# Patient Record
Sex: Male | Born: 1960
Health system: Southern US, Community
[De-identification: ages and names within clinical notes are randomized; demographics above are authoritative.]

## PROBLEM LIST (undated history)

## (undated) DIAGNOSIS — D649 Anemia, unspecified: Secondary | ICD-10-CM

## (undated) DIAGNOSIS — R011 Cardiac murmur, unspecified: Secondary | ICD-10-CM

## (undated) DIAGNOSIS — I2510 Atherosclerotic heart disease of native coronary artery without angina pectoris: Secondary | ICD-10-CM

## (undated) DIAGNOSIS — E119 Type 2 diabetes mellitus without complications: Secondary | ICD-10-CM

## (undated) DIAGNOSIS — M109 Gout, unspecified: Secondary | ICD-10-CM

## (undated) DIAGNOSIS — I219 Acute myocardial infarction, unspecified: Secondary | ICD-10-CM

## (undated) DIAGNOSIS — T7840XA Allergy, unspecified, initial encounter: Secondary | ICD-10-CM

## (undated) DIAGNOSIS — I10 Essential (primary) hypertension: Secondary | ICD-10-CM

## (undated) DIAGNOSIS — F419 Anxiety disorder, unspecified: Secondary | ICD-10-CM

## (undated) DIAGNOSIS — I639 Cerebral infarction, unspecified: Secondary | ICD-10-CM

## (undated) DIAGNOSIS — K219 Gastro-esophageal reflux disease without esophagitis: Secondary | ICD-10-CM

## (undated) DIAGNOSIS — E7800 Pure hypercholesterolemia, unspecified: Secondary | ICD-10-CM

## (undated) DIAGNOSIS — M7120 Synovial cyst of popliteal space [Baker], unspecified knee: Secondary | ICD-10-CM

## (undated) DIAGNOSIS — Z5189 Encounter for other specified aftercare: Secondary | ICD-10-CM

## (undated) HISTORY — PX: KNEE ARTHROSCOPY: SHX127

## (undated) HISTORY — DX: Cardiac murmur, unspecified: R01.1

## (undated) HISTORY — DX: Anxiety disorder, unspecified: F41.9

## (undated) HISTORY — DX: Encounter for other specified aftercare: Z51.89

## (undated) HISTORY — PX: FINGER AMPUTATION: SHX636

## (undated) HISTORY — DX: Allergy, unspecified, initial encounter: T78.40XA

---

## 1998-01-07 ENCOUNTER — Encounter: Admission: RE | Admit: 1998-01-07 | Discharge: 1998-01-07 | Payer: Self-pay | Admitting: Internal Medicine

## 1998-04-20 ENCOUNTER — Emergency Department (HOSPITAL_COMMUNITY): Admission: EM | Admit: 1998-04-20 | Discharge: 1998-04-20 | Payer: Self-pay | Admitting: Emergency Medicine

## 1999-10-11 ENCOUNTER — Emergency Department (HOSPITAL_COMMUNITY): Admission: EM | Admit: 1999-10-11 | Discharge: 1999-10-11 | Payer: Self-pay | Admitting: Emergency Medicine

## 2001-06-26 ENCOUNTER — Encounter: Payer: Self-pay | Admitting: Emergency Medicine

## 2001-06-26 ENCOUNTER — Emergency Department (HOSPITAL_COMMUNITY): Admission: EM | Admit: 2001-06-26 | Discharge: 2001-06-26 | Payer: Self-pay | Admitting: Emergency Medicine

## 2002-09-06 HISTORY — PX: COLONOSCOPY: SHX174

## 2003-02-27 ENCOUNTER — Emergency Department (HOSPITAL_COMMUNITY): Admission: EM | Admit: 2003-02-27 | Discharge: 2003-02-28 | Payer: Self-pay | Admitting: Emergency Medicine

## 2003-02-27 ENCOUNTER — Encounter: Payer: Self-pay | Admitting: Emergency Medicine

## 2003-03-04 ENCOUNTER — Emergency Department (HOSPITAL_COMMUNITY): Admission: EM | Admit: 2003-03-04 | Discharge: 2003-03-04 | Payer: Self-pay | Admitting: Emergency Medicine

## 2003-09-23 ENCOUNTER — Emergency Department (HOSPITAL_COMMUNITY): Admission: EM | Admit: 2003-09-23 | Discharge: 2003-09-23 | Payer: Self-pay | Admitting: Emergency Medicine

## 2003-09-24 ENCOUNTER — Emergency Department (HOSPITAL_COMMUNITY): Admission: EM | Admit: 2003-09-24 | Discharge: 2003-09-25 | Payer: Self-pay | Admitting: Emergency Medicine

## 2007-02-01 ENCOUNTER — Ambulatory Visit: Payer: Self-pay | Admitting: *Deleted

## 2007-05-01 ENCOUNTER — Ambulatory Visit: Payer: Self-pay | Admitting: Family Medicine

## 2007-05-01 ENCOUNTER — Encounter (INDEPENDENT_AMBULATORY_CARE_PROVIDER_SITE_OTHER): Payer: Self-pay | Admitting: Nurse Practitioner

## 2007-05-01 LAB — CONVERTED CEMR LAB
AST: 30 units/L (ref 0–37)
Albumin: 4.6 g/dL (ref 3.5–5.2)
Alkaline Phosphatase: 67 units/L (ref 39–117)
Basophils Absolute: 0 10*3/uL (ref 0.0–0.1)
Basophils Relative: 1 % (ref 0–1)
Chloride: 102 meq/L (ref 96–112)
LDL Cholesterol: 183 mg/dL — ABNORMAL HIGH (ref 0–99)
Lymphocytes Relative: 54 % — ABNORMAL HIGH (ref 12–46)
MCHC: 33.7 g/dL (ref 30.0–36.0)
Monocytes Relative: 10 % (ref 3–11)
Neutro Abs: 2.1 10*3/uL (ref 1.7–7.7)
Neutrophils Relative %: 33 % — ABNORMAL LOW (ref 43–77)
Potassium: 4.4 meq/L (ref 3.5–5.3)
RBC: 5.38 M/uL (ref 4.22–5.81)
RDW: 14.3 % — ABNORMAL HIGH (ref 11.5–14.0)
Sodium: 139 meq/L (ref 135–145)
TSH: 1.204 microintl units/mL (ref 0.350–5.50)
Total Protein: 7.8 g/dL (ref 6.0–8.3)

## 2007-05-15 DIAGNOSIS — Z9889 Other specified postprocedural states: Secondary | ICD-10-CM

## 2007-05-15 DIAGNOSIS — I10 Essential (primary) hypertension: Secondary | ICD-10-CM | POA: Insufficient documentation

## 2007-05-15 DIAGNOSIS — K219 Gastro-esophageal reflux disease without esophagitis: Secondary | ICD-10-CM | POA: Insufficient documentation

## 2007-05-16 ENCOUNTER — Encounter (INDEPENDENT_AMBULATORY_CARE_PROVIDER_SITE_OTHER): Payer: Self-pay | Admitting: Nurse Practitioner

## 2007-05-16 ENCOUNTER — Ambulatory Visit: Payer: Self-pay | Admitting: Internal Medicine

## 2007-05-16 DIAGNOSIS — H547 Unspecified visual loss: Secondary | ICD-10-CM

## 2007-05-16 DIAGNOSIS — M719 Bursopathy, unspecified: Secondary | ICD-10-CM

## 2007-05-16 DIAGNOSIS — M67919 Unspecified disorder of synovium and tendon, unspecified shoulder: Secondary | ICD-10-CM | POA: Insufficient documentation

## 2007-05-16 DIAGNOSIS — E7800 Pure hypercholesterolemia, unspecified: Secondary | ICD-10-CM

## 2007-05-30 ENCOUNTER — Ambulatory Visit: Payer: Self-pay | Admitting: Nurse Practitioner

## 2007-05-30 DIAGNOSIS — M25519 Pain in unspecified shoulder: Secondary | ICD-10-CM | POA: Insufficient documentation

## 2007-05-30 LAB — CONVERTED CEMR LAB
Cholesterol, target level: 200 mg/dL
HDL goal, serum: 40 mg/dL
LDL Goal: 130 mg/dL

## 2007-06-29 ENCOUNTER — Ambulatory Visit: Payer: Self-pay | Admitting: Nurse Practitioner

## 2007-06-29 DIAGNOSIS — F528 Other sexual dysfunction not due to a substance or known physiological condition: Secondary | ICD-10-CM | POA: Insufficient documentation

## 2007-08-01 ENCOUNTER — Ambulatory Visit: Payer: Self-pay | Admitting: Nurse Practitioner

## 2007-08-01 DIAGNOSIS — K5900 Constipation, unspecified: Secondary | ICD-10-CM | POA: Insufficient documentation

## 2007-10-02 ENCOUNTER — Ambulatory Visit: Payer: Self-pay | Admitting: Nurse Practitioner

## 2007-10-16 ENCOUNTER — Ambulatory Visit: Payer: Self-pay | Admitting: Nurse Practitioner

## 2008-07-08 ENCOUNTER — Ambulatory Visit: Payer: Self-pay | Admitting: Nurse Practitioner

## 2008-07-08 DIAGNOSIS — E669 Obesity, unspecified: Secondary | ICD-10-CM | POA: Insufficient documentation

## 2008-07-08 DIAGNOSIS — M79609 Pain in unspecified limb: Secondary | ICD-10-CM | POA: Insufficient documentation

## 2008-07-09 DIAGNOSIS — R7989 Other specified abnormal findings of blood chemistry: Secondary | ICD-10-CM | POA: Insufficient documentation

## 2008-07-09 LAB — CONVERTED CEMR LAB
ALT: 43 units/L (ref 0–53)
AST: 71 units/L — ABNORMAL HIGH (ref 0–37)
Basophils Absolute: 0 10*3/uL (ref 0.0–0.1)
Calcium: 9.7 mg/dL (ref 8.4–10.5)
Chloride: 98 meq/L (ref 96–112)
Creatinine, Ser: 0.99 mg/dL (ref 0.40–1.50)
Eosinophils Relative: 3 % (ref 0–5)
Lymphocytes Relative: 47 % — ABNORMAL HIGH (ref 12–46)
Neutro Abs: 2.9 10*3/uL (ref 1.7–7.7)
Neutrophils Relative %: 38 % — ABNORMAL LOW (ref 43–77)
Platelets: 278 10*3/uL (ref 150–400)
Potassium: 4.4 meq/L (ref 3.5–5.3)
RDW: 14.7 % (ref 11.5–15.5)
TSH: 1.227 microintl units/mL (ref 0.350–4.50)
Total CHOL/HDL Ratio: 14.1
Uric Acid, Serum: 10.4 mg/dL — ABNORMAL HIGH (ref 4.0–7.8)

## 2008-10-30 ENCOUNTER — Emergency Department (HOSPITAL_COMMUNITY): Admission: EM | Admit: 2008-10-30 | Discharge: 2008-10-30 | Payer: Self-pay | Admitting: Family Medicine

## 2008-11-28 ENCOUNTER — Ambulatory Visit: Payer: Self-pay | Admitting: Nurse Practitioner

## 2008-11-28 DIAGNOSIS — M25569 Pain in unspecified knee: Secondary | ICD-10-CM

## 2008-11-29 ENCOUNTER — Ambulatory Visit (HOSPITAL_COMMUNITY): Admission: RE | Admit: 2008-11-29 | Discharge: 2008-11-29 | Payer: Self-pay | Admitting: Nurse Practitioner

## 2008-11-29 LAB — CONVERTED CEMR LAB
ALT: 23 units/L (ref 0–53)
AST: 37 units/L (ref 0–37)
Basophils Absolute: 0 10*3/uL (ref 0.0–0.1)
Basophils Relative: 0 % (ref 0–1)
Creatinine, Ser: 1.32 mg/dL (ref 0.40–1.50)
MCHC: 34.4 g/dL (ref 30.0–36.0)
Neutro Abs: 3.5 10*3/uL (ref 1.7–7.7)
Neutrophils Relative %: 39 % — ABNORMAL LOW (ref 43–77)
Platelets: 298 10*3/uL (ref 150–400)
RBC: 4.86 M/uL (ref 4.22–5.81)
RDW: 14.4 % (ref 11.5–15.5)
Sed Rate: 27 mm/hr — ABNORMAL HIGH (ref 0–16)
Total Bilirubin: 0.3 mg/dL (ref 0.3–1.2)
Total CHOL/HDL Ratio: 11.3
Uric Acid, Serum: 11.6 mg/dL — ABNORMAL HIGH (ref 4.0–7.8)

## 2008-12-10 ENCOUNTER — Ambulatory Visit (HOSPITAL_COMMUNITY): Admission: RE | Admit: 2008-12-10 | Discharge: 2008-12-10 | Payer: Self-pay | Admitting: Family Medicine

## 2008-12-10 ENCOUNTER — Ambulatory Visit: Payer: Self-pay | Admitting: Family Medicine

## 2008-12-10 DIAGNOSIS — R109 Unspecified abdominal pain: Secondary | ICD-10-CM

## 2008-12-10 LAB — CONVERTED CEMR LAB: Blood Glucose, Fingerstick: 134

## 2009-01-08 ENCOUNTER — Observation Stay (HOSPITAL_COMMUNITY): Admission: EM | Admit: 2009-01-08 | Discharge: 2009-01-09 | Payer: Self-pay | Admitting: Emergency Medicine

## 2009-01-08 ENCOUNTER — Ambulatory Visit: Payer: Self-pay | Admitting: Vascular Surgery

## 2009-01-08 ENCOUNTER — Encounter (INDEPENDENT_AMBULATORY_CARE_PROVIDER_SITE_OTHER): Payer: Self-pay | Admitting: Emergency Medicine

## 2009-07-03 ENCOUNTER — Telehealth (INDEPENDENT_AMBULATORY_CARE_PROVIDER_SITE_OTHER): Payer: Self-pay | Admitting: Nurse Practitioner

## 2009-10-01 ENCOUNTER — Ambulatory Visit: Payer: Self-pay | Admitting: Nurse Practitioner

## 2009-10-01 DIAGNOSIS — L723 Sebaceous cyst: Secondary | ICD-10-CM

## 2009-10-14 ENCOUNTER — Ambulatory Visit: Payer: Self-pay | Admitting: Internal Medicine

## 2009-10-14 ENCOUNTER — Encounter (INDEPENDENT_AMBULATORY_CARE_PROVIDER_SITE_OTHER): Payer: Self-pay | Admitting: Nurse Practitioner

## 2009-10-14 DIAGNOSIS — M109 Gout, unspecified: Secondary | ICD-10-CM

## 2009-10-14 LAB — CONVERTED CEMR LAB
Alkaline Phosphatase: 76 units/L (ref 39–117)
Basophils Absolute: 0 10*3/uL (ref 0.0–0.1)
Basophils Relative: 0 % (ref 0–1)
Cholesterol: 201 mg/dL — ABNORMAL HIGH (ref 0–200)
Creatinine, Ser: 1.15 mg/dL (ref 0.40–1.50)
Eosinophils Absolute: 0.2 10*3/uL (ref 0.0–0.7)
Glucose, Bld: 115 mg/dL — ABNORMAL HIGH (ref 70–99)
HDL: 36 mg/dL — ABNORMAL LOW (ref >39)
Hemoglobin: 14.9 g/dL (ref 13.0–17.0)
LDL Cholesterol: 111 mg/dL — ABNORMAL HIGH (ref 0–99)
MCHC: 35.1 g/dL (ref 30.0–36.0)
MCV: 86.9 fL (ref 78.0–100.0)
Monocytes Absolute: 0.6 10*3/uL (ref 0.1–1.0)
Neutro Abs: 2.7 10*3/uL (ref 1.7–7.7)
Neutrophils Relative %: 38 % — ABNORMAL LOW (ref 43–77)
RDW: 13.5 % (ref 11.5–15.5)
Sodium: 137 meq/L (ref 135–145)
Total Bilirubin: 0.5 mg/dL (ref 0.3–1.2)
Total CHOL/HDL Ratio: 5.6
Total Protein: 8 g/dL (ref 6.0–8.3)
Triglycerides: 271 mg/dL — ABNORMAL HIGH (ref <150)
VLDL: 54 mg/dL — ABNORMAL HIGH (ref 0–40)

## 2010-01-02 ENCOUNTER — Encounter (INDEPENDENT_AMBULATORY_CARE_PROVIDER_SITE_OTHER): Payer: Self-pay | Admitting: Nurse Practitioner

## 2010-04-08 ENCOUNTER — Ambulatory Visit: Payer: Self-pay | Admitting: Nurse Practitioner

## 2010-04-09 ENCOUNTER — Ambulatory Visit: Payer: Self-pay | Admitting: Nurse Practitioner

## 2010-04-09 LAB — CONVERTED CEMR LAB
AST: 34 units/L (ref 0–37)
Albumin: 4.5 g/dL (ref 3.5–5.2)
Alkaline Phosphatase: 63 units/L (ref 39–117)
BUN: 25 mg/dL — ABNORMAL HIGH (ref 6–23)
Blood in Urine, dipstick: NEGATIVE
Creatinine, Ser: 1.1 mg/dL (ref 0.40–1.50)
Glucose, Bld: 100 mg/dL — ABNORMAL HIGH (ref 70–99)
HDL: 39 mg/dL — ABNORMAL LOW (ref >39)
LDL Cholesterol: 109 mg/dL — ABNORMAL HIGH (ref 0–99)
Nitrite: NEGATIVE
Specific Gravity, Urine: 1.015
Total CHOL/HDL Ratio: 4.6
Triglycerides: 161 mg/dL — ABNORMAL HIGH (ref <150)
Uric Acid, Serum: 5.6 mg/dL (ref 4.0–7.8)
Urobilinogen, UA: 0.2
WBC Urine, dipstick: NEGATIVE

## 2010-04-10 ENCOUNTER — Encounter (INDEPENDENT_AMBULATORY_CARE_PROVIDER_SITE_OTHER): Payer: Self-pay | Admitting: Nurse Practitioner

## 2010-05-08 ENCOUNTER — Encounter (INDEPENDENT_AMBULATORY_CARE_PROVIDER_SITE_OTHER): Payer: Self-pay | Admitting: Nurse Practitioner

## 2010-05-27 ENCOUNTER — Emergency Department (HOSPITAL_COMMUNITY): Admission: EM | Admit: 2010-05-27 | Discharge: 2010-05-27 | Payer: Self-pay | Admitting: Emergency Medicine

## 2010-06-12 ENCOUNTER — Encounter (INDEPENDENT_AMBULATORY_CARE_PROVIDER_SITE_OTHER): Payer: Self-pay | Admitting: Nurse Practitioner

## 2010-06-30 ENCOUNTER — Emergency Department (HOSPITAL_COMMUNITY): Admission: EM | Admit: 2010-06-30 | Discharge: 2010-06-30 | Payer: Self-pay | Admitting: Family Medicine

## 2010-06-30 ENCOUNTER — Telehealth (INDEPENDENT_AMBULATORY_CARE_PROVIDER_SITE_OTHER): Payer: Self-pay | Admitting: Nurse Practitioner

## 2010-07-06 ENCOUNTER — Encounter (INDEPENDENT_AMBULATORY_CARE_PROVIDER_SITE_OTHER): Payer: Self-pay | Admitting: Nurse Practitioner

## 2010-07-09 ENCOUNTER — Ambulatory Visit: Payer: Self-pay | Admitting: Nurse Practitioner

## 2010-07-09 DIAGNOSIS — J309 Allergic rhinitis, unspecified: Secondary | ICD-10-CM | POA: Insufficient documentation

## 2010-10-06 NOTE — Assessment & Plan Note (Signed)
Summary: [name] pt--possible cyst removal /tmm   Vital Signs:  Patient profile:   50 year old male Weight:      218 pounds Temp:     97.9 degrees F Pulse rate:   66 / minute Pulse rhythm:   regular Resp:     20 per minute BP sitting:   143 / 104  (left arm) Cuff size:   large  Vitals Entered By: Vesta Mixer CMA (October 14, 2009 12:33 PM) CC: possible cyst removal middle of his back, it has gone down since taking antibx.  FYI-He is in a lot of pain right now due to gout in his knees.  Does not have indomethacin Is Patient Diabetic? No Pain Assessment Patient in pain? yes     Location: knees Intensity: 10 Type: gouty  Does patient need assistance? Ambulation Impaired:Risk for fall   CC:  possible cyst removal middle of his back and it has gone down since taking antibx.  FYI-He is in a lot of pain right now due to gout in his knees.  Does not have indomethacin.  History of Present Illness: 1.  Infected sebaceous cyst on back:   much better now, not painful, but has not yet completed his antibiotic course.  Is taking Bactrim two times a day, suspect he has missed doses as should be done, however.  Thinks he has 4 days of antibiotic left.  2.  Bilateral knee swelling and pain.  Pain and swelling started last week.  Is taking Allopurinol 300 mg daily Indomethacin when has a flair only.  Did not bring Indomethacin, but states he has some at home.  Uric Acid levels in 10-11 range.  Allergies (verified): No Known Drug Allergies  Physical Exam  Extremities:  Bilateral knees with swelling and generalized tenderness, particularly suprapatellar and along joint margin.  No increased warmth.  No definite erythema, though difficult to assess.  Good ROM, just painful   Impression & Recommendations:  Problem # 1:  SEBACEOUS CYST (ICD-706.2) To complete antibiotic regimen and reappoint for removal With his current gout flair, I do not believe he can lie still for length of time  necessary for removal as well.  Problem # 2:  GOUT (ICD-274.9) Hold Allopurinol until pain and swelling resolved, then restart. His updated medication list for this problem includes:    Indomethacin 50 Mg Caps (Indomethacin) .Marland Kitchen... 1 capsule by mouth two times a day as needed for pain    Ibuprofen 800 Mg Tabs (Ibuprofen) ..... One tablet by mouth two times a day as needed for pain    Allopurinol 300 Mg Tabs (Allopurinol) .Marland Kitchen... 1 tablet by mouth daily for gout  Complete Medication List: 1)  Nexium 40 Mg Cpdr (Esomeprazole magnesium) .... One tablet by mouth daily for stomach 2)  Atenolol 100 Mg Tabs (Atenolol) .... One tablet by mouth two times a day for blood pressure  **note change in dose** 3)  Lisinopril-hydrochlorothiazide 20-25 Mg Tabs (Lisinopril-hydrochlorothiazide) .... One tablet by mouth daily for blood pressure 4)  Indomethacin 50 Mg Caps (Indomethacin) .Marland Kitchen.. 1 capsule by mouth two times a day as needed for pain 5)  Crestor 10 Mg Tabs (Rosuvastatin calcium) .Marland Kitchen.. 1 tablet by mouth nightly for cholesterol 6)  Lovaza 1 Gm Caps (Omega-3-acid ethyl esters) .... 2 capsules by mouth two times a day for cholesterol 7)  Ibuprofen 800 Mg Tabs (Ibuprofen) .... One tablet by mouth two times a day as needed for pain 8)  Allopurinol 300 Mg Tabs (  Allopurinol) .Marland Kitchen.. 1 tablet by mouth daily for gout 9)  Bactrim Ds 800-160 Mg Tabs (Sulfamethoxazole-trimethoprim) .... One tablet by mouth two times a day for infection  Patient Instructions: 1)  45 minute visit for sebaceous cyst removal on back 2)  Please call and cancel appt. if you develop a gouty flair or infection of the cyst--though we will see you to treat the infection--just call and let us know beforehand 3)  Stop the Allopurinol when you are having a flair of gout and start taking INdomethacin.  Do not take Naproxen on the days you take Indomethacin
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
## 2010-10-06 NOTE — Letter (Signed)
Summary: MAILED REQUESTED RECORDS TO [facility]  MAILED REQUESTED RECORDS TO [facility]   Imported By: Arta Bruce 05/08/2010 11:08:45  _____________________________________________________________________  External Attachment:    Type:   Image     Comment:   External Document

## 2010-10-06 NOTE — Letter (Signed)
Summary: Lipid Letter  [facility]  [address] [place], [place] [address]   Phone: [phone]  Fax: [phone]    04/10/2010  [name] [address] [place], [place]  [address]  Dear [name]:  We have carefully reviewed your last lipid profile from 04/09/2010 and the results are noted below with a summary of recommendations for lipid management.    Cholesterol:       180     Goal: less than 200   HDL "good" Cholesterol:   39     Goal: greater than 40   LDL "bad" Cholesterol:   109     Goal: less than 130   Triglycerides:       161     Goal: less than 150    Cholesterol labs are much improved. Continue current dose of medications.  Your kidney labs will continued to be monitored.  Be sure to drink plenty of water.    Current Medications: 1)    Nexium 40 Mg Cpdr (Esomeprazole magnesium) .... One tablet by mouth two times a day for stomach 2)    Atenolol 100 Mg Tabs (Atenolol) .... One tablet by mouth two times a day for blood pressure  **note change in dose** 3)    Lisinopril-hydrochlorothiazide 20-25 Mg  Tabs (Lisinopril-hydrochlorothiazide) .... One tablet by mouth daily for blood pressure 4)    Crestor 10 Mg Tabs (Rosuvastatin calcium) .Marland Kitchen.. 1 tablet by mouth nightly for cholesterol 5)    Lovaza 1 Gm Caps (Omega-3-acid ethyl esters) .... 2 capsules by mouth two times a day for cholesterol 6)    Ibuprofen 800 Mg Tabs (Ibuprofen) .... One tablet by mouth two times a day as needed for pain 7)    Allopurinol 300 Mg Tabs (Allopurinol) .Marland Kitchen.. 1 tablet by mouth daily for gout 8)    Amlodipine Besylate 5 Mg Tabs (Amlodipine besylate) .... One tablet by mouth daily for blood pressure  If you have any questions, please call. We appreciate being able to work with you.   Sincerely,    [facility] Lehman Prom FNP

## 2010-10-06 NOTE — Letter (Signed)
Summary: MAILED REQUESTED RECORDS TO [facility]  MAILED REQUESTED RECORDS TO [facility]   Imported By: Arta Bruce 01/02/2010 11:19:37  _____________________________________________________________________  External Attachment:    Type:   Image     Comment:   External Document

## 2010-10-06 NOTE — Progress Notes (Signed)
Summary: HAS GOUT  Phone Note Call from Patient Call back at [phone]   Summary of Call: [name]. MR Broadwell STOPPED BY FROM BEING SEEN AT [facility] FOR GOUT IN HIS BACK, AND WAS PRESCRIBED FLEXERIL, PREDNISONE  AND LORTAB, BUT HE TORE THEM UP AND SAYS THAT HE'S NOT GONNA TAKE  IT, BECAUSE ITS NOT GONING TO HELP. HE WANTS TO KNOW IF HE CAN GET PERCOCET UNTIL HE SEES YOU NEXT THURSDAY Initial call taken by: Leodis Rains,  June 30, 2010 12:33 PM  Follow-up for Phone Call        When at the window, Jones. was quite irate and said he wouldn't take those meds prescribed by the hospital.   Sent to N. Daphine Deutscher.  Dutch Quint RN  June 30, 2010 2:45 PM   Additional Follow-up for Phone Call Additional follow up Details #1::        I need ER visit -  One does not get GOUT in the back - perhaps he was unclear on DX or it may have been a combination of issues Additional Follow-up by: Lehman Prom FNP,  July 01, 2010 8:29 AM    Additional Follow-up for Phone Call Additional follow up Details #2::    ER notes on your desk.  Dutch Quint RN  July 01, 2010 11:42 AM  Looks like to was dx with gout and sciatica. He should take the prednisone as ordered as this will help with the inflammaton coming from his back down into his left leg. However, he was also given lortab and Jones has an allergy to vicodin.  Unsure nature of reaction to vicodin but he may have similar reaction to percocet as they are derivatives from the same family of drugs Will given him percocet 20 tabs until his visit here next week.  (he will need to come pick up Rx)  Lehman Prom FNP,  July 01, 2010 11:57 AM  Additional Follow-up for Phone Call Additional follow up Details #3:: Details for Additional Follow-up Action Taken: Left message on voicemail for Jones. to return call.  Dutch Quint RN  July 01, 2010 4:28 PM  Left message with Jones. SO for Jones. to return call or to speak with me when he comes to office.   Dutch Quint RN  July 02, 2010 4:41 PM  SO wanted to know information regarding call.  Explained privacy requirements, stated that if we weren't going to tell her what the calls were about, to stop calling her number.  Letter sent.  Dutch Quint RN  July 06, 2010 4:29 PM   New/Updated Medications: PERCOCET 5-325 MG TABS (OXYCODONE-ACETAMINOPHEN) One tablet by mouth two times a day as needed for pain Prescriptions: PERCOCET 5-325 MG TABS (OXYCODONE-ACETAMINOPHEN) One tablet by mouth two times a day as needed for pain  #20 x 0   Entered and Authorized by:   Lehman Prom FNP   Signed by:   Lehman Prom FNP on 07/01/2010   Method used:   Print then Give to Patient   RxID:   [id number]

## 2010-10-06 NOTE — Letter (Signed)
Summary: MAILED REQUESTED RECORDS TO [employer]  MAILED REQUESTED RECORDS TO [employer]   Imported By: Arta Bruce 06/12/2010 12:53:04  _____________________________________________________________________  External Attachment:    Type:   Image     Comment:   External Document

## 2010-10-06 NOTE — Assessment & Plan Note (Signed)
Summary: HTN/GOUT   Vital Signs:  Patient profile:   50 year old male Weight:      208.5 pounds BMI:     33.77 Temp:     97.2 degrees F oral Pulse rate:   70 / minute Pulse rhythm:   regular Resp:     16 per minute BP sitting:   100 / 80  (left arm) Cuff size:   large  Vitals Entered By: Levon Hedger (July 09, 2010 3:44 PM)  Nutrition Counseling: Patient's BMI is greater than 25 and therefore counseled on weight management options. CC: follow-up visit...needs something for gout, Hypertension Management, Lipid Management, Abdominal Pain Is Patient Diabetic? No Pain Assessment Patient in pain? yes       Does patient need assistance? Functional Status Self care Ambulation Normal   CC:  follow-up visit...needs something for gout, Hypertension Management, Lipid Management, and Abdominal Pain.  History of Present Illness:  Pt into the office for f/u on htn He has also been to the [facility] since his last visit here. (See previous phone note ER notes reviewed) Dx with Gout and Sciatica Back, hip and foot is affected when he has flares. At times he has inflammation of the elbow.   He continues to take allopurinol 300mg  by mouth daily as ordered He also has ibuprofen filled as ordered. When pain is at it's worst he has trouble doing his day to day activity - wears t-shirt to avoid buttons Also daughter assists at time with tying shoes Admits to use cane and walker at time due to pain in lower extremities  Obesity - weight down 3 pounds since last visit. Pt has tried to change his diet - no fried    Dyspepsia History:      There is a prior history of GERD.  The patient does not have a prior history of documented ulcer disease.    Hypertension History:      He denies headache, chest pain, and palpitations.  Bp is stable.        Positive major cardiovascular risk factors include male age 9 years old or older, hyperlipidemia, and hypertension.  Negative major  cardiovascular risk factors include negative family history for ischemic heart disease and non-tobacco-user status.        Further assessment for target organ damage reveals no history of ASHD, cardiac end-organ damage (CHF/LVH), stroke/TIA, peripheral vascular disease, renal insufficiency, or hypertensive retinopathy.    Lipid Management History:      Positive NCEP/ATP III risk factors include male age 75 years old or older, HDL cholesterol less than 40, and hypertension.  Negative NCEP/ATP III risk factors include no family history for ischemic heart disease, non-tobacco-user status, no ASHD (atherosclerotic heart disease), no prior stroke/TIA, no peripheral vascular disease, and no history of aortic aneurysm.        The patient states that he knows about the "Therapeutic Lifestyle Change" diet.  The patient does not know about adjunctive measures for cholesterol lowering.  He expresses no side effects from his lipid-lowering medication.  Comments include: Pt is taking medications as ordered.  The patient denies any symptoms to suggest myopathy or liver disease.      Habits & Providers  Alcohol-Tobacco-Diet     Alcohol drinks/day: 0     Tobacco Status: never     Passive Smoke Exposure: no  Exercise-Depression-Behavior     Does Patient Exercise: no     Have you felt down or hopeless? no  Have you felt little pleasure in things? no     Depression Counseling: not indicated; screening negative for depression     Drug Use: no     Seat Belt Use: 100     Sun Exposure: frequently  Allergies (verified): 1)  ! Vicodin  Review of Systems General:  Denies fever. Eyes:  Denies blurring. ENT:  Denies earache. CV:  Denies chest pain or discomfort. Resp:  Denies cough. GI:  Denies abdominal pain, nausea, and vomiting. MS:  Complains of joint pain, low back pain, and stiffness; Left shoulder, right elbow, right hand, back, bilateral hips, right knee and bilateral feet - pain. Neuro:  Denies  headaches. Psych:  Denies depression.  Physical Exam  General:  alert.   Head:  male-pattern balding.   Eyes:  pupils round.   Lungs:  normal breath sounds.   Heart:  normal rate and regular rhythm.   Abdomen:  normal bowel sounds.   Neurologic:  alert & oriented X3.     Impression & Recommendations:  Problem # 1:  HYPERTENSION (ICD-401.9) BP is stable Continue current medications His updated medication list for this problem includes:    Atenolol 100 Mg Tabs (Atenolol) ..... One tablet by mouth two times a day for blood pressure  **note change in dose**    Lisinopril-hydrochlorothiazide 20-25 Mg Tabs (Lisinopril-hydrochlorothiazide) ..... One tablet by mouth daily for blood pressure    Amlodipine Besylate 5 Mg Tabs (Amlodipine besylate) ..... One tablet by mouth daily for blood pressure  Problem # 2:  HYPERCHOLESTEROLEMIA (ICD-272.0) Will check labs on next visit His updated medication list for this problem includes:    Crestor 10 Mg Tabs (Rosuvastatin calcium) .Marland Kitchen... 1 tablet by mouth nightly for cholesterol    Lovaza 1 Gm Caps (Omega-3-acid ethyl esters) .Marland Kitchen... 2 capsules by mouth two times a day for cholesterol  Problem # 3:  HYPERURICEMIA (ICD-790.6) pt has frequent flares  Problem # 4:  OBESITY (ICD-278.00) weight down 3 pounds since last visit  Problem # 5:  NEED PROPHYLACTIC VACCINATION&INOCULATION FLU (ICD-V04.81) given today in office  Problem # 6:  ALLERGIC RHINITIS (ICD-477.9)  His updated medication list for this problem includes:    Loratadine 10 Mg Tabs (Loratadine) ..... One tablet by mouth daily for allergies  Complete Medication List: 1)  Nexium 40 Mg Cpdr (Esomeprazole magnesium) .... One tablet by mouth two times a day for stomach 2)  Atenolol 100 Mg Tabs (Atenolol) .... One tablet by mouth two times a day for blood pressure  **note change in dose** 3)  Lisinopril-hydrochlorothiazide 20-25 Mg Tabs (Lisinopril-hydrochlorothiazide) .... One tablet by  mouth daily for blood pressure 4)  Crestor 10 Mg Tabs (Rosuvastatin calcium) .Marland Kitchen.. 1 tablet by mouth nightly for cholesterol 5)  Lovaza 1 Gm Caps (Omega-3-acid ethyl esters) .... 2 capsules by mouth two times a day for cholesterol 6)  Ibuprofen 800 Mg Tabs (Ibuprofen) .... One tablet by mouth two times a day as needed for pain 7)  Allopurinol 300 Mg Tabs (Allopurinol) .Marland Kitchen.. 1 tablet by mouth daily for gout 8)  Amlodipine Besylate 5 Mg Tabs (Amlodipine besylate) .... One tablet by mouth daily for blood pressure 9)  Percocet 5-325 Mg Tabs (Oxycodone-acetaminophen) .... One tablet by mouth two times a day as needed for pain 10)  Loratadine 10 Mg Tabs (Loratadine) .... One tablet by mouth daily for allergies  Hypertension Assessment/Plan:      The patient's hypertensive risk group is category B: At least one risk factor (excluding  diabetes) with no target organ damage.  His calculated 10 year risk of coronary heart disease is 9 %.  Today's blood pressure is 100/80.  His blood pressure goal is < 140/90.  Lipid Assessment/Plan:      Based on NCEP/ATP III, the patient's risk factor category is "2 or more risk factors and a calculated 10 year CAD risk of < 20%".  The patient's lipid goals are as follows: Total cholesterol goal is 200; LDL cholesterol goal is 130; HDL cholesterol goal is 40; Triglyceride goal is 150.     Patient Instructions: 1)  You have been given the flu vaccine today. 2)  Blood pressure is doing great.  Continue current medications. 3)  Take pain medications as needed  4)  Follow up in 3 months for blood pressure or sooner if necessary Prescriptions: LORATADINE 10 MG TABS (LORATADINE) One tablet by mouth daily for allergies  #30 x 5   Entered and Authorized by:   Lehman Prom FNP   Signed by:   Lehman Prom FNP on 07/09/2010   Method used:   Faxed to ...       [facility] (retail)       [address] [place], [place]  [address]        Ph: [phone]       Fax: [phone]   RxID:   [id number]    Orders Added: 1)  Est. Patient Level IV [84132]    Prevention & Chronic Care Immunizations   Influenza vaccine: Not documented    Tetanus booster: Not documented    Pneumococcal vaccine: Not documented  Colorectal Screening   Hemoccult: Not documented    Colonoscopy: Not documented  Other Screening   PSA: 2.20  (10/14/2009)   Smoking status: never  (07/09/2010)  Lipids   Total Cholesterol: 180  (04/09/2010)   LDL: 109  (04/09/2010)   LDL Direct: Not documented   HDL: 39  (04/09/2010)   Triglycerides: 161  (04/09/2010)    SGOT (AST): 34  (04/09/2010)   SGPT (ALT): 17  (04/09/2010)   Alkaline phosphatase: 63  (04/09/2010)   Total bilirubin: 0.3  (04/09/2010)  Hypertension   Last Blood Pressure: 100 / 80  (07/09/2010)   Serum creatinine: 1.10  (04/09/2010)   Serum potassium 4.3  (04/09/2010)  Self-Management Support :    Hypertension self-management support: Not documented    Lipid self-management support: Not documented    Nursing Instructions: Give Flu vaccine today   Appended Document: HTN/GOUT     Allergies: 1)  ! Vicodin   Complete Medication List: 1)  Nexium 40 Mg Cpdr (Esomeprazole magnesium) .... One tablet by mouth two times a day for stomach 2)  Atenolol 100 Mg Tabs (Atenolol) .... One tablet by mouth two times a day for blood pressure  **note change in dose** 3)  Lisinopril-hydrochlorothiazide 20-25 Mg Tabs (Lisinopril-hydrochlorothiazide) .... One tablet by mouth daily for blood pressure 4)  Crestor 10 Mg Tabs (Rosuvastatin calcium) .Marland Kitchen.. 1 tablet by mouth nightly for cholesterol 5)  Lovaza 1 Gm Caps (Omega-3-acid ethyl esters) .... 2 capsules by mouth two times a day for cholesterol 6)  Ibuprofen 800 Mg Tabs (Ibuprofen) .... One tablet by mouth two times a day as needed for pain 7)  Allopurinol 300 Mg Tabs (Allopurinol) .Marland Kitchen.. 1 tablet by mouth daily for  gout 8)  Amlodipine Besylate 5 Mg Tabs (Amlodipine besylate) .... One tablet by mouth daily for blood  pressure 9)  Percocet 5-325 Mg Tabs (Oxycodone-acetaminophen) .... One tablet by mouth two times a day as needed for pain 10)  Loratadine 10 Mg Tabs (Loratadine) .... One tablet by mouth daily for allergies  Other Orders: Flu Vaccine 59yrs + (04540) Admin 1st Vaccine (98119)   Orders Added: 1)  Flu Vaccine 57yrs + [14782] 2)  Admin 1st Vaccine [95621]   Immunizations Administered:  Influenza Vaccine # 1:    Vaccine Type: Fluvax 3+    Site: right deltoid    Mfr: GlaxoSmithKline    Dose: 0.5 ml    Route: IM    Given by: Levon Hedger    Exp. Date: 03/06/2011    Lot #: [id number]    VIS given: 03/31/10 version given July 09, 2010.  Flu Vaccine Consent Questions:    Do you have a history of severe allergic reactions to this vaccine? no    Any prior history of allergic reactions to egg and/or gelatin? no    Do you have a sensitivity to the preservative Thimersol? no    Do you have a past history of Guillan-Barre Syndrome? no    Do you currently have an acute febrile illness? no    Have you ever had a severe reaction to latex? no    Vaccine information given and explained to patient? yes    [phone]  Immunizations Administered:  Influenza Vaccine # 1:    Vaccine Type: Fluvax 3+    Site: right deltoid    Mfr: GlaxoSmithKline    Dose: 0.5 ml    Route: IM    Given by: Levon Hedger    Exp. Date: 03/06/2011    Lot #: [id number]    VIS given: 03/31/10 version given July 09, 2010.

## 2010-10-06 NOTE — Assessment & Plan Note (Signed)
Summary: HTN   Vital Signs:  Patient profile:   50 year old male Weight:      211.4 pounds BMI:     34.24 Temp:     97.7 degrees F oral Pulse rate:   65 / minute Pulse rhythm:   regular Resp:     16 per minute BP sitting:   142 / 93  (left arm) Cuff size:   large  Vitals Entered By: Levon Hedger (April 08, 2010 10:07 AM)  Nutrition Counseling: Patient's BMI is greater than 25 and therefore counseled on weight management options. CC: medication refills...right knee pain for a while, Hypertension Management, Lipid Management, Abdominal Pain Is Patient Diabetic? No Pain Assessment Patient in pain? yes     Location: right knee Intensity: 8  Does patient need assistance? Functional Status Self care Ambulation Normal   CC:  medication refills...right knee pain for a while, Hypertension Management, Lipid Management, and Abdominal Pain.  History of Present Illness:  Pt into the office for f/u on htn and still with right knee pain.  Pt is NOT fasting today for labs.  Dyspepsia History:      He has no alarm features of dyspepsia including no history of melena, hematochezia, dysphagia, persistent vomiting, or involuntary weight loss > 5%.  There is a prior history of GERD.  The patient does not have a prior history of documented ulcer disease.  The dominant symptom is heartburn or acid reflux.  An H-2 blocker medication is currently being taken.  He notes that the symptoms have not improved with the H-2 blocker therapy.  Symptoms have not persisted after 4 weeks of H-2 blocker treatment.  He has no history of a positive H. Pylori serology.  No previous upper endoscopy has been done.    Hypertension History:      He denies headache, chest pain, and palpitations.        Positive major cardiovascular risk factors include male age 80 years old or older, hyperlipidemia, and hypertension.  Negative major cardiovascular risk factors include negative family history for ischemic heart  disease and non-tobacco-user status.        Further assessment for target organ damage reveals no history of ASHD, cardiac end-organ damage (CHF/LVH), stroke/TIA, peripheral vascular disease, renal insufficiency, or hypertensive retinopathy.    Lipid Management History:      Positive NCEP/ATP III risk factors include male age 56 years old or older, HDL cholesterol less than 40, and hypertension.  Negative NCEP/ATP III risk factors include no family history for ischemic heart disease, non-tobacco-user status, no ASHD (atherosclerotic heart disease), no prior stroke/TIA, no peripheral vascular disease, and no history of aortic aneurysm.        The patient states that he knows about the "Therapeutic Lifestyle Change" diet.  The patient does not know about adjunctive measures for cholesterol lowering.  He expresses no side effects from his lipid-lowering medication.       Medications Prior to Update: 1)  Nexium 40 Mg  Cpdr (Esomeprazole Magnesium) .... One Tablet By Mouth Daily For Stomach 2)  Atenolol 100 Mg Tabs (Atenolol) .... One Tablet By Mouth Two Times A Day For Blood Pressure  **note Change in Dose** 3)  Lisinopril-Hydrochlorothiazide 20-25 Mg  Tabs (Lisinopril-Hydrochlorothiazide) .... One Tablet By Mouth Daily For Blood Pressure 4)  Indomethacin 50 Mg Caps (Indomethacin) .Marland Kitchen.. 1 Capsule By Mouth Two Times A Day As Needed For Pain 5)  Crestor 10 Mg Tabs (Rosuvastatin Calcium) .Marland Kitchen.. 1 Tablet By  Mouth Nightly For Cholesterol 6)  Lovaza 1 Gm Caps (Omega-3-Acid Ethyl Esters) .... 2 Capsules By Mouth Two Times A Day For Cholesterol 7)  Ibuprofen 800 Mg Tabs (Ibuprofen) .... One Tablet By Mouth Two Times A Day As Needed For Pain 8)  Allopurinol 300 Mg Tabs (Allopurinol) .Marland Kitchen.. 1 Tablet By Mouth Daily For Gout 9)  Bactrim Ds 800-160 Mg Tabs (Sulfamethoxazole-Trimethoprim) .... One Tablet By Mouth Two Times A Day For Infection  Current Medications (verified): 1)  Nexium 40 Mg Cpdr (Esomeprazole  Magnesium) .... One Tablet By Mouth Two Times A Day For Stomach 2)  Atenolol 100 Mg Tabs (Atenolol) .... One Tablet By Mouth Two Times A Day For Blood Pressure  **note Change in Dose** 3)  Lisinopril-Hydrochlorothiazide 20-25 Mg  Tabs (Lisinopril-Hydrochlorothiazide) .... One Tablet By Mouth Daily For Blood Pressure 4)  Crestor 10 Mg Tabs (Rosuvastatin Calcium) .Marland Kitchen.. 1 Tablet By Mouth Nightly For Cholesterol 5)  Lovaza 1 Gm Caps (Omega-3-Acid Ethyl Esters) .... 2 Capsules By Mouth Two Times A Day For Cholesterol 6)  Ibuprofen 800 Mg Tabs (Ibuprofen) .... One Tablet By Mouth Two Times A Day As Needed For Pain 7)  Allopurinol 300 Mg Tabs (Allopurinol) .Marland Kitchen.. 1 Tablet By Mouth Daily For Gout 8)  Amlodipine Besylate 5 Mg Tabs (Amlodipine Besylate) .... One Tablet By Mouth Daily For Blood Pressure  Allergies (verified): 1)  ! Vicodin  Review of Systems General:  Denies fever. CV:  Denies chest pain or discomfort. Resp:  Complains of chest pain with inspiration; denies cough; pt reports that he fell a couple of weeks ago when his knee gave away and he landed on something hard.  since that time he has felt pain in his chest with deep breaths.. GI:  Complains of indigestion; denies abdominal pain, nausea, and vomiting. MS:  Complains of joint pain; right knee - continuous pain with swelling which requires him to use a cane . Derm:  Complains of lesion(s); mid - back ; s/p tick removal in April.  Area is still not healed..  Physical Exam  General:  alert.   Head:  normocephalic.   Lungs:  normal breath sounds.   Heart:  normal rate and regular rhythm.   Skin:  lower back - .5mm raised cratered papule Psych:  Oriented X3.     Knee Exam  Knee Exam:    Right:    Inspection:  Abnormal    Palpation:  Abnormal       Location:  patellar    Stability:  stable    Tenderness:  no    Swelling:  diffuse    Erythema:  diffuse   Impression & Recommendations:  Problem # 1:  HYPERTENSION  (ICD-401.9) BP is still elevated. will add amlodipine 5mg  by mouth daily His updated medication list for this problem includes:    Atenolol 100 Mg Tabs (Atenolol) ..... One tablet by mouth two times a day for blood pressure  **note change in dose**    Lisinopril-hydrochlorothiazide 20-25 Mg Tabs (Lisinopril-hydrochlorothiazide) ..... One tablet by mouth daily for blood pressure    Amlodipine Besylate 5 Mg Tabs (Amlodipine besylate) ..... One tablet by mouth daily for blood pressure  Orders: T-Urine Microalbumin w/creat. ratio [phone])  Problem # 2:  HYPERCHOLESTEROLEMIA (ICD-272.0) pt is not fasting today will check labs His updated medication list for this problem includes:    Crestor 10 Mg Tabs (Rosuvastatin calcium) .Marland Kitchen... 1 tablet by mouth nightly for cholesterol    Lovaza 1 Gm Caps (Omega-3-acid  ethyl esters) .Marland Kitchen... 2 capsules by mouth two times a day for cholesterol  Orders: T-Lipid Profile ([id number]) T-Hepatic Function (21308-65784) T-TSH [phone])  Problem # 3:  KNEE PAIN, RIGHT (ICD-719.46) chronic Pt uses assistive device - can at time previous x-rays done will order anti-inflammatories The following medications were removed from the medication list:    Indomethacin 50 Mg Caps (Indomethacin) .Marland Kitchen... 1 capsule by mouth two times a day as needed for pain His updated medication list for this problem includes:    Ibuprofen 800 Mg Tabs (Ibuprofen) ..... One tablet by mouth two times a day as needed for pain  Problem # 4:  GOUT (ICD-274.9) ongoing - will check uric acid with labs tomorrow The following medications were removed from the medication list:    Indomethacin 50 Mg Caps (Indomethacin) .Marland Kitchen... 1 capsule by mouth two times a day as needed for pain His updated medication list for this problem includes:    Ibuprofen 800 Mg Tabs (Ibuprofen) ..... One tablet by mouth two times a day as needed for pain    Allopurinol 300 Mg Tabs (Allopurinol) .Marland Kitchen... 1 tablet by  mouth daily for gout  Complete Medication List: 1)  Nexium 40 Mg Cpdr (Esomeprazole magnesium) .... One tablet by mouth two times a day for stomach 2)  Atenolol 100 Mg Tabs (Atenolol) .... One tablet by mouth two times a day for blood pressure  **note change in dose** 3)  Lisinopril-hydrochlorothiazide 20-25 Mg Tabs (Lisinopril-hydrochlorothiazide) .... One tablet by mouth daily for blood pressure 4)  Crestor 10 Mg Tabs (Rosuvastatin calcium) .Marland Kitchen.. 1 tablet by mouth nightly for cholesterol 5)  Lovaza 1 Gm Caps (Omega-3-acid ethyl esters) .... 2 capsules by mouth two times a day for cholesterol 6)  Ibuprofen 800 Mg Tabs (Ibuprofen) .... One tablet by mouth two times a day as needed for pain 7)  Allopurinol 300 Mg Tabs (Allopurinol) .Marland Kitchen.. 1 tablet by mouth daily for gout 8)  Amlodipine Besylate 5 Mg Tabs (Amlodipine besylate) .... One tablet by mouth daily for blood pressure  Other Orders: T-Uric Acid (Blood) [id number])  Hypertension Assessment/Plan:      The patient's hypertensive risk group is category B: At least one risk factor (excluding diabetes) with no target organ damage.  His calculated 10 year risk of coronary heart disease is 14 %.  Today's blood pressure is 142/93.  His blood pressure goal is < 140/90.  Lipid Assessment/Plan:      Based on NCEP/ATP III, the patient's risk factor category is "2 or more risk factors and a calculated 10 year CAD risk of < 20%".  The patient's lipid goals are as follows: Total cholesterol goal is 200; LDL cholesterol goal is 130; HDL cholesterol goal is 40; Triglyceride goal is 150.    Patient Instructions: 1)  Return to this office in the morning for fasting labs. 2)  Do not eat after midnight before this visit. 3)  Blood pressure - still elevated.  will add norvasc 5mg  by mouth daily to current blood pressure medication.  This prescription has been sent to the pharmacy. 4)  The area on the lower back where the tick bit you is scabbed.  Apply the  ointment twice per day. 5)  Follow up in 3 months for high blood pressure. 6)  Follow up on tomorrow for fasting lab visit (no charge) Prescriptions: IBUPROFEN 800 MG TABS (IBUPROFEN) One tablet by mouth two times a day as needed for pain  #60 x 1   Entered and  Authorized by:   Lehman Prom FNP   Signed by:   Lehman Prom FNP on 04/08/2010   Method used:   Faxed to ...       [facility] - [facility] (retail)       [address] [place], [place]  [address]       Ph: [phone]       Fax: [phone]   RxID:   [id number] AMLODIPINE BESYLATE 5 MG TABS (AMLODIPINE BESYLATE) One tablet by mouth daily for blood pressure  #30 x 5   Entered and Authorized by:   Lehman Prom FNP   Signed by:   Lehman Prom FNP on 04/08/2010   Method used:   Faxed to ...       [facility] - Pharmac (retail)       [address] [place], [place]  [address]       Ph: [phone]       Fax: [phone]   RxID:   [id number] NEXIUM 40 MG CPDR (ESOMEPRAZOLE MAGNESIUM) One tablet by mouth two times a day for stomach  #60 x 5   Entered and Authorized by:   Lehman Prom FNP   Signed by:   Lehman Prom FNP on 04/08/2010   Method used:   Faxed to ...       [facility] - Pharmac (retail)       [address] [place], [place]  [address]       Ph: [phone]       Fax: [phone]   RxID:   [id number]

## 2010-10-06 NOTE — Letter (Signed)
Summary: Generic Letter  [facility]  [address] [place], [place] [address]   Phone: [phone]  Fax: [phone]    07/06/2010  MAXIMOS ZAYAS [address] [place], [place]  [address]  Dear Mr. Pinkhasov,  We have been unable to speak with you by telephone.  Please call our office, at your earliest convenience, so that we may speak with you regarding your recent visit to our office.  Sincerely,   Dutch Quint RN

## 2010-10-09 ENCOUNTER — Ambulatory Visit: Admit: 2010-10-09 | Payer: Self-pay | Admitting: Nurse Practitioner

## 2010-10-09 ENCOUNTER — Telehealth (INDEPENDENT_AMBULATORY_CARE_PROVIDER_SITE_OTHER): Payer: Self-pay | Admitting: Nurse Practitioner

## 2010-10-09 ENCOUNTER — Encounter: Payer: Self-pay | Admitting: Nurse Practitioner

## 2010-10-14 NOTE — Assessment & Plan Note (Signed)
Summary: Joint pain/Gout   Vital Signs:  Patient profile:   50 year old male Weight:      213.8 pounds Temp:     97.0 degrees F oral Pulse rate:   76 / minute Pulse rhythm:   regular Resp:     20 per minute BP sitting:   130 / 110  (left arm) Cuff size:   large  Vitals Entered By: Levon Hedger (October 09, 2010 8:45 AM) CC: follow-up visit...pain in feet x 3-4 weeks he thinks it is the gout, Hypertension Management, Lipid Management, Abdominal Pain Is Patient Diabetic? No Pain Assessment Patient in pain? yes     Location: feet Intensity: 8-9  Does patient need assistance? Functional Status Self care Ambulation Normal   CC:  follow-up visit...pain in feet x 3-4 weeks he thinks it is the gout, Hypertension Management, Lipid Management, and Abdominal Pain.  History of Present Illness:  Pt into the office for f/u on chronic issues. Pt has pain in multiple joints and he also has gout. Pain is joints is chronic.  Pt has chronic pain in shoulders currently L>R Unable to rest at night due to pain.  Feet are in constant pain - wearing bedroom shoes today in the office because gout has affected his toes so much that he is unable to put on shoes. Assistance device - cane use  Right lower back - still with area that is draining.  Started about April 2011 and pt was put on antibiotics without resolution of the symptoms.  Painful at times and area does get larger but does not completely resolve.  Anticoagulation Management History:      Negative risk factors for bleeding include an age less than 60 years old and no history of CVA/TIA.  The bleeding index is 'low risk'.  Positive CHADS2 values include History of HTN.  Negative CHADS2 values include Age > 14 years old and Prior Stroke/CVA/TIA.    Dyspepsia History:      He has no alarm features of dyspepsia including no history of melena, hematochezia, dysphagia, persistent vomiting, or involuntary weight loss > 5%.  There is a  prior history of GERD.  The patient does not have a prior history of documented ulcer disease.  The dominant symptom is not heartburn or acid reflux.  An H-2 blocker medication is not currently being taken.    Hypertension History:      He denies headache, chest pain, and palpitations.  Pt has not been able to get his BP meds from the pharmacy due to cost.  The prescriptions are at [facility] pharmacy but pt does not have money to pick up for next month.        Positive major cardiovascular risk factors include male age 11 years old or older, hyperlipidemia, and hypertension.  Negative major cardiovascular risk factors include negative family history for ischemic heart disease and non-tobacco-user status.        Further assessment for target organ damage reveals no history of ASHD, cardiac end-organ damage (CHF/LVH), stroke/TIA, peripheral vascular disease, renal insufficiency, or hypertensive retinopathy.    Lipid Management History:      Positive NCEP/ATP III risk factors include male age 104 years old or older, HDL cholesterol less than 40, and hypertension.  Negative NCEP/ATP III risk factors include no family history for ischemic heart disease, non-tobacco-user status, no ASHD (atherosclerotic heart disease), no prior stroke/TIA, no peripheral vascular disease, and no history of aortic aneurysm.  The patient states that he knows about the "Therapeutic Lifestyle Change" diet.  The patient does not know about adjunctive measures for cholesterol lowering.  He expresses no side effects from his lipid-lowering medication.  The patient denies any symptoms to suggest myopathy or liver disease.        Allergies (verified): 1)  ! Vicodin  Review of Systems General:  Complains of sleep disorder; denies fever. CV:  Denies chest pain or discomfort. Resp:  Denies cough. GI:  Denies abdominal pain, nausea, and vomiting. MS:  Complains of joint pain; left shoulder, right knee, bilateral great  toes. Neuro:  Complains of weakness; right lower extremity.  Physical Exam  General:  alert.   Head:  bald head Lungs:  normal breath sounds.   Heart:  normal rate and regular rhythm.   Abdomen:  normal bowel sounds.   Msk:  decreased ROM up to the exam table but slow  Neurologic:  cane use Skin:  color normal.   Psych:  Oriented X3.     Knee Exam  General:    average weight.    Knee Exam:    Right:    Inspection:  Abnormal    Stability:  stable    Tenderness:  no    Swelling:  no    Erythema:  no    Left:    Inspection:  Abnormal    Stability:  stable    Tenderness:  no    Swelling:  no    Erythema:  no    arthritic changes   Shoulder/Elbow Exam  General:    obese.    Shoulder Exam:    Right:    Inspection:  Normal    Palpation:  Normal       Location:  left AC joint    Stability:  stable    Tenderness:  no    Swelling:  no    Erythema:  no    Left:    Inspection:  Normal       Location:  right AC joint    Stability:  stable    Tenderness:  no    Swelling:  no    Erythema:  no    Range of Motion:       Flexion-Active: 90   Detailed Back/Spine Exam  Cervical Exam:  Inspection-deformity:    Normal Palpation-spinal tenderness:  Abnormal    Location:  C4-C5   Impression & Recommendations:  Problem # 1:  HYPERTENSION (ICD-401.9) ongoing with pt's BP  may need to increase amlodipine but most important pt needs to be sure he finds the funds to continue meds pain does largely impact his BP as he is in a great deal of pain today His updated medication list for this problem includes:    Atenolol 100 Mg Tabs (Atenolol) ..... One tablet by mouth two times a day for blood pressure  **note change in dose**    Lisinopril-hydrochlorothiazide 20-25 Mg Tabs (Lisinopril-hydrochlorothiazide) ..... One tablet by mouth daily for blood pressure    Amlodipine Besylate 5 Mg Tabs (Amlodipine besylate) ..... One tablet by mouth daily for blood  pressure  Problem # 2:  GERD (ICD-530.81) stable His updated medication list for this problem includes:    Nexium 40 Mg Cpdr (Esomeprazole magnesium) ..... One tablet by mouth two times a day for stomach  Problem # 3:  HYPERCHOLESTEROLEMIA (ICD-272.0) continue current meds His updated medication list for this problem includes:    Crestor 10 Mg Tabs (Rosuvastatin calcium) .Marland KitchenMarland KitchenMarland KitchenMarland Kitchen  1 tablet by mouth nightly for cholesterol    Lovaza 1 Gm Caps (Omega-3-acid ethyl esters) .Marland Kitchen... 2 capsules by mouth two times a day for cholesterol  Problem # 4:  GOUT (ICD-274.9) ongoing issue and to the point of debilitating for pt ambulation if difficult as bilateral great toes are affected His updated medication list for this problem includes:    Ibuprofen 800 Mg Tabs (Ibuprofen) ..... One tablet by mouth two times a day as needed for pain    Allopurinol 300 Mg Tabs (Allopurinol) .Marland Kitchen... 1 tablet by mouth daily for gout  Problem # 5:  OBESITY (ICD-278.00) ongoing effort to lose weight limitations in activity make it difficult  Problem # 6:  SHOULDER PAIN, BILATERAL (ICD-719.41) pt to take Percocet only if really needed will likely need additional treatment for shoulders His updated medication list for this problem includes:    Ibuprofen 800 Mg Tabs (Ibuprofen) ..... One tablet by mouth two times a day as needed for pain    Percocet 5-325 Mg Tabs (Oxycodone-acetaminophen) ..... One tablet by mouth two times a day as needed for pain  Complete Medication List: 1)  Nexium 40 Mg Cpdr (Esomeprazole magnesium) .... One tablet by mouth two times a day for stomach 2)  Atenolol 100 Mg Tabs (Atenolol) .... One tablet by mouth two times a day for blood pressure  **note change in dose** 3)  Lisinopril-hydrochlorothiazide 20-25 Mg Tabs (Lisinopril-hydrochlorothiazide) .... One tablet by mouth daily for blood pressure 4)  Crestor 10 Mg Tabs (Rosuvastatin calcium) .Marland Kitchen.. 1 tablet by mouth nightly for cholesterol 5)  Lovaza  1 Gm Caps (Omega-3-acid ethyl esters) .... 2 capsules by mouth two times a day for cholesterol 6)  Ibuprofen 800 Mg Tabs (Ibuprofen) .... One tablet by mouth two times a day as needed for pain 7)  Allopurinol 300 Mg Tabs (Allopurinol) .Marland Kitchen.. 1 tablet by mouth daily for gout 8)  Amlodipine Besylate 5 Mg Tabs (Amlodipine besylate) .... One tablet by mouth daily for blood pressure 9)  Percocet 5-325 Mg Tabs (Oxycodone-acetaminophen) .... One tablet by mouth two times a day as needed for pain 10)  Loratadine 10 Mg Tabs (Loratadine) .... One tablet by mouth daily for allergies  Other Orders: Dermatology Referral (Derma)  Anticoagulation Management Assessment/Plan:             Current Anticoagulation Instructions: PT IS NOT ON COUMADIN  Hypertension Assessment/Plan:      The patient's hypertensive risk group is category B: At least one risk factor (excluding diabetes) with no target organ damage.  His calculated 10 year risk of coronary heart disease is 18 %.  Today's blood pressure is 130/110.  His blood pressure goal is < 140/90.  Lipid Assessment/Plan:      Based on NCEP/ATP III, the patient's risk factor category is "0-1 risk factors".  The patient's lipid goals are as follows: Total cholesterol goal is 200; LDL cholesterol goal is 130; HDL cholesterol goal is 40; Triglyceride goal is 150.    Patient Instructions: 1)  Follow up in 2 months for chronic issues. 2)  Skin - you will be referred to dermatology at [facility].  Get that appointment on Monday when you return to this office. 3)  Chronic joint pain - Continue to take the allopurinol for gout daily. 4)  Will give Percocet as needed for pain Prescriptions: PERCOCET 5-325 MG TABS (OXYCODONE-ACETAMINOPHEN) One tablet by mouth two times a day as needed for pain  #20 x 0   Entered  and Authorized by:   Lehman Prom FNP   Signed by:   Lehman Prom FNP on 10/09/2010   Method used:   Print then Give to Patient   RxID:    [id number]    Orders Added: 1)  Est. Patient Level IV [14782] 2)  Dermatology Referral [Derma]

## 2010-10-22 NOTE — Progress Notes (Signed)
Summary: Dermatology referral  Phone Note Outgoing Call   Summary of Call: refer to pt Dermatology Dennard Nip) - ? sebacous cyst to lower back ongoing and draining after about 6 months Initial call taken by: Lehman Prom FNP,  October 09, 2010 9:21 AM  Follow-up for Phone Call        PT HAS AN APPT 11-10-10 @ 5PM [facility] LVM TO PT TO CALL ME BACK  Follow-up by: Cheryll Dessert,  October 12, 2010 12:20 PM

## 2010-11-10 ENCOUNTER — Encounter (INDEPENDENT_AMBULATORY_CARE_PROVIDER_SITE_OTHER): Payer: Self-pay | Admitting: Nurse Practitioner

## 2010-11-14 ENCOUNTER — Emergency Department (HOSPITAL_COMMUNITY)
Admission: EM | Admit: 2010-11-14 | Discharge: 2010-11-14 | Disposition: A | Discharge status: Home / Self Care | Payer: Self-pay | Attending: Emergency Medicine | Admitting: Emergency Medicine

## 2010-11-14 DIAGNOSIS — R947 Abnormal results of other endocrine function studies: Secondary | ICD-10-CM | POA: Insufficient documentation

## 2010-11-14 DIAGNOSIS — I10 Essential (primary) hypertension: Secondary | ICD-10-CM | POA: Insufficient documentation

## 2010-11-14 DIAGNOSIS — M109 Gout, unspecified: Secondary | ICD-10-CM | POA: Insufficient documentation

## 2010-11-14 DIAGNOSIS — E7800 Pure hypercholesterolemia, unspecified: Secondary | ICD-10-CM | POA: Insufficient documentation

## 2010-11-17 NOTE — Letter (Signed)
Summary: [facility]   Imported By: Arta Bruce 11/12/2010 10:29:18  _____________________________________________________________________  External Attachment:    Type:   Image     Comment:   External Document

## 2010-11-17 NOTE — Letter (Signed)
Summary: [facility]   Imported By: Arta Bruce 11/12/2010 10:49:52  _____________________________________________________________________  External Attachment:    Type:   Image     Comment:   External Document

## 2010-11-19 LAB — BASIC METABOLIC PANEL
CO2: 25 mEq/L (ref 19–32)
Calcium: 9.3 mg/dL (ref 8.4–10.5)
Chloride: 105 mEq/L (ref 96–112)
Creatinine, Ser: 0.88 mg/dL (ref 0.4–1.5)
GFR calc Af Amer: >60 mL/min (ref >60)
Glucose, Bld: 121 mg/dL — ABNORMAL HIGH (ref 70–99)
Sodium: 139 mEq/L (ref 135–145)

## 2010-11-19 LAB — DIFFERENTIAL
Basophils Relative: 1 % (ref 0–1)
Eosinophils Absolute: 0.2 10*3/uL (ref 0.0–0.7)
Eosinophils Relative: 2 % (ref 0–5)
Lymphs Abs: 3 10*3/uL (ref 0.7–4.0)
Monocytes Relative: 10 % (ref 3–12)

## 2010-11-19 LAB — CBC
Hemoglobin: 11.9 g/dL — ABNORMAL LOW (ref 13.0–17.0)
MCH: 31.3 pg (ref 26.0–34.0)
MCHC: 34.5 g/dL (ref 30.0–36.0)
MCV: 90.6 fL (ref 78.0–100.0)
RBC: 3.8 MIL/uL — ABNORMAL LOW (ref 4.22–5.81)

## 2010-12-15 LAB — URINE MICROSCOPIC-ADD ON

## 2010-12-15 LAB — BASIC METABOLIC PANEL
BUN: 11 mg/dL (ref 6–23)
BUN: 8 mg/dL (ref 6–23)
BUN: 9 mg/dL (ref 6–23)
CO2: 19 mEq/L (ref 19–32)
Calcium: 8.6 mg/dL (ref 8.4–10.5)
Chloride: 105 mEq/L (ref 96–112)
Creatinine, Ser: 0.73 mg/dL (ref 0.4–1.5)
Creatinine, Ser: 0.78 mg/dL (ref 0.4–1.5)
GFR calc Af Amer: >60 mL/min (ref >60)
GFR calc non Af Amer: >60 mL/min (ref >60)
GFR calc non Af Amer: >60 mL/min (ref >60)
GFR calc non Af Amer: >60 mL/min (ref >60)
Glucose, Bld: 112 mg/dL — ABNORMAL HIGH (ref 70–99)
Glucose, Bld: 191 mg/dL — ABNORMAL HIGH (ref 70–99)
Potassium: 3.4 mEq/L — ABNORMAL LOW (ref 3.5–5.1)
Sodium: 140 mEq/L (ref 135–145)

## 2010-12-15 LAB — ETHANOL: Alcohol, Ethyl (B): <5 mg/dL (ref 0–10)

## 2010-12-15 LAB — POCT CARDIAC MARKERS
CKMB, poc: 2.9 ng/mL (ref 1.0–8.0)
Myoglobin, poc: 93.9 ng/mL (ref 12–200)

## 2010-12-15 LAB — RAPID URINE DRUG SCREEN, HOSP PERFORMED
Amphetamines: NOT DETECTED
Barbiturates: NOT DETECTED
Tetrahydrocannabinol: POSITIVE — AB

## 2010-12-15 LAB — CBC
HCT: 39 % (ref 39.0–52.0)
Hemoglobin: 13.7 g/dL (ref 13.0–17.0)
MCHC: 35.2 g/dL (ref 30.0–36.0)
MCV: 87.8 fL (ref 78.0–100.0)
Platelets: 254 10*3/uL (ref 150–400)
RDW: 14.1 % (ref 11.5–15.5)
RDW: 14.5 % (ref 11.5–15.5)
WBC: 8.6 10*3/uL (ref 4.0–10.5)

## 2010-12-15 LAB — DIFFERENTIAL
Basophils Absolute: 0 10*3/uL (ref 0.0–0.1)
Basophils Absolute: 0 10*3/uL (ref 0.0–0.1)
Eosinophils Absolute: 0.2 10*3/uL (ref 0.0–0.7)
Eosinophils Relative: 1 % (ref 0–5)
Eosinophils Relative: 2 % (ref 0–5)
Lymphocytes Relative: 28 % (ref 12–46)
Lymphocytes Relative: 45 % (ref 12–46)
Monocytes Absolute: 0.6 10*3/uL (ref 0.1–1.0)
Neutro Abs: 5.2 10*3/uL (ref 1.7–7.7)
Neutrophils Relative %: 61 % (ref 43–77)

## 2010-12-15 LAB — URINALYSIS, ROUTINE W REFLEX MICROSCOPIC
Ketones, ur: 15 mg/dL — AB
Specific Gravity, Urine: 1.012 (ref 1.005–1.030)
Urobilinogen, UA: 1 mg/dL (ref 0.0–1.0)

## 2010-12-15 LAB — MAGNESIUM: Magnesium: 1.9 mg/dL (ref 1.5–2.5)

## 2010-12-15 LAB — OSMOLALITY: Osmolality: 287 mOsm/kg (ref 275–300)

## 2010-12-15 LAB — LACTIC ACID, PLASMA: Lactic Acid, Venous: 1.5 mmol/L (ref 0.5–2.2)

## 2010-12-15 LAB — PHOSPHORUS: Phosphorus: 2.5 mg/dL (ref 2.3–4.6)

## 2011-01-19 NOTE — H&P (Signed)
NAMEJAYDYN, [name]               ACCOUNT NO.:  [id number]   MEDICAL RECORD NO.:  [id number]          PATIENT TYPE:  OBV   LOCATION:  5120                         FACILITY:  [facility]   PHYSICIAN:  Eduard Clos, MDDATE OF BIRTH:  05/31/1961   DATE OF ADMISSION:  01/08/2009  DATE OF DISCHARGE:                              HISTORY & PHYSICAL   CHIEF COMPLAINT:  Right knee pain.   HISTORY OF PRESENT ILLNESS:  [name] is a 50 year old male with past  medical history of hypertension, gout, hyperlipidemia who presents to  the ED for posterior right knee pain.  He states that it began about a  month ago.  However, it has been getting progressively worse.  His visit  to the ED was prompted by the fact he was in tremendous amount of pain  that was unbearable.  He states he could not even walk secondary to  increased pain with weightbearing.  Pain is located in the popliteal  fossa, described as a horrible pain and 10/10 in intensity initially.  This is worse with weightbearing.  He says this is similar to previous  pain that has resolved with colchicine and ibuprofen.  Furthermore, he  states that he has had a diagnosis of gout, although on questioning, he  has never had arthrocentesis for confirmation.   PAST MEDICAL HISTORY:  1. Hypertension.  2. Gout.  3. Hyperlipidemia.   MEDICATIONS:  Unobtainable.  The patient does not recall the names of  his medicines.  Furthermore, he obtains his medications at [facility], which is currently closed and cannot be confirmed.  However,  the ones that he does recall appear to be:  1. Allopurinol 100 mg p.o. daily.  2. Colchicine, I assume this is 0.6 mg p.o. daily p.r.n. gout flares.  3. Ibuprofen 800 mg p.o. t.i.d. p.r.n. gout flare.  4. Metoprolol unknown dose b.i.d.   ALLERGIES:  No known drug allergies.   REVIEW OF SYSTEMS:  Denies any tobacco use.  He drinks less than a pint  of liquor 2 or 3 times a month is that often.   He does admit to smoking  marijuana on a regular basis.  However, he denies any other illicit drug  use.  He is currently unemployed, uninsured, and obtains his primary  care at [facility].   FAMILY HISTORY:  Noncontributory.   REVIEW OF SYSTEMS:  As per HPI.  All others reviewed negative.   PHYSICAL EXAMINATION:  VITAL SIGNS:  Temperature 98.9; heart rate 130  initially, currently 77; blood pressure 202/130 initially, currently  202/128; pulse initially 113, currently 77; respirations 18; O2  saturation 100% on room air.  GENERAL:  This is an obese African American gentleman lying in bed in no  acute distress.  HEENT:  He is normocephalic, atraumatic.  Pupils are equally round and  reactive to light.  Extraocular movements are intact.  There is scleral  melanocytosis, but no icterus.  Moist mucous membranes.  Oropharynx is  clear.  NECK:  Supple.  No thyromegaly.  No JVD.  No bruits.  HEART:  S1 and S2,  regular rate and rhythm.  There is a grade 1/6  systolic murmur.  No gallops or rubs.  LUNGS:  Clear to auscultation bilaterally.  No crackles, rhonchi, or  wheezes.  ABDOMEN:  Obese, positive bowel sounds, somewhat protuberant but  nondistended and nontender.  EXTREMITIES:  No cyanosis, clubbing, or edema.  MUSCULOSKELETAL:  Bilateral knees without effusion, erythema, calor, or  tenderness to palpation across the joint lines.  However, posterior  right knee is tender to palpation.  There is no palpable abnormality in  the popliteal fossa.  Popliteal pulses palpable at 1+.  SKIN:  Again, no erythema or calor over joints.  Pulses carotid 2+ and  equal as are dorsalis pedis.  NEUROLOGIC:  He is awake, alert, and oriented x3.  Cranial nerves II  through XII are grossly intact.  Strength is 5/5 x4 extremities.  Sensation is grossly intact.   LABORATORY DATA:  On admission, WBC 8.9, hemoglobin 13.7, platelets  172,000.  Sodium 140, potassium 3.4, chloride 105, bicarb 19, glucose  at  191, BUN 11, creatinine 0.5, calcium 9.2, uric acid is 5.7.  Point of  care markers negative x1.  EKG shows normal sinus rhythm and is overall  unremarkable.  X-ray of right knee demonstrates degenerative changes and  possible loose bodies as before, no acute findings.  There are no  change.  There is mention of the possibility of Baker cyst that as seen  before.  Right lower extremity Doppler preliminary report is negative  for DVT, superficial thrombus, or Baker cyst.  CT angio of the chest  negative.  CT angio of abdomen negative except for gallbladder sludge  and tiny gallstones along with diffuse fatty infiltration of the liver  and diverticulosis.  CT angio of the pelvis, no acute findings only  positive for diverticulosis.   ASSESSMENT AND PLAN:  1. Uncontrolled hypertension.  This is chronic and asymptomatic.      Considered discharge with medications, but follow up is not readily      available given the patient is seeing at [facility].  Therefore,      we will admit to observation status and adjust medications while      inpatient.  Unclear of outpatient regimen currently and cannot      confirm since the patient obtains his medication at [facility], which is currently close.  Therefore, given his      hypokalemia, we will start lisinopril 20 mg p.o. daily, which is      also on the $4 list.  For right now, we will avoid      hydrochlorothiazide given the hypokalemia and the possibility of      history of gout.  We will also start Coreg 6.25 mg p.o. b.i.d.,      which is also on the $4 list and has a nonselective beta blockade      along with outflow blockade and helps blood pressure control, more      so than metoprolol.  Both of these can be titrated up quite a bit,      and hydrochlorothiazide can also be considered given $4 at the [facility] list.  If medications are titrated up and there is further      control needed, we can consider clonidine  which again is on the $4      list.  2. Posterior right knee pain.  Currently, there are no signs of gout  on exam as listed above.  Furthermore, the patient reports not ever      having a joint aspiration for diagnosis, however, there is no      indication to do so currently.  Given his location of his pain and      exam, concern for ruptured Baker cyst, however, Dopplers already      done are negative for deep venous thrombosis or Baker cyst.  For      now, will continue empirically with colchicine, allopurinol, and      ibuprofen since the patient already takes this and has had marked      improvement.  3. The patient has a mild anion gap metabolic acidosis with a gap of      16 and a bicarb of 19.  It is unclear as to why he currently has      this.  He does have a small amount of ketones in his urinalysis.      We will check urine ketones, lactic acid, check serum osmolality to      assess for an osmolar gap and if so we will need to check for toxic      alcohol at that point.  However, I assume that given the patient's      sugar of 191 with a gapping ketones, this may be related to      ketosis, likely secondary to alcohol and/or decreased p.o. intake.  4. Proteinuria in urinalysis.  This is likely secondary to a      hypertensive-type nephropathy.  We will obtain a urine      protein/creatinine ratio to assess further.  However, renal      function is currently normal.  Angiotensin-converting enzyme      inhibitor will likely be beneficial in this situation.  Therefore,      we will continue.      Mariea Stable, MD  Electronically Signed      Eduard Clos, MD  Electronically Signed    MA/MEDQ  D:  01/08/2009  T:  01/09/2009  Job:  [id number]

## 2011-01-19 NOTE — Discharge Summary (Signed)
[name], [name]               ACCOUNT NO.:  [id number]   MEDICAL RECORD NO.:  [id number]          PATIENT TYPE:  OBV   LOCATION:  5120                         FACILITY:  [facility]   PHYSICIAN:  Renee Ramus, MD       DATE OF BIRTH:  [birth year]   DATE OF ADMISSION:  01/08/2009  DATE OF DISCHARGE:                               DISCHARGE SUMMARY   PRIMARY DISCHARGE DIAGNOSIS:  Right knee pain, likely gout.   SECONDARY DIAGNOSES:  1. Malignant hypertension.  2. Hyperlipidemia.   HOSPITAL COURSE:  1. Right knee pain.  The patient is a 51 year old male with history of      gout who appeared in the ER with severe right pain.  He said that      this pain was in the posterior aspect of his popliteal fossa,      10/10, consistent with previous pain that has resolved with      colchicine and ibuprofen and has been previously diagnosed with      gout.  The patient does have a history of having problems with      water retention on that knee and has had joint aspirations, but he      is unaware if they have made a clinical diagnosis of gout.  The      patient did receive an ultrasound that was negative for DVT or      evidence of Baker's cyst.  The patient has responded well to his      gout medications.  He will be discharged on his gout medications.      The patient does not have any evidence of alternative diagnosis      including septic joint or acute trauma.  2. Hypertension.  The patient did have malignant hypertension upon      admission with a systolic blood pressure over 200.  This has      resolved and we believe that this is secondary to his pain.  With      adequate treatment of his pain, he will be returned on his pre-      hospital medications and we will not make any adjustments at this      time.  3. Hyperlipidemia.  He will be continued on his Crestor and Lovaza      with instructions to follow up with his primary care physician      regarding medication adjustments.   MEDICATIONS:  Of note, the patient really has no idea what pain  medicines or medicines in general that he takes.  He describes them by  color.  He says he takes them all every day.  I am going to defer that  to his primary care physician as well.   LABORATORY DATA:  1. Mild anemia with a hemoglobin of 12.9, hematocrit of 37.4.  2. Mild decrease in blood glucose at 191 with decreasing to 112 with      treatment of pain.  3. Negative troponin-I.  4. UA tox screen positive for THC and opiates.  5. Alcohol level less than 5.  6. Mildly concentrated urine with ketones and protein, but no evidence      of glucose.  No evidence of infection.   DIAGNOSTICS:  1. CT of the abdomen and pelvis showing no evidence of aortic      dissection or other acute findings.  The patient did have evidence      of gallbladder sludge, but no evidence of acute cholecystitis.  He      does have fatty infiltration of the liver and evidence of      diverticulosis.  2. CT angiogram of the chest showing no acute disease.  3. Plain film of the right knee showing degenerative changes and      possible loose bodies, but no acute findings or interval change      from previous knee film.   DISCHARGE MEDICATIONS:  1. Crestor 10 mg p.o. daily.  2. Lovaza 2 gm p.o. b.i.d.  3. Allopurinol 300 mg p.o. daily.  4. Nexium 40 mg p.o. daily.  5. Prednisone on taper 3 tablets daily x2 days; 2 tablets daily x2      days; 1 tablet daily x2 days.  The patient is instructed to      discontinue this regimen.  6. Lisinopril/hydrochlorothiazide 20/25 one p.o. daily.  7. Atenolol 50 mg p.o. b.i.d.  8. Ibuprofen 300 mg p.o. b.i.d. p.r.n. pain.  9. Indomethacin 50 mg p.o. b.i.d. p.r.n. pain.  The patient was      counseled with respect to taking indomethacin and ibuprofen.  He      was warned against taking them concomitantly and counseled with      regards to possible toxic effects on his liver and GI system.   There are no labs  or studies pending at the time of discharge.   DISPOSITION:  The patient is in stable condition and very anxious for  discharge.   Time spent 35 minutes.      Renee Ramus, MD  Electronically Signed     JF/MEDQ  D:  01/09/2009  T:  01/09/2009  Job:  [id number]   cc:   [facility]

## 2011-10-27 ENCOUNTER — Emergency Department (HOSPITAL_COMMUNITY)
Admission: EM | Admit: 2011-10-27 | Discharge: 2011-10-27 | Disposition: A | Discharge status: Home Health | Payer: Self-pay | Attending: Emergency Medicine | Admitting: Emergency Medicine

## 2011-10-27 ENCOUNTER — Encounter (HOSPITAL_COMMUNITY): Payer: Self-pay

## 2011-10-27 DIAGNOSIS — M109 Gout, unspecified: Secondary | ICD-10-CM | POA: Insufficient documentation

## 2011-10-27 DIAGNOSIS — M25579 Pain in unspecified ankle and joints of unspecified foot: Secondary | ICD-10-CM | POA: Insufficient documentation

## 2011-10-27 DIAGNOSIS — M79609 Pain in unspecified limb: Secondary | ICD-10-CM | POA: Insufficient documentation

## 2011-10-27 DIAGNOSIS — I10 Essential (primary) hypertension: Secondary | ICD-10-CM | POA: Insufficient documentation

## 2011-10-27 DIAGNOSIS — M25569 Pain in unspecified knee: Secondary | ICD-10-CM | POA: Insufficient documentation

## 2011-10-27 DIAGNOSIS — E7800 Pure hypercholesterolemia, unspecified: Secondary | ICD-10-CM | POA: Insufficient documentation

## 2011-10-27 HISTORY — DX: Essential (primary) hypertension: I10

## 2011-10-27 HISTORY — DX: Pure hypercholesterolemia, unspecified: E78.00

## 2011-10-27 HISTORY — DX: Synovial cyst of popliteal space (Baker), unspecified knee: M71.20

## 2011-10-27 MED ORDER — OXYCODONE-ACETAMINOPHEN 5-325 MG PO TABS: 1.0000 | ORAL_TABLET | ORAL | Status: AC | PRN

## 2011-10-27 MED ORDER — OXYCODONE-ACETAMINOPHEN 5-325 MG PO TABS
1.0000 | ORAL_TABLET | Freq: Once | ORAL | Status: AC
  Administered 2011-10-27: 1 via ORAL
  Filled 2011-10-27: qty 1

## 2011-10-27 NOTE — Discharge Instructions (Signed)
CALL YOUR DOCTOR TO SCHEDULE A RECHECK APPOINTMENT. CONTINUE CURRENT MEDICATIONS FOR GOUT AND RETURN HERE AS NEEDED FOR SEVERE PAIN, FEVER OR NEW CONCERN.  Gout Gout is caused by a buildup of uric acid crystals in the joints. The crystals make your joints sore. This is like having sand in your joints. Repeat attacks are common. Gout can be treated. HOME CARE   Do not take aspirin for pain.   Only take medicine as told by your doctor.   You may use cold treatments (ice) on painful joints.   Put ice in a plastic bag.   Place a towel between your skin and the bag.   Leave the ice on for 15 to 20 minutes at a time, 3 to 4 times a day.   Rest in bed as much as possible. When in bed, keep the sheets and blankets off your sore joints.   Keep the sore joints raised (elevated).   Use crutches if your legs or ankles hurt.   Drink enough water and fluids to keep your pee (urine) clear or pale yellow. This helps your body get rid of uric acid. Do not drink alcohol.   Follow diet instructions as told by your doctor.   Keep your body at a healthy weight.  GET HELP RIGHT AWAY IF:   You have a temperature by mouth above 102 F (38.9 C), not controlled by medicine.   You have watery poop (diarrhea).   You are throwing up (vomiting).   You do not feel better in 1 day, or you are getting worse.   Your joint hurts more.   You have the chills.  MAKE SURE YOU:   Understand these instructions.   Will watch your condition.   Will get help right away if you are not doing well or get worse.  Document Released: 06/01/2008 Document Revised: 05/05/2011 Document Reviewed: 12/01/2009 [facility] Patient Information 2012 [place], [place].

## 2011-10-27 NOTE — ED Provider Notes (Addendum)
History     CSN: [id number]  Arrival date & time 10/27/11  1050   First MD Initiated Contact with Patient 10/27/11 1238      Chief Complaint  Patient presents with  . Gout    (Consider location/radiation/quality/duration/timing/severity/associated sxs/prior treatment) Patient is a 51 y.o. male presenting with leg pain. The history is provided by the patient.  Leg Pain  Incident onset: The patient has chronic/recurrent gout in multiple joints. There was no injury mechanism. The pain is present in the right knee and right ankle (Left hand.). The quality of the pain is described as aching and sharp. The pain is moderate. The pain has been constant since onset. Pertinent negatives include no numbness. Treatments tried: He takes Allopurinol and one other medication that is gout specific.    Past Medical History  Diagnosis Date  . Hypertension   . Gout   . Hypercholesteremia   . Baker's cyst of knee     Past Surgical History  Procedure Date  . Finger amputation   . Knee arthroscopy     No family history on file.  History  Substance Use Topics  . Smoking status: Never Smoker   . Smokeless tobacco: Not on file  . Alcohol Use: Yes      Review of Systems  Constitutional: Negative for fever.  Musculoskeletal:       See HPI.  Skin: Negative for rash.  Neurological: Negative for numbness.    Allergies  Hydrocodone-acetaminophen  Home Medications  No current outpatient prescriptions on file.  BP 158/109  Pulse 81  Temp(Src) 98.2 F (36.8 C) (Oral)  Resp 20  Ht 5\' 4"  (1.626 m)  Wt 240 lb (108.863 kg)  BMI 41.20 kg/m2  SpO2 99%  Physical Exam  Constitutional: He is oriented to person, place, and time. He appears well-developed and well-nourished.  Neck: Normal range of motion.  Pulmonary/Chest: Effort normal.  Musculoskeletal: Normal range of motion.       Mild Swelling to left 1st and 2nd MCP joints without redness or warmth. Painful full range of motion.  Right knee moderately swollen with mild redness, no warmth to touch. FROM. Patient is fully weight bearing. Distal pulses 2+.  Neurological: He is alert and oriented to person, place, and time.  Skin: Skin is warm and dry.  Psychiatric: He has a normal mood and affect.    ED Course  Procedures (including critical care time)  Labs Reviewed - No data to display No results found.   No diagnosis found.    MDM          Rodena Medin, PA-C 10/27/11 1331  Rodena Medin, PA-C 01/07/12 2056

## 2011-10-27 NOTE — ED Notes (Signed)
Pt presents with L thumb and R knee pain that pt reports is from his gout.  Pt reports compliance with gout medication.  Pt reports swelling and redness to both joints.

## 2011-10-27 NOTE — ED Provider Notes (Signed)
Medical screening examination/treatment/procedure(s) were performed by non-physician practitioner and as supervising physician I was immediately available for consultation/collaboration.  Mashal Slavick R. Montrez [place], MD 10/27/11 1527 

## 2011-10-27 NOTE — ED Notes (Signed)
C/o left thumb and rt knee pain r/t gout. States medication taking for gout no longer working. States that percocet's needed to control pain. Pt states vicodin causes itching. Pt does present with swollen rt knee that is tender to touch. Pt states hx of baker's cysts. Left thumb is tender as well. Pain increased with mvt but pt is ambulatory. Pedal pulses intact. Pt in no acute distress.

## 2012-01-08 NOTE — ED Provider Notes (Signed)
Medical screening examination/treatment/procedure(s) were performed by non-physician practitioner and as supervising physician I was immediately available for consultation/collaboration.  Juliet Rude. Rubin Payor, MD 01/08/12 [phone]

## 2012-04-05 ENCOUNTER — Emergency Department (HOSPITAL_COMMUNITY)
Admission: EM | Admit: 2012-04-05 | Discharge: 2012-04-06 | Disposition: A | Discharge status: Home / Self Care | Payer: Medicare Other | Attending: Emergency Medicine | Admitting: Emergency Medicine

## 2012-04-05 ENCOUNTER — Encounter (HOSPITAL_COMMUNITY): Payer: Self-pay | Admitting: *Deleted

## 2012-04-05 DIAGNOSIS — R55 Syncope and collapse: Secondary | ICD-10-CM | POA: Insufficient documentation

## 2012-04-05 DIAGNOSIS — I10 Essential (primary) hypertension: Secondary | ICD-10-CM | POA: Insufficient documentation

## 2012-04-05 DIAGNOSIS — E7800 Pure hypercholesterolemia, unspecified: Secondary | ICD-10-CM | POA: Insufficient documentation

## 2012-04-05 DIAGNOSIS — M109 Gout, unspecified: Secondary | ICD-10-CM | POA: Insufficient documentation

## 2012-04-05 NOTE — ED Notes (Signed)
Pt from home: pt reports "blacking out" at around 18:00 or 19:00 this evening.  Pt's friends reports that they saw his eyes rolled back and think it may have been a seizure.  Pt was seen ambulating into the room.  Currently denies HA, dizziness, SOB.  Pt does not remember the events.  Pt's family also does not think the pt hit his head.  No oral injuries noted on pt.

## 2012-04-06 LAB — POCT I-STAT, CHEM 8
Glucose, Bld: 116 mg/dL — ABNORMAL HIGH (ref 70–99)
HCT: 39 % (ref 39.0–52.0)
Hemoglobin: 13.3 g/dL (ref 13.0–17.0)
Potassium: 3.8 mEq/L (ref 3.5–5.1)

## 2012-04-06 NOTE — ED Provider Notes (Addendum)
History     CSN: [id number]  Arrival date & time 04/05/12  2022   First MD Initiated Contact with Patient 04/05/12 2259      No chief complaint on file.   The history is provided by the patient.   the patient reports he was having a normal day today when he went over to his friends house.  He was sitting on the porch and became warm and the next thing he knew he was lying on the porch.  His friends that he passed out.  The friend is not available for me to discuss with but it does not sound like he was told of any seizure activity.  The patient did urinate on himself.  He did not bite his tongue.  He denies headache at this time.  He has no weakness of his upper lower extremities.  Denies chest pain or shortness of breath.  He has no abdominal pain.  Denies nausea vomiting diarrhea.  He's had no recent fevers or chills or illness.  He is eating and drinking normally today.  He did not exert himself.  He has no prior history of seizures.  He has a history of hypertension gout and hypercholesterolemia.  He has no known coronary disease.  No preceding or post chest pain or palpitations or shortness of breath or other anginal symptoms. Past Medical History  Diagnosis Date  . Hypertension   . Gout   . Hypercholesteremia   . Baker's cyst of knee     Past Surgical History  Procedure Date  . Finger amputation   . Knee arthroscopy     History reviewed. No pertinent family history.  History  Substance Use Topics  . Smoking status: Never Smoker   . Smokeless tobacco: Not on file  . Alcohol Use: Yes     occasional      Review of Systems  All other systems reviewed and are negative.    Allergies  Hydrocodone-acetaminophen  Home Medications   Current Outpatient Rx  Name Route Sig Dispense Refill  . ALLOPURINOL 100 MG PO TABS Oral Take 100 mg by mouth 2 (two) times daily.    Marland Kitchen ESOMEPRAZOLE MAGNESIUM 40 MG PO CPDR Oral Take 40 mg by mouth daily before breakfast.      BP 112/66   Pulse 80  Temp 98.3 F (36.8 C) (Oral)  Resp 22  SpO2 98%  Physical Exam  Nursing note and vitals reviewed. Constitutional: He is oriented to person, place, and time. He appears well-developed and well-nourished.  HENT:  Head: Normocephalic and atraumatic.  Eyes: EOM are normal. Pupils are equal, round, and reactive to light.  Neck: Normal range of motion.  Cardiovascular: Normal rate, regular rhythm, normal heart sounds and intact distal pulses.   Pulmonary/Chest: Effort normal and breath sounds normal. No respiratory distress.  Abdominal: Soft. He exhibits no distension. There is no tenderness.  Musculoskeletal: Normal range of motion.  Neurological: He is alert and oriented to person, place, and time.       5/5 strength in major muscle groups of  bilateral upper and lower extremities. Speech normal. No facial asymetry.   Skin: Skin is warm and dry.  Psychiatric: He has a normal mood and affect. Judgment normal.    ED Course  Procedures (including critical care time)   Date: 04/06/2012  Rate: 77  Rhythm: normal sinus rhythm  QRS Axis: normal  Intervals: normal  ST/T Wave abnormalities: normal  Conduction Disutrbances: none  Narrative Interpretation:  Old EKG Reviewed: No significant changes noted     Labs Reviewed  POCT I-STAT, CHEM 8 - Abnormal; Notable for the following:    Glucose, Bld 116 (*)     Calcium, Ion 1.28 (*)     All other components within normal limits   No results found.   1. Syncope       MDM  Unclear etiology of syncope.  Doubt cardiac syncope.  The patient had no preceding chest pain or palpitations and has none at this time.  His EKG is normal sinus rhythm.  Patient has a normal neurologic exam at this time.  He did not bite his tongue but he did urinate on himself.  Unclear for her seizure activity as I was unable to talk with any of the witnesses.  The patient will be referred to neurology and his PCP for followup.        Lyanne Co, MD 04/06/12 1610  Lyanne Co, MD 04/06/12 [phone]

## 2012-04-21 ENCOUNTER — Other Ambulatory Visit: Payer: Self-pay | Admitting: Neurology

## 2012-04-21 DIAGNOSIS — R51 Headache: Secondary | ICD-10-CM

## 2012-04-21 DIAGNOSIS — R404 Transient alteration of awareness: Secondary | ICD-10-CM

## 2012-04-21 DIAGNOSIS — M109 Gout, unspecified: Secondary | ICD-10-CM

## 2012-05-04 ENCOUNTER — Inpatient Hospital Stay: Admission: RE | Admit: 2012-05-04 | Payer: Medicare Other | Source: Ambulatory Visit

## 2012-11-15 ENCOUNTER — Emergency Department (INDEPENDENT_AMBULATORY_CARE_PROVIDER_SITE_OTHER)
Admission: EM | Admit: 2012-11-15 | Discharge: 2012-11-15 | Disposition: A | Discharge status: Home / Self Care | Payer: Medicare Other | Source: Home / Self Care

## 2012-11-15 ENCOUNTER — Encounter (HOSPITAL_COMMUNITY): Payer: Self-pay

## 2012-11-15 DIAGNOSIS — M109 Gout, unspecified: Secondary | ICD-10-CM

## 2012-11-15 MED ORDER — ATENOLOL 100 MG PO TABS: 100.0000 mg | ORAL_TABLET | Freq: Two times a day (BID) | ORAL | Status: DC

## 2012-11-15 MED ORDER — ESOMEPRAZOLE MAGNESIUM 40 MG PO CPDR: 40.0000 mg | DELAYED_RELEASE_CAPSULE | Freq: Every day | ORAL | Status: DC

## 2012-11-15 MED ORDER — COLCHICINE 0.6 MG PO TABS: 0.6000 mg | ORAL_TABLET | Freq: Two times a day (BID) | ORAL | Status: DC | PRN

## 2012-11-15 MED ORDER — ALLOPURINOL 300 MG PO TABS: 300.0000 mg | ORAL_TABLET | Freq: Every day | ORAL | Status: DC

## 2012-11-15 MED ORDER — HYDROCHLOROTHIAZIDE 25 MG PO TABS: 25.0000 mg | ORAL_TABLET | Freq: Every day | ORAL | Status: DC

## 2012-11-15 MED ORDER — TRAMADOL HCL 50 MG PO TABS: 50.0000 mg | ORAL_TABLET | Freq: Four times a day (QID) | ORAL | Status: DC | PRN

## 2012-11-15 MED ORDER — METFORMIN HCL 500 MG PO TABS: 500.0000 mg | ORAL_TABLET | Freq: Two times a day (BID) | ORAL | Status: DC

## 2012-11-15 MED ORDER — LORATADINE 10 MG PO TABS: 10.0000 mg | ORAL_TABLET | Freq: Every day | ORAL | Status: DC

## 2012-11-15 MED ORDER — LISINOPRIL 40 MG PO TABS: 40.0000 mg | ORAL_TABLET | Freq: Every day | ORAL | Status: DC

## 2012-11-15 NOTE — ED Notes (Signed)
Patient has history of HTN, DM and gout Needs medication refills

## 2012-11-15 NOTE — ED Provider Notes (Signed)
History     CSN: [id number]  Arrival date & time 11/15/12  1000  CC: needs refill on medications, right knee pain and swelling    (Consider location/radiation/quality/duration/timing/severity/associated sxs/prior Treatment)  HPI  Patient is 52 year old male with medical conditions listed below, presents to clinic for further evaluation of right knee pain and swelling, associated with difficulty with ambulation. Patient reports this started 2-3 days prior to visit and explains that his dose of allopurinol has been decreased from his usual dose of 300 mg. He describes pain as constant, radiating to right foot, 7/10 in severity, sharp and occasionally dull, with no specific alleviating factors, aggravated with ambulation or touching. Patient explains that he has flares of gout approximately every 7 months and this appears to be similar to his typical gout flare. Patient denies fevers or chills, no redness in the area, no recent traumas to specific area. Patient denies fevers and chills, no other systemic symptoms.  Past Medical History  Diagnosis Date  . Hypertension   . Gout   . Hypercholesteremia   . Baker's cyst of knee     Past Surgical History  Procedure Laterality Date  . Finger amputation    . Knee arthroscopy      No family history on file.  History  Substance Use Topics  . Smoking status: Never Smoker   . Smokeless tobacco: Not on file  . Alcohol Use: Yes     Comment: occasional      Review of Systems  Constitutional: Negative for fever, chills, diaphoresis, appetite change and fatigue.  HENT: Negative for ear pain, nosebleeds, congestion, facial swelling, rhinorrhea, neck pain, neck stiffness and ear discharge.   Eyes: Negative for pain, discharge, redness, itching and visual disturbance.  Respiratory: Negative for cough, choking, chest tightness, shortness of breath, wheezing and stridor.   Cardiovascular: Negative for chest pain, palpitations and leg swelling.   Gastrointestinal: Negative for abdominal distention.  Genitourinary: Negative for dysuria, urgency, frequency, hematuria, flank pain, decreased urine volume, difficulty urinating and dyspareunia.  Musculoskeletal: Negative for back pain. Neurological: Negative for dizziness, tremors, seizures, syncope, facial asymmetry, speech difficulty, weakness, light-headedness, numbness and headaches.  Hematological: Negative for adenopathy. Does not bruise/bleed easily.  Psychiatric/Behavioral: Negative for hallucinations, behavioral problems, confusion, dysphoric mood, decreased concentration and agitation.    Allergies  Hydrocodone-acetaminophen  Home Medications   Current Outpatient Rx  Name  Route  Sig  Dispense  Refill  . allopurinol (ZYLOPRIM) 100 MG tablet   Oral   Take 100 mg by mouth 2 (two) times daily.         Marland Kitchen esomeprazole (NEXIUM) 40 MG capsule   Oral   Take 40 mg by mouth daily before breakfast.           There were no vitals taken for this visit.  Physical Exam   Constitutional: Appears well-developed and well-nourished. No distress.  HENT: Normocephalic. External right and left ear normal. Oropharynx is clear and moist.  Eyes: Conjunctivae and EOM are normal. PERRLA, no scleral icterus.  Neck: Normal ROM. Neck supple. No JVD. No tracheal deviation. No thyromegaly.  CVS: RRR, S1/S2 +, no murmurs, no gallops, no carotid bruit.  Pulmonary: Effort and breath sounds normal, no stridor, rhonchi, wheezes, rales.  Abdominal: Soft. BS +,  no distension, tenderness, rebound or guarding.  Musculoskeletal: Right knee swelling, tenderness to palpation, no erythema, no warmth to touch, limited range of motion due to pain, no problems with left knee  Lymphadenopathy: No lymphadenopathy noted, cervical,  inguinal. Neuro: Alert. Normal reflexes, muscle tone coordination. No cranial nerve deficit. Skin: Skin is warm and dry. No rash noted. Not diaphoretic. No erythema. No pallor.   Psychiatric: Normal mood and affect. Behavior, judgment, thought content normal.   ED Course  Procedures (including critical care time)  Labs Reviewed - No data to display No results found.   Gout flare  - Symptoms and physical exam findings consistent with gout flare - Will increase the dose of allopurinol to 300 mg patient was taking before - Will also add which he seemed to the regimen - Will add tramadol for pain, advised to followup in 2 weeks to evaluate improvement - Patient advised to calm back and see [place] sooner if his symptoms do not resolve or get worse   MDM  Gout flare         Alison Murray, MD 11/15/12 1047

## 2013-01-13 ENCOUNTER — Emergency Department (HOSPITAL_COMMUNITY)
Admission: EM | Admit: 2013-01-13 | Discharge: 2013-01-13 | Disposition: A | Discharge status: Home / Self Care | Payer: Medicare Other | Attending: Emergency Medicine | Admitting: Emergency Medicine

## 2013-01-13 ENCOUNTER — Encounter (HOSPITAL_COMMUNITY): Payer: Self-pay | Admitting: Neurology

## 2013-01-13 ENCOUNTER — Emergency Department (HOSPITAL_COMMUNITY)
Admission: EM | Admit: 2013-01-13 | Discharge: 2013-01-13 | Disposition: A | Discharge status: Nursing Home (Includes ICF) | Payer: Medicare Other | Source: Home / Self Care | Attending: Emergency Medicine | Admitting: Emergency Medicine

## 2013-01-13 ENCOUNTER — Emergency Department (HOSPITAL_COMMUNITY): Payer: Medicare Other

## 2013-01-13 ENCOUNTER — Encounter (HOSPITAL_COMMUNITY): Payer: Self-pay | Admitting: Emergency Medicine

## 2013-01-13 DIAGNOSIS — Z8639 Personal history of other endocrine, nutritional and metabolic disease: Secondary | ICD-10-CM | POA: Insufficient documentation

## 2013-01-13 DIAGNOSIS — R109 Unspecified abdominal pain: Secondary | ICD-10-CM

## 2013-01-13 DIAGNOSIS — R1013 Epigastric pain: Secondary | ICD-10-CM | POA: Insufficient documentation

## 2013-01-13 DIAGNOSIS — Z79899 Other long term (current) drug therapy: Secondary | ICD-10-CM | POA: Insufficient documentation

## 2013-01-13 DIAGNOSIS — E7800 Pure hypercholesterolemia, unspecified: Secondary | ICD-10-CM | POA: Insufficient documentation

## 2013-01-13 DIAGNOSIS — I10 Essential (primary) hypertension: Secondary | ICD-10-CM | POA: Insufficient documentation

## 2013-01-13 DIAGNOSIS — Z862 Personal history of diseases of the blood and blood-forming organs and certain disorders involving the immune mechanism: Secondary | ICD-10-CM | POA: Insufficient documentation

## 2013-01-13 DIAGNOSIS — Z8739 Personal history of other diseases of the musculoskeletal system and connective tissue: Secondary | ICD-10-CM | POA: Insufficient documentation

## 2013-01-13 DIAGNOSIS — E119 Type 2 diabetes mellitus without complications: Secondary | ICD-10-CM | POA: Insufficient documentation

## 2013-01-13 LAB — COMPREHENSIVE METABOLIC PANEL
AST: 24 U/L (ref 0–37)
CO2: 24 mEq/L (ref 19–32)
Calcium: 10.1 mg/dL (ref 8.4–10.5)
Creatinine, Ser: 1.05 mg/dL (ref 0.50–1.35)
GFR calc Af Amer: >90 mL/min (ref >90)
GFR calc non Af Amer: 79 mL/min — ABNORMAL LOW (ref >90)
Glucose, Bld: 124 mg/dL — ABNORMAL HIGH (ref 70–99)

## 2013-01-13 LAB — CBC WITH DIFFERENTIAL/PLATELET
Basophils Absolute: 0 10*3/uL (ref 0.0–0.1)
Basophils Relative: 0 % (ref 0–1)
Eosinophils Absolute: 3.5 10*3/uL — ABNORMAL HIGH (ref 0.0–0.7)
Hemoglobin: 16.5 g/dL (ref 13.0–17.0)
MCH: 31.7 pg (ref 26.0–34.0)
MCHC: 35.9 g/dL (ref 30.0–36.0)
Monocytes Absolute: 0.7 10*3/uL (ref 0.1–1.0)
Neutrophils Relative %: 47 % (ref 43–77)
Platelets: 326 10*3/uL (ref 150–400)
RDW: 14.7 % (ref 11.5–15.5)

## 2013-01-13 LAB — URINALYSIS, ROUTINE W REFLEX MICROSCOPIC
Leukocytes, UA: NEGATIVE
Protein, ur: 30 mg/dL — AB
Urobilinogen, UA: 0.2 mg/dL (ref 0.0–1.0)

## 2013-01-13 LAB — URINE MICROSCOPIC-ADD ON

## 2013-01-13 MED ORDER — HYDROMORPHONE HCL PF 1 MG/ML IJ SOLN: 2.0000 mg | Freq: Once | INTRAMUSCULAR | Status: DC

## 2013-01-13 MED ORDER — SODIUM CHLORIDE 0.9 % IV SOLN
1000.0000 mL | Freq: Once | INTRAVENOUS | Status: AC
  Administered 2013-01-13: 1000 mL via INTRAVENOUS

## 2013-01-13 MED ORDER — ONDANSETRON HCL 4 MG/2ML IJ SOLN
4.0000 mg | Freq: Once | INTRAMUSCULAR | Status: AC
  Administered 2013-01-13: 4 mg via INTRAVENOUS

## 2013-01-13 MED ORDER — ONDANSETRON HCL 4 MG/2ML IJ SOLN: 4.0000 mg | Freq: Once | INTRAMUSCULAR | Status: DC

## 2013-01-13 MED ORDER — SODIUM CHLORIDE 0.9 % IV SOLN
INTRAVENOUS | Status: DC
  Administered 2013-01-13 (×2): via INTRAVENOUS

## 2013-01-13 MED ORDER — ONDANSETRON HCL 4 MG/2ML IJ SOLN
INTRAMUSCULAR | Status: AC
  Filled 2013-01-13: qty 2

## 2013-01-13 MED ORDER — SODIUM CHLORIDE 0.9 % IV SOLN: 1000.0000 mL | Freq: Once | INTRAVENOUS | Status: DC

## 2013-01-13 MED ORDER — HYDROMORPHONE HCL PF 1 MG/ML IJ SOLN
2.0000 mg | Freq: Once | INTRAMUSCULAR | Status: AC
Start: 2013-01-13 — End: 2013-01-13
  Administered 2013-01-13: 2 mg via INTRAVENOUS

## 2013-01-13 MED ORDER — METOCLOPRAMIDE HCL 10 MG PO TABS: 10.0000 mg | ORAL_TABLET | Freq: Four times a day (QID) | ORAL | Status: DC | PRN

## 2013-01-13 MED ORDER — DIPHENHYDRAMINE HCL 50 MG/ML IJ SOLN
INTRAMUSCULAR | Status: AC
  Filled 2013-01-13: qty 1

## 2013-01-13 MED ORDER — IOHEXOL 300 MG/ML  SOLN
100.0000 mL | Freq: Once | INTRAMUSCULAR | Status: AC | PRN
  Administered 2013-01-13: 100 mL via INTRAVENOUS

## 2013-01-13 MED ORDER — HYDROMORPHONE HCL PF 1 MG/ML IJ SOLN
0.5000 mg | Freq: Once | INTRAMUSCULAR | Status: AC
  Administered 2013-01-13: 0.5 mg via INTRAVENOUS
  Filled 2013-01-13: qty 1

## 2013-01-13 MED ORDER — DIPHENHYDRAMINE HCL 50 MG/ML IJ SOLN
25.0000 mg | Freq: Once | INTRAMUSCULAR | Status: AC
  Administered 2013-01-13: 25 mg via INTRAVENOUS

## 2013-01-13 MED ORDER — HYDROMORPHONE HCL PF 1 MG/ML IJ SOLN
INTRAMUSCULAR | Status: AC
  Filled 2013-01-13: qty 2

## 2013-01-13 MED ORDER — IOHEXOL 300 MG/ML  SOLN
50.0000 mL | Freq: Once | INTRAMUSCULAR | Status: AC | PRN
  Administered 2013-01-13: 50 mL via ORAL

## 2013-01-13 MED ORDER — ONDANSETRON HCL 4 MG/2ML IJ SOLN
4.0000 mg | Freq: Once | INTRAMUSCULAR | Status: AC
  Administered 2013-01-13: 4 mg via INTRAVENOUS
  Filled 2013-01-13: qty 2

## 2013-01-13 NOTE — ED Provider Notes (Signed)
History     CSN: [id number]  Arrival date & time 01/13/13  1554   First MD Initiated Contact with Patient 01/13/13 1558      Chief Complaint  Patient presents with  . Abdominal Pain    (Consider location/radiation/quality/duration/timing/severity/associated sxs/prior treatment) HPI Patient presents after transfer from urgent care with concerns of abdominal pain. The patient states that this episode began approximately 5 days ago, subtly.  Since onset the pain has been focally about the epigastrium, though with diffuse mild radiation. The pain is sore, worse following by mouth intake. There is associated postprandial nausea with vomiting. No diarrhea. Patient complains of subjective chills and fever. No relief with anything. He denies chest pain, dyspnea. He states he is compliant with all medication.  Past Medical History  Diagnosis Date  . Hypertension   . Gout   . Hypercholesteremia   . Baker's cyst of knee   . Diabetes mellitus without complication     Past Surgical History  Procedure Laterality Date  . Finger amputation    . Knee arthroscopy      No family history on file.  History  Substance Use Topics  . Smoking status: Never Smoker   . Smokeless tobacco: Not on file  . Alcohol Use: Yes     Comment: occasional      Review of Systems  Constitutional:       Per HPI, otherwise negative  HENT:       Per HPI, otherwise negative  Respiratory:       Per HPI, otherwise negative  Cardiovascular:       Per HPI, otherwise negative  Gastrointestinal: Negative for vomiting.  Endocrine:       Negative aside from HPI  Genitourinary:       Neg aside from HPI   Musculoskeletal:       Per HPI, otherwise negative  Skin: Negative.   Neurological: Negative for syncope.    Allergies  Hydrocodone-acetaminophen  Home Medications   Current Outpatient Rx  Name  Route  Sig  Dispense  Refill  . allopurinol (ZYLOPRIM) 300 MG tablet   Oral   Take 1 tablet  (300 mg total) by mouth daily.   30 tablet   1   . atenolol (TENORMIN) 100 MG tablet   Oral   Take 1 tablet (100 mg total) by mouth 2 (two) times daily. Take two tablets twice daily   60 tablet   3   . colchicine 0.6 MG tablet   Oral   Take 0.6 mg by mouth 2 (two) times daily as needed. For gout         . esomeprazole (NEXIUM) 40 MG capsule   Oral   Take 1 capsule (40 mg total) by mouth daily before breakfast.   30 capsule   3   . hydrochlorothiazide (HYDRODIURIL) 25 MG tablet   Oral   Take 1 tablet (25 mg total) by mouth daily.   30 tablet   3   . lisinopril (PRINIVIL,ZESTRIL) 40 MG tablet   Oral   Take 1 tablet (40 mg total) by mouth daily.   30 tablet   3   . loratadine (CLARITIN) 10 MG tablet   Oral   Take 1 tablet (10 mg total) by mouth daily.   30 tablet   3   . metFORMIN (GLUCOPHAGE) 500 MG tablet   Oral   Take 1 tablet (500 mg total) by mouth 2 (two) times daily with a meal.  60 tablet   3   . traMADol (ULTRAM) 50 MG tablet   Oral   Take 1 tablet (50 mg total) by mouth every 6 (six) hours as needed for pain.   30 tablet   1     BP 166/105  Pulse 88  Temp(Src) 98.6 F (37 C) (Oral)  Resp 14  SpO2 95%  Physical Exam  Nursing note and vitals reviewed. Constitutional: He is oriented to person, place, and time. He appears well-developed. No distress.  HENT:  Head: Normocephalic and atraumatic.  Eyes: Conjunctivae and EOM are normal.  Cardiovascular: Normal rate and regular rhythm.   Pulmonary/Chest: Effort normal. No stridor. No respiratory distress.  Abdominal: Soft. He exhibits no distension. There is no hepatosplenomegaly. There is tenderness in the epigastric area. There is guarding. There is no rigidity, no rebound, no CVA tenderness, no tenderness at McBurney's point and negative Murphy's sign.  Non-peritoneal abd, w no pain on percussion, and soft w deep palpation when distracted.  Musculoskeletal: He exhibits no edema.  Neurological:  He is alert and oriented to person, place, and time.  Skin: Skin is warm and dry.  Psychiatric: He has a normal mood and affect.    ED Course  Procedures (including critical care time)  Labs Reviewed  CBC WITH DIFFERENTIAL  COMPREHENSIVE METABOLIC PANEL  LIPASE, BLOOD  URINALYSIS, ROUTINE W REFLEX MICROSCOPIC   No results found.   No diagnosis found.  Update: Patient appears calm.  Labs notable for mildly elevated lipase, no e/o sig hepato-biliary dysfunction. Ct w/o acute appy or acute chole, but w gallstones.   MDM  Patient presents with days of abdominal pain, nausea, postprandial vomiting. On exam he is in no distress, with a tender abdomen is non-peritoneal.  The patient is afebrile, and his evaluation is most notable for demonstration of a mildly elevated lipase. Following multiple boluses of IV fluids, analgesics, antiemetics, the patient was substantially improved. The patient was discharged in stable condition with primary care and surgery followup for his gallstones, elevated lipase.        Gerhard Munch, MD 01/13/13 Windell Moment

## 2013-01-13 NOTE — ED Notes (Signed)
1st attempt for IV stick failed.

## 2013-01-13 NOTE — ED Notes (Signed)
Pt c/o abd pain onset Monday Sx include: f/v/n/d Taking pepto bismol and alka seltzer for discomfort w/no relief  He is alert w/moderate pain and hyperventilating Dr. Lorenz Coaster has been notified of pt's sx

## 2013-01-13 NOTE — ED Notes (Signed)
Report given to [facility]  

## 2013-01-13 NOTE — ED Provider Notes (Signed)
Chief Complaint:   Chief Complaint  Patient presents with  . Abdominal Pain    History of Present Illness:    [name] is a 52 year old male with hypertension diabetes who has had a five-day history of mid abdominal pain without radiation the pain is worse if it's and better with Pepto-Bismol. He's had nausea and vomiting of all by mouth intake, sweats, has felt feverish, and had loose stools after eating and there is no blood in the vomitus or the stool and the pain is getting worse. At this point is severe and rated 10 over 10 in intensity. He denies any urinary symptoms. No history of gallbladder disease, ulcer disease, diverticulitis or pancreatitis.  Review of Systems:  Other than noted above, the patient denies any of the following symptoms: Constitutional:  No fever, chills, fatigue, weight loss or anorexia. Lungs:  No cough or shortness of breath. Heart:  No chest pain, palpitations, syncope or edema.  No cardiac history. Abdomen:  No nausea, vomiting, hematememesis, melena, diarrhea, or hematochezia. GU:  No dysuria, frequency, urgency, or hematuria.  No testicular pain or swelling.  PMFSH:  Past medical history, family history, social history, meds, and allergies were reviewed along with nurse's notes.  No prior abdominal surgeries or history of GI problems.  No use of NSAIDs or aspirin.  No excessive  alcohol intake. He has diabetes and hypertension. He's on allopurinol, Tenormin, Nexium, hydrochlorothiazide, lisinopril, ranitidine, and metformin. He's allergic to hydrocodone, but can take other opioids.  Physical Exam:   Vital signs:  BP 168/116  Pulse 83  Temp(Src) 97.8 F (36.6 C)  Resp 16  SpO2 100% Gen:  Alert, oriented, in severe distress, moaning and crying in pain. Lungs:  Breath sounds clear and equal bilaterally.  No wheezes, rales or rhonchi. Heart:  Regular rhythm.  No gallops or murmers.   Abdomen:  Abdomen is slightly distended, tense, and boardlike.  Exquisitely tender to the slightest touch over the entire abdomen. Bowel sounds are not heard. Skin:  Clear, warm and dry.  No rash.  Labs:   Results for orders placed during the hospital encounter of 04/05/12  POCT I-STAT, CHEM 8      Result Value Range   Sodium 140  135 - 145 mEq/L   Potassium 3.8  3.5 - 5.1 mEq/L   Chloride 104  96 - 112 mEq/L   BUN 22  6 - 23 mg/dL   Creatinine, Ser 1.61  0.50 - 1.35 mg/dL   Glucose, Bld 096 (*) 70 - 99 mg/dL   Calcium, Ion 0.45 (*) 1.12 - 1.23 mmol/L   TCO2 23  0 - 100 mmol/L   Hemoglobin 13.3  13.0 - 17.0 g/dL   HCT 40.9  81.1 - 91.4 %    Course in Urgent Care Center:   Started on IV normal saline, given Dilaudid 2 mg IV, and Zofran 4 mg IV. [facility] was called for transport to the emergency department.  Assessment:  The encounter diagnosis was Abdominal pain.  He appears to have an acute abdomen, differential diagnosis includes pancreatitis, cholecystitis, perforated ulcer, diverticulitis, or appendicitis.  Plan:   1.  The following meds were prescribed:   New Prescriptions   No medications on file   2.  The patient was transported to the emergency department via [facility].  Medical Decision Making:  52 year old male has a 6 day history of gradually increasing mid abdominal pain, nausea, vomiting, diarrhea, and fever.  At this point his pain  is severe and he is crying and groaning in pain.  His abdomen is board like, rigid, and exquisitely tender to palpation, suggesting an acute abdomen.  We are starting an IV of NS and will give Dilaudid 2 mg IV and Zofran 4 mg IV, then transfer via [facility].     Reuben Likes, MD 01/13/13 [phone]

## 2013-01-13 NOTE — ED Notes (Signed)
CT notified patient finished with contrast 

## 2013-01-13 NOTE — ED Notes (Signed)
Patient transported to CT 

## 2013-01-13 NOTE — ED Notes (Signed)
NAD noted at time of d/c home 

## 2013-01-13 NOTE — ED Notes (Signed)
IV no longer patent. MD notified that pt request not to be stuck again.

## 2013-01-13 NOTE — ED Notes (Signed)
Per [facility] pt comes from [facility] c/o epigastric pain initially 10/10. This has been occurring for 5 days with n/v. Pt given dilaudid, reportedly had rx, given benadryl. C/o 9/10 pain. Pt non-complaint with DM or HTN meds.

## 2013-01-16 ENCOUNTER — Encounter (INDEPENDENT_AMBULATORY_CARE_PROVIDER_SITE_OTHER): Payer: Self-pay | Admitting: Surgery

## 2013-01-16 ENCOUNTER — Ambulatory Visit (INDEPENDENT_AMBULATORY_CARE_PROVIDER_SITE_OTHER): Payer: MEDICARE | Admitting: Surgery

## 2013-01-16 VITALS — BP 114/84 | HR 80 | Temp 98.4°F | Resp 16 | Ht 67.0 in | Wt 204.4 lb

## 2013-01-16 DIAGNOSIS — R197 Diarrhea, unspecified: Secondary | ICD-10-CM | POA: Insufficient documentation

## 2013-01-16 DIAGNOSIS — K8020 Calculus of gallbladder without cholecystitis without obstruction: Secondary | ICD-10-CM | POA: Insufficient documentation

## 2013-01-16 LAB — CBC
HCT: 48.6 % (ref 39.0–52.0)
MCHC: 35.8 g/dL (ref 30.0–36.0)
MCV: 87.7 fL (ref 78.0–100.0)
RDW: 15.6 % — ABNORMAL HIGH (ref 11.5–15.5)

## 2013-01-16 LAB — COMPREHENSIVE METABOLIC PANEL
AST: 22 U/L (ref 0–37)
Alkaline Phosphatase: 110 U/L (ref 39–117)
BUN: 30 mg/dL — ABNORMAL HIGH (ref 6–23)
Calcium: 10.4 mg/dL (ref 8.4–10.5)
Creat: 2.01 mg/dL — ABNORMAL HIGH (ref 0.50–1.35)

## 2013-01-16 MED ORDER — METRONIDAZOLE 500 MG PO TABS: 500.0000 mg | ORAL_TABLET | Freq: Three times a day (TID) | ORAL | Status: DC

## 2013-01-16 NOTE — Patient Instructions (Addendum)
We will obtain some laboratory studies today, we'll start you on some antibiotics, get an ultrasound of your gallbladder tomorrow.

## 2013-01-16 NOTE — Progress Notes (Signed)
NAMEBRAYTON Jones DOB: 09/08/1960 MRN: [id number]                                                                                      DATE: 01/16/2013  PCP: Standley Dakins, MD Referring Provider: Cleora Fleet, MD  IMPRESSION:  1. Diarrhea, with intermittent postprandial nausea 2. Gallbladder sludge with recent slight elevation in lipase  PLAN:   I'm concerned he may have an infectious diarrhea. I don't think he has acute cholecystitis. We are going to obtain CBC, CMET, lipase, and stool culture. I am going to empirically start him on some Flagyl 500 3 times a day and see if he improves. Will also order a gallbladder ultrasound to be done tomorrow.                 CC:  Chief Complaint  Patient presents with  . Cholelithiasis    HPI:  [name] is a 52 y.o.  male who presents for evaluation of gallstones. He was in the ED a few days ago with abdominal pain. A CT scan showed some sludge in the gallbladder but was without evidence of acute abdominal process and no evidence of acute cholecystitis. Notable lab studies included a slightly elevated white count of 13,000, and a slightly elevated lipase. He appeared to be without acute abdomen by physical examination and was sent here today for followup. He notes that since his emergency department continued to have some mild nausea but is able to keep food down. However he is having diarrhea up to 8-10 times a day and sometimes greenish fluid coming out rather than stool. No blood.also notes his pain is now more left lower quadrant and is not having any right upper quadrant pain at all. He has not measured fever at home. He is not having urinary symptoms.  PMH:  has a past medical history of Hypertension; Gout; Hypercholesteremia; Baker's cyst of knee; and Diabetes mellitus without complication.  PSH:   has past surgical history that includes Finger amputation and Knee arthroscopy.  ALLERGIES:   Allergies  Allergen Reactions  .  Hydrocodone-Acetaminophen Itching    MEDICATIONS: Current outpatient prescriptions:allopurinol (ZYLOPRIM) 300 MG tablet, Take 1 tablet (300 mg total) by mouth daily., Disp: 30 tablet, Rfl: 1;  atenolol (TENORMIN) 100 MG tablet, Take 1 tablet (100 mg total) by mouth 2 (two) times daily. Take two tablets twice daily, Disp: 60 tablet, Rfl: 3;  colchicine 0.6 MG tablet, Take 0.6 mg by mouth 2 (two) times daily as needed. For gout, Disp: , Rfl:  esomeprazole (NEXIUM) 40 MG capsule, Take 1 capsule (40 mg total) by mouth daily before breakfast., Disp: 30 capsule, Rfl: 3;  hydrochlorothiazide (HYDRODIURIL) 25 MG tablet, Take 1 tablet (25 mg total) by mouth daily., Disp: 30 tablet, Rfl: 3;  lisinopril (PRINIVIL,ZESTRIL) 40 MG tablet, Take 1 tablet (40 mg total) by mouth daily., Disp: 30 tablet, Rfl: 3 loratadine (CLARITIN) 10 MG tablet, Take 1 tablet (10 mg total) by mouth daily., Disp: 30 tablet, Rfl: 3;  metFORMIN (GLUCOPHAGE) 500 MG tablet, Take 1 tablet (500 mg total) by mouth 2 (two) times daily with a meal., Disp: 60 tablet,  Rfl: 3;  metoCLOPramide (REGLAN) 10 MG tablet, Take 1 tablet (10 mg total) by mouth every 6 (six) hours as needed., Disp: 15 tablet, Rfl: 0 traMADol (ULTRAM) 50 MG tablet, Take 1 tablet (50 mg total) by mouth every 6 (six) hours as needed for pain., Disp: 30 tablet, Rfl: 1  ROS: He has filled out our 12 point review of systems and it is negative . EXAM:   VITAL SIGNS: BP 114/84  Pulse 80  Temp(Src) 98.4 F (36.9 C)  Resp 16  Ht 5\' 7"  (1.702 m)  Wt 204 lb 6.4 oz (92.715 kg)  BMI 32.01 kg/m2  GENERAL:  The patient is alert, oriented, and generally healthy-appearing, NAD. Mood and affect are normal.  HEENT:  The head is normocephalic, the eyes nonicteric, the pupils were round regular and equal. EOMs are normal. Pharynx normal. Dentition good.  NECK:  The neck is supple and there are no masses or thyromegaly.  LUNGS:  Normal respirations and clear to  auscultation.  HEART:  Regular rhythm, with no murmurs rubs or gallops. Pulses are intact carotid dorsalis pedis and posterior tibial. No significant varicosities are noted.  ABDOMEN:  His abdomen is not distended, is fairly soft, some very diffuse mild tenderness. Bowel sounds are very active. No masses or organomegaly are noted.Marland Kitchen    EXTREMITIES:  Good range of motion, no edema.     DATA REVIEWED:  I have looked over the notes as well as laboratory and radiology studies from his emergency department visit 3 days ago    [name] J 01/16/2013  CC: Cleora Fleet, MD, Standley Dakins, MD

## 2013-01-17 ENCOUNTER — Other Ambulatory Visit: Payer: Medicare Other

## 2013-01-17 ENCOUNTER — Emergency Department (HOSPITAL_COMMUNITY): Payer: Medicare Other

## 2013-01-17 ENCOUNTER — Encounter (HOSPITAL_COMMUNITY): Payer: Self-pay | Admitting: Emergency Medicine

## 2013-01-17 ENCOUNTER — Inpatient Hospital Stay (HOSPITAL_COMMUNITY)
Admission: EM | Admit: 2013-01-17 | Discharge: 2013-01-19 | DRG: 684 | Disposition: A | Discharge status: Home / Self Care | Payer: Medicare Other | Attending: Internal Medicine | Admitting: Internal Medicine

## 2013-01-17 ENCOUNTER — Telehealth (INDEPENDENT_AMBULATORY_CARE_PROVIDER_SITE_OTHER): Payer: Self-pay | Admitting: Surgery

## 2013-01-17 DIAGNOSIS — M67919 Unspecified disorder of synovium and tendon, unspecified shoulder: Secondary | ICD-10-CM

## 2013-01-17 DIAGNOSIS — R197 Diarrhea, unspecified: Secondary | ICD-10-CM | POA: Diagnosis present

## 2013-01-17 DIAGNOSIS — E669 Obesity, unspecified: Secondary | ICD-10-CM

## 2013-01-17 DIAGNOSIS — E119 Type 2 diabetes mellitus without complications: Secondary | ICD-10-CM | POA: Diagnosis present

## 2013-01-17 DIAGNOSIS — K8020 Calculus of gallbladder without cholecystitis without obstruction: Secondary | ICD-10-CM

## 2013-01-17 DIAGNOSIS — K219 Gastro-esophageal reflux disease without esophagitis: Secondary | ICD-10-CM

## 2013-01-17 DIAGNOSIS — M79609 Pain in unspecified limb: Secondary | ICD-10-CM

## 2013-01-17 DIAGNOSIS — L723 Sebaceous cyst: Secondary | ICD-10-CM

## 2013-01-17 DIAGNOSIS — R748 Abnormal levels of other serum enzymes: Secondary | ICD-10-CM | POA: Diagnosis present

## 2013-01-17 DIAGNOSIS — R109 Unspecified abdominal pain: Secondary | ICD-10-CM

## 2013-01-17 DIAGNOSIS — K5289 Other specified noninfective gastroenteritis and colitis: Secondary | ICD-10-CM | POA: Diagnosis present

## 2013-01-17 DIAGNOSIS — K5900 Constipation, unspecified: Secondary | ICD-10-CM

## 2013-01-17 DIAGNOSIS — N179 Acute kidney failure, unspecified: Principal | ICD-10-CM | POA: Diagnosis present

## 2013-01-17 DIAGNOSIS — D72829 Elevated white blood cell count, unspecified: Secondary | ICD-10-CM | POA: Diagnosis present

## 2013-01-17 DIAGNOSIS — Z9889 Other specified postprocedural states: Secondary | ICD-10-CM

## 2013-01-17 DIAGNOSIS — I10 Essential (primary) hypertension: Secondary | ICD-10-CM | POA: Diagnosis present

## 2013-01-17 DIAGNOSIS — F528 Other sexual dysfunction not due to a substance or known physiological condition: Secondary | ICD-10-CM

## 2013-01-17 DIAGNOSIS — J309 Allergic rhinitis, unspecified: Secondary | ICD-10-CM

## 2013-01-17 DIAGNOSIS — Z79899 Other long term (current) drug therapy: Secondary | ICD-10-CM

## 2013-01-17 DIAGNOSIS — E7800 Pure hypercholesterolemia, unspecified: Secondary | ICD-10-CM | POA: Diagnosis present

## 2013-01-17 DIAGNOSIS — S68118A Complete traumatic metacarpophalangeal amputation of other finger, initial encounter: Secondary | ICD-10-CM

## 2013-01-17 DIAGNOSIS — E860 Dehydration: Secondary | ICD-10-CM | POA: Diagnosis present

## 2013-01-17 DIAGNOSIS — M109 Gout, unspecified: Secondary | ICD-10-CM

## 2013-01-17 DIAGNOSIS — R7989 Other specified abnormal findings of blood chemistry: Secondary | ICD-10-CM

## 2013-01-17 DIAGNOSIS — H547 Unspecified visual loss: Secondary | ICD-10-CM

## 2013-01-17 HISTORY — DX: Type 2 diabetes mellitus without complications: E11.9

## 2013-01-17 HISTORY — DX: Gout, unspecified: M10.9

## 2013-01-17 HISTORY — DX: Gastro-esophageal reflux disease without esophagitis: K21.9

## 2013-01-17 LAB — CREATININE, SERUM
GFR calc Af Amer: 38 mL/min — ABNORMAL LOW (ref >90)
GFR calc non Af Amer: 33 mL/min — ABNORMAL LOW (ref >90)

## 2013-01-17 LAB — CBC
HCT: 45.4 % (ref 39.0–52.0)
MCHC: 36.3 g/dL — ABNORMAL HIGH (ref 30.0–36.0)
MCV: 86 fL (ref 78.0–100.0)
Platelets: 257 10*3/uL (ref 150–400)
RDW: 14.7 % (ref 11.5–15.5)
WBC: 21.2 10*3/uL — ABNORMAL HIGH (ref 4.0–10.5)

## 2013-01-17 LAB — GLUCOSE, CAPILLARY: Glucose-Capillary: 115 mg/dL — ABNORMAL HIGH (ref 70–99)

## 2013-01-17 LAB — HEMOGLOBIN A1C: Mean Plasma Glucose: 114 mg/dL (ref <117)

## 2013-01-17 MED ORDER — ACETAMINOPHEN 325 MG PO TABS: 650.0000 mg | ORAL_TABLET | Freq: Four times a day (QID) | ORAL | Status: DC | PRN

## 2013-01-17 MED ORDER — HYDROMORPHONE HCL PF 1 MG/ML IJ SOLN: 1.0000 mg | INTRAMUSCULAR | Status: AC | PRN

## 2013-01-17 MED ORDER — ENOXAPARIN SODIUM 40 MG/0.4ML ~~LOC~~ SOLN
40.0000 mg | SUBCUTANEOUS | Status: DC
  Administered 2013-01-17 – 2013-01-18 (×2): 40 mg via SUBCUTANEOUS
  Filled 2013-01-17 (×3): qty 0.4

## 2013-01-17 MED ORDER — SODIUM CHLORIDE 0.9 % IJ SOLN: 3.0000 mL | Freq: Two times a day (BID) | INTRAMUSCULAR | Status: DC

## 2013-01-17 MED ORDER — INSULIN ASPART 100 UNIT/ML ~~LOC~~ SOLN: 0.0000 [IU] | Freq: Three times a day (TID) | SUBCUTANEOUS | Status: DC

## 2013-01-17 MED ORDER — ONDANSETRON HCL 4 MG/2ML IJ SOLN: 4.0000 mg | Freq: Four times a day (QID) | INTRAMUSCULAR | Status: DC | PRN

## 2013-01-17 MED ORDER — SODIUM CHLORIDE 0.9 % IV SOLN
INTRAVENOUS | Status: AC
  Administered 2013-01-17: 12:00:00 via INTRAVENOUS

## 2013-01-17 MED ORDER — ACETAMINOPHEN 650 MG RE SUPP: 650.0000 mg | Freq: Four times a day (QID) | RECTAL | Status: DC | PRN

## 2013-01-17 MED ORDER — METRONIDAZOLE 500 MG PO TABS
500.0000 mg | ORAL_TABLET | Freq: Three times a day (TID) | ORAL | Status: DC
  Administered 2013-01-17 – 2013-01-18 (×3): 500 mg via ORAL
  Filled 2013-01-17 (×6): qty 1

## 2013-01-17 MED ORDER — ONDANSETRON HCL 4 MG PO TABS: 4.0000 mg | ORAL_TABLET | Freq: Four times a day (QID) | ORAL | Status: DC | PRN

## 2013-01-17 MED ORDER — SODIUM CHLORIDE 0.9 % IV SOLN
Freq: Once | INTRAVENOUS | Status: AC
  Administered 2013-01-17: 08:00:00 via INTRAVENOUS

## 2013-01-17 MED ORDER — POTASSIUM CHLORIDE IN NACL 20-0.9 MEQ/L-% IV SOLN
INTRAVENOUS | Status: DC
  Administered 2013-01-17 – 2013-01-18 (×3): via INTRAVENOUS
  Filled 2013-01-17 (×5): qty 1000

## 2013-01-17 MED ORDER — ONDANSETRON HCL 4 MG/2ML IJ SOLN: 4.0000 mg | Freq: Three times a day (TID) | INTRAMUSCULAR | Status: AC | PRN

## 2013-01-17 NOTE — ED Notes (Signed)
Delay in insulin and meal.  Meal was delivered and patient cannot have the meat that is on tray d/t gout. New meal tray ordered.

## 2013-01-17 NOTE — ED Notes (Signed)
CBG 115.  Pt will not receive Insulin d/t sliding scale

## 2013-01-17 NOTE — ED Notes (Signed)
Patient was seen here on Saturday and was told he has "gallstones".  He was on his way this morning to the ultrasound and his doctor, Dr. Jamey Ripa told him to come to the ED because his blood work showed he has an infection due to his WBC's are elevated.  The patient is also experiencing new pain on his left side of his abdomen the he describes as a stabbing pain and rates it 8/10.

## 2013-01-17 NOTE — ED Notes (Signed)
Pt CBG 115 

## 2013-01-17 NOTE — H&P (Addendum)
[facility] History and Physical  Manning Luna ZOX:[id number] DOB: 26-Aug-1961 DOA: 01/17/2013  Referring physician: *ER PCP: Standley Dakins, MD   Chief Complaint: Abdominal pain HPI:  52 y.o. male who presents for evaluation of gallstones. He was in the ED today with abdominal pain. A CT scan showed some sludge in the gallbladder but was without evidence of acute abdominal process and no evidence of acute cholecystitis. Notable lab studies included a slightly elevated white count of 19,000 and a slightly elevated lipase. He appeared to be without acute abdomen by physical examination and was sent to Dr. Jamey Ripa followup. He notes that since his emergency department continued to have some mild nausea but is able to keep food down. However he is having diarrhea up to 8-10 times a day and sometimes greenish fluid coming out rather than stool. No blood.also notes his pain is now more left lower quadrant and is not having any right upper quadrant pain at all.  Was started on Flagyl 3 times a day by Dr. Jamey Ripa, with mild improvement. He has since developed nausea and postprandial vomiting      Review of Systems: negative for the following  Constitutional: Denies fever, chills, diaphoresis, appetite change and fatigue.  HEENT: Denies photophobia, eye pain, redness, hearing loss, ear pain, congestion, sore throat, rhinorrhea, sneezing, mouth sores, trouble swallowing, neck pain, neck stiffness and tinnitus.  Respiratory: Denies SOB, DOE, cough, chest tightness, and wheezing.  Cardiovascular: Denies chest pain, palpitations and leg swelling.  Gastrointestinal: Denies nausea, vomiting, abdominal pain, diarrhea, constipation, blood in stool and abdominal distention.  Genitourinary: Denies dysuria, urgency, frequency, hematuria, flank pain and difficulty urinating.  Musculoskeletal: Denies myalgias, back pain, joint swelling, arthralgias and gait problem.  Skin: Denies pallor, rash and wound.   Neurological: Denies dizziness, seizures, syncope, weakness, light-headedness, numbness and headaches.  Hematological: Denies adenopathy. Easy bruising, personal or family bleeding history  Psychiatric/Behavioral: Denies suicidal ideation, mood changes, confusion, nervousness, sleep disturbance and agitation       Past Medical History  Diagnosis Date  . Hypertension   . Gout   . Hypercholesteremia   . Baker's cyst of knee   . Diabetes mellitus without complication      Past Surgical History  Procedure Laterality Date  . Finger amputation    . Knee arthroscopy        Social History:  reports that he has never smoked. He does not have any smokeless tobacco history on file. He reports that  drinks alcohol. He reports that he does not use illicit drugs.    Allergies  Allergen Reactions  . Hydrocodone-Acetaminophen Itching    Family History  Problem Relation Age of Onset  . Diabetes Father   . Hypertension Mother   . Hypertension Father   . Hypertension Sister   . Hypertension Brother   . Hypertension Brother   . Hypertension Brother      Prior to Admission medications   Medication Sig Start Date End Date Taking? Authorizing Provider  allopurinol (ZYLOPRIM) 300 MG tablet Take 1 tablet (300 mg total) by mouth daily. 11/15/12  Yes Alison Murray, MD  atenolol (TENORMIN) 100 MG tablet Take 1 tablet (100 mg total) by mouth 2 (two) times daily. Take two tablets twice daily 11/15/12  Yes Alison Murray, MD  colchicine 0.6 MG tablet Take 0.6 mg by mouth 2 (two) times daily as needed. For gout 11/15/12  Yes Alison Murray, MD  esomeprazole (NEXIUM) 40 MG capsule Take 1 capsule (40  mg total) by mouth daily before breakfast. 11/15/12  Yes Alison Murray, MD  hydrochlorothiazide (HYDRODIURIL) 25 MG tablet Take 1 tablet (25 mg total) by mouth daily. 11/15/12  Yes Alison Murray, MD  lisinopril (PRINIVIL,ZESTRIL) 40 MG tablet Take 1 tablet (40 mg total) by mouth daily. 11/15/12  Yes Alison Murray, MD  loratadine (CLARITIN) 10 MG tablet Take 1 tablet (10 mg total) by mouth daily. 11/15/12  Yes Alison Murray, MD  metFORMIN (GLUCOPHAGE) 500 MG tablet Take 1 tablet (500 mg total) by mouth 2 (two) times daily with a meal. 11/15/12  Yes Alison Murray, MD  metoCLOPramide (REGLAN) 10 MG tablet Take 1 tablet (10 mg total) by mouth every 6 (six) hours as needed. 01/13/13  Yes Gerhard Munch, MD  metroNIDAZOLE (FLAGYL) 500 MG tablet Take 1 tablet (500 mg total) by mouth 3 (three) times daily. 01/16/13  Yes Currie Paris, MD  traMADol (ULTRAM) 50 MG tablet Take 1 tablet (50 mg total) by mouth every 6 (six) hours as needed for pain. 11/15/12  Yes Alison Murray, MD     Physical Exam: Filed Vitals:   01/17/13 0741  BP: 104/71  Pulse: 67  Temp: 98.6 F (37 C)  TempSrc: Oral  Resp: 18  SpO2: 100%     Constitutional: Vital signs reviewed. Patient is a well-developed and well-nourished in no acute distress and cooperative with exam. Alert and oriented x3.  Head: Normocephalic and atraumatic  Ear: TM normal bilaterally  Mouth: no erythema or exudates, MMM  Eyes: PERRL, EOMI, conjunctivae normal, No scleral icterus.  Neck: Supple, Trachea midline normal ROM, No JVD, mass, thyromegaly, or carotid bruit present.  Cardiovascular: RRR, S1 normal, S2 normal, no MRG, pulses symmetric and intact bilaterally  Pulmonary/Chest: CTAB, no wheezes, rales, or rhonchi  Abdominal: Soft. He exhibits no distension. There is no hepatosplenomegaly. There is tenderness in the epigastric area. There is guarding. There is no rigidity, no rebound, no CVA tenderness, no tenderness at McBurney's point and negative Murphy's sign.  Non-peritoneal abd, w no pain on percussion, and soft w deep palpation when distracted. Musculoskeletal: No joint deformities, erythema, or stiffness, ROM full and no nontender Ext: no edema and no cyanosis, pulses palpable bilaterally (DP and PT)  Hematology: no cervical, inginal, or  axillary adenopathy.  Neurological: A&O x3, Strenght is normal and symmetric bilaterally, cranial nerve II-XII are grossly intact, no focal motor deficit, sensory intact to light touch bilaterally.  Skin: Warm, dry and intact. No rash, cyanosis, or clubbing.  Psychiatric: Normal mood and affect. speech and behavior is normal. Judgment and thought content normal. Cognition and memory are normal.       Labs on Admission:    Basic Metabolic Panel:  Recent Labs Lab 01/13/13 1611 01/16/13 1639  NA 138 132*  K 3.9 3.9  CL 101 95*  CO2 24 21  GLUCOSE 124* 89  BUN 17 30*  CREATININE 1.05 2.01*  CALCIUM 10.1 10.4   Liver Function Tests:  Recent Labs Lab 01/13/13 1611 01/16/13 1639  AST 24 22  ALT 10 9  ALKPHOS 92 110  BILITOT 0.2* 0.4  PROT 7.8 8.6*  ALBUMIN 3.8 4.9    Recent Labs Lab 01/13/13 1611 01/16/13 1639  LIPASE 79* 88*   No results found for this basename: AMMONIA,  in the last 168 hours CBC:  Recent Labs Lab 01/13/13 1611 01/16/13 1639  WBC 13.6* 19.3*  NEUTROABS 6.4  --   HGB 16.5 17.4*  HCT 46.0 48.6  MCV 88.3 87.7  PLT 326 335   Cardiac Enzymes: No results found for this basename: CKTOTAL, CKMB, CKMBINDEX, TROPONINI,  in the last 168 hours  BNP (last 3 results) No results found for this basename: PROBNP,  in the last 8760 hours    CBG: No results found for this basename: GLUCAP,  in the last 168 hours  Radiological Exams on Admission: US Abdomen Complete  01/17/2013   *RADIOLOGY REPORT*  Clinical Data:  Abdominal pain and hypertension.  COMPLETE ABDOMINAL ULTRASOUND  Comparison:  CT 01/13/2013  Findings:  Gallbladder:  Gallstones and gallbladder sludge.  No wall thickening or pericholecystic fluid. Sonographic Murphy's sign was not elicited.  Common bile duct: Normal, 6 mm.  Liver: Mildly heterogeneously hyperechoic.  IVC: Negative  Pancreas:  Poorly visualized due to overlying bowel gas.  Spleen:  Poorly visualized due to overlying bowel  gas.  Right Kidney:  11.3 cm. No hydronephrosis.  Left Kidney:  11.1 cm. No hydronephrosis.  Abdominal aorta:  Nonaneurysmal without ascites.  IMPRESSION: Gallstones and gallbladder sludge, without acute cholecystitis or other explanation for abdominal pain.   Original Report Authenticated By: Jeronimo Greaves, M.D.    EKG: Independently reviewed. None  Assessment/Plan  Leukocytosis  likely secondary to gastroenteritis., Could be C. difficile, will continue empiric Flagyl, repeat C. difficile PCR  Dehydration/acute kidney injury Hydrate the patient with IV fluids  Abdominal pain likely secondary to gastroenteritis, likely slightly elevated however CT negative for acute pancreatitis, patient has gallstones but no evidence of acute cholecystitis.    Code Status:   full Family Communication: bedside Disposition Plan: admit   Time spent: 70 mins   [facility] Pager [phone]  If 7PM-7AM, please contact night-coverage [email or web address] Password [email or web address] 01/17/2013, 10:01 AM

## 2013-01-17 NOTE — ED Notes (Signed)
Family at bedside. 

## 2013-01-17 NOTE — ED Notes (Signed)
Meal ordered for patient.

## 2013-01-17 NOTE — ED Provider Notes (Signed)
History     CSN: [id number]  Arrival date & time 01/17/13  4098   First MD Initiated Contact with Patient 01/17/13 [phone]      Chief Complaint  Patient presents with  . Abdominal Pain    (Consider location/radiation/quality/duration/timing/severity/associated sxs/prior treatment) HPI  52 year old male with a past medical history of hypertension, hypercholesterolemia, diabetes presents emergency department with chief complaint of diarrhea.  The patient was seen 01/13/2013 with same complaint.  CT scan showed cholelithiasis and biliary sludge without evidence of cholecystitis.  Patient salt Dr. hysterectomy at [facility] surgery yesterday.  He was felt to have possible gastroenteritis and had future scheduling for gallbladder ultrasound.  Patient's lab work returned with elevated white count, elevation of creatinine, and worsening elevation in his lipase.  Patient was sent to the emergency department for further evaluation.. patient denies abdominal pain and last abdomen is palpated.  He denies any nausea or vomiting.  He states that he is making 10-12 watery diarrheas a day.  He feels dehydrated.. Denies fevers, chills, myalgias, arthralgias. Denies DOE, SOB, chest tightness or pressure, radiation to left arm, jaw or back, or diaphoresis. Denies dysuria, flank pain, suprapubic pain, frequency, urgency, or hematuria. Denies headaches, light headedness, weakness, visual disturbances.  Past Medical History  Diagnosis Date  . Hypertension   . Gout   . Hypercholesteremia   . Baker's cyst of knee   . Diabetes mellitus without complication     Past Surgical History  Procedure Laterality Date  . Finger amputation    . Knee arthroscopy      Family History  Problem Relation Age of Onset  . Diabetes Father   . Hypertension Mother   . Hypertension Father   . Hypertension Sister   . Hypertension Brother   . Hypertension Brother   . Hypertension Brother     History  Substance Use  Topics  . Smoking status: Never Smoker   . Smokeless tobacco: Not on file  . Alcohol Use: Yes     Comment: occasional      Review of Systems  Ten systems reviewed and are negative for acute change, except as noted in the HPI.   Allergies  Hydrocodone-acetaminophen  Home Medications   Current Outpatient Rx  Name  Route  Sig  Dispense  Refill  . allopurinol (ZYLOPRIM) 300 MG tablet   Oral   Take 1 tablet (300 mg total) by mouth daily.   30 tablet   1   . atenolol (TENORMIN) 100 MG tablet   Oral   Take 1 tablet (100 mg total) by mouth 2 (two) times daily. Take two tablets twice daily   60 tablet   3   . colchicine 0.6 MG tablet   Oral   Take 0.6 mg by mouth 2 (two) times daily as needed. For gout         . esomeprazole (NEXIUM) 40 MG capsule   Oral   Take 1 capsule (40 mg total) by mouth daily before breakfast.   30 capsule   3   . hydrochlorothiazide (HYDRODIURIL) 25 MG tablet   Oral   Take 1 tablet (25 mg total) by mouth daily.   30 tablet   3   . lisinopril (PRINIVIL,ZESTRIL) 40 MG tablet   Oral   Take 1 tablet (40 mg total) by mouth daily.   30 tablet   3   . loratadine (CLARITIN) 10 MG tablet   Oral   Take 1 tablet (10 mg total) by  mouth daily.   30 tablet   3   . metFORMIN (GLUCOPHAGE) 500 MG tablet   Oral   Take 1 tablet (500 mg total) by mouth 2 (two) times daily with a meal.   60 tablet   3   . metoCLOPramide (REGLAN) 10 MG tablet   Oral   Take 1 tablet (10 mg total) by mouth every 6 (six) hours as needed.   15 tablet   0   . metroNIDAZOLE (FLAGYL) 500 MG tablet   Oral   Take 1 tablet (500 mg total) by mouth 3 (three) times daily.   30 tablet   2   . traMADol (ULTRAM) 50 MG tablet   Oral   Take 1 tablet (50 mg total) by mouth every 6 (six) hours as needed for pain.   30 tablet   1     BP 104/71  Pulse 67  Temp(Src) 98.6 F (37 C) (Oral)  Resp 18  SpO2 100%  Physical Exam Physical Exam  Nursing note and vitals  reviewed. Constitutional: He appears well-developed and well-nourished. No distress.  HENT:  Head: Normocephalic and atraumatic.  Eyes: Conjunctivae normal are normal. No scleral icterus.  Neck: Normal range of motion. Neck supple.  Cardiovascular: Normal rate, regular rhythm and normal heart sounds.   Pulmonary/Chest: Effort normal and breath sounds normal. No respiratory distress.  Abdominal: Soft abdomen. There is guarding. No peritoneal signs or rebound. Tenderness is worst in the LLQ. Musculoskeletal: He exhibits no edema.  Neurological: He is alert.  Skin: Skin is warm and dry. He is not diaphoretic.  Psychiatric: His behavior is normal.    ED Course  Procedures (including critical care time)  Labs Reviewed - No data to display No results found.   1. Diarrhea   2. AKI (acute kidney injury)   3. Leukocytosis   4. Elevated lipase       MDM  8:11 AM Patient with several days of diarrhea. He appears to have acute kidney injury and dehydration. I have spoken with Dr. Jamey Ripa who called the patient at home for worsening lab values./ AKI/ Dr. Jamey Ripa is sceptical that this is a surgical problem. Concern for enteritis. Patient will receive Fluids. Abdominal US to r/o gallbladder as etiology.   9:42 AM Patient [place] confirms biliary sludge and cholelithiasis. No acute abdominal findings. Patient does have AKI and leukocytosis. I will call [facility] for [facility] admission for his creatinine /dehydration. I have informed the patient who agrees with plan of care. Patient is receiving fluid bolus.  10:02 AM BP 104/71  Pulse 67  Temp(Src) 98.6 F (37 C) (Oral)  Resp 18  SpO2 100% I spoke with Dr. Susie Cassette who will admit the patient for his AKI      Arthor Captain, PA-C 01/17/13 1003

## 2013-01-17 NOTE — ED Provider Notes (Signed)
Medical screening examination/treatment/procedure(s) were performed by non-physician practitioner and as supervising physician I was immediately available for consultation/collaboration.  4 day history of diffuse abdominal pain with nausea and diarrhea. Recent CT with biliary sludge.  Elevated WBC and Cr.  Mildly dry mucus membranes, abdomen soft and nonperitoneal.  Suspect gastroenteritis, possible C dif.   Glynn Octave, MD 01/17/13 [phone]

## 2013-01-17 NOTE — ED Notes (Signed)
Waiting on lunch to be delivered

## 2013-01-17 NOTE — Telephone Encounter (Signed)
Labs noted. He is still having diarrhea about ten times last night. With increase in wbc and creat he needs to be re-eval in ED and possibly admitted for IVF. Can have GB sono here instead of OP. This looks more like some enteritis than cholecystitis.The cty showed stones but no evidence of acute cholecystitis

## 2013-01-18 DIAGNOSIS — R109 Unspecified abdominal pain: Secondary | ICD-10-CM

## 2013-01-18 DIAGNOSIS — N179 Acute kidney failure, unspecified: Principal | ICD-10-CM

## 2013-01-18 DIAGNOSIS — R748 Abnormal levels of other serum enzymes: Secondary | ICD-10-CM

## 2013-01-18 LAB — GLUCOSE, CAPILLARY
Glucose-Capillary: 101 mg/dL — ABNORMAL HIGH (ref 70–99)
Glucose-Capillary: 97 mg/dL (ref 70–99)
Glucose-Capillary: 124 mg/dL — ABNORMAL HIGH (ref 70–99)

## 2013-01-18 LAB — COMPREHENSIVE METABOLIC PANEL
ALT: 8 U/L (ref 0–53)
Alkaline Phosphatase: 96 U/L (ref 39–117)
BUN: 35 mg/dL — ABNORMAL HIGH (ref 6–23)
CO2: 19 mEq/L (ref 19–32)
Calcium: 9.6 mg/dL (ref 8.4–10.5)
GFR calc Af Amer: 62 mL/min — ABNORMAL LOW (ref >90)
GFR calc non Af Amer: 54 mL/min — ABNORMAL LOW (ref >90)
Glucose, Bld: 96 mg/dL (ref 70–99)
Potassium: 3.8 mEq/L (ref 3.5–5.1)
Total Protein: 7.7 g/dL (ref 6.0–8.3)

## 2013-01-18 LAB — CBC
HCT: 42.3 % (ref 39.0–52.0)
MCHC: 36.6 g/dL — ABNORMAL HIGH (ref 30.0–36.0)
RDW: 14.7 % (ref 11.5–15.5)
WBC: 17.1 10*3/uL — ABNORMAL HIGH (ref 4.0–10.5)

## 2013-01-18 LAB — CLOSTRIDIUM DIFFICILE BY PCR: Toxigenic C. Difficile by PCR: NEGATIVE

## 2013-01-18 MED ORDER — METRONIDAZOLE IN NACL 5-0.79 MG/ML-% IV SOLN
500.0000 mg | Freq: Three times a day (TID) | INTRAVENOUS | Status: DC
  Administered 2013-01-18 – 2013-01-19 (×4): 500 mg via INTRAVENOUS
  Filled 2013-01-18 (×5): qty 100

## 2013-01-18 MED ORDER — SACCHAROMYCES BOULARDII 250 MG PO CAPS
250.0000 mg | ORAL_CAPSULE | Freq: Two times a day (BID) | ORAL | Status: DC
  Administered 2013-01-18 – 2013-01-19 (×3): 250 mg via ORAL
  Filled 2013-01-18 (×4): qty 1

## 2013-01-18 MED ORDER — CIPROFLOXACIN IN D5W 400 MG/200ML IV SOLN
400.0000 mg | Freq: Two times a day (BID) | INTRAVENOUS | Status: DC
  Administered 2013-01-18 – 2013-01-19 (×3): 400 mg via INTRAVENOUS
  Filled 2013-01-18 (×4): qty 200

## 2013-01-18 MED ORDER — LOPERAMIDE HCL 2 MG PO CAPS: 2.0000 mg | ORAL_CAPSULE | ORAL | Status: DC | PRN

## 2013-01-18 NOTE — Progress Notes (Signed)
[facility] PROGRESS NOTE  Abed Schar ZOX:[id number] DOB: 11-Jul-1961 DOA: 01/17/2013 PCP: Standley Dakins, MD   52 y.o. male who presents for evaluation of gallstones. He was in the ED today with abdominal pain. A CT scan showed some sludge in the gallbladder but was without evidence of acute abdominal process and no evidence of acute cholecystitis. Notable lab studies included a slightly elevated white count of 19,000 and a slightly elevated lipase. He appeared to be without acute abdomen by physical examination and was sent to Dr. Jamey Ripa followup. He notes that since his emergency department continued to have some mild nausea but is able to keep food down. However he is having diarrhea up to 8-10 times a day and sometimes greenish fluid coming out rather than stool. No blood.also notes his pain is now more left lower quadrant and is not having any right upper quadrant pain at all.  Was started on Flagyl 3 times a day by Dr. Jamey Ripa, with mild improvement. He has since developed nausea and postprandial vomiting  Assessment/Plan:  Diarrhea (with 1 episode of vomiting after starting flagyl) Patient reports 2 weeks of diarrhea with 10 lb weight loss Diarrhea decreasing, pain decreased. 4 BMs this am.   C-diff negative. Start Pro-biotic On Cipro/Flagyl.  Changed to IV as PO flagyl can cause nausea and vomiting.  acute kidney injury  Creatinine 2.17, baseline is 1.0 Secondary to diarrhea and dehydration. Improving with IV hydration.   Abdominal pain Likely secondary to gastroenteritis,  Lipase 88 CT negative for acute pancreatitis,  patient has gallstones but no evidence of acute cholecystitis.   DM Metformin held SSI  HTN BP with in normal range HCTZ, Ace-I held.   DVT Prophylaxis:  lovenox  Code Status: full Family Communication:  Disposition Plan: Likely home 5/16   Consultants:    Procedures:    Antibiotics:  cipro / flagyl  HPI/Subjective: 4 diarrhea  BM this morning.  Abdominal pain relieved.  Objective: Filed Vitals:   01/17/13 1314 01/17/13 1555 01/17/13 2155 01/18/13 0500  BP: 103/63 104/69 119/76 109/79  Pulse: 69 71 86 78  Temp: 98.5 F (36.9 C) 98.1 F (36.7 C) 98.4 F (36.9 C) 98.5 F (36.9 C)  TempSrc: Oral Oral Oral Oral  Resp: 16 18 18 18   Height:  5\' 7"  (1.702 m)    Weight:  92.715 kg (204 lb 6.4 oz)    SpO2: 100% 96% 96% 98%    Intake/Output Summary (Last 24 hours) at 01/18/13 1352 Last data filed at 01/18/13 0920  Gross per 24 hour  Intake 1098.25 ml  Output    400 ml  Net 698.25 ml   Filed Weights   01/17/13 1555  Weight: 92.715 kg (204 lb 6.4 oz)    Exam:   General:  A&O, NAD, Lying comfortably in bed  Cardiovascular: RRR, no murmurs, rubs or gallops, no lower extremity edema  Respiratory: CTA, no wheeze, crackles, or rales.  No increased work of breathing.  Abdomen: obese, Soft, TTP in LLQ, non-distended, + bowel sounds, no masses  Musculoskeletal: Able to move all 4 extremities, 5/5 strength in each  Data Reviewed: Basic Metabolic Panel:  Recent Labs Lab 01/13/13 1611 01/16/13 1639 01/17/13 1020 01/18/13 0540  NA 138 132*  --  132*  K 3.9 3.9  --  3.8  CL 101 95*  --  98  CO2 24 21  --  19  GLUCOSE 124* 89  --  96  BUN 17 30*  --  35*  CREATININE 1.05 2.01* 2.17* 1.45*  CALCIUM 10.1 10.4  --  9.6   Liver Function Tests:  Recent Labs Lab 01/13/13 1611 01/16/13 1639 01/18/13 0540  AST 24 22 18   ALT 10 9 8   ALKPHOS 92 110 96  BILITOT 0.2* 0.4 0.3  PROT 7.8 8.6* 7.7  ALBUMIN 3.8 4.9 3.5    Recent Labs Lab 01/13/13 1611 01/16/13 1639  LIPASE 79* 88*   CBC:  Recent Labs Lab 01/13/13 1611 01/16/13 1639 01/17/13 1020 01/18/13 0540  WBC 13.6* 19.3* 21.2* 17.1*  NEUTROABS 6.4  --   --   --   HGB 16.5 17.4* 16.5 15.5  HCT 46.0 48.6 45.4 42.3  MCV 88.3 87.7 86.0 85.8  PLT 326 335 257 251   Cardiac Enzymes:  Recent Labs Lab 01/17/13 1020  TROPONINI <0.30    CBG:  Recent Labs Lab 01/17/13 1337 01/17/13 1728 01/17/13 2152 01/18/13 0803 01/18/13 1225  GLUCAP 115* 102* 104* 101* 108*    Recent Results (from the past 240 hour(s))  STOOL CULTURE     Status: None   Collection Time    01/16/13  4:39 PM      Result Value Range Status   Preliminary Report Culture reincubated for better growth   Preliminary  CLOSTRIDIUM DIFFICILE BY PCR     Status: None   Collection Time    01/18/13  6:56 AM      Result Value Range Status   C difficile by pcr NEGATIVE  NEGATIVE Final     Studies: US Abdomen Complete  01/17/2013   *RADIOLOGY REPORT*  Clinical Data:  Abdominal pain and hypertension.  COMPLETE ABDOMINAL ULTRASOUND  Comparison:  CT 01/13/2013  Findings:  Gallbladder:  Gallstones and gallbladder sludge.  No wall thickening or pericholecystic fluid. Sonographic Murphy's sign was not elicited.  Common bile duct: Normal, 6 mm.  Liver: Mildly heterogeneously hyperechoic.  IVC: Negative  Pancreas:  Poorly visualized due to overlying bowel gas.  Spleen:  Poorly visualized due to overlying bowel gas.  Right Kidney:  11.3 cm. No hydronephrosis.  Left Kidney:  11.1 cm. No hydronephrosis.  Abdominal aorta:  Nonaneurysmal without ascites.  IMPRESSION: Gallstones and gallbladder sludge, without acute cholecystitis or other explanation for abdominal pain.   Original Report Authenticated By: Jeronimo Greaves, M.D.    Scheduled Meds: . ciprofloxacin  400 mg Intravenous Q12H  . enoxaparin (LOVENOX) injection  40 mg Subcutaneous Q24H  . insulin aspart  0-15 Units Subcutaneous TID WC  . metronidazole  500 mg Intravenous Q8H  . saccharomyces boulardii  250 mg Oral BID  . sodium chloride  3 mL Intravenous Q12H   Continuous Infusions: . 0.9 % NaCl with KCl 20 mEq / L 75 mL/hr at 01/18/13 0153    Principal Problem:   Acute kidney injury Active Problems:   HYPERTENSION   Diarrhea    Stephani Police  [facility] Pager [phone]. If 7PM-7AM, please  contact night-coverage at [email or web address], password [facility] 01/18/2013, 1:52 PM  LOS: 1 day

## 2013-01-18 NOTE — Progress Notes (Signed)
Addendum  Patient seen and examined, chart and data base reviewed.  I agree with the above assessment and plan.  For full details please see Mrs. Algis Downs PA note.  Abdominal pain, diarrhea and leukocytosis but negative for C. difficile.  Continue Cipro and Flagyl for now.   Clint Lipps, MD [facility] Pager: [phone] 01/18/2013, 2:11 PM

## 2013-01-18 NOTE — Progress Notes (Signed)
Brief Nutrition Note:  RD pulled to pt for malnutrition screening tool report of unintentional weight loss.  Pt with admitted with abdominal pain and diarrhea. Pt states weight loss is related to increased bowel movements. Pt also states he has been trying to lose weight by changing his diet. Reports good appetite, eating 100% of meals at this time.    Wt Readings from Last 5 Encounters:  01/17/13 204 lb 6.4 oz (92.715 kg)  01/16/13 204 lb 6.4 oz (92.715 kg)  10/27/11 240 lb (108.863 kg)  10/09/10 213 lb 12.8 oz (96.979 kg)  07/09/10 208 lb 8 oz (94.575 kg)    Body mass index is 32.01 kg/(m^2). Obesity class 2   Chart reviewed, no nutrition interventions warranted at this time. Please consult as needed.   Clarene Duke RD, LDN Pager [phone] After Hours pager [phone]

## 2013-01-18 NOTE — Care Management Note (Signed)
    Page 1 of 1   01/19/2013     11:01:40 AM   CARE MANAGEMENT NOTE 01/19/2013  Patient:  [name], [name]   Account Number:  [id number]  Date Initiated:  01/18/2013  Documentation initiated by:  Letha Cape  Subjective/Objective Assessment:   dx htn, abd pain, diarrhea  admit- lives with spouse.     Action/Plan:   Anticipated DC Date:  01/19/2013   Anticipated DC Plan:  HOME/SELF CARE      DC Planning Services  CM consult      Choice offered to / List presented to:             Status of service:  Completed, signed off Medicare Important Message given?   (If response is "NO", the following Medicare IM given date fields will be blank) Date Medicare IM given:   Date Additional Medicare IM given:    Discharge Disposition:  HOME/SELF CARE  Per UR Regulation:  Reviewed for med. necessity/level of care/duration of stay  If discussed at Long Length of Stay Meetings, dates discussed:    Comments:  01/19/13 10:57 Letha Cape RN, BSN [phone] patient dc to home today no needs anticipated.  01/18/13 11:58 Letha Cape RN, BSN [phone] patient lives with spouse, NCM  will continue to follow for dc needs.

## 2013-01-19 LAB — COMPREHENSIVE METABOLIC PANEL
ALT: 11 U/L (ref 0–53)
Alkaline Phosphatase: 106 U/L (ref 39–117)
BUN: 19 mg/dL (ref 6–23)
CO2: 17 mEq/L — ABNORMAL LOW (ref 19–32)
Chloride: 102 mEq/L (ref 96–112)
GFR calc Af Amer: 81 mL/min — ABNORMAL LOW (ref >90)
GFR calc non Af Amer: 70 mL/min — ABNORMAL LOW (ref >90)
Glucose, Bld: 101 mg/dL — ABNORMAL HIGH (ref 70–99)
Potassium: 4 mEq/L (ref 3.5–5.1)
Sodium: 133 mEq/L — ABNORMAL LOW (ref 135–145)
Total Bilirubin: 0.2 mg/dL — ABNORMAL LOW (ref 0.3–1.2)

## 2013-01-19 MED ORDER — CIPROFLOXACIN HCL 750 MG PO TABS: 750.0000 mg | ORAL_TABLET | Freq: Two times a day (BID) | ORAL | Status: DC

## 2013-01-19 NOTE — Discharge Summary (Signed)
Physician Discharge Summary  Jaclyn Carew ZOX:[id number] DOB: 23-Oct-1960 DOA: 01/17/2013  PCP: Standley Dakins, MD  Admit date: 01/17/2013 Discharge date: 01/19/2013  Time spent: 35  minutes  Recommendations for Outpatient Follow-up:  Check Bmet in 1-2 weeks.  Hospitalized with acute kidney failure due to diarrhea. Needs screening colonoscopy  Discharge Diagnoses:  Principal Problem:   Acute kidney injury Active Problems:   HYPERTENSION   Diarrhea   Discharge Condition: stable, tolerating diet, diarrhea significantly decreased.  Creatinine normalized.  Diet recommendation: bland diabetic diet until diarrhea completely resolves  [employer]   01/17/13 1555  Weight: 92.715 kg (204 lb 6.4 oz)    History of present illness:  52 y.o. male who presents for evaluation of gallstones. He was in the ED today with abdominal pain. A CT scan showed some sludge in the gallbladder but was without evidence of acute abdominal process and no evidence of acute cholecystitis. Notable lab studies included a slightly elevated white count of 19,000 and a slightly elevated lipase. He appeared to be without acute abdomen by physical examination and was sent to Dr. Jamey Ripa followup. He notes that since his emergency department continued to have some mild nausea but is able to keep food down. However he is having diarrhea up to 8-10 times a day and sometimes greenish fluid coming out rather than stool. No blood.also his pain is now more left lower quadrant and is not having any right upper quadrant pain at all.  Was started on Flagyl 3 times a day by Dr. Jamey Ripa, with mild improvement. He has since developed nausea and postprandial vomiting.  Hospital Course:   Diarrhea (with 1 episode of vomiting after starting flagyl)  Patient reports 2 weeks of diarrhea with 10 lb weight loss  Diarrhea decreasing, pain decreased. 4 BMs this am.  C-diff negative.  On Cipro/Flagyl. Changed to IV inpatient as PO flagyl can  cause nausea and vomiting.  Will be discharged on Cipro alone.  Total antibiotic course of 10 days. Follow up with PCP for bmet in 1-2 weeks.  acute kidney injury  Creatinine peaked at 2.17, now trending down.  baseline is 1.0  Secondary to diarrhea and dehydration.   Improved with IV fluids and holding of nephrotoxic meds. Will restart medications at discharge (metformin, allopurinol, HCTZ,  lisinopril).  Abdominal pain  Likely secondary to gastroenteritis, now resolved. Lipase 88 trending down to 65 on the morning of discharge. CT negative for acute pancreatitis,  patient has gallstones but no evidence of acute cholecystitis.   DM  Metformin held inpatient. Treated with sliding scale. Resume metformin at DC.   HTN  BP with in normal range  HCTZ, Ace-I held, but resumed on DC.  Discharge Exam: Filed Vitals:   01/18/13 0500 01/18/13 1300 01/18/13 2053 01/19/13 0450  BP: 109/79 140/92 141/82 120/69  Pulse: 78 79 83 83  Temp: 98.5 F (36.9 C) 98.6 F (37 C) 98.6 F (37 C) 98.8 F (37.1 C)  TempSrc: Oral Oral Oral Oral  Resp: 18 20 18 18   Height:      Weight:      SpO2: 98% 99% 98% 97%    General: A&O, NAD, Pleasant, just finished breakfast. Cardiovascular: rrr no m/r/g Respiratory: cta no w/c/r Abdomen:  Soft, Obese, Nt, Nd, +BS, no masses Extremities:  Able to move all 4, 5/5 strength in each.  Discharge Instructions      Discharge Orders   Future Orders Complete By Expires     Diet - low  sodium heart healthy  As directed     Increase activity slowly  As directed         Medication List    STOP taking these medications       metoCLOPramide 10 MG tablet  Commonly known as:  REGLAN     metroNIDAZOLE 500 MG tablet  Commonly known as:  FLAGYL      TAKE these medications       allopurinol 300 MG tablet  Commonly known as:  ZYLOPRIM  Take 1 tablet (300 mg total) by mouth daily.     atenolol 100 MG tablet  Commonly known as:  TENORMIN  Take 1  tablet (100 mg total) by mouth 2 (two) times daily. Take two tablets twice daily     ciprofloxacin 750 MG tablet  Commonly known as:  CIPRO  Take 1 tablet (750 mg total) by mouth 2 (two) times daily.     colchicine 0.6 MG tablet  Take 0.6 mg by mouth 2 (two) times daily as needed. For gout     esomeprazole 40 MG capsule  Commonly known as:  NEXIUM  Take 1 capsule (40 mg total) by mouth daily before breakfast.     hydrochlorothiazide 25 MG tablet  Commonly known as:  HYDRODIURIL  Take 1 tablet (25 mg total) by mouth daily.     lisinopril 40 MG tablet  Commonly known as:  PRINIVIL,ZESTRIL  Take 1 tablet (40 mg total) by mouth daily.     loratadine 10 MG tablet  Commonly known as:  CLARITIN  Take 1 tablet (10 mg total) by mouth daily.     metFORMIN 500 MG tablet  Commonly known as:  GLUCOPHAGE  Take 1 tablet (500 mg total) by mouth 2 (two) times daily with a meal.     traMADol 50 MG tablet  Commonly known as:  ULTRAM  Take 1 tablet (50 mg total) by mouth every 6 (six) hours as needed for pain.       Allergies  Allergen Reactions  . Hydrocodone-Acetaminophen Itching   Follow-up Information   Follow up with Standley Dakins, MD. Schedule an appointment as soon as possible for a visit in 2 weeks.   Contact information:   [address] [place] [address] [phone]        The results of significant diagnostics from this hospitalization (including imaging, microbiology, ancillary and laboratory) are listed below for reference.    Significant Diagnostic Studies: US Abdomen Complete  01/17/2013   *RADIOLOGY REPORT*  Clinical Data:  Abdominal pain and hypertension.  COMPLETE ABDOMINAL ULTRASOUND  Comparison:  CT 01/13/2013  Findings:  Gallbladder:  Gallstones and gallbladder sludge.  No wall thickening or pericholecystic fluid. Sonographic Murphy's sign was not elicited.  Common bile duct: Normal, 6 mm.  Liver: Mildly heterogeneously hyperechoic.  IVC: Negative   Pancreas:  Poorly visualized due to overlying bowel gas.  Spleen:  Poorly visualized due to overlying bowel gas.  Right Kidney:  11.3 cm. No hydronephrosis.  Left Kidney:  11.1 cm. No hydronephrosis.  Abdominal aorta:  Nonaneurysmal without ascites.  IMPRESSION: Gallstones and gallbladder sludge, without acute cholecystitis or other explanation for abdominal pain.   Original Report Authenticated By: Jeronimo Greaves, M.D.   Ct Abdomen Pelvis W Contrast  01/13/2013   *RADIOLOGY REPORT*  Clinical Data: Right upper quadrant abdominal pain, nausea and vomiting  CT ABDOMEN AND PELVIS WITH CONTRAST  Technique:  Multidetector CT imaging of the abdomen and pelvis was performed following the  standard protocol during bolus administration of intravenous contrast.  Contrast: OMNIPAQUE IOHEXOL 300 MG/ML  SOLN  Comparison: 01/08/2009  Findings: Lung bases are clear.  Mild hepatic hypodensity compatible steatosis noted.  Layering gallstones/sludge within the otherwise normal appearing gallbladder.  Minimal fat sparing about the gallbladder fossa is noted.  Adrenal glands, kidneys, spleen, pancreas are unremarkable. Retroaortic left renal vein incidentally noted.  Mild atheromatous aortic calcification without aneurysm.  8 mm short axis lymph node is stable.  No pancreatic duct or common duct dilatation.  No free air or ascites.  Bladder is normal.  No bowel wall thickening or focal segmental dilatation. Scattered colonic diverticuli noted without evidence for diverticulitis.  The appendix is normal.  L5 S1 disc degenerative change noted.  IMPRESSION: No acute intra-abdominal or pelvic pathology.  Gallstones without other CT evidence for acute cholecystitis.   Original Report Authenticated By: Christiana Pellant, M.D.    Microbiology: Recent Results (from the past 240 hour(s))  STOOL CULTURE     Status: None   Collection Time    01/16/13  4:39 PM      Result Value Range Status   Preliminary Report No Suspicious Colonies,  Continuing to Hold   Preliminary  CLOSTRIDIUM DIFFICILE BY PCR     Status: None   Collection Time    01/18/13  6:56 AM      Result Value Range Status   C difficile by pcr NEGATIVE  NEGATIVE Final     Labs: Basic Metabolic Panel:  Recent Labs Lab 01/13/13 1611 01/16/13 1639 01/17/13 1020 01/18/13 0540 01/19/13 0540  NA 138 132*  --  132* 133*  K 3.9 3.9  --  3.8 4.0  CL 101 95*  --  98 102  CO2 24 21  --  19 17*  GLUCOSE 124* 89  --  96 101*  BUN 17 30*  --  35* 19  CREATININE 1.05 2.01* 2.17* 1.45* 1.17  CALCIUM 10.1 10.4  --  9.6 9.1   Liver Function Tests:  Recent Labs Lab 01/13/13 1611 01/16/13 1639 01/18/13 0540 01/19/13 0540  AST 24 22 18 30   ALT 10 9 8 11   ALKPHOS 92 110 96 106  BILITOT 0.2* 0.4 0.3 0.2*  PROT 7.8 8.6* 7.7 7.3  ALBUMIN 3.8 4.9 3.5 3.1*    Recent Labs Lab 01/13/13 1611 01/16/13 1639 01/18/13 1427 01/19/13 0540  LIPASE 79* 88* 69* 65*   CBC:  Recent Labs Lab 01/13/13 1611 01/16/13 1639 01/17/13 1020 01/18/13 0540  WBC 13.6* 19.3* 21.2* 17.1*  NEUTROABS 6.4  --   --   --   HGB 16.5 17.4* 16.5 15.5  HCT 46.0 48.6 45.4 42.3  MCV 88.3 87.7 86.0 85.8  PLT 326 335 257 251   Cardiac Enzymes:  Recent Labs Lab 01/17/13 1020  TROPONINI <0.30   CBG:  Recent Labs Lab 01/18/13 0803 01/18/13 1225 01/18/13 1720 01/18/13 2049 01/19/13 0748  GLUCAP 101* 108* 97 124* 110*       Signed:  Stephani Police, PA-C [facility] 01/19/2013, 9:14 AM

## 2013-01-19 NOTE — Discharge Summary (Signed)
Addendum  Patient seen and examined, chart and data base reviewed.  I agree with the above assessment and plan.  For full details please see Mrs. Algis Downs PA note.  Abdominal pain/diarrhea this is resolved, is negative C. difficile PCR, discharged on Cipro.  Acute renal failure creatinine peak was 2.1, secondary to dehydration this is resolved prior to discharge.   Clint Lipps, MD [facility] Pager: [phone] 01/19/2013, 10:42 AM

## 2013-01-19 NOTE — Progress Notes (Signed)
Patient discharge teaching given, including activity, diet, follow-up appoints, and medications. Patient verbalized understanding of all discharge instructions. IV access was d/c'd. Vitals are stable. Skin is intact except as charted in most recent assessments. Pt to be escorted out by NT, to be driven home by family. 

## 2013-01-25 ENCOUNTER — Ambulatory Visit: Payer: Medicare Other | Attending: Family Medicine | Admitting: Family Medicine

## 2013-01-25 VITALS — BP 128/87 | HR 71 | Temp 98.3°F | Resp 16 | Wt 212.2 lb

## 2013-01-25 DIAGNOSIS — N179 Acute kidney failure, unspecified: Secondary | ICD-10-CM | POA: Insufficient documentation

## 2013-01-25 DIAGNOSIS — E1059 Type 1 diabetes mellitus with other circulatory complications: Secondary | ICD-10-CM

## 2013-01-25 DIAGNOSIS — I10 Essential (primary) hypertension: Secondary | ICD-10-CM

## 2013-01-25 DIAGNOSIS — K219 Gastro-esophageal reflux disease without esophagitis: Secondary | ICD-10-CM

## 2013-01-25 LAB — COMPREHENSIVE METABOLIC PANEL
ALT: 11 U/L (ref 0–53)
AST: 21 U/L (ref 0–37)
Albumin: 3.8 g/dL (ref 3.5–5.2)
Alkaline Phosphatase: 60 U/L (ref 39–117)
Glucose, Bld: 100 mg/dL — ABNORMAL HIGH (ref 70–99)
Potassium: 4.5 mEq/L (ref 3.5–5.3)
Sodium: 137 mEq/L (ref 135–145)
Total Bilirubin: 0.4 mg/dL (ref 0.3–1.2)
Total Protein: 6.9 g/dL (ref 6.0–8.3)

## 2013-01-25 MED ORDER — ESOMEPRAZOLE MAGNESIUM 40 MG PO CPDR: 40.0000 mg | DELAYED_RELEASE_CAPSULE | Freq: Every day | ORAL | Status: DC

## 2013-01-25 NOTE — Patient Instructions (Signed)
Hypertension As your heart beats, it forces blood through your arteries. This force is your blood pressure. If the pressure is too high, it is called hypertension (HTN) or high blood pressure. HTN is dangerous because you may have it and not know it. High blood pressure may mean that your heart has to work harder to pump blood. Your arteries may be narrow or stiff. The extra work puts you at risk for heart disease, stroke, and other problems.  Blood pressure consists of two numbers, a higher number over a lower, 110/72, for example. It is stated as "110 over 72." The ideal is below 120 for the top number (systolic) and under 80 for the bottom (diastolic). Write down your blood pressure today. You should pay close attention to your blood pressure if you have certain conditions such as:  Heart failure.  Prior heart attack.  Diabetes  Chronic kidney disease.  Prior stroke.  Multiple risk factors for heart disease. To see if you have HTN, your blood pressure should be measured while you are seated with your arm held at the level of the heart. It should be measured at least twice. A one-time elevated blood pressure reading (especially in the Emergency Department) does not mean that you need treatment. There may be conditions in which the blood pressure is different between your right and left arms. It is important to see your caregiver soon for a recheck. Most people have essential hypertension which means that there is not a specific cause. This type of high blood pressure may be lowered by changing lifestyle factors such as:  Stress.  Smoking.  Lack of exercise.  Excessive weight.  Drug/tobacco/alcohol use.  Eating less salt. Most people do not have symptoms from high blood pressure until it has caused damage to the body. Effective treatment can often prevent, delay or reduce that damage. TREATMENT  When a cause has been identified, treatment for high blood pressure is directed at the  cause. There are a large number of medications to treat HTN. These fall into several categories, and your caregiver will help you select the medicines that are best for you. Medications may have side effects. You should review side effects with your caregiver. If your blood pressure stays high after you have made lifestyle changes or started on medicines,   Your medication(s) may need to be changed.  Other problems may need to be addressed.  Be certain you understand your prescriptions, and know how and when to take your medicine.  Be sure to follow up with your caregiver within the time frame advised (usually within two weeks) to have your blood pressure rechecked and to review your medications.  If you are taking more than one medicine to lower your blood pressure, make sure you know how and at what times they should be taken. Taking two medicines at the same time can result in blood pressure that is too low. SEEK IMMEDIATE MEDICAL CARE IF:  You develop a severe headache, blurred or changing vision, or confusion.  You have unusual weakness or numbness, or a faint feeling.  You have severe chest or abdominal pain, vomiting, or breathing problems. MAKE SURE YOU:   Understand these instructions.  Will watch your condition.  Will get help right away if you are not doing well or get worse. Document Released: 08/23/2005 Document Revised: 11/15/2011 Document Reviewed: 04/12/2008 [facility] Patient Information 2014 [place], [place].   Diet for Gastroesophageal Reflux Disease, Adult Reflux (acid reflux) is when acid from your  stomach flows up into the esophagus. When acid comes in contact with the esophagus, the acid causes irritation and soreness (inflammation) in the esophagus. When reflux happens often or so severely that it causes damage to the esophagus, it is called gastroesophageal reflux disease (GERD). Nutrition therapy can help ease the discomfort of GERD. FOODS OR DRINKS TO AVOID OR  LIMIT  Smoking or chewing tobacco. Nicotine is one of the most potent stimulants to acid production in the gastrointestinal tract.  Caffeinated and decaffeinated coffee and black tea.  Regular or low-calorie carbonated beverages or energy drinks (caffeine-free carbonated beverages are allowed).   Strong spices, such as black pepper, white pepper, red pepper, cayenne, curry powder, and chili powder.  Peppermint or spearmint.  Chocolate.  High-fat foods, including meats and fried foods. Extra added fats including oils, butter, salad dressings, and nuts. Limit these to less than 8 tsp per day.  Fruits and vegetables if they are not tolerated, such as citrus fruits or tomatoes.  Alcohol.  Any food that seems to aggravate your condition. If you have questions regarding your diet, call your caregiver or a registered dietitian. OTHER THINGS THAT MAY HELP GERD INCLUDE:   Eating your meals slowly, in a relaxed setting.  Eating 5 to 6 small meals per day instead of 3 large meals.  Eliminating food for a period of time if it causes distress.  Not lying down until 3 hours after eating a meal.  Keeping the head of your bed raised 6 to 9 inches (15 to 23 cm) by using a foam wedge or blocks under the legs of the bed. Lying flat may make symptoms worse.  Being physically active. Weight loss may be helpful in reducing reflux in overweight or obese adults.  Wear loose fitting clothing EXAMPLE MEAL PLAN This meal plan is approximately 2,000 calories based on https://www.bernard.org/ meal planning guidelines. Breakfast   cup cooked oatmeal.  1 cup strawberries.  1 cup low-fat milk.  1 oz almonds. Snack  1 cup cucumber slices.  6 oz yogurt (made from low-fat or fat-free milk). Lunch  2 slice whole-wheat bread.  2 oz sliced [place].  2 tsp mayonnaise.  1 cup blueberries.  1 cup snap peas. Snack  6 whole-wheat crackers.  1 oz string cheese. Dinner   cup brown rice.  1  cup mixed veggies.  1 tsp olive oil.  3 oz grilled fish. Document Released: 08/23/2005 Document Revised: 11/15/2011 Document Reviewed: 07/09/2011 [facility] Patient Information 2014 [place], [place].  Gout Gout is an inflammatory condition (arthritis) caused by a buildup of uric acid crystals in the joints. Uric acid is a chemical that is normally present in the blood. Under some circumstances, uric acid can form into crystals in your joints. This causes joint redness, soreness, and swelling (inflammation). Repeat attacks are common. Over time, uric acid crystals can form into masses (tophi) near a joint, causing disfigurement. Gout is treatable and often preventable. CAUSES  The disease begins with elevated levels of uric acid in the blood. Uric acid is produced by your body when it breaks down a naturally found substance called purines. This also happens when you eat certain foods such as meats and fish. Causes of an elevated uric acid level include:  Being passed down from parent to child (heredity).  Diseases that cause increased uric acid production (obesity, psoriasis, some cancers).  Excessive alcohol use.  Diet, especially diets rich in meat and seafood.  Medicines, including certain cancer-fighting drugs (chemotherapy), diuretics, and aspirin.  Chronic kidney disease. The kidneys are no longer able to remove uric acid well.  Problems with metabolism. Conditions strongly associated with gout include:  Obesity.  High blood pressure.  High cholesterol.  Diabetes. Not everyone with elevated uric acid levels gets gout. It is not understood why some people get gout and others do not. Surgery, joint injury, and eating too much of certain foods are some of the factors that can lead to gout. SYMPTOMS   An attack of gout comes on quickly. It causes intense pain with redness, swelling, and warmth in a joint.  Fever can occur.  Often, only one joint is involved. Certain joints are  more commonly involved:  Base of the big toe.  Knee.  Ankle.  Wrist.  Finger. Without treatment, an attack usually goes away in a few days to weeks. Between attacks, you usually will not have symptoms, which is different from many other forms of arthritis. DIAGNOSIS  Your caregiver will suspect gout based on your symptoms and exam. Removal of fluid from the joint (arthrocentesis) is done to check for uric acid crystals. Your caregiver will give you a medicine that numbs the area (local anesthetic) and use a needle to remove joint fluid for exam. Gout is confirmed when uric acid crystals are seen in joint fluid, using a special microscope. Sometimes, blood, urine, and X-ray tests are also used. TREATMENT  There are 2 phases to gout treatment: treating the sudden onset (acute) attack and preventing attacks (prophylaxis). Treatment of an Acute Attack  Medicines are used. These include anti-inflammatory medicines or steroid medicines.  An injection of steroid medicine into the affected joint is sometimes necessary.  The painful joint is rested. Movement can worsen the arthritis.  You may use warm or cold treatments on painful joints, depending which works best for you.  Discuss the use of coffee, vitamin C, or cherries with your caregiver. These may be helpful treatment options. Treatment to Prevent Attacks After the acute attack subsides, your caregiver may advise prophylactic medicine. These medicines either help your kidneys eliminate uric acid from your body or decrease your uric acid production. You may need to stay on these medicines for a very long time. The early phase of treatment with prophylactic medicine can be associated with an increase in acute gout attacks. For this reason, during the first few months of treatment, your caregiver may also advise you to take medicines usually used for acute gout treatment. Be sure you understand your caregiver's directions. You should also  discuss dietary treatment with your caregiver. Certain foods such as meats and fish can increase uric acid levels. Other foods such as dairy can decrease levels. Your caregiver can give you a list of foods to avoid. HOME CARE INSTRUCTIONS   Do not take aspirin to relieve pain. This raises uric acid levels.  Only take over-the-counter or prescription medicines for pain, discomfort, or fever as directed by your caregiver.  Rest the joint as much as possible. When in bed, keep sheets and blankets off painful areas.  Keep the affected joint raised (elevated).  Use crutches if the painful joint is in your leg.  Drink enough water and fluids to keep your urine clear or pale yellow. This helps your body get rid of uric acid. Do not drink alcoholic beverages. They slow the passage of uric acid.  Follow your caregiver's dietary instructions. Pay careful attention to the amount of protein you eat. Your daily diet should emphasize fruits, vegetables, whole grains, and  fat-free or low-fat milk products.  Maintain a healthy body weight. SEEK MEDICAL CARE IF:   You have an oral temperature above 102 F (38.9 C).  You develop diarrhea, vomiting, or any side effects from medicines.  You do not feel better in 24 hours, or you are getting worse. SEEK IMMEDIATE MEDICAL CARE IF:   Your joint becomes suddenly more tender and you have:  Chills.  An oral temperature above 102 F (38.9 C), not controlled by medicine. MAKE SURE YOU:   Understand these instructions.  Will watch your condition.  Will get help right away if you are not doing well or get worse. Document Released: 08/20/2000 Document Revised: 11/15/2011 Document Reviewed: 12/01/2009 [facility] Patient Information 2014 [place], [place].

## 2013-01-25 NOTE — Progress Notes (Signed)
Patient ID: [name], male   DOB: 1961-07-10, 52 y.o.   MRN: [id number]  CC: Followup from hospital  HPI: Patient is presenting today for hospital followup.  He was admitted for acute renal failure and GI disease.  He likely had pancreatitis.  He reports that he is doing much better except that he is having severe acid reflux symptoms.  This has occurred since he stopped taking the Nexium.  He says he needs a prescription for Nexium.  The patient reports that he otherwise has been doing very well and has not consumed any more alcohol.  Allergies  Allergen Reactions  . Hydrocodone-Acetaminophen Itching   Past Medical History  Diagnosis Date  . Hypertension   . Hypercholesteremia   . Baker's cyst of knee   . Type II diabetes mellitus   . GERD (gastroesophageal reflux disease)   . Gouty arthritis     "real bad" (01/17/2013)   Current Outpatient Prescriptions on File Prior to Visit  Medication Sig Dispense Refill  . allopurinol (ZYLOPRIM) 300 MG tablet Take 1 tablet (300 mg total) by mouth daily.  30 tablet  1  . atenolol (TENORMIN) 100 MG tablet Take 1 tablet (100 mg total) by mouth 2 (two) times daily. Take two tablets twice daily  60 tablet  3  . ciprofloxacin (CIPRO) 750 MG tablet Take 1 tablet (750 mg total) by mouth 2 (two) times daily.  13 tablet  0  . colchicine 0.6 MG tablet Take 0.6 mg by mouth 2 (two) times daily as needed. For gout      . esomeprazole (NEXIUM) 40 MG capsule Take 1 capsule (40 mg total) by mouth daily before breakfast.  30 capsule  3  . hydrochlorothiazide (HYDRODIURIL) 25 MG tablet Take 1 tablet (25 mg total) by mouth daily.  30 tablet  3  . lisinopril (PRINIVIL,ZESTRIL) 40 MG tablet Take 1 tablet (40 mg total) by mouth daily.  30 tablet  3  . loratadine (CLARITIN) 10 MG tablet Take 1 tablet (10 mg total) by mouth daily.  30 tablet  3  . metFORMIN (GLUCOPHAGE) 500 MG tablet Take 1 tablet (500 mg total) by mouth 2 (two) times daily with a meal.  60 tablet  3   . traMADol (ULTRAM) 50 MG tablet Take 1 tablet (50 mg total) by mouth every 6 (six) hours as needed for pain.  30 tablet  1   No current facility-administered medications on file prior to visit.   Family History  Problem Relation Age of Onset  . Diabetes Father   . Hypertension Mother   . Hypertension Father   . Hypertension Sister   . Hypertension Brother   . Hypertension Brother   . Hypertension Brother    History   Social History  . Marital Status: Widowed    Spouse Name: N/A    Number of Children: N/A  . Years of Education: N/A   Occupational History  . Not on file.   Social History Main Topics  . Smoking status: Never Smoker   . Smokeless tobacco: Never Used  . Alcohol Use: 1.8 oz/week    3 Shots of liquor per week     Comment: 01/17/2013 "2-3 shots of liquor maybe once/wk; I've quit"  . Drug Use: Yes    Special: Marijuana     Comment: 01/17/2013 "last weed was ~ a month ago"  . Sexually Active: Yes   Other Topics Concern  . Not on file   Social History Narrative  .  No narrative on file    Review of Systems  Constitutional: Negative for fever, chills, diaphoresis, activity change, appetite change and fatigue.  HENT: Negative for ear pain, nosebleeds, congestion, facial swelling, rhinorrhea, neck pain, neck stiffness and ear discharge.   Eyes: Negative for pain, discharge, redness, itching and visual disturbance.  Respiratory: Negative for cough, choking, chest tightness, shortness of breath, wheezing and stridor.   Cardiovascular: Negative for chest pain, palpitations and leg swelling.  Gastrointestinal: Severe acid reflux.  Genitourinary: Negative for dysuria, urgency, frequency, hematuria, flank pain, decreased urine volume, difficulty urinating and dyspareunia.  Musculoskeletal: Negative for back pain, joint swelling, arthralgias and gait problem.  Neurological: Negative for dizziness, tremors, seizures, syncope, facial asymmetry, speech difficulty,  weakness, light-headedness, numbness and headaches.  Hematological: Negative for adenopathy. Does not bruise/bleed easily.  Psychiatric/Behavioral: Negative for hallucinations, behavioral problems, confusion, dysphoric mood, decreased concentration and agitation.    Objective:   Filed Vitals:   01/25/13 1531  BP: 128/87  Pulse: 71  Temp: 98.3 F (36.8 C)  Resp: 16    Physical Exam  Constitutional: Appears well-developed and well-nourished. No distress.  HENT: Normocephalic. External right and left ear normal. Oropharynx is clear and moist.  Eyes: Conjunctivae and EOM are normal. PERRLA, no scleral icterus.  Neck: Normal ROM. Neck supple. No JVD. No tracheal deviation. No thyromegaly.  CVS: RRR, S1/S2 +, no murmurs, no gallops, no carotid bruit.  Pulmonary: Effort and breath sounds normal, no stridor, rhonchi, wheezes, rales.  Abdominal: Soft. BS +,  no distension, tenderness, rebound or guarding.  Musculoskeletal:  No edema and no tenderness.  Lymphadenopathy: No lymphadenopathy noted, cervical, inguinal. Neuro: Alert. Normal reflexes, muscle tone coordination. No cranial nerve deficit. Skin: Skin is warm and dry. No rash noted. Not diaphoretic. No erythema. No pallor.  Psychiatric: Normal mood and affect. Behavior, judgment, thought content normal.   Lab Results  Component Value Date   WBC 17.1* 01/18/2013   HGB 15.5 01/18/2013   HCT 42.3 01/18/2013   MCV 85.8 01/18/2013   PLT 251 01/18/2013   Lab Results  Component Value Date   CREATININE 1.17 01/19/2013   BUN 19 01/19/2013   NA 133* 01/19/2013   K 4.0 01/19/2013   CL 102 01/19/2013   CO2 17* 01/19/2013    Lab Results  Component Value Date   HGBA1C 5.6 01/17/2013   Lipid Panel     Component Value Date/Time   CHOL 180 04/09/2010 2132   TRIG 161* 04/09/2010 2132   HDL 39* 04/09/2010 2132   CHOLHDL 4.6 Ratio 04/09/2010 2132   VLDL 32 04/09/2010 2132   LDLCALC 109* 04/09/2010 2132       Assessment and plan:   Patient Active  Problem List   Diagnosis Date Noted  . Acute kidney injury 01/17/2013  . Gallstones 01/16/2013  . Diarrhea 01/16/2013  . ALLERGIC RHINITIS 07/09/2010  . GOUT 10/14/2009  . SEBACEOUS CYST 10/01/2009  . FLANK PAIN, LEFT 12/10/2008  . KNEE PAIN, RIGHT 11/28/2008  . HYPERURICEMIA 07/09/2008  . OBESITY 07/08/2008  . LEG PAIN, LEFT 07/08/2008  . CONSTIPATION 08/01/2007  . ERECTILE DYSFUNCTION 06/29/2007  . SHOULDER PAIN, BILATERAL 05/30/2007  . HYPERCHOLESTEROLEMIA 05/16/2007  . VISUAL ACUITY, DECREASED 05/16/2007  . BURSITIS, RIGHT SHOULDER 05/16/2007  . HYPERTENSION 05/15/2007  . GERD 05/15/2007  . ARTHROSCOPY, KNEE, HX OF 05/15/2007   Check labs today to followup from recent hospitalization Refer to GI for colonoscopy Restarted Nexium for severe acid reflux disease  Follow up in 3  months  The patient was given clear instructions to go to ER or return to medical center if symptoms don't improve, worsen or new problems develop.  The patient verbalized understanding.  The patient was told to call to get lab results if they haven't heard anything in the next week.     Rodney Langton, MD, CDE, FAAFP [facility] [place], [place]

## 2013-01-25 NOTE — Progress Notes (Signed)
Patient here for hospital follow up Was in [facility] for gall stones dehydration

## 2013-01-26 NOTE — Progress Notes (Signed)
Quick Note:  Please inform patient that his metabolic panel came back okay.  Rodney Langton, MD, CDE, FAAFP [facility] [place], [place]   ______

## 2013-01-30 ENCOUNTER — Telehealth: Payer: Self-pay | Admitting: *Deleted

## 2013-01-30 NOTE — Telephone Encounter (Signed)
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 Laural Benes. P.Praveen Coia,RN

## 2013-04-24 ENCOUNTER — Telehealth: Payer: Self-pay | Admitting: Emergency Medicine

## 2013-04-24 ENCOUNTER — Encounter: Payer: Self-pay | Admitting: Gastroenterology

## 2013-04-24 NOTE — Telephone Encounter (Signed)
Pt requesting medication refill Allopurinol 300mg  tab. Pt needs to schedule appt for uric acid level- Nonnie Done RN

## 2013-04-27 ENCOUNTER — Ambulatory Visit: Payer: Medicare Other

## 2013-05-02 ENCOUNTER — Ambulatory Visit: Payer: Medicare Other | Attending: Internal Medicine | Admitting: Internal Medicine

## 2013-05-02 ENCOUNTER — Encounter: Payer: Self-pay | Admitting: Internal Medicine

## 2013-05-02 VITALS — BP 137/95 | HR 73 | Temp 98.4°F | Resp 16 | Ht 63.0 in | Wt 207.0 lb

## 2013-05-02 DIAGNOSIS — E119 Type 2 diabetes mellitus without complications: Secondary | ICD-10-CM

## 2013-05-02 DIAGNOSIS — L02229 Furuncle of trunk, unspecified: Secondary | ICD-10-CM

## 2013-05-02 DIAGNOSIS — I10 Essential (primary) hypertension: Secondary | ICD-10-CM

## 2013-05-02 DIAGNOSIS — K219 Gastro-esophageal reflux disease without esophagitis: Secondary | ICD-10-CM

## 2013-05-02 DIAGNOSIS — M1000 Idiopathic gout, unspecified site: Secondary | ICD-10-CM | POA: Insufficient documentation

## 2013-05-02 DIAGNOSIS — M109 Gout, unspecified: Secondary | ICD-10-CM

## 2013-05-02 DIAGNOSIS — E1059 Type 1 diabetes mellitus with other circulatory complications: Secondary | ICD-10-CM

## 2013-05-02 LAB — POCT GLYCOSYLATED HEMOGLOBIN (HGB A1C): Hemoglobin A1C: 5.4

## 2013-05-02 MED ORDER — LISINOPRIL 40 MG PO TABS: 40.0000 mg | ORAL_TABLET | Freq: Every day | ORAL | Status: DC

## 2013-05-02 MED ORDER — ATENOLOL 100 MG PO TABS: 100.0000 mg | ORAL_TABLET | Freq: Two times a day (BID) | ORAL | Status: DC

## 2013-05-02 MED ORDER — COLCHICINE 0.6 MG PO TABS: 0.6000 mg | ORAL_TABLET | Freq: Two times a day (BID) | ORAL | Status: DC | PRN

## 2013-05-02 MED ORDER — HYDROCHLOROTHIAZIDE 25 MG PO TABS: 25.0000 mg | ORAL_TABLET | Freq: Every day | ORAL | Status: DC

## 2013-05-02 MED ORDER — ESOMEPRAZOLE MAGNESIUM 40 MG PO CPDR: 40.0000 mg | DELAYED_RELEASE_CAPSULE | Freq: Every day | ORAL | Status: DC

## 2013-05-02 MED ORDER — METFORMIN HCL 500 MG PO TABS: 500.0000 mg | ORAL_TABLET | Freq: Two times a day (BID) | ORAL | Status: DC

## 2013-05-02 MED ORDER — TRAMADOL HCL 50 MG PO TABS: 50.0000 mg | ORAL_TABLET | Freq: Four times a day (QID) | ORAL | Status: DC | PRN

## 2013-05-02 MED ORDER — ALLOPURINOL 300 MG PO TABS: 300.0000 mg | ORAL_TABLET | Freq: Every day | ORAL | Status: DC

## 2013-05-02 MED ORDER — SULFAMETHOXAZOLE-TMP DS 800-160 MG PO TABS: 1.0000 | ORAL_TABLET | Freq: Two times a day (BID) | ORAL | Status: DC

## 2013-05-02 NOTE — Progress Notes (Signed)
PT HERE WITH C/O MIDDLE LOWER BACK ABSCESS X 1 WEEK. TENDER TO TOUCH. NO DRAINAGE OR FEVERS
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
## 2013-05-08 ENCOUNTER — Ambulatory Visit: Payer: Medicare Other | Attending: Internal Medicine | Admitting: Internal Medicine

## 2013-05-08 VITALS — BP 191/133 | HR 95 | Temp 98.1°F | Resp 16 | Ht 66.54 in | Wt 210.0 lb

## 2013-05-08 DIAGNOSIS — L02229 Furuncle of trunk, unspecified: Secondary | ICD-10-CM

## 2013-05-08 DIAGNOSIS — L02219 Cutaneous abscess of trunk, unspecified: Secondary | ICD-10-CM | POA: Insufficient documentation

## 2013-05-08 NOTE — Progress Notes (Signed)
Pt is here for a follow up visit. Pt states that he is only here for the doctor to check his cyst on his back.

## 2013-05-08 NOTE — Progress Notes (Signed)
Patient ID: [name], male   DOB: Aug 07, 1961, 52 y.o.   MRN: [id number]   CC: Followup  HPI: Patient was seen in the clinic today for review of the abscess in his back. He was seen 5 days ago for the same condition. He's been on antibiotics since then and was given instruction to combat today for followup.The abscess has spontaneously drained as per patient Pain is reduced  Allergies  Allergen Reactions  . Hydrocodone-Acetaminophen Itching   Past Medical History  Diagnosis Date  . Hypertension   . Hypercholesteremia   . Baker's cyst of knee   . Type II diabetes mellitus   . GERD (gastroesophageal reflux disease)   . Gouty arthritis     "real bad" (01/17/2013)   Current Outpatient Prescriptions on File Prior to Visit  Medication Sig Dispense Refill  . allopurinol (ZYLOPRIM) 300 MG tablet Take 1 tablet (300 mg total) by mouth daily.  30 tablet  3  . atenolol (TENORMIN) 100 MG tablet Take 1 tablet (100 mg total) by mouth 2 (two) times daily. Take two tablets twice daily  60 tablet  3  . ciprofloxacin (CIPRO) 750 MG tablet Take 1 tablet (750 mg total) by mouth 2 (two) times daily.  13 tablet  0  . colchicine 0.6 MG tablet Take 1 tablet (0.6 mg total) by mouth 2 (two) times daily as needed. For gout  20 tablet  0  . esomeprazole (NEXIUM) 40 MG capsule Take 1 capsule (40 mg total) by mouth daily before breakfast.  30 capsule  3  . hydrochlorothiazide (HYDRODIURIL) 25 MG tablet Take 1 tablet (25 mg total) by mouth daily.  30 tablet  3  . lisinopril (PRINIVIL,ZESTRIL) 40 MG tablet Take 1 tablet (40 mg total) by mouth daily.  30 tablet  3  . loratadine (CLARITIN) 10 MG tablet Take 1 tablet (10 mg total) by mouth daily.  30 tablet  3  . metFORMIN (GLUCOPHAGE) 500 MG tablet Take 1 tablet (500 mg total) by mouth 2 (two) times daily with a meal.  60 tablet  3  . sulfamethoxazole-trimethoprim (BACTRIM DS) 800-160 MG per tablet Take 1 tablet by mouth 2 (two) times daily.  14 tablet  0  .  traMADol (ULTRAM) 50 MG tablet Take 1 tablet (50 mg total) by mouth every 6 (six) hours as needed for pain.  30 tablet  1   No current facility-administered medications on file prior to visit.   Family History  Problem Relation Age of Onset  . Diabetes Father   . Hypertension Mother   . Hypertension Father   . Hypertension Sister   . Hypertension Brother   . Hypertension Brother   . Hypertension Brother    History   Social History  . Marital Status: Widowed    Spouse Name: N/A    Number of Children: N/A  . Years of Education: N/A   Occupational History  . Not on file.   Social History Main Topics  . Smoking status: Never Smoker   . Smokeless tobacco: Never Used  . Alcohol Use: 1.8 oz/week    3 Shots of liquor per week     Comment: 01/17/2013 "2-3 shots of liquor maybe once/wk; I've quit"  . Drug Use: Yes    Special: Marijuana     Comment: 01/17/2013 "last weed was ~ a month ago"  . Sexual Activity: Yes   Other Topics Concern  . Not on file   Social History Narrative  . No narrative  on file    Review of Systems: Constitutional: Negative for fever, chills, diaphoresis, activity change, appetite change and fatigue. HENT: Negative for ear pain, nosebleeds, congestion, facial swelling, rhinorrhea, neck pain, neck stiffness and ear discharge.  Eyes: Negative for pain, discharge, redness, itching and visual disturbance. Respiratory: Negative for cough, choking, chest tightness, shortness of breath, wheezing and stridor.  Cardiovascular: Negative for chest pain, palpitations and leg swelling. Gastrointestinal: Negative for abdominal distention. Genitourinary: Negative for dysuria, urgency, frequency, hematuria, flank pain, decreased urine volume, difficulty urinating and dyspareunia.  Musculoskeletal:++back pain, no joint swelling, arthralgias and gait problem. Neurological: Negative for dizziness, tremors, seizures, syncope, facial asymmetry, speech difficulty, weakness,  light-headedness, numbness and headaches.  Hematological: Negative for adenopathy. Does not bruise/bleed easily. Psychiatric/Behavioral: Negative for hallucinations, behavioral problems, confusion, dysphoric mood, decreased concentration and agitation.    Objective:   Filed Vitals:   05/08/13 1819  BP: 191/133  Pulse: 95  Temp: 98.1 F (36.7 C)  Resp: 16    Physical Exam: Constitutional: Patient appears well-developed and well-nourished. No distress. HENT: Normocephalic, atraumatic, External right and left ear normal. Oropharynx is clear and moist.  Eyes: Conjunctivae and EOM are normal. PERRLA, no scleral icterus. Neck: Normal ROM. Neck supple. No JVD. No tracheal deviation. No thyromegaly. CVS: RRR, S1/S2 +, no murmurs, no gallops, no carotid bruit.  Pulmonary: Effort and breath sounds normal, no stridor, rhonchi, wheezes, rales.  Abdominal: Soft. BS +,  no distension, tenderness, rebound or guarding.  Musculoskeletal: Normal range of motion. No edema and no tenderness.  Lymphadenopathy: No lymphadenopathy noted, cervical, inguinal or axillary Neuro: Alert. Normal reflexes, muscle tone coordination. No cranial nerve deficit. Skin: Draining abscess on the midline thoracic area of the back, minimal induration.  Psychiatric: Normal mood and affect. Behavior, judgment, thought content normal.  Lab Results  Component Value Date   WBC 17.1* 01/18/2013   HGB 15.5 01/18/2013   HCT 42.3 01/18/2013   MCV 85.8 01/18/2013   PLT 251 01/18/2013   Lab Results  Component Value Date   CREATININE 0.98 01/25/2013   BUN 14 01/25/2013   NA 137 01/25/2013   K 4.5 01/25/2013   CL 109 01/25/2013   CO2 21 01/25/2013    Lab Results  Component Value Date   HGBA1C 5.4 05/02/2013   Lipid Panel     Component Value Date/Time   CHOL 180 04/09/2010 2132   TRIG 161* 04/09/2010 2132   HDL 39* 04/09/2010 2132   CHOLHDL 4.6 Ratio 04/09/2010 2132   VLDL 32 04/09/2010 2132   LDLCALC 109* 04/09/2010 2132        Assessment and plan:   Patient Active Problem List   Diagnosis Date Noted  . Diabetes 05/02/2013  . Carbuncle and furuncle of trunk 05/02/2013  . Type I (juvenile type) diabetes mellitus with peripheral circulatory disorders, not stated as uncontrolled(250.71) 05/02/2013  . GERD (gastroesophageal reflux disease) 05/02/2013  . Gout 05/02/2013  . Acute kidney injury 01/17/2013  . Gallstones 01/16/2013  . Diarrhea 01/16/2013  . ALLERGIC RHINITIS 07/09/2010  . GOUT 10/14/2009  . SEBACEOUS CYST 10/01/2009  . FLANK PAIN, LEFT 12/10/2008  . KNEE PAIN, RIGHT 11/28/2008  . HYPERURICEMIA 07/09/2008  . OBESITY 07/08/2008  . LEG PAIN, LEFT 07/08/2008  . CONSTIPATION 08/01/2007  . ERECTILE DYSFUNCTION 06/29/2007  . SHOULDER PAIN, BILATERAL 05/30/2007  . HYPERCHOLESTEROLEMIA 05/16/2007  . VISUAL ACUITY, DECREASED 05/16/2007  . BURSITIS, RIGHT SHOULDER 05/16/2007  . HYPERTENSION 05/15/2007  . GERD 05/15/2007  . ARTHROSCOPY, KNEE, HX OF  05/15/2007   Continue antibiotics for 4 more days Pain control Patient counseled Continue other medications as prescribed  Davanta Meuser was given clear instructions to go to ER or return to the clinic if symptoms don't improve, worsen or new problems develop.  [name] verbalized understanding.  Donel Osowski was told to call to get lab results if hasn't heard anything in the next week.        Jeanann Lewandowsky, MD [facility] [place], [place] [phone]   05/08/2013, 7:44 PM

## 2013-11-17 ENCOUNTER — Emergency Department (HOSPITAL_COMMUNITY)
Admission: EM | Admit: 2013-11-17 | Discharge: 2013-11-17 | Disposition: A | Discharge status: Home / Self Care | Payer: Medicare Other | Attending: Emergency Medicine | Admitting: Emergency Medicine

## 2013-11-17 ENCOUNTER — Encounter (HOSPITAL_COMMUNITY): Payer: Self-pay | Admitting: Emergency Medicine

## 2013-11-17 DIAGNOSIS — I10 Essential (primary) hypertension: Secondary | ICD-10-CM | POA: Insufficient documentation

## 2013-11-17 DIAGNOSIS — Z79899 Other long term (current) drug therapy: Secondary | ICD-10-CM | POA: Insufficient documentation

## 2013-11-17 DIAGNOSIS — M109 Gout, unspecified: Secondary | ICD-10-CM | POA: Insufficient documentation

## 2013-11-17 DIAGNOSIS — R112 Nausea with vomiting, unspecified: Secondary | ICD-10-CM | POA: Insufficient documentation

## 2013-11-17 DIAGNOSIS — T7840XA Allergy, unspecified, initial encounter: Secondary | ICD-10-CM

## 2013-11-17 DIAGNOSIS — E119 Type 2 diabetes mellitus without complications: Secondary | ICD-10-CM | POA: Insufficient documentation

## 2013-11-17 DIAGNOSIS — R61 Generalized hyperhidrosis: Secondary | ICD-10-CM | POA: Insufficient documentation

## 2013-11-17 DIAGNOSIS — Z8739 Personal history of other diseases of the musculoskeletal system and connective tissue: Secondary | ICD-10-CM | POA: Insufficient documentation

## 2013-11-17 DIAGNOSIS — F411 Generalized anxiety disorder: Secondary | ICD-10-CM | POA: Insufficient documentation

## 2013-11-17 DIAGNOSIS — K219 Gastro-esophageal reflux disease without esophagitis: Secondary | ICD-10-CM | POA: Insufficient documentation

## 2013-11-17 LAB — CBG MONITORING, ED: GLUCOSE-CAPILLARY: 127 mg/dL — AB (ref 70–99)

## 2013-11-17 MED ORDER — LORAZEPAM 2 MG/ML IJ SOLN
1.0000 mg | Freq: Once | INTRAMUSCULAR | Status: AC
  Administered 2013-11-17: 1 mg via INTRAVENOUS
  Filled 2013-11-17: qty 1

## 2013-11-17 MED ORDER — EPINEPHRINE 0.3 MG/0.3ML IJ SOAJ: 0.3000 mg | Freq: Once | INTRAMUSCULAR | Status: DC

## 2013-11-17 MED ORDER — ATENOLOL 100 MG PO TABS
100.0000 mg | ORAL_TABLET | Freq: Once | ORAL | Status: AC
  Administered 2013-11-17: 100 mg via ORAL
  Filled 2013-11-17: qty 1

## 2013-11-17 MED ORDER — HYDROCHLOROTHIAZIDE 12.5 MG PO CAPS
25.0000 mg | ORAL_CAPSULE | Freq: Once | ORAL | Status: AC
  Administered 2013-11-17: 25 mg via ORAL
  Filled 2013-11-17: qty 2

## 2013-11-17 MED ORDER — LISINOPRIL 40 MG PO TABS
40.0000 mg | ORAL_TABLET | Freq: Once | ORAL | Status: AC
  Administered 2013-11-17: 40 mg via ORAL
  Filled 2013-11-17: qty 1

## 2013-11-17 NOTE — ED Notes (Signed)
Pt extremely anxious on arrival.  Tremors.

## 2013-11-17 NOTE — Discharge Instructions (Signed)
As discussed, it is important that you follow up with your physician for continued management of your condition. ° °If you develop any new, or concerning changes in your condition, please return to the emergency department immediately. °

## 2013-11-17 NOTE — ED Provider Notes (Signed)
CSN: [id number]     Arrival date & time 11/17/13  1434 History   First MD Initiated Contact with Patient 11/17/13 1500     Chief Complaint  Patient presents with  . Allergic Reaction     HPI  Patient presents after an episode of acute onset anxiety, diaphoresis, nausea, headache. The patient was eating mushrooms, and felt acutely ill. Prior to this the patient was in his usual state of health. Symptoms lasted less than one hour improved with rest, fluids. Patient had one episode of emesis, no incontinence, no pain. There was LH, but no syncope. No known allergies.   Past Medical History  Diagnosis Date  . Hypertension   . Hypercholesteremia   . Baker's cyst of knee   . Type II diabetes mellitus   . GERD (gastroesophageal reflux disease)   . Gouty arthritis     "real bad" (01/17/2013)   Past Surgical History  Procedure Laterality Date  . Finger amputation Right 1980's    3rd & 4th digits "got them mashed" (01/17/2013)  . Knee arthroscopy Right 1980's   Family History  Problem Relation Age of Onset  . Diabetes Father   . Hypertension Mother   . Hypertension Father   . Hypertension Sister   . Hypertension Brother   . Hypertension Brother   . Hypertension Brother    History  Substance Use Topics  . Smoking status: Never Smoker   . Smokeless tobacco: Never Used  . Alcohol Use: 1.8 oz/week    3 Shots of liquor per week     Comment: 01/17/2013 "2-3 shots of liquor maybe once/wk; I've quit"    Review of Systems  Constitutional: Positive for diaphoresis. Negative for fever and chills.  HENT: Negative for facial swelling.   Eyes: Negative for visual disturbance.  Respiratory: Negative for cough and shortness of breath.   Cardiovascular: Negative for chest pain.  Gastrointestinal: Positive for nausea and vomiting. Negative for anal bleeding.  Skin: Negative for rash.  Allergic/Immunologic: Negative for food allergies.  Neurological: Positive for headaches. Negative  for speech difficulty and weakness.      Allergies  Other and Hydrocodone-acetaminophen  Home Medications   Current Outpatient Rx  Name  Route  Sig  Dispense  Refill  . atenolol (TENORMIN) 100 MG tablet   Oral   Take 1 tablet (100 mg total) by mouth 2 (two) times daily. Take two tablets twice daily   60 tablet   3   . esomeprazole (NEXIUM) 40 MG capsule   Oral   Take 1 capsule (40 mg total) by mouth daily before breakfast.   30 capsule   3   . hydrochlorothiazide (HYDRODIURIL) 25 MG tablet   Oral   Take 1 tablet (25 mg total) by mouth daily.   30 tablet   3   . lisinopril (PRINIVIL,ZESTRIL) 40 MG tablet   Oral   Take 1 tablet (40 mg total) by mouth daily.   30 tablet   3   . loratadine (CLARITIN) 10 MG tablet   Oral   Take 1 tablet (10 mg total) by mouth daily.   30 tablet   3   . metFORMIN (GLUCOPHAGE) 500 MG tablet   Oral   Take 1 tablet (500 mg total) by mouth 2 (two) times daily with a meal.   60 tablet   3   . allopurinol (ZYLOPRIM) 300 MG tablet   Oral   Take 1 tablet (300 mg total) by mouth daily.  30 tablet   3   . colchicine 0.6 MG tablet   Oral   Take 1 tablet (0.6 mg total) by mouth 2 (two) times daily as needed. For gout   20 tablet   0    BP 219/130  Pulse 107  Temp(Src) 98.3 F (36.8 C) (Oral)  Resp 22  SpO2 97% Physical Exam  Nursing note and vitals reviewed. Constitutional: He is oriented to person, place, and time. He appears well-developed and well-nourished.  Patient has already improved prior to my eval - he remains mildly diaphoretic  HENT:  Head: Normocephalic and atraumatic.  Eyes: Conjunctivae and EOM are normal.  Cardiovascular: Normal rate and regular rhythm.   Pulmonary/Chest: Effort normal. No stridor. No respiratory distress.  Abdominal: He exhibits no distension.  Musculoskeletal: He exhibits no edema.  Neurological: He is alert and oriented to person, place, and time.  Skin: Skin is warm. He is diaphoretic.   Psychiatric: He has a normal mood and affect.    ED Course  Procedures (including critical care time) Labs Review Labs Reviewed  CBG MONITORING, ED - Abnormal; Notable for the following:    Glucose-Capillary 127 (*)    All other components within normal limits   Chart reviewed, including CT 01/2013 - w no e/o aneurism  O2- 99%ra, nml  Cardiac: 95-100 sr, nml  Patient is hypertensive on arrival - he did not take meds today.   4:54 PM Patient appears better. BP now decreased.  5:22 PM Patient appears calm, no new complaints. I discussed all findings with him and his wife. MDM  Patient presents after a possible allergic reaction, with nausea, vomiting, headache, lightheadedness. Patient improved substantially here, has no evidence of respiratory compromise, or any decompensation.  With this improvement, both subjectively and numerically, including normalization of his blood pressure, he was stable for discharge with further evaluation, management as an outpatient.    Gerhard Munchobert Anniebell Bedore, MD 11/17/13 1723

## 2013-11-17 NOTE — ED Provider Notes (Signed)
MSE was initiated and I personally evaluated the patient and placed orders (if any) at  2:39 PM on November 17, 2013.  The patient appears stable so that the remainder of the MSE may be completed by another provider.  Pt was eating mushrooms at [employer], got flushed, light headed and has a HA.  Pt is anxious, tachypneic and seems to be hyperventilating and states "I don't want to have an aneurysm."  Pt is tachycardic (120's) and hypertensive (188/120) as well.  No rash or oral swelling.  No stridor.  Will give some ativan for anxiety.  Pt has not taken his HTN meds since Wednesday.  Gavin Pound. Mariane Burpee, MD 11/17/13 1441

## 2013-11-17 NOTE — ED Notes (Signed)
Pt states he was at [employer] and started feeling hot and became very anxious.  Statws no shortness of breath

## 2014-03-26 ENCOUNTER — Emergency Department (INDEPENDENT_AMBULATORY_CARE_PROVIDER_SITE_OTHER)
Admission: EM | Admit: 2014-03-26 | Discharge: 2014-03-26 | Disposition: A | Discharge status: Home / Self Care | Payer: Medicare Other | Source: Home / Self Care | Attending: Family Medicine | Admitting: Family Medicine

## 2014-03-26 ENCOUNTER — Encounter (HOSPITAL_COMMUNITY): Payer: Self-pay | Admitting: Emergency Medicine

## 2014-03-26 DIAGNOSIS — M109 Gout, unspecified: Secondary | ICD-10-CM

## 2014-03-26 DIAGNOSIS — K219 Gastro-esophageal reflux disease without esophagitis: Secondary | ICD-10-CM

## 2014-03-26 DIAGNOSIS — E119 Type 2 diabetes mellitus without complications: Secondary | ICD-10-CM

## 2014-03-26 DIAGNOSIS — I10 Essential (primary) hypertension: Secondary | ICD-10-CM

## 2014-03-26 LAB — POCT I-STAT, CHEM 8
BUN: 16 mg/dL (ref 6–23)
CALCIUM ION: 1.21 mmol/L (ref 1.12–1.23)
CHLORIDE: 106 meq/L (ref 96–112)
CREATININE: 0.9 mg/dL (ref 0.50–1.35)
Glucose, Bld: 144 mg/dL — ABNORMAL HIGH (ref 70–99)
HEMATOCRIT: 50 % (ref 39.0–52.0)
Hemoglobin: 17 g/dL (ref 13.0–17.0)
Potassium: 4 mEq/L (ref 3.7–5.3)
Sodium: 137 mEq/L (ref 137–147)
TCO2: 20 mmol/L (ref 0–100)

## 2014-03-26 MED ORDER — MELOXICAM 15 MG PO TABS: 15.0000 mg | ORAL_TABLET | Freq: Every day | ORAL | Status: DC

## 2014-03-26 MED ORDER — ATENOLOL 100 MG PO TABS
100.0000 mg | ORAL_TABLET | Freq: Every day | ORAL | Status: DC
Start: 2014-03-26 — End: 2014-06-25

## 2014-03-26 MED ORDER — METFORMIN HCL 500 MG PO TABS: 500.0000 mg | ORAL_TABLET | Freq: Two times a day (BID) | ORAL | Status: DC

## 2014-03-26 MED ORDER — ALLOPURINOL 300 MG PO TABS: 300.0000 mg | ORAL_TABLET | Freq: Every day | ORAL | Status: DC

## 2014-03-26 MED ORDER — COLCHICINE 0.6 MG PO TABS: 0.3000 mg | ORAL_TABLET | Freq: Two times a day (BID) | ORAL | Status: DC | PRN

## 2014-03-26 MED ORDER — ESOMEPRAZOLE MAGNESIUM 40 MG PO CPDR: 40.0000 mg | DELAYED_RELEASE_CAPSULE | Freq: Every day | ORAL | Status: DC

## 2014-03-26 MED ORDER — HYDROCHLOROTHIAZIDE 25 MG PO TABS: 25.0000 mg | ORAL_TABLET | Freq: Every day | ORAL | Status: DC

## 2014-03-26 MED ORDER — PRAVASTATIN SODIUM 20 MG PO TABS: 20.0000 mg | ORAL_TABLET | Freq: Every day | ORAL | Status: DC

## 2014-03-26 MED ORDER — LISINOPRIL 40 MG PO TABS: 40.0000 mg | ORAL_TABLET | Freq: Every day | ORAL | Status: DC

## 2014-03-26 NOTE — ED Provider Notes (Signed)
CSN: [id number]     Arrival date & time 03/26/14  [id number] History   First MD Initiated Contact with Patient 03/26/14 [phone]     Chief Complaint  Patient presents with  . Medication Refill   (Consider location/radiation/quality/duration/timing/severity/associated sxs/prior Treatment) HPI Comments: 53 year old male presents requesting medication refills. He states he has been out of everything for a couple months. He was [facility] previously [facility] over a year ago, he says he has tried to make appointments that they can never see him until 2 months for when he tries to make an appointment. His only current complaint is some mild right knee pain where he had a cyst removed. No leg swelling, no shortness of breath or chest pain. His last gout flare was about 3 weeks ago. He doesn't know what medications he he was on but he says he takes something for blood pressure, something for cholesterol, something for gout, something for heartburn, and something for diabetes.   Past Medical History  Diagnosis Date  . Hypertension   . Hypercholesteremia   . Christyanna Mckeon's cyst of knee   . Type II diabetes mellitus   . GERD (gastroesophageal reflux disease)   . Gouty arthritis     "real bad" (01/17/2013)   Past Surgical History  Procedure Laterality Date  . Finger amputation Right 1980's    3rd & 4th digits "got them mashed" (01/17/2013)  . Knee arthroscopy Right 1980's   Family History  Problem Relation Age of Onset  . Diabetes Father   . Hypertension Mother   . Hypertension Father   . Hypertension Sister   . Hypertension Brother   . Hypertension Brother   . Hypertension Brother    History  Substance Use Topics  . Smoking status: Never Smoker   . Smokeless tobacco: Never Used  . Alcohol Use: 1.8 oz/week    3 Shots of liquor per week     Comment: 01/17/2013 "2-3 shots of liquor maybe once/wk; I've quit"    Review of Systems  Constitutional: Negative for fever, chills and fatigue.   HENT: Negative for sore throat.   Eyes: Negative for visual disturbance.  Respiratory: Negative for cough and shortness of breath.   Cardiovascular: Negative for chest pain, palpitations and leg swelling.  Gastrointestinal: Negative for nausea, vomiting, abdominal pain, diarrhea and constipation.  Genitourinary: Negative for dysuria, urgency, frequency and hematuria.  Musculoskeletal: Positive for arthralgias (mild chronic right knee pain ). Negative for myalgias, neck pain and neck stiffness.  Skin: Negative for rash.  Neurological: Negative for dizziness, weakness and light-headedness.  All other systems reviewed and are negative.   Allergies  Other and Hydrocodone-acetaminophen  Home Medications   Prior to Admission medications   Medication Sig Start Date End Date Taking? Authorizing Provider  allopurinol (ZYLOPRIM) 300 MG tablet Take 1 tablet (300 mg total) by mouth daily. 03/26/14   Adrian BlackwaterZachary H Shuntae Herzig, PA-C  atenolol (TENORMIN) 100 MG tablet Take 1 tablet (100 mg total) by mouth daily. Take two tablets twice daily 03/26/14   Graylon GoodZachary H Tennyson Wacha, PA-C  colchicine 0.6 MG tablet Take 0.5-1 tablets (0.3-0.6 mg total) by mouth 2 (two) times daily as needed. For gout 03/26/14   Graylon GoodZachary H Dana Debo, PA-C  EPINEPHrine (EPIPEN) 0.3 mg/0.3 mL SOAJ injection Inject 0.3 mLs (0.3 mg total) into the muscle once. 11/17/13   Gerhard Munchobert Lockwood, MD  esomeprazole (NEXIUM) 40 MG capsule Take 1 capsule (40 mg total) by mouth daily before breakfast. 03/26/14   Adrian BlackwaterZachary H  Maryon Kemnitz, PA-C  hydrochlorothiazide (HYDRODIURIL) 25 MG tablet Take 1 tablet (25 mg total) by mouth daily. 03/26/14   Adrian Blackwater Wise Fees, PA-C  lisinopril (PRINIVIL,ZESTRIL) 40 MG tablet Take 1 tablet (40 mg total) by mouth daily. 03/26/14   Graylon Good, PA-C  loratadine (CLARITIN) 10 MG tablet Take 1 tablet (10 mg total) by mouth daily. 11/15/12   Alison Murray, MD  metFORMIN (GLUCOPHAGE) 500 MG tablet Take 1 tablet (500 mg total) by mouth 2 (two) times daily  with a meal. 03/26/14   Graylon Good, PA-C  pravastatin (PRAVACHOL) 20 MG tablet Take 1 tablet (20 mg total) by mouth at bedtime. 03/26/14   Adrian Blackwater Amelya Mabry, PA-C   BP 191/110  Pulse 88  Temp(Src) 98 F (36.7 C) (Oral)  Resp 12  SpO2 100% Physical Exam  Nursing note and vitals reviewed. Constitutional: He is oriented to person, place, and time. He appears well-developed and well-nourished. No distress.  HENT:  Head: Normocephalic and atraumatic.  Cardiovascular: Normal rate, regular rhythm and normal heart sounds.   Pulmonary/Chest: Effort normal and breath sounds normal. No respiratory distress.  Neurological: He is alert and oriented to person, place, and time. Coordination normal.  Skin: Skin is warm and dry. No rash noted. He is not diaphoretic.  Psychiatric: He has a normal mood and affect. Judgment normal.    ED Course  Procedures (including critical care time) Labs Review Labs Reviewed  POCT I-STAT, CHEM 8 - Abnormal; Notable for the following:    Glucose, Bld 144 (*)    All other components within normal limits    Imaging Review No results found.   MDM   1. Gout   2. Unspecified essential hypertension   3. GERD (gastroesophageal reflux disease)   4. Diabetes    Medications refill. He was not on a statin, he thinks that he used to be on something for cholesterol, will start 20 mg pravastatin. He didn't tolerate cold she now, advised him that take one half a tablet considerable tablet as needed up to 2 times daily for gout pain since restarting allopurinol. We'll start daily meloxicam for prophylaxis. Followup with primary care ASAP   Meds ordered this encounter  Medications  . allopurinol (ZYLOPRIM) 300 MG tablet    Sig: Take 1 tablet (300 mg total) by mouth daily.    Dispense:  30 tablet    Refill:  1    Order Specific Question:  Supervising Provider    Answer:  Linna Hoff [phone]  . atenolol (TENORMIN) 100 MG tablet    Sig: Take 1 tablet (100 mg  total) by mouth daily. Take two tablets twice daily    Dispense:  30 tablet    Refill:  1    Order Specific Question:  Supervising Provider    Answer:  Linna Hoff [phone]  . esomeprazole (NEXIUM) 40 MG capsule    Sig: Take 1 capsule (40 mg total) by mouth daily before breakfast.    Dispense:  30 capsule    Refill:  1    Order Specific Question:  Supervising Provider    Answer:  Linna Hoff [phone]  . hydrochlorothiazide (HYDRODIURIL) 25 MG tablet    Sig: Take 1 tablet (25 mg total) by mouth daily.    Dispense:  30 tablet    Refill:  1    Order Specific Question:  Supervising Provider    Answer:  Linna Hoff [phone]  . lisinopril (PRINIVIL,ZESTRIL) 40 MG tablet  Sig: Take 1 tablet (40 mg total) by mouth daily.    Dispense:  30 tablet    Refill:  1    Order Specific Question:  Supervising Provider    Answer:  Linna Hoff [phone]  . metFORMIN (GLUCOPHAGE) 500 MG tablet    Sig: Take 1 tablet (500 mg total) by mouth 2 (two) times daily with a meal.    Dispense:  60 tablet    Refill:  1    Order Specific Question:  Supervising Provider    Answer:  Linna Hoff [phone]  . colchicine 0.6 MG tablet    Sig: Take 0.5-1 tablets (0.3-0.6 mg total) by mouth 2 (two) times daily as needed. For gout    Dispense:  20 tablet    Refill:  0    Order Specific Question:  Supervising Provider    Answer:  Linna Hoff [phone]  . pravastatin (PRAVACHOL) 20 MG tablet    Sig: Take 1 tablet (20 mg total) by mouth at bedtime.    Dispense:  30 tablet    Refill:  1    Order Specific Question:  Supervising Provider    Answer:  Bradd Canary D [[id number]]       Graylon Good, PA-C 03/26/14 1101

## 2014-03-26 NOTE — ED Notes (Signed)
Pt. Stated, here to get medication refilled.

## 2014-03-26 NOTE — ED Notes (Signed)
Pt. Stated, I'm out of my medication so I've been taken them every other day.

## 2014-03-26 NOTE — ED Provider Notes (Signed)
Medical screening examination/treatment/procedure(s) were performed by resident physician or non-physician practitioner and as supervising physician I was immediately available for consultation/collaboration.   Barkley Bruns MD.   Linna Hoff, MD 03/26/14 [phone]

## 2014-03-26 NOTE — Discharge Instructions (Signed)
Heart Disease Prevention Heart disease can lead to heart attacks and strokes. This is a leading cause of death. Heart disease can be inherited and can be caused from the lifestyle you lead. You can do a lot to keep your heart and blood vessels healthy.  WHAT SHOULD I DO EACH DAY TO KEEP MY HEART HEALTHY?  Do not smoke.  Follow a healthy eating plan as recommended by your caregiver or dietitian.  Be active for a total of 30 minutes most days. Ask your caregiver what activities are best for you.  Limit the amount of alcohol you drink.  Involve family and friends to help you with a healthy lifestyle. HOW DOES HEART DISEASE CAUSE HIGH BLOOD PRESSURE?  Narrowed blood vessels leave a smaller opening for blood to flow through. It is like turning on a garden hose and holding your thumb over the opening. The smaller opening makes the water shoot out with more pressure. In the same way, narrowed blood vessels can lead to high blood pressure. Other factors, such as kidney problems and being overweight, also can lead to high blood pressure.  If you have high blood pressure you may need to take blood pressure medicine every day. Some types of blood pressure medicine can also help keep your kidneys healthy.  Many people with diabetes also have high blood pressure. If you have heart, eye, or kidney problems from diabetes, high blood pressure can make them worse. HOW DO MY BLOOD VESSELS GET CLOGGED?  Cholesterol is a substance that is made by the body and used for many important functions. It is also found in food that comes from animals. When your cholesterol is high, it can stick to the insides of your blood vessels, making them narrowed and even clogged. This problem is called atherosclerosis.  Narrowed and clogged blood vessels make it harder for blood to get to important body organs. This can cause problems such as:  Chest pain (angina). Angina can cause temporary pain in your chest, arms, shoulders,  or back. You may feel the pain more when your heart beats faster, such as when you exercise. The pain may go away when you rest. You also may feel very weak and sweaty.  A heart attack. A heart attack happens when a blood vessel in or near the heart becomes blocked. Not enough blood is getting to the heart. During a heart attack, you may have chest pain in your chest, arms, shoulders, or back along with nausea, indigestion, extreme weakness, and sweating. WHAT CAN I DO TO PREVENT HEART DISEASE?   Keep your blood pressure under control as recommended by your caregiver.  Keep your cholesterol under control. Have it checked at least once a year. Target cholesterol levels for most people are:  Total blood cholesterol level: Below 200.  LDL (bad) cholesterol: Below 100.  HDL (good) cholesterol: Above 40 in men and above 50 in women.  Triglycerides (another type of fat in the blood): Below 150.  Make physical activity a part of your daily routine. Check with your caregiver to learn what activities are best for you.  Make sure that the foods you eat are "heart-healthy."  Include foods high in fiber, such as oat bran, oatmeal, whole-grain breads and cereals.  Cut back on fried foods and foods high in saturated fat. This includes foods such as meats, butter, whole dairy products, shortening, and coconut or palm oil.  Avoid salty foods such as canned food, luncheon meat, salty snacks, and fast food.    Eat more fruits and vegetables.  Drink less alcohol.  Lose weight as recommended by your caregiver.  If you smoke, quit. Your caregiver can help you with quitting options.  Ask your caregiver whether you should take a daily aspirin. Studies have shown that taking aspirin can help reduce your risk of heart disease and stroke.  Take your prescribed medicines as directed. WHAT ARE THE WARNING SIGNS OF A HEART ATTACK? You may have one or more of the following warning signs:  Chest pain or  discomfort.  Pain or discomfort in your arms, back, jaw, or neck.  Indigestion or stomach pain.  Shortness of breath.  Sweating.  Nausea or vomiting.  Lightheadedness.  No warning signs at all or they may come and go. FOR MORE INFORMATION  To find out more about heart disease and stroke prevention, visit the American Heart Association website at www.americanheart.org Document Released: 04/06/2004 Document Revised: 02/22/2012 Document Reviewed: 10/20/2007 [employer] Patient Information 2015 [place]. This information is not intended to replace advice given to you by your health care provider. Make sure you discuss any questions you have with your health care provider.  DASH Eating Plan DASH stands for "Dietary Approaches to Stop Hypertension." The DASH eating plan is a healthy eating plan that has been shown to reduce high blood pressure (hypertension). Additional health benefits may include reducing the risk of type 2 diabetes mellitus, heart disease, and stroke. The DASH eating plan may also help with weight loss. WHAT DO I NEED TO KNOW ABOUT THE DASH EATING PLAN? For the DASH eating plan, you will follow these general guidelines:  Choose foods with a percent daily value for sodium of less than 5% (as listed on the food label).  Use salt-free seasonings or herbs instead of table salt or sea salt.  Check with your health care provider or pharmacist before using salt substitutes.  Eat lower-sodium products, often labeled as "lower sodium" or "no salt added."  Eat fresh foods.  Eat more vegetables, fruits, and low-fat dairy products.  Choose whole grains. Look for the word "whole" as the first word in the ingredient list.  Choose fish and skinless chicken or [place] more often than red meat. Limit fish, poultry, and meat to 6 oz (170 g) each day.  Limit sweets, desserts, sugars, and sugary drinks.  Choose heart-healthy fats.  Limit cheese to 1 oz (28 g) per day.  Eat  more home-cooked food and less restaurant, buffet, and fast food.  Limit fried foods.  Cook foods using methods other than frying.  Limit canned vegetables. If you do use them, rinse them well to decrease the sodium.  When eating at a restaurant, ask that your food be prepared with less salt, or no salt if possible. WHAT FOODS CAN I EAT? Seek help from a dietitian for individual calorie needs. Grains Whole grain or whole wheat bread. Brown rice. Whole grain or whole wheat pasta. Quinoa, bulgur, and whole grain cereals. Low-sodium cereals. Corn or whole wheat flour tortillas. Whole grain cornbread. Whole grain crackers. Low-sodium crackers. Vegetables Fresh or frozen vegetables (raw, steamed, roasted, or grilled). Low-sodium or reduced-sodium tomato and vegetable juices. Low-sodium or reduced-sodium tomato sauce and paste. Low-sodium or reduced-sodium canned vegetables.  Fruits All fresh, canned (in natural juice), or frozen fruits. Meat and Other Protein Products Ground beef (85% or leaner), grass-fed beef, or beef trimmed of fat. Skinless chicken or [place]. Ground chicken or [place]. Pork trimmed of fat. All fish and seafood. Eggs. Dried beans,  peas, or lentils. Unsalted nuts and seeds. Unsalted canned beans. Dairy Low-fat dairy products, such as skim or 1% milk, 2% or reduced-fat cheeses, low-fat ricotta or cottage cheese, or plain low-fat yogurt. Low-sodium or reduced-sodium cheeses. Fats and Oils Tub margarines without trans fats. Light or reduced-fat mayonnaise and salad dressings (reduced sodium). Avocado. Safflower, olive, or canola oils. Natural peanut or almond butter. Other Unsalted popcorn and pretzels. The items listed above may not be a complete list of recommended foods or beverages. Contact your dietitian for more options. WHAT FOODS ARE NOT RECOMMENDED? Grains White bread. White pasta. White rice. Refined cornbread. Bagels and croissants. Crackers that contain trans  fat. Vegetables Creamed or fried vegetables. Vegetables in a cheese sauce. Regular canned vegetables. Regular canned tomato sauce and paste. Regular tomato and vegetable juices. Fruits Dried fruits. Canned fruit in light or heavy syrup. Fruit juice. Meat and Other Protein Products Fatty cuts of meat. Ribs, chicken wings, bacon, sausage, bologna, salami, chitterlings, fatback, hot dogs, bratwurst, and packaged luncheon meats. Salted nuts and seeds. Canned beans with salt. Dairy Whole or 2% milk, cream, half-and-half, and cream cheese. Whole-fat or sweetened yogurt. Full-fat cheeses or blue cheese. Nondairy creamers and whipped toppings. Processed cheese, cheese spreads, or cheese curds. Condiments Onion and garlic salt, seasoned salt, table salt, and sea salt. Canned and packaged gravies. Worcestershire sauce. Tartar sauce. Barbecue sauce. Teriyaki sauce. Soy sauce, including reduced sodium. Steak sauce. Fish sauce. Oyster sauce. Cocktail sauce. Horseradish. Ketchup and mustard. Meat flavorings and tenderizers. Bouillon cubes. Hot sauce. Tabasco sauce. Marinades. Taco seasonings. Relishes. Fats and Oils Butter, stick margarine, lard, shortening, ghee, and bacon fat. Coconut, palm kernel, or palm oils. Regular salad dressings. Other Pickles and olives. Salted popcorn and pretzels. The items listed above may not be a complete list of foods and beverages to avoid. Contact your dietitian for more information. WHERE CAN I FIND MORE INFORMATION? National Heart, Lung, and Blood Institute: [employer] Document Released: 08/12/2011 Document Revised: 08/28/2013 Document Reviewed: 06/27/2013 [facility] Patient Information 2015 [place], [place]. This information is not intended to replace advice given to you by your health care provider. Make sure you discuss any questions you have with your health care provider.

## 2014-03-26 NOTE — ED Notes (Signed)
Pt. Stated, I don't know my medicines they are on file.

## 2014-04-01 ENCOUNTER — Ambulatory Visit: Payer: Medicare Other | Admitting: Internal Medicine

## 2014-04-04 ENCOUNTER — Ambulatory Visit: Payer: Medicare Other | Admitting: Internal Medicine

## 2014-06-25 ENCOUNTER — Encounter (HOSPITAL_COMMUNITY): Payer: Self-pay | Admitting: Emergency Medicine

## 2014-06-25 ENCOUNTER — Emergency Department (HOSPITAL_COMMUNITY)
Admission: EM | Admit: 2014-06-25 | Discharge: 2014-06-26 | Disposition: A | Discharge status: Home / Self Care | Payer: Medicare Other | Attending: Emergency Medicine | Admitting: Emergency Medicine

## 2014-06-25 DIAGNOSIS — I10 Essential (primary) hypertension: Secondary | ICD-10-CM | POA: Diagnosis not present

## 2014-06-25 DIAGNOSIS — M79604 Pain in right leg: Secondary | ICD-10-CM | POA: Diagnosis present

## 2014-06-25 DIAGNOSIS — Z7952 Long term (current) use of systemic steroids: Secondary | ICD-10-CM | POA: Insufficient documentation

## 2014-06-25 DIAGNOSIS — E780 Pure hypercholesterolemia: Secondary | ICD-10-CM | POA: Insufficient documentation

## 2014-06-25 DIAGNOSIS — M109 Gout, unspecified: Secondary | ICD-10-CM

## 2014-06-25 DIAGNOSIS — E119 Type 2 diabetes mellitus without complications: Secondary | ICD-10-CM | POA: Diagnosis not present

## 2014-06-25 DIAGNOSIS — K219 Gastro-esophageal reflux disease without esophagitis: Secondary | ICD-10-CM | POA: Insufficient documentation

## 2014-06-25 DIAGNOSIS — Z79899 Other long term (current) drug therapy: Secondary | ICD-10-CM | POA: Insufficient documentation

## 2014-06-25 NOTE — ED Notes (Signed)
Pt complains of right leg pain, gout pain, since Sunday, medications do not work.

## 2014-06-26 MED ORDER — COLCHICINE 0.6 MG PO TABS
1.2000 mg | ORAL_TABLET | Freq: Once | ORAL | Status: AC
  Administered 2014-06-26: 1.2 mg via ORAL
  Filled 2014-06-26: qty 2

## 2014-06-26 MED ORDER — PREDNISONE 20 MG PO TABS
60.0000 mg | ORAL_TABLET | Freq: Once | ORAL | Status: AC
  Administered 2014-06-26: 60 mg via ORAL
  Filled 2014-06-26: qty 3

## 2014-06-26 MED ORDER — OXYCODONE-ACETAMINOPHEN 5-325 MG PO TABS: 2.0000 | ORAL_TABLET | ORAL | Status: DC | PRN

## 2014-06-26 MED ORDER — COLCHICINE 0.6 MG PO TABS: 0.6000 mg | ORAL_TABLET | Freq: Two times a day (BID) | ORAL | Status: DC

## 2014-06-26 MED ORDER — PREDNISONE 20 MG PO TABS: 60.0000 mg | ORAL_TABLET | Freq: Every day | ORAL | Status: DC

## 2014-06-26 MED ORDER — INDOMETHACIN 25 MG PO CAPS: 25.0000 mg | ORAL_CAPSULE | Freq: Three times a day (TID) | ORAL | Status: DC | PRN

## 2014-06-26 MED ORDER — OXYCODONE-ACETAMINOPHEN 5-325 MG PO TABS
2.0000 | ORAL_TABLET | Freq: Once | ORAL | Status: AC
  Administered 2014-06-26: 2 via ORAL
  Filled 2014-06-26: qty 2

## 2014-06-26 MED ORDER — INDOMETHACIN 25 MG PO CAPS
25.0000 mg | ORAL_CAPSULE | Freq: Once | ORAL | Status: AC
  Administered 2014-06-26: 25 mg via ORAL
  Filled 2014-06-26: qty 1

## 2014-06-26 MED ORDER — COLCHICINE 0.6 MG PO TABS: 0.6000 mg | ORAL_TABLET | Freq: Once | ORAL | Status: DC

## 2014-06-26 NOTE — Discharge Instructions (Signed)
Gout Gout is an inflammatory arthritis caused by a buildup of uric acid crystals in the joints. Uric acid is a chemical that is normally present in the blood. When the level of uric acid in the blood is too high it can form crystals that deposit in your joints and tissues. This causes joint redness, soreness, and swelling (inflammation). Repeat attacks are common. Over time, uric acid crystals can form into masses (tophi) near a joint, destroying bone and causing disfigurement. Gout is treatable and often preventable. CAUSES  The disease begins with elevated levels of uric acid in the blood. Uric acid is produced by your body when it breaks down a naturally found substance called purines. Certain foods you eat, such as meats and fish, contain high amounts of purines. Causes of an elevated uric acid level include:  Being passed down from parent to child (heredity).  Diseases that cause increased uric acid production (such as obesity, psoriasis, and certain cancers).  Excessive alcohol use.  Diet, especially diets rich in meat and seafood.  Medicines, including certain cancer-fighting medicines (chemotherapy), water pills (diuretics), and aspirin.  Chronic kidney disease. The kidneys are no longer able to remove uric acid well.  Problems with metabolism. Conditions strongly associated with gout include:  Obesity.  High blood pressure.  High cholesterol.  Diabetes. Not everyone with elevated uric acid levels gets gout. It is not understood why some people get gout and others do not. Surgery, joint injury, and eating too much of certain foods are some of the factors that can lead to gout attacks. SYMPTOMS   An attack of gout comes on quickly. It causes intense pain with redness, swelling, and warmth in a joint.  Fever can occur.  Often, only one joint is involved. Certain joints are more commonly involved:  Base of the big toe.  Knee.  Ankle.  Wrist.  Finger. Without  treatment, an attack usually goes away in a few days to weeks. Between attacks, you usually will not have symptoms, which is different from many other forms of arthritis. DIAGNOSIS  Your caregiver will suspect gout based on your symptoms and exam. In some cases, tests may be recommended. The tests may include:  Blood tests.  Urine tests.  X-rays.  Joint fluid exam. This exam requires a needle to remove fluid from the joint (arthrocentesis). Using a microscope, gout is confirmed when uric acid crystals are seen in the joint fluid. TREATMENT  There are two phases to gout treatment: treating the sudden onset (acute) attack and preventing attacks (prophylaxis).  Treatment of an Acute Attack.  Medicines are used. These include anti-inflammatory medicines or steroid medicines.  An injection of steroid medicine into the affected joint is sometimes necessary.  The painful joint is rested. Movement can worsen the arthritis.  You may use warm or cold treatments on painful joints, depending which works best for you.  Treatment to Prevent Attacks.  If you suffer from frequent gout attacks, your caregiver may advise preventive medicine. These medicines are started after the acute attack subsides. These medicines either help your kidneys eliminate uric acid from your body or decrease your uric acid production. You may need to stay on these medicines for a very long time.  The early phase of treatment with preventive medicine can be associated with an increase in acute gout attacks. For this reason, during the first few months of treatment, your caregiver may also advise you to take medicines usually used for acute gout treatment. Be sure you  understand your caregiver's directions. Your caregiver may make several adjustments to your medicine dose before these medicines are effective.  Discuss dietary treatment with your caregiver or dietitian. Alcohol and drinks high in sugar and fructose and foods  such as meat, poultry, and seafood can increase uric acid levels. Your caregiver or dietitian can advise you on drinks and foods that should be limited. HOME CARE INSTRUCTIONS   Do not take aspirin to relieve pain. This raises uric acid levels.  Only take over-the-counter or prescription medicines for pain, discomfort, or fever as directed by your caregiver.  Rest the joint as much as possible. When in bed, keep sheets and blankets off painful areas.  Keep the affected joint raised (elevated).  Apply warm or cold treatments to painful joints. Use of warm or cold treatments depends on which works best for you.  Use crutches if the painful joint is in your leg.  Drink enough fluids to keep your urine clear or pale yellow. This helps your body get rid of uric acid. Limit alcohol, sugary drinks, and fructose drinks.  Follow your dietary instructions. Pay careful attention to the amount of protein you eat. Your daily diet should emphasize fruits, vegetables, whole grains, and fat-free or low-fat milk products. Discuss the use of coffee, vitamin C, and cherries with your caregiver or dietitian. These may be helpful in lowering uric acid levels.  Maintain a healthy body weight. SEEK MEDICAL CARE IF:   You develop diarrhea, vomiting, or any side effects from medicines.  You do not feel better in 24 hours, or you are getting worse. SEEK IMMEDIATE MEDICAL CARE IF:   Your joint becomes suddenly more tender, and you have chills or a fever. MAKE SURE YOU:   Understand these instructions.  Will watch your condition.  Will get help right away if you are not doing well or get worse. Document Released: 08/20/2000 Document Revised: 01/07/2014 Document Reviewed: 04/05/2012 [facility] Patient Information 2015 [place], [place]. This information is not intended to replace advice given to you by your health care provider. Make sure you discuss any questions you have with your health care  provider.  Hypertension Hypertension, commonly called high blood pressure, is when the force of blood pumping through your arteries is too strong. Your arteries are the blood vessels that carry blood from your heart throughout your body. A blood pressure reading consists of a higher number over a lower number, such as 110/72. The higher number (systolic) is the pressure inside your arteries when your heart pumps. The lower number (diastolic) is the pressure inside your arteries when your heart relaxes. Ideally you want your blood pressure below 120/80. Hypertension forces your heart to work harder to pump blood. Your arteries may become narrow or stiff. Having hypertension puts you at risk for heart disease, stroke, and other problems.  RISK FACTORS Some risk factors for high blood pressure are controllable. Others are not.  Risk factors you cannot control include:   Race. You may be at higher risk if you are African American.  Age. Risk increases with age.  Gender. Men are at higher risk than women before age 32 years. After age 41, women are at higher risk than men. Risk factors you can control include:  Not getting enough exercise or physical activity.  Being overweight.  Getting too much fat, sugar, calories, or salt in your diet.  Drinking too much alcohol. SIGNS AND SYMPTOMS Hypertension does not usually cause signs or symptoms. Extremely high blood pressure (hypertensive  crisis) may cause headache, anxiety, shortness of breath, and nosebleed. DIAGNOSIS  To check if you have hypertension, your health care provider will measure your blood pressure while you are seated, with your arm held at the level of your heart. It should be measured at least twice using the same arm. Certain conditions can cause a difference in blood pressure between your right and left arms. A blood pressure reading that is higher than normal on one occasion does not mean that you need treatment. If one blood  pressure reading is high, ask your health care provider about having it checked again. TREATMENT  Treating high blood pressure includes making lifestyle changes and possibly taking medicine. Living a healthy lifestyle can help lower high blood pressure. You may need to change some of your habits. Lifestyle changes may include:  Following the DASH diet. This diet is high in fruits, vegetables, and whole grains. It is low in salt, red meat, and added sugars.  Getting at least 2 hours of brisk physical activity every week.  Losing weight if necessary.  Not smoking.  Limiting alcoholic beverages.  Learning ways to reduce stress. If lifestyle changes are not enough to get your blood pressure under control, your health care provider may prescribe medicine. You may need to take more than one. Work closely with your health care provider to understand the risks and benefits. HOME CARE INSTRUCTIONS  Have your blood pressure rechecked as directed by your health care provider.   Take medicines only as directed by your health care provider. Follow the directions carefully. Blood pressure medicines must be taken as prescribed. The medicine does not work as well when you skip doses. Skipping doses also puts you at risk for problems.   Do not smoke.   Monitor your blood pressure at home as directed by your health care provider. SEEK MEDICAL CARE IF:   You think you are having a reaction to medicines taken.  You have recurrent headaches or feel dizzy.  You have swelling in your ankles.  You have trouble with your vision. SEEK IMMEDIATE MEDICAL CARE IF:  You develop a severe headache or confusion.  You have unusual weakness, numbness, or feel faint.  You have severe chest or abdominal pain.  You vomit repeatedly.  You have trouble breathing. MAKE SURE YOU:   Understand these instructions.  Will watch your condition.  Will get help right away if you are not doing well or get  worse. Document Released: 08/23/2005 Document Revised: 01/07/2014 Document Reviewed: 06/15/2013 [facility] Patient Information 2015 [place], [place]. This information is not intended to replace advice given to you by your health care provider. Make sure you discuss any questions you have with your health care provider.   Emergency Department Resource Guide 1) Find a Doctor and Pay Out of Pocket Although you won't have to find out who is covered by your insurance plan, it is a good idea to ask around and get recommendations. You will then need to call the office and see if the doctor you have chosen will accept you as a new patient and what types of options they offer for patients who are self-pay. Some doctors offer discounts or will set up payment plans for their patients who do not have insurance, but you will need to ask so you aren't surprised when you get to your appointment.  2) Contact Your Local Health Department Not all health departments have doctors that can see patients for sick visits, but many do, so  it is worth a call to see if yours does. If you don't know where your local health department is, you can check in your phone book. The CDC also has a tool to help you locate your state's health department, and many state websites also have listings of all of their local health departments.  3) Find a [facility] If your illness is not likely to be very severe or complicated, you may want to try a walk in clinic. These are popping up all over the country in pharmacies, drugstores, and shopping centers. They're usually staffed by nurse practitioners or physician assistants that have been trained to treat common illnesses and complaints. They're usually fairly quick and inexpensive. However, if you have serious medical issues or chronic medical problems, these are probably not your best option.  No Primary Care Doctor: - Call Health Connect at  [phone] - they can help you locate a primary  care doctor that  accepts your insurance, provides certain services, etc. - Physician Referral Service- [phone]  Chronic Pain Problems: Organization         Address  Phone   Notes  [facility]  [phone] Patients need to be referred by their primary care doctor.   Medication Assistance: Organization         Address  Phone   Notes  [facility] Medication [facility] [address] [place], [place] [address] [phone] --Must be a resident of [facility] -- Must have NO insurance coverage whatsoever (no Medicaid/ Medicare, etc.) -- The pt. MUST have a primary care doctor that directs their care regularly and follows them in the community   MedAssist  [phone]   [employer]  [phone]    Agencies that provide inexpensive medical care: Organization         Address  Phone   Notes  [place]  [phone]   [facility]    [phone]   [facility] [place], [place] [address] [phone]   [place] [address] [place] [phone]   Planned Parenthood    [phone]   [facility]    [phone]   [place] and [place] [address], [place] Phone:  [phone], Fax:  [phone] Hours of Operation:  9 am - 6 pm, M-F.  Also accepts Medicaid/Medicare and self-pay.  [facility] for [place] [address], [address], [place] Phone: [phone], Fax: [phone]. Hours of Operation:  8:30 am - 5:30 pm, M-F.  Also accepts Medicaid and self-pay.  [facility] [place] [address] [place] Phone: [phone]   [place], [place], [place] [phone], Ext. 123 Mondays & Thursdays: 7-9 AM.  First 15 patients are seen on a first come, first serve basis.    [place]  Providers:  Organization         Address  Phone   Notes  [facility] [address], [place] [phone] Also accepts self-pay patients.  [place], [place]  [phone]   [place], [address], [place] [phone]   [place] [address] [place] [phone]   Lucianne Lei [address], [place]   [phone] Only accepts [place] patients after they have their name applied to their card.   Self-Pay (no insurance) in [facility]:  Organization         Address  Phone   Notes  Sickle Cell Patients, [facility] [place] [phone]   [facility] [place] [phone]   [facility] [place]  [place], [place], [place] [phone]   [facility]/Dr. Osei-Bonsu  [address] [place] or [address], [place] [phone] Phone number for both [place] and [place] locations is the same.  [facility] [address] [place] [phone]   [facility] [address] [place] or [address] [phone]   [facility] [address], [place] [phone], phone; [phone], fax Sees patients 1st and 3rd Saturday of every month.  Must not qualify for public or private insurance (i.e. Medicaid, Medicare, [place] Health Choice, Veterans' Benefits)  Household income should be no more than 200% of the poverty level The clinic cannot treat you if you are pregnant or think you are pregnant  Sexually transmitted diseases are not treated at the clinic.    Dental Care: Organization         Address  Phone  Notes  [facility] Department of [facility] [place] [phone] Accepts children up to age 3 who are enrolled in [place] or [place]; pregnant women with a Medicaid card; and children who have applied for Medicaid or [place] Health Choice, but were declined, whose parents can pay a reduced fee at time of service.  [facility] Department of [facility]  [address], [place] [phone] Accepts children up to age 22 who are enrolled in [place] or [place]; pregnant women with a Medicaid card; and children who have applied for Medicaid or [place] Health Choice, but were declined, whose parents can pay a reduced fee at time of service.  [place] Adult Dental Access PROGRAM  [place] [phone] Patients are seen by appointment only. Walk-ins are not accepted. [place] will see patients 75 years of age and older. Monday - Tuesday (8am-5pm) Most Wednesdays (8:30-5pm) $30 per visit, cash only  [facility] Adult Dental Access PROGRAM  [address], [facility] [phone] Patients are seen by appointment only. Walk-ins are not accepted. [place] will see patients 79 years of age and older. One Wednesday Evening (Monthly: Volunteer Based).  $30 per visit, cash only  [place]  [phone] for adults; Children under age 57, call Graduate Pediatric Dentistry at [phone]. Children aged 59-14, please call [phone] to request a pediatric application.  Dental services are provided in all areas of dental care including fillings, crowns and bridges, complete and partial dentures, implants, gum treatment, root canals, and extractions. Preventive care is also provided. Treatment is provided to both adults and children. Patients are selected via a lottery and there is often a waiting list.   [facility] [address], [place]  [phone] [email or web address], [place], [place] [phone], Ext. [place]  Thursday of each month, opens at 6:30 AM; Clinic ends at 9 AM.  Patients are seen on a first-come first-served basis, and a limited number are seen during each clinic.   [facility]  [address] Hillard Danker [place], [place] [phone]   Eligibility Requirements You must have lived in [place], [place], or [place] counties for at least the last three months.   You cannot be eligible for state or federal sponsored [employer], including [employer], [place], or [employer].   You generally cannot be eligible for healthcare insurance through your employer.    How to apply: Eligibility screenings are held every Tuesday and Wednesday afternoon from 1:00 pm until 4:00 pm. You do not need an appointment for the interview!  [facility] [address] [place], [place]   [place]  [place] Department  [place]  [phone]    Behavioral Health Resources in the Community: Intensive Outpatient Programs Organization         Address  Phone  Notes  [place]. [address], [place], [place] [phone]   [facility] Outpatient [address], [place], [place]   ADS: Alcohol [facility] Drug S[facility]cs [address] [place], [place]   [place] [address],  [place], [place] or [phone]   Substance Abuse Resources Organization         Address  Phone  Notes  Alcohol and Drug Services  [phone]   [place]  [phone]   [facility] [place]   Chinita Pester  [phone]   Residential & Outpatient Substance Abuse Program  [phone]   Psychological Services Organization         Address  Phone  Notes  [facility] [place]  [place]  [phone]   [place] [address], [place] or [phone]    Mobile Crisis Teams Organization         Address  Phone  Notes  [facility], Mobile Crisis Care Unit  [phone]   [facility]  [address] [place], [place]   [place] Levels [address] [place] [phone]    Self-Help/Support Groups Organization         Address  Phone             Notes  [place]. of [place] - variety of support groups  [place] Call for more information  Narcotics Anonymous (NA), [facility] [address], [employer] [place]  2 meetings at this location   Special educational needs teacher         Address  Phone  Notes  [facility]idential Treatment [place],    [place]  [phone]   [facility]  [address] [place] 867672, [place], [place]   [place], [place] [phone] Admissions: 8am-3pm M-F  Incentives Substance [place] [address]    [place], [place] [phone]   [facility] [address] Jadene Pierini [place], [place]   The [place].,  [place], [place] - Intensive Outpatient [address], [place], [place]   [facility] ([place],  [place], [place] [phone] or [phone]   [facility] Services (RTS) [address] [place], [place] [phone] Accepts Medicaid  [facility] [address]  [place] [phone] Substance Abuse/Addiction Treatment   [facility] Resources Organization         Address  Phone  Notes  [facility]  [phone]) [phone]   Angie FavaJulie Brannon, PhD [address] [place], [place]   [phone]) [phone] or [phone]    [facility]   [address] [place], [place] [phone](336) [phone]   [facility] [address], [place], [place] [phone] Insurance/Medicaid/sponsorship through [facility]  [facility] Families [address]                                    [place], [place] [phone] Therapy/tele-psych/case  [facility] [address]   [place], [place] [phone]) [phone]    Dr. Lolly MustacheArfeen  [phone]   [facility]  [employer] [facility] 1) [address], [place] 2) [address], [place] 3)  [address], [place] [phone]) [phone]) [phone]  [phone]) [phone]   [facility] Child Abuse Hotline [phone]) 7474754389 or [phone]) [phone] (After Hours)

## 2014-06-26 NOTE — ED Provider Notes (Signed)
CSN: [id number]     Arrival date & time 06/25/14  2303 History   First MD Initiated Contact with Patient 06/26/14 0353     Chief Complaint  Patient presents with  . Leg Pain     (Consider location/radiation/quality/duration/timing/severity/associated sxs/prior Treatment) HPI 53 year old male presents to emergency department from home with complaint of right knee pain.  Symptoms started on Sunday with swelling sharp pain, warmth.  Patient has history of gout, reports symptoms are identical to prior gouty flares.  He has been taking his allopurinol without improvement.  He is able to walk but it is difficult and has been using crutches.  He denies any fever or chills.  Patient noted be significantly hypertensive.  He did not take his evening dose of blood pressure medicine, and also reports his blood pressure spikes when he is having pain.  He does not currently have a primary care Dr. Past Medical History  Diagnosis Date  . Hypertension   . Hypercholesteremia   . Baker's cyst of knee   . Type II diabetes mellitus   . GERD (gastroesophageal reflux disease)   . Gouty arthritis     "real bad" (01/17/2013)   Past Surgical History  Procedure Laterality Date  . Finger amputation Right 1980's    3rd & 4th digits "got them mashed" (01/17/2013)  . Knee arthroscopy Right 1980's   Family History  Problem Relation Age of Onset  . Diabetes Father   . Hypertension Mother   . Hypertension Father   . Hypertension Sister   . Hypertension Brother   . Hypertension Brother   . Hypertension Brother    History  Substance Use Topics  . Smoking status: Never Smoker   . Smokeless tobacco: Never Used  . Alcohol Use: 1.8 oz/week    3 Shots of liquor per week     Comment: 01/17/2013 "2-3 shots of liquor maybe once/wk; I've quit"    Review of Systems  See History of Present Illness; otherwise all other systems are reviewed and negative   Allergies  Other and Hydrocodone-acetaminophen  Home  Medications   Prior to Admission medications   Medication Sig Start Date End Date Taking? Authorizing Provider  acetaminophen (TYLENOL) 325 MG tablet Take 650 mg by mouth every 6 (six) hours as needed (for pain.).   Yes Historical Provider, MD  allopurinol (ZYLOPRIM) 300 MG tablet Take 1 tablet (300 mg total) by mouth daily. 03/26/14  Yes Adrian Blackwater Baker, PA-C  atenolol (TENORMIN) 100 MG tablet Take 100 mg by mouth 2 (two) times daily.   Yes Historical Provider, MD  colchicine 0.6 MG tablet Take 0.5-1 tablets (0.3-0.6 mg total) by mouth 2 (two) times daily as needed. For gout 03/26/14  Yes Graylon Good, PA-C  esomeprazole (NEXIUM) 40 MG capsule Take 40 mg by mouth daily as needed (for acid reflux).   Yes Historical Provider, MD  hydrochlorothiazide (HYDRODIURIL) 25 MG tablet Take 1 tablet (25 mg total) by mouth daily. 03/26/14  Yes Adrian Blackwater Baker, PA-C  lisinopril (PRINIVIL,ZESTRIL) 40 MG tablet Take 1 tablet (40 mg total) by mouth daily. 03/26/14  Yes Graylon Good, PA-C  metFORMIN (GLUCOPHAGE) 500 MG tablet Take 1 tablet (500 mg total) by mouth 2 (two) times daily with a meal. 03/26/14  Yes Graylon Good, PA-C  pravastatin (PRAVACHOL) 20 MG tablet Take 1 tablet (20 mg total) by mouth at bedtime. 03/26/14  Yes Graylon Good, PA-C  colchicine 0.6 MG tablet Take 1 tablet (0.6 mg  total) by mouth 2 (two) times daily. For gout pain 06/26/14   Olivia Mackielga M Gwenlyn Hottinger, MD  indomethacin (INDOCIN) 25 MG capsule Take 1 capsule (25 mg total) by mouth 3 (three) times daily as needed. 06/26/14   Olivia Mackielga M Shreya Lacasse, MD  oxyCODONE-acetaminophen (PERCOCET/ROXICET) 5-325 MG per tablet Take 2 tablets by mouth every 4 (four) hours as needed for severe pain. 06/26/14   Olivia Mackielga M Londyn Hotard, MD  predniSONE (DELTASONE) 20 MG tablet Take 3 tablets (60 mg total) by mouth daily. 06/26/14   Olivia Mackielga M Kameko Hukill, MD   BP 214/134  Pulse 88  Temp(Src) 97.8 F (36.6 C) (Oral)  Resp 18  SpO2 100% Physical Exam  Nursing note and vitals  reviewed. Constitutional: He is oriented to person, place, and time. He appears well-developed and well-nourished.  HENT:  Head: Normocephalic and atraumatic.  Nose: Nose normal.  Mouth/Throat: Oropharynx is clear and moist.  Eyes: Conjunctivae and EOM are normal. Pupils are equal, round, and reactive to light.  Neck: Normal range of motion. Neck supple. No JVD present. No tracheal deviation present. No thyromegaly present.  Cardiovascular: Normal rate, regular rhythm, normal heart sounds and intact distal pulses.  Exam reveals no gallop and no friction rub.   No murmur heard. Pulmonary/Chest: Effort normal and breath sounds normal. No stridor. No respiratory distress. He has no wheezes. He has no rales. He exhibits no tenderness.  Abdominal: Soft. Bowel sounds are normal. He exhibits no distension and no mass. There is no tenderness. There is no rebound and no guarding.  Musculoskeletal: Normal range of motion. He exhibits edema and tenderness.  Effusion and tenderness to right knee.  He has limited range of motion secondary to pain.  There is no induration or erythema to suggest cellulitis or septic joint.  Lymphadenopathy:    He has no cervical adenopathy.  Neurological: He is alert and oriented to person, place, and time. He displays normal reflexes. He exhibits normal muscle tone. Coordination normal.  Skin: Skin is warm and dry. No rash noted. No erythema. No pallor.  Psychiatric: He has a normal mood and affect. His behavior is normal. Judgment and thought content normal.    ED Course  Procedures (including critical care time) Labs Review Labs Reviewed - No data to display  Imaging Review No results found.   EKG Interpretation None      MDM   Final diagnoses:  Acute gout of right knee, unspecified cause  Essential hypertension    53 year old male with acute gouty flare of right knee.  Has history of same in past.  He reports allopurinol as not helping.  Will start back  on colchicine, Indocin and Percocet.  Patient also given prednisone made aware that his blood sugar will be elevated.  Patient does not have a primary care Dr., outpatient resources given.  Patient with significant hypertension, he is instructed to go home and take his evening dose of blood pressure medication.  He is advised that he would need a close followup with her primary care doctor to further address his high blood pressure.    Olivia Mackielga M Brooklyne Radke, MD 06/27/14 [phone]

## 2014-06-26 NOTE — ED Notes (Signed)
Dr. Jenean Lindau is aware of patients BP. She would like to continue discharge and patient to take PM medications that are overdue.

## 2014-06-26 NOTE — ED Notes (Signed)
MD at bedside. 

## 2014-11-29 ENCOUNTER — Emergency Department (HOSPITAL_COMMUNITY): Payer: Commercial Managed Care - HMO

## 2014-11-29 ENCOUNTER — Encounter (HOSPITAL_COMMUNITY): Payer: Self-pay | Admitting: Emergency Medicine

## 2014-11-29 ENCOUNTER — Emergency Department (HOSPITAL_COMMUNITY)
Admission: EM | Admit: 2014-11-29 | Discharge: 2014-11-29 | Disposition: A | Discharge status: Home / Self Care | Payer: Commercial Managed Care - HMO | Attending: Emergency Medicine | Admitting: Emergency Medicine

## 2014-11-29 DIAGNOSIS — I10 Essential (primary) hypertension: Secondary | ICD-10-CM | POA: Diagnosis not present

## 2014-11-29 DIAGNOSIS — R0602 Shortness of breath: Secondary | ICD-10-CM | POA: Diagnosis not present

## 2014-11-29 DIAGNOSIS — Z79899 Other long term (current) drug therapy: Secondary | ICD-10-CM | POA: Insufficient documentation

## 2014-11-29 DIAGNOSIS — R509 Fever, unspecified: Secondary | ICD-10-CM

## 2014-11-29 DIAGNOSIS — E119 Type 2 diabetes mellitus without complications: Secondary | ICD-10-CM | POA: Insufficient documentation

## 2014-11-29 DIAGNOSIS — E780 Pure hypercholesterolemia: Secondary | ICD-10-CM | POA: Insufficient documentation

## 2014-11-29 DIAGNOSIS — M1990 Unspecified osteoarthritis, unspecified site: Secondary | ICD-10-CM | POA: Insufficient documentation

## 2014-11-29 DIAGNOSIS — R079 Chest pain, unspecified: Secondary | ICD-10-CM

## 2014-11-29 DIAGNOSIS — Z791 Long term (current) use of non-steroidal anti-inflammatories (NSAID): Secondary | ICD-10-CM | POA: Insufficient documentation

## 2014-11-29 DIAGNOSIS — K219 Gastro-esophageal reflux disease without esophagitis: Secondary | ICD-10-CM | POA: Diagnosis not present

## 2014-11-29 LAB — CBC
HEMATOCRIT: 42 % (ref 39.0–52.0)
Hemoglobin: 14.2 g/dL (ref 13.0–17.0)
MCH: 30.6 pg (ref 26.0–34.0)
MCHC: 33.8 g/dL (ref 30.0–36.0)
MCV: 90.5 fL (ref 78.0–100.0)
Platelets: 370 10*3/uL (ref 150–400)
RBC: 4.64 MIL/uL (ref 4.22–5.81)
RDW: 13.4 % (ref 11.5–15.5)
WBC: 12.5 10*3/uL — AB (ref 4.0–10.5)

## 2014-11-29 LAB — BRAIN NATRIURETIC PEPTIDE: B Natriuretic Peptide: 239.6 pg/mL — ABNORMAL HIGH (ref 0.0–100.0)

## 2014-11-29 LAB — I-STAT TROPONIN, ED: Troponin i, poc: 0.01 ng/mL (ref 0.00–0.08)

## 2014-11-29 LAB — BASIC METABOLIC PANEL
Anion gap: 10 (ref 5–15)
BUN: 19 mg/dL (ref 6–23)
CO2: 22 mmol/L (ref 19–32)
Calcium: 9.6 mg/dL (ref 8.4–10.5)
Chloride: 105 mmol/L (ref 96–112)
Creatinine, Ser: 1.04 mg/dL (ref 0.50–1.35)
GFR calc Af Amer: >90 mL/min (ref >90)
GFR calc non Af Amer: 79 mL/min — ABNORMAL LOW (ref >90)
Glucose, Bld: 127 mg/dL — ABNORMAL HIGH (ref 70–99)
Potassium: 4.3 mmol/L (ref 3.5–5.1)
Sodium: 137 mmol/L (ref 135–145)

## 2014-11-29 MED ORDER — FENTANYL CITRATE 0.05 MG/ML IJ SOLN
50.0000 ug | Freq: Once | INTRAMUSCULAR | Status: AC
  Administered 2014-11-29: 50 ug via INTRAVENOUS
  Filled 2014-11-29: qty 2

## 2014-11-29 MED ORDER — OXYCODONE-ACETAMINOPHEN 5-325 MG PO TABS
1.0000 | ORAL_TABLET | ORAL | Status: DC | PRN
Start: 2014-11-29 — End: 2015-11-12

## 2014-11-29 MED ORDER — OXYCODONE-ACETAMINOPHEN 5-325 MG PO TABS
1.0000 | ORAL_TABLET | Freq: Once | ORAL | Status: AC
  Administered 2014-11-29: 1 via ORAL
  Filled 2014-11-29: qty 1

## 2014-11-29 NOTE — ED Notes (Signed)
Pt from home c/o central chest pain since last pm. He reports pain is sharp. Shortness of breath present

## 2014-11-29 NOTE — ED Notes (Addendum)
Pt reports right/central chest pain with deep breath starting at 1500 today. Wife continues to reports pt fell 3 weeks ago and pain present in same area but subsided until today.

## 2014-11-29 NOTE — Discharge Instructions (Signed)

## 2014-11-29 NOTE — ED Provider Notes (Signed)
CSN: [id number]     Arrival date & time 11/29/14  1726 History   First MD Initiated Contact with Patient 11/29/14 1805     Chief Complaint  Patient presents with  . Chest Pain     (Consider location/radiation/quality/duration/timing/severity/associated sxs/prior Treatment) Patient is a 54 y.o. male presenting with chest pain. The history is provided by the patient.  Chest Pain Associated symptoms: shortness of breath   Associated symptoms: no back pain, no fatigue, no fever and no headache    patient with chest pain. Began acutely this afternoon. He had a fall around 3 weeks ago and landed on his right chest. States today he coughed and developed acute pain in his right chest. Worse with movement. Some shortness of breath. He is worried is having a heart attack.  Past Medical History  Diagnosis Date  . Hypertension   . Hypercholesteremia   . Baker's cyst of knee   . Type II diabetes mellitus   . GERD (gastroesophageal reflux disease)   . Gouty arthritis     "real bad" (01/17/2013)   Past Surgical History  Procedure Laterality Date  . Finger amputation Right 1980's    3rd & 4th digits "got them mashed" (01/17/2013)  . Knee arthroscopy Right 1980's   Family History  Problem Relation Age of Onset  . Diabetes Father   . Hypertension Mother   . Hypertension Father   . Hypertension Sister   . Hypertension Brother   . Hypertension Brother   . Hypertension Brother    History  Substance Use Topics  . Smoking status: Never Smoker   . Smokeless tobacco: Never Used  . Alcohol Use: 1.8 oz/week    3 Shots of liquor per week     Comment: 01/17/2013 "2-3 shots of liquor maybe once/wk; I've quit"    Review of Systems  Constitutional: Negative for fever, appetite change and fatigue.  Respiratory: Positive for shortness of breath. Negative for chest tightness.   Cardiovascular: Positive for chest pain.  Genitourinary: Negative for flank pain.  Musculoskeletal: Negative for back  pain.  Neurological: Negative for headaches.  Hematological: Negative for adenopathy.  Psychiatric/Behavioral: Negative for behavioral problems.      Allergies  Other and Hydrocodone-acetaminophen  Home Medications   Prior to Admission medications   Medication Sig Start Date End Date Taking? Authorizing Provider  acetaminophen (TYLENOL) 325 MG tablet Take 650 mg by mouth every 6 (six) hours as needed (for pain.).   Yes Historical Provider, MD  allopurinol (ZYLOPRIM) 300 MG tablet Take 1 tablet (300 mg total) by mouth daily. 03/26/14  Yes Adrian Blackwater Baker, PA-C  atenolol (TENORMIN) 100 MG tablet Take 100 mg by mouth 2 (two) times daily.   Yes Historical Provider, MD  colchicine 0.6 MG tablet Take 0.5-1 tablets (0.3-0.6 mg total) by mouth 2 (two) times daily as needed. For gout 03/26/14  Yes Graylon Good, PA-C  colchicine 0.6 MG tablet Take 1 tablet (0.6 mg total) by mouth 2 (two) times daily. For gout pain 06/26/14  Yes Marisa Severin, MD  esomeprazole (NEXIUM) 40 MG capsule Take 40 mg by mouth daily as needed (for acid reflux).   Yes Historical Provider, MD  hydrochlorothiazide (HYDRODIURIL) 25 MG tablet Take 1 tablet (25 mg total) by mouth daily. 03/26/14  Yes Graylon Good, PA-C  ibuprofen (ADVIL,MOTRIN) 200 MG tablet Take 200 mg by mouth every 6 (six) hours as needed (pain.).   Yes Historical Provider, MD  indomethacin (INDOCIN) 25 MG capsule Take  1 capsule (25 mg total) by mouth 3 (three) times daily as needed. 06/26/14  Yes Marisa Severin, MD  lisinopril (PRINIVIL,ZESTRIL) 40 MG tablet Take 1 tablet (40 mg total) by mouth daily. 03/26/14  Yes Graylon Good, PA-C  metFORMIN (GLUCOPHAGE) 500 MG tablet Take 1 tablet (500 mg total) by mouth 2 (two) times daily with a meal. 03/26/14  Yes Graylon Good, PA-C  naproxen sodium (ANAPROX) 220 MG tablet Take 440 mg by mouth once as needed (pain.).   Yes Historical Provider, MD  oxyCODONE-acetaminophen (PERCOCET/ROXICET) 5-325 MG per tablet Take 1  tablet by mouth every 4 (four) hours as needed for severe pain. 11/29/14   Benjiman Core, MD  pravastatin (PRAVACHOL) 20 MG tablet Take 1 tablet (20 mg total) by mouth at bedtime. 03/26/14  Yes Adrian Blackwater Baker, PA-C  predniSONE (DELTASONE) 20 MG tablet Take 3 tablets (60 mg total) by mouth daily. 06/26/14   Marisa Severin, MD   BP 165/84 mmHg  Pulse 93  Temp(Src) 99.3 F (37.4 C) (Oral)  Resp 18  Ht  (1.676 m)  Wt 230 lb (104.327 kg)  BMI 37.14 kg/m2  SpO2 96% Physical Exam  Constitutional: He appears well-developed.  HENT:  Head: Atraumatic.  Neck: Neck supple.  Cardiovascular: Normal rate and regular rhythm.   Pulmonary/Chest: Effort normal. He exhibits tenderness.  Tenderness to right anterior lateral chest wall. No subcutaneous emphysema. No crepitance.  Abdominal: Soft. There is no tenderness.  Musculoskeletal: Normal range of motion.  Neurological: He is alert.  Skin: Skin is warm.    ED Course  Procedures (including critical care time) Labs Review Labs Reviewed  CBC - Abnormal; Notable for the following:    WBC 12.5 (*)    All other components within normal limits  BASIC METABOLIC PANEL - Abnormal; Notable for the following:    Glucose, Bld 127 (*)    GFR calc non Af Amer 79 (*)    All other components within normal limits  BRAIN NATRIURETIC PEPTIDE - Abnormal; Notable for the following:    B Natriuretic Peptide 239.6 (*)    All other components within normal limits  I-STAT TROPOININ, ED    Imaging Review Dg Ribs Unilateral W/chest Right  11/29/2014   CLINICAL DATA:  Chest pain and shortness of breath for 3 weeks. Fell 3 weeks ago. Cough for 1 week. Anterior chest pain since fall.  EXAM: RIGHT RIBS AND CHEST - 3+ VIEW  COMPARISON:  Chest x-ray 12/10/2008  FINDINGS: Heart is upper limits normal in size. Mediastinal contours are within normal limits. Low lung volumes with bibasilar atelectasis. No effusions. No acute bony abnormality. No visible rib fracture. No  pneumothorax.  IMPRESSION: Bibasilar atelectasis.  No visible rib fracture.   Electronically Signed   By: Charlett Nose M.D.   On: 11/29/2014 19:10     EKG Interpretation   Date/Time:  Friday November 29 2014 17:29:06 EDT Ventricular Rate:  98 PR Interval:  148 QRS Duration: 81 QT Interval:  367 QTC Calculation: 469 R Axis:   46 Text Interpretation:  Sinus rhythm Borderline T abnormalities, lateral  leads ST elevation, consider inferior injury Confirmed by Rubin Payor  MD,  Elonzo Sopp [phone]) on 11/29/2014 6:06:19 PM      MDM   Final diagnoses:  Chest pain, unspecified chest pain type    Patient with chest pain. Does have some EKG changes, but began on the same side of the chest were had some trauma earlier. Reproducible pain. Doubt pulmonary embolism.  Troponin negative. Chest x-ray does not show fracture. Will discharge home.    Benjiman Core, MD 11/29/14 2038

## 2015-02-24 ENCOUNTER — Encounter: Payer: Self-pay | Admitting: Internal Medicine

## 2015-02-24 ENCOUNTER — Ambulatory Visit: Payer: Commercial Managed Care - HMO | Attending: Internal Medicine | Admitting: Internal Medicine

## 2015-02-24 VITALS — BP 152/98 | HR 89 | Temp 98.3°F | Resp 18 | Ht 66.0 in | Wt 222.6 lb

## 2015-02-24 DIAGNOSIS — R011 Cardiac murmur, unspecified: Secondary | ICD-10-CM | POA: Diagnosis not present

## 2015-02-24 DIAGNOSIS — E785 Hyperlipidemia, unspecified: Secondary | ICD-10-CM | POA: Insufficient documentation

## 2015-02-24 DIAGNOSIS — M1000 Idiopathic gout, unspecified site: Secondary | ICD-10-CM

## 2015-02-24 DIAGNOSIS — E119 Type 2 diabetes mellitus without complications: Secondary | ICD-10-CM

## 2015-02-24 DIAGNOSIS — I10 Essential (primary) hypertension: Secondary | ICD-10-CM | POA: Insufficient documentation

## 2015-02-24 LAB — COMPLETE METABOLIC PANEL WITH GFR
ALT: 9 U/L (ref 0–53)
AST: 22 U/L (ref 0–37)
Albumin: 3.7 g/dL (ref 3.5–5.2)
Alkaline Phosphatase: 80 U/L (ref 39–117)
BILIRUBIN TOTAL: 0.2 mg/dL (ref 0.2–1.2)
BUN: 17 mg/dL (ref 6–23)
CO2: 23 mEq/L (ref 19–32)
CREATININE: 1.05 mg/dL (ref 0.50–1.35)
Calcium: 9.2 mg/dL (ref 8.4–10.5)
Chloride: 106 mEq/L (ref 96–112)
GFR, Est African American: >89 mL/min
GFR, Est Non African American: 80 mL/min
Glucose, Bld: 86 mg/dL (ref 70–99)
Potassium: 4.4 mEq/L (ref 3.5–5.3)
Sodium: 139 mEq/L (ref 135–145)
Total Protein: 6.7 g/dL (ref 6.0–8.3)

## 2015-02-24 LAB — CBC WITH DIFFERENTIAL/PLATELET
BASOS PCT: 1 % (ref 0–1)
Basophils Absolute: 0.1 10*3/uL (ref 0.0–0.1)
Eosinophils Absolute: 0.1 10*3/uL (ref 0.0–0.7)
Eosinophils Relative: 2 % (ref 0–5)
HCT: 44.6 % (ref 39.0–52.0)
Hemoglobin: 14.6 g/dL (ref 13.0–17.0)
Lymphocytes Relative: 46 % (ref 12–46)
Lymphs Abs: 2.9 10*3/uL (ref 0.7–4.0)
MCH: 28.9 pg (ref 26.0–34.0)
MCHC: 32.7 g/dL (ref 30.0–36.0)
MCV: 88.3 fL (ref 78.0–100.0)
MONO ABS: 0.6 10*3/uL (ref 0.1–1.0)
MPV: 9.3 fL (ref 8.6–12.4)
Monocytes Relative: 10 % (ref 3–12)
NEUTROS ABS: 2.5 10*3/uL (ref 1.7–7.7)
Neutrophils Relative %: 41 % — ABNORMAL LOW (ref 43–77)
PLATELETS: 332 10*3/uL (ref 150–400)
RBC: 5.05 MIL/uL (ref 4.22–5.81)
RDW: 14.9 % (ref 11.5–15.5)
WBC: 6.2 10*3/uL (ref 4.0–10.5)

## 2015-02-24 LAB — LIPID PANEL
CHOLESTEROL: 234 mg/dL — AB (ref 0–200)
HDL: 39 mg/dL — ABNORMAL LOW (ref >=40)
LDL Cholesterol: 147 mg/dL — ABNORMAL HIGH (ref 0–99)
TRIGLYCERIDES: 242 mg/dL — AB (ref <150)
Total CHOL/HDL Ratio: 6 Ratio
VLDL: 48 mg/dL — ABNORMAL HIGH (ref 0–40)

## 2015-02-24 LAB — POCT GLYCOSYLATED HEMOGLOBIN (HGB A1C): Hemoglobin A1C: 6.2

## 2015-02-24 LAB — GLUCOSE, POCT (MANUAL RESULT ENTRY): POC Glucose: 109 mg/dl — AB (ref 70–99)

## 2015-02-24 MED ORDER — METFORMIN HCL 500 MG PO TABS: 500.0000 mg | ORAL_TABLET | Freq: Two times a day (BID) | ORAL | Status: DC

## 2015-02-24 MED ORDER — ATENOLOL 100 MG PO TABS: 100.0000 mg | ORAL_TABLET | Freq: Every day | ORAL | Status: DC

## 2015-02-24 MED ORDER — COLCHICINE 0.6 MG PO TABS: 0.6000 mg | ORAL_TABLET | Freq: Two times a day (BID) | ORAL | Status: DC

## 2015-02-24 MED ORDER — SILDENAFIL CITRATE 100 MG PO TABS: 50.0000 mg | ORAL_TABLET | ORAL | Status: DC | PRN

## 2015-02-24 MED ORDER — LISINOPRIL 40 MG PO TABS: 40.0000 mg | ORAL_TABLET | Freq: Every day | ORAL | Status: DC

## 2015-02-24 MED ORDER — ESOMEPRAZOLE MAGNESIUM 40 MG PO CPDR: 40.0000 mg | DELAYED_RELEASE_CAPSULE | Freq: Every day | ORAL | Status: DC

## 2015-02-24 MED ORDER — ALLOPURINOL 300 MG PO TABS: 300.0000 mg | ORAL_TABLET | Freq: Every day | ORAL | Status: DC

## 2015-02-24 MED ORDER — PRAVASTATIN SODIUM 20 MG PO TABS: 20.0000 mg | ORAL_TABLET | Freq: Every day | ORAL | Status: DC

## 2015-02-24 MED ORDER — HYDROCHLOROTHIAZIDE 25 MG PO TABS: 25.0000 mg | ORAL_TABLET | Freq: Every day | ORAL | Status: DC

## 2015-02-24 NOTE — Progress Notes (Signed)
Patient ID: [name], male   DOB: [birth year], 54 y.o.   MRN: [id number]   [name], is a 54 y.o. male  [id number]  [id number]  DOB - 09-27-1960  Chief Complaint  Patient presents with  . Follow-up        Subjective:   [name] is a 54 y.o. male here today for a follow up visit. Patient has been lost to follow-up since 2014, he has history of hypertension, dyslipidemia, type 2 diabetes mellitus on metformin, GERD and gouty arthritis. He has no new complaints today. He would like a prescription for erectile dysfunction that he thinks he has suffered over the last 2 years, he is able to achieve erection but could not sustain it. He also agrees that he has gained some weight and he is planning to engage in some regular exercise regimen. Patient has been out of all his medications for almost a year. He is presently on disability. He smokes marijuana but no cigarette, drinks alcohol occasionally. She denies any use of any other recreational drugs. Patient was seen in the ED around March 2016 for chest pain, MI was ruled out. He also said he has been having feelings of fainting spell, although one occasionally fell because he thought his legs may have given out on him. The last episode was in March but he did not go to ED. He has no palpitation, no shortness of breath. He does not have any personal history of heart disease. His last colonoscopy was 2004. Patient has No headache, No chest pain, No abdominal pain - No Nausea, No new weakness tingling or numbness, No Cough - SOB.  Problem  Essential Hypertension  Heart Murmur  Dyslipidemia    ALLERGIES: Allergies  Allergen Reactions  . Other Anaphylaxis    mushrooms  . Hydrocodone-Acetaminophen Itching    PAST MEDICAL HISTORY: Past Medical History  Diagnosis Date  . Hypertension   . Hypercholesteremia   . Baker's cyst of knee   . Type II diabetes mellitus   . GERD (gastroesophageal reflux disease)   . Gouty arthritis      "real bad" (01/17/2013)    MEDICATIONS AT HOME: Prior to Admission medications   Medication Sig Start Date End Date Taking? Authorizing Provider  acetaminophen (TYLENOL) 325 MG tablet Take 650 mg by mouth every 6 (six) hours as needed (for pain.).   Yes Historical Provider, MD  esomeprazole (NEXIUM) 40 MG capsule Take 1 capsule (40 mg total) by mouth daily. 02/24/15  Yes Quentin Angst, MD  ibuprofen (ADVIL,MOTRIN) 200 MG tablet Take 200 mg by mouth every 6 (six) hours as needed (pain.).   Yes Historical Provider, MD  indomethacin (INDOCIN) 25 MG capsule Take 1 capsule (25 mg total) by mouth 3 (three) times daily as needed. 06/26/14  Yes Marisa Severin, MD  allopurinol (ZYLOPRIM) 300 MG tablet Take 1 tablet (300 mg total) by mouth daily. 02/24/15   Quentin Angst, MD  atenolol (TENORMIN) 100 MG tablet Take 1 tablet (100 mg total) by mouth daily. 02/24/15   Quentin Angst, MD  colchicine 0.6 MG tablet Take 0.5-1 tablets (0.3-0.6 mg total) by mouth 2 (two) times daily as needed. For gout Patient not taking: Reported on 02/24/2015 03/26/14   Graylon Good, PA-C  colchicine 0.6 MG tablet Take 1 tablet (0.6 mg total) by mouth 2 (two) times daily. For gout pain 02/24/15   Quentin Angst, MD  hydrochlorothiazide (HYDRODIURIL) 25 MG tablet Take 1 tablet (25 mg total)  by mouth daily. 02/24/15   Quentin Angst, MD  lisinopril (PRINIVIL,ZESTRIL) 40 MG tablet Take 1 tablet (40 mg total) by mouth daily. 02/24/15   Quentin Angst, MD  metFORMIN (GLUCOPHAGE) 500 MG tablet Take 1 tablet (500 mg total) by mouth 2 (two) times daily with a meal. 02/24/15   Quentin Angst, MD  naproxen sodium (ANAPROX) 220 MG tablet Take 440 mg by mouth once as needed (pain.).    Historical Provider, MD  oxyCODONE-acetaminophen (PERCOCET/ROXICET) 5-325 MG per tablet Take 1 tablet by mouth every 4 (four) hours as needed for severe pain. Patient not taking: Reported on 02/24/2015 11/29/14   Benjiman Core,  MD  pravastatin (PRAVACHOL) 20 MG tablet Take 1 tablet (20 mg total) by mouth at bedtime. 02/24/15   Quentin Angst, MD  predniSONE (DELTASONE) 20 MG tablet Take 3 tablets (60 mg total) by mouth daily. Patient not taking: Reported on 02/24/2015 06/26/14   Marisa Severin, MD  sildenafil (VIAGRA) 100 MG tablet Take 0.5 tablets (50 mg total) by mouth as needed for erectile dysfunction. 02/24/15   Quentin Angst, MD     Objective:   Filed Vitals:   02/24/15 1501  BP: 152/98  Pulse: 89  Temp: 98.3 F (36.8 C)  TempSrc: Oral  Resp: 18  Height:  (1.676 m)  Weight: 222 lb 9.6 oz (100.971 kg)  SpO2: 97%    Exam General appearance : Awake, alert, not in any distress. Speech Clear. Not toxic looking HEENT: Atraumatic and Normocephalic, pupils equally reactive to light and accomodation Neck: supple, no JVD. No cervical lymphadenopathy.  Chest:Good air entry bilaterally, no added sounds  CVS: S1 S2 regular, ++ grade 3 - 4 systolic murmur.  Abdomen: Bowel sounds present, Non tender and not distended with no gaurding, rigidity or rebound. Extremities: B/L Lower Ext shows no edema, both legs are warm to touch Neurology: Awake alert, and oriented X 3, CN II-XII intact, Non focal Skin:No Rash  Data Review Lab Results  Component Value Date   HGBA1C 6.2% 02/24/2015   HGBA1C 5.4 05/02/2013   HGBA1C 5.6 01/17/2013     Assessment & Plan   1. Type 2 diabetes mellitus without complications  - Glucose (CBG) - HgB A1c - metFORMIN (GLUCOPHAGE) 500 MG tablet; Take 1 tablet (500 mg total) by mouth 2 (two) times daily with a meal.  Dispense: 60 tablet; Refill: 5  Aim for 2-3 Carb Choices per meal (30-45 grams) +/- 1 either way  Aim for 0-15 Carbs per snack if hungry  Include protein in moderation with your meals and snacks  Consider reading food labels for Total Carbohydrate and Fat Grams of foods  Consider checking BG at alternate times per day  Continue taking medication as  directed Fruit Punch - find one with no sugar  Measure and decrease portions of carbohydrate foods  Make your plate and don't go back for seconds   2. Essential hypertension  - lisinopril (PRINIVIL,ZESTRIL) 40 MG tablet; Take 1 tablet (40 mg total) by mouth daily.  Dispense: 30 tablet; Refill: 5 - hydrochlorothiazide (HYDRODIURIL) 25 MG tablet; Take 1 tablet (25 mg total) by mouth daily.  Dispense: 30 tablet; Refill: 5 - atenolol (TENORMIN) 100 MG tablet; Take 1 tablet (100 mg total) by mouth daily.  Dispense: 30 tablet; Refill: 5  - CBC with Differential/Platelet - COMPLETE METABOLIC PANEL WITH GFR - Urinalysis, Complete  - We have discussed target BP range and blood pressure goal - I have advised  patient to check BP regularly and to call us back or report to clinic if the numbers are consistently higher than 140/90  - We discussed the importance of compliance with medical therapy and DASH diet recommended, consequences of uncontrolled hypertension discussed.  - continue current BP medications  3. Idiopathic gout, unspecified chronicity, unspecified site  - esomeprazole (NEXIUM) 40 MG capsule; Take 1 capsule (40 mg total) by mouth daily.  Dispense: 30 capsule; Refill: 5 - colchicine 0.6 MG tablet; Take 1 tablet (0.6 mg total) by mouth 2 (two) times daily. For gout pain  Dispense: 30 tablet; Refill: 5 - allopurinol (ZYLOPRIM) 300 MG tablet; Take 1 tablet (300 mg total) by mouth daily.  Dispense: 30 tablet; Refill: 6  4. Heart murmur  - Echocardiogram; Future  5. Dyslipidemia  - pravastatin (PRAVACHOL) 20 MG tablet; Take 1 tablet (20 mg total) by mouth at bedtime.  Dispense: 30 tablet; Refill: 5 - Lipid panel To address this please limit saturated fat to no more than 7% of your calories, limit cholesterol to 200 mg/day, increase fiber and exercise as tolerated. If needed we may add another cholesterol lowering medication to your regimen.   Patient have been counseled extensively  about nutrition and exercise Return in about 3 months (around 05/27/2015), or if symptoms worsen or fail to improve, for Follow up HTN, Hemoglobin A1C and Follow up, DM, Annual Physical.  The patient was given clear instructions to go to ER or return to medical center if symptoms don't improve, worsen or new problems develop. The patient verbalized understanding. The patient was told to call to get lab results if they haven't heard anything in the next week.   This note has been created with Education officer, environmental. Any transcriptional errors are unintentional.    Jeanann Lewandowsky, MD, MHA, CPE, FACP, FAAP [facility] [place], [place] [phone]   02/24/2015, 3:55 PM

## 2015-02-24 NOTE — Progress Notes (Signed)
Patient is here for follow up for HTN and DM. Patient reports feeling fine and denies any pain today. Patient would like to discuss something private with the doctor.   Patient states he needs refills on all medications. Patient has been out of his BP and DM medications for over a month. Patient CBG is 109 and BP is 152/98 checked manually.

## 2015-02-24 NOTE — Patient Instructions (Signed)
Diabetes and Exercise Exercising regularly is important. It is not just about losing weight. It has many health benefits, such as:  Improving your overall fitness, flexibility, and endurance.  Increasing your bone density.  Helping with weight control.  Decreasing your body fat.  Increasing your muscle strength.  Reducing stress and tension.  Improving your overall health. People with diabetes who exercise gain additional benefits because exercise:  Reduces appetite.  Improves the body's use of blood sugar (glucose).  Helps lower or control blood glucose.  Decreases blood pressure.  Helps control blood lipids (such as cholesterol and triglycerides).  Improves the body's use of the hormone insulin by:  Increasing the body's insulin sensitivity.  Reducing the body's insulin needs.  Decreases the risk for heart disease because exercising:  Lowers cholesterol and triglycerides levels.  Increases the levels of good cholesterol (such as high-density lipoproteins [HDL]) in the body.  Lowers blood glucose levels. YOUR ACTIVITY PLAN  Choose an activity that you enjoy and set realistic goals. Your health care provider or diabetes educator can help you make an activity plan that works for you. Exercise regularly as directed by your health care provider. This includes:  Performing resistance training twice a week such as push-ups, sit-ups, lifting weights, or using resistance bands.  Performing 150 minutes of cardio exercises each week such as walking, running, or playing sports.  Staying active and spending no more than 90 minutes at one time being inactive. Even short bursts of exercise are good for you. Three 10-minute sessions spread throughout the day are just as beneficial as a single 30-minute session. Some exercise ideas include:  Taking the dog for a walk.  Taking the stairs instead of the elevator.  Dancing to your favorite song.  Doing an exercise  video.  Doing your favorite exercise with a friend. RECOMMENDATIONS FOR EXERCISING WITH TYPE 1 OR TYPE 2 DIABETES   Check your blood glucose before exercising. If blood glucose levels are greater than 240 mg/dL, check for urine ketones. Do not exercise if ketones are present.  Avoid injecting insulin into areas of the body that are going to be exercised. For example, avoid injecting insulin into:  The arms when playing tennis.  The legs when jogging.  Keep a record of:  Food intake before and after you exercise.  Expected peak times of insulin action.  Blood glucose levels before and after you exercise.  The type and amount of exercise you have done.  Review your records with your health care provider. Your health care provider will help you to develop guidelines for adjusting food intake and insulin amounts before and after exercising.  If you take insulin or oral hypoglycemic agents, watch for signs and symptoms of hypoglycemia. They include:  Dizziness.  Shaking.  Sweating.  Chills.  Confusion.  Drink plenty of water while you exercise to prevent dehydration or heat stroke. Body water is lost during exercise and must be replaced.  Talk to your health care provider before starting an exercise program to make sure it is safe for you. Remember, almost any type of activity is better than none. Document Released: 11/13/2003 Document Revised: 01/07/2014 Document Reviewed: 01/30/2013 [employer] Patient Information 2015 [employer]. This information is not intended to replace advice given to you by your health care provider. Make sure you discuss any questions you have with your health care provider. DASH Eating Plan DASH stands for "Dietary Approaches to Stop Hypertension." The DASH eating plan is a healthy eating plan that   has been shown to reduce high blood pressure (hypertension). Additional health benefits may include reducing the risk of type 2 diabetes mellitus, heart  disease, and stroke. The DASH eating plan may also help with weight loss. WHAT DO I NEED TO KNOW ABOUT THE DASH EATING PLAN? For the DASH eating plan, you will follow these general guidelines:  Choose foods with a percent daily value for sodium of less than 5% (as listed on the food label).  Use salt-free seasonings or herbs instead of table salt or sea salt.  Check with your health care provider or pharmacist before using salt substitutes.  Eat lower-sodium products, often labeled as "lower sodium" or "no salt added."  Eat fresh foods.  Eat more vegetables, fruits, and low-fat dairy products.  Choose whole grains. Look for the word "whole" as the first word in the ingredient list.  Choose fish and skinless chicken or turkey more often than red meat. Limit fish, poultry, and meat to 6 oz (170 g) each day.  Limit sweets, desserts, sugars, and sugary drinks.  Choose heart-healthy fats.  Limit cheese to 1 oz (28 g) per day.  Eat more home-cooked food and less restaurant, buffet, and fast food.  Limit fried foods.  Cook foods using methods other than frying.  Limit canned vegetables. If you do use them, rinse them well to decrease the sodium.  When eating at a restaurant, ask that your food be prepared with less salt, or no salt if possible. WHAT FOODS CAN I EAT? Seek help from a dietitian for individual calorie needs. Grains Whole grain or whole wheat bread. Brown rice. Whole grain or whole wheat pasta. Quinoa, bulgur, and whole grain cereals. Low-sodium cereals. Corn or whole wheat flour tortillas. Whole grain cornbread. Whole grain crackers. Low-sodium crackers. Vegetables Fresh or frozen vegetables (raw, steamed, roasted, or grilled). Low-sodium or reduced-sodium tomato and vegetable juices. Low-sodium or reduced-sodium tomato sauce and paste. Low-sodium or reduced-sodium canned vegetables.  Fruits All fresh, canned (in natural juice), or frozen fruits. Meat and Other  Protein Products Ground beef (85% or leaner), grass-fed beef, or beef trimmed of fat. Skinless chicken or turkey. Ground chicken or turkey. Pork trimmed of fat. All fish and seafood. Eggs. Dried beans, peas, or lentils. Unsalted nuts and seeds. Unsalted canned beans. Dairy Low-fat dairy products, such as skim or 1% milk, 2% or reduced-fat cheeses, low-fat ricotta or cottage cheese, or plain low-fat yogurt. Low-sodium or reduced-sodium cheeses. Fats and Oils Tub margarines without trans fats. Light or reduced-fat mayonnaise and salad dressings (reduced sodium). Avocado. Safflower, olive, or canola oils. Natural peanut or almond butter. Other Unsalted popcorn and pretzels. The items listed above may not be a complete list of recommended foods or beverages. Contact your dietitian for more options. WHAT FOODS ARE NOT RECOMMENDED? Grains White bread. White pasta. White rice. Refined cornbread. Bagels and croissants. Crackers that contain trans fat. Vegetables Creamed or fried vegetables. Vegetables in a cheese sauce. Regular canned vegetables. Regular canned tomato sauce and paste. Regular tomato and vegetable juices. Fruits Dried fruits. Canned fruit in light or heavy syrup. Fruit juice. Meat and Other Protein Products Fatty cuts of meat. Ribs, chicken wings, bacon, sausage, bologna, salami, chitterlings, fatback, hot dogs, bratwurst, and packaged luncheon meats. Salted nuts and seeds. Canned beans with salt. Dairy Whole or 2% milk, cream, half-and-half, and cream cheese. Whole-fat or sweetened yogurt. Full-fat cheeses or blue cheese. Nondairy creamers and whipped toppings. Processed cheese, cheese spreads, or cheese curds. Condiments Onion and garlic   salt, seasoned salt, table salt, and sea salt. Canned and packaged gravies. Worcestershire sauce. Tartar sauce. Barbecue sauce. Teriyaki sauce. Soy sauce, including reduced sodium. Steak sauce. Fish sauce. Oyster sauce. Cocktail sauce. Horseradish.  Ketchup and mustard. Meat flavorings and tenderizers. Bouillon cubes. Hot sauce. Tabasco sauce. Marinades. Taco seasonings. Relishes. Fats and Oils Butter, stick margarine, lard, shortening, ghee, and bacon fat. Coconut, palm kernel, or palm oils. Regular salad dressings. Other Pickles and olives. Salted popcorn and pretzels. The items listed above may not be a complete list of foods and beverages to avoid. Contact your dietitian for more information. WHERE CAN I FIND MORE INFORMATION? National Heart, Lung, and [employer] Institute: [employer] Document Released: 08/12/2011 Document Revised: 01/07/2014 Document Reviewed: 06/27/2013 [facility] Patient Information 2015 [place], [place]. This information is not intended to replace advice given to you by your health care provider. Make sure you discuss any questions you have with your health care provider. Diabetes and Exercise Exercising regularly is important. It is not just about losing weight. It has many health benefits, such as:  Improving your overall fitness, flexibility, and endurance.  Increasing your bone density.  Helping with weight control.  Decreasing your body fat.  Increasing your muscle strength.  Reducing stress and tension.  Improving your overall health. People with diabetes who exercise gain additional benefits because exercise:  Reduces appetite.  Improves the body's use of blood sugar (glucose).  Helps lower or control blood glucose.  Decreases blood pressure.  Helps control blood lipids (such as cholesterol and triglycerides).  Improves the body's use of the hormone insulin by:  Increasing the body's insulin sensitivity.  Reducing the body's insulin needs.  Decreases the risk for heart disease because exercising:  Lowers cholesterol and triglycerides levels.  Increases the levels of good cholesterol (such as high-density lipoproteins [HDL]) in the body.  Lowers  blood glucose levels. YOUR ACTIVITY PLAN  Choose an activity that you enjoy and set realistic goals. Your health care provider or diabetes educator can help you make an activity plan that works for you. Exercise regularly as directed by your health care provider. This includes:  Performing resistance training twice a week such as push-ups, sit-ups, lifting weights, or using resistance bands.  Performing 150 minutes of cardio exercises each week such as walking, running, or playing sports.  Staying active and spending no more than 90 minutes at one time being inactive. Even short bursts of exercise are good for you. Three 10-minute sessions spread throughout the day are just as beneficial as a single 30-minute session. Some exercise ideas include:  Taking the dog for a walk.  Taking the stairs instead of the elevator.  Dancing to your favorite song.  Doing an exercise video.  Doing your favorite exercise with a friend. RECOMMENDATIONS FOR EXERCISING WITH TYPE 1 OR TYPE 2 DIABETES   Check your blood glucose before exercising. If blood glucose levels are greater than 240 mg/dL, check for urine ketones. Do not exercise if ketones are present.  Avoid injecting insulin into areas of the body that are going to be exercised. For example, avoid injecting insulin into:  The arms when playing tennis.  The legs when jogging.  Keep a record of:  Food intake before and after you exercise.  Expected peak times of insulin action.  Blood glucose levels before and after you exercise.  The type and amount of exercise you have done.  Review your records with your health care provider. Your health care provider will help you to  develop guidelines for adjusting food intake and insulin amounts before and after exercising.  If you take insulin or oral hypoglycemic agents, watch for signs and symptoms of hypoglycemia. They  include:  Dizziness.  Shaking.  Sweating.  Chills.  Confusion.  Drink plenty of water while you exercise to prevent dehydration or heat stroke. Body water is lost during exercise and must be replaced.  Talk to your health care provider before starting an exercise program to make sure it is safe for you. Remember, almost any type of activity is better than none. Document Released: 11/13/2003 Document Revised: 01/07/2014 Document Reviewed: 01/30/2013 [facility] Patient Information 2015 [place], [place]. This information is not intended to replace advice given to you by your health care provider. Make sure you discuss any questions you have with your health care provider. Hypertension Hypertension, commonly called high blood pressure, is when the force of blood pumping through your arteries is too strong. Your arteries are the blood vessels that carry blood from your heart throughout your body. A blood pressure reading consists of a higher number over a lower number, such as 110/72. The higher number (systolic) is the pressure inside your arteries when your heart pumps. The lower number (diastolic) is the pressure inside your arteries when your heart relaxes. Ideally you want your blood pressure below 120/80. Hypertension forces your heart to work harder to pump blood. Your arteries may become narrow or stiff. Having hypertension puts you at risk for heart disease, stroke, and other problems.  RISK FACTORS Some risk factors for high blood pressure are controllable. Others are not.  Risk factors you cannot control include:   Race. You may be at higher risk if you are African American.  Age. Risk increases with age.  Gender. Men are at higher risk than women before age 64 years. After age 7, women are at higher risk than men. Risk factors you can control include:  Not getting enough exercise or physical activity.  Being overweight.  Getting too much fat, sugar, calories, or salt in your  diet.  Drinking too much alcohol. SIGNS AND SYMPTOMS Hypertension does not usually cause signs or symptoms. Extremely high blood pressure (hypertensive crisis) may cause headache, anxiety, shortness of breath, and nosebleed. DIAGNOSIS  To check if you have hypertension, your health care provider will measure your blood pressure while you are seated, with your arm held at the level of your heart. It should be measured at least twice using the same arm. Certain conditions can cause a difference in blood pressure between your right and left arms. A blood pressure reading that is higher than normal on one occasion does not mean that you need treatment. If one blood pressure reading is high, ask your health care provider about having it checked again. TREATMENT  Treating high blood pressure includes making lifestyle changes and possibly taking medicine. Living a healthy lifestyle can help lower high blood pressure. You may need to change some of your habits. Lifestyle changes may include:  Following the DASH diet. This diet is high in fruits, vegetables, and whole grains. It is low in salt, red meat, and added sugars.  Getting at least 2 hours of brisk physical activity every week.  Losing weight if necessary.  Not smoking.  Limiting alcoholic beverages.  Learning ways to reduce stress. If lifestyle changes are not enough to get your blood pressure under control, your health care provider may prescribe medicine. You may need to take more than one. Work closely with your  health care provider to understand the risks and benefits. HOME CARE INSTRUCTIONS  Have your blood pressure rechecked as directed by your health care provider.   Take medicines only as directed by your health care provider. Follow the directions carefully. Blood pressure medicines must be taken as prescribed. The medicine does not work as well when you skip doses. Skipping doses also puts you at risk for problems.   Do not  smoke.   Monitor your blood pressure at home as directed by your health care provider. SEEK MEDICAL CARE IF:   You think you are having a reaction to medicines taken.  You have recurrent headaches or feel dizzy.  You have swelling in your ankles.  You have trouble with your vision. SEEK IMMEDIATE MEDICAL CARE IF:  You develop a severe headache or confusion.  You have unusual weakness, numbness, or feel faint.  You have severe chest or abdominal pain.  You vomit repeatedly.  You have trouble breathing. MAKE SURE YOU:   Understand these instructions.  Will watch your condition.  Will get help right away if you are not doing well or get worse. Document Released: 08/23/2005 Document Revised: 01/07/2014 Document Reviewed: 06/15/2013 [facility] Patient Information 2015 [place], [place]. This information is not intended to replace advice given to you by your health care provider. Make sure you discuss any questions you have with your health care provider.

## 2015-02-25 LAB — URINALYSIS, COMPLETE
BACTERIA UA: NONE SEEN
Bilirubin Urine: NEGATIVE
CASTS: NONE SEEN
CRYSTALS: NONE SEEN
GLUCOSE, UA: NEGATIVE mg/dL
KETONES UR: NEGATIVE mg/dL
Leukocytes, UA: NEGATIVE
Nitrite: NEGATIVE
PH: 5 (ref 5.0–8.0)
Protein, ur: >300 mg/dL — AB
SPECIFIC GRAVITY, URINE: 1.019 (ref 1.005–1.030)
SQUAMOUS EPITHELIAL / LPF: NONE SEEN
Urobilinogen, UA: 0.2 mg/dL (ref 0.0–1.0)

## 2015-03-11 ENCOUNTER — Other Ambulatory Visit (HOSPITAL_COMMUNITY): Payer: Self-pay | Admitting: Internal Medicine

## 2015-03-11 ENCOUNTER — Telehealth: Payer: Self-pay | Admitting: Internal Medicine

## 2015-03-11 DIAGNOSIS — R011 Cardiac murmur, unspecified: Secondary | ICD-10-CM

## 2015-03-11 NOTE — Telephone Encounter (Signed)
[facility] called to have PCP be aware that they have not been able to contact the patient to have his Echo done. Please f/u for any questions.

## 2015-03-11 NOTE — Telephone Encounter (Signed)
03/11/2015 LMOM for pt to call back to schedule appt. stpegram

## 2015-03-11 NOTE — Telephone Encounter (Signed)
Nurse called patient, patient verified date of birth. Nurse informed patient of [facility] trying to call patient about Echo. Patient requests [facility] to call daughter's number [phone] and speak to daughter about Echo. Nurse called [facility] at [phone] to make them aware of above requests by patient.

## 2015-03-14 ENCOUNTER — Telehealth: Payer: Self-pay | Admitting: *Deleted

## 2015-03-14 NOTE — Telephone Encounter (Signed)
Left HIPAA compliant message for patient to return our call. 

## 2015-03-14 NOTE — Telephone Encounter (Signed)
-----   Message from Quentin Angst, MD sent at 02/28/2015  5:36 PM EDT ----- Please inform patient that his laboratory test results shows high cholesterol level, encouraged patient to continue pravastatin as prescribed and also to address this please limit saturated fat to no more than 7% of your calories, limit cholesterol to 200 mg/day, increase fiber and exercise as tolerated. If needed we may add another cholesterol lowering medication to your regimen. Other results are mostly within normal limits.

## 2015-03-19 ENCOUNTER — Other Ambulatory Visit: Payer: Self-pay

## 2015-03-19 ENCOUNTER — Ambulatory Visit (HOSPITAL_COMMUNITY): Payer: Commercial Managed Care - HMO | Attending: Cardiology

## 2015-03-19 DIAGNOSIS — R011 Cardiac murmur, unspecified: Secondary | ICD-10-CM

## 2015-03-19 DIAGNOSIS — I340 Nonrheumatic mitral (valve) insufficiency: Secondary | ICD-10-CM | POA: Insufficient documentation

## 2015-03-19 DIAGNOSIS — I071 Rheumatic tricuspid insufficiency: Secondary | ICD-10-CM | POA: Insufficient documentation

## 2015-03-19 DIAGNOSIS — I351 Nonrheumatic aortic (valve) insufficiency: Secondary | ICD-10-CM | POA: Diagnosis not present

## 2015-04-09 ENCOUNTER — Telehealth: Payer: Self-pay

## 2015-04-09 NOTE — Telephone Encounter (Signed)
-----   Message from Quentin Angst, MD sent at 04/09/2015 12:20 PM EDT ----- Please inform patient that his echocardiogram shows increased right ventricular systolic pressure consistent with moderate pulmonary hypertension. We will refer patient to cardiologist for further evaluation. Please schedule patient with Dr. Daleen Squibb cardiology clinic.

## 2015-04-09 NOTE — Telephone Encounter (Signed)
Nurse called patient, patient verified date of birth. Patient aware of echocardiogram showing increased right ventricular systolic pressure consistent with moderate pulmonary hypertension. Patient agrees to be referred to Dr. Daleen Squibb for cardiology. Phone was disconnected before appointemnt with Dr. Daleen Squibb was made. Nurse called patient back and left message due to no answer.

## 2015-04-10 NOTE — Telephone Encounter (Signed)
-----   Message from Olugbemiga E Jegede, MD sent at 04/09/2015 12:20 PM EDT ----- Please inform patient that his echocardiogram shows increased right ventricular systolic pressure consistent with moderate pulmonary hypertension. We will refer patient to cardiologist for further evaluation. Please schedule patient with Dr. Wall cardiology clinic. 

## 2015-04-14 NOTE — Telephone Encounter (Signed)
Nurse called patient. Patient verified appointment with Dr. Daleen Squibb on Wednesday at 4:15pm.

## 2015-04-16 ENCOUNTER — Encounter: Payer: Self-pay | Admitting: Cardiology

## 2015-04-16 ENCOUNTER — Ambulatory Visit: Payer: Commercial Managed Care - HMO | Attending: Cardiology | Admitting: Cardiology

## 2015-04-16 VITALS — BP 165/95 | HR 88 | Temp 98.3°F | Resp 18 | Ht 65.0 in | Wt 214.0 lb

## 2015-04-16 DIAGNOSIS — I10 Essential (primary) hypertension: Secondary | ICD-10-CM | POA: Insufficient documentation

## 2015-04-16 DIAGNOSIS — Z6835 Body mass index (BMI) 35.0-35.9, adult: Secondary | ICD-10-CM | POA: Diagnosis not present

## 2015-04-16 DIAGNOSIS — E663 Overweight: Secondary | ICD-10-CM | POA: Diagnosis not present

## 2015-04-16 DIAGNOSIS — Z79899 Other long term (current) drug therapy: Secondary | ICD-10-CM | POA: Insufficient documentation

## 2015-04-16 DIAGNOSIS — M1000 Idiopathic gout, unspecified site: Secondary | ICD-10-CM | POA: Insufficient documentation

## 2015-04-16 DIAGNOSIS — Z7952 Long term (current) use of systemic steroids: Secondary | ICD-10-CM | POA: Diagnosis not present

## 2015-04-16 DIAGNOSIS — Q231 Congenital insufficiency of aortic valve: Secondary | ICD-10-CM

## 2015-04-16 DIAGNOSIS — E119 Type 2 diabetes mellitus without complications: Secondary | ICD-10-CM | POA: Insufficient documentation

## 2015-04-16 DIAGNOSIS — N529 Male erectile dysfunction, unspecified: Secondary | ICD-10-CM | POA: Insufficient documentation

## 2015-04-16 DIAGNOSIS — Q2381 Bicuspid aortic valve: Secondary | ICD-10-CM | POA: Insufficient documentation

## 2015-04-16 DIAGNOSIS — R011 Cardiac murmur, unspecified: Secondary | ICD-10-CM | POA: Diagnosis present

## 2015-04-16 DIAGNOSIS — K219 Gastro-esophageal reflux disease without esophagitis: Secondary | ICD-10-CM | POA: Diagnosis not present

## 2015-04-16 DIAGNOSIS — E780 Pure hypercholesterolemia: Secondary | ICD-10-CM | POA: Diagnosis not present

## 2015-04-16 DIAGNOSIS — I272 Other secondary pulmonary hypertension: Secondary | ICD-10-CM | POA: Diagnosis not present

## 2015-04-16 NOTE — Progress Notes (Signed)
Patient had Echo on 03/19/15. Per Dr. Hyman Hopes patient echo results show increased right ventricular systolic pressure consistent with moderate pulmonary hypertension.   Patient reports feeling fine and denies pain at this time.   Patient nervous about echo results.

## 2015-04-16 NOTE — Assessment & Plan Note (Signed)
This is secondary to aortic insufficiency and mild aortic stenosis from a bicuspid aortic valve. This was found on recent echocardiographic study in July. We'll watch with annual echoes. I've advised the patient that he will most likely have to have this valve replaced within the next 5 years. I will see him back in a year.

## 2015-04-16 NOTE — Assessment & Plan Note (Addendum)
I've asked him to take  prophylaxic antibiotic before any significant dental procedure. I will see him back on an annual basis with an annual echocardiogram.

## 2015-04-16 NOTE — Patient Instructions (Signed)
Thank you for coming in to see Dr. Daleen Squibb today. Please follow up with Dr. Daleen Squibb in one year.

## 2015-04-16 NOTE — Progress Notes (Signed)
HPI Mr [name] is a 54 year old married African-American male who comes today referred by Dr.Jegede for the evaluation of a heart murmur. He also had the impression from his echocardiogram. Pulmonary hypertension.  He's had a murmur all of his life. His echocardiogram actually showed a bicuspid aortic valve with no significant stenosis and mild to moderate aortic insufficiency. His left ventricle was normal in size and function. He has mild mitral regurgitation. His left atrium is is enlarged as well. Echocardiogram read a pulmonary systolic pressure of 69 mmHg however his right ventricle right atrium are normal in size and function and he has only mild tricuspid regurgitation.  When questioned, he is totally asymptomatic. He denies any chest pain, shortness of breath, palpitations, presyncope or syncope, and no lower extremity edema. He seems to be compliant with his medications. He is overweight and is trying to lose weight. He does not smoke and has no history of chronic lung disease.  Past Medical History  Diagnosis Date  . Hypertension   . Hypercholesteremia   . Baker's cyst of knee   . Type II diabetes mellitus   . GERD (gastroesophageal reflux disease)   . Gouty arthritis     "real bad" (01/17/2013)    Current Outpatient Prescriptions  Medication Sig Dispense Refill  . acetaminophen (TYLENOL) 325 MG tablet Take 650 mg by mouth every 6 (six) hours as needed (for pain.).    Marland Kitchen allopurinol (ZYLOPRIM) 300 MG tablet Take 1 tablet (300 mg total) by mouth daily. 30 tablet 6  . atenolol (TENORMIN) 100 MG tablet Take 1 tablet (100 mg total) by mouth daily. 30 tablet 5  . colchicine 0.6 MG tablet Take 1 tablet (0.6 mg total) by mouth 2 (two) times daily. For gout pain 30 tablet 5  . esomeprazole (NEXIUM) 40 MG capsule Take 1 capsule (40 mg total) by mouth daily. 30 capsule 5  . hydrochlorothiazide (HYDRODIURIL) 25 MG tablet Take 1 tablet (25 mg total) by mouth daily. 30 tablet 5  .  ibuprofen (ADVIL,MOTRIN) 200 MG tablet Take 200 mg by mouth every 6 (six) hours as needed (pain.).    Marland Kitchen indomethacin (INDOCIN) 25 MG capsule Take 1 capsule (25 mg total) by mouth 3 (three) times daily as needed. 30 capsule 0  . lisinopril (PRINIVIL,ZESTRIL) 40 MG tablet Take 1 tablet (40 mg total) by mouth daily. 30 tablet 5  . metFORMIN (GLUCOPHAGE) 500 MG tablet Take 1 tablet (500 mg total) by mouth 2 (two) times daily with a meal. 60 tablet 5  . naproxen sodium (ANAPROX) 220 MG tablet Take 440 mg by mouth once as needed (pain.).    Marland Kitchen oxyCODONE-acetaminophen (PERCOCET/ROXICET) 5-325 MG per tablet Take 1 tablet by mouth every 4 (four) hours as needed for severe pain. 10 tablet 0  . pravastatin (PRAVACHOL) 20 MG tablet Take 1 tablet (20 mg total) by mouth at bedtime. 30 tablet 5  . colchicine 0.6 MG tablet Take 0.5-1 tablets (0.3-0.6 mg total) by mouth 2 (two) times daily as needed. For gout (Patient not taking: Reported on 04/16/2015) 20 tablet 0  . predniSONE (DELTASONE) 20 MG tablet Take 3 tablets (60 mg total) by mouth daily. (Patient not taking: Reported on 02/24/2015) 15 tablet 0  . sildenafil (VIAGRA) 100 MG tablet Take 0.5 tablets (50 mg total) by mouth as needed for erectile dysfunction. (Patient not taking: Reported on 04/16/2015) 5 tablet 11   No current facility-administered medications for this visit.    Allergies  Allergen Reactions  .  Other Anaphylaxis    mushrooms  . Hydrocodone-Acetaminophen Itching    Family History  Problem Relation Age of Onset  . Diabetes Father   . Hypertension Mother   . Hypertension Father   . Hypertension Sister   . Hypertension Brother   . Hypertension Brother   . Hypertension Brother     Social History   Social History  . Marital Status: Widowed    Spouse Name: N/A  . Number of Children: N/A  . Years of Education: N/A   Occupational History  . Not on file.   Social History Main Topics  . Smoking status: Never Smoker   . Smokeless  tobacco: Never Used  . Alcohol Use: 1.8 oz/week    3 Shots of liquor per week     Comment: ocassional   . Drug Use: Yes    Special: Marijuana     Comment: last weed was 1 week ago. 04/16/15  . Sexual Activity: Yes   Other Topics Concern  . Not on file   Social History Narrative    ROS ALL NEGATIVE EXCEPT THOSE NOTED IN HPI  PE  General Appearance: well developed, well nourished in no acute distress, obese HEENT: symmetrical face, PERRLA, good dentition  Neck: no JVD, thyromegaly, or adenopathy, trachea midline Chest: symmetric without deformity Cardiac: PMI non-displaced, RRR, normal S1, S2, 2/6 systolic murmur along the left upper sternal border and right upper sternal border with a 3/6 diastolic blowing murmur consistent with aortic valve regurgitation. Lung: clear to ausculation and percussion Vascular: all pulses full without bruits  Abdominal: nondistended, nontender, good bowel sounds, no HSM, no bruits Extremities: no cyanosis, clubbing or edema, no sign of DVT, no varicosities  Skin: normal color, no rashes Neuro: alert and oriented x 3, non-focal Pysch: normal affect  EKG Normal sinus rhythm, no evidence of pulmonary hypertension or right ventricular enlargement, nonspecific ST segment changes inferior laterally consistent with LVH BMET    Component Value Date/Time   NA 139 02/24/2015 1601   K 4.4 02/24/2015 1601   CL 106 02/24/2015 1601   CO2 23 02/24/2015 1601   GLUCOSE 86 02/24/2015 1601   BUN 17 02/24/2015 1601   CREATININE 1.05 02/24/2015 1601   CREATININE 1.04 11/29/2014 1739   CALCIUM 9.2 02/24/2015 1601   GFRNONAA 80 02/24/2015 1601   GFRNONAA 79* 11/29/2014 1739   GFRAA >89 02/24/2015 1601   GFRAA >90 11/29/2014 1739    Lipid Panel     Component Value Date/Time   CHOL 234* 02/24/2015 1601   TRIG 242* 02/24/2015 1601   HDL 39* 02/24/2015 1601   CHOLHDL 6.0 02/24/2015 1601   VLDL 48* 02/24/2015 1601   LDLCALC 147* 02/24/2015 1601    CBC     Component Value Date/Time   WBC 6.2 02/24/2015 1601   RBC 5.05 02/24/2015 1601   HGB 14.6 02/24/2015 1601   HCT 44.6 02/24/2015 1601   PLT 332 02/24/2015 1601   MCV 88.3 02/24/2015 1601   MCH 28.9 02/24/2015 1601   MCHC 32.7 02/24/2015 1601   RDW 14.9 02/24/2015 1601   LYMPHSABS 2.9 02/24/2015 1601   MONOABS 0.6 02/24/2015 1601   EOSABS 0.1 02/24/2015 1601   BASOSABS 0.1 02/24/2015 1601

## 2015-05-27 ENCOUNTER — Ambulatory Visit: Payer: Commercial Managed Care - HMO | Attending: Internal Medicine | Admitting: Pharmacist

## 2015-05-27 DIAGNOSIS — I10 Essential (primary) hypertension: Secondary | ICD-10-CM

## 2015-05-27 DIAGNOSIS — E119 Type 2 diabetes mellitus without complications: Secondary | ICD-10-CM

## 2015-05-27 DIAGNOSIS — Z79899 Other long term (current) drug therapy: Secondary | ICD-10-CM | POA: Diagnosis not present

## 2015-05-27 DIAGNOSIS — M1000 Idiopathic gout, unspecified site: Secondary | ICD-10-CM | POA: Diagnosis not present

## 2015-05-27 DIAGNOSIS — E785 Hyperlipidemia, unspecified: Secondary | ICD-10-CM

## 2015-05-27 MED ORDER — ESOMEPRAZOLE MAGNESIUM 40 MG PO CPDR: 40.0000 mg | DELAYED_RELEASE_CAPSULE | Freq: Every day | ORAL | Status: DC

## 2015-05-27 MED ORDER — SILDENAFIL CITRATE 100 MG PO TABS: 50.0000 mg | ORAL_TABLET | ORAL | Status: DC | PRN

## 2015-05-27 MED ORDER — PRAVASTATIN SODIUM 20 MG PO TABS: 20.0000 mg | ORAL_TABLET | Freq: Every day | ORAL | Status: DC

## 2015-05-27 MED ORDER — COLCHICINE 0.6 MG PO TABS: 0.6000 mg | ORAL_TABLET | Freq: Two times a day (BID) | ORAL | Status: DC

## 2015-05-27 MED ORDER — LISINOPRIL 40 MG PO TABS: 40.0000 mg | ORAL_TABLET | Freq: Every day | ORAL | Status: DC

## 2015-05-27 MED ORDER — ALLOPURINOL 300 MG PO TABS: 300.0000 mg | ORAL_TABLET | Freq: Every day | ORAL | Status: DC

## 2015-05-27 MED ORDER — METFORMIN HCL 500 MG PO TABS: 500.0000 mg | ORAL_TABLET | Freq: Two times a day (BID) | ORAL | Status: DC

## 2015-05-27 MED ORDER — ATENOLOL 100 MG PO TABS: 100.0000 mg | ORAL_TABLET | Freq: Every day | ORAL | Status: DC

## 2015-05-27 MED ORDER — HYDROCHLOROTHIAZIDE 25 MG PO TABS: 25.0000 mg | ORAL_TABLET | Freq: Every day | ORAL | Status: DC

## 2015-05-27 NOTE — Patient Instructions (Signed)
Thanks for coming in to see me today!  Keep up the great work with your weight loss.  We will call you with the results of your A1c

## 2015-05-27 NOTE — Progress Notes (Signed)
S:    Patient arrives in good spirits.    Presents for diabetes follow up.   Patient reports adherence with medications. Current diabetes medications include metformin 500 mg BID.   Patient denies hypoglycemic events.  Patient has been working hard to modify diet and increase exercise.    Patient denies nocturia. Patient reports neuropathy in his hands. Patient denies visual changes.    O:  . Lab Results  Component Value Date   HGBA1C 6.2% 02/24/2015     Home fasting CBG: <120  2 hour post-prandial/random CBG: <180.  A/P: Diabetes currently controlled based on A1c of 6.2 and reported home readings.  Denies hypoglycemic events and is able to verbalize appropriate hypoglycemia management plan.  Reports adherence with medication and denies any side effects.  Patient to continue metformin 500 mg BID.Congratulated patient on weight loss efforts. A1c ordered for today. Patient would like all of his medications filled at [facility] - all chronic prescriptions were sent to our pharmacy.   Next A1C anticipated now.  Written patient instructions provided.  Follow up in Pharmacist Clinic Visit as needed depending on A1c results.   Total time in face to face counseling 20 minutes.

## 2015-09-07 DIAGNOSIS — I219 Acute myocardial infarction, unspecified: Secondary | ICD-10-CM

## 2015-09-07 HISTORY — DX: Acute myocardial infarction, unspecified: I21.9

## 2015-11-12 ENCOUNTER — Emergency Department (HOSPITAL_COMMUNITY): Payer: Commercial Managed Care - HMO

## 2015-11-12 ENCOUNTER — Emergency Department (HOSPITAL_COMMUNITY)
Admission: EM | Admit: 2015-11-12 | Discharge: 2015-11-12 | Disposition: A | Discharge status: Home / Self Care | Payer: Commercial Managed Care - HMO | Attending: Emergency Medicine | Admitting: Emergency Medicine

## 2015-11-12 ENCOUNTER — Encounter (HOSPITAL_COMMUNITY): Payer: Self-pay | Admitting: Vascular Surgery

## 2015-11-12 DIAGNOSIS — I10 Essential (primary) hypertension: Secondary | ICD-10-CM | POA: Insufficient documentation

## 2015-11-12 DIAGNOSIS — Z7984 Long term (current) use of oral hypoglycemic drugs: Secondary | ICD-10-CM | POA: Diagnosis not present

## 2015-11-12 DIAGNOSIS — E119 Type 2 diabetes mellitus without complications: Secondary | ICD-10-CM | POA: Insufficient documentation

## 2015-11-12 DIAGNOSIS — E782 Mixed hyperlipidemia: Secondary | ICD-10-CM | POA: Insufficient documentation

## 2015-11-12 DIAGNOSIS — K219 Gastro-esophageal reflux disease without esophagitis: Secondary | ICD-10-CM | POA: Diagnosis not present

## 2015-11-12 DIAGNOSIS — M10062 Idiopathic gout, left knee: Secondary | ICD-10-CM | POA: Diagnosis not present

## 2015-11-12 DIAGNOSIS — Z79899 Other long term (current) drug therapy: Secondary | ICD-10-CM | POA: Diagnosis not present

## 2015-11-12 DIAGNOSIS — Z7952 Long term (current) use of systemic steroids: Secondary | ICD-10-CM | POA: Insufficient documentation

## 2015-11-12 DIAGNOSIS — M109 Gout, unspecified: Secondary | ICD-10-CM

## 2015-11-12 DIAGNOSIS — M25562 Pain in left knee: Secondary | ICD-10-CM | POA: Diagnosis present

## 2015-11-12 LAB — CBC WITH DIFFERENTIAL/PLATELET
BASOS ABS: 0 10*3/uL (ref 0.0–0.1)
BASOS PCT: 0 %
EOS ABS: 0.1 10*3/uL (ref 0.0–0.7)
Eosinophils Relative: 1 %
HEMATOCRIT: 44 % (ref 39.0–52.0)
HEMOGLOBIN: 15.3 g/dL (ref 13.0–17.0)
Lymphocytes Relative: 22 %
Lymphs Abs: 1.9 10*3/uL (ref 0.7–4.0)
MCH: 30.8 pg (ref 26.0–34.0)
MCHC: 34.8 g/dL (ref 30.0–36.0)
MCV: 88.7 fL (ref 78.0–100.0)
Monocytes Absolute: 1.3 10*3/uL — ABNORMAL HIGH (ref 0.1–1.0)
Monocytes Relative: 14 %
NEUTROS ABS: 5.6 10*3/uL (ref 1.7–7.7)
NEUTROS PCT: 63 %
Platelets: 264 10*3/uL (ref 150–400)
RBC: 4.96 MIL/uL (ref 4.22–5.81)
RDW: 14.2 % (ref 11.5–15.5)
WBC: 8.8 10*3/uL (ref 4.0–10.5)

## 2015-11-12 LAB — COMPREHENSIVE METABOLIC PANEL
ALK PHOS: 66 U/L (ref 38–126)
ALT: 9 U/L — ABNORMAL LOW (ref 17–63)
ANION GAP: 8 (ref 5–15)
AST: 23 U/L (ref 15–41)
Albumin: 2.9 g/dL — ABNORMAL LOW (ref 3.5–5.0)
BILIRUBIN TOTAL: 0.5 mg/dL (ref 0.3–1.2)
BUN: 10 mg/dL (ref 6–20)
CALCIUM: 9.2 mg/dL (ref 8.9–10.3)
CO2: 25 mmol/L (ref 22–32)
Chloride: 105 mmol/L (ref 101–111)
Creatinine, Ser: 1.03 mg/dL (ref 0.61–1.24)
GFR calc non Af Amer: >60 mL/min (ref >60)
Glucose, Bld: 120 mg/dL — ABNORMAL HIGH (ref 65–99)
POTASSIUM: 4.3 mmol/L (ref 3.5–5.1)
Sodium: 138 mmol/L (ref 135–145)
TOTAL PROTEIN: 7 g/dL (ref 6.5–8.1)

## 2015-11-12 LAB — URIC ACID: URIC ACID, SERUM: 5.6 mg/dL (ref 4.4–7.6)

## 2015-11-12 MED ORDER — LISINOPRIL 40 MG PO TABS: 40.0000 mg | ORAL_TABLET | Freq: Every day | ORAL | Status: DC

## 2015-11-12 MED ORDER — OXYCODONE-ACETAMINOPHEN 5-325 MG PO TABS
2.0000 | ORAL_TABLET | Freq: Once | ORAL | Status: AC
  Administered 2015-11-12: 2 via ORAL
  Filled 2015-11-12: qty 2

## 2015-11-12 MED ORDER — SODIUM CHLORIDE 0.9 % IV BOLUS (SEPSIS)
500.0000 mL | Freq: Once | INTRAVENOUS | Status: AC
  Administered 2015-11-12: 500 mL via INTRAVENOUS

## 2015-11-12 MED ORDER — INDOMETHACIN 25 MG PO CAPS: 50.0000 mg | ORAL_CAPSULE | Freq: Three times a day (TID) | ORAL | Status: DC | PRN

## 2015-11-12 MED ORDER — INDOMETHACIN 25 MG PO CAPS
50.0000 mg | ORAL_CAPSULE | Freq: Once | ORAL | Status: AC
  Administered 2015-11-12: 50 mg via ORAL
  Filled 2015-11-12: qty 2

## 2015-11-12 MED ORDER — HYDROCHLOROTHIAZIDE 25 MG PO TABS: 25.0000 mg | ORAL_TABLET | Freq: Every day | ORAL | Status: DC

## 2015-11-12 MED ORDER — OXYCODONE-ACETAMINOPHEN 5-325 MG PO TABS: 1.0000 | ORAL_TABLET | Freq: Four times a day (QID) | ORAL | Status: DC | PRN

## 2015-11-12 MED ORDER — PREDNISONE 20 MG PO TABS
60.0000 mg | ORAL_TABLET | Freq: Once | ORAL | Status: AC
  Administered 2015-11-12: 60 mg via ORAL
  Filled 2015-11-12: qty 3

## 2015-11-12 MED ORDER — PREDNISONE 20 MG PO TABS: 40.0000 mg | ORAL_TABLET | Freq: Every day | ORAL | Status: DC

## 2015-11-12 MED ORDER — KETOROLAC TROMETHAMINE 30 MG/ML IJ SOLN
30.0000 mg | Freq: Once | INTRAMUSCULAR | Status: AC
  Administered 2015-11-12: 30 mg via INTRAVENOUS
  Filled 2015-11-12: qty 1

## 2015-11-12 MED ORDER — MORPHINE SULFATE (PF) 4 MG/ML IV SOLN
4.0000 mg | Freq: Once | INTRAVENOUS | Status: AC
  Administered 2015-11-12: 4 mg via INTRAVENOUS
  Filled 2015-11-12: qty 1

## 2015-11-12 MED ORDER — ATENOLOL 100 MG PO TABS: 100.0000 mg | ORAL_TABLET | Freq: Every day | ORAL | Status: DC

## 2015-11-12 NOTE — ED Notes (Signed)
Pt ambulates independently and with steady gait at time of discharge. Discharge instructions and follow up information reviewed with patient. No other questions or concerns voiced at this time.  

## 2015-11-12 NOTE — ED Provider Notes (Signed)
CSN: [id number]     Arrival date & time 11/12/15  1248 History   By signing my name below, I, Emmanuella Mensah, attest that this documentation has been prepared under the direction and in the presence of Danelle Berry, PA-C. Electronically Signed: Angelene Giovanni, ED Scribe. 11/12/2015. 1:54 PM.    Chief Complaint  Patient presents with  . Gout   The history is provided by the patient. No language interpreter was used.   HPI Comments: [name] is a 55 y.o. male with a hx of HTN, hypercholesteremia, gout arthritis, and DM who presents to the Emergency Department complaining of gradually worsening constant pain and swelling from his left knee down to his left foot consistent with his gout flare up onset 3 days ago. He explains that his swelling started in his left hand one week ago and now has started in his left leg. Pt reports that he has been compliant with his gout medication (allopurinol, colchicine, and gout-out). Pt states that in the past he has received IV treatment for his gout flare up. He reports an allergy to Vicodin but adds that he normally receives Ibuprofen. He denies any fever, n/v, chills, diaphoresis, SOB, numbness, or weakness.   Past Medical History  Diagnosis Date  . Hypertension   . Hypercholesteremia   . Baker's cyst of knee   . Type II diabetes mellitus (HCC)   . GERD (gastroesophageal reflux disease)   . Gouty arthritis     "real bad" (01/17/2013)   Past Surgical History  Procedure Laterality Date  . Finger amputation Right 1980's    3rd & 4th digits "got them mashed" (01/17/2013)  . Knee arthroscopy Right 1980's   Family History  Problem Relation Age of Onset  . Diabetes Father   . Hypertension Mother   . Hypertension Father   . Hypertension Sister   . Hypertension Brother   . Hypertension Brother   . Hypertension Brother    Social History  Substance Use Topics  . Smoking status: Never Smoker   . Smokeless tobacco: Never Used  . Alcohol Use: 1.8  oz/week    3 Shots of liquor per week     Comment: ocassional     Review of Systems  Constitutional: Negative for fever, chills and diaphoresis.  Respiratory: Negative for shortness of breath.   Cardiovascular: Positive for leg swelling.  Gastrointestinal: Negative for nausea and vomiting.  Musculoskeletal: Positive for arthralgias (left leg).  Neurological: Negative for weakness and numbness.  All other systems reviewed and are negative.     Allergies  Other and Hydrocodone-acetaminophen  Home Medications   Prior to Admission medications   Medication Sig Start Date End Date Taking? Authorizing Provider  acetaminophen (TYLENOL) 325 MG tablet Take 650 mg by mouth every 6 (six) hours as needed (for pain.).    Historical Provider, MD  allopurinol (ZYLOPRIM) 300 MG tablet Take 1 tablet (300 mg total) by mouth daily. 05/27/15   Quentin Angst, MD  atenolol (TENORMIN) 100 MG tablet Take 1 tablet (100 mg total) by mouth daily. 11/12/15   Danelle Berry, PA-C  colchicine 0.6 MG tablet Take 1 tablet (0.6 mg total) by mouth 2 (two) times daily. For gout pain 05/27/15   Quentin Angst, MD  esomeprazole (NEXIUM) 40 MG capsule Take 1 capsule (40 mg total) by mouth daily. 05/27/15   Quentin Angst, MD  hydrochlorothiazide (HYDRODIURIL) 25 MG tablet Take 1 tablet (25 mg total) by mouth daily. 11/12/15   Danelle Berry,  PA-C  ibuprofen (ADVIL,MOTRIN) 200 MG tablet Take 200 mg by mouth every 6 (six) hours as needed (pain.).    Historical Provider, MD  indomethacin (INDOCIN) 25 MG capsule Take 2 capsules (50 mg total) by mouth 3 (three) times daily as needed. 11/12/15   Danelle Berry, PA-C  lisinopril (PRINIVIL,ZESTRIL) 40 MG tablet Take 1 tablet (40 mg total) by mouth daily. 11/12/15   Danelle Berry, PA-C  metFORMIN (GLUCOPHAGE) 500 MG tablet Take 1 tablet (500 mg total) by mouth 2 (two) times daily with a meal. 05/27/15   Quentin Angst, MD  naproxen sodium (ANAPROX) 220 MG tablet Take 440 mg by  mouth once as needed (pain.).    Historical Provider, MD  oxyCODONE-acetaminophen (PERCOCET/ROXICET) 5-325 MG tablet Take 1-2 tablets by mouth every 6 (six) hours as needed for severe pain. 11/12/15   Danelle Berry, PA-C  pravastatin (PRAVACHOL) 20 MG tablet Take 1 tablet (20 mg total) by mouth at bedtime. 05/27/15   Quentin Angst, MD  predniSONE (DELTASONE) 20 MG tablet Take 2 tablets (40 mg total) by mouth daily. 11/12/15   Danelle Berry, PA-C  sildenafil (VIAGRA) 100 MG tablet Take 0.5 tablets (50 mg total) by mouth as needed for erectile dysfunction. 05/27/15   Olugbemiga E Hyman Hopes, MD   BP 191/92 mmHg  Pulse 95  Temp(Src) 98.4 F (36.9 C) (Oral)  Resp 16  SpO2 96% Physical Exam  Constitutional: He is oriented to person, place, and time. He appears well-developed and well-nourished. No distress.  HENT:  Head: Normocephalic and atraumatic.  Right Ear: External ear normal.  Left Ear: External ear normal.  Nose: Nose normal.  Mouth/Throat: Oropharynx is clear and moist. No oropharyngeal exudate.  Eyes: Conjunctivae and EOM are normal. Pupils are equal, round, and reactive to light. Right eye exhibits no discharge. Left eye exhibits no discharge. No scleral icterus.  Neck: Normal range of motion. Neck supple. No JVD present. No tracheal deviation present.  Cardiovascular: Normal rate and regular rhythm.   Pulses:      Dorsalis pedis pulses are 2+ on the right side, and 2+ on the left side.  Pulmonary/Chest: Effort normal and breath sounds normal. No stridor. No respiratory distress.  Musculoskeletal: He exhibits edema and tenderness.       Left knee: He exhibits decreased range of motion, swelling and effusion. He exhibits no ecchymosis, no laceration and no erythema.  Left knee diffusely ttp, edematous with effusion, no erythema, ROM limited secondary to pain   Lymphadenopathy:    He has no cervical adenopathy.  Neurological: He is alert and oriented to person, place, and time. No sensory  deficit. He exhibits normal muscle tone. Coordination and gait normal.  Skin: Skin is warm and dry. No rash noted. He is not diaphoretic. No erythema. No pallor.  Psychiatric: He has a normal mood and affect. His behavior is normal. Judgment and thought content normal.  Nursing note and vitals reviewed.   ED Course  Procedures (including critical care time) DIAGNOSTIC STUDIES: Oxygen Saturation is 98% on RA, normal by my interpretation.    COORDINATION OF CARE: 1:50 PM- Pt advised of plan for treatment and pt agrees. Pt will receive x-ray for further evaluation.    Labs Review Labs Reviewed  CBC WITH DIFFERENTIAL/PLATELET - Abnormal; Notable for the following:    Monocytes Absolute 1.3 (*)    All other components within normal limits  COMPREHENSIVE METABOLIC PANEL - Abnormal; Notable for the following:    Glucose, Bld 120 (*)  Albumin 2.9 (*)    ALT 9 (*)    All other components within normal limits  URIC ACID    Imaging Review  DG Knee Complete 4 Views Left (Final result) Result time: 11/12/15 14:46:21   Final result by Rad Results In Interface (11/12/15 14:46:21)   Narrative:   CLINICAL DATA: Left knee pain and swelling  EXAM: LEFT KNEE - COMPLETE 4+ VIEW  COMPARISON: None.  FINDINGS: Diffuse soft tissue swelling is noted. Moderate suprapatellar joint effusion identified. No fractures or subluxations identified. There is no evidence of arthropathy or other focal bone abnormality. Soft tissues are unremarkable.  IMPRESSION: 1. Joint effusion. 2. Soft tissue swelling.   Electronically Signed By: Signa Kell M.D. On: 11/12/2015 14:46    Danelle Berry, PA-C has personally reviewed and evaluated these images and lab results as part of her medical decision-making.  MDM   Pt with CC of leg knee pain with swelling, consistent with hx of gouty flare, pt taking medications inconsistently. Xray obtained and reviewed, pertinent for joint effusion and soft  tissue swelling.  Labs obtained uric acid <6, pt has no leukocytosis, pt is afebrile, do not suspect septic arthritis.   Pt dc with indomethacin (50 mg PO TID), prednisone, pain meds. Discussed that pt should respond to treatment with in 24 hour of begining treatment & likely resolve in 2-3 days.  He was encouraged to f/up with his PCP, and return precautions were reviewed with the pt and with his wife, they verbalize understanding.   Final diagnoses:  Acute gout of left knee, unspecified cause   I personally performed the services described in this documentation, which was scribed in my presence. The recorded information has been reviewed and is accurate.     Danelle Berry, PA-C 11/14/15 1720  Alvira Monday, MD 11/14/15 1952

## 2015-11-12 NOTE — Discharge Instructions (Signed)
Gout °Gout is an inflammatory arthritis caused by a buildup of uric acid crystals in the joints. Uric acid is a chemical that is normally present in the blood. When the level of uric acid in the blood is too high it can form crystals that deposit in your joints and tissues. This causes joint redness, soreness, and swelling (inflammation). Repeat attacks are common. Over time, uric acid crystals can form into masses (tophi) near a joint, destroying bone and causing disfigurement. Gout is treatable and often preventable. °CAUSES  °The disease begins with elevated levels of uric acid in the blood. Uric acid is produced by your body when it breaks down a naturally found substance called purines. Certain foods you eat, such as meats and fish, contain high amounts of purines. Causes of an elevated uric acid level include: °· Being passed down from parent to child (heredity). °· Diseases that cause increased uric acid production (such as obesity, psoriasis, and certain cancers). °· Excessive alcohol use. °· Diet, especially diets rich in meat and seafood. °· Medicines, including certain cancer-fighting medicines (chemotherapy), water pills (diuretics), and aspirin. °· Chronic kidney disease. The kidneys are no longer able to remove uric acid well. °· Problems with metabolism. °Conditions strongly associated with gout include: °· Obesity. °· High blood pressure. °· High cholesterol. °· Diabetes. °Not everyone with elevated uric acid levels gets gout. It is not understood why some people get gout and others do not. Surgery, joint injury, and eating too much of certain foods are some of the factors that can lead to gout attacks. °SYMPTOMS  °· An attack of gout comes on quickly. It causes intense pain with redness, swelling, and warmth in a joint. °· Fever can occur. °· Often, only one joint is involved. Certain joints are more commonly involved: °· Base of the big toe. °· Knee. °· Ankle. °· Wrist. °· Finger. °Without  treatment, an attack usually goes away in a few days to weeks. Between attacks, you usually will not have symptoms, which is different from many other forms of arthritis. °DIAGNOSIS  °Your caregiver will suspect gout based on your symptoms and exam. In some cases, tests may be recommended. The tests may include: °· Blood tests. °· Urine tests. °· X-rays. °· Joint fluid exam. This exam requires a needle to remove fluid from the joint (arthrocentesis). Using a microscope, gout is confirmed when uric acid crystals are seen in the joint fluid. °TREATMENT  °There are two phases to gout treatment: treating the sudden onset (acute) attack and preventing attacks (prophylaxis). °· Treatment of an Acute Attack. °· Medicines are used. These include anti-inflammatory medicines or steroid medicines. °· An injection of steroid medicine into the affected joint is sometimes necessary. °· The painful joint is rested. Movement can worsen the arthritis. °· You may use warm or cold treatments on painful joints, depending which works best for you. °· Treatment to Prevent Attacks. °· If you suffer from frequent gout attacks, your caregiver may advise preventive medicine. These medicines are started after the acute attack subsides. These medicines either help your kidneys eliminate uric acid from your body or decrease your uric acid production. You may need to stay on these medicines for a very long time. °· The early phase of treatment with preventive medicine can be associated with an increase in acute gout attacks. For this reason, during the first few months of treatment, your caregiver may also advise you to take medicines usually used for acute gout treatment. Be sure you   understand your caregiver's directions. Your caregiver may make several adjustments to your medicine dose before these medicines are effective.  Discuss dietary treatment with your caregiver or dietitian. Alcohol and drinks high in sugar and fructose and foods  such as meat, poultry, and seafood can increase uric acid levels. Your caregiver or dietitian can advise you on drinks and foods that should be limited. HOME CARE INSTRUCTIONS   Do not take aspirin to relieve pain. This raises uric acid levels.  Only take over-the-counter or prescription medicines for pain, discomfort, or fever as directed by your caregiver.  Rest the joint as much as possible. When in bed, keep sheets and blankets off painful areas.  Keep the affected joint raised (elevated).  Apply warm or cold treatments to painful joints. Use of warm or cold treatments depends on which works best for you.  Use crutches if the painful joint is in your leg.  Drink enough fluids to keep your urine clear or pale yellow. This helps your body get rid of uric acid. Limit alcohol, sugary drinks, and fructose drinks.  Follow your dietary instructions. Pay careful attention to the amount of protein you eat. Your daily diet should emphasize fruits, vegetables, whole grains, and fat-free or low-fat milk products. Discuss the use of coffee, vitamin C, and cherries with your caregiver or dietitian. These may be helpful in lowering uric acid levels.  Maintain a healthy body weight. SEEK MEDICAL CARE IF:   You develop diarrhea, vomiting, or any side effects from medicines.  You do not feel better in 24 hours, or you are getting worse. SEEK IMMEDIATE MEDICAL CARE IF:   Your joint becomes suddenly more tender, and you have chills or a fever. MAKE SURE YOU:   Understand these instructions.  Will watch your condition.  Will get help right away if you are not doing well or get worse.   This information is not intended to replace advice given to you by your health care provider. Make sure you discuss any questions you have with your health care provider.   Document Released: 08/20/2000 Document Revised: 09/13/2014 Document Reviewed: 04/05/2012 Elsevier Interactive Patient Education 2016  [employer] Knee Pain Knee pain is a very common symptom and can have many causes. Knee pain often goes away when you follow your health care provider's instructions for relieving pain and discomfort at home. However, knee pain can develop into a condition that needs treatment. Some conditions may include:  Arthritis caused by wear and tear (osteoarthritis).  Arthritis caused by swelling and irritation (rheumatoid arthritis or gout).  A cyst or growth in your knee.  An infection in your knee joint.  An injury that will not heal.  Damage, swelling, or irritation of the tissues that support your knee (torn ligaments or tendinitis). If your knee pain continues, additional tests may be ordered to diagnose your condition. Tests may include X-rays or other imaging studies of your knee. You may also need to have fluid removed from your knee. Treatment for ongoing knee pain depends on the cause, but treatment may include:  Medicines to relieve pain or swelling.  Steroid injections in your knee.  Physical therapy.  Surgery. HOME CARE INSTRUCTIONS  Take medicines only as directed by your health care provider.  Rest your knee and keep it raised (elevated) while you are resting.  Do not do things that cause or worsen pain.  Avoid high-impact activities or exercises, such as running, jumping rope, or doing jumping jacks.  Apply ice  to the knee area:  Put ice in a plastic bag.  Place a towel between your skin and the bag.  Leave the ice on for 20 minutes, 2-3 times a day.  Ask your health care provider if you should wear an elastic knee support.  Keep a pillow under your knee when you sleep.  Lose weight if you are overweight. Extra weight can put pressure on your knee.  Do not use any tobacco products, including cigarettes, chewing tobacco, or electronic cigarettes. If you need help quitting, ask your health care provider. Smoking may slow the healing of any bone and joint  problems that you may have. SEEK MEDICAL CARE IF:  Your knee pain continues, changes, or gets worse.  You have a fever along with knee pain.  Your knee buckles or locks up.  Your knee becomes more swollen. SEEK IMMEDIATE MEDICAL CARE IF:   Your knee joint feels hot to the touch.  You have chest pain or trouble breathing.   This information is not intended to replace advice given to you by your health care provider. Make sure you discuss any questions you have with your health care provider.   Document Released: 06/20/2007 Document Revised: 09/13/2014 Document Reviewed: 04/08/2014 Elsevier Interactive Patient Education 2016 Else[employer] Inc[employer]   Low-Purine Diet Purines are compounds that affect the level of uric acid in your body. A low-purine diet is a diet that is low in purines. Eating a low-purine diet can prevent the level of uric acid in your body from getting too high and causing gout or kidney stones or both. WHAT DO I NEED TO KNOW ABOUT THIS DIET?  Choose low-purine foods. Examples of low-purine foods are listed in the next section.  Drink plenty of fluids, especially water. Fluids can help remove uric acid from your body. Try to drink 8-16 cups (1.9-3.8 L) a day.  Limit foods high in fat, especially saturated fat, as fat makes it harder for the body to get rid of uric acid. Foods high in saturated fat include pizza, cheese, ice cream, whole milk, fried foods, and gravies. Choose foods that are lower in fat and lean sources of protein. Use olive oil when cooking as it contains healthy fats that are not high in saturated fat.  Limit alcohol. Alcohol interferes with the elimination of uric acid from your body. If you are having a gout attack, avoid all alcohol.  Keep in mind that different people's bodies react differently to different foods. You will probably learn over time which foods do or do not affect you. If you discover that a food tends to cause your gout to flare up,  avoid eating that food. You can more freely enjoy foods that do not cause problems. If you have any questions about a food item, talk to your dietitian or health care provider. WHICH FOODS ARE LOW, MODERATE, AND HIGH IN PURINES? The following is a list of foods that are low, moderate, and high in purines. You can eat any amount of the foods that are low in purines. You may be able to have small amounts of foods that are moderate in purines. Ask your health care provider how much of a food moderate in purines you can have. Avoid foods high in purines. Grains  Foods low in purines: Enriched white bread, pasta, rice, cake, cornbread, popcorn.  Foods moderate in purines: Whole-grain breads and cereals, wheat germ, bran, oatmeal. Uncooked oatmeal. Dry wheat bran or wheat germ.  Foods high in purines:  Pancakes, [place] toast, biscuits, muffins. Vegetables  Foods low in purines: All vegetables, except those that are moderate in purines.  Foods moderate in purines: Asparagus, cauliflower, spinach, mushrooms, green peas. Fruits  All fruits are low in purines. Meats and other Protein Foods  Foods low in purines: Eggs, nuts, peanut butter.  Foods moderate in purines: 80-90% lean beef, lamb, veal, pork, poultry, fish, eggs, peanut butter, nuts. Crab, lobster, oysters, and shrimp. Cooked dried beans, peas, and lentils.  Foods high in purines: Anchovies, sardines, herring, mussels, tuna, codfish, scallops, trout, and haddock. [name]. Organ meats (such as liver or kidney). Tripe. Game meat. Goose. Sweetbreads. Dairy  All dairy foods are low in purines. Low-fat and fat-free dairy products are best because they are low in saturated fat. Beverages  Drinks low in purines: Water, carbonated beverages, tea, coffee, cocoa.  Drinks moderate in purines: Soft drinks and other drinks sweetened with high-fructose corn syrup. Juices. To find whether a food or drink is sweetened with high-fructose corn syrup, look  at the ingredients list.  Drinks high in purines: Alcoholic beverages (such as beer). Condiments  Foods low in purines: Salt, herbs, olives, pickles, relishes, vinegar.  Foods moderate in purines: Butter, margarine, oils, mayonnaise. Fats and Oils  Foods low in purines: All types, except gravies and sauces made with meat.  Foods high in purines: Gravies and sauces made with meat. Other Foods  Foods low in purines: Sugars, sweets, gelatin. Cake. Soups made without meat.  Foods moderate in purines: Meat-based or fish-based soups, broths, or bouillons. Foods and drinks sweetened with high-fructose corn syrup.  Foods high in purines: High-fat desserts (such as ice cream, cookies, cakes, pies, doughnuts, and chocolate). Contact your dietitian for more information on foods that are not listed here.   This information is not intended to replace advice given to you by your health care provider. Make sure you discuss any questions you have with your health care provider.   Document Released: 12/18/2010 Document Revised: 08/28/2013 Document Reviewed: 07/30/2013 Elsevier Interactive Patient Education [employer]

## 2015-11-12 NOTE — ED Notes (Signed)
Pt reports to the ED for eval of left leg pain. Pt has hx of gout and states that this feels similar to an exacerbation of this. Pt has some left leg swelling as well. No erythema noted. Pt is on allopurinol, gout-out, and colchiceine without relief. Pt A&Ox4, resp e/u, and skin warm and dry.

## 2015-11-15 ENCOUNTER — Emergency Department (HOSPITAL_COMMUNITY): Payer: Commercial Managed Care - HMO

## 2015-11-15 ENCOUNTER — Inpatient Hospital Stay (HOSPITAL_COMMUNITY)
Admission: EM | Admit: 2015-11-15 | Discharge: 2015-12-04 | DRG: 216 | Disposition: A | Discharge status: Home / Self Care | Payer: Commercial Managed Care - HMO | Attending: Surgery | Admitting: Surgery

## 2015-11-15 ENCOUNTER — Encounter (HOSPITAL_COMMUNITY): Payer: Self-pay

## 2015-11-15 DIAGNOSIS — Q231 Congenital insufficiency of aortic valve: Principal | ICD-10-CM

## 2015-11-15 DIAGNOSIS — Z952 Presence of prosthetic heart valve: Secondary | ICD-10-CM

## 2015-11-15 DIAGNOSIS — I440 Atrioventricular block, first degree: Secondary | ICD-10-CM | POA: Diagnosis not present

## 2015-11-15 DIAGNOSIS — R0602 Shortness of breath: Secondary | ICD-10-CM | POA: Diagnosis not present

## 2015-11-15 DIAGNOSIS — Z7984 Long term (current) use of oral hypoglycemic drugs: Secondary | ICD-10-CM

## 2015-11-15 DIAGNOSIS — E785 Hyperlipidemia, unspecified: Secondary | ICD-10-CM | POA: Diagnosis present

## 2015-11-15 DIAGNOSIS — T39395A Adverse effect of other nonsteroidal anti-inflammatory drugs [NSAID], initial encounter: Secondary | ICD-10-CM | POA: Diagnosis present

## 2015-11-15 DIAGNOSIS — Q211 Atrial septal defect: Secondary | ICD-10-CM

## 2015-11-15 DIAGNOSIS — G4700 Insomnia, unspecified: Secondary | ICD-10-CM | POA: Diagnosis present

## 2015-11-15 DIAGNOSIS — I2510 Atherosclerotic heart disease of native coronary artery without angina pectoris: Secondary | ICD-10-CM

## 2015-11-15 DIAGNOSIS — D689 Coagulation defect, unspecified: Secondary | ICD-10-CM | POA: Diagnosis not present

## 2015-11-15 DIAGNOSIS — E7800 Pure hypercholesterolemia, unspecified: Secondary | ICD-10-CM | POA: Diagnosis present

## 2015-11-15 DIAGNOSIS — R112 Nausea with vomiting, unspecified: Secondary | ICD-10-CM | POA: Diagnosis present

## 2015-11-15 DIAGNOSIS — Z01818 Encounter for other preprocedural examination: Secondary | ICD-10-CM

## 2015-11-15 DIAGNOSIS — I4891 Unspecified atrial fibrillation: Secondary | ICD-10-CM | POA: Diagnosis not present

## 2015-11-15 DIAGNOSIS — R0600 Dyspnea, unspecified: Secondary | ICD-10-CM | POA: Diagnosis present

## 2015-11-15 DIAGNOSIS — K921 Melena: Secondary | ICD-10-CM | POA: Diagnosis present

## 2015-11-15 DIAGNOSIS — K0889 Other specified disorders of teeth and supporting structures: Secondary | ICD-10-CM

## 2015-11-15 DIAGNOSIS — R1013 Epigastric pain: Secondary | ICD-10-CM | POA: Diagnosis present

## 2015-11-15 DIAGNOSIS — I509 Heart failure, unspecified: Secondary | ICD-10-CM

## 2015-11-15 DIAGNOSIS — Z888 Allergy status to other drugs, medicaments and biological substances status: Secondary | ICD-10-CM

## 2015-11-15 DIAGNOSIS — E119 Type 2 diabetes mellitus without complications: Secondary | ICD-10-CM

## 2015-11-15 DIAGNOSIS — Z833 Family history of diabetes mellitus: Secondary | ICD-10-CM

## 2015-11-15 DIAGNOSIS — M109 Gout, unspecified: Secondary | ICD-10-CM | POA: Diagnosis present

## 2015-11-15 DIAGNOSIS — I272 Other secondary pulmonary hypertension: Secondary | ICD-10-CM | POA: Diagnosis present

## 2015-11-15 DIAGNOSIS — K0530 Chronic periodontitis, unspecified: Secondary | ICD-10-CM | POA: Diagnosis present

## 2015-11-15 DIAGNOSIS — Z951 Presence of aortocoronary bypass graft: Secondary | ICD-10-CM

## 2015-11-15 DIAGNOSIS — K219 Gastro-esophageal reflux disease without esophagitis: Secondary | ICD-10-CM | POA: Diagnosis present

## 2015-11-15 DIAGNOSIS — I351 Nonrheumatic aortic (valve) insufficiency: Secondary | ICD-10-CM | POA: Diagnosis present

## 2015-11-15 DIAGNOSIS — K089 Disorder of teeth and supporting structures, unspecified: Secondary | ICD-10-CM

## 2015-11-15 DIAGNOSIS — M7989 Other specified soft tissue disorders: Secondary | ICD-10-CM

## 2015-11-15 DIAGNOSIS — M79661 Pain in right lower leg: Secondary | ICD-10-CM

## 2015-11-15 DIAGNOSIS — D62 Acute posthemorrhagic anemia: Secondary | ICD-10-CM | POA: Diagnosis not present

## 2015-11-15 DIAGNOSIS — K029 Dental caries, unspecified: Secondary | ICD-10-CM | POA: Diagnosis present

## 2015-11-15 DIAGNOSIS — I5031 Acute diastolic (congestive) heart failure: Secondary | ICD-10-CM

## 2015-11-15 DIAGNOSIS — M170 Bilateral primary osteoarthritis of knee: Secondary | ICD-10-CM | POA: Diagnosis present

## 2015-11-15 DIAGNOSIS — I959 Hypotension, unspecified: Secondary | ICD-10-CM | POA: Diagnosis not present

## 2015-11-15 DIAGNOSIS — Z8249 Family history of ischemic heart disease and other diseases of the circulatory system: Secondary | ICD-10-CM

## 2015-11-15 DIAGNOSIS — M264 Malocclusion, unspecified: Secondary | ICD-10-CM | POA: Diagnosis present

## 2015-11-15 DIAGNOSIS — F419 Anxiety disorder, unspecified: Secondary | ICD-10-CM | POA: Diagnosis present

## 2015-11-15 DIAGNOSIS — K045 Chronic apical periodontitis: Secondary | ICD-10-CM | POA: Diagnosis present

## 2015-11-15 DIAGNOSIS — I110 Hypertensive heart disease with heart failure: Secondary | ICD-10-CM | POA: Diagnosis present

## 2015-11-15 DIAGNOSIS — E1121 Type 2 diabetes mellitus with diabetic nephropathy: Secondary | ICD-10-CM | POA: Diagnosis present

## 2015-11-15 DIAGNOSIS — Z89021 Acquired absence of right finger(s): Secondary | ICD-10-CM

## 2015-11-15 DIAGNOSIS — M4696 Unspecified inflammatory spondylopathy, lumbar region: Secondary | ICD-10-CM | POA: Diagnosis present

## 2015-11-15 DIAGNOSIS — Z885 Allergy status to narcotic agent status: Secondary | ICD-10-CM

## 2015-11-15 DIAGNOSIS — J939 Pneumothorax, unspecified: Secondary | ICD-10-CM

## 2015-11-15 DIAGNOSIS — N179 Acute kidney failure, unspecified: Secondary | ICD-10-CM | POA: Diagnosis present

## 2015-11-15 DIAGNOSIS — K083 Retained dental root: Secondary | ICD-10-CM | POA: Diagnosis present

## 2015-11-15 DIAGNOSIS — M1000 Idiopathic gout, unspecified site: Secondary | ICD-10-CM | POA: Diagnosis present

## 2015-11-15 LAB — BRAIN NATRIURETIC PEPTIDE: B NATRIURETIC PEPTIDE 5: 1075.7 pg/mL — AB (ref 0.0–100.0)

## 2015-11-15 LAB — URINALYSIS, ROUTINE W REFLEX MICROSCOPIC
Bilirubin Urine: NEGATIVE
GLUCOSE, UA: NEGATIVE mg/dL
Ketones, ur: NEGATIVE mg/dL
Leukocytes, UA: NEGATIVE
Nitrite: NEGATIVE
Protein, ur: 100 mg/dL — AB
SPECIFIC GRAVITY, URINE: 1.019 (ref 1.005–1.030)
pH: 5.5 (ref 5.0–8.0)

## 2015-11-15 LAB — CBC
HEMATOCRIT: 45.5 % (ref 39.0–52.0)
Hemoglobin: 15.6 g/dL (ref 13.0–17.0)
MCH: 30.5 pg (ref 26.0–34.0)
MCHC: 34.3 g/dL (ref 30.0–36.0)
MCV: 88.9 fL (ref 78.0–100.0)
Platelets: 351 10*3/uL (ref 150–400)
RBC: 5.12 MIL/uL (ref 4.22–5.81)
RDW: 14.6 % (ref 11.5–15.5)
WBC: 10.9 10*3/uL — ABNORMAL HIGH (ref 4.0–10.5)

## 2015-11-15 LAB — BASIC METABOLIC PANEL
Anion gap: 12 (ref 5–15)
BUN: 45 mg/dL — AB (ref 6–20)
CHLORIDE: 106 mmol/L (ref 101–111)
CO2: 19 mmol/L — AB (ref 22–32)
Calcium: 9.1 mg/dL (ref 8.9–10.3)
Creatinine, Ser: 1.8 mg/dL — ABNORMAL HIGH (ref 0.61–1.24)
GFR calc Af Amer: 47 mL/min — ABNORMAL LOW (ref >60)
GFR calc non Af Amer: 40 mL/min — ABNORMAL LOW (ref >60)
GLUCOSE: 130 mg/dL — AB (ref 65–99)
POTASSIUM: 4.6 mmol/L (ref 3.5–5.1)
Sodium: 137 mmol/L (ref 135–145)

## 2015-11-15 LAB — SODIUM, URINE, RANDOM: Sodium, Ur: 44 mmol/L

## 2015-11-15 LAB — I-STAT TROPONIN, ED: Troponin i, poc: 0.01 ng/mL (ref 0.00–0.08)

## 2015-11-15 LAB — URINE MICROSCOPIC-ADD ON

## 2015-11-15 LAB — CREATININE, URINE, RANDOM: Creatinine, Urine: 110.78 mg/dL

## 2015-11-15 LAB — TSH: TSH: 3.027 u[IU]/mL (ref 0.350–4.500)

## 2015-11-15 LAB — GLUCOSE, CAPILLARY
GLUCOSE-CAPILLARY: 124 mg/dL — AB (ref 65–99)
Glucose-Capillary: 110 mg/dL — ABNORMAL HIGH (ref 65–99)

## 2015-11-15 MED ORDER — FUROSEMIDE 20 MG PO TABS: 20.0000 mg | ORAL_TABLET | Freq: Every day | ORAL | Status: DC

## 2015-11-15 MED ORDER — ADULT MULTIVITAMIN W/MINERALS CH
1.0000 | ORAL_TABLET | Freq: Every day | ORAL | Status: DC
  Administered 2015-11-16 – 2015-11-24 (×9): 1 via ORAL
  Filled 2015-11-15 (×8): qty 1

## 2015-11-15 MED ORDER — AZELASTINE HCL 0.1 % NA SOLN
1.0000 | NASAL | Status: DC
  Administered 2015-11-16 – 2015-11-24 (×5): 1 via NASAL
  Filled 2015-11-15: qty 30

## 2015-11-15 MED ORDER — ATENOLOL 100 MG PO TABS
100.0000 mg | ORAL_TABLET | Freq: Every day | ORAL | Status: DC
  Administered 2015-11-15 – 2015-11-20 (×6): 100 mg via ORAL
  Filled 2015-11-15 (×2): qty 1
  Filled 2015-11-15: qty 4
  Filled 2015-11-15 (×2): qty 1
  Filled 2015-11-15: qty 4
  Filled 2015-11-15: qty 1
  Filled 2015-11-15: qty 4
  Filled 2015-11-15: qty 1
  Filled 2015-11-15 (×2): qty 4

## 2015-11-15 MED ORDER — HEPARIN SODIUM (PORCINE) 5000 UNIT/ML IJ SOLN
5000.0000 [IU] | Freq: Three times a day (TID) | INTRAMUSCULAR | Status: DC
  Administered 2015-11-15 – 2015-11-19 (×13): 5000 [IU] via SUBCUTANEOUS
  Filled 2015-11-15 (×12): qty 1

## 2015-11-15 MED ORDER — AZELASTINE HCL 0.1 % NA SOLN: 1.0000 | NASAL | Status: DC

## 2015-11-15 MED ORDER — SODIUM CHLORIDE 0.9 % IV BOLUS (SEPSIS)
1000.0000 mL | Freq: Once | INTRAVENOUS | Status: AC
  Administered 2015-11-15: 1000 mL via INTRAVENOUS

## 2015-11-15 MED ORDER — PANTOPRAZOLE SODIUM 40 MG PO TBEC: 80.0000 mg | DELAYED_RELEASE_TABLET | Freq: Every day | ORAL | Status: DC

## 2015-11-15 MED ORDER — ADULT MULTIVITAMIN W/MINERALS CH
1.0000 | ORAL_TABLET | Freq: Every day | ORAL | Status: DC
  Administered 2015-11-15: 1 via ORAL

## 2015-11-15 MED ORDER — ACETAMINOPHEN 325 MG PO TABS: 650.0000 mg | ORAL_TABLET | Freq: Four times a day (QID) | ORAL | Status: DC | PRN

## 2015-11-15 MED ORDER — INSULIN ASPART 100 UNIT/ML ~~LOC~~ SOLN
0.0000 [IU] | Freq: Three times a day (TID) | SUBCUTANEOUS | Status: DC
  Administered 2015-11-17: 1 [IU] via SUBCUTANEOUS
  Administered 2015-11-20: 2 [IU] via SUBCUTANEOUS
  Administered 2015-11-21 – 2015-11-24 (×2): 1 [IU] via SUBCUTANEOUS

## 2015-11-15 MED ORDER — OXYCODONE-ACETAMINOPHEN 5-325 MG PO TABS
1.0000 | ORAL_TABLET | Freq: Four times a day (QID) | ORAL | Status: DC | PRN
  Administered 2015-11-21 (×2): 2 via ORAL
  Filled 2015-11-15: qty 2

## 2015-11-15 MED ORDER — PRAVASTATIN SODIUM 40 MG PO TABS
20.0000 mg | ORAL_TABLET | Freq: Every day | ORAL | Status: DC
Start: 2015-11-15 — End: 2015-11-22
  Administered 2015-11-15 – 2015-11-21 (×7): 20 mg via ORAL
  Filled 2015-11-15 (×8): qty 1

## 2015-11-15 MED ORDER — INSULIN ASPART 100 UNIT/ML ~~LOC~~ SOLN: 0.0000 [IU] | SUBCUTANEOUS | Status: DC

## 2015-11-15 MED ORDER — ALLOPURINOL 300 MG PO TABS
300.0000 mg | ORAL_TABLET | Freq: Every day | ORAL | Status: DC
  Administered 2015-11-15 – 2015-12-04 (×18): 300 mg via ORAL
  Filled 2015-11-15 (×18): qty 1

## 2015-11-15 NOTE — ED Notes (Signed)
Pt states he isn't able to "go to the bathroom", but had a BM and urinated this morning. Pt states he feels full and is unable to eat much and is experiencing nausea.

## 2015-11-15 NOTE — ED Notes (Signed)
Called lab to add on BNP. ?

## 2015-11-15 NOTE — ED Notes (Signed)
Bladder scan reveals only 72mL in bladder at this time. Dr. Corlis Leak notified.

## 2015-11-15 NOTE — Progress Notes (Signed)
Paged Dr. Daiva Eves that pt was in room. Will continue to monitor. Francee Piccolo [employer] fundraiser

## 2015-11-15 NOTE — ED Notes (Signed)
Patient transported to X-ray 

## 2015-11-15 NOTE — ED Notes (Addendum)
2 failed IV attempts. IV team consult placed. Pt remained 100% on RA during ambulation, pt was SOB.

## 2015-11-15 NOTE — H&P (Signed)
Date: 11/15/2015               Patient Name:  [name] MRN: [id number]  DOB: 10/18/1960 Age / Sex: 55 y.o., male   PCP: Quentin Angst, MD         Medical Service: Internal Medicine Teaching Service         Attending Physician: Dr. Randall Hiss, MD    First Contact: Dr. Dimple Casey Pager: [phone]  Second Contact: Dr. Danella Penton Pager: [phone]       After Hours (After 5p/  First Contact Pager: [phone]  weekends / holidays): Second Contact Pager: [phone]   Chief Complaint: Dyspnea on exertion  History of Present Illness: 55 year old African American man with PMHx HTN, T2DM, GERD, bicuspid aortic valve, and gout presents to the ED for worsening dyspnea on exertion and chest pain. He was recently evaluated at the ED on 3/8 for a left knee acute gout flare most likely 2/2 recently more frequent EtOH use. He was started on indomethacin and prednisone and also restarted on his home lisinopril and atenolol. His gout symptoms subsequently improved.  Since starting these medications he has ben suffering worsened abdominal pain and vomiting, dyspnea and chest pain that improves at 15 minutes resting, plus anxiety and insomnia. He has felt some abdominal bloating and urinary urgency disproportionate to the volumes passed. He denies any bleeding or diarrhea. His epigastric pain is partially improved when eating. He stopped taking these medications last dose on 3/9 in PM.  After arrival to ED his symptoms were mostly improved except continued dyspnea on exertion. CXR obtained showing mild vascular congestion. EKG, TSH, troponins negative. He was admitted for workup and evaluation of new heart failure symptoms.  Meds: Current Facility-Administered Medications  Medication Dose Route Frequency Provider Last Rate Last Dose  . allopurinol (ZYLOPRIM) tablet 300 mg  300 mg Oral Daily Denton Brick, MD   300 mg at 11/15/15 1750  . atenolol (TENORMIN) tablet 100 mg  100 mg Oral Daily Denton Brick,  MD   100 mg at 11/15/15 1750  . [START ON 11/16/2015] azelastine (ASTELIN) 0.1 % nasal spray 1 spray  1 spray Each Nare QODAY Randall Hiss, MD      . heparin injection 5,000 Units  5,000 Units Subcutaneous 3 times per day Denton Brick, MD   5,000 Units at 11/15/15 1749  . insulin aspart (novoLOG) injection 0-9 Units  0-9 Units Subcutaneous TID WC Denton Brick, MD   0 Units at 11/15/15 1737  . [START ON 11/16/2015] multivitamin with minerals tablet 1 tablet  1 tablet Oral Daily Randall Hiss, MD      . oxyCODONE-acetaminophen (PERCOCET/ROXICET) 5-325 MG per tablet 1-2 tablet  1-2 tablet Oral Q6H PRN Denton Brick, MD      . [name] ON 11/16/2015] pantoprazole (PROTONIX) EC tablet 80 mg  80 mg Oral Q1200 Denton Brick, MD      . pravastatin (PRAVACHOL) tablet 20 mg  20 mg Oral QHS Denton Brick, MD        Allergies: Allergies as of 11/15/2015 - Review Complete 11/15/2015  Allergen Reaction Noted  . Other Anaphylaxis 11/17/2013  . Hydrocodone-acetaminophen Itching 04/08/2010   Past Medical History  Diagnosis Date  . Hypertension   . Hypercholesteremia   . Baker's cyst of knee   . Type II diabetes mellitus (HCC)   . GERD (gastroesophageal reflux disease)   . Gouty arthritis     "  real bad" (01/17/2013)   Past Surgical History  Procedure Laterality Date  . Finger amputation Right 1980's    3rd & 4th digits "got them mashed" (01/17/2013)  . Knee arthroscopy Right 1980's   Family History  Problem Relation Age of Onset  . Diabetes Father   . Hypertension Mother   . Hypertension Father   . Hypertension Sister   . Hypertension Brother   . Hypertension Brother   . Hypertension Brother    Social History   Social History  . Marital Status: Widowed    Spouse Name: N/A  . Number of Children: N/A  . Years of Education: N/A   Occupational History  . Not on file.   Social History Main Topics  . Smoking status: Never Smoker   . Smokeless tobacco: Never Used  .  Alcohol Use: 1.8 oz/week    3 Shots of liquor per week     Comment: ocassional   . Drug Use: Yes    Special: Marijuana     Comment: last weed was 1 week ago. 04/16/15  . Sexual Activity: Yes   Other Topics Concern  . Not on file   Social History Narrative    Review of Systems: Review of Systems  Constitutional: Negative for fever.  Eyes: Negative for blurred vision.  Respiratory: Positive for shortness of breath. Negative for cough and sputum production.   Cardiovascular: Positive for chest pain. Negative for leg swelling.  Gastrointestinal: Positive for vomiting and abdominal pain. Negative for diarrhea, blood in stool and melena.  Genitourinary: Positive for urgency and frequency. Negative for dysuria.  Musculoskeletal: Negative for falls.  Skin: Negative for rash.  Neurological: Negative for dizziness and headaches.  Endo/Heme/Allergies: Does not bruise/bleed easily.  Psychiatric/Behavioral: The patient is nervous/anxious and has insomnia.     Physical Exam: Blood pressure 144/84, pulse 71, temperature 97.8 F (36.6 C), temperature source Oral, resp. rate 18, height 5\' 4"  (1.626 m), weight 104.1 kg (229 lb 8 oz), SpO2 100 %.   GENERAL- alert, co-operative, NAD HEENT- Atraumatic, oral mucosa appears moist, no carotid bruit CARDIAC- RRR, murmur audible loudly over RUSB extending to axilla RESP- CTAB, no wheezes or crackles. ABDOMEN- Soft, nontender, no guarding or rebound, normoactive bowel sounds present BACK- No paraspinal tenderness, no CVA tenderness. NEURO- No obvious Cr N abnormality, strength upper and lower extremities- 5/5 EXTREMITIES- no pedal edema, missing distal phalanges of 3rd and 4th digits on right hand SKIN- Warm, dry, No rash or lesion. PSYCH- Normal mood and affect, appropriate thought content and speech.   Lab results: Basic Metabolic Panel:  Recent Labs  71/24/58 0853  NA 137  K 4.6  CL 106  CO2 19*  GLUCOSE 130*  BUN 45*  CREATININE  1.80*  CALCIUM 9.1   CBC:  Recent Labs  11/15/15 0853  WBC 10.9*  HGB 15.6  HCT 45.5  MCV 88.9  PLT 351   CBG:  Recent Labs  11/15/15 1658  GLUCAP 110*   Thyroid Function Tests:  Recent Labs  11/15/15 1704  TSH 3.027   Urine Drug Screen: Drugs of Abuse   Alcohol Level: No results for input(s): ETH in the last 72 hours. Urinalysis:  Recent Labs  11/15/15 1500  COLORURINE YELLOW  LABSPEC 1.019  PHURINE 5.5  GLUCOSEU NEGATIVE  HGBUR SMALL*  BILIRUBINUR NEGATIVE  KETONESUR NEGATIVE  PROTEINUR 100*  NITRITE NEGATIVE  LEUKOCYTESUR NEGATIVE    Imaging results:  Dg Chest 2 View  11/15/2015  CLINICAL DATA:  Chest  pain. Nausea and difficulty breathing with new medication. EXAM: CHEST - 2 VIEW COMPARISON:  Chest and right rib radiographs 11/29/2014. FINDINGS: Mild cardiac enlargement is exaggerated by low lung volumes. Moderate pulmonary vascular congestion is present. There are no effusions. No focal airspace consolidation is present. IMPRESSION: 1. Borderline cardiomegaly and mild pulmonary vascular congestion. Electronically Signed   By: Marin Roberts M.D.   On: 11/15/2015 10:52    Other results: EKG: Sinus rhythm, left atrial enlargement, mild QT prolongation  Assessment & Plan by Problem: Dyspnea on exertion/acute congestive heart failure: Improving while in hospital. Vascular congestion concerning for acute congestive heart failure in patient with known HTN and bicuspid aortic valve. However on our exam lung fields were entirely clear. Last echo 03/2015. BNP elevation up to 1075 from 239 a year ago. Not sure if acute systolic congestive heart failure, fluid up from indomethacin AKI, or his left sided heart hypertrophy resulting from severe aortic regurgitation. -TTE -Cardiac monitoring  AKI: SCr 1.8 up from previous value 1.03 seen at ED on 3/8. Maybe heart failure, maybe hypovolemia from vomiting, maybe response to indomethacin. -Hold diuretics for  today -F/U AM chemistry -Urine/serum Na/Cr  Abdominal and chest pain: Some concern for ulcer in setting of GERD, NSAIDS, glucocorticoids. No frank bleeding, no diarrhea, no vomiting, pain is now improved. PTA meds review also positive for naproxen and ibuprofen. -Protonix 80mg  daily  HTN: Elevated BP 140s-170s. Maybe contributor to heart failure, vascular congestion, SOB. Points against hypovolemia, GI bleeding concerns. -hold home lisinopril, HCTZ for AKI -atenolol 100mg   T2DM: On home metformin 500mg  BID. -CBG monitoring -+SSI-S  Gout: Improved since ED visit 3/8. -Allopurinol 300mg  -Oxycodone 5-325 q6hrs  Diet: Carb mod VTE ppx:  enoxaparin FULL CODE  Dispo: Disposition is deferred at this time, awaiting improvement of current medical problems. Anticipated discharge in approximately 1-2 day(s).   The patient does have a current PCP (Quentin Angst, MD) and does need an [facility] hospital follow-up appointment after discharge.  The patient does not have transportation limitations that hinder transportation to clinic appointments.  Signed: Fuller Plan, MD 11/15/2015, 8:11 PM

## 2015-11-15 NOTE — ED Provider Notes (Signed)
CSN: [id number]     Arrival date & time 11/15/15  4098 History   First MD Initiated Contact with Patient 11/15/15 0919     Chief Complaint  Patient presents with  . Chest Pain     (Consider location/radiation/quality/duration/timing/severity/associated sxs/prior Treatment) HPI   Since very pleasant 55 year old male with history of hypertension, type 2 diabetes, GERD, gout. Patient recently treated in the emergency room for gout flare in his left knee. At that time he was started on indomethacin and prednisone. In additionally he was restarted on to his home hypertension medications: Lisinopril and atenolol.  Since he started on indomethacin and prednisone he's had multiple symptoms. He reports that the indomethacin makes him feel nauseated, vomits occasionally. Patient is able to take adequate by mouth. In addition patient reports occasional shortness of breath and chest pain since starting indomethacin.  In addition he's been having trouble sleeping, feels activated and anxious.  Patient has been urinating and defecating normally. He says occasionally he feels like he wants to go and can't, but is always able to end up going.  Patient has a primary care physician but has not been able to see him recently.  Past Medical History  Diagnosis Date  . Hypertension   . Hypercholesteremia   . Baker's cyst of knee   . Type II diabetes mellitus (HCC)   . GERD (gastroesophageal reflux disease)   . Gouty arthritis     "real bad" (01/17/2013)   Past Surgical History  Procedure Laterality Date  . Finger amputation Right 1980's    3rd & 4th digits "got them mashed" (01/17/2013)  . Knee arthroscopy Right 1980's   Family History  Problem Relation Age of Onset  . Diabetes Father   . Hypertension Mother   . Hypertension Father   . Hypertension Sister   . Hypertension Brother   . Hypertension Brother   . Hypertension Brother    Social History  Substance Use Topics  . Smoking status: Never  Smoker   . Smokeless tobacco: Never Used  . Alcohol Use: 1.8 oz/week    3 Shots of liquor per week     Comment: ocassional     Review of Systems  Constitutional: Positive for activity change. Negative for fever and fatigue.  HENT: Negative for congestion.   Respiratory: Positive for shortness of breath.   Cardiovascular: Positive for chest pain.  Gastrointestinal: Positive for nausea and vomiting.  Genitourinary: Positive for difficulty urinating.  Musculoskeletal: Negative for gait problem.  Skin: Negative for pallor and rash.  Psychiatric/Behavioral: Negative for agitation.      Allergies  Other and Hydrocodone-acetaminophen  Home Medications   Prior to Admission medications   Medication Sig Start Date End Date Taking? Authorizing Provider  acetaminophen (TYLENOL) 325 MG tablet Take 650 mg by mouth every 6 (six) hours as needed (for pain.).   Yes Historical Provider, MD  allopurinol (ZYLOPRIM) 300 MG tablet Take 1 tablet (300 mg total) by mouth daily. 05/27/15  Yes Quentin Angst, MD  atenolol (TENORMIN) 100 MG tablet Take 1 tablet (100 mg total) by mouth daily. 11/12/15  Yes Danelle Berry, PA-C  azelastine (ASTELIN) 0.1 % nasal spray Place 1 spray into both nostrils every other day. Use in each nostril as directed   Yes Historical Provider, MD  colchicine 0.6 MG tablet Take 1 tablet (0.6 mg total) by mouth 2 (two) times daily. For gout pain 05/27/15  Yes Quentin Angst, MD  esomeprazole (NEXIUM) 40 MG capsule Take 1  capsule (40 mg total) by mouth daily. 05/27/15  Yes Quentin Angst, MD  hydrochlorothiazide (HYDRODIURIL) 25 MG tablet Take 1 tablet (25 mg total) by mouth daily. 11/12/15  Yes Danelle Berry, PA-C  ibuprofen (ADVIL,MOTRIN) 200 MG tablet Take 200 mg by mouth every 6 (six) hours as needed (pain.).   Yes Historical Provider, MD  indomethacin (INDOCIN) 25 MG capsule Take 2 capsules (50 mg total) by mouth 3 (three) times daily as needed. Patient taking differently:  Take 50 mg by mouth 3 (three) times daily as needed for mild pain.  11/12/15  Yes Danelle Berry, PA-C  lisinopril (PRINIVIL,ZESTRIL) 40 MG tablet Take 1 tablet (40 mg total) by mouth daily. 11/12/15  Yes Danelle Berry, PA-C  metFORMIN (GLUCOPHAGE) 500 MG tablet Take 1 tablet (500 mg total) by mouth 2 (two) times daily with a meal. 05/27/15  Yes Quentin Angst, MD  Multiple Vitamin (MULTIVITAMIN WITH MINERALS) TABS tablet Take 1 tablet by mouth daily.   Yes Historical Provider, MD  naproxen sodium (ANAPROX) 220 MG tablet Take 440 mg by mouth daily as needed (pain.).    Yes Historical Provider, MD  Omega-3 Fatty Acids (FISH OIL) 1000 MG CAPS Take 1,000 mg by mouth daily.   Yes Historical Provider, MD  oxyCODONE-acetaminophen (PERCOCET/ROXICET) 5-325 MG tablet Take 1-2 tablets by mouth every 6 (six) hours as needed for severe pain. Patient taking differently: Take 1 tablet by mouth 2 (two) times daily as needed for severe pain.  11/12/15  Yes Danelle Berry, PA-C  pravastatin (PRAVACHOL) 20 MG tablet Take 1 tablet (20 mg total) by mouth at bedtime. 05/27/15  Yes Quentin Angst, MD  predniSONE (DELTASONE) 20 MG tablet Take 2 tablets (40 mg total) by mouth daily. 11/12/15  Yes Danelle Berry, PA-C  sildenafil (VIAGRA) 100 MG tablet Take 0.5 tablets (50 mg total) by mouth as needed for erectile dysfunction. 05/27/15   Quentin Angst, MD   BP 157/83 mmHg  Pulse 63  Resp 18  SpO2 99% Physical Exam  Constitutional: He is oriented to person, place, and time. He appears well-nourished.  HENT:  Head: Normocephalic.  Mouth/Throat: Oropharynx is clear and moist.  Eyes: Conjunctivae are normal.  Neck: No tracheal deviation present.  Cardiovascular: Normal rate.   Murmur heard. Systolic murmur  Pulmonary/Chest: Effort normal. No stridor. No respiratory distress.  Abdominal: Soft. There is no tenderness. There is no guarding.  Musculoskeletal: Normal range of motion. He exhibits no edema.  Neurological: He is  oriented to person, place, and time. No cranial nerve deficit.  Skin: Skin is warm and dry. No rash noted. He is not diaphoretic.  Psychiatric: He has a normal mood and affect. His behavior is normal.  Nursing note and vitals reviewed.   ED Course  Procedures (including critical care time) Labs Review Labs Reviewed  BASIC METABOLIC PANEL - Abnormal; Notable for the following:    CO2 19 (*)    Glucose, Bld 130 (*)    BUN 45 (*)    Creatinine, Ser 1.80 (*)    GFR calc non Af Amer 40 (*)    GFR calc Af Amer 47 (*)    All other components within normal limits  CBC - Abnormal; Notable for the following:    WBC 10.9 (*)    All other components within normal limits  URINALYSIS, ROUTINE W REFLEX MICROSCOPIC (NOT AT [facility])  I-STAT TROPOININ, ED    Imaging Review No results found. I have personally reviewed and evaluated these  images and lab results as part of my medical decision-making.   EKG Interpretation None      MDM   Final diagnoses:  None    Patient is a 55 year old male with history of diabetes, gout, hypertension presenting today with multiple symptoms since starting taking indomethacin and prednisone. I think his symptoms are likely medication related. His activation and trouble sleeping is likely to the prednisone. His symptoms of nausea and occasional vomiting are likely due to indomethacin. Patient's already taken himself off both these medications in the last day. I will have him continue to keep off these medications. His gout has improved so he does not need the medications currently.   We will make sure that we rule out any other cause of his other symptoms we will get labs, chest x-ray, EKG. Patient has "occasional chest pain" for the last week since he started this medication. I do not suspect that this is ischemic. Patient does have a bicuspid aortic valve and is supposed to receive follow-up for this. We will encourage him to follow-up.  I worry that the  indomethacin may have caused an ulcer. We will start him on 2 week course of omeprazole and have him follow-Up with His Primary Care Physician.  11:21 AM Patient unable to urinate, but bladder scan shows < 100.  So likely he is just not adequetly hydrated.  Patient with occasionaly SOB, but EKG normal, CXR shows mild congestion, PE of lungs normal, vital signs normal. Will ambulate patient.    2:50 PM Paitent very SOB on ambulation.  Not able to ambulate far. Concern for new CHF given aortic bicuspid valve, new exercise intolerance and new mild vascular congestion on xray.  Will discuss with hospitilist  for admission.  Hazelyn Kallen Randall An, MD 11/15/15 1513

## 2015-11-15 NOTE — ED Notes (Signed)
Pt given a large pitcher of water to drink.

## 2015-11-15 NOTE — ED Notes (Signed)
Pt. Developed chest tightness sob. N/v after he started Indocin He started the medication on the  8th and after 6 pills the symptoms began.  He is having increased pain and sob with walking. Pain is non-radiaitng  Having difficulty urinating too. Pt. Is very anxious.   ECG completed in triage Labs completed in triage Pt. Is alert and oriented X4.   Skin is warm and dry

## 2015-11-16 DIAGNOSIS — F1020 Alcohol dependence, uncomplicated: Secondary | ICD-10-CM

## 2015-11-16 DIAGNOSIS — R079 Chest pain, unspecified: Secondary | ICD-10-CM

## 2015-11-16 DIAGNOSIS — R1013 Epigastric pain: Secondary | ICD-10-CM | POA: Diagnosis not present

## 2015-11-16 DIAGNOSIS — N179 Acute kidney failure, unspecified: Secondary | ICD-10-CM | POA: Diagnosis not present

## 2015-11-16 DIAGNOSIS — I10 Essential (primary) hypertension: Secondary | ICD-10-CM

## 2015-11-16 DIAGNOSIS — E1121 Type 2 diabetes mellitus with diabetic nephropathy: Secondary | ICD-10-CM | POA: Diagnosis present

## 2015-11-16 DIAGNOSIS — K921 Melena: Secondary | ICD-10-CM | POA: Diagnosis present

## 2015-11-16 DIAGNOSIS — M109 Gout, unspecified: Secondary | ICD-10-CM | POA: Diagnosis present

## 2015-11-16 DIAGNOSIS — R0609 Other forms of dyspnea: Secondary | ICD-10-CM | POA: Insufficient documentation

## 2015-11-16 DIAGNOSIS — E119 Type 2 diabetes mellitus without complications: Secondary | ICD-10-CM

## 2015-11-16 DIAGNOSIS — Q231 Congenital insufficiency of aortic valve: Secondary | ICD-10-CM | POA: Diagnosis present

## 2015-11-16 LAB — BASIC METABOLIC PANEL
Anion gap: 9 (ref 5–15)
BUN: 36 mg/dL — AB (ref 6–20)
CHLORIDE: 109 mmol/L (ref 101–111)
CO2: 19 mmol/L — AB (ref 22–32)
CREATININE: 1.37 mg/dL — AB (ref 0.61–1.24)
Calcium: 8.8 mg/dL — ABNORMAL LOW (ref 8.9–10.3)
GFR calc Af Amer: >60 mL/min (ref >60)
GFR calc non Af Amer: 56 mL/min — ABNORMAL LOW (ref >60)
GLUCOSE: 110 mg/dL — AB (ref 65–99)
POTASSIUM: 4.6 mmol/L (ref 3.5–5.1)
Sodium: 137 mmol/L (ref 135–145)

## 2015-11-16 LAB — GLUCOSE, CAPILLARY
GLUCOSE-CAPILLARY: 119 mg/dL — AB (ref 65–99)
GLUCOSE-CAPILLARY: 95 mg/dL (ref 65–99)
Glucose-Capillary: 82 mg/dL (ref 65–99)
Glucose-Capillary: 127 mg/dL — ABNORMAL HIGH (ref 65–99)

## 2015-11-16 MED ORDER — ONDANSETRON 4 MG PO TBDP
8.0000 mg | ORAL_TABLET | Freq: Three times a day (TID) | ORAL | Status: DC | PRN
  Administered 2015-11-16: 8 mg via ORAL
  Filled 2015-11-16: qty 2

## 2015-11-16 MED ORDER — PANTOPRAZOLE SODIUM 40 MG PO TBEC
40.0000 mg | DELAYED_RELEASE_TABLET | Freq: Every day | ORAL | Status: DC
  Administered 2015-11-16: 40 mg via ORAL
  Filled 2015-11-16: qty 1

## 2015-11-16 MED ORDER — PANTOPRAZOLE SODIUM 40 MG PO TBEC
40.0000 mg | DELAYED_RELEASE_TABLET | Freq: Two times a day (BID) | ORAL | Status: DC
  Administered 2015-11-16 – 2015-11-18 (×4): 40 mg via ORAL
  Filled 2015-11-16 (×4): qty 1

## 2015-11-16 NOTE — Progress Notes (Signed)
PT informed this RN, patient became severely nauseated at end of walk when returning to his room. Patient noted sitting in a chair leaning over the trash can. Notified Dr. Dimple Casey - stated he would enter orders to address nausea/ possible vomiting.

## 2015-11-16 NOTE — Progress Notes (Signed)
OT Cancellation Note and Discharge  Patient Details Name: [name] MRN: [id number] DOB: May 21, 1961   Cancelled Treatment:    Reason Eval/Treat Not Completed: OT screened, no needs identified, will sign off. Pt was totally independent with fall risk score of "0" with PT, not needs identified by them for OT.  Evette Georges [phone] 11/16/2015, 5:11 PM

## 2015-11-16 NOTE — Evaluation (Signed)
Physical Therapy Evaluation Patient Details Name: [name] MRN: [id number] DOB: 09/06/1961 Today's Date: 11/16/2015   History of Present Illness  Pt adm with dyspnea and acute heart failure. PMH - DM, gout  Clinical Impression  Pt doing well with mobility and no further PT needed.  Ready for dc from PT standpoint.      Follow Up Recommendations No PT follow up    Equipment Recommendations  None recommended by PT    Recommendations for Other Services       Precautions / Restrictions Precautions Precautions: None      Mobility  Bed Mobility Overal bed mobility: Independent                Transfers Overall transfer level: Independent                  Ambulation/Gait Ambulation/Gait assistance: Independent Ambulation Distance (Feet): 200 Feet Assistive device: None Gait Pattern/deviations: WFL(Within Functional Limits)     General Gait Details: Dyspnea 2/4 and SpO2 100% on RA  Stairs            Wheelchair Mobility    Modified Rankin (Stroke Patients Only)       Balance Overall balance assessment: Independent;No apparent balance deficits (not formally assessed)                                           Pertinent Vitals/Pain Pain Assessment: No/denies pain    Home Living Family/patient expects to be discharged to:: Private residence                      Prior Function Level of Independence: Independent               Hand Dominance        Extremity/Trunk Assessment   Upper Extremity Assessment: Overall WFL for tasks assessed           Lower Extremity Assessment: Overall WFL for tasks assessed         Communication   Communication: No difficulties  Cognition Arousal/Alertness: Awake/alert Behavior During Therapy: WFL for tasks assessed/performed Overall Cognitive Status: Within Functional Limits for tasks assessed                      General Comments      Exercises         Assessment/Plan    PT Assessment Patent does not need any further PT services  PT Diagnosis Other (comment) (dyspnea)   PT Problem List    PT Treatment Interventions     PT Goals (Current goals can be found in the Care Plan section) Acute Rehab PT Goals PT Goal Formulation: All assessment and education complete, DC therapy    Frequency     Barriers to discharge        Co-evaluation               End of Session   Activity Tolerance: Patient tolerated treatment well Patient left: in chair Nurse Communication: Mobility status (nausea)    Functional Assessment Tool Used: clinical judgment Functional Limitation: Mobility: Walking and moving around Mobility: Walking and Moving Around Current Status (Z4827): 0 percent impaired, limited or restricted Mobility: Walking and Moving Around Goal Status (M7867): 0 percent impaired, limited or restricted Mobility: Walking and Moving Around Discharge Status (J4492): 0 percent impaired, limited or  restricted    Time: 1324-4010 PT Time Calculation (min) (ACUTE ONLY): 8 min   Charges:   PT Evaluation $PT Eval Low Complexity: 1 Procedure     PT G Codes:   PT G-Codes **NOT FOR INPATIENT CLASS** Functional Assessment Tool Used: clinical judgment Functional Limitation: Mobility: Walking and moving around Mobility: Walking and Moving Around Current Status (U7253): 0 percent impaired, limited or restricted Mobility: Walking and Moving Around Goal Status (G6440): 0 percent impaired, limited or restricted Mobility: Walking and Moving Around Discharge Status [phone]): 0 percent impaired, limited or restricted    [facility] 11/16/2015, 2:44 PM [facility] PT [phone]

## 2015-11-16 NOTE — Progress Notes (Signed)
Subjective: Slept poorly overnight otherwise no complaints. Has been walking around his room without any difficulties. Has been using the bathroom frequently.  Objective: Vital signs in last 24 hours: Filed Vitals:   11/15/15 1630 11/15/15 1658 11/15/15 1948 11/16/15 0615  BP: 161/95 156/99 144/84 158/60  Pulse: 73 67 71 65  Temp:  97.7 F (36.5 C) 97.8 F (36.6 C) 97.5 F (36.4 C)  TempSrc:  Oral Oral Oral  Resp: 22  18 18   Height:  5\' 4"  (1.626 m)    Weight:  104.1 kg (229 lb 8 oz)  101.969 kg (224 lb 12.8 oz)  SpO2: 95% 100% 100% 100%   Weight change:   Intake/Output Summary (Last 24 hours) at 11/16/15 1118 Last data filed at 11/16/15 0900  Gross per 24 hour  Intake    240 ml  Output      0 ml  Net    240 ml   GENERAL- alert, co-operative, NAD HEENT- Atraumatic, oral mucosa appears moist CARDIAC- RRR, murmur audible loudly over RUSB extending to axilla RESP- CTAB, no wheezes or crackles. EXTREMITIES- symmetric, no pedal edema, missing distal phalanges of 3rd and 4th digits on right hand SKIN- Warm, dry, No rash or lesion. PSYCH- Normal mood and affect, appropriate thought content and speech.  Lab Results: Basic Metabolic Panel:  Recent Labs Lab 11/15/15 0853 11/16/15 0427  NA 137 137  K 4.6 4.6  CL 106 109  CO2 19* 19*  GLUCOSE 130* 110*  BUN 45* 36*  CREATININE 1.80* 1.37*  CALCIUM 9.1 8.8*   Liver Function Tests:  Recent Labs Lab 11/12/15 1415  AST 23  ALT 9*  ALKPHOS 66  BILITOT 0.5  PROT 7.0  ALBUMIN 2.9*    CBC:  Recent Labs Lab 11/12/15 1415 11/15/15 0853  WBC 8.8 10.9*  NEUTROABS 5.6  --   HGB 15.3 15.6  HCT 44.0 45.5  MCV 88.7 88.9  PLT 264 351   CBG:  Recent Labs Lab 11/15/15 1658 11/15/15 2140 11/16/15 0613  GLUCAP 110* 124* 119*   Thyroid Function Tests:  Recent Labs Lab 11/15/15 1704  TSH 3.027   Urine Drug Screen: Drugs of Abuse   Alcohol Level: No results for input(s): ETH in the last 168  hours. Urinalysis:  Recent Labs Lab 11/15/15 1500  COLORURINE YELLOW  LABSPEC 1.019  PHURINE 5.5  GLUCOSEU NEGATIVE  HGBUR SMALL*  BILIRUBINUR NEGATIVE  KETONESUR NEGATIVE  PROTEINUR 100*  NITRITE NEGATIVE  LEUKOCYTESUR NEGATIVE     Micro Results: No results found for this or any previous visit (from the past 240 hour(s)). Studies/Results: Dg Chest 2 View  11/15/2015  CLINICAL DATA:  Chest pain. Nausea and difficulty breathing with new medication. EXAM: CHEST - 2 VIEW COMPARISON:  Chest and right rib radiographs 11/29/2014. FINDINGS: Mild cardiac enlargement is exaggerated by low lung volumes. Moderate pulmonary vascular congestion is present. There are no effusions. No focal airspace consolidation is present. IMPRESSION: 1. Borderline cardiomegaly and mild pulmonary vascular congestion. Electronically Signed   By: Marin Roberts M.D.   On: 11/15/2015 10:52   Medications: I have reviewed the patient's current medications. Scheduled Meds: . allopurinol  300 mg Oral Daily  . atenolol  100 mg Oral Daily  . azelastine  1 spray Each Nare QODAY  . heparin  5,000 Units Subcutaneous 3 times per day  . insulin aspart  0-9 Units Subcutaneous TID WC  . multivitamin with minerals  1 tablet Oral Daily  . pantoprazole  80 mg Oral Q1200  . pravastatin  20 mg Oral QHS   Continuous Infusions:  PRN Meds:.oxyCODONE-acetaminophen Assessment/Plan: Dyspnea on exertion/acute congestive heart failure: Heart failure could have multiple exacerbating factors, worsened hypertension with him being off atenolol and lisinopril temporarily plus being on glucocorticoids and NSAIDs. Could be progressive dysfunction of his aortic valve. His symptoms are much improved now with no active complaints. -Cardiac monitoring -PT eval and treat -ECHO  AKI: SCr 1.8->1.37, still partially up from previous value 1.03 seen at ED on 3/8. Improved with some fluids. FeNa 0.5% consistent with pre-renal AKI. Mixed  picture of intravascular depletion/hypoperfusion despite extravascular edema. -Continue adding PTA regimen  Abdominal and chest pain: No signs or symptoms at this time. Some concern for ulcer in setting of GERD, NSAIDS, glucocorticoids. -Protonix  daily  HTN: Elevated BP 140s-170s. Maybe contributing to acute heart failure, pulse pressure ~60 in setting of severe aortic regurgitation on 03/2015 TTE. -holding home lisinopril, HCTZ for AKI -atenolol   T2DM: On home metformin  BID. -CBG monitoring -+SSI-S  Gout: Improved since ED visit 3/8. -Allopurinol  -Oxycodone 5-325 q6hrs  Diet: Carb mod VTE ppx: [place] enoxaparin FULL CODE  Dispo: Disposition is deferred at this time, awaiting improvement of current medical problems. Anticipated discharge in approximately 0-1 day(s).   The patient does have a current PCP (Quentin Angst, MD) and does need an [facility] hospital follow-up appointment after discharge.  The patient does not have transportation limitations that hinder transportation to clinic appointments.    Fuller Plan, MD 11/16/2015, 11:18 AM

## 2015-11-17 ENCOUNTER — Observation Stay (HOSPITAL_BASED_OUTPATIENT_CLINIC_OR_DEPARTMENT_OTHER): Payer: Commercial Managed Care - HMO

## 2015-11-17 DIAGNOSIS — I110 Hypertensive heart disease with heart failure: Secondary | ICD-10-CM | POA: Diagnosis not present

## 2015-11-17 DIAGNOSIS — I351 Nonrheumatic aortic (valve) insufficiency: Secondary | ICD-10-CM

## 2015-11-17 DIAGNOSIS — Z79899 Other long term (current) drug therapy: Secondary | ICD-10-CM

## 2015-11-17 DIAGNOSIS — Z7984 Long term (current) use of oral hypoglycemic drugs: Secondary | ICD-10-CM

## 2015-11-17 DIAGNOSIS — R0609 Other forms of dyspnea: Secondary | ICD-10-CM | POA: Diagnosis not present

## 2015-11-17 DIAGNOSIS — R0600 Dyspnea, unspecified: Secondary | ICD-10-CM

## 2015-11-17 DIAGNOSIS — R112 Nausea with vomiting, unspecified: Secondary | ICD-10-CM

## 2015-11-17 DIAGNOSIS — I509 Heart failure, unspecified: Secondary | ICD-10-CM

## 2015-11-17 LAB — GLUCOSE, CAPILLARY
GLUCOSE-CAPILLARY: 134 mg/dL — AB (ref 65–99)
GLUCOSE-CAPILLARY: 115 mg/dL — AB (ref 65–99)
GLUCOSE-CAPILLARY: 100 mg/dL — AB (ref 65–99)
Glucose-Capillary: 115 mg/dL — ABNORMAL HIGH (ref 65–99)

## 2015-11-17 LAB — ECHOCARDIOGRAM COMPLETE
Height: 64 in
WEIGHTICAEL: 3598.4 [oz_av]

## 2015-11-17 LAB — HIV ANTIBODY (ROUTINE TESTING W REFLEX): HIV Screen 4th Generation wRfx: NONREACTIVE

## 2015-11-17 LAB — HEMOGLOBIN A1C
HEMOGLOBIN A1C: 6.3 % — AB (ref 4.8–5.6)
MEAN PLASMA GLUCOSE: 134 mg/dL

## 2015-11-17 NOTE — Progress Notes (Signed)
*  PRELIMINARY RESULTS* Echocardiogram 2D Echocardiogram has been performed.  Jeryl Columbia 11/17/2015, 10:54 AM

## 2015-11-17 NOTE — Care Management Obs Status (Signed)
MEDICARE OBSERVATION STATUS NOTIFICATION   Patient Details  Name: [name] MRN: [id number] Date of Birth: 12/15/1960   Medicare Observation Status Notification Given:  Yes    Darrold Span, RN 11/17/2015, 11:26 AM

## 2015-11-17 NOTE — Progress Notes (Deleted)
Patient has arrived on unit from Cath lab patient oriented to unit, groin assessed, placed on tele, VS were normal.[name] 6:39 PM

## 2015-11-17 NOTE — Progress Notes (Signed)
  Date: 11/17/2015  Patient name: [name]  Medical record number: [id number]  Date of birth: 14-Nov-1960   This patient's plan of care was discussed with the house staff. Please see their note for complete details. I concur with their findings.  Mr Vessey was comfortable in bed post TTE  He had episode of vomiting with severe dyspnea and chest discomfort with ambulation with PT yesterday.  His TTE shows his Aortic regurgitation  Is now gone from mod to severe to severe.  We are concerned that his profound DOE, with nausea with ambulating--"they walked me too fast" which is new could either be due to his worsening valvular disease but also certainly be due to angina due to undiagnosed CAD  We will ask for formal Cardiology consultation.    Randall Hiss, MD 11/17/2015, 11:42 AM

## 2015-11-17 NOTE — Progress Notes (Signed)
Subjective: Ambulated with physical therapy yesterday at the end of which he felt short of breath and vomited. He slept poorly overnight but same as previously. No further nausea or vomiting besides after walking. TTE performed this morning showing some increased aortic regurgitation with motility, and cardiology consulted for assessment.  Objective: Vital signs in last 24 hours: Filed Vitals:   11/16/15 0615 11/16/15 1942 11/17/15 0521 11/17/15 1200  BP: 158/60 124/75 128/65 164/82  Pulse: 65 66 65 79  Temp: 97.5 F (36.4 C) 97.7 F (36.5 C) 97.8 F (36.6 C)   TempSrc: Oral Oral Oral   Resp: 18 18 18    Height:   5\' 4"  (1.626 m)   Weight: 101.969 kg (224 lb 12.8 oz)  102.014 kg (224 lb 14.4 oz)   SpO2: 100% 98% 100%    Weight change: -2.086 kg (-4 lb 9.6 oz)  Intake/Output Summary (Last 24 hours) at 11/17/15 1311 Last data filed at 11/17/15 0730  Gross per 24 hour  Intake    240 ml  Output      0 ml  Net    240 ml   GENERAL- alert, co-operative, NAD HEENT- Atraumatic, oral mucosa appears moist CARDIAC- RRR, early diastolic murmur audible loudly over RUSB extending to axilla RESP- CTAB, no wheezes or crackles. EXTREMITIES- symmetric, no pedal edema, missing distal phalanges of 3rd and 4th digits on right hand SKIN- Warm, dry, No rash or lesion. PSYCH- Normal mood and affect, appropriate thought content and speech.  Lab Results: Basic Metabolic Panel:  Recent Labs Lab 11/15/15 0853 11/16/15 0427  NA 137 137  K 4.6 4.6  CL 106 109  CO2 19* 19*  GLUCOSE 130* 110*  BUN 45* 36*  CREATININE 1.80* 1.37*  CALCIUM 9.1 8.8*   Liver Function Tests:  Recent Labs Lab 11/12/15 1415  AST 23  ALT 9*  ALKPHOS 66  BILITOT 0.5  PROT 7.0  ALBUMIN 2.9*    CBC:  Recent Labs Lab 11/12/15 1415 11/15/15 0853  WBC 8.8 10.9*  NEUTROABS 5.6  --   HGB 15.3 15.6  HCT 44.0 45.5  MCV 88.7 88.9  PLT 264 351   CBG:  Recent Labs Lab 11/16/15 0613 11/16/15 1127  11/16/15 1637 11/16/15 2104 11/17/15 0604 11/17/15 1140  GLUCAP 119* 82 127* 95 134* 115*   Thyroid Function Tests:  Recent Labs Lab 11/15/15 1704  TSH 3.027   Urinalysis:  Recent Labs Lab 11/15/15 1500  COLORURINE YELLOW  LABSPEC 1.019  PHURINE 5.5  GLUCOSEU NEGATIVE  HGBUR SMALL*  BILIRUBINUR NEGATIVE  KETONESUR NEGATIVE  PROTEINUR 100*  NITRITE NEGATIVE  LEUKOCYTESUR NEGATIVE   Studies/Results: No results found. Medications: I have reviewed the patient's current medications. Scheduled Meds: . allopurinol  300 mg Oral Daily  . atenolol  100 mg Oral Daily  . azelastine  1 spray Each Nare QODAY  . heparin  5,000 Units Subcutaneous 3 times per day  . insulin aspart  0-9 Units Subcutaneous TID WC  . multivitamin with minerals  1 tablet Oral Daily  . pantoprazole  40 mg Oral BID  . pravastatin  20 mg Oral QHS   Continuous Infusions:  PRN Meds:.ondansetron, oxyCODONE-acetaminophen Assessment/Plan: Dyspnea on exertion/acute congestive heart failure: Symptoms are improve but still having dyspnea on exertion that is new. TTE showing some increase in aortic regurgitation unclear significance, normal EF and no focal hypokinesis noted. -Cardiac monitoring -Up ad lib and walk with nursing -Consult to cardiology for assessment and recommendations  AKI: SCr  1.8->1.37, still partially up from previous value 1.03 seen at ED on 3/8. Improved with some fluids despite congestion on CXR. FeNa 0.5% consistent with pre-renal AKI. -Continue adding PTA regimen as tolerating, currently withheld lisinopril, hctz, metformin  Abdominal and chest pain: No signs or symptoms at this time, no abdominal pain or nausea at rest, no bleeding. -Protonix  BID  HTN: Elevated BP 1200s-140s. Maybe contributing to acute heart failure, pulse pressure ~60 in setting of severe aortic regurgitation on 03/2015 TTE. -holding home lisinopril, HCTZ for AKI -atenolol   T2DM: On home metformin   BID. -CBG monitoring -+SSI-S  Gout: Improved since ED visit 3/8. Also some chronic DJD component that maybe what he still feels. -Allopurinol  -Oxycodone 5-325 q6hrs  Diet: Carb mod VTE ppx: [place] enoxaparin FULL CODE  Dispo: Disposition is deferred at this time, awaiting improvement of current medical problems. Anticipated discharge in approximately 0-1 day(s).   The patient does have a current PCP (Quentin Angst, MD) and does need an [facility] hospital follow-up appointment after discharge.  The patient does not have transportation limitations that hinder transportation to clinic appointments.    Fuller Plan, MD 11/17/2015, 1:11 PM

## 2015-11-18 DIAGNOSIS — M47816 Spondylosis without myelopathy or radiculopathy, lumbar region: Secondary | ICD-10-CM

## 2015-11-18 DIAGNOSIS — I509 Heart failure, unspecified: Secondary | ICD-10-CM

## 2015-11-18 DIAGNOSIS — I5031 Acute diastolic (congestive) heart failure: Secondary | ICD-10-CM | POA: Diagnosis not present

## 2015-11-18 DIAGNOSIS — I351 Nonrheumatic aortic (valve) insufficiency: Secondary | ICD-10-CM | POA: Diagnosis not present

## 2015-11-18 DIAGNOSIS — R0609 Other forms of dyspnea: Secondary | ICD-10-CM | POA: Diagnosis not present

## 2015-11-18 DIAGNOSIS — M170 Bilateral primary osteoarthritis of knee: Secondary | ICD-10-CM

## 2015-11-18 DIAGNOSIS — I110 Hypertensive heart disease with heart failure: Secondary | ICD-10-CM | POA: Diagnosis not present

## 2015-11-18 DIAGNOSIS — Q231 Congenital insufficiency of aortic valve: Secondary | ICD-10-CM | POA: Diagnosis not present

## 2015-11-18 DIAGNOSIS — R112 Nausea with vomiting, unspecified: Secondary | ICD-10-CM | POA: Diagnosis not present

## 2015-11-18 LAB — BASIC METABOLIC PANEL
Anion gap: 12 (ref 5–15)
BUN: 25 mg/dL — AB (ref 6–20)
CALCIUM: 9 mg/dL (ref 8.9–10.3)
CO2: 21 mmol/L — ABNORMAL LOW (ref 22–32)
CREATININE: 1.39 mg/dL — AB (ref 0.61–1.24)
Chloride: 108 mmol/L (ref 101–111)
GFR calc Af Amer: >60 mL/min (ref >60)
GFR, EST NON AFRICAN AMERICAN: 55 mL/min — AB (ref >60)
GLUCOSE: 96 mg/dL (ref 65–99)
Potassium: 4.5 mmol/L (ref 3.5–5.1)
SODIUM: 141 mmol/L (ref 135–145)

## 2015-11-18 LAB — GLUCOSE, CAPILLARY
GLUCOSE-CAPILLARY: 120 mg/dL — AB (ref 65–99)
GLUCOSE-CAPILLARY: 89 mg/dL (ref 65–99)
GLUCOSE-CAPILLARY: 111 mg/dL — AB (ref 65–99)
GLUCOSE-CAPILLARY: 113 mg/dL — AB (ref 65–99)

## 2015-11-18 MED ORDER — ASPIRIN 81 MG PO CHEW
81.0000 mg | CHEWABLE_TABLET | ORAL | Status: AC
  Administered 2015-11-19: 81 mg via ORAL
  Filled 2015-11-18: qty 1

## 2015-11-18 MED ORDER — PANTOPRAZOLE SODIUM 40 MG PO TBEC
40.0000 mg | DELAYED_RELEASE_TABLET | Freq: Every day | ORAL | Status: DC
  Administered 2015-11-19 – 2015-11-24 (×5): 40 mg via ORAL
  Filled 2015-11-18 (×5): qty 1

## 2015-11-18 MED ORDER — SODIUM CHLORIDE 0.9% FLUSH: 3.0000 mL | Freq: Two times a day (BID) | INTRAVENOUS | Status: DC

## 2015-11-18 MED ORDER — SODIUM CHLORIDE 0.9% FLUSH: 3.0000 mL | INTRAVENOUS | Status: DC | PRN

## 2015-11-18 MED ORDER — SODIUM CHLORIDE 0.9 % IV SOLN: 250.0000 mL | INTRAVENOUS | Status: DC | PRN

## 2015-11-18 MED ORDER — SODIUM CHLORIDE 0.9 % IV SOLN
INTRAVENOUS | Status: DC
  Administered 2015-11-18: 18:00:00 via INTRAVENOUS

## 2015-11-18 NOTE — Progress Notes (Signed)
  Date: 11/18/2015  Patient name: [name]  Medical record number: [id number]  Date of birth: September 06, 1961   This patient's plan of care was discussed with the house staff. Please see their note for complete details. I concur with their findings.  [name] appears more comfortable today denies having shortness of breath with exertion although he was not ablated by physical therapy yesterday.  His exam is otherwise largely unchanged with the persistence of his murmur still with some lower extremity edema on exam.  We are awaiting formal consultation from Cardiology to advise whether he needs workup for ischemic heart disease and what further steps to take with regards to management of his now severe aortic regurgitation    Randall Hiss, MD 11/18/2015, 11:10 AM

## 2015-11-18 NOTE — Consult Note (Signed)
Reason for Consult:   CHF, severe AR  Requesting Physician: Dr Daiva Eves  Primary Cardiologist New  HPI:   55 y/o AA male with a history of HTN, DM, HLD, and gout. He saw Dr Daleen Squibb in Aug 2016 for evaluation of an asymptomatic murmur. Echo (July 2016) showed preserved LVF, bicuspid AOV, and moderate AR. Recently he had an acute gout attack and was placed on Indocin and steroids. A few days later he was seen in the ED with abdominal pain and noted to be in CHF (BNP 1057 and CXR showed CHF) and acute on chronic renal insufficiency. Echo now shows severe AR and we are asked to comment on further evaluation. He is improved symptomatically after 5 lbs diuresis (I/O actually positive) after a few doses of Lasix.   PMHx:  Past Medical History  Diagnosis Date  . Hypertension   . Hypercholesteremia   . Baker's cyst of knee   . Type II diabetes mellitus (HCC)   . GERD (gastroesophageal reflux disease)   . Gouty arthritis     "real bad" (01/17/2013)    Past Surgical History  Procedure Laterality Date  . Finger amputation Right 1980's    3rd & 4th digits "got them mashed" (01/17/2013)  . Knee arthroscopy Right 1980's    SOCHx:  reports that he has never smoked. He has never used smokeless tobacco. He reports that he drinks about 1.8 oz of alcohol per week. He reports that he uses illicit drugs (Marijuana).Non smoker, occasional ETOH, retired,  his daughter and her family live with him.   FAMHx: Family History  Problem Relation Age of Onset  . Diabetes Father   . Hypertension Mother   . Hypertension Father   . Hypertension Sister   . Hypertension Brother   . Hypertension Brother   . Hypertension Brother     ALLERGIES: Allergies  Allergen Reactions  . Other Anaphylaxis    mushrooms  . Hydrocodone-Acetaminophen Itching    ROS: Review of Systems: General: negative for chills, fever, night sweats or weight changes.  Cardiovascular: negative for chest pain, dyspnea on  exertion, edema, orthopnea, palpitations, paroxysmal nocturnal dyspnea or shortness of breath HEENT: negative for any visual disturbances, blindness, glaucoma Dermatological: negative for rash Respiratory: negative for cough, hemoptysis, or wheezing Urologic: negative for hematuria or dysuria Abdominal: negative for nausea, vomiting, diarrhea, bright red blood per rectum, melena, or hematemesis Neurologic: negative for visual changes, syncope, or dizziness Musculoskeletal: negative for back pain, joint pain, or swelling Psych: cooperative and appropriate All other systems reviewed and are otherwise negative except as noted above.   HOME MEDICATIONS: Prior to Admission medications   Medication Sig Start Date End Date Taking? Authorizing Provider  acetaminophen (TYLENOL) 325 MG tablet Take 650 mg by mouth every 6 (six) hours as needed (for pain.).   Yes Historical Provider, MD  allopurinol (ZYLOPRIM) 300 MG tablet Take 1 tablet (300 mg total) by mouth daily. 05/27/15  Yes Quentin Angst, MD  atenolol (TENORMIN) 100 MG tablet Take 1 tablet (100 mg total) by mouth daily. 11/12/15  Yes Danelle Berry, PA-C  azelastine (ASTELIN) 0.1 % nasal spray Place 1 spray into both nostrils every other day. Use in each nostril as directed   Yes Historical Provider, MD  colchicine 0.6 MG tablet Take 1 tablet (0.6 mg total) by mouth 2 (two) times daily. For gout pain 05/27/15  Yes Quentin Angst, MD  esomeprazole (NEXIUM) 40 MG capsule Take  1 capsule (40 mg total) by mouth daily. 05/27/15  Yes Quentin Angst, MD  hydrochlorothiazide (HYDRODIURIL) 25 MG tablet Take 1 tablet (25 mg total) by mouth daily. 11/12/15  Yes Danelle Berry, PA-C  ibuprofen (ADVIL,MOTRIN) 200 MG tablet Take 200 mg by mouth every 6 (six) hours as needed (pain.).   Yes Historical Provider, MD  indomethacin (INDOCIN) 25 MG capsule Take 2 capsules (50 mg total) by mouth 3 (three) times daily as needed. Patient taking differently: Take 50 mg  by mouth 3 (three) times daily as needed for mild pain.  11/12/15  Yes Danelle Berry, PA-C  lisinopril (PRINIVIL,ZESTRIL) 40 MG tablet Take 1 tablet (40 mg total) by mouth daily. 11/12/15  Yes Danelle Berry, PA-C  metFORMIN (GLUCOPHAGE) 500 MG tablet Take 1 tablet (500 mg total) by mouth 2 (two) times daily with a meal. 05/27/15  Yes Quentin Angst, MD  Multiple Vitamin (MULTIVITAMIN WITH MINERALS) TABS tablet Take 1 tablet by mouth daily.   Yes Historical Provider, MD  naproxen sodium (ANAPROX) 220 MG tablet Take 440 mg by mouth daily as needed (pain.).    Yes Historical Provider, MD  Omega-3 Fatty Acids (FISH OIL) 1000 MG CAPS Take 1,000 mg by mouth daily.   Yes Historical Provider, MD  oxyCODONE-acetaminophen (PERCOCET/ROXICET) 5-325 MG tablet Take 1-2 tablets by mouth every 6 (six) hours as needed for severe pain. Patient taking differently: Take 1 tablet by mouth 2 (two) times daily as needed for severe pain.  11/12/15  Yes Danelle Berry, PA-C  pravastatin (PRAVACHOL) 20 MG tablet Take 1 tablet (20 mg total) by mouth at bedtime. 05/27/15  Yes Quentin Angst, MD  predniSONE (DELTASONE) 20 MG tablet Take 2 tablets (40 mg total) by mouth daily. 11/12/15  Yes Danelle Berry, PA-C  sildenafil (VIAGRA) 100 MG tablet Take 0.5 tablets (50 mg total) by mouth as needed for erectile dysfunction. 05/27/15   Quentin Angst, MD    HOSPITAL MEDICATIONS: I have reviewed the patient's current medications.  VITALS: Blood pressure 131/64, pulse 61, temperature 98.3 F (36.8 C), temperature source Oral, resp. rate 18, height 5\' 4"  (1.626 m), weight 224 lb 13.9 oz (102 kg), SpO2 100 %.  PHYSICAL EXAM: General appearance: alert, cooperative, no distress and moderately obese Neck: no carotid bruit and no JVD Lungs: clear to auscultation bilaterally Heart: regular rate and rhythm and 2/6 systolic murmur and 1-2/6 AI murmur Abdomen: Obese, non tender Extremities: no edema Pulses: 2+ and symmetric Skin: Skin  color, texture, turgor normal. No rashes or lesions Neurologic: Grossly normal  LABS: Results for orders placed or performed during the hospital encounter of 11/15/15 (from the past 24 hour(s))  Glucose, capillary     Status: Abnormal   Collection Time: 11/17/15  4:29 PM  Result Value Ref Range   Glucose-Capillary 115 (H) 65 - 99 mg/dL   Comment 1 Notify RN    Comment 2 Document in Chart   Glucose, capillary     Status: Abnormal   Collection Time: 11/17/15  9:52 PM  Result Value Ref Range   Glucose-Capillary 100 (H) 65 - 99 mg/dL   Comment 1 Notify RN    Comment 2 Document in Chart   Glucose, capillary     Status: Abnormal   Collection Time: 11/18/15  6:53 AM  Result Value Ref Range   Glucose-Capillary 120 (H) 65 - 99 mg/dL   Comment 1 Notify RN    Comment 2 Document in Chart   Glucose, capillary  Status: None   Collection Time: 11/18/15 11:29 AM  Result Value Ref Range   Glucose-Capillary 89 65 - 99 mg/dL   Comment 1 Notify RN    Comment 2 Document in Chart   Basic metabolic panel     Status: Abnormal   Collection Time: 11/18/15 11:39 AM  Result Value Ref Range   Sodium 141 135 - 145 mmol/L   Potassium 4.5 3.5 - 5.1 mmol/L   Chloride 108 101 - 111 mmol/L   CO2 21 (L) 22 - 32 mmol/L   Glucose, Bld 96 65 - 99 mg/dL   BUN 25 (H) 6 - 20 mg/dL   Creatinine, Ser 7.74 (H) 0.61 - 1.24 mg/dL   Calcium 9.0 8.9 - 14.2 mg/dL   GFR calc non Af Amer 55 (L) >60 mL/min   GFR calc Af Amer >60 >60 mL/min   Anion gap 12 5 - 15    EKG:  NSR, NSST changes  IMAGING: CXR 11/15/15 CHEST - 2 VIEW  COMPARISON: Chest and right rib radiographs 11/29/2014.  FINDINGS: Mild cardiac enlargement is exaggerated by low lung volumes. Moderate pulmonary vascular congestion is present. There are no effusions. No focal airspace consolidation is present.  IMPRESSION: 1. Borderline cardiomegaly and mild pulmonary vascular congestion.  Echo: 11/17/15   ------------------------------------------------------------------- Study Conclusions  - Left ventricle: The cavity size was normal. There was mild  concentric hypertrophy. Systolic function was normal. The  estimated ejection fraction was in the range of 55% to 60%. Wall  motion was normal; there were no regional wall motion  abnormalities. Doppler parameters are consistent with high  ventricular filling pressure. - Aortic valve: Bicuspid; mildly thickened, mildly calcified  leaflets. Transvalvular velocity was minimally increased. There  was no stenosis. There was severe regurgitation directed towards  the mitral anterior leaflet. - Mitral valve: There was mild regurgitation. Regurgitation is  present during late diastole secondary to severe aortic  regurgitation. - Left atrium: The atrium was moderately dilated.  Impressions:  - When compared to prior, aortic regurgitation has progressed.    IMPRESSION: Active Problems:   Bicuspid aortic valve   Severe aortic regurgitation   Acute CHF    Essential hypertension   AKI (acute kidney injury) (HCC)   Controlled type 2 diabetes mellitus with diabetic nephropathy, without long-term current use of insulin (HCC)   Dyslipidemia   Abdominal pain, epigastric   Melanotic stools   Acute gouty arthritis   RECOMMENDATION: Will review with MD  Time Spent Directly with Patient: 45 minutes  Corine Shelter, [place]  [phone] beeper 11/18/2015, 3:13 PM    .Attending Note:   The patient was seen and examined.  Agree with assessment and plan as noted above.  Changes made to the above note as needed.  Pt has a bicuspid AV and has had AI for years  - now has severe AI, Went into heart failure while on NSAIDS.  Has diastolic murmur.  Brisk pulses.  i have personally reviewed the echo and he has severe AI.   I think that we should proceed with evaluation in expectation that he will need AVR.  He has 2 teeth that need  to be removed prior to AVR.  Will set up TEE tomorrow  Dental consult Cath as long as creatinine is stable or better.     Vesta Mixer, Montez Hageman., MD, [facility] 11/18/2015, 3:59 PM [address]  [address] Office [phone] Pager [phone]

## 2015-11-18 NOTE — Progress Notes (Addendum)
Subjective: He is feeling less short of breath with walking compared to yesterday. Blood pressure somewhat labile throughout the day but mostly mildly hypertensive.  Objective: Vital signs in last 24 hours: Filed Vitals:   11/17/15 1411 11/17/15 2153 11/18/15 0439 11/18/15 0445  BP: 137/69 153/71 118/55   Pulse: 63 63 70   Temp: 98.2 F (36.8 C) 98.2 F (36.8 C) 98.2 F (36.8 C)   TempSrc: Oral Oral Oral   Resp: Height:      Weight:    102 kg (224 lb 13.9 oz)  SpO2: 100% 100% 100%    Weight change: -0.014 kg (-0.5 oz)  Intake/Output Summary (Last 24 hours) at 11/18/15 1047 Last data filed at 11/18/15 0900  Gross per 24 hour  Intake    960 ml  Output   1254 ml  Net   -294 ml   GENERAL- alert, co-operative, NAD HEENT- Atraumatic, oral mucosa appears moist CARDIAC- RRR, 3/6 early diastolic murmur loudest over RUSB RESP- CTAB, no wheezes or crackles. EXTREMITIES- symmetric, 1+ pedal edema to halfway up shins, missing distal phalanges of 3rd and 4th digits on right hand SKIN- Warm, dry, No rash or lesion. PSYCH- Normal mood and affect, appropriate thought content and speech.  Lab Results: Basic Metabolic Panel:  Recent Labs Lab 11/15/15 0853 11/16/15 0427  NA 137 137  K 4.6 4.6  CL 106 109  CO2 19* 19*  GLUCOSE 130* 110*  BUN 45* 36*  CREATININE 1.80* 1.37*  CALCIUM 9.1 8.8*   Liver Function Tests:  Recent Labs Lab 11/12/15 1415  AST 23  ALT 9*  ALKPHOS 66  BILITOT 0.5  PROT 7.0  ALBUMIN 2.9*    CBC:  Recent Labs Lab 11/12/15 1415 11/15/15 0853  WBC 8.8 10.9*  NEUTROABS 5.6  --   HGB 15.3 15.6  HCT 44.0 45.5  MCV 88.7 88.9  PLT 264 351   CBG:  Recent Labs Lab 11/16/15 2104 11/17/15 0604 11/17/15 1140 11/17/15 1629 11/17/15 2152 11/18/15 0653  GLUCAP 95 134* 115* 115* 100* 120*   Thyroid Function Tests:  Recent Labs Lab 11/15/15 1704  TSH 3.027   Urinalysis:  Recent Labs Lab 11/15/15 1500  COLORURINE YELLOW   LABSPEC 1.019  PHURINE 5.5  GLUCOSEU NEGATIVE  HGBUR SMALL*  BILIRUBINUR NEGATIVE  KETONESUR NEGATIVE  PROTEINUR 100*  NITRITE NEGATIVE  LEUKOCYTESUR NEGATIVE   Studies/Results: No results found. Medications: I have reviewed the patient's current medications. Scheduled Meds: . allopurinol  300 mg Oral Daily  . atenolol  100 mg Oral Daily  . azelastine  1 spray Each Nare QODAY  . heparin  5,000 Units Subcutaneous 3 times per day  . insulin aspart  0-9 Units Subcutaneous TID WC  . multivitamin with minerals  1 tablet Oral Daily  . pantoprazole  40 mg Oral BID  . pravastatin  20 mg Oral QHS   Continuous Infusions:  PRN Meds:.ondansetron, oxyCODONE-acetaminophen Assessment/Plan: Dyspnea on exertion/acute congestive heart failure: Symptoms are improved and having less dyspnea walking than 2 days ago. TTE showed increase in aortic regurgitation unclear significance, normal EF and no focal hypokinesis noted. -Cardiac monitoring -Up ad lib and walk with nursing -Consult to cardiology for assessment and recommendations  AKI: SCr 1.8->1.37, still partially up from previous value 1.03 seen at ED on 3/8. Improved with some fluids despite congestion on CXR. FeNa 0.5% consistent with pre-renal AKI. -Will recheck Bmet and restart some diuretics since worsened HTN off them -Continue  adding PTA regimen as tolerating, currently withheld lisinopril, hctz, metformin  Abdominal and chest pain: No signs or symptoms at this time, no abdominal pain or nausea at rest, no bleeding. -Protonix 40mg  decrease to daily  HTN: Elevated BP 120s-160s. Maybe contributing to acute heart failure, will restart ACE, diuretics as appropriate for renal function. -home lisinopril, HCTZ for AKI -atenolol 100mg   T2DM: On home metformin 500mg  BID currently held with AKI. -CBG monitoring -+SSI-S  Gout, chronic arthritis of lumbar spine and knees: Home meds -Allopurinol 300mg  -Oxycodone 5-325 q6hrs  Diet: Carb  mod VTE ppx: [place] enoxaparin FULL CODE  Dispo: Disposition is deferred at this time, awaiting further workup of current medical problems. Anticipated discharge in approximately 0-1 day(s).   The patient does have a current PCP (Quentin Angst, MD) and does need an [facility] hospital follow-up appointment after discharge.  The patient does not have transportation limitations that hinder transportation to clinic appointments.    Fuller Plan, MD 11/18/2015, 10:47 AM

## 2015-11-19 ENCOUNTER — Encounter (HOSPITAL_COMMUNITY)
Admission: EM | Disposition: A | Discharge status: Home / Self Care | Payer: Self-pay | Source: Home / Self Care | Attending: Surgery

## 2015-11-19 ENCOUNTER — Observation Stay (HOSPITAL_COMMUNITY): Payer: Commercial Managed Care - HMO

## 2015-11-19 ENCOUNTER — Encounter (HOSPITAL_COMMUNITY): Payer: Self-pay | Admitting: Dentistry

## 2015-11-19 DIAGNOSIS — K029 Dental caries, unspecified: Secondary | ICD-10-CM | POA: Diagnosis present

## 2015-11-19 DIAGNOSIS — M4696 Unspecified inflammatory spondylopathy, lumbar region: Secondary | ICD-10-CM | POA: Diagnosis present

## 2015-11-19 DIAGNOSIS — Z89021 Acquired absence of right finger(s): Secondary | ICD-10-CM | POA: Diagnosis not present

## 2015-11-19 DIAGNOSIS — M7989 Other specified soft tissue disorders: Secondary | ICD-10-CM | POA: Diagnosis not present

## 2015-11-19 DIAGNOSIS — I959 Hypotension, unspecified: Secondary | ICD-10-CM | POA: Diagnosis not present

## 2015-11-19 DIAGNOSIS — Q211 Atrial septal defect: Secondary | ICD-10-CM | POA: Diagnosis not present

## 2015-11-19 DIAGNOSIS — M79661 Pain in right lower leg: Secondary | ICD-10-CM | POA: Diagnosis not present

## 2015-11-19 DIAGNOSIS — Z7984 Long term (current) use of oral hypoglycemic drugs: Secondary | ICD-10-CM | POA: Diagnosis not present

## 2015-11-19 DIAGNOSIS — R0609 Other forms of dyspnea: Secondary | ICD-10-CM | POA: Diagnosis not present

## 2015-11-19 DIAGNOSIS — E1121 Type 2 diabetes mellitus with diabetic nephropathy: Secondary | ICD-10-CM | POA: Diagnosis present

## 2015-11-19 DIAGNOSIS — I351 Nonrheumatic aortic (valve) insufficiency: Secondary | ICD-10-CM | POA: Diagnosis not present

## 2015-11-19 DIAGNOSIS — Z888 Allergy status to other drugs, medicaments and biological substances status: Secondary | ICD-10-CM | POA: Diagnosis not present

## 2015-11-19 DIAGNOSIS — M264 Malocclusion, unspecified: Secondary | ICD-10-CM | POA: Diagnosis present

## 2015-11-19 DIAGNOSIS — I10 Essential (primary) hypertension: Secondary | ICD-10-CM | POA: Diagnosis not present

## 2015-11-19 DIAGNOSIS — N179 Acute kidney failure, unspecified: Secondary | ICD-10-CM | POA: Diagnosis not present

## 2015-11-19 DIAGNOSIS — E785 Hyperlipidemia, unspecified: Secondary | ICD-10-CM | POA: Diagnosis present

## 2015-11-19 DIAGNOSIS — T39395A Adverse effect of other nonsteroidal anti-inflammatory drugs [NSAID], initial encounter: Secondary | ICD-10-CM | POA: Diagnosis present

## 2015-11-19 DIAGNOSIS — Z885 Allergy status to narcotic agent status: Secondary | ICD-10-CM | POA: Diagnosis not present

## 2015-11-19 DIAGNOSIS — D62 Acute posthemorrhagic anemia: Secondary | ICD-10-CM | POA: Diagnosis not present

## 2015-11-19 DIAGNOSIS — R0602 Shortness of breath: Secondary | ICD-10-CM | POA: Diagnosis present

## 2015-11-19 DIAGNOSIS — K089 Disorder of teeth and supporting structures, unspecified: Secondary | ICD-10-CM

## 2015-11-19 DIAGNOSIS — I440 Atrioventricular block, first degree: Secondary | ICD-10-CM | POA: Diagnosis not present

## 2015-11-19 DIAGNOSIS — Z833 Family history of diabetes mellitus: Secondary | ICD-10-CM | POA: Diagnosis not present

## 2015-11-19 DIAGNOSIS — F419 Anxiety disorder, unspecified: Secondary | ICD-10-CM | POA: Diagnosis present

## 2015-11-19 DIAGNOSIS — R112 Nausea with vomiting, unspecified: Secondary | ICD-10-CM | POA: Diagnosis present

## 2015-11-19 DIAGNOSIS — Z8249 Family history of ischemic heart disease and other diseases of the circulatory system: Secondary | ICD-10-CM | POA: Diagnosis not present

## 2015-11-19 DIAGNOSIS — D689 Coagulation defect, unspecified: Secondary | ICD-10-CM | POA: Diagnosis not present

## 2015-11-19 DIAGNOSIS — M1000 Idiopathic gout, unspecified site: Secondary | ICD-10-CM | POA: Diagnosis present

## 2015-11-19 DIAGNOSIS — I4891 Unspecified atrial fibrillation: Secondary | ICD-10-CM | POA: Diagnosis not present

## 2015-11-19 DIAGNOSIS — M170 Bilateral primary osteoarthritis of knee: Secondary | ICD-10-CM | POA: Diagnosis present

## 2015-11-19 DIAGNOSIS — I272 Other secondary pulmonary hypertension: Secondary | ICD-10-CM | POA: Diagnosis present

## 2015-11-19 DIAGNOSIS — K045 Chronic apical periodontitis: Secondary | ICD-10-CM | POA: Diagnosis present

## 2015-11-19 DIAGNOSIS — E8770 Fluid overload, unspecified: Secondary | ICD-10-CM | POA: Diagnosis not present

## 2015-11-19 DIAGNOSIS — G4700 Insomnia, unspecified: Secondary | ICD-10-CM | POA: Diagnosis present

## 2015-11-19 DIAGNOSIS — Q231 Congenital insufficiency of aortic valve: Secondary | ICD-10-CM | POA: Diagnosis not present

## 2015-11-19 DIAGNOSIS — E7800 Pure hypercholesterolemia, unspecified: Secondary | ICD-10-CM | POA: Diagnosis present

## 2015-11-19 DIAGNOSIS — I2510 Atherosclerotic heart disease of native coronary artery without angina pectoris: Secondary | ICD-10-CM | POA: Diagnosis present

## 2015-11-19 DIAGNOSIS — K219 Gastro-esophageal reflux disease without esophagitis: Secondary | ICD-10-CM | POA: Diagnosis present

## 2015-11-19 DIAGNOSIS — I110 Hypertensive heart disease with heart failure: Secondary | ICD-10-CM | POA: Diagnosis present

## 2015-11-19 DIAGNOSIS — I5031 Acute diastolic (congestive) heart failure: Secondary | ICD-10-CM | POA: Diagnosis not present

## 2015-11-19 HISTORY — PX: CARDIAC CATHETERIZATION: SHX172

## 2015-11-19 LAB — BASIC METABOLIC PANEL
Anion gap: 10 (ref 5–15)
Anion gap: 11 (ref 5–15)
BUN: 22 mg/dL — ABNORMAL HIGH (ref 6–20)
BUN: 21 mg/dL — ABNORMAL HIGH (ref 6–20)
CO2: 23 mmol/L (ref 22–32)
CO2: 22 mmol/L (ref 22–32)
Calcium: 8.9 mg/dL (ref 8.9–10.3)
Calcium: 9.3 mg/dL (ref 8.9–10.3)
Chloride: 107 mmol/L (ref 101–111)
Chloride: 109 mmol/L (ref 101–111)
Creatinine, Ser: 1.43 mg/dL — ABNORMAL HIGH (ref 0.61–1.24)
Creatinine, Ser: 1.32 mg/dL — ABNORMAL HIGH (ref 0.61–1.24)
GFR calc Af Amer: >60 mL/min (ref >60)
GFR calc Af Amer: >60 mL/min (ref >60)
GFR calc non Af Amer: 53 mL/min — ABNORMAL LOW (ref >60)
GFR calc non Af Amer: 59 mL/min — ABNORMAL LOW (ref >60)
Glucose, Bld: 108 mg/dL — ABNORMAL HIGH (ref 65–99)
Glucose, Bld: 115 mg/dL — ABNORMAL HIGH (ref 65–99)
Potassium: 4.3 mmol/L (ref 3.5–5.1)
Potassium: 4.3 mmol/L (ref 3.5–5.1)
Sodium: 140 mmol/L (ref 135–145)
Sodium: 142 mmol/L (ref 135–145)

## 2015-11-19 LAB — POCT I-STAT 3, ART BLOOD GAS (G3+)
Acid-base deficit: 11 mmol/L — ABNORMAL HIGH (ref 0.0–2.0)
BICARBONATE: 15.9 meq/L — AB (ref 20.0–24.0)
O2 Saturation: 99 %
PH ART: 7.212 — AB (ref 7.350–7.450)
TCO2: 17 mmol/L (ref 0–100)
pCO2 arterial: 39.4 mmHg (ref 35.0–45.0)
pO2, Arterial: 146 mmHg — ABNORMAL HIGH (ref 80.0–100.0)

## 2015-11-19 LAB — CBC
HCT: 42.4 % (ref 39.0–52.0)
Hemoglobin: 14.2 g/dL (ref 13.0–17.0)
MCH: 30.1 pg (ref 26.0–34.0)
MCHC: 33.5 g/dL (ref 30.0–36.0)
MCV: 89.8 fL (ref 78.0–100.0)
Platelets: 293 10*3/uL (ref 150–400)
RBC: 4.72 MIL/uL (ref 4.22–5.81)
RDW: 14.3 % (ref 11.5–15.5)
WBC: 8.2 10*3/uL (ref 4.0–10.5)

## 2015-11-19 LAB — POCT I-STAT 3, VENOUS BLOOD GAS (G3P V)
Acid-base deficit: 4 mmol/L — ABNORMAL HIGH (ref 0.0–2.0)
Bicarbonate: 23.2 mEq/L (ref 20.0–24.0)
O2 SAT: 54 %
PCO2 VEN: 47.9 mmHg (ref 45.0–50.0)
TCO2: 25 mmol/L (ref 0–100)
pH, Ven: 7.294 (ref 7.250–7.300)
pO2, Ven: 32 mmHg (ref 31.0–45.0)

## 2015-11-19 LAB — GLUCOSE, CAPILLARY
GLUCOSE-CAPILLARY: 91 mg/dL (ref 65–99)
GLUCOSE-CAPILLARY: 117 mg/dL — AB (ref 65–99)
Glucose-Capillary: 121 mg/dL — ABNORMAL HIGH (ref 65–99)
Glucose-Capillary: 92 mg/dL (ref 65–99)

## 2015-11-19 LAB — PROTIME-INR
INR: 1.14 (ref 0.00–1.49)
Prothrombin Time: 14.8 seconds (ref 11.6–15.2)

## 2015-11-19 LAB — POCT ACTIVATED CLOTTING TIME: Activated Clotting Time: 214 seconds

## 2015-11-19 SURGERY — RIGHT/LEFT HEART CATH AND CORONARY ANGIOGRAPHY

## 2015-11-19 MED ORDER — SODIUM CHLORIDE 0.9 % WEIGHT BASED INFUSION: 1.0000 mL/kg/h | INTRAVENOUS | Status: DC

## 2015-11-19 MED ORDER — FENTANYL CITRATE (PF) 100 MCG/2ML IJ SOLN
INTRAMUSCULAR | Status: DC | PRN
  Administered 2015-11-19: 50 ug via INTRAVENOUS

## 2015-11-19 MED ORDER — FENTANYL CITRATE (PF) 100 MCG/2ML IJ SOLN
INTRAMUSCULAR | Status: AC
  Filled 2015-11-19: qty 2

## 2015-11-19 MED ORDER — SODIUM CHLORIDE 0.9 % IV SOLN
INTRAVENOUS | Status: DC | PRN
  Administered 2015-11-19: 5 mL/h via INTRAVENOUS

## 2015-11-19 MED ORDER — LIDOCAINE HCL (PF) 1 % IJ SOLN
INTRAMUSCULAR | Status: DC | PRN
  Administered 2015-11-19 (×2): 2 mL via INTRADERMAL

## 2015-11-19 MED ORDER — LIDOCAINE HCL (PF) 1 % IJ SOLN
INTRAMUSCULAR | Status: AC
  Filled 2015-11-19: qty 30

## 2015-11-19 MED ORDER — ACETAMINOPHEN 325 MG PO TABS
650.0000 mg | ORAL_TABLET | ORAL | Status: DC | PRN
  Administered 2015-11-21 – 2015-11-24 (×2): 650 mg via ORAL
  Filled 2015-11-19 (×2): qty 2

## 2015-11-19 MED ORDER — HEPARIN SODIUM (PORCINE) 5000 UNIT/ML IJ SOLN
5000.0000 [IU] | Freq: Three times a day (TID) | INTRAMUSCULAR | Status: AC
  Administered 2015-11-20 (×2): 5000 [IU] via SUBCUTANEOUS
  Filled 2015-11-19 (×2): qty 1

## 2015-11-19 MED ORDER — SODIUM CHLORIDE 0.9% FLUSH: 3.0000 mL | INTRAVENOUS | Status: DC | PRN

## 2015-11-19 MED ORDER — HEPARIN (PORCINE) IN NACL 2-0.9 UNIT/ML-% IJ SOLN
INTRAMUSCULAR | Status: AC
  Filled 2015-11-19: qty 1000

## 2015-11-19 MED ORDER — ONDANSETRON HCL 4 MG/2ML IJ SOLN
4.0000 mg | Freq: Four times a day (QID) | INTRAMUSCULAR | Status: DC | PRN
  Administered 2015-11-21: 4 mg via INTRAVENOUS

## 2015-11-19 MED ORDER — MIDAZOLAM HCL 2 MG/2ML IJ SOLN
INTRAMUSCULAR | Status: AC
  Filled 2015-11-19: qty 2

## 2015-11-19 MED ORDER — VERAPAMIL HCL 2.5 MG/ML IV SOLN
INTRAVENOUS | Status: AC
  Filled 2015-11-19: qty 2

## 2015-11-19 MED ORDER — SODIUM CHLORIDE 0.9 % IV SOLN
250.0000 mL | INTRAVENOUS | Status: DC | PRN
  Administered 2015-11-25 (×2): via INTRAVENOUS

## 2015-11-19 MED ORDER — SODIUM CHLORIDE 0.9% FLUSH
3.0000 mL | Freq: Two times a day (BID) | INTRAVENOUS | Status: DC
  Administered 2015-11-19 – 2015-11-24 (×9): 3 mL via INTRAVENOUS

## 2015-11-19 MED ORDER — FUROSEMIDE 10 MG/ML IJ SOLN
80.0000 mg | Freq: Two times a day (BID) | INTRAMUSCULAR | Status: DC
  Administered 2015-11-19 – 2015-11-20 (×2): 80 mg via INTRAVENOUS
  Filled 2015-11-19 (×4): qty 8

## 2015-11-19 MED ORDER — MIDAZOLAM HCL 2 MG/2ML IJ SOLN
INTRAMUSCULAR | Status: DC | PRN
  Administered 2015-11-19: 1 mg via INTRAVENOUS

## 2015-11-19 MED ORDER — HEPARIN SODIUM (PORCINE) 1000 UNIT/ML IJ SOLN
INTRAMUSCULAR | Status: AC
  Filled 2015-11-19: qty 1

## 2015-11-19 MED ORDER — HEPARIN SODIUM (PORCINE) 1000 UNIT/ML IJ SOLN
INTRAMUSCULAR | Status: DC | PRN
  Administered 2015-11-19: 5000 [IU] via INTRAVENOUS

## 2015-11-19 MED ORDER — HEPARIN (PORCINE) IN NACL 2-0.9 UNIT/ML-% IJ SOLN
INTRAMUSCULAR | Status: DC | PRN
  Administered 2015-11-19: 1500 mL

## 2015-11-19 MED ORDER — VERAPAMIL HCL 2.5 MG/ML IV SOLN
INTRAVENOUS | Status: DC | PRN
  Administered 2015-11-19: 10 mL via INTRA_ARTERIAL

## 2015-11-19 SURGICAL SUPPLY — 19 items
CATH BALLN WEDGE 5F 110CM (CATHETERS) ×2 IMPLANT
CATH INFINITI 5 FR JL3.5 (CATHETERS) ×2 IMPLANT
CATH INFINITI 5 FR STR PIGTAIL (CATHETERS) ×2 IMPLANT
CATH INFINITI JR4 5F (CATHETERS) ×2 IMPLANT
CATH LAUNCHER 5F EBU3.5 (CATHETERS) ×2 IMPLANT
CATH LAUNCHER 5F RADR (CATHETERS) IMPLANT
CATHETER LAUNCHER 5F RADR (CATHETERS) ×3
DEVICE RAD COMP TR BAND LRG (VASCULAR PRODUCTS) ×3 IMPLANT
GLIDESHEATH SLEND A-KIT 6F 22G (SHEATH) ×2 IMPLANT
GUIDEWIRE .025 260CM (WIRE) ×2 IMPLANT
KIT HEART LEFT (KITS) ×3 IMPLANT
PACK CARDIAC CATHETERIZATION (CUSTOM PROCEDURE TRAY) ×3 IMPLANT
SHEATH FAST CATH BRACH 5F 5CM (SHEATH) ×2 IMPLANT
TRANSDUCER W/STOPCOCK (MISCELLANEOUS) ×6 IMPLANT
TUBING ART PRESS 72  MALE/FEM (TUBING) ×2
TUBING ART PRESS 72 MALE/FEM (TUBING) IMPLANT
TUBING CIL FLEX 10 FLL-RA (TUBING) ×3 IMPLANT
WIRE EMERALD 3MM-J .025X260CM (WIRE) ×2 IMPLANT
WIRE SAFE-T 1.5MM-J .035X260CM (WIRE) ×2 IMPLANT

## 2015-11-19 NOTE — Interval H&P Note (Signed)
History and Physical Interval Note:  11/19/2015 4:24 PM  [name]  has presented today for surgery, with the diagnosis of cp  The various methods of treatment have been discussed with the patient and family. After consideration of risks, benefits and other options for treatment, the patient has consented to  Procedure(s): Right/Left Heart Cath and Coronary Angiography (N/A) as a surgical intervention .  The patient's history has been reviewed, patient examined, no change in status, stable for surgery.  I have reviewed the patient's chart and labs.  Questions were answered to the patient's satisfaction.     Lesleigh Noe

## 2015-11-19 NOTE — H&P (View-Only) (Signed)
Subjective:  No complaints overnight  Objective:  Vital Signs in the last 24 hours: Temp:  [97.9 F (36.6 C)-98.6 F (37 C)] 97.9 F (36.6 C) (03/15 0658) Pulse Rate:  [61-66] 65 (03/15 0658) Resp:  [18] 18 (03/15 0658) BP: (116-157)/(64-78) 116/65 mmHg (03/15 0658) SpO2:  [100 %] 100 % (03/14 1938) Weight:  [224 lb 6.4 oz (101.787 kg)] 224 lb 6.4 oz (101.787 kg) (03/15 0658)  Intake/Output from previous day:  Intake/Output Summary (Last 24 hours) at 11/19/15 0809 Last data filed at 11/18/15 1300  Gross per 24 hour  Intake    360 ml  Output   1402 ml  Net  -1042 ml    Physical Exam: General appearance: alert, cooperative and no distress Lungs: clear to auscultation bilaterally Heart: regular rate and rhythm and 2/6 systolic murmur, 1-6/1 diastolic murmur   Rate: 66  Rhythm: normal sinus rhythm  Lab Results:  Recent Labs  11/19/15 0245  WBC 8.2  HGB 14.2  PLT 293    Recent Labs  11/18/15 1139 11/19/15 0245  NA 141 140  K 4.5 4.3  CL 108 107  CO2 21* 23  GLUCOSE 96 108*  BUN 25* 22*  CREATININE 1.39* 1.43*   No results for input(s): TROPONINI in the last 72 hours.  Invalid input(s): CK, MB  Recent Labs  11/19/15 0245  INR 1.14    Scheduled Meds: . allopurinol  300 mg Oral Daily  . atenolol  100 mg Oral Daily  . azelastine  1 spray Each Nare QODAY  . heparin  5,000 Units Subcutaneous 3 times per day  . insulin aspart  0-9 Units Subcutaneous TID WC  . multivitamin with minerals  1 tablet Oral Daily  . pantoprazole  40 mg Oral Daily  . pravastatin  20 mg Oral QHS  . sodium chloride flush  3 mL Intravenous Q12H   Continuous Infusions: . sodium chloride 75 mL/hr at 11/18/15 1736   PRN Meds:.sodium chloride, ondansetron, oxyCODONE-acetaminophen, sodium chloride flush   Imaging: Imaging results have been reviewed  Cardiac Studies: Echo 11/17/15 Study Conclusions  - Left ventricle: The cavity size was normal. There was mild   concentric hypertrophy. Systolic function was normal. The  estimated ejection fraction was in the range of 55% to 60%. Wall  motion was normal; there were no regional wall motion  abnormalities. Doppler parameters are consistent with high  ventricular filling pressure. - Aortic valve: Bicuspid; mildly thickened, mildly calcified  leaflets. Transvalvular velocity was minimally increased. There  was no stenosis. There was severe regurgitation directed towards  the mitral anterior leaflet. - Mitral valve: There was mild regurgitation. Regurgitation is  present during late diastole secondary to severe aortic  regurgitation. - Left atrium: The atrium was moderately dilated.  Impressions:  - When compared to prior, aortic regurgitation has progressed.  Assessment/Plan:  55 y/o AA male with a history of HTN, DM, HLD, and gout. He has known bicuspid AOV and AI. He recently had CHF and his AI was noted to be severe and we were asked to see him in consult.  Active Problems:   Bicuspid aortic valve   Severe aortic regurgitation   Acute CHF    Essential hypertension   AKI (acute kidney injury) (HCC)   Controlled type 2 diabetes mellitus with diabetic nephropathy, without long-term current use of insulin (HCC)   Dyslipidemia   Abdominal pain, epigastric   Melanotic stools   Acute gouty arthritis   PLAN: SCr is  up a little-cath may need to be put off, will discuss with MD. He is for TEE tomorrow.  Discussed with Dr Tresa Endo- continue IVF, check BMP 11:30am.  Corine Shelter PA-C 11/19/2015, 8:09 AM [phone]  Attending Note:   The patient was seen and examined.  Agree with assessment and plan as noted above.  Changes made to the above note as needed.  Pt will go for cath soon - possibly today  For tee tomorrow   Vesta Mixer, Montez Hageman., MD, [facility] 11/19/2015, 11:15 AM [address],  [address] Office [phone] Pager [phone]

## 2015-11-19 NOTE — Progress Notes (Signed)
Cath lab called to notify they are going to come pick pt up for procedure.  Tele removed, CCMD notified.

## 2015-11-19 NOTE — Consult Note (Signed)
DENTAL CONSULTATION  Date of Consultation:  11/19/2015 Patient Name:   [name] Date of Birth:   04/16/1961 Medical Record Number: [id number]  VITALS: BP 116/65 mmHg  Pulse 65  Temp(Src) 97.9 F (36.6 C) (Oral)  Resp 18  Ht  (1.626 m)  Wt 224 lb 6.4 oz (101.787 kg)  BMI 38.50 kg/m2  SpO2 100%  CHIEF COMPLAINT: Patient was referred by Dr. Kristeen Miss for a dental consultation.  HPI: [name] is a 55 year old male recently diagnosed with severe aortic regurgitation. Patient with anticipated aortic valve replacement. Patient is now seen as part of a medically necessary pre-heart valve surgery dental protocol examination.  The patient currently denies acute toothaches, swellings, or abscesses. The patient was last seen by his dentist "many, many years ago.  This was at lest 6-7 years ago by his report. Patient had several teeth pulled at that time without complications. Patient denies having a primary dentist. Patient does not seek regular dental care. Patient denies having any partial dentures. Patient knows that he has "at least 2 teeth that need to come out". Patient denies dental phobia, but wants to be "put to sleep" to have the extraction procedures performed.  PROBLEM LIST: Patient Active Problem List   Diagnosis Date Noted  . Acute CHF  11/18/2015  . Severe aortic regurgitation   . Abdominal pain, epigastric   . DOE (dyspnea on exertion)   . Bicuspid aortic valve   . AKI (acute kidney injury) (HCC)   . Controlled type 2 diabetes mellitus with diabetic nephropathy, without long-term current use of insulin (HCC)   . Melanotic stools   . Acute gouty arthritis   . SOB (shortness of breath) on exertion 11/15/2015  . Dyspnea 11/15/2015  . Aortic regurgitation due to bicuspid aortic valve 04/16/2015  . Essential hypertension 02/24/2015  . Heart murmur 02/24/2015  . Dyslipidemia 02/24/2015  . Diabetes (HCC) 05/02/2013  . Carbuncle and furuncle of trunk 05/02/2013   . Type I (juvenile type) diabetes mellitus with peripheral circulatory disorders, not stated as uncontrolled(250.71) 05/02/2013  . GERD (gastroesophageal reflux disease) 05/02/2013  . Gout 05/02/2013  . Acute kidney injury (HCC) 01/17/2013  . Gallstones 01/16/2013  . Diarrhea 01/16/2013  . ALLERGIC RHINITIS 07/09/2010  . Gout flare 10/14/2009  . SEBACEOUS CYST 10/01/2009  . FLANK PAIN, LEFT 12/10/2008  . KNEE PAIN, RIGHT 11/28/2008  . HYPERURICEMIA 07/09/2008  . OBESITY 07/08/2008  . LEG PAIN, LEFT 07/08/2008  . CONSTIPATION 08/01/2007  . ERECTILE DYSFUNCTION 06/29/2007  . SHOULDER PAIN, BILATERAL 05/30/2007  . HYPERCHOLESTEROLEMIA 05/16/2007  . VISUAL ACUITY, DECREASED 05/16/2007  . BURSITIS, RIGHT SHOULDER 05/16/2007  . HYPERTENSION 05/15/2007  . GERD 05/15/2007  . ARTHROSCOPY, KNEE, HX OF 05/15/2007    PMH: Past Medical History  Diagnosis Date  . Hypertension   . Hypercholesteremia   . Baker's cyst of knee   . Type II diabetes mellitus (HCC)   . GERD (gastroesophageal reflux disease)   . Gouty arthritis     "real bad" (01/17/2013)    PSH: Past Surgical History  Procedure Laterality Date  . Finger amputation Right 1980's    3rd & 4th digits "got them mashed" (01/17/2013)  . Knee arthroscopy Right 1980's    ALLERGIES: Allergies  Allergen Reactions  . Other Anaphylaxis    mushrooms  . Hydrocodone-Acetaminophen Itching    MEDICATIONS: Current Facility-Administered Medications  Medication Dose Route Frequency Provider Last Rate Last Dose  . 0.9 %  sodium chloride infusion  250 mL  Intravenous PRN Eda Paschal Kilroy, PA-C      . 0.9 %  sodium chloride infusion   Intravenous Continuous Abelino Derrick, PA-C 75 mL/hr at 11/18/15 1736    . allopurinol (ZYLOPRIM) tablet 300 mg  300 mg Oral Daily Denton Brick, MD   300 mg at 11/18/15 0951  . atenolol (TENORMIN) tablet 100 mg  100 mg Oral Daily Denton Brick, MD   100 mg at 11/18/15 0950  . azelastine (ASTELIN) 0.1 %  nasal spray 1 spray  1 spray Each Nare QODAY Randall Hiss, MD   1 spray at 11/18/15 0951  . heparin injection 5,000 Units  5,000 Units Subcutaneous 3 times per day Denton Brick, MD   5,000 Units at 11/19/15 0539  . insulin aspart (novoLOG) injection 0-9 Units  0-9 Units Subcutaneous TID WC Denton Brick, MD   1 Units at 11/17/15 0800  . multivitamin with minerals tablet 1 tablet  1 tablet Oral Daily Randall Hiss, MD   1 tablet at 11/18/15 [phone]  . ondansetron (ZOFRAN-ODT) disintegrating tablet 8 mg  8 mg Oral Q8H PRN Fuller Plan, MD   8 mg at 11/16/15 1553  . oxyCODONE-acetaminophen (PERCOCET/ROXICET) 5-325 MG per tablet 1-2 tablet  1-2 tablet Oral Q6H PRN Denton Brick, MD      . pantoprazole (PROTONIX) EC tablet 40 mg  40 mg Oral Daily Fuller Plan, MD      . pravastatin (PRAVACHOL) tablet 20 mg  20 mg Oral QHS Denton Brick, MD   20 mg at 11/18/15 2208  . sodium chloride flush (NS) 0.9 % injection 3 mL  3 mL Intravenous Q12H Luke K Kilroy, PA-C      . sodium chloride flush (NS) 0.9 % injection 3 mL  3 mL Intravenous PRN Abelino Derrick, PA-C        LABS: Lab Results  Component Value Date   WBC 8.2 11/19/2015   HGB 14.2 11/19/2015   HCT 42.4 11/19/2015   MCV 89.8 11/19/2015   PLT 293 11/19/2015      Component Value Date/Time   NA 140 11/19/2015 0245   K 4.3 11/19/2015 0245   CL 107 11/19/2015 0245   CO2 23 11/19/2015 0245   GLUCOSE 108* 11/19/2015 0245   BUN 22* 11/19/2015 0245   CREATININE 1.43* 11/19/2015 0245   CREATININE 1.05 02/24/2015 1601   CALCIUM 8.9 11/19/2015 0245   GFRNONAA 53* 11/19/2015 0245   GFRNONAA 80 02/24/2015 1601   GFRAA >60 11/19/2015 0245   GFRAA >89 02/24/2015 1601   Lab Results  Component Value Date   INR 1.14 11/19/2015   INR 1.02 05/27/2010   No results found for: PTT  SOCIAL HISTORY: Social History   Social History  . Marital Status: Widowed    Spouse Name: N/A  . Number of Children: N/A  . Years of  Education: N/A   Occupational History  . Not on file.   Social History Main Topics  . Smoking status: Never Smoker   . Smokeless tobacco: Never Used  . Alcohol Use: 1.8 oz/week    3 Shots of liquor per week     Comment: ocassional   . Drug Use: Yes    Special: Marijuana     Comment: last weed was 1 week ago. 04/16/15  . Sexual Activity: Yes   Other Topics Concern  . Not on file   Social History Narrative    FAMILY HISTORY:  Family History  Problem Relation Age of Onset  . Diabetes Father   . Hypertension Mother   . Hypertension Father   . Hypertension Sister   . Hypertension Brother   . Hypertension Brother   . Hypertension Brother     REVIEW OF SYSTEMS: ROS from Dr. Harvie Bridge note of 11/18/2015 was reviewed with the patient and changes noted in bold. General: negative for chills, fever, night sweats or weight changes.  Cardiovascular: negative for chest pain, dyspnea on exertion, edema, orthopnea, palpitations, paroxysmal nocturnal dyspnea. HEENT: negative for any visual disturbances, blindness, glaucoma, or toothaches. Dermatological: negative for rash Respiratory: negative for cough, hemoptysis, or wheezing Urologic: negative for hematuria or dysuria Abdominal: negative for nausea, vomiting, diarrhea, bright red blood per rectum, melena, or hematemesis Neurologic: negative for visual changes, syncope, or dizziness Musculoskeletal: negative for back pain, joint pain, or swelling Psych: Denies dental phobia  All other systems reviewed and are otherwise negative except as noted above.  DENTAL HISTORY: CHIEF COMPLAINT: Patient was referred by Dr. Kristeen Miss for a dental consultation.  HPI: [name] is a 55 year old male recently diagnosed with severe aortic regurgitation. Patient with anticipated aortic valve replacement. Patient is now seen as part of a medically necessary pre-heart valve surgery dental protocol examination.  The patient currently denies  acute toothaches, swellings, or abscesses. The patient was last seen by his dentist "many, many years ago.  This was at lest 6-7 years ago by his report. Patient had several teeth pulled at that time without complications. Patient denies having a primary dentist. Patient does not seek regular dental care. Patient denies having any partial dentures. Patient knows that he has "at least 2 teeth that need to come out". Patient denies dental phobia, but wants to be "put to sleep" to have the extraction procedures performed.   DENTAL EXAMINATION: GENERAL: The patient is a well-developed, well-nourished male in no acute distress. HEAD AND NECK: No palpable neck lymphadenopathy. No thyroid masses. No acute TMJ symptoms. INTRAORAL EXAM: Normal saliva. No abscess formation is seen. Patient has bilateral, multilobular mandibular lingual tori. DENTITION: Multiple missing teeth numbers 3, 19, and 30. There are retained roots in the area tooth numbers 2 and 12. PERIODONTAL: Chronic periodontitis with significant accretions, gingival recession, and incipient tooth mobility. DENTAL CARIES/SUBOPTIMAL RESTORATIONS: Dental caries are noted to be affecting the retained root segments. I will need a full series of dental radiographs to rule out incipient dental caries. ENDODONTIC: No acute pulpitis symptoms. Patient has periapical pathology associated with the apices of tooth #2. CROWN AND BRIDGE: No crown or bridge restorations are noted. PROSTHODONTIC: No history of partial dentures. OCCLUSION: Poor occlusal scheme secondary to multiple missing teeth, multiple retained root segments, supra-eruption and drifting of the unopposed teeth into the edentulous areas, and lack of replacement of missing teeth with dental prostheses.   RADIOGRAPHIC INTERPRETATION: Orthopantogram was taken on 11/19/2015.  There are missing tooth numbers 3, 19, and 30. There are retained roots in the area of tooth numbers 2 and 12. Dental caries  are noted. There is a periapical radiolucency associated with the apices of tooth #2. Multiple diastemas are noted. There is supra-eruption and drifting of the unopposed teeth into the edentulous areas. There is incipient to moderate bone loss noted. There are radiopacities involving the mandible consistent with bilateral mandibular lingual tori.   ASSESSMENTS: 1. Severe aortic regurgitation 2. Pre-heart valve surgery dental protocol 3. Chronic apical periodontitis 4. Multiple retained root segments 5. Dental caries 6. Chronic periodontitis  with bone loss 7. Gingival recession 8. Accretions 9. Incipient mandibular anterior tooth mobility 10. Multiple missing teeth 11. Multiple diastemas 12. Supra-eruption and drifting of the unopposed teeth into the edentulous areas. 12. Poor occlusal scheme and malocclusion 13. Risk for bleeding with anticipated invasive dental procedures 14. Risk for complications up to and including death with anticipated invasive dental procedures in the operating room general anesthesia due to overall cardiovascular compromise   PLAN/RECOMMENDATIONS: 1. I discussed the risks, benefits, and complications of various treatment options with the patient in relationship to his medical and dental conditions, anticipated heart valve surgery, and risk for endocarditis. We discussed various treatment options to include no treatment, multiple extractions with alveoloplasty, pre-prosthetic surgery as indicated, periodontal therapy, dental restorations, root canal therapy, crown and bridge therapy, implant therapy, and replacement of missing teeth as indicated. The patient currently wishes to proceed with extraction of indicated teeth with alveoloplasty and gross debridement of remaining dentition in the operating room with general anesthesia. I have scheduled the surgery for Friday morning at 7:30 AM at  [facility]. The patient will then proceed with heart valve surgery at the  discretion of the cardiothoracic surgeon.    2. Discussion of findings with medical team and coordination of future medical and dental care as needed. We will need to coordinate dental procedures with the pending cardiac catheterization with need for heparin discontinuation as indicated.    Charlynne Pander, DDS

## 2015-11-19 NOTE — Progress Notes (Signed)
  Subjective:  No complaints overnight  Objective:  Vital Signs in the last 24 hours: Temp:  [97.9 F (36.6 C)-98.6 F (37 C)] 97.9 F (36.6 C) (03/15 0658) Pulse Rate:  [61-66] 65 (03/15 0658) Resp:  [18] 18 (03/15 0658) BP: (116-157)/(64-78) 116/65 mmHg (03/15 0658) SpO2:  [100 %] 100 % (03/14 1938) Weight:  [224 lb 6.4 oz (101.787 kg)] 224 lb 6.4 oz (101.787 kg) (03/15 0658)  Intake/Output from previous day:  Intake/Output Summary (Last 24 hours) at 11/19/15 0809 Last data filed at 11/18/15 1300  Gross per 24 hour  Intake    360 ml  Output   1402 ml  Net  -1042 ml    Physical Exam: General appearance: alert, cooperative and no distress Lungs: clear to auscultation bilaterally Heart: regular rate and rhythm and 2/6 systolic murmur, 1-2/6 diastolic murmur   Rate: 66  Rhythm: normal sinus rhythm  Lab Results:  Recent Labs  11/19/15 0245  WBC 8.2  HGB 14.2  PLT 293    Recent Labs  11/18/15 1139 11/19/15 0245  NA 141 140  K 4.5 4.3  CL 108 107  CO2 21* 23  GLUCOSE 96 108*  BUN 25* 22*  CREATININE 1.39* 1.43*   No results for input(s): TROPONINI in the last 72 hours.  Invalid input(s): CK, MB  Recent Labs  11/19/15 0245  INR 1.14    Scheduled Meds: . allopurinol  300 mg Oral Daily  . atenolol  100 mg Oral Daily  . azelastine  1 spray Each Nare QODAY  . heparin  5,000 Units Subcutaneous 3 times per day  . insulin aspart  0-9 Units Subcutaneous TID WC  . multivitamin with minerals  1 tablet Oral Daily  . pantoprazole  40 mg Oral Daily  . pravastatin  20 mg Oral QHS  . sodium chloride flush  3 mL Intravenous Q12H   Continuous Infusions: . sodium chloride 75 mL/hr at 11/18/15 1736   PRN Meds:.sodium chloride, ondansetron, oxyCODONE-acetaminophen, sodium chloride flush   Imaging: Imaging results have been reviewed  Cardiac Studies: Echo 11/17/15 Study Conclusions  - Left ventricle: The cavity size was normal. There was mild   concentric hypertrophy. Systolic function was normal. The  estimated ejection fraction was in the range of 55% to 60%. Wall  motion was normal; there were no regional wall motion  abnormalities. Doppler parameters are consistent with high  ventricular filling pressure. - Aortic valve: Bicuspid; mildly thickened, mildly calcified  leaflets. Transvalvular velocity was minimally increased. There  was no stenosis. There was severe regurgitation directed towards  the mitral anterior leaflet. - Mitral valve: There was mild regurgitation. Regurgitation is  present during late diastole secondary to severe aortic  regurgitation. - Left atrium: The atrium was moderately dilated.  Impressions:  - When compared to prior, aortic regurgitation has progressed.  Assessment/Plan:  55 y/o AA male with a history of HTN, DM, HLD, and gout. He has known bicuspid AOV and AI. He recently had CHF and his AI was noted to be severe and we were asked to see him in consult.  Active Problems:   Bicuspid aortic valve   Severe aortic regurgitation   Acute CHF    Essential hypertension   AKI (acute kidney injury) (HCC)   Controlled type 2 diabetes mellitus with diabetic nephropathy, without long-term current use of insulin (HCC)   Dyslipidemia   Abdominal pain, epigastric   Melanotic stools   Acute gouty arthritis   PLAN: SCr is   up a little-cath may need to be put off, will discuss with MD. He is for TEE tomorrow.  Discussed with Dr Tresa Endo- continue IVF, check BMP 11:30am.  Corine Shelter PA-C 11/19/2015, 8:09 AM [phone]  Attending Note:   The patient was seen and examined.  Agree with assessment and plan as noted above.  Changes made to the above note as needed.  Pt will go for cath soon - possibly today  For tee tomorrow   Vesta Mixer, Montez Hageman., MD, [facility] 11/19/2015, 11:15 AM [address],  [address] Office [phone] Pager [phone]

## 2015-11-19 NOTE — Progress Notes (Addendum)
Subjective: No new events or complaints. Seen in consultation by Cardiology service yesterday PM. NPO in this morning for likely cardiac cath.  Objective: Vital signs in last 24 hours: Filed Vitals:   11/18/15 0445 11/18/15 1319 11/18/15 1938 11/19/15 0658  BP:  131/64 157/78 116/65  Pulse:  61 66 65  Temp:  98.3 F (36.8 C) 98.6 F (37 C) 97.9 F (36.6 C)  TempSrc:  Oral Oral Oral  Resp:  18 18 18   Height:      Weight: 102 kg (224 lb 13.9 oz)   101.787 kg (224 lb 6.4 oz)  SpO2:  100% 100%    Weight change: -0.213 kg (-7.5 oz)  Intake/Output Summary (Last 24 hours) at 11/19/15 0832 Last data filed at 11/18/15 1300  Gross per 24 hour  Intake    360 ml  Output   1402 ml  Net  -1042 ml   GENERAL- alert, co-operative, NAD HEENT- Atraumatic, oral mucosa appears moist CARDIAC- RRR, 3/6 early diastolic murmur loudest over RUSB RESP- CTAB, no wheezes or crackles. EXTREMITIES- symmetric, 1+ pedal edema to halfway up shins, missing distal phalanges of 3rd and 4th digits on right hand SKIN- Warm, dry, No rash or lesion. PSYCH- Normal mood and affect, appropriate thought content and speech.  Lab Results: Basic Metabolic Panel:  Recent Labs Lab 11/18/15 1139 11/19/15 0245  NA 141 140  K 4.5 4.3  CL 108 107  CO2 21* 23  GLUCOSE 96 108*  BUN 25* 22*  CREATININE 1.39* 1.43*  CALCIUM 9.0 8.9   Liver Function Tests:  Recent Labs Lab 11/12/15 1415  AST 23  ALT 9*  ALKPHOS 66  BILITOT 0.5  PROT 7.0  ALBUMIN 2.9*    CBC:  Recent Labs Lab 11/12/15 1415 11/15/15 0853 11/19/15 0245  WBC 8.8 10.9* 8.2  NEUTROABS 5.6  --   --   HGB 15.3 15.6 14.2  HCT 44.0 45.5 42.4  MCV 88.7 88.9 89.8  PLT 264 351 293   CBG:  Recent Labs Lab 11/17/15 2152 11/18/15 0653 11/18/15 1129 11/18/15 1635 11/18/15 2051 11/19/15 0700  GLUCAP 100* 120* 89 111* 113* 121*   Thyroid Function Tests:  Recent Labs Lab 11/15/15 1704  TSH 3.027   Urinalysis:  Recent  Labs Lab 11/15/15 1500  COLORURINE YELLOW  LABSPEC 1.019  PHURINE 5.5  GLUCOSEU NEGATIVE  HGBUR SMALL*  BILIRUBINUR NEGATIVE  KETONESUR NEGATIVE  PROTEINUR 100*  NITRITE NEGATIVE  LEUKOCYTESUR NEGATIVE   Studies/Results: No results found. Medications: I have reviewed the patient's current medications. Scheduled Meds: . allopurinol  300 mg Oral Daily  . atenolol  100 mg Oral Daily  . azelastine  1 spray Each Nare QODAY  . heparin  5,000 Units Subcutaneous 3 times per day  . insulin aspart  0-9 Units Subcutaneous TID WC  . multivitamin with minerals  1 tablet Oral Daily  . pantoprazole  40 mg Oral Daily  . pravastatin  20 mg Oral QHS  . sodium chloride flush  3 mL Intravenous Q12H   Continuous Infusions: . sodium chloride 75 mL/hr at 11/18/15 1736   PRN Meds:.sodium chloride, ondansetron, oxyCODONE-acetaminophen, sodium chloride flush Assessment/Plan: Acute congestive heart failure: New heart failure on indomethacin/prednisone for gout flare with known AI from bicuspid aortic valve. TTE shows now severe regurgitation. -Possibly cardiac cath today -TEE tmrw -Cardiac monitoring -Up ad lib and walk with nursing -Consult to cardiology for assessment and recommendations  AKI: SCr 1.8->1.37->1.43, still partially up from previous value  1.03 seen at ED on 3/8. Volume status appears euvolemic but maybe dry despite holding diuretics. -Bmets -Continue adding PTA regimen as tolerating, currently withheld lisinopril, hctz, metformin  Abdominal and chest pain: Only experiencing on exertion, resolved for 2 days. -Protonix  decrease to daily  HTN: Elevated BP 120s-160s. Maybe contributing to acute heart failure, ACE, diuretics held at present for renal function. -home lisinopril, HCTZ for AKI -atenolol   T2DM: On home metformin  BID currently held with AKI. -CBG monitoring -+SSI-S  Gout, chronic arthritis of lumbar spine and knees: Home meds -Allopurinol   -Oxycodone 5-325 q6hrs  Diet: Carb mod VTE ppx: Fowler enoxaparin FULL CODE  Dispo: Disposition is deferred at this time, awaiting further workup of current medical problems. Anticipated discharge in approximately 1-2 day(s).   The patient does have a current PCP (Quentin Angst, MD) and does need an [facility] hospital follow-up appointment after discharge.  The patient does not have transportation limitations that hinder transportation to clinic appointments.    Fuller Plan, MD 11/19/2015, 8:32 AM   Date: 11/19/2015  Patient name: [name]  Medical record number: [id number]  Date of birth: January 04, 1961   This patient's plan of care was discussed with the house staff. Please see their note for complete details. I concur with their findings.  Greatly appreciate Dr. Harvie Bridge help with this compllicated pt as well as Dr Robin Searing  Patient for cardiac cath today and TEE in am  Renal fxn improved. May benefit from careful hydration post cardiac cath to prevent contrast nephropathy.  Randall Hiss, MD 11/19/2015, 1:34 PM

## 2015-11-19 NOTE — Progress Notes (Signed)
SCr down to 1.32- will proceed with cath today, TEE is scheduled for 8 am Thursday with Dr Shirlee Latch.  Corine Shelter PA-C 11/19/2015 1:14 PM

## 2015-11-20 ENCOUNTER — Inpatient Hospital Stay (HOSPITAL_COMMUNITY): Payer: Commercial Managed Care - HMO

## 2015-11-20 ENCOUNTER — Encounter (HOSPITAL_COMMUNITY)
Admission: EM | Disposition: A | Discharge status: Home / Self Care | Payer: Self-pay | Source: Home / Self Care | Attending: Surgery

## 2015-11-20 ENCOUNTER — Other Ambulatory Visit: Payer: Self-pay | Admitting: *Deleted

## 2015-11-20 ENCOUNTER — Encounter (HOSPITAL_COMMUNITY): Payer: Self-pay | Admitting: *Deleted

## 2015-11-20 DIAGNOSIS — I2510 Atherosclerotic heart disease of native coronary artery without angina pectoris: Secondary | ICD-10-CM

## 2015-11-20 DIAGNOSIS — I351 Nonrheumatic aortic (valve) insufficiency: Secondary | ICD-10-CM

## 2015-11-20 DIAGNOSIS — E8770 Fluid overload, unspecified: Secondary | ICD-10-CM

## 2015-11-20 DIAGNOSIS — Z9889 Other specified postprocedural states: Secondary | ICD-10-CM

## 2015-11-20 DIAGNOSIS — I272 Other secondary pulmonary hypertension: Secondary | ICD-10-CM

## 2015-11-20 HISTORY — PX: TEE WITHOUT CARDIOVERSION: SHX5443

## 2015-11-20 LAB — SPIROMETRY WITH GRAPH
FEF 25-75 PRE: 4.58 L/s
FEF 25-75 Post: 0.28 L/sec
FEF2575-%Change-Post: -93 %
FEF2575-%Pred-Post: 11 %
FEF2575-%Pred-Pre: 180 %
FEV1-%CHANGE-POST: -20 %
FEV1-%PRED-POST: 58 %
FEV1-%Pred-Pre: 74 %
FEV1-POST: 1.49 L
FEV1-PRE: 1.88 L
FEV1FVC-%Change-Post: 0 %
FEV1FVC-%Pred-Pre: 116 %
FEV6-%Change-Post: -21 %
FEV6-%PRED-POST: 51 %
FEV6-%PRED-PRE: 65 %
FEV6-POST: 1.6 L
FEV6-PRE: 2.04 L
FEV6FVC-%PRED-POST: 104 %
FEV6FVC-%PRED-PRE: 104 %
FVC-%CHANGE-POST: -21 %
FVC-%Pred-Post: 49 %
FVC-%Pred-Pre: 63 %
FVC-Post: 1.6 L
FVC-Pre: 2.04 L
PRE FEV6/FVC RATIO: 100 %
Post FEV1/FVC ratio: 93 %
Post FEV6/FVC ratio: 100 %
Pre FEV1/FVC ratio: 92 %

## 2015-11-20 LAB — BASIC METABOLIC PANEL
ANION GAP: 11 (ref 5–15)
BUN: 23 mg/dL — ABNORMAL HIGH (ref 6–20)
CALCIUM: 9.2 mg/dL (ref 8.9–10.3)
CHLORIDE: 107 mmol/L (ref 101–111)
CO2: 22 mmol/L (ref 22–32)
Creatinine, Ser: 1.33 mg/dL — ABNORMAL HIGH (ref 0.61–1.24)
GFR calc non Af Amer: 58 mL/min — ABNORMAL LOW (ref >60)
Glucose, Bld: 94 mg/dL (ref 65–99)
Potassium: 4.4 mmol/L (ref 3.5–5.1)
Sodium: 140 mmol/L (ref 135–145)

## 2015-11-20 LAB — GLUCOSE, CAPILLARY
Glucose-Capillary: 116 mg/dL — ABNORMAL HIGH (ref 65–99)
Glucose-Capillary: 92 mg/dL (ref 65–99)
Glucose-Capillary: 157 mg/dL — ABNORMAL HIGH (ref 65–99)
Glucose-Capillary: 105 mg/dL — ABNORMAL HIGH (ref 65–99)

## 2015-11-20 SURGERY — ECHOCARDIOGRAM, TRANSESOPHAGEAL
Anesthesia: Moderate Sedation

## 2015-11-20 MED ORDER — FENTANYL CITRATE (PF) 100 MCG/2ML IJ SOLN
INTRAMUSCULAR | Status: AC
  Filled 2015-11-20: qty 2

## 2015-11-20 MED ORDER — MIDAZOLAM HCL 5 MG/ML IJ SOLN
INTRAMUSCULAR | Status: AC
  Filled 2015-11-20: qty 2

## 2015-11-20 MED ORDER — FENTANYL CITRATE (PF) 100 MCG/2ML IJ SOLN
INTRAMUSCULAR | Status: DC | PRN
  Administered 2015-11-20 (×3): 25 ug via INTRAVENOUS

## 2015-11-20 MED ORDER — DIPHENHYDRAMINE HCL 50 MG/ML IJ SOLN
INTRAMUSCULAR | Status: AC
  Filled 2015-11-20: qty 1

## 2015-11-20 MED ORDER — ASPIRIN EC 81 MG PO TBEC: 81.0000 mg | DELAYED_RELEASE_TABLET | Freq: Every day | ORAL | Status: DC

## 2015-11-20 MED ORDER — MIDAZOLAM HCL 10 MG/2ML IJ SOLN
INTRAMUSCULAR | Status: DC | PRN
  Administered 2015-11-20 (×2): 2 mg via INTRAVENOUS
  Administered 2015-11-20: 1 mg via INTRAVENOUS
  Administered 2015-11-20: 2 mg via INTRAVENOUS

## 2015-11-20 MED ORDER — BUTAMBEN-TETRACAINE-BENZOCAINE 2-2-14 % EX AERO
INHALATION_SPRAY | CUTANEOUS | Status: DC | PRN
  Administered 2015-11-20: 1 via TOPICAL

## 2015-11-20 MED ORDER — ASPIRIN EC 81 MG PO TBEC
81.0000 mg | DELAYED_RELEASE_TABLET | Freq: Every day | ORAL | Status: DC
  Administered 2015-11-22 – 2015-11-24 (×3): 81 mg via ORAL
  Filled 2015-11-20 (×3): qty 1

## 2015-11-20 MED ORDER — DIPHENHYDRAMINE HCL 50 MG/ML IJ SOLN
INTRAMUSCULAR | Status: DC | PRN
  Administered 2015-11-20: 25 mg via INTRAVENOUS

## 2015-11-20 MED ORDER — CEFAZOLIN SODIUM-DEXTROSE 2-3 GM-% IV SOLR: 2.0000 g | INTRAVENOUS | Status: DC

## 2015-11-20 MED ORDER — ALBUTEROL SULFATE (2.5 MG/3ML) 0.083% IN NEBU
2.5000 mg | INHALATION_SOLUTION | Freq: Once | RESPIRATORY_TRACT | Status: AC
  Administered 2015-11-20: 2.5 mg via RESPIRATORY_TRACT

## 2015-11-20 NOTE — Interval H&P Note (Signed)
History and Physical Interval Note:  11/20/2015 8:11 AM  [name]  has presented today for surgery, with the diagnosis of SEVERE MR  The various methods of treatment have been discussed with the patient and family. After consideration of risks, benefits and other options for treatment, the patient has consented to  Procedure(s): TRANSESOPHAGEAL ECHOCARDIOGRAM (TEE) (N/A) as a surgical intervention .  The patient's history has been reviewed, patient examined, no change in status, stable for surgery.  I have reviewed the patient's chart and labs.  Questions were answered to the patient's satisfaction.     [name] [employer]

## 2015-11-20 NOTE — CV Procedure (Signed)
Procedure: TEE  Indication: Aortic insufficiency.   Sedation: Versed 7 mg IV, 75 mcg fentanyl, 75 mg Benadryl  Findings: Please see echo section for full report.  Mildly dilated LV with EF 60%.  Mild LV hypertrophy.  Normal wall motion.  Normal RV size and systolic function.  Trivial TR with peak RV-RA gradient 35 mmHg.  Trivial mitral regurgitation. Bicuspid aortic valve with mean gradient 10 mmHg across the valve (likely due to high flow, did not appear significantly stenosed) and severe regurgitation.  There was holodiastolic flow reversal in the ascending thoracic aorta.  Mild left atrial enlargement, normal right atrium.  There was smoke but no definite thrombus in the left atrial appendage.  There was a small PFO noted by color doppler with crossing of a couple of bubbles (difficult bubble study).  Aortic root dilated to 3.8 cm, normal caliber ascending aorta.    Impression: Severe aortic insufficiency with bicuspid valve.   [name] 11/20/2015 8:47 AM

## 2015-11-20 NOTE — Progress Notes (Signed)
  Echocardiogram Echocardiogram Transesophageal has been performed.  Janalyn Harder 11/20/2015, 9:08 AM

## 2015-11-20 NOTE — Consult Note (Signed)
[address]       [place], [address]             [phone]      Cardiothoracic Surgery Consultation  Reason for Consult: Bicuspid aortic valve and severe aortic insufficiency with left main and severe multi-vessel coronary artery disease Referring Physician: Dr. Mertie Moores  [name] is an 55 y.o. male.  HPI:   The patient is a 55 year old gentleman with a history of hypertension, hypercholesterolemia, type 2 DM, heart murmur and gout who does not see a PCP regularly. He was seen in the ER on 3/8 with what was felt to be an acute gout flare involving the left knee and lower leg. He was treated with prednisone and Indocin. He reports that after starting these drugs he developed severe abdominal pain and vomiting, shortness of breath and chest pain. He stopped the medications but continued to have abdominal and chest pain with shortness of breath. He returned to the ER on 3/11 and a CXR showed some vascular congestion. Troponin was negative. Creat was elevated to 1.8 from his normal of 1.0. He was admitted and hydrated. He had an echo on 3/13 that showed a bicuspid aortic valve with mildly thickened and calcified leaflets with severe AI. LV function was normal. Compared to an echo in 03/2015 his AI had progressed. It was moderate to severe in July. LV internal dimensions are about the same as they were in July and within normal limits. He underwent cardiac cath on 3/15 showing 50% LM and severe multivessel coronary disease with severe pulmonary HTN at 103/53. LVEDP was 46 mm Hg. He underwent TEE on 11/20/2015 showing a mildly dilated LV with an EF of 60% with a 10 mm Hgb gradient across a bicuspid AV with holodiastolic flow reversal in the ascending aorta. There was a small PFO by color doppler with crossing of a couple bubbles. The aortic root was 3.8 cm with a 3 cm ascending aorta. A CTA in 01/2009 showed a normal caliber thoracic aorta.  Past Medical History    Diagnosis Date  . Hypertension   . Hypercholesteremia   . Baker's cyst of knee   . Type II diabetes mellitus ([place])   . GERD (gastroesophageal reflux disease)   . Gouty arthritis     "real bad" (01/17/2013)    Past Surgical History  Procedure Laterality Date  . Finger amputation Right 1980's    3rd & 4th digits "got them mashed" (01/17/2013)  . Knee arthroscopy Right 1980's  . Cardiac catheterization N/A 11/19/2015    Procedure: Right/Left Heart Cath and Coronary Angiography;  Surgeon: Belva Crome, MD;  Location: [facility];  Service: Cardiovascular;  Laterality: N/A;    Family History  Problem Relation Age of Onset  . Diabetes Father   . Hypertension Mother   . Hypertension Father   . Hypertension Sister   . Hypertension Brother   . Hypertension Brother   . Hypertension Brother     Social History:  reports that he has never smoked. He has never used smokeless tobacco. He reports that he drinks about 1.8 oz of alcohol per week. He reports that he uses illicit drugs (Marijuana).  Allergies:  Allergies  Allergen Reactions  . Other Anaphylaxis    mushrooms  . Hydrocodone-Acetaminophen Itching    Medications:  I have reviewed the patient's current medications. Prior to Admission:  Prescriptions prior to admission  Medication Sig Dispense Refill  Last Dose  . acetaminophen (TYLENOL) 325 MG tablet Take 650 mg by mouth every 6 (six) hours as needed (for pain.).   Past Month at Unknown time  . allopurinol (ZYLOPRIM) 300 MG tablet Take 1 tablet (300 mg total) by mouth daily. 30 tablet 2 11/14/2015 at Unknown time  . atenolol (TENORMIN) 100 MG tablet Take 1 tablet (100 mg total) by mouth daily. 14 tablet 0 11/14/2015 at 0900  . azelastine (ASTELIN) 0.1 % nasal spray Place 1 spray into both nostrils every other day. Use in each nostril as directed   11/14/2015 at Unknown time  . colchicine 0.6 MG tablet Take 1 tablet (0.6 mg total) by mouth 2 (two) times daily. For gout pain  30 tablet 2 11/14/2015 at Unknown time  . esomeprazole (NEXIUM) 40 MG capsule Take 1 capsule (40 mg total) by mouth daily. 30 capsule 2 11/14/2015 at Unknown time  . hydrochlorothiazide (HYDRODIURIL) 25 MG tablet Take 1 tablet (25 mg total) by mouth daily. 14 tablet 0 11/14/2015 at Unknown time  . ibuprofen (ADVIL,MOTRIN) 200 MG tablet Take 200 mg by mouth every 6 (six) hours as needed (pain.).   11/14/2015 at Unknown time  . indomethacin (INDOCIN) 25 MG capsule Take 2 capsules (50 mg total) by mouth 3 (three) times daily as needed. (Patient taking differently: Take 50 mg by mouth 3 (three) times daily as needed for mild pain. ) 30 capsule 0 11/14/2015 at Unknown time  . lisinopril (PRINIVIL,ZESTRIL) 40 MG tablet Take 1 tablet (40 mg total) by mouth daily. 14 tablet 0 11/14/2015 at Unknown time  . metFORMIN (GLUCOPHAGE) 500 MG tablet Take 1 tablet (500 mg total) by mouth 2 (two) times daily with a meal. 60 tablet 2 11/14/2015 at Unknown time  . Multiple Vitamin (MULTIVITAMIN WITH MINERALS) TABS tablet Take 1 tablet by mouth daily.   11/14/2015 at Unknown time  . naproxen sodium (ANAPROX) 220 MG tablet Take 440 mg by mouth daily as needed (pain.).    Past Week at Unknown time  . Omega-3 Fatty Acids (FISH OIL) 1000 MG CAPS Take 1,000 mg by mouth daily.   11/14/2015 at Unknown time  . oxyCODONE-acetaminophen (PERCOCET/ROXICET) 5-325 MG tablet Take 1-2 tablets by mouth every 6 (six) hours as needed for severe pain. (Patient taking differently: Take 1 tablet by mouth 2 (two) times daily as needed for severe pain. ) 20 tablet 0 11/12/2015  . pravastatin (PRAVACHOL) 20 MG tablet Take 1 tablet (20 mg total) by mouth at bedtime. 30 tablet 2 11/14/2015 at Unknown time  . predniSONE (DELTASONE) 20 MG tablet Take 2 tablets (40 mg total) by mouth daily. 10 tablet 0 11/14/2015 at Unknown time  . sildenafil (VIAGRA) 100 MG tablet Take 0.5 tablets (50 mg total) by mouth as needed for erectile dysfunction. 5 tablet 11     Scheduled: . allopurinol  300 mg Oral Daily  . [START ON 11/21/2015] aspirin EC  81 mg Oral Daily  . atenolol  100 mg Oral Daily  . azelastine  1 spray Each Nare QODAY  . furosemide  80 mg Intravenous Q12H  . heparin  5,000 Units Subcutaneous 3 times per day  . insulin aspart  0-9 Units Subcutaneous TID WC  . multivitamin with minerals  1 tablet Oral Daily  . pantoprazole  40 mg Oral Daily  . pravastatin  20 mg Oral QHS  . sodium chloride flush  3 mL Intravenous Q12H   Continuous:  BWI:OMBTDH chloride, acetaminophen, ondansetron (ZOFRAN) IV, ondansetron, oxyCODONE-acetaminophen,  sodium chloride flush  Results for orders placed or performed during the hospital encounter of 11/15/15 (from the past 48 hour(s))  Glucose, capillary     Status: Abnormal   Collection Time: 11/18/15  8:51 PM  Result Value Ref Range   Glucose-Capillary 113 (H) 65 - 99 mg/dL  Basic metabolic panel     Status: Abnormal   Collection Time: 11/19/15  2:45 AM  Result Value Ref Range   Sodium 140 135 - 145 mmol/L   Potassium 4.3 3.5 - 5.1 mmol/L   Chloride 107 101 - 111 mmol/L   CO2 23 22 - 32 mmol/L   Glucose, Bld 108 (H) 65 - 99 mg/dL   BUN 22 (H) 6 - 20 mg/dL   Creatinine, Ser 1.43 (H) 0.61 - 1.24 mg/dL   Calcium 8.9 8.9 - 10.3 mg/dL   GFR calc non Af Amer 53 (L) >60 mL/min   GFR calc Af Amer >60 >60 mL/min    Comment: (NOTE) The eGFR has been calculated using the CKD EPI equation. This calculation has not been validated in all clinical situations. eGFR's persistently <60 mL/min signify possible Chronic Kidney Disease.    Anion gap 10 5 - 15  Protime-INR     Status: None   Collection Time: 11/19/15  2:45 AM  Result Value Ref Range   Prothrombin Time 14.8 11.6 - 15.2 seconds   INR 1.14 0.00 - 1.49  CBC     Status: None   Collection Time: 11/19/15  2:45 AM  Result Value Ref Range   WBC 8.2 4.0 - 10.5 K/uL   RBC 4.72 4.22 - 5.81 MIL/uL   Hemoglobin 14.2 13.0 - 17.0 g/dL   HCT 42.4 39.0 - 52.0 %    MCV 89.8 78.0 - 100.0 fL   MCH 30.1 26.0 - 34.0 pg   MCHC 33.5 30.0 - 36.0 g/dL   RDW 14.3 11.5 - 15.5 %   Platelets 293 150 - 400 K/uL  Glucose, capillary     Status: Abnormal   Collection Time: 11/19/15  7:00 AM  Result Value Ref Range   Glucose-Capillary 121 (H) 65 - 99 mg/dL  Basic metabolic panel     Status: Abnormal   Collection Time: 11/19/15 11:18 AM  Result Value Ref Range   Sodium 142 135 - 145 mmol/L   Potassium 4.3 3.5 - 5.1 mmol/L   Chloride 109 101 - 111 mmol/L   CO2 22 22 - 32 mmol/L   Glucose, Bld 115 (H) 65 - 99 mg/dL   BUN 21 (H) 6 - 20 mg/dL   Creatinine, Ser 1.32 (H) 0.61 - 1.24 mg/dL   Calcium 9.3 8.9 - 10.3 mg/dL   GFR calc non Af Amer 59 (L) >60 mL/min   GFR calc Af Amer >60 >60 mL/min    Comment: (NOTE) The eGFR has been calculated using the CKD EPI equation. This calculation has not been validated in all clinical situations. eGFR's persistently <60 mL/min signify possible Chronic Kidney Disease.    Anion gap 11 5 - 15  Glucose, capillary     Status: None   Collection Time: 11/19/15 11:32 AM  Result Value Ref Range   Glucose-Capillary 92 65 - 99 mg/dL   Comment 1 Notify RN    Comment 2 Document in Chart   Glucose, capillary     Status: None   Collection Time: 11/19/15  4:18 PM  Result Value Ref Range   Glucose-Capillary 91 65 - 99 mg/dL   Comment  1 Notify RN    Comment 2 Document in Chart   I-STAT 3, venous blood gas (G3P V)     Status: Abnormal   Collection Time: 11/19/15  5:24 PM  Result Value Ref Range   pH, Ven 7.294 7.250 - 7.300   pCO2, Ven 47.9 45.0 - 50.0 mmHg   pO2, Ven 32.0 31.0 - 45.0 mmHg   Bicarbonate 23.2 20.0 - 24.0 mEq/L   TCO2 25 0 - 100 mmol/L   O2 Saturation 54.0 %   Acid-base deficit 4.0 (H) 0.0 - 2.0 mmol/L   Patient temperature HIDE    Sample type VENOUS    Comment NOTIFIED PHYSICIAN   I-STAT 3, arterial blood gas (G3+)     Status: Abnormal   Collection Time: 11/19/15  5:41 PM  Result Value Ref Range   pH,  Arterial 7.212 (L) 7.350 - 7.450   pCO2 arterial 39.4 35.0 - 45.0 mmHg   pO2, Arterial 146.0 (H) 80.0 - 100.0 mmHg   Bicarbonate 15.9 (L) 20.0 - 24.0 mEq/L   TCO2 17 0 - 100 mmol/L   O2 Saturation 99.0 %   Acid-base deficit 11.0 (H) 0.0 - 2.0 mmol/L   Patient temperature HIDE    Sample type ARTERIAL   POCT Activated clotting time     Status: None   Collection Time: 11/19/15  5:53 PM  Result Value Ref Range   Activated Clotting Time 214 seconds  Glucose, capillary     Status: Abnormal   Collection Time: 11/19/15  9:52 PM  Result Value Ref Range   Glucose-Capillary 117 (H) 65 - 99 mg/dL   Comment 1 Capillary Specimen   Basic metabolic panel     Status: Abnormal   Collection Time: 11/20/15  3:00 AM  Result Value Ref Range   Sodium 140 135 - 145 mmol/L   Potassium 4.4 3.5 - 5.1 mmol/L   Chloride 107 101 - 111 mmol/L   CO2 22 22 - 32 mmol/L   Glucose, Bld 94 65 - 99 mg/dL   BUN 23 (H) 6 - 20 mg/dL   Creatinine, Ser 1.33 (H) 0.61 - 1.24 mg/dL   Calcium 9.2 8.9 - 10.3 mg/dL   GFR calc non Af Amer 58 (L) >60 mL/min   GFR calc Af Amer >60 >60 mL/min    Comment: (NOTE) The eGFR has been calculated using the CKD EPI equation. This calculation has not been validated in all clinical situations. eGFR's persistently <60 mL/min signify possible Chronic Kidney Disease.    Anion gap 11 5 - 15  Glucose, capillary     Status: Abnormal   Collection Time: 11/20/15  7:26 AM  Result Value Ref Range   Glucose-Capillary 116 (H) 65 - 99 mg/dL  Glucose, capillary     Status: None   Collection Time: 11/20/15  9:23 AM  Result Value Ref Range   Glucose-Capillary 92 65 - 99 mg/dL   Comment 1 Capillary Specimen   Glucose, capillary     Status: Abnormal   Collection Time: 11/20/15 12:35 PM  Result Value Ref Range   Glucose-Capillary 157 (H) 65 - 99 mg/dL    Dg Orthopantogram  11/19/2015  CLINICAL DATA:  Dental caries EXAM: ORTHOPANTOGRAM/PANORAMIC COMPARISON:  None. FINDINGS: Several teeth are  missing. There is a cavity in the superior right first molar. No obvious abscess or erosion seen. No fracture or dislocation. No bony destruction. IMPRESSION: Sizable cavity superior right first molar. No erosive change or bony destruction. No fracture or dislocation.  Electronically Signed   By: Lowella Grip III M.D.   On: 11/19/2015 10:39    Review of Systems  Constitutional: Negative for fever, chills, weight loss and malaise/fatigue.  HENT:       Bad teeth in the back on both sides  Eyes: Negative.   Respiratory: Positive for shortness of breath. Negative for cough, hemoptysis and sputum production.   Cardiovascular: Positive for chest pain, orthopnea and leg swelling. Negative for palpitations and PND.  Gastrointestinal: Positive for nausea, vomiting and abdominal pain. Negative for blood in stool and melena.  Genitourinary: Positive for dysuria and frequency.  Musculoskeletal: Negative.   Skin: Negative.   Neurological: Negative.   Endo/Heme/Allergies: Negative.   Psychiatric/Behavioral: The patient is nervous/anxious.    Blood pressure 132/68, pulse 65, temperature 97.6 F (36.4 C), temperature source Oral, resp. rate 20, height _0  (1.626 m), weight 99.655 kg (219 lb 11.2 oz), SpO2 100 %. Physical Exam  Constitutional: He is oriented to person, place, and time. He appears well-developed and well-nourished. No distress.  HENT:  Head: Normocephalic and atraumatic.  Eyes: EOM are normal. Pupils are equal, round, and reactive to light.  Neck: Normal range of motion. Neck supple. No thyromegaly present.  Transmitted murmur to both sides of neck  Cardiovascular: Normal rate, regular rhythm and intact distal pulses.   Murmur heard. 3/6 systolic murmur along LSB, crisp valve closure, 1/6 diastolic murmur at apex  Respiratory: Effort normal and breath sounds normal. No respiratory distress.  GI: Soft. Bowel sounds are normal. He exhibits no mass. There is no tenderness.  obese   Musculoskeletal: Normal range of motion. He exhibits edema.  In both lower legs  Lymphadenopathy:    He has no cervical adenopathy.  Neurological: He is alert and oriented to person, place, and time. He has normal strength. No cranial nerve deficit or sensory deficit.  Skin: Skin is warm and dry.  Psychiatric: He has a normal mood and affect.    *[facility]*  [address] [place], [place] [address]  [phone]  ------------------------------------------------------------------- Transthoracic Echocardiography  Patient: Jarry, Manon MR #: [id number] Study Date: 11/17/2015 Gender: M Age: 12 Height: 162.6 cm Weight: 102 kg BSA: 2.2 m^2 Pt. Status: Room: [place], Roderic Scarce N SONOGRAPHER Leavy Cella PERFORMING [facility], Inpatient ATTENDING Mackuen, Courteney Lyn ORDERING Mackuen, Courteney Lyn  cc:  ------------------------------------------------------------------- LV EF: 55% - 60%  ------------------------------------------------------------------- Indications: Dyspnea 786.09.  ------------------------------------------------------------------- History: PMH: Bicuspid AoV. Murmur. Risk factors: Hypertension. Diabetes mellitus. Dyslipidemia.  ------------------------------------------------------------------- Study Conclusions  - Left ventricle: The cavity size was normal. There was mild  concentric hypertrophy. Systolic function was normal. The  estimated ejection fraction was in the range of 55% to 60%. Wall  motion was normal; there were no regional wall motion  abnormalities. Doppler parameters are consistent with high  ventricular filling pressure. - Aortic valve: Bicuspid; mildly thickened, mildly calcified  leaflets.  Transvalvular velocity was minimally increased. There  was no stenosis. There was severe regurgitation directed towards  the mitral anterior leaflet. - Mitral valve: There was mild regurgitation. Regurgitation is  present during late diastole secondary to severe aortic  regurgitation. - Left atrium: The atrium was moderately dilated.  Impressions:  - When compared to prior, aortic regurgitation has progressed.  Transthoracic echocardiography. M-mode, complete 2D, spectral Doppler, and color Doppler. Birthdate: Patient birthdate: 01/02/1961. Age: Patient is 55 yr old. Sex: Gender: male. BMI: 38.6 kg/m^2. Blood pressure: 128/65 Patient status: Inpatient.  Study date: Study date: 11/17/2015. Study time: 10:06 AM. Location: Bedside.  -------------------------------------------------------------------  ------------------------------------------------------------------- Left ventricle: The cavity size was normal. There was mild concentric hypertrophy. Systolic function was normal. The estimated ejection fraction was in the range of 55% to 60%. Wall motion was normal; there were no regional wall motion abnormalities. Doppler parameters are consistent with high ventricular filling pressure.  ------------------------------------------------------------------- Aortic valve: Bicuspid; mildly thickened, mildly calcified leaflets. Mobility was not restricted. Doppler: Transvalvular velocity was minimally increased. There was no stenosis. There was severe regurgitation directed towards the mitral anterior leaflet.  VTI ratio of LVOT to aortic valve: 0.41. Valve area (VTI): 1.16 cm^2. Indexed valve area (VTI): 0.53 cm^2/m^2. Peak velocity ratio of LVOT to aortic valve: 0.41. Valve area (Vmax): 1.15 cm^2. Indexed valve area (Vmax): 0.52 cm^2/m^2. Mean velocity ratio of LVOT to aortic valve: 0.38. Valve area (Vmean): 1.07 cm^2. Indexed valve area (Vmean): 0.49  cm^2/m^2. Mean gradient (S): 12 mm Hg. Peak gradient (S): 22 mm Hg.  ------------------------------------------------------------------- Aorta: Aortic root: The aortic root was normal in size.  ------------------------------------------------------------------- Mitral valve: Structurally normal valve. Mobility was not restricted. Doppler: Transvalvular velocity was within the normal range. There was no evidence for stenosis. There was mild regurgitation. Regurgitation is present during late diastole secondary to severe aortic regurgitation. Peak gradient (D): 4 mm Hg.  ------------------------------------------------------------------- Left atrium: The atrium was moderately dilated.  ------------------------------------------------------------------- Right ventricle: The cavity size was normal. Wall thickness was normal. Systolic function was normal.  ------------------------------------------------------------------- Pulmonic valve: Poorly visualized. Structurally normal valve. Cusp separation was normal. Doppler: Transvalvular velocity was within the normal range. There was no evidence for stenosis. There was no regurgitation.  ------------------------------------------------------------------- Tricuspid valve: Structurally normal valve. Doppler: Transvalvular velocity was within the normal range. There was mild regurgitation.  ------------------------------------------------------------------- Pulmonary artery: The main pulmonary artery was normal-sized. Systolic pressure was within the normal range.  ------------------------------------------------------------------- Right atrium: The atrium was normal in size.  ------------------------------------------------------------------- Pericardium: There was no pericardial effusion.  ------------------------------------------------------------------- Systemic veins: Inferior vena cava:  The vessel was dilated. The respirophasic diameter changes were blunted (< 50%), consistent with elevated central venous pressure.  ------------------------------------------------------------------- Measurements  Left ventricle Value Reference LV ID, ED, PLAX chordal 48 mm 43 - 52 LV ID, ES, PLAX chordal 35 mm 23 - 38 LV fx shortening, PLAX chordal (L) 27 % >=29 LV PW thickness, ED 13 mm --------- IVS/LV PW ratio, ED 1 <=1.3 Stroke volume, 2D 56 ml --------- Stroke volume/bsa, 2D 25 ml/m^2 --------- LV e&', lateral 7.02 cm/s --------- LV E/e&', lateral 14.39 --------- LV e&', medial 5.85 cm/s --------- LV E/e&', medial 17.26 --------- LV e&', average 6.44 cm/s --------- LV E/e&', average 15.7 ---------  Ventricular septum Value Reference IVS thickness, ED 13 mm ---------  LVOT Value Reference LVOT ID, S 19 mm --------- LVOT area 2.84 cm^2 --------- LVOT peak velocity, S 94 cm/s --------- LVOT mean velocity, S 62 cm/s --------- LVOT VTI, S 19.6 cm ---------  Aortic valve Value Reference Aortic valve peak velocity, S 232 cm/s --------- Aortic valve mean velocity, S 164 cm/s  --------- Aortic valve VTI, S 48 cm --------- Aortic mean gradient, S 12 mm Hg --------- Aortic peak gradient, S 22 mm Hg --------- VTI ratio, LVOT/AV 0.41 --------- Aortic valve area, VTI 1.16 cm^2 --------- Aortic valve area/bsa, VTI 0.53 cm^2/m^2 --------- Velocity ratio, peak, LVOT/AV 0.41 --------- Aortic valve area, peak velocity 1.15 cm^2 --------- Aortic valve area/bsa, peak 0.52 cm^2/m^2 --------- velocity Velocity ratio, mean, LVOT/AV 0.38 --------- Aortic valve  area, mean velocity 1.07 cm^2 --------- Aortic valve area/bsa, mean 0.49 cm^2/m^2 --------- velocity Aortic regurg pressure half-time 456 ms ---------  Aorta Value Reference Aortic root ID, ED 30 mm ---------  Left atrium Value Reference LA ID, A-P, ES 42 mm --------- LA ID/bsa, A-P 1.91 cm/m^2 <=2.2 LA volume, S 96 ml --------- LA volume/bsa, S 43.7 ml/m^2 --------- LA volume, ES, 1-p A4C 115 ml --------- LA volume/bsa, ES, 1-p A4C 52.4 ml/m^2 --------- LA volume, ES, 1-p A2C 65 ml --------- LA volume/bsa, ES, 1-p A2C 29.6 ml/m^2 ---------  Mitral valve Value Reference Mitral E-wave peak velocity 101 cm/s --------- Mitral deceleration time (L) 148 ms 150 - 230 Mitral peak gradient, D  4 mm Hg ---------  Systemic veins Value Reference Estimated CVP 3 mm Hg ---------  Legend: (L) and (H) mark values outside specified reference range.  ------------------------------------------------------------------- Prepared and Electronically Authenticated by  Candee Furbish, M.D. 2017-03-13T11:26:28  Belva Crome, MD (Primary)      Procedures    Right/Left Heart Cath and Coronary Angiography    Conclusion    1. Mid RCA lesion, 85% stenosed. 2. RPDA lesion, 75% stenosed. 3. Prox Cx to Dist Cx lesion, 65% stenosed. 4. Ost 2nd Mrg to 2nd Mrg lesion, 60% stenosed. 5. LM lesion, 50% stenosed. 6. 1st Diag lesion, 80% stenosed. 7. Mid LAD lesion, 90% stenosed. 8. Dist LAD lesion, 50% stenosed.   Severe coronary disease with high-grade segmental stenosis in the mid LAD, high-grade obstruction in the first diagonal, significant stenosis in the mid RCA, and eccentric segmental 50% left main.  The circumflex coronary artery contains a proximal and mid 50-60% stenoses.  Left ventriculography was not performed due to markedly elevated end-diastolic pressures, 46 mmHg.  Severe pulmonary hypertension, measured at 103/53 mmHg  Marked elevation and pulmonary capillary wedge mean pressure 33 mmHg  Known severe aortic regurgitation with hemodynamics compatible with acute on chronic process.  RECOMMENDATIONS:   The patient will be moved to the intensive care unit or a transitional care unit bed  Aggressive diuresis to lower pulmonary pressures and end-diastolic pressure.  Consider advanced heart failure service consultation.  Consider decreasing the dose of beta blocker therapy as this will shorten diastolic left ventricular filling time and may help to lower LVEDP and LV end-diastolic dimension.  Hemodynamically, the patient is severely compromised. CO-OX 53%.  Monitor kidney  function. 90 cc of contrast was used for the procedure.  Will need aortic valve replacement and multivessel coronary bypass grafting.        Indications    AI (aortic insufficiency) [I35.1 (ICD-10-CM)]   Acute on chronic systolic congestive heart failure (HCC) [I50.23 (ICD-10-CM)]   Coronary artery disease involving native coronary artery of native heart without angina pectoris [I25.10 (ICD-10-CM)]    Technique and Indications    Right heart catheterization was performed via the right antecubital vein. Local xylocaine infiltration was given. Sedation was then administered. A previously placed angiocath in the right antecubital vein was exchanged over a .014 short guide wire. Double glove technique was used. A 5 French brachial sheath was inserted after 1% Xylocaine local infiltration. The external layer of gloves were then removed. A 5 French balloon tipped Gordy Councilman was then advanced for right heart pressure recording and oximetry sampling. Intermittent fluoroscopic guidance was used. No complications occurred.  The right radial area was sterilely prepped and draped. Intravenous sedation with Versed and fentanyl was administered. 1% Xylocaine was infiltrated to achieve local analgesia. A double wall stick with an  angiocath was utilized to obtain intra-arterial access. The modified Seldinger technique was used to place a 38F " Slender" sheath in the right radial artery. Weight based heparin was administered. Coronary angiography was done using 5 F catheters. In the AP views, the catheters ended beyond the midline due to marked tortuosity. Catheter torque was difficult. Right coronary angiography was performed with a E-Z Rad Right coronary catheter. Left ventricular hemodymic recordings were performed with this catheter. Left coronary angiography was performed with an EBU 3.5 centimeter left coronary catheter. Aortography and left ventriculography was not performed after noting marked elevation  in pulmonary capillary wedge, pulmonary artery, and left ventricular end-diastolic pressures.  Hemostasis was achieved using a pneumatic band at the arterial puncture site and manual compression of the venous puncture site.  During this procedure the patient is administered a total of Versed 1 mg and Fentanyl 50 g to achieve and maintain moderate conscious sedation. The patient's heart rate, blood pressure, and oxygen saturation are monitored continuously during the procedure. The period of conscious sedation is 44 minutes, of which I was present face-to-face 100% of this time.Estimated blood loss <50 mL. There were no immediate complications during the procedure.    Coronary Findings    Dominance: Co-dominant   Left Main   . LM lesion, 50% stenosed. Eccentric.     Left Anterior Descending   . Mid LAD lesion, 90% stenosed. The lesion is type C Diffuse eccentric.   Marland Kitchen Dist LAD lesion, 50% stenosed. The lesion is type C.   . First Diagonal Branch   . 1st Diag lesion, 80% stenosed.   . First Septal Branch   The vessel is small in size.     Ramus Intermedius  . Vessel is small.     Left Circumflex  . Vessel is small.   . Prox Cx to Dist Cx lesion, 65% stenosed. Eccentric.   Marland Kitchen Second Obtuse Marginal Branch   The vessel is large in size.   Colon Flattery 2nd Mrg to 2nd Mrg lesion, 60% stenosed. Eccentric.   Marland Kitchen Third Obtuse Marginal Branch   The vessel is small in size.     Right Coronary Artery   . Mid RCA lesion, 85% stenosed. The lesion is type C Eccentric.   . Right Posterior Descending Artery   . RPDA lesion, 75% stenosed. The lesion is type C.       Right Heart Pressures Hemodynamic findings consistent with severe pulmonary hypertension. Elevated LV EDP consistent with volume overload.    Left Heart    Left Ventricle Not visualized. LVEF known to be    Coronary Diagrams    Diagnostic Diagram            Implants     No implant documentation for this case.      PACS Images    Show images for Cardiac catheterization     Link to Procedure Log    Procedure Log      Hemo Data       Most Recent Value   Fick Cardiac Output  3.15 L/min   Fick Cardiac Output Index  1.53 (L/min)/BSA   RA A Wave  28 mmHg   RA V Wave  25 mmHg   RA Mean  25 mmHg   RV Systolic Pressure  96 mmHg   RV Diastolic Pressure  19 mmHg   RV EDP  31 mmHg   PA Systolic Pressure  309 mmHg   PA Diastolic Pressure  53 mmHg  PA Mean  71 mmHg   PW A Wave  37 mmHg   PW V Wave  41 mmHg   PW Mean  33 mmHg   AO Systolic Pressure  211 mmHg   AO Diastolic Pressure  80 mmHg   AO Mean  173 mmHg   LV Systolic Pressure  567 mmHg   LV Diastolic Pressure  21 mmHg   LV EDP  46 mmHg   Arterial Occlusion Pressure Extended Systolic Pressure  014 mmHg   Arterial Occlusion Pressure Extended Diastolic Pressure  83 mmHg   Arterial Occlusion Pressure Extended Mean Pressure  116 mmHg   Left Ventricular Apex Extended Systolic Pressure  103 mmHg   Left Ventricular Apex Extended Diastolic Pressure  19 mmHg   Left Ventricular Apex Extended EDP Pressure  44 mmHg   QP/QS  1   TPVR Index  39.21 HRUI   TSVR Index  71.22 HRUI   PVR SVR Ratio  0.33   TPVR/TSVR Ratio  0.55      Procedure: TEE  Indication: Aortic insufficiency.   Sedation: Versed 7 mg IV, 75 mcg fentanyl, 75 mg Benadryl  Findings: Please see echo section for full report. Mildly dilated LV with EF 60%. Mild LV hypertrophy. Normal wall motion. Normal RV size and systolic function. Trivial TR with peak RV-RA gradient 35 mmHg. Trivial mitral regurgitation. Bicuspid aortic valve with mean gradient 10 mmHg across the valve (likely due to high flow, did not appear significantly stenosed) and severe regurgitation. There was holodiastolic flow reversal in the ascending thoracic aorta. Mild left atrial enlargement, normal right atrium. There was smoke but no definite thrombus in the left atrial  appendage. There was a small PFO noted by color doppler with crossing of a couple of bubbles (difficult bubble study). Aortic root dilated to 3.8 cm, normal caliber ascending aorta.   Impression: Severe aortic insufficiency with bicuspid valve.   Loralie Champagne 11/20/2015 8:47 AM  Assessment/Plan:  He has a bicuspid aortic valve with severe AI and severe multi-vessel coronary disease presenting with acute on chronic diastolic heart failure after starting a course of prednisone and Indocin for a gout flare. He had moderate to severe AI last July and it has progressed. He denies any symptoms until taking the above meds but he is sedentary and unemployed. His LV function is still normal and the LV dimensions have been fairly stable and still within normal limits. He does have severe pulmonary hypertension and a markedly elevated LVEDP at cath. He looks clinically good right now which is surprising given these numbers with severe AI. His aortic root is minimally dilated. He will require AVR and CABG, possible closure of a small PFO. He has been seen by Dr. Enrique Sack and is going to have some dental extractions tomorrow. I will plan to do surgery Tuesday next week which will allow time for him to get over the extractions and diurese some. I discussed the operative procedure with the patient including alternatives, benefits and risks; including but not limited to bleeding, blood transfusion, infection, stroke, myocardial infarction, graft failure, heart block requiring a permanent pacemaker, organ dysfunction, and death.  [name] understands and agrees to proceed.     Gaye Pollack 11/20/2015, 4:57 PM

## 2015-11-20 NOTE — Progress Notes (Addendum)
   Subjective: Underwent cardiac cath yesterday showing critical stenosis of multiple vessels and pulmonary artery hypertension. Orthopantogram with 1st superior right molar caries. Went for TEE this morning redemonstrating severe aortic regurgitation. Net negative a liter with overnight diuresis. No new symptomatic complaints.  Objective: Vital signs in last 24 hours: Filed Vitals:   11/20/15 0853 11/20/15 0900 11/20/15 0910 11/20/15 0924  BP: 123/59 103/53 113/52   Pulse: 63 64 62   Temp:    97.8 F (36.6 C)  TempSrc:    Oral  Resp: 25 30 23    Height:      Weight:      SpO2: 95% 97% 99%    Weight change: 0.513 kg (1 lb 2.1 oz)  Intake/Output Summary (Last 24 hours) at 11/20/15 1138 Last data filed at 11/20/15 1000  Gross per 24 hour  Intake    360 ml  Output   1525 ml  Net  -1165 ml   GENERAL- alert, co-operative, NAD HEENT- Atraumatic, oral mucosa appears moist CARDIAC- RRR, 3/6 early diastolic murmur loudest over RUSB RESP- Good air movement, faint basilar crackles b/l. EXTREMITIES- symmetric, trace pedal edema b/l, missing distal phalanges of 3rd and 4th digits on right hand SKIN- Warm, dry, No rash or lesion. PSYCH- Normal mood and affect, appropriate thought content and speech.  Medications: I have reviewed the patient's current medications. Scheduled Meds: . allopurinol  300 mg Oral Daily  . atenolol  100 mg Oral Daily  . azelastine  1 spray Each Nare QODAY  . furosemide  80 mg Intravenous Q12H  . heparin  5,000 Units Subcutaneous 3 times per day  . insulin aspart  0-9 Units Subcutaneous TID WC  . multivitamin with minerals  1 tablet Oral Daily  . pantoprazole  40 mg Oral Daily  . pravastatin  20 mg Oral QHS  . sodium chloride flush  3 mL Intravenous Q12H   Continuous Infusions:  PRN Meds:.sodium chloride, acetaminophen, ondansetron (ZOFRAN) IV, ondansetron, oxyCODONE-acetaminophen, sodium chloride flush Assessment/Plan: Critical coronary artery disease:  Critical stenosis of mid LAD 90%, 1st Diag 80%, mid RCA 85%. Severe PA HTN 103/53, co-ox 53%. Patient should undergo CABG to minimize further progression of heart failure and his very high risk of MI. In the meantime we will increase diuresis for severely elevated pulmonary artery pressure. Down a liter overnight diuresis. At this time anticipate holding antiplatelet therapy for probably upcoming surgery but will clarify with TCTS service. -TCTS consulted -Daily Bmet -Increase lasix to 80mg  IV BID -continue cardiac monitoring  Severe aortic insufficiency 2/2 bicuspid aortic valve: Will need open repair of AV at time of CABG.  Hypertension: slightly side pulse pressures consistent with severe AI. Decreasing pressures with diuresis will titrate down atenolol if appropriate.  AKI: SCr 1.33 today, not increased compared to pre-cath labs yesterday. Good UOP in response to IV lasix. T2DM: CBGs currently diet controlled in hospital  Dispo: Patient needs further inpatient management and likely surgery next week prior to safe discharge.  The patient does have a current PCP (Quentin Angst, MD) and does need an [facility] hospital follow-up appointment after discharge.  The patient does not have transportation limitations that hinder transportation to clinic appointments.  LOS: 7 days  Fuller Plan, MD 11/20/2015, 11:38 AM

## 2015-11-20 NOTE — Progress Notes (Signed)
Internal Medicine Attending:   I saw and examined the patient. I reviewed the resident's note and I agree with the resident's findings and plan as documented in the resident's note.  55 year old man admitted with what we now see was symptomatic volume overload in the setting of severe pulmonary hypertension due to acute on chronic aortic valve insufficiency. Patient is doing well this morning. His vitals are stable and he is saturating well on room air. He has no acute complaints, was eating and says he feels well with no chest pain or dyspnea at rest. He underwent cardiac catheterization yesterday which demonstrated multivessel coronary artery disease. Then he underwent transesophageal echocardiogram this morning and I have not yet seen the results.   Given his high risk hemodynamics seen on cardiac cath, the plan is to keep the patient in-house until we can arrange for aortic valve replacement with CT surgery. We are diuresing with IV furosemide. Given his multivessel ischemic heart disease likely he is also a candidate  for bypass grafting. He has dental extraction scheduled for tomorrow morning which is a requisite for the valve replacement. CT surgery to see the patient soon and coordinate the timing.  I anticipate it is ok to resume aspirin 81mg  now, but we will ask surgery to ensure it does not delay his replacement.

## 2015-11-20 NOTE — Progress Notes (Addendum)
Subjective:  No complaints overnight  Objective:  Vital Signs in the last 24 hours: Temp:  [97.4 F (36.3 C)-98.1 F (36.7 C)] 97.8 F (36.6 C) (03/16 0924) Pulse Rate:  [62-75] 62 (03/16 0910) Resp:  [0-37] 23 (03/16 0910) BP: (103-179)/(41-105) 113/52 mmHg (03/16 0910) SpO2:  [95 %-100 %] 99 % (03/16 0910) Weight:  [219 lb 11.2 oz (99.655 kg)-225 lb 8.5 oz (102.3 kg)] 219 lb 11.2 oz (99.655 kg) (03/16 0446)  Intake/Output from previous day:  Intake/Output Summary (Last 24 hours) at 11/20/15 1041 Last data filed at 11/20/15 1000  Gross per 24 hour  Intake    360 ml  Output   1525 ml  Net  -1165 ml    Physical Exam: General appearance: alert, cooperative and no distress Lungs: clear to auscultation bilaterally Heart: regular rate and rhythm and 2/6 systolic murmur, 3-3/8 diastolic murmur   Rate: 66  Rhythm: normal sinus rhythm  Lab Results:  Recent Labs  11/19/15 0245  WBC 8.2  HGB 14.2  PLT 293    Recent Labs  11/19/15 1118 11/20/15 0300  NA 142 140  K 4.3 4.4  CL 109 107  CO2 22 22  GLUCOSE 115* 94  BUN 21* 23*  CREATININE 1.32* 1.33*   No results for input(s): TROPONINI in the last 72 hours.  Invalid input(s): CK, MB  Recent Labs  11/19/15 0245  INR 1.14    Scheduled Meds: . allopurinol  300 mg Oral Daily  . atenolol  100 mg Oral Daily  . azelastine  1 spray Each Nare QODAY  . furosemide  80 mg Intravenous Q12H  . heparin  5,000 Units Subcutaneous 3 times per day  . insulin aspart  0-9 Units Subcutaneous TID WC  . multivitamin with minerals  1 tablet Oral Daily  . pantoprazole  40 mg Oral Daily  . pravastatin  20 mg Oral QHS  . sodium chloride flush  3 mL Intravenous Q12H   Continuous Infusions:   PRN Meds:.sodium chloride, acetaminophen, ondansetron (ZOFRAN) IV, ondansetron, oxyCODONE-acetaminophen, sodium chloride flush   Imaging: Imaging results have been reviewed  Cardiac Studies: Echo 11/17/15 Study Conclusions  -  Left ventricle: The cavity size was normal. There was mild  concentric hypertrophy. Systolic function was normal. The  estimated ejection fraction was in the range of 55% to 60%. Wall  motion was normal; there were no regional wall motion  abnormalities. Doppler parameters are consistent with high  ventricular filling pressure. - Aortic valve: Bicuspid; mildly thickened, mildly calcified  leaflets. Transvalvular velocity was minimally increased. There  was no stenosis. There was severe regurgitation directed towards  the mitral anterior leaflet. - Mitral valve: There was mild regurgitation. Regurgitation is  present during late diastole secondary to severe aortic  regurgitation. - Left atrium: The atrium was moderately dilated.  Impressions:  - When compared to prior, aortic regurgitation has progressed.  Assessment/Plan:  55 y/o AA male with a history of HTN, DM, HLD, and gout. He has known bicuspid AOV and AI. He recently had CHF and his AI was noted to be severe and we were asked to see him in consult.  Active Problems:   Essential hypertension   Dyslipidemia   Dyspnea   Abdominal pain, epigastric   Bicuspid aortic valve   AKI (acute kidney injury) (HCC)   Controlled type 2 diabetes mellitus with diabetic nephropathy, without long-term current use of insulin (HCC)   Melanotic stools   Acute gouty arthritis  Severe aortic regurgitation   Acute CHF    Poor dentition   PLAN: SCr is up a little-cath may need to be put off, will discuss with MD. He is for TEE tomorrow.    Corine Shelter PA-C 11/20/2015, 10:41 AM [phone]  Attending Note:   The patient was seen and examined.  Agree with assessment and plan as noted above.  Changes made to the above note as needed.   1. CAD  At cath, patient was found to have critical CAD Will need CABG at the time of AVR  2. Bicuspid AV with severe AI For AVR  I have called TCTS and Dr. Laneta Simmers will see him today    Possible surgery next Tuesday   Needs diuresing for his severe pulmonary HTN Is net negative 1.1 liters so far this admission    Alvia Grove., MD, [facility] 11/20/2015, 10:41 AM [address],  [address] Office [phone] Pager [phone]

## 2015-11-21 ENCOUNTER — Inpatient Hospital Stay (HOSPITAL_COMMUNITY): Payer: Commercial Managed Care - HMO | Admitting: Certified Registered"

## 2015-11-21 ENCOUNTER — Encounter (HOSPITAL_COMMUNITY): Payer: Self-pay | Admitting: Cardiology

## 2015-11-21 ENCOUNTER — Encounter (HOSPITAL_COMMUNITY)
Admission: EM | Disposition: A | Discharge status: Home / Self Care | Payer: Self-pay | Source: Home / Self Care | Attending: Surgery

## 2015-11-21 DIAGNOSIS — K0530 Chronic periodontitis, unspecified: Secondary | ICD-10-CM | POA: Diagnosis present

## 2015-11-21 DIAGNOSIS — K083 Retained dental root: Secondary | ICD-10-CM | POA: Diagnosis present

## 2015-11-21 DIAGNOSIS — K045 Chronic apical periodontitis: Secondary | ICD-10-CM | POA: Diagnosis present

## 2015-11-21 HISTORY — PX: MULTIPLE EXTRACTIONS WITH ALVEOLOPLASTY: SHX5342

## 2015-11-21 LAB — GLUCOSE, CAPILLARY
GLUCOSE-CAPILLARY: 124 mg/dL — AB (ref 65–99)
GLUCOSE-CAPILLARY: 145 mg/dL — AB (ref 65–99)
GLUCOSE-CAPILLARY: 88 mg/dL (ref 65–99)
Glucose-Capillary: 104 mg/dL — ABNORMAL HIGH (ref 65–99)
Glucose-Capillary: 83 mg/dL (ref 65–99)

## 2015-11-21 LAB — BASIC METABOLIC PANEL
ANION GAP: 11 (ref 5–15)
BUN: 22 mg/dL — ABNORMAL HIGH (ref 6–20)
CO2: 27 mmol/L (ref 22–32)
Calcium: 9.3 mg/dL (ref 8.9–10.3)
Chloride: 102 mmol/L (ref 101–111)
Creatinine, Ser: 1.52 mg/dL — ABNORMAL HIGH (ref 0.61–1.24)
GFR, EST AFRICAN AMERICAN: 57 mL/min — AB (ref >60)
GFR, EST NON AFRICAN AMERICAN: 50 mL/min — AB (ref >60)
Glucose, Bld: 118 mg/dL — ABNORMAL HIGH (ref 65–99)
POTASSIUM: 4 mmol/L (ref 3.5–5.1)
SODIUM: 140 mmol/L (ref 135–145)

## 2015-11-21 LAB — SURGICAL PCR SCREEN
MRSA, PCR: NEGATIVE
STAPHYLOCOCCUS AUREUS: POSITIVE — AB

## 2015-11-21 SURGERY — MULTIPLE EXTRACTION WITH ALVEOLOPLASTY
Anesthesia: General | Laterality: Bilateral

## 2015-11-21 MED ORDER — EPHEDRINE SULFATE 50 MG/ML IJ SOLN
INTRAMUSCULAR | Status: DC | PRN
  Administered 2015-11-21 (×5): 10 mg via INTRAVENOUS

## 2015-11-21 MED ORDER — LIDOCAINE-EPINEPHRINE 2 %-1:100000 IJ SOLN
INTRAMUSCULAR | Status: AC
  Filled 2015-11-21: qty 1.7

## 2015-11-21 MED ORDER — LACTATED RINGERS IV SOLN
INTRAVENOUS | Status: DC | PRN
  Administered 2015-11-21: 07:00:00 via INTRAVENOUS

## 2015-11-21 MED ORDER — ROCURONIUM BROMIDE 100 MG/10ML IV SOLN
INTRAVENOUS | Status: DC | PRN
  Administered 2015-11-21: 40 mg via INTRAVENOUS

## 2015-11-21 MED ORDER — FENTANYL CITRATE (PF) 250 MCG/5ML IJ SOLN
INTRAMUSCULAR | Status: AC
  Filled 2015-11-21: qty 5

## 2015-11-21 MED ORDER — CEFAZOLIN SODIUM-DEXTROSE 2-3 GM-% IV SOLR
INTRAVENOUS | Status: DC | PRN
  Administered 2015-11-21: 2 g via INTRAVENOUS

## 2015-11-21 MED ORDER — ONDANSETRON HCL 4 MG/2ML IJ SOLN
INTRAMUSCULAR | Status: AC
  Filled 2015-11-21: qty 2

## 2015-11-21 MED ORDER — EPHEDRINE SULFATE 50 MG/ML IJ SOLN
INTRAMUSCULAR | Status: AC
  Filled 2015-11-21: qty 1

## 2015-11-21 MED ORDER — PHENYLEPHRINE HCL 10 MG/ML IJ SOLN
INTRAMUSCULAR | Status: DC | PRN
  Administered 2015-11-21: 120 ug via INTRAVENOUS
  Administered 2015-11-21 (×2): 80 ug via INTRAVENOUS

## 2015-11-21 MED ORDER — OXYCODONE-ACETAMINOPHEN 5-325 MG PO TABS
ORAL_TABLET | ORAL | Status: AC
  Filled 2015-11-21: qty 2

## 2015-11-21 MED ORDER — FENTANYL CITRATE (PF) 100 MCG/2ML IJ SOLN
INTRAMUSCULAR | Status: AC
  Administered 2015-11-21: 25 ug via INTRAVENOUS
  Filled 2015-11-21: qty 2

## 2015-11-21 MED ORDER — PROPOFOL 10 MG/ML IV BOLUS
INTRAVENOUS | Status: DC | PRN
  Administered 2015-11-21: 200 mg via INTRAVENOUS

## 2015-11-21 MED ORDER — SODIUM CHLORIDE 0.9 % IJ SOLN
INTRAMUSCULAR | Status: AC
  Filled 2015-11-21: qty 10

## 2015-11-21 MED ORDER — STERILE WATER FOR IRRIGATION IR SOLN
Status: DC | PRN
  Administered 2015-11-21: 1000 mL

## 2015-11-21 MED ORDER — LIDOCAINE-EPINEPHRINE 2 %-1:100000 IJ SOLN
INTRAMUSCULAR | Status: DC | PRN
  Administered 2015-11-21 (×2): 1.7 mL via INTRADERMAL

## 2015-11-21 MED ORDER — OXYCODONE HCL 5 MG PO TABS: 5.0000 mg | ORAL_TABLET | Freq: Once | ORAL | Status: DC | PRN

## 2015-11-21 MED ORDER — ONDANSETRON HCL 4 MG/2ML IJ SOLN
4.0000 mg | Freq: Four times a day (QID) | INTRAMUSCULAR | Status: AC | PRN
  Administered 2015-11-21: 4 mg via INTRAVENOUS

## 2015-11-21 MED ORDER — SUGAMMADEX SODIUM 200 MG/2ML IV SOLN
INTRAVENOUS | Status: DC | PRN
  Administered 2015-11-21: 200 mg via INTRAVENOUS

## 2015-11-21 MED ORDER — ROCURONIUM BROMIDE 50 MG/5ML IV SOLN
INTRAVENOUS | Status: AC
  Filled 2015-11-21: qty 1

## 2015-11-21 MED ORDER — MIDAZOLAM HCL 5 MG/5ML IJ SOLN
INTRAMUSCULAR | Status: DC | PRN
  Administered 2015-11-21: 2 mg via INTRAVENOUS

## 2015-11-21 MED ORDER — PHENYLEPHRINE 40 MCG/ML (10ML) SYRINGE FOR IV PUSH (FOR BLOOD PRESSURE SUPPORT)
PREFILLED_SYRINGE | INTRAVENOUS | Status: AC
  Filled 2015-11-21: qty 10

## 2015-11-21 MED ORDER — OXYCODONE HCL 5 MG/5ML PO SOLN: 5.0000 mg | Freq: Once | ORAL | Status: DC | PRN

## 2015-11-21 MED ORDER — LIDOCAINE HCL (CARDIAC) 20 MG/ML IV SOLN
INTRAVENOUS | Status: DC | PRN
  Administered 2015-11-21: 60 mg via INTRAVENOUS

## 2015-11-21 MED ORDER — LIDOCAINE HCL (CARDIAC) 20 MG/ML IV SOLN
INTRAVENOUS | Status: AC
  Filled 2015-11-21: qty 5

## 2015-11-21 MED ORDER — SUGAMMADEX SODIUM 200 MG/2ML IV SOLN
INTRAVENOUS | Status: AC
  Filled 2015-11-21: qty 2

## 2015-11-21 MED ORDER — PROPOFOL 10 MG/ML IV BOLUS
INTRAVENOUS | Status: AC
  Filled 2015-11-21: qty 20

## 2015-11-21 MED ORDER — MIDAZOLAM HCL 2 MG/2ML IJ SOLN
INTRAMUSCULAR | Status: AC
  Filled 2015-11-21: qty 2

## 2015-11-21 MED ORDER — HEMOSTATIC AGENTS (NO CHARGE) OPTIME
TOPICAL | Status: DC | PRN
  Administered 2015-11-21: 1 via TOPICAL

## 2015-11-21 MED ORDER — FENTANYL CITRATE (PF) 100 MCG/2ML IJ SOLN
INTRAMUSCULAR | Status: DC | PRN
Start: 2015-11-21 — End: 2015-11-21
  Administered 2015-11-21: 50 ug via INTRAVENOUS
  Administered 2015-11-21: 100 ug via INTRAVENOUS

## 2015-11-21 MED ORDER — FUROSEMIDE 10 MG/ML IJ SOLN
40.0000 mg | Freq: Two times a day (BID) | INTRAMUSCULAR | Status: DC
  Administered 2015-11-21: 40 mg via INTRAVENOUS
  Filled 2015-11-21: qty 4

## 2015-11-21 MED ORDER — FENTANYL CITRATE (PF) 100 MCG/2ML IJ SOLN
25.0000 ug | INTRAMUSCULAR | Status: DC | PRN
  Administered 2015-11-21: 25 ug via INTRAVENOUS

## 2015-11-21 MED ORDER — LACTATED RINGERS IV SOLN: INTRAVENOUS | Status: DC

## 2015-11-21 MED ORDER — 0.9 % SODIUM CHLORIDE (POUR BTL) OPTIME
TOPICAL | Status: DC | PRN
Start: 2015-11-21 — End: 2015-11-21
  Administered 2015-11-21: 1000 mL

## 2015-11-21 SURGICAL SUPPLY — 37 items
ALCOHOL 70% 16 OZ (MISCELLANEOUS) ×3 IMPLANT
ATTRACTOMAT 16X20 MAGNETIC DRP (DRAPES) ×3 IMPLANT
BLADE SURG 15 STRL LF DISP TIS (BLADE) ×2 IMPLANT
BLADE SURG 15 STRL SS (BLADE) ×6
COVER SURGICAL LIGHT HANDLE (MISCELLANEOUS) ×3 IMPLANT
GAUZE PACKING FOLDED 2  STR (GAUZE/BANDAGES/DRESSINGS) ×2
GAUZE PACKING FOLDED 2 STR (GAUZE/BANDAGES/DRESSINGS) ×1 IMPLANT
GAUZE SPONGE 4X4 16PLY XRAY LF (GAUZE/BANDAGES/DRESSINGS) ×3 IMPLANT
GLOVE BIOGEL PI IND STRL 6 (GLOVE) ×1 IMPLANT
GLOVE BIOGEL PI INDICATOR 6 (GLOVE) ×2
GLOVE SURG ORTHO 8.0 STRL STRW (GLOVE) ×3 IMPLANT
GLOVE SURG SS PI 6.0 STRL IVOR (GLOVE) ×3 IMPLANT
GOWN STRL REUS W/ TWL LRG LVL3 (GOWN DISPOSABLE) ×1 IMPLANT
GOWN STRL REUS W/TWL 2XL LVL3 (GOWN DISPOSABLE) ×3 IMPLANT
GOWN STRL REUS W/TWL LRG LVL3 (GOWN DISPOSABLE) ×3
HEMOSTAT SURGICEL 2X14 (HEMOSTASIS) ×1 IMPLANT
KIT BASIN OR (CUSTOM PROCEDURE TRAY) ×3 IMPLANT
KIT ROOM TURNOVER OR (KITS) ×3 IMPLANT
MANIFOLD NEPTUNE WASTE (CANNULA) ×3 IMPLANT
NDL BLUNT 16X1.5 OR ONLY (NEEDLE) ×1 IMPLANT
NEEDLE BLUNT 16X1.5 OR ONLY (NEEDLE) ×3 IMPLANT
NS IRRIG 1000ML POUR BTL (IV SOLUTION) ×3 IMPLANT
PACK EENT II TURBAN DRAPE (CUSTOM PROCEDURE TRAY) ×3 IMPLANT
PAD ARMBOARD 7.5X6 YLW CONV (MISCELLANEOUS) ×3 IMPLANT
SPONGE GAUZE 4X4 12PLY STER LF (GAUZE/BANDAGES/DRESSINGS) ×2 IMPLANT
SPONGE SURGIFOAM ABS GEL 100 (HEMOSTASIS) IMPLANT
SPONGE SURGIFOAM ABS GEL 12-7 (HEMOSTASIS) IMPLANT
SPONGE SURGIFOAM ABS GEL SZ50 (HEMOSTASIS) ×2 IMPLANT
SUCTION FRAZIER HANDLE 10FR (MISCELLANEOUS) ×2
SUCTION TUBE FRAZIER 10FR DISP (MISCELLANEOUS) ×1 IMPLANT
SUT CHROMIC 3 0 PS 2 (SUTURE) ×6 IMPLANT
SUT CHROMIC 4 0 P 3 18 (SUTURE) IMPLANT
SYR 50ML SLIP (SYRINGE) ×3 IMPLANT
TOWEL OR 17X26 10 PK STRL BLUE (TOWEL DISPOSABLE) ×3 IMPLANT
TUBE CONNECTING 12'X1/4 (SUCTIONS) ×1
TUBE CONNECTING 12X1/4 (SUCTIONS) ×2 IMPLANT
YANKAUER SUCT BULB TIP NO VENT (SUCTIONS) ×3 IMPLANT

## 2015-11-21 NOTE — Anesthesia Procedure Notes (Signed)
Procedure Name: Intubation Date/Time: 11/21/2015 7:37 AM Performed by: Rosiland Oz Pre-anesthesia Checklist: Patient identified, Timeout performed, Emergency Drugs available, Suction available and Patient being monitored Patient Re-evaluated:Patient Re-evaluated prior to inductionOxygen Delivery Method: Circle system utilized Preoxygenation: Pre-oxygenation with 100% oxygen Intubation Type: IV induction Laryngoscope Size: Miller and 2 Grade View: Grade II Tube type: Oral Tube size: 8.0 mm Number of attempts: 1 Airway Equipment and Method: Stylet Placement Confirmation: ETT inserted through vocal cords under direct vision,  breath sounds checked- equal and bilateral,  positive ETCO2 and CO2 detector Secured at: 23 cm Tube secured with: Tape Dental Injury: Teeth and Oropharynx as per pre-operative assessment

## 2015-11-21 NOTE — Anesthesia Preprocedure Evaluation (Signed)
Anesthesia Evaluation  Patient identified by MRN, date of birth, ID band Patient awake    Reviewed: Allergy & Precautions, NPO status , Patient's Chart, lab work & pertinent test results  Airway Mallampati: II   Neck ROM: full    Dental   Pulmonary shortness of breath,    breath sounds clear to auscultation       Cardiovascular hypertension, + Peripheral Vascular Disease and + DOE  + Valvular Problems/Murmurs AI  Rhythm:regular Rate:Normal     Neuro/Psych    GI/Hepatic   Endo/Other  diabetes, Type 2obese  Renal/GU Renal InsufficiencyRenal disease     Musculoskeletal   Abdominal   Peds  Hematology   Anesthesia Other Findings   Reproductive/Obstetrics                             Anesthesia Physical Anesthesia Plan  ASA: III  Anesthesia Plan: General   Post-op Pain Management:    Induction: Intravenous  Airway Management Planned: Nasal ETT  Additional Equipment:   Intra-op Plan:   Post-operative Plan: Extubation in OR  Informed Consent: I have reviewed the patients History and Physical, chart, labs and discussed the procedure including the risks, benefits and alternatives for the proposed anesthesia with the patient or authorized representative who has indicated his/her understanding and acceptance.     Plan Discussed with: CRNA, Anesthesiologist and Surgeon  Anesthesia Plan Comments:         Anesthesia Quick Evaluation

## 2015-11-21 NOTE — Progress Notes (Signed)
Internal Medicine Attending:   I saw and examined the patient. I reviewed the resident's note and I agree with the resident's findings and plan as documented in the resident's note.  55 year old man admitted with volume overload due to acute on chronic aortic insufficiency now scheduled to undergo simultaneous aortic valve replacement and CABG on Tuesday. In preparation he had several dental caries extracted this morning in the operating room. We saw him after the procedure and he is doing well, sitting up, having some bleeding. No chest pain or shortness of breath today. Pain is well controlled currently.  Labs are notable for a small increase in creatinine from 1.3-1.5. He is at risk for developing contrast induced nephropathy from cath procedure on 3/15, so we will watch renal function carefully. I agree with decreasing diuresis some for now with close follow up.

## 2015-11-21 NOTE — Progress Notes (Signed)
PRE-OPERATIVE NOTE:  11/21/2015 [name] [id number]  VITALS: BP 139/83 mmHg  Pulse 65  Temp(Src) 97.9 F (36.6 C) (Oral)  Resp 29  Ht 5\' 4"  (1.626 m)  Wt 219 lb 11.2 oz (99.655 kg)  BMI 37.69 kg/m2  SpO2 95%  Lab Results  Component Value Date   WBC 8.2 11/19/2015   HGB 14.2 11/19/2015   HCT 42.4 11/19/2015   MCV 89.8 11/19/2015   PLT 293 11/19/2015   BMET    Component Value Date/Time   NA 140 11/21/2015 0440   K 4.0 11/21/2015 0440   CL 102 11/21/2015 0440   CO2 27 11/21/2015 0440   GLUCOSE 118* 11/21/2015 0440   BUN 22* 11/21/2015 0440   CREATININE 1.52* 11/21/2015 0440   CREATININE 1.05 02/24/2015 1601   CALCIUM 9.3 11/21/2015 0440   GFRNONAA 50* 11/21/2015 0440   GFRNONAA 80 02/24/2015 1601   GFRAA 57* 11/21/2015 0440   GFRAA >89 02/24/2015 1601    Lab Results  Component Value Date   INR 1.14 11/19/2015   INR 1.02 05/27/2010   No results found for: PTT   [name] presents for extraction of indicated teeth with alveoloplasty and gross debridement of remaining teeth in the operating room with general anesthesia. Will plan on extraction of tooth #'s 2,12 and other teeth as indicated.   SUBJECTIVE: The patient denies any acute medical or dental changes and agrees to proceed with treatment as planned.  EXAM: No sign of acute dental changes.  ASSESSMENT: Patient is affected by chronic apical periodontitis, dental caries, retained root, chronic periodontitis, and accretions.  PLAN: Patient agrees to proceed with treatment as planned in the operating room as previously discussed and accepts the risks, benefits, and complications of the proposed treatment. Patient is aware of the risk for bleeding, bruising, swelling, infection, pain, nerve damage, soft tissue damage, damage to ajacent teeth, sinus involvement, root tip fracture, mandible fracture, and the risks of complications associated with the anesthesia. Patient also is aware of the potential  for complications up to and including death due to over all cardiovascular and respiratory compromise.Charlynne Pander, DDS

## 2015-11-21 NOTE — Op Note (Signed)
OPERATIVE REPORT  Patient:            [name] Date of Birth:  October 27, 1960 MRN:                [id number]   DATE OF PROCEDURE:  11/21/2015  PREOPERATIVE DIAGNOSES: 1. Severe aortic regurgitation 2. Severe coronary artery disease 3. Pre-heart valve surgery dental protocol 4. Chronic apical periodontitis 5. Retained root segments 6. Dental caries 7. Chronic periodontitis 8. Accretions  POSTOPERATIVE DIAGNOSES: 1. Severe aortic regurgitation 2. Severe coronary artery disease 3. Pre-heart valve surgery dental protocol 4. Chronic apical periodontitis 5. Retained root segments 6. Dental caries 7. Chronic periodontitis 8. Accretions  OPERATIONS: 1. Multiple extraction of tooth numbers 2 and 12 2. 2 Quadrants of alveoloplasty 3. Gross debridement of remaining dentition   SURGEON: Charlynne Pander, DDS  ASSISTANT: Rory Percy, (dental assistant)  ANESTHESIA: General anesthesia via oral endotracheal tube.  MEDICATIONS: 1. Ancef 2 g IV prior to invasive dental procedures. 2. Local anesthesia with a total utilization of 2 carpules each containing 34 mg of lidocaine with 0.017 mg of epinephrine   SPECIMENS: There are 2 teeth that were discarded.  DRAINS: None  CULTURES: None  COMPLICATIONS: None   ESTIMATED BLOOD LOSS: Less than 50 mLs.  INTRAVENOUS FLUIDS: 1000 mLs of Lactated ringers solution.  INDICATIONS: The patient was recently diagnosed with severe aortic regurgitation and coronary artery disease.  A medically necessary dental consultation was then requested to evaluate poor dentition.  The patient was examined and treatment planned for extraction of indicated teeth with alveoloplasty and gross debridement of remaining dentition in the operating room with general anesthesia.  This treatment plan was formulated to decrease the risks and complications associated with dental infection from affecting the patient's systemic health and the anticipated aortic  valve replacement and coronary artery bypass graft procedure.  OPERATIVE FINDINGS: Patient was examined operating room number 17.  The teeth were identified for extraction. Tooth numbers 2 and 12 are present as retained root segments and need extraction and the patient was further examined for additional dental problems. Distal caries was noted on tooth #13 but it was felt that this would be able to be restored in the future. The patient was noted be affected by chronic apical periodontitis, retained root segments, dental caries, chronic periodontitis, and accretions.   DESCRIPTION OF PROCEDURE: Patient was brought to the main operating room number 17. Patient was then placed in the supine position on the operating table. General anesthesia was then induced per the anesthesia team. The patient was then prepped and draped in the usual manner for dental medicine procedure. A timeout was performed. The patient was identified and procedures were verified. A throat pack was placed at this time. The oral cavity was then thoroughly examined with the findings noted above. The patient was then ready for dental medicine procedure as follows:  Local anesthesia was then administered sequentially with a total utilization of 2 carpules each containing 34 mg of lidocaine with 0.017 mg of epinephrine.  The Maxillary left and right quadrants first approached. Anesthesia was then delivered utilizing infiltration with lidocaine with epinephrine. A #15 blade incision was then made on the buccal and palatal aspects of tooth #2 . A  surgical flap was then carefully reflected. The tooth roots were then subluxated with a series of straight elevators. The retained roots were then removed with a rongeur without complications. Alveoloplasty was then performed utilizing a ronguers and bone file to assist in obtaining  primary closure. The surgical site was then irrigated with copious amounts of sterile saline. A piece of Surgifoam was  then placed in the extraction sockets appropriately. The surgical site was then closed from the mesial of #1 and extended to the distal of #4 utilizing 3-0 chromic gut suture in a continuous interrupted suture technique 1.  A 15 blade incision was then made on the buccal and palatal aspects of tooth #12. A surgical flap was then carefully reflected. The tooth root #12 was then subluxated with a series straight elevators. The buccal root was then removed with a rongeur without complication. Further bone was then removed around retained palatal root. Palatal root was then removed with a 150 forceps without further complication.  Alveoloplasty was then performed utilizing a rongeur and bone file to assist in obtaining primary closure. The surgical site was irrigated with copious amounts of sterile saline. Surgifoam was then placed in the extraction sockets appropriately. Surgical site was then closed from the mesial of #13 and extended to the distal of #11 utilizing 3-0 chromic gut suture in a continuous interrupted suture technique 1. One individual interrupted suture was then placed to further close the surgical site utilizing 3-0 chromic gut material.  At this point time the remaining dentition was approached. A sonic scaler was utilized to remove significant accretions. A series of hand curettes were then used to further remove accretions. A sonic scaler was then again used to refine removal of accretions. At this point time the gross debridement procedure was complete.  The entire mouth was irrigated with copious amounts of sterile saline. The patient was examined for complications, seeing none, the dental medicine procedure was deemed to be complete. The throat pack was removed at this time. An oral airway was then placed at the request of the anesthesia team. A series of 4 x 4 gauze were placed in the mouth to aid hemostasis. The patient was then handed over to the anesthesia team for final disposition.  After an appropriate amount of time, the patient was extubated and taken to the postanesthsia care unit in good condition. All counts were correct for the dental medicine procedure. Patient will now follow-up with anticipated heart valve surgery and coronary artery bypass graft procedure with Dr. Laneta Simmers at his discretion early next week.  Heparin subcutaneous therapy may be restarted in 12 hours. In the meantime, the patient will remain on SCDs.   Charlynne Pander, DDS.

## 2015-11-21 NOTE — Transfer of Care (Signed)
Immediate Anesthesia Transfer of Care Note  Patient: [name]  Procedure(s) Performed: Procedure(s): Extraction of tooth #'s 2,12 with alveoloplasty and gross debridement of remaining dentition (Bilateral)  Patient Location: PACU  Anesthesia Type:General  Level of Consciousness: awake, alert , oriented and patient cooperative  Airway & Oxygen Therapy: Patient Spontanous Breathing and Patient connected to nasal cannula oxygen  Post-op Assessment: Report given to RN, Post -op Vital signs reviewed and stable and Patient moving all extremities X 4  Post vital signs: Reviewed and stable  Last Vitals:  Filed Vitals:   11/20/15 2000 11/20/15 2342  BP: 133/70 139/83  Pulse: 66 65  Temp:  36.6 C  Resp: 29     Complications: No apparent anesthesia complications

## 2015-11-21 NOTE — Progress Notes (Signed)
   Subjective: Dental extraction of 2 molars this morning, tolerated well. Received 1L fluids for surgery overall good UOP and negative balance.  Objective: Vital signs in last 24 hours: Filed Vitals:   11/21/15 0915 11/21/15 0930 11/21/15 0938 11/21/15 1010  BP: 132/64 134/64 133/60 144/77  Pulse: 68 65 66 67  Temp:   98 F (36.7 C)   TempSrc:      Resp: 19 22 21 21   Height:      Weight:      SpO2: 100% 100% 100% 99%   Weight change:   Intake/Output Summary (Last 24 hours) at 11/21/15 1121 Last data filed at 11/21/15 0845  Gross per 24 hour  Intake   1680 ml  Output   2530 ml  Net   -850 ml   GENERAL- alert, co-operative, NAD HEENT- Atraumatic, oral mucosa appears moist CARDIAC- RRR, 2/6 early systolic murmur, 2/6 early diastolic murmur RESP- CTAB, no crackles or wheezes EXTREMITIES- symmetric, no pedal edema b/l SKIN- Warm, dry, No rash or lesion. PSYCH- Normal mood and affect, appropriate thought content and speech.  Medications: I have reviewed the patient's current medications. Scheduled Meds: . allopurinol  300 mg Oral Daily  . aspirin EC  81 mg Oral Daily  . atenolol  100 mg Oral Daily  . azelastine  1 spray Each Nare QODAY  . furosemide  40 mg Intravenous Q12H  . insulin aspart  0-9 Units Subcutaneous TID WC  . multivitamin with minerals  1 tablet Oral Daily  . ondansetron      . oxyCODONE-acetaminophen      . pantoprazole  40 mg Oral Daily  . pravastatin  20 mg Oral QHS  . sodium chloride flush  3 mL Intravenous Q12H   Continuous Infusions: . lactated ringers 10 mL/hr at 11/21/15 1019   PRN Meds:.sodium chloride, acetaminophen, ondansetron (ZOFRAN) IV, ondansetron, oxyCODONE-acetaminophen, sodium chloride flush Assessment/Plan: Critical coronary artery disease: Critical stenosis of mid LAD 90%, 1st Diag 80%, mid RCA 85%. Severe PA HTN 103/53, co-ox 53%. Patient should undergo CABG to minimize further progression of heart failure and his very high risk  of MI.  Progressing well with IV diuresis another -850cc balance despite 1L IV fluids for surgery. CABG and AVR surgeries planned for Tuesday 3/21. Small SCr increase 1.33->1.52 could be 2/2 diuresis or possible CIN with recent cath. Dental extractions today without apparent complication with perioperative ancef. -ASA 81mg  daily -Daily Bmet -Decrease lasix to 40mg  IV BID -continue cardiac monitoring -TCTS and cardiology recommendations appreciated  Severe aortic insufficiency 2/2 bicuspid aortic valve: Will need open repair of AV at time of CABG.  Hypertension: Will decrease atenolol as needed for pressures with continued IV diuresis.  AKI: SCr 1.52 today, up from 1.33 yesterday with Iv diuresis and 2 days post-cath.  T2DM: CBGs currently diet controlled in hospital  Dispo: Patient needs further inpatient management and planned surgery Tuesday 3/21 next week prior to safe discharge.  The patient does have a current PCP (Quentin Angst, MD) and does need an [facility] hospital follow-up appointment after discharge.  The patient does not have transportation limitations that hinder transportation to clinic appointments.  Fuller Plan, MD 11/21/2015, 11:21 AM

## 2015-11-21 NOTE — Progress Notes (Signed)
Subjective:  No complaints overnight Had dental surgery this am   Objective:  Vital Signs in the last 24 hours: Temp:  [97.5 F (36.4 C)-98 F (36.7 C)] 98 F (36.7 C) (03/17 0938) Pulse Rate:  [63-74] 67 (03/17 1010) Resp:  [14-33] 21 (03/17 1010) BP: (132-159)/(60-85) 144/77 mmHg (03/17 1010) SpO2:  [95 %-100 %] 99 % (03/17 1010)  Intake/Output from previous day:  Intake/Output Summary (Last 24 hours) at 11/21/15 1249 Last data filed at 11/21/15 0845  Gross per 24 hour  Intake   1240 ml  Output   2230 ml  Net   -990 ml    Physical Exam: General appearance: alert, cooperative and no distress Lungs: clear to auscultation bilaterally Heart: regular rate and rhythm and 2/6 systolic murmur, 7-5/1 diastolic murmur   Rate: 66  Rhythm: normal sinus rhythm  Lab Results:  Recent Labs  11/19/15 0245  WBC 8.2  HGB 14.2  PLT 293    Recent Labs  11/20/15 0300 11/21/15 0440  NA 140 140  K 4.4 4.0  CL 107 102  CO2 22 27  GLUCOSE 94 118*  BUN 23* 22*  CREATININE 1.33* 1.52*   No results for input(s): TROPONINI in the last 72 hours.  Invalid input(s): CK, MB  Recent Labs  11/19/15 0245  INR 1.14    Scheduled Meds: . allopurinol  300 mg Oral Daily  . aspirin EC  81 mg Oral Daily  . atenolol  100 mg Oral Daily  . azelastine  1 spray Each Nare QODAY  . furosemide  40 mg Intravenous Q12H  . insulin aspart  0-9 Units Subcutaneous TID WC  . multivitamin with minerals  1 tablet Oral Daily  . ondansetron      . oxyCODONE-acetaminophen      . pantoprazole  40 mg Oral Daily  . pravastatin  20 mg Oral QHS  . sodium chloride flush  3 mL Intravenous Q12H   Continuous Infusions: . lactated ringers 10 mL/hr at 11/21/15 1019   PRN Meds:.sodium chloride, acetaminophen, ondansetron (ZOFRAN) IV, ondansetron, oxyCODONE-acetaminophen, sodium chloride flush   Imaging: Imaging results have been reviewed  Cardiac Studies: Echo 11/17/15 Study Conclusions  -  Left ventricle: The cavity size was normal. There was mild  concentric hypertrophy. Systolic function was normal. The  estimated ejection fraction was in the range of 55% to 60%. Wall  motion was normal; there were no regional wall motion  abnormalities. Doppler parameters are consistent with high  ventricular filling pressure. - Aortic valve: Bicuspid; mildly thickened, mildly calcified  leaflets. Transvalvular velocity was minimally increased. There  was no stenosis. There was severe regurgitation directed towards  the mitral anterior leaflet. - Mitral valve: There was mild regurgitation. Regurgitation is  present during late diastole secondary to severe aortic  regurgitation. - Left atrium: The atrium was moderately dilated.  Impressions:  - When compared to prior, aortic regurgitation has progressed.  Assessment/Plan:  55 y/o AA male with a history of HTN, DM, HLD, and gout. He has known bicuspid AOV and AI. He recently had CHF and his AI was noted to be severe and we were asked to see him in consult.  Active Problems:   Bicuspid aortic valve   AKI (acute kidney injury) (HCC)   Controlled type 2 diabetes mellitus with diabetic nephropathy, without long-term current use of insulin (HCC)   Severe aortic regurgitation   Acute CHF    Chronic periodontitis   CAD, multiple vessel  PLAN: SCr is up a little-cath may need to be put off, will discuss with MD. He is for TEE tomorrow.    Luke Kilroy PA-C 11/21/2015, 12:49 PCorine [id number]  Attending Note:   The patient was seen and examined.  Agree with assessment and plan as noted above.  Changes made to the above note as needed.   1. CAD  At cath, patient was found to have critical CAD Will need CABG at the time of AVR  2. Bicuspid AV with severe AI For AVR  I have called TCTS and Dr. Laneta Simmers will see him today  Possible surgery next Tuesday   Needs diuresing for his severe pulmonary HTN Is net negative 3  liters so far this admission    Alvia Grove., MD, [facility] 11/21/2015, 12:49 PM [address]  [address] Office [phone] Pager [phone]

## 2015-11-22 DIAGNOSIS — I2510 Atherosclerotic heart disease of native coronary artery without angina pectoris: Secondary | ICD-10-CM | POA: Insufficient documentation

## 2015-11-22 LAB — GLUCOSE, CAPILLARY
GLUCOSE-CAPILLARY: 84 mg/dL (ref 65–99)
Glucose-Capillary: 98 mg/dL (ref 65–99)
Glucose-Capillary: 108 mg/dL — ABNORMAL HIGH (ref 65–99)
Glucose-Capillary: 124 mg/dL — ABNORMAL HIGH (ref 65–99)

## 2015-11-22 LAB — BASIC METABOLIC PANEL
ANION GAP: 14 (ref 5–15)
BUN: 14 mg/dL (ref 6–20)
CHLORIDE: 99 mmol/L — AB (ref 101–111)
CO2: 28 mmol/L (ref 22–32)
Calcium: 9.3 mg/dL (ref 8.9–10.3)
Creatinine, Ser: 1.29 mg/dL — ABNORMAL HIGH (ref 0.61–1.24)
GFR calc non Af Amer: >60 mL/min (ref >60)
Glucose, Bld: 94 mg/dL (ref 65–99)
Potassium: 3.5 mmol/L (ref 3.5–5.1)
Sodium: 141 mmol/L (ref 135–145)

## 2015-11-22 MED ORDER — METOPROLOL SUCCINATE ER 25 MG PO TB24
25.0000 mg | ORAL_TABLET | Freq: Every day | ORAL | Status: DC
Start: 2015-11-22 — End: 2015-11-25
  Administered 2015-11-22 – 2015-11-24 (×3): 25 mg via ORAL
  Filled 2015-11-22 (×3): qty 1

## 2015-11-22 MED ORDER — ATORVASTATIN CALCIUM 40 MG PO TABS
40.0000 mg | ORAL_TABLET | Freq: Every day | ORAL | Status: DC
  Administered 2015-11-22 – 2015-12-03 (×11): 40 mg via ORAL
  Filled 2015-11-22 (×11): qty 1

## 2015-11-22 MED ORDER — POTASSIUM CHLORIDE CRYS ER 20 MEQ PO TBCR
40.0000 meq | EXTENDED_RELEASE_TABLET | Freq: Once | ORAL | Status: AC
  Administered 2015-11-22: 40 meq via ORAL
  Filled 2015-11-22: qty 2

## 2015-11-22 MED ORDER — FUROSEMIDE 10 MG/ML IJ SOLN
80.0000 mg | Freq: Two times a day (BID) | INTRAMUSCULAR | Status: DC
  Administered 2015-11-22 – 2015-11-23 (×3): 80 mg via INTRAVENOUS
  Filled 2015-11-22 (×3): qty 8

## 2015-11-22 MED ORDER — HYDRALAZINE HCL 25 MG PO TABS
25.0000 mg | ORAL_TABLET | Freq: Three times a day (TID) | ORAL | Status: DC
  Administered 2015-11-22 – 2015-11-23 (×3): 25 mg via ORAL
  Filled 2015-11-22 (×3): qty 1

## 2015-11-22 NOTE — Progress Notes (Addendum)
CARDIAC REHAB PHASE I   PRE:  Rate/Rhythm: 78 SR  BP:   Sitting: 164/78     SaO2: 96% RA  MODE:  Ambulation: 270 ft   POST:  Rate/Rhythm: 82 SR  BP:   Sitting: 163/92     SaO2: 98% RA  1500-1524  Pt ambulated 270 ft without any assistance and with IV pole. Pt maintained steady gait. Returned pt to recliner with LE elevated, and VSS. Practiced sit to stand and bed mobility without use of hands. Did pre-op education. Discussed smoking cessation (marijuana-use) and pt is eager to stop.  Dwayne Larrabee D Latarra Eagleton,MS,ACSM-RCEP 11/22/2015 3:56 PM

## 2015-11-22 NOTE — Progress Notes (Signed)
Patient ambulated 3 full labs around the nursing unit without c/o shortness of breath, chest discomfort, or leg pain. Patient tolerated well.

## 2015-11-22 NOTE — Progress Notes (Signed)
Patient ID: [name], male   DOB: [birth year], 55 y.o.   MRN: [id number] Medicine attending: I examined this patient this morning together with resident physician Dr. Gara Kroner and I concur with her evaluation and management plan which we discussed together. He is clinically stable and asymptomatic at rest. Multiple dental extractions yesterday. Oropharynx appears unremarkable with no bleeding. He has a loud systolic murmur in the aortic area with a soft diastolic component. He is scheduled for coronary bypass surgery and aortic valve replacement next week.

## 2015-11-22 NOTE — Progress Notes (Signed)
Patient ID: [name], male   DOB: 17-Nov-1960, 55 y.o.   MRN: [id number]   SUBJECTIVE: No chest pain or dyspnea resting in bed.  He did not diurese well yesterday.   RHC (3/15) RA mean 25 PA 103/53 PCWP mean 33 CI 1.53  Scheduled Meds: . allopurinol  300 mg Oral Daily  . aspirin EC  81 mg Oral Daily  . atorvastatin  40 mg Oral q1800  . azelastine  1 spray Each Nare QODAY  . furosemide  80 mg Intravenous BID  . hydrALAZINE  25 mg Oral 3 times per day  . insulin aspart  0-9 Units Subcutaneous TID WC  . metoprolol succinate  25 mg Oral Daily  . multivitamin with minerals  1 tablet Oral Daily  . pantoprazole  40 mg Oral Daily  . potassium chloride  40 mEq Oral Once  . sodium chloride flush  3 mL Intravenous Q12H   Continuous Infusions: . lactated ringers 10 mL/hr at 11/21/15 2000   PRN Meds:.sodium chloride, acetaminophen, ondansetron (ZOFRAN) IV, ondansetron, oxyCODONE-acetaminophen, sodium chloride flush    Filed Vitals:   11/21/15 2000 11/21/15 2349 11/22/15 0423 11/22/15 0740  BP: 131/83 135/79 121/74 120/86  Pulse: 66  66 66  Temp:  97.9 F (36.6 C) 97.6 F (36.4 C) 98.2 F (36.8 C)  TempSrc:  Oral Oral Oral  Resp: 16 25 21 23   Height:      Weight:   213 lb 4.8 oz (96.752 kg)   SpO2: 98% 99% 97% 97%    Intake/Output Summary (Last 24 hours) at 11/22/15 1131 Last data filed at 11/22/15 0757  Gross per 24 hour  Intake 806.83 ml  Output   1800 ml  Net -993.17 ml    LABS: Basic Metabolic Panel:  Recent Labs  63/84/53 0440 11/22/15 0228  NA 140 141  K 4.0 3.5  CL 102 99*  CO2 27 28  GLUCOSE 118* 94  BUN 22* 14  CREATININE 1.52* 1.29*  CALCIUM 9.3 9.3   Liver Function Tests: No results for input(s): AST, ALT, ALKPHOS, BILITOT, PROT, ALBUMIN in the last 72 hours. No results for input(s): LIPASE, AMYLASE in the last 72 hours. CBC: No results for input(s): WBC, NEUTROABS, HGB, HCT, MCV, PLT in the last 72 hours. Cardiac Enzymes: No results for  input(s): CKTOTAL, CKMB, CKMBINDEX, TROPONINI in the last 72 hours. BNP: Invalid input(s): POCBNP D-Dimer: No results for input(s): DDIMER in the last 72 hours. Hemoglobin A1C: No results for input(s): HGBA1C in the last 72 hours. Fasting Lipid Panel: No results for input(s): CHOL, HDL, LDLCALC, TRIG, CHOLHDL, LDLDIRECT in the last 72 hours. Thyroid Function Tests: No results for input(s): TSH, T4TOTAL, T3FREE, THYROIDAB in the last 72 hours.  Invalid input(s): FREET3 Anemia Panel: No results for input(s): VITAMINB12, FOLATE, FERRITIN, TIBC, IRON, RETICCTPCT in the last 72 hours.  RADIOLOGY: Dg Orthopantogram  11/19/2015  CLINICAL DATA:  Dental caries EXAM: ORTHOPANTOGRAM/PANORAMIC COMPARISON:  None. FINDINGS: Several teeth are missing. There is a cavity in the superior right first molar. No obvious abscess or erosion seen. No fracture or dislocation. No bony destruction. IMPRESSION: Sizable cavity superior right first molar. No erosive change or bony destruction. No fracture or dislocation. Electronically Signed   By: Bretta Bang III M.D.   On: 11/19/2015 10:39   Dg Chest 2 View  11/15/2015  CLINICAL DATA:  Chest pain. Nausea and difficulty breathing with new medication. EXAM: CHEST - 2 VIEW COMPARISON:  Chest and right rib radiographs 11/29/2014.  FINDINGS: Mild cardiac enlargement is exaggerated by low lung volumes. Moderate pulmonary vascular congestion is present. There are no effusions. No focal airspace consolidation is present. IMPRESSION: 1. Borderline cardiomegaly and mild pulmonary vascular congestion. Electronically Signed   By: Marin Roberts M.D.   On: 11/15/2015 10:52   Dg Knee Complete 4 Views Left  11/12/2015  CLINICAL DATA:  Left knee pain and swelling EXAM: LEFT KNEE - COMPLETE 4+ VIEW COMPARISON:  None. FINDINGS: Diffuse soft tissue swelling is noted. Moderate suprapatellar joint effusion identified. No fractures or subluxations identified. There is no evidence  of arthropathy or other focal bone abnormality. Soft tissues are unremarkable. IMPRESSION: 1. Joint effusion. 2. Soft tissue swelling. Electronically Signed   By: Signa Kell M.D.   On: 11/12/2015 14:46    PHYSICAL EXAM General: NAD Neck: JVP 12 cm, no thyromegaly or thyroid nodule.  Lungs: Clear to auscultation bilaterally with normal respiratory effort. CV: Nondisplaced PMI.  Heart regular S1/S2, no S3/S4, 2/6 systolic murmur/2/6 diastolic murmur RUSB.  No peripheral edema.    Abdomen: Soft, nontender, no hepatosplenomegaly, no distention.  Neurologic: Alert and oriented x 3.  Psych: Normal affect. Extremities: No clubbing or cyanosis.   TELEMETRY: Reviewed telemetry pt in NSR  ASSESSMENT AND PLAN: 55 yo with bicuspid aortic valve and severe aortic insufficiency as well as severe CAD and acute diastolic CHF.  Plan for CABG-AVR next week.  1. CAD: Severe 3VD on cath this week.  No chest pain.  He will need CABG with AVR.  - Change statin to atorvastatin 40 daily.  - Continue ASA.  2. Acute diastolic CHF: EF 40% with severe AI. Markedly elevated filling pressures and low cardiac output on recent RHC.  He is comfortable but remains volume overloaded on exam.  - Stop atenolol, will use Toprol XL 25 daily.  - Increase Lasix to 80 mg IV bid.  3. Aortic insufficiency: Severe, in setting of bicuspid aortic valve.  Will add afterload reduction with hydralazine 25 mg tid and titrate up as able.  4. AKI: Creatinine better today.   [name] 11/22/2015 11:39 AM

## 2015-11-22 NOTE — Progress Notes (Signed)
   Subjective: No complaints. Denies tooth pain. States leg swelling has improved. Feels nervous about his upcoming sx Tuesday.   Objective: Vital signs in last 24 hours: Filed Vitals:   11/21/15 2000 11/21/15 2349 11/22/15 0423 11/22/15 0740  BP: 131/83 135/79 121/74 120/86  Pulse: 66  66 66  Temp:  97.9 F (36.6 C) 97.6 F (36.4 C) 98.2 F (36.8 C)  TempSrc:  Oral Oral Oral  Resp: 16 25 21 23   Height:      Weight:   213 lb 4.8 oz (96.752 kg)   SpO2: 98% 99% 97% 97%   Weight change:   Intake/Output Summary (Last 24 hours) at 11/22/15 1036 Last data filed at 11/22/15 0757  Gross per 24 hour  Intake 806.83 ml  Output   1800 ml  Net -993.17 ml   GENERAL- alert, co-operative, NAD, eating breakfast HEENT- Atraumatic, oral mucosa appears moist CARDIAC- RRR, 2/6 early systolic murmur, 2/6 early diastolic murmur RESP- CTAB, no crackles or wheezes EXTREMITIES- symmetric, no pedal edema b/l SKIN- Warm, dry, No rash or lesion. PSYCH- Normal mood and affect, appropriate thought content and speech.  Medications: I have reviewed the patient's current medications. Scheduled Meds: . allopurinol  300 mg Oral Daily  . aspirin EC  81 mg Oral Daily  . atenolol  100 mg Oral Daily  . azelastine  1 spray Each Nare QODAY  . furosemide  40 mg Intravenous Q12H  . insulin aspart  0-9 Units Subcutaneous TID WC  . multivitamin with minerals  1 tablet Oral Daily  . pantoprazole  40 mg Oral Daily  . pravastatin  20 mg Oral QHS  . sodium chloride flush  3 mL Intravenous Q12H   Continuous Infusions: . lactated ringers 10 mL/hr at 11/21/15 2000   PRN Meds:.sodium chloride, acetaminophen, ondansetron (ZOFRAN) IV, ondansetron, oxyCODONE-acetaminophen, sodium chloride flush Assessment/Plan: Critical coronary artery disease: Critical stenosis of mid LAD 90%, 1st Diag 80%, mid RCA 85%. Severe PA HTN 103/53, co-ox 53%. CABG and AVR surgeries planned for Tuesday 3/21. Vitals stable.  -ASA 81mg   daily -Daily Bmet -continue lasix 40mg  IV BID due to severe pulmonary HTN noted on cardiac cath -continue cardiac monitoring -TCTS and cardiology recommendations appreciated  Severe aortic insufficiency 2/2 bicuspid aortic valve: Will need open repair of AV at time of CABG.  Hypertension: Will decrease atenolol as needed for pressures with continued IV diuresis.  AKI: 2/2 IV diuresis and recent heart cath. Creatinine trending down, 1.29 from 1.52 yesterday.  - continue IV lasix   T2DM: CBGs currently diet controlled in hospital  Dispo: Patient needs further inpatient management and planned surgery Tuesday 3/21 next week prior to safe discharge.  The patient does have a current PCP (Quentin Angst, MD) and does need an [facility] hospital follow-up appointment after discharge.  The patient does not have transportation limitations that hinder transportation to clinic appointments.  Denton Brick, MD 11/22/2015, 10:36 AM

## 2015-11-23 ENCOUNTER — Inpatient Hospital Stay (HOSPITAL_COMMUNITY): Payer: Commercial Managed Care - HMO

## 2015-11-23 DIAGNOSIS — I351 Nonrheumatic aortic (valve) insufficiency: Secondary | ICD-10-CM

## 2015-11-23 DIAGNOSIS — I2510 Atherosclerotic heart disease of native coronary artery without angina pectoris: Secondary | ICD-10-CM

## 2015-11-23 LAB — CBC
HEMATOCRIT: 47.4 % (ref 39.0–52.0)
Hemoglobin: 15.8 g/dL (ref 13.0–17.0)
MCH: 29.8 pg (ref 26.0–34.0)
MCHC: 33.3 g/dL (ref 30.0–36.0)
MCV: 89.4 fL (ref 78.0–100.0)
PLATELETS: 299 10*3/uL (ref 150–400)
RBC: 5.3 MIL/uL (ref 4.22–5.81)
RDW: 14.1 % (ref 11.5–15.5)
WBC: 7.3 10*3/uL (ref 4.0–10.5)

## 2015-11-23 LAB — BASIC METABOLIC PANEL
Anion gap: 10 (ref 5–15)
BUN: 17 mg/dL (ref 6–20)
CHLORIDE: 99 mmol/L — AB (ref 101–111)
CO2: 29 mmol/L (ref 22–32)
CREATININE: 1.47 mg/dL — AB (ref 0.61–1.24)
Calcium: 9.6 mg/dL (ref 8.9–10.3)
GFR calc Af Amer: 60 mL/min — ABNORMAL LOW (ref >60)
GFR calc non Af Amer: 52 mL/min — ABNORMAL LOW (ref >60)
GLUCOSE: 111 mg/dL — AB (ref 65–99)
POTASSIUM: 4.1 mmol/L (ref 3.5–5.1)
SODIUM: 138 mmol/L (ref 135–145)

## 2015-11-23 LAB — GLUCOSE, CAPILLARY
GLUCOSE-CAPILLARY: 115 mg/dL — AB (ref 65–99)
GLUCOSE-CAPILLARY: 104 mg/dL — AB (ref 65–99)
Glucose-Capillary: 112 mg/dL — ABNORMAL HIGH (ref 65–99)
Glucose-Capillary: 99 mg/dL (ref 65–99)

## 2015-11-23 MED ORDER — HYDRALAZINE HCL 50 MG PO TABS
50.0000 mg | ORAL_TABLET | Freq: Three times a day (TID) | ORAL | Status: DC
  Administered 2015-11-23 – 2015-11-25 (×6): 50 mg via ORAL
  Filled 2015-11-23 (×6): qty 1

## 2015-11-23 MED ORDER — FUROSEMIDE 40 MG PO TABS
40.0000 mg | ORAL_TABLET | Freq: Every day | ORAL | Status: DC
Start: 2015-11-24 — End: 2015-11-24

## 2015-11-23 NOTE — Progress Notes (Addendum)
Patient ID: [name], male   DOB: 04/28/1961, 55 y.o.   MRN: [id number]   SUBJECTIVE: No chest pain or dyspnea resting in bed.  He diuresed very well yesterday.    RHC (3/15) RA mean 25 PA 103/53 PCWP mean 33 CI 1.53  Scheduled Meds: . allopurinol  300 mg Oral Daily  . aspirin EC  81 mg Oral Daily  . atorvastatin  40 mg Oral q1800  . azelastine  1 spray Each Nare QODAY  . [START ON 11/24/2015] furosemide  40 mg Oral Daily  . hydrALAZINE  50 mg Oral 3 times per day  . insulin aspart  0-9 Units Subcutaneous TID WC  . metoprolol succinate  25 mg Oral Daily  . multivitamin with minerals  1 tablet Oral Daily  . pantoprazole  40 mg Oral Daily  . sodium chloride flush  3 mL Intravenous Q12H   Continuous Infusions: . lactated ringers 10 mL/hr at 11/21/15 2000   PRN Meds:.sodium chloride, acetaminophen, ondansetron (ZOFRAN) IV, ondansetron, oxyCODONE-acetaminophen, sodium chloride flush    Filed Vitals:   11/22/15 2308 11/23/15 0323 11/23/15 0500 11/23/15 0823  BP: 131/66 132/65  147/75  Pulse:      Temp: 98.6 F (37 C) 98.6 F (37 C)  97.9 F (36.6 C)  TempSrc: Oral Oral  Oral  Resp: 22 19  18   Height:      Weight:   216 lb 14.9 oz (98.4 kg)   SpO2: 98% 96%      Intake/Output Summary (Last 24 hours) at 11/23/15 0958 Last data filed at 11/23/15 0938  Gross per 24 hour  Intake    600 ml  Output   4100 ml  Net  -3500 ml    LABS: Basic Metabolic Panel:  Recent Labs  98/26/41 0228 11/23/15 0239  NA 141 138  K 3.5 4.1  CL 99* 99*  CO2 28 29  GLUCOSE 94 111*  BUN 14 17  CREATININE 1.29* 1.47*  CALCIUM 9.3 9.6   Liver Function Tests: No results for input(s): AST, ALT, ALKPHOS, BILITOT, PROT, ALBUMIN in the last 72 hours. No results for input(s): LIPASE, AMYLASE in the last 72 hours. CBC:  Recent Labs  11/23/15 0239  WBC 7.3  HGB 15.8  HCT 47.4  MCV 89.4  PLT 299   Cardiac Enzymes: No results for input(s): CKTOTAL, CKMB, CKMBINDEX, TROPONINI in the  last 72 hours. BNP: Invalid input(s): POCBNP D-Dimer: No results for input(s): DDIMER in the last 72 hours. Hemoglobin A1C: No results for input(s): HGBA1C in the last 72 hours. Fasting Lipid Panel: No results for input(s): CHOL, HDL, LDLCALC, TRIG, CHOLHDL, LDLDIRECT in the last 72 hours. Thyroid Function Tests: No results for input(s): TSH, T4TOTAL, T3FREE, THYROIDAB in the last 72 hours.  Invalid input(s): FREET3 Anemia Panel: No results for input(s): VITAMINB12, FOLATE, FERRITIN, TIBC, IRON, RETICCTPCT in the last 72 hours.  RADIOLOGY: Dg Orthopantogram  11/19/2015  CLINICAL DATA:  Dental caries EXAM: ORTHOPANTOGRAM/PANORAMIC COMPARISON:  None. FINDINGS: Several teeth are missing. There is a cavity in the superior right first molar. No obvious abscess or erosion seen. No fracture or dislocation. No bony destruction. IMPRESSION: Sizable cavity superior right first molar. No erosive change or bony destruction. No fracture or dislocation. Electronically Signed   By: Bretta Bang III M.D.   On: 11/19/2015 10:39   Dg Chest 2 View  11/15/2015  CLINICAL DATA:  Chest pain. Nausea and difficulty breathing with new medication. EXAM: CHEST - 2 VIEW COMPARISON:  Chest and right rib radiographs 11/29/2014. FINDINGS: Mild cardiac enlargement is exaggerated by low lung volumes. Moderate pulmonary vascular congestion is present. There are no effusions. No focal airspace consolidation is present. IMPRESSION: 1. Borderline cardiomegaly and mild pulmonary vascular congestion. Electronically Signed   By: Marin Roberts M.D.   On: 11/15/2015 10:52   Dg Knee Complete 4 Views Left  11/12/2015  CLINICAL DATA:  Left knee pain and swelling EXAM: LEFT KNEE - COMPLETE 4+ VIEW COMPARISON:  None. FINDINGS: Diffuse soft tissue swelling is noted. Moderate suprapatellar joint effusion identified. No fractures or subluxations identified. There is no evidence of arthropathy or other focal bone abnormality. Soft  tissues are unremarkable. IMPRESSION: 1. Joint effusion. 2. Soft tissue swelling. Electronically Signed   By: Signa Kell M.D.   On: 11/12/2015 14:46    PHYSICAL EXAM General: NAD Neck: JVP 7-8 cm, no thyromegaly or thyroid nodule.  Lungs: Clear to auscultation bilaterally with normal respiratory effort. CV: Nondisplaced PMI.  Heart regular S1/S2, no S3/S4, 2/6 systolic murmur, 2/6 diastolic murmur RUSB.  No peripheral edema.    Abdomen: Soft, nontender, no hepatosplenomegaly, no distention.  Neurologic: Alert and oriented x 3.  Psych: Normal affect. Extremities: No clubbing or cyanosis.   TELEMETRY: Reviewed telemetry pt in NSR  ASSESSMENT AND PLAN: 55 yo with bicuspid aortic valve and severe aortic insufficiency as well as severe CAD and acute diastolic CHF.  Plan for CABG-AVR.  1. CAD: Severe 3VD on cath last week.  No chest pain.  He will need CABG with AVR (planned for Tuesday).  - Continue atorvastatin 40 daily.  - Continue ASA.  2. Acute diastolic CHF: EF 16% with severe AI. Markedly elevated filling pressures and low cardiac output on recent RHC.  He diuresed well yesterday.  Creatinine is up a bit and volume is improved on exam.  - Continue current Toprol XL.  - Got 1 dose IV lasix today, can stop IV Lasix now and transition to po for tomorrow.  3. Aortic insufficiency: Severe, in setting of bicuspid aortic valve.  Hydralazine started for afterload reduction, increase to 50 mg tid. AVR planned for Tuesday.  4. AKI: Creatinine a bit higher with diuresis.  As above, transitioning over to po Lasix.   [name] 11/23/2015 9:58 AM

## 2015-11-23 NOTE — Progress Notes (Signed)
.  VASCULAR LAB PRELIMINARY  PRELIMINARY  PRELIMINARY  PRELIMINARY  Pre-op Cardiac Surgery  Carotid Findings:  Bilateral:  1-39% ICA stenosis.  Vertebral artery flow is antegrade.     Upper Extremity Right Left  Brachial Pressures 139 Triphasic 134 Triphasic  Radial Waveforms Triphasic Triphasic  Ulnar Waveforms Triphasic Triphasic  Palmar Arch (Allen's Test) Normal Abnormal   Findings:  Doppler waveforms remained normal with both radial and ulnar compressions on the right. Left Doppler waveforms obliterate with radial compression and remain normal with ulnar compression.    Lower  Extremity Right Left  Dorsalis Pedis 168 Triphasic 128 Triphasic  Posterior Tibial 173 Triphasic 174 Triphasic  Ankle/Brachial Indices 1.2 1.3    Findings:  ABIs and Doppler waveforms indicate normal arterial flow bilaterally at rest.   Destanie Tibbetts, RVS 11/23/2015, 2:44 PM

## 2015-11-23 NOTE — Progress Notes (Signed)
   Subjective: Some left hand cramping overnight that lasted approximately 30 minutes before resolving spontaneously. No other complaints this morning.   Objective: Vital signs in last 24 hours: Filed Vitals:   11/22/15 2308 11/23/15 0323 11/23/15 0500 11/23/15 0823  BP: 131/66 132/65  147/75  Pulse:      Temp: 98.6 F (37 C) 98.6 F (37 C)  97.9 F (36.6 C)  TempSrc: Oral Oral  Oral  Resp: 22 19  18   Height:      Weight:   98.4 kg (216 lb 14.9 oz)   SpO2: 98% 96%     Weight change: 1.648 kg (3 lb 10.1 oz)  Intake/Output Summary (Last 24 hours) at 11/23/15 0957 Last data filed at 11/23/15 2130  Gross per 24 hour  Intake    600 ml  Output   4100 ml  Net  -3500 ml   GENERAL- alert, co-operative, NAD HEENT- Atraumatic, oral mucosa appears moist CARDIAC- RRR, 2/6 early systolic murmur, 2/6 early diastolic murmur RESP- CTAB, no crackles or wheezes EXTREMITIES- symmetric, no pedal edema b/l SKIN- Warm, dry PSYCH- Normal mood and affect, appropriate thought content and speech.  Medications: I have reviewed the patient's current medications. Scheduled Meds: . allopurinol  300 mg Oral Daily  . aspirin EC  81 mg Oral Daily  . atorvastatin  40 mg Oral q1800  . azelastine  1 spray Each Nare QODAY  . furosemide  80 mg Intravenous BID  . hydrALAZINE  50 mg Oral 3 times per day  . insulin aspart  0-9 Units Subcutaneous TID WC  . metoprolol succinate  25 mg Oral Daily  . multivitamin with minerals  1 tablet Oral Daily  . pantoprazole  40 mg Oral Daily  . sodium chloride flush  3 mL Intravenous Q12H   Continuous Infusions: . lactated ringers 10 mL/hr at 11/21/15 2000   PRN Meds:.sodium chloride, acetaminophen, ondansetron (ZOFRAN) IV, ondansetron, oxyCODONE-acetaminophen, sodium chloride flush Assessment/Plan: Critical coronary artery disease: Critical stenosis of mid LAD 90%, 1st Diag 80%, mid RCA 85%. Severe PA HTN 103/53, co-ox 53%. CABG and AVR surgeries planned for Tuesday  3/21. Vitals stable. Diuresing for high PA pressures prior to surgery. -ASA 81mg  daily -Daily Bmet -continue cardiac monitoring -TCTS and cardiology recommendations appreciated  AKI: 2/2 IV diuresis and recent heart cath. 1.29->1.47 today, continuing significant negative balance. Will follow and decrease lasix dosing as needed for worsening or more cramping. - continue IV lasix    Severe aortic insufficiency 2/2 bicuspid aortic valve: Planned AV repair at time of CABG. Hypertension: Will decrease atenolol as needed for pressures with continued IV diuresis. T2DM: CBGs currently diet controlled in hospital  Dispo: Patient needs further inpatient management and planned surgery Tuesday 3/21 next week prior to safe discharge.  The patient does have a current PCP (Dwayne Angst, MD) and does need an [facility] hospital follow-up appointment after discharge.  Fuller Plan, MD 11/23/2015, 9:57 AM

## 2015-11-24 ENCOUNTER — Inpatient Hospital Stay (HOSPITAL_COMMUNITY): Payer: Commercial Managed Care - HMO

## 2015-11-24 ENCOUNTER — Encounter (HOSPITAL_COMMUNITY): Payer: Self-pay | Admitting: Dentistry

## 2015-11-24 DIAGNOSIS — M10062 Idiopathic gout, left knee: Secondary | ICD-10-CM

## 2015-11-24 DIAGNOSIS — I2510 Atherosclerotic heart disease of native coronary artery without angina pectoris: Secondary | ICD-10-CM

## 2015-11-24 DIAGNOSIS — I351 Nonrheumatic aortic (valve) insufficiency: Secondary | ICD-10-CM

## 2015-11-24 DIAGNOSIS — I5031 Acute diastolic (congestive) heart failure: Secondary | ICD-10-CM

## 2015-11-24 LAB — BASIC METABOLIC PANEL
Anion gap: 13 (ref 5–15)
BUN: 19 mg/dL (ref 6–20)
CALCIUM: 9.7 mg/dL (ref 8.9–10.3)
CHLORIDE: 99 mmol/L — AB (ref 101–111)
CO2: 26 mmol/L (ref 22–32)
CREATININE: 1.53 mg/dL — AB (ref 0.61–1.24)
GFR calc Af Amer: 57 mL/min — ABNORMAL LOW (ref >60)
GFR calc non Af Amer: 49 mL/min — ABNORMAL LOW (ref >60)
GLUCOSE: 102 mg/dL — AB (ref 65–99)
Potassium: 4.3 mmol/L (ref 3.5–5.1)
Sodium: 138 mmol/L (ref 135–145)

## 2015-11-24 LAB — CBC
HCT: 48.6 % (ref 39.0–52.0)
Hemoglobin: 15.6 g/dL (ref 13.0–17.0)
MCH: 29.1 pg (ref 26.0–34.0)
MCHC: 32.1 g/dL (ref 30.0–36.0)
MCV: 90.7 fL (ref 78.0–100.0)
PLATELETS: 325 10*3/uL (ref 150–400)
RBC: 5.36 MIL/uL (ref 4.22–5.81)
RDW: 14.5 % (ref 11.5–15.5)
WBC: 7 10*3/uL (ref 4.0–10.5)

## 2015-11-24 LAB — GLUCOSE, CAPILLARY
GLUCOSE-CAPILLARY: 110 mg/dL — AB (ref 65–99)
GLUCOSE-CAPILLARY: 88 mg/dL (ref 65–99)
Glucose-Capillary: 84 mg/dL (ref 65–99)
Glucose-Capillary: 141 mg/dL — ABNORMAL HIGH (ref 65–99)

## 2015-11-24 LAB — TYPE AND SCREEN
ABO/RH(D): B POS
ANTIBODY SCREEN: NEGATIVE

## 2015-11-24 MED ORDER — SODIUM CHLORIDE 0.9 % IV SOLN
INTRAVENOUS | Status: DC
  Filled 2015-11-24: qty 2.5

## 2015-11-24 MED ORDER — SODIUM CHLORIDE 0.9 % IV SOLN
INTRAVENOUS | Status: DC
  Filled 2015-11-24: qty 40

## 2015-11-24 MED ORDER — COLCHICINE 0.6 MG PO TABS
0.6000 mg | ORAL_TABLET | Freq: Two times a day (BID) | ORAL | Status: AC
  Administered 2015-11-24 (×2): 0.6 mg via ORAL
  Filled 2015-11-24 (×2): qty 1

## 2015-11-24 MED ORDER — CEFUROXIME SODIUM 1.5 G IJ SOLR
1.5000 g | INTRAMUSCULAR | Status: DC
  Filled 2015-11-24: qty 1.5

## 2015-11-24 MED ORDER — PREDNISONE 20 MG PO TABS: 20.0000 mg | ORAL_TABLET | Freq: Every day | ORAL | Status: DC

## 2015-11-24 MED ORDER — VANCOMYCIN HCL 10 G IV SOLR
1250.0000 mg | INTRAVENOUS | Status: DC
  Filled 2015-11-24: qty 1250

## 2015-11-24 MED ORDER — METOPROLOL TARTRATE 12.5 MG HALF TABLET
12.5000 mg | ORAL_TABLET | Freq: Once | ORAL | Status: AC
  Administered 2015-11-25: 12.5 mg via ORAL
  Filled 2015-11-24: qty 1

## 2015-11-24 MED ORDER — TEMAZEPAM 15 MG PO CAPS: 15.0000 mg | ORAL_CAPSULE | Freq: Once | ORAL | Status: DC | PRN

## 2015-11-24 MED ORDER — MAGNESIUM SULFATE 50 % IJ SOLN
40.0000 meq | INTRAMUSCULAR | Status: DC
  Filled 2015-11-24: qty 10

## 2015-11-24 MED ORDER — NITROGLYCERIN IN D5W 200-5 MCG/ML-% IV SOLN
2.0000 ug/min | INTRAVENOUS | Status: DC
  Filled 2015-11-24: qty 250

## 2015-11-24 MED ORDER — BISACODYL 5 MG PO TBEC: 5.0000 mg | DELAYED_RELEASE_TABLET | Freq: Once | ORAL | Status: DC

## 2015-11-24 MED ORDER — POTASSIUM CHLORIDE 2 MEQ/ML IV SOLN
80.0000 meq | INTRAVENOUS | Status: DC
  Filled 2015-11-24: qty 40

## 2015-11-24 MED ORDER — DIAZEPAM 5 MG PO TABS
5.0000 mg | ORAL_TABLET | Freq: Once | ORAL | Status: AC
  Administered 2015-11-25: 5 mg via ORAL
  Filled 2015-11-24: qty 1

## 2015-11-24 MED ORDER — SODIUM CHLORIDE 0.9 % IV SOLN
INTRAVENOUS | Status: DC
  Filled 2015-11-24: qty 30

## 2015-11-24 MED ORDER — COLCHICINE 0.6 MG PO TABS: 0.6000 mg | ORAL_TABLET | Freq: Every day | ORAL | Status: DC

## 2015-11-24 MED ORDER — CHLORHEXIDINE GLUCONATE 0.12 % MT SOLN
15.0000 mL | Freq: Once | OROMUCOSAL | Status: AC
  Administered 2015-11-25: 15 mL via OROMUCOSAL
  Filled 2015-11-24: qty 15

## 2015-11-24 MED ORDER — CHLORHEXIDINE GLUCONATE CLOTH 2 % EX PADS
6.0000 | MEDICATED_PAD | Freq: Once | CUTANEOUS | Status: AC
  Administered 2015-11-25: 6 via TOPICAL

## 2015-11-24 MED ORDER — PLASMA-LYTE 148 IV SOLN
INTRAVENOUS | Status: DC
  Filled 2015-11-24: qty 2.5

## 2015-11-24 MED ORDER — DEXMEDETOMIDINE HCL IN NACL 400 MCG/100ML IV SOLN
0.1000 ug/kg/h | INTRAVENOUS | Status: DC
  Filled 2015-11-24: qty 100

## 2015-11-24 MED ORDER — CHLORHEXIDINE GLUCONATE 0.12 % MT SOLN
15.0000 mL | Freq: Two times a day (BID) | OROMUCOSAL | Status: DC
  Administered 2015-11-24 (×2): 15 mL via OROMUCOSAL
  Filled 2015-11-24 (×2): qty 15

## 2015-11-24 MED ORDER — DOPAMINE-DEXTROSE 3.2-5 MG/ML-% IV SOLN
0.0000 ug/kg/min | INTRAVENOUS | Status: DC
  Filled 2015-11-24: qty 250

## 2015-11-24 MED ORDER — CHLORHEXIDINE GLUCONATE CLOTH 2 % EX PADS
6.0000 | MEDICATED_PAD | Freq: Once | CUTANEOUS | Status: AC
  Administered 2015-11-24: 6 via TOPICAL

## 2015-11-24 MED ORDER — EPINEPHRINE HCL 1 MG/ML IJ SOLN
0.0000 ug/min | INTRAVENOUS | Status: DC
  Filled 2015-11-24: qty 4

## 2015-11-24 MED ORDER — DEXTROSE 5 % IV SOLN
750.0000 mg | INTRAVENOUS | Status: DC
  Filled 2015-11-24: qty 750

## 2015-11-24 MED ORDER — PHENYLEPHRINE HCL 10 MG/ML IJ SOLN
30.0000 ug/min | INTRAVENOUS | Status: DC
  Filled 2015-11-24: qty 2

## 2015-11-24 NOTE — Progress Notes (Signed)
Internal Medicine Attending:   I saw and examined the patient. I reviewed the resident's note and I agree with the resident's findings and plan as documented in the resident's note.  Patient continues to do well with minimal symptoms from severe aortic insufficiency and critical coronary artery disease. I think he is safe to proceed for surgery tomorrow for aortic valve replacement and bypass grafting. Only complaint today was that he is feeling the early onset of gout flare in his left knee. We'll try to treat this with colchicine and oxycodone to help support his mobilization early postoperatively. I anticipate he will be transferring to the cardiothoracic surgery team tomorrow after the surgery. Please call the general medicine teaching service if you like [place] to follow along in consultation.

## 2015-11-24 NOTE — Progress Notes (Signed)
POST OPERATIVE NOTE:  11/24/2015 [name] [id number]  VITALS: BP 157/86 mmHg  Pulse 84  Temp(Src) 97.5 F (36.4 C) (Oral)  Resp 20  Ht 5\' 4"  (1.626 m)  Wt 207 lb 4.8 oz (94.031 kg)  BMI 35.57 kg/m2  SpO2 98%  LABS:  Lab Results  Component Value Date   WBC 7.0 11/24/2015   HGB 15.6 11/24/2015   HCT 48.6 11/24/2015   MCV 90.7 11/24/2015   PLT 325 11/24/2015   BMET    Component Value Date/Time   NA 138 11/24/2015 0229   K 4.3 11/24/2015 0229   CL 99* 11/24/2015 0229   CO2 26 11/24/2015 0229   GLUCOSE 102* 11/24/2015 0229   BUN 19 11/24/2015 0229   CREATININE 1.53* 11/24/2015 0229   CREATININE 1.05 02/24/2015 1601   CALCIUM 9.7 11/24/2015 0229   GFRNONAA 49* 11/24/2015 0229   GFRNONAA 80 02/24/2015 1601   GFRAA 57* 11/24/2015 0229   GFRAA >89 02/24/2015 1601    Lab Results  Component Value Date   INR 1.14 11/19/2015   INR 1.02 05/27/2010   No results found for: PTT   [name] is status post multiple extractions with alveoloplasty and gross debridement of teeth in the OR on 11/21/15.  SUBJECTIVE: Patient denies having problems or pain from extractions ites.  EXAM: No sign of oral infection, heme, or ooze. Sutures loosely intact. Clots present.  ASSESSMENT: Post operative course is consistent with dental procedures performed in the OR.   PLAN: 1. Continue salt water rinses as needed to aid healing. 2. Use Chlorhexidine rinses as prescribed 3. Call for follow up appointment with Dental Medicine if needed. 4. F/U with primary Dentist in 3-6 months once medically stable for exam, radiographs and discussion of dental treatment needs. 5. Use antibiotic premedication for future invasive dental procedures per AHA guidelines.   Charlynne Pander, DDS

## 2015-11-24 NOTE — Care Management Important Message (Signed)
Important Message  Patient Details  Name: [name] MRN: [id number] Date of Birth: 11/16/1960   Medicare Important Message Given:  Yes    [name] Short 11/24/2015, 2:40 PM

## 2015-11-24 NOTE — Progress Notes (Signed)
3 Days Post-Op Procedure(s) (LRB): Extraction of tooth #'s 2,12 with alveoloplasty and gross debridement of remaining dentition (Bilateral) Subjective:  No complaints of chest pain or dyspnea.  Objective: Vital signs in last 24 hours: Temp:  [97.5 F (36.4 C)-99.7 F (37.6 C)] 98.5 F (36.9 C) (03/20 1954) Pulse Rate:  [73-84] 73 (03/20 1543) Cardiac Rhythm:  [-] Normal sinus rhythm (03/20 1200) Resp:  [16-20] 20 (03/20 1954) BP: (121-167)/(53-90) 167/83 mmHg (03/20 1954) SpO2:  [97 %-99 %] 98 % (03/20 1954) Weight:  [94.031 kg (207 lb 4.8 oz)] 94.031 kg (207 lb 4.8 oz) (03/20 0522)  Hemodynamic parameters for last 24 hours:    Intake/Output from previous day: 03/19 0701 - 03/20 0700 In: 720 [P.O.:720] Out: 1600 [Urine:1600] Intake/Output this shift: Total I/O In: 200 [P.O.:200] Out: -   General appearance: alert and cooperative Heart: regular rate and rhythm. 3/6 systolic murmur and 2/6 diastolic murmur  Lungs: clear to auscultation bilaterally  Lab Results:  Recent Labs  11/23/15 0239 11/24/15 0229  WBC 7.3 7.0  HGB 15.8 15.6  HCT 47.4 48.6  PLT 299 325   BMET:  Recent Labs  11/23/15 0239 11/24/15 0229  NA 138 138  K 4.1 4.3  CL 99* 99*  CO2 29 26  GLUCOSE 111* 102*  BUN 17 19  CREATININE 1.47* 1.53*  CALCIUM 9.6 9.7    PT/INR: No results for input(s): LABPROT, INR in the last 72 hours. ABG    Component Value Date/Time   PHART 7.212* 11/19/2015 1741   HCO3 15.9* 11/19/2015 1741   TCO2 17 11/19/2015 1741   ACIDBASEDEF 11.0* 11/19/2015 1741   O2SAT 99.0 11/19/2015 1741   CBG (last 3)   Recent Labs  11/24/15 0842 11/24/15 1125 11/24/15 1657  GLUCAP 110* 84 141*    Assessment/Plan:  Severe multi-vessel CAD and severe AI. Plan CABG and AVR tomorrow. I discussed the valve prosthesis options of mechanical and tissue valves. He is only 22 but is not sure that he can reliably take coumadin daily and adhere to strict medical follow up of INR.  I think that a tissue valve is probably a better option for him and he agrees. He understands the risk of future structural valve deterioration. I discussed the operative procedure with the patient and family including alternatives, benefits and risks; including but not limited to bleeding, blood transfusion, infection, stroke, myocardial infarction, graft failure, heart block requiring a permanent pacemaker, organ dysfunction, and death.  [name] understands and agrees to proceed.  Plan surgery in the am.  LOS: 5 days    [name] 11/24/2015

## 2015-11-24 NOTE — Progress Notes (Signed)
Patient Name:  [name], DOB: [birth year], MRN: [id number] Primary Doctor: Jeanann Lewandowsky, MD Primary Cardiologist:   Date: 11/24/2015   SUBJECTIVE: Patient is stable this morning. He had some discomfort in his knee earlier today. He feels better now. He is not having any shortness of breath.   Past Medical History  Diagnosis Date  . Hypertension   . Hypercholesteremia   . Baker's cyst of knee   . Type II diabetes mellitus (HCC)   . GERD (gastroesophageal reflux disease)   . Gouty arthritis     "real bad" (01/17/2013)   Filed Vitals:   11/23/15 2047 11/24/15 0026 11/24/15 0522 11/24/15 0604  BP:  140/70 150/82 121/53  Pulse:      Temp: 99.7 F (37.6 C) 98.3 F (36.8 C) 98.2 F (36.8 C)   TempSrc: Oral Oral Oral   Resp:      Height:      Weight:   207 lb 4.8 oz (94.031 kg)   SpO2:  99% 98%     Intake/Output Summary (Last 24 hours) at 11/24/15 0708 Last data filed at 11/24/15 0400  Gross per 24 hour  Intake    720 ml  Output   1600 ml  Net   -880 ml   Filed Weights   11/22/15 0423 11/23/15 0500 11/24/15 0522  Weight: 213 lb 4.8 oz (96.752 kg) 216 lb 14.9 oz (98.4 kg) 207 lb 4.8 oz (94.031 kg)     LABS: Basic Metabolic Panel:  Recent Labs  41/63/84 0239 11/24/15 0229  NA 138 138  K 4.1 4.3  CL 99* 99*  CO2 29 26  GLUCOSE 111* 102*  BUN 17 19  CREATININE 1.47* 1.53*  CALCIUM 9.6 9.7   Liver Function Tests: No results for input(s): AST, ALT, ALKPHOS, BILITOT, PROT, ALBUMIN in the last 72 hours. No results for input(s): LIPASE, AMYLASE in the last 72 hours. CBC:  Recent Labs  11/23/15 0239 11/24/15 0229  WBC 7.3 7.0  HGB 15.8 15.6  HCT 47.4 48.6  MCV 89.4 90.7  PLT 299 325   Cardiac Enzymes: No results for input(s): CKTOTAL, CKMB, CKMBINDEX, TROPONINI in the last 72 hours. BNP: Invalid input(s): POCBNP D-Dimer: No results for input(s): DDIMER in the last 72 hours. Thyroid Function Tests: No results for input(s): TSH,  T4TOTAL, T3FREE, THYROIDAB in the last 72 hours.  Invalid input(s): FREET3  RADIOLOGY: Dg Orthopantogram  11/19/2015  CLINICAL DATA:  Dental caries EXAM: ORTHOPANTOGRAM/PANORAMIC COMPARISON:  None. FINDINGS: Several teeth are missing. There is a cavity in the superior right first molar. No obvious abscess or erosion seen. No fracture or dislocation. No bony destruction. IMPRESSION: Sizable cavity superior right first molar. No erosive change or bony destruction. No fracture or dislocation. Electronically Signed   By: Bretta Bang III M.D.   On: 11/19/2015 10:39   Dg Chest 2 View  11/15/2015  CLINICAL DATA:  Chest pain. Nausea and difficulty breathing with new medication. EXAM: CHEST - 2 VIEW COMPARISON:  Chest and right rib radiographs 11/29/2014. FINDINGS: Mild cardiac enlargement is exaggerated by low lung volumes. Moderate pulmonary vascular congestion is present. There are no effusions. No focal airspace consolidation is present. IMPRESSION: 1. Borderline cardiomegaly and mild pulmonary vascular congestion. Electronically Signed   By: Marin Roberts M.D.   On: 11/15/2015 10:52   Dg Knee Complete 4 Views Left  11/12/2015  CLINICAL DATA:  Left knee pain and swelling EXAM: LEFT KNEE - COMPLETE 4+ VIEW COMPARISON:  None. FINDINGS: Diffuse soft tissue swelling is noted. Moderate suprapatellar joint effusion identified. No fractures or subluxations identified. There is no evidence of arthropathy or other focal bone abnormality. Soft tissues are unremarkable. IMPRESSION: 1. Joint effusion. 2. Soft tissue swelling. Electronically Signed   By: Signa Kell M.D.   On: 11/12/2015 14:46    PHYSICAL EXAM   patient is comfortable. Lungs reveal a few scattered rhonchi. Cardiac exam reveals S1 and S2 and his murmur of AI. There is no significant peripheral edema.   TELEMETRY: I have reviewed telemetry today November 24, 2015. There is normal sinus rhythm.    ASSESSMENT AND PLAN:    Acute kidney  injury (HCC)    Creatinine is slightly higher today. His overall input and output was slightly negative yesterday. Originally the plan was to start oral Lasix today. I have discontinued his Lasix.    Bicuspid aortic valve   Severe aortic regurgitation   CAD (coronary artery disease)      The patient is scheduled to have bypass surgery and aortic valve replacement tomorrow.    Acute diastolic CHF (congestive heart failure) (HCC)     Volume status appears to be stable. I have stopped his diuretic this morning with slight increase in creatinine.   Willa Rough 11/24/2015 7:08 AM

## 2015-11-24 NOTE — Progress Notes (Signed)
   Subjective: Some pain in his left knee worsened with weight bearing. He feels this is similar to gout flares he had in the past. Some left hand cramping lasting less than an hour.  Objective: Vital signs in last 24 hours: Filed Vitals:   11/23/15 2047 11/24/15 0026 11/24/15 0522 11/24/15 0604  BP:  140/70 150/82 121/53  Pulse:      Temp: 99.7 F (37.6 C) 98.3 F (36.8 C) 98.2 F (36.8 C)   TempSrc: Oral Oral Oral   Resp:      Height:      Weight:   94.031 kg (207 lb 4.8 oz)   SpO2:  99% 98%    Weight change: -4.369 kg (-9 lb 10.1 oz)  Intake/Output Summary (Last 24 hours) at 11/24/15 0825 Last data filed at 11/24/15 0400  Gross per 24 hour  Intake    720 ml  Output   1600 ml  Net   -880 ml   GENERAL- alert, co-operative, NAD HEENT- Atraumatic, oral mucosa appears moist CARDIAC- RRR, 2/6 early systolic murmur, 2/6 early diastolic murmur RESP- CTAB, no crackles or wheezes EXTREMITIES- symmetric, no pedal edema b/l SKIN- Warm, dry PSYCH- Normal mood and affect, appropriate thought content and speech.  Medications: I have reviewed the patient's current medications. Scheduled Meds: . allopurinol  300 mg Oral Daily  . aspirin EC  81 mg Oral Daily  . atorvastatin  40 mg Oral q1800  . azelastine  1 spray Each Nare QODAY  . hydrALAZINE  50 mg Oral 3 times per day  . insulin aspart  0-9 Units Subcutaneous TID WC  . metoprolol succinate  25 mg Oral Daily  . multivitamin with minerals  1 tablet Oral Daily  . pantoprazole  40 mg Oral Daily  . predniSONE  20 mg Oral Q breakfast  . sodium chloride flush  3 mL Intravenous Q12H   Continuous Infusions: . lactated ringers 10 mL/hr at 11/21/15 2000   PRN Meds:.sodium chloride, acetaminophen, ondansetron (ZOFRAN) IV, ondansetron, oxyCODONE-acetaminophen, sodium chloride flush Assessment/Plan: Critical coronary artery disease: Critical stenosis of mid LAD 90%, 1st Diag 80%, mid RCA 85%. Severe PA HTN 103/53, co-ox 53%. Severe and  progressive AI 2/2 bicupsid aortic valve with calcifications. CABG and AVR surgeries planned for Tuesday 3/21. Vitals stable.  -ASA 81mg  daily -Daily Bmet -continue cardiac monitoring -TCTS and cardiology recommendations appreciated  AKI: 2/2 IV diuresis and recent heart cath. 1.29->1.47->1.53 today, he is now net negative 8 liters. Now depleted with prerenal AKI, will hold any further diuretics.  Gout flare: Left knee pain precipitated by AKI from aggressive diuresis for his severe PA HTN. Will start on colchicine as renal impairment is not very severe at this time, and most likely to improve with relaxing diuresis/fluid balance. -Colchicine 0.6mg  twice today then daily   Dispo: Patient needs further inpatient management and planned surgery Tuesday 3/21 next week prior to safe discharge.  The patient does have a current PCP (Quentin Angst, MD) and does need an [facility] hospital follow-up appointment after discharge.  Fuller Plan, MD 11/24/2015, 8:25 AM

## 2015-11-25 ENCOUNTER — Encounter (HOSPITAL_COMMUNITY)
Admission: EM | Disposition: A | Discharge status: Home / Self Care | Payer: Self-pay | Source: Home / Self Care | Attending: Surgery

## 2015-11-25 ENCOUNTER — Inpatient Hospital Stay (HOSPITAL_COMMUNITY): Payer: Commercial Managed Care - HMO

## 2015-11-25 ENCOUNTER — Inpatient Hospital Stay (HOSPITAL_COMMUNITY): Payer: Commercial Managed Care - HMO | Admitting: Anesthesiology

## 2015-11-25 DIAGNOSIS — Z952 Presence of prosthetic heart valve: Secondary | ICD-10-CM

## 2015-11-25 HISTORY — PX: CORONARY ARTERY BYPASS GRAFT: SHX141

## 2015-11-25 HISTORY — PX: AORTIC VALVE REPLACEMENT: SHX41

## 2015-11-25 HISTORY — PX: TEE WITHOUT CARDIOVERSION: SHX5443

## 2015-11-25 LAB — CBC
HCT: 47.8 % (ref 39.0–52.0)
HEMATOCRIT: 25.1 % — AB (ref 39.0–52.0)
HEMOGLOBIN: 15.6 g/dL (ref 13.0–17.0)
Hemoglobin: 8 g/dL — ABNORMAL LOW (ref 13.0–17.0)
MCH: 29.4 pg (ref 26.0–34.0)
MCH: 28.7 pg (ref 26.0–34.0)
MCHC: 32.6 g/dL (ref 30.0–36.0)
MCHC: 31.9 g/dL (ref 30.0–36.0)
MCV: 90 fL (ref 78.0–100.0)
MCV: 90 fL (ref 78.0–100.0)
PLATELETS: 344 10*3/uL (ref 150–400)
Platelets: 172 10*3/uL (ref 150–400)
RBC: 5.31 MIL/uL (ref 4.22–5.81)
RBC: 2.79 MIL/uL — ABNORMAL LOW (ref 4.22–5.81)
RDW: 14.3 % (ref 11.5–15.5)
RDW: 14.3 % (ref 11.5–15.5)
WBC: 7.8 10*3/uL (ref 4.0–10.5)
WBC: 15.9 10*3/uL — ABNORMAL HIGH (ref 4.0–10.5)

## 2015-11-25 LAB — POCT I-STAT, CHEM 8
BUN: 19 mg/dL (ref 6–20)
BUN: 19 mg/dL (ref 6–20)
BUN: 18 mg/dL (ref 6–20)
BUN: 17 mg/dL (ref 6–20)
BUN: 14 mg/dL (ref 6–20)
BUN: 16 mg/dL (ref 6–20)
BUN: 14 mg/dL (ref 6–20)
BUN: 13 mg/dL (ref 6–20)
CALCIUM ION: 1.23 mmol/L (ref 1.12–1.23)
CALCIUM ION: 1.05 mmol/L — AB (ref 1.12–1.23)
CALCIUM ION: 0.96 mmol/L — AB (ref 1.12–1.23)
CALCIUM ION: 0.96 mmol/L — AB (ref 1.12–1.23)
CALCIUM ION: 1.08 mmol/L — AB (ref 1.12–1.23)
CHLORIDE: 102 mmol/L (ref 101–111)
CHLORIDE: 101 mmol/L (ref 101–111)
CHLORIDE: 98 mmol/L — AB (ref 101–111)
CHLORIDE: 100 mmol/L — AB (ref 101–111)
CHLORIDE: 102 mmol/L (ref 101–111)
CREATININE: 1 mg/dL (ref 0.61–1.24)
CREATININE: 0.9 mg/dL (ref 0.61–1.24)
Calcium, Ion: 1.29 mmol/L — ABNORMAL HIGH (ref 1.12–1.23)
Calcium, Ion: 1.23 mmol/L (ref 1.12–1.23)
Calcium, Ion: 0.91 mmol/L — ABNORMAL LOW (ref 1.12–1.23)
Chloride: 100 mmol/L — ABNORMAL LOW (ref 101–111)
Chloride: 98 mmol/L — ABNORMAL LOW (ref 101–111)
Chloride: 105 mmol/L (ref 101–111)
Creatinine, Ser: 0.9 mg/dL (ref 0.61–1.24)
Creatinine, Ser: 0.8 mg/dL (ref 0.61–1.24)
Creatinine, Ser: 0.8 mg/dL (ref 0.61–1.24)
Creatinine, Ser: 0.9 mg/dL (ref 0.61–1.24)
Creatinine, Ser: 0.9 mg/dL (ref 0.61–1.24)
Creatinine, Ser: 0.9 mg/dL (ref 0.61–1.24)
GLUCOSE: 116 mg/dL — AB (ref 65–99)
GLUCOSE: 104 mg/dL — AB (ref 65–99)
GLUCOSE: 150 mg/dL — AB (ref 65–99)
GLUCOSE: 145 mg/dL — AB (ref 65–99)
Glucose, Bld: 143 mg/dL — ABNORMAL HIGH (ref 65–99)
Glucose, Bld: 117 mg/dL — ABNORMAL HIGH (ref 65–99)
Glucose, Bld: 117 mg/dL — ABNORMAL HIGH (ref 65–99)
Glucose, Bld: 110 mg/dL — ABNORMAL HIGH (ref 65–99)
HCT: 47 % (ref 39.0–52.0)
HCT: 33 % — ABNORMAL LOW (ref 39.0–52.0)
HCT: 38 % — ABNORMAL LOW (ref 39.0–52.0)
HEMATOCRIT: 52 % (ref 39.0–52.0)
HEMATOCRIT: 47 % (ref 39.0–52.0)
HEMATOCRIT: 36 % — AB (ref 39.0–52.0)
HEMATOCRIT: 33 % — AB (ref 39.0–52.0)
HEMATOCRIT: 29 % — AB (ref 39.0–52.0)
HEMOGLOBIN: 17.7 g/dL — AB (ref 13.0–17.0)
HEMOGLOBIN: 16 g/dL (ref 13.0–17.0)
HEMOGLOBIN: 12.9 g/dL — AB (ref 13.0–17.0)
HEMOGLOBIN: 9.9 g/dL — AB (ref 13.0–17.0)
Hemoglobin: 16 g/dL (ref 13.0–17.0)
Hemoglobin: 12.2 g/dL — ABNORMAL LOW (ref 13.0–17.0)
Hemoglobin: 11.2 g/dL — ABNORMAL LOW (ref 13.0–17.0)
Hemoglobin: 11.2 g/dL — ABNORMAL LOW (ref 13.0–17.0)
POTASSIUM: 4.2 mmol/L (ref 3.5–5.1)
POTASSIUM: 4.5 mmol/L (ref 3.5–5.1)
POTASSIUM: 4.5 mmol/L (ref 3.5–5.1)
Potassium: 4.7 mmol/L (ref 3.5–5.1)
Potassium: 5.3 mmol/L — ABNORMAL HIGH (ref 3.5–5.1)
Potassium: 5.8 mmol/L — ABNORMAL HIGH (ref 3.5–5.1)
Potassium: 3.8 mmol/L (ref 3.5–5.1)
Potassium: 3.7 mmol/L (ref 3.5–5.1)
SODIUM: 138 mmol/L (ref 135–145)
SODIUM: 139 mmol/L (ref 135–145)
SODIUM: 135 mmol/L (ref 135–145)
SODIUM: 135 mmol/L (ref 135–145)
SODIUM: 140 mmol/L (ref 135–145)
Sodium: 137 mmol/L (ref 135–145)
Sodium: 135 mmol/L (ref 135–145)
Sodium: 143 mmol/L (ref 135–145)
TCO2: 31 mmol/L (ref 0–100)
TCO2: 29 mmol/L (ref 0–100)
TCO2: 29 mmol/L (ref 0–100)
TCO2: 27 mmol/L (ref 0–100)
TCO2: 26 mmol/L (ref 0–100)
TCO2: 28 mmol/L (ref 0–100)
TCO2: 24 mmol/L (ref 0–100)
TCO2: 25 mmol/L (ref 0–100)

## 2015-11-25 LAB — POCT I-STAT 3, ART BLOOD GAS (G3+)
ACID-BASE DEFICIT: 2 mmol/L (ref 0.0–2.0)
ACID-BASE DEFICIT: 1 mmol/L (ref 0.0–2.0)
ACID-BASE EXCESS: 2 mmol/L (ref 0.0–2.0)
BICARBONATE: 25.9 meq/L — AB (ref 20.0–24.0)
BICARBONATE: 25.2 meq/L — AB (ref 20.0–24.0)
Bicarbonate: 23.8 mEq/L (ref 20.0–24.0)
O2 SAT: 100 %
O2 SAT: 98 %
O2 SAT: 86 %
PCO2 ART: 44.4 mmHg (ref 35.0–45.0)
PO2 ART: 345 mmHg — AB (ref 80.0–100.0)
TCO2: 27 mmol/L (ref 0–100)
TCO2: 25 mmol/L (ref 0–100)
TCO2: 27 mmol/L (ref 0–100)
pCO2 arterial: 32.1 mmHg — ABNORMAL LOW (ref 35.0–45.0)
pCO2 arterial: 45.1 mmHg — ABNORMAL HIGH (ref 35.0–45.0)
pH, Arterial: 7.502 — ABNORMAL HIGH (ref 7.350–7.450)
pH, Arterial: 7.339 — ABNORMAL LOW (ref 7.350–7.450)
pH, Arterial: 7.347 — ABNORMAL LOW (ref 7.350–7.450)
pO2, Arterial: 104 mmHg — ABNORMAL HIGH (ref 80.0–100.0)
pO2, Arterial: 49 mmHg — ABNORMAL LOW (ref 80.0–100.0)

## 2015-11-25 LAB — COMPREHENSIVE METABOLIC PANEL
ALK PHOS: 88 U/L (ref 38–126)
ALT: 12 U/L — AB (ref 17–63)
AST: 29 U/L (ref 15–41)
Albumin: 3 g/dL — ABNORMAL LOW (ref 3.5–5.0)
Anion gap: 12 (ref 5–15)
BUN: 18 mg/dL (ref 6–20)
CHLORIDE: 101 mmol/L (ref 101–111)
CO2: 25 mmol/L (ref 22–32)
CREATININE: 1.38 mg/dL — AB (ref 0.61–1.24)
Calcium: 9.7 mg/dL (ref 8.9–10.3)
GFR calc Af Amer: >60 mL/min (ref >60)
GFR, EST NON AFRICAN AMERICAN: 56 mL/min — AB (ref >60)
GLUCOSE: 130 mg/dL — AB (ref 65–99)
Potassium: 4.3 mmol/L (ref 3.5–5.1)
Sodium: 138 mmol/L (ref 135–145)
Total Bilirubin: 0.5 mg/dL (ref 0.3–1.2)
Total Protein: 6.7 g/dL (ref 6.5–8.1)

## 2015-11-25 LAB — POCT I-STAT 4, (NA,K, GLUC, HGB,HCT)
GLUCOSE: 102 mg/dL — AB (ref 65–99)
HEMATOCRIT: 26 % — AB (ref 39.0–52.0)
Hemoglobin: 8.8 g/dL — ABNORMAL LOW (ref 13.0–17.0)
Potassium: 3.9 mmol/L (ref 3.5–5.1)
Sodium: 144 mmol/L (ref 135–145)

## 2015-11-25 LAB — URINE MICROSCOPIC-ADD ON

## 2015-11-25 LAB — SURGICAL PCR SCREEN
MRSA, PCR: NEGATIVE
STAPHYLOCOCCUS AUREUS: NEGATIVE

## 2015-11-25 LAB — URINALYSIS, ROUTINE W REFLEX MICROSCOPIC
Bilirubin Urine: NEGATIVE
GLUCOSE, UA: NEGATIVE mg/dL
Hgb urine dipstick: NEGATIVE
KETONES UR: NEGATIVE mg/dL
NITRITE: NEGATIVE
PH: 6.5 (ref 5.0–8.0)
Protein, ur: >300 mg/dL — AB
Specific Gravity, Urine: 1.019 (ref 1.005–1.030)

## 2015-11-25 LAB — APTT
APTT: 37 s (ref 24–37)
aPTT: 37 seconds (ref 24–37)

## 2015-11-25 LAB — PROTIME-INR
INR: 1.82 — AB (ref 0.00–1.49)
INR: 1.54 — ABNORMAL HIGH (ref 0.00–1.49)
Prothrombin Time: 21 seconds — ABNORMAL HIGH (ref 11.6–15.2)
Prothrombin Time: 18.5 seconds — ABNORMAL HIGH (ref 11.6–15.2)

## 2015-11-25 LAB — POCT I-STAT 3, VENOUS BLOOD GAS (G3P V)
Acid-base deficit: 1 mmol/L (ref 0.0–2.0)
BICARBONATE: 23.4 meq/L (ref 20.0–24.0)
O2 SAT: 90 %
PCO2 VEN: 30.8 mmHg — AB (ref 45.0–50.0)
PH VEN: 7.473 — AB (ref 7.250–7.300)
PO2 VEN: 45 mmHg (ref 31.0–45.0)
Patient temperature: 33.6
TCO2: 24 mmol/L (ref 0–100)

## 2015-11-25 LAB — HEMOGLOBIN AND HEMATOCRIT, BLOOD
HEMATOCRIT: 34.2 % — AB (ref 39.0–52.0)
HEMATOCRIT: 35.1 % — AB (ref 39.0–52.0)
HEMOGLOBIN: 11.4 g/dL — AB (ref 13.0–17.0)
Hemoglobin: 11.1 g/dL — ABNORMAL LOW (ref 13.0–17.0)

## 2015-11-25 LAB — PLATELET COUNT
PLATELETS: 244 10*3/uL (ref 150–400)
Platelets: 146 10*3/uL — ABNORMAL LOW (ref 150–400)

## 2015-11-25 LAB — GLUCOSE, CAPILLARY
Glucose-Capillary: 113 mg/dL — ABNORMAL HIGH (ref 65–99)
Glucose-Capillary: 111 mg/dL — ABNORMAL HIGH (ref 65–99)
Glucose-Capillary: 120 mg/dL — ABNORMAL HIGH (ref 65–99)
Glucose-Capillary: 118 mg/dL — ABNORMAL HIGH (ref 65–99)

## 2015-11-25 LAB — ABO/RH: ABO/RH(D): B POS

## 2015-11-25 LAB — FIBRINOGEN: Fibrinogen: 420 mg/dL (ref 204–475)

## 2015-11-25 SURGERY — CORONARY ARTERY BYPASS GRAFTING (CABG)
Anesthesia: General | Site: Chest

## 2015-11-25 MED ORDER — POTASSIUM CHLORIDE 2 MEQ/ML IV SOLN
80.0000 meq | INTRAVENOUS | Status: DC
  Filled 2015-11-25: qty 40

## 2015-11-25 MED ORDER — DEXTROSE 5 % IV SOLN
750.0000 mg | INTRAVENOUS | Status: DC
  Filled 2015-11-25: qty 750

## 2015-11-25 MED ORDER — THROMBIN 20000 UNITS EX SOLR
CUTANEOUS | Status: AC
  Filled 2015-11-25: qty 20000

## 2015-11-25 MED ORDER — DOPAMINE-DEXTROSE 3.2-5 MG/ML-% IV SOLN
0.0000 ug/kg/min | INTRAVENOUS | Status: DC
Start: 2015-11-25 — End: 2015-11-25

## 2015-11-25 MED ORDER — PROTAMINE SULFATE 10 MG/ML IV SOLN
INTRAVENOUS | Status: AC
  Filled 2015-11-25: qty 10

## 2015-11-25 MED ORDER — DEXTROSE 5 % IV SOLN
1.5000 g | Freq: Two times a day (BID) | INTRAVENOUS | Status: AC
  Administered 2015-11-25 – 2015-11-27 (×4): 1.5 g via INTRAVENOUS
  Filled 2015-11-25 (×4): qty 1.5

## 2015-11-25 MED ORDER — VANCOMYCIN HCL 10 G IV SOLR
1250.0000 mg | INTRAVENOUS | Status: AC
  Administered 2015-11-25: 1250 mg via INTRAVENOUS
  Filled 2015-11-25: qty 1250

## 2015-11-25 MED ORDER — ALBUMIN HUMAN 5 % IV SOLN
250.0000 mL | INTRAVENOUS | Status: AC | PRN
  Administered 2015-11-25 (×2): 250 mL via INTRAVENOUS

## 2015-11-25 MED ORDER — FENTANYL CITRATE (PF) 250 MCG/5ML IJ SOLN
INTRAMUSCULAR | Status: AC
  Filled 2015-11-25: qty 5

## 2015-11-25 MED ORDER — DEXMEDETOMIDINE HCL IN NACL 400 MCG/100ML IV SOLN
0.1000 ug/kg/h | INTRAVENOUS | Status: AC
  Administered 2015-11-25: .3 ug/kg/h via INTRAVENOUS
  Filled 2015-11-25: qty 100

## 2015-11-25 MED ORDER — PLASMA-LYTE 148 IV SOLN
INTRAVENOUS | Status: AC
  Administered 2015-11-25: 500 mL
  Filled 2015-11-25: qty 2.5

## 2015-11-25 MED ORDER — HEPARIN SODIUM (PORCINE) 1000 UNIT/ML IJ SOLN
INTRAMUSCULAR | Status: AC
  Filled 2015-11-25: qty 1

## 2015-11-25 MED ORDER — SODIUM CHLORIDE 0.9 % IV SOLN
INTRAVENOUS | Status: DC
  Filled 2015-11-25: qty 30

## 2015-11-25 MED ORDER — ROCURONIUM BROMIDE 50 MG/5ML IV SOLN
INTRAVENOUS | Status: AC
  Filled 2015-11-25: qty 2

## 2015-11-25 MED ORDER — MORPHINE SULFATE (PF) 2 MG/ML IV SOLN
1.0000 mg | INTRAVENOUS | Status: DC | PRN
  Administered 2015-11-25 – 2015-11-26 (×3): 4 mg via INTRAVENOUS
  Filled 2015-11-25 (×4): qty 2

## 2015-11-25 MED ORDER — MAGNESIUM SULFATE 50 % IJ SOLN
40.0000 meq | INTRAMUSCULAR | Status: DC
  Filled 2015-11-25: qty 10

## 2015-11-25 MED ORDER — METOPROLOL TARTRATE 1 MG/ML IV SOLN
2.5000 mg | INTRAVENOUS | Status: DC | PRN
  Administered 2015-11-26 – 2015-11-29 (×4): 5 mg via INTRAVENOUS
  Filled 2015-11-25 (×5): qty 5

## 2015-11-25 MED ORDER — MIDAZOLAM HCL 2 MG/2ML IJ SOLN: 2.0000 mg | INTRAMUSCULAR | Status: DC | PRN

## 2015-11-25 MED ORDER — PHENYLEPHRINE HCL 10 MG/ML IJ SOLN
0.0000 ug/min | INTRAVENOUS | Status: DC
  Filled 2015-11-25: qty 2

## 2015-11-25 MED ORDER — ACETAMINOPHEN 160 MG/5ML PO SOLN: 650.0000 mg | Freq: Once | ORAL | Status: AC

## 2015-11-25 MED ORDER — TRAMADOL HCL 50 MG PO TABS
50.0000 mg | ORAL_TABLET | ORAL | Status: DC | PRN
  Administered 2015-11-26: 100 mg via ORAL
  Filled 2015-11-25 (×2): qty 2

## 2015-11-25 MED ORDER — LACTATED RINGERS IV SOLN
INTRAVENOUS | Status: DC | PRN
  Administered 2015-11-25: 07:00:00 via INTRAVENOUS

## 2015-11-25 MED ORDER — 0.9 % SODIUM CHLORIDE (POUR BTL) OPTIME
TOPICAL | Status: DC | PRN
  Administered 2015-11-25: 5000 mL

## 2015-11-25 MED ORDER — DOPAMINE-DEXTROSE 3.2-5 MG/ML-% IV SOLN
INTRAVENOUS | Status: DC | PRN
  Administered 2015-11-25: 3 ug/kg/min via INTRAVENOUS

## 2015-11-25 MED ORDER — PHENYLEPHRINE HCL 10 MG/ML IJ SOLN
10.0000 mg | INTRAVENOUS | Status: DC | PRN
  Administered 2015-11-25: 30 ug/min via INTRAVENOUS

## 2015-11-25 MED ORDER — METOPROLOL TARTRATE 12.5 MG HALF TABLET: 12.5000 mg | ORAL_TABLET | Freq: Two times a day (BID) | ORAL | Status: DC

## 2015-11-25 MED ORDER — BISACODYL 5 MG PO TBEC
10.0000 mg | DELAYED_RELEASE_TABLET | Freq: Every day | ORAL | Status: DC
  Administered 2015-11-26: 10 mg via ORAL
  Filled 2015-11-25: qty 2

## 2015-11-25 MED ORDER — METOPROLOL TARTRATE 25 MG/10 ML ORAL SUSPENSION: 12.5000 mg | Freq: Two times a day (BID) | ORAL | Status: DC

## 2015-11-25 MED ORDER — INSULIN REGULAR BOLUS VIA INFUSION
0.0000 [IU] | Freq: Three times a day (TID) | INTRAVENOUS | Status: DC
  Administered 2015-11-26: 2 [IU] via INTRAVENOUS
  Administered 2015-11-26: 4 [IU] via INTRAVENOUS
  Filled 2015-11-25: qty 10

## 2015-11-25 MED ORDER — LIDOCAINE HCL (CARDIAC) 20 MG/ML IV SOLN
INTRAVENOUS | Status: AC
  Filled 2015-11-25: qty 5

## 2015-11-25 MED ORDER — NITROGLYCERIN IN D5W 200-5 MCG/ML-% IV SOLN
0.0000 ug/min | INTRAVENOUS | Status: DC
  Administered 2015-11-27: 55 ug/min via INTRAVENOUS
  Filled 2015-11-25: qty 250

## 2015-11-25 MED ORDER — SODIUM CHLORIDE 0.45 % IV SOLN
INTRAVENOUS | Status: DC | PRN
  Administered 2015-11-25: 10 mL/h via INTRAVENOUS

## 2015-11-25 MED ORDER — DEXMEDETOMIDINE HCL IN NACL 200 MCG/50ML IV SOLN: 0.0000 ug/kg/h | INTRAVENOUS | Status: DC

## 2015-11-25 MED ORDER — SODIUM CHLORIDE 0.9 % IV SOLN: 250.0000 mL | INTRAVENOUS | Status: DC

## 2015-11-25 MED ORDER — PROPOFOL 10 MG/ML IV BOLUS
INTRAVENOUS | Status: AC
  Filled 2015-11-25: qty 20

## 2015-11-25 MED ORDER — DEXTROSE 5 % IV SOLN
1.5000 g | INTRAVENOUS | Status: AC
  Administered 2015-11-25: .75 g via INTRAVENOUS
  Administered 2015-11-25: 1.5 g via INTRAVENOUS
  Filled 2015-11-25: qty 1.5

## 2015-11-25 MED ORDER — DEXMEDETOMIDINE HCL IN NACL 400 MCG/100ML IV SOLN
0.4000 ug/kg/h | INTRAVENOUS | Status: DC
  Filled 2015-11-25: qty 100

## 2015-11-25 MED ORDER — MIDAZOLAM HCL 10 MG/2ML IJ SOLN
INTRAMUSCULAR | Status: AC
  Filled 2015-11-25: qty 2

## 2015-11-25 MED ORDER — SUCCINYLCHOLINE CHLORIDE 20 MG/ML IJ SOLN
INTRAMUSCULAR | Status: DC | PRN
  Administered 2015-11-25: 100 mg via INTRAVENOUS

## 2015-11-25 MED ORDER — SODIUM CHLORIDE 0.9% FLUSH: 3.0000 mL | INTRAVENOUS | Status: DC | PRN

## 2015-11-25 MED ORDER — MILRINONE IN DEXTROSE 20 MG/100ML IV SOLN
INTRAVENOUS | Status: DC | PRN
  Administered 2015-11-25: 0.25 ug/kg/min via INTRAVENOUS

## 2015-11-25 MED ORDER — ARTIFICIAL TEARS OP OINT
TOPICAL_OINTMENT | OPHTHALMIC | Status: DC | PRN
  Administered 2015-11-25: 1 via OPHTHALMIC

## 2015-11-25 MED ORDER — VANCOMYCIN HCL IN DEXTROSE 1-5 GM/200ML-% IV SOLN
1000.0000 mg | Freq: Once | INTRAVENOUS | Status: AC
  Administered 2015-11-25: 1000 mg via INTRAVENOUS
  Filled 2015-11-25: qty 200

## 2015-11-25 MED ORDER — ACETAMINOPHEN 500 MG PO TABS
1000.0000 mg | ORAL_TABLET | Freq: Four times a day (QID) | ORAL | Status: DC
  Administered 2015-11-25 – 2015-11-29 (×15): 1000 mg via ORAL
  Filled 2015-11-25 (×15): qty 2

## 2015-11-25 MED ORDER — FENTANYL CITRATE (PF) 100 MCG/2ML IJ SOLN
INTRAMUSCULAR | Status: DC | PRN
  Administered 2015-11-25: 150 ug via INTRAVENOUS
  Administered 2015-11-25: 100 ug via INTRAVENOUS
  Administered 2015-11-25: 50 ug via INTRAVENOUS
  Administered 2015-11-25: 100 ug via INTRAVENOUS
  Administered 2015-11-25: 250 ug via INTRAVENOUS
  Administered 2015-11-25: 50 ug via INTRAVENOUS
  Administered 2015-11-25: 150 ug via INTRAVENOUS
  Administered 2015-11-25: 250 ug via INTRAVENOUS
  Administered 2015-11-25 (×2): 50 ug via INTRAVENOUS
  Administered 2015-11-25: 100 ug via INTRAVENOUS
  Administered 2015-11-25: 150 ug via INTRAVENOUS
  Administered 2015-11-25 (×3): 250 ug via INTRAVENOUS
  Administered 2015-11-25 (×2): 100 ug via INTRAVENOUS
  Administered 2015-11-25 (×2): 50 ug via INTRAVENOUS
  Administered 2015-11-25: 250 ug via INTRAVENOUS

## 2015-11-25 MED ORDER — ASPIRIN EC 325 MG PO TBEC
325.0000 mg | DELAYED_RELEASE_TABLET | Freq: Every day | ORAL | Status: DC
  Administered 2015-11-26 – 2015-11-29 (×4): 325 mg via ORAL
  Filled 2015-11-25 (×4): qty 1

## 2015-11-25 MED ORDER — PROTAMINE SULFATE 10 MG/ML IV SOLN
INTRAVENOUS | Status: AC
  Filled 2015-11-25: qty 25

## 2015-11-25 MED ORDER — ACETAMINOPHEN 650 MG RE SUPP
650.0000 mg | Freq: Once | RECTAL | Status: AC
  Administered 2015-11-25: 650 mg via RECTAL

## 2015-11-25 MED ORDER — SODIUM CHLORIDE 0.9 % IJ SOLN
INTRAMUSCULAR | Status: AC
  Filled 2015-11-25: qty 20

## 2015-11-25 MED ORDER — CHLORHEXIDINE GLUCONATE 0.12 % MT SOLN: 15.0000 mL | OROMUCOSAL | Status: AC

## 2015-11-25 MED ORDER — LACTATED RINGERS IV SOLN: INTRAVENOUS | Status: DC

## 2015-11-25 MED ORDER — SODIUM CHLORIDE 0.9 % IV SOLN
INTRAVENOUS | Status: AC
  Administered 2015-11-25: 69.8 mL/h via INTRAVENOUS
  Filled 2015-11-25: qty 40

## 2015-11-25 MED ORDER — NITROGLYCERIN IN D5W 200-5 MCG/ML-% IV SOLN
2.0000 ug/min | INTRAVENOUS | Status: DC
Start: 2015-11-25 — End: 2015-11-25
  Filled 2015-11-25: qty 250

## 2015-11-25 MED ORDER — MILRINONE IN DEXTROSE 20 MG/100ML IV SOLN
0.1250 ug/kg/min | INTRAVENOUS | Status: DC
  Filled 2015-11-25: qty 100

## 2015-11-25 MED ORDER — ACETAMINOPHEN 160 MG/5ML PO SOLN: 1000.0000 mg | Freq: Four times a day (QID) | ORAL | Status: DC

## 2015-11-25 MED ORDER — FENTANYL CITRATE (PF) 100 MCG/2ML IJ SOLN
50.0000 ug | INTRAMUSCULAR | Status: DC | PRN
  Administered 2015-11-26 – 2015-11-27 (×9): 100 ug via INTRAVENOUS
  Administered 2015-11-28: 50 ug via INTRAVENOUS
  Filled 2015-11-25 (×10): qty 2

## 2015-11-25 MED ORDER — SUCCINYLCHOLINE CHLORIDE 20 MG/ML IJ SOLN
INTRAMUSCULAR | Status: AC
  Filled 2015-11-25: qty 1

## 2015-11-25 MED ORDER — ROCURONIUM BROMIDE 100 MG/10ML IV SOLN
INTRAVENOUS | Status: DC | PRN
  Administered 2015-11-25 (×3): 50 mg via INTRAVENOUS
  Administered 2015-11-25: 70 mg via INTRAVENOUS
  Administered 2015-11-25: 50 mg via INTRAVENOUS
  Administered 2015-11-25: 30 mg via INTRAVENOUS

## 2015-11-25 MED ORDER — ARTIFICIAL TEARS OP OINT
TOPICAL_OINTMENT | OPHTHALMIC | Status: AC
  Filled 2015-11-25: qty 3.5

## 2015-11-25 MED ORDER — THROMBIN 20000 UNITS EX SOLR: CUTANEOUS | Status: DC | PRN

## 2015-11-25 MED ORDER — NITROGLYCERIN IN D5W 200-5 MCG/ML-% IV SOLN
INTRAVENOUS | Status: DC | PRN
  Administered 2015-11-25: 10 ug/min via INTRAVENOUS

## 2015-11-25 MED ORDER — LACTATED RINGERS IV SOLN
500.0000 mL | Freq: Once | INTRAVENOUS | Status: AC | PRN
  Administered 2015-11-25: 500 mL via INTRAVENOUS

## 2015-11-25 MED ORDER — NITROGLYCERIN 0.2 MG/ML ON CALL CATH LAB
INTRAVENOUS | Status: DC | PRN
  Administered 2015-11-25: 40 ug via INTRAVENOUS
  Administered 2015-11-25: 20 ug via INTRAVENOUS
  Administered 2015-11-25: 40 ug via INTRAVENOUS

## 2015-11-25 MED ORDER — ALBUMIN HUMAN 5 % IV SOLN
INTRAVENOUS | Status: DC | PRN
  Administered 2015-11-25 (×2): via INTRAVENOUS

## 2015-11-25 MED ORDER — FENTANYL CITRATE (PF) 250 MCG/5ML IJ SOLN
INTRAMUSCULAR | Status: AC
  Filled 2015-11-25: qty 30

## 2015-11-25 MED ORDER — CALCIUM CHLORIDE 10 % IV SOLN
INTRAVENOUS | Status: DC | PRN
  Administered 2015-11-25 (×2): 1 g via INTRAVENOUS

## 2015-11-25 MED ORDER — CALCIUM CHLORIDE 10 % IV SOLN
INTRAVENOUS | Status: AC
  Filled 2015-11-25: qty 20

## 2015-11-25 MED ORDER — LIDOCAINE HCL (CARDIAC) 20 MG/ML IV SOLN
INTRAVENOUS | Status: DC | PRN
  Administered 2015-11-25: 80 mg via INTRAVENOUS

## 2015-11-25 MED ORDER — EPINEPHRINE HCL 1 MG/ML IJ SOLN
0.0000 ug/min | INTRAVENOUS | Status: DC
  Filled 2015-11-25: qty 4

## 2015-11-25 MED ORDER — MIDAZOLAM HCL 2 MG/2ML IJ SOLN
INTRAMUSCULAR | Status: AC
  Filled 2015-11-25: qty 2

## 2015-11-25 MED ORDER — FAMOTIDINE IN NACL 20-0.9 MG/50ML-% IV SOLN
20.0000 mg | Freq: Two times a day (BID) | INTRAVENOUS | Status: DC
  Administered 2015-11-25: 20 mg via INTRAVENOUS

## 2015-11-25 MED ORDER — ALBUMIN HUMAN 5 % IV SOLN
INTRAVENOUS | Status: DC | PRN
  Administered 2015-11-25 (×5): via INTRAVENOUS

## 2015-11-25 MED ORDER — BISACODYL 10 MG RE SUPP: 10.0000 mg | Freq: Every day | RECTAL | Status: DC

## 2015-11-25 MED ORDER — MILRINONE IN DEXTROSE 20 MG/100ML IV SOLN
0.3000 ug/kg/min | INTRAVENOUS | Status: DC
  Administered 2015-11-26: 0.25 ug/kg/min via INTRAVENOUS
  Filled 2015-11-25: qty 100

## 2015-11-25 MED ORDER — MIDAZOLAM HCL 5 MG/5ML IJ SOLN
INTRAMUSCULAR | Status: DC | PRN
  Administered 2015-11-25 (×2): 1 mg via INTRAVENOUS
  Administered 2015-11-25 (×3): 2 mg via INTRAVENOUS
  Administered 2015-11-25: 1 mg via INTRAVENOUS
  Administered 2015-11-25: 3 mg via INTRAVENOUS
  Administered 2015-11-25: 4 mg via INTRAVENOUS

## 2015-11-25 MED ORDER — DOCUSATE SODIUM 100 MG PO CAPS
200.0000 mg | ORAL_CAPSULE | Freq: Every day | ORAL | Status: DC
  Administered 2015-11-26: 200 mg via ORAL
  Filled 2015-11-25: qty 2

## 2015-11-25 MED ORDER — PROPOFOL 10 MG/ML IV BOLUS
INTRAVENOUS | Status: DC | PRN
  Administered 2015-11-25: 60 mg via INTRAVENOUS

## 2015-11-25 MED ORDER — MIDAZOLAM HCL 2 MG/2ML IJ SOLN
INTRAMUSCULAR | Status: AC
  Filled 2015-11-25: qty 4

## 2015-11-25 MED ORDER — ONDANSETRON HCL 4 MG/2ML IJ SOLN
4.0000 mg | Freq: Four times a day (QID) | INTRAMUSCULAR | Status: DC | PRN
  Administered 2015-11-26 – 2015-11-27 (×2): 4 mg via INTRAVENOUS
  Filled 2015-11-25 (×2): qty 2

## 2015-11-25 MED ORDER — PANTOPRAZOLE SODIUM 40 MG PO TBEC
40.0000 mg | DELAYED_RELEASE_TABLET | Freq: Every day | ORAL | Status: DC
  Administered 2015-11-27 – 2015-11-29 (×3): 40 mg via ORAL
  Filled 2015-11-25 (×3): qty 1

## 2015-11-25 MED ORDER — HEPARIN SODIUM (PORCINE) 1000 UNIT/ML IJ SOLN
INTRAMUSCULAR | Status: DC | PRN
  Administered 2015-11-25: 33000 [IU] via INTRAVENOUS

## 2015-11-25 MED ORDER — SODIUM CHLORIDE 0.9 % IV SOLN
INTRAVENOUS | Status: DC
  Administered 2015-11-27: 10 mL/h via INTRAVENOUS

## 2015-11-25 MED ORDER — MAGNESIUM SULFATE 4 GM/100ML IV SOLN
4.0000 g | Freq: Once | INTRAVENOUS | Status: AC
  Administered 2015-11-25: 4 g via INTRAVENOUS
  Filled 2015-11-25: qty 100

## 2015-11-25 MED ORDER — SODIUM CHLORIDE 0.9 % IV SOLN
0.5000 g/h | Freq: Once | INTRAVENOUS | Status: DC
  Filled 2015-11-25: qty 20

## 2015-11-25 MED ORDER — POTASSIUM CHLORIDE 10 MEQ/50ML IV SOLN
10.0000 meq | INTRAVENOUS | Status: AC
  Administered 2015-11-25 (×3): 10 meq via INTRAVENOUS

## 2015-11-25 MED ORDER — ASPIRIN 81 MG PO CHEW
324.0000 mg | CHEWABLE_TABLET | Freq: Every day | ORAL | Status: DC
  Filled 2015-11-25: qty 4

## 2015-11-25 MED ORDER — SODIUM CHLORIDE 0.9% FLUSH
3.0000 mL | Freq: Two times a day (BID) | INTRAVENOUS | Status: DC
  Administered 2015-11-26 – 2015-11-29 (×7): 3 mL via INTRAVENOUS

## 2015-11-25 MED ORDER — PHENYLEPHRINE HCL 10 MG/ML IJ SOLN
INTRAMUSCULAR | Status: DC | PRN
  Administered 2015-11-25: 40 ug via INTRAVENOUS
  Administered 2015-11-25 (×3): 80 ug via INTRAVENOUS

## 2015-11-25 MED ORDER — SODIUM CHLORIDE 0.9 % IV SOLN
INTRAVENOUS | Status: AC
  Administered 2015-11-25: 1.7 [IU]/h via INTRAVENOUS
  Filled 2015-11-25: qty 2.5

## 2015-11-25 MED ORDER — PROTAMINE SULFATE 10 MG/ML IV SOLN
INTRAVENOUS | Status: DC | PRN
  Administered 2015-11-25: 330 mg via INTRAVENOUS

## 2015-11-25 MED ORDER — DOPAMINE-DEXTROSE 3.2-5 MG/ML-% IV SOLN: 0.0000 ug/kg/min | INTRAVENOUS | Status: DC

## 2015-11-25 MED ORDER — ROCURONIUM BROMIDE 50 MG/5ML IV SOLN
INTRAVENOUS | Status: AC
Start: 2015-11-25 — End: 2015-11-25
  Filled 2015-11-25: qty 2

## 2015-11-25 MED ORDER — HEMOSTATIC AGENTS (NO CHARGE) OPTIME
TOPICAL | Status: DC | PRN
  Administered 2015-11-25 (×5): 1 via TOPICAL

## 2015-11-25 MED ORDER — INSULIN REGULAR HUMAN 100 UNIT/ML IJ SOLN
INTRAMUSCULAR | Status: DC
  Filled 2015-11-25: qty 2.5

## 2015-11-25 MED ORDER — THROMBIN 20000 UNITS EX SOLR
OROMUCOSAL | Status: DC | PRN
  Administered 2015-11-25 (×3): 4 mL via TOPICAL

## 2015-11-25 MED ORDER — PHENYLEPHRINE HCL 10 MG/ML IJ SOLN
30.0000 ug/min | INTRAVENOUS | Status: AC
  Administered 2015-11-25: 20 ug/min via INTRAVENOUS
  Filled 2015-11-25: qty 2

## 2015-11-25 SURGICAL SUPPLY — 116 items
ADAPTER CARDIO PERF ANTE/RETRO (ADAPTER) ×4 IMPLANT
ADPR PRFSN 84XANTGRD RTRGD (ADAPTER) ×2
BAG DECANTER FOR FLEXI CONT (MISCELLANEOUS) ×4 IMPLANT
BANDAGE ELASTIC 4 VELCRO ST LF (GAUZE/BANDAGES/DRESSINGS) ×4 IMPLANT
BANDAGE ELASTIC 6 VELCRO ST LF (GAUZE/BANDAGES/DRESSINGS) ×4 IMPLANT
BASKET HEART  (ORDER IN 25'S) (MISCELLANEOUS) ×1
BASKET HEART (ORDER IN 25'S) (MISCELLANEOUS) ×1
BASKET HEART (ORDER IN 25S) (MISCELLANEOUS) ×2 IMPLANT
BLADE STERNUM SYSTEM 6 (BLADE) ×4 IMPLANT
BLADE SURG 15 STRL LF DISP TIS (BLADE) ×2 IMPLANT
BLADE SURG 15 STRL SS (BLADE) ×4
BNDG GAUZE ELAST 4 BULKY (GAUZE/BANDAGES/DRESSINGS) ×4 IMPLANT
CANISTER SUCTION 2500CC (MISCELLANEOUS) ×4 IMPLANT
CANNULA GUNDRY RCSP 15FR (MISCELLANEOUS) ×4 IMPLANT
CATH HEART VENT LEFT (CATHETERS) IMPLANT
CATH ROBINSON RED A/P 18FR (CATHETERS) ×12 IMPLANT
CATH THORACIC 28FR (CATHETERS) ×4 IMPLANT
CATH THORACIC 36FR (CATHETERS) ×4 IMPLANT
CATH THORACIC 36FR RT ANG (CATHETERS) ×4 IMPLANT
CLIP TI MEDIUM 24 (CLIP) IMPLANT
CLIP TI WIDE RED SMALL 24 (CLIP) ×2 IMPLANT
CONT SPEC STER OR (MISCELLANEOUS) ×4 IMPLANT
CONT SPECI 4OZ STER CLIK (MISCELLANEOUS) ×2 IMPLANT
COVER SURGICAL LIGHT HANDLE (MISCELLANEOUS) ×4 IMPLANT
CRADLE DONUT ADULT HEAD (MISCELLANEOUS) ×4 IMPLANT
DRAPE CARDIOVASCULAR INCISE (DRAPES) ×4
DRAPE SLUSH/WARMER DISC (DRAPES) ×4 IMPLANT
DRAPE SRG 135X102X78XABS (DRAPES) ×2 IMPLANT
DRSG COVADERM 4X14 (GAUZE/BANDAGES/DRESSINGS) ×4 IMPLANT
ELECT CAUTERY BLADE 6.4 (BLADE) ×4 IMPLANT
ELECT REM PT RETURN 9FT ADLT (ELECTROSURGICAL) ×8
ELECTRODE REM PT RTRN 9FT ADLT (ELECTROSURGICAL) ×4 IMPLANT
FELT TEFLON 1X6 (MISCELLANEOUS) ×4 IMPLANT
GAUZE SPONGE 4X4 12PLY STRL (GAUZE/BANDAGES/DRESSINGS) ×8 IMPLANT
GLOVE BIO SURGEON STRL SZ 6 (GLOVE) IMPLANT
GLOVE BIO SURGEON STRL SZ 6.5 (GLOVE) IMPLANT
GLOVE BIO SURGEON STRL SZ7 (GLOVE) IMPLANT
GLOVE BIO SURGEON STRL SZ7.5 (GLOVE) IMPLANT
GLOVE BIO SURGEONS STRL SZ 6.5 (GLOVE)
GLOVE BIOGEL PI IND STRL 6 (GLOVE) IMPLANT
GLOVE BIOGEL PI IND STRL 6.5 (GLOVE) IMPLANT
GLOVE BIOGEL PI IND STRL 7.0 (GLOVE) IMPLANT
GLOVE BIOGEL PI INDICATOR 6 (GLOVE)
GLOVE BIOGEL PI INDICATOR 6.5 (GLOVE)
GLOVE BIOGEL PI INDICATOR 7.0 (GLOVE)
GLOVE EUDERMIC 7 POWDERFREE (GLOVE) ×12 IMPLANT
GLOVE ORTHO TXT STRL SZ7.5 (GLOVE) IMPLANT
GOWN STRL REUS W/ TWL LRG LVL3 (GOWN DISPOSABLE) ×8 IMPLANT
GOWN STRL REUS W/ TWL XL LVL3 (GOWN DISPOSABLE) ×2 IMPLANT
GOWN STRL REUS W/TWL LRG LVL3 (GOWN DISPOSABLE) ×16
GOWN STRL REUS W/TWL XL LVL3 (GOWN DISPOSABLE) ×4
HEART VENT LT CURVED (MISCELLANEOUS) ×4 IMPLANT
HEMOSTAT POWDER SURGIFOAM 1G (HEMOSTASIS) ×12 IMPLANT
HEMOSTAT SURGICEL 2X14 (HEMOSTASIS) ×10 IMPLANT
INSERT FOGARTY 61MM (MISCELLANEOUS) IMPLANT
INSERT FOGARTY XLG (MISCELLANEOUS) ×2 IMPLANT
KIT BASIN OR (CUSTOM PROCEDURE TRAY) ×4 IMPLANT
KIT CATH CPB BARTLE (MISCELLANEOUS) ×4 IMPLANT
KIT ROOM TURNOVER OR (KITS) ×4 IMPLANT
KIT SUCTION CATH 14FR (SUCTIONS) ×4 IMPLANT
KIT VASOVIEW 6 PRO VH 2400 (KITS) ×4 IMPLANT
LINE VENT (MISCELLANEOUS) ×2 IMPLANT
NS IRRIG 1000ML POUR BTL (IV SOLUTION) ×24 IMPLANT
PACK OPEN HEART (CUSTOM PROCEDURE TRAY) ×4 IMPLANT
PAD ARMBOARD 7.5X6 YLW CONV (MISCELLANEOUS) ×8 IMPLANT
PAD ELECT DEFIB RADIOL ZOLL (MISCELLANEOUS) ×4 IMPLANT
PENCIL BUTTON HOLSTER BLD 10FT (ELECTRODE) ×4 IMPLANT
PUNCH AORTIC ROT 4.0MM RCL 40 (MISCELLANEOUS) ×2 IMPLANT
PUNCH AORTIC ROTATE 4.0MM (MISCELLANEOUS) IMPLANT
PUNCH AORTIC ROTATE 4.5MM 8IN (MISCELLANEOUS) ×4 IMPLANT
PUNCH AORTIC ROTATE 5MM 8IN (MISCELLANEOUS) IMPLANT
SET CARDIOPLEGIA MPS 5001102 (MISCELLANEOUS) ×2 IMPLANT
SPONGE GAUZE 4X4 12PLY STER LF (GAUZE/BANDAGES/DRESSINGS) ×2 IMPLANT
SPONGE INTESTINAL PEANUT (DISPOSABLE) IMPLANT
SPONGE LAP 18X18 X RAY DECT (DISPOSABLE) ×6 IMPLANT
SPONGE LAP 4X18 X RAY DECT (DISPOSABLE) ×4 IMPLANT
SURGIFLO W/THROMBIN 8M KIT (HEMOSTASIS) ×2 IMPLANT
SUT BONE WAX W31G (SUTURE) ×4 IMPLANT
SUT ETHIBON 2 0 V 52N 30 (SUTURE) ×8 IMPLANT
SUT MNCRL AB 4-0 PS2 18 (SUTURE) IMPLANT
SUT PROLENE 3 0 SH DA (SUTURE) IMPLANT
SUT PROLENE 3 0 SH1 36 (SUTURE) ×4 IMPLANT
SUT PROLENE 4 0 RB 1 (SUTURE) ×20
SUT PROLENE 4 0 SH DA (SUTURE) IMPLANT
SUT PROLENE 4-0 RB1 .5 CRCL 36 (SUTURE) ×6 IMPLANT
SUT PROLENE 5 0 C 1 36 (SUTURE) ×2 IMPLANT
SUT PROLENE 6 0 C 1 30 (SUTURE) ×16 IMPLANT
SUT PROLENE 7 0 BV 1 (SUTURE) IMPLANT
SUT PROLENE 7 0 BV1 MDA (SUTURE) ×6 IMPLANT
SUT PROLENE 8 0 BV175 6 (SUTURE) IMPLANT
SUT SILK  1 MH (SUTURE) ×2
SUT SILK 1 MH (SUTURE) IMPLANT
SUT STEEL 6MS V (SUTURE) IMPLANT
SUT STEEL STERNAL CCS#1 18IN (SUTURE) IMPLANT
SUT STEEL SZ 6 DBL 3X14 BALL (SUTURE) IMPLANT
SUT VIC AB 1 CTX 36 (SUTURE) ×8
SUT VIC AB 1 CTX36XBRD ANBCTR (SUTURE) ×4 IMPLANT
SUT VIC AB 2-0 CT1 27 (SUTURE) ×4
SUT VIC AB 2-0 CT1 TAPERPNT 27 (SUTURE) IMPLANT
SUT VIC AB 2-0 CTX 27 (SUTURE) IMPLANT
SUT VIC AB 3-0 SH 27 (SUTURE)
SUT VIC AB 3-0 SH 27X BRD (SUTURE) IMPLANT
SUT VIC AB 3-0 X1 27 (SUTURE) ×2 IMPLANT
SUT VICRYL 4-0 PS2 18IN ABS (SUTURE) IMPLANT
SUTURE E-PAK OPEN HEART (SUTURE) ×4 IMPLANT
SYSTEM SAHARA CHEST DRAIN ATS (WOUND CARE) ×4 IMPLANT
TAPE CLOTH SURG 4X10 WHT LF (GAUZE/BANDAGES/DRESSINGS) ×2 IMPLANT
TAPE PAPER 3X10 WHT MICROPORE (GAUZE/BANDAGES/DRESSINGS) ×2 IMPLANT
TOWEL OR 17X24 6PK STRL BLUE (TOWEL DISPOSABLE) ×8 IMPLANT
TOWEL OR 17X26 10 PK STRL BLUE (TOWEL DISPOSABLE) ×8 IMPLANT
TRAY FOLEY IC TEMP SENS 16FR (CATHETERS) ×4 IMPLANT
TUBING INSUFFLATION (TUBING) ×4 IMPLANT
UNDERPAD 30X30 INCONTINENT (UNDERPADS AND DIAPERS) ×4 IMPLANT
VALVE MAGNA EASE AORTIC 23MM (Prosthesis & Implant Heart) ×2 IMPLANT
VENT LEFT HEART 12002 (CATHETERS) ×4
WATER STERILE IRR 1000ML POUR (IV SOLUTION) ×8 IMPLANT

## 2015-11-25 NOTE — Brief Op Note (Signed)
11/15/2015 - 11/25/2015  12:49 PM      [address]       [place] [address]             [phone]     11/15/2015 - 11/25/2015  12:50 PM  PATIENT:  [name]  55 y.o. male  PRE-OPERATIVE DIAGNOSIS:  CAD SEVERE AI  POST-OPERATIVE DIAGNOSIS:  CAD SEVERE AI  PROCEDURE:  Procedure(s): CORONARY ARTERY BYPASS GRAFTING (CABG)x4 LIMA-LAD; SVG-OM; SVG-RCA; SVG-DIAG AORTIC VALVE REPLACEMENT (AVR) #23 MAGNA-EASE TRANSESOPHAGEAL ECHOCARDIOGRAM (TEE)  SURGEON:  Surgeon(s): Alleen Borne, MD  PHYSICIAN ASSISTANT: WAYNE GOLD PA-C  ANESTHESIA:   general  PATIENT CONDITION:  ICU - intubated and hemodynamically stable.  PRE-OPERATIVE WEIGHT: 94kg  Aortic Valve  Procedure Performed:  Replacement: Yes.  Bioprosthetic Valve. Implant Model Number:[id number], Size:23, Unique Device Identifier:[id number]  Repair/Reconstruction: No.   Aortic Annular Enlargement: No.   Aortic Valve Etiology   Aortic Insufficiency:  Severe  Aortic Valve Disease:  No.  Aortic Stenosis:  No.  Etiology (Choose at least one and up to  5 etiologies):  Bicuspid valve disease

## 2015-11-25 NOTE — Procedures (Addendum)
Extubation Procedure Note  Patient Details:   Name: [name] DOB: November 15, 1960 MRN: [id number]   Airway Documentation:  Airway (Active)  Secured at (cm) 22 cm 11/25/2015  8:38 PM  Measured From Lips 11/25/2015  8:38 PM  Secured Location Right 11/25/2015  8:38 PM  Secured By [employer] Tape 11/25/2015  8:38 PM  Cuff Pressure (cm H2O) 26 cm H2O 11/25/2015  8:38 PM  Site Condition Cool;Dry 11/25/2015  8:38 PM    Evaluation  O2 sats: stable throughout Complications: No apparent complications Patient did tolerate procedure well. Bilateral Breath Sounds: Clear, Diminished Suctioning: Airway Yes, pt able to hoarsely vocalize name. Pt also had cuff leak before extubation. Pt Nif consistently 25 to 26 and a VC of 4.5L.  Pt able to cough to clear secretions. Placed on 5L humidified nasal cannula.   Tacy Learn 11/25/2015, 10:53 PM

## 2015-11-25 NOTE — Anesthesia Preprocedure Evaluation (Addendum)
Anesthesia Evaluation  Patient identified by MRN, date of birth, ID band  Airway Mallampati: II  TM Distance: >3 FB Neck ROM: Full    Dental  (+) Edentulous Upper   Pulmonary    breath sounds clear to auscultation       Cardiovascular hypertension, + CAD and +CHF   Rhythm:Regular Rate:Normal     Neuro/Psych    GI/Hepatic GERD  ,  Endo/Other  diabetes  Renal/GU Renal disease     Musculoskeletal  (+) Arthritis ,   Abdominal (+) + obese,   Peds  Hematology   Anesthesia Other Findings   Reproductive/Obstetrics                            Anesthesia Physical Anesthesia Plan  ASA: IV  Anesthesia Plan: General   Post-op Pain Management:    Induction: Intravenous  Airway Management Planned: Oral ETT  Additional Equipment: Arterial line, PA Cath and TEE  Intra-op Plan:   Post-operative Plan: Post-operative intubation/ventilation  Informed Consent: I have reviewed the patients History and Physical, chart, labs and discussed the procedure including the risks, benefits and alternatives for the proposed anesthesia with the patient or authorized representative who has indicated his/her understanding and acceptance.   Dental advisory given  Plan Discussed with: CRNA and Surgeon  Anesthesia Plan Comments:        Anesthesia Quick Evaluation

## 2015-11-25 NOTE — OR Nursing (Signed)
1st call made to SICU Joni Reining) @1400 , confirming plans for transfer to Room 12 postoperatively. 2nd call made to SICU Joni Reining) @1413 , providing a patient update. Patient transferred to Room 12 on 2S.

## 2015-11-25 NOTE — Progress Notes (Signed)
[facility] BRIEF SICU PROGRESS NOTE  Day of Surgery  S/P Procedure(s) (LRB): CORONARY ARTERY BYPASS GRAFTING (CABG)x4 LIMA-LAD; SVG-OM; SVG-RCA; SVG-DIAG (N/A) AORTIC VALVE REPLACEMENT (AVR) USING A MAGNA EASE  (N/A) TRANSESOPHAGEAL ECHOCARDIOGRAM (TEE) (N/A)   Sedated on vent NSR w/ stable hemodynamics on low dose milrinone and Neo drips O2 sats 100% Chest tube output low UOP adequate Labs okay w/ Hgb 8.0  Plan: Continue routine early postop  Purcell Nails, MD 11/25/2015 7:20 PM

## 2015-11-25 NOTE — Anesthesia Procedure Notes (Signed)
Procedure Name: Intubation Date/Time: 11/25/2015 7:54 AM Performed by: Coralee Rud Pre-anesthesia Checklist: Patient identified, Emergency Drugs available, Suction available and Patient being monitored Patient Re-evaluated:Patient Re-evaluated prior to inductionOxygen Delivery Method: Circle System Utilized Preoxygenation: Pre-oxygenation with 100% oxygen Intubation Type: IV induction Ventilation: Mask ventilation without difficulty and Oral airway inserted - appropriate to patient size Laryngoscope Size: Miller and 2 Grade View: Grade I Tube type: Oral Number of attempts: 1 Airway Equipment and Method: Stylet and Oral airway Placement Confirmation: ETT inserted through vocal cords under direct vision,  positive ETCO2 and breath sounds checked- equal and bilateral Secured at: 22 cm Tube secured with: Tape Dental Injury: Teeth and Oropharynx as per pre-operative assessment

## 2015-11-25 NOTE — Op Note (Signed)
CARDIOVASCULAR SURGERY OPERATIVE NOTE  11/25/2015  Surgeon:  Alleen Borne, MD  First Assistant: Gershon Crane,  PA-C   Preoperative Diagnosis:  Severe multi-vessel coronary artery disease and bicuspid aortic valve with severe aortic insufficiency   Postoperative Diagnosis:  Same   Procedure:  1. Median Sternotomy 2. Extracorporeal circulation 3.   Coronary artery bypass grafting x 4   Left internal mammary graft to the LAD  SVG to diagonal  SVG to Om  SVG to RCA 4.   Endoscopic vein harvest from the right leg 5.   Aortic valve replacement using a 23 mm [employer] Magna-Ease pericardial valve   Anesthesia:  General Endotracheal   Clinical History/Surgical Indication:  The patient is a 55 year old gentleman with a history of hypertension, hypercholesterolemia, type 2 DM, heart murmur and gout who does not see a PCP regularly. He was seen in the ER on 3/8 with what was felt to be an acute gout flare involving the left knee and lower leg. He was treated with prednisone and Indocin. He reports that after starting these drugs he developed severe abdominal pain and vomiting, shortness of breath and chest pain. He stopped the medications but continued to have abdominal and chest pain with shortness of breath. He returned to the ER on 3/11 and a CXR showed some vascular congestion. Troponin was negative. Creat was elevated to 1.8 from his normal of 1.0. He was admitted and hydrated. He had an echo on 3/13 that showed a bicuspid aortic valve with mildly thickened and calcified leaflets with severe AI. LV function was normal. Compared to an echo in 03/2015 his AI had progressed. It was moderate to severe in July. LV internal dimensions are about the same as they were in July and within normal limits. He underwent cardiac cath on 3/15 showing 50% LM and severe multivessel coronary disease with severe  pulmonary HTN at 103/53. LVEDP was 46 mm Hg. He underwent TEE on 11/20/2015 showing a mildly dilated LV with an EF of 60% with a 10 mm Hgb gradient across a bicuspid AV with holodiastolic flow reversal in the ascending aorta. There was a small PFO by color doppler with crossing of a couple bubbles. The aortic root was 3.8 cm with a 3 cm ascending aorta. A CTA in 01/2009 showed a normal caliber thoracic aorta.  He has a bicuspid aortic valve with severe AI and severe multi-vessel coronary disease presenting with acute on chronic diastolic heart failure after starting a course of prednisone and Indocin for a gout flare. He had moderate to severe AI last July and it has progressed. He denies any symptoms until taking the above meds but he is sedentary and unemployed. His LV function is still normal and the LV dimensions have been fairly stable and still within normal limits. He does have severe pulmonary hypertension and a markedly elevated LVEDP at cath. He looks clinically good right now which is surprising given these numbers with severe AI. His aortic root is minimally dilated. He will require AVR and CABG, possible closure of a small PFO. He has been seen by Dr. Kristin Bruins and is going to have some dental extractions tomorrow. I will plan to do surgery Tuesday next week which will allow time for him to get over the extractions and diurese some. I discussed the operative procedure with the patient including alternatives, benefits and risks; including but not limited to bleeding, blood transfusion, infection, stroke, myocardial infarction, graft failure, heart block requiring a permanent pacemaker,  organ dysfunction, and death. [name] understands and agrees to proceed.     Preparation:  The patient was seen in the preoperative holding area and the correct patient, correct operation were confirmed with the patient after reviewing the medical record and catheterization. The consent was signed by me.  Preoperative antibiotics were given. A pulmonary arterial line and radial arterial line were placed by the anesthesia team. The patient was taken back to the operating room and positioned supine on the operating room table. After being placed under general endotracheal anesthesia by the anesthesia team a foley catheter was placed. The neck, chest, abdomen, and both legs were prepped with betadine soap and solution and draped in the usual sterile manner. A surgical time-out was taken and the correct patient and operative procedure were confirmed with the nursing and anesthesia staff.  TEE:  Performed by Dr. Sharee Holster  This showed mild LVH with normal LV systolic function. The aortic valve was bicuspid with severe AI. Trivial MR. No PFO seen.   Cardiopulmonary Bypass:  A median sternotomy was performed. The pericardium was opened in the midline. Right ventricular function appeared normal. The ascending aorta was of normal size and had no palpable plaque. There were no contraindications to aortic cannulation or cross-clamping. The patient was fully systemically heparinized and the ACT was maintained > 400 sec. The proximal aortic arch was cannulated with a 20 F aortic cannula for arterial inflow. Venous cannulation was performed via the right atrial appendage using a two-staged venous cannula. An antegrade cardioplegia/vent cannula was inserted into the mid-ascending aorta. A left ventricular vent was placed through the right superior pulmonary vein. A retrograde cardioplegia cannula was placed via the right atrium into the coronary sinus. Aortic occlusion was performed with a single cross-clamp. Systemic cooling to 32 degrees Centigrade and topical cooling of the heart with iced saline were used. Hyperkalemic retrograde cold blood cardioplegia was used to induce diastolic arrest since there was severe AI and was then given at about 20 minute intervals throughout the period of arrest to maintain  myocardial temperature at or below 10 degrees centigrade. A temperature probe was inserted into the interventricular septum and an insulating pad was placed in the pericardium.   Left internal mammary harvest:  The left side of the sternum was retracted using the Rultract retractor. The left internal mammary artery was harvested as a pedicle graft. All side branches were clipped. It was a medium-sized vessel of good quality with excellent blood flow. It was ligated distally and divided. It was sprayed with topical papaverine solution to prevent vasospasm.   Endoscopic vein harvest:  The right greater saphenous vein was harvested endoscopically through a 2 cm incision medial to the right knee. It was harvested from the upper thigh to below the knee. It was a medium-sized vein of good quality. The side branches were all ligated with 4-0 silk ties.    Coronary arteries:  The coronary arteries were examined.   LAD:  Large vessel with no distal disease. The diagonal had two sub-branches. The lateral branch was larger and had 80% proximal stenosis on cath corresponding to calcified plaque seen in the artery. The medial branch was smaller with no disease.  LCX:  Large OM with no distal disease  RCA:  The RCA had mild disease but soft distally before the PDA branch. The PDA and PL were relatively small with no distal disease.   Grafts:  1. LIMA to the LAD: 2.5 mm. It was sewn end  to side using 8-0 prolene continuous suture. 2. SVG to diagonal:  1.75 mm. It was sewn end to side using 7-0 prolene continuous suture. 3. SVG to OM:  1.75 mm. It was sewn end to side using 7-0 prolene continuous suture. 4. SVG to RCA:  3.0 mm. It was sewn end to side using 7-0 prolene continuous suture.  The proximal vein graft anastomoses were performed to the mid-ascending aorta using continuous 6-0 prolene suture. Graft markers were placed around the proximal anastomoses.   Aortic Valve Replacement:  A  transverse aortotomy was performed 1 cm above the take-off of the right coronary artery. The native valve was bicuspid with three commissures. The right and non-coronary leaflets were fused with thickening of the leaflets and mild calcification.The ostia of the coronary arteries were in normal position and were not obstructed. The native valve leaflets were excised and the annulus was decalcified with rongeurs. Care was taken to remove all particulate debris. The left ventricle was directly inspected for debris and then irrigated with ice saline solution. The annulus was sized and a size 23 mm [employer] Ease pericardial valve was chosen. The model number was [id number] and the serial number was [id number]. BSA was 1.99 and I felt that this valve would be sufficient.  While the valve was being prepared 2-0 Ethibond pledgeted horizontal mattress sutures were placed around the annulus with the pledgets in a sub-annular position. The sutures were placed through the sewing ring and the valve lowered into place. The sutures were tied sequentially. The valve seated nicely and the coronary ostia were not obstructed. The prosthetic valve leaflets moved normally and there was no sub-valvular obstruction. The aortotomy was closed using 4-0 Prolene suture in 2 layers with felt strips to reinforce the closure.  Completion:  The patient was rewarmed to 37 degrees Centigrade. The clamp was removed from the LIMA pedicle and there was rapid warming of the septum and return of ventricular fibrillation. The crossclamp was removed with a time of 182 minutes. There was spontaneous return of sinus rhythm. The distal and proximal anastomoses were checked for hemostasis. The position of the grafts was satisfactory. Two temporary epicardial pacing wires were placed on the right atrium and two on the right ventricle. The patient was weaned from CPB without difficulty on Milrinone 0.25 mcg and dopamine 3 mcg. CPB time was 220 minutes.  Cardiac output was 6 LPM. TEE showed a normally functioning aortic valve prosthesis with no paravalvular leak or central regurgitation. LV funciton was preserved. Heparin was fully reversed with protamine and the aortic and venous cannulas removed. The patient was markedly coagulopathic and bleeding from all of the needle holes in the proximal vein graft anastomoses as well as from the sternum and soft tissues of the mediastinum. He was given platelets and FFP. Hemostasis was achieved with some difficulty. Mediastinal and left pleural drainage tubes were placed. The sternum was closed with double #6 stainless steel wires. The fascia was closed with continuous # 1 vicryl suture. The subcutaneous tissue was closed with 2-0 vicryl continuous suture. The skin was closed with 3-0 vicryl subcuticular suture. All sponge, needle, and instrument counts were reported correct at the end of the case. Dry sterile dressings were placed over the incisions and around the chest tubes which were connected to pleurevac suction. The patient was then transported to the surgical intensive care unit in critical but stable condition.

## 2015-11-25 NOTE — Progress Notes (Signed)
Settings changed per Rapid Wean Protocol  

## 2015-11-25 NOTE — Progress Notes (Signed)
Patient watched preparing for heart surgery video. Was given IS, demonstrated use with teach back. 2 CHG baths given, patient clipped and prepped with sacral foam in place. IS and toiletries will be sent to 2S. Patient valuables held by wife during surgery.

## 2015-11-25 NOTE — Anesthesia Postprocedure Evaluation (Signed)
Anesthesia Post Note  Patient: [name]  Procedure(s) Performed: Procedure(s) (LRB): Extraction of tooth #'s 2,12 with alveoloplasty and gross debridement of remaining dentition (Bilateral)  Patient location during evaluation: PACU Anesthesia Type: General Level of consciousness: awake and alert and patient cooperative Pain management: pain level controlled Vital Signs Assessment: post-procedure vital signs reviewed and stable Respiratory status: spontaneous breathing and respiratory function stable Cardiovascular status: stable Anesthetic complications: no    Last Vitals:  Filed Vitals:   11/25/15 0500 11/25/15 0542  BP:  166/93  Pulse:    Temp: 36.8 C   Resp:      Last Pain:  Filed Vitals:   11/25/15 0611  PainSc: Asleep                 Brytney Somes S

## 2015-11-25 NOTE — OR Nursing (Signed)
3rd call made to SICU, 20 minute ETA

## 2015-11-25 NOTE — Progress Notes (Signed)
Echocardiogram Echocardiogram Transesophageal has been performed.  [name] 11/25/2015, 8:35 AM

## 2015-11-25 NOTE — Anesthesia Postprocedure Evaluation (Signed)
Anesthesia Post Note  Patient: [name]  Procedure(s) Performed: Procedure(s) (LRB): CORONARY ARTERY BYPASS GRAFTING (CABG)x4 LIMA-LAD; SVG-OM; SVG-RCA; SVG-DIAG (N/A) AORTIC VALVE REPLACEMENT (AVR) USING A MAGNA EASE  (N/A) TRANSESOPHAGEAL ECHOCARDIOGRAM (TEE) (N/A)  Patient location during evaluation: SICU Anesthesia Type: General Level of consciousness: sedated Pain management: pain level controlled Vital Signs Assessment: post-procedure vital signs reviewed and stable Respiratory status: patient remains intubated per anesthesia plan Cardiovascular status: stable Anesthetic complications: no    Last Vitals:  Filed Vitals:   11/25/15 0500 11/25/15 0542  BP:  166/93  Pulse:    Temp: 36.8 C   Resp:      Last Pain:  Filed Vitals:   11/25/15 1730  PainSc: Asleep                 Elwyn Klosinski,W. EDMOND

## 2015-11-25 NOTE — Transfer of Care (Signed)
Immediate Anesthesia Transfer of Care Note  Patient: [name]  Procedure(s) Performed: Procedure(s): CORONARY ARTERY BYPASS GRAFTING (CABG)x4 LIMA-LAD; SVG-OM; SVG-RCA; SVG-DIAG (N/A) AORTIC VALVE REPLACEMENT (AVR) USING A MAGNA EASE  (N/A) TRANSESOPHAGEAL ECHOCARDIOGRAM (TEE) (N/A)  Patient Location: SICU  Anesthesia Type:General  Level of Consciousness: Patient remains intubated per anesthesia plan  Airway & Oxygen Therapy: Patient remains intubated per anesthesia plan and Patient placed on Ventilator (see vital sign flow sheet for setting)  Post-op Assessment: Report given to RN and Post -op Vital signs reviewed and stable  Post vital signs: Reviewed and stable  Last Vitals:  Filed Vitals:   11/25/15 0500 11/25/15 0542  BP:  166/93  Pulse:    Temp: 36.8 C   Resp:      Complications: No apparent anesthesia complications

## 2015-11-26 ENCOUNTER — Encounter (HOSPITAL_COMMUNITY): Payer: Self-pay | Admitting: Surgery

## 2015-11-26 ENCOUNTER — Inpatient Hospital Stay (HOSPITAL_COMMUNITY): Payer: Commercial Managed Care - HMO

## 2015-11-26 DIAGNOSIS — I351 Nonrheumatic aortic (valve) insufficiency: Secondary | ICD-10-CM

## 2015-11-26 LAB — GLUCOSE, CAPILLARY
GLUCOSE-CAPILLARY: 106 mg/dL — AB (ref 65–99)
GLUCOSE-CAPILLARY: 103 mg/dL — AB (ref 65–99)
GLUCOSE-CAPILLARY: 113 mg/dL — AB (ref 65–99)
GLUCOSE-CAPILLARY: 118 mg/dL — AB (ref 65–99)
GLUCOSE-CAPILLARY: 183 mg/dL — AB (ref 65–99)
GLUCOSE-CAPILLARY: 165 mg/dL — AB (ref 65–99)
GLUCOSE-CAPILLARY: 230 mg/dL — AB (ref 65–99)
Glucose-Capillary: 110 mg/dL — ABNORMAL HIGH (ref 65–99)
Glucose-Capillary: 110 mg/dL — ABNORMAL HIGH (ref 65–99)
Glucose-Capillary: 115 mg/dL — ABNORMAL HIGH (ref 65–99)
Glucose-Capillary: 125 mg/dL — ABNORMAL HIGH (ref 65–99)
Glucose-Capillary: 134 mg/dL — ABNORMAL HIGH (ref 65–99)
Glucose-Capillary: 144 mg/dL — ABNORMAL HIGH (ref 65–99)
Glucose-Capillary: 146 mg/dL — ABNORMAL HIGH (ref 65–99)
Glucose-Capillary: 147 mg/dL — ABNORMAL HIGH (ref 65–99)
Glucose-Capillary: 149 mg/dL — ABNORMAL HIGH (ref 65–99)
Glucose-Capillary: 72 mg/dL (ref 65–99)

## 2015-11-26 LAB — CBC
HCT: 25 % — ABNORMAL LOW (ref 39.0–52.0)
HEMATOCRIT: 25.3 % — AB (ref 39.0–52.0)
HEMATOCRIT: 26.6 % — AB (ref 39.0–52.0)
HEMOGLOBIN: 8.1 g/dL — AB (ref 13.0–17.0)
HEMOGLOBIN: 8.4 g/dL — AB (ref 13.0–17.0)
HEMOGLOBIN: 8.5 g/dL — AB (ref 13.0–17.0)
MCH: 28.7 pg (ref 26.0–34.0)
MCH: 29.2 pg (ref 26.0–34.0)
MCH: 30 pg (ref 26.0–34.0)
MCHC: 32 g/dL (ref 30.0–36.0)
MCHC: 32.4 g/dL (ref 30.0–36.0)
MCHC: 33.2 g/dL (ref 30.0–36.0)
MCV: 89.9 fL (ref 78.0–100.0)
MCV: 90.3 fL (ref 78.0–100.0)
MCV: 90.4 fL (ref 78.0–100.0)
Platelets: 179 10*3/uL (ref 150–400)
Platelets: 180 10*3/uL (ref 150–400)
Platelets: 209 10*3/uL (ref 150–400)
RBC: 2.77 MIL/uL — AB (ref 4.22–5.81)
RBC: 2.8 MIL/uL — AB (ref 4.22–5.81)
RBC: 2.96 MIL/uL — ABNORMAL LOW (ref 4.22–5.81)
RDW: 14.4 % (ref 11.5–15.5)
RDW: 14.5 % (ref 11.5–15.5)
RDW: 14.6 % (ref 11.5–15.5)
WBC: 12.5 10*3/uL — ABNORMAL HIGH (ref 4.0–10.5)
WBC: 12.7 10*3/uL — AB (ref 4.0–10.5)
WBC: 14.4 10*3/uL — AB (ref 4.0–10.5)

## 2015-11-26 LAB — POCT I-STAT 3, ART BLOOD GAS (G3+)
Acid-Base Excess: 1 mmol/L (ref 0.0–2.0)
BICARBONATE: 25.4 meq/L — AB (ref 20.0–24.0)
BICARBONATE: 26.5 meq/L — AB (ref 20.0–24.0)
O2 SAT: 92 %
O2 Saturation: 96 %
PCO2 ART: 45.2 mmHg — AB (ref 35.0–45.0)
PH ART: 7.378 (ref 7.350–7.450)
PO2 ART: 65 mmHg — AB (ref 80.0–100.0)
PO2 ART: 88 mmHg (ref 80.0–100.0)
Patient temperature: 37.4
TCO2: 27 mmol/L (ref 0–100)
TCO2: 28 mmol/L (ref 0–100)
pCO2 arterial: 43.8 mmHg (ref 35.0–45.0)
pH, Arterial: 7.37 (ref 7.350–7.450)

## 2015-11-26 LAB — PREPARE PLATELET PHERESIS
UNIT DIVISION: 0
Unit division: 0

## 2015-11-26 LAB — PREPARE FRESH FROZEN PLASMA
UNIT DIVISION: 0
UNIT DIVISION: 0
UNIT DIVISION: 0
Unit division: 0

## 2015-11-26 LAB — POCT I-STAT, CHEM 8
BUN: 12 mg/dL (ref 6–20)
BUN: 17 mg/dL (ref 6–20)
CALCIUM ION: 1.17 mmol/L (ref 1.12–1.23)
CALCIUM ION: 1.29 mmol/L — AB (ref 1.12–1.23)
CHLORIDE: 104 mmol/L (ref 101–111)
CREATININE: 1 mg/dL (ref 0.61–1.24)
CREATININE: 1.1 mg/dL (ref 0.61–1.24)
Chloride: 106 mmol/L (ref 101–111)
GLUCOSE: 125 mg/dL — AB (ref 65–99)
GLUCOSE: 140 mg/dL — AB (ref 65–99)
HCT: 27 % — ABNORMAL LOW (ref 39.0–52.0)
HCT: 29 % — ABNORMAL LOW (ref 39.0–52.0)
HEMOGLOBIN: 9.2 g/dL — AB (ref 13.0–17.0)
Hemoglobin: 9.9 g/dL — ABNORMAL LOW (ref 13.0–17.0)
POTASSIUM: 4.7 mmol/L (ref 3.5–5.1)
POTASSIUM: 4.9 mmol/L (ref 3.5–5.1)
Sodium: 138 mmol/L (ref 135–145)
Sodium: 142 mmol/L (ref 135–145)
TCO2: 23 mmol/L (ref 0–100)
TCO2: 24 mmol/L (ref 0–100)

## 2015-11-26 LAB — BASIC METABOLIC PANEL
Anion gap: 5 (ref 5–15)
BUN: 9 mg/dL (ref 6–20)
CHLORIDE: 109 mmol/L (ref 101–111)
CO2: 24 mmol/L (ref 22–32)
Calcium: 9 mg/dL (ref 8.9–10.3)
Creatinine, Ser: 1.1 mg/dL (ref 0.61–1.24)
GFR calc non Af Amer: >60 mL/min (ref >60)
Glucose, Bld: 118 mg/dL — ABNORMAL HIGH (ref 65–99)
POTASSIUM: 4.6 mmol/L (ref 3.5–5.1)
SODIUM: 138 mmol/L (ref 135–145)

## 2015-11-26 LAB — CREATININE, SERUM: Creatinine, Ser: 1.28 mg/dL — ABNORMAL HIGH (ref 0.61–1.24)

## 2015-11-26 LAB — MAGNESIUM
MAGNESIUM: 2.6 mg/dL — AB (ref 1.7–2.4)
MAGNESIUM: 2.4 mg/dL (ref 1.7–2.4)

## 2015-11-26 MED ORDER — INSULIN DETEMIR 100 UNIT/ML ~~LOC~~ SOLN
15.0000 [IU] | Freq: Once | SUBCUTANEOUS | Status: AC
  Administered 2015-11-26: 15 [IU] via SUBCUTANEOUS
  Filled 2015-11-26: qty 0.15

## 2015-11-26 MED ORDER — HYDRALAZINE HCL 20 MG/ML IJ SOLN
10.0000 mg | INTRAMUSCULAR | Status: DC | PRN
  Administered 2015-11-26 – 2015-11-28 (×5): 10 mg via INTRAVENOUS
  Filled 2015-11-26 (×5): qty 1

## 2015-11-26 MED ORDER — FUROSEMIDE 10 MG/ML IJ SOLN
40.0000 mg | Freq: Once | INTRAMUSCULAR | Status: AC
  Administered 2015-11-26: 40 mg via INTRAVENOUS
  Filled 2015-11-26: qty 4

## 2015-11-26 MED ORDER — FUROSEMIDE 10 MG/ML IJ SOLN
40.0000 mg | Freq: Two times a day (BID) | INTRAMUSCULAR | Status: AC
  Administered 2015-11-26 (×2): 40 mg via INTRAVENOUS
  Filled 2015-11-26 (×2): qty 4

## 2015-11-26 MED ORDER — METOPROLOL TARTRATE 25 MG PO TABS
25.0000 mg | ORAL_TABLET | Freq: Two times a day (BID) | ORAL | Status: DC
  Administered 2015-11-26: 25 mg via ORAL
  Filled 2015-11-26: qty 1

## 2015-11-26 MED ORDER — FERROUS GLUCONATE 324 (38 FE) MG PO TABS
324.0000 mg | ORAL_TABLET | Freq: Two times a day (BID) | ORAL | Status: DC
  Administered 2015-11-26 – 2015-11-27 (×4): 324 mg via ORAL
  Filled 2015-11-26 (×6): qty 1

## 2015-11-26 MED ORDER — METOPROLOL TARTRATE 25 MG/10 ML ORAL SUSPENSION: 25.0000 mg | Freq: Two times a day (BID) | ORAL | Status: DC

## 2015-11-26 MED ORDER — INSULIN ASPART 100 UNIT/ML ~~LOC~~ SOLN
0.0000 [IU] | SUBCUTANEOUS | Status: DC
  Administered 2015-11-26: 2 [IU] via SUBCUTANEOUS
  Administered 2015-11-26: 8 [IU] via SUBCUTANEOUS
  Administered 2015-11-26: 2 [IU] via SUBCUTANEOUS
  Administered 2015-11-27: 4 [IU] via SUBCUTANEOUS
  Administered 2015-11-27 (×4): 2 [IU] via SUBCUTANEOUS
  Administered 2015-11-28: 4 [IU] via SUBCUTANEOUS
  Administered 2015-11-28: 2 [IU] via SUBCUTANEOUS

## 2015-11-26 MED ORDER — OXYCODONE HCL 5 MG PO TABS
10.0000 mg | ORAL_TABLET | ORAL | Status: DC | PRN
  Administered 2015-11-26 – 2015-11-28 (×7): 10 mg via ORAL
  Filled 2015-11-26 (×7): qty 2

## 2015-11-26 MED ORDER — METOPROLOL TARTRATE 25 MG/10 ML ORAL SUSPENSION: 50.0000 mg | Freq: Two times a day (BID) | ORAL | Status: DC

## 2015-11-26 MED ORDER — METOPROLOL TARTRATE 50 MG PO TABS
50.0000 mg | ORAL_TABLET | Freq: Two times a day (BID) | ORAL | Status: DC
  Administered 2015-11-26: 50 mg via ORAL
  Filled 2015-11-26: qty 1

## 2015-11-26 MED ORDER — METOCLOPRAMIDE HCL 5 MG/ML IJ SOLN
10.0000 mg | Freq: Four times a day (QID) | INTRAMUSCULAR | Status: AC
  Administered 2015-11-26 – 2015-11-27 (×3): 10 mg via INTRAVENOUS
  Filled 2015-11-26 (×3): qty 2

## 2015-11-26 MED ORDER — INSULIN DETEMIR 100 UNIT/ML ~~LOC~~ SOLN
10.0000 [IU] | Freq: Every day | SUBCUTANEOUS | Status: DC
  Administered 2015-11-27 – 2015-12-04 (×8): 10 [IU] via SUBCUTANEOUS
  Filled 2015-11-26 (×8): qty 0.1

## 2015-11-26 NOTE — Progress Notes (Signed)
Patient ID: [name], male   DOB: [birth year], 55 y.o.   MRN: [id number] SICU Evening Rounds  He has been hypertensive today. Will increase Lopressor to 50 bid  Diuresing well. Will give an extra dose of lasix tonight.  Some nausea and vomited once. Start Reglan  PM labs pending.  CT output low. Will plan to remove tubes in the am.

## 2015-11-26 NOTE — Progress Notes (Signed)
1 Day Post-Op Procedure(s) (LRB): CORONARY ARTERY BYPASS GRAFTING (CABG)x4 LIMA-LAD; SVG-OM; SVG-RCA; SVG-DIAG (N/A) AORTIC VALVE REPLACEMENT (AVR) USING A MAGNA EASE  (N/A) TRANSESOPHAGEAL ECHOCARDIOGRAM (TEE) (N/A) Subjective:  No complaints  Objective: Vital signs in last 24 hours: Temp:  [95 F (35 C)-100.4 F (38 C)] 98.4 F (36.9 C) (03/22 0800) Pulse Rate:  [87-96] 90 (03/22 0600) Cardiac Rhythm:  [-] Sinus tachycardia (03/22 0800) Resp:  [0-35] 18 (03/22 0800) BP: (86-160)/(64-101) 148/100 mmHg (03/22 0800) SpO2:  [95 %-100 %] 100 % (03/22 0800) Arterial Line BP: (86-227)/(52-103) 171/91 mmHg (03/22 0800) FiO2 (%):  [40 %-70 %] 40 % (03/22 0000) Weight:  [100.5 kg (221 lb 9 oz)] 100.5 kg (221 lb 9 oz) (03/22 0500)  Hemodynamic parameters for last 24 hours: PAP: (23-91)/(12-43) 65/35 mmHg CO:  [3.5 L/min-5.7 L/min] 4.7 L/min CI:  [1.8 L/min/m2-2.8 L/min/m2] 2.3 L/min/m2  Intake/Output from previous day: 03/21 0701 - 03/22 0700 In: 5060 [I.V.:1470; Blood:1240; IV Piggyback:2350] Out: 8300 [Urine:7585; Chest Tube:715] Intake/Output this shift: Total I/O In: 27.1 [I.V.:27.1] Out: -   General appearance: alert and cooperative Neurologic: intact Heart: regular rate and rhythm, S1, S2 normal, no murmur, click, rub or gallop Lungs: clear to auscultation bilaterally Extremities: edema mild Wound: dressings dry  Lab Results:  Recent Labs  11/26/15 0009 11/26/15 0416  WBC 12.7* 12.5*  HGB 8.4* 8.1*  HCT 25.3* 25.0*  PLT 179 180   BMET:  Recent Labs  11/25/15 0224  11/26/15 0002 11/26/15 0416  NA 138  < > 142 138  K 4.3  < > 4.7 4.6  CL 101  < > 106 109  CO2 25  --   --  24  GLUCOSE 130*  < > 125* 118*  BUN 18  < > 12 9  CREATININE 1.38*  < > 1.00 1.10  CALCIUM 9.7  --   --  9.0  < > = values in this interval not displayed.  PT/INR:  Recent Labs  11/25/15 1715  LABPROT 18.5*  INR 1.54*   ABG    Component Value Date/Time   PHART 7.370  11/25/2015 2357   HCO3 25.4* 11/25/2015 2357   TCO2 24 11/26/2015 0002   ACIDBASEDEF 1.0 11/25/2015 1723   O2SAT 92.0 11/25/2015 2357   CBG (last 3)   Recent Labs  11/25/15 2208 11/25/15 2306 11/26/15 0001  GLUCAP 134* 149* 125*   CXR: ok.  Assessment/Plan: S/P Procedure(s) (LRB): CORONARY ARTERY BYPASS GRAFTING (CABG)x4 LIMA-LAD; SVG-OM; SVG-RCA; SVG-DIAG (N/A) AORTIC VALVE REPLACEMENT (AVR) USING A MAGNA EASE  (N/A) TRANSESOPHAGEAL ECHOCARDIOGRAM (TEE) (N/A)  He is hypertensive this am with good cardiac output. Will stop Milrinone Increase Lopressor Mobilize Diuresis Diabetes control: preop Hgb A1c 6.3 Will keep chest tubes in today with postop coagulopathy and continued thin bloody drainage. Acute postop blood loss anemia: follow and start iron Continue foley due to patient in ICU and urinary output monitoring See progression orders   LOS: 7 days    Alleen Borne 11/26/2015

## 2015-11-26 NOTE — Care Management Note (Signed)
Case Management Note  Patient Details  Name: [name] MRN: [id number] Date of Birth: [birth year]  Subjective/Objective:       Pt is s/p CABG             Action/Plan:  PTA - pt was from home with wife; uses cane at home to assist with ambulation, wife will provide 24 hour supervision post discharge.  CM will continue to follow   Expected Discharge Date:  11/18/15               Expected Discharge Plan:  Home w Home Health Services  In-House Referral:     Discharge planning Services  CM Consult  Post Acute Care Choice:    Choice offered to:     DME Arranged:    DME Agency:     HH Arranged:    HH Agency:     Status of Service:  In process, will continue to follow  Medicare Important Message Given:  Yes Date Medicare IM Given:    Medicare IM give by:    Date Additional Medicare IM Given:    Additional Medicare Important Message give by:     If discussed at Long Length of Stay Meetings, dates discussed:    Additional Comments:  Cherylann Parr, RN 11/26/2015, 10:52 AM

## 2015-11-26 NOTE — Op Note (Signed)
NAMESEVE, MONETTE NO.:  [id number]  MEDICAL RECORD NO.:  [id number]  LOCATION:  2S12C                        FACILITY:  [facility]  PHYSICIAN:  Burna Forts, M.D.DATE OF BIRTH:  [birth year]  DATE OF PROCEDURE:  11/25/2015 DATE OF DISCHARGE:                              OPERATIVE REPORT   INDICATIONS FOR PROCEDURE:  [name] is a 55 year old, African American male, who presents today for coronary artery bypass grafting and aortic valve replacement to be performed by Dr. Evelene Croon.  On the morning of surgery, he is brought to the holding area where under local anesthesia with sedation, pulmonary artery and radial arterial lines were placed.  He is then taken to the OR for routine induction of general anesthesia, after which a TEE probe was prepared and passed oropharyngeally into the stomach, then slightly withdrawn for imaging of the cardiac structures.  PRECARDIOPULMONARY BYPASS TEE EXAMINATION:  Left ventricle:  The left ventricular chamber is seen initially in the short axis view.  There is mildly dilated left ventricular chamber appreciated.  There is slightly increased wall thickness circumferentially that is noted.  Overall, ejection fraction appears good to excellent with an estimated ejection fraction of approximately 50% or greater.  Papillary muscles are well outlined and visualized.  No other masses are appreciated.  Right ventricle:  Right ventricular chamber is seen in the four-chamber view.  It is a slightly dilated right ventricular chamber in its size. There is normal wall contractility appreciated.  Mitral valve:  The mitral valve is seen in the four-chamber view.  The leaflets are well visualized.  There is normal motion to the leaflets, normal area of coaptation is appreciated.  On color Doppler in this position and on multiple views, there is only trivial mitral regurgitant flow appreciated.  Tricuspid valve:  The tricuspid valve  is again viewed in the four- chamber view.  It is a normal compliant mobile valve.  There is trace regurgitant flow appreciated across this valve, also noted is the pulmonary artery catheter.  Left atrium: Left atrial chamber is viewed.  There is slight smoke appearance in the left atrial chamber itself.  The chamber itself appears of normal size and contour.  The left atrial appendage is visualized, there are no masses noted within.  Close interrogation of the interatrial septum noted that we could not visualize any PFO.  Right atrium:  This is a normal right atrial chamber that is appreciated.  A pulmonary artery catheter is noted within.  Attention is then taken to the aortic valve.  Aortic valve:  This is a mildly enlarged aortic root area that is seen initially in the short axis view.  Of immediate note is that this is a bicuspid valve with 2 leaflets.  They appeared open satisfactory, but there is some degree of lack of coaptation during diastole.  On long- axis views, there is a large regurgitant jet appreciated in the LVOT that fills approximately 60% of the left ventricular outflow tract. This is consistent with moderate-to-severe aortic insufficiency. Multiple views again carried out.  Again, this is a bicuspid valve. The patient is placed on cardiopulmonary bypass.  Coronary artery bypass grafting is carried  out followed by replacement of the diseased aortic valve with a #23 Magna tissue valve.  De-airing maneuvers are carried out.  The patient is then separated from cardiopulmonary bypass with the initial attempt.  POST-CARDIOPULMONARY BYPASS TEE EXAMINATION:  Left ventricle:  The left ventricular chamber indicates some mild dyssynergy of the left ventricular wall contractility in the early bypass period, but with the addition of inotropes and minutes later, the left ventricular chamber showed good excellent contractile pattern with all segmental wall areas thickening  and contractile.  Both short axis and long axis images are obtained of the left ventricular chamber both showed good left ventricular function in the post bypass.  Aortic valve:  Around the area of the disease, aortic valve could be seen the struts and findings, edges of the leaflets and cells of the tissue valve that is in the aortic position.  Both long and short axis views are obtained.  Color Doppler across this valve revealed no regurgitant jets and no aortic insufficiency is appreciated.  This appears to be a well seated aortic valve in that aortic position.  The rest of the cardiac examination is as previously described without any significant changes.  The patient was ultimately returned to the cardiac intensive care unit in stable condition.          ______________________________ Burna Forts, M.D.     JTM/MEDQ  D:  11/25/2015  T:  11/26/2015  Job:  [id number]

## 2015-11-27 ENCOUNTER — Inpatient Hospital Stay (HOSPITAL_COMMUNITY): Payer: Commercial Managed Care - HMO

## 2015-11-27 LAB — GLUCOSE, CAPILLARY
GLUCOSE-CAPILLARY: 107 mg/dL — AB (ref 65–99)
GLUCOSE-CAPILLARY: 122 mg/dL — AB (ref 65–99)
GLUCOSE-CAPILLARY: 185 mg/dL — AB (ref 65–99)
Glucose-Capillary: 127 mg/dL — ABNORMAL HIGH (ref 65–99)
Glucose-Capillary: 124 mg/dL — ABNORMAL HIGH (ref 65–99)
Glucose-Capillary: 147 mg/dL — ABNORMAL HIGH (ref 65–99)

## 2015-11-27 LAB — BASIC METABOLIC PANEL
ANION GAP: 10 (ref 5–15)
BUN: 17 mg/dL (ref 6–20)
CHLORIDE: 103 mmol/L (ref 101–111)
CO2: 25 mmol/L (ref 22–32)
Calcium: 9.3 mg/dL (ref 8.9–10.3)
Creatinine, Ser: 1.5 mg/dL — ABNORMAL HIGH (ref 0.61–1.24)
GFR calc non Af Amer: 50 mL/min — ABNORMAL LOW (ref >60)
GFR, EST AFRICAN AMERICAN: 58 mL/min — AB (ref >60)
Glucose, Bld: 139 mg/dL — ABNORMAL HIGH (ref 65–99)
POTASSIUM: 4.9 mmol/L (ref 3.5–5.1)
SODIUM: 138 mmol/L (ref 135–145)

## 2015-11-27 LAB — CBC
HCT: 25.9 % — ABNORMAL LOW (ref 39.0–52.0)
Hemoglobin: 8.1 g/dL — ABNORMAL LOW (ref 13.0–17.0)
MCH: 28.5 pg (ref 26.0–34.0)
MCHC: 31.3 g/dL (ref 30.0–36.0)
MCV: 91.2 fL (ref 78.0–100.0)
PLATELETS: 221 10*3/uL (ref 150–400)
RBC: 2.84 MIL/uL — AB (ref 4.22–5.81)
RDW: 14.8 % (ref 11.5–15.5)
WBC: 14.4 10*3/uL — AB (ref 4.0–10.5)

## 2015-11-27 MED ORDER — AMIODARONE IV BOLUS ONLY 150 MG/100ML
150.0000 mg | Freq: Once | INTRAVENOUS | Status: AC
  Administered 2015-11-27: 150 mg via INTRAVENOUS
  Filled 2015-11-27: qty 100

## 2015-11-27 MED ORDER — METOPROLOL TARTRATE 25 MG/10 ML ORAL SUSPENSION: 25.0000 mg | Freq: Two times a day (BID) | ORAL | Status: DC

## 2015-11-27 MED ORDER — METOPROLOL TARTRATE 25 MG PO TABS
25.0000 mg | ORAL_TABLET | Freq: Two times a day (BID) | ORAL | Status: DC
  Administered 2015-11-27 – 2015-11-29 (×5): 25 mg via ORAL
  Filled 2015-11-27 (×5): qty 1

## 2015-11-27 MED ORDER — AMIODARONE HCL 200 MG PO TABS
400.0000 mg | ORAL_TABLET | Freq: Two times a day (BID) | ORAL | Status: DC
  Administered 2015-11-27 – 2015-12-01 (×9): 400 mg via ORAL
  Filled 2015-11-27 (×9): qty 2

## 2015-11-27 MED FILL — Heparin Sodium (Porcine) Inj 1000 Unit/ML: INTRAMUSCULAR | Qty: 10 | Status: AC

## 2015-11-27 MED FILL — Electrolyte-R (PH 7.4) Solution: INTRAVENOUS | Qty: 6000 | Status: AC

## 2015-11-27 MED FILL — Sodium Chloride IV Soln 0.9%: INTRAVENOUS | Qty: 2000 | Status: AC

## 2015-11-27 MED FILL — Lidocaine HCl IV Inj 20 MG/ML: INTRAVENOUS | Qty: 5 | Status: AC

## 2015-11-27 MED FILL — Mannitol IV Soln 20%: INTRAVENOUS | Qty: 500 | Status: AC

## 2015-11-27 MED FILL — Heparin Sodium (Porcine) Inj 1000 Unit/ML: INTRAMUSCULAR | Qty: 30 | Status: AC

## 2015-11-27 NOTE — Progress Notes (Signed)
EKG CRITICAL VALUE     12 lead EKG performed.  Critical value noted.  Hyiu, Ksor, RN notified.   Aja Whitehair C, CCT 11/27/2015 8:45 AM

## 2015-11-27 NOTE — Care Management Important Message (Signed)
Important Message  Patient Details  Name: [name] MRN: [id number] Date of Birth: 04-29-1961   Medicare Important Message Given:  Yes    Tahirah Sara P Lucilia Yanni 11/27/2015, 12:52 PM

## 2015-11-27 NOTE — Progress Notes (Signed)
2 Days Post-Op Procedure(s) (LRB): CORONARY ARTERY BYPASS GRAFTING (CABG)x4 LIMA-LAD; SVG-OM; SVG-RCA; SVG-DIAG (N/A) AORTIC VALVE REPLACEMENT (AVR) USING A MAGNA EASE  (N/A) TRANSESOPHAGEAL ECHOCARDIOGRAM (TEE) (N/A) Subjective:  No complaints  Objective: Vital signs in last 24 hours: Temp:  [97.4 F (36.3 C)-98.8 F (37.1 C)] 98 F (36.7 C) (03/23 0345) Pulse Rate:  [47-116] 72 (03/23 0715) Cardiac Rhythm:  [-] Normal sinus rhythm (03/23 0400) Resp:  [7-42] 22 (03/23 0715) BP: (98-165)/(65-118) 101/80 mmHg (03/23 0715) SpO2:  [93 %-100 %] 97 % (03/23 0715) Arterial Line BP: (95-186)/(63-106) 156/83 mmHg (03/22 1700) Weight:  [100.7 kg (222 lb 0.1 oz)] 100.7 kg (222 lb 0.1 oz) (03/23 0500)  Hemodynamic parameters for last 24 hours: PAP: (48-65)/(22-35) 60/24 mmHg CO:  [7.5 L/min] 7.5 L/min CI:  [3.8 L/min/m2] 3.8 L/min/m2  Intake/Output from previous day: 03/22 0701 - 03/23 0700 In: 1126.3 [P.O.:480; I.V.:546.3; IV Piggyback:100] Out: 2120 [Urine:1750; Chest Tube:370] Intake/Output this shift:    General appearance: alert and cooperative Neurologic: intact Heart: regular rate and rhythm, S1, S2 normal, no murmur, click, rub or gallop Lungs: diminished breath sounds bibasilar Extremities: edema mild Wound: dressings dry  Lab Results:  Recent Labs  11/26/15 1730 11/26/15 1734 11/27/15 0440  WBC 14.4*  --  14.4*  HGB 8.5* 9.9* 8.1*  HCT 26.6* 29.0* 25.9*  PLT 209  --  221   BMET:  Recent Labs  11/26/15 0416  11/26/15 1734 11/27/15 0440  NA 138  --  138 138  K 4.6  --  4.9 4.9  CL 109  --  104 103  CO2 24  --   --  25  GLUCOSE 118*  --  140* 139*  BUN 9  --  17 17  CREATININE 1.10  < > 1.10 1.50*  CALCIUM 9.0  --   --  9.3  < > = values in this interval not displayed.  PT/INR:  Recent Labs  11/25/15 1715  LABPROT 18.5*  INR 1.54*   ABG    Component Value Date/Time   PHART 7.370 11/25/2015 2357   HCO3 25.4* 11/25/2015 2357   TCO2 23  11/26/2015 1734   ACIDBASEDEF 1.0 11/25/2015 1723   O2SAT 92.0 11/25/2015 2357   CBG (last 3)   Recent Labs  11/26/15 1917 11/26/15 2358 11/27/15 0409  GLUCAP 230* 107* 127*   CXR: ok  Assessment/Plan: S/P Procedure(s) (LRB): CORONARY ARTERY BYPASS GRAFTING (CABG)x4 LIMA-LAD; SVG-OM; SVG-RCA; SVG-DIAG (N/A) AORTIC VALVE REPLACEMENT (AVR) USING A MAGNA EASE  (N/A) TRANSESOPHAGEAL ECHOCARDIOGRAM (TEE) (N/A)  He is hemodynamically stable but still on NTG for hypertension.  Rhythm this am looks like atrial fibrillation with some sinus. Rate in the 70's. Will start amio and decrease Lopressor. Check ECG.  Diuresed yesterday but creat up a little this am. Will hold off on further diuresis for now.  DC chest tubes.  Mobilize, IS   LOS: 8 days    [name] 11/27/2015

## 2015-11-27 NOTE — Significant Event (Addendum)
Handoff report to charge RN Liborio Nixon, who will take over patient's care until 1900 shift change, which this RN will resume care of patient.

## 2015-11-27 NOTE — Significant Event (Signed)
Patient had runs of V-tach this morning at 0935. VS stable. Patient c/o nausea. Assisted patient back to bed and given antiemetic. Dr. Laneta Simmers made aware. Per MD, continue with present orders.

## 2015-11-27 NOTE — Progress Notes (Signed)
Patient ID: Jaysun Libby, male   DOB: November 21, 1960, 55 y.o.   MRN: [id number] EVENING ROUNDS NOTE :     [address]       [place] [address]             [id number]                 2 Days Post-Op Procedure(s) (LRB): CORONARY ARTERY BYPASS GRAFTING (CABG)x4 LIMA-LAD; SVG-OM; SVG-RCA; SVG-DIAG (N/A) AORTIC VALVE REPLACEMENT (AVR) USING A MAGNA EASE  (N/A) TRANSESOPHAGEAL ECHOCARDIOGRAM (TEE) (N/A)  Total Length of Stay:  LOS: 8 days  BP 141/98 mmHg  Pulse 72  Temp(Src) 97.8 F (36.6 C) (Oral)  Resp 17  Ht 5\' 4"  (1.626 m)  Wt 222 lb 0.1 oz (100.7 kg)  BMI 38.09 kg/m2  SpO2 100%  .Intake/Output      03/22 0701 - 03/23 0700 03/23 0701 - 03/24 0700   P.O. 480 120   I.V. (mL/kg) 546.3 (5.4) 172.3 (1.7)   Blood     IV Piggyback 100 50   Total Intake(mL/kg) 1126.3 (11.2) 342.3 (3.4)   Urine (mL/kg/hr) 1770 (0.7) 145 (0.1)   Emesis/NG output 0 (0)    Stool  0 (0)   Chest Tube 370 (0.2) 10 (0)   Total Output 2140 155   Net -1013.7 +187.3        Stool Occurrence  1 x   Emesis Occurrence 1 x      . sodium chloride 10 mL/hr (11/25/15 1753)  . sodium chloride    . sodium chloride Stopped (11/27/15 1200)  . insulin (NOVOLIN-R) infusion Stopped (11/26/15 1400)  . lactated ringers    . lactated ringers    . nitroGLYCERIN Stopped (11/27/15 0750)     Lab Results  Component Value Date   WBC 14.4* 11/27/2015   HGB 8.1* 11/27/2015   HCT 25.9* 11/27/2015   PLT 221 11/27/2015   GLUCOSE 139* 11/27/2015   CHOL 234* 02/24/2015   TRIG 242* 02/24/2015   HDL 39* 02/24/2015   LDLCALC 147* 02/24/2015   ALT 12* 11/25/2015   AST 29 11/25/2015   NA 138 11/27/2015   K 4.9 11/27/2015   CL 103 11/27/2015   CREATININE 1.50* 11/27/2015   BUN 17 11/27/2015   CO2 25 11/27/2015   TSH 3.027 11/15/2015   PSA 2.20 10/14/2009   INR 1.54* 11/25/2015   HGBA1C 6.3* 11/15/2015   MICROALBUR 9.67* 04/09/2010   Stable waiting for step down bed  Delight Ovens  MD  Beeper [phone] Office [phone] 11/27/2015 6:01 PM

## 2015-11-28 ENCOUNTER — Inpatient Hospital Stay (HOSPITAL_COMMUNITY): Payer: Commercial Managed Care - HMO

## 2015-11-28 LAB — BASIC METABOLIC PANEL
Anion gap: 10 (ref 5–15)
BUN: 28 mg/dL — ABNORMAL HIGH (ref 6–20)
CHLORIDE: 104 mmol/L (ref 101–111)
CO2: 23 mmol/L (ref 22–32)
CREATININE: 1.66 mg/dL — AB (ref 0.61–1.24)
Calcium: 8.9 mg/dL (ref 8.9–10.3)
GFR calc non Af Amer: 45 mL/min — ABNORMAL LOW (ref >60)
GFR, EST AFRICAN AMERICAN: 52 mL/min — AB (ref >60)
GLUCOSE: 131 mg/dL — AB (ref 65–99)
Potassium: 3.9 mmol/L (ref 3.5–5.1)
Sodium: 137 mmol/L (ref 135–145)

## 2015-11-28 LAB — GLUCOSE, CAPILLARY
GLUCOSE-CAPILLARY: 106 mg/dL — AB (ref 65–99)
GLUCOSE-CAPILLARY: 107 mg/dL — AB (ref 65–99)
GLUCOSE-CAPILLARY: 173 mg/dL — AB (ref 65–99)
GLUCOSE-CAPILLARY: 104 mg/dL — AB (ref 65–99)
Glucose-Capillary: 139 mg/dL — ABNORMAL HIGH (ref 65–99)
Glucose-Capillary: 90 mg/dL (ref 65–99)

## 2015-11-28 LAB — CBC
HCT: 26.9 % — ABNORMAL LOW (ref 39.0–52.0)
HEMOGLOBIN: 8.6 g/dL — AB (ref 13.0–17.0)
MCH: 28.9 pg (ref 26.0–34.0)
MCHC: 32 g/dL (ref 30.0–36.0)
MCV: 90.3 fL (ref 78.0–100.0)
PLATELETS: 208 10*3/uL (ref 150–400)
RBC: 2.98 MIL/uL — AB (ref 4.22–5.81)
RDW: 14.7 % (ref 11.5–15.5)
WBC: 10.7 10*3/uL — ABNORMAL HIGH (ref 4.0–10.5)

## 2015-11-28 NOTE — Plan of Care (Signed)
Problem: Safety: Goal: Ability to remain free from injury will improve Outcome: Progressing Remains safe, risk factors continue to decrease- less lines, medications, patient equipment.  Mobile.  Problem: Tissue Perfusion: Goal: Risk factors for ineffective tissue perfusion will decrease Outcome: Progressing Ambulating, up in chair most of day, vitals stable.  Problem: Fluid Volume: Goal: Ability to maintain a balanced intake and output will improve Outcome: Progressing Adequate intake and urine output.  Problem: Nutrition: Goal: Adequate nutrition will be maintained Outcome: Progressing Tolerating clear liquid diet, advancing to heart healthy carbohydrate modified.  Problem: Bowel/Gastric: Goal: Will not experience complications related to bowel motility Outcome: Progressing Had bowel movement today, distention and pain resolved, tolerating clear liquid diet

## 2015-11-28 NOTE — Progress Notes (Signed)
Patient ID: [name], male   DOB: [birth year], 55 y.o.   MRN: [id number]   SICU Evening Rounds:  Hemodynamically stable but hypertensive  in sinus rhythm.  Had large BM today and abdominal pain resolved.  Urine output ok  Will recheck labs in the am. Restart ACE when creat comes down.

## 2015-11-28 NOTE — Discharge Summary (Addendum)
Physician Discharge Summary  Patient ID: [name] MRN: [id number] DOB/AGE: [birth year] 56 y.o.  Admit date: 11/15/2015 Discharge date: 12/04/2015  Admission Diagnoses: CAD/Aortic stenosis  Discharge Diagnoses:  Active Problems:   Bicuspid aortic valve   AKI (acute kidney injury) (HCC)   Controlled type 2 diabetes mellitus with diabetic nephropathy, without long-term current use of insulin (HCC)   Chronic periodontitis   CAD (coronary artery disease)   S/P AVR  Patient Active Problem List   Diagnosis Date Noted  . S/P AVR 11/25/2015  . CAD (coronary artery disease)   . Chronic periodontitis 11/21/2015  . Bicuspid aortic valve   . AKI (acute kidney injury) (HCC)   . Controlled type 2 diabetes mellitus with diabetic nephropathy, without long-term current use of insulin (HCC)   . Essential hypertension 02/24/2015  . Dyslipidemia 02/24/2015  . GERD (gastroesophageal reflux disease) 05/02/2013  . Gout 05/02/2013   History of Present Illness: At time of admission 55 year old [place] American man with PMHx HTN, T2DM, GERD, bicuspid aortic valve, and gout presents to the ED for worsening dyspnea on exertion and chest pain. He was recently evaluated at the ED on 3/8 for a left knee acute gout flare most likely 2/2 recently more frequent EtOH use. He was started on indomethacin and prednisone and also restarted on his home lisinopril and atenolol. His gout symptoms subsequently improved.  Since starting these medications he has ben suffering worsened abdominal pain and vomiting, dyspnea and chest pain that improves at 15 minutes resting, plus anxiety and insomnia. He has felt some abdominal bloating and urinary urgency disproportionate to the volumes passed. He denies any bleeding or diarrhea. His epigastric pain is partially improved when eating. He stopped taking these medications last dose on 3/9 in PM.  After arrival to ED his symptoms were mostly improved except continued dyspnea on  exertion. CXR obtained showing mild vascular congestion. EKG, TSH, troponins negative. He was admitted for workup and evaluation of new heart failure symptoms. He was found to have some renal insufficiency at admission as well with a creatinine of 1.8. He underwent a TEE and was found to have severe aortic regurgitation. He also noted that he had a bicuspid aortic valve. Cardiology consultation was obtained with Dr. Elease Hashimoto. He is also felt to require dental consult due to the need for possible aortic valve replacement. He was seen by Dr. Kristin Bruins for this. It was ultimately determined that he would require cardiac catheterization as well and this revealed severe multivessel coronary artery disease. Due to this, cardiothoracic surgical consultation was obtained with Evelene Croon M.D. who evaluated the patient and his studies and agreed that he would benefit from combined coronary artery bypass grafting and aortic valve replacement. Prior to proceeding with cardiac surgery was felt he would require multiple teeth extractions and this was performed by Dr. Kristin Bruins on 11/21/2015. He was medically stabilized and on 11/25/2015 he was taken to the cardiac operating room where he underwent the below described procedure. He tolerated it well and was taken to the surgical intensive care unit in stable condition.  Postoperatively the patient has progressed nicely. He is maintained stable hemodynamics with some postoperative hypertension. He did require some diuresis for postoperative volume overload and will diurese further as an outpatient. . All routine lines, monitors and drainage devices have been discontinued in standard fashion. He has had postoperative atrial fibrillation has been started on amiodarone, but was stopped for increased QTc.  He is on a  beta blocker.  He is tolerating gradually increasing activities using standard protocols. Incisions are healing well without evidence of infection. For pain status is  improving with routine incentive spirometry and pulmonary toilet. He will not tolerate an ace currently with increased creat to 1.47.  He will be started on low dose Norvasc for BP control. He is stable for d/c today  Discharged Condition: good  Hospital Course: The patient was admitted for further evaluation and treatment for his dyspnea on exertion with findings concerning for congestive heart failure.  Consults: None  Significant Diagnostic Studies: routine post-op CXR/Labs  Treatments: surgery: CARDIOVASCULAR SURGERY OPERATIVE NOTE  11/25/2015  Surgeon: Alleen Borne, MD  First Assistant: Gershon Crane, PA-C   Preoperative Diagnosis: Severe multi-vessel coronary artery disease and bicuspid aortic valve with severe aortic insufficiency   Postoperative Diagnosis: Same   Procedure:  1. Median Sternotomy 2. Extracorporeal circulation 3. Coronary artery bypass grafting x 4   Left internal mammary graft to the LAD  SVG to diagonal  SVG to Om  SVG to RCA 4. Endoscopic vein harvest from the right leg 5. Aortic valve replacement using a 23 mm Edwards Magna-Ease pericardial valve   Anesthesia: General Endotracheal   Discharge Exam: Blood pressure 154/60, pulse 64, temperature 98.3 F (36.8 C), temperature source Oral, resp. rate 18, height 5\' 4"  (1.626 m), weight 212 lb 8 oz (96.389 kg), SpO2 100 %.   General appearance: alert, cooperative and no distress Heart: regular rate and rhythm Lungs: dim in bases Abdomen: benign Extremities: + edema Wound: incis healing well Disposition: 01-Home or Self Care      Discharge Instructions    AMB Referral to [facility] Care Management    Complete by:  As directed   Please assign to [facility] RNCM for disease and symptom management for CHF. Also history of DM, HTN. Written consent obtained. Please call with questions. Thanks. Raiford Noble, MSN-Ed, RN,BSN [facility] [phone]  Reason for  consult:  Please assign to [facility] RNCM  Expected date of contact:  1-3 days (reserved for hospital discharges)     Amb Referral to Cardiac Rehabilitation    Complete by:  As directed   Diagnosis:   CABG Valve Replacement/Repair    Valve:  Aortic            Medication List    STOP taking these medications        acetaminophen 325 MG tablet  Commonly known as:  TYLENOL     atenolol 100 MG tablet  Commonly known as:  TENORMIN     hydrochlorothiazide 25 MG tablet  Commonly known as:  HYDRODIURIL     ibuprofen 200 MG tablet  Commonly known as:  ADVIL,MOTRIN     indomethacin 25 MG capsule  Commonly known as:  INDOCIN     lisinopril 40 MG tablet  Commonly known as:  PRINIVIL,ZESTRIL     naproxen sodium 220 MG tablet  Commonly known as:  ANAPROX     predniSONE 20 MG tablet  Commonly known as:  DELTASONE      TAKE these medications        allopurinol 300 MG tablet  Commonly known as:  ZYLOPRIM  Take 1 tablet (300 mg total) by mouth daily.     amLODipine 5 MG tablet  Commonly known as:  NORVASC  Take 1 tablet (5 mg total) by mouth daily.     aspirin 325 MG EC tablet  Take 1 tablet (325 mg total) by mouth daily.  azelastine 0.1 % nasal spray  Commonly known as:  ASTELIN  Place 1 spray into both nostrils every other day. Use in each nostril as directed     colchicine 0.6 MG tablet  Take 1 tablet (0.6 mg total) by mouth 2 (two) times daily. For gout pain     esomeprazole 40 MG capsule  Commonly known as:  NEXIUM  Take 1 capsule (40 mg total) by mouth daily.     ferrous sulfate 325 (65 FE) MG tablet  Take 1 tablet (325 mg total) by mouth daily with breakfast.     Fish Oil 1000 MG Caps  Take 1,000 mg by mouth daily.     folic acid 1 MG tablet  Commonly known as:  FOLVITE  Take 1 tablet (1 mg total) by mouth daily.     furosemide 40 MG tablet  Commonly known as:  LASIX  Take 1 tablet (40 mg total) by mouth daily.     metFORMIN 500 MG tablet  Commonly  known as:  GLUCOPHAGE  Take 1 tablet (500 mg total) by mouth 2 (two) times daily with a meal.     metoprolol tartrate 25 MG tablet  Commonly known as:  LOPRESSOR  Take 1 tablet (25 mg total) by mouth 2 (two) times daily.     multivitamin with minerals Tabs tablet  Take 1 tablet by mouth daily.     oxyCODONE-acetaminophen 5-325 MG tablet  Commonly known as:  PERCOCET/ROXICET  Take 1-2 tablets by mouth every 6 (six) hours as needed for severe pain.     potassium chloride SA 20 MEQ tablet  Commonly known as:  K-DUR,KLOR-CON  Take 1 tablet (20 mEq total) by mouth daily.     pravastatin 20 MG tablet  Commonly known as:  PRAVACHOL  Take 1 tablet (20 mg total) by mouth at bedtime.     sildenafil 100 MG tablet  Commonly known as:  VIAGRA  Take 0.5 tablets (50 mg total) by mouth as needed for erectile dysfunction.       Follow-up Information    Follow up with Alleen Borne, MD.   Specialty:  Cardiothoracic Surgery   Why:  Appointment to see the surgeon on 12/31/2015 at 12 noon. Please obtain a chest x-ray [place] imaging at 11:30 AM. [facility] imaging is located in the same office complex.   Contact information:   [address] [employer] [address] [place] [address] [phone]       Follow up with Nahser, Deloris Ping, MD.   Specialty:  Cardiology   Why:  2 week cardiology follow-up appt4/19/17 at 8:30 am   Contact information:   [address] [place] [address] [phone]       Follow up with [facility]   Specialty:  Cardiothoracic Surgery   Why:  nurse appt on 12/05/15 at 10:15 for nurse to remove sutures   Contact information:   [address] [place], [address] [place] [address] [phone]     The patient has been discharged on:   1.Beta Blocker:  Yes [ y  ]                              No   [   ]  If No, reason:  2.Ace Inhibitor/ARB: Yes [   ]                                      No  [ n   ]                                     If No, reason:renal insuff  3.Statin:   Yes [ y  ]                  No  [   ]                  If No, reason:  4.Ecasa:  Yes  Cove.Etienne   ]                  No   [   ]                  If No, reason: Signed: BARRETT, ERIN 12/04/2015, 8:44 AM

## 2015-11-28 NOTE — Progress Notes (Signed)
3 Days Post-Op Procedure(s) (LRB): CORONARY ARTERY BYPASS GRAFTING (CABG)x4 LIMA-LAD; SVG-OM; SVG-RCA; SVG-DIAG (N/A) AORTIC VALVE REPLACEMENT (AVR) USING A MAGNA EASE  (N/A) TRANSESOPHAGEAL ECHOCARDIOGRAM (TEE) (N/A) Subjective: Complains of abdominal pain that feels like gas. No nausea. Passing flatus and had large BM yesterday.  Objective: Vital signs in last 24 hours: Temp:  [97.5 F (36.4 C)-98.4 F (36.9 C)] 98.4 F (36.9 C) (03/24 0400) Pulse Rate:  [65-81] 76 (03/24 0700) Cardiac Rhythm:  [-] Normal sinus rhythm (03/23 2300) Resp:  [12-28] 26 (03/24 0700) BP: (96-163)/(76-109) 141/93 mmHg (03/24 0700) SpO2:  [91 %-100 %] 94 % (03/24 0700) Weight:  [101.1 kg (222 lb 14.2 oz)] 101.1 kg (222 lb 14.2 oz) (03/24 0600)  Hemodynamic parameters for last 24 hours:    Intake/Output from previous day: 03/23 0701 - 03/24 0700 In: 822.3 [P.O.:600; I.V.:172.3; IV Piggyback:50] Out: 830 [Urine:820; Chest Tube:10] Intake/Output this shift:    General appearance: alert, cooperative and looks uncomfortable Heart: regular rate and rhythm, S1, S2 normal, no murmur, click, rub or gallop Lungs: clear to auscultation bilaterally Abdomen: distended, non-tender; active bowel sounds Extremities: edema mild Wound: incisions ok  Lab Results:  Recent Labs  11/27/15 0440 11/28/15 0455  WBC 14.4* 10.7*  HGB 8.1* 8.6*  HCT 25.9* 26.9*  PLT 221 208   BMET:  Recent Labs  11/27/15 0440 11/28/15 0455  NA 138 137  K 4.9 3.9  CL 103 104  CO2 25 23  GLUCOSE 139* 131*  BUN 17 28*  CREATININE 1.50* 1.66*  CALCIUM 9.3 8.9    PT/INR:  Recent Labs  11/25/15 1715  LABPROT 18.5*  INR 1.54*   ABG    Component Value Date/Time   PHART 7.370 11/25/2015 2357   HCO3 25.4* 11/25/2015 2357   TCO2 23 11/26/2015 1734   ACIDBASEDEF 1.0 11/25/2015 1723   O2SAT 92.0 11/25/2015 2357   CBG (last 3)   Recent Labs  11/27/15 1917 11/27/15 2343 11/28/15 0348  GLUCAP 185* 90 106*    CXR: ok  Assessment/Plan: S/P Procedure(s) (LRB): CORONARY ARTERY BYPASS GRAFTING (CABG)x4 LIMA-LAD; SVG-OM; SVG-RCA; SVG-DIAG (N/A) AORTIC VALVE REPLACEMENT (AVR) USING A MAGNA EASE  (N/A) TRANSESOPHAGEAL ECHOCARDIOGRAM (TEE) (N/A)  He is hemodynamically stable with some hypertension requiring prn hydralazine.  Maintaining sinus on amio and lopressor  Creatinine up a little higher today so will hold off on diuresis and observe. Urine output ok  His abdominal pain sounds like gas with active bowel sounds, passing flatus, no nausea, bowel movement yesterday. Will keep on clear liquids for now and observe.  Mobilize and IS.   LOS: 9 days    Alleen Borne 11/28/2015

## 2015-11-28 NOTE — Progress Notes (Signed)
Patient currently in atrial fibrillation, rate controlled, 100-110 BPM.  Asymptomatic.  Dr. Laneta Simmers aware, no new orders. Lindajo Royal, RN

## 2015-11-29 LAB — CBC
HCT: 24.1 % — ABNORMAL LOW (ref 39.0–52.0)
Hemoglobin: 7.8 g/dL — ABNORMAL LOW (ref 13.0–17.0)
MCH: 29.1 pg (ref 26.0–34.0)
MCHC: 32.4 g/dL (ref 30.0–36.0)
MCV: 89.9 fL (ref 78.0–100.0)
PLATELETS: 279 10*3/uL (ref 150–400)
RBC: 2.68 MIL/uL — AB (ref 4.22–5.81)
RDW: 14.5 % (ref 11.5–15.5)
WBC: 13.4 10*3/uL — AB (ref 4.0–10.5)

## 2015-11-29 LAB — BASIC METABOLIC PANEL
ANION GAP: 9 (ref 5–15)
BUN: 35 mg/dL — AB (ref 6–20)
CALCIUM: 9.1 mg/dL (ref 8.9–10.3)
CO2: 24 mmol/L (ref 22–32)
Chloride: 103 mmol/L (ref 101–111)
Creatinine, Ser: 1.43 mg/dL — ABNORMAL HIGH (ref 0.61–1.24)
GFR calc Af Amer: >60 mL/min (ref >60)
GFR, EST NON AFRICAN AMERICAN: 53 mL/min — AB (ref >60)
GLUCOSE: 111 mg/dL — AB (ref 65–99)
Potassium: 3.9 mmol/L (ref 3.5–5.1)
SODIUM: 136 mmol/L (ref 135–145)

## 2015-11-29 LAB — GLUCOSE, CAPILLARY
GLUCOSE-CAPILLARY: 102 mg/dL — AB (ref 65–99)
GLUCOSE-CAPILLARY: 110 mg/dL — AB (ref 65–99)
GLUCOSE-CAPILLARY: 137 mg/dL — AB (ref 65–99)
Glucose-Capillary: 96 mg/dL (ref 65–99)
Glucose-Capillary: 110 mg/dL — ABNORMAL HIGH (ref 65–99)
Glucose-Capillary: 146 mg/dL — ABNORMAL HIGH (ref 65–99)

## 2015-11-29 MED ORDER — ASPIRIN EC 325 MG PO TBEC
325.0000 mg | DELAYED_RELEASE_TABLET | Freq: Every day | ORAL | Status: DC
Start: 2015-11-29 — End: 2015-12-04
  Administered 2015-11-30 – 2015-12-04 (×5): 325 mg via ORAL
  Filled 2015-11-29 (×5): qty 1

## 2015-11-29 MED ORDER — INSULIN ASPART 100 UNIT/ML ~~LOC~~ SOLN
0.0000 [IU] | Freq: Three times a day (TID) | SUBCUTANEOUS | Status: DC
  Administered 2015-11-29 – 2015-12-03 (×4): 2 [IU] via SUBCUTANEOUS

## 2015-11-29 MED ORDER — ACETAMINOPHEN 325 MG PO TABS
650.0000 mg | ORAL_TABLET | Freq: Four times a day (QID) | ORAL | Status: DC | PRN
  Administered 2015-11-30: 650 mg via ORAL

## 2015-11-29 MED ORDER — ONDANSETRON HCL 4 MG PO TABS: 4.0000 mg | ORAL_TABLET | Freq: Four times a day (QID) | ORAL | Status: DC | PRN

## 2015-11-29 MED ORDER — SODIUM CHLORIDE 0.9 % IV SOLN: 250.0000 mL | INTRAVENOUS | Status: DC | PRN

## 2015-11-29 MED ORDER — LISINOPRIL 10 MG PO TABS
20.0000 mg | ORAL_TABLET | Freq: Every day | ORAL | Status: DC
  Administered 2015-11-29 – 2015-12-01 (×3): 20 mg via ORAL
  Filled 2015-11-29 (×2): qty 2
  Filled 2015-11-29: qty 1

## 2015-11-29 MED ORDER — FAMOTIDINE 20 MG PO TABS
20.0000 mg | ORAL_TABLET | Freq: Two times a day (BID) | ORAL | Status: DC
Start: 2015-11-29 — End: 2015-12-04
  Administered 2015-11-29 – 2015-12-04 (×10): 20 mg via ORAL
  Filled 2015-11-29 (×10): qty 1

## 2015-11-29 MED ORDER — MOVING RIGHT ALONG BOOK
Freq: Once | Status: AC
  Administered 2015-11-29: 21:00:00
  Filled 2015-11-29 (×2): qty 1

## 2015-11-29 MED ORDER — SODIUM CHLORIDE 0.9% FLUSH: 3.0000 mL | INTRAVENOUS | Status: DC | PRN

## 2015-11-29 MED ORDER — TRAMADOL HCL 50 MG PO TABS
50.0000 mg | ORAL_TABLET | ORAL | Status: DC | PRN
  Administered 2015-11-30: 100 mg via ORAL
  Administered 2015-11-30: 50 mg via ORAL
  Administered 2015-12-01 – 2015-12-03 (×6): 100 mg via ORAL
  Administered 2015-12-03: 50 mg via ORAL
  Administered 2015-12-03: 100 mg via ORAL
  Filled 2015-11-29 (×3): qty 2
  Filled 2015-11-29: qty 1
  Filled 2015-11-29 (×2): qty 2
  Filled 2015-11-29: qty 1
  Filled 2015-11-29 (×3): qty 2

## 2015-11-29 MED ORDER — SODIUM CHLORIDE 0.9% FLUSH
3.0000 mL | Freq: Two times a day (BID) | INTRAVENOUS | Status: DC
Start: 2015-11-29 — End: 2015-12-04
  Administered 2015-11-29 – 2015-12-03 (×6): 3 mL via INTRAVENOUS

## 2015-11-29 MED ORDER — METOPROLOL TARTRATE 25 MG PO TABS
25.0000 mg | ORAL_TABLET | Freq: Two times a day (BID) | ORAL | Status: DC
  Administered 2015-11-29 – 2015-12-04 (×10): 25 mg via ORAL
  Filled 2015-11-29 (×10): qty 1

## 2015-11-29 MED ORDER — ONDANSETRON HCL 4 MG/2ML IJ SOLN: 4.0000 mg | Freq: Four times a day (QID) | INTRAMUSCULAR | Status: DC | PRN

## 2015-11-29 NOTE — Progress Notes (Signed)
4 Days Post-Op Procedure(s) (LRB): CORONARY ARTERY BYPASS GRAFTING (CABG)x4 LIMA-LAD; SVG-OM; SVG-RCA; SVG-DIAG (N/A) AORTIC VALVE REPLACEMENT (AVR) USING A MAGNA EASE  (N/A) TRANSESOPHAGEAL ECHOCARDIOGRAM (TEE) (N/A) Subjective: No complaints. Had 3 BM's today. No abdominal pain  Objective: Vital signs in last 24 hours: Temp:  [97.5 F (36.4 C)-98.3 F (36.8 C)] 97.9 F (36.6 C) (03/25 1211) Pulse Rate:  [66-111] 111 (03/25 1300) Cardiac Rhythm:  [-] Normal sinus rhythm (03/25 1200) Resp:  [12-31] 22 (03/25 1300) BP: (107-170)/(75-120) 132/90 mmHg (03/25 1300) SpO2:  [92 %-99 %] 94 % (03/25 1300) Weight:  [100.2 kg (220 lb 14.4 oz)] 100.2 kg (220 lb 14.4 oz) (03/25 0500)  Hemodynamic parameters for last 24 hours:    Intake/Output from previous day: 03/24 0701 - 03/25 0700 In: 2280 [P.O.:2280] Out: 883 [Urine:880; Stool:3] Intake/Output this shift:    General appearance: alert and cooperative Neurologic: intact Heart: regular rate and rhythm, S1, S2 normal, no murmur, click, rub or gallop Lungs: clear to auscultation bilaterally Abdomen: soft, non-tender; bowel sounds normal; no masses,  no organomegaly Extremities: edema mild Wound: incisions ok  Lab Results:  Recent Labs  11/28/15 0455 11/29/15 0423  WBC 10.7* 13.4*  HGB 8.6* 7.8*  HCT 26.9* 24.1*  PLT 208 279   BMET:  Recent Labs  11/28/15 0455 11/29/15 0423  NA 137 136  K 3.9 3.9  CL 104 103  CO2 23 24  GLUCOSE 131* 111*  BUN 28* 35*  CREATININE 1.66* 1.43*  CALCIUM 8.9 9.1    PT/INR: No results for input(s): LABPROT, INR in the last 72 hours. ABG    Component Value Date/Time   PHART 7.370 11/25/2015 2357   HCO3 25.4* 11/25/2015 2357   TCO2 23 11/26/2015 1734   ACIDBASEDEF 1.0 11/25/2015 1723   O2SAT 92.0 11/25/2015 2357   CBG (last 3)   Recent Labs  11/29/15 0408 11/29/15 0803 11/29/15 1210  GLUCAP 96 110* 110*    Assessment/Plan: S/P Procedure(s) (LRB): CORONARY ARTERY  BYPASS GRAFTING (CABG)x4 LIMA-LAD; SVG-OM; SVG-RCA; SVG-DIAG (N/A) AORTIC VALVE REPLACEMENT (AVR) USING A MAGNA EASE  (N/A) TRANSESOPHAGEAL ECHOCARDIOGRAM (TEE) (N/A)  He is hemodynamically stable in sinus rhythm but BP still higher than ideal. Will resume lisinopril 20 mg since creat coming back down.  Continue amio and lopressor for postop atrial fib.  DM: glucose under good control on Levemir and SSI. Will wait for creat to normalize before starting Metformin.  Transfer to 2W and continue mobilization.   LOS: 10 days    Alleen Borne 11/29/2015

## 2015-11-29 NOTE — Progress Notes (Signed)
Attempted to call report; RN unable to take report at this time; will re attempt.   Benjamine Sprague

## 2015-11-29 NOTE — Progress Notes (Signed)
Pt transferred to 2W37; pt ambulated entire distance; SCD's, chart, and personal belongings including cell phone and charger along with transfer; pt made family aware of transfer; pt tolerated ambulation well; RN and NT in room upon transfer; VSS upon transfer.  [name]

## 2015-11-30 LAB — BASIC METABOLIC PANEL
Anion gap: 11 (ref 5–15)
BUN: 35 mg/dL — ABNORMAL HIGH (ref 6–20)
CHLORIDE: 103 mmol/L (ref 101–111)
CO2: 21 mmol/L — AB (ref 22–32)
Calcium: 8.9 mg/dL (ref 8.9–10.3)
Creatinine, Ser: 1.46 mg/dL — ABNORMAL HIGH (ref 0.61–1.24)
GFR calc Af Amer: >60 mL/min (ref >60)
GFR calc non Af Amer: 52 mL/min — ABNORMAL LOW (ref >60)
GLUCOSE: 109 mg/dL — AB (ref 65–99)
Potassium: 3.7 mmol/L (ref 3.5–5.1)
Sodium: 135 mmol/L (ref 135–145)

## 2015-11-30 LAB — CBC
HEMATOCRIT: 25.4 % — AB (ref 39.0–52.0)
HEMOGLOBIN: 8.2 g/dL — AB (ref 13.0–17.0)
MCH: 28.9 pg (ref 26.0–34.0)
MCHC: 32.3 g/dL (ref 30.0–36.0)
MCV: 89.4 fL (ref 78.0–100.0)
Platelets: 361 10*3/uL (ref 150–400)
RBC: 2.84 MIL/uL — ABNORMAL LOW (ref 4.22–5.81)
RDW: 14.2 % (ref 11.5–15.5)
WBC: 13.2 10*3/uL — ABNORMAL HIGH (ref 4.0–10.5)

## 2015-11-30 LAB — GLUCOSE, CAPILLARY
GLUCOSE-CAPILLARY: 142 mg/dL — AB (ref 65–99)
GLUCOSE-CAPILLARY: 95 mg/dL (ref 65–99)
GLUCOSE-CAPILLARY: 98 mg/dL (ref 65–99)
Glucose-Capillary: 107 mg/dL — ABNORMAL HIGH (ref 65–99)

## 2015-11-30 MED ORDER — FERROUS SULFATE 325 (65 FE) MG PO TABS
325.0000 mg | ORAL_TABLET | Freq: Every day | ORAL | Status: DC
  Administered 2015-11-30 – 2015-12-03 (×4): 325 mg via ORAL
  Filled 2015-11-30 (×4): qty 1

## 2015-11-30 MED ORDER — FOLIC ACID 1 MG PO TABS
1.0000 mg | ORAL_TABLET | Freq: Every day | ORAL | Status: DC
  Administered 2015-11-30 – 2015-12-04 (×5): 1 mg via ORAL
  Filled 2015-11-30 (×5): qty 1

## 2015-11-30 MED ORDER — FERROUS SULFATE 325 (65 FE) MG PO TABS: 325.0000 mg | ORAL_TABLET | Freq: Every day | ORAL | Status: DC

## 2015-11-30 MED ORDER — FUROSEMIDE 40 MG PO TABS
40.0000 mg | ORAL_TABLET | Freq: Every day | ORAL | Status: DC
  Administered 2015-11-30 – 2015-12-04 (×5): 40 mg via ORAL
  Filled 2015-11-30 (×5): qty 1

## 2015-11-30 MED ORDER — POTASSIUM CHLORIDE CRYS ER 20 MEQ PO TBCR
30.0000 meq | EXTENDED_RELEASE_TABLET | Freq: Once | ORAL | Status: DC
  Administered 2015-11-30: 30 meq via ORAL

## 2015-11-30 MED ORDER — POTASSIUM CHLORIDE CRYS ER 20 MEQ PO TBCR
20.0000 meq | EXTENDED_RELEASE_TABLET | Freq: Every day | ORAL | Status: DC
  Administered 2015-11-30 – 2015-12-01 (×2): 20 meq via ORAL
  Filled 2015-11-30 (×2): qty 1

## 2015-11-30 MED ORDER — POTASSIUM CHLORIDE CRYS ER 20 MEQ PO TBCR
30.0000 meq | EXTENDED_RELEASE_TABLET | Freq: Two times a day (BID) | ORAL | Status: DC
  Administered 2015-11-30 – 2015-12-01 (×3): 30 meq via ORAL
  Filled 2015-11-30 (×2): qty 1

## 2015-11-30 NOTE — Progress Notes (Signed)
dc'ed pacing wires pt. tolerated well 

## 2015-11-30 NOTE — Progress Notes (Addendum)
      [address]       [place] [address]             [phone]        5 Days Post-Op Procedure(s) (LRB): CORONARY ARTERY BYPASS GRAFTING (CABG)x4 LIMA-LAD; SVG-OM; SVG-RCA; SVG-DIAG (N/A) AORTIC VALVE REPLACEMENT (AVR) USING A MAGNA EASE  (N/A) TRANSESOPHAGEAL ECHOCARDIOGRAM (TEE) (N/A)  Subjective: Patient's only complaint is several loose stools.  Objective: Vital signs in last 24 hours: Temp:  [97.9 F (36.6 C)-98.8 F (37.1 C)] 98.1 F (36.7 C) (03/26 0523) Pulse Rate:  [68-76] 69 (03/26 0523) Cardiac Rhythm:  [-] Normal sinus rhythm (03/26 0822) Resp:  [18-30] 18 (03/26 0523) BP: (117-163)/(75-108) 117/76 mmHg (03/26 0523) SpO2:  [92 %-100 %] 99 % (03/26 0523) Weight:  [219 lb 14.4 oz (99.746 kg)] 219 lb 14.4 oz (99.746 kg) (03/26 0523)  Pre op weight 94 kg Current Weight  11/30/15 219 lb 14.4 oz (99.746 kg)      Intake/Output from previous day: 03/25 0701 - 03/26 0700 In: 120 [P.O.:120] Out: -    Physical Exam:  Cardiovascular: RRR, no murmur Pulmonary: Clear to auscultation bilaterally; no rales, wheezes, or rhonchi. Abdomen: Soft, non tender, bowel sounds present. Extremities: Bilateral lower extremity edema. Wounds: Clean and dry.  No erythema or signs of infection.  Lab Results: CBC: Recent Labs  11/29/15 0423 11/30/15 0756  WBC 13.4* 13.2*  HGB 7.8* 8.2*  HCT 24.1* 25.4*  PLT 279 361   BMET:  Recent Labs  11/28/15 0455 11/29/15 0423  NA 137 136  K 3.9 3.9  CL 104 103  CO2 23 24  GLUCOSE 131* 111*  BUN 28* 35*  CREATININE 1.66* 1.43*  CALCIUM 8.9 9.1    PT/INR:  Lab Results  Component Value Date   INR 1.54* 11/25/2015   INR 1.82* 11/25/2015   INR 1.14 11/19/2015   ABG:  INR: Will add last result for INR, ABG once components are confirmed Will add last 4 CBG results once components are confirmed  Assessment/Plan:  1. CV - Previous a fib. Did have a pause earlier.Maintaining SR/first degree AV  block with HR in the 70's. On Amiodarone 400 mg bid, Lopressor 25 mg bid, and Lisinopril 20 mg daily. If creatinine continues to increase will stop Lisinopril 2.  Pulmonary - On room air. Encourage incentive spirometer 3. Volume Overload - Will put on Lasix 40 mg as is above pre op weight and monitor creatinine 4.  Acute blood loss anemia - H and H stable at 8.2 and 25.4. Start oral iron and folic acid. 5. DM-CBGs 146/137/142. On Insulin. Will restart Metformin 500 mg bid once creatinine normalizes. Pre op HGA1C 6.3 6. Remove EPW 7. Regarding loose stools, not on stool softeners. Monitor but hopefully will lessen soon 8. Creatinine still 1.46. Re check in am. Baseline around 1 9. Supplement potassium 10. Possible discharge in 1-2 days  ZIMMERMAN,DONIELLE MPA-C 11/30/2015,9:06 AM   Chart reviewed, patient examined, agree with above. He looks good. Possibly home tomorrow.

## 2015-12-01 ENCOUNTER — Other Ambulatory Visit: Payer: Self-pay | Admitting: *Deleted

## 2015-12-01 ENCOUNTER — Encounter: Payer: Self-pay | Admitting: *Deleted

## 2015-12-01 ENCOUNTER — Inpatient Hospital Stay (HOSPITAL_COMMUNITY): Payer: Commercial Managed Care - HMO

## 2015-12-01 DIAGNOSIS — M79661 Pain in right lower leg: Secondary | ICD-10-CM

## 2015-12-01 DIAGNOSIS — M7989 Other specified soft tissue disorders: Secondary | ICD-10-CM

## 2015-12-01 LAB — GLUCOSE, CAPILLARY
GLUCOSE-CAPILLARY: 105 mg/dL — AB (ref 65–99)
GLUCOSE-CAPILLARY: 114 mg/dL — AB (ref 65–99)
GLUCOSE-CAPILLARY: 121 mg/dL — AB (ref 65–99)
GLUCOSE-CAPILLARY: 109 mg/dL — AB (ref 65–99)

## 2015-12-01 LAB — BASIC METABOLIC PANEL
Anion gap: 10 (ref 5–15)
BUN: 36 mg/dL — AB (ref 6–20)
CO2: 21 mmol/L — ABNORMAL LOW (ref 22–32)
CREATININE: 1.63 mg/dL — AB (ref 0.61–1.24)
Calcium: 8.8 mg/dL — ABNORMAL LOW (ref 8.9–10.3)
Chloride: 102 mmol/L (ref 101–111)
GFR calc Af Amer: 53 mL/min — ABNORMAL LOW (ref >60)
GFR, EST NON AFRICAN AMERICAN: 46 mL/min — AB (ref >60)
Glucose, Bld: 120 mg/dL — ABNORMAL HIGH (ref 65–99)
Potassium: 4.3 mmol/L (ref 3.5–5.1)
SODIUM: 133 mmol/L — AB (ref 135–145)

## 2015-12-01 MED ORDER — ASPIRIN 325 MG PO TBEC: 325.0000 mg | DELAYED_RELEASE_TABLET | Freq: Every day | ORAL | Status: DC

## 2015-12-01 MED ORDER — POTASSIUM CHLORIDE CRYS ER 20 MEQ PO TBCR: 20.0000 meq | EXTENDED_RELEASE_TABLET | Freq: Every day | ORAL | Status: DC

## 2015-12-01 MED ORDER — FERROUS SULFATE 325 (65 FE) MG PO TABS: 325.0000 mg | ORAL_TABLET | Freq: Every day | ORAL | Status: DC

## 2015-12-01 MED ORDER — METOPROLOL TARTRATE 25 MG PO TABS: 25.0000 mg | ORAL_TABLET | Freq: Two times a day (BID) | ORAL | Status: DC

## 2015-12-01 MED ORDER — FOLIC ACID 1 MG PO TABS: 1.0000 mg | ORAL_TABLET | Freq: Every day | ORAL | Status: DC

## 2015-12-01 MED ORDER — FUROSEMIDE 40 MG PO TABS: 40.0000 mg | ORAL_TABLET | Freq: Every day | ORAL | Status: DC

## 2015-12-01 MED ORDER — ATORVASTATIN CALCIUM 40 MG PO TABS: 40.0000 mg | ORAL_TABLET | Freq: Every day | ORAL | Status: DC

## 2015-12-01 MED ORDER — FUROSEMIDE 10 MG/ML IJ SOLN
40.0000 mg | Freq: Once | INTRAMUSCULAR | Status: AC
  Administered 2015-12-01: 40 mg via INTRAVENOUS
  Filled 2015-12-01: qty 4

## 2015-12-01 MED ORDER — AMIODARONE HCL 200 MG PO TABS: 200.0000 mg | ORAL_TABLET | Freq: Every day | ORAL | Status: DC

## 2015-12-01 MED ORDER — ENOXAPARIN SODIUM 40 MG/0.4ML ~~LOC~~ SOLN
40.0000 mg | SUBCUTANEOUS | Status: DC
  Administered 2015-12-01 – 2015-12-03 (×3): 40 mg via SUBCUTANEOUS
  Filled 2015-12-01 (×3): qty 0.4

## 2015-12-01 NOTE — Progress Notes (Signed)
CARDIAC REHAB PHASE I   PRE:  Rate/Rhythm: 63 SR  BP:  Sitting: 119/78        SaO2: 98 RA  MODE:  Ambulation: 550 ft   POST:  Rate/Rhythm: 76 SR  BP:  Sitting: 166/86         SaO2: 96 RA  Pt ambulated 550 ft on RA, hand held assist, slow, steady gait, tolerated well with no complaints, brief standing rest x1. Pt to recliner after walk, feet elevated, call bell within reach. Will return at 11am to complete discharge education with pt wife present.  [id number] Joylene Grapes, RN, BSN 12/01/2015 9:20 AM

## 2015-12-01 NOTE — Care Management Note (Signed)
Case Management Note  Patient Details  Name: Jaelyn Alvarado MRN: [id number] Date of Birth: [birth year]  Subjective/Objective:       Pt is s/p CABG             Action/Plan:  PTA - pt was from home with wife; uses cane at home to assist with ambulation, wife will provide 24 hour supervision post discharge.  CM will continue to follow   Expected Discharge Date: 12/01/15             Expected Discharge Plan:  Home w Home Health Services  In-House Referral:     Discharge planning Services  CM Consult  Post Acute Care Choice:  NA Choice offered to:  NA  DME Arranged:    DME Agency:     HH Arranged:    HH Agency:     Status of Service:  Completed, signed off  Medicare Important Message Given:  Yes Date Medicare IM Given:    Medicare IM give by:    Date Additional Medicare IM Given:    Additional Medicare Important Message give by:     If discussed at Long Length of Stay Meetings, dates discussed:    Discharge Disposition: home/self care   Additional Comments:  Darrold Span, RN 12/01/2015, 12:08 PM

## 2015-12-01 NOTE — Progress Notes (Signed)
VASCULAR LAB PRELIMINARY  PRELIMINARY  PRELIMINARY  PRELIMINARY  Right lower extremity venous duplex completed.    Preliminary report:  There is no DVT noted in the right lower extremity.  There is interstitial fluid noted in the calf.   Coutney Wildermuth, RVT 12/01/2015, 2:20 PM

## 2015-12-01 NOTE — Care Management Important Message (Signed)
Important Message  Patient Details  Name: Emmerich Padalino MRN: [id number] Date of Birth: 1961/03/30   Medicare Important Message Given:  Yes    Bernadette Hoit 12/01/2015, 12:48 PM

## 2015-12-01 NOTE — Consult Note (Signed)
   [facility] CM Inpatient Consult   12/01/2015  [name] [birth year] [id number]   Went to bedside to speak with patient at bedside to discuss and offer [facility] Care Management program services. He is agreeable and written consent obtained. Explained to Mr. Wengel that he will receive post hospital transition of care calls and will be evaluated for monthly home visits. Explained that [facility] Care Management will not interfere with any other services he may have post discharge. Confirmed best contact number as [phone]. Confirmed Primary Care MD is with [facility] and Wellness. Will request for patient to be assigned to [facility] for CHF disease and symptom management. He also has history of DM, and HTN. Will make inpatient RNCM aware that [facility] Care Management will follow post discharge.    Raiford Noble, MSN-Ed, RN,BSN [facility] Liaison [phone]

## 2015-12-01 NOTE — Consult Note (Signed)
   [facility] CM Inpatient Consult   12/01/2015  [name] [birth year] [phone]   Patient screened for [facility] Care Management services. Went to bedside with intentions to offer and explain [facility] Care Management program with patient. However, he was in the bathroom upon visit. Will come back at a later time.    Raiford Noble, MSN-Ed, RN,BSN [facility] Liaison [phone]

## 2015-12-01 NOTE — Progress Notes (Signed)
CARDIAC REHAB PHASE I   Cardiac surgery discharge education completed with pt and wife at bedside. Reviewed risk factors, IS, sternal precautions, activity progression, exercise guidelines, daily weights, sodium restrictions, heart healthy diet, carb counting and phase 2 cardiac rehab. Pt and wife verbalized understanding. Pt agrees to phase 2 cardiac rehab, will send to [facility] per pt request. Pt in recliner, call bell within reach, eager for discharge.   [phone] Joylene Grapes, RN, BSN 12/01/2015 11:36 AM

## 2015-12-01 NOTE — Progress Notes (Addendum)
[address]       [place] [address]             [phone]      6 Days Post-Op Procedure(s) (LRB): CORONARY ARTERY BYPASS GRAFTING (CABG)x4 LIMA-LAD; SVG-OM; SVG-RCA; SVG-DIAG (N/A) AORTIC VALVE REPLACEMENT (AVR) USING A MAGNA EASE  (N/A) TRANSESOPHAGEAL ECHOCARDIOGRAM (TEE) (N/A) Subjective: Feels well   Objective: Vital signs in last 24 hours: Temp:  [98 F (36.7 C)-98.3 F (36.8 C)] 98.3 F (36.8 C) (03/27 0331) Pulse Rate:  [60-80] 60 (03/27 0331) Cardiac Rhythm:  [-] Normal sinus rhythm (03/27 0800) Resp:  [16-20] 16 (03/27 0331) BP: (102-126)/(66-82) 104/71 mmHg (03/27 0331) SpO2:  [95 %-99 %] 95 % (03/27 0331) Weight:  [223 lb 12.8 oz (101.515 kg)] 223 lb 12.8 oz (101.515 kg) (03/27 0331)  Hemodynamic parameters for last 24 hours:    Intake/Output from previous day: 03/26 0701 - 03/27 0700 In: 960 [P.O.:960] Out: 1855 [Urine:1850; Stool:5] Intake/Output this shift:    General appearance: alert, cooperative and no distress Heart: regular rate and rhythm Lungs: dim in bases Abdomen: benign Extremities: + edema Wound: incis healing well  Lab Results:  Recent Labs  11/29/15 0423 11/30/15 0756  WBC 13.4* 13.2*  HGB 7.8* 8.2*  HCT 24.1* 25.4*  PLT 279 361   BMET:  Recent Labs  11/30/15 0756 12/01/15 0204  NA 135 133*  K 3.7 4.3  CL 103 102  CO2 21* 21*  GLUCOSE 109* 120*  BUN 35* 36*  CREATININE 1.46* 1.63*  CALCIUM 8.9 8.8*    PT/INR: No results for input(s): LABPROT, INR in the last 72 hours. ABG    Component Value Date/Time   PHART 7.370 11/25/2015 2357   HCO3 25.4* 11/25/2015 2357   TCO2 23 11/26/2015 1734   ACIDBASEDEF 1.0 11/25/2015 1723   O2SAT 92.0 11/25/2015 2357   CBG (last 3)   Recent Labs  11/30/15 1606 11/30/15 2101 12/01/15 0613  GLUCAP 107* 98 105*    Meds Scheduled Meds: . allopurinol  300 mg Oral Daily  . amiodarone  400 mg Oral BID  . aspirin EC  325 mg Oral Daily  .  atorvastatin  40 mg Oral q1800  . famotidine  20 mg Oral BID  . ferrous sulfate  325 mg Oral Q lunch  . folic acid  1 mg Oral Daily  . furosemide  40 mg Oral Daily  . insulin aspart  0-24 Units Subcutaneous TID AC & HS  . insulin detemir  10 Units Subcutaneous Daily  . lisinopril  20 mg Oral Daily  . metoprolol tartrate  25 mg Oral BID  . potassium chloride  30 mEq Oral BID  . potassium chloride  20 mEq Oral Daily  . sodium chloride flush  3 mL Intravenous Q12H   Continuous Infusions:  PRN Meds:.sodium chloride, acetaminophen, hydrALAZINE, ondansetron **OR** ondansetron (ZOFRAN) IV, sodium chloride flush, traMADol  Xrays Dg Chest 2 View  12/01/2015  CLINICAL DATA:  Followup cardiac surgery in pneumothorax. EXAM: CHEST  2 VIEW COMPARISON:  11/28/2015 FINDINGS: Venous access sheath is been removed. Previous median sternotomy, CABG and aortic valve replacement. No pneumothorax. No pleural fluid. Lungs well aerated. Persistent cardiomegaly and perhaps mild venous hypertension. IMPRESSION: Good postoperative appearance. No pneumothorax, collapse or effusion. Electronically Signed   By: Paulina Fusi M.D.   On: 12/01/2015 07:33    Assessment/Plan: S/P Procedure(s) (LRB): CORONARY ARTERY BYPASS GRAFTING (CABG)x4 LIMA-LAD; SVG-OM; SVG-RCA; SVG-DIAG (N/A) AORTIC  VALVE REPLACEMENT (AVR) USING A MAGNA EASE  (N/A) TRANSESOPHAGEAL ECHOCARDIOGRAM (TEE) (N/A) Plan for discharge: see discharge orders stop amio for increased QTc,  Lisinopril stopped for lower BP- creat bumped a little  LOS: 12 days    GOLD,WAYNE E 12/01/2015   Chart reviewed, patient examined, agree with above. QTc 530 so will stop amiodarone and continue Lopressor. His creat jumped a little to 1.6 and BP lower so will hold off on lisinopril until I see him back in the office. He still has significant volume overload with weight 16 lbs over preop. Legs are more swollen than yesterday and reports some pain in right leg. Will  check venous dopplers on right leg since it is more swollen than the left.  I don't think he can go home until volume status corrected. His creat bumped today so he may be more difficult to diurese.

## 2015-12-02 LAB — BASIC METABOLIC PANEL
Anion gap: 9 (ref 5–15)
BUN: 30 mg/dL — ABNORMAL HIGH (ref 6–20)
CALCIUM: 9.2 mg/dL (ref 8.9–10.3)
CHLORIDE: 105 mmol/L (ref 101–111)
CO2: 20 mmol/L — ABNORMAL LOW (ref 22–32)
CREATININE: 1.44 mg/dL — AB (ref 0.61–1.24)
GFR, EST NON AFRICAN AMERICAN: 53 mL/min — AB (ref >60)
Glucose, Bld: 111 mg/dL — ABNORMAL HIGH (ref 65–99)
Potassium: 4.6 mmol/L (ref 3.5–5.1)
SODIUM: 134 mmol/L — AB (ref 135–145)

## 2015-12-02 LAB — GLUCOSE, CAPILLARY
GLUCOSE-CAPILLARY: 104 mg/dL — AB (ref 65–99)
GLUCOSE-CAPILLARY: 101 mg/dL — AB (ref 65–99)
Glucose-Capillary: 108 mg/dL — ABNORMAL HIGH (ref 65–99)
Glucose-Capillary: 112 mg/dL — ABNORMAL HIGH (ref 65–99)

## 2015-12-02 MED ORDER — FUROSEMIDE 10 MG/ML IJ SOLN
40.0000 mg | Freq: Once | INTRAMUSCULAR | Status: AC
  Administered 2015-12-02: 40 mg via INTRAVENOUS
  Filled 2015-12-02 (×2): qty 4

## 2015-12-02 MED ORDER — POTASSIUM CHLORIDE CRYS ER 20 MEQ PO TBCR
40.0000 meq | EXTENDED_RELEASE_TABLET | Freq: Once | ORAL | Status: AC
  Administered 2015-12-02: 40 meq via ORAL
  Filled 2015-12-02: qty 2

## 2015-12-02 MED ORDER — COLCHICINE 0.6 MG PO TABS
0.6000 mg | ORAL_TABLET | Freq: Two times a day (BID) | ORAL | Status: DC
  Administered 2015-12-02 – 2015-12-04 (×5): 0.6 mg via ORAL
  Filled 2015-12-02 (×5): qty 1

## 2015-12-02 MED ORDER — FUROSEMIDE 10 MG/ML IJ SOLN
40.0000 mg | Freq: Once | INTRAMUSCULAR | Status: AC
  Administered 2015-12-02: 40 mg via INTRAVENOUS
  Filled 2015-12-02: qty 4

## 2015-12-02 NOTE — Progress Notes (Addendum)
[address]       [place] [address]             [phone]      7 Days Post-Op Procedure(s) (LRB): CORONARY ARTERY BYPASS GRAFTING (CABG)x4 LIMA-LAD; SVG-OM; SVG-RCA; SVG-DIAG (N/A) AORTIC VALVE REPLACEMENT (AVR) USING A MAGNA EASE  (N/A) TRANSESOPHAGEAL ECHOCARDIOGRAM (TEE) (N/A) Subjective: Feels ok except legs are painful, has h/o gout  Objective: Vital signs in last 24 hours: Temp:  [98.2 F (36.8 C)-98.9 F (37.2 C)] 98.2 F (36.8 C) (03/28 0502) Pulse Rate:  [62-67] 67 (03/28 0502) Cardiac Rhythm:  [-] Normal sinus rhythm (03/27 1900) Resp:  [16-18] 18 (03/28 0502) BP: (126-130)/(76-90) 126/90 mmHg (03/28 0502) SpO2:  [97 %-100 %] 97 % (03/28 0502) Weight:  [221 lb 9.6 oz (100.517 kg)] 221 lb 9.6 oz (100.517 kg) (03/28 0502)  Hemodynamic parameters for last 24 hours:    Intake/Output from previous day: 03/27 0701 - 03/28 0700 In: 960 [P.O.:960] Out: 3250 [Urine:3250] Intake/Output this shift:    General appearance: alert, cooperative and no distress Heart: regular rate and rhythm and 2/6 syst murmur Lungs: dim in left base Abdomen: benign Extremities: + edema Wound: incis healing well  Lab Results:  Recent Labs  11/30/15 0756  WBC 13.2*  HGB 8.2*  HCT 25.4*  PLT 361   BMET:  Recent Labs  12/01/15 0204 12/02/15 0331  NA 133* 134*  K 4.3 4.6  CL 102 105  CO2 21* 20*  GLUCOSE 120* 111*  BUN 36* 30*  CREATININE 1.63* 1.44*  CALCIUM 8.8* 9.2    PT/INR: No results for input(s): LABPROT, INR in the last 72 hours. ABG    Component Value Date/Time   PHART 7.370 11/25/2015 2357   HCO3 25.4* 11/25/2015 2357   TCO2 23 11/26/2015 1734   ACIDBASEDEF 1.0 11/25/2015 1723   O2SAT 92.0 11/25/2015 2357   CBG (last 3)   Recent Labs  12/01/15 1638 12/01/15 2150 12/02/15 0620  GLUCAP 121* 109* 104*    Meds Scheduled Meds: . allopurinol  300 mg Oral Daily  . aspirin EC  325 mg Oral Daily  . atorvastatin  40 mg  Oral q1800  . enoxaparin (LOVENOX) injection  40 mg Subcutaneous Q24H  . famotidine  20 mg Oral BID  . ferrous sulfate  325 mg Oral Q lunch  . folic acid  1 mg Oral Daily  . furosemide  40 mg Intravenous Once  . furosemide  40 mg Oral Daily  . insulin aspart  0-24 Units Subcutaneous TID AC & HS  . insulin detemir  10 Units Subcutaneous Daily  . metoprolol tartrate  25 mg Oral BID  . sodium chloride flush  3 mL Intravenous Q12H   Continuous Infusions:  PRN Meds:.sodium chloride, acetaminophen, ondansetron **OR** ondansetron (ZOFRAN) IV, sodium chloride flush, traMADol  Xrays Dg Chest 2 View  12/01/2015  CLINICAL DATA:  Followup cardiac surgery in pneumothorax. EXAM: CHEST  2 VIEW COMPARISON:  11/28/2015 FINDINGS: Venous access sheath is been removed. Previous median sternotomy, CABG and aortic valve replacement. No pneumothorax. No pleural fluid. Lungs well aerated. Persistent cardiomegaly and perhaps mild venous hypertension. IMPRESSION: Good postoperative appearance. No pneumothorax, collapse or effusion. Electronically Signed   By: Paulina Fusi M.D.   On: 12/01/2015 07:33    Assessment/Plan: S/P Procedure(s) (LRB): CORONARY ARTERY BYPASS GRAFTING (CABG)x4 LIMA-LAD; SVG-OM; SVG-RCA; SVG-DIAG (N/A) AORTIC VALVE REPLACEMENT (AVR) USING A MAGNA EASE  (N/A) TRANSESOPHAGEAL ECHOCARDIOGRAM (TEE) (  N/A)  1 pain is probable gout related, no right DVT on duplex- restart colchicine- on allopurinol 2 creat improved, cont to diurese gently 3 in sinus with amio stopped for now 4 BP control is good- no ace inhib  for now 5 hold on d/c for now  LOS: 13 days    GOLD,WAYNE E 12/02/2015   Chart reviewed, patient examined, agree with above. Diuresed well yesterday, -2200 cc. Weight down 2 lbs but still 12 lbs over preop. He still has significant LE edema and needs to stay for IV diuresis until close to preop weight. LE venous dopplers negative. He is still having pain in right leg which I  think is surgical . He is back on colchicine for possible gout but his pain does not sound like gout to me.

## 2015-12-03 LAB — BASIC METABOLIC PANEL
Anion gap: 10 (ref 5–15)
BUN: 29 mg/dL — ABNORMAL HIGH (ref 6–20)
CO2: 23 mmol/L (ref 22–32)
Calcium: 9.2 mg/dL (ref 8.9–10.3)
Chloride: 101 mmol/L (ref 101–111)
Creatinine, Ser: 1.47 mg/dL — ABNORMAL HIGH (ref 0.61–1.24)
GFR calc non Af Amer: 52 mL/min — ABNORMAL LOW (ref >60)
GFR, EST AFRICAN AMERICAN: 60 mL/min — AB (ref >60)
GLUCOSE: 102 mg/dL — AB (ref 65–99)
POTASSIUM: 4.8 mmol/L (ref 3.5–5.1)
Sodium: 134 mmol/L — ABNORMAL LOW (ref 135–145)

## 2015-12-03 LAB — GLUCOSE, CAPILLARY
GLUCOSE-CAPILLARY: 89 mg/dL (ref 65–99)
GLUCOSE-CAPILLARY: 122 mg/dL — AB (ref 65–99)
GLUCOSE-CAPILLARY: 111 mg/dL — AB (ref 65–99)
Glucose-Capillary: 92 mg/dL (ref 65–99)

## 2015-12-03 MED ORDER — FUROSEMIDE 10 MG/ML IJ SOLN
40.0000 mg | Freq: Once | INTRAMUSCULAR | Status: AC
  Administered 2015-12-03: 40 mg via INTRAVENOUS
  Filled 2015-12-03: qty 4

## 2015-12-03 NOTE — Progress Notes (Addendum)
[address]       [place] [address]             [phone]      8 Days Post-Op Procedure(s) (LRB): CORONARY ARTERY BYPASS GRAFTING (CABG)x4 LIMA-LAD; SVG-OM; SVG-RCA; SVG-DIAG (N/A) AORTIC VALVE REPLACEMENT (AVR) USING A MAGNA EASE  (N/A) TRANSESOPHAGEAL ECHOCARDIOGRAM (TEE) (N/A) Subjective: C/O right>left lower leg pain, a little bit improved but still significant and impairing recovery  Objective: Vital signs in last 24 hours: Temp:  [97.8 F (36.6 C)-98.2 F (36.8 C)] 98.2 F (36.8 C) (03/29 0340) Pulse Rate:  [62-66] 62 (03/29 0340) Cardiac Rhythm:  [-] Normal sinus rhythm (03/28 1940) Resp:  [18] 18 (03/29 0340) BP: (127-167)/(89-96) 127/89 mmHg (03/29 0340) SpO2:  [99 %-100 %] 99 % (03/29 0340) Weight:  [216 lb 12.8 oz (98.34 kg)] 216 lb 12.8 oz (98.34 kg) (03/29 0340)  Hemodynamic parameters for last 24 hours:    Intake/Output from previous day: 03/28 0701 - 03/29 0700 In: 600 [P.O.:600] Out: 2000 [Urine:2000] Intake/Output this shift:    General appearance: alert, cooperative and no distress Heart: regular rate and rhythm Lungs: clear to auscultation bilaterally Abdomen: benign Extremities: + LE edema, somewhat improved, +tender to touch Wound: incis heling well  Lab Results: No results for input(s): WBC, HGB, HCT, PLT in the last 72 hours. BMET:  Recent Labs  12/02/15 0331 12/03/15 0310  NA 134* 134*  K 4.6 4.8  CL 105 101  CO2 20* 23  GLUCOSE 111* 102*  BUN 30* 29*  CREATININE 1.44* 1.47*  CALCIUM 9.2 9.2    PT/INR: No results for input(s): LABPROT, INR in the last 72 hours. ABG    Component Value Date/Time   PHART 7.370 11/25/2015 2357   HCO3 25.4* 11/25/2015 2357   TCO2 23 11/26/2015 1734   ACIDBASEDEF 1.0 11/25/2015 1723   O2SAT 92.0 11/25/2015 2357   CBG (last 3)   Recent Labs  12/02/15 1640 12/02/15 2055 12/03/15 0642  GLUCAP 108* 112* 92    Meds Scheduled Meds: . allopurinol  300 mg Oral  Daily  . aspirin EC  325 mg Oral Daily  . atorvastatin  40 mg Oral q1800  . colchicine  0.6 mg Oral BID  . enoxaparin (LOVENOX) injection  40 mg Subcutaneous Q24H  . famotidine  20 mg Oral BID  . ferrous sulfate  325 mg Oral Q lunch  . folic acid  1 mg Oral Daily  . furosemide  40 mg Oral Daily  . insulin aspart  0-24 Units Subcutaneous TID AC & HS  . insulin detemir  10 Units Subcutaneous Daily  . metoprolol tartrate  25 mg Oral BID  . sodium chloride flush  3 mL Intravenous Q12H   Continuous Infusions:  PRN Meds:.sodium chloride, acetaminophen, ondansetron **OR** ondansetron (ZOFRAN) IV, sodium chloride flush, traMADol  Xrays No results found.  Assessment/Plan: S/P Procedure(s) (LRB): CORONARY ARTERY BYPASS GRAFTING (CABG)x4 LIMA-LAD; SVG-OM; SVG-RCA; SVG-DIAG (N/A) AORTIC VALVE REPLACEMENT (AVR) USING A MAGNA EASE  (N/A) TRANSESOPHAGEAL ECHOCARDIOGRAM (TEE) (N/A)  1 diuresing well, but LE exam remains painful and limiting ambulation. Usual gout for him is knees and ankles. On allopurinol and colchicine in case gout is component of current problem. Renal function stable but unfortunately not a good candidate for NSAIDS which I think would make an impact on pain.     LOS: 14 days    GOLD,[name] 12/03/2015   Chart reviewed, patient examined, agree with  above. He is walking around well this afternoon. He continues to diurese and legs much less swollen. He should be able to go home tomorrow.

## 2015-12-04 LAB — GLUCOSE, CAPILLARY: Glucose-Capillary: 85 mg/dL (ref 65–99)

## 2015-12-04 MED ORDER — LISINOPRIL 10 MG PO TABS: 10.0000 mg | ORAL_TABLET | Freq: Every day | ORAL | Status: DC

## 2015-12-04 MED ORDER — OXYCODONE-ACETAMINOPHEN 5-325 MG PO TABS: 1.0000 | ORAL_TABLET | Freq: Four times a day (QID) | ORAL | Status: DC | PRN

## 2015-12-04 MED ORDER — PROCHLORPERAZINE EDISYLATE 5 MG/ML IJ SOLN: 5.0000 mg | INTRAMUSCULAR | Status: DC | PRN

## 2015-12-04 MED ORDER — METFORMIN HCL 500 MG PO TABS: 500.0000 mg | ORAL_TABLET | Freq: Two times a day (BID) | ORAL | Status: DC

## 2015-12-04 MED ORDER — AMLODIPINE BESYLATE 5 MG PO TABS
5.0000 mg | ORAL_TABLET | Freq: Every day | ORAL | Status: DC
  Administered 2015-12-04: 5 mg via ORAL
  Filled 2015-12-04: qty 1

## 2015-12-04 MED ORDER — LISINOPRIL 5 MG PO TABS: 5.0000 mg | ORAL_TABLET | Freq: Every day | ORAL | Status: DC

## 2015-12-04 MED ORDER — AMLODIPINE BESYLATE 5 MG PO TABS: 5.0000 mg | ORAL_TABLET | Freq: Every day | ORAL | Status: DC

## 2015-12-04 NOTE — Progress Notes (Addendum)
      [address]       [place] [address]             [phone]      9 Days Post-Op Procedure(s) (LRB): CORONARY ARTERY BYPASS GRAFTING (CABG)x4 LIMA-LAD; SVG-OM; SVG-RCA; SVG-DIAG (N/A) AORTIC VALVE REPLACEMENT (AVR) USING A MAGNA EASE  (N/A) TRANSESOPHAGEAL ECHOCARDIOGRAM (TEE) (N/A)   Subjective:  [name] has no new complaints.  His leg pain is better and he is ambulating around the hallways without much difficulty.  Objective: Vital signs in last 24 hours: Temp:  [98.1 F (36.7 C)-98.3 F (36.8 C)] 98.3 F (36.8 C) (03/30 0458) Pulse Rate:  [64-67] 64 (03/30 0458) Cardiac Rhythm:  [-] Normal sinus rhythm (03/30 0300) Resp:  [18] 18 (03/30 0458) BP: (139-154)/(60-83) 154/60 mmHg (03/30 0458) SpO2:  [100 %] 100 % (03/30 0458) Weight:  [212 lb 8 oz (96.389 kg)] 212 lb 8 oz (96.389 kg) (03/30 0458)  Intake/Output from previous day: 03/29 0701 - 03/30 0700 In: 480 [P.O.:480] Out: 2450 [Urine:2450]  General appearance: alert, cooperative and no distress Heart: regular rate and rhythm Lungs: clear to auscultation bilaterally Abdomen: soft, non-tender; bowel sounds normal; no masses,  no organomegaly Extremities: edema trace, tender to touch R>L Wound: clean and dry  Lab Results: No results for input(s): WBC, HGB, HCT, PLT in the last 72 hours. BMET:  Recent Labs  12/02/15 0331 12/03/15 0310  NA 134* 134*  K 4.6 4.8  CL 105 101  CO2 20* 23  GLUCOSE 111* 102*  BUN 30* 29*  CREATININE 1.44* 1.47*  CALCIUM 9.2 9.2    PT/INR: No results for input(s): LABPROT, INR in the last 72 hours. ABG    Component Value Date/Time   PHART 7.370 11/25/2015 2357   HCO3 25.4* 11/25/2015 2357   TCO2 23 11/26/2015 1734   ACIDBASEDEF 1.0 11/25/2015 1723   O2SAT 92.0 11/25/2015 2357   CBG (last 3)   Recent Labs  12/03/15 1643 12/03/15 2100 12/04/15 0634  GLUCAP 122* 111* 85    Assessment/Plan: S/P Procedure(s) (LRB): CORONARY ARTERY  BYPASS GRAFTING (CABG)x4 LIMA-LAD; SVG-OM; SVG-RCA; SVG-DIAG (N/A) AORTIC VALVE REPLACEMENT (AVR) USING A MAGNA EASE  (N/A) TRANSESOPHAGEAL ECHOCARDIOGRAM (TEE) (N/A)  1. CV- Sinus [name] - continue Lopressor, + HTN- was on ACE preop but creatinine is elevated at 1.47, could restart at reduced dose vs. Starting Norvasc will discuss with Dr. Laneta Simmers 2. Pulm- no acute issues, continue IS 3. Renal-mild creatinine elevation, weight trending down, continue Lasix 4. DM- cbgs controlled, will resume home metformin 5. Dispo- patient stable, will need agent for BP control will discuss with Dr. Laneta Simmers, home today   LOS: 15 days    [name], [name] 12/04/2015

## 2015-12-04 NOTE — Discharge Instructions (Signed)
MOUTH CARE AFTER SURGERY  FACTS:  Ice used in ice bag helps keep the swelling down, and can help lessen the pain.  It is easier to treat pain BEFORE it happens.  Spitting disturbs the clot and may cause bleeding to start again, or to get worse.  Smoking delays healing and can cause complications.  Sharing prescriptions can be dangerous.  Do not take medications not recently prescribed for you.  Antibiotics may stop birth control pills from working.  Use other means of birth control while on antibiotics.  Warm salt water rinses after the first 24 hours will help lessen the swelling:  Use 1/2 teaspoonful of table salt per 8-12 oz.of water.  DO NOT:  Do not spit.  Do not drink through a straw.  Strongly advised not to smoke, dip snuff or chew tobacco at least for 3 days.  Do not eat sharp or crunchy foods.  Avoid the area of surgery when chewing.  Do not stop your antibiotics before your instructions say to do so.  Do not eat hot foods until bleeding has stopped.  If you need to, let your food cool down to room temperature.  EXPECT:  Some swelling, especially first 2-3 days.  Soreness or discomfort in varying degrees.  Follow your dentist's instructions about how to handle pain before it starts.  Pinkish saliva or light blood in saliva, or on your pillow in the morning.  This can last around 24 hours.  Bruising inside or outside the mouth.  This may not show up until 2-3 days after surgery.  Don't worry, it will go away in time.  Pieces of "bone" may work themselves loose.  It's OK.  If they bother you, let us know.  WHAT TO DO IMMEDIATELY AFTER SURGERY:  Bite on the gauze with steady pressure for 1-2 hours.  Don't chew on the gauze.  Do not lie down flat.  Raise your head support especially for the first 24 hours.  Apply ice to your face on the side of the surgery.  You may apply it 20 minutes on and a few minutes off.  Ice for 8-12 hours.  You may use ice up to 24  hours.  Before the numbness wears off, take a pain pill as instructed.  Prescription pain medication is not always required.  SWELLING:  Expect swelling for the first couple of days.  It should get better after that.  If swelling increases 3 days or so after surgery; let us know as soon as possible.  FEVER:  Take Tylenol every 4 hours if needed to lower your temperature, especially if it is at 100F or higher.  Drink lots of fluids.  If the fever does not go away, let us know.  BREATHING TROUBLE:  Any unusual difficulty breathing means you have to have someone bring you to the emergency room ASAP  BLEEDING:  Light oozing is expected for 24 hours or so.  Prop head up with pillows  Avoid spitting  Do not confuse bright red fresh flowing blood with lots of saliva colored with a little bit of blood.  If you notice some bleeding, place gauze or a tea bag where it is bleeding and apply CONSTANT pressure by biting down for 1 hour.  Avoid talking during this time.  Do not remove the gauze or tea bag during this hour to "check" the bleeding.  If you notice bright RED bleeding FLOWING out of particular area, and filling the floor of your mouth,  put a wad of gauze on that area, bite down firmly and constantly.  Call us immediately.  If we're closed, have someone bring you to the emergency room.  ORAL HYGIENE:  Brush your teeth as usual after meals and before bedtime.  Use a soft toothbrush around the area of surgery.  DO NOT AVOID BRUSHING.  Otherwise bacteria(germs) will grow and may delay healing or encourage infection.  Since you cannot spit, just gently rinse and let the water flow out of your mouth.  DO NOT SWISH HARD.  EATING:  Cool liquids are a good point to start.  Increase to soft foods as tolerated.  PRESCRIPTIONS:  Follow the directions for your prescriptions exactly as written.  If Dr. Kristin Bruins gave you a narcotic pain medication, do not drive, operate  machinery or drink alcohol when on that medication.  QUESTIONS: Call our office during office hours [phone] or call the Emergency Room at [phone].   Endoscopic Saphenous Vein Harvesting, Care After Refer to this sheet in the next few weeks. These instructions provide you with information on caring for yourself after your procedure. Your health care provider may also give you more specific instructions. Your treatment has been planned according to current medical practices, but problems sometimes occur. Call your health care provider if you have any problems or questions after your procedure. HOME CARE INSTRUCTIONS Medicine Take whatever pain medicine your surgeon prescribes. Follow the directions carefully. Do not take over-the-counter pain medicine unless your surgeon says it is okay. Some pain medicine can cause bleeding problems for several weeks after surgery. Follow your surgeon's instructions about driving. You will probably not be permitted to drive after heart surgery. Take any medicines your surgeon prescribes. Any medicines you took before your heart surgery should be checked with your health care provider before you start taking them again. Wound care If your surgeon has prescribed an elastic bandage or stocking, ask how long you should wear it. Check the area around your surgical cuts (incisions) whenever your bandages (dressings) are changed. Look for any redness or swelling. You will need to return to have the stitches (sutures) or staples taken out. Ask your surgeon when to do that. Ask your surgeon when you can shower or bathe. Activity Try to keep your legs raised when you are sitting. Do any exercises your health care providers have given you. These may include deep breathing exercises, coughing, walking, or other exercises. SEEK MEDICAL CARE IF: You have any questions about your medicines. You have more leg pain, especially if your pain medicine stops working. New  or growing bruises develop on your leg. Your leg swells, feels tight, or becomes red. You have numbness in your leg. SEEK IMMEDIATE MEDICAL CARE IF: Your pain gets much worse. Blood or fluid leaks from any of the incisions. Your incisions become warm, swollen, or red. You have chest pain. You have trouble breathing. You have a fever. You have more pain near your leg incision. MAKE SURE YOU: Understand these instructions. Will watch your condition. Will get help right away if you are not doing well or get worse.   This information is not intended to replace advice given to you by your health care provider. Make sure you discuss any questions you have with your health care provider.   Document Released: 05/05/2011 Document Revised: 09/13/2014 Document Reviewed: 05/05/2011 Elsevier Interactive Patient Education 2016 Else[employer]. Aortic Valve Replacement, Care After Refer to this sheet in the next few weeks. These instructions provide you  with information on caring for yourself after your procedure. Your health care provider may also give you specific instructions. Your treatment has been planned according to current medical practices, but problems sometimes occur. Call your health care provider if you have any problems or questions after your procedure. HOME CARE INSTRUCTIONS  Take medicines only as directed by your health care provider. If your health care provider has prescribed elastic stockings, wear them as directed. Take frequent naps or rest often throughout the day. Avoid lifting over 10 lbs (4.5 kg) or pushing or pulling things with your arms for 6-8 weeks or as directed by your health care provider. Avoid driving or airplane travel for 4-6 weeks after surgery or as directed by your health care provider. If you are riding in a car for an extended period, stop every 1-2 hours to stretch your legs. Keep a record of your medicines and medical history with you when traveling. Do not  drive or operate heavy machinery while taking pain medicine. (narcotics). Do not cross your legs. Do not use any tobacco products including cigarettes, chewing tobacco, or electronic cigarettes. If you need help quitting, ask your health care provider. Do not take baths, swim, or use a hot tub until your health care provider approves. Take showers once your health care provider approves. Pat incisions dry. Do not rub incisions with a washcloth or towel. Avoid climbing stairs and using the handrail to pull yourself up for the first 2-3 weeks after surgery. Return to work as directed by your health care provider. Drink enough fluid to keep your urine clear or pale yellow. Do not strain to have a bowel movement. Eat high-fiber foods if you become constipated. You may also take a medicine to help you have a bowel movement (laxative) as directed by your health care provider. Resume sexual activity as directed by your health care provider. Men should not use medicines for erectile dysfunction until their doctor says it isokay. If you had a certain type of heart condition in the past, you may need to take antibiotic medicine before having dental work or surgery. Let your dentist and health care providers know if you had one or more of the following: Previous endocarditis. An artificial (prosthetic) heart valve. Congenital heart disease. SEEK MEDICAL CARE IF: You develop a skin rash.  You experience sudden changes in your weight. You have a fever. SEEK IMMEDIATE MEDICAL CARE IF:  You develop chest pain that is not coming from your incision. You have drainage (pus), redness, swelling, or pain at your incision site.  You develop shortness of breath or have difficulty breathing.  You have increased bleeding from your incision site.  You develop light-headedness.  MAKE SURE YOU:  Understand these directions. Will watch your condition. Will get help right away if you are not doing well or get  worse.   This information is not intended to replace advice given to you by your health care provider. Make sure you discuss any questions you have with your health care provider.   Document Released: 03/11/2005 Document Revised: 09/13/2014 Document Reviewed: 06/06/2012 Elsevier Interactive Patient Education 2016 Elsevier Inc. Coronary Artery Bypass Grafting, Care After These instructions give you information on caring for yourself after your procedure. Your doctor may also give you more specific instructions. Call your doctor if you have any problems or questions after your procedure.  HOME CARE Only take medicine as told by your doctor. Take medicines exactly as told. Do not stop taking medicines or start  any new medicines without talking to your doctor first. Take your pulse as told by your doctor. Do deep breathing as told by your doctor. Use your breathing device (incentive spirometer), if given, to practice deep breathing several times a day. Support your chest with a pillow or your arms when you take deep breaths or cough. Keep the area clean, dry, and protected where the surgery cuts (incisions) were made. Remove bandages (dressings) only as told by your doctor. If strips were applied to surgical area, do not take them off. They fall off on their own. Check the surgery area daily for puffiness (swelling), redness, or leaking fluid. If surgery cuts were made in your legs: Avoid crossing your legs. Avoid sitting for long periods of time. Change positions every 30 minutes. Raise your legs when you are sitting. Place them on pillows. Wear stockings that help keep blood clots from forming in your legs (compression stockings). Only take sponge baths until your doctor says it is okay to take showers. Pat the surgery area dry. Do not rub the surgery area with a washcloth or towel. Do not bathe, swim, or use a hot tub until your doctor says it is okay. Eat foods that are high in fiber. These  include raw fruits and vegetables, whole grains, beans, and nuts. Choose lean meats. Avoid canned, processed, and fried foods. Drink enough fluids to keep your pee (urine) clear or pale yellow. Weigh yourself every day. Rest and limit activity as told by your doctor. You may be told to: Stop any activity if you have chest pain, shortness of breath, changes in heartbeat, or dizziness. Get help right away if this happens. Move around often for short amounts of time or take short walks as told by your doctor. Gradually become more active. You may need help to strengthen your muscles and build endurance. Avoid lifting, pushing, or pulling anything heavier than 10 pounds (4.5 kg) for at least 6 weeks after surgery. Do not drive until your doctor says it is okay. Ask your doctor when you can go back to work. Ask your doctor when you can begin sexual activity again. Follow up with your doctor as told. GET HELP IF: You have puffiness, redness, more pain, or fluid draining from the incision site. You have a fever. You have puffiness in your ankles or legs. You have pain in your legs. You gain 2 or more pounds (0.9 kg) a day. You feel sick to your stomach (nauseous) or throw up (vomit). You have watery poop (diarrhea). GET HELP RIGHT AWAY IF: You have chest pain that goes to your jaw or arms. You have shortness of breath. You have a fast or irregular heartbeat. You notice a "clicking" in your breastbone when you move. You have numbness or weakness in your arms or legs. You feel dizzy or light-headed. MAKE SURE YOU: Understand these instructions. Will watch your condition. Will get help right away if you are not doing well or get worse.   This information is not intended to replace advice given to you by your health care provider. Make sure you discuss any questions you have with your health care provider.   Document Released: 08/28/2013 Document Reviewed: 08/28/2013 Elsevier Interactive  Patient Education [employer]

## 2015-12-04 NOTE — Progress Notes (Signed)
Order received to discharge.  Telemetry removed and CCMD notified.  IV removed with catheter intact.  Discharge education given to Pt with wife at bedside.  All questions answered.  Pt denies chest pain, right leg pain or sob.  Pt stable at discharge.

## 2015-12-04 NOTE — Progress Notes (Signed)
Patient ambulated 400 feet on room air using walking cane. Wife walked with patient. Patient tolerated well.  RN will continue to monitor patient

## 2015-12-04 NOTE — Care Management Important Message (Signed)
Important Message  Patient Details  Name: [name] MRN: [id number] Date of Birth: [birth year]   Medicare Important Message Given:  Yes    Lakeishia Truluck Abena 12/04/2015, 1:42 PM

## 2015-12-05 ENCOUNTER — Ambulatory Visit: Payer: Commercial Managed Care - HMO

## 2015-12-05 ENCOUNTER — Other Ambulatory Visit: Payer: Self-pay | Admitting: *Deleted

## 2015-12-05 NOTE — Patient Outreach (Signed)
Referral received from hospital liaison to initiate transition of care program once member discharged from hospital.  Member recently admitted for shortness of breath, status post coronary artery bypass grafting (CABG).  Discharged yesterday, 3/30.  According to chart, member also has history of heart failure, diabetes, and hypertension.  Call placed to member, no answer.  HIPPA complaint voice message left.  Will await call back, will make second attempt to contact on Monday.  Kemper Durie, BSN, [facility] Care Management  [facility] Care Manager [phone]

## 2015-12-08 ENCOUNTER — Other Ambulatory Visit: Payer: Self-pay | Admitting: *Deleted

## 2015-12-08 NOTE — Patient Outreach (Signed)
Second attempt made to contact member without success, HIPPA compliant voice message left, will await call back.  Kemper Durie, BSN, [facility] Care Management  [facility] Care Manager [phone]

## 2015-12-10 ENCOUNTER — Other Ambulatory Visit: Payer: Self-pay | Admitting: *Deleted

## 2015-12-10 ENCOUNTER — Telehealth: Payer: Self-pay | Admitting: *Deleted

## 2015-12-10 ENCOUNTER — Encounter (HOSPITAL_COMMUNITY): Payer: Self-pay

## 2015-12-10 ENCOUNTER — Inpatient Hospital Stay (HOSPITAL_COMMUNITY)
Admission: EM | Admit: 2015-12-10 | Discharge: 2015-12-12 | DRG: 066 | Disposition: A | Discharge status: Home / Self Care | Payer: Commercial Managed Care - HMO | Attending: Oncology | Admitting: Oncology

## 2015-12-10 DIAGNOSIS — R208 Other disturbances of skin sensation: Secondary | ICD-10-CM | POA: Diagnosis present

## 2015-12-10 DIAGNOSIS — I10 Essential (primary) hypertension: Secondary | ICD-10-CM | POA: Insufficient documentation

## 2015-12-10 DIAGNOSIS — Z7982 Long term (current) use of aspirin: Secondary | ICD-10-CM

## 2015-12-10 DIAGNOSIS — I639 Cerebral infarction, unspecified: Secondary | ICD-10-CM | POA: Diagnosis not present

## 2015-12-10 DIAGNOSIS — E7800 Pure hypercholesterolemia, unspecified: Secondary | ICD-10-CM | POA: Diagnosis present

## 2015-12-10 DIAGNOSIS — I6603 Occlusion and stenosis of bilateral middle cerebral arteries: Secondary | ICD-10-CM | POA: Diagnosis present

## 2015-12-10 DIAGNOSIS — I6322 Cerebral infarction due to unspecified occlusion or stenosis of basilar arteries: Secondary | ICD-10-CM

## 2015-12-10 DIAGNOSIS — Z91018 Allergy to other foods: Secondary | ICD-10-CM

## 2015-12-10 DIAGNOSIS — Z8249 Family history of ischemic heart disease and other diseases of the circulatory system: Secondary | ICD-10-CM

## 2015-12-10 DIAGNOSIS — I6381 Other cerebral infarction due to occlusion or stenosis of small artery: Secondary | ICD-10-CM | POA: Insufficient documentation

## 2015-12-10 DIAGNOSIS — M1000 Idiopathic gout, unspecified site: Secondary | ICD-10-CM | POA: Diagnosis present

## 2015-12-10 DIAGNOSIS — I2510 Atherosclerotic heart disease of native coronary artery without angina pectoris: Secondary | ICD-10-CM | POA: Insufficient documentation

## 2015-12-10 DIAGNOSIS — K219 Gastro-esophageal reflux disease without esophagitis: Secondary | ICD-10-CM | POA: Diagnosis present

## 2015-12-10 DIAGNOSIS — I671 Cerebral aneurysm, nonruptured: Secondary | ICD-10-CM | POA: Diagnosis present

## 2015-12-10 DIAGNOSIS — E785 Hyperlipidemia, unspecified: Secondary | ICD-10-CM | POA: Diagnosis present

## 2015-12-10 DIAGNOSIS — Z7984 Long term (current) use of oral hypoglycemic drugs: Secondary | ICD-10-CM

## 2015-12-10 DIAGNOSIS — Z953 Presence of xenogenic heart valve: Secondary | ICD-10-CM

## 2015-12-10 DIAGNOSIS — Z89021 Acquired absence of right finger(s): Secondary | ICD-10-CM

## 2015-12-10 DIAGNOSIS — Z79899 Other long term (current) drug therapy: Secondary | ICD-10-CM

## 2015-12-10 DIAGNOSIS — I63219 Cerebral infarction due to unspecified occlusion or stenosis of unspecified vertebral arteries: Secondary | ICD-10-CM | POA: Insufficient documentation

## 2015-12-10 DIAGNOSIS — E119 Type 2 diabetes mellitus without complications: Secondary | ICD-10-CM | POA: Insufficient documentation

## 2015-12-10 DIAGNOSIS — Z885 Allergy status to narcotic agent status: Secondary | ICD-10-CM

## 2015-12-10 DIAGNOSIS — Z951 Presence of aortocoronary bypass graft: Secondary | ICD-10-CM | POA: Insufficient documentation

## 2015-12-10 LAB — DIFFERENTIAL
BASOS ABS: 0 10*3/uL (ref 0.0–0.1)
BASOS PCT: 0 %
EOS ABS: 0.2 10*3/uL (ref 0.0–0.7)
Eosinophils Relative: 2 %
LYMPHS ABS: 2.3 10*3/uL (ref 0.7–4.0)
Lymphocytes Relative: 29 %
Monocytes Absolute: 0.6 10*3/uL (ref 0.1–1.0)
Monocytes Relative: 8 %
NEUTROS ABS: 4.8 10*3/uL (ref 1.7–7.7)
NEUTROS PCT: 61 %

## 2015-12-10 LAB — CBC
HEMATOCRIT: 31.3 % — AB (ref 39.0–52.0)
HEMOGLOBIN: 9.8 g/dL — AB (ref 13.0–17.0)
MCH: 28.5 pg (ref 26.0–34.0)
MCHC: 31.3 g/dL (ref 30.0–36.0)
MCV: 91 fL (ref 78.0–100.0)
Platelets: 447 10*3/uL — ABNORMAL HIGH (ref 150–400)
RBC: 3.44 MIL/uL — ABNORMAL LOW (ref 4.22–5.81)
RDW: 15.5 % (ref 11.5–15.5)
WBC: 8 10*3/uL (ref 4.0–10.5)

## 2015-12-10 LAB — COMPREHENSIVE METABOLIC PANEL
ALBUMIN: 3.7 g/dL (ref 3.5–5.0)
ALT: 9 U/L — AB (ref 17–63)
AST: 28 U/L (ref 15–41)
Alkaline Phosphatase: 109 U/L (ref 38–126)
Anion gap: 13 (ref 5–15)
BILIRUBIN TOTAL: 0.4 mg/dL (ref 0.3–1.2)
BUN: 18 mg/dL (ref 6–20)
CHLORIDE: 105 mmol/L (ref 101–111)
CO2: 20 mmol/L — ABNORMAL LOW (ref 22–32)
CREATININE: 1.24 mg/dL (ref 0.61–1.24)
Calcium: 9.8 mg/dL (ref 8.9–10.3)
GFR calc Af Amer: >60 mL/min (ref >60)
GFR calc non Af Amer: >60 mL/min (ref >60)
GLUCOSE: 143 mg/dL — AB (ref 65–99)
POTASSIUM: 3.8 mmol/L (ref 3.5–5.1)
Sodium: 138 mmol/L (ref 135–145)
Total Protein: 7.7 g/dL (ref 6.5–8.1)

## 2015-12-10 LAB — PROTIME-INR
INR: 1.15 (ref 0.00–1.49)
Prothrombin Time: 14.9 seconds (ref 11.6–15.2)

## 2015-12-10 LAB — APTT: APTT: 34 s (ref 24–37)

## 2015-12-10 LAB — I-STAT TROPONIN, ED: Troponin i, poc: 0.07 ng/mL (ref 0.00–0.08)

## 2015-12-10 NOTE — Patient Outreach (Signed)
Third attempt made to contact member, this time successful.  He states that he is doing "real good."  He denies any pain or discomfort, and shortness of breath.  He reports that he has been able to continue his ADLs independently and makes attempts to increase exercise/activity daily.  He reports that he thought he was told that he would have a nurse come to the home to see him, but state he has not been contacted.  Member made aware that according to discharge summary, no home health was ordered.  He reports that within the next several weeks, he will be participating in cardiac rehab.  Member reports checking weights and blood sugars on a daily basis, states he does keep a record.  He reports a decrease in weight of 5 pounds over the past couple days.    He reports that he has been experiencing numbness and a hot sensation on his right side (face, arm, leg).  He denies difficulty walking, confusion, lightheadedness, difference in strength.  He does not have slurred speech or trouble getting his words out.  Member states he has appointment with cardiology on 4/19, instructed to contact them today to report symptoms and obtain further instructions, possibly earlier appointment.  He verbalizes understanding and state he will call once this call is complete.  He is also instructed to request new prescriptions for medications that he reported are low or empty.    Member denies any further concerns, contact information provided.  Encouraged to contact with questions.  Agrees to initial home visit next week, will continue with transition of care program once home visit complete.  Kemper Durie, BSN, [facility] Care Management  [facility] Care Manager [phone]

## 2015-12-10 NOTE — ED Notes (Signed)
Patient here with right sided numbness and right side feeling hot since Monday. Had bypass surgery 2 weeks ago. denies any trouble ambulating, grips equal, no drift

## 2015-12-10 NOTE — ED Provider Notes (Signed)
CSN: [id number]     Arrival date & time 12/10/15  1506 History   First MD Initiated Contact with Patient 12/10/15 2233     Chief Complaint  Patient presents with  . right sided numbness      The history is provided by the patient.  Patient with a h/o hypertension, diabetes, recent aortic valve replacement, presents with right sided numbness for 2 days.  It has been constant.  Nothing improves his symptoms. It includes right face/arm/leg.  No HA.  No visual changes.  He reports feels some weakness in right arm No dizziness.   No new CP/SOB He reports he had otherwise been well after the surgery.     Past Medical History  Diagnosis Date  . Hypertension   . Hypercholesteremia   . Baker's cyst of knee   . Type II diabetes mellitus (HCC)   . GERD (gastroesophageal reflux disease)   . Gouty arthritis     "real bad" (01/17/2013)   Past Surgical History  Procedure Laterality Date  . Finger amputation Right 1980's    3rd & 4th digits "got them mashed" (01/17/2013)  . Knee arthroscopy Right 1980's  . Cardiac catheterization N/A 11/19/2015    Procedure: Right/Left Heart Cath and Coronary Angiography;  Surgeon: Lyn Records, MD;  Location: [facility] INVASIVE CV LAB;  Service: Cardiovascular;  Laterality: N/A;  . Tee without cardioversion N/A 11/20/2015    Procedure: TRANSESOPHAGEAL ECHOCARDIOGRAM (TEE);  Surgeon: Laurey Morale, MD;  Location: [facility] ENDOSCOPY;  Service: Cardiovascular;  Laterality: N/A;  . Multiple extractions with alveoloplasty Bilateral 11/21/2015    Procedure: Extraction of tooth #'s 2,12 with alveoloplasty and gross debridement of remaining dentition;  Surgeon: Charlynne Pander, DDS;  Location: [facility] OR;  Service: Oral Surgery;  Laterality: Bilateral;  . Coronary artery bypass graft N/A 11/25/2015    Procedure: CORONARY ARTERY BYPASS GRAFTING (CABG)x4 LIMA-LAD; SVG-OM; SVG-RCA; SVG-DIAG;  Surgeon: Alleen Borne, MD;  Location: [facility] OR;  Service: Open Heart Surgery;  Laterality: N/A;  .  Aortic valve replacement N/A 11/25/2015    Procedure: AORTIC VALVE REPLACEMENT (AVR) USING A MAGNA EASE ;  Surgeon: Alleen Borne, MD;  Location: [facility] OR;  Service: Open Heart Surgery;  Laterality: N/A;  . Tee without cardioversion N/A 11/25/2015    Procedure: TRANSESOPHAGEAL ECHOCARDIOGRAM (TEE);  Surgeon: Alleen Borne, MD;  Location: [facility] OR;  Service: Open Heart Surgery;  Laterality: N/A;   Family History  Problem Relation Age of Onset  . Diabetes Father   . Hypertension Mother   . Hypertension Father   . Hypertension Sister   . Hypertension Brother   . Hypertension Brother   . Hypertension Brother    Social History  Substance Use Topics  . Smoking status: Never Smoker   . Smokeless tobacco: Never Used  . Alcohol Use: 1.8 oz/week    3 Shots of liquor per week     Comment: ocassional     Review of Systems  Constitutional: Negative for fever.  Gastrointestinal: Negative for vomiting.  Neurological: Positive for weakness and numbness. Negative for headaches.  All other systems reviewed and are negative.     Allergies  Other and Hydrocodone-acetaminophen  Home Medications   Prior to Admission medications   Medication Sig Start Date End Date Taking? Authorizing Provider  allopurinol (ZYLOPRIM) 300 MG tablet Take 1 tablet (300 mg total) by mouth daily. 05/27/15  Yes Quentin Angst, MD  aspirin EC 325 MG EC tablet Take 1 tablet (  325 mg total) by mouth daily. 12/01/15  Yes Wayne E Gold, PA-C  ferrous sulfate 325 (65 FE) MG tablet Take 1 tablet (325 mg total) by mouth daily with breakfast. 12/01/15  Yes Wayne E Gold, PA-C  folic acid (FOLVITE) 1 MG tablet Take 1 tablet (1 mg total) by mouth daily. 12/01/15  Yes Wayne E Gold, PA-C  furosemide (LASIX) 40 MG tablet Take 1 tablet (40 mg total) by mouth daily. 12/01/15  Yes Wayne E Gold, PA-C  lisinopril (PRINIVIL,ZESTRIL) 5 MG tablet Take 1 tablet (5 mg total) by mouth daily. 12/04/15  Yes Erin R Barrett, PA-C  metFORMIN  (GLUCOPHAGE) 500 MG tablet Take 1 tablet (500 mg total) by mouth 2 (two) times daily with a meal. 12/04/15  Yes Donielle Margaretann Loveless, PA-C  metoprolol tartrate (LOPRESSOR) 25 MG tablet Take 1 tablet (25 mg total) by mouth 2 (two) times daily. 12/01/15  Yes Wayne E Gold, PA-C  oxyCODONE-acetaminophen (PERCOCET/ROXICET) 5-325 MG tablet Take 1-2 tablets by mouth every 6 (six) hours as needed for severe pain. 12/04/15  Yes Erin R Barrett, PA-C  potassium chloride (K-DUR,KLOR-CON) 20 MEQ tablet Take 1 tablet (20 mEq total) by mouth daily. 12/01/15  Yes Wayne E Gold, PA-C  colchicine 0.6 MG tablet Take 1 tablet (0.6 mg total) by mouth 2 (two) times daily. For gout pain Patient not taking: Reported on 12/10/2015 05/27/15   Quentin Angst, MD  esomeprazole (NEXIUM) 40 MG capsule Take 1 capsule (40 mg total) by mouth daily. Patient not taking: Reported on 12/10/2015 05/27/15   Quentin Angst, MD  pravastatin (PRAVACHOL) 20 MG tablet Take 1 tablet (20 mg total) by mouth at bedtime. Patient not taking: Reported on 12/10/2015 05/27/15   Quentin Angst, MD  sildenafil (VIAGRA) 100 MG tablet Take 0.5 tablets (50 mg total) by mouth as needed for erectile dysfunction. Patient not taking: Reported on 12/10/2015 05/27/15   Quentin Angst, MD   BP 145/89 mmHg  Pulse 77  Temp(Src) 98.3 F (36.8 C) (Oral)  Resp 16  SpO2 98% Physical Exam CONSTITUTIONAL: Well developed/well nourished HEAD: Normocephalic/atraumatic EYES: EOMI/PERRL ENMT: Mucous membranes moist NECK: supple no meningeal signs SPINE/BACK:entire spine nontender CV: S1/S2 noted, murmur noted Chest - well healing midline scar LUNGS: Lungs are clear to auscultation bilaterally, no apparent distress ABDOMEN: soft, nontender NEURO: Pt is awake/alert/appropriate, moves all extremitiesx4.  No facial droop.  No arm/leg drift.  Equal hand grips.  He has sensory deficit side of his face, right arm and right leg. EXTREMITIES: pulses normal/equal, full  ROM SKIN: warm, color normal PSYCH: no abnormalities of mood noted, alert and oriented to situation  ED Course  Procedures  11:15 PM Pt with recent CABG as well as AVR (magna-ease valve) for bicuspid aortic valve His surgery was on 3/21 and he did well Starting 2 days ago had right sided/unilateral numbness This may represent stroke He will need further workup I spoke to MRI tech, she reviewed chart and his magna-ease pericardial valve is MRI safe 11:46 PM Signed out to dr Fayrene Fearing, f/u on MRI brain  Labs Review Labs Reviewed  CBC - Abnormal; Notable for the following:    RBC 3.44 (*)    Hemoglobin 9.8 (*)    HCT 31.3 (*)    Platelets 447 (*)    All other components within normal limits  COMPREHENSIVE METABOLIC PANEL - Abnormal; Notable for the following:    CO2 20 (*)    Glucose, Bld 143 (*)  ALT 9 (*)    All other components within normal limits  PROTIME-INR  APTT  DIFFERENTIAL  I-STAT TROPOININ, ED    I have personally reviewed and evaluated these lab results as part of my medical decision-making.    MDM   Final diagnoses:  None    Nursing notes including past medical history and social history reviewed and considered in documentation Labs/vital reviewed myself and considered during evaluation Previous records reviewed and considered     Zadie Rhine, MD 12/10/15 2346

## 2015-12-10 NOTE — Telephone Encounter (Signed)
[name] is s/[ CABG X 4 on 11/25/15 and was discharged 12/04/15.  He has called today to relay numbness of his right face, arm and leg since Monday. There is also a feeling of "heat". He says there is no facial drooping or weakness.  I felt he should be evaluated in the ED and he agreed.

## 2015-12-11 ENCOUNTER — Inpatient Hospital Stay (HOSPITAL_COMMUNITY): Payer: Commercial Managed Care - HMO

## 2015-12-11 ENCOUNTER — Emergency Department (HOSPITAL_COMMUNITY): Payer: Commercial Managed Care - HMO

## 2015-12-11 ENCOUNTER — Encounter (HOSPITAL_COMMUNITY): Payer: Self-pay | Admitting: Radiology

## 2015-12-11 DIAGNOSIS — I10 Essential (primary) hypertension: Secondary | ICD-10-CM | POA: Diagnosis not present

## 2015-12-11 DIAGNOSIS — I638 Other cerebral infarction: Secondary | ICD-10-CM

## 2015-12-11 DIAGNOSIS — Z91018 Allergy to other foods: Secondary | ICD-10-CM | POA: Diagnosis not present

## 2015-12-11 DIAGNOSIS — Z7982 Long term (current) use of aspirin: Secondary | ICD-10-CM | POA: Diagnosis not present

## 2015-12-11 DIAGNOSIS — I6789 Other cerebrovascular disease: Secondary | ICD-10-CM

## 2015-12-11 DIAGNOSIS — I6381 Other cerebral infarction due to occlusion or stenosis of small artery: Secondary | ICD-10-CM | POA: Insufficient documentation

## 2015-12-11 DIAGNOSIS — E119 Type 2 diabetes mellitus without complications: Secondary | ICD-10-CM

## 2015-12-11 DIAGNOSIS — Z951 Presence of aortocoronary bypass graft: Secondary | ICD-10-CM

## 2015-12-11 DIAGNOSIS — Z953 Presence of xenogenic heart valve: Secondary | ICD-10-CM | POA: Diagnosis not present

## 2015-12-11 DIAGNOSIS — K219 Gastro-esophageal reflux disease without esophagitis: Secondary | ICD-10-CM | POA: Diagnosis present

## 2015-12-11 DIAGNOSIS — I6603 Occlusion and stenosis of bilateral middle cerebral arteries: Secondary | ICD-10-CM | POA: Diagnosis not present

## 2015-12-11 DIAGNOSIS — I2510 Atherosclerotic heart disease of native coronary artery without angina pectoris: Secondary | ICD-10-CM | POA: Insufficient documentation

## 2015-12-11 DIAGNOSIS — E7800 Pure hypercholesterolemia, unspecified: Secondary | ICD-10-CM | POA: Diagnosis present

## 2015-12-11 DIAGNOSIS — I6322 Cerebral infarction due to unspecified occlusion or stenosis of basilar arteries: Secondary | ICD-10-CM

## 2015-12-11 DIAGNOSIS — Z8249 Family history of ischemic heart disease and other diseases of the circulatory system: Secondary | ICD-10-CM | POA: Diagnosis not present

## 2015-12-11 DIAGNOSIS — I63219 Cerebral infarction due to unspecified occlusion or stenosis of unspecified vertebral arteries: Secondary | ICD-10-CM | POA: Insufficient documentation

## 2015-12-11 DIAGNOSIS — I639 Cerebral infarction, unspecified: Secondary | ICD-10-CM | POA: Diagnosis not present

## 2015-12-11 DIAGNOSIS — Z79899 Other long term (current) drug therapy: Secondary | ICD-10-CM | POA: Diagnosis not present

## 2015-12-11 DIAGNOSIS — Z7984 Long term (current) use of oral hypoglycemic drugs: Secondary | ICD-10-CM | POA: Diagnosis not present

## 2015-12-11 DIAGNOSIS — Z885 Allergy status to narcotic agent status: Secondary | ICD-10-CM | POA: Diagnosis not present

## 2015-12-11 DIAGNOSIS — I671 Cerebral aneurysm, nonruptured: Secondary | ICD-10-CM | POA: Diagnosis not present

## 2015-12-11 DIAGNOSIS — R208 Other disturbances of skin sensation: Secondary | ICD-10-CM | POA: Diagnosis present

## 2015-12-11 DIAGNOSIS — M1A0610 Idiopathic chronic gout, right knee, without tophus (tophi): Secondary | ICD-10-CM

## 2015-12-11 DIAGNOSIS — Z952 Presence of prosthetic heart valve: Secondary | ICD-10-CM

## 2015-12-11 DIAGNOSIS — M1000 Idiopathic gout, unspecified site: Secondary | ICD-10-CM | POA: Diagnosis present

## 2015-12-11 DIAGNOSIS — E785 Hyperlipidemia, unspecified: Secondary | ICD-10-CM | POA: Diagnosis present

## 2015-12-11 DIAGNOSIS — Z89021 Acquired absence of right finger(s): Secondary | ICD-10-CM | POA: Diagnosis not present

## 2015-12-11 LAB — BASIC METABOLIC PANEL
Anion gap: 12 (ref 5–15)
BUN: 14 mg/dL (ref 6–20)
CALCIUM: 9.7 mg/dL (ref 8.9–10.3)
CO2: 21 mmol/L — AB (ref 22–32)
Chloride: 104 mmol/L (ref 101–111)
Creatinine, Ser: 1.08 mg/dL (ref 0.61–1.24)
GFR calc Af Amer: >60 mL/min (ref >60)
GLUCOSE: 101 mg/dL — AB (ref 65–99)
Potassium: 4.1 mmol/L (ref 3.5–5.1)
Sodium: 137 mmol/L (ref 135–145)

## 2015-12-11 LAB — LIPID PANEL
CHOLESTEROL: 147 mg/dL (ref 0–200)
HDL: 27 mg/dL — ABNORMAL LOW (ref >40)
LDL CALC: 85 mg/dL (ref 0–99)
TRIGLYCERIDES: 173 mg/dL — AB (ref <150)
Total CHOL/HDL Ratio: 5.4 RATIO
VLDL: 35 mg/dL (ref 0–40)

## 2015-12-11 LAB — CBC
HEMATOCRIT: 31.1 % — AB (ref 39.0–52.0)
HEMOGLOBIN: 10.1 g/dL — AB (ref 13.0–17.0)
MCH: 29.5 pg (ref 26.0–34.0)
MCHC: 32.5 g/dL (ref 30.0–36.0)
MCV: 90.9 fL (ref 78.0–100.0)
Platelets: 413 10*3/uL — ABNORMAL HIGH (ref 150–400)
RBC: 3.42 MIL/uL — ABNORMAL LOW (ref 4.22–5.81)
RDW: 15.7 % — AB (ref 11.5–15.5)
WBC: 6.8 10*3/uL (ref 4.0–10.5)

## 2015-12-11 LAB — GLUCOSE, CAPILLARY
GLUCOSE-CAPILLARY: 101 mg/dL — AB (ref 65–99)
GLUCOSE-CAPILLARY: 126 mg/dL — AB (ref 65–99)
Glucose-Capillary: 101 mg/dL — ABNORMAL HIGH (ref 65–99)
Glucose-Capillary: 98 mg/dL (ref 65–99)

## 2015-12-11 LAB — ECHOCARDIOGRAM COMPLETE
Height: 65 in
Weight: 3248 oz

## 2015-12-11 LAB — TROPONIN I: Troponin I: 0.06 ng/mL — ABNORMAL HIGH (ref <0.031)

## 2015-12-11 MED ORDER — ATORVASTATIN CALCIUM 80 MG PO TABS
80.0000 mg | ORAL_TABLET | Freq: Every day | ORAL | Status: DC
  Administered 2015-12-11 – 2015-12-12 (×2): 80 mg via ORAL
  Filled 2015-12-11 (×2): qty 1

## 2015-12-11 MED ORDER — INSULIN ASPART 100 UNIT/ML ~~LOC~~ SOLN
0.0000 [IU] | Freq: Three times a day (TID) | SUBCUTANEOUS | Status: DC
  Administered 2015-12-11 – 2015-12-12 (×2): 1 [IU] via SUBCUTANEOUS

## 2015-12-11 MED ORDER — ASPIRIN 300 MG RE SUPP
300.0000 mg | Freq: Every day | RECTAL | Status: DC
  Filled 2015-12-11: qty 1

## 2015-12-11 MED ORDER — ASPIRIN 300 MG RE SUPP: 300.0000 mg | Freq: Every day | RECTAL | Status: DC

## 2015-12-11 MED ORDER — ENOXAPARIN SODIUM 40 MG/0.4ML ~~LOC~~ SOLN: 40.0000 mg | SUBCUTANEOUS | Status: DC

## 2015-12-11 MED ORDER — FERROUS SULFATE 325 (65 FE) MG PO TABS
325.0000 mg | ORAL_TABLET | Freq: Every day | ORAL | Status: DC
  Administered 2015-12-11 – 2015-12-12 (×2): 325 mg via ORAL
  Filled 2015-12-11 (×2): qty 1

## 2015-12-11 MED ORDER — FOLIC ACID 1 MG PO TABS
1.0000 mg | ORAL_TABLET | Freq: Every day | ORAL | Status: DC
  Administered 2015-12-11 – 2015-12-12 (×2): 1 mg via ORAL
  Filled 2015-12-11 (×2): qty 1

## 2015-12-11 MED ORDER — METOPROLOL TARTRATE 25 MG PO TABS
25.0000 mg | ORAL_TABLET | Freq: Two times a day (BID) | ORAL | Status: DC
  Administered 2015-12-11 – 2015-12-12 (×3): 25 mg via ORAL
  Filled 2015-12-11 (×3): qty 1

## 2015-12-11 MED ORDER — ASPIRIN 325 MG PO TABS
325.0000 mg | ORAL_TABLET | Freq: Every day | ORAL | Status: DC
  Administered 2015-12-12: 325 mg via ORAL
  Filled 2015-12-11: qty 1

## 2015-12-11 MED ORDER — CLOPIDOGREL BISULFATE 75 MG PO TABS
75.0000 mg | ORAL_TABLET | Freq: Every day | ORAL | Status: DC
  Administered 2015-12-11: 75 mg via ORAL
  Filled 2015-12-11: qty 1

## 2015-12-11 MED ORDER — LISINOPRIL 5 MG PO TABS
5.0000 mg | ORAL_TABLET | Freq: Every day | ORAL | Status: DC
  Administered 2015-12-11 – 2015-12-12 (×2): 5 mg via ORAL
  Filled 2015-12-11 (×2): qty 1

## 2015-12-11 MED ORDER — ACETAMINOPHEN 650 MG RE SUPP: 650.0000 mg | RECTAL | Status: DC | PRN

## 2015-12-11 MED ORDER — OXYCODONE-ACETAMINOPHEN 5-325 MG PO TABS: 1.0000 | ORAL_TABLET | Freq: Four times a day (QID) | ORAL | Status: DC | PRN

## 2015-12-11 MED ORDER — STROKE: EARLY STAGES OF RECOVERY BOOK
Freq: Once | Status: AC
  Administered 2015-12-11: 09:00:00

## 2015-12-11 MED ORDER — ACETAMINOPHEN 325 MG PO TABS: 650.0000 mg | ORAL_TABLET | ORAL | Status: DC | PRN

## 2015-12-11 MED ORDER — ASPIRIN 325 MG PO TABS
325.0000 mg | ORAL_TABLET | Freq: Every day | ORAL | Status: DC
  Administered 2015-12-11: 325 mg via ORAL
  Filled 2015-12-11: qty 1

## 2015-12-11 MED ORDER — FUROSEMIDE 40 MG PO TABS
40.0000 mg | ORAL_TABLET | Freq: Every day | ORAL | Status: DC
  Administered 2015-12-11 – 2015-12-12 (×2): 40 mg via ORAL
  Filled 2015-12-11 (×2): qty 1

## 2015-12-11 MED ORDER — ATORVASTATIN CALCIUM 40 MG PO TABS: 40.0000 mg | ORAL_TABLET | Freq: Every day | ORAL | Status: DC

## 2015-12-11 MED ORDER — IOPAMIDOL (ISOVUE-370) INJECTION 76%
INTRAVENOUS | Status: AC
  Administered 2015-12-11: 50 mL via INTRAVENOUS
  Filled 2015-12-11: qty 50

## 2015-12-11 MED ORDER — CLOPIDOGREL BISULFATE 75 MG PO TABS
75.0000 mg | ORAL_TABLET | Freq: Every day | ORAL | Status: DC
  Administered 2015-12-12: 75 mg via ORAL
  Filled 2015-12-11: qty 1

## 2015-12-11 MED ORDER — ALLOPURINOL 100 MG PO TABS
300.0000 mg | ORAL_TABLET | Freq: Every day | ORAL | Status: DC
  Administered 2015-12-11 – 2015-12-12 (×2): 300 mg via ORAL
  Filled 2015-12-11 (×2): qty 3

## 2015-12-11 MED ORDER — SENNOSIDES-DOCUSATE SODIUM 8.6-50 MG PO TABS: 1.0000 | ORAL_TABLET | Freq: Every evening | ORAL | Status: DC | PRN

## 2015-12-11 NOTE — Progress Notes (Signed)
Patient ID: [name], male   DOB: [birth year], 55 y.o.   MRN: [id number]   Subjective: [name] still feels a burning pain on his right side but has good strength. He denies any other complaints.  Objective: Vital signs in last 24 hours: Filed Vitals:   12/11/15 0454 12/11/15 0612 12/11/15 0707 12/11/15 0902  BP: 119/82 127/85 123/76 105/71  Pulse: 79 79 85 89  Temp:   98.4 F (36.9 C) 98.7 F (37.1 C)  TempSrc:   Oral Oral  Resp: SpO2: 98% 99% 100% 100%   General: nice guy resting in bed comfortably, appropriately conversational HEENT: no scleral icterus, extra-ocular muscles intact, oropharynx without lesions Cardiac: regular rate and rhythm, 2/6 systolic murmur loudest at right upper sternal border Pulm: breathing well, clear to auscultation bilaterally Abd: bowel sounds normal, soft, nondistended, non-tender Ext: warm and well perfused, without pedal edema Skin: no rash, hair, or nail changes Neuro: alert and oriented X3, cranial nerves II-XII grossly intact, moving all extremities well, decreased sensation to light touch on entire right side of body and face  Lab Results: Basic Metabolic Panel:  Recent Labs Lab 12/10/15 1541 12/11/15 0609  NA 138 137  K 3.8 4.1  CL 105 104  CO2 20* 21*  GLUCOSE 143* 101*  BUN 18 14  CREATININE 1.24 1.08  CALCIUM 9.8 9.7   CBC:  Recent Labs Lab 12/10/15 1541 12/11/15 0609  WBC 8.0 6.8  NEUTROABS 4.8  --   HGB 9.8* 10.1*  HCT 31.3* 31.1*  MCV 91.0 90.9  PLT 447* 413*   Fasting Lipid Panel:  Recent Labs Lab 12/11/15 0609  CHOL 147  HDL 27*  LDLCALC 85  TRIG 782*  CHOLHDL 5.4    Studies/Results: Mr Brain Wo Contrast  12/11/2015  CLINICAL DATA:  Initial evaluation for acute right-sided numbness. EXAM: MRI HEAD WITHOUT CONTRAST TECHNIQUE: Multiplanar, multiecho pulse sequences of the brain and surrounding structures were obtained without intravenous contrast. COMPARISON:  Prior CT from 09/24/2003.  FINDINGS: Mild diffuse prominence of the CSF containing spaces is compatible with generalized cerebral atrophy. Mild scattered patchy T2/FLAIR hyperintensity within the periventricular and deep white matter both cerebral hemispheres most consistent with chronic small vessel ischemic disease, mild for age. There is a 6 mm focus of restricted diffusion within the left thalamus, consistent with a small acute ischemic infarct. No associated hemorrhage or mass effect. No other infarct. Gray-white matter differentiation maintained. Major intracranial vascular flow voids are preserved. Probable additional small chronic lacunar infarct noted within the adjacent left thalamus. Question layering hyperintense FLAIR signal intensity within multiple cortical sulci of the left temporal parietal region (series 6, image 11). Uncertain why this reflects artifact. No corresponding gradient echo, diffusion abnormality, or T1 abnormality identified. No other sulcal abnormality. Few scattered tiny foci susceptibility artifact noted within the supratentorial brain, likely small chronic micro hemorrhages, most like related to chronic underlying hypertension. No mass lesion, midline shift, or mass effect. No hydrocephalus. No extra-axial fluid collection. Major dural sinuses are grossly patent. Craniocervical junction within normal limits. Visualized upper cervical spine unremarkable. Pituitary gland normal.  No acute abnormality about the orbits. Paranasal sinuses are clear. No mastoid effusion. Inner ear structures grossly normal. Bone marrow signal intensity within normal limits. No scalp soft tissue abnormality. IMPRESSION: 1. 6 mm acute ischemic nonhemorrhagic left thalamic lacunar infarct. 2. Question focal area of hyperintense FLAIR signal intensity layering within several cortical sulci of the left temporal occipital region. While this  may be artifactual in nature, possible small amount of subarachnoid hemorrhage or proteinaceous  material could also have this appearance. If there is clinical concern for possible intra cerebral infection or hemorrhage, correlation with follow-up lumbar puncture is suggested. This would likely be difficult to see on CT. 3. Additional remote left thalamic lacunar infarction. 4. Mild atrophy with chronic small vessel ischemic disease. Electronically Signed   By: Rise Mu M.D.   On: 12/11/2015 01:50   Mr Maxine Glenn Head/brain Wo Cm  12/11/2015  CLINICAL DATA:  Stroke left thalamus. EXAM: MRA HEAD WITHOUT CONTRAST TECHNIQUE: Angiographic images of the Circle of Willis were obtained using MRA technique without intravenous contrast. COMPARISON:  MRI 12/11/2015 FINDINGS: Both vertebral arteries patent to the basilar. PICA patent bilaterally. Basilar patent. AICA, superior cerebellar, posterior cerebral arteries patent bilaterally. Moderately severe stenosis distal left posterior cerebral artery and mild stenosis distal right posterior cerebral artery. Posterior communicating artery patent bilaterally. Right cavernous carotid widely patent. Right anterior cerebral artery widely patent. Moderate stenosis distal right M1 segment Left cavernous carotid widely patent. Left anterior cerebral artery widely patent Severe stenosis distal left M1 segment at the bifurcation. Left middle cerebral artery branches patent. 2 mm aneurysm right anterior communicating artery. IMPRESSION: Moderately severe stenosis distal left posterior cerebral artery. Mild disease in the right posterior cerebral artery Moderate stenosis right MCA bifurcation. Severe stenosis left MCA bifurcation. 2 mm incidental aneurysm right anterior communicating artery. Electronically Signed   By: Marlan Palau M.D.   On: 12/11/2015 07:09   Medications: I have reviewed the patient's current medications. Scheduled Meds: . allopurinol  300 mg Oral Daily  . aspirin  300 mg Rectal Daily   Or  . aspirin  325 mg Oral Daily  . atorvastatin  40 mg Oral  q1800  . clopidogrel  75 mg Oral Daily  . ferrous sulfate  325 mg Oral Q breakfast  . folic acid  1 mg Oral Daily  . furosemide  40 mg Oral Daily  . insulin aspart  0-9 Units Subcutaneous TID WC  . lisinopril  5 mg Oral Daily  . metoprolol tartrate  25 mg Oral BID   Continuous Infusions:  PRN Meds:.acetaminophen **OR** acetaminophen, oxyCODONE-acetaminophen, senna-docusate   Assessment/Plan:  [name] is a 55 year old man who recently underwent CABG and bioprosthetic AVR replacement on 3/21 presenting with a thalamic ischemic stroke.  Acute left thalamic cerebrovascular accident: He has classic thalamic pain syndrome. I don't think this is an embolic stroke related to his recent aortic valve replacement because the involved area is such a small spot in the thalamus. There was also mention of hemorrhage on brain MRI but this doesn't line up clinically and appears to be insignificant and perhaps artifact on the read. Given he had an infarct on aspirin, I think we should add clopidogrel at this point to prevent further infarction. -Thanks for your help neurology -Continue aspirin 81mg  daily -Started clopidogrel 75mg  daily -Increased atorvastatin to 80mg  daily -Regular stroke work-up pending  Coronary artery disease s/p 4V CABG and bioprosthetic aortic valve replacement for severe AI: Troponin was slightly elevated as expected given his recent surgery without any EKG changes. We'll continue his home medications. -Continue furosemide 40mg  daily -Continue metoprolol 25mg  twice daily -Continue lisinopril 5mg  daily  Type 2 diabetes: Well-controlled; last A1c 6.3 -Hold home metformin -Sensitive SSI  Intercritical gout: Not an issue. -Continue home allopurinol -Continue home colchicine, Percocet PRN for flare  Dispo: Disposition is deferred at this time, awaiting  improvement of current medical problems.  Anticipated discharge in approximately 1-2 day(s).   The patient does have a  current PCP (Quentin Angst, MD) and does need an [facility] hospital follow-up appointment after discharge.  The patient does have transportation limitations that hinder transportation to clinic appointments.  .Services Needed at time of discharge: Y = Yes, Blank = No PT:   OT:   RN:   Equipment:   Other:     LOS: 0 days   Selina Cooley, MD 12/11/2015, 11:06 AM

## 2015-12-11 NOTE — Progress Notes (Signed)
  Echocardiogram 2D Echocardiogram has been performed.  Cathie Beams 12/11/2015, 9:55 AM

## 2015-12-11 NOTE — Progress Notes (Signed)
STROKE TEAM PROGRESS NOTE   HISTORY OF PRESENT ILLNESS [name] is an 55 y.o. male with hx below and recent CABG 2 weeks ago presents with persistent R hemibody numbness that started on Monday and has persistent including face. MRI done in the ER shows L thalamic infract. Patient was not administered IV t-PA.  He was admitted for further evaluation and treatment.   SUBJECTIVE (INTERVAL HISTORY) No family is at the bedside.  Overall he feels his condition is stable. He is lying holding his post open heart surgery below.   OBJECTIVE Temp:  [98 F (36.7 C)-98.7 F (37.1 C)] 98.4 F (36.9 C) (04/06 1412) Pulse Rate:  [74-89] 79 (04/06 1412) Cardiac Rhythm:  [-] Normal sinus rhythm (04/06 0753) Resp:  [16-22] 16 (04/06 1412) BP: (105-145)/(71-97) 107/71 mmHg (04/06 1412) SpO2:  [98 %-100 %] 100 % (04/06 1412)  CBC:  Recent Labs Lab 12/10/15 1541 12/11/15 0609  WBC 8.0 6.8  NEUTROABS 4.8  --   HGB 9.8* 10.1*  HCT 31.3* 31.1*  MCV 91.0 90.9  PLT 447* 413*    Basic Metabolic Panel:  Recent Labs Lab 12/10/15 1541 12/11/15 0609  NA 138 137  K 3.8 4.1  CL 105 104  CO2 20* 21*  GLUCOSE 143* 101*  BUN 18 14  CREATININE 1.24 1.08  CALCIUM 9.8 9.7    Lipid Panel:    Component Value Date/Time   CHOL 147 12/11/2015 0609   TRIG 173* 12/11/2015 0609   HDL 27* 12/11/2015 0609   CHOLHDL 5.4 12/11/2015 0609   VLDL 35 12/11/2015 0609   LDLCALC 85 12/11/2015 0609   HgbA1c:  Lab Results  Component Value Date   HGBA1C 6.3* 11/15/2015   Urine Drug Screen:    Component Value Date/Time   LABOPIA POSITIVE* 01/08/2009 2313   COCAINSCRNUR NONE DETECTED 01/08/2009 2313   LABBENZ NONE DETECTED 01/08/2009 2313   AMPHETMU NONE DETECTED 01/08/2009 2313   THCU POSITIVE* 01/08/2009 2313   LABBARB  01/08/2009 2313    NONE DETECTED        DRUG SCREEN FOR MEDICAL PURPOSES ONLY.  IF CONFIRMATION IS NEEDED FOR ANY PURPOSE, NOTIFY LAB WITHIN 5 DAYS.        LOWEST DETECTABLE  LIMITS FOR URINE DRUG SCREEN Drug Class       Cutoff (ng/mL) Amphetamine      1000 Barbiturate      200 Benzodiazepine   200 Tricyclics       300 Opiates          300 Cocaine          300 THC              50      IMAGING  Mr Brain Wo Contrast 12/11/2015   1. 6 mm acute ischemic nonhemorrhagic left thalamic lacunar infarct. 2. Question focal area of hyperintense FLAIR signal intensity layering within several cortical sulci of the left temporal occipital region. While this may be artifactual in nature, possible small amount of subarachnoid hemorrhage or proteinaceous material could also have this appearance. If there is clinical concern for possible intra cerebral infection or hemorrhage, correlation with follow-up lumbar puncture is suggested. This would likely be difficult to see on CT. 3. Additional remote left thalamic lacunar infarction. 4. Mild atrophy with chronic small vessel ischemic disease.   Mr Maxine Glenn Head/brain Wo Cm 12/11/2015  Moderately severe stenosis distal left posterior cerebral artery. Mild disease in the right posterior cerebral artery Moderate stenosis right MCA  bifurcation. Severe stenosis left MCA bifurcation. 2 mm incidental aneurysm right anterior communicating artery.   2D Echocardiogram  - Procedure narrative: Transthoracic echocardiography. Image quality was adequate. The study was technically difficult, as a result of restricted patient mobility. - Left ventricle: The cavity size was normal. Wall thickness was increased in a pattern of severe LVH. Systolic function was normal. The estimated ejection fraction was in the range of 60% to 65%. Left ventricular diastolic function parameters were normal. - Aortic valve: AV prostheisis is diffiluclt to see well Peak and mean gradients through the valve are 22 and 11 mm Hg respectively. There is no AI. - Right ventricle: The cavity size was mildly dilated. - Right atrium: The atrium was mildly dilated.   PHYSICAL  EXAM Middle aged male not in distress. . Afebrile. Head is nontraumatic. Neck is supple without bruit.    Cardiac exam no murmur or gallop. Lungs are clear to auscultation. Distal pulses are well felt. Neurological Exam :   Awake  Alert oriented x 3. Normal speech and language.eye movements full without nystagmus.fundi were not visualized. Vision acuity and fields appear normal. Hearing is normal. Palatal movements are normal. Face symmetric. Tongue midline. Normal strength, tone, reflexes and coordination. Diminished right hemibody sensation. Gait deferred. ASSESSMENT/PLAN [name] is a 55 y.o. male with history of CABG 2 weeks ago presenting with right hemibody numbness. He did not receive IV t-PA.  Stroke:  left thalamic infarct secondary to small vessel disease source (lacunar location, less likely embolic related to recent CABG)  Resultant  Right hemisensory deficit  MRI  Left thalamic infarct  MRA  Severe stenosis, left PCA. Moderate stenosis, right MCA. Severe stenosis, left MCA bifurcation. Incidental right ACA aneurysm (2 mm)  Carotid Doppler  Done pre-CABG with no significant stenosis  2D Echo  No source of embolus  LDL 85  HgbA1c 6.3 in March  SCDs for VTE prophylaxis  Diet Heart Room service appropriate?: Yes; Fluid consistency:: Thin  aspirin 325 mg daily prior to admission, now on aspirin 325 mg daily and clopidogrel 75 mg daily. No indication for dual antiplatelets from stroke standpoint. Recommendation to continue Plavix 75 mg daily at discharge.  Ongoing aggressive stroke risk factor management  Therapy recommendations:  pending  Disposition:  Pending. Anticipate return home NOTHING FURTHER TO ADD FROM THE STROKE STANDPOINT Patient has a 10-15% risk of having another stroke over the next year, the highest risk is within 2 weeks of the most recent stroke/TIA (risk of having a stroke following a stroke or TIA is the same). Ongoing risk factor control by  Primary Care Physician Stroke Service will sign off. Please call should any needs arise. Follow-up Stroke Clinic at [facility] with Dr. Delia Heady in 2 months, order placed.  Hyperlipidemia  Home meds:  pravachol 20 mg   Changed to Lipitor 80 mg daily and hospital  LDL 85, goal < 70  Continue statin at discharge  Diabetes  HgbA1c 6.3 in March, at goal < 7.0  Other Stroke Risk Factors  ETOH use  Marijuana use, UDS not performed this admission  Coronary artery disease tatus post 4 vessel cABG and bioprosthetic aortic valve replacement for severe AI  Other Active Problems  Intercritical gout  Hospital day # 0  Rhoderick Moody [facility] See Amion for Pager information 12/11/2015 3:05 PM  I have personally examined this patient, reviewed notes, independently viewed imaging studies, participated in medical decision making and plan of care.  I have made any additions or clarifications directly to the above note. Agree with note above.  He presented with the right hemibody paresthesias secondary to small left thalamic lacunar infarct etiology likely small vessel disease. He remains at risk for recurrent stroke, TIA, neurological worsening and needs ongoing stroke evaluation and aggressive risk factor modification. Recommend aspirin for stroke prevention and increase statin dose for tighter LDL cholesterol control.  Delia Heady, MD Medical Director [facility] Pager: [phone] 12/11/2015 3:33 PM    To contact Stroke Continuity provider, please refer to [email or web address]. After hours, contact General Neurology

## 2015-12-11 NOTE — Consult Note (Signed)
   [facility] CM Inpatient Consult   12/11/2015  [name] [birth year] [id number] Patient is currently active with [facility] for chronic disease management services.  Active consent on file.  Patient has been engaged by a [employer].   Met with patient and family member at bedside.  Patient consents to ongoing care management follow up post hospitalization.  Patient has an active signed consent on file.  Patient will receive a post discharge transition of care call and will be evaluated for monthly home visits for assessments and disease process education.  . Of note, [facility] Care Management services does not replace or interfere with any services that are needed or arranged by inpatient case management or social work.  For additional questions or referrals please contact: Natividad Brood, RN BSN [facility] Liaison  [phone] business mobile phone Toll free office [phone]

## 2015-12-11 NOTE — Discharge Summary (Signed)
Name: [name] MRN: [id number] DOB: 10/21/1960 55 y.o. PCP: Quentin Angst, MD  Date of Admission: 12/10/2015  9:58 PM Date of Discharge: 12/12/2015  Attending Physician: Levert Feinstein, MD  Discharge Diagnosis: 1. Left thalamic ischemic stroke from small-vessel disease 2. Coronary artery disease status-post CABG on 11/25/2015 3. Bicuspid aortic valve status-post bovine aortic valve replacement on 11/25/2015 4. Hypertension 5. Hyperlipidemia   Discharge Medications:    Medication List    STOP taking these medications        colchicine 0.6 MG tablet     esomeprazole 40 MG capsule  Commonly known as:  NEXIUM     pravastatin 20 MG tablet  Commonly known as:  PRAVACHOL     sildenafil 100 MG tablet  Commonly known as:  VIAGRA      TAKE these medications        allopurinol 300 MG tablet  Commonly known as:  ZYLOPRIM  Take 1 tablet (300 mg total) by mouth daily.     aspirin EC 81 MG tablet  Take until July 7     atorvastatin 80 MG tablet  Commonly known as:  LIPITOR  Take 1 tablet (80 mg total) by mouth daily at 6 PM.     clopidogrel 75 MG tablet  Commonly known as:  PLAVIX  Take 1 tablet (75 mg total) by mouth daily.     ferrous sulfate 325 (65 FE) MG tablet  Take 1 tablet (325 mg total) by mouth daily with breakfast.     folic acid 1 MG tablet  Commonly known as:  FOLVITE  Take 1 tablet (1 mg total) by mouth daily.     furosemide 40 MG tablet  Commonly known as:  LASIX  Take 1 tablet (40 mg total) by mouth daily.     lisinopril 5 MG tablet  Commonly known as:  PRINIVIL,ZESTRIL  Take 1 tablet (5 mg total) by mouth daily.     metFORMIN 500 MG tablet  Commonly known as:  GLUCOPHAGE  Take 1 tablet (500 mg total) by mouth 2 (two) times daily with a meal.     metoprolol tartrate 25 MG tablet  Commonly known as:  LOPRESSOR  Take 1 tablet (25 mg total) by mouth 2 (two) times daily.     oxyCODONE-acetaminophen 5-325 MG tablet  Commonly known as:   PERCOCET/ROXICET  Take 1-2 tablets by mouth every 6 (six) hours as needed for severe pain.     potassium chloride SA 20 MEQ tablet  Commonly known as:  K-DUR,KLOR-CON  Take 1 tablet (20 mEq total) by mouth daily.        Disposition and follow-up:   [name] was discharged from [facility] in Good condition.  At the hospital follow up visit please address:  1.  Ensure he is taking both aspirin  and clopidogrel75mg  daily until July; thereafter he will only take the clopidogrel  daily  Consultations:  Neurology  Procedures Performed:   TTE 12/11/2014: Study Conclusions  - Procedure narrative: Transthoracic echocardiography. Image  quality was adequate. The study was technically difficult, as a  result of restricted patient mobility. - Left ventricle: The cavity size was normal. Wall thickness was  increased in a pattern of severe LVH. Systolic function was  normal. The estimated ejection fraction was in the range of 60%  to 65%. Left ventricular diastolic function parameters were  normal. - Aortic valve: AV prostheisis is diffiluclt to see well Peak and  mean  gradients through the valve are 22 and 11 mm Hg  respectively. There is no AI. - Right ventricle: The cavity size was mildly dilated. - Right atrium: The atrium was mildly dilated.  Mr Brain Wo Contrast  12/11/2015  CLINICAL DATA:  Initial evaluation for acute right-sided numbness. EXAM: MRI HEAD WITHOUT CONTRAST TECHNIQUE: Multiplanar, multiecho pulse sequences of the brain and surrounding structures were obtained without intravenous contrast. COMPARISON:  Prior CT from 09/24/2003. FINDINGS: Mild diffuse prominence of the CSF containing spaces is compatible with generalized cerebral atrophy. Mild scattered patchy T2/FLAIR hyperintensity within the periventricular and deep white matter both cerebral hemispheres most consistent with chronic small vessel ischemic disease, mild for age.  There is a 6 mm focus of restricted diffusion within the left thalamus, consistent with a small acute ischemic infarct. No associated hemorrhage or mass effect. No other infarct. Gray-white matter differentiation maintained. Major intracranial vascular flow voids are preserved. Probable additional small chronic lacunar infarct noted within the adjacent left thalamus. Question layering hyperintense FLAIR signal intensity within multiple cortical sulci of the left temporal parietal region (series 6, image 11). Uncertain why this reflects artifact. No corresponding gradient echo, diffusion abnormality, or T1 abnormality identified. No other sulcal abnormality. Few scattered tiny foci susceptibility artifact noted within the supratentorial brain, likely small chronic micro hemorrhages, most like related to chronic underlying hypertension. No mass lesion, midline shift, or mass effect. No hydrocephalus. No extra-axial fluid collection. Major dural sinuses are grossly patent. Craniocervical junction within normal limits. Visualized upper cervical spine unremarkable. Pituitary gland normal.  No acute abnormality about the orbits. Paranasal sinuses are clear. No mastoid effusion. Inner ear structures grossly normal. Bone marrow signal intensity within normal limits. No scalp soft tissue abnormality. IMPRESSION: 1. 6 mm acute ischemic nonhemorrhagic left thalamic lacunar infarct. 2. Question focal area of hyperintense FLAIR signal intensity layering within several cortical sulci of the left temporal occipital region. While this may be artifactual in nature, possible small amount of subarachnoid hemorrhage or proteinaceous material could also have this appearance. If there is clinical concern for possible intra cerebral infection or hemorrhage, correlation with follow-up lumbar puncture is suggested. This would likely be difficult to see on CT. 3. Additional remote left thalamic lacunar infarction. 4. Mild atrophy with  chronic small vessel ischemic disease. Electronically Signed   By: Rise Mu M.D.   On: 12/11/2015 01:50    Mr Maxine Glenn Head/brain Wo Cm  12/11/2015  CLINICAL DATA:  Stroke left thalamus. EXAM: MRA HEAD WITHOUT CONTRAST TECHNIQUE: Angiographic images of the Circle of Willis were obtained using MRA technique without intravenous contrast. COMPARISON:  MRI 12/11/2015 FINDINGS: Both vertebral arteries patent to the basilar. PICA patent bilaterally. Basilar patent. AICA, superior cerebellar, posterior cerebral arteries patent bilaterally. Moderately severe stenosis distal left posterior cerebral artery and mild stenosis distal right posterior cerebral artery. Posterior communicating artery patent bilaterally. Right cavernous carotid widely patent. Right anterior cerebral artery widely patent. Moderate stenosis distal right M1 segment Left cavernous carotid widely patent. Left anterior cerebral artery widely patent Severe stenosis distal left M1 segment at the bifurcation. Left middle cerebral artery branches patent. 2 mm aneurysm right anterior communicating artery. IMPRESSION: Moderately severe stenosis distal left posterior cerebral artery. Mild disease in the right posterior cerebral artery Moderate stenosis right MCA bifurcation. Severe stenosis left MCA bifurcation. 2 mm incidental aneurysm right anterior communicating artery. Electronically Signed   By: Marlan Palau M.D.   On: 12/11/2015 07:09   Admission HPI:  [name] is a  55yo with gout, T2DM, HTN, CAD s/p 4V CABG and bicuspid AV s/p bioprosthetic AV replacement both 2 weeks ago who presents with right-sided numbness that started 2 days ago. He says his entire right side of his face, his right arm and leg all feel numb, as well as a 'warmth' sensation particularly over his right face. He says his eye looked a little drooped this morning as well. He denies any left-sided symptoms and denies any weakness whatsoever. He denies any slurred speech.  After the surgery, he was otherwise feeling well until these symptoms arose 2 days ago. He denies fevers, dizziness, lightheadedness, headache, vision changes, syncope, chest pain, shortness of breath, palpitations, N/V/D, urinary symptoms, leg pain/redness/swelling, or other symptoms at this time. He has been taking all his prescribed medicines except ASA 81 instead of 325, and says that the [facility] team 'talked to him about a cholesterol drug' but he wasn't sure if he needed to start taking it and didn't have a pravastatin bottle with him today (although he was previously prescribed a statin).   Hospital Course by problem list:  1. Left thalamic ischemic stroke: He presented with acute-onset right-sided numbness and burning sensation and was found to have a left thalamic infarct on brain MRI. Although he had recently undergone bioprosthetic aortic valve replacement on 11/25/2015, this did not appear consistent with an embolic stroke on imaging and was presumed to be small-vessel disease. He has extensive small-vessel disease throughout his brain. Because he had an ischemic stroke on aspirin 81mg  daily, we added clopidogrel 75mg  daily in addition and advised him to take this for 3 months. Afterward, he will only take clopidogrel 75mg  daily. He was also discharged on atorvastatin 80mg  daily.  2. Coronary artery disease status-post CABG: He denied any chest pain and his surgical site appeared to be healing well. An EKG did not show ischemic changes and he had flat troponins at 0.06. A repeat EKG on the day of discharge showed no changes. He was discharged on aspirin 81mg  and clopidogrel 75mg  daily per above.  3. Bicuspid aortic valve status-post bovine aortic valve replacement: A transthoracic echocardiogram showed normal trans-valvular aortic pressures without evidence of aortic insufficiency. We notified Dr. Laneta Simmers he was admitted but I do not think this was embolic-related to his recent surgery. He was  discharged on aspirin 81mg  and clopidogrel 75mg  daily per above.  4. Hypertension: His pressures were well-controlled on lisinopril 5mg  daily; he was discharged on this medication alone.  5. Hyperlipidemia: Given the extent of his coronary artery disease and new presentation of stroke, along with LDL 85,  we increased his atorvastatin from 40 to 80mg  daily.  Discharge Vitals:   BP 117/75 mmHg  Pulse 82  Temp(Src) 98.1 F (36.7 C) (Oral)  Resp 20  SpO2 100%  Discharge Labs:  Results for orders placed or performed during the hospital encounter of 12/10/15 (from the past 24 hour(s))  Glucose, capillary     Status: Abnormal   Collection Time: 12/11/15 12:12 PM  Result Value Ref Range   Glucose-Capillary 101 (H) 65 - 99 mg/dL  Glucose, capillary     Status: Abnormal   Collection Time: 12/11/15  4:30 PM  Result Value Ref Range   Glucose-Capillary 126 (H) 65 - 99 mg/dL  Glucose, capillary     Status: None   Collection Time: 12/11/15  9:52 PM  Result Value Ref Range   Glucose-Capillary 98 65 - 99 mg/dL   Comment 1 Notify RN  Comment 2 Document in Chart   Glucose, capillary     Status: None   Collection Time: 12/12/15  6:25 AM  Result Value Ref Range   Glucose-Capillary 98 65 - 99 mg/dL   Comment 1 Notify RN    Comment 2 Document in Chart     Signed: Selina Cooley, MD 12/12/2015, 10:23 AM

## 2015-12-11 NOTE — ED Notes (Signed)
Dr. James at bedside  

## 2015-12-11 NOTE — Consult Note (Signed)
NEURO HOSPITALIST CONSULT NOTE      Reason for Consult: R sided numbness since Monday, MRI positive for acute stroke   History obtained from:  Patient     HPI:                                                                                                                                          [name] is an 55 y.o. male with hx below and recent CABG 2 weeks ago presents with persistent R hemibody numbness that started on Monday and has persistent including face. MRI done in the ER shows L thalamic infract.  Past Medical History  Diagnosis Date  . Hypertension   . Hypercholesteremia   . Baker's cyst of knee   . Type II diabetes mellitus (HCC)   . GERD (gastroesophageal reflux disease)   . Gouty arthritis     "real bad" (01/17/2013)    Past Surgical History  Procedure Laterality Date  . Finger amputation Right 1980's    3rd & 4th digits "got them mashed" (01/17/2013)  . Knee arthroscopy Right 1980's  . Cardiac catheterization N/A 11/19/2015    Procedure: Right/Left Heart Cath and Coronary Angiography;  Surgeon: Lyn Records, MD;  Location: [facility] INVASIVE CV LAB;  Service: Cardiovascular;  Laterality: N/A;  . Tee without cardioversion N/A 11/20/2015    Procedure: TRANSESOPHAGEAL ECHOCARDIOGRAM (TEE);  Surgeon: Laurey Morale, MD;  Location: [facility] ENDOSCOPY;  Service: Cardiovascular;  Laterality: N/A;  . Multiple extractions with alveoloplasty Bilateral 11/21/2015    Procedure: Extraction of tooth #'s 2,12 with alveoloplasty and gross debridement of remaining dentition;  Surgeon: Charlynne Pander, DDS;  Location: [facility] OR;  Service: Oral Surgery;  Laterality: Bilateral;  . Coronary artery bypass graft N/A 11/25/2015    Procedure: CORONARY ARTERY BYPASS GRAFTING (CABG)x4 LIMA-LAD; SVG-OM; SVG-RCA; SVG-DIAG;  Surgeon: Alleen Borne, MD;  Location: [facility] OR;  Service: Open Heart Surgery;  Laterality: N/A;  . Aortic valve replacement N/A 11/25/2015    Procedure: AORTIC  VALVE REPLACEMENT (AVR) USING A MAGNA EASE ;  Surgeon: Alleen Borne, MD;  Location: [facility] OR;  Service: Open Heart Surgery;  Laterality: N/A;  . Tee without cardioversion N/A 11/25/2015    Procedure: TRANSESOPHAGEAL ECHOCARDIOGRAM (TEE);  Surgeon: Alleen Borne, MD;  Location: [facility] OR;  Service: Open Heart Surgery;  Laterality: N/A;    Family History  Problem Relation Age of Onset  . Diabetes Father   . Hypertension Mother   . Hypertension Father   . Hypertension Sister   . Hypertension Brother   . Hypertension Brother   . Hypertension Brother      Social History:  reports that he has never smoked. He has never used smokeless tobacco. He reports that he drinks about  1.8 oz of alcohol per week. He reports that he uses illicit drugs (Marijuana).  Allergies  Allergen Reactions  . Other Anaphylaxis    mushrooms  . Hydrocodone-Acetaminophen Itching    MEDICATIONS:                                                                                                                     I have reviewed the patient's current medications.   ROS:                                                                                                                                       History obtained from the patient  General ROS: negative for - chills, fatigue, fever, night sweats, weight gain or weight loss Psychological ROS: negative for - behavioral disorder, hallucinations, memory difficulties, mood swings or suicidal ideation Ophthalmic ROS: negative for - blurry vision, double vision, eye pain or loss of vision ENT ROS: negative for - epistaxis, nasal discharge, oral lesions, sore throat, tinnitus or vertigo Allergy and Immunology ROS: negative for - hives or itchy/watery eyes Hematological and Lymphatic ROS: negative for - bleeding problems, bruising or swollen lymph nodes Endocrine ROS: negative for - galactorrhea, hair pattern changes, polydipsia/polyuria or temperature  intolerance Respiratory ROS: negative for - cough, hemoptysis, shortness of breath or wheezing Cardiovascular ROS: negative for - chest pain, dyspnea on exertion, edema or irregular heartbeat Gastrointestinal ROS: negative for - abdominal pain, diarrhea, hematemesis, nausea/vomiting or stool incontinence Genito-Urinary ROS: negative for - dysuria, hematuria, incontinence or urinary frequency/urgency Musculoskeletal ROS: negative for - joint swelling or muscular weakness Neurological ROS: as noted in HPI Dermatological ROS: negative for rash and skin lesion changes   Blood pressure 116/92, pulse 86, temperature 98.1 F (36.7 C), temperature source Oral, resp. rate 16, SpO2 100 %.   Neurologic Examination:  HEENT-  Normocephalic, no lesions, without obvious abnormality.  Normal external eye and conjunctiva.  Normal TM's bilaterally.  Normal auditory canals and external ears. Normal external nose, mucus membranes and septum.  Normal pharynx. Cardiovascular- regular rate and rhythm, S1, S2 normal, no murmur, click, rub or gallop, pulses palpable throughout   Lungs- chest clear, no wheezing, rales, normal symmetric air entry, Heart exam - S1, S2 normal, no murmur, no gallop, rate regular Abdomen- soft, non-tender; bowel sounds normal; no masses,  no organomegaly   Neurological Examination Mental Status: Alert, oriented, thought content appropriate.  Speech fluent without evidence of aphasia.  Able to follow 2 step commands without difficulty. Cranial Nerves: II: Discs flat bilaterally; Visual fields grossly normal, pupils equal, round, reactive to light and accommodation III,IV, VI: ptosis not present, extra-ocular motions intact bilaterally V,VII: smile symmetric, R facial numbness  VIII: hearing normal bilaterally IX,X: uvula rises symmetrically XI: bilateral shoulder shrug XII: midline  tongue extension Motor: Right : Upper extremity   5/5    Left:     Upper extremity   5/5  Lower extremity   5/5     Lower extremity   5/5 Tone and bulk:normal tone throughout; no atrophy noted Sensory: Pinprick and light touch decreased on R hemibody Cerebellar: normal finger-to-nose, normal rapid alternating movements and normal heel-to-shin test       Lab Results: Basic Metabolic Panel:  Recent Labs Lab 12/10/15 1541  NA 138  K 3.8  CL 105  CO2 20*  GLUCOSE 143*  BUN 18  CREATININE 1.24  CALCIUM 9.8    Liver Function Tests:  Recent Labs Lab 12/10/15 1541  AST 28  ALT 9*  ALKPHOS 109  BILITOT 0.4  PROT 7.7  ALBUMIN 3.7   No results for input(s): LIPASE, AMYLASE in the last 168 hours. No results for input(s): AMMONIA in the last 168 hours.  CBC:  Recent Labs Lab 12/10/15 1541  WBC 8.0  NEUTROABS 4.8  HGB 9.8*  HCT 31.3*  MCV 91.0  PLT 447*    Cardiac Enzymes: No results for input(s): CKTOTAL, CKMB, CKMBINDEX, TROPONINI in the last 168 hours.  Lipid Panel: No results for input(s): CHOL, TRIG, HDL, CHOLHDL, VLDL, LDLCALC in the last 168 hours.  CBG:  Recent Labs Lab 12/04/15 0634  GLUCAP 85    Microbiology: Results for orders placed or performed during the hospital encounter of 11/15/15  Surgical pcr screen     Status: Abnormal   Collection Time: 11/21/15  4:54 AM  Result Value Ref Range Status   MRSA, PCR NEGATIVE NEGATIVE Final   Staphylococcus aureus POSITIVE (A) NEGATIVE Final    Comment:        The Xpert SA Assay (FDA approved for NASAL specimens in patients over 56 years of age), is one component of a comprehensive surveillance program.  Test performance has been validated by [facility] for patients greater than or equal to 56 year old. It is not intended to diagnose infection nor to guide or monitor treatment.   Surgical pcr screen     Status: None   Collection Time: 11/24/15 11:29 PM  Result Value Ref Range Status    MRSA, PCR NEGATIVE NEGATIVE Final   Staphylococcus aureus NEGATIVE NEGATIVE Final    Comment:        The Xpert SA Assay (FDA approved for NASAL specimens in patients over 68 years of age), is one component of a comprehensive surveillance program.  Test performance has been validated by [facility] for patients greater than or equal to 66 year old. It is not intended to diagnose infection nor to guide or monitor treatment.     Coagulation Studies:  Recent Labs  12/10/15 1541  LABPROT 14.9  INR 1.15    Imaging: Mr Brain Wo Contrast  12/11/2015  CLINICAL DATA:  Initial evaluation for acute right-sided numbness. EXAM: MRI HEAD WITHOUT CONTRAST TECHNIQUE: Multiplanar, multiecho pulse sequences of the brain and surrounding structures were obtained without intravenous contrast. COMPARISON:  Prior CT from 09/24/2003. FINDINGS: Mild diffuse prominence of the CSF containing spaces is compatible with generalized cerebral atrophy. Mild scattered patchy T2/FLAIR hyperintensity within the periventricular and deep white matter both cerebral hemispheres most consistent with chronic small vessel ischemic disease, mild for age. There is a 6 mm focus of restricted diffusion within the left thalamus, consistent with a small acute ischemic infarct. No associated hemorrhage or mass effect. No other infarct. Gray-white matter differentiation maintained. Major intracranial vascular flow voids are preserved. Probable additional small chronic lacunar infarct noted within the adjacent left thalamus. Question layering hyperintense FLAIR signal intensity within multiple cortical sulci of the left temporal parietal region (series 6, image 11). Uncertain why this reflects artifact. No corresponding gradient echo, diffusion abnormality, or T1 abnormality identified. No other sulcal abnormality. Few scattered tiny foci susceptibility artifact noted within the supratentorial brain, likely small chronic micro hemorrhages,  most like related to chronic underlying hypertension. No mass lesion, midline shift, or mass effect. No hydrocephalus. No extra-axial fluid collection. Major dural sinuses are grossly patent. Craniocervical junction within normal limits. Visualized upper cervical spine unremarkable. Pituitary gland normal.  No acute abnormality about the orbits. Paranasal sinuses are clear. No mastoid effusion. Inner ear structures grossly normal. Bone marrow signal intensity within normal limits. No scalp soft tissue abnormality. IMPRESSION: 1. 6 mm acute ischemic nonhemorrhagic left thalamic lacunar infarct. 2. Question focal area of hyperintense FLAIR signal intensity layering within several cortical sulci of the left temporal occipital region. While this may be artifactual in nature, possible small amount of subarachnoid hemorrhage or proteinaceous material could also have this appearance. If there is clinical concern for possible intra cerebral infection or hemorrhage, correlation with follow-up lumbar puncture is suggested. This would likely be difficult to see on CT. 3. Additional remote left thalamic lacunar infarction. 4. Mild atrophy with chronic small vessel ischemic disease. Electronically Signed   By: Rise Mu M.D.   On: 12/11/2015 01:50      Assessment/Plan: 54 y.o. male with hx below and recent CABG 2 weeks ago presents with persistent R hemibody numbness that started on Monday and has persistent including face. MRI done in the ER shows L thalamic infract.  1. HgbA1c, fasting lipid panel 2.  MRA  of the brain without contrast 3. PT consult, OT consult, Speech consult 4. Echocardiogram 5. Carotid dopplers 6. Prophylactic therapy-Antiplatelet med: Aspirin - dose 325mg  daily 7. Risk factor modification 8. Telemetry monitoring 9. Frequent neuro checks 10 NPO until passes stroke swallow screen 11. CTA head and neck 12. Normotension SBP < 140 due to onset of symptoms 3 days ago.

## 2015-12-11 NOTE — ED Notes (Signed)
Attempted to call report

## 2015-12-11 NOTE — H&P (Signed)
Date: 12/11/2015               Patient Name:  [name] MRN: [id number]  DOB: 1961/01/03 Age / Sex: 55 y.o., male   PCP: Quentin Angst, MD         Medical Service: Internal Medicine Teaching Service         Attending Physician: Dr. Rolland Porter, MD    First Contact: Dr. Selina Cooley Pager: [phone]  Second Contact: Dr. Heywood Iles Pager: [phone]       After Hours (After 5p/  First Contact Pager: [phone]  weekends / holidays): Second Contact Pager: [phone]   Chief Complaint: Right-sided numbness  History of Present Illness: [name] is a 55yo with gout, T2DM, HTN, CAD s/p 4V CABG and bicuspid AV s/p bioprosthetic AV replacement both 2 weeks ago who presents with right-sided numbness that started 2 days ago. He says his entire right side of his face, his right arm and leg all feel numb, as well as a 'warmth' sensation particularly over his right face. He says his eye looked a little drooped this morning as well. He denies any left-sided symptoms and denies any weakness whatsoever. He denies any slurred speech. After the surgery, he was otherwise feeling well until these symptoms arose 2 days ago. He denies fevers, dizziness, lightheadedness, headache, vision changes, syncope, chest pain, shortness of breath, palpitations, N/V/D, urinary symptoms, leg pain/redness/swelling, or other symptoms at this time. He has been taking all his prescribed medicines except ASA 81 instead of 325, and says that the [facility] team 'talked to him about a cholesterol drug' but he wasn't sure if he needed to start taking it and didn't have a pravastatin bottle with him today (although he was previously prescribed a statin).   Meds: No current facility-administered medications for this encounter.   Current Outpatient Prescriptions  Medication Sig Dispense Refill  . allopurinol (ZYLOPRIM) 300 MG tablet Take 1 tablet (300 mg total) by mouth daily. 30 tablet 2  . aspirin EC 325 MG EC tablet Take 1 tablet (325  mg total) by mouth daily.    . ferrous sulfate 325 (65 FE) MG tablet Take 1 tablet (325 mg total) by mouth daily with breakfast. 30 tablet 1  . folic acid (FOLVITE) 1 MG tablet Take 1 tablet (1 mg total) by mouth daily. 30 tablet 1  . furosemide (LASIX) 40 MG tablet Take 1 tablet (40 mg total) by mouth daily. 7 tablet 0  . lisinopril (PRINIVIL,ZESTRIL) 5 MG tablet Take 1 tablet (5 mg total) by mouth daily. 30 tablet 3  . metFORMIN (GLUCOPHAGE) 500 MG tablet Take 1 tablet (500 mg total) by mouth 2 (two) times daily with a meal. 60 tablet 2  . metoprolol tartrate (LOPRESSOR) 25 MG tablet Take 1 tablet (25 mg total) by mouth 2 (two) times daily. 60 tablet 1  . oxyCODONE-acetaminophen (PERCOCET/ROXICET) 5-325 MG tablet Take 1-2 tablets by mouth every 6 (six) hours as needed for severe pain. 30 tablet 0  . potassium chloride (K-DUR,KLOR-CON) 20 MEQ tablet Take 1 tablet (20 mEq total) by mouth daily. 7 tablet 0  . colchicine 0.6 MG tablet Take 1 tablet (0.6 mg total) by mouth 2 (two) times daily. For gout pain (Patient not taking: Reported on 12/10/2015) 30 tablet 2  . esomeprazole (NEXIUM) 40 MG capsule Take 1 capsule (40 mg total) by mouth daily. (Patient not taking: Reported on 12/10/2015) 30 capsule 2  . pravastatin (PRAVACHOL) 20 MG tablet Take 1  tablet (20 mg total) by mouth at bedtime. (Patient not taking: Reported on 12/10/2015) 30 tablet 2  . sildenafil (VIAGRA) 100 MG tablet Take 0.5 tablets (50 mg total) by mouth as needed for erectile dysfunction. (Patient not taking: Reported on 12/10/2015) 5 tablet 11    Allergies: Allergies as of 12/10/2015 - Review Complete 12/10/2015  Allergen Reaction Noted  . Other Anaphylaxis 11/17/2013  . Hydrocodone-acetaminophen Itching 04/08/2010   Past Medical History  Diagnosis Date  . Hypertension   . Hypercholesteremia   . Baker's cyst of knee   . Type II diabetes mellitus (HCC)   . GERD (gastroesophageal reflux disease)   . Gouty arthritis     "real bad"  (01/17/2013)   Past Surgical History  Procedure Laterality Date  . Finger amputation Right 1980's    3rd & 4th digits "got them mashed" (01/17/2013)  . Knee arthroscopy Right 1980's  . Cardiac catheterization N/A 11/19/2015    Procedure: Right/Left Heart Cath and Coronary Angiography;  Surgeon: Lyn Records, MD;  Location: [facility] INVASIVE CV LAB;  Service: Cardiovascular;  Laterality: N/A;  . Tee without cardioversion N/A 11/20/2015    Procedure: TRANSESOPHAGEAL ECHOCARDIOGRAM (TEE);  Surgeon: Laurey Morale, MD;  Location: [facility] ENDOSCOPY;  Service: Cardiovascular;  Laterality: N/A;  . Multiple extractions with alveoloplasty Bilateral 11/21/2015    Procedure: Extraction of tooth #'s 2,12 with alveoloplasty and gross debridement of remaining dentition;  Surgeon: Charlynne Pander, DDS;  Location: [facility] OR;  Service: Oral Surgery;  Laterality: Bilateral;  . Coronary artery bypass graft N/A 11/25/2015    Procedure: CORONARY ARTERY BYPASS GRAFTING (CABG)x4 LIMA-LAD; SVG-OM; SVG-RCA; SVG-DIAG;  Surgeon: Alleen Borne, MD;  Location: [facility] OR;  Service: Open Heart Surgery;  Laterality: N/A;  . Aortic valve replacement N/A 11/25/2015    Procedure: AORTIC VALVE REPLACEMENT (AVR) USING A MAGNA EASE ;  Surgeon: Alleen Borne, MD;  Location: [facility] OR;  Service: Open Heart Surgery;  Laterality: N/A;  . Tee without cardioversion N/A 11/25/2015    Procedure: TRANSESOPHAGEAL ECHOCARDIOGRAM (TEE);  Surgeon: Alleen Borne, MD;  Location: [facility] OR;  Service: Open Heart Surgery;  Laterality: N/A;   Family History  Problem Relation Age of Onset  . Diabetes Father   . Hypertension Mother   . Hypertension Father   . Hypertension Sister   . Hypertension Brother   . Hypertension Brother   . Hypertension Brother    Social History   Social History  . Marital Status: Widowed    Spouse Name: N/A  . Number of Children: N/A  . Years of Education: N/A   Occupational History  . Not on file.   Social History Main Topics  .  Smoking status: Never Smoker   . Smokeless tobacco: Never Used  . Alcohol Use: 1.8 oz/week    3 Shots of liquor per week     Comment: ocassional   . Drug Use: Yes    Special: Marijuana     Comment: last weed was 1 week ago. 04/16/15  . Sexual Activity: Yes   Other Topics Concern  . Not on file   Social History Narrative    Review of Systems: Pertinent items noted in HPI and remainder of comprehensive ROS otherwise negative.  Physical Exam: Blood pressure 116/92, pulse 86, temperature 98.1 F (36.7 C), temperature source Oral, resp. rate 16, SpO2 100 %.   Gen: Well-appearing, alert and oriented to person, place, and time HEENT: Oropharynx clear without erythema or exudate.  Neck:  No cervical LAD, no thyromegaly or nodules, no JVD noted. CV: Normal rate, regular rhythm, no murmurs, rubs, or gallops. Well-healed midline sternotomy incision. Pulmonary: Normal effort, CTA bilaterally, no crackles or wheezes Abdominal: Soft, non-tender, non-distended, without rebound, guarding, or masses Extremities: Distal pulses 2+ in upper and lower extremities bilaterally, no tenderness, erythema or edema Neuro: CN II-XII grossly intact. Strength 5/5 in upper and lower extremities bilaterally. Reflexes 2+ symmetrically. Decreased sensation to fine touch over the right face, arm and leg. Normal FT sensation on left side. No pronator drift. No dysdiadochokinesia or finger-to-nose dysmetria noted. Skin: No atypical appearing moles. No rashes  Lab results: Basic Metabolic Panel:  Recent Labs  96/04/54 1541  NA 138  K 3.8  CL 105  CO2 20*  GLUCOSE 143*  BUN 18  CREATININE 1.24  CALCIUM 9.8   Liver Function Tests:  Recent Labs  12/10/15 1541  AST 28  ALT 9*  ALKPHOS 109  BILITOT 0.4  PROT 7.7  ALBUMIN 3.7   CBC:  Recent Labs  12/10/15 1541  WBC 8.0  NEUTROABS 4.8  HGB 9.8*  HCT 31.3*  MCV 91.0  PLT 447*   Coagulation:  Recent Labs  12/10/15 1541  LABPROT 14.9  INR  1.15   Imaging results:  Mr Brain Wo Contrast  12/11/2015  CLINICAL DATA:  Initial evaluation for acute right-sided numbness. EXAM: MRI HEAD WITHOUT CONTRAST TECHNIQUE: Multiplanar, multiecho pulse sequences of the brain and surrounding structures were obtained without intravenous contrast. COMPARISON:  Prior CT from 09/24/2003. FINDINGS: Mild diffuse prominence of the CSF containing spaces is compatible with generalized cerebral atrophy. Mild scattered patchy T2/FLAIR hyperintensity within the periventricular and deep white matter both cerebral hemispheres most consistent with chronic small vessel ischemic disease, mild for age. There is a 6 mm focus of restricted diffusion within the left thalamus, consistent with a small acute ischemic infarct. No associated hemorrhage or mass effect. No other infarct. Gray-white matter differentiation maintained. Major intracranial vascular flow voids are preserved. Probable additional small chronic lacunar infarct noted within the adjacent left thalamus. Question layering hyperintense FLAIR signal intensity within multiple cortical sulci of the left temporal parietal region (series 6, image 11). Uncertain why this reflects artifact. No corresponding gradient echo, diffusion abnormality, or T1 abnormality identified. No other sulcal abnormality. Few scattered tiny foci susceptibility artifact noted within the supratentorial brain, likely small chronic micro hemorrhages, most like related to chronic underlying hypertension. No mass lesion, midline shift, or mass effect. No hydrocephalus. No extra-axial fluid collection. Major dural sinuses are grossly patent. Craniocervical junction within normal limits. Visualized upper cervical spine unremarkable. Pituitary gland normal.  No acute abnormality about the orbits. Paranasal sinuses are clear. No mastoid effusion. Inner ear structures grossly normal. Bone marrow signal intensity within normal limits. No scalp soft tissue  abnormality. IMPRESSION: 1. 6 mm acute ischemic nonhemorrhagic left thalamic lacunar infarct. 2. Question focal area of hyperintense FLAIR signal intensity layering within several cortical sulci of the left temporal occipital region. While this may be artifactual in nature, possible small amount of subarachnoid hemorrhage or proteinaceous material could also have this appearance. If there is clinical concern for possible intra cerebral infection or hemorrhage, correlation with follow-up lumbar puncture is suggested. This would likely be difficult to see on CT. 3. Additional remote left thalamic lacunar infarction. 4. Mild atrophy with chronic small vessel ischemic disease. Electronically Signed   By: Rise Mu M.D.   On: 12/11/2015 01:50    Other results: EKG: normal  sinus rhythm, occasional PVC noted, unifocal, TWI in I, II, V1, similar to previous EKG.  Assessment & Plan by Problem: 1. Acute CVA of L thalamus - consistent territory with R sided numbness. MRI confirming 6mm acute ischemic CVA of L thalamus. Recent CABG and bioprosthetic AVR ~2 weeks ago, complicated by postoperative AFib that lasted <24hrs and converted with amiodarone, now on metoprolol. Although he is a CHADS-VASc of 3 (now 5 if counting CVA) so would warrant AC in most AFib cases, it was not believed to be required given duration. For antiplatelet therapy, the patient was discharged with ASA 325, but was taking . He had been prescribed a statin prior to admission but had not been taking it and although it was discussed on his last admission, I reviewed his medications with him today and he is in fact not taking a statin either. Unsure if this would be considered an ASA failure as he was on  consistently, but not the prescribed . Will need to clarify with neurology +/- cards whether ASA + statin is sufficient secondary prevention in his case. -Neurology on board; appreciate the thoughtful care of this mutual  patient -Continue ASA 325, statin. Will need to clarify any potential further antiplatelet/AC -Telemetry -PT/OT/SLP -Lipid panel, CBC, BMP  2. CAD s/p 4V CABG and bioprosthetic AVR for severe AI - was initially admitted by IMTS but transferred to CVTS service for surgery, done 11/25/15 -Continue ASA, statin. Will need to reconcile antiplatelet/AC situation as discussed above -Continue home metoprolol -Lisinopril was held at last admission 2/2 low BP, AKI with plans to reassess at cardiology follow-up later this month. Can restart now given Cr, BP  3. HTN -Continue metoprolol, lisinopril, lasix  4. T2DM - well-controlled; last A1c -Hold home metformin -Sensitive SSI  5. Gout - last attack was during last hospitalization, Rt knee. No s/s gout flare currently. -Continue home allopurinol -Continue home colchicine, Percocet PRN for flare  DVT PPX - SCDs for now - change after MRA (artifact on MRI could not r/o hemorrhage although likelihood is low)  Dispo: Disposition is deferred at this time, awaiting improvement of current medical problems. Anticipated discharge in approximately 1-2 day(s).   The patient does have a current PCP (Quentin Angst, MD) and does need an [facility] hospital follow-up appointment after discharge.  The patient does not have transportation limitations that hinder transportation to clinic appointments.  Signed: Darrick Huntsman, MD 12/11/2015, 4:02 AM

## 2015-12-11 NOTE — Care Management Note (Signed)
Case Management Note  Patient Details  Name: [name] MRN: [id number] Date of Birth: 1961/03/24  Subjective/Objective:                    Action/Plan: Patient was admitted with CVA. Lives at home alone. Will follow for discharge needs pending PT/OT evals and physician orders.  Expected Discharge Date:                  Expected Discharge Plan:     In-House Referral:     Discharge planning Services     Post Acute Care Choice:    Choice offered to:     DME Arranged:    DME Agency:     HH Arranged:    HH Agency:     Status of Service:  In process, will continue to follow  Medicare Important Message Given:    Date Medicare IM Given:    Medicare IM give by:    Date Additional Medicare IM Given:    Additional Medicare Important Message give by:     If discussed at Long Length of Stay Meetings, dates discussed:    Additional Comments:  Anda Kraft, RN 12/11/2015, 9:10 AM [phone]

## 2015-12-11 NOTE — ED Notes (Signed)
Neurologist at bedside. 

## 2015-12-11 NOTE — Progress Notes (Signed)
Patient admitted to room 5M09. Patient is alert, oriented x4, c/o of numbness in right side, tele set up, skin intact. Report to be given to day rn.

## 2015-12-11 NOTE — ED Provider Notes (Signed)
Patient discussed with Dr. Bebe Shaggy. CT, and MRI results noted. Care discussed with neuro hospitalist. Will be seen in consultation. Patient was just discharged from CT surgery service. However was admitted to internal medicine resident team for that admission. I discussed with resident on call. Patient to be evaluated, and admitted for further care.  Rolland Porter, MD 12/11/15 [phone]

## 2015-12-11 NOTE — Progress Notes (Signed)
Patient currently has SCDs as vte. Patient had CABG 3 weeks ago, still complains of soreness and tenderness in right leg at area of incision. Cannot tolerate scds. He ambulates frequently in room, suggested to do foot pumps as well. Night coverage IM resident notified via amion. Will continue to monitor.

## 2015-12-11 NOTE — ED Notes (Signed)
Pt placed in hospital bed for comfort; Admitting MD at bedside

## 2015-12-11 NOTE — ED Notes (Signed)
Pt initially refusing MRI scan; per tech, scan lasts 7 mins. Pt willing to continue with scan at this time; will transport to [facility]M after MRI

## 2015-12-11 NOTE — ED Notes (Signed)
Pt taken to MRI  

## 2015-12-12 ENCOUNTER — Other Ambulatory Visit: Payer: Self-pay | Admitting: *Deleted

## 2015-12-12 LAB — GLUCOSE, CAPILLARY
GLUCOSE-CAPILLARY: 109 mg/dL — AB (ref 65–99)
Glucose-Capillary: 98 mg/dL (ref 65–99)
Glucose-Capillary: 121 mg/dL — ABNORMAL HIGH (ref 65–99)

## 2015-12-12 MED ORDER — ATORVASTATIN CALCIUM 80 MG PO TABS: 80.0000 mg | ORAL_TABLET | Freq: Every day | ORAL | Status: DC

## 2015-12-12 MED ORDER — ASPIRIN EC 81 MG PO TBEC: DELAYED_RELEASE_TABLET | ORAL | Status: AC

## 2015-12-12 MED ORDER — CLOPIDOGREL BISULFATE 75 MG PO TABS: 75.0000 mg | ORAL_TABLET | Freq: Every day | ORAL | Status: DC

## 2015-12-12 NOTE — Care Management Note (Signed)
Case Management Note  Patient Details  Name: [name] MRN: [id number] Date of Birth: [birth year]  Subjective/Objective:                    Action/Plan: Patient discharging home with self care. No further needs per CM.   Expected Discharge Date:                  Expected Discharge Plan:  Home/Self Care  In-House Referral:     Discharge planning Services     Post Acute Care Choice:    Choice offered to:     DME Arranged:    DME Agency:     HH Arranged:    HH Agency:     Status of Service:  Completed, signed off  Medicare Important Message Given:    Date Medicare IM Given:    Medicare IM give by:    Date Additional Medicare IM Given:    Additional Medicare Important Message give by:     If discussed at Long Length of Stay Meetings, dates discussed:    Additional Comments:  Kermit Balo, RN 12/12/2015, 1:48 PM

## 2015-12-12 NOTE — Patient Outreach (Signed)
Late entry from 12/11/15  Noted that member was readmitted to hospital for Cerebral infarct.  Hospital liaison notified, will restart transition of care program once member discharged.  Kemper Durie, BSN, [facility] Care Management  [facility] Care Manager [phone]

## 2015-12-12 NOTE — Progress Notes (Signed)
Patient ID: [name], male   DOB: August 27, 1961, 55 y.o.   MRN: [id number]   Subjective: Mr. Pulgarin is in high spirits. His right-sided pain has improved considerably. He's eager to go home and understands his anti-platelet and statin regimen.  Objective: Vital signs in last 24 hours: Filed Vitals:   12/11/15 2155 12/12/15 0201 12/12/15 0526 12/12/15 0924  BP: 114/76 102/67 102/72 117/75  Pulse: 79 88 84 82  Temp: 98 F (36.7 C) 98.6 F (37 C) 98.5 F (36.9 C) 98.1 F (36.7 C)  TempSrc: Oral Oral Oral Oral  Resp: 18 18 20 20   SpO2: 100% 100% 99% 100%   General: resting in bed comfortably, appropriately conversational Cardiac: regular rate and rhythm, 2/6 systolic murmur loudest at right upper sternal border, sternotomy incision appears to be healing well Pulm: breathing well, clear to auscultation bilaterally Abd: bowel sounds normal, soft, nondistended, non-tender Ext: warm and well perfused, without pedal edema Skin: no rash, hair, or nail changes Neuro: alert and oriented X3, cranial nerves II-XII grossly intact, moving all extremities well, sensation intact to light touch throughout  Medications: I have reviewed the patient's current medications. Scheduled Meds: . allopurinol  300 mg Oral Daily  . aspirin  300 mg Rectal Daily   Or  . aspirin  325 mg Oral Daily  . atorvastatin  80 mg Oral q1800  . clopidogrel  75 mg Oral Daily  . ferrous sulfate  325 mg Oral Q breakfast  . folic acid  1 mg Oral Daily  . furosemide  40 mg Oral Daily  . insulin aspart  0-9 Units Subcutaneous TID WC  . lisinopril  5 mg Oral Daily  . metoprolol tartrate  25 mg Oral BID   Continuous Infusions:  PRN Meds:.acetaminophen **OR** acetaminophen, oxyCODONE-acetaminophen, senna-docusate  Assessment/Plan:  Mr. Kegg is a 55 year old man who recently underwent CABG and bioprosthetic AVR replacement on 3/21 presenting with a small-vessel thalamic ischemic stroke.  Acute left thalamic  cerebrovascular accident: He had classic thalamic pain syndrome but this seems to have resolved overnight. I don't think this is an embolic stroke related to his recent aortic valve replacement because the involved area is such a small spot in the thalamus.  -Thanks for your help neurology -Notified Dr. Laneta Simmers of his recent admission -Continue aspirin 81mg  daily for 3 months -Started clopidogrel 75mg  daily -Increased atorvastatin to 80mg  daily  Coronary artery disease s/p 4V CABG and bioprosthetic aortic valve replacement for severe AI: Troponin was slightly elevated at 0.13 without any EKG changes on admission. He's not complainingof any chest pain. I've checked an EKG again today that looks unchanged. -Continue furosemide 40mg  daily -Continue metoprolol 25mg  twice daily -Continue lisinopril 5mg  daily  Type 2 diabetes: Well-controlled; last A1c 6.3 -Hold home metformin -Sensitive SSI  Intercritical gout: Not an issue. -Continue home allopurinol -Hold home colchicine  Dispo: Discharge to home today  The patient does have a current PCP (Quentin Angst, MD) and does need an [facility] hospital follow-up appointment after discharge.  The patient does not know have transportation limitations that hinder transportation to clinic appointments.  .Services Needed at time of discharge: Y = Yes, Blank = No PT:   OT:   RN:   Equipment:   Other:     LOS: 1 day   Selina Cooley, MD 12/12/2015, 10:14 AM

## 2015-12-12 NOTE — Evaluation (Signed)
Physical Therapy Evaluation Patient Details Name: [name] MRN: [id number] DOB: [birth year] Today's Date: 12/12/2015   History of Present Illness  Adm with a 2 day history of a burning sensation on the right side of his body and face which then progressed to numbness. MRI of the brain left thalamus small acute ischemic infarct; ? small SAH versus artifact within several cortical sulci of the left temporo-occipital region. PMHx- CABG 11/25/15, DM, gout    Clinical Impression  Patient evaluated by Physical Therapy with no further acute PT needs identified. All education has been completed and the patient has no further questions.  Thank you for this referral.     Follow Up Recommendations No PT follow up    Equipment Recommendations  None recommended by PT    Recommendations for Other Services       Precautions / Restrictions Precautions Precautions: Sternal Precaution Comments: pt maintains precautions during mobility; he mentioned h/o using crutches when gout hits; recommended he use his RW due to sternum      Mobility  Bed Mobility Overal bed mobility: Independent                Transfers Overall transfer level: Independent Equipment used: None                Ambulation/Gait Ambulation/Gait assistance: Modified independent (Device/Increase time) Ambulation Distance (Feet): 300 Feet Assistive device: Straight cane Gait Pattern/deviations: WFL(Within Functional Limits) Gait velocity: slow and able to incr significantly  Gait velocity interpretation: at or above normal speed for age/gender General Gait Details: slightly antalgic due to recent vein harvesting  Stairs Stairs: Yes Stairs assistance: Modified independent (Device/Increase time) Stair Management: One rail Right;Alternating pattern;Forwards Number of Stairs: 5    Wheelchair Mobility    Modified Rankin (Stroke Patients Only) Modified Rankin (Stroke Patients Only) Pre-Morbid Rankin Score:  Moderate disability (due to CABG) Modified Rankin: Moderate disability     Balance Overall balance assessment: Modified Independent                               Standardized Balance Assessment Standardized Balance Assessment : Berg Balance Test;Dynamic Gait Index Berg Balance Test Sit to Stand: Able to stand without using hands and stabilize independently Standing Unsupported: Able to stand safely 2 minutes Sitting with Back Unsupported but Feet Supported on Floor or Stool: Able to sit safely and securely 2 minutes Stand to Sit: Sits safely with minimal use of hands Transfers: Able to transfer safely, minor use of hands Standing Unsupported with Eyes Closed: Able to stand 10 seconds safely Standing Ubsupported with Feet Together: Able to place feet together independently and stand 1 minute safely From Standing, Reach Forward with Outstretched Arm: Can reach confidently >25 cm (10") From Standing Position, Pick up Object from Floor: Able to pick up shoe safely and easily From Standing Position, Turn to Look Behind Over each Shoulder: Turn sideways only but maintains balance Turn 360 Degrees: Able to turn 360 degrees safely in 4 seconds or less Standing Unsupported, Alternately Place Feet on Step/Stool: Able to stand independently and safely and complete 8 steps in 20 seconds Standing Unsupported, One Foot in Front: Able to plae foot ahead of the other independently and hold 30 seconds Standing on One Leg: Tries to lift leg/unable to hold 3 seconds but remains standing independently Total Score: 50 Dynamic Gait Index Level Surface: Mild Impairment Change in Gait Speed: Normal Gait with Horizontal Head Turns:  Moderate Impairment Gait with Vertical Head Turns: Mild Impairment Gait and Pivot Turn: Normal Step Over Obstacle: Mild Impairment Step Around Obstacles: Normal Steps: Mild Impairment Total Score: 18       Pertinent Vitals/Pain Pain Assessment: No/denies pain     Home Living Family/patient expects to be discharged to:: Private residence Living Arrangements: Spouse/significant other Available Help at Discharge: Family;Available PRN/intermittently Type of Home: House Home Access: Stairs to enter Entrance Stairs-Rails: Can reach both Entrance Stairs-Number of Steps: 5 Home Layout: One level Home Equipment: Cane - single point;Crutches;Walker - 2 wheels;Grab bars - tub/shower      Prior Function Level of Independence: Independent with assistive device(s)         Comments: on disability; using cane x 3 yrs since gout flare up     Hand Dominance   Dominant Hand: Right    Extremity/Trunk Assessment   Upper Extremity Assessment: Overall WFL for tasks assessed           Lower Extremity Assessment: Overall WFL for tasks assessed (strength, sensation, coordination)      Cervical / Trunk Assessment: Normal;Other exceptions  Communication   Communication: No difficulties  Cognition Arousal/Alertness: Awake/alert Behavior During Therapy: WFL for tasks assessed/performed Overall Cognitive Status: Within Functional Limits for tasks assessed                      General Comments      Exercises        Assessment/Plan    PT Assessment Patent does not need any further PT services  PT Diagnosis Difficulty walking   PT Problem List    PT Treatment Interventions     PT Goals (Current goals can be found in the Care Plan section) Acute Rehab PT Goals PT Goal Formulation: All assessment and education complete, DC therapy    Frequency     Barriers to discharge        Co-evaluation               End of Session Equipment Utilized During Treatment: Gait belt Activity Tolerance: Patient tolerated treatment well Patient left: in chair;with call bell/phone within reach;with family/visitor present Nurse Communication: Mobility status         Time: 1114-1140 PT Time Calculation (min) (ACUTE ONLY): 26  min   Charges:   PT Evaluation $PT Eval Low Complexity: 1 Procedure PT Treatments $Therapeutic Activity: 8-22 mins   PT G Codes:        [name] 12/16/15, 1:31 PM Pager [phone]

## 2015-12-12 NOTE — Progress Notes (Signed)
Pt is being discharge home. Discharge instructions were given to patient and family 

## 2015-12-15 ENCOUNTER — Telehealth: Payer: Self-pay | Admitting: *Deleted

## 2015-12-15 ENCOUNTER — Other Ambulatory Visit: Payer: Self-pay | Admitting: *Deleted

## 2015-12-15 ENCOUNTER — Encounter: Payer: Self-pay | Admitting: *Deleted

## 2015-12-15 MED ORDER — FUROSEMIDE 40 MG PO TABS: 40.0000 mg | ORAL_TABLET | Freq: Every day | ORAL | Status: DC

## 2015-12-15 MED ORDER — POTASSIUM CHLORIDE CRYS ER 20 MEQ PO TBCR: 20.0000 meq | EXTENDED_RELEASE_TABLET | Freq: Every day | ORAL | Status: DC

## 2015-12-15 NOTE — Telephone Encounter (Signed)
Maxine Glenn, [facility] nurse called on patients behalf to request a refill on furosemide and potassium be sent to [facility]. Patient has never been seen in the office. Please advise. Thanks, MI

## 2015-12-15 NOTE — Telephone Encounter (Signed)
Okay to refill  lasix and potassium for a month, can do longer term after follow up appt, pt was seen by cardiology in the hospital, has a post hospital appointment with Carlean Jews, PA 12/24/15

## 2015-12-15 NOTE — Patient Outreach (Signed)
[facility]) Care Management   12/15/2015  Azim Gillingham [birth year] [id number]  [name] is an 55 y.o. male  Subjective:   Member states that he is "doing good."  He denies any pain, shortness of breath and states that his right side numbness and "heat" sensation has been relieved since being in the hospital.  He reports taking all medications as prescribed without problems, but only has a week worth of Furosemide and Klor-Con.  Objective:   Review of Systems  Constitutional: Negative.   HENT: Negative.   Eyes: Negative.   Respiratory: Negative.   Cardiovascular: Negative.   Gastrointestinal: Negative.   Genitourinary: Negative.   Musculoskeletal: Negative.   Skin: Negative.   Neurological: Negative.   Endo/Heme/Allergies: Negative.   Psychiatric/Behavioral: Negative.     Physical Exam  Constitutional: He is oriented to person, place, and time. He appears well-developed and well-nourished.  Neck: Normal range of motion.  Cardiovascular: Normal rate and regular rhythm.   Murmur heard. Respiratory: Effort normal and breath sounds normal.  GI: Soft. Bowel sounds are normal.  Musculoskeletal: Normal range of motion.  Neurological: He is alert and oriented to person, place, and time.  Skin: Skin is warm and dry.    BP 118/72 mmHg  Pulse 76  Resp 18  Ht 1.651 m ('5\' 5"'$ )  Wt 205 lb (92.987 kg)  BMI 34.11 kg/m2  SpO2 97%   Encounter Medications:   Outpatient Encounter Prescriptions as of 12/15/2015  Medication Sig Note  . allopurinol (ZYLOPRIM) 300 MG tablet Take 1 tablet (300 mg total) by mouth daily.   Marland Kitchen aspirin EC 81 MG tablet Take until July 7   . atorvastatin (LIPITOR) 80 MG tablet Take 1 tablet (80 mg total) by mouth daily at 6 PM.   . clopidogrel (PLAVIX) 75 MG tablet Take 1 tablet (75 mg total) by mouth daily.   . ferrous sulfate 325 (65 FE) MG tablet Take 1 tablet (325 mg total) by mouth daily with breakfast.   . folic acid (FOLVITE) 1 MG  tablet Take 1 tablet (1 mg total) by mouth daily.   Marland Kitchen lisinopril (PRINIVIL,ZESTRIL) 5 MG tablet Take 1 tablet (5 mg total) by mouth daily.   . metFORMIN (GLUCOPHAGE) 500 MG tablet Take 1 tablet (500 mg total) by mouth 2 (two) times daily with a meal.   . metoprolol tartrate (LOPRESSOR) 25 MG tablet Take 1 tablet (25 mg total) by mouth 2 (two) times daily.   Marland Kitchen oxyCODONE-acetaminophen (PERCOCET/ROXICET) 5-325 MG tablet Take 1-2 tablets by mouth every 6 (six) hours as needed for severe pain.   . furosemide (LASIX) 40 MG tablet Take 1 tablet (40 mg total) by mouth daily. 12/15/2015: Need refill   . potassium chloride (K-DUR,KLOR-CON) 20 MEQ tablet Take 1 tablet (20 mEq total) by mouth daily. 12/15/2015: Need refill    No facility-administered encounter medications on file as of 12/15/2015.    Functional Status:   In your present state of health, do you have any difficulty performing the following activities: 12/15/2015 11/17/2015  Hearing? N N  Vision? N N  Difficulty concentrating or making decisions? N N  Walking or climbing stairs? N N  Dressing or bathing? N N  Doing errands, shopping? N N  Preparing Food and eating ? N -  Using the Toilet? N -  In the past six months, have you accidently leaked urine? N -  Do you have problems with loss of bowel control? N -  Managing your Medications?  N -  Managing your Finances? N -  Housekeeping or managing your Housekeeping? N -    Depression screen [facility] 2/9 12/15/2015 04/16/2015 02/24/2015  Decreased Interest 0 0 0  Down, Depressed, Hopeless 0 0 0  PHQ - 2 Score 0 0 0    Fall Risk  12/15/2015 04/16/2015 02/24/2015  Falls in the past year? No Yes Yes  Number falls in past yr: - 1 1  Injury with Fall? - Yes Yes  Risk for fall due to : - History of fall(s) -  Follow up - Education provided -    Assessment:    Met with member at scheduled time, significant other and other family members present.  Significant other assist with providing information  for assessment.  Member reports that he has been self monitoring his weights and blood sugars, verbalizes understanding of when to notify physician.  Encouraged to document for trends.  Member state he was told to monitor his fluid status, discharged on Lasix.  Heart failure zones discussed, member and significant other verbalize understanding.  Member has no signs/symptoms of infection.  He reports he is sticking to all restrictions, including no lifting.  He states he is increasing his activity and exercise as tolerated and is eager to start cardiac rehab.  Member and significant other deny concerns.  Encouraged to contact with questions, contact information provided.  Plan:   Will continue with transition of care calls next week.  [facility] CM Care Plan Problem One        Most Recent Value   Care Plan Problem One  Recent hospitalization    Role Documenting the Problem One  Care Management Coordinator   Care Plan for Problem One  Active   [facility] Long Term Goal (31-90 days)  Member will not be readmitted to hospital within the next 31 days   [facility] Long Term Goal Start Date  12/15/15 [Readmitted, goal date reset]   Interventions for Problem One Long Term Goal  Discussed with member the importance of following discharge instructions, including follow up appointments, medications, diet, and home health involvement, to decrease the risk of readmission   [facility] CM Short Term Goal #1 (0-30 days)  Member will not show any signs/symptoms of infection of chest incision   [facility] CM Short Term Goal #1 Start Date  12/15/15   Interventions for Short Term Goal #1  Discussed with member proper care for incision, signs/symptoms of infection (drainage, redness, itching, fever)   [facility] CM Short Term Goal #2 (0-30 days)  Member will report taking all medications as prescribed over the next 4 weeks   [facility] CM Short Term Goal #2 Start Date  12/15/15   Interventions for Short Term Goal #2  Medications reviewed, instructed to contact  physician for update prescriptions for low/missing medications (Fish oil, Folic Acid, Nexium, & colchicine)   [facility] CM Short Term Goal #3 (0-30 days)  Member will have follow up appointment with cardiologist within the next 2 weeks   [facility]N CM Short Term Goal #3 Start Date  12/15/15   Interventions for Short Tern Goal #3  Appointment with cardiolotist confirmed, transportation confirmed.  Discussed the importance of attending follow up appointment and its relation to decreasing risk of readmission     Valente David, BSN, [facility] Manager [phone]

## 2015-12-20 ENCOUNTER — Encounter: Payer: Self-pay | Admitting: *Deleted

## 2015-12-22 NOTE — Progress Notes (Signed)
Cardiology Office Note    Date:  12/24/2015   ID:  [name], DOB [birth year], MRN [id number]  PCP:  Dwayne Lewandowsky, MD  Cardiologist:  Dr. Elease Jones    Post hospital follow up  History of Present Illness:  [name] is a 55 y.o. male with a history of gout, DM, HTN, CAD s/p 4V CABG and bicuspid AV s/p bioprosthetic AVR (11/25/15), and recent CVA who presents to clinic for post hospital follow up.   He was admitted to [facility] 3/11-3/30/17 for new onset CHF and found to have severe AI 2/2 bicuspid AV as well as 3V CAD. He underwent 4V CABG and bicuspid AV s/p bioprosthetic AVR on 11/25/15. Post op course was complicated by afib and he was placed on amiodarone. This was subsequently discontinued due to prolonged QTc. He was discharged in good condition.  He was then readmitted from 4/5-12/12/15 for an acute left thalamic ischemic CVA. This was not felt to be embolic and was presumed to be from small vessel disease. He was placed on ASA 81mg  and Plavix with plans to stop ASA after 3 months. Statin was increased to high dose atorvastatin 80mg  daily.   Today he presents to clinic for follow up. His only complaint is diarrhea everyday. It is worse at night and is making it difficult to sleep. No recent Abx use. He is also having significant belching associated with a very bad taste in his mouth. He has been on Metformin for many years with no issues and no recent increase in dose. He has not tired anything for it. Nothing makes it better or worse.  No chest pain or SOB. No LE edema, orthopnea, PND. No dizziness or syncope. He has been compliant with all his medications.    Past Medical History  Diagnosis Date  . Hypertension   . Hypercholesteremia   . Baker's cyst of knee   . Type II diabetes mellitus (HCC)   . GERD (gastroesophageal reflux disease)   . Gouty arthritis     "real bad" (01/17/2013)    Past Surgical History  Procedure Laterality Date  . Finger amputation Right 1980's   3rd & 4th digits "got them mashed" (01/17/2013)  . Knee arthroscopy Right 1980's  . Cardiac catheterization N/A 11/19/2015    Procedure: Right/Left Heart Cath and Coronary Angiography;  Surgeon: Lyn Records, MD;  Location: [facility] INVASIVE CV LAB;  Service: Cardiovascular;  Laterality: N/A;  . Tee without cardioversion N/A 11/20/2015    Procedure: TRANSESOPHAGEAL ECHOCARDIOGRAM (TEE);  Surgeon: Laurey Morale, MD;  Location: [facility] ENDOSCOPY;  Service: Cardiovascular;  Laterality: N/A;  . Multiple extractions with alveoloplasty Bilateral 11/21/2015    Procedure: Extraction of tooth #'s 2,12 with alveoloplasty and gross debridement of remaining dentition;  Surgeon: Charlynne Pander, DDS;  Location: [facility] OR;  Service: Oral Surgery;  Laterality: Bilateral;  . Coronary artery bypass graft N/A 11/25/2015    Procedure: CORONARY ARTERY BYPASS GRAFTING (CABG)x4 LIMA-LAD; SVG-OM; SVG-RCA; SVG-DIAG;  Surgeon: Alleen Borne, MD;  Location: [facility] OR;  Service: Open Heart Surgery;  Laterality: N/A;  . Aortic valve replacement N/A 11/25/2015    Procedure: AORTIC VALVE REPLACEMENT (AVR) USING A MAGNA EASE ;  Surgeon: Alleen Borne, MD;  Location: [facility] OR;  Service: Open Heart Surgery;  Laterality: N/A;  . Tee without cardioversion N/A 11/25/2015    Procedure: TRANSESOPHAGEAL ECHOCARDIOGRAM (TEE);  Surgeon: Alleen Borne, MD;  Location[facility] OR;  Service: Open Heart Surgery;  Laterality: N/A;  Current Medications: Outpatient Prescriptions Prior to Visit  Medication Sig Dispense Refill  . allopurinol (ZYLOPRIM) 300 MG tablet Take 1 tablet (300 mg total) by mouth daily. 30 tablet 2  . aspirin EC 81 MG tablet Take until July 7 90 tablet 0  . atorvastatin (LIPITOR) 80 MG tablet Take 1 tablet (80 mg total) by mouth daily at 6 PM. 90 tablet 3  . clopidogrel (PLAVIX) 75 MG tablet Take 1 tablet (75 mg total) by mouth daily. 90 tablet 3  . ferrous sulfate 325 (65 FE) MG tablet Take 1 tablet (325 mg total) by mouth daily with  breakfast. 30 tablet 1  . folic acid (FOLVITE) 1 MG tablet Take 1 tablet (1 mg total) by mouth daily. 30 tablet 1  . furosemide (LASIX) 40 MG tablet Take 1 tablet (40 mg total) by mouth daily. 30 tablet 0  . lisinopril (PRINIVIL,ZESTRIL) 5 MG tablet Take 1 tablet (5 mg total) by mouth daily. 30 tablet 3  . metFORMIN (GLUCOPHAGE) 500 MG tablet Take 1 tablet (500 mg total) by mouth 2 (two) times daily with a meal. 60 tablet 2  . metoprolol tartrate (LOPRESSOR) 25 MG tablet Take 1 tablet (25 mg total) by mouth 2 (two) times daily. 60 tablet 1  . oxyCODONE-acetaminophen (PERCOCET/ROXICET) 5-325 MG tablet Take 1-2 tablets by mouth every 6 (six) hours as needed for severe pain. 30 tablet 0  . potassium chloride SA (K-DUR,KLOR-CON) 20 MEQ tablet Take 1 tablet (20 mEq total) by mouth daily. 30 tablet 0   No facility-administered medications prior to visit.     Allergies:   Other and Hydrocodone-acetaminophen   Social History   Social History  . Marital Status: Widowed    Spouse Name: N/A  . Number of Children: N/A  . Years of Education: N/A   Social History Main Topics  . Smoking status: Never Smoker   . Smokeless tobacco: Never Used  . Alcohol Use: 1.8 oz/week    3 Shots of liquor per week     Comment: ocassional   . Drug Use: Yes    Special: Marijuana     Comment: last weed was 1 week ago. 04/16/15  . Sexual Activity: Yes   Other Topics Concern  . None   Social History Narrative     Family History:  The patient's family history includes Diabetes in his father; Hypertension in his brother, brother, brother, father, mother, and sister.   ROS:   Please see the history of present illness.    ROS All other systems reviewed and are negative.   PHYSICAL EXAM:   VS:  BP 110/90 mmHg  Pulse 80  Ht 5\' 5"  (1.651 m)  Wt 204 lb (92.534 kg)  BMI 33.95 kg/m2   GEN: Well nourished, well developed, in no acute distress HEENT: normal Neck: no JVD, carotid bruits, or masses Cardiac: RRR;  +murmurs, rubs, or gallops,no edema  Respiratory:  clear to auscultation bilaterally, normal work of breathing GI: soft, nontender, nondistended, + BS MS: no deformity or atrophy Skin: warm and dry, no rash Neuro:  Alert and Oriented x 3, Strength and sensation are intact Psych: euthymic mood, full affect  Wt Readings from Last 3 Encounters:  12/24/15 204 lb (92.534 kg)  12/15/15 205 lb (92.987 kg)  12/10/15 203 lb (92.08 kg)      Studies/Labs Reviewed:   EKG:  EKG is ordered today.  The ekg ordered today demonstrates NSR, iRBBB, diffuse TW abnormalities her previous  Recent Labs: 11/15/2015:  B Natriuretic Peptide 1075.7*; TSH 3.027 11/26/2015: Magnesium 2.4 12/10/2015: ALT 9* 12/11/2015: BUN 14; Creatinine, Ser 1.08; Hemoglobin 10.1*; Platelets 413*; Potassium 4.1; Sodium 137   Lipid Panel    Component Value Date/Time   CHOL 147 12/11/2015 0609   TRIG 173* 12/11/2015 0609   HDL 27* 12/11/2015 0609   CHOLHDL 5.4 12/11/2015 0609   VLDL 35 12/11/2015 0609   LDLCALC 85 12/11/2015 0609    Additional studies/ records that were reviewed today include:   2D ECHO: 12/11/2015 LV EF: 60% - 65% Study Conclusions - Procedure narrative: Transthoracic echocardiography. Image  quality was adequate. The study was technically difficult, as a  result of restricted patient mobility. - Left ventricle: The cavity size was normal. Wall thickness was  increased in a pattern of severe LVH. Systolic function was  normal. The estimated ejection fraction was in the range of 60%  to 65%. Left ventricular diastolic function parameters were  normal. - Aortic valve: AV prostheisis is diffiluclt to see well Peak and  mean gradients through the valve are 22 and 11 mm Hg  respectively. There is no AI. - Right ventricle: The cavity size was mildly dilated. - Right atrium: The atrium was mildly dilated.     ASSESSMENT:    1. Cerebrovascular accident (CVA), unspecified mechanism (HCC)     2. CAD, multiple vessel   3. Bicuspid aortic valve   4. Essential hypertension   5. HLD (hyperlipidemia)   6. Dyslipidemia   7. Diarrhea, unspecified type      PLAN:  In order of problems listed above:  Left thalamic ischemic stroke: not felt to be consistent with an embolic stroke on imaging and was presumed to be small-vessel disease. Continue ASA and Plavix for 3 months followed by Plavix alone.   Coronary artery disease status-post CABG: stable. Continue aspirin  and clopidogrel  daily.   Bicuspid aortic valve s/p bovine AVR: A transthoracic echocardiogram showed normal trans-valvular aortic pressures without evidence of aortic insufficiency  HTN: BP well controlled on Lisinopril  daily  HLD: continue atorvastatin  daily.   Profuse watery diarrhea: recent antibiotic use but has been admitted twice in the past month. Per patient the diarrhea is watery, green profuse and associated with a foul smell. This has been going on since discharge on 12/12/15. I will order a C. Difficile test today. If this is negative he will need a referral to GI.  Medication Adjustments/Labs and Tests Ordered: Current medicines are reviewed at length with the patient today.  Concerns regarding medicines are outlined above.  Medication changes, Labs and Tests ordered today are listed in the Patient Instructions below. Patient Instructions  Medication Instructions:  Your physician recommends that you continue on your current medications as directed. Please refer to the Current Medication list given to you today.   Labwork: TODAY;  GO TO [facility] ON THE 1ST FLOOR AND GET STOOL CARD FOR CDIFF TEST  Testing/Procedures: None ordered  Follow-Up: Your physician recommends that you schedule a follow-up appointment in: 3-4 MONTHS WITH DR. Elease Jones   Any Other Special Instructions Will Be Listed Below (If Applicable).   If you need a refill on your cardiac medications before your next  appointment, please call your pharmacy.       [name]  12/24/2015 9:19 AM    [facility] [address] [place], [place], [place]  [address] Phone: [phone]; Fax: [phone]

## 2015-12-23 ENCOUNTER — Other Ambulatory Visit: Payer: Self-pay | Admitting: *Deleted

## 2015-12-23 NOTE — Patient Outreach (Signed)
Weekly transition of care call placed to member.  He states that he is "doing good."  He denies concerns at this time.  He continues to weigh himself daily, and check his blood sugar.  His weight today is 207 pounds, and he reports his blood sugar ranging in the low 100s (110s-130s).  He reports that he has obtained his Furosemide and Klor-Con from the pharmacy and has been taking it as prescribed.  He denies any chest pain or discomfort, or any signs/symptoms of another stroke.  He states he has a follow up appointment with his cardiologist tomorrow, denies needing transportation.  Encourage to contact with any concerns/questions.  Will continue with weekly transition of care calls next week.  Kemper Durie, BSN, [facility] Care Management  [facility] Care Manager [phone]

## 2015-12-24 ENCOUNTER — Encounter: Payer: Self-pay | Admitting: Physician Assistant

## 2015-12-24 ENCOUNTER — Ambulatory Visit (INDEPENDENT_AMBULATORY_CARE_PROVIDER_SITE_OTHER): Payer: Commercial Managed Care - HMO | Admitting: Physician Assistant

## 2015-12-24 VITALS — BP 110/90 | HR 80 | Ht 65.0 in | Wt 204.0 lb

## 2015-12-24 DIAGNOSIS — I351 Nonrheumatic aortic (valve) insufficiency: Secondary | ICD-10-CM

## 2015-12-24 DIAGNOSIS — I2510 Atherosclerotic heart disease of native coronary artery without angina pectoris: Secondary | ICD-10-CM

## 2015-12-24 DIAGNOSIS — Q231 Congenital insufficiency of aortic valve: Secondary | ICD-10-CM

## 2015-12-24 DIAGNOSIS — I10 Essential (primary) hypertension: Secondary | ICD-10-CM | POA: Diagnosis not present

## 2015-12-24 DIAGNOSIS — Z951 Presence of aortocoronary bypass graft: Secondary | ICD-10-CM | POA: Diagnosis not present

## 2015-12-24 DIAGNOSIS — I639 Cerebral infarction, unspecified: Secondary | ICD-10-CM

## 2015-12-24 DIAGNOSIS — E785 Hyperlipidemia, unspecified: Secondary | ICD-10-CM

## 2015-12-24 DIAGNOSIS — R197 Diarrhea, unspecified: Secondary | ICD-10-CM

## 2015-12-24 NOTE — Patient Instructions (Addendum)
Medication Instructions:  Your physician recommends that you continue on your current medications as directed. Please refer to the Current Medication list given to you today.   Labwork: TODAY;  GO TO [facility] ON THE 1ST FLOOR AND GET STOOL CARD FOR CDIFF TEST  Testing/Procedures: None ordered  Follow-Up: Your physician recommends that you schedule a follow-up appointment in: 3-4 MONTHS WITH DR. Elease Hashimoto   Any Other Special Instructions Will Be Listed Below (If Applicable).   If you need a refill on your cardiac medications before your next appointment, please call your pharmacy.

## 2015-12-27 LAB — CLOSTRIDIUM DIFFICILE EIA: C difficile Toxins A+B, EIA: NEGATIVE

## 2015-12-30 ENCOUNTER — Other Ambulatory Visit: Payer: Self-pay | Admitting: Surgery

## 2015-12-30 DIAGNOSIS — Z951 Presence of aortocoronary bypass graft: Secondary | ICD-10-CM

## 2015-12-31 ENCOUNTER — Other Ambulatory Visit: Payer: Self-pay | Admitting: *Deleted

## 2015-12-31 ENCOUNTER — Encounter: Payer: Self-pay | Admitting: Surgery

## 2015-12-31 ENCOUNTER — Ambulatory Visit: Payer: Self-pay | Admitting: Surgery

## 2015-12-31 ENCOUNTER — Encounter: Payer: Self-pay | Admitting: *Deleted

## 2015-12-31 NOTE — Patient Outreach (Signed)
Weekly transition of care call placed to member, no answer.  HIPPA compliant voice message left.  Will await call back.  Sayuri Rhames, BSN, PCCN [facility] Care Management  Community Care Manager [phone]  

## 2015-12-31 NOTE — Progress Notes (Unsigned)
This encounter was created in error - please disregard.  This encounter was created in error - please disregard.

## 2015-12-31 NOTE — Progress Notes (Signed)
This encounter was created in error - please disregard.

## 2016-01-02 ENCOUNTER — Ambulatory Visit
Admission: RE | Admit: 2016-01-02 | Discharge: 2016-01-02 | Disposition: A | Discharge status: Home / Self Care | Payer: Commercial Managed Care - HMO | Source: Ambulatory Visit | Attending: Surgery | Admitting: Surgery

## 2016-01-02 DIAGNOSIS — Z951 Presence of aortocoronary bypass graft: Secondary | ICD-10-CM

## 2016-01-05 ENCOUNTER — Other Ambulatory Visit: Payer: Self-pay | Admitting: Thoracic Surgery (Cardiothoracic Vascular Surgery)

## 2016-01-05 ENCOUNTER — Other Ambulatory Visit: Payer: Self-pay | Admitting: Surgery

## 2016-01-05 DIAGNOSIS — Z951 Presence of aortocoronary bypass graft: Secondary | ICD-10-CM

## 2016-01-06 ENCOUNTER — Encounter: Payer: Self-pay | Admitting: Surgery

## 2016-01-06 ENCOUNTER — Ambulatory Visit (INDEPENDENT_AMBULATORY_CARE_PROVIDER_SITE_OTHER): Payer: Self-pay | Admitting: Surgery

## 2016-01-06 VITALS — BP 124/88 | HR 90 | Resp 20 | Ht 65.0 in | Wt 209.0 lb

## 2016-01-06 DIAGNOSIS — Z951 Presence of aortocoronary bypass graft: Secondary | ICD-10-CM

## 2016-01-06 DIAGNOSIS — Z952 Presence of prosthetic heart valve: Secondary | ICD-10-CM

## 2016-01-06 DIAGNOSIS — Z954 Presence of other heart-valve replacement: Secondary | ICD-10-CM

## 2016-01-06 MED ORDER — OXYCODONE-ACETAMINOPHEN 5-325 MG PO TABS
1.0000 | ORAL_TABLET | Freq: Four times a day (QID) | ORAL | Status: DC | PRN
Start: 2016-01-06 — End: 2016-04-01

## 2016-01-06 NOTE — Progress Notes (Signed)
HPI: Patient returns for routine postoperative follow-up having undergone CABG x 4 and AVR with a pericardial valve  on 11/25/2015. The patient's early postoperative recovery while in the hospital was notable for an uncomplicated postop course. Since hospital discharge the patient reports that he was readmitted with an acute left thalamic ischemic stroke with numbness on the right side of his body. This was not felt to be embolic. An echo showed normal prosthetic aortic valve function with no thrombus within the heart. He was discharged on ASA and Plavix and his symptoms have completely resolved. He has also had some diarrhea and had a stool sample sent for C. Diff which was negative. He thinks the diarrhea may be due to metformin which he was on preop. He had some diarrhea after starting it in the past which resolved with time.   He feels much better overall and is walking daily without shortness of breath or chest pain. He has had no LE edema.   Current Outpatient Prescriptions  Medication Sig Dispense Refill  . allopurinol (ZYLOPRIM) 300 MG tablet Take 1 tablet (300 mg total) by mouth daily. 30 tablet 2  . aspirin EC 81 MG tablet Take until July 7 90 tablet 0  . atorvastatin (LIPITOR) 80 MG tablet Take 1 tablet (80 mg total) by mouth daily at 6 PM. 90 tablet 3  . clopidogrel (PLAVIX) 75 MG tablet Take 1 tablet (75 mg total) by mouth daily. 90 tablet 3  . ferrous sulfate 325 (65 FE) MG tablet Take 1 tablet (325 mg total) by mouth daily with breakfast. 30 tablet 1  . folic acid (FOLVITE) 1 MG tablet Take 1 tablet (1 mg total) by mouth daily. 30 tablet 1  . furosemide (LASIX) 40 MG tablet Take 1 tablet (40 mg total) by mouth daily. 30 tablet 0  . lisinopril (PRINIVIL,ZESTRIL) 5 MG tablet Take 1 tablet (5 mg total) by mouth daily. 30 tablet 3  . metFORMIN (GLUCOPHAGE) 500 MG tablet Take 1 tablet (500 mg total) by mouth 2 (two) times daily with a meal. 60 tablet 2  . metoprolol tartrate  (LOPRESSOR) 25 MG tablet Take 1 tablet (25 mg total) by mouth 2 (two) times daily. 60 tablet 1  . oxyCODONE-acetaminophen (PERCOCET/ROXICET) 5-325 MG tablet Take 1-2 tablets by mouth every 6 (six) hours as needed for severe pain. 40 tablet 0  . potassium chloride SA (K-DUR,KLOR-CON) 20 MEQ tablet Take 1 tablet (20 mEq total) by mouth daily. 30 tablet 0   No current facility-administered medications for this visit.    Physical Exam: BP 124/88 mmHg  Pulse 90  Resp 20  Ht 5\' 5"  (1.651 m)  Wt 209 lb (94.802 kg)  BMI 34.78 kg/m2  SpO2 98% He looks well. Lung exam is clear. Cardiac exam shows a regular rate and rhythm with normal heart sounds. Chest incision is healing well and sternum is stable. The leg incisions are healing well and there is no peripheral edema.   Diagnostic Tests:  CLINICAL DATA: Status post coronary bypass grafting 1 month ago  EXAM: CHEST 2 VIEW  COMPARISON: 12/01/2015  FINDINGS: Cardiac shadow is stable. Postsurgical changes are noted. Lungs are clear. No sizable infiltrate or effusion is seen. No bony abnormality is noted.  IMPRESSION: No acute abnormality seen.   Electronically Signed  By: Alcide Clever M.D.  On: 01/02/2016 14:07   Impression:  Overall I think he is doing well. I encouraged him to continue walking.  I told him  he could drive his car but should not lift anything heavier than 10 lbs for three months postop. He may need to get off the metformin if his diarrhea does not stop but he should discuss that with his PCP.   Plan:  He will continue to follow up with his PCP and with cardiology and will return to see me if there are any problems with his incisions.   Alleen Borne, MD [facility] [phone]

## 2016-01-07 ENCOUNTER — Other Ambulatory Visit: Payer: Self-pay | Admitting: *Deleted

## 2016-01-07 NOTE — Patient Outreach (Signed)
Weekly transition of care call placed to member.  He states that he is doing well and denies concerns at this time.  He reports that his follow up appointments were "good" and the physicians voiced no concerns.  He denies any chest pain or any signs/symptoms of stroke.  He reports his weights and blood sugars being "steady" with his weight being 204 pounds today and blood sugar of low 100s.  He state that he has been taking all of his medications as prescribed, denies questions.  Encouraged to contact this care manager with any concerns.  Will follow up with member next week to continue transition of care.  Kemper Durie, BSN, [facility] Care Management  [facility] Care Manager [phone]

## 2016-01-08 ENCOUNTER — Encounter: Payer: Self-pay | Admitting: *Deleted

## 2016-01-16 ENCOUNTER — Encounter: Payer: Self-pay | Admitting: *Deleted

## 2016-01-16 ENCOUNTER — Other Ambulatory Visit: Payer: Self-pay | Admitting: *Deleted

## 2016-01-16 NOTE — Patient Outreach (Addendum)
Weekly transition of care call placed to member.  He state he is doing "really good.  I been doing good.  I haven't had any pain or anything."  He reports taking all medications as prescribed, and has been monitoring his weights and blood sugars (today 204 pounds and 112 blood sugar).  He denies any chest pain/discomfort or any signs/symptoms of stroke.  He does express concern regarding getting his Allopurinol refilled, stating that he has called his physician office for the refill without success.    Call placed to PCP office to request refill, spoke to medical assistant, she state the member has not been seen by his PCP since August (only follow ups with cardiology and cardiac surgeon).  She state that he will need to be seen for an assessment prior to the refill.  Call placed back to member to inform him of the need to schedule an appointment.  Also discussed the importance of seeing his PCP on a regular basis.  He verbalizes understanding.  Made aware that the transition of care program has completed, all goals met.  He denies the need for any further assistance from this care manager.  Encouraged to contact [facility] care management services should his needs change in the future.  Also informed that he will still have access to the 24 hour nurse triage line.  Will notify care management assistant and PCP of case closure.  [name], BSN, [place] Management  [facility] Care Manager [phone]

## 2016-01-28 ENCOUNTER — Encounter: Payer: Self-pay | Admitting: *Deleted

## 2016-02-11 ENCOUNTER — Telehealth: Payer: Self-pay | Admitting: Internal Medicine

## 2016-02-11 NOTE — Telephone Encounter (Signed)
[name] from [facility] is requesting a sliver back referral  States she needs the referral today  Please assist. Thank you

## 2016-02-11 NOTE — Telephone Encounter (Signed)
Done  [facility]  Outpatient Authorization #[id number]   6-7 / 08-09-16

## 2016-02-12 ENCOUNTER — Ambulatory Visit: Payer: Self-pay | Admitting: Neurology

## 2016-02-26 ENCOUNTER — Encounter: Payer: Self-pay | Admitting: Physician Assistant

## 2016-02-26 ENCOUNTER — Other Ambulatory Visit: Payer: Self-pay | Admitting: Physician Assistant

## 2016-02-26 ENCOUNTER — Ambulatory Visit: Payer: Commercial Managed Care - HMO | Attending: Physician Assistant | Admitting: Physician Assistant

## 2016-02-26 ENCOUNTER — Other Ambulatory Visit: Payer: Self-pay | Admitting: Internal Medicine

## 2016-02-26 VITALS — BP 158/107 | HR 84 | Temp 98.0°F | Wt 219.0 lb

## 2016-02-26 DIAGNOSIS — E119 Type 2 diabetes mellitus without complications: Secondary | ICD-10-CM | POA: Insufficient documentation

## 2016-02-26 DIAGNOSIS — M10061 Idiopathic gout, right knee: Secondary | ICD-10-CM | POA: Insufficient documentation

## 2016-02-26 DIAGNOSIS — R030 Elevated blood-pressure reading, without diagnosis of hypertension: Secondary | ICD-10-CM | POA: Insufficient documentation

## 2016-02-26 DIAGNOSIS — E7800 Pure hypercholesterolemia, unspecified: Secondary | ICD-10-CM | POA: Insufficient documentation

## 2016-02-26 DIAGNOSIS — Z7982 Long term (current) use of aspirin: Secondary | ICD-10-CM | POA: Insufficient documentation

## 2016-02-26 DIAGNOSIS — M1000 Idiopathic gout, unspecified site: Secondary | ICD-10-CM

## 2016-02-26 DIAGNOSIS — M10071 Idiopathic gout, right ankle and foot: Secondary | ICD-10-CM | POA: Insufficient documentation

## 2016-02-26 DIAGNOSIS — K219 Gastro-esophageal reflux disease without esophagitis: Secondary | ICD-10-CM | POA: Insufficient documentation

## 2016-02-26 DIAGNOSIS — Z7984 Long term (current) use of oral hypoglycemic drugs: Secondary | ICD-10-CM | POA: Insufficient documentation

## 2016-02-26 DIAGNOSIS — Z79899 Other long term (current) drug therapy: Secondary | ICD-10-CM | POA: Insufficient documentation

## 2016-02-26 MED ORDER — ALLOPURINOL 300 MG PO TABS: 300.0000 mg | ORAL_TABLET | Freq: Every day | ORAL | Status: DC

## 2016-02-26 MED ORDER — COLCHICINE 0.6 MG PO TABS: 0.6000 mg | ORAL_TABLET | Freq: Two times a day (BID) | ORAL | Status: DC

## 2016-02-26 MED ORDER — CLONIDINE HCL 0.2 MG PO TABS
0.2000 mg | ORAL_TABLET | Freq: Once | ORAL | Status: AC
  Administered 2016-02-26: 0.2 mg via ORAL

## 2016-02-26 MED ORDER — KETOROLAC TROMETHAMINE 60 MG/2ML IM SOLN
60.0000 mg | Freq: Once | INTRAMUSCULAR | Status: AC
  Administered 2016-02-26: 60 mg via INTRAMUSCULAR

## 2016-02-26 NOTE — Patient Instructions (Signed)
Increase water intake Gout Gout is an inflammatory arthritis caused by a buildup of uric acid crystals in the joints. Uric acid is a chemical that is normally present in the blood. When the level of uric acid in the blood is too high it can form crystals that deposit in your joints and tissues. This causes joint redness, soreness, and swelling (inflammation). Repeat attacks are common. Over time, uric acid crystals can form into masses (tophi) near a joint, destroying bone and causing disfigurement. Gout is treatable and often preventable. CAUSES  The disease begins with elevated levels of uric acid in the blood. Uric acid is produced by your body when it breaks down a naturally found substance called purines. Certain foods you eat, such as meats and fish, contain high amounts of purines. Causes of an elevated uric acid level include:  Being passed down from parent to child (heredity).  Diseases that cause increased uric acid production (such as obesity, psoriasis, and certain cancers).  Excessive alcohol use.  Diet, especially diets rich in meat and seafood.  Medicines, including certain cancer-fighting medicines (chemotherapy), water pills (diuretics), and aspirin.  Chronic kidney disease. The kidneys are no longer able to remove uric acid well.  Problems with metabolism. Conditions strongly associated with gout include:  Obesity.  High blood pressure.  High cholesterol.  Diabetes. Not everyone with elevated uric acid levels gets gout. It is not understood why some people get gout and others do not. Surgery, joint injury, and eating too much of certain foods are some of the factors that can lead to gout attacks. SYMPTOMS   An attack of gout comes on quickly. It causes intense pain with redness, swelling, and warmth in a joint.  Fever can occur.  Often, only one joint is involved. Certain joints are more commonly involved:  Base of the big  toe.  Knee.  Ankle.  Wrist.  Finger. Without treatment, an attack usually goes away in a few days to weeks. Between attacks, you usually will not have symptoms, which is different from many other forms of arthritis. DIAGNOSIS  Your caregiver will suspect gout based on your symptoms and exam. In some cases, tests may be recommended. The tests may include:  Blood tests.  Urine tests.  X-rays.  Joint fluid exam. This exam requires a needle to remove fluid from the joint (arthrocentesis). Using a microscope, gout is confirmed when uric acid crystals are seen in the joint fluid. TREATMENT  There are two phases to gout treatment: treating the sudden onset (acute) attack and preventing attacks (prophylaxis).  Treatment of an Acute Attack.  Medicines are used. These include anti-inflammatory medicines or steroid medicines.  An injection of steroid medicine into the affected joint is sometimes necessary.  The painful joint is rested. Movement can worsen the arthritis.  You may use warm or cold treatments on painful joints, depending which works best for you.  Treatment to Prevent Attacks.  If you suffer from frequent gout attacks, your caregiver may advise preventive medicine. These medicines are started after the acute attack subsides. These medicines either help your kidneys eliminate uric acid from your body or decrease your uric acid production. You may need to stay on these medicines for a very long time.  The early phase of treatment with preventive medicine can be associated with an increase in acute gout attacks. For this reason, during the first few months of treatment, your caregiver may also advise you to take medicines usually used for acute gout treatment.  Be sure you understand your caregiver's directions. Your caregiver may make several adjustments to your medicine dose before these medicines are effective.  Discuss dietary treatment with your caregiver or dietitian.  Alcohol and drinks high in sugar and fructose and foods such as meat, poultry, and seafood can increase uric acid levels. Your caregiver or dietitian can advise you on drinks and foods that should be limited. HOME CARE INSTRUCTIONS   Do not take aspirin to relieve pain. This raises uric acid levels.  Only take over-the-counter or prescription medicines for pain, discomfort, or fever as directed by your caregiver.  Rest the joint as much as possible. When in bed, keep sheets and blankets off painful areas.  Keep the affected joint raised (elevated).  Apply warm or cold treatments to painful joints. Use of warm or cold treatments depends on which works best for you.  Use crutches if the painful joint is in your leg.  Drink enough fluids to keep your urine clear or pale yellow. This helps your body get rid of uric acid. Limit alcohol, sugary drinks, and fructose drinks.  Follow your dietary instructions. Pay careful attention to the amount of protein you eat. Your daily diet should emphasize fruits, vegetables, whole grains, and fat-free or low-fat milk products. Discuss the use of coffee, vitamin C, and cherries with your caregiver or dietitian. These may be helpful in lowering uric acid levels.  Maintain a healthy body weight. SEEK MEDICAL CARE IF:   You develop diarrhea, vomiting, or any side effects from medicines.  You do not feel better in 24 hours, or you are getting worse. SEEK IMMEDIATE MEDICAL CARE IF:   Your joint becomes suddenly more tender, and you have chills or a fever. MAKE SURE YOU:   Understand these instructions.  Will watch your condition.  Will get help right away if you are not doing well or get worse.   This information is not intended to replace advice given to you by your health care provider. Make sure you discuss any questions you have with your health care provider.   Document Released: 08/20/2000 Document Revised: 09/13/2014 Document Reviewed:  04/05/2012 Elsevier Interactive Patient Education [employer]

## 2016-02-26 NOTE — Progress Notes (Signed)
Patient ID: [name], male   DOB: [birth year], 55 y.o.   MRN: [id number]   [name], is a 55 y.o. male  [id number]  [id number]  DOB - Nov 09, 1960  Chief Complaint  Patient presents with  . Gout    flare x 3 dys Right knee,leg, ankle, foot        Subjective:  Chief Complaint and HPI: [name] is a 55 y.o. male here today for gout flare R knee and ankle.  Feels like previous attacks. Had CABG in March of this year and was advised not to take indomethacin during that time period.  He believes the attack was precipitated by some spices on food he ate. Says BP is up today due to pain.  Sees cardiologist again next in a couple of weeks.  He has taken colchicine for gout flares without any problems and is on allopurinol daily.   ED/Hospital notes reviewed around time of MI    ROS:   Constitutional:  No f/c, No night sweats, No unexplained weight loss. EENT:  No vision changes, No blurry vision, No hearing changes. No mouth, throat, or ear problems.  Respiratory: No cough, No SOB Cardiac: No CP, no palpitations GI:  No abd pain, No N/V/D. GU: No Urinary s/sx Musculoskeletal: +right knee, ankle, and leg pain.  Neuro: No headache, no dizziness, no motor weakness or other s/sx of stroke Skin: No rash Endocrine:  No polydipsia. No polyuria.  Psych: Denies SI/HI  No problems updated.  ALLERGIES: Allergies  Allergen Reactions  . Other Anaphylaxis    mushrooms  . Hydrocodone-Acetaminophen Itching    PAST MEDICAL HISTORY: Past Medical History  Diagnosis Date  . Hypertension   . Hypercholesteremia   . Baker's cyst of knee   . Type II diabetes mellitus (HCC)   . GERD (gastroesophageal reflux disease)   . Gouty arthritis     "real bad" (01/17/2013)    MEDICATIONS AT HOME: Prior to Admission medications   Medication Sig Start Date End Date Taking? Authorizing Provider  allopurinol (ZYLOPRIM) 300 MG tablet Take 1 tablet (300 mg total) by mouth daily. 02/26/16   Yes Anders Simmonds, PA-C  aspirin EC 81 MG tablet Take until July 7 12/12/15 03/12/16 Yes Selina Cooley, MD  atorvastatin (LIPITOR) 80 MG tablet Take 1 tablet (80 mg total) by mouth daily at 6 PM. 12/12/15  Yes Selina Cooley, MD  clopidogrel (PLAVIX) 75 MG tablet Take 1 tablet (75 mg total) by mouth daily. 12/12/15  Yes Selina Cooley, MD  ferrous sulfate 325 (65 FE) MG tablet Take 1 tablet (325 mg total) by mouth daily with breakfast. 12/01/15  Yes Wayne E Gold, PA-C  folic acid (FOLVITE) 1 MG tablet Take 1 tablet (1 mg total) by mouth daily. 12/01/15  Yes Wayne E Gold, PA-C  furosemide (LASIX) 40 MG tablet Take 1 tablet (40 mg total) by mouth daily. 12/15/15  Yes Janetta Hora, PA-C  lisinopril (PRINIVIL,ZESTRIL) 5 MG tablet Take 1 tablet (5 mg total) by mouth daily. 12/04/15  Yes Erin R Barrett, PA-C  metFORMIN (GLUCOPHAGE) 500 MG tablet Take 1 tablet (500 mg total) by mouth 2 (two) times daily with a meal. 12/04/15  Yes Donielle Margaretann Loveless, PA-C  metoprolol tartrate (LOPRESSOR) 25 MG tablet Take 1 tablet (25 mg total) by mouth 2 (two) times daily. 12/01/15  Yes Wayne E Gold, PA-C  potassium chloride SA (K-DUR,KLOR-CON) 20 MEQ tablet Take 1 tablet (20 mEq total) by mouth daily. 12/15/15  Yes Samara Deist  R Thompson, PA-C  colchicine 0.6 MG tablet Take 1 tablet (0.6 mg total) by mouth 2 (two) times daily. Prn gout flare 02/26/16   Anders Simmonds, PA-C  oxyCODONE-acetaminophen (PERCOCET/ROXICET) 5-325 MG tablet Take 1-2 tablets by mouth every 6 (six) hours as needed for severe pain. Patient not taking: Reported on 02/26/2016 01/06/16   Alleen Borne, MD     Objective:  Dwayne GreavesCeasar Jones Vitals:   02/26/16 1136  BP: 179/116  Pulse: 102  Temp: 98 F (36.7 C)  TempSrc: Oral  Weight: 219 lb (99.338 kg)    General appearance : A&OX3. NAD. Non-toxic-appearing HEENT: Atraumatic and Normocephalic.   Mouth-MMM Neck: supple, no JVD. No cervical lymphadenopathy. No thyromegaly Chest/Lungs:  Breathing-non-labored,  Good air entry bilaterally, breath sounds normal without rales, rhonchi, or wheezing  CVS: S1 S2 regular, no gallops, rubs, 2/6 murmur present Abdomen: Bowel sounds present, Non tender and not distended with no gaurding, rigidity or rebound. Extremities: Bilateral Lower Ext shows no edema, both legs are warm to touch with = pulse throughout.  R knee and ankle TTP.  Unable to perform full exam secondary to pain.  No calf TTP, no erythema of the calf or swelling of the calf.  Neurology:  CN II-XII grossly intact, Non focal.   Psych:  TP linear. J/I WNL. Normal speech. Appropriate eye contact and affect.  Skin:  No Rash  Data Review Lab Results  Component Value Date   HGBA1C 6.3* 11/15/2015   HGBA1C 6.2% 02/24/2015   HGBA1C 5.4 05/02/2013     Assessment & Plan   1. Idiopathic gout, unspecified chronicity, R knee and ankle - allopurinol (ZYLOPRIM) 300 MG tablet; Take 1 tablet (300 mg total) by mouth daily.  Dispense: 30 tablet; Refill: 2 - ketorolac (TORADOL) injection 60 mg; Inject 2 mLs (60 mg total) into the muscle once. Colchicine 0.6mg  bid prn pain Discussed case with Dr. Hyman Hopes.   2. Elevated blood pressure, situational; likely uncontrolled secondary to pain currently. - cloNIDine (CATAPRES) tablet 0.2 mg; Take 1 tablet (0.2 mg total) by mouth once. Reinforced taking his BP meds daily at about the same time.  BP started coming down in office  Patient have been counseled extensively about nutrition and exercise  Recheck in 2 weeks  The patient was given clear instructions to go to ER or return to medical center if symptoms don't improve, worsen or new problems develop. The patient verbalized understanding. The patient was told to call to get lab results if they haven't heard anything in the next week.     Georgian Co, PA-C [facility] [place], [place] [phone]   02/26/2016, 12:11 PM

## 2016-02-27 ENCOUNTER — Other Ambulatory Visit: Payer: Self-pay | Admitting: Physician Assistant

## 2016-03-04 ENCOUNTER — Other Ambulatory Visit: Payer: Self-pay | Admitting: *Deleted

## 2016-03-04 MED ORDER — METOPROLOL TARTRATE 25 MG PO TABS: 25.0000 mg | ORAL_TABLET | Freq: Two times a day (BID) | ORAL | Status: DC

## 2016-03-18 ENCOUNTER — Other Ambulatory Visit: Payer: Self-pay | Admitting: Physician Assistant

## 2016-03-19 ENCOUNTER — Other Ambulatory Visit: Payer: Self-pay | Admitting: *Deleted

## 2016-03-19 MED ORDER — LISINOPRIL 5 MG PO TABS: 5.0000 mg | ORAL_TABLET | Freq: Every day | ORAL | Status: DC

## 2016-03-19 MED ORDER — FOLIC ACID 1 MG PO TABS: 1.0000 mg | ORAL_TABLET | Freq: Every day | ORAL | Status: DC

## 2016-03-19 MED ORDER — METOPROLOL TARTRATE 25 MG PO TABS: 25.0000 mg | ORAL_TABLET | Freq: Two times a day (BID) | ORAL | Status: DC

## 2016-03-19 MED ORDER — FERROUS SULFATE 325 (65 FE) MG PO TABS: 325.0000 mg | ORAL_TABLET | Freq: Every day | ORAL | Status: DC

## 2016-03-22 ENCOUNTER — Ambulatory Visit: Payer: Commercial Managed Care - HMO | Admitting: Neurology

## 2016-03-23 ENCOUNTER — Encounter: Payer: Self-pay | Admitting: Neurology

## 2016-03-23 ENCOUNTER — Other Ambulatory Visit: Payer: Self-pay | Admitting: Pharmacist

## 2016-03-23 DIAGNOSIS — E119 Type 2 diabetes mellitus without complications: Secondary | ICD-10-CM

## 2016-03-23 MED ORDER — METFORMIN HCL 500 MG PO TABS: 500.0000 mg | ORAL_TABLET | Freq: Two times a day (BID) | ORAL | Status: DC

## 2016-04-01 ENCOUNTER — Encounter: Payer: Self-pay | Admitting: Internal Medicine

## 2016-04-01 ENCOUNTER — Ambulatory Visit: Payer: Commercial Managed Care - HMO | Attending: Internal Medicine | Admitting: Internal Medicine

## 2016-04-01 VITALS — BP 136/89 | HR 76 | Temp 98.1°F | Wt 219.0 lb

## 2016-04-01 DIAGNOSIS — I10 Essential (primary) hypertension: Secondary | ICD-10-CM | POA: Diagnosis not present

## 2016-04-01 DIAGNOSIS — M1000 Idiopathic gout, unspecified site: Secondary | ICD-10-CM | POA: Diagnosis not present

## 2016-04-01 DIAGNOSIS — E785 Hyperlipidemia, unspecified: Secondary | ICD-10-CM

## 2016-04-01 DIAGNOSIS — Z1211 Encounter for screening for malignant neoplasm of colon: Secondary | ICD-10-CM

## 2016-04-01 DIAGNOSIS — E119 Type 2 diabetes mellitus without complications: Secondary | ICD-10-CM | POA: Diagnosis not present

## 2016-04-01 LAB — GLUCOSE, POCT (MANUAL RESULT ENTRY): POC GLUCOSE: 104 mg/dL — AB (ref 70–99)

## 2016-04-01 LAB — POCT GLYCOSYLATED HEMOGLOBIN (HGB A1C): Hemoglobin A1C: 6.2

## 2016-04-01 MED ORDER — ACETAMINOPHEN-CODEINE #3 300-30 MG PO TABS: 1.0000 | ORAL_TABLET | Freq: Four times a day (QID) | ORAL | 0 refills | Status: DC | PRN

## 2016-04-01 MED ORDER — ATORVASTATIN CALCIUM 80 MG PO TABS: 80.0000 mg | ORAL_TABLET | Freq: Every day | ORAL | 3 refills | Status: DC

## 2016-04-01 NOTE — Progress Notes (Signed)
[name], is a 55 y.o. male  FQM:[id number]  WAQ:[id number]  DOB - [birth year]  Chief Complaint  Patient presents with  . Medication Refill        Subjective:   [name] is a 55 y.o. male with history of type 2 diabetes mellitus, hypertension, hyperlipidemia, gout and coronary artery disease status post CABG in March 2017 here today for a follow up visit and medication refill. Patient's major complaint today is ongoing pain in his knees due to gout, he is requesting refill of Tylenol No. 3. He follows up regularly with cardiologist, he is compliant with his medications, he reports no side effects. His blood sugar is well controlled as well as his blood pressure. Patient has No headache, No chest pain, No abdominal pain - No Nausea, No new weakness tingling or numbness, No Cough - SOB.  Problem  Type 2 Diabetes Mellitus Without Complication, Without Long-Term Current Use of Insulin (Hcc)  Primary Gout    ALLERGIES: Allergies  Allergen Reactions  . Other Anaphylaxis    mushrooms  . Hydrocodone-Acetaminophen Itching    PAST MEDICAL HISTORY: Past Medical History:  Diagnosis Date  . Baker's cyst of knee   . GERD (gastroesophageal reflux disease)   . Gouty arthritis    "real bad" (01/17/2013)  . Hypercholesteremia   . Hypertension   . Type II diabetes mellitus (HCC)     MEDICATIONS AT HOME: Prior to Admission medications   Medication Sig Start Date End Date Taking? Authorizing Provider  allopurinol (ZYLOPRIM) 300 MG tablet Take 1 tablet (300 mg total) by mouth daily. 02/26/16  Yes Marzella Schlein McClung, PA-C  atorvastatin (LIPITOR) 80 MG tablet Take 1 tablet (80 mg total) by mouth daily at 6 PM. 04/01/16  Yes Quentin Angst, MD  clopidogrel (PLAVIX) 75 MG tablet Take 1 tablet (75 mg total) by mouth daily. 12/12/15  Yes Selina Cooley, MD  ferrous sulfate 325 (65 FE) MG tablet Take 1 tablet (325 mg total) by mouth daily with breakfast. 03/19/16  Yes Janetta Hora, PA-C    folic acid (FOLVITE) 1 MG tablet Take 1 tablet (1 mg total) by mouth daily. 03/19/16  Yes Janetta Hora, PA-C  furosemide (LASIX) 40 MG tablet TAKE ONE TABLET BY MOUTH ONCE DAILY 02/26/16  Yes Janetta Hora, PA-C  KLOR-CON M20 20 MEQ tablet TAKE ONE TABLET BY MOUTH ONCE DAILY 02/26/16  Yes Janetta Hora, PA-C  lisinopril (PRINIVIL,ZESTRIL) 5 MG tablet Take 1 tablet (5 mg total) by mouth daily. 03/19/16  Yes Janetta Hora, PA-C  metFORMIN (GLUCOPHAGE) 500 MG tablet Take 1 tablet (500 mg total) by mouth 2 (two) times daily with a meal. 03/23/16  Yes Khoen Genet Annitta Needs, MD  metoprolol tartrate (LOPRESSOR) 25 MG tablet Take 1 tablet (25 mg total) by mouth 2 (two) times daily. 03/19/16  Yes Janetta Hora, PA-C  acetaminophen-codeine (TYLENOL #3) 300-30 MG tablet Take 1 tablet by mouth every 6 (six) hours as needed for moderate pain. 04/01/16   Quentin Angst, MD  colchicine 0.6 MG tablet Take 1 tablet (0.6 mg total) by mouth 2 (two) times daily. Prn gout flare 02/26/16   Anders Simmonds, PA-C     Objective:   Vitals:   04/01/16 0948  BP: 136/89  Pulse: 76  Temp: 98.1 F (36.7 C)  TempSrc: Oral  Weight: 219 lb (99.3 kg)    Exam General appearance : Awake, alert, not in any distress. Speech Clear. Not toxic looking,  obese HEENT: Atraumatic and Normocephalic, pupils equally reactive to light and accomodation Neck: Supple, no JVD. No cervical lymphadenopathy.  Chest: Good air entry bilaterally, no added sounds  CVS: S1 S2 regular, systolic murmurs.  Abdomen: Bowel sounds present, Non tender and not distended with no gaurding, rigidity or rebound. Extremities: B/L Lower Ext shows no edema, both legs are warm to touch Neurology: Awake alert, and oriented X 3, CN II-XII intact, Non focal Skin: No Rash  Data Review Lab Results  Component Value Date   HGBA1C 6.2 04/01/2016   HGBA1C 6.3 (H) 11/15/2015   HGBA1C 6.2% 02/24/2015     Assessment & Plan   1. Type 2  diabetes mellitus without complication, without long-term current use of insulin (HCC)  - POCT glucose (manual entry) - POCT glycosylated hemoglobin (Hb A1C) is 6.2% today - Continue Metformin  Aim for 30 minutes of exercise most days. Rethink what you drink. Water is great! Aim for 2-3 Carb Choices per meal (30-45 grams) +/- 1 either way  Aim for 0-15 Carbs per snack if hungry  Include protein in moderation with your meals and snacks  Consider reading food labels for Total Carbohydrate and Fat Grams of foods  Consider checking BG at alternate times per day  Continue taking medication as directed Be mindful about how much sugar you are adding to beverages and other foods. Fruit Punch - find one with no sugar  Measure and decrease portions of carbohydrate foods  Make your plate and don't go back for seconds  2. Essential hypertension  We have discussed target BP range and blood pressure goal. I have advised patient to check BP regularly and to call us back or report to clinic if the numbers are consistently higher than 140/90. We discussed the importance of compliance with medical therapy and DASH diet recommended, consequences of uncontrolled hypertension discussed.  - continue current BP medications  3. Dyslipidemia  - atorvastatin (LIPITOR) 80 MG tablet; Take 1 tablet (80 mg total) by mouth daily at 6 PM.  Dispense: 90 tablet; Refill: 3  To address this please limit saturated fat to no more than 7% of your calories, limit cholesterol to 200 mg/day, increase fiber and exercise as tolerated. If needed we may add another cholesterol lowering medication to your regimen.   4. Idiopathic gout, unspecified chronicity, unspecified site  - acetaminophen-codeine (TYLENOL #3) 300-30 MG tablet; Take 1 tablet by mouth every 6 (six) hours as needed for moderate pain.  Dispense: 60 tablet; Refill: 0  5. Colon cancer screening  - Ambulatory referral to Gastroenterology  Patient have been  counseled extensively about nutrition and exercise  Return in about 3 months (around 07/02/2016) for Hemoglobin A1C and Follow up, DM, Follow up Pain and comorbidities, Follow up HTN.  The patient was given clear instructions to go to ER or return to medical center if symptoms don't improve, worsen or new problems develop. The patient verbalized understanding. The patient was told to call to get lab results if they haven't heard anything in the next week.   This note has been created with Education officer, environmental. Any transcriptional errors are unintentional.    Jeanann Lewandowsky, MD, MHA, Maxwell Caul, CPE [facility] [place], [place] [phone]   04/01/2016, 10:43 AM

## 2016-04-01 NOTE — Patient Instructions (Signed)
Blood Glucose Monitoring, Adult Monitoring your blood glucose (also know as blood sugar) helps you to manage your diabetes. It also helps you and your health care provider monitor your diabetes and determine how well your treatment plan is working. WHY SHOULD YOU MONITOR YOUR BLOOD GLUCOSE?  It can help you understand how food, exercise, and medicine affect your blood glucose.  It allows you to know what your blood glucose is at any given moment. You can quickly tell if you are having low blood glucose (hypoglycemia) or high blood glucose (hyperglycemia).  It can help you and your health care provider know how to adjust your medicines.  It can help you understand how to manage an illness or adjust medicine for exercise. WHEN SHOULD YOU TEST? Your health care provider will help you decide how often you should check your blood glucose. This may depend on the type of diabetes you have, your diabetes control, or the types of medicines you are taking. Be sure to write down all of your blood glucose readings so that this information can be reviewed with your health care provider. See below for examples of testing times that your health care provider may suggest. Type 1 Diabetes  Test at least 2 times per day if your diabetes is well controlled, if you are using an insulin pump, or if you perform multiple daily injections.  If your diabetes is not well controlled or if you are sick, you may need to test more often.  It is a good idea to also test:  Before every insulin injection.  Before and after exercise.  Between meals and 2 hours after a meal.  Occasionally between 2:00 a.m. and 3:00 a.m. Type 2 Diabetes  If you are taking insulin, test at least 2 times per day. However, it is best to test before every insulin injection.  If you take medicines by mouth (orally), test 2 times a day.  If you are on a controlled diet, test once a day.  If your diabetes is not well controlled or if you  are sick, you may need to monitor more often. HOW TO MONITOR YOUR BLOOD GLUCOSE Supplies Needed  Blood glucose meter.  Test strips for your meter. Each meter has its own strips. You must use the strips that go with your own meter.  A pricking needle (lancet).  A device that holds the lancet (lancing device).  A journal or log book to write down your results. Procedure  Wash your hands with soap and water. Alcohol is not preferred.  Prick the side of your finger (not the tip) with the lancet.  Gently milk the finger until a small drop of blood appears.  Follow the instructions that come with your meter for inserting the test strip, applying blood to the strip, and using your blood glucose meter. Other Areas to Get Blood for Testing Some meters allow you to use other areas of your body (other than your finger) to test your blood. These areas are called alternative sites. The most common alternative sites are:  The forearm.  The thigh.  The back area of the lower leg.  The palm of the hand. The blood flow in these areas is slower. Therefore, the blood glucose values you get may be delayed, and the numbers are different from what you would get from your fingers. Do not use alternative sites if you think you are having hypoglycemia. Your reading will not be accurate. Always use a finger if you are  having hypoglycemia. Also, if you cannot feel your lows (hypoglycemia unawareness), always use your fingers for your blood glucose checks. ADDITIONAL TIPS FOR GLUCOSE MONITORING  Do not reuse lancets.  Always carry your supplies with you.  All blood glucose meters have a 24-hour "hotline" number to call if you have questions or need help.  Adjust (calibrate) your blood glucose meter with a control solution after finishing a few boxes of strips. BLOOD GLUCOSE RECORD KEEPING It is a good idea to keep a daily record or log of your blood glucose readings. Most glucose meters, if not all,  keep your glucose records stored in the meter. Some meters come with the ability to download your records to your home computer. Keeping a record of your blood glucose readings is especially helpful if you are wanting to look for patterns. Make notes to go along with the blood glucose readings because you might forget what happened at that exact time. Keeping good records helps you and your health care provider to work together to achieve good diabetes management.    This information is not intended to replace advice given to you by your health care provider. Make sure you discuss any questions you have with your health care provider.   Document Released: 08/26/2003 Document Revised: 09/13/2014 Document Reviewed: 01/15/2013 Elsevier Interactive Patient Education 2016 [place] Carbohydrate Counting for Diabetes Mellitus Carbohydrate counting is a method for keeping track of the amount of carbohydrates you eat. Eating carbohydrates naturally increases the level of sugar (glucose) in your blood, so it is important for you to know the amount that is okay for you to have in every meal. Carbohydrate counting helps keep the level of glucose in your blood within normal limits. The amount of carbohydrates allowed is different for every person. A dietitian can help you calculate the amount that is right for you. Once you know the amount of carbohydrates you can have, you can count the carbohydrates in the foods you want to eat. Carbohydrates are found in the following foods:  Grains, such as breads and cereals.  Dried beans and soy products.  Starchy vegetables, such as potatoes, peas, and corn.  Fruit and fruit juices.  Milk and yogurt.  Sweets and snack foods, such as cake, cookies, candy, chips, soft drinks, and fruit drinks. CARBOHYDRATE COUNTING There are two ways to count the carbohydrates in your food. You can use either of the methods or a combination of both. Reading the "Nutrition  Facts" on [place] The "Nutrition Facts" is an area that is included on the labels of almost all packaged food and beverages in the [place]. It includes the serving size of that food or beverage and information about the nutrients in each serving of the food, including the grams (g) of carbohydrate per serving.  Decide the number of servings of this food or beverage that you will be able to eat or drink. Multiply that number of servings by the number of grams of carbohydrate that is listed on the label for that serving. The total will be the amount of carbohydrates you will be having when you eat or drink this food or beverage. Learning Standard Serving Sizes of Food When you eat food that is not packaged or does not include "Nutrition Facts" on the label, you need to measure the servings in order to count the amount of carbohydrates.A serving of most carbohydrate-rich foods contains about 15 g of carbohydrates. The following list includes serving sizes of carbohydrate-rich  foods that provide 15 g ofcarbohydrate per serving:   1 slice of bread (1 oz) or 1 six-inch tortilla.    of a hamburger bun or English muffin.  4-6 crackers.   cup unsweetened dry cereal.    cup hot cereal.   cup rice or pasta.    cup mashed potatoes or  of a large baked potato.  1 cup fresh fruit or one small piece of fruit.    cup canned or frozen fruit or fruit juice.  1 cup milk.   cup plain fat-free yogurt or yogurt sweetened with artificial sweeteners.   cup cooked dried beans or starchy vegetable, such as peas, corn, or potatoes.  Decide the number of standard-size servings that you will eat. Multiply that number of servings by 15 (the grams of carbohydrates in that serving). For example, if you eat 2 cups of strawberries, you will have eaten 2 servings and 30 g of carbohydrates (2 servings x 15 g = 30 g). For foods such as soups and casseroles, in which more than one food is mixed in,  you will need to count the carbohydrates in each food that is included. EXAMPLE OF CARBOHYDRATE COUNTING Sample Dinner  3 oz chicken breast.   cup of brown rice.   cup of corn.  1 cup milk.   1 cup strawberries with sugar-free whipped topping.  Carbohydrate Calculation Step 1: Identify the foods that contain carbohydrates:   Rice.   Corn.   Milk.   Strawberries. Step 2:Calculate the number of servings eaten of each:   2 servings of rice.   1 serving of corn.   1 serving of milk.   1 serving of strawberries. Step 3: Multiply each of those number of servings by 15 g:   2 servings of rice x 15 g = 30 g.   1 serving of corn x 15 g = 15 g.   1 serving of milk x 15 g = 15 g.   1 serving of strawberries x 15 g = 15 g. Step 4: Add together all of the amounts to find the total grams of carbohydrates eaten: 30 g + 15 g + 15 g + 15 g = 75 g.   This information is not intended to replace advice given to you by your health care provider. Make sure you discuss any questions you have with your health care provider.   Document Released: 08/23/2005 Document Revised: 09/13/2014 Document Reviewed: 07/20/2013 Elsevier Interactive Patient Education 2016 [employer]. Hypertension Hypertension, commonly called high blood pressure, is when the force of blood pumping through your arteries is too strong. Your arteries are the blood vessels that carry blood from your heart throughout your body. A blood pressure reading consists of a higher number over a lower number, such as 110/72. The higher number (systolic) is the pressure inside your arteries when your heart pumps. The lower number (diastolic) is the pressure inside your arteries when your heart relaxes. Ideally you want your blood pressure below 120/80. Hypertension forces your heart to work harder to pump blood. Your arteries may become narrow or stiff. Having untreated or uncontrolled hypertension can cause heart  attack, stroke, kidney disease, and other problems. RISK FACTORS Some risk factors for high blood pressure are controllable. Others are not.  Risk factors you cannot control include:   Race. You may be at higher risk if you are African American.  Age. Risk increases with age.  Gender. Men are at higher risk than women  before age 22 years. After age 69, women are at higher risk than men. Risk factors you can control include:  Not getting enough exercise or physical activity.  Being overweight.  Getting too much fat, sugar, calories, or salt in your diet.  Drinking too much alcohol. SIGNS AND SYMPTOMS Hypertension does not usually cause signs or symptoms. Extremely high blood pressure (hypertensive crisis) may cause headache, anxiety, shortness of breath, and nosebleed. DIAGNOSIS To check if you have hypertension, your health care provider will measure your blood pressure while you are seated, with your arm held at the level of your heart. It should be measured at least twice using the same arm. Certain conditions can cause a difference in blood pressure between your right and left arms. A blood pressure reading that is higher than normal on one occasion does not mean that you need treatment. If it is not clear whether you have high blood pressure, you may be asked to return on a different day to have your blood pressure checked again. Or, you may be asked to monitor your blood pressure at home for 1 or more weeks. TREATMENT Treating high blood pressure includes making lifestyle changes and possibly taking medicine. Living a healthy lifestyle can help lower high blood pressure. You may need to change some of your habits. Lifestyle changes may include:  Following the DASH diet. This diet is high in fruits, vegetables, and whole grains. It is low in salt, red meat, and added sugars.  Keep your sodium intake below 2,300 mg per day.  Getting at least 30-45 minutes of aerobic exercise at  least 4 times per week.  Losing weight if necessary.  Not smoking.  Limiting alcoholic beverages.  Learning ways to reduce stress. Your health care provider may prescribe medicine if lifestyle changes are not enough to get your blood pressure under control, and if one of the following is true:  You are 46-15 years of age and your systolic blood pressure is above 140.  You are 7 years of age or older, and your systolic blood pressure is above 150.  Your diastolic blood pressure is above 90.  You have diabetes, and your systolic blood pressure is over 140 or your diastolic blood pressure is over 90.  You have kidney disease and your blood pressure is above 140/90.  You have heart disease and your blood pressure is above 140/90. Your personal target blood pressure may vary depending on your medical conditions, your age, and other factors. HOME CARE INSTRUCTIONS  Have your blood pressure rechecked as directed by your health care provider.   Take medicines only as directed by your health care provider. Follow the directions carefully. Blood pressure medicines must be taken as prescribed. The medicine does not work as well when you skip doses. Skipping doses also puts you at risk for problems.  Do not smoke.   Monitor your blood pressure at home as directed by your health care provider. SEEK MEDICAL CARE IF:   You think you are having a reaction to medicines taken.  You have recurrent headaches or feel dizzy.  You have swelling in your ankles.  You have trouble with your vision. SEEK IMMEDIATE MEDICAL CARE IF:  You develop a severe headache or confusion.  You have unusual weakness, numbness, or feel faint.  You have severe chest or abdominal pain.  You vomit repeatedly.  You have trouble breathing. MAKE SURE YOU:   Understand these instructions.  Will watch your condition.  Will get  help right away if you are not doing well or get worse.   This information is  not intended to replace advice given to you by your health care provider. Make sure you discuss any questions you have with your health care provider.   Document Released: 08/23/2005 Document Revised: 01/07/2015 Document Reviewed: 06/15/2013 Elsevier Interactive Patient Education 2016 [employer] Inc[employer] Gout Gout is an inflammatory arthritis caused by a buildup of uric acid crystals in the joints. Uric acid is a chemical that is normally present in the blood. When the level of uric acid in the blood is too high it can form crystals that deposit in your joints and tissues. This causes joint redness, soreness, and swelling (inflammation). Repeat attacks are common. Over time, uric acid crystals can form into masses (tophi) near a joint, destroying bone and causing disfigurement. Gout is treatable and often preventable. CAUSES  The disease begins with elevated levels of uric acid in the blood. Uric acid is produced by your body when it breaks down a naturally found substance called purines. Certain foods you eat, such as meats and fish, contain high amounts of purines. Causes of an elevated uric acid level include:  Being passed down from parent to child (heredity).  Diseases that cause increased uric acid production (such as obesity, psoriasis, and certain cancers).  Excessive alcohol use.  Diet, especially diets rich in meat and seafood.  Medicines, including certain cancer-fighting medicines (chemotherapy), water pills (diuretics), and aspirin.  Chronic kidney disease. The kidneys are no longer able to remove uric acid well.  Problems with metabolism. Conditions strongly associated with gout include:  Obesity.  High blood pressure.  High cholesterol.  Diabetes. Not everyone with elevated uric acid levels gets gout. It is not understood why some people get gout and others do not. Surgery, joint injury, and eating too much of certain foods are some of the factors that can lead to gout  attacks. SYMPTOMS   An attack of gout comes on quickly. It causes intense pain with redness, swelling, and warmth in a joint.  Fever can occur.  Often, only one joint is involved. Certain joints are more commonly involved:  Base of the big toe.  Knee.  Ankle.  Wrist.  Finger. Without treatment, an attack usually goes away in a few days to weeks. Between attacks, you usually will not have symptoms, which is different from many other forms of arthritis. DIAGNOSIS  Your caregiver will suspect gout based on your symptoms and exam. In some cases, tests may be recommended. The tests may include:  Blood tests.  Urine tests.  X-rays.  Joint fluid exam. This exam requires a needle to remove fluid from the joint (arthrocentesis). Using a microscope, gout is confirmed when uric acid crystals are seen in the joint fluid. TREATMENT  There are two phases to gout treatment: treating the sudden onset (acute) attack and preventing attacks (prophylaxis).  Treatment of an Acute Attack.  Medicines are used. These include anti-inflammatory medicines or steroid medicines.  An injection of steroid medicine into the affected joint is sometimes necessary.  The painful joint is rested. Movement can worsen the arthritis.  You may use warm or cold treatments on painful joints, depending which works best for you.  Treatment to Prevent Attacks.  If you suffer from frequent gout attacks, your caregiver may advise preventive medicine. These medicines are started after the acute attack subsides. These medicines either help your kidneys eliminate uric acid from your body or decrease your uric  acid production. You may need to stay on these medicines for a very long time.  The early phase of treatment with preventive medicine can be associated with an increase in acute gout attacks. For this reason, during the first few months of treatment, your caregiver may also advise you to take medicines usually used  for acute gout treatment. Be sure you understand your caregiver's directions. Your caregiver may make several adjustments to your medicine dose before these medicines are effective.  Discuss dietary treatment with your caregiver or dietitian. Alcohol and drinks high in sugar and fructose and foods such as meat, poultry, and seafood can increase uric acid levels. Your caregiver or dietitian can advise you on drinks and foods that should be limited. HOME CARE INSTRUCTIONS   Do not take aspirin to relieve pain. This raises uric acid levels.  Only take over-the-counter or prescription medicines for pain, discomfort, or fever as directed by your caregiver.  Rest the joint as much as possible. When in bed, keep sheets and blankets off painful areas.  Keep the affected joint raised (elevated).  Apply warm or cold treatments to painful joints. Use of warm or cold treatments depends on which works best for you.  Use crutches if the painful joint is in your leg.  Drink enough fluids to keep your urine clear or pale yellow. This helps your body get rid of uric acid. Limit alcohol, sugary drinks, and fructose drinks.  Follow your dietary instructions. Pay careful attention to the amount of protein you eat. Your daily diet should emphasize fruits, vegetables, whole grains, and fat-free or low-fat milk products. Discuss the use of coffee, vitamin C, and cherries with your caregiver or dietitian. These may be helpful in lowering uric acid levels.  Maintain a healthy body weight. SEEK MEDICAL CARE IF:   You develop diarrhea, vomiting, or any side effects from medicines.  You do not feel better in 24 hours, or you are getting worse. SEEK IMMEDIATE MEDICAL CARE IF:   Your joint becomes suddenly more tender, and you have chills or a fever. MAKE SURE YOU:   Understand these instructions.  Will watch your condition.  Will get help right away if you are not doing well or get worse.   This information  is not intended to replace advice given to you by your health care provider. Make sure you discuss any questions you have with your health care provider.   Document Released: 08/20/2000 Document Revised: 09/13/2014 Document Reviewed: 04/05/2012 Elsevier Interactive Patient Education [employer].

## 2016-05-19 ENCOUNTER — Telehealth: Payer: Self-pay | Admitting: Internal Medicine

## 2016-05-19 NOTE — Telephone Encounter (Signed)
Patient is requesting prescription refill for Tylenol #3...Marland Kitchenplease follow up

## 2016-05-21 ENCOUNTER — Other Ambulatory Visit: Payer: Self-pay | Admitting: Internal Medicine

## 2016-05-21 DIAGNOSIS — M1000 Idiopathic gout, unspecified site: Secondary | ICD-10-CM

## 2016-05-21 MED ORDER — ACETAMINOPHEN-CODEINE #3 300-30 MG PO TABS: 1.0000 | ORAL_TABLET | Freq: Four times a day (QID) | ORAL | 0 refills | Status: DC | PRN

## 2016-06-07 ENCOUNTER — Other Ambulatory Visit: Payer: Self-pay | Admitting: Physician Assistant

## 2016-06-07 DIAGNOSIS — M1000 Idiopathic gout, unspecified site: Secondary | ICD-10-CM

## 2016-06-08 NOTE — Telephone Encounter (Signed)
Okay to refill or should this be deferred to patients pcp? Please advise. Thanks, MI 

## 2016-06-09 NOTE — Telephone Encounter (Signed)
Please defer to PCP. Thank you!

## 2016-06-10 ENCOUNTER — Other Ambulatory Visit: Payer: Self-pay | Admitting: Pharmacist

## 2016-06-10 MED ORDER — FERROUS SULFATE 325 (65 FE) MG PO TABS: 325.0000 mg | ORAL_TABLET | Freq: Every day | ORAL | 2 refills | Status: DC

## 2016-06-24 ENCOUNTER — Ambulatory Visit: Payer: Commercial Managed Care - HMO | Attending: Internal Medicine | Admitting: Internal Medicine

## 2016-06-24 VITALS — BP 114/80 | HR 68 | Temp 98.4°F | Resp 18 | Ht 66.0 in | Wt 222.0 lb

## 2016-06-24 DIAGNOSIS — E119 Type 2 diabetes mellitus without complications: Secondary | ICD-10-CM | POA: Insufficient documentation

## 2016-06-24 DIAGNOSIS — I2510 Atherosclerotic heart disease of native coronary artery without angina pectoris: Secondary | ICD-10-CM | POA: Diagnosis not present

## 2016-06-24 DIAGNOSIS — K219 Gastro-esophageal reflux disease without esophagitis: Secondary | ICD-10-CM | POA: Diagnosis not present

## 2016-06-24 DIAGNOSIS — Z888 Allergy status to other drugs, medicaments and biological substances status: Secondary | ICD-10-CM | POA: Insufficient documentation

## 2016-06-24 DIAGNOSIS — Z79899 Other long term (current) drug therapy: Secondary | ICD-10-CM | POA: Diagnosis not present

## 2016-06-24 DIAGNOSIS — Z951 Presence of aortocoronary bypass graft: Secondary | ICD-10-CM | POA: Insufficient documentation

## 2016-06-24 DIAGNOSIS — Z7984 Long term (current) use of oral hypoglycemic drugs: Secondary | ICD-10-CM | POA: Insufficient documentation

## 2016-06-24 DIAGNOSIS — E7800 Pure hypercholesterolemia, unspecified: Secondary | ICD-10-CM | POA: Insufficient documentation

## 2016-06-24 DIAGNOSIS — M868X8 Other osteomyelitis, other site: Secondary | ICD-10-CM | POA: Insufficient documentation

## 2016-06-24 DIAGNOSIS — I10 Essential (primary) hypertension: Secondary | ICD-10-CM | POA: Insufficient documentation

## 2016-06-24 DIAGNOSIS — M1000 Idiopathic gout, unspecified site: Secondary | ICD-10-CM | POA: Insufficient documentation

## 2016-06-24 LAB — POCT GLYCOSYLATED HEMOGLOBIN (HGB A1C): HEMOGLOBIN A1C: 5.8

## 2016-06-24 MED ORDER — SULFAMETHOXAZOLE-TRIMETHOPRIM 800-160 MG PO TABS: 1.0000 | ORAL_TABLET | Freq: Two times a day (BID) | ORAL | 0 refills | Status: AC

## 2016-06-24 MED ORDER — ACETAMINOPHEN-CODEINE #3 300-30 MG PO TABS: 1.0000 | ORAL_TABLET | Freq: Four times a day (QID) | ORAL | 0 refills | Status: DC | PRN
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
## 2016-06-24 NOTE — Patient Instructions (Signed)
Diabetes and Foot Care Diabetes may cause you to have problems because of poor blood supply (circulation) to your feet and legs. This may cause the skin on your feet to become thinner, break easier, and heal more slowly. Your skin may become dry, and the skin may peel and crack. You may also have nerve damage in your legs and feet causing decreased feeling in them. You may not notice minor injuries to your feet that could lead to infections or more serious problems. Taking care of your feet is one of the most important things you can do for yourself.  HOME CARE INSTRUCTIONS  Wear shoes at all times, even in the house. Do not go barefoot. Bare feet are easily injured.  Check your feet daily for blisters, cuts, and redness. If you cannot see the bottom of your feet, use a mirror or ask someone for help.  Wash your feet with warm water (do not use hot water) and mild soap. Then pat your feet and the areas between your toes until they are completely dry. Do not soak your feet as this can dry your skin.  Apply a moisturizing lotion or petroleum jelly (that does not contain alcohol and is unscented) to the skin on your feet and to dry, brittle toenails. Do not apply lotion between your toes.  Trim your toenails straight across. Do not dig under them or around the cuticle. File the edges of your nails with an emery board or nail file.  Do not cut corns or calluses or try to remove them with medicine.  Wear clean socks or stockings every day. Make sure they are not too tight. Do not wear knee-high stockings since they may decrease blood flow to your legs.  Wear shoes that fit properly and have enough cushioning. To break in new shoes, wear them for just a few hours a day. This prevents you from injuring your feet. Always look in your shoes before you put them on to be sure there are no objects inside.  Do not cross your legs. This may decrease the blood flow to your feet.  If you find a minor scrape,  cut, or break in the skin on your feet, keep it and the skin around it clean and dry. These areas may be cleansed with mild soap and water. Do not cleanse the area with peroxide, alcohol, or iodine.  When you remove an adhesive bandage, be sure not to damage the skin around it.  If you have a wound, look at it several times a day to make sure it is healing.  Do not use heating pads or hot water bottles. They may burn your skin. If you have lost feeling in your feet or legs, you may not know it is happening until it is too late.  Make sure your health care provider performs a complete foot exam at least annually or more often if you have foot problems. Report any cuts, sores, or bruises to your health care provider immediately. SEEK MEDICAL CARE IF:   You have an injury that is not healing.  You have cuts or breaks in the skin.  You have an ingrown nail.  You notice redness on your legs or feet.  You feel burning or tingling in your legs or feet.  You have pain or cramps in your legs and feet.  Your legs or feet are numb.  Your feet always feel cold. SEEK IMMEDIATE MEDICAL CARE IF:   There is increasing redness,   swelling, or pain in or around a wound.  There is a red line that goes up your leg.  Pus is coming from a wound.  You develop a fever or as directed by your health care provider.  You notice a bad smell coming from an ulcer or wound.   This information is not intended to replace advice given to you by your health care provider. Make sure you discuss any questions you have with your health care provider.   Document Released: 08/20/2000 Document Revised: 04/25/2013 Document Reviewed: 01/30/2013 Elsevier Interactive Patient Education 2016 [employer]. Hypertension Hypertension, commonly called high blood pressure, is when the force of blood pumping through your arteries is too strong. Your arteries are the blood vessels that carry blood from your heart throughout your  body. A blood pressure reading consists of a higher number over a lower number, such as 110/72. The higher number (systolic) is the pressure inside your arteries when your heart pumps. The lower number (diastolic) is the pressure inside your arteries when your heart relaxes. Ideally you want your blood pressure below 120/80. Hypertension forces your heart to work harder to pump blood. Your arteries may become narrow or stiff. Having untreated or uncontrolled hypertension can cause heart attack, stroke, kidney disease, and other problems. RISK FACTORS Some risk factors for high blood pressure are controllable. Others are not.  Risk factors you cannot control include:   Race. You may be at higher risk if you are African American.  Age. Risk increases with age.  Gender. Men are at higher risk than women before age 53 years. After age 78, women are at higher risk than men. Risk factors you can control include:  Not getting enough exercise or physical activity.  Being overweight.  Getting too much fat, sugar, calories, or salt in your diet.  Drinking too much alcohol. SIGNS AND SYMPTOMS Hypertension does not usually cause signs or symptoms. Extremely high blood pressure (hypertensive crisis) may cause headache, anxiety, shortness of breath, and nosebleed. DIAGNOSIS To check if you have hypertension, your health care provider will measure your blood pressure while you are seated, with your arm held at the level of your heart. It should be measured at least twice using the same arm. Certain conditions can cause a difference in blood pressure between your right and left arms. A blood pressure reading that is higher than normal on one occasion does not mean that you need treatment. If it is not clear whether you have high blood pressure, you may be asked to return on a different day to have your blood pressure checked again. Or, you may be asked to monitor your blood pressure at home for 1 or more  weeks. TREATMENT Treating high blood pressure includes making lifestyle changes and possibly taking medicine. Living a healthy lifestyle can help lower high blood pressure. You may need to change some of your habits. Lifestyle changes may include:  Following the DASH diet. This diet is high in fruits, vegetables, and whole grains. It is low in salt, red meat, and added sugars.  Keep your sodium intake below 2,300 mg per day.  Getting at least 30-45 minutes of aerobic exercise at least 4 times per week.  Losing weight if necessary.  Not smoking.  Limiting alcoholic beverages.  Learning ways to reduce stress. Your health care provider may prescribe medicine if lifestyle changes are not enough to get your blood pressure under control, and if one of the following is true:  You are  71-80 years of age and your systolic blood pressure is above 140.  You are 76 years of age or older, and your systolic blood pressure is above 150.  Your diastolic blood pressure is above 90.  You have diabetes, and your systolic blood pressure is over 140 or your diastolic blood pressure is over 90.  You have kidney disease and your blood pressure is above 140/90.  You have heart disease and your blood pressure is above 140/90. Your personal target blood pressure may vary depending on your medical conditions, your age, and other factors. HOME CARE INSTRUCTIONS  Have your blood pressure rechecked as directed by your health care provider.   Take medicines only as directed by your health care provider. Follow the directions carefully. Blood pressure medicines must be taken as prescribed. The medicine does not work as well when you skip doses. Skipping doses also puts you at risk for problems.  Do not smoke.   Monitor your blood pressure at home as directed by your health care provider. SEEK MEDICAL CARE IF:   You think you are having a reaction to medicines taken.  You have recurrent headaches or  feel dizzy.  You have swelling in your ankles.  You have trouble with your vision. SEEK IMMEDIATE MEDICAL CARE IF:  You develop a severe headache or confusion.  You have unusual weakness, numbness, or feel faint.  You have severe chest or abdominal pain.  You vomit repeatedly.  You have trouble breathing. MAKE SURE YOU:   Understand these instructions.  Will watch your condition.  Will get help right away if you are not doing well or get worse.   This information is not intended to replace advice given to you by your health care provider. Make sure you discuss any questions you have with your health care provider.   Document Released: 08/23/2005 Document Revised: 01/07/2015 Document Reviewed: 06/15/2013 Elsevier Interactive Patient Education 2016 Elsevier Inc. Abscess An abscess is an infected area that contains a collection of pus and debris.It can occur in almost any part of the body. An abscess is also known as a furuncle or boil. CAUSES  An abscess occurs when tissue gets infected. This can occur from blockage of oil or sweat glands, infection of hair follicles, or a minor injury to the skin. As the body tries to fight the infection, pus collects in the area and creates pressure under the skin. This pressure causes pain. People with weakened immune systems have difficulty fighting infections and get certain abscesses more often.  SYMPTOMS Usually an abscess develops on the skin and becomes a painful mass that is red, warm, and tender. If the abscess forms under the skin, you may feel a moveable soft area under the skin. Some abscesses break open (rupture) on their own, but most will continue to get worse without care. The infection can spread deeper into the body and eventually into the bloodstream, causing you to feel ill.  DIAGNOSIS  Your caregiver will take your medical history and perform a physical exam. A sample of fluid may also be taken from the abscess to determine  what is causing your infection. TREATMENT  Your caregiver may prescribe antibiotic medicines to fight the infection. However, taking antibiotics alone usually does not cure an abscess. Your caregiver may need to make a small cut (incision) in the abscess to drain the pus. In some cases, gauze is packed into the abscess to reduce pain and to continue draining the area. HOME CARE INSTRUCTIONS  Only take over-the-counter or prescription medicines for pain, discomfort, or fever as directed by your caregiver.  If you were prescribed antibiotics, take them as directed. Finish them even if you start to feel better.  If gauze is used, follow your caregiver's directions for changing the gauze.  To avoid spreading the infection:  Keep your draining abscess covered with a bandage.  Wash your hands well.  Do not share personal care items, towels, or whirlpools with others.  Avoid skin contact with others.  Keep your skin and clothes clean around the abscess.  Keep all follow-up appointments as directed by your caregiver. SEEK MEDICAL CARE IF:   You have increased pain, swelling, redness, fluid drainage, or bleeding.  You have muscle aches, chills, or a general ill feeling.  You have a fever. MAKE SURE YOU:   Understand these instructions.  Will watch your condition.  Will get help right away if you are not doing well or get worse.   This information is not intended to replace advice given to you by your health care provider. Make sure you discuss any questions you have with your health care provider.   Document Released: 06/02/2005 Document Revised: 02/22/2012 Document Reviewed: 11/05/2011 Elsevier Interactive Patient Education [employer] Elsevier Inc.

## 2016-06-24 NOTE — Progress Notes (Signed)
Patient is here for Abcess on back  Patient denies pain at this time  Patient declined flu vaccine today.  Patient has taken medication today and patient has eaten today.

## 2016-06-25 LAB — MICROALBUMIN / CREATININE URINE RATIO
Creatinine, Urine: 58 mg/dL (ref 20–370)
Microalb Creat Ratio: 453 mcg/mg creat — ABNORMAL HIGH (ref <30)
Microalb, Ur: 26.3 mg/dL

## 2016-06-28 ENCOUNTER — Telehealth: Payer: Self-pay | Admitting: Internal Medicine

## 2016-06-28 NOTE — Telephone Encounter (Signed)
I already did the PA with [facility] waiting for approval still pending  Thank you

## 2016-06-28 NOTE — Telephone Encounter (Signed)
Patient called the office to speak with nurse regarding the referral that is being requested by [name] for an MRI that patient needs. Please place referral. Patient has an appointment [name]) tomorrow @ 10am.  Thank you.

## 2016-06-29 ENCOUNTER — Encounter (HOSPITAL_COMMUNITY): Payer: Self-pay

## 2016-06-29 ENCOUNTER — Ambulatory Visit (HOSPITAL_COMMUNITY)
Admission: RE | Admit: 2016-06-29 | Discharge: 2016-06-29 | Disposition: A | Discharge status: Home / Self Care | Payer: Commercial Managed Care - HMO | Source: Ambulatory Visit | Attending: Internal Medicine | Admitting: Internal Medicine

## 2016-06-29 DIAGNOSIS — M868X8 Other osteomyelitis, other site: Secondary | ICD-10-CM

## 2016-07-06 ENCOUNTER — Other Ambulatory Visit: Payer: Self-pay | Admitting: Internal Medicine

## 2016-07-06 DIAGNOSIS — E119 Type 2 diabetes mellitus without complications: Secondary | ICD-10-CM

## 2016-07-07 ENCOUNTER — Ambulatory Visit: Payer: Commercial Managed Care - HMO | Admitting: Podiatry

## 2016-07-08 ENCOUNTER — Ambulatory Visit (HOSPITAL_COMMUNITY)
Admission: RE | Admit: 2016-07-08 | Discharge: 2016-07-08 | Disposition: A | Discharge status: Home / Self Care | Payer: Commercial Managed Care - HMO | Source: Ambulatory Visit | Attending: Internal Medicine | Admitting: Internal Medicine

## 2016-07-08 ENCOUNTER — Other Ambulatory Visit: Payer: Self-pay | Admitting: Internal Medicine

## 2016-07-08 ENCOUNTER — Encounter (HOSPITAL_COMMUNITY): Payer: Self-pay

## 2016-07-08 DIAGNOSIS — M868X8 Other osteomyelitis, other site: Secondary | ICD-10-CM

## 2016-07-09 ENCOUNTER — Encounter: Payer: Self-pay | Admitting: Podiatry

## 2016-07-09 ENCOUNTER — Other Ambulatory Visit: Payer: Self-pay | Admitting: Internal Medicine

## 2016-07-09 ENCOUNTER — Ambulatory Visit (INDEPENDENT_AMBULATORY_CARE_PROVIDER_SITE_OTHER): Payer: Commercial Managed Care - HMO | Admitting: Podiatry

## 2016-07-09 DIAGNOSIS — B351 Tinea unguium: Secondary | ICD-10-CM | POA: Diagnosis not present

## 2016-07-09 DIAGNOSIS — M79674 Pain in right toe(s): Secondary | ICD-10-CM

## 2016-07-09 DIAGNOSIS — L988 Other specified disorders of the skin and subcutaneous tissue: Secondary | ICD-10-CM | POA: Diagnosis not present

## 2016-07-09 DIAGNOSIS — M79675 Pain in left toe(s): Secondary | ICD-10-CM | POA: Diagnosis not present

## 2016-07-09 DIAGNOSIS — M868X8 Other osteomyelitis, other site: Secondary | ICD-10-CM

## 2016-07-09 NOTE — Patient Instructions (Signed)
Diabetes and Foot Care Diabetes may cause you to have problems because of poor blood supply (circulation) to your feet and legs. This may cause the skin on your feet to become thinner, break easier, and heal more slowly. Your skin may become dry, and the skin may peel and crack. You may also have nerve damage in your legs and feet causing decreased feeling in them. You may not notice minor injuries to your feet that could lead to infections or more serious problems. Taking care of your feet is one of the most important things you can do for yourself.  HOME CARE INSTRUCTIONS  Wear shoes at all times, even in the house. Do not go barefoot. Bare feet are easily injured.  Check your feet daily for blisters, cuts, and redness. If you cannot see the bottom of your feet, use a mirror or ask someone for help.  Wash your feet with warm water (do not use hot water) and mild soap. Then pat your feet and the areas between your toes until they are completely dry. Do not soak your feet as this can dry your skin.  Apply a moisturizing lotion or petroleum jelly (that does not contain alcohol and is unscented) to the skin on your feet and to dry, brittle toenails. Do not apply lotion between your toes.  Trim your toenails straight across. Do not dig under them or around the cuticle. File the edges of your nails with an emery board or nail file.  Do not cut corns or calluses or try to remove them with medicine.  Wear clean socks or stockings every day. Make sure they are not too tight. Do not wear knee-high stockings since they may decrease blood flow to your legs.  Wear shoes that fit properly and have enough cushioning. To break in new shoes, wear them for just a few hours a day. This prevents you from injuring your feet. Always look in your shoes before you put them on to be sure there are no objects inside.  Do not cross your legs. This may decrease the blood flow to your feet.  If you find a minor scrape,  cut, or break in the skin on your feet, keep it and the skin around it clean and dry. These areas may be cleansed with mild soap and water. Do not cleanse the area with peroxide, alcohol, or iodine.  When you remove an adhesive bandage, be sure not to damage the skin around it.  If you have a wound, look at it several times a day to make sure it is healing.  Do not use heating pads or hot water bottles. They may burn your skin. If you have lost feeling in your feet or legs, you may not know it is happening until it is too late.  Make sure your health care provider performs a complete foot exam at least annually or more often if you have foot problems. Report any cuts, sores, or bruises to your health care provider immediately. SEEK MEDICAL CARE IF:   You have an injury that is not healing.  You have cuts or breaks in the skin.  You have an ingrown nail.  You notice redness on your legs or feet.  You feel burning or tingling in your legs or feet.  You have pain or cramps in your legs and feet.  Your legs or feet are numb.  Your feet always feel cold. SEEK IMMEDIATE MEDICAL CARE IF:   There is increasing redness,   swelling, or pain in or around a wound.  There is a red line that goes up your leg.  Pus is coming from a wound.  You develop a fever or as directed by your health care provider.  You notice a bad smell coming from an ulcer or wound.   This information is not intended to replace advice given to you by your health care provider. Make sure you discuss any questions you have with your health care provider.   Document Released: 08/20/2000 Document Revised: 04/25/2013 Document Reviewed: 01/30/2013 Elsevier Interactive Patient Education 2016 Elsevier Inc.  

## 2016-07-09 NOTE — Progress Notes (Signed)
   Subjective:    Patient ID: Cullin Guenette, male    DOB: Aug 03, 1961, 55 y.o.   MRN: [id number]  HPI  55 year old male presents the office today for concerns of thick, painful, elongated toenails that he cannot trim himself. Also he does state that he has some raw areas in between his toes. He denies any swelling or redness. There is no claudication symptoms. Last A1c was 5.8.  Review of Systems  All other systems reviewed and are negative.      Objective:   Physical Exam General: AAO x3, NAD  Dermatological: Nails are hypertrophic, dystrophic, brittle, discolored, elongated 10. No surrounding redness or drainage. Tenderness nails 1-5 bilaterally. Interdigital maceration present interspaces. There is no open lesion identified there is no drainage or pus. No swelling. No signs of infection otherwise. No open lesions or pre-ulcerative lesions are identified today.   Vascular: Dorsalis Pedis artery and Posterior Tibial artery pedal pulses are 2/4 bilateral with immedate capillary fill time.There is no pain with calf compression, swelling, warmth, erythema.   Neruologic: Grossly intact via light touch bilateral. Vibratory intact via tuning fork bilateral. Protective threshold with Semmes Wienstein monofilament intact to all pedal sites bilateral.   Musculoskeletal: No gross boney pedal deformities bilateral. No pain, crepitus, or limitation noted with foot and ankle range of motion bilateral. Muscular strength 5/5 in all groups tested bilateral.  Gait: Unassisted, Nonantalgic.      Assessment & Plan:  55 year old male with symptomatic onychomycosis, interdigital maceration. -Treatment options discussed including all alternatives, risks, and complications -Etiology of symptoms were discussed -Nails debrided 10 without complications or bleeding. -Daily foot inspection -Castellani's paint applied interdigitally. Discussed them shoes/socket modifications. Dry thoroughly between the  toes. -Follow-up in 3 months or sooner if any problems arise. In the meantime, encouraged to call the office with any questions, concerns, change in symptoms.   Ovid Curd, DPM

## 2016-07-12 ENCOUNTER — Other Ambulatory Visit: Payer: Self-pay | Admitting: Physician Assistant

## 2016-07-14 ENCOUNTER — Other Ambulatory Visit: Payer: Self-pay | Admitting: Internal Medicine

## 2016-07-14 ENCOUNTER — Telehealth: Payer: Self-pay | Admitting: *Deleted

## 2016-07-14 DIAGNOSIS — L02212 Cutaneous abscess of back [any part, except buttock]: Secondary | ICD-10-CM

## 2016-07-14 NOTE — Telephone Encounter (Signed)
Patient verified DOB Patient is aware of MRI showing subcutaneous abscess with blood collection being present also. Patient is informed of site not being disc or bone infection. Patient states drainage began again last night. Patient has taken antibiotics and has not seen improvement. Patient is aware of concern being routed to PCP for advise or a FU appointment or referral. No further questions at this time.

## 2016-07-14 NOTE — Telephone Encounter (Signed)
Patient has been referred to surgery.

## 2016-07-14 NOTE — Telephone Encounter (Signed)
-----   Message from Quentin Angst, MD sent at 07/14/2016 12:31 PM EST ----- Please inform patient that his MRI showed possible subcutaneous abscess. Blood collection also possible. No evidence of disc or bone infection. If swelling or drainage persists, please return to clinic for referral to general surgery for Incision and Drainage.

## 2016-07-14 NOTE — Telephone Encounter (Signed)
Thank you. I will let him know no FU appointment is needed here at [facility]. Patient should await referral appointment.

## 2016-07-19 ENCOUNTER — Ambulatory Visit: Payer: Self-pay | Admitting: General Surgery

## 2016-07-21 ENCOUNTER — Telehealth: Payer: Self-pay | Admitting: *Deleted

## 2016-07-21 NOTE — Telephone Encounter (Signed)
[employer] Authorization confirmation number: [id number] Procedure: I&D procedure (07/19/16-01/15/17)

## 2016-08-19 ENCOUNTER — Encounter (HOSPITAL_BASED_OUTPATIENT_CLINIC_OR_DEPARTMENT_OTHER): Payer: Self-pay | Admitting: *Deleted

## 2016-08-19 NOTE — Progress Notes (Addendum)
Pt coming for BMET,CBC,Diff and EKG and then CXR per Dr. Kirt Boys order. Bring all medications. Dr. Desmond Lope reviewed  Cardiac history and echo - ok for surgery.

## 2016-08-20 ENCOUNTER — Encounter (HOSPITAL_BASED_OUTPATIENT_CLINIC_OR_DEPARTMENT_OTHER)
Admission: RE | Admit: 2016-08-20 | Discharge: 2016-08-20 | Disposition: A | Discharge status: Home / Self Care | Payer: Commercial Managed Care - HMO | Source: Ambulatory Visit | Attending: General Surgery | Admitting: General Surgery

## 2016-08-20 ENCOUNTER — Other Ambulatory Visit: Payer: Self-pay | Admitting: General Surgery

## 2016-08-20 ENCOUNTER — Ambulatory Visit
Admission: RE | Admit: 2016-08-20 | Discharge: 2016-08-20 | Disposition: A | Discharge status: Home / Self Care | Payer: Commercial Managed Care - HMO | Source: Ambulatory Visit | Attending: General Surgery | Admitting: General Surgery

## 2016-08-20 ENCOUNTER — Other Ambulatory Visit: Payer: Self-pay

## 2016-08-20 DIAGNOSIS — Z01812 Encounter for preprocedural laboratory examination: Secondary | ICD-10-CM | POA: Insufficient documentation

## 2016-08-20 DIAGNOSIS — Z01810 Encounter for preprocedural cardiovascular examination: Secondary | ICD-10-CM | POA: Insufficient documentation

## 2016-08-20 DIAGNOSIS — Z01818 Encounter for other preprocedural examination: Secondary | ICD-10-CM

## 2016-08-20 LAB — BASIC METABOLIC PANEL
ANION GAP: 11 (ref 5–15)
BUN: 18 mg/dL (ref 6–20)
CHLORIDE: 107 mmol/L (ref 101–111)
CO2: 22 mmol/L (ref 22–32)
Calcium: 9.8 mg/dL (ref 8.9–10.3)
Creatinine, Ser: 1.02 mg/dL (ref 0.61–1.24)
GFR calc Af Amer: >60 mL/min (ref >60)
GFR calc non Af Amer: >60 mL/min (ref >60)
Glucose, Bld: 128 mg/dL — ABNORMAL HIGH (ref 65–99)
POTASSIUM: 4.3 mmol/L (ref 3.5–5.1)
SODIUM: 140 mmol/L (ref 135–145)

## 2016-08-20 LAB — CBC WITH DIFFERENTIAL/PLATELET
Basophils Absolute: 0.1 10*3/uL (ref 0.0–0.1)
Basophils Relative: 1 %
EOS PCT: 4 %
Eosinophils Absolute: 0.3 10*3/uL (ref 0.0–0.7)
HEMATOCRIT: 36.9 % — AB (ref 39.0–52.0)
HEMOGLOBIN: 12.6 g/dL — AB (ref 13.0–17.0)
LYMPHS ABS: 3.4 10*3/uL (ref 0.7–4.0)
LYMPHS PCT: 45 %
MCH: 30.7 pg (ref 26.0–34.0)
MCHC: 34.1 g/dL (ref 30.0–36.0)
MCV: 89.8 fL (ref 78.0–100.0)
Monocytes Absolute: 0.6 10*3/uL (ref 0.1–1.0)
Monocytes Relative: 8 %
NEUTROS ABS: 3.1 10*3/uL (ref 1.7–7.7)
Neutrophils Relative %: 42 %
PLATELETS: 303 10*3/uL (ref 150–400)
RBC: 4.11 MIL/uL — AB (ref 4.22–5.81)
RDW: 14.6 % (ref 11.5–15.5)
WBC: 7.4 10*3/uL (ref 4.0–10.5)

## 2016-08-24 ENCOUNTER — Encounter (HOSPITAL_BASED_OUTPATIENT_CLINIC_OR_DEPARTMENT_OTHER)
Admission: RE | Disposition: A | Discharge status: Home / Self Care | Payer: Self-pay | Source: Ambulatory Visit | Attending: General Surgery

## 2016-08-24 ENCOUNTER — Encounter (HOSPITAL_BASED_OUTPATIENT_CLINIC_OR_DEPARTMENT_OTHER): Payer: Self-pay

## 2016-08-24 ENCOUNTER — Ambulatory Visit (HOSPITAL_BASED_OUTPATIENT_CLINIC_OR_DEPARTMENT_OTHER)
Admission: RE | Admit: 2016-08-24 | Discharge: 2016-08-24 | Disposition: A | Discharge status: Home / Self Care | Payer: Commercial Managed Care - HMO | Source: Ambulatory Visit | Attending: General Surgery | Admitting: General Surgery

## 2016-08-24 ENCOUNTER — Ambulatory Visit (HOSPITAL_BASED_OUTPATIENT_CLINIC_OR_DEPARTMENT_OTHER): Payer: Commercial Managed Care - HMO | Admitting: Anesthesiology

## 2016-08-24 DIAGNOSIS — I110 Hypertensive heart disease with heart failure: Secondary | ICD-10-CM | POA: Diagnosis not present

## 2016-08-24 DIAGNOSIS — E669 Obesity, unspecified: Secondary | ICD-10-CM | POA: Insufficient documentation

## 2016-08-24 DIAGNOSIS — L723 Sebaceous cyst: Secondary | ICD-10-CM | POA: Insufficient documentation

## 2016-08-24 DIAGNOSIS — I2510 Atherosclerotic heart disease of native coronary artery without angina pectoris: Secondary | ICD-10-CM | POA: Insufficient documentation

## 2016-08-24 DIAGNOSIS — M1990 Unspecified osteoarthritis, unspecified site: Secondary | ICD-10-CM | POA: Diagnosis not present

## 2016-08-24 DIAGNOSIS — E119 Type 2 diabetes mellitus without complications: Secondary | ICD-10-CM | POA: Diagnosis not present

## 2016-08-24 DIAGNOSIS — I509 Heart failure, unspecified: Secondary | ICD-10-CM | POA: Insufficient documentation

## 2016-08-24 DIAGNOSIS — Z8673 Personal history of transient ischemic attack (TIA), and cerebral infarction without residual deficits: Secondary | ICD-10-CM | POA: Insufficient documentation

## 2016-08-24 DIAGNOSIS — Z6834 Body mass index (BMI) 34.0-34.9, adult: Secondary | ICD-10-CM | POA: Diagnosis not present

## 2016-08-24 DIAGNOSIS — I252 Old myocardial infarction: Secondary | ICD-10-CM | POA: Diagnosis not present

## 2016-08-24 DIAGNOSIS — D649 Anemia, unspecified: Secondary | ICD-10-CM | POA: Insufficient documentation

## 2016-08-24 DIAGNOSIS — L02212 Cutaneous abscess of back [any part, except buttock]: Secondary | ICD-10-CM | POA: Diagnosis not present

## 2016-08-24 DIAGNOSIS — K219 Gastro-esophageal reflux disease without esophagitis: Secondary | ICD-10-CM | POA: Diagnosis not present

## 2016-08-24 DIAGNOSIS — Z7984 Long term (current) use of oral hypoglycemic drugs: Secondary | ICD-10-CM | POA: Diagnosis not present

## 2016-08-24 DIAGNOSIS — Z79899 Other long term (current) drug therapy: Secondary | ICD-10-CM | POA: Diagnosis not present

## 2016-08-24 HISTORY — DX: Cerebral infarction, unspecified: I63.9

## 2016-08-24 HISTORY — DX: Acute myocardial infarction, unspecified: I21.9

## 2016-08-24 HISTORY — DX: Anemia, unspecified: D64.9

## 2016-08-24 HISTORY — DX: Atherosclerotic heart disease of native coronary artery without angina pectoris: I25.10

## 2016-08-24 HISTORY — PX: CYST REMOVAL TRUNK: SHX6283

## 2016-08-24 LAB — GLUCOSE, CAPILLARY
GLUCOSE-CAPILLARY: 114 mg/dL — AB (ref 65–99)
GLUCOSE-CAPILLARY: 141 mg/dL — AB (ref 65–99)

## 2016-08-24 SURGERY — CYST REMOVAL TRUNK
Anesthesia: General

## 2016-08-24 MED ORDER — PROMETHAZINE HCL 25 MG/ML IJ SOLN: 6.2500 mg | INTRAMUSCULAR | Status: DC | PRN

## 2016-08-24 MED ORDER — EPHEDRINE 5 MG/ML INJ
INTRAVENOUS | Status: AC
  Filled 2016-08-24: qty 10

## 2016-08-24 MED ORDER — PROPOFOL 10 MG/ML IV BOLUS
INTRAVENOUS | Status: AC
  Filled 2016-08-24: qty 20

## 2016-08-24 MED ORDER — SUCCINYLCHOLINE CHLORIDE 20 MG/ML IJ SOLN
INTRAMUSCULAR | Status: DC | PRN
  Administered 2016-08-24: 100 mg via INTRAVENOUS

## 2016-08-24 MED ORDER — POVIDONE-IODINE 10 % EX OINT
TOPICAL_OINTMENT | CUTANEOUS | Status: AC
  Filled 2016-08-24: qty 28.35

## 2016-08-24 MED ORDER — POVIDONE-IODINE 10 % EX OINT
TOPICAL_OINTMENT | CUTANEOUS | Status: DC | PRN
  Administered 2016-08-24: 1 via TOPICAL

## 2016-08-24 MED ORDER — PROPOFOL 10 MG/ML IV BOLUS
INTRAVENOUS | Status: DC | PRN
  Administered 2016-08-24: 200 mg via INTRAVENOUS

## 2016-08-24 MED ORDER — FENTANYL CITRATE (PF) 100 MCG/2ML IJ SOLN
INTRAMUSCULAR | Status: AC
  Filled 2016-08-24: qty 2

## 2016-08-24 MED ORDER — LACTATED RINGERS IV SOLN
INTRAVENOUS | Status: DC
  Administered 2016-08-24 (×2): via INTRAVENOUS

## 2016-08-24 MED ORDER — ACETAMINOPHEN 500 MG PO TABS
1000.0000 mg | ORAL_TABLET | ORAL | Status: AC
  Administered 2016-08-24: 1000 mg via ORAL

## 2016-08-24 MED ORDER — FENTANYL CITRATE (PF) 100 MCG/2ML IJ SOLN
50.0000 ug | INTRAMUSCULAR | Status: DC | PRN
  Administered 2016-08-24: 100 ug via INTRAVENOUS

## 2016-08-24 MED ORDER — LABETALOL HCL 5 MG/ML IV SOLN
INTRAVENOUS | Status: AC
Start: 2016-08-24 — End: 2016-08-24
  Filled 2016-08-24: qty 4

## 2016-08-24 MED ORDER — DOXYCYCLINE MONOHYDRATE 100 MG PO TABS: 100.0000 mg | ORAL_TABLET | Freq: Two times a day (BID) | ORAL | 1 refills | Status: AC

## 2016-08-24 MED ORDER — LIDOCAINE HCL (PF) 1 % IJ SOLN
INTRAMUSCULAR | Status: AC
  Filled 2016-08-24: qty 5

## 2016-08-24 MED ORDER — BUPIVACAINE-EPINEPHRINE 0.25% -1:200000 IJ SOLN
INTRAMUSCULAR | Status: DC | PRN
  Administered 2016-08-24: 20 mL

## 2016-08-24 MED ORDER — ACETAMINOPHEN 500 MG PO TABS
ORAL_TABLET | ORAL | Status: AC
  Filled 2016-08-24: qty 2

## 2016-08-24 MED ORDER — CHLORHEXIDINE GLUCONATE CLOTH 2 % EX PADS: 6.0000 | MEDICATED_PAD | Freq: Once | CUTANEOUS | Status: DC

## 2016-08-24 MED ORDER — VANCOMYCIN HCL IN DEXTROSE 1-5 GM/200ML-% IV SOLN
INTRAVENOUS | Status: AC
  Filled 2016-08-24: qty 200

## 2016-08-24 MED ORDER — ONDANSETRON HCL 4 MG/2ML IJ SOLN
INTRAMUSCULAR | Status: AC
  Filled 2016-08-24: qty 2

## 2016-08-24 MED ORDER — LIDOCAINE 2% (20 MG/ML) 5 ML SYRINGE
INTRAMUSCULAR | Status: AC
  Filled 2016-08-24: qty 5

## 2016-08-24 MED ORDER — VANCOMYCIN HCL 10 G IV SOLR
1500.0000 mg | INTRAVENOUS | Status: AC
  Administered 2016-08-24: 1500 mg via INTRAVENOUS

## 2016-08-24 MED ORDER — BUPIVACAINE HCL (PF) 0.25 % IJ SOLN
INTRAMUSCULAR | Status: AC
  Filled 2016-08-24: qty 30

## 2016-08-24 MED ORDER — ONDANSETRON HCL 4 MG/2ML IJ SOLN
INTRAMUSCULAR | Status: DC | PRN
  Administered 2016-08-24: 4 mg via INTRAVENOUS

## 2016-08-24 MED ORDER — MIDAZOLAM HCL 2 MG/2ML IJ SOLN
1.0000 mg | INTRAMUSCULAR | Status: DC | PRN
  Administered 2016-08-24: 2 mg via INTRAVENOUS

## 2016-08-24 MED ORDER — FENTANYL CITRATE (PF) 100 MCG/2ML IJ SOLN: 25.0000 ug | INTRAMUSCULAR | Status: DC | PRN

## 2016-08-24 MED ORDER — LABETALOL HCL 5 MG/ML IV SOLN
INTRAVENOUS | Status: DC | PRN
  Administered 2016-08-24: 2.5 mg via INTRAVENOUS

## 2016-08-24 MED ORDER — MIDAZOLAM HCL 2 MG/2ML IJ SOLN
INTRAMUSCULAR | Status: AC
  Filled 2016-08-24: qty 2

## 2016-08-24 MED ORDER — OXYCODONE HCL 5 MG PO TABS: 5.0000 mg | ORAL_TABLET | Freq: Four times a day (QID) | ORAL | 0 refills | Status: DC | PRN

## 2016-08-24 MED ORDER — LIDOCAINE HCL (CARDIAC) 20 MG/ML IV SOLN
INTRAVENOUS | Status: DC | PRN
Start: 2016-08-24 — End: 2016-08-24
  Administered 2016-08-24: 50 mg via INTRAVENOUS

## 2016-08-24 MED ORDER — PHENYLEPHRINE HCL 10 MG/ML IJ SOLN
INTRAMUSCULAR | Status: DC | PRN
  Administered 2016-08-24 (×4): 80 ug via INTRAVENOUS

## 2016-08-24 MED ORDER — VANCOMYCIN HCL IN DEXTROSE 500-5 MG/100ML-% IV SOLN
INTRAVENOUS | Status: AC
Start: 2016-08-24 — End: 2016-08-24
  Filled 2016-08-24: qty 100

## 2016-08-24 MED ORDER — BUPIVACAINE-EPINEPHRINE (PF) 0.25% -1:200000 IJ SOLN
INTRAMUSCULAR | Status: AC
  Filled 2016-08-24: qty 30

## 2016-08-24 MED ORDER — SCOPOLAMINE 1 MG/3DAYS TD PT72: 1.0000 | MEDICATED_PATCH | Freq: Once | TRANSDERMAL | Status: DC | PRN

## 2016-08-24 MED ORDER — EPHEDRINE SULFATE 50 MG/ML IJ SOLN
INTRAMUSCULAR | Status: DC | PRN
  Administered 2016-08-24: 5 mg via INTRAVENOUS

## 2016-08-24 MED ORDER — PHENYLEPHRINE 40 MCG/ML (10ML) SYRINGE FOR IV PUSH (FOR BLOOD PRESSURE SUPPORT)
PREFILLED_SYRINGE | INTRAVENOUS | Status: AC
  Filled 2016-08-24: qty 10

## 2016-08-24 SURGICAL SUPPLY — 61 items
ADH SKN CLS APL DERMABOND .7 (GAUZE/BANDAGES/DRESSINGS)
APL SKNCLS STERI-STRIP NONHPOA (GAUZE/BANDAGES/DRESSINGS)
BENZOIN TINCTURE PRP APPL 2/3 (GAUZE/BANDAGES/DRESSINGS) IMPLANT
BLADE CLIPPER SURG (BLADE) IMPLANT
BLADE SURG 15 STRL LF DISP TIS (BLADE) ×1 IMPLANT
BLADE SURG 15 STRL SS (BLADE) ×3
BNDG ADH 5X4 AIR PERM ELC (GAUZE/BANDAGES/DRESSINGS)
BNDG COHESIVE 4X5 WHT NS (GAUZE/BANDAGES/DRESSINGS) IMPLANT
BNDG GAUZE ELAST 4 BULKY (GAUZE/BANDAGES/DRESSINGS) IMPLANT
CANISTER SUCT 1200ML W/VALVE (MISCELLANEOUS) IMPLANT
CHLORAPREP W/TINT 26ML (MISCELLANEOUS) ×3 IMPLANT
CLEANER CAUTERY TIP 5X5 PAD (MISCELLANEOUS) ×1 IMPLANT
CLOSURE WOUND 1/2 X4 (GAUZE/BANDAGES/DRESSINGS)
CLOSURE WOUND 1/4X4 (GAUZE/BANDAGES/DRESSINGS)
COVER BACK TABLE 60X90IN (DRAPES) ×3 IMPLANT
COVER MAYO STAND STRL (DRAPES) ×3 IMPLANT
DECANTER SPIKE VIAL GLASS SM (MISCELLANEOUS) IMPLANT
DERMABOND ADVANCED (GAUZE/BANDAGES/DRESSINGS)
DERMABOND ADVANCED .7 DNX12 (GAUZE/BANDAGES/DRESSINGS) IMPLANT
DRAPE LAPAROTOMY T 102X78X121 (DRAPES) ×3 IMPLANT
DRAPE UTILITY XL STRL (DRAPES) ×4 IMPLANT
DRSG PAD ABDOMINAL 8X10 ST (GAUZE/BANDAGES/DRESSINGS) ×2 IMPLANT
DRSG TEGADERM 4X4.75 (GAUZE/BANDAGES/DRESSINGS) ×3 IMPLANT
ELECT REM PT RETURN 9FT ADLT (ELECTROSURGICAL) ×3
ELECTRODE REM PT RTRN 9FT ADLT (ELECTROSURGICAL) ×1 IMPLANT
GLOVE BIOGEL PI IND STRL 7.0 (GLOVE) IMPLANT
GLOVE BIOGEL PI IND STRL 8 (GLOVE) ×1 IMPLANT
GLOVE BIOGEL PI INDICATOR 7.0 (GLOVE) ×4
GLOVE BIOGEL PI INDICATOR 8 (GLOVE) ×2
GLOVE ECLIPSE 6.5 STRL STRAW (GLOVE) ×2 IMPLANT
GLOVE ECLIPSE 7.5 STRL STRAW (GLOVE) ×3 IMPLANT
GOWN STRL REUS W/ TWL LRG LVL3 (GOWN DISPOSABLE) ×1 IMPLANT
GOWN STRL REUS W/TWL LRG LVL3 (GOWN DISPOSABLE) ×3
NDL HYPO 25X1 1.5 SAFETY (NEEDLE) ×1 IMPLANT
NEEDLE HYPO 25X1 1.5 SAFETY (NEEDLE) ×3 IMPLANT
NS IRRIG 1000ML POUR BTL (IV SOLUTION) ×3 IMPLANT
PACK BASIN DAY SURGERY FS (CUSTOM PROCEDURE TRAY) ×3 IMPLANT
PAD CLEANER CAUTERY TIP 5X5 (MISCELLANEOUS) ×2
PENCIL BUTTON HOLSTER BLD 10FT (ELECTRODE) ×3 IMPLANT
SHEET MEDIUM DRAPE 40X70 STRL (DRAPES) IMPLANT
STRIP CLOSURE SKIN 1/2X4 (GAUZE/BANDAGES/DRESSINGS) IMPLANT
STRIP CLOSURE SKIN 1/4X4 (GAUZE/BANDAGES/DRESSINGS) IMPLANT
SUCTION FRAZIER HANDLE 10FR (MISCELLANEOUS)
SUCTION TUBE FRAZIER 10FR DISP (MISCELLANEOUS) IMPLANT
SUT ETHILON 3 0 PS 1 (SUTURE) ×4 IMPLANT
SUT ETHILON 4 0 PS 2 18 (SUTURE) ×1 IMPLANT
SUT MON AB 4-0 PC3 18 (SUTURE) ×3 IMPLANT
SUT PROLENE 4 0 PS 2 18 (SUTURE) IMPLANT
SUT VIC AB 2-0 SH 27 (SUTURE) ×9
SUT VIC AB 2-0 SH 27XBRD (SUTURE) ×2 IMPLANT
SUT VIC AB 3-0 SH 27 (SUTURE) ×6
SUT VIC AB 3-0 SH 27X BRD (SUTURE) IMPLANT
SUT VIC AB 4-0 SH 27 (SUTURE)
SUT VIC AB 4-0 SH 27XANBCTRL (SUTURE) IMPLANT
SYR BULB 3OZ (MISCELLANEOUS) ×2 IMPLANT
SYR CONTROL 10ML LL (SYRINGE) ×3 IMPLANT
TOWEL OR 17X24 6PK STRL BLUE (TOWEL DISPOSABLE) ×6 IMPLANT
TOWEL OR NON WOVEN STRL DISP B (DISPOSABLE) ×3 IMPLANT
TUBE CONNECTING 20'X1/4 (TUBING) ×1
TUBE CONNECTING 20X1/4 (TUBING) ×1 IMPLANT
YANKAUER SUCT BULB TIP NO VENT (SUCTIONS) ×2 IMPLANT
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
## 2016-08-24 NOTE — Anesthesia Preprocedure Evaluation (Signed)
Anesthesia Evaluation  Patient identified by MRN, date of birth, ID band Patient awake    Reviewed: Allergy & Precautions, NPO status , Patient's Chart, lab work & pertinent test results, reviewed documented beta blocker date and time   Airway Mallampati: III  TM Distance: >3 FB Neck ROM: Full    Dental  (+) Teeth Intact, Dental Advisory Given, Missing, Chipped,    Pulmonary neg pulmonary ROS,    Pulmonary exam normal breath sounds clear to auscultation       Cardiovascular hypertension, Pt. on medications and Pt. on home beta blockers + CAD and + Past MI  + Valvular Problems/Murmurs (s/p AVR)  Rhythm:Regular Rate:Normal + Systolic murmurs    Neuro/Psych CVA, No Residual Symptoms negative psych ROS   GI/Hepatic Neg liver ROS, GERD  Medicated and Controlled,  Endo/Other  diabetes, Type 2, Oral Hypoglycemic AgentsObesity   Renal/GU negative Renal ROS     Musculoskeletal  (+) Arthritis , Osteoarthritis,    Abdominal   Peds  Hematology  (+) Blood dyscrasia (Plavix), anemia ,   Anesthesia Other Findings Day of surgery medications reviewed with the patient.  Reproductive/Obstetrics                             Anesthesia Physical Anesthesia Plan  ASA: III  Anesthesia Plan: General   Post-op Pain Management:    Induction: Intravenous  Airway Management Planned: Oral ETT  Additional Equipment:   Intra-op Plan:   Post-operative Plan: Extubation in OR  Informed Consent: I have reviewed the patients History and Physical, chart, labs and discussed the procedure including the risks, benefits and alternatives for the proposed anesthesia with the patient or authorized representative who has indicated his/her understanding and acceptance.   Dental advisory given  Plan Discussed with: CRNA  Anesthesia Plan Comments: (Risks/benefits of general anesthesia discussed with patient including risk  of damage to teeth, lips, gum, and tongue, nausea/vomiting, allergic reactions to medications, and the possibility of heart attack, stroke and death.  All patient questions answered.  Patient wishes to proceed.)        Anesthesia Quick Evaluation

## 2016-08-24 NOTE — Discharge Instructions (Signed)

## 2016-08-24 NOTE — Transfer of Care (Signed)
Immediate Anesthesia Transfer of Care Note  Patient: [name]  Procedure(s) Performed: Procedure(s): EXCISION OF BACK ABSCESS (N/A)  Patient Location: PACU  Anesthesia Type:General  Level of Consciousness: awake  Airway & Oxygen Therapy: Patient Spontanous Breathing and Patient connected to face mask oxygen  Post-op Assessment: Report given to RN and Post -op Vital signs reviewed and stable  Post vital signs: Reviewed and stable  Last Vitals:  Vitals:   08/24/16 1549 08/24/16 1550  BP:    Pulse: 74 72  Resp:  15  Temp:      Last Pain:  Vitals:   08/24/16 1144  TempSrc: Oral  PainSc: 10-Worst pain ever         Complications: No apparent anesthesia complications

## 2016-08-24 NOTE — Anesthesia Postprocedure Evaluation (Signed)
Anesthesia Post Note  Patient: [name]  Procedure(s) Performed: Procedure(s) (LRB): EXCISION OF BACK ABSCESS (N/A)  Patient location during evaluation: PACU Anesthesia Type: General Level of consciousness: awake and alert Pain management: pain level controlled Vital Signs Assessment: post-procedure vital signs reviewed and stable Respiratory status: spontaneous breathing, nonlabored ventilation, respiratory function stable and patient connected to nasal cannula oxygen Cardiovascular status: blood pressure returned to baseline and stable Postop Assessment: no signs of nausea or vomiting Anesthetic complications: no       Last Vitals:  Vitals:   08/24/16 1621 08/24/16 1637  BP:  (!) 165/110  Pulse: 76 74  Resp: 11 16  Temp:  36.8 C    Last Pain:  Vitals:   08/24/16 1637  TempSrc: Oral  PainSc: 0-No pain                 Cecile Hearing

## 2016-08-24 NOTE — Op Note (Signed)
OPERATIVE REPORT  DATE OF OPERATION: 08/24/2016  PATIENT:  [name]  55 y.o. male  PRE-OPERATIVE DIAGNOSIS:  CHRONIC ABSCESS OF BACK  POST-OPERATIVE DIAGNOSIS:  CHRONIC ABSCESS OF BACK  INDICATION(S) FOR OPERATION:  Recurrently infected deep sebaceous cyst of the mid-back  FINDINGS:  Deep cavity, purulent fluid in the cavity with minimal spillage at excision  PROCEDURE:  Procedure(s): EXCISION OF BACK ABSCESS  SURGEON:  Surgeon(s): Jimmye Norman, MD  ASSISTANT: None  ANESTHESIA:   general  COMPLICATIONS:  Spillage of cavity contents into the wound  EBL: <50 ml  BLOOD ADMINISTERED: none  DRAINS: none   SPECIMEN:  Source of Specimen:  Excised skin and subcutaneous tissue surrounding the infected sebaceosu cyst  COUNTS CORRECT:  YES  PROCEDURE DETAILS: The patient was taken to the operating room and placed initially on the table in supine position. After an adequate general endotracheal anesthetic was administered he was prepped and draped in usual sterile manner exposing the cyst on his back.  A proper timeout was performed identifying the patient and procedure to be performed. His back was prepped and draped in usual sterile manner. We measured the area of the excision from top to bottom total length was approximately 14 and half centimeters long. We made the incision with a #15 blade and taken down to the subcutaneous tissue. On the left side of the incision deep in the subcutaneous tissue those a small track lung laterally that we entered allowing spillage of a small amount of pus away. We subsequently excised the cyst down to the fascia of the mid back. Her gait with copious amounts saline change instruments and gloves and subsequently closed. A subsequent incision line was proximally 14 cm in length. A total of 86 horizontal mattress sutures of 3-0 nylon were used on the skin and the layers were 2-0 Vicryl deeply and 3-0 Vicryl and more superficial subcutaneous  area.  Sterile dressing was applied including Betadine ointment. All needle counts, sponge counts, and instrument counts were correct. The patient will be placed on postoperative antibiotics.  PATIENT DISPOSITION:  PACU - hemodynamically stable.   Keeshia Sanderlin 12/19/20173:49 PM

## 2016-08-24 NOTE — Anesthesia Procedure Notes (Signed)
Procedure Name: Intubation Date/Time: 08/24/2016 2:44 PM Performed by: Caren Macadam Pre-anesthesia Checklist: Patient identified, Emergency Drugs available, Suction available and Patient being monitored Patient Re-evaluated:Patient Re-evaluated prior to inductionOxygen Delivery Method: Circle system utilized Preoxygenation: Pre-oxygenation with 100% oxygen Intubation Type: IV induction Ventilation: Mask ventilation without difficulty Laryngoscope Size: Miller and 2 Grade View: Grade I Tube type: Oral Tube size: 8.0 mm Number of attempts: 1 Airway Equipment and Method: Stylet and Oral airway Placement Confirmation: ETT inserted through vocal cords under direct vision,  positive ETCO2 and breath sounds checked- equal and bilateral Secured at: 23 cm Tube secured with: Tape Dental Injury: Teeth and Oropharynx as per pre-operative assessment

## 2016-08-25 ENCOUNTER — Encounter (HOSPITAL_BASED_OUTPATIENT_CLINIC_OR_DEPARTMENT_OTHER): Payer: Self-pay | Admitting: General Surgery

## 2016-09-01 ENCOUNTER — Encounter (HOSPITAL_BASED_OUTPATIENT_CLINIC_OR_DEPARTMENT_OTHER): Payer: Self-pay | Admitting: General Surgery

## 2016-09-16 ENCOUNTER — Other Ambulatory Visit: Payer: Self-pay | Admitting: Internal Medicine

## 2016-09-16 DIAGNOSIS — M1000 Idiopathic gout, unspecified site: Secondary | ICD-10-CM

## 2016-09-22 ENCOUNTER — Ambulatory Visit: Payer: Self-pay | Admitting: Internal Medicine

## 2016-09-29 ENCOUNTER — Ambulatory Visit: Payer: Self-pay | Admitting: Internal Medicine

## 2016-10-15 ENCOUNTER — Encounter: Payer: Self-pay | Admitting: Podiatry

## 2016-10-15 ENCOUNTER — Ambulatory Visit (INDEPENDENT_AMBULATORY_CARE_PROVIDER_SITE_OTHER): Payer: PPO | Admitting: Podiatry

## 2016-10-15 DIAGNOSIS — M79675 Pain in left toe(s): Secondary | ICD-10-CM | POA: Diagnosis not present

## 2016-10-15 DIAGNOSIS — B351 Tinea unguium: Secondary | ICD-10-CM

## 2016-10-15 DIAGNOSIS — M79674 Pain in right toe(s): Secondary | ICD-10-CM | POA: Diagnosis not present

## 2016-10-17 NOTE — Progress Notes (Signed)
Subjective: 56 y.o. returns the office today for painful, elongated, thickened toenails which he cannot trim himself. Denies any redness or drainage around the nails. Denies any acute changes since last appointment and no new complaints today. Denies any systemic complaints such as fevers, chills, nausea, vomiting.   Objective: AAO 3, NAD DP/PT pulses palpable, CRT less than 3 seconds Nails hypertrophic, dystrophic, elongated, brittle, discolored 10. There is tenderness overlying the nails 1-5 bilaterally. There is no surrounding erythema or drainage along the nail sites. No open lesions or pre-ulcerative lesions are identified. No other areas of tenderness bilateral lower extremities. No overlying edema, erythema, increased warmth. No pain with calf compression, swelling, warmth, erythema.  Assessment: Patient presents with symptomatic onychomycosis  Plan: -Treatment options including alternatives, risks, complications were discussed -Nails sharply debrided 10 without complication/bleeding. -Discussed daily foot inspection. If there are any changes, to call the office immediately.  -Follow-up in 3 months or sooner if any problems are to arise. In the meantime, encouraged to call the office with any questions, concerns, changes symptoms.  Ovid Curd, DPM

## 2016-10-23 ENCOUNTER — Other Ambulatory Visit: Payer: Self-pay | Admitting: Physician Assistant

## 2016-10-23 ENCOUNTER — Other Ambulatory Visit: Payer: Self-pay | Admitting: Internal Medicine

## 2016-10-23 DIAGNOSIS — M1000 Idiopathic gout, unspecified site: Secondary | ICD-10-CM

## 2016-10-25 ENCOUNTER — Other Ambulatory Visit: Payer: Self-pay | Admitting: *Deleted

## 2016-10-25 MED ORDER — LISINOPRIL 5 MG PO TABS: 5.0000 mg | ORAL_TABLET | Freq: Every day | ORAL | 0 refills | Status: DC

## 2016-10-25 MED ORDER — POTASSIUM CHLORIDE CRYS ER 20 MEQ PO TBCR: 20.0000 meq | EXTENDED_RELEASE_TABLET | Freq: Every day | ORAL | 0 refills | Status: DC

## 2016-11-03 ENCOUNTER — Encounter: Payer: Self-pay | Admitting: Gastroenterology

## 2016-11-03 ENCOUNTER — Encounter: Payer: Self-pay | Admitting: Internal Medicine

## 2016-11-03 ENCOUNTER — Ambulatory Visit: Payer: PPO | Attending: Internal Medicine | Admitting: Internal Medicine

## 2016-11-03 VITALS — BP 128/87 | HR 77 | Temp 98.0°F | Resp 18 | Ht 66.0 in | Wt 229.2 lb

## 2016-11-03 DIAGNOSIS — I252 Old myocardial infarction: Secondary | ICD-10-CM | POA: Diagnosis not present

## 2016-11-03 DIAGNOSIS — I2510 Atherosclerotic heart disease of native coronary artery without angina pectoris: Secondary | ICD-10-CM | POA: Diagnosis not present

## 2016-11-03 DIAGNOSIS — M1000 Idiopathic gout, unspecified site: Secondary | ICD-10-CM | POA: Insufficient documentation

## 2016-11-03 DIAGNOSIS — Z951 Presence of aortocoronary bypass graft: Secondary | ICD-10-CM | POA: Diagnosis not present

## 2016-11-03 DIAGNOSIS — E785 Hyperlipidemia, unspecified: Secondary | ICD-10-CM | POA: Insufficient documentation

## 2016-11-03 DIAGNOSIS — Z1211 Encounter for screening for malignant neoplasm of colon: Secondary | ICD-10-CM | POA: Diagnosis not present

## 2016-11-03 DIAGNOSIS — Z8673 Personal history of transient ischemic attack (TIA), and cerebral infarction without residual deficits: Secondary | ICD-10-CM | POA: Insufficient documentation

## 2016-11-03 DIAGNOSIS — Z23 Encounter for immunization: Secondary | ICD-10-CM | POA: Insufficient documentation

## 2016-11-03 DIAGNOSIS — Z888 Allergy status to other drugs, medicaments and biological substances status: Secondary | ICD-10-CM | POA: Diagnosis not present

## 2016-11-03 DIAGNOSIS — Z79899 Other long term (current) drug therapy: Secondary | ICD-10-CM | POA: Diagnosis not present

## 2016-11-03 DIAGNOSIS — E119 Type 2 diabetes mellitus without complications: Secondary | ICD-10-CM | POA: Diagnosis not present

## 2016-11-03 DIAGNOSIS — K219 Gastro-esophageal reflux disease without esophagitis: Secondary | ICD-10-CM | POA: Insufficient documentation

## 2016-11-03 DIAGNOSIS — E7800 Pure hypercholesterolemia, unspecified: Secondary | ICD-10-CM | POA: Diagnosis not present

## 2016-11-03 DIAGNOSIS — I10 Essential (primary) hypertension: Secondary | ICD-10-CM | POA: Insufficient documentation

## 2016-11-03 LAB — GLUCOSE, POCT (MANUAL RESULT ENTRY): POC Glucose: 130 mg/dl — AB (ref 70–99)

## 2016-11-03 LAB — POCT GLYCOSYLATED HEMOGLOBIN (HGB A1C): Hemoglobin A1C: 5.8

## 2016-11-03 MED ORDER — POTASSIUM CHLORIDE CRYS ER 20 MEQ PO TBCR: 20.0000 meq | EXTENDED_RELEASE_TABLET | Freq: Every day | ORAL | 3 refills | Status: DC

## 2016-11-03 MED ORDER — ATORVASTATIN CALCIUM 80 MG PO TABS: 80.0000 mg | ORAL_TABLET | Freq: Every day | ORAL | 3 refills | Status: DC

## 2016-11-03 MED ORDER — METOPROLOL TARTRATE 25 MG PO TABS: 25.0000 mg | ORAL_TABLET | Freq: Two times a day (BID) | ORAL | 3 refills | Status: DC

## 2016-11-03 MED ORDER — LISINOPRIL 5 MG PO TABS: 5.0000 mg | ORAL_TABLET | Freq: Every day | ORAL | 3 refills | Status: DC

## 2016-11-03 MED ORDER — FERROUS SULFATE 325 (65 FE) MG PO TABS: 325.0000 mg | ORAL_TABLET | Freq: Every day | ORAL | 2 refills | Status: DC

## 2016-11-03 MED ORDER — FOLIC ACID 1 MG PO TABS: 1.0000 mg | ORAL_TABLET | Freq: Every day | ORAL | 3 refills | Status: DC

## 2016-11-03 MED ORDER — METFORMIN HCL 500 MG PO TABS: 500.0000 mg | ORAL_TABLET | Freq: Every day | ORAL | 3 refills | Status: DC

## 2016-11-03 MED ORDER — METFORMIN HCL 500 MG PO TABS: 500.0000 mg | ORAL_TABLET | Freq: Two times a day (BID) | ORAL | 3 refills | Status: DC

## 2016-11-03 MED ORDER — FUROSEMIDE 40 MG PO TABS: 40.0000 mg | ORAL_TABLET | Freq: Every day | ORAL | 3 refills | Status: DC

## 2016-11-03 MED ORDER — TRIAMCINOLONE ACETONIDE 0.5 % EX OINT: 1.0000 "application " | TOPICAL_OINTMENT | Freq: Two times a day (BID) | CUTANEOUS | 0 refills | Status: DC

## 2016-11-03 MED ORDER — ALLOPURINOL 300 MG PO TABS: 300.0000 mg | ORAL_TABLET | Freq: Every day | ORAL | 3 refills | Status: DC

## 2016-11-03 NOTE — Patient Instructions (Signed)
Diabetes Mellitus and Exercise Exercising regularly is important for your overall health, especially when you have diabetes (diabetes mellitus). Exercising is not only about losing weight. It has many health benefits, such as increasing muscle strength and bone density and reducing body fat and stress. This leads to improved fitness, flexibility, and endurance, all of which result in better overall health. Exercise has additional benefits for people with diabetes, including:  Reducing appetite.  Helping to lower and control blood glucose.  Lowering blood pressure.  Helping to control amounts of fatty substances (lipids) in the blood, such as cholesterol and triglycerides.  Helping the body to respond better to insulin (improving insulin sensitivity).  Reducing how much insulin the body needs.  Decreasing the risk for heart disease by:  Lowering cholesterol and triglyceride levels.  Increasing the levels of good cholesterol.  Lowering blood glucose levels. What is my activity plan? Your health care provider or certified diabetes educator can help you make a plan for the type and frequency of exercise (activity plan) that works for you. Make sure that you:  Do at least 150 minutes of moderate-intensity or vigorous-intensity exercise each week. This could be brisk walking, biking, or water aerobics.  Do stretching and strength exercises, such as yoga or weightlifting, at least 2 times a week.  Spread out your activity over at least 3 days of the week.  Get some form of physical activity every day.  Do not go more than 2 days in a row without some kind of physical activity.  Avoid being inactive for more than 90 minutes at a time. Take frequent breaks to walk or stretch.  Choose a type of exercise or activity that you enjoy, and set realistic goals.  Start slowly, and gradually increase the intensity of your exercise over time. What do I need to know about managing my  diabetes?  Check your blood glucose before and after exercising.  If your blood glucose is higher than 240 mg/dL (13.3 mmol/L) before you exercise, check your urine for ketones. If you have ketones in your urine, do not exercise until your blood glucose returns to normal.  Know the symptoms of low blood glucose (hypoglycemia) and how to treat it. Your risk for hypoglycemia increases during and after exercise. Common symptoms of hypoglycemia can include:  Hunger.  Anxiety.  Sweating and feeling clammy.  Confusion.  Dizziness or feeling light-headed.  Increased heart rate or palpitations.  Blurry vision.  Tingling or numbness around the mouth, lips, or tongue.  Tremors or shakes.  Irritability.  Keep a rapid-acting carbohydrate snack available before, during, and after exercise to help prevent or treat hypoglycemia.  Avoid injecting insulin into areas of the body that are going to be exercised. For example, avoid injecting insulin into:  The arms, when playing tennis.  The legs, when jogging.  Keep records of your exercise habits. Doing this can help you and your health care provider adjust your diabetes management plan as needed. Write down:  Food that you eat before and after you exercise.  Blood glucose levels before and after you exercise.  The type and amount of exercise you have done.  When your insulin is expected to peak, if you use insulin. Avoid exercising at times when your insulin is peaking.  When you start a new exercise or activity, work with your health care provider to make sure the activity is safe for you, and to adjust your insulin, medicines, or food intake as needed.  Drink plenty   of water while you exercise to prevent dehydration or heat stroke. Drink enough fluid to keep your urine clear or pale yellow. This information is not intended to replace advice given to you by your health care provider. Make sure you discuss any questions you have with  your health care provider. Document Released: 11/13/2003 Document Revised: 03/12/2016 Document Reviewed: 02/02/2016 Elsevier Interactive Patient Education  2017 [employer] Blood Glucose Monitoring, Adult Monitoring your blood sugar (glucose) helps you manage your diabetes. It also helps you and your health care provider determine how well your diabetes management plan is working. Blood glucose monitoring involves checking your blood glucose as often as directed, and keeping a record (log) of your results over time. Why should I monitor my blood glucose? Checking your blood glucose regularly can:  Help you understand how food, exercise, illnesses, and medicines affect your blood glucose.  Let you know what your blood glucose is at any time. You can quickly tell if you are having low blood glucose (hypoglycemia) or high blood glucose (hyperglycemia).  Help you and your health care provider adjust your medicines as needed. When should I check my blood glucose? Follow instructions from your health care provider about how often to check your blood glucose. This may depend on:  The type of diabetes you have.  How well-controlled your diabetes is.  Medicines you are taking. If you have type 1 diabetes:   Check your blood glucose at least 2 times a day.  Also check your blood glucose:  Before every insulin injection.  Before and after exercise.  Between meals.  2 hours after a meal.  Occasionally between 2:00 a.m. and 3:00 a.m., as directed.  Before potentially dangerous tasks, like driving or using heavy machinery.  At bedtime.  You may need to check your blood glucose more often, up to 6-10 times a day:  If you use an insulin pump.  If you need multiple daily injections (MDI).  If your diabetes is not well-controlled.  If you are ill.  If you have a history of severe hypoglycemia.  If you have a history of not knowing when your blood glucose is getting low  (hypoglycemia unawareness). If you have type 2 diabetes:   If you take insulin or other diabetes medicines, check your blood glucose at least 2 times a day.  If you are on intensive insulin therapy, check your blood glucose at least 4 times a day. Occasionally, you may also need to check between 2:00 a.m. and 3:00 a.m., as directed.  Also check your blood glucose:  Before and after exercise.  Before potentially dangerous tasks, like driving or using heavy machinery.  You may need to check your blood glucose more often if:  Your medicine is being adjusted.  Your diabetes is not well-controlled.  You are ill. What is a blood glucose log?  A blood glucose log is a record of your blood glucose readings. It helps you and your health care provider:  Look for patterns in your blood glucose over time.  Adjust your diabetes management plan as needed.  Every time you check your blood glucose, write down your result and notes about things that may be affecting your blood glucose, such as your diet and exercise for the day.  Most glucose meters store a record of glucose readings in the meter. Some meters allow you to download your records to a computer. How do I check my blood glucose? Follow these steps to get accurate readings of   your blood glucose: Supplies needed    Blood glucose meter.  Test strips for your meter. Each meter has its own strips. You must use the strips that come with your meter.  A needle to prick your finger (lancet). Do not use lancets more than once.  A device that holds the lancet (lancing device).  A journal or log book to write down your results. Procedure   Wash your hands with soap and water.  Prick the side of your finger (not the tip) with the lancet. Use a different finger each time.  Gently rub the finger until a small drop of blood appears.  Follow instructions that come with your meter for inserting the test strip, applying blood to the  strip, and using your blood glucose meter.  Write down your result and any notes. Alternative testing sites   Some meters allow you to use areas of your body other than your finger (alternative sites) to test your blood.  If you think you may have hypoglycemia, or if you have hypoglycemia unawareness, do not use alternative sites. Use your finger instead.  Alternative sites may not be as accurate as the fingers, because blood flow is slower in these areas. This means that the result you get may be delayed, and it may be different from the result that you would get from your finger.  The most common alternative sites are:  Forearm.  Thigh.  Palm of the hand. Additional tips   Always keep your supplies with you.  If you have questions or need help, all blood glucose meters have a 24-hour "hotline" number that you can call. You may also contact your health care provider.  After you use a few boxes of test strips, adjust (calibrate) your blood glucose meter by following instructions that came with your meter. This information is not intended to replace advice given to you by your health care provider. Make sure you discuss any questions you have with your health care provider. Document Released: 08/26/2003 Document Revised: 03/12/2016 Document Reviewed: 02/02/2016 Elsevier Interactive Patient Education  2017 [employer] Inc.  

## 2016-11-03 NOTE — Progress Notes (Signed)
Patient is here for HTN FU  Patient denies pain at this time.  Patient has taken medication today. Patient has eaten today.  Patient tolerated the flu vaccine well today.
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
## 2016-11-04 ENCOUNTER — Encounter: Payer: Self-pay | Admitting: Internal Medicine

## 2016-11-18 ENCOUNTER — Ambulatory Visit (HOSPITAL_COMMUNITY): Payer: PPO

## 2016-11-30 ENCOUNTER — Ambulatory Visit (HOSPITAL_COMMUNITY): Payer: PPO

## 2016-12-02 ENCOUNTER — Encounter: Payer: Self-pay | Admitting: *Deleted

## 2016-12-02 NOTE — Progress Notes (Signed)
Patients ECHO was approved by [facility] [id number] effective 11/16/16-02/14/17

## 2016-12-06 ENCOUNTER — Other Ambulatory Visit (HOSPITAL_COMMUNITY): Payer: PPO

## 2016-12-08 ENCOUNTER — Ambulatory Visit (HOSPITAL_COMMUNITY)
Admission: RE | Admit: 2016-12-08 | Discharge: 2016-12-08 | Disposition: A | Discharge status: Home / Self Care | Payer: PPO | Source: Ambulatory Visit | Attending: Internal Medicine | Admitting: Internal Medicine

## 2016-12-08 DIAGNOSIS — I517 Cardiomegaly: Secondary | ICD-10-CM | POA: Diagnosis not present

## 2016-12-08 DIAGNOSIS — I10 Essential (primary) hypertension: Secondary | ICD-10-CM | POA: Insufficient documentation

## 2016-12-08 DIAGNOSIS — I340 Nonrheumatic mitral (valve) insufficiency: Secondary | ICD-10-CM | POA: Diagnosis not present

## 2016-12-08 MED ORDER — PERFLUTREN LIPID MICROSPHERE
1.0000 mL | INTRAVENOUS | Status: AC | PRN
  Administered 2016-12-08: 2 mL via INTRAVENOUS
  Filled 2016-12-08: qty 10

## 2016-12-08 NOTE — Progress Notes (Signed)
  Echocardiogram 2D Echocardiogram has been performed.  Tye Savoy 12/08/2016, 4:58 PM

## 2016-12-10 ENCOUNTER — Telehealth: Payer: Self-pay | Admitting: *Deleted

## 2016-12-10 NOTE — Telephone Encounter (Signed)
Patient verified DOB Patient is aware of ECHO showing normal heart pumping activity. Patient expressed his understanding and had no further questions at this time.

## 2016-12-10 NOTE — Telephone Encounter (Signed)
-----   Message from Quentin Angst, MD sent at 12/10/2016  3:43 PM EDT ----- Please inform the patient that his heart ultrasound showed normal pumping activities.

## 2016-12-27 ENCOUNTER — Ambulatory Visit: Payer: PPO

## 2016-12-27 VITALS — Ht 66.0 in | Wt 229.6 lb

## 2016-12-27 DIAGNOSIS — Z1211 Encounter for screening for malignant neoplasm of colon: Secondary | ICD-10-CM

## 2016-12-27 MED ORDER — NA SULFATE-K SULFATE-MG SULF 17.5-3.13-1.6 GM/177ML PO SOLN: 1.0000 | Freq: Once | ORAL | 0 refills | Status: AC

## 2016-12-27 NOTE — Progress Notes (Signed)
Denies allergies to eggs or soy products. Denies complication of anesthesia or sedation. Denies use of weight loss medication. Denies use of O2.   Emmi instructions declined. Patient does not have a computer. 

## 2016-12-28 ENCOUNTER — Encounter: Payer: Self-pay | Admitting: Gastroenterology

## 2017-01-03 ENCOUNTER — Encounter: Payer: Self-pay | Admitting: Gastroenterology

## 2017-01-06 ENCOUNTER — Telehealth: Payer: Self-pay

## 2017-01-11 ENCOUNTER — Encounter: Payer: PPO | Admitting: Gastroenterology

## 2017-01-13 ENCOUNTER — Ambulatory Visit (INDEPENDENT_AMBULATORY_CARE_PROVIDER_SITE_OTHER): Payer: PPO

## 2017-01-13 ENCOUNTER — Ambulatory Visit (INDEPENDENT_AMBULATORY_CARE_PROVIDER_SITE_OTHER): Payer: PPO | Admitting: Podiatry

## 2017-01-13 DIAGNOSIS — M79675 Pain in left toe(s): Secondary | ICD-10-CM

## 2017-01-13 DIAGNOSIS — M79671 Pain in right foot: Secondary | ICD-10-CM

## 2017-01-13 DIAGNOSIS — M722 Plantar fascial fibromatosis: Secondary | ICD-10-CM | POA: Diagnosis not present

## 2017-01-13 DIAGNOSIS — M79674 Pain in right toe(s): Secondary | ICD-10-CM

## 2017-01-13 DIAGNOSIS — B351 Tinea unguium: Secondary | ICD-10-CM

## 2017-01-14 IMAGING — CR DG CHEST 2V
2 series · 2 of 2 positions shown · non-contrast
Comparison: 11/15/2015 Normal heart size and pulmonary vascularity.

CLINICAL DATA: Preoperative for CABG. Chest pain and shortness of
breath on [REDACTED].

EXAM:
CHEST  2 VIEW

[chest pa]
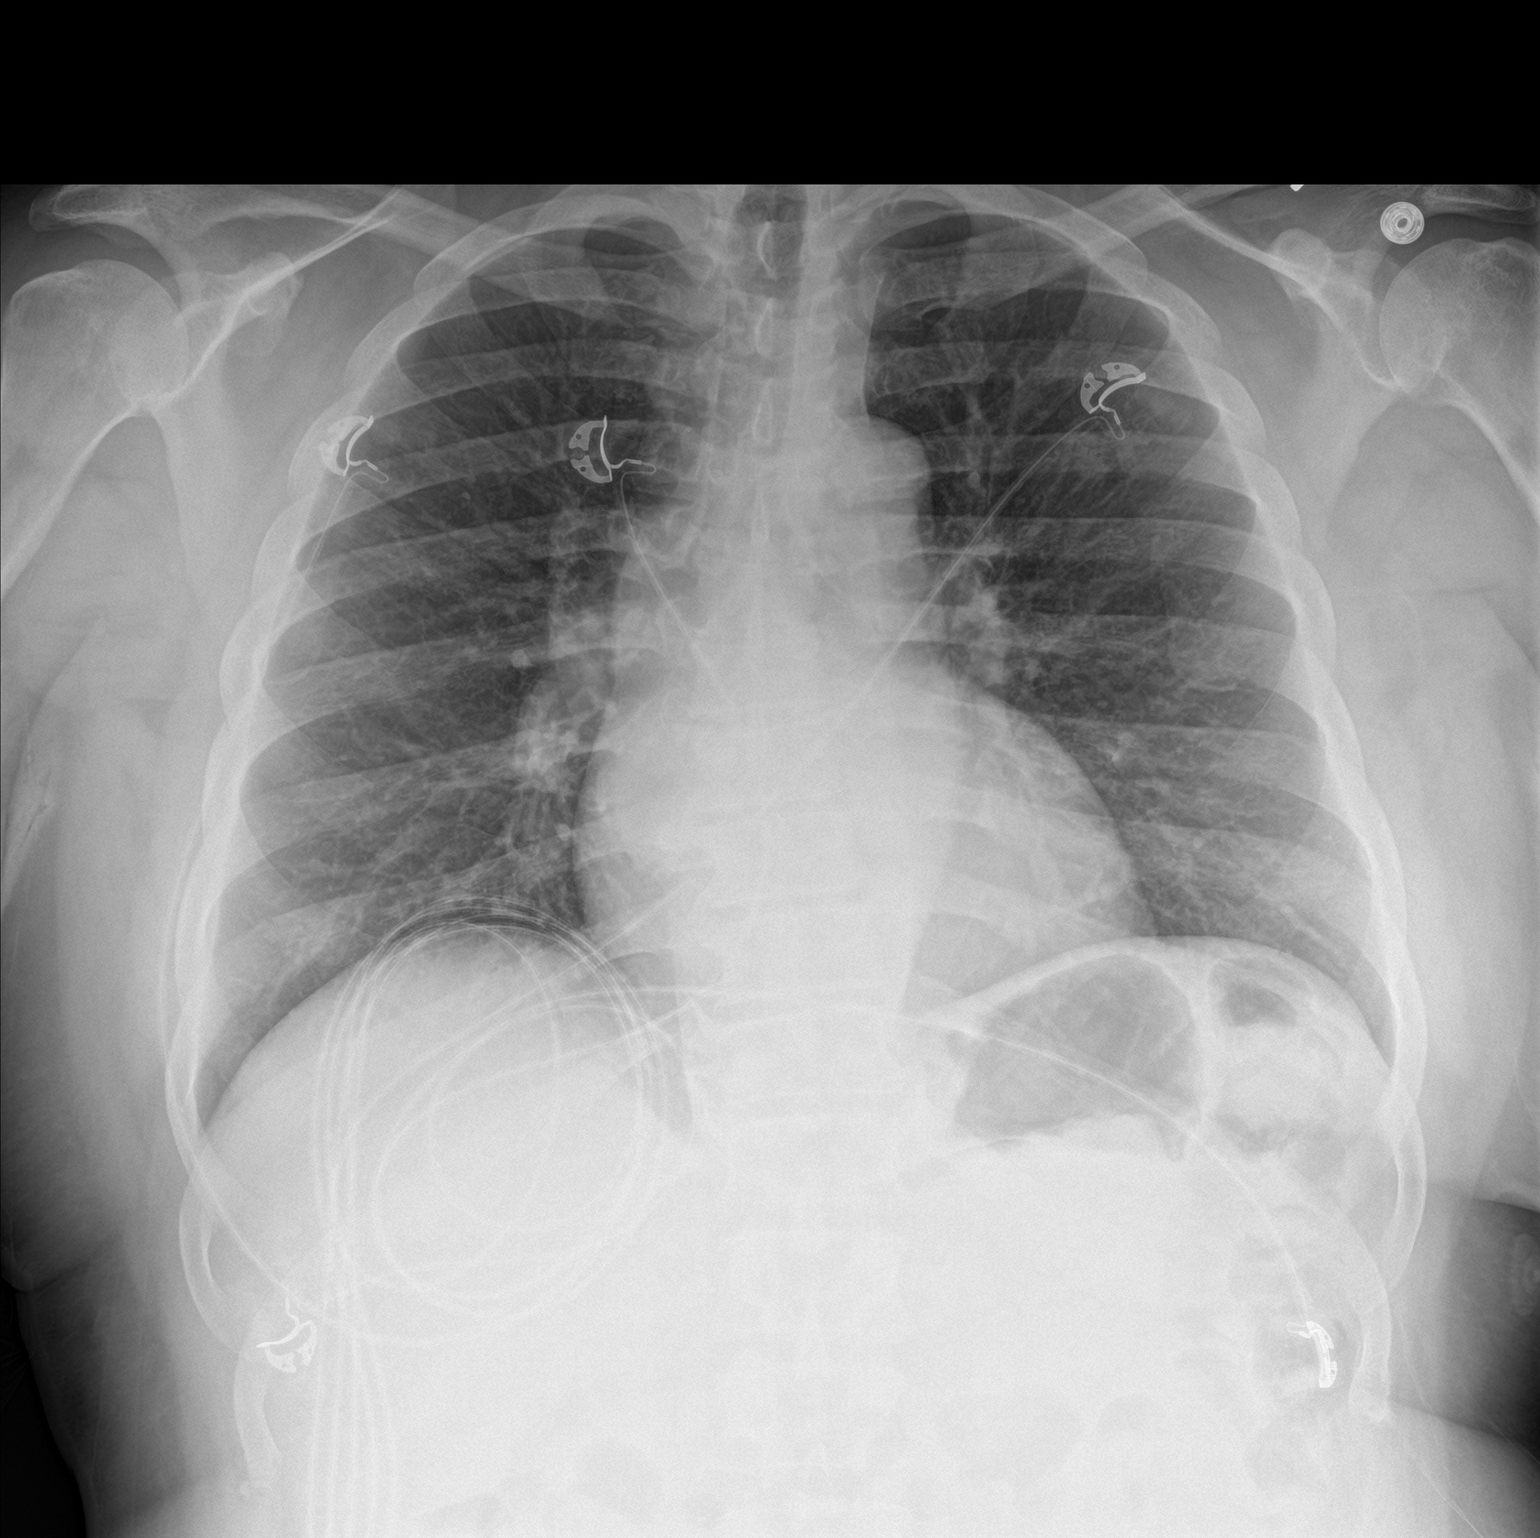

[chest lat]
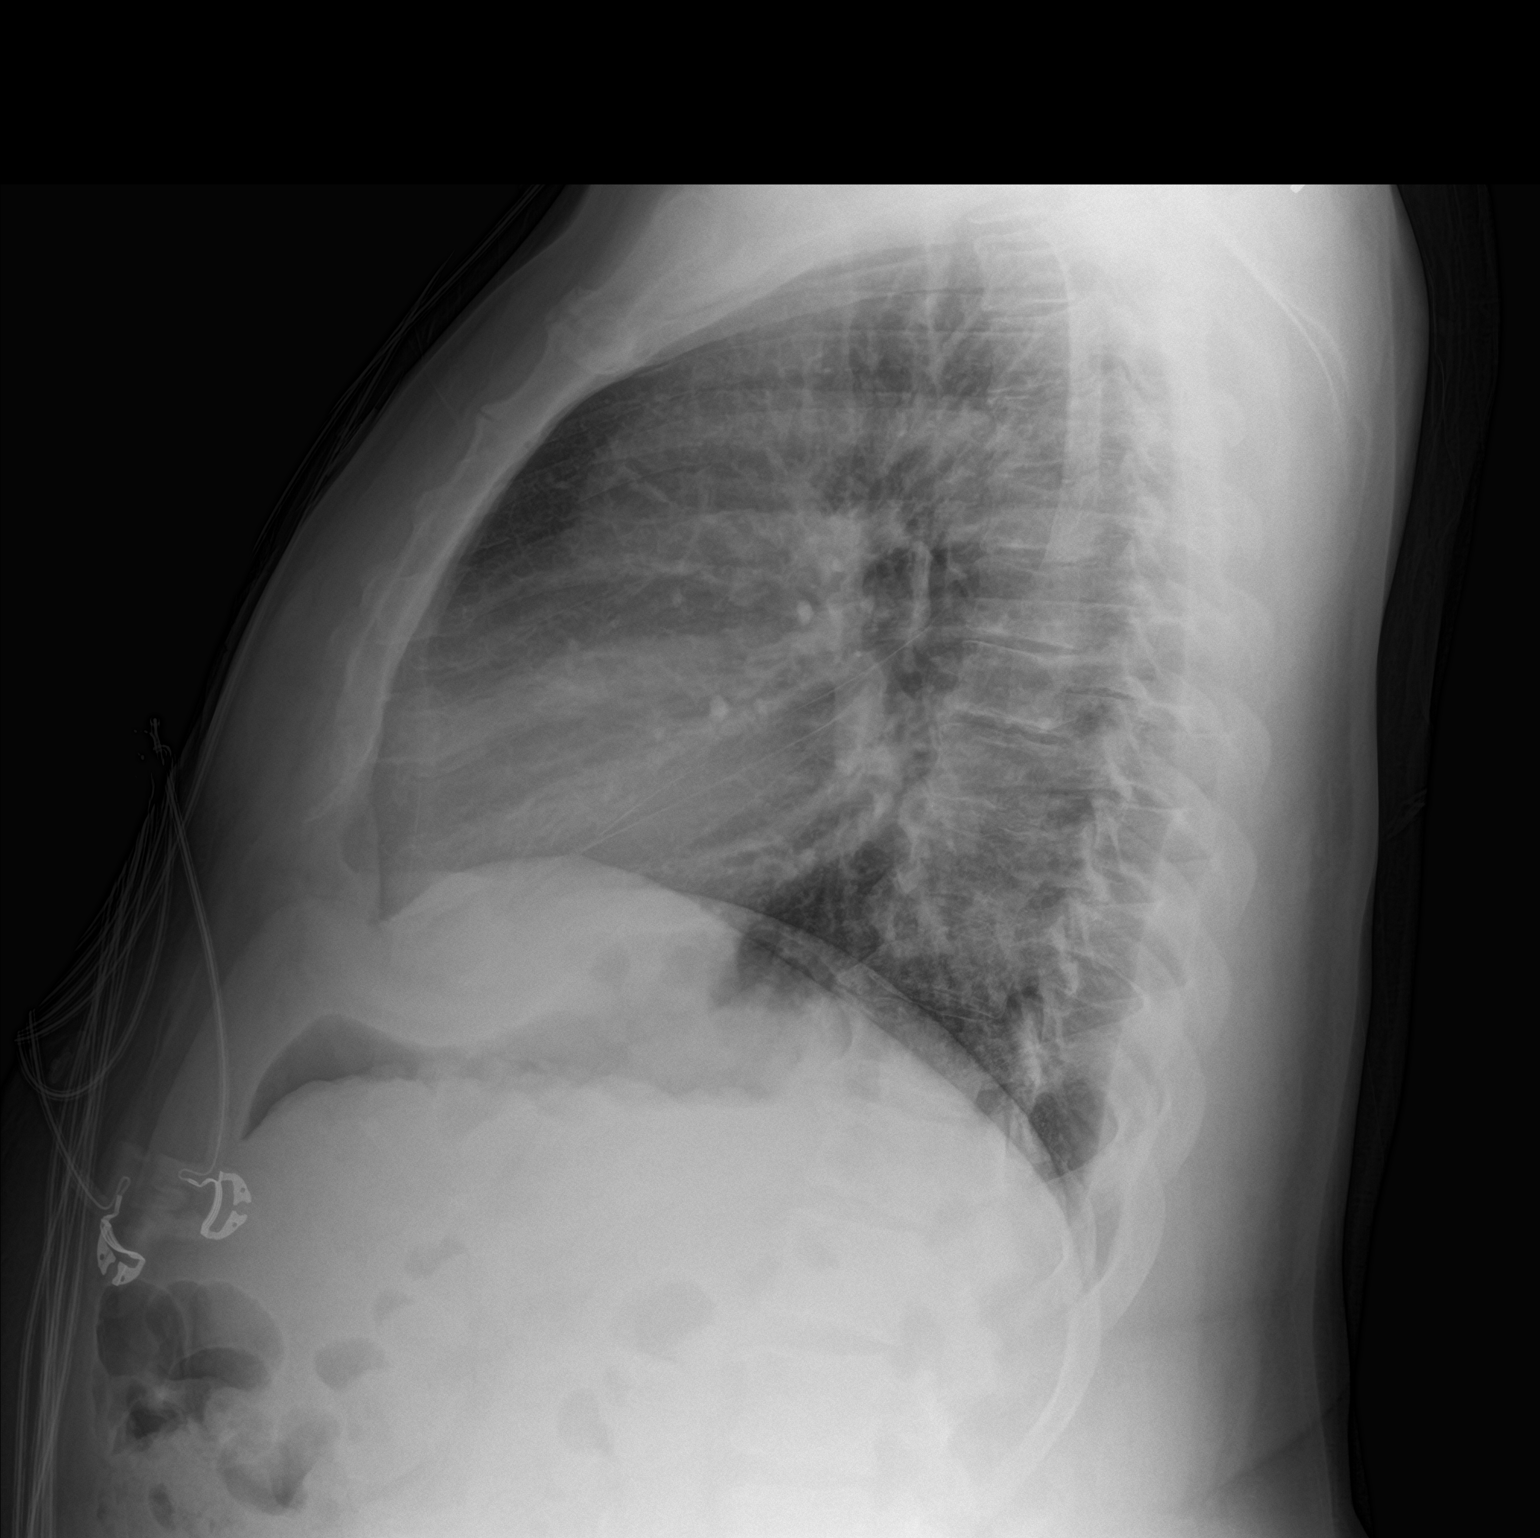

[2 of 2 positions shown; findings below may reference images not displayed]

No focal airspace disease or consolidation in the lungs. No blunting
of costophrenic angles. No pneumothorax. Mediastinal contours appear
intact. Degenerative changes in the spine.
FINDINGS: The heart size and mediastinal contours are within normal limits.
Both lungs are clear. The visualized skeletal structures are
unremarkable.
IMPRESSION: No active cardiopulmonary disease.

## 2017-01-17 IMAGING — CR DG CHEST 1V PORT
1 series · 1 of 1 positions shown · non-contrast
Comparison: 11/26/2015 .

CLINICAL DATA: Aortic valve replacement.

EXAM:
PORTABLE CHEST 1 VIEW

[AP]
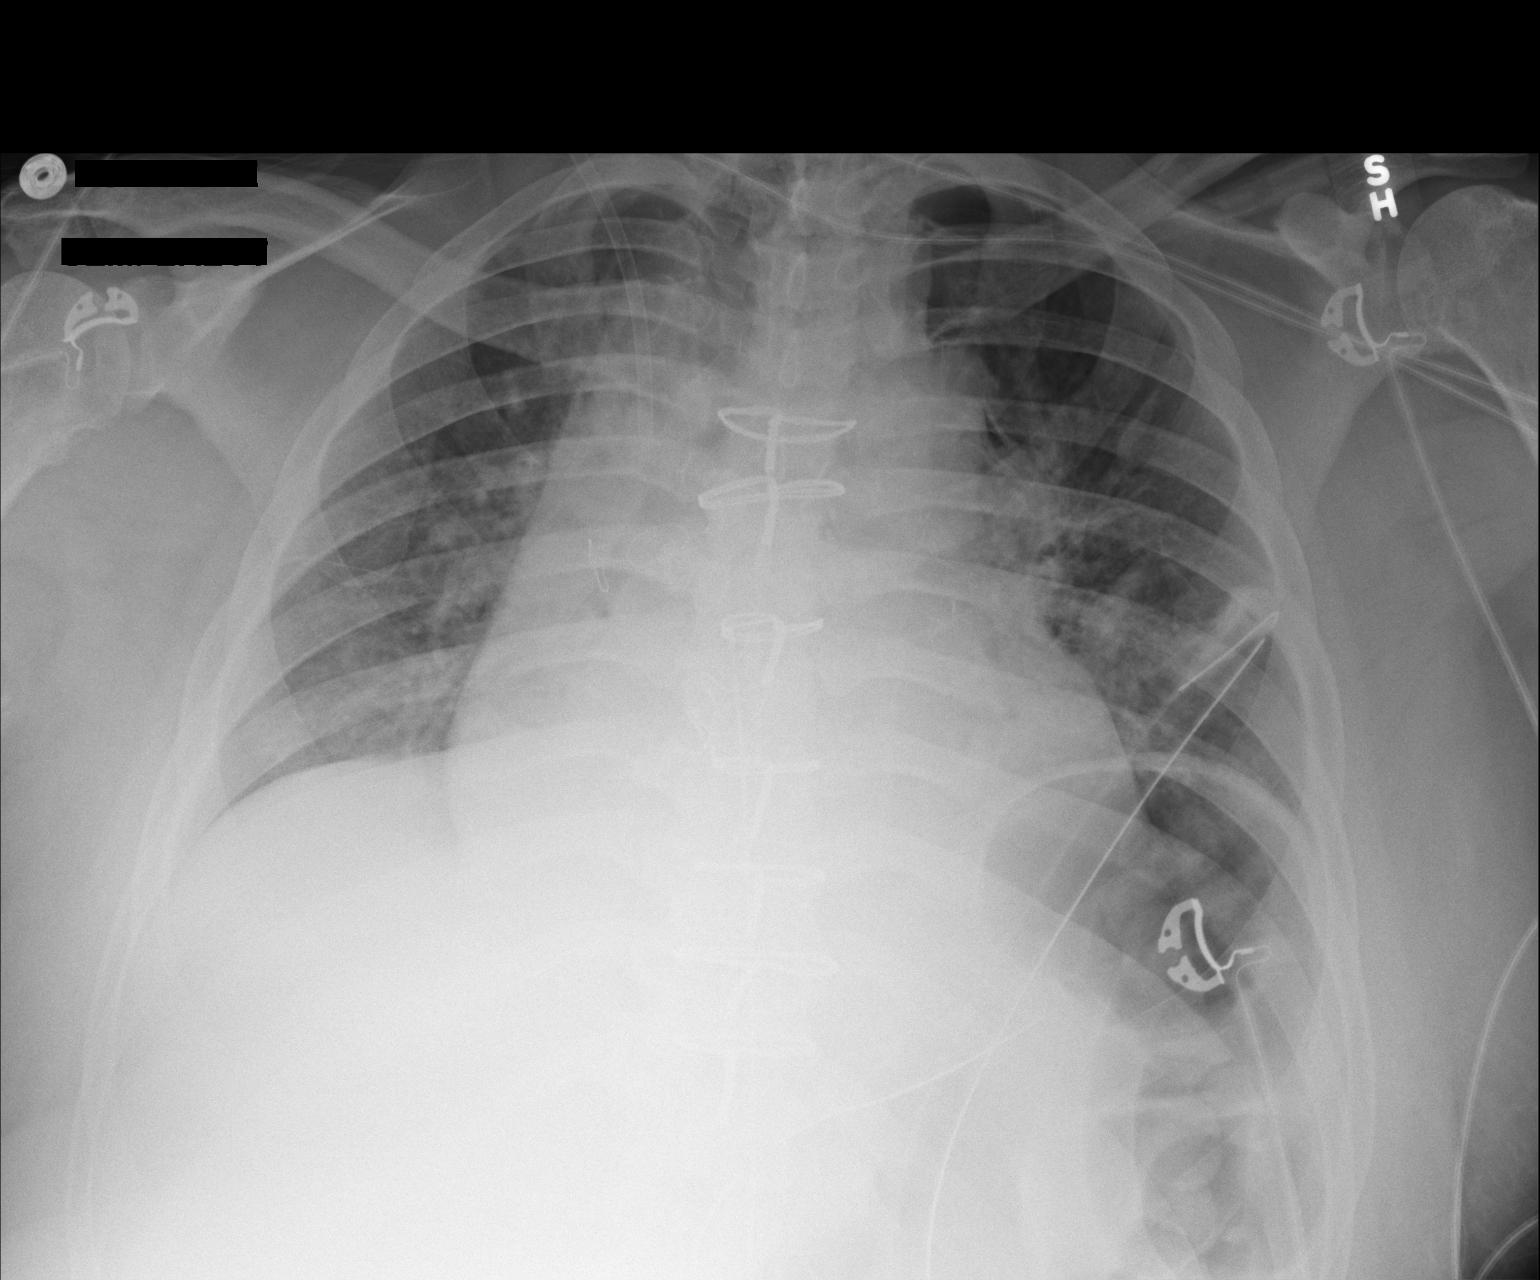

[1 of 1 positions shown; findings below may reference images not displayed]

FINDINGS: New interim removal Swan-Ganz catheter. Right IJ sheath and left
chest tube in stable position. Small left apical pneumothorax again
noted. Prior aortic valve replacement. Stable cardiomegaly. Low lung
volumes with basilar atelectasis and/or infiltrates.
IMPRESSION: 1. Interim removal Swan-Ganz catheter. Right IJ sheath and left
chest tube in stable position. Tiny left apical pneumothorax again
noted.
2. Prior aortic valve replacement.  Stable cardiomegaly.
3. Lung volumes with mild bibasilar atelectasis and/or infiltrates.

## 2017-01-19 NOTE — Telephone Encounter (Signed)
error 

## 2017-01-20 NOTE — Progress Notes (Signed)
Subjective: 56 y.o. returns the office today for painful, elongated, thickened toenails which he cannot trim himself. Denies any redness or drainage around the nails. While at his appointment today he has some secondary concerns. He has is his been having some pain to his heels after sitting down and standing backup. He denies any recent injury or trauma to the area. No swelling or redness. No injury. Denies any acute changes since last appointment and no new complaints today. Denies any systemic complaints such as fevers, chills, nausea, vomiting.   Objective: AAO 3, NAD DP/PT pulses palpable, CRT less than 3 seconds Nails hypertrophic, dystrophic, elongated, brittle, discolored 10. There is tenderness overlying the nails 1-5 bilaterally. There is no surrounding erythema or drainage along the nail sites. No open lesions or pre-ulcerative lesions are identified. There is mild enderness to palpation along the plantar medial tubercle of the calcaneus at the insertion of plantar fascia on the left and right foot. There is no pain along the course of the plantar fascia within the arch of the foot. Plantar fascia appears to be intact. There is no pain with lateral compression of the calcaneus or pain with vibratory sensation. There is no pain along the course or insertion of the achilles tendon. No other areas of tenderness to bilateral lower extremities. No other areas of tenderness bilateral lower extremities. No overlying edema, erythema, increased warmth. No pain with calf compression, swelling, warmth, erythema.  Assessment: Patient presents with symptomatic onychomycosis; plantar fasciitis  Plan: -Treatment options including alternatives, risks, complications were discussed -Nails sharply debrided 10 without complication/bleeding. -Discussed stretching, icing daily as well supportive shoe gear and also answers. Discussed steroid injections today. If symptoms do not improve next 3 weeks in  follow-up. -Discussed daily foot inspection. If there are any changes, to call the office immediately.  -Follow-up in 3 months or sooner if any problems are to arise. In the meantime, encouraged to call the office with any questions, concerns, changes symptoms.  Ovid Curd, DPM

## 2017-01-21 IMAGING — CR DG CHEST 2V
2 series · 2 of 2 positions shown · non-contrast
Comparison: 11/28/2015

CLINICAL DATA: Followup cardiac surgery in pneumothorax.

EXAM:
CHEST  2 VIEW

[chest pa]
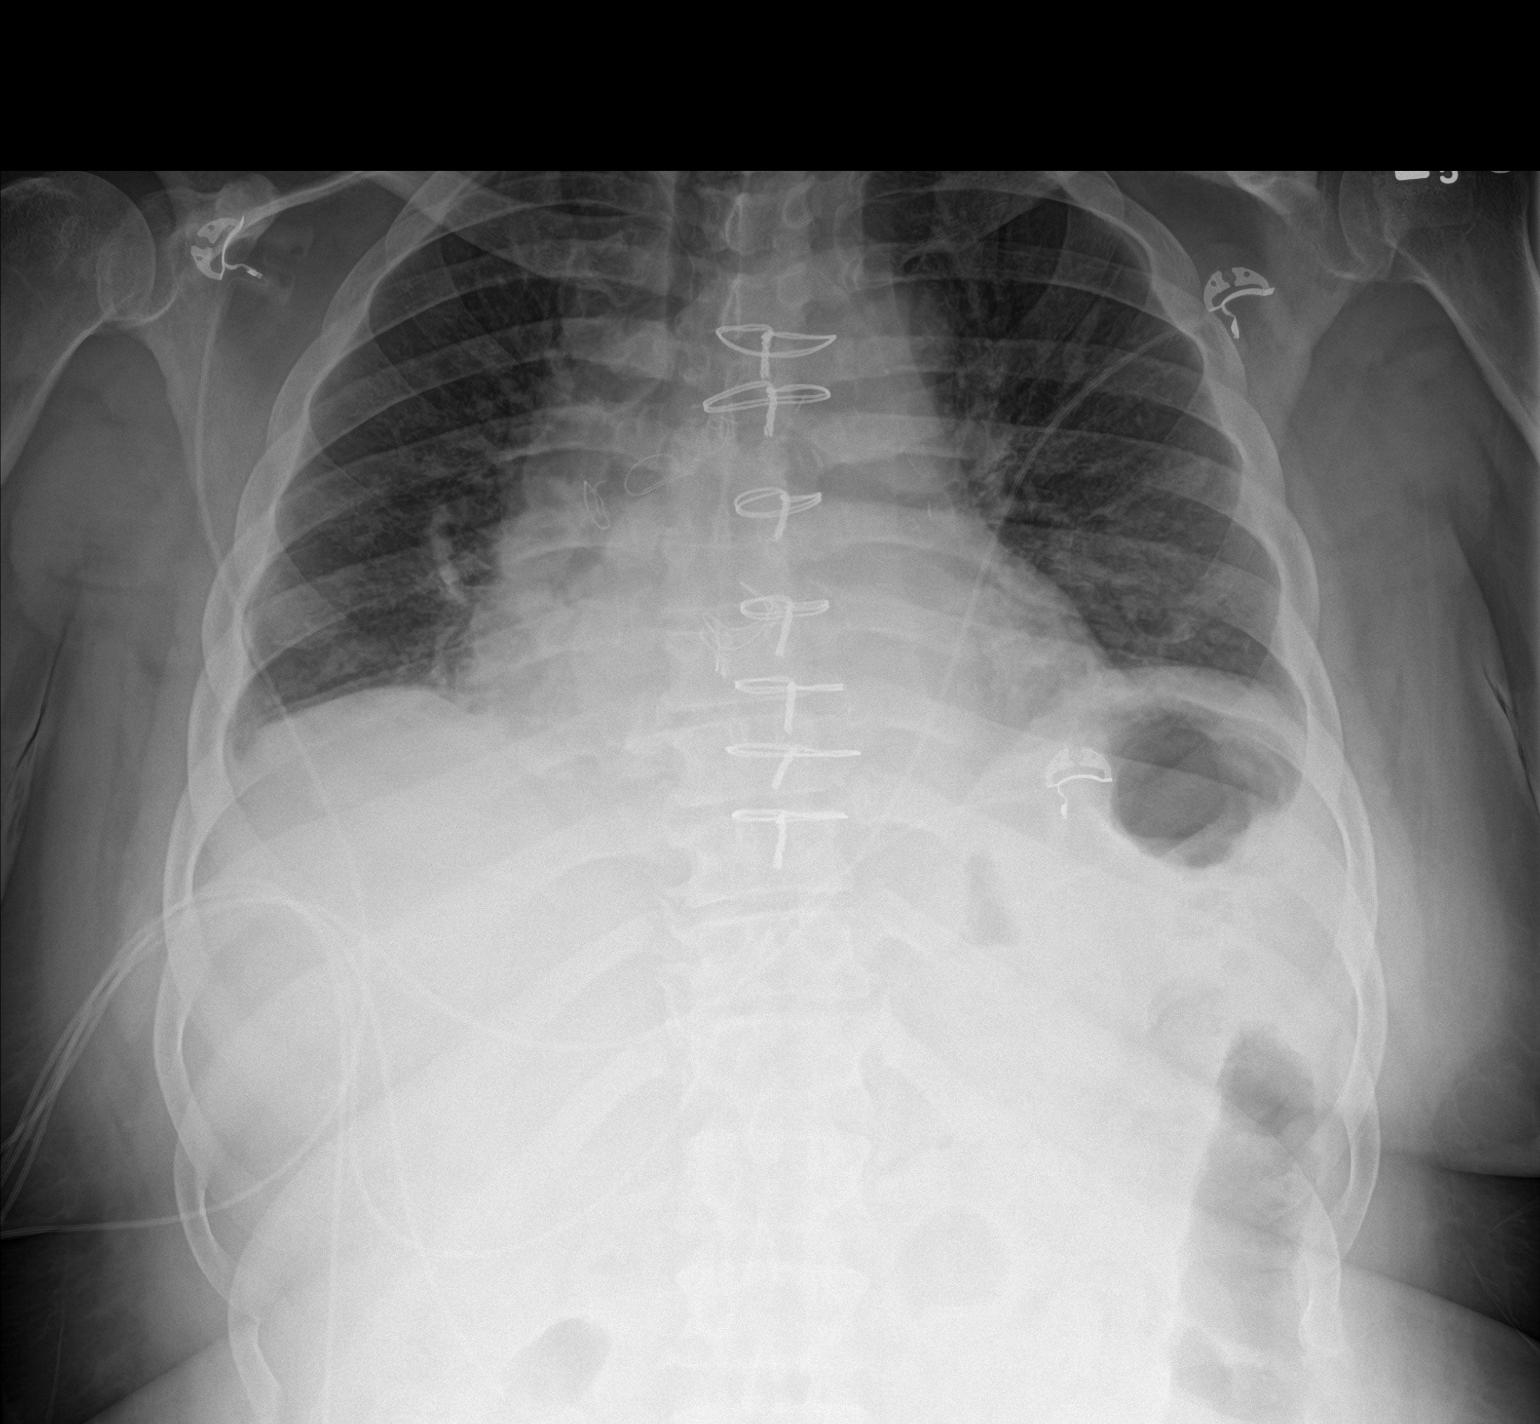

[chest lat]
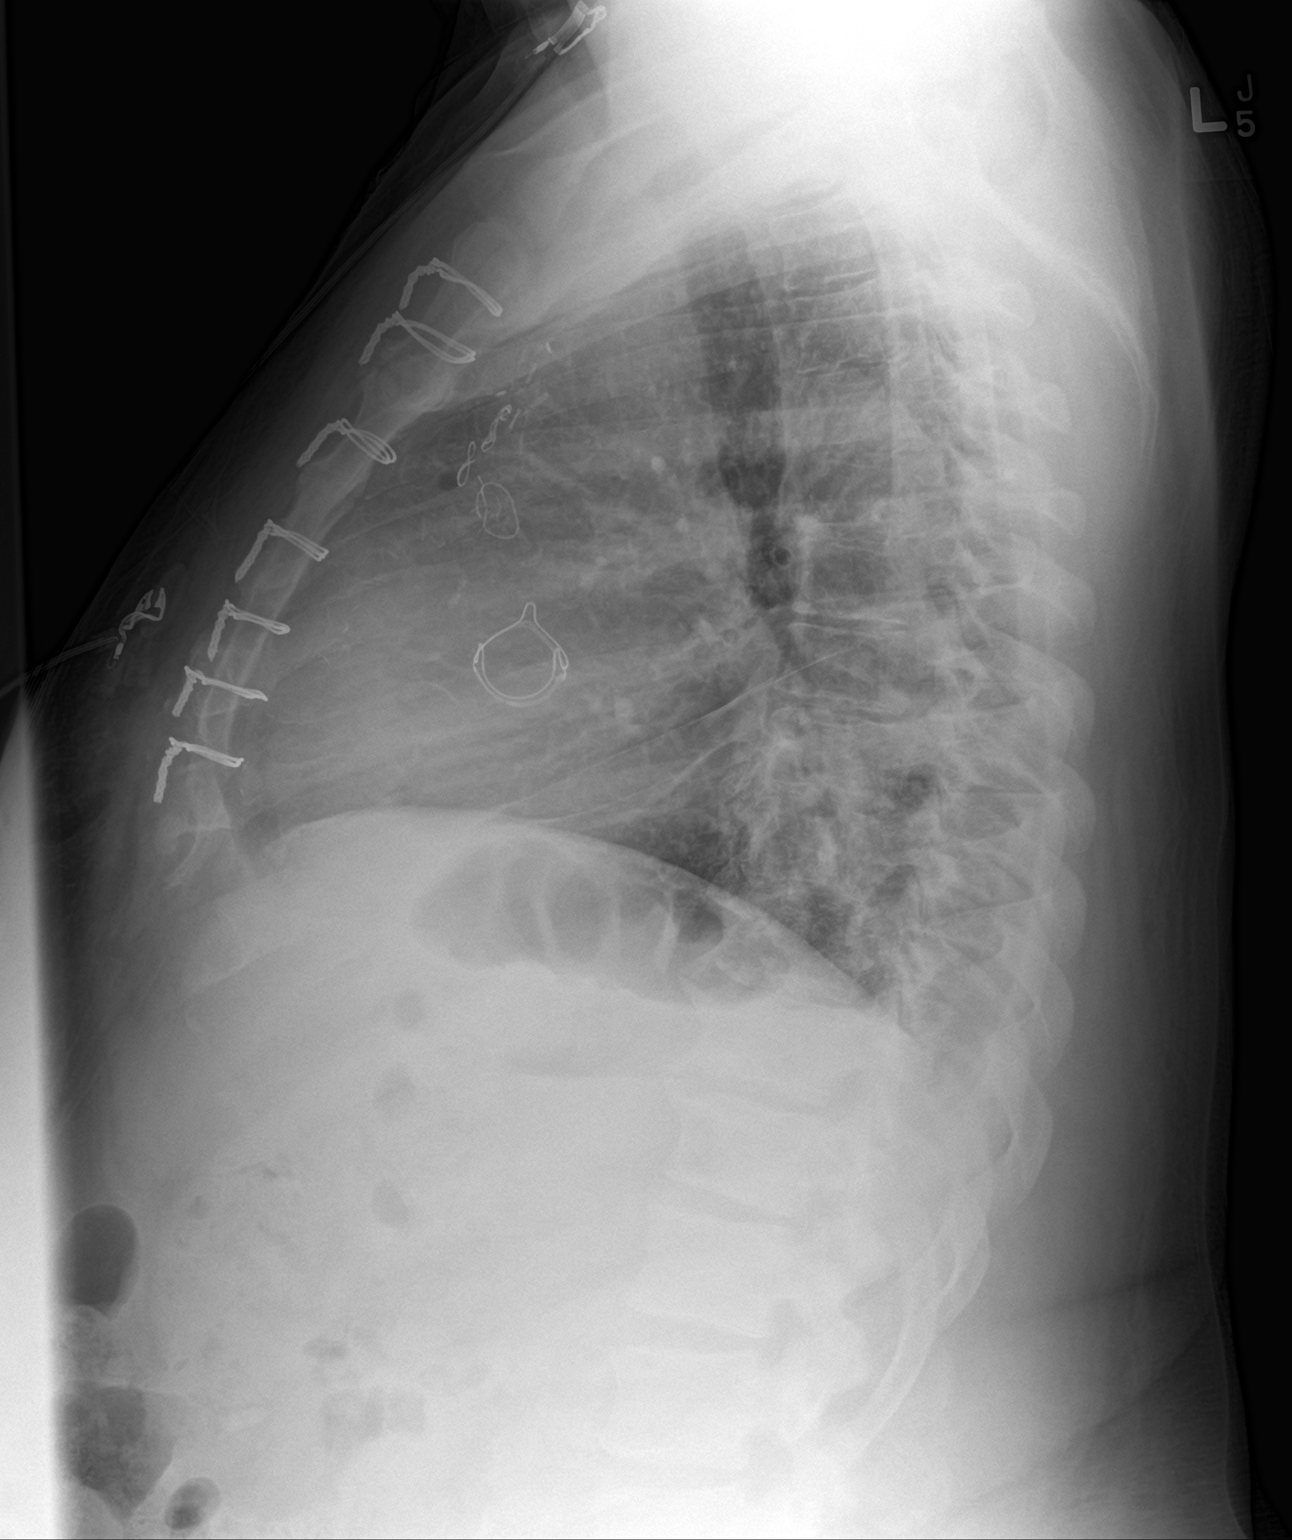

[2 of 2 positions shown; findings below may reference images not displayed]

FINDINGS: Venous access sheath is been removed. Previous median sternotomy,
CABG and aortic valve replacement. No pneumothorax. No pleural
fluid. Lungs well aerated. Persistent cardiomegaly and perhaps mild
venous hypertension.
IMPRESSION: Good postoperative appearance. No pneumothorax, collapse or
effusion.

## 2017-02-03 ENCOUNTER — Ambulatory Visit: Payer: PPO | Admitting: Podiatry

## 2017-03-20 ENCOUNTER — Emergency Department (HOSPITAL_COMMUNITY): Payer: PPO

## 2017-03-20 ENCOUNTER — Emergency Department (HOSPITAL_COMMUNITY)
Admission: EM | Admit: 2017-03-20 | Discharge: 2017-03-20 | Disposition: A | Discharge status: Home / Self Care | Payer: PPO | Attending: Emergency Medicine | Admitting: Emergency Medicine

## 2017-03-20 ENCOUNTER — Encounter (HOSPITAL_COMMUNITY): Payer: Self-pay

## 2017-03-20 DIAGNOSIS — Z7982 Long term (current) use of aspirin: Secondary | ICD-10-CM | POA: Insufficient documentation

## 2017-03-20 DIAGNOSIS — I119 Hypertensive heart disease without heart failure: Secondary | ICD-10-CM | POA: Diagnosis not present

## 2017-03-20 DIAGNOSIS — Y999 Unspecified external cause status: Secondary | ICD-10-CM | POA: Diagnosis not present

## 2017-03-20 DIAGNOSIS — I252 Old myocardial infarction: Secondary | ICD-10-CM | POA: Insufficient documentation

## 2017-03-20 DIAGNOSIS — E119 Type 2 diabetes mellitus without complications: Secondary | ICD-10-CM | POA: Diagnosis not present

## 2017-03-20 DIAGNOSIS — R55 Syncope and collapse: Secondary | ICD-10-CM | POA: Insufficient documentation

## 2017-03-20 DIAGNOSIS — Y929 Unspecified place or not applicable: Secondary | ICD-10-CM | POA: Insufficient documentation

## 2017-03-20 DIAGNOSIS — Y93I9 Activity, other involving external motion: Secondary | ICD-10-CM | POA: Insufficient documentation

## 2017-03-20 DIAGNOSIS — I2510 Atherosclerotic heart disease of native coronary artery without angina pectoris: Secondary | ICD-10-CM | POA: Insufficient documentation

## 2017-03-20 DIAGNOSIS — Z7901 Long term (current) use of anticoagulants: Secondary | ICD-10-CM | POA: Diagnosis not present

## 2017-03-20 DIAGNOSIS — Z79899 Other long term (current) drug therapy: Secondary | ICD-10-CM | POA: Insufficient documentation

## 2017-03-20 DIAGNOSIS — Z7984 Long term (current) use of oral hypoglycemic drugs: Secondary | ICD-10-CM | POA: Insufficient documentation

## 2017-03-20 DIAGNOSIS — Z95810 Presence of automatic (implantable) cardiac defibrillator: Secondary | ICD-10-CM | POA: Diagnosis not present

## 2017-03-20 DIAGNOSIS — R0789 Other chest pain: Secondary | ICD-10-CM | POA: Diagnosis not present

## 2017-03-20 DIAGNOSIS — R079 Chest pain, unspecified: Secondary | ICD-10-CM | POA: Insufficient documentation

## 2017-03-20 DIAGNOSIS — Z8673 Personal history of transient ischemic attack (TIA), and cerebral infarction without residual deficits: Secondary | ICD-10-CM | POA: Insufficient documentation

## 2017-03-20 DIAGNOSIS — K8020 Calculus of gallbladder without cholecystitis without obstruction: Secondary | ICD-10-CM | POA: Diagnosis not present

## 2017-03-20 LAB — BASIC METABOLIC PANEL
ANION GAP: 11 (ref 5–15)
BUN: 18 mg/dL (ref 6–20)
CHLORIDE: 111 mmol/L (ref 101–111)
CO2: 18 mmol/L — AB (ref 22–32)
Calcium: 9.5 mg/dL (ref 8.9–10.3)
Creatinine, Ser: 1.26 mg/dL — ABNORMAL HIGH (ref 0.61–1.24)
GFR calc non Af Amer: >60 mL/min (ref >60)
GLUCOSE: 129 mg/dL — AB (ref 65–99)
Potassium: 4.6 mmol/L (ref 3.5–5.1)
Sodium: 140 mmol/L (ref 135–145)

## 2017-03-20 LAB — CBC
HEMATOCRIT: 30.8 % — AB (ref 39.0–52.0)
HEMOGLOBIN: 10.4 g/dL — AB (ref 13.0–17.0)
MCH: 31.3 pg (ref 26.0–34.0)
MCHC: 33.8 g/dL (ref 30.0–36.0)
MCV: 92.8 fL (ref 78.0–100.0)
Platelets: 357 10*3/uL (ref 150–400)
RBC: 3.32 MIL/uL — AB (ref 4.22–5.81)
RDW: 15.6 % — ABNORMAL HIGH (ref 11.5–15.5)
WBC: 10.8 10*3/uL — ABNORMAL HIGH (ref 4.0–10.5)

## 2017-03-20 LAB — PROTIME-INR
INR: 1.05
Prothrombin Time: 13.8 seconds (ref 11.4–15.2)

## 2017-03-20 LAB — I-STAT TROPONIN, ED: TROPONIN I, POC: 0.03 ng/mL (ref 0.00–0.08)

## 2017-03-20 MED ORDER — IBUPROFEN 600 MG PO TABS: 600.0000 mg | ORAL_TABLET | Freq: Three times a day (TID) | ORAL | 0 refills | Status: DC | PRN

## 2017-03-20 MED ORDER — IOPAMIDOL (ISOVUE-300) INJECTION 61%
INTRAVENOUS | Status: AC
  Administered 2017-03-20: 100 mL
  Filled 2017-03-20: qty 100

## 2017-03-20 MED ORDER — MORPHINE SULFATE (PF) 4 MG/ML IV SOLN
4.0000 mg | Freq: Once | INTRAVENOUS | Status: AC
  Administered 2017-03-20: 4 mg via INTRAVENOUS
  Filled 2017-03-20: qty 1

## 2017-03-20 MED ORDER — CYCLOBENZAPRINE HCL 10 MG PO TABS: 10.0000 mg | ORAL_TABLET | Freq: Three times a day (TID) | ORAL | 0 refills | Status: DC | PRN

## 2017-03-20 NOTE — ED Notes (Signed)
ED Provider at bedside. 
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
## 2017-03-20 NOTE — ED Notes (Signed)
Patient transported to CT 
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
## 2017-04-14 ENCOUNTER — Ambulatory Visit: Payer: PPO | Admitting: Podiatry

## 2017-04-25 ENCOUNTER — Encounter: Payer: Self-pay | Admitting: Podiatry

## 2017-04-25 ENCOUNTER — Ambulatory Visit (INDEPENDENT_AMBULATORY_CARE_PROVIDER_SITE_OTHER): Payer: PPO | Admitting: Podiatry

## 2017-04-25 DIAGNOSIS — M79674 Pain in right toe(s): Secondary | ICD-10-CM | POA: Diagnosis not present

## 2017-04-25 DIAGNOSIS — B351 Tinea unguium: Secondary | ICD-10-CM | POA: Diagnosis not present

## 2017-04-25 DIAGNOSIS — M79675 Pain in left toe(s): Secondary | ICD-10-CM

## 2017-04-25 DIAGNOSIS — L988 Other specified disorders of the skin and subcutaneous tissue: Secondary | ICD-10-CM

## 2017-04-25 MED ORDER — CASTELLANI PAINT 1.5 % EX LIQD: 1.0000 "application " | Freq: Every day | CUTANEOUS | 1 refills | Status: DC

## 2017-04-27 ENCOUNTER — Telehealth: Payer: Self-pay | Admitting: Podiatry

## 2017-04-27 NOTE — Telephone Encounter (Signed)
I was there on Monday and the doctor was supposed to send my medication to my pharmacy. They still have not called me to let me know they have it or that it is ready. I looked in the pt's chart and told him it was e-scribed and that his pharmacy showed they received it on Monday 20 August at 10:02 am. He stated they have not contacted him to which I replied to the pt he should call them. Pt stated he would give his pharmacy a call.

## 2017-04-27 NOTE — Progress Notes (Signed)
Subjective: 56 y.o. returns the office today for painful, elongated, thickened toenails which he cannot trim himself. Denies any redness or drainage around the nails. He also states that between the toes is staying moist. He has tried numerous OTC products without any improvement. Denies any open sores, drainage, swelling, pus. Denies any acute changes since last appointment and no new complaints today. Denies any systemic complaints such as fevers, chills, nausea, vomiting.   Objective: AAO 3, NAD DP/PT pulses palpable, CRT less than 3 seconds Nails hypertrophic, dystrophic, elongated, brittle, discolored 10. There is tenderness overlying the nails 1-5 bilaterally. There is no surrounding erythema or drainage along the nail sites. Interdigital maceration is present without any skin breakdown. No drainage or pus, edema, or other signs of infection.  No open lesions or pre-ulcerative lesions are identified. No other areas of tenderness bilateral lower extremities. No overlying edema, erythema, increased warmth. No pain with calf compression, swelling, warmth, erythema.  Assessment: Patient presents with symptomatic onychomycosis; interdigital skin maceration  Plan: -Treatment options including alternatives, risks, complications were discussed -Nails sharply debrided 10 without complication/bleeding -For the maceration dicussed moisture control measures. Rx castellani's paint and directed on use.  -Discussed daily foot inspection. If there are any changes, to call the office immediately.  -Follow-up in 3 months or sooner if any problems are to arise. In the meantime, encouraged to call the office with any questions, concerns, changes symptoms.  Ovid Curd, DPM

## 2017-05-04 ENCOUNTER — Encounter: Payer: Self-pay | Admitting: Internal Medicine

## 2017-05-04 ENCOUNTER — Ambulatory Visit: Payer: PPO | Attending: Internal Medicine | Admitting: Internal Medicine

## 2017-05-04 VITALS — BP 135/97 | HR 75 | Temp 98.1°F | Resp 18 | Ht 60.6 in | Wt 215.4 lb

## 2017-05-04 DIAGNOSIS — E119 Type 2 diabetes mellitus without complications: Secondary | ICD-10-CM | POA: Diagnosis present

## 2017-05-04 DIAGNOSIS — M109 Gout, unspecified: Secondary | ICD-10-CM | POA: Diagnosis not present

## 2017-05-04 DIAGNOSIS — I252 Old myocardial infarction: Secondary | ICD-10-CM | POA: Insufficient documentation

## 2017-05-04 DIAGNOSIS — J302 Other seasonal allergic rhinitis: Secondary | ICD-10-CM | POA: Insufficient documentation

## 2017-05-04 DIAGNOSIS — K219 Gastro-esophageal reflux disease without esophagitis: Secondary | ICD-10-CM | POA: Insufficient documentation

## 2017-05-04 DIAGNOSIS — Z8673 Personal history of transient ischemic attack (TIA), and cerebral infarction without residual deficits: Secondary | ICD-10-CM | POA: Insufficient documentation

## 2017-05-04 DIAGNOSIS — I2510 Atherosclerotic heart disease of native coronary artery without angina pectoris: Secondary | ICD-10-CM | POA: Insufficient documentation

## 2017-05-04 DIAGNOSIS — I10 Essential (primary) hypertension: Secondary | ICD-10-CM | POA: Diagnosis not present

## 2017-05-04 DIAGNOSIS — Z1211 Encounter for screening for malignant neoplasm of colon: Secondary | ICD-10-CM | POA: Diagnosis not present

## 2017-05-04 DIAGNOSIS — Z7982 Long term (current) use of aspirin: Secondary | ICD-10-CM | POA: Diagnosis not present

## 2017-05-04 DIAGNOSIS — Z885 Allergy status to narcotic agent status: Secondary | ICD-10-CM | POA: Diagnosis not present

## 2017-05-04 DIAGNOSIS — Z7984 Long term (current) use of oral hypoglycemic drugs: Secondary | ICD-10-CM | POA: Insufficient documentation

## 2017-05-04 DIAGNOSIS — E1121 Type 2 diabetes mellitus with diabetic nephropathy: Secondary | ICD-10-CM | POA: Insufficient documentation

## 2017-05-04 DIAGNOSIS — E785 Hyperlipidemia, unspecified: Secondary | ICD-10-CM | POA: Insufficient documentation

## 2017-05-04 DIAGNOSIS — Z79899 Other long term (current) drug therapy: Secondary | ICD-10-CM | POA: Insufficient documentation

## 2017-05-04 DIAGNOSIS — J3089 Other allergic rhinitis: Secondary | ICD-10-CM | POA: Diagnosis not present

## 2017-05-04 DIAGNOSIS — E7800 Pure hypercholesterolemia, unspecified: Secondary | ICD-10-CM | POA: Insufficient documentation

## 2017-05-04 DIAGNOSIS — Z951 Presence of aortocoronary bypass graft: Secondary | ICD-10-CM | POA: Diagnosis not present

## 2017-05-04 LAB — POCT GLYCOSYLATED HEMOGLOBIN (HGB A1C): HEMOGLOBIN A1C: 5.4

## 2017-05-04 MED ORDER — FLUTICASONE PROPIONATE 50 MCG/ACT NA SUSP: 2.0000 | Freq: Every day | NASAL | 6 refills | Status: DC

## 2017-05-04 MED ORDER — LORATADINE 10 MG PO TABS: 10.0000 mg | ORAL_TABLET | Freq: Every day | ORAL | 11 refills | Status: DC

## 2017-05-04 NOTE — Progress Notes (Signed)
[name], is a 56 y.o. male  [id number]  [id number]  DOB - April 22, 1961  Chief Complaint  Patient presents with  . Follow-up      Subjective:   [name] is a 57 y.o. male with history of type 2 diabetes mellitus, hypertension, hyperlipidemia, gout and coronary artery disease status post CABG in March 2017 here today for a routine follow up visit of diabetes and HTN. Except for his seasonal allergy flare up, he has no complaint today. He is adherent to medications, no side effect, reports no hypoglycemia Previous HbA1C was 5.8%, today it's 5.4%. BP has been controlled at home per patient. He denies being depressed, denies any suicidal thoughts or ideation. Patient has No headache, No chest pain, No abdominal pain - No Nausea, No new weakness tingling or numbness, No Cough - SOB.  Problem  Colon Cancer Screening  Allergic Rhinitis Due to Allergen   Qualifier: Diagnosis of  By: Daphine Deutscher FNP, Zena Amos      ALLERGIES: Allergies  Allergen Reactions  . Other Anaphylaxis    mushrooms  . Hydrocodone-Acetaminophen Itching    PAST MEDICAL HISTORY: Past Medical History:  Diagnosis Date  . Anemia   . Baker's cyst of knee   . Coronary artery disease    quadruple bypass - March 2016  . GERD (gastroesophageal reflux disease)   . Gouty arthritis    "real bad" (01/17/2013)  . Heart murmur   . Hypercholesteremia   . Hypertension   . Myocardial infarction (HCC)   . Stroke (HCC)   . Type II diabetes mellitus (HCC)     MEDICATIONS AT HOME: Prior to Admission medications   Medication Sig Start Date End Date Taking? Authorizing Provider  allopurinol (ZYLOPRIM) 300 MG tablet Take 1 tablet (300 mg total) by mouth daily. 11/03/16  Yes Quentin Angst, MD  aspirin EC 81 MG tablet Take 81 mg by mouth daily. Aspirin 81 mg  two times daily.   Yes [provider]  atorvastatin (LIPITOR) 80 MG tablet Take 1 tablet (80 mg total) by mouth daily at 6 PM. 11/03/16  Yes  Kalianne Fetting, Phylliss Blakes, MD  Robyne Askew Paint 1.5 % LIQD Apply 1 application topically daily. 04/25/17  Yes Vivi Barrack, DPM  cyclobenzaprine (FLEXERIL) 10 MG tablet Take 1 tablet (10 mg total) by mouth 3 (three) times daily as needed for muscle spasms. 03/20/17  Yes Azalia Bilis, MD  ferrous sulfate 325 (65 FE) MG tablet Take 1 tablet (325 mg total) by mouth daily with breakfast. 11/03/16  Yes Shanira Tine E, MD  furosemide (LASIX) 40 MG tablet Take 1 tablet (40 mg total) by mouth daily. 11/03/16  Yes Quentin Angst, MD  ibuprofen (ADVIL,MOTRIN) 600 MG tablet Take 1 tablet (600 mg total) by mouth every 8 (eight) hours as needed. 03/20/17  Yes Azalia Bilis, MD  lisinopril (PRINIVIL,ZESTRIL) 5 MG tablet Take 1 tablet (5 mg total) by mouth daily. 11/03/16  Yes Quentin Angst, MD  metFORMIN (GLUCOPHAGE) 500 MG tablet Take 1 tablet (500 mg total) by mouth daily with breakfast. Patient taking differently: Take 500 mg by mouth 2 (two) times daily with a meal.  11/03/16  Yes Tekia Waterbury E, MD  metoprolol tartrate (LOPRESSOR) 25 MG tablet Take 1 tablet (25 mg total) by mouth 2 (two) times daily. 11/03/16  Yes Ellieana Dolecki, Phylliss Blakes, MD  potassium chloride SA (KLOR-CON M20) 20 MEQ tablet Take 1 tablet (20 mEq total) by mouth daily. 11/03/16  Yes Carzell Saldivar E,  MD  fluticasone (FLONASE) 50 MCG/ACT nasal spray Place 2 sprays into both nostrils daily. 05/04/17   Quentin Angst, MD  loratadine (CLARITIN) 10 MG tablet Take 1 tablet (10 mg total) by mouth daily. 05/04/17   Quentin Angst, MD    Objective:   Vitals:   05/04/17 0920  BP: (!) 135/97  Pulse: 75  Resp: 18  Temp: 98.1 F (36.7 C)  TempSrc: Oral  SpO2: 99%  Weight: 215 lb 6.4 oz (97.7 kg)  Height: 5' 0.6" (1.539 m)   Exam General appearance : Awake, alert, not in any distress. Speech Clear. Not toxic looking, obese HEENT: Atraumatic and Normocephalic, pupils equally reactive to light and  accomodation Neck: Supple, no JVD. No cervical lymphadenopathy.  Chest: Good air entry bilaterally, no added sounds  CVS: S1 S2 regular, systolic ejection murmurs.  Abdomen: Bowel sounds present, Non tender and not distended with no gaurding, rigidity or rebound. Extremities: B/L Lower Ext shows no edema, both legs are warm to touch Neurology: Awake alert, and oriented X 3, CN II-XII intact, Non focal Skin: No Rash  Data Review Lab Results  Component Value Date   HGBA1C 5.4 05/04/2017   HGBA1C 5.8 11/03/2016   HGBA1C 5.8 06/24/2016    Assessment & Plan   1. Seasonal allergic rhinitis due to other allergic trigger  - loratadine (CLARITIN) 10 MG tablet; Take 1 tablet (10 mg total) by mouth daily.  Dispense: 30 tablet; Refill: 11 - fluticasone (FLONASE) 50 MCG/ACT nasal spray; Place 2 sprays into both nostrils daily.  Dispense: 16 g; Refill: 6  2. Controlled type 2 diabetes mellitus with diabetic nephropathy, without long-term current use of insulin (HCC)  - POCT A1C - Reduce Metformin to 500 mg once daily - Will discontinue if HA1C remains below 5.6% at next visit  3. Dyslipidemia  To address this please limit saturated fat to no more than 7% of your calories, limit cholesterol to 200 mg/day, increase fiber and exercise as tolerated. If needed we may add another cholesterol lowering medication to your regimen.   4. Essential hypertension  We have discussed target BP range and blood pressure goal. I have advised patient to check BP regularly and to call us back or report to clinic if the numbers are consistently higher than 140/90. We discussed the importance of compliance with medical therapy and DASH diet recommended, consequences of uncontrolled hypertension discussed.  - continue current BP medications  5. Colon cancer screening  - Ambulatory referral to Gastroenterology  Patient have been counseled extensively about nutrition and exercise. Other issues discussed during  this visit include: low cholesterol diet, weight control and daily exercise, foot care, annual eye examinations at Ophthalmology, importance of adherence with medications and regular follow-up. We also discussed long term complications of uncontrolled diabetes and hypertension.   Return in about 6 months (around 11/03/2017) for Hemoglobin A1C and Follow up, DM, Follow up HTN.  The patient was given clear instructions to go to ER or return to medical center if symptoms don't improve, worsen or new problems develop. The patient verbalized understanding. The patient was told to call to get lab results if they haven't heard anything in the next week.   This note has been created with Education officer, environmental. Any transcriptional errors are unintentional.    Jeanann Lewandowsky, MD, MHA, Maxwell Caul, CPE [facility] [place], [place] [phone]   05/04/2017, 9:59 AM

## 2017-05-04 NOTE — Patient Instructions (Signed)
DASH Eating Plan DASH stands for "Dietary Approaches to Stop Hypertension." The DASH eating plan is a healthy eating plan that has been shown to reduce high blood pressure (hypertension). It may also reduce your risk for type 2 diabetes, heart disease, and stroke. The DASH eating plan may also help with weight loss. What are tips for following this plan? General guidelines  Avoid eating more than 2,300 mg (milligrams) of salt (sodium) a day. If you have hypertension, you may need to reduce your sodium intake to 1,500 mg a day.  Limit alcohol intake to no more than 1 drink a day for nonpregnant women and 2 drinks a day for men. One drink equals 12 oz of beer, 5 oz of wine, or 1 oz of hard liquor.  Work with your health care provider to maintain a healthy body weight or to lose weight. Ask what an ideal weight is for you.  Get at least 30 minutes of exercise that causes your heart to beat faster (aerobic exercise) most days of the week. Activities may include walking, swimming, or biking.  Work with your health care provider or diet and nutrition specialist (dietitian) to adjust your eating plan to your individual calorie needs. Reading food labels  Check food labels for the amount of sodium per serving. Choose foods with less than 5 percent of the Daily Value of sodium. Generally, foods with less than 300 mg of sodium per serving fit into this eating plan.  To find whole grains, look for the word "whole" as the first word in the ingredient list. Shopping  Buy products labeled as "low-sodium" or "no salt added."  Buy fresh foods. Avoid canned foods and premade or frozen meals. Cooking  Avoid adding salt when cooking. Use salt-free seasonings or herbs instead of table salt or sea salt. Check with your health care provider or pharmacist before using salt substitutes.  Do not fry foods. Cook foods using healthy methods such as baking, boiling, grilling, and broiling instead.  Cook with  heart-healthy oils, such as olive, canola, soybean, or sunflower oil. Meal planning   Eat a balanced diet that includes: ? 5 or more servings of fruits and vegetables each day. At each meal, try to fill half of your plate with fruits and vegetables. ? Up to 6-8 servings of whole grains each day. ? Less than 6 oz of lean meat, poultry, or fish each day. A 3-oz serving of meat is about the same size as a deck of cards. One egg equals 1 oz. ? 2 servings of low-fat dairy each day. ? A serving of nuts, seeds, or beans 5 times each week. ? Heart-healthy fats. Healthy fats called Omega-3 fatty acids are found in foods such as flaxseeds and coldwater fish, like sardines, salmon, and mackerel.  Limit how much you eat of the following: ? Canned or prepackaged foods. ? Food that is high in trans fat, such as fried foods. ? Food that is high in saturated fat, such as fatty meat. ? Sweets, desserts, sugary drinks, and other foods with added sugar. ? Full-fat dairy products.  Do not salt foods before eating.  Try to eat at least 2 vegetarian meals each week.  Eat more home-cooked food and less restaurant, buffet, and fast food.  When eating at a restaurant, ask that your food be prepared with less salt or no salt, if possible. What foods are recommended? The items listed may not be a complete list. Talk with your dietitian about what   dietary choices are best for you. Grains Whole-grain or whole-wheat bread. Whole-grain or whole-wheat pasta. Brown rice. Oatmeal. Quinoa. Bulgur. Whole-grain and low-sodium cereals. Pita bread. Low-fat, low-sodium crackers. Whole-wheat flour tortillas. Vegetables Fresh or frozen vegetables (raw, steamed, roasted, or grilled). Low-sodium or reduced-sodium tomato and vegetable juice. Low-sodium or reduced-sodium tomato sauce and tomato paste. Low-sodium or reduced-sodium canned vegetables. Fruits All fresh, dried, or frozen fruit. Canned fruit in natural juice (without  added sugar). Meat and other protein foods Skinless chicken or turkey. Ground chicken or turkey. Pork with fat trimmed off. Fish and seafood. Egg whites. Dried beans, peas, or lentils. Unsalted nuts, nut butters, and seeds. Unsalted canned beans. Lean cuts of beef with fat trimmed off. Low-sodium, lean deli meat. Dairy Low-fat (1%) or fat-free (skim) milk. Fat-free, low-fat, or reduced-fat cheeses. Nonfat, low-sodium ricotta or cottage cheese. Low-fat or nonfat yogurt. Low-fat, low-sodium cheese. Fats and oils Soft margarine without trans fats. Vegetable oil. Low-fat, reduced-fat, or light mayonnaise and salad dressings (reduced-sodium). Canola, safflower, olive, soybean, and sunflower oils. Avocado. Seasoning and other foods Herbs. Spices. Seasoning mixes without salt. Unsalted popcorn and pretzels. Fat-free sweets. What foods are not recommended? The items listed may not be a complete list. Talk with your dietitian about what dietary choices are best for you. Grains Baked goods made with fat, such as croissants, muffins, or some breads. Dry pasta or rice meal packs. Vegetables Creamed or fried vegetables. Vegetables in a cheese sauce. Regular canned vegetables (not low-sodium or reduced-sodium). Regular canned tomato sauce and paste (not low-sodium or reduced-sodium). Regular tomato and vegetable juice (not low-sodium or reduced-sodium). Pickles. Olives. Fruits Canned fruit in a light or heavy syrup. Fried fruit. Fruit in cream or butter sauce. Meat and other protein foods Fatty cuts of meat. Ribs. Fried meat. Bacon. Sausage. Bologna and other processed lunch meats. Salami. Fatback. Hotdogs. Bratwurst. Salted nuts and seeds. Canned beans with added salt. Canned or smoked fish. Whole eggs or egg yolks. Chicken or turkey with skin. Dairy Whole or 2% milk, cream, and half-and-half. Whole or full-fat cream cheese. Whole-fat or sweetened yogurt. Full-fat cheese. Nondairy creamers. Whipped toppings.  Processed cheese and cheese spreads. Fats and oils Butter. Stick margarine. Lard. Shortening. Ghee. Bacon fat. Tropical oils, such as coconut, palm kernel, or palm oil. Seasoning and other foods Salted popcorn and pretzels. Onion salt, garlic salt, seasoned salt, table salt, and sea salt. Worcestershire sauce. Tartar sauce. Barbecue sauce. Teriyaki sauce. Soy sauce, including reduced-sodium. Steak sauce. Canned and packaged gravies. Fish sauce. Oyster sauce. Cocktail sauce. Horseradish that you find on the shelf. Ketchup. Mustard. Meat flavorings and tenderizers. Bouillon cubes. Hot sauce and Tabasco sauce. Premade or packaged marinades. Premade or packaged taco seasonings. Relishes. Regular salad dressings. Where to find more information:  National Heart, Lung, and Blood Institute: www.nhlbi.nih.gov  American Heart Association: www.heart.org Summary  The DASH eating plan is a healthy eating plan that has been shown to reduce high blood pressure (hypertension). It may also reduce your risk for type 2 diabetes, heart disease, and stroke.  With the DASH eating plan, you should limit salt (sodium) intake to 2,300 mg a day. If you have hypertension, you may need to reduce your sodium intake to 1,500 mg a day.  When on the DASH eating plan, aim to eat more fresh fruits and vegetables, whole grains, lean proteins, low-fat dairy, and heart-healthy fats.  Work with your health care provider or diet and nutrition specialist (dietitian) to adjust your eating plan to your individual   calorie needs. This information is not intended to replace advice given to you by your health care provider. Make sure you discuss any questions you have with your health care provider. Document Released: 08/12/2011 Document Revised: 08/16/2016 Document Reviewed: 08/16/2016 Elsevier Interactive Patient Education  2017 [employer] Hypertension Hypertension, commonly called high blood pressure, is when the force of blood  pumping through the arteries is too strong. The arteries are the blood vessels that carry blood from the heart throughout the body. Hypertension forces the heart to work harder to pump blood and may cause arteries to become narrow or stiff. Having untreated or uncontrolled hypertension can cause heart attacks, strokes, kidney disease, and other problems. A blood pressure reading consists of a higher number over a lower number. Ideally, your blood pressure should be below 120/80. The first ("top") number is called the systolic pressure. It is a measure of the pressure in your arteries as your heart beats. The second ("bottom") number is called the diastolic pressure. It is a measure of the pressure in your arteries as the heart relaxes. What are the causes? The cause of this condition is not known. What increases the risk? Some risk factors for high blood pressure are under your control. Others are not. Factors you can change  Smoking.  Having type 2 diabetes mellitus, high cholesterol, or both.  Not getting enough exercise or physical activity.  Being overweight.  Having too much fat, sugar, calories, or salt (sodium) in your diet.  Drinking too much alcohol. Factors that are difficult or impossible to change  Having chronic kidney disease.  Having a family history of high blood pressure.  Age. Risk increases with age.  Race. You may be at higher risk if you are African-American.  Gender. Men are at higher risk than women before age 45. After age 65, women are at higher risk than men.  Having obstructive sleep apnea.  Stress. What are the signs or symptoms? Extremely high blood pressure (hypertensive crisis) may cause:  Headache.  Anxiety.  Shortness of breath.  Nosebleed.  Nausea and vomiting.  Severe chest pain.  Jerky movements you cannot control (seizures).  How is this diagnosed? This condition is diagnosed by measuring your blood pressure while you are  seated, with your arm resting on a surface. The cuff of the blood pressure monitor will be placed directly against the skin of your upper arm at the level of your heart. It should be measured at least twice using the same arm. Certain conditions can cause a difference in blood pressure between your right and left arms. Certain factors can cause blood pressure readings to be lower or higher than normal (elevated) for a short period of time:  When your blood pressure is higher when you are in a health care provider's office than when you are at home, this is called white coat hypertension. Most people with this condition do not need medicines.  When your blood pressure is higher at home than when you are in a health care provider's office, this is called masked hypertension. Most people with this condition may need medicines to control blood pressure.  If you have a high blood pressure reading during one visit or you have normal blood pressure with other risk factors:  You may be asked to return on a different day to have your blood pressure checked again.  You may be asked to monitor your blood pressure at home for 1 week or longer.  If you are diagnosed   with hypertension, you may have other blood or imaging tests to help your health care provider understand your overall risk for other conditions. How is this treated? This condition is treated by making healthy lifestyle changes, such as eating healthy foods, exercising more, and reducing your alcohol intake. Your health care provider may prescribe medicine if lifestyle changes are not enough to get your blood pressure under control, and if:  Your systolic blood pressure is above 130.  Your diastolic blood pressure is above 80.  Your personal target blood pressure may vary depending on your medical conditions, your age, and other factors. Follow these instructions at home: Eating and drinking  Eat a diet that is high in fiber and potassium,  and low in sodium, added sugar, and fat. An example eating plan is called the DASH (Dietary Approaches to Stop Hypertension) diet. To eat this way: ? Eat plenty of fresh fruits and vegetables. Try to fill half of your plate at each meal with fruits and vegetables. ? Eat whole grains, such as whole wheat pasta, brown rice, or whole grain bread. Fill about one quarter of your plate with whole grains. ? Eat or drink low-fat dairy products, such as skim milk or low-fat yogurt. ? Avoid fatty cuts of meat, processed or cured meats, and poultry with skin. Fill about one quarter of your plate with lean proteins, such as fish, chicken without skin, beans, eggs, and tofu. ? Avoid premade and processed foods. These tend to be higher in sodium, added sugar, and fat.  Reduce your daily sodium intake. Most people with hypertension should eat less than 1,500 mg of sodium a day.  Limit alcohol intake to no more than 1 drink a day for nonpregnant women and 2 drinks a day for men. One drink equals 12 oz of beer, 5 oz of wine, or 1 oz of hard liquor. Lifestyle  Work with your health care provider to maintain a healthy body weight or to lose weight. Ask what an ideal weight is for you.  Get at least 30 minutes of exercise that causes your heart to beat faster (aerobic exercise) most days of the week. Activities may include walking, swimming, or biking.  Include exercise to strengthen your muscles (resistance exercise), such as pilates or lifting weights, as part of your weekly exercise routine. Try to do these types of exercises for 30 minutes at least 3 days a week.  Do not use any products that contain nicotine or tobacco, such as cigarettes and e-cigarettes. If you need help quitting, ask your health care provider.  Monitor your blood pressure at home as told by your health care provider.  Keep all follow-up visits as told by your health care provider. This is important. Medicines  Take over-the-counter and  prescription medicines only as told by your health care provider. Follow directions carefully. Blood pressure medicines must be taken as prescribed.  Do not skip doses of blood pressure medicine. Doing this puts you at risk for problems and can make the medicine less effective.  Ask your health care provider about side effects or reactions to medicines that you should watch for. Contact a health care provider if:  You think you are having a reaction to a medicine you are taking.  You have headaches that keep coming back (recurring).  You feel dizzy.  You have swelling in your ankles.  You have trouble with your vision. Get help right away if:  You develop a severe headache or confusion.  You   have unusual weakness or numbness.  You feel faint.  You have severe pain in your chest or abdomen.  You vomit repeatedly.  You have trouble breathing. Summary  Hypertension is when the force of blood pumping through your arteries is too strong. If this condition is not controlled, it may put you at risk for serious complications.  Your personal target blood pressure may vary depending on your medical conditions, your age, and other factors. For most people, a normal blood pressure is less than 120/80.  Hypertension is treated with lifestyle changes, medicines, or a combination of both. Lifestyle changes include weight loss, eating a healthy, low-sodium diet, exercising more, and limiting alcohol. This information is not intended to replace advice given to you by your health care provider. Make sure you discuss any questions you have with your health care provider. Document Released: 08/23/2005 Document Revised: 07/21/2016 Document Reviewed: 07/21/2016 Elsevier Interactive Patient Education  2018 [employer] Inc. Blood Glucose Monitoring, Adult Monitoring your blood sugar (glucose) helps you manage your diabetes. It also helps you and your health care provider determine how well your  diabetes management plan is working. Blood glucose monitoring involves checking your blood glucose as often as directed, and keeping a record (log) of your results over time. Why should I monitor my blood glucose? Checking your blood glucose regularly can:  Help you understand how food, exercise, illnesses, and medicines affect your blood glucose.  Let you know what your blood glucose is at any time. You can quickly tell if you are having low blood glucose (hypoglycemia) or high blood glucose (hyperglycemia).  Help you and your health care provider adjust your medicines as needed.  When should I check my blood glucose? Follow instructions from your health care provider about how often to check your blood glucose. This may depend on:  The type of diabetes you have.  How well-controlled your diabetes is.  Medicines you are taking.  If you have type 1 diabetes:  Check your blood glucose at least 2 times a day.  Also check your blood glucose: ? Before every insulin injection. ? Before and after exercise. ? Between meals. ? 2 hours after a meal. ? Occasionally between 2:00 a.m. and 3:00 a.m., as directed. ? Before potentially dangerous tasks, like driving or using heavy machinery. ? At bedtime.  You may need to check your blood glucose more often, up to 6-10 times a day: ? If you use an insulin pump. ? If you need multiple daily injections (MDI). ? If your diabetes is not well-controlled. ? If you are ill. ? If you have a history of severe hypoglycemia. ? If you have a history of not knowing when your blood glucose is getting low (hypoglycemia unawareness). If you have type 2 diabetes:  If you take insulin or other diabetes medicines, check your blood glucose at least 2 times a day.  If you are on intensive insulin therapy, check your blood glucose at least 4 times a day. Occasionally, you may also need to check between 2:00 a.m. and 3:00 a.m., as directed.  Also check your  blood glucose: ? Before and after exercise. ? Before potentially dangerous tasks, like driving or using heavy machinery.  You may need to check your blood glucose more often if: ? Your medicine is being adjusted. ? Your diabetes is not well-controlled. ? You are ill. What is a blood glucose log?  A blood glucose log is a record of your blood glucose readings. It helps you   and your health care provider: ? Look for patterns in your blood glucose over time. ? Adjust your diabetes management plan as needed.  Every time you check your blood glucose, write down your result and notes about things that may be affecting your blood glucose, such as your diet and exercise for the day.  Most glucose meters store a record of glucose readings in the meter. Some meters allow you to download your records to a computer. How do I check my blood glucose? Follow these steps to get accurate readings of your blood glucose: Supplies needed   Blood glucose meter.  Test strips for your meter. Each meter has its own strips. You must use the strips that come with your meter.  A needle to prick your finger (lancet). Do not use lancets more than once.  A device that holds the lancet (lancing device).  A journal or log book to write down your results. Procedure  Wash your hands with soap and water.  Prick the side of your finger (not the tip) with the lancet. Use a different finger each time.  Gently rub the finger until a small drop of blood appears.  Follow instructions that come with your meter for inserting the test strip, applying blood to the strip, and using your blood glucose meter.  Write down your result and any notes. Alternative testing sites  Some meters allow you to use areas of your body other than your finger (alternative sites) to test your blood.  If you think you may have hypoglycemia, or if you have hypoglycemia unawareness, do not use alternative sites. Use your finger  instead.  Alternative sites may not be as accurate as the fingers, because blood flow is slower in these areas. This means that the result you get may be delayed, and it may be different from the result that you would get from your finger.  The most common alternative sites are: ? Forearm. ? Thigh. ? Palm of the hand. Additional tips  Always keep your supplies with you.  If you have questions or need help, all blood glucose meters have a 24-hour "hotline" number that you can call. You may also contact your health care provider.  After you use a few boxes of test strips, adjust (calibrate) your blood glucose meter by following instructions that came with your meter. This information is not intended to replace advice given to you by your health care provider. Make sure you discuss any questions you have with your health care provider. Document Released: 08/26/2003 Document Revised: 03/12/2016 Document Reviewed: 02/02/2016 Elsevier Interactive Patient Education  2017 [employer].

## 2017-05-12 ENCOUNTER — Encounter: Payer: Self-pay | Admitting: Gastroenterology

## 2017-06-08 ENCOUNTER — Other Ambulatory Visit: Payer: Self-pay | Admitting: Internal Medicine

## 2017-06-08 DIAGNOSIS — E785 Hyperlipidemia, unspecified: Secondary | ICD-10-CM

## 2017-06-15 ENCOUNTER — Other Ambulatory Visit: Payer: Self-pay | Admitting: Internal Medicine

## 2017-06-15 DIAGNOSIS — Z1211 Encounter for screening for malignant neoplasm of colon: Secondary | ICD-10-CM

## 2017-07-01 ENCOUNTER — Ambulatory Visit (AMBULATORY_SURGERY_CENTER): Payer: Self-pay | Admitting: *Deleted

## 2017-07-01 VITALS — Ht 66.0 in | Wt 225.0 lb

## 2017-07-01 DIAGNOSIS — Z1211 Encounter for screening for malignant neoplasm of colon: Secondary | ICD-10-CM

## 2017-07-01 NOTE — Progress Notes (Signed)
Pt has a bottle of plavix in his medicine bag but states he has not had any plavix in 3 months or longer- he has no refills on the plavix bottle- he had a AV replacement , got the med but no refills- instructed if he has to restart this before his colon, he will need an OV   Has AV replacement with hx MI 11-2015  No egg or soy allergy known to patient  No issues with past sedation with any surgeries  or procedures, no intubation problems  No diet pills per patient No home 02 use per patient  No blood thinners per patient  Pt states  issues with constipation with metformin and if takes IRON- now soft since off iron pills  No A fib or A flutter  EMMI video sent to pt's e mail   12-27-16 pt had a PV and has a suprep kit at home already

## 2017-07-05 ENCOUNTER — Encounter: Payer: Self-pay | Admitting: Gastroenterology

## 2017-07-12 ENCOUNTER — Encounter: Payer: Self-pay | Admitting: Gastroenterology

## 2017-07-26 ENCOUNTER — Encounter: Payer: Self-pay | Admitting: Gastroenterology

## 2017-08-01 ENCOUNTER — Ambulatory Visit: Payer: PPO | Admitting: Podiatry

## 2017-10-27 ENCOUNTER — Telehealth: Payer: Self-pay | Admitting: Internal Medicine

## 2017-10-27 NOTE — Telephone Encounter (Signed)
Marcelino Duster called and requested for notes from late visit needed and approval is required for diabetic eye exam. Please fu. Appt is March 6th at 8:45am.

## 2017-11-04 ENCOUNTER — Telehealth: Payer: Self-pay | Admitting: Internal Medicine

## 2017-11-04 NOTE — Telephone Encounter (Signed)
[facility] called in regards to this pt's referral to request all notes faxed over [phone]

## 2017-11-08 NOTE — Progress Notes (Deleted)
[facility] - Clinic Note  11/09/2017     CHIEF COMPLAINT Patient presents for No chief complaint on file.   HISTORY OF PRESENT ILLNESS: [name] is a 57 y.o. male who presents to the clinic today for:     Referring physician: Quentin Angst, MD [address] [place], [place] [address]  HISTORICAL INFORMATION:   Selected notes from the MEDICAL RECORD NUMBER Referred by Dr. Hyman Hopes for DM exam;  LEE-  Ocular Hx-  PMH- DM (last A1C - 5.4, last CBG - 129 (07.15.18), HTN, CVA, hx heart bypass    CURRENT MEDICATIONS: No current outpatient medications on file. (Ophthalmic Drugs)   No current facility-administered medications for this visit.  (Ophthalmic Drugs)   Current Outpatient Medications (Other)  Medication Sig   allopurinol (ZYLOPRIM) 300 MG tablet Take 1 tablet (300 mg total) by mouth daily.   aspirin EC 81 MG tablet Take 81 mg by mouth daily. Aspirin 81 mg  two times daily.   atorvastatin (LIPITOR) 80 MG tablet TAKE ONE TABLET BY MOUTH ONCE DAILY AT  6  PM   Castellani Paint 1.5 % LIQD Apply 1 application topically daily. (Patient not taking: Reported on 07/01/2017)   cyclobenzaprine (FLEXERIL) 10 MG tablet Take 1 tablet (10 mg total) by mouth 3 (three) times daily as needed for muscle spasms. (Patient not taking: Reported on 07/01/2017)   Ferrous Sulfate (IRON) 325 (65 Fe) MG TABS TAKE 1 TABLET BY MOUTH ONCE DAILY WITH BREAKFAST (Patient not taking: Reported on 07/01/2017)   fluticasone (FLONASE) 50 MCG/ACT nasal spray Place 2 sprays into both nostrils daily.   folic acid (FOLVITE) 1 MG tablet Take 1 mg by mouth daily.   furosemide (LASIX) 40 MG tablet Take 1 tablet (40 mg total) by mouth daily.   ibuprofen (ADVIL,MOTRIN) 600 MG tablet Take 1 tablet (600 mg total) by mouth every 8 (eight) hours as needed.   lisinopril (PRINIVIL,ZESTRIL) 5 MG tablet Take 1 tablet (5 mg total) by mouth daily. (Patient not taking: Reported on  07/01/2017)   loratadine (CLARITIN) 10 MG tablet Take 1 tablet (10 mg total) by mouth daily.   metFORMIN (GLUCOPHAGE) 500 MG tablet Take 1 tablet (500 mg total) by mouth daily with breakfast. (Patient taking differently: Take 500 mg by mouth 2 (two) times daily with a meal. )   metoprolol tartrate (LOPRESSOR) 25 MG tablet Take 1 tablet (25 mg total) by mouth 2 (two) times daily.   potassium chloride SA (KLOR-CON M20) 20 MEQ tablet Take 1 tablet (20 mEq total) by mouth daily.   No current facility-administered medications for this visit.  (Other)      REVIEW OF SYSTEMS:    ALLERGIES Allergies  Allergen Reactions   Other Anaphylaxis    mushrooms   Hydrocodone-Acetaminophen Itching    PAST MEDICAL HISTORY Past Medical History:  Diagnosis Date   Allergy    Anemia    Anxiety    Baker's cyst of knee    Blood transfusion without reported diagnosis    as baby    Coronary artery disease    quadruple bypass - March 2016   GERD (gastroesophageal reflux disease)    Gouty arthritis    "real bad" (01/17/2013)   Heart murmur    Hypercholesteremia    Hypertension    Myocardial infarction (HCC) 2017   Stroke (HCC)    Type II diabetes mellitus (HCC)    Past Surgical History:  Procedure Laterality Date   AORTIC  VALVE REPLACEMENT N/A 11/25/2015   Procedure: AORTIC VALVE REPLACEMENT (AVR) USING A MAGNA EASE ;  Surgeon: Alleen Borne, MD;  Location: [facility] OR;  Service: Open Heart Surgery;  Laterality: N/A;   CARDIAC CATHETERIZATION N/A 11/19/2015   Procedure: Right/Left Heart Cath and Coronary Angiography;  Surgeon: Lyn Records, MD;  Location: [facility] INVASIVE CV LAB;  Service: Cardiovascular;  Laterality: N/A;   COLONOSCOPY  2004   CORONARY ARTERY BYPASS GRAFT N/A 11/25/2015   Procedure: CORONARY ARTERY BYPASS GRAFTING (CABG)x4 LIMA-LAD; SVG-OM; SVG-RCA; SVG-DIAG;  Surgeon: Alleen Borne, MD;  Location: [facility] OR;  Service: Open Heart Surgery;  Laterality: N/A;    CYST REMOVAL TRUNK N/A 08/24/2016   Procedure: EXCISION OF BACK ABSCESS;  Surgeon: Jimmye Norman, MD;  Location: [facility];  Service: General;  Laterality: N/A;   FINGER AMPUTATION Right 1980's   3rd & 4th digits "got them mashed" (01/17/2013)   KNEE ARTHROSCOPY Right 1980's   MULTIPLE EXTRACTIONS WITH ALVEOLOPLASTY Bilateral 11/21/2015   Procedure: Extraction of tooth #'s 2,12 with alveoloplasty and gross debridement of remaining dentition;  Surgeon: Charlynne Pander, DDS;  Location: [facility] OR;  Service: Oral Surgery;  Laterality: Bilateral;   TEE WITHOUT CARDIOVERSION N/A 11/20/2015   Procedure: TRANSESOPHAGEAL ECHOCARDIOGRAM (TEE);  Surgeon: Laurey Morale, MD;  Location: [facility] ENDOSCOPY;  Service: Cardiovascular;  Laterality: N/A;   TEE WITHOUT CARDIOVERSION N/A 11/25/2015   Procedure: TRANSESOPHAGEAL ECHOCARDIOGRAM (TEE);  Surgeon: Alleen Borne, MD;  Location: [facility] OR;  Service: Open Heart Surgery;  Laterality: N/A;    FAMILY HISTORY Family History  Problem Relation Age of Onset   Diabetes Father    Hypertension Father    Hypertension Mother    Hypertension Sister    Hypertension Brother    Hypertension Brother    Hypertension Brother    Colon cancer Neg Hx    Esophageal cancer Neg Hx    Rectal cancer Neg Hx    Stomach cancer Neg Hx    Colon polyps Neg Hx     SOCIAL HISTORY Social History   Tobacco Use   Smoking status: Never Smoker   Smokeless tobacco: Never Used  Substance Use Topics   Alcohol use: Yes    Comment: Drinks beer once a week   Drug use: Yes    Types: Marijuana    Comment: last smoked weed 2 months ago         OPHTHALMIC EXAM:  Not recorded      IMAGING AND PROCEDURES  Imaging and Procedures for 11/08/17           ASSESSMENT/PLAN:    ICD-10-CM   1. Retinal edema H35.81     1.  2.  3.  Ophthalmic Meds Ordered this visit:  No orders of the defined types were placed in this encounter.      No  Follow-up on file.  There are no Patient Instructions on file for this visit.   Explained the diagnoses, plan, and follow up with the patient and they expressed understanding.  Patient expressed understanding of the importance of proper follow up care.   This document serves as a record of services personally performed by Karie Chimera, MD, PhD. It was created on their behalf by Virgilio Belling, COA, a certified ophthalmic assistant. The creation of this record is the provider's dictation and/or activities during the visit.  Electronically signed by: Virgilio Belling, COA  11/08/17 8:11 AM    Karie Chimera, M.D., Ph.D. Diseases &  Surgery of the Retina and Vitreous [facility] 11/08/17     Abbreviations: M myopia (nearsighted); A astigmatism; H hyperopia (farsighted); P presbyopia; Mrx spectacle prescription;  CTL contact lenses; OD right eye; OS left eye; OU both eyes  XT exotropia; ET esotropia; PEK punctate epithelial keratitis; PEE punctate epithelial erosions; DES dry eye syndrome; MGD meibomian gland dysfunction; ATs artificial tears; PFAT's preservative free artificial tears; NSC nuclear sclerotic cataract; PSC posterior subcapsular cataract; ERM epi-retinal membrane; PVD posterior vitreous detachment; RD retinal detachment; DM diabetes mellitus; DR diabetic retinopathy; NPDR non-proliferative diabetic retinopathy; PDR proliferative diabetic retinopathy; CSME clinically significant macular edema; DME diabetic macular edema; dbh dot blot hemorrhages; CWS cotton wool spot; POAG primary open angle glaucoma; C/D cup-to-disc ratio; HVF humphrey visual field; GVF goldmann visual field; OCT optical coherence tomography; IOP intraocular pressure; BRVO Branch retinal vein occlusion; CRVO central retinal vein occlusion; CRAO central retinal artery occlusion; BRAO branch retinal artery occlusion; RT retinal tear; SB scleral buckle; PPV pars plana vitrectomy; VH Vitreous  hemorrhage; PRP panretinal laser photocoagulation; IVK intravitreal kenalog; VMT vitreomacular traction; MH Macular hole;  NVD neovascularization of the disc; NVE neovascularization elsewhere; AREDS age related eye disease study; ARMD age related macular degeneration; POAG primary open angle glaucoma; EBMD epithelial/anterior basement membrane dystrophy; ACIOL anterior chamber intraocular lens; IOL intraocular lens; PCIOL posterior chamber intraocular lens; Phaco/IOL phacoemulsification with intraocular lens placement; PRK photorefractive keratectomy; LASIK laser assisted in situ keratomileusis; HTN hypertension; DM diabetes mellitus; COPD chronic obstructive pulmonary disease

## 2017-11-09 ENCOUNTER — Encounter (INDEPENDENT_AMBULATORY_CARE_PROVIDER_SITE_OTHER): Payer: PPO | Admitting: Ophthalmology

## 2018-01-21 ENCOUNTER — Other Ambulatory Visit: Payer: Self-pay | Admitting: Internal Medicine

## 2018-01-21 DIAGNOSIS — I10 Essential (primary) hypertension: Secondary | ICD-10-CM

## 2018-01-23 ENCOUNTER — Ambulatory Visit: Payer: PPO | Attending: Family Medicine | Admitting: Family Medicine

## 2018-01-23 ENCOUNTER — Encounter: Payer: Self-pay | Admitting: Family Medicine

## 2018-01-23 ENCOUNTER — Other Ambulatory Visit: Payer: Self-pay

## 2018-01-23 VITALS — BP 170/110 | HR 71 | Temp 97.7°F | Ht 66.0 in | Wt 237.2 lb

## 2018-01-23 DIAGNOSIS — E7800 Pure hypercholesterolemia, unspecified: Secondary | ICD-10-CM | POA: Insufficient documentation

## 2018-01-23 DIAGNOSIS — I10 Essential (primary) hypertension: Secondary | ICD-10-CM | POA: Diagnosis not present

## 2018-01-23 DIAGNOSIS — K219 Gastro-esophageal reflux disease without esophagitis: Secondary | ICD-10-CM | POA: Diagnosis not present

## 2018-01-23 DIAGNOSIS — R600 Localized edema: Secondary | ICD-10-CM | POA: Diagnosis not present

## 2018-01-23 DIAGNOSIS — Z951 Presence of aortocoronary bypass graft: Secondary | ICD-10-CM | POA: Diagnosis not present

## 2018-01-23 DIAGNOSIS — Z89021 Acquired absence of right finger(s): Secondary | ICD-10-CM | POA: Diagnosis not present

## 2018-01-23 DIAGNOSIS — I2510 Atherosclerotic heart disease of native coronary artery without angina pectoris: Secondary | ICD-10-CM | POA: Insufficient documentation

## 2018-01-23 DIAGNOSIS — Z8673 Personal history of transient ischemic attack (TIA), and cerebral infarction without residual deficits: Secondary | ICD-10-CM | POA: Insufficient documentation

## 2018-01-23 DIAGNOSIS — Z952 Presence of prosthetic heart valve: Secondary | ICD-10-CM | POA: Diagnosis not present

## 2018-01-23 DIAGNOSIS — F419 Anxiety disorder, unspecified: Secondary | ICD-10-CM | POA: Diagnosis not present

## 2018-01-23 DIAGNOSIS — Z7984 Long term (current) use of oral hypoglycemic drugs: Secondary | ICD-10-CM | POA: Diagnosis not present

## 2018-01-23 DIAGNOSIS — Z9889 Other specified postprocedural states: Secondary | ICD-10-CM | POA: Diagnosis not present

## 2018-01-23 DIAGNOSIS — E119 Type 2 diabetes mellitus without complications: Secondary | ICD-10-CM | POA: Diagnosis not present

## 2018-01-23 DIAGNOSIS — Z91018 Allergy to other foods: Secondary | ICD-10-CM | POA: Diagnosis not present

## 2018-01-23 DIAGNOSIS — I4510 Unspecified right bundle-branch block: Secondary | ICD-10-CM | POA: Diagnosis not present

## 2018-01-23 DIAGNOSIS — I252 Old myocardial infarction: Secondary | ICD-10-CM | POA: Insufficient documentation

## 2018-01-23 DIAGNOSIS — Z79899 Other long term (current) drug therapy: Secondary | ICD-10-CM | POA: Diagnosis not present

## 2018-01-23 DIAGNOSIS — E785 Hyperlipidemia, unspecified: Secondary | ICD-10-CM | POA: Insufficient documentation

## 2018-01-23 DIAGNOSIS — Z885 Allergy status to narcotic agent status: Secondary | ICD-10-CM | POA: Insufficient documentation

## 2018-01-23 DIAGNOSIS — Z791 Long term (current) use of non-steroidal anti-inflammatories (NSAID): Secondary | ICD-10-CM | POA: Insufficient documentation

## 2018-01-23 DIAGNOSIS — Z7982 Long term (current) use of aspirin: Secondary | ICD-10-CM | POA: Diagnosis not present

## 2018-01-23 LAB — POCT GLYCOSYLATED HEMOGLOBIN (HGB A1C): HEMOGLOBIN A1C: 5.9 % — AB (ref 4.0–5.6)

## 2018-01-23 LAB — GLUCOSE, POCT (MANUAL RESULT ENTRY): POC Glucose: 114 mg/dl — AB (ref 70–99)

## 2018-01-23 MED ORDER — LISINOPRIL 5 MG PO TABS: 5.0000 mg | ORAL_TABLET | Freq: Every day | ORAL | 3 refills | Status: DC

## 2018-01-23 MED ORDER — FUROSEMIDE 40 MG PO TABS: 40.0000 mg | ORAL_TABLET | Freq: Every day | ORAL | 3 refills | Status: DC

## 2018-01-23 MED ORDER — ATORVASTATIN CALCIUM 80 MG PO TABS: ORAL_TABLET | ORAL | 3 refills | Status: DC

## 2018-01-23 MED ORDER — METFORMIN HCL 500 MG PO TABS: 500.0000 mg | ORAL_TABLET | Freq: Every day | ORAL | 3 refills | Status: DC

## 2018-01-23 NOTE — Telephone Encounter (Signed)
Will forward to nurse 

## 2018-01-23 NOTE — Progress Notes (Signed)
Subjective:  Patient ID: [name], male    DOB: May 19, 1961  Age: 57 y.o. MRN: [id number]  CC: Diabetes; Hypertension; and Establish Care   HPI [name] is a 57 year old male with a history of type 2 diabetes mellitus (A1c 5.7), hypertension, dyslipidemia, ASCVD, CAD status post CABG in 2017 who was previously followed by Dr Hyman Hopes and scheduled to establish care with a new PCP in 4 days however he called complaining of pedal edema and was scheduled for an acute visit today. He has been out of his Lasix and complains his legs have swollen ever since but he denies shortness of breath, orthopnea, weight gain.  Echo 12/2016: Study Conclusions  - Left ventricle: The cavity size was normal. Wall thickness was   increased in a pattern of mild LVH. There was severe concentric   hypertrophy. Systolic function was normal. The estimated ejection   fraction was in the range of 60% to 65%. Doppler parameters are   consistent with abnormal left ventricular relaxation (grade 1   diastolic dysfunction). The E/e&' ratio is between 8-15,   suggesting indeterminate LV filling pressure. - Aortic valve: Poorly visualized. A bioprosthesis is in place.   Transvalvular velocity was minimally increased. Mean gradient   (S): 9 mm Hg. Peak gradient (S): 19 mm Hg. - Mitral valve: Mildly thickened leaflets . There was trivial   regurgitation. - Left atrium: The atrium was normal in size. - Inferior vena cava: The vessel was normal in size. The   respirophasic diameter changes were in the normal range (>= 50%),   consistent with normal central venous pressure.  Impressions:  - Compared to a prior study in 2017, the LVEF and bioprosthetic   aortic valve gradients are stable. His blood pressure is also elevated and he has been out of his antihypertensive. His last visit to the clinic was in 04/2017 and he is upset because "no one called him to give him an appointment and no one called him to schedule  an appointment with his cardiologist like Dr. Hyman Hopes usually does" He informs me he has noticed intermittent flickering of his right chest wall muscles and is concerned since he has not been to see his cardiologist in a while. With regards to his diabetes mellitus he takes 500 mg of metformin daily and denies hypoglycemia.  Past Medical History:  Diagnosis Date  . Allergy   . Anemia   . Anxiety   . Baker's cyst of knee   . Blood transfusion without reported diagnosis    as baby   . Coronary artery disease    quadruple bypass - March 2016  . GERD (gastroesophageal reflux disease)   . Gouty arthritis    "real bad" (01/17/2013)  . Heart murmur   . Hypercholesteremia   . Hypertension   . Myocardial infarction (HCC) 2017  . Stroke (HCC)   . Type II diabetes mellitus (HCC)     Past Surgical History:  Procedure Laterality Date  . AORTIC VALVE REPLACEMENT N/A 11/25/2015   Procedure: AORTIC VALVE REPLACEMENT (AVR) USING A MAGNA EASE ;  Surgeon: Alleen Borne, MD;  Location: [facility] OR;  Service: Open Heart Surgery;  Laterality: N/A;  . CARDIAC CATHETERIZATION N/A 11/19/2015   Procedure: Right/Left Heart Cath and Coronary Angiography;  Surgeon: Lyn Records, MD;  Location: [facility] INVASIVE CV LAB;  Service: Cardiovascular;  Laterality: N/A;  . COLONOSCOPY  2004  . CORONARY ARTERY BYPASS GRAFT N/A 11/25/2015   Procedure: CORONARY ARTERY BYPASS  GRAFTING (CABG)x4 LIMA-LAD; SVG-OM; SVG-RCA; SVG-DIAG;  Surgeon: Alleen Borne, MD;  Location: [facility] OR;  Service: Open Heart Surgery;  Laterality: N/A;  . CYST REMOVAL TRUNK N/A 08/24/2016   Procedure: EXCISION OF BACK ABSCESS;  Surgeon: Jimmye Norman, MD;  Location: [facility];  Service: General;  Laterality: N/A;  . FINGER AMPUTATION Right 1980's   3rd & 4th digits "got them mashed" (01/17/2013)  . KNEE ARTHROSCOPY Right 1980's  . MULTIPLE EXTRACTIONS WITH ALVEOLOPLASTY Bilateral 11/21/2015   Procedure: Extraction of tooth #'s 2,12 with  alveoloplasty and gross debridement of remaining dentition;  Surgeon: Charlynne Pander, DDS;  Location[facility] OR;  Service: Oral Surgery;  Laterality: Bilateral;  . TEE WITHOUT CARDIOVERSION N/A 11/20/2015   Procedure: TRANSESOPHAGEAL ECHOCARDIOGRAM (TEE);  Surgeon: Laurey Morale, MD;  Location: [facility] ENDOSCOPY;  Service: Cardiovascular;  Laterality: N/A;  . TEE WITHOUT CARDIOVERSION N/A 11/25/2015   Procedure: TRANSESOPHAGEAL ECHOCARDIOGRAM (TEE);  Surgeon: Alleen Borne, MD;  Location: [facility] OR;  Service: Open Heart Surgery;  Laterality: N/A;    Allergies  Allergen Reactions  . Other Anaphylaxis    mushrooms  . Hydrocodone-Acetaminophen Itching     Outpatient Medications Prior to Visit  Medication Sig Dispense Refill  . allopurinol (ZYLOPRIM) 300 MG tablet Take 1 tablet (300 mg total) by mouth daily. 90 tablet 3  . aspirin EC 81 MG tablet Take 81 mg by mouth daily. Aspirin 81 mg  two times daily.    . fluticasone (FLONASE) 50 MCG/ACT nasal spray Place 2 sprays into both nostrils daily. 16 g 6  . folic acid (FOLVITE) 1 MG tablet Take 1 mg by mouth daily.    Marland Kitchen ibuprofen (ADVIL,MOTRIN) 600 MG tablet Take 1 tablet (600 mg total) by mouth every 8 (eight) hours as needed. 15 tablet 0  . loratadine (CLARITIN) 10 MG tablet Take 1 tablet (10 mg total) by mouth daily. 30 tablet 11  . metoprolol tartrate (LOPRESSOR) 25 MG tablet TAKE 1 TABLET BY MOUTH TWICE DAILY 180 tablet 3  . potassium chloride SA (KLOR-CON M20) 20 MEQ tablet Take 1 tablet (20 mEq total) by mouth daily. 90 tablet 3  . atorvastatin (LIPITOR) 80 MG tablet TAKE ONE TABLET BY MOUTH ONCE DAILY AT  6  PM 90 tablet 0  . furosemide (LASIX) 40 MG tablet Take 1 tablet (40 mg total) by mouth daily. 90 tablet 3  . lisinopril (PRINIVIL,ZESTRIL) 5 MG tablet Take 1 tablet (5 mg total) by mouth daily. 90 tablet 3  . metFORMIN (GLUCOPHAGE) 500 MG tablet Take 1 tablet (500 mg total) by mouth daily with breakfast. (Patient taking differently: Take 500  mg by mouth 2 (two) times daily with a meal. ) 90 tablet 3  . Castellani Paint 1.5 % LIQD Apply 1 application topically daily. (Patient not taking: Reported on 07/01/2017) 1 Bottle 1  . cyclobenzaprine (FLEXERIL) 10 MG tablet Take 1 tablet (10 mg total) by mouth 3 (three) times daily as needed for muscle spasms. (Patient not taking: Reported on 07/01/2017) 12 tablet 0  . Ferrous Sulfate (IRON) 325 (65 Fe) MG TABS TAKE 1 TABLET BY MOUTH ONCE DAILY WITH BREAKFAST (Patient not taking: Reported on 07/01/2017) 90 tablet 0   No facility-administered medications prior to visit.     ROS Review of Systems  Constitutional: Negative for activity change and appetite change.  HENT: Negative for sinus pressure and sore throat.   Eyes: Negative for visual disturbance.  Respiratory: Negative for cough, chest tightness and shortness  of breath.   Cardiovascular: Positive for leg swelling. Negative for chest pain.  Gastrointestinal: Negative for abdominal distention, abdominal pain, constipation and diarrhea.  Endocrine: Negative.   Genitourinary: Negative for dysuria.  Musculoskeletal: Negative for joint swelling and myalgias.  Skin: Negative for rash.  Allergic/Immunologic: Negative.   Neurological: Negative for weakness, light-headedness and numbness.  Psychiatric/Behavioral: Negative for dysphoric mood and suicidal ideas.    Objective:  BP (!) 170/110   Pulse 71   Temp 97.7 F (36.5 C) (Oral)   Ht 5\' 6"  (1.676 m)   Wt 237 lb 3.2 oz (107.6 kg)   SpO2 97%   BMI 38.29 kg/m   BP/Weight 01/23/2018 07/01/2017 05/04/2017  Systolic BP 170 - 135  Diastolic BP 110 - 97  Wt. (Lbs) 237.2 225 215.4  BMI 38.29 36.32 41.24      Physical Exam  Constitutional: He is oriented to person, place, and time. He appears well-developed and well-nourished.  Neck: No JVD present.  Cardiovascular: Normal rate and intact distal pulses.  Murmur (2/6 systolic) heard. Pulmonary/Chest: Effort normal and breath  sounds normal. He has no wheezes. He has no rales. He exhibits no tenderness.  Vertical midline sternotomy scar  Abdominal: Soft. Bowel sounds are normal. He exhibits no distension and no mass. There is no tenderness.  Musculoskeletal: Normal range of motion.  Neurological: He is alert and oriented to person, place, and time.  Skin: Skin is warm and dry.  Psychiatric: He has a normal mood and affect.    Lab Results  Component Value Date   HGBA1C 5.9 (A) 01/23/2018    Assessment & Plan:   1. Type 2 diabetes mellitus without complication, without long-term current use of insulin (HCC) Controlled with A1c of 5.9  Continue current regimen, diabetic diet and lifestyle modifications - POCT glucose (manual entry) - POCT glycosylated hemoglobin (Hb A1C) - lisinopril (PRINIVIL,ZESTRIL) 5 MG tablet; Take 1 tablet (5 mg total) by mouth daily.  Dispense: 30 tablet; Refill: 3 - metFORMIN (GLUCOPHAGE) 500 MG tablet; Take 1 tablet (500 mg total) by mouth daily with breakfast.  Dispense: 30 tablet; Refill: 3  2. Dyslipidemia He is due for a lipid panel and comprehensive metabolic panel Advised to come in fasting at his visit with his PCP for labs - atorvastatin (LIPITOR) 80 MG tablet; TAKE ONE TABLET BY MOUTH ONCE DAILY AT  6  PM  Dispense: 30 tablet; Refill: 3  3. Essential hypertension Uncontrolled due to running out of medications which I have refilled Also refilled Lasix which he has been out of and could explain his pedal edema - furosemide (LASIX) 40 MG tablet; Take 1 tablet (40 mg total) by mouth daily.  Dispense: 30 tablet; Refill: 3  4. H/O heart bypass surgery He does complain of right-sided chest muscle spasm but no chest pain EKG performed today reveals borderline right bundle branch block He has not been to see cardiology in a while; will leave to discretion of PCP. Risk factor modifications  Meds ordered this encounter  Medications  . atorvastatin (LIPITOR) 80 MG tablet     Sig: TAKE ONE TABLET BY MOUTH ONCE DAILY AT  6  PM    Dispense:  30 tablet    Refill:  3    Please consider 90 day supplies to promote better adherence  . furosemide (LASIX) 40 MG tablet    Sig: Take 1 tablet (40 mg total) by mouth daily.    Dispense:  30 tablet    Refill:  3  . lisinopril (PRINIVIL,ZESTRIL) 5 MG tablet    Sig: Take 1 tablet (5 mg total) by mouth daily.    Dispense:  30 tablet    Refill:  3  . metFORMIN (GLUCOPHAGE) 500 MG tablet    Sig: Take 1 tablet (500 mg total) by mouth daily with breakfast.    Dispense:  30 tablet    Refill:  3    Follow-up: Return for Follow-up of chronic medical conditions and to establish care, keep previously scheduled appointment.   Hoy Register MD

## 2018-01-23 NOTE — Telephone Encounter (Signed)
Patient called requesting a refill on his BP medication. Patient states that he is completely out of medication and his appt. Is not until 01/27/18. Patient uses Psychologist, forensic on [employer]. Please f/u

## 2018-01-27 ENCOUNTER — Encounter: Payer: Self-pay | Admitting: Internal Medicine

## 2018-01-27 ENCOUNTER — Ambulatory Visit: Payer: PPO | Attending: Internal Medicine | Admitting: Internal Medicine

## 2018-01-27 DIAGNOSIS — K219 Gastro-esophageal reflux disease without esophagitis: Secondary | ICD-10-CM | POA: Diagnosis not present

## 2018-01-27 DIAGNOSIS — M868X9 Other osteomyelitis, unspecified sites: Secondary | ICD-10-CM | POA: Insufficient documentation

## 2018-01-27 DIAGNOSIS — Z791 Long term (current) use of non-steroidal anti-inflammatories (NSAID): Secondary | ICD-10-CM | POA: Insufficient documentation

## 2018-01-27 DIAGNOSIS — Z1211 Encounter for screening for malignant neoplasm of colon: Secondary | ICD-10-CM

## 2018-01-27 DIAGNOSIS — Z89021 Acquired absence of right finger(s): Secondary | ICD-10-CM | POA: Insufficient documentation

## 2018-01-27 DIAGNOSIS — Z952 Presence of prosthetic heart valve: Secondary | ICD-10-CM | POA: Diagnosis not present

## 2018-01-27 DIAGNOSIS — I10 Essential (primary) hypertension: Secondary | ICD-10-CM | POA: Diagnosis not present

## 2018-01-27 DIAGNOSIS — E119 Type 2 diabetes mellitus without complications: Secondary | ICD-10-CM | POA: Diagnosis not present

## 2018-01-27 DIAGNOSIS — E1121 Type 2 diabetes mellitus with diabetic nephropathy: Secondary | ICD-10-CM | POA: Diagnosis not present

## 2018-01-27 DIAGNOSIS — Q231 Congenital insufficiency of aortic valve: Secondary | ICD-10-CM | POA: Diagnosis not present

## 2018-01-27 DIAGNOSIS — J302 Other seasonal allergic rhinitis: Secondary | ICD-10-CM | POA: Diagnosis not present

## 2018-01-27 DIAGNOSIS — Z118 Encounter for screening for other infectious and parasitic diseases: Secondary | ICD-10-CM | POA: Diagnosis not present

## 2018-01-27 DIAGNOSIS — M1000 Idiopathic gout, unspecified site: Secondary | ICD-10-CM

## 2018-01-27 DIAGNOSIS — Z9889 Other specified postprocedural states: Secondary | ICD-10-CM | POA: Diagnosis not present

## 2018-01-27 DIAGNOSIS — Z8249 Family history of ischemic heart disease and other diseases of the circulatory system: Secondary | ICD-10-CM | POA: Insufficient documentation

## 2018-01-27 DIAGNOSIS — Z1159 Encounter for screening for other viral diseases: Secondary | ICD-10-CM

## 2018-01-27 DIAGNOSIS — R600 Localized edema: Secondary | ICD-10-CM | POA: Diagnosis not present

## 2018-01-27 DIAGNOSIS — Z885 Allergy status to narcotic agent status: Secondary | ICD-10-CM | POA: Diagnosis not present

## 2018-01-27 DIAGNOSIS — Z79899 Other long term (current) drug therapy: Secondary | ICD-10-CM | POA: Insufficient documentation

## 2018-01-27 DIAGNOSIS — Z91018 Allergy to other foods: Secondary | ICD-10-CM | POA: Insufficient documentation

## 2018-01-27 DIAGNOSIS — E1169 Type 2 diabetes mellitus with other specified complication: Secondary | ICD-10-CM | POA: Insufficient documentation

## 2018-01-27 DIAGNOSIS — B353 Tinea pedis: Secondary | ICD-10-CM

## 2018-01-27 DIAGNOSIS — I25810 Atherosclerosis of coronary artery bypass graft(s) without angina pectoris: Secondary | ICD-10-CM

## 2018-01-27 DIAGNOSIS — Z7982 Long term (current) use of aspirin: Secondary | ICD-10-CM | POA: Diagnosis not present

## 2018-01-27 DIAGNOSIS — J3089 Other allergic rhinitis: Secondary | ICD-10-CM

## 2018-01-27 DIAGNOSIS — E785 Hyperlipidemia, unspecified: Secondary | ICD-10-CM | POA: Insufficient documentation

## 2018-01-27 DIAGNOSIS — Z833 Family history of diabetes mellitus: Secondary | ICD-10-CM | POA: Insufficient documentation

## 2018-01-27 DIAGNOSIS — D649 Anemia, unspecified: Secondary | ICD-10-CM

## 2018-01-27 MED ORDER — FLUCONAZOLE 150 MG PO TABS: 150.0000 mg | ORAL_TABLET | Freq: Every day | ORAL | 0 refills | Status: DC

## 2018-01-27 MED ORDER — IRON 325 (65 FE) MG PO TABS: ORAL_TABLET | ORAL | 1 refills | Status: DC

## 2018-01-27 MED ORDER — LORATADINE 10 MG PO TABS: 10.0000 mg | ORAL_TABLET | Freq: Every day | ORAL | 11 refills | Status: DC

## 2018-01-27 MED ORDER — POTASSIUM CHLORIDE CRYS ER 20 MEQ PO TBCR: 20.0000 meq | EXTENDED_RELEASE_TABLET | Freq: Every day | ORAL | 3 refills | Status: DC

## 2018-01-27 MED ORDER — ALLOPURINOL 300 MG PO TABS: 300.0000 mg | ORAL_TABLET | Freq: Every day | ORAL | 3 refills | Status: DC

## 2018-01-27 MED ORDER — FLUTICASONE PROPIONATE 50 MCG/ACT NA SUSP: 2.0000 | Freq: Every day | NASAL | 6 refills | Status: DC

## 2018-01-27 NOTE — Patient Instructions (Signed)
Hold off on taking Atorvastatin for 1 week while taking the Fluconazole for your foot fungus.

## 2018-01-27 NOTE — Progress Notes (Signed)
Pt states he thinks he has gout in his ankle  Pt is needing medication refill but not the ones Dr. Alvis Lemmings refilled already

## 2018-01-27 NOTE — Progress Notes (Signed)
Patient ID: [name], male    DOB: 03/24/1961  MRN: [id number]  CC: re-establish; Diabetes; Hypertension; and Medication Refill   Subjective: [name] is a 57 y.o. male who presents to est with me as PCP.  Previous PCP was Dr. Hyman Hopes. His concerns today include:  DM, HTN, HL, CAD s/p CABG and AVR 2017, anemia  CAD/HTN/HL:  Saw Dr. Alvis Lemmings 4 days ago for med Rfs and LE edema.  He was restarted on Furosemide.  LE edema has decreased.  Loss 5 lbs.  K+ supplement on list pt pt states he is out of that as well.  Needs RF on Metoprolol, Lipitor -no CP/SOB/PND/ orthopnea.  Had fluttering RT side of chest few mths agoHe has not seen his cardiologist, Dr Melburn Popper, in over 1 yr  DM:  Checks BS QOD.  Reports good readings.  Compliant with Metformin No numbness in feet.  Sees his foot Dr. Dr. Ardelle Anton regularly. Has athlete's foot.  Rxn recently called in for a spray by his podiatrist but he has not picked up yet Lab Results  Component Value Date   HGBA1C 5.9 (A) 01/23/2018   Anemia:  Suppose to be on iron.  Needs RF.  Last H/H 10.4/30.8 10 mths ago  Fungus b/w toes.  [facility] Patient Active Problem List   Diagnosis Date Noted  . Colon cancer screening 05/04/2017  . Brodie's abscess of thoracic spine (HCC) 06/24/2016  . CVA (cerebral infarction) 12/11/2015  . Acute ischemic VBA thalamic stroke (HCC)   . Atherosclerosis of native coronary artery of native heart without angina pectoris   . H/O heart bypass surgery   . Benign essential HTN   . Type 2 diabetes mellitus without complication, without long-term current use of insulin (HCC)   . S/P AVR 11/25/2015  . CAD (coronary artery disease)   . Chronic periodontitis 11/21/2015  . Bicuspid aortic valve   . AKI (acute kidney injury) (HCC)   . Controlled type 2 diabetes mellitus with diabetic nephropathy, without long-term current use of insulin (HCC)   . Essential hypertension 02/24/2015  . Dyslipidemia 02/24/2015  . GERD  (gastroesophageal reflux disease) 05/02/2013  . Primary gout 05/02/2013  . Allergic rhinitis due to allergen 07/09/2010     Current Outpatient Medications on File Prior to Visit  Medication Sig Dispense Refill  . aspirin EC 81 MG tablet Take 81 mg by mouth daily. Aspirin 81 mg  two times daily.    Marland Kitchen atorvastatin (LIPITOR) 80 MG tablet TAKE ONE TABLET BY MOUTH ONCE DAILY AT  6  PM 30 tablet 3  . Castellani Paint 1.5 % LIQD Apply 1 application topically daily. (Patient not taking: Reported on 07/01/2017) 1 Bottle 1  . cyclobenzaprine (FLEXERIL) 10 MG tablet Take 1 tablet (10 mg total) by mouth 3 (three) times daily as needed for muscle spasms. (Patient not taking: Reported on 07/01/2017) 12 tablet 0  . folic acid (FOLVITE) 1 MG tablet Take 1 mg by mouth daily.    . furosemide (LASIX) 40 MG tablet Take 1 tablet (40 mg total) by mouth daily. 30 tablet 3  . ibuprofen (ADVIL,MOTRIN) 600 MG tablet Take 1 tablet (600 mg total) by mouth every 8 (eight) hours as needed. 15 tablet 0  . lisinopril (PRINIVIL,ZESTRIL) 5 MG tablet Take 1 tablet (5 mg total) by mouth daily. 30 tablet 3  . metFORMIN (GLUCOPHAGE) 500 MG tablet Take 1 tablet (500 mg total) by mouth daily with breakfast. 30 tablet 3  .  metoprolol tartrate (LOPRESSOR) 25 MG tablet TAKE 1 TABLET BY MOUTH TWICE DAILY 180 tablet 3   No current facility-administered medications on file prior to visit.     Allergies  Allergen Reactions  . Other Anaphylaxis    mushrooms  . Hydrocodone-Acetaminophen Itching    Social History   Socioeconomic History  . Marital status: Married    Spouse name: Not on file  . Number of children: Not on file  . Years of education: Not on file  . Highest education level: Not on file  Occupational History  . Not on file  Social Needs  . Financial resource strain: Not on file  . Food insecurity:    Worry: Not on file    Inability: Not on file  . Transportation needs:    Medical: Not on file    Non-medical:  Not on file  Tobacco Use  . Smoking status: Never Smoker  . Smokeless tobacco: Never Used  Substance and Sexual Activity  . Alcohol use: Yes    Comment: Drinks beer once a week  . Drug use: Yes    Types: Marijuana    Comment: last smoked weed 2 months ago  . Sexual activity: Yes  Lifestyle  . Physical activity:    Days per week: Not on file    Minutes per session: Not on file  . Stress: Not on file  Relationships  . Social connections:    Talks on phone: Not on file    Gets together: Not on file    Attends religious service: Not on file    Active member of club or organization: Not on file    Attends meetings of clubs or organizations: Not on file    Relationship status: Not on file  . Intimate partner violence:    Fear of current or ex partner: Not on file    Emotionally abused: Not on file    Physically abused: Not on file    Forced sexual activity: Not on file  Other Topics Concern  . Not on file  Social History Narrative  . Not on file    Family History  Problem Relation Age of Onset  . Diabetes Father   . Hypertension Father   . Hypertension Mother   . Hypertension Sister   . Hypertension Brother   . Hypertension Brother   . Hypertension Brother   . Colon cancer Neg Hx   . Esophageal cancer Neg Hx   . Rectal cancer Neg Hx   . Stomach cancer Neg Hx   . Colon polyps Neg Hx     Past Surgical History:  Procedure Laterality Date  . AORTIC VALVE REPLACEMENT N/A 11/25/2015   Procedure: AORTIC VALVE REPLACEMENT (AVR) USING A MAGNA EASE ;  Surgeon: Alleen Borne, MD;  Location: [facility] OR;  Service: Open Heart Surgery;  Laterality: N/A;  . CARDIAC CATHETERIZATION N/A 11/19/2015   Procedure: Right/Left Heart Cath and Coronary Angiography;  Surgeon: Lyn Records, MD;  Location: [facility] INVASIVE CV LAB;  Service: Cardiovascular;  Laterality: N/A;  . COLONOSCOPY  2004  . CORONARY ARTERY BYPASS GRAFT N/A 11/25/2015   Procedure: CORONARY ARTERY BYPASS GRAFTING (CABG)x4  LIMA-LAD; SVG-OM; SVG-RCA; SVG-DIAG;  Surgeon: Alleen Borne, MD;  Location: [facility] OR;  Service: Open Heart Surgery;  Laterality: N/A;  . CYST REMOVAL TRUNK N/A 08/24/2016   Procedure: EXCISION OF BACK ABSCESS;  Surgeon: Jimmye Norman, MD;  Location: [facility];  Service: General;  Laterality: N/A;  .  FINGER AMPUTATION Right 1980's   3rd & 4th digits "got them mashed" (01/17/2013)  . KNEE ARTHROSCOPY Right 1980's  . MULTIPLE EXTRACTIONS WITH ALVEOLOPLASTY Bilateral 11/21/2015   Procedure: Extraction of tooth #'s 2,12 with alveoloplasty and gross debridement of remaining dentition;  Surgeon: Charlynne Pander, DDS;  Location: [facility] OR;  Service: Oral Surgery;  Laterality: Bilateral;  . TEE WITHOUT CARDIOVERSION N/A 11/20/2015   Procedure: TRANSESOPHAGEAL ECHOCARDIOGRAM (TEE);  Surgeon: Laurey Morale, MD;  Location: [facility] ENDOSCOPY;  Service: Cardiovascular;  Laterality: N/A;  . TEE WITHOUT CARDIOVERSION N/A 11/25/2015   Procedure: TRANSESOPHAGEAL ECHOCARDIOGRAM (TEE);  Surgeon: Alleen Borne, MD;  Location: [facility] OR;  Service: Open Heart Surgery;  Laterality: N/A;    ROS: Review of Systems Intermittent soreness RT ankle which he attributes to got.  Taking Allopurinol  PHYSICAL EXAM: BP (!) 151/107   Pulse 86   Temp 97.7 F (36.5 C) (Oral)   Resp 16   Ht 5\' 6"  (1.676 m)   Wt 232 lb (105.2 kg)   SpO2 96%   BMI 37.45 kg/m   Wt Readings from Last 3 Encounters:  01/27/18 232 lb (105.2 kg)  01/23/18 237 lb 3.2 oz (107.6 kg)  07/01/17 225 lb (102.1 kg)    Physical Exam  General appearance - alert, well appearing, and in no distress Mental status - normal mood, behavior, speech, dress, motor activity, and thought processes Neck - supple, no significant adenopathy Chest - clear to auscultation, no wheezes, rales or rhonchi, symmetric air entry Heart -RRR, 2/6 SEM LU and RUSB Extremities -trace LE edema MSK:  RT ankle:  No edema or warmth.  Good ROM Diabetic Foot Exam - Simple     Simple Foot Form Visual Inspection See comments:  Yes Sensation Testing Intact to touch and monofilament testing bilaterally:  Yes Pulse Check Posterior Tibialis and Dorsalis pulse intact bilaterally:  Yes Comments Hypopigmentation with increase moisture b/w toes LT>RT.  Feet dry      Results for orders placed or performed in visit on 01/27/18  Comprehensive metabolic panel  Result Value Ref Range   Glucose 108 (H) 65 - 99 mg/dL   BUN 16 6 - 24 mg/dL   Creatinine, Ser 1.61 (H) 0.76 - 1.27 mg/dL   GFR calc non Af Amer 56 (L) >59 mL/min/1.73   GFR calc Af Amer 65 >59 mL/min/1.73   BUN/Creatinine Ratio 12 9 - 20   Sodium 144 134 - 144 mmol/L   Potassium 3.9 3.5 - 5.2 mmol/L   Chloride 108 (H) 96 - 106 mmol/L   CO2 19 (L) 20 - 29 mmol/L   Calcium 9.8 8.7 - 10.2 mg/dL   Total Protein 7.6 6.0 - 8.5 g/dL   Albumin 4.2 3.5 - 5.5 g/dL   Globulin, Total 3.4 1.5 - 4.5 g/dL   Albumin/Globulin Ratio 1.2 1.2 - 2.2   Bilirubin Total 0.4 0.0 - 1.2 mg/dL   Alkaline Phosphatase 102 39 - 117 IU/L   AST 25 0 - 40 IU/L   ALT 14 0 - 44 IU/L  CBC  Result Value Ref Range   WBC 8.6 3.4 - 10.8 x10E3/uL   RBC 4.67 4.14 - 5.80 x10E6/uL   Hemoglobin 14.1 13.0 - 17.7 g/dL   Hematocrit 09.6 04.5 - 51.0 %   MCV 93 79 - 97 fL   MCH 30.2 26.6 - 33.0 pg   MCHC 32.6 31.5 - 35.7 g/dL   RDW 40.9 (H) 81.1 - 91.4 %   Platelets 352  150 - 450 x10E3/uL  Lipid panel  Result Value Ref Range   Cholesterol, Total 221 (H) 100 - 199 mg/dL   Triglycerides 161 (H) 0 - 149 mg/dL   HDL 39 (L) >09 mg/dL   VLDL Cholesterol Cal 62 (H) 5 - 40 mg/dL   LDL Calculated 604 (H) 0 - 99 mg/dL   Chol/HDL Ratio 5.7 (H) 0.0 - 5.0 ratio  Hepatitis C Antibody  Result Value Ref Range   Hep C Virus Ab <0.1 0.0 - 0.9 s/co ratio  Iron, TIBC and Ferritin Panel  Result Value Ref Range   Total Iron Binding Capacity 358 250 - 450 ug/dL   UIBC 540 981 - 191 ug/dL   Iron 93 38 - 478 ug/dL   Iron Saturation 26 15 - 55 %   Ferritin  144 30 - 400 ng/mL    ASSESSMENT AND PLAN: 1. Coronary artery disease involving coronary bypass graft of native heart without angina pectoris 2. Essential hypertension -pt already has RF on Lisinopril and Metoprolol given by Dr. Alvis Lemmings.  Advised to pick up ASAP.  Continue Lipitor - Ambulatory referral to Cardiology - potassium chloride SA (KLOR-CON M20) 20 MEQ tablet; Take 1 tablet (20 mEq total) by mouth daily.  Dispense: 90 tablet; Refill: 3  3. Type 2 diabetes mellitus without complication, without long-term current use of insulin (HCC) At goal.  Continue Metformin - Comprehensive metabolic panel - CBC - Lipid panel  4. Tinea pedis of both feet Advised to keep feet dry and clean - fluconazole (DIFLUCAN) 150 MG tablet; Take 1 tablet (150 mg total) by mouth daily.  Dispense: 7 tablet; Refill: 0  5. Anemia, unspecified type - Iron, TIBC and Ferritin Panel  6. Idiopathic gout, unspecified chronicity, unspecified site - allopurinol (ZYLOPRIM) 300 MG tablet; Take 1 tablet (300 mg total) by mouth daily.  Dispense: 90 tablet; Refill: 3  7. Seasonal allergic rhinitis due to other allergic trigger - loratadine (CLARITIN) 10 MG tablet; Take 1 tablet (10 mg total) by mouth daily.  Dispense: 30 tablet; Refill: 11 - fluticasone (FLONASE) 50 MCG/ACT nasal spray; Place 2 sprays into both nostrils daily.  Dispense: 16 g; Refill: 6  8. Colon cancer screening Refer for colonoscopy. - Ferrous Sulfate (IRON) 325 (65 Fe) MG TABS; TAKE 1 TABLET BY MOUTH ONCE DAILY WITH BREAKFAST  Dispense: 90 tablet; Refill: 1  9. Need for hepatitis C screening test - Hepatitis C Antibody  Patient was given the opportunity to ask questions.  Patient verbalized understanding of the plan and was able to repeat key elements of the plan.   Orders Placed This Encounter  Procedures  . Comprehensive metabolic panel  . CBC  . Lipid panel  . Hepatitis C Antibody  . Iron, TIBC and Ferritin Panel  . Ambulatory  referral to Cardiology     Requested Prescriptions   Signed Prescriptions Disp Refills  . allopurinol (ZYLOPRIM) 300 MG tablet 90 tablet 3    Sig: Take 1 tablet (300 mg total) by mouth daily.  Marland Kitchen loratadine (CLARITIN) 10 MG tablet 30 tablet 11    Sig: Take 1 tablet (10 mg total) by mouth daily.  . fluticasone (FLONASE) 50 MCG/ACT nasal spray 16 g 6    Sig: Place 2 sprays into both nostrils daily.  . Ferrous Sulfate (IRON) 325 (65 Fe) MG TABS 90 tablet 1    Sig: TAKE 1 TABLET BY MOUTH ONCE DAILY WITH BREAKFAST  . potassium chloride SA (KLOR-CON M20) 20 MEQ tablet 90  tablet 3    Sig: Take 1 tablet (20 mEq total) by mouth daily.  . fluconazole (DIFLUCAN) 150 MG tablet 7 tablet 0    Sig: Take 1 tablet (150 mg total) by mouth daily.    Return in about 3 months (around 04/29/2018).  Jonah Blue, MD, FACP

## 2018-01-28 LAB — COMPREHENSIVE METABOLIC PANEL
A/G RATIO: 1.2 (ref 1.2–2.2)
ALT: 14 IU/L (ref 0–44)
AST: 25 IU/L (ref 0–40)
Albumin: 4.2 g/dL (ref 3.5–5.5)
Alkaline Phosphatase: 102 IU/L (ref 39–117)
BUN/Creatinine Ratio: 12 (ref 9–20)
BUN: 16 mg/dL (ref 6–24)
Bilirubin Total: 0.4 mg/dL (ref 0.0–1.2)
CHLORIDE: 108 mmol/L — AB (ref 96–106)
CO2: 19 mmol/L — ABNORMAL LOW (ref 20–29)
Calcium: 9.8 mg/dL (ref 8.7–10.2)
Creatinine, Ser: 1.38 mg/dL — ABNORMAL HIGH (ref 0.76–1.27)
GFR calc non Af Amer: 56 mL/min/{1.73_m2} — ABNORMAL LOW (ref >59)
GFR, EST AFRICAN AMERICAN: 65 mL/min/{1.73_m2} (ref >59)
GLOBULIN, TOTAL: 3.4 g/dL (ref 1.5–4.5)
Glucose: 108 mg/dL — ABNORMAL HIGH (ref 65–99)
Potassium: 3.9 mmol/L (ref 3.5–5.2)
SODIUM: 144 mmol/L (ref 134–144)
TOTAL PROTEIN: 7.6 g/dL (ref 6.0–8.5)

## 2018-01-28 LAB — HEPATITIS C ANTIBODY: Hep C Virus Ab: <0.1 s/co ratio (ref 0.0–0.9)

## 2018-01-28 LAB — IRON,TIBC AND FERRITIN PANEL
FERRITIN: 144 ng/mL (ref 30–400)
IRON SATURATION: 26 % (ref 15–55)
IRON: 93 ug/dL (ref 38–169)
TIBC: 358 ug/dL (ref 250–450)
UIBC: 265 ug/dL (ref 111–343)

## 2018-01-28 LAB — LIPID PANEL
Chol/HDL Ratio: 5.7 ratio — ABNORMAL HIGH (ref 0.0–5.0)
Cholesterol, Total: 221 mg/dL — ABNORMAL HIGH (ref 100–199)
HDL: 39 mg/dL — ABNORMAL LOW (ref >39)
LDL CALC: 120 mg/dL — AB (ref 0–99)
TRIGLYCERIDES: 312 mg/dL — AB (ref 0–149)
VLDL Cholesterol Cal: 62 mg/dL — ABNORMAL HIGH (ref 5–40)

## 2018-01-28 LAB — CBC
HEMOGLOBIN: 14.1 g/dL (ref 13.0–17.7)
Hematocrit: 43.2 % (ref 37.5–51.0)
MCH: 30.2 pg (ref 26.6–33.0)
MCHC: 32.6 g/dL (ref 31.5–35.7)
MCV: 93 fL (ref 79–97)
PLATELETS: 352 10*3/uL (ref 150–450)
RBC: 4.67 x10E6/uL (ref 4.14–5.80)
RDW: 16.1 % — ABNORMAL HIGH (ref 12.3–15.4)
WBC: 8.6 10*3/uL (ref 3.4–10.8)

## 2018-02-01 ENCOUNTER — Telehealth: Payer: Self-pay

## 2018-02-01 ENCOUNTER — Other Ambulatory Visit: Payer: Self-pay

## 2018-02-01 MED ORDER — PNEUMOCOCCAL VAC POLYVALENT 25 MCG/0.5ML IJ INJ: 0.5000 mL | INJECTION | INTRAMUSCULAR | 0 refills | Status: AC

## 2018-02-01 MED ORDER — TETANUS-DIPHTH-ACELL PERTUSSIS 5-2.5-18.5 LF-MCG/0.5 IM SUSP: 0.5000 mL | Freq: Once | INTRAMUSCULAR | 0 refills | Status: AC

## 2018-02-01 NOTE — Telephone Encounter (Signed)
Contacted pt to go over lab results pt is aware and doesn't have any questions or concerns 

## 2018-03-14 ENCOUNTER — Other Ambulatory Visit: Payer: Self-pay | Admitting: Internal Medicine

## 2018-03-14 DIAGNOSIS — Z1211 Encounter for screening for malignant neoplasm of colon: Secondary | ICD-10-CM

## 2018-03-15 ENCOUNTER — Other Ambulatory Visit: Payer: Self-pay | Admitting: Internal Medicine

## 2018-03-16 ENCOUNTER — Encounter: Payer: Self-pay | Admitting: Nurse Practitioner

## 2018-03-22 ENCOUNTER — Ambulatory Visit: Payer: Self-pay | Admitting: Nurse Practitioner

## 2018-04-04 ENCOUNTER — Ambulatory Visit: Payer: Self-pay | Admitting: Nurse Practitioner

## 2018-04-10 ENCOUNTER — Inpatient Hospital Stay (HOSPITAL_COMMUNITY): Payer: PPO

## 2018-04-10 ENCOUNTER — Encounter (HOSPITAL_COMMUNITY): Payer: Self-pay

## 2018-04-10 ENCOUNTER — Emergency Department (HOSPITAL_COMMUNITY): Payer: PPO

## 2018-04-10 ENCOUNTER — Inpatient Hospital Stay (HOSPITAL_COMMUNITY)
Admission: EM | Admit: 2018-04-10 | Discharge: 2018-04-12 | DRG: 069 | Disposition: A | Discharge status: Home / Self Care | Payer: PPO | Attending: Internal Medicine | Admitting: Internal Medicine

## 2018-04-10 ENCOUNTER — Other Ambulatory Visit: Payer: Self-pay

## 2018-04-10 DIAGNOSIS — I252 Old myocardial infarction: Secondary | ICD-10-CM | POA: Diagnosis not present

## 2018-04-10 DIAGNOSIS — I10 Essential (primary) hypertension: Secondary | ICD-10-CM

## 2018-04-10 DIAGNOSIS — H538 Other visual disturbances: Secondary | ICD-10-CM | POA: Diagnosis present

## 2018-04-10 DIAGNOSIS — M109 Gout, unspecified: Secondary | ICD-10-CM | POA: Diagnosis not present

## 2018-04-10 DIAGNOSIS — Z833 Family history of diabetes mellitus: Secondary | ICD-10-CM

## 2018-04-10 DIAGNOSIS — E860 Dehydration: Secondary | ICD-10-CM | POA: Diagnosis present

## 2018-04-10 DIAGNOSIS — R011 Cardiac murmur, unspecified: Secondary | ICD-10-CM | POA: Diagnosis not present

## 2018-04-10 DIAGNOSIS — Z8673 Personal history of transient ischemic attack (TIA), and cerebral infarction without residual deficits: Secondary | ICD-10-CM

## 2018-04-10 DIAGNOSIS — E785 Hyperlipidemia, unspecified: Secondary | ICD-10-CM | POA: Diagnosis not present

## 2018-04-10 DIAGNOSIS — N179 Acute kidney failure, unspecified: Secondary | ICD-10-CM | POA: Diagnosis not present

## 2018-04-10 DIAGNOSIS — E1122 Type 2 diabetes mellitus with diabetic chronic kidney disease: Secondary | ICD-10-CM | POA: Diagnosis not present

## 2018-04-10 DIAGNOSIS — I2510 Atherosclerotic heart disease of native coronary artery without angina pectoris: Secondary | ICD-10-CM | POA: Diagnosis present

## 2018-04-10 DIAGNOSIS — R4701 Aphasia: Secondary | ICD-10-CM | POA: Diagnosis present

## 2018-04-10 DIAGNOSIS — I672 Cerebral atherosclerosis: Secondary | ICD-10-CM | POA: Diagnosis not present

## 2018-04-10 DIAGNOSIS — I5032 Chronic diastolic (congestive) heart failure: Secondary | ICD-10-CM | POA: Diagnosis present

## 2018-04-10 DIAGNOSIS — K219 Gastro-esophageal reflux disease without esophagitis: Secondary | ICD-10-CM | POA: Diagnosis present

## 2018-04-10 DIAGNOSIS — Z953 Presence of xenogenic heart valve: Secondary | ICD-10-CM

## 2018-04-10 DIAGNOSIS — R402362 Coma scale, best motor response, obeys commands, at arrival to emergency department: Secondary | ICD-10-CM | POA: Diagnosis present

## 2018-04-10 DIAGNOSIS — K8020 Calculus of gallbladder without cholecystitis without obstruction: Secondary | ICD-10-CM | POA: Diagnosis not present

## 2018-04-10 DIAGNOSIS — N182 Chronic kidney disease, stage 2 (mild): Secondary | ICD-10-CM | POA: Diagnosis present

## 2018-04-10 DIAGNOSIS — Z951 Presence of aortocoronary bypass graft: Secondary | ICD-10-CM | POA: Diagnosis not present

## 2018-04-10 DIAGNOSIS — R29700 NIHSS score 0: Secondary | ICD-10-CM | POA: Diagnosis present

## 2018-04-10 DIAGNOSIS — G245 Blepharospasm: Secondary | ICD-10-CM | POA: Diagnosis present

## 2018-04-10 DIAGNOSIS — Z79899 Other long term (current) drug therapy: Secondary | ICD-10-CM

## 2018-04-10 DIAGNOSIS — I130 Hypertensive heart and chronic kidney disease with heart failure and stage 1 through stage 4 chronic kidney disease, or unspecified chronic kidney disease: Secondary | ICD-10-CM | POA: Diagnosis not present

## 2018-04-10 DIAGNOSIS — Z95810 Presence of automatic (implantable) cardiac defibrillator: Secondary | ICD-10-CM | POA: Diagnosis not present

## 2018-04-10 DIAGNOSIS — R402142 Coma scale, eyes open, spontaneous, at arrival to emergency department: Secondary | ICD-10-CM | POA: Diagnosis present

## 2018-04-10 DIAGNOSIS — Z91018 Allergy to other foods: Secondary | ICD-10-CM | POA: Diagnosis not present

## 2018-04-10 DIAGNOSIS — I6603 Occlusion and stenosis of bilateral middle cerebral arteries: Secondary | ICD-10-CM | POA: Diagnosis present

## 2018-04-10 DIAGNOSIS — Z7982 Long term (current) use of aspirin: Secondary | ICD-10-CM

## 2018-04-10 DIAGNOSIS — R479 Unspecified speech disturbances: Secondary | ICD-10-CM | POA: Diagnosis not present

## 2018-04-10 DIAGNOSIS — R471 Dysarthria and anarthria: Secondary | ICD-10-CM | POA: Diagnosis not present

## 2018-04-10 DIAGNOSIS — J309 Allergic rhinitis, unspecified: Secondary | ICD-10-CM | POA: Diagnosis present

## 2018-04-10 DIAGNOSIS — E7800 Pure hypercholesterolemia, unspecified: Secondary | ICD-10-CM | POA: Diagnosis present

## 2018-04-10 DIAGNOSIS — E669 Obesity, unspecified: Secondary | ICD-10-CM | POA: Diagnosis present

## 2018-04-10 DIAGNOSIS — G459 Transient cerebral ischemic attack, unspecified: Principal | ICD-10-CM | POA: Diagnosis present

## 2018-04-10 DIAGNOSIS — R29810 Facial weakness: Secondary | ICD-10-CM | POA: Diagnosis not present

## 2018-04-10 DIAGNOSIS — R55 Syncope and collapse: Secondary | ICD-10-CM | POA: Diagnosis present

## 2018-04-10 DIAGNOSIS — E119 Type 2 diabetes mellitus without complications: Secondary | ICD-10-CM | POA: Diagnosis not present

## 2018-04-10 DIAGNOSIS — J984 Other disorders of lung: Secondary | ICD-10-CM | POA: Diagnosis present

## 2018-04-10 DIAGNOSIS — Z8249 Family history of ischemic heart disease and other diseases of the circulatory system: Secondary | ICD-10-CM

## 2018-04-10 DIAGNOSIS — R4781 Slurred speech: Secondary | ICD-10-CM | POA: Diagnosis not present

## 2018-04-10 DIAGNOSIS — I5030 Unspecified diastolic (congestive) heart failure: Secondary | ICD-10-CM | POA: Diagnosis not present

## 2018-04-10 DIAGNOSIS — Z6835 Body mass index (BMI) 35.0-35.9, adult: Secondary | ICD-10-CM

## 2018-04-10 DIAGNOSIS — Z7984 Long term (current) use of oral hypoglycemic drugs: Secondary | ICD-10-CM

## 2018-04-10 DIAGNOSIS — Z885 Allergy status to narcotic agent status: Secondary | ICD-10-CM | POA: Diagnosis not present

## 2018-04-10 DIAGNOSIS — R402252 Coma scale, best verbal response, oriented, at arrival to emergency department: Secondary | ICD-10-CM | POA: Diagnosis present

## 2018-04-10 DIAGNOSIS — Z791 Long term (current) use of non-steroidal anti-inflammatories (NSAID): Secondary | ICD-10-CM

## 2018-04-10 DIAGNOSIS — Z89021 Acquired absence of right finger(s): Secondary | ICD-10-CM

## 2018-04-10 LAB — COMPREHENSIVE METABOLIC PANEL
ALBUMIN: 4.1 g/dL (ref 3.5–5.0)
ALT: 12 U/L (ref 0–44)
ANION GAP: 10 (ref 5–15)
AST: 26 U/L (ref 15–41)
Alkaline Phosphatase: 102 U/L (ref 38–126)
BUN: 44 mg/dL — ABNORMAL HIGH (ref 6–20)
CALCIUM: 9.3 mg/dL (ref 8.9–10.3)
CO2: 18 mmol/L — AB (ref 22–32)
Chloride: 111 mmol/L (ref 98–111)
Creatinine, Ser: 1.74 mg/dL — ABNORMAL HIGH (ref 0.61–1.24)
GFR calc Af Amer: 48 mL/min — ABNORMAL LOW (ref >60)
GFR calc non Af Amer: 41 mL/min — ABNORMAL LOW (ref >60)
GLUCOSE: 112 mg/dL — AB (ref 70–99)
Potassium: 4.7 mmol/L (ref 3.5–5.1)
SODIUM: 139 mmol/L (ref 135–145)
Total Bilirubin: 0.4 mg/dL (ref 0.3–1.2)
Total Protein: 7.6 g/dL (ref 6.5–8.1)

## 2018-04-10 LAB — CBC
HCT: 39.8 % (ref 39.0–52.0)
Hemoglobin: 13.5 g/dL (ref 13.0–17.0)
MCH: 30.6 pg (ref 26.0–34.0)
MCHC: 33.9 g/dL (ref 30.0–36.0)
MCV: 90.2 fL (ref 78.0–100.0)
PLATELETS: 283 10*3/uL (ref 150–400)
RBC: 4.41 MIL/uL (ref 4.22–5.81)
RDW: 14.7 % (ref 11.5–15.5)
WBC: 8.7 10*3/uL (ref 4.0–10.5)

## 2018-04-10 LAB — I-STAT CHEM 8, ED
BUN: 47 mg/dL — AB (ref 6–20)
CALCIUM ION: 1.24 mmol/L (ref 1.15–1.40)
CHLORIDE: 110 mmol/L (ref 98–111)
Creatinine, Ser: 1.7 mg/dL — ABNORMAL HIGH (ref 0.61–1.24)
Glucose, Bld: 110 mg/dL — ABNORMAL HIGH (ref 70–99)
HEMATOCRIT: 40 % (ref 39.0–52.0)
Hemoglobin: 13.6 g/dL (ref 13.0–17.0)
Potassium: 4.8 mmol/L (ref 3.5–5.1)
SODIUM: 137 mmol/L (ref 135–145)
TCO2: 20 mmol/L — ABNORMAL LOW (ref 22–32)

## 2018-04-10 LAB — DIFFERENTIAL
Basophils Absolute: 0 10*3/uL (ref 0.0–0.1)
Basophils Relative: 0 %
EOS PCT: 3 %
Eosinophils Absolute: 0.2 10*3/uL (ref 0.0–0.7)
LYMPHS PCT: 47 %
Lymphs Abs: 4.1 10*3/uL — ABNORMAL HIGH (ref 0.7–4.0)
Monocytes Absolute: 0.9 10*3/uL (ref 0.1–1.0)
Monocytes Relative: 10 %
NEUTROS ABS: 3.5 10*3/uL (ref 1.7–7.7)
NEUTROS PCT: 40 %

## 2018-04-10 LAB — I-STAT TROPONIN, ED: Troponin i, poc: 0.01 ng/mL (ref 0.00–0.08)

## 2018-04-10 LAB — PROTIME-INR
INR: 0.99
PROTHROMBIN TIME: 13 s (ref 11.4–15.2)

## 2018-04-10 LAB — APTT: aPTT: 32 seconds (ref 24–36)

## 2018-04-10 LAB — GLUCOSE, CAPILLARY: Glucose-Capillary: 105 mg/dL — ABNORMAL HIGH (ref 70–99)

## 2018-04-10 MED ORDER — LORATADINE 10 MG PO TABS
10.0000 mg | ORAL_TABLET | Freq: Every day | ORAL | Status: DC
  Administered 2018-04-11 – 2018-04-12 (×2): 10 mg via ORAL
  Filled 2018-04-10 (×2): qty 1

## 2018-04-10 MED ORDER — ACETAMINOPHEN 325 MG PO TABS: 650.0000 mg | ORAL_TABLET | ORAL | Status: DC | PRN

## 2018-04-10 MED ORDER — ENOXAPARIN SODIUM 30 MG/0.3ML ~~LOC~~ SOLN
30.0000 mg | SUBCUTANEOUS | Status: DC
  Administered 2018-04-10: 30 mg via SUBCUTANEOUS
  Filled 2018-04-10: qty 0.3

## 2018-04-10 MED ORDER — FLUTICASONE PROPIONATE 50 MCG/ACT NA SUSP
2.0000 | Freq: Every day | NASAL | Status: DC
  Administered 2018-04-11: 2 via NASAL
  Filled 2018-04-10: qty 16

## 2018-04-10 MED ORDER — METOPROLOL TARTRATE 25 MG PO TABS
25.0000 mg | ORAL_TABLET | Freq: Two times a day (BID) | ORAL | Status: DC
  Administered 2018-04-11 – 2018-04-12 (×4): 25 mg via ORAL
  Filled 2018-04-10 (×4): qty 1

## 2018-04-10 MED ORDER — STROKE: EARLY STAGES OF RECOVERY BOOK
Freq: Once | Status: AC
  Administered 2018-04-10: 21:00:00
  Filled 2018-04-10: qty 1

## 2018-04-10 MED ORDER — SODIUM CHLORIDE 0.9 % IV SOLN
INTRAVENOUS | Status: AC
  Administered 2018-04-10: 23:00:00 via INTRAVENOUS

## 2018-04-10 MED ORDER — ACETAMINOPHEN 160 MG/5ML PO SOLN: 650.0000 mg | ORAL | Status: DC | PRN

## 2018-04-10 MED ORDER — HYDRALAZINE HCL 20 MG/ML IJ SOLN
10.0000 mg | Freq: Once | INTRAMUSCULAR | Status: AC
  Administered 2018-04-11: 10 mg via INTRAVENOUS
  Filled 2018-04-10: qty 1

## 2018-04-10 MED ORDER — ACETAMINOPHEN 650 MG RE SUPP: 650.0000 mg | RECTAL | Status: DC | PRN

## 2018-04-10 MED ORDER — FOLIC ACID 1 MG PO TABS
1.0000 mg | ORAL_TABLET | Freq: Every day | ORAL | Status: DC
  Administered 2018-04-11 – 2018-04-12 (×2): 1 mg via ORAL
  Filled 2018-04-10 (×2): qty 1

## 2018-04-10 MED ORDER — ALLOPURINOL 100 MG PO TABS
100.0000 mg | ORAL_TABLET | Freq: Every day | ORAL | Status: DC
  Filled 2018-04-10 (×2): qty 1

## 2018-04-10 MED ORDER — ATORVASTATIN CALCIUM 80 MG PO TABS
80.0000 mg | ORAL_TABLET | Freq: Every day | ORAL | Status: DC
  Administered 2018-04-11: 80 mg via ORAL
  Filled 2018-04-10: qty 1

## 2018-04-10 MED ORDER — INSULIN ASPART 100 UNIT/ML ~~LOC~~ SOLN
0.0000 [IU] | Freq: Three times a day (TID) | SUBCUTANEOUS | Status: DC
  Administered 2018-04-11 (×2): 1 [IU] via SUBCUTANEOUS

## 2018-04-10 MED ORDER — INSULIN ASPART 100 UNIT/ML ~~LOC~~ SOLN: 0.0000 [IU] | Freq: Every day | SUBCUTANEOUS | Status: DC

## 2018-04-10 MED ORDER — ASPIRIN 300 MG RE SUPP: 300.0000 mg | Freq: Every day | RECTAL | Status: DC

## 2018-04-10 MED ORDER — ASPIRIN 325 MG PO TABS
325.0000 mg | ORAL_TABLET | Freq: Every day | ORAL | Status: DC
  Administered 2018-04-10 – 2018-04-12 (×3): 325 mg via ORAL
  Filled 2018-04-10 (×3): qty 1

## 2018-04-10 NOTE — ED Notes (Signed)
ED Provider at bedside. 

## 2018-04-10 NOTE — ED Provider Notes (Signed)
[facility]-EMERGENCY DEPT Provider Note   CSN: [id number] Arrival date & time: 04/10/18  1631     History   Chief Complaint Chief Complaint  Patient presents with  . Balance issues    HPI [name] is a 57 y.o. male.  He has significant cardiac disease and has had a prior stroke.  He presents today with symptoms that occurred around 410 this afternoon.  He states he was sitting on the porch and when he got up.  He was having trouble speaking and trouble with his balance.  His daughter says his right side of his face was drooped.  The symptoms lasted about 5 to 10 minutes and it now completely resolved.  He denies any headache blurry vision double vision chest pain shortness of breath.  No nausea no vomiting no diarrhea no urinary symptoms.  He currently denies any weakness numbness or balance issues.  He said he had a prior stroke about a year ago because the right side of his face to be twitchy.  He states he has no lasting effects from that prior stroke.  He also has had multiple cardiac valves and an MI.  He also has diabetes and hypertension.  The history is provided by the patient and a relative.  Cerebrovascular Accident  This is a new problem. The current episode started 1 to 2 hours ago. The problem has been resolved. Pertinent negatives include no chest pain, no abdominal pain, no headaches and no shortness of breath. Nothing aggravates the symptoms. The symptoms are relieved by rest. He has tried rest for the symptoms. The treatment provided significant relief.    Past Medical History:  Diagnosis Date  . Allergy   . Anemia   . Anxiety   . Baker's cyst of knee   . Blood transfusion without reported diagnosis    as baby   . Coronary artery disease    quadruple bypass - March 2016  . GERD (gastroesophageal reflux disease)   . Gouty arthritis    "real bad" (01/17/2013)  . Heart murmur   . Hypercholesteremia   . Hypertension   . Myocardial  infarction (HCC) 2017  . Stroke (HCC)   . Type II diabetes mellitus [facility])     Patient Active Problem List   Diagnosis Date Noted  . Colon cancer screening 05/04/2017  . Brodie's abscess of thoracic spine (HCC) 06/24/2016  . CVA (cerebral infarction) 12/11/2015  . Acute ischemic VBA thalamic stroke (HCC)   . Atherosclerosis of native coronary artery of native heart without angina pectoris   . H/O heart bypass surgery   . Benign essential HTN   . Type 2 diabetes mellitus without complication, without long-term current use of insulin (HCC)   . S/P AVR 11/25/2015  . CAD (coronary artery disease)   . Chronic periodontitis 11/21/2015  . Bicuspid aortic valve   . AKI (acute kidney injury) (HCC)   . Controlled type 2 diabetes mellitus with diabetic nephropathy, without long-term current use of insulin (HCC)   . Essential hypertension 02/24/2015  . Dyslipidemia 02/24/2015  . GERD (gastroesophageal reflux disease) 05/02/2013  . Primary gout 05/02/2013  . Allergic rhinitis due to allergen 07/09/2010    Past Surgical History:  Procedure Laterality Date  . AORTIC VALVE REPLACEMENT N/A 11/25/2015   Procedure: AORTIC VALVE REPLACEMENT (AVR) USING A MAGNA EASE ;  Surgeon: Alleen Borne, MD;  Location: [facility] OR;  Service: Open Heart Surgery;  Laterality: N/A;  . CARDIAC CATHETERIZATION  N/A 11/19/2015   Procedure: Right/Left Heart Cath and Coronary Angiography;  Surgeon: Lyn Records, MD;  Location: [facility] INVASIVE CV LAB;  Service: Cardiovascular;  Laterality: N/A;  . COLONOSCOPY  2004  . CORONARY ARTERY BYPASS GRAFT N/A 11/25/2015   Procedure: CORONARY ARTERY BYPASS GRAFTING (CABG)x4 LIMA-LAD; SVG-OM; SVG-RCA; SVG-DIAG;  Surgeon: Alleen Borne, MD;  Location: [facility] OR;  Service: Open Heart Surgery;  Laterality: N/A;  . CYST REMOVAL TRUNK N/A 08/24/2016   Procedure: EXCISION OF BACK ABSCESS;  Surgeon: Jimmye Norman, MD;  Location: [facility];  Service: General;  Laterality: N/A;  .  FINGER AMPUTATION Right 1980's   3rd & 4th digits "got them mashed" (01/17/2013)  . KNEE ARTHROSCOPY Right 1980's  . MULTIPLE EXTRACTIONS WITH ALVEOLOPLASTY Bilateral 11/21/2015   Procedure: Extraction of tooth #'s 2,12 with alveoloplasty and gross debridement of remaining dentition;  Surgeon: Charlynne Pander, DDS;  Location: [facility] OR;  Service: Oral Surgery;  Laterality: Bilateral;  . TEE WITHOUT CARDIOVERSION N/A 11/20/2015   Procedure: TRANSESOPHAGEAL ECHOCARDIOGRAM (TEE);  Surgeon: Laurey Morale, MD;  Location: [facility] ENDOSCOPY;  Service: Cardiovascular;  Laterality: N/A;  . TEE WITHOUT CARDIOVERSION N/A 11/25/2015   Procedure: TRANSESOPHAGEAL ECHOCARDIOGRAM (TEE);  Surgeon: Alleen Borne, MD;  Location: [facility] OR;  Service: Open Heart Surgery;  Laterality: N/A;        Home Medications    Prior to Admission medications   Medication Sig Start Date End Date Taking? Authorizing Provider  allopurinol (ZYLOPRIM) 300 MG tablet Take 1 tablet (300 mg total) by mouth daily. 01/27/18   Marcine Matar, MD  aspirin EC 81 MG tablet Take 81 mg by mouth daily. Aspirin 81 mg  two times daily.    [provider]  atorvastatin (LIPITOR) 80 MG tablet TAKE ONE TABLET BY MOUTH ONCE DAILY AT  6  PM 01/23/18   Hoy Register, MD  Robyne Askew Paint 1.5 % LIQD Apply 1 application topically daily. Patient not taking: Reported on 07/01/2017 04/25/17   Vivi Barrack, DPM  cyclobenzaprine (FLEXERIL) 10 MG tablet Take 1 tablet (10 mg total) by mouth 3 (three) times daily as needed for muscle spasms. Patient not taking: Reported on 07/01/2017 03/20/17   Azalia Bilis, MD  fluconazole (DIFLUCAN) 150 MG tablet Take 1 tablet (150 mg total) by mouth daily. 01/27/18   Marcine Matar, MD  fluticasone (FLONASE) 50 MCG/ACT nasal spray Place 2 sprays into both nostrils daily. 01/27/18   Marcine Matar, MD  folic acid (FOLVITE) 1 MG tablet TAKE 1 TABLET BY MOUTH ONCE DAILY 03/15/18   Marcine Matar, MD    furosemide (LASIX) 40 MG tablet Take 1 tablet (40 mg total) by mouth daily. 01/23/18   Hoy Register, MD  ibuprofen (ADVIL,MOTRIN) 600 MG tablet Take 1 tablet (600 mg total) by mouth every 8 (eight) hours as needed. 03/20/17   Azalia Bilis, MD  lisinopril (PRINIVIL,ZESTRIL) 5 MG tablet Take 1 tablet (5 mg total) by mouth daily. 01/23/18   Hoy Register, MD  loratadine (CLARITIN) 10 MG tablet Take 1 tablet (10 mg total) by mouth daily. 01/27/18   Marcine Matar, MD  metFORMIN (GLUCOPHAGE) 500 MG tablet Take 1 tablet (500 mg total) by mouth daily with breakfast. 01/23/18   Hoy Register, MD  metoprolol tartrate (LOPRESSOR) 25 MG tablet TAKE 1 TABLET BY MOUTH TWICE DAILY 01/23/18   Hoy Register, MD  potassium chloride SA (KLOR-CON M20) 20 MEQ tablet Take 1 tablet (20 mEq total) by mouth  daily. 01/27/18   Marcine Matar, MD    Family History Family History  Problem Relation Age of Onset  . Diabetes Father   . Hypertension Father   . Hypertension Mother   . Hypertension Sister   . Hypertension Brother   . Hypertension Brother   . Hypertension Brother   . Colon cancer Neg Hx   . Esophageal cancer Neg Hx   . Rectal cancer Neg Hx   . Stomach cancer Neg Hx   . Colon polyps Neg Hx     Social History Social History   Tobacco Use  . Smoking status: Never Smoker  . Smokeless tobacco: Never Used  Substance Use Topics  . Alcohol use: Yes    Comment: Drinks beer once a week  . Drug use: Yes    Types: Marijuana    Comment: last smoked weed 2 months ago     Allergies   Other and Hydrocodone-acetaminophen   Review of Systems Review of Systems  Constitutional: Negative for fever.  HENT: Negative for sore throat.   Eyes: Negative for visual disturbance.  Respiratory: Negative for shortness of breath.   Cardiovascular: Negative for chest pain.  Gastrointestinal: Negative for abdominal pain.  Genitourinary: Negative for dysuria.  Musculoskeletal: Positive for gait problem.   Skin: Negative for rash.  Neurological: Positive for speech difficulty. Negative for headaches.     Physical Exam Updated Vital Signs BP (!) 150/104 (BP Location: Left Arm)   Pulse 82   Temp 98.2 F (36.8 C) (Oral)   Resp 18   Ht 5\' 6"  (1.676 m)   Wt 99.8 kg (220 lb)   SpO2 98%   BMI 35.51 kg/m   Physical Exam  Constitutional: He is oriented to person, place, and time. He appears well-developed and well-nourished.  HENT:  Head: Normocephalic and atraumatic.  Eyes: Conjunctivae are normal.  Neck: Neck supple.  Cardiovascular: Normal rate and regular rhythm.  Murmur heard.  Systolic murmur is present with a grade of 4/6. Pulmonary/Chest: Effort normal and breath sounds normal. No respiratory distress.  Abdominal: Soft. There is no tenderness.  Musculoskeletal: He exhibits no edema.  Neurological: He is alert and oriented to person, place, and time. He has normal strength. No cranial nerve deficit or sensory deficit. He displays a negative Romberg sign. Gait normal. GCS eye subscore is 4. GCS verbal subscore is 5. GCS motor subscore is 6.  Skin: Skin is warm and dry.  Psychiatric: He has a normal mood and affect.  Nursing note and vitals reviewed.    ED Treatments / Results  Labs (all labs ordered are listed, but only abnormal results are displayed) Labs Reviewed  DIFFERENTIAL - Abnormal; Notable for the following components:      Result Value   Lymphs Abs 4.1 (*)    All other components within normal limits  I-STAT CHEM 8, ED - Abnormal; Notable for the following components:   BUN 47 (*)    Creatinine, Ser 1.70 (*)    Glucose, Bld 110 (*)    TCO2 20 (*)    All other components within normal limits  PROTIME-INR  APTT  CBC  COMPREHENSIVE METABOLIC PANEL  I-STAT TROPONIN, ED  CBG MONITORING, ED    EKG EKG Interpretation  Date/Time:  Monday April 10 2018 16:37:05 EDT Ventricular Rate:  82 PR Interval:    QRS Duration: 126 QT Interval:  388 QTC  Calculation: 454 R Axis:   30 Text Interpretation:  Sinus rhythm Right bundle branch  block similar to prior 5/19 Confirmed by Meridee Score [id number]) on 04/10/2018 5:01:57 PM   Radiology Dg Chest 2 View  Result Date: 04/10/2018 CLINICAL DATA:  TIA.  Loss of balance.  Slurred speech. EXAM: CHEST - 2 VIEW COMPARISON:  March 20, 2017 FINDINGS: A tubular density in the periphery of the left upper lobe correlates with a mucous filled dilated airway seen on the CT scan from March 20, 2017. The finding is more conspicuous on today's chest x-ray compared to the March 20, 2017 x-ray suggesting the possibility of mild interval worsening. No pneumothorax. The cardiomediastinal silhouette is stable. No other acute abnormalities. IMPRESSION: A tubular density in the left upper lung peripherally likely correlates with the mucus plugged dilated airway in this region on the CT scan from March 20, 2017. The finding is more prominent on today's chest x-ray than the chest x-ray from March 20, 2017 suggesting the possibility of mild worsening. No other acute abnormalities. Electronically Signed   By: Gerome Sam III M.D   On: 04/10/2018 18:36   Ct Head Wo Contrast  Result Date: 04/10/2018 CLINICAL DATA:  Dysarthria, trouble with balance and right-sided facial droop EXAM: CT HEAD WITHOUT CONTRAST TECHNIQUE: Contiguous axial images were obtained from the base of the skull through the vertex without intravenous contrast. COMPARISON:  CT 09/24/2003, MRI 12/11/2015 FINDINGS: Brain: Tiny chronic left thalamic lacunar infarct. The small bifrontal involutional changes of the brain, slightly advanced for age. No hydrocephalus. No acute intracranial hemorrhage, large vascular territory infarct, midline shift or edema. No intra-axial mass nor extra-axial fluid collections. Patent cisterns. Vascular: No hyperdense vessel sign. Atherosclerosis noted of the cavernous sinuses. Skull: Normal. Negative for fracture or focal lesion.  Sinuses/Orbits: No acute finding. Other: None IMPRESSION: 1. Mild bifrontal involutional changes of the brain slightly advanced in appearance for age. 2. No acute intracranial abnormality. 3. Tiny chronic left thalamic lacunar infarct. Electronically Signed   By: Tollie Eth M.D.   On: 04/10/2018 17:42   Mr Brain Wo Contrast  Result Date: 04/11/2018 CLINICAL DATA:  57 y/o M; transient episode of difficulty speaking, imbalance, right-sided facial droop. TIA, initial exam. EXAM: MRI HEAD WITHOUT CONTRAST MRA HEAD WITHOUT CONTRAST TECHNIQUE: Multiplanar, multiecho pulse sequences of the brain and surrounding structures were obtained without intravenous contrast. Angiographic images of the head were obtained using MRA technique without contrast. COMPARISON:  04/10/2018 CT head. 12/11/2015 CTA head and neck. 12/11/2015 MRI and MRA of the head. FINDINGS: MRI HEAD FINDINGS Brain: No acute infarction, hemorrhage, hydrocephalus, extra-axial collection or mass lesion. Few stable nonspecific T2 FLAIR hyperintensities in subcortical and periventricular white matter are compatible with mild chronic microvascular ischemic changes for age. Mild stable brain volume loss. Small chronic lacunar infarct within the left thalamus. Vascular: As below. Skull and upper cervical spine: Normal marrow signal. Sinuses/Orbits: Negative. Other: None. MRA HEAD FINDINGS Internal carotid arteries: Mild left cavernous and terminal ICA stenosis. Moderate right paraclinoid stenosis. Anterior cerebral arteries:  Moderate right proximal A2 stenosis. Middle cerebral arteries: Severe left M1 long segment of stenosis. Severe right M1 tandem segments of stenosis. Anterior communicating artery: Patent. Posterior communicating arteries:  Patent. Posterior cerebral arteries: Multiple areas of stenosis, moderate in the P2 segments bilaterally. Patent bilateral vertebral arteries and the basilar artery. Basilar artery:  Patent. Vertebral arteries:  Patent. No  additional area of high-grade stenosis, aneurysm, or vascular malformation identified. IMPRESSION: 1. No acute intracranial abnormality. 2. Stable mild chronic microvascular ischemic changes and volume loss of the brain. Small chronic  lacunar infarct in the left thalamus. 3. No large vessel occlusion of the anterior or posterior intracranial circulation. 4. Stable intracranial atherosclerosis with multiple areas of stenosis, severe in the bilateral proximal MCA. Electronically Signed   By: Mitzi Hansen M.D.   On: 04/11/2018 04:15   US Renal  Result Date: 04/10/2018 CLINICAL DATA:  Acute onset of renal failure. EXAM: RENAL / URINARY TRACT ULTRASOUND COMPLETE COMPARISON:  CT of the abdomen and pelvis performed 03/20/2017 FINDINGS: Right Kidney: Length: 11.2 cm. Echogenicity within normal limits. No mass or hydronephrosis visualized. Left Kidney: Length: 9.7 cm. Echogenicity within normal limits. No mass or hydronephrosis visualized. Bladder: Appears normal for degree of bladder distention. Bilateral ureteral jets are visualized. Incidental note is made of stones within the gallbladder. IMPRESSION: 1. No evidence of hydronephrosis. 2. Cholelithiasis incidentally noted. Gallbladder otherwise grossly unremarkable. Electronically Signed   By: Roanna Raider M.D.   On: 04/10/2018 21:33   Mr Maxine Glenn Head Wo Contrast  Result Date: 04/11/2018 CLINICAL DATA:  57 y/o M; transient episode of difficulty speaking, imbalance, right-sided facial droop. TIA, initial exam. EXAM: MRI HEAD WITHOUT CONTRAST MRA HEAD WITHOUT CONTRAST TECHNIQUE: Multiplanar, multiecho pulse sequences of the brain and surrounding structures were obtained without intravenous contrast. Angiographic images of the head were obtained using MRA technique without contrast. COMPARISON:  04/10/2018 CT head. 12/11/2015 CTA head and neck. 12/11/2015 MRI and MRA of the head. FINDINGS: MRI HEAD FINDINGS Brain: No acute infarction, hemorrhage,  hydrocephalus, extra-axial collection or mass lesion. Few stable nonspecific T2 FLAIR hyperintensities in subcortical and periventricular white matter are compatible with mild chronic microvascular ischemic changes for age. Mild stable brain volume loss. Small chronic lacunar infarct within the left thalamus. Vascular: As below. Skull and upper cervical spine: Normal marrow signal. Sinuses/Orbits: Negative. Other: None. MRA HEAD FINDINGS Internal carotid arteries: Mild left cavernous and terminal ICA stenosis. Moderate right paraclinoid stenosis. Anterior cerebral arteries:  Moderate right proximal A2 stenosis. Middle cerebral arteries: Severe left M1 long segment of stenosis. Severe right M1 tandem segments of stenosis. Anterior communicating artery: Patent. Posterior communicating arteries:  Patent. Posterior cerebral arteries: Multiple areas of stenosis, moderate in the P2 segments bilaterally. Patent bilateral vertebral arteries and the basilar artery. Basilar artery:  Patent. Vertebral arteries:  Patent. No additional area of high-grade stenosis, aneurysm, or vascular malformation identified. IMPRESSION: 1. No acute intracranial abnormality. 2. Stable mild chronic microvascular ischemic changes and volume loss of the brain. Small chronic lacunar infarct in the left thalamus. 3. No large vessel occlusion of the anterior or posterior intracranial circulation. 4. Stable intracranial atherosclerosis with multiple areas of stenosis, severe in the bilateral proximal MCA. Electronically Signed   By: Mitzi Hansen M.D.   On: 04/11/2018 04:15    Procedures Procedures (including critical care time)  Medications Ordered in ED Medications - No data to display   Initial Impression / Assessment and Plan / ED Course  I have reviewed the triage vital signs and the nursing notes.  Pertinent labs & imaging results that were available during my care of the patient were reviewed by me and considered in my  medical decision making (see chart for details).  Clinical Course as of Apr 11 1924  [facility] Apr 10, 2018  1719 Reviewing his prior notes he had a left thalamic infarct and has had a CABG and valve replacement.  Patient states he is got a defibrillator but I do not see any mention of this in any of his hospital notes.  I  also do not feel one on his chest.   [MB]  1813 Discussed with neurology consultant Dr. Amada Jupiter who recommends the patient be admitted over to [facility] to the hospital service and undergo further work-up.   [MB]  1818 Patient is agreeable to stay for admission.  Page and the internal medicine service for admission.   [MB]  1945 For same your mistake you have been in the patient to the internal medicine team.  They called me back and found that he would should be covered by the hospitalist.  I have put in a call to Dr. Selena Batten and reviewed the case with him.  He will arrange to get the patient over to [facility] for further work-up.   [MB]    Clinical Course User Index [MB] Terrilee Files, MD     Final Clinical Impressions(s) / ED Diagnoses   Final diagnoses:  TIA (transient ischemic attack)    ED Discharge Orders    None       Terrilee Files, MD 04/11/18 [phone]

## 2018-04-10 NOTE — H&P (Addendum)
TRH H&P   Patient Demographics:    [name], is a 57 y.o. male  MRN: [id number]   DOB - 05/11/1961  Admit Date - 04/10/2018  Outpatient Primary MD for the patient is Marcine Matar, MD  Referring MD/NP/PA:    Meridee Score  Outpatient Specialists:     Patient coming from: home  Chief Complaint  Patient presents with  . Balance issues      HPI:    [name]  is a 57 y.o. male, w hypertension, hyperlipidemia, dm2, CAD s/p CABG  CVA (without residual deficit), had dysarthria , slurred speech and right facial droop and difficulty with gait stability according to his daughter starting about 4pm.  Lasting about 5-10 minutes.  Pt was brought to ED for evaluation. Per ED, neurology consulted and recommended transfer to [facility].   In Ed.   Na 137, K 4.8, Bun 47, Creatinine 1.7 INR 0.99 PTT 32 Wbc 8.7, Hgb 13.5, Plt 283 Trop 0.01  EKG nsr at 80, nl axis, nl pr, early R progression , T inversion 1, avl  CT brain  IMPRESSION: 1. Mild bifrontal involutional changes of the brain slightly advanced in appearance for age. 2. No acute intracranial abnormality. 3. Tiny chronic left thalamic lacunar infarct.  CXR IMPRESSION: A tubular density in the left upper lung peripherally likely correlates with the mucus plugged dilated airway in this region on the CT scan from March 20, 2017. The finding is more prominent on today's chest x-ray than the chest x-ray from March 20, 2017 suggesting the possibility of mild worsening. No other acute abnormalities.   Pt will be admitted for TIA r/o CVA as well as ARF.     Review of systems:    In addition to the HPI above, No Fever-chills, No Headache, No changes with Vision or hearing, No problems swallowing food or Liquids, No Chest pain, Cough or Shortness of Breath, No Abdominal pain, No Nausea or Vommitting, Bowel movements  are regular, No Blood in stool or Urine, No dysuria, No new skin rashes or bruises, No new joints pains-aches,  No new weakness, tingling, numbness in any extremity, No recent weight gain or loss, No polyuria, polydypsia or polyphagia, No significant Mental Stressors.  A full 10 point Review of Systems was done, except as stated above, all other Review of Systems were negative.   With Past History of the following :    Past Medical History:  Diagnosis Date  . Allergy   . Anemia   . Anxiety   . Baker's cyst of knee   . Blood transfusion without reported diagnosis    as baby   . Coronary artery disease    quadruple bypass - March 2016  . GERD (gastroesophageal reflux disease)   . Gouty arthritis    "real bad" (01/17/2013)  . Heart murmur   . Hypercholesteremia   .  Hypertension   . Myocardial infarction (HCC) 2017  . Stroke (HCC)   . Type II diabetes mellitus (HCC)       Past Surgical History:  Procedure Laterality Date  . AORTIC VALVE REPLACEMENT N/A 11/25/2015   Procedure: AORTIC VALVE REPLACEMENT (AVR) USING A MAGNA EASE ;  Surgeon: Alleen Borne, MD;  Location: [facility] OR;  Service: Open Heart Surgery;  Laterality: N/A;  . CARDIAC CATHETERIZATION N/A 11/19/2015   Procedure: Right/Left Heart Cath and Coronary Angiography;  Surgeon: Lyn Records, MD;  Location: [facility] INVASIVE CV LAB;  Service: Cardiovascular;  Laterality: N/A;  . COLONOSCOPY  2004  . CORONARY ARTERY BYPASS GRAFT N/A 11/25/2015   Procedure: CORONARY ARTERY BYPASS GRAFTING (CABG)x4 LIMA-LAD; SVG-OM; SVG-RCA; SVG-DIAG;  Surgeon: Alleen Borne, MD;  Location: [facility] OR;  Service: Open Heart Surgery;  Laterality: N/A;  . CYST REMOVAL TRUNK N/A 08/24/2016   Procedure: EXCISION OF BACK ABSCESS;  Surgeon: Jimmye Norman, MD;  Location: [facility];  Service: General;  Laterality: N/A;  . FINGER AMPUTATION Right 1980's   3rd & 4th digits "got them mashed" (01/17/2013)  . KNEE ARTHROSCOPY Right 1980's  .  MULTIPLE EXTRACTIONS WITH ALVEOLOPLASTY Bilateral 11/21/2015   Procedure: Extraction of tooth #'s 2,12 with alveoloplasty and gross debridement of remaining dentition;  Surgeon: Charlynne Pander, DDS;  Location: [facility] OR;  Service: Oral Surgery;  Laterality: Bilateral;  . TEE WITHOUT CARDIOVERSION N/A 11/20/2015   Procedure: TRANSESOPHAGEAL ECHOCARDIOGRAM (TEE);  Surgeon: Laurey Morale, MD;  Location: [facility] ENDOSCOPY;  Service: Cardiovascular;  Laterality: N/A;  . TEE WITHOUT CARDIOVERSION N/A 11/25/2015   Procedure: TRANSESOPHAGEAL ECHOCARDIOGRAM (TEE);  Surgeon: Alleen Borne, MD;  Location: [facility] OR;  Service: Open Heart Surgery;  Laterality: N/A;      Social History:     Social History   Tobacco Use  . Smoking status: Never Smoker  . Smokeless tobacco: Never Used  Substance Use Topics  . Alcohol use: Yes    Comment: Drinks beer once a week     Lives - at home  Mobility - walks by self   Family History :     Family History  Problem Relation Age of Onset  . Diabetes Father   . Hypertension Father   . Hypertension Mother   . Hypertension Sister   . Hypertension Brother   . Hypertension Brother   . Hypertension Brother   . Colon cancer Neg Hx   . Esophageal cancer Neg Hx   . Rectal cancer Neg Hx   . Stomach cancer Neg Hx   . Colon polyps Neg Hx        Home Medications:   Prior to Admission medications   Medication Sig Start Date End Date Taking? Authorizing Provider  allopurinol (ZYLOPRIM) 300 MG tablet Take 1 tablet (300 mg total) by mouth daily. 01/27/18  Yes Marcine Matar, MD  aspirin EC 81 MG tablet Take 81 mg by mouth daily. Aspirin 81 mg  two times daily.   Yes [provider]  atorvastatin (LIPITOR) 80 MG tablet TAKE ONE TABLET BY MOUTH ONCE DAILY AT  6  PM 01/23/18  Yes Newlin, Enobong, MD  fluticasone (FLONASE) 50 MCG/ACT nasal spray Place 2 sprays into both nostrils daily. 01/27/18  Yes Marcine Matar, MD  folic acid (FOLVITE) 1 MG tablet TAKE 1  TABLET BY MOUTH ONCE DAILY 03/15/18  Yes Marcine Matar, MD  furosemide (LASIX) 40 MG tablet Take 1 tablet (40 mg  total) by mouth daily. 01/23/18  Yes Hoy Register, MD  ibuprofen (ADVIL,MOTRIN) 600 MG tablet Take 1 tablet (600 mg total) by mouth every 8 (eight) hours as needed. 03/20/17  Yes Azalia Bilis, MD  lisinopril (PRINIVIL,ZESTRIL) 5 MG tablet Take 1 tablet (5 mg total) by mouth daily. 01/23/18  Yes Hoy Register, MD  loratadine (CLARITIN) 10 MG tablet Take 1 tablet (10 mg total) by mouth daily. 01/27/18  Yes Marcine Matar, MD  metFORMIN (GLUCOPHAGE) 500 MG tablet Take 1 tablet (500 mg total) by mouth daily with breakfast. 01/23/18  Yes Newlin, Enobong, MD  metoprolol tartrate (LOPRESSOR) 25 MG tablet TAKE 1 TABLET BY MOUTH TWICE DAILY 01/23/18  Yes Newlin, Enobong, MD  potassium chloride SA (KLOR-CON M20) 20 MEQ tablet Take 1 tablet (20 mEq total) by mouth daily. 01/27/18  Yes Marcine Matar, MD  Robyne Askew Paint 1.5 % LIQD Apply 1 application topically daily. Patient not taking: Reported on 07/01/2017 04/25/17   Vivi Barrack, DPM  cyclobenzaprine (FLEXERIL) 10 MG tablet Take 1 tablet (10 mg total) by mouth 3 (three) times daily as needed for muscle spasms. Patient not taking: Reported on 07/01/2017 03/20/17   Azalia Bilis, MD  fluconazole (DIFLUCAN) 150 MG tablet Take 1 tablet (150 mg total) by mouth daily. Patient not taking: Reported on 04/10/2018 01/27/18   Marcine Matar, MD     Allergies:     Allergies  Allergen Reactions  . Other Anaphylaxis    mushrooms  . Hydrocodone-Acetaminophen Itching     Physical Exam:   Vitals  Blood pressure 117/90, pulse 77, temperature 98.1 F (36.7 C), resp. rate 18, height 5\' 6"  (1.676 m), weight 220 lb (99.8 kg), SpO2 100 %.   1. General  lying in bed in NAD,   2. Normal affect and insight, Not Suicidal or Homicidal, Awake Alert, Oriented X 3.  3. No F.N deficits, ALL C.Nerves Intact, Strength 5/5 all 4 extremities,  Sensation intact all 4 extremities, Plantars down going.  4. Ears and Eyes appear Normal, Conjunctivae clear, PERRLA. Moist Oral Mucosa.  5. Supple Neck, No JVD, No cervical lymphadenopathy appriciated, No Carotid Bruits.  6. Symmetrical Chest wall movement, Good air movement bilaterally, CTAB.  7. RRR, No Gallops, Rubs or Murmurs, No Parasternal Heave.  8. Positive Bowel Sounds, Abdomen Soft, No tenderness, No organomegaly appriciated,No rebound -guarding or rigidity.  9.  No Cyanosis, Normal Skin Turgor, No Skin Rash or Bruise.  10. Good muscle tone,  joints appear normal , no effusions, Normal ROM.  11. No Palpable Lymph Nodes in Neck or Axillae   No pronator drift   Data Review:    CBC Recent Labs  Lab 04/10/18 1648 04/10/18 1655  WBC 8.7  --   HGB 13.5 13.6  HCT 39.8 40.0  PLT 283  --   MCV 90.2  --   MCH 30.6  --   MCHC 33.9  --   RDW 14.7  --   LYMPHSABS 4.1*  --   MONOABS 0.9  --   EOSABS 0.2  --   BASOSABS 0.0  --    ------------------------------------------------------------------------------------------------------------------  Chemistries  Recent Labs  Lab 04/10/18 1648 04/10/18 1655  NA 139 137  K 4.7 4.8  CL 111 110  CO2 18*  --   GLUCOSE 112* 110*  BUN 44* 47*  CREATININE 1.74* 1.70*  CALCIUM 9.3  --   AST 26  --   ALT 12  --   ALKPHOS 102  --  BILITOT 0.4  --    ------------------------------------------------------------------------------------------------------------------ estimated creatinine clearance is 52.4 mL/min (A) (by C-G formula based on SCr of 1.7 mg/dL (H)). ------------------------------------------------------------------------------------------------------------------ No results for input(s): TSH, T4TOTAL, T3FREE, THYROIDAB in the last 72 hours.  Invalid input(s): FREET3  Coagulation profile Recent Labs  Lab 04/10/18 1648  INR 0.99    ------------------------------------------------------------------------------------------------------------------- No results for input(s): DDIMER in the last 72 hours. -------------------------------------------------------------------------------------------------------------------  Cardiac Enzymes No results for input(s): CKMB, TROPONINI, MYOGLOBIN in the last 168 hours.  Invalid input(s): CK ------------------------------------------------------------------------------------------------------------------    Component Value Date/Time   BNP 1,075.7 (H) 11/15/2015 0853     ---------------------------------------------------------------------------------------------------------------  Urinalysis    Component Value Date/Time   COLORURINE YELLOW 11/25/2015 0120   APPEARANCEUR CLEAR 11/25/2015 0120   LABSPEC 1.019 11/25/2015 0120   PHURINE 6.5 11/25/2015 0120   GLUCOSEU NEGATIVE 11/25/2015 0120   HGBUR NEGATIVE 11/25/2015 0120   HGBUR negative 04/09/2010 0814   BILIRUBINUR NEGATIVE 11/25/2015 0120   KETONESUR NEGATIVE 11/25/2015 0120   PROTEINUR >300 (A) 11/25/2015 0120   UROBILINOGEN 0.2 02/24/2015 1601   NITRITE NEGATIVE 11/25/2015 0120   LEUKOCYTESUR TRACE (A) 11/25/2015 0120    ----------------------------------------------------------------------------------------------------------------   Imaging Results:    Dg Chest 2 View  Result Date: 04/10/2018 CLINICAL DATA:  TIA.  Loss of balance.  Slurred speech. EXAM: CHEST - 2 VIEW COMPARISON:  March 20, 2017 FINDINGS: A tubular density in the periphery of the left upper lobe correlates with a mucous filled dilated airway seen on the CT scan from March 20, 2017. The finding is more conspicuous on today's chest x-ray compared to the March 20, 2017 x-ray suggesting the possibility of mild interval worsening. No pneumothorax. The cardiomediastinal silhouette is stable. No other acute abnormalities. IMPRESSION: A tubular density in  the left upper lung peripherally likely correlates with the mucus plugged dilated airway in this region on the CT scan from March 20, 2017. The finding is more prominent on today's chest x-ray than the chest x-ray from March 20, 2017 suggesting the possibility of mild worsening. No other acute abnormalities. Electronically Signed   By: Gerome Sam III M.D   On: 04/10/2018 18:36   Ct Head Wo Contrast  Result Date: 04/10/2018 CLINICAL DATA:  Dysarthria, trouble with balance and right-sided facial droop EXAM: CT HEAD WITHOUT CONTRAST TECHNIQUE: Contiguous axial images were obtained from the base of the skull through the vertex without intravenous contrast. COMPARISON:  CT 09/24/2003, MRI 12/11/2015 FINDINGS: Brain: Tiny chronic left thalamic lacunar infarct. The small bifrontal involutional changes of the brain, slightly advanced for age. No hydrocephalus. No acute intracranial hemorrhage, large vascular territory infarct, midline shift or edema. No intra-axial mass nor extra-axial fluid collections. Patent cisterns. Vascular: No hyperdense vessel sign. Atherosclerosis noted of the cavernous sinuses. Skull: Normal. Negative for fracture or focal lesion. Sinuses/Orbits: No acute finding. Other: None IMPRESSION: 1. Mild bifrontal involutional changes of the brain slightly advanced in appearance for age. 2. No acute intracranial abnormality. 3. Tiny chronic left thalamic lacunar infarct. Electronically Signed   By: Tollie Eth M.D.   On: 04/10/2018 17:42       Assessment & Plan:    Principal Problem:   TIA (transient ischemic attack) Active Problems:   Benign essential HTN   Type 2 diabetes mellitus without complication, without long-term current use of insulin (HCC)   ARF (acute renal failure) (HCC)    TIA Check hga1c, lipid Check MRI brain Check carotid ultrasound Check cardiac echo PT/ OT to evaluate and tx  Speech to evaluate and tx Neurology consulted by ED  ARF STOP Lisinopril STOP  Furosemide STOP Ibuprofen STOP Metformin  Renal ultrasound Check cpk Hydrate with ns iv  Dm2 NO METFORMIN due to ARF fsbs ac and qhs, ISS  CAD s/p CABG Cont aspirin Cont Lipitor  Gout Decrease Allopurinol to 100mg  po qday   DVT Prophylaxis Lovenox - SCDs  AM Labs Ordered, also please review Full Orders  Family Communication: Admission, patients condition and plan of care including tests being ordered have been discussed with the patient  who indicate understanding and agree with the plan and Code Status.  Code Status  FULL CODE  Likely DC to  home  Condition GUARDED    Consults called: neurology by ED  Admission status: inpatient  Time spent in minutes : 60   Pearson Grippe M.D on 04/10/2018 at 7:50 PM  Between 7am to 7pm - Pager - [phone]  After 7pm go to [email or web address] - password [facility]  [facility] - Office  [phone]

## 2018-04-10 NOTE — Progress Notes (Signed)
ED TO INPATIENT HANDOFF REPORT  Name/Age/Gender [name] 57 y.o. male  Code Status Code Status History    Date Active Date Inactive Code Status Order ID Comments User Context   12/11/2015 0716 12/12/2015 2221 Full Code [id number]  [name], MD Inpatient   11/15/2015 1631 11/25/2015 1707 Full Code [id number]  [name], MD ED   01/17/2013 1001 01/19/2013 1328 Full Code [id number]  [name], MD ED      Home/SNF/Other Home  Chief Complaint trouble speaking / loss of balance   Level of Care/Admitting Diagnosis ED Disposition    ED Disposition Condition [place]: [place] [100100]  Level of Care: Telemetry [5]  Diagnosis: TIA (transient ischemic attack) [623762]  Admitting Physician: [name] [[id number]]  Attending Physician: [name] [id number]  Estimated length of stay: past midnight tomorrow  Certification:: I certify this patient will need inpatient services for at least 2 midnights  PT Class (Do Not Modify): Inpatient [101]  PT Acc Code (Do Not Modify): Private [1]       Medical History Past Medical History:  Diagnosis Date  . Allergy   . Anemia   . Anxiety   . Baker's cyst of knee   . Blood transfusion without reported diagnosis    as baby   . Coronary artery disease    quadruple bypass - March 2016  . GERD (gastroesophageal reflux disease)   . Gouty arthritis    "real bad" (01/17/2013)  . Heart murmur   . Hypercholesteremia   . Hypertension   . Myocardial infarction ([place]) 2017  . Stroke ([place])   . Type II diabetes mellitus (HCC)     Allergies Allergies  Allergen Reactions  . Other Anaphylaxis    mushrooms  . Hydrocodone-Acetaminophen Itching    IV Location/Drains/Wounds Patient Lines/Drains/Airways Status   Active Line/Drains/Airways    Name:   Placement date:   Placement time:   Site:   Days:   Peripheral IV 04/10/18 Left Hand   04/10/18    1933    Hand   less than 1   Incision (Closed)  08/24/16 Back Other (Comment)   08/24/16    1534     594          Labs/Imaging Results for orders placed or performed during the hospital encounter of 04/10/18 (from the past 48 hour(s))  Protime-INR     Status: None   Collection Time: 04/10/18  4:48 PM  Result Value Ref Range   Prothrombin Time 13.0 11.4 - 15.2 seconds   INR 0.99     Comment: Performed at [facility], [place] [address] [place], [place] [address]  APTT     Status: None   Collection Time: 04/10/18  4:48 PM  Result Value Ref Range   aPTT 32 24 - 36 seconds    Comment: Performed at [facility], [place] [address] [place], [place] [address]  CBC     Status: None   Collection Time: 04/10/18  4:48 PM  Result Value Ref Range   WBC 8.7 4.0 - 10.5 K/uL   RBC 4.41 4.22 - 5.81 MIL/uL   Hemoglobin 13.5 13.0 - 17.0 g/dL   HCT 39.8 39.0 - 52.0 %   MCV 90.2 78.0 - 100.0 fL   MCH 30.6 26.0 - 34.0 pg   MCHC 33.9 30.0 - 36.0 g/dL   RDW 14.7 11.5 - 15.5 %   Platelets 283 150 -  400 K/uL    Comment: Performed at [facility], [place] [address], [place], [place] [address]  Differential     Status: Abnormal   Collection Time: 04/10/18  4:48 PM  Result Value Ref Range   Neutrophils Relative % 40 %   Neutro Abs 3.5 1.7 - 7.7 K/uL   Lymphocytes Relative 47 %   Lymphs Abs 4.1 (H) 0.7 - 4.0 K/uL   Monocytes Relative 10 %   Monocytes Absolute 0.9 0.1 - 1.0 K/uL   Eosinophils Relative 3 %   Eosinophils Absolute 0.2 0.0 - 0.7 K/uL   Basophils Relative 0 %   Basophils Absolute 0.0 0.0 - 0.1 K/uL    Comment: Performed at [facility], [place] [address], [place], [place] [address]  Comprehensive metabolic panel     Status: Abnormal   Collection Time: 04/10/18  4:48 PM  Result Value Ref Range   Sodium 139 135 - 145 mmol/L   Potassium 4.7 3.5 - 5.1 mmol/L   Chloride 111 98 - 111 mmol/L   CO2 18 (L) 22 - 32 mmol/L   Glucose, Bld 112 (H) 70 - 99 mg/dL   BUN 44 (H) 6 -  20 mg/dL   Creatinine, Ser 1.74 (H) 0.61 - 1.24 mg/dL   Calcium 9.3 8.9 - 10.3 mg/dL   Total Protein 7.6 6.5 - 8.1 g/dL   Albumin 4.1 3.5 - 5.0 g/dL   AST 26 15 - 41 U/L   ALT 12 0 - 44 U/L   Alkaline Phosphatase 102 38 - 126 U/L   Total Bilirubin 0.4 0.3 - 1.2 mg/dL   GFR calc non Af Amer 41 (L) >60 mL/min   GFR calc Af Amer 48 (L) >60 mL/min    Comment: (NOTE) The eGFR has been calculated using the CKD EPI equation. This calculation has not been validated in all clinical situations. eGFR's persistently <60 mL/min signify possible Chronic Kidney Disease.    Anion gap 10 5 - 15    Comment: Performed at [facility], [place] [address] [place], [place] [address]  I-stat troponin, ED     Status: None   Collection Time: 04/10/18  4:52 PM  Result Value Ref Range   Troponin i, poc 0.01 0.00 - 0.08 ng/mL   Comment 3            Comment: Due to the release kinetics of cTnI, a negative result within the first hours of the onset of symptoms does not rule out myocardial infarction with certainty. If myocardial infarction is still suspected, repeat the test at appropriate intervals.   I-Stat Chem 8, ED     Status: Abnormal   Collection Time: 04/10/18  4:55 PM  Result Value Ref Range   Sodium 137 135 - 145 mmol/L   Potassium 4.8 3.5 - 5.1 mmol/L   Chloride 110 98 - 111 mmol/L   BUN 47 (H) 6 - 20 mg/dL   Creatinine, Ser 1.70 (H) 0.61 - 1.24 mg/dL   Glucose, Bld 110 (H) 70 - 99 mg/dL   Calcium, Ion 1.24 1.15 - 1.40 mmol/L   TCO2 20 (L) 22 - 32 mmol/L   Hemoglobin 13.6 13.0 - 17.0 g/dL   HCT 40.0 39.0 - 52.0 %   Dg Chest 2 View  Result Date: 04/10/2018 CLINICAL DATA:  TIA.  Loss of balance.  Slurred speech. EXAM: CHEST - 2 VIEW COMPARISON:  March 20, 2017 FINDINGS: A tubular density in the periphery of the  left upper lobe correlates with a mucous filled dilated airway seen on the CT scan from March 20, 2017. The finding is more conspicuous on today's chest x-ray compared  to the March 20, 2017 x-ray suggesting the possibility of mild interval worsening. No pneumothorax. The cardiomediastinal silhouette is stable. No other acute abnormalities. IMPRESSION: A tubular density in the left upper lung peripherally likely correlates with the mucus plugged dilated airway in this region on the CT scan from March 20, 2017. The finding is more prominent on today's chest x-ray than the chest x-ray from March 20, 2017 suggesting the possibility of mild worsening. No other acute abnormalities. Electronically Signed   By: Dorise Bullion III M.D   On: 04/10/2018 18:36   Ct Head Wo Contrast  Result Date: 04/10/2018 CLINICAL DATA:  Dysarthria, trouble with balance and right-sided facial droop EXAM: CT HEAD WITHOUT CONTRAST TECHNIQUE: Contiguous axial images were obtained from the base of the skull through the vertex without intravenous contrast. COMPARISON:  CT 09/24/2003, MRI 12/11/2015 FINDINGS: Brain: Tiny chronic left thalamic lacunar infarct. The small bifrontal involutional changes of the brain, slightly advanced for age. No hydrocephalus. No acute intracranial hemorrhage, large vascular territory infarct, midline shift or edema. No intra-axial mass nor extra-axial fluid collections. Patent cisterns. Vascular: No hyperdense vessel sign. Atherosclerosis noted of the cavernous sinuses. Skull: Normal. Negative for fracture or focal lesion. Sinuses/Orbits: No acute finding. Other: None IMPRESSION: 1. Mild bifrontal involutional changes of the brain slightly advanced in appearance for age. 2. No acute intracranial abnormality. 3. Tiny chronic left thalamic lacunar infarct. Electronically Signed   By: Ashley Royalty M.D.   On: 04/10/2018 17:42    Pending Labs Unresulted Labs (From admission, onward)   None      Vitals/Pain Today's Vitals   04/10/18 1757 04/10/18 1757 04/10/18 1806 04/10/18 1859  BP: (!) 116/94   117/90  Pulse: 79   77  Resp: 20   18  Temp:   98.1 F (36.7 C)   TempSrc:       SpO2: 100%   100%  Weight:      Height:      PainSc:  0-No pain      Isolation Precautions No active isolations  Medications Medications - No data to display  Mobility walks

## 2018-04-10 NOTE — ED Notes (Signed)
CONVERSATIONS WITH DAUGHTER AND PATIENT ASKING IF HE HAD BEEN DRINKING. PT REPLIED NO. THIS WRITER ASKED OF PATIENT DRANK DAILY PT REPLIED NO ONLY 1-2 BEERS .

## 2018-04-10 NOTE — ED Notes (Signed)
Attempted to call report

## 2018-04-10 NOTE — ED Notes (Signed)
Report given to receiving RN and [facility].

## 2018-04-10 NOTE — ED Triage Notes (Signed)
Pt presents with c/o loss of balance and slurred speech per daughter that has now resolved. Daughter reports that she was asking him questions that he was unable to answer and that he was stumbling and unable to maintain his balance. Daughter also reports that he had a facial droop. None of these symptoms are noted at this time.

## 2018-04-11 ENCOUNTER — Inpatient Hospital Stay (HOSPITAL_COMMUNITY): Payer: Self-pay

## 2018-04-11 ENCOUNTER — Inpatient Hospital Stay (HOSPITAL_COMMUNITY): Payer: PPO

## 2018-04-11 DIAGNOSIS — G459 Transient cerebral ischemic attack, unspecified: Principal | ICD-10-CM

## 2018-04-11 DIAGNOSIS — I10 Essential (primary) hypertension: Secondary | ICD-10-CM

## 2018-04-11 DIAGNOSIS — I5030 Unspecified diastolic (congestive) heart failure: Secondary | ICD-10-CM

## 2018-04-11 LAB — ANTITHROMBIN III: AntiThromb III Func: 94 % (ref 75–120)

## 2018-04-11 LAB — GLUCOSE, CAPILLARY
GLUCOSE-CAPILLARY: 123 mg/dL — AB (ref 70–99)
GLUCOSE-CAPILLARY: 127 mg/dL — AB (ref 70–99)
Glucose-Capillary: 155 mg/dL — ABNORMAL HIGH (ref 70–99)
Glucose-Capillary: 121 mg/dL — ABNORMAL HIGH (ref 70–99)

## 2018-04-11 MED ORDER — ENOXAPARIN SODIUM 40 MG/0.4ML ~~LOC~~ SOLN
40.0000 mg | SUBCUTANEOUS | Status: DC
  Administered 2018-04-11: 40 mg via SUBCUTANEOUS
  Filled 2018-04-11: qty 0.4

## 2018-04-11 MED ORDER — LORAZEPAM 2 MG/ML IJ SOLN
INTRAMUSCULAR | Status: AC
  Administered 2018-04-11: 1 mg via INTRAVENOUS
  Filled 2018-04-11: qty 1

## 2018-04-11 MED ORDER — LORAZEPAM 2 MG/ML IJ SOLN
1.0000 mg | Freq: Once | INTRAMUSCULAR | Status: AC
  Administered 2018-04-11: 1 mg via INTRAVENOUS

## 2018-04-11 MED ORDER — PERFLUTREN LIPID MICROSPHERE
1.0000 mL | INTRAVENOUS | Status: AC | PRN
  Administered 2018-04-11: 2 mL via INTRAVENOUS
  Filled 2018-04-11: qty 10

## 2018-04-11 MED ORDER — PERFLUTREN LIPID MICROSPHERE
INTRAVENOUS | Status: AC
  Administered 2018-04-11: 2 mL via INTRAVENOUS
  Filled 2018-04-11: qty 10

## 2018-04-11 NOTE — Evaluation (Signed)
Occupational Therapy Evaluation and Discharge Patient Details Name: [name] MRN: [id number] DOB: Nov 29, 1960 Today's Date: 04/11/2018    History of Present Illness [name]  is a 57 y.o. male, w HTN, hyperlipidemia, dm2, CAD s/p CABG,  CVA (without residual deficit), admitted for dysarthria, slurred speech, right facial droop and difficulty with gait stability. MRI revealed no acute abnormality.    Clinical Impression   PTA Pt independent in ADL and mobility. Pt independent for ADL this session - able to demonstrate donning socks and shoes, sink level grooming, and transfers. Pt did state that he's been having trouble with his hands tingling and dropping items like utensils and other items. Pt educated in compensatory strategies and provided with red built up handles. No further needs or questions from the patient for OT at this time. OT to sign off. Thank you for the opportunity to serve this patient.     Follow Up Recommendations  No OT follow up    Equipment Recommendations  None recommended by OT    Recommendations for Other Services       Precautions / Restrictions        Mobility Bed Mobility               General bed mobility comments: received in chair  Transfers Overall transfer level: Independent Equipment used: None             General transfer comment: no physical assist required    Balance Overall balance assessment: Independent Sitting-balance support: Feet unsupported Sitting balance-Leahy Scale: Good       Standing balance-Leahy Scale: Good               High level balance activites: Side stepping;Backward walking;Direction changes;Turns;Sudden stops;Head turns High Level Balance Comments: no difficulty with higher level tasks           ADL either performed or assessed with clinical judgement   ADL Overall ADL's : Independent                                             Vision Patient Visual Report:  No change from baseline Vision Assessment?: No apparent visual deficits     Perception     Praxis      Pertinent Vitals/Pain Pain Assessment: No/denies pain     Hand Dominance Right   Extremity/Trunk Assessment Upper Extremity Assessment Upper Extremity Assessment: RUE deficits/detail;LUE deficits/detail RUE Deficits / Details: intermittent tingling RUE Sensation: decreased light touch LUE Deficits / Details: intermittent tingling LUE Sensation: decreased light touch   Lower Extremity Assessment Lower Extremity Assessment: Overall WFL for tasks assessed       Communication Communication Communication: No difficulties   Cognition Arousal/Alertness: Awake/alert Behavior During Therapy: WFL for tasks assessed/performed Overall Cognitive Status: Within Functional Limits for tasks assessed                                     General Comments  built up handles (red tubing) provided as Pt states that he has intermittent tingling in Bilateral hands.     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        OT Problem List:        OT Treatment/Interventions:      OT Goals(Current goals can be found in the care plan section) Acute Rehab OT Goals Patient Stated Goal: get home OT Goal Formulation: With patient Time For Goal Achievement:  04/25/18 Potential to Achieve Goals: Good  OT Frequency:     Barriers to D/C:            Co-evaluation              AM-PAC PT "6 Clicks" Daily Activity     Outcome Measure Help from another person eating meals?: None Help from another person taking care of personal grooming?: None Help from another person toileting, which includes using toliet, bedpan, or urinal?: None Help from another person bathing (including washing, rinsing, drying)?: None Help from another person to put on and taking off regular upper body clothing?: None Help from another person to put on and taking off regular lower body clothing?: None 6 Click Score: 24   End of Session Nurse Communication: Mobility status  Activity Tolerance: Patient tolerated treatment well Patient left: in chair                   Time: 1610-9604 OT Time Calculation (min): 13 min Charges:  OT General Charges $OT Visit: 1 Visit OT Evaluation $OT Eval Low Complexity: 1 Low  Sherryl Manges OTR/L [phone]  Evern Bio Mirka Barbone 04/11/2018, 9:10 AM

## 2018-04-11 NOTE — Progress Notes (Signed)
Pt verbalized that Dr. Selena Batten told him to stop taking allopurinol. Attending MD, please address.    Sim Boast, RN

## 2018-04-11 NOTE — Progress Notes (Signed)
  Echocardiogram 2D Echocardiogram has been performed.  [name] F 04/11/2018, 4:05 PM

## 2018-04-11 NOTE — Progress Notes (Signed)
PROGRESS NOTE    [name]  GNF:[id number] DOB: 26-Jul-1961 DOA: 04/10/2018 PCP: Marcine Matar, MD  Outpatient Specialists:     Brief Narrative:  57 yo male with a PMH of acute CVA of left thalamus (2 weeks following CABG), hyperlipidemia, hypertension, type 2 diabetes mellitus, CAD s/p CABG, and gout is here following an incident at home yesterday around 1600, which was approximately 10 min in duration, during which the patient experienced a facial droop and aphasia as well as severe stomach pain and diaphoresis. The patient endorses his last stroke affected his right side during which he was having a persistent burning sensation. From that incident, he was discharged on Aspirin 81 mg. Endorses taking all his medications as prescribed, daily. Reports burying his wife over the weekend and is visibly distraught talking about it. Once at the ED, all symptoms had resolved and the patient reported he was back to his baseline. However CVA/TIA workup was indicated. The patient was admitted for further workup as well as ARF.   CXR: A tubular density in the left upper lung peripherally likely correlates with the mucus plugged dilated airway in this region on the CT scan from March 20, 2017. The finding is more prominent on today's chest x-ray than the chest x-ray from March 20, 2017 suggesting the possibility of mild worsening. No other acute abnormalities. CT Head wo contrast: 1. Mild bifrontal involutional changes of the brain slightly advanced in appearance for age. 2. No acute intracranial abnormality. 3. Tiny chronic left thalamic lacunar infarct. MR Brain wo contrast: Brain: No acute infarction, hemorrhage, hydrocephalus, extra-axial collection or mass lesion. Few stable nonspecific T2 FLAIR hyperintensities in subcortical and periventricular white matter are compatible with mild chronic microvascular ischemic changes for age. Mild stable brain volume loss. Small chronic lacunar infarct within the left  thalamus. MRA Brain: 1. No acute intracranial abnormality. 2. Stable mild chronic microvascular ischemic changes and volume loss of the brain. Small chronic lacunar infarct in the left thalamus. 3. No large vessel occlusion of the anterior or posterior intracranial circulation. 4. Stable intracranial atherosclerosis with multiple areas of stenosis, severe in the bilateral proximal MCA. US Renal: 1. No evidence of hydronephrosis.2. Cholelithiasis incidentally noted. Gallbladder otherwise grossly unremarkable.  Pertinent labs include: PT 13/INR 0.99. PTT 32. CBC w diff - Lymphs 4.1. CMP - CO2 18 Glu 112 BUN 44 Cr 1.74. Troponin 0.01. Pending labs: Antithrombin III, Protein C activity + total, Protein S activity + total, Lupus anticoags, beta-2 glycoprotein, homocysteine, factor 5 leiden, prothrombin gene mutation, cardiolipin antibodies, CK, lipid panel, HgbA1c, HIV antibody.   Neurology following recommends echo and carotid dopplers.    Assessment & Plan:   Principal Problem:   TIA (transient ischemic attack) Active Problems:   Benign essential HTN   Type 2 diabetes mellitus without complication, without long-term current use of insulin (HCC)   ARF (acute renal failure) (HCC)   Transient Ischemic Attack: -The patient feels back to his baseline -Endorses taking aspirin everyday, missed only one day this week -CT Head wo contrast: 1. Mild bifrontal involutional changes of the brain slightly advanced in appearance for age. 2. No acute intracranial abnormality. 3. Tiny chronic left thalamic lacunar infarct. -MR Brain wo contrast: Brain: No acute infarction, hemorrhage, hydrocephalus, extra-axial collection or mass lesion. Few stable nonspecific T2 FLAIR hyperintensities in subcortical and periventricular white matter are compatible with mild chronic microvascular ischemic changes for age. Mild stable brain volume loss. Small chronic lacunar infarct within the left thalamus. -MRA  Brain: 1. No acute  intracranial abnormality. 2. Stable mild chronic microvascular ischemic changes and volume loss of the brain. Small chronic lacunar infarct in the left thalamus. 3. No large vessel occlusion of the anterior or posterior intracranial circulation. 4. Stable intracranial atherosclerosis with multiple areas of stenosis, severe in the bilateral proximal MCA. -Troponin 0.01 -PT 13/INR 0.99. PTT 32. CBC w diff - Lymphs 4.1 - Pending labs: Antithrombin III, Protein C activity + total, Protein S activity + total, Lupus anticoags, beta-2 glycoprotein, homocysteine, factor 5 leiden, prothrombin gene mutation -Neurology following: recommends A1c, cardiac echo, carotid dopplers, continue aspirin, PT/OT, stroke swallow, BP control -PT eval: mobilizing well, no further acute PT needs. Signed off. -OT eval: patient independent in ADLs and mobility. OT signed off. -Speech Language Pathology eval pending  Acute Renal Failure: -BUN 44 Cr 1.74 -Lisinopril, lasix, ibuprofen, metformin d/c -CPK pending -IVF -US Renal: 1. No evidence of hydronephrosis.2. Cholelithiasis incidentally noted. Gallbladder otherwise grossly unremarkable. -Repeat CMP  Hx of acute CVA of Left Thalamus: -Aspirin 81 mg daily  Benign Essential Hypertension: -PCP following -BP well-controlled at this time -Continue Metoprolol 25 mg BID  Type 2 Diabetes Mellitus: -Blood sugar well-controlled at this time -Metformin d/c due to ARF -Continue novolog -Recent glu 155 -HgbA1c pending  Hyperlipidemia: -Continue Lipitor -Lipid panel pending  CAD s/p CABG: -Continue Lipitor -Continue aspirin  Gout: -Well-controlled, no active flare -Continue Allopurinol 100 mg   DVT prophylaxis: Lovenox Code Status: Full Family Communication: No family at bedside during interview Disposition Plan: The patient will receive VAS US Carotid and Echo for further workup.    Consultants:   Neurology  Procedures:   ECHO  VAS US  Carotid  Antimicrobials:   None   Subjective: The patient was sitting upright in hospital chair alert and oriented to person, place, and time in no acute distress. He reports he feels almost to his baseline; however, he still feels like he is struggling with his speech. Denies problems with his balance/gait. Reports his PT eval went well. No observable facial droop.  Objective: Vitals:   04/11/18 0130 04/11/18 0445 04/11/18 0530 04/11/18 0730  BP: 122/90 107/78 113/79 105/78  Pulse: 79 80 80 79  Resp: 18 18 18 19   Temp: 98.4 F (36.9 C) 98.6 F (37 C) 98.4 F (36.9 C) 97.9 F (36.6 C)  TempSrc: Oral Oral Oral Oral  SpO2: 98% 100% 99% 100%  Weight:      Height:        Intake/Output Summary (Last 24 hours) at 04/11/2018 0927 Last data filed at 04/11/2018 0500 Gross per 24 hour  Intake 450 ml  Output -  Net 450 ml   Filed Weights   04/10/18 1637 04/10/18 2131  Weight: 99.8 kg (220 lb) 97.7 kg (215 lb 6.2 oz)    Examination:  General exam: Appears calm and comfortable  Respiratory system: Clear to auscultation. Respiratory effort normal. Cardiovascular system: S1 & S2 heard, RRR. No JVD, murmurs, rubs, gallops or clicks. No pedal edema. Gastrointestinal system: Abdomen is nondistended, soft and nontender. No organomegaly or masses felt. Normal bowel sounds heard. Central nervous system: Alert and oriented. No focal neurological deficits. Equal strength and sensation in upper and lower extremities. Extremities: Symmetric 5 x 5 power. Skin: No rashes, lesions or ulcers Psychiatry: Judgement and insight appear normal. Mood & affect appropriate.     Data Reviewed: I have personally reviewed following labs and imaging studies  CBC: Recent Labs  Lab 04/10/18 1648 04/10/18 1655  WBC 8.7  --   NEUTROABS 3.5  --   HGB 13.5 13.6  HCT 39.8 40.0  MCV 90.2  --   PLT 283  --    Basic Metabolic Panel: Recent Labs  Lab 04/10/18 1648 04/10/18 1655  NA 139 137  K 4.7 4.8   CL 111 110  CO2 18*  --   GLUCOSE 112* 110*  BUN 44* 47*  CREATININE 1.74* 1.70*  CALCIUM 9.3  --    GFR: Estimated Creatinine Clearance: 51.9 mL/min (A) (by C-G formula based on SCr of 1.7 mg/dL (H)). Liver Function Tests: Recent Labs  Lab 04/10/18 1648  AST 26  ALT 12  ALKPHOS 102  BILITOT 0.4  PROT 7.6  ALBUMIN 4.1   No results for input(s): LIPASE, AMYLASE in the last 168 hours. No results for input(s): AMMONIA in the last 168 hours. Coagulation Profile: Recent Labs  Lab 04/10/18 1648  INR 0.99   Cardiac Enzymes: No results for input(s): CKTOTAL, CKMB, CKMBINDEX, TROPONINI in the last 168 hours. BNP (last 3 results) No results for input(s): PROBNP in the last 8760 hours. HbA1C: No results for input(s): HGBA1C in the last 72 hours. CBG: Recent Labs  Lab 04/10/18 2350 04/11/18 0750  GLUCAP 105* 155*   Lipid Profile: No results for input(s): CHOL, HDL, LDLCALC, TRIG, CHOLHDL, LDLDIRECT in the last 72 hours. Thyroid Function Tests: No results for input(s): TSH, T4TOTAL, FREET4, T3FREE, THYROIDAB in the last 72 hours. Anemia Panel: No results for input(s): VITAMINB12, FOLATE, FERRITIN, TIBC, IRON, RETICCTPCT in the last 72 hours. Urine analysis:    Component Value Date/Time   COLORURINE YELLOW 11/25/2015 0120   APPEARANCEUR CLEAR 11/25/2015 0120   LABSPEC 1.019 11/25/2015 0120   PHURINE 6.5 11/25/2015 0120   GLUCOSEU NEGATIVE 11/25/2015 0120   HGBUR NEGATIVE 11/25/2015 0120   HGBUR negative 04/09/2010 0814   BILIRUBINUR NEGATIVE 11/25/2015 0120   KETONESUR NEGATIVE 11/25/2015 0120   PROTEINUR >300 (A) 11/25/2015 0120   UROBILINOGEN 0.2 02/24/2015 1601   NITRITE NEGATIVE 11/25/2015 0120   LEUKOCYTESUR TRACE (A) 11/25/2015 0120   Sepsis Labs: @LABRCNTIP (procalcitonin:4,lacticidven:4)  )No results found for this or any previous visit (from the past 240 hour(s)).       Radiology Studies: Dg Chest 2 View  Result Date: 04/10/2018 CLINICAL DATA:   TIA.  Loss of balance.  Slurred speech. EXAM: CHEST - 2 VIEW COMPARISON:  March 20, 2017 FINDINGS: A tubular density in the periphery of the left upper lobe correlates with a mucous filled dilated airway seen on the CT scan from March 20, 2017. The finding is more conspicuous on today's chest x-ray compared to the March 20, 2017 x-ray suggesting the possibility of mild interval worsening. No pneumothorax. The cardiomediastinal silhouette is stable. No other acute abnormalities. IMPRESSION: A tubular density in the left upper lung peripherally likely correlates with the mucus plugged dilated airway in this region on the CT scan from March 20, 2017. The finding is more prominent on today's chest x-ray than the chest x-ray from March 20, 2017 suggesting the possibility of mild worsening. No other acute abnormalities. Electronically Signed   By: Gerome Sam III M.D   On: 04/10/2018 18:36   Ct Head Wo Contrast  Result Date: 04/10/2018 CLINICAL DATA:  Dysarthria, trouble with balance and right-sided facial droop EXAM: CT HEAD WITHOUT CONTRAST TECHNIQUE: Contiguous axial images were obtained from the base of the skull through the vertex without intravenous contrast. COMPARISON:  CT 09/24/2003, MRI 12/11/2015 FINDINGS: Brain:  Tiny chronic left thalamic lacunar infarct. The small bifrontal involutional changes of the brain, slightly advanced for age. No hydrocephalus. No acute intracranial hemorrhage, large vascular territory infarct, midline shift or edema. No intra-axial mass nor extra-axial fluid collections. Patent cisterns. Vascular: No hyperdense vessel sign. Atherosclerosis noted of the cavernous sinuses. Skull: Normal. Negative for fracture or focal lesion. Sinuses/Orbits: No acute finding. Other: None IMPRESSION: 1. Mild bifrontal involutional changes of the brain slightly advanced in appearance for age. 2. No acute intracranial abnormality. 3. Tiny chronic left thalamic lacunar infarct. Electronically Signed    By: Tollie Eth M.D.   On: 04/10/2018 17:42   Mr Brain Wo Contrast  Result Date: 04/11/2018 CLINICAL DATA:  57 y/o M; transient episode of difficulty speaking, imbalance, right-sided facial droop. TIA, initial exam. EXAM: MRI HEAD WITHOUT CONTRAST MRA HEAD WITHOUT CONTRAST TECHNIQUE: Multiplanar, multiecho pulse sequences of the brain and surrounding structures were obtained without intravenous contrast. Angiographic images of the head were obtained using MRA technique without contrast. COMPARISON:  04/10/2018 CT head. 12/11/2015 CTA head and neck. 12/11/2015 MRI and MRA of the head. FINDINGS: MRI HEAD FINDINGS Brain: No acute infarction, hemorrhage, hydrocephalus, extra-axial collection or mass lesion. Few stable nonspecific T2 FLAIR hyperintensities in subcortical and periventricular white matter are compatible with mild chronic microvascular ischemic changes for age. Mild stable brain volume loss. Small chronic lacunar infarct within the left thalamus. Vascular: As below. Skull and upper cervical spine: Normal marrow signal. Sinuses/Orbits: Negative. Other: None. MRA HEAD FINDINGS Internal carotid arteries: Mild left cavernous and terminal ICA stenosis. Moderate right paraclinoid stenosis. Anterior cerebral arteries:  Moderate right proximal A2 stenosis. Middle cerebral arteries: Severe left M1 long segment of stenosis. Severe right M1 tandem segments of stenosis. Anterior communicating artery: Patent. Posterior communicating arteries:  Patent. Posterior cerebral arteries: Multiple areas of stenosis, moderate in the P2 segments bilaterally. Patent bilateral vertebral arteries and the basilar artery. Basilar artery:  Patent. Vertebral arteries:  Patent. No additional area of high-grade stenosis, aneurysm, or vascular malformation identified. IMPRESSION: 1. No acute intracranial abnormality. 2. Stable mild chronic microvascular ischemic changes and volume loss of the brain. Small chronic lacunar infarct in the  left thalamus. 3. No large vessel occlusion of the anterior or posterior intracranial circulation. 4. Stable intracranial atherosclerosis with multiple areas of stenosis, severe in the bilateral proximal MCA. Electronically Signed   By: Mitzi Hansen M.D.   On: 04/11/2018 04:15   US Renal  Result Date: 04/10/2018 CLINICAL DATA:  Acute onset of renal failure. EXAM: RENAL / URINARY TRACT ULTRASOUND COMPLETE COMPARISON:  CT of the abdomen and pelvis performed 03/20/2017 FINDINGS: Right Kidney: Length: 11.2 cm. Echogenicity within normal limits. No mass or hydronephrosis visualized. Left Kidney: Length: 9.7 cm. Echogenicity within normal limits. No mass or hydronephrosis visualized. Bladder: Appears normal for degree of bladder distention. Bilateral ureteral jets are visualized. Incidental note is made of stones within the gallbladder. IMPRESSION: 1. No evidence of hydronephrosis. 2. Cholelithiasis incidentally noted. Gallbladder otherwise grossly unremarkable. Electronically Signed   By: Roanna Raider M.D.   On: 04/10/2018 21:33   Mr Maxine Glenn Head Wo Contrast  Result Date: 04/11/2018 CLINICAL DATA:  57 y/o M; transient episode of difficulty speaking, imbalance, right-sided facial droop. TIA, initial exam. EXAM: MRI HEAD WITHOUT CONTRAST MRA HEAD WITHOUT CONTRAST TECHNIQUE: Multiplanar, multiecho pulse sequences of the brain and surrounding structures were obtained without intravenous contrast. Angiographic images of the head were obtained using MRA technique without contrast. COMPARISON:  04/10/2018 CT head. 12/11/2015  CTA head and neck. 12/11/2015 MRI and MRA of the head. FINDINGS: MRI HEAD FINDINGS Brain: No acute infarction, hemorrhage, hydrocephalus, extra-axial collection or mass lesion. Few stable nonspecific T2 FLAIR hyperintensities in subcortical and periventricular white matter are compatible with mild chronic microvascular ischemic changes for age. Mild stable brain volume loss. Small chronic  lacunar infarct within the left thalamus. Vascular: As below. Skull and upper cervical spine: Normal marrow signal. Sinuses/Orbits: Negative. Other: None. MRA HEAD FINDINGS Internal carotid arteries: Mild left cavernous and terminal ICA stenosis. Moderate right paraclinoid stenosis. Anterior cerebral arteries:  Moderate right proximal A2 stenosis. Middle cerebral arteries: Severe left M1 long segment of stenosis. Severe right M1 tandem segments of stenosis. Anterior communicating artery: Patent. Posterior communicating arteries:  Patent. Posterior cerebral arteries: Multiple areas of stenosis, moderate in the P2 segments bilaterally. Patent bilateral vertebral arteries and the basilar artery. Basilar artery:  Patent. Vertebral arteries:  Patent. No additional area of high-grade stenosis, aneurysm, or vascular malformation identified. IMPRESSION: 1. No acute intracranial abnormality. 2. Stable mild chronic microvascular ischemic changes and volume loss of the brain. Small chronic lacunar infarct in the left thalamus. 3. No large vessel occlusion of the anterior or posterior intracranial circulation. 4. Stable intracranial atherosclerosis with multiple areas of stenosis, severe in the bilateral proximal MCA. Electronically Signed   By: Mitzi Hansen M.D.   On: 04/11/2018 04:15        Scheduled Meds: . allopurinol  100 mg Oral Daily  . aspirin  300 mg Rectal Daily   Or  . aspirin  325 mg Oral Daily  . atorvastatin  80 mg Oral q1800  . enoxaparin (LOVENOX) injection  30 mg Subcutaneous Q24H  . fluticasone  2 spray Each Nare Daily  . folic acid  1 mg Oral Daily  . insulin aspart  0-5 Units Subcutaneous QHS  . insulin aspart  0-9 Units Subcutaneous TID WC  . loratadine  10 mg Oral Daily  . metoprolol tartrate  25 mg Oral BID   Continuous Infusions:   LOS: 1 day    Nedra Hai, PA-S Delrae Sawyers Arrien MD [facility] Pager [phone]  If 7PM-7AM, please contact  night-coverage [email or web address] Password [email or web address] 04/11/2018, 9:27 AM

## 2018-04-11 NOTE — Evaluation (Signed)
Speech Language Pathology Evaluation Patient Details Name: [name] MRN: [id number] DOB: 08/13/1961 Today's Date: 04/11/2018 Time: 2449-7530 SLP Time Calculation (min) (ACUTE ONLY): 10 min  Problem List:  Patient Active Problem List   Diagnosis Date Noted  . TIA (transient ischemic attack) 04/10/2018  . ARF (acute renal failure) (HCC) 04/10/2018  . Colon cancer screening 05/04/2017  . Brodie's abscess of thoracic spine (HCC) 06/24/2016  . CVA (cerebral infarction) 12/11/2015  . Acute ischemic VBA thalamic stroke (HCC)   . Atherosclerosis of native coronary artery of native heart without angina pectoris   . H/O heart bypass surgery   . Benign essential HTN   . Type 2 diabetes mellitus without complication, without long-term current use of insulin (HCC)   . S/P AVR 11/25/2015  . CAD (coronary artery disease)   . Chronic periodontitis 11/21/2015  . Bicuspid aortic valve   . AKI (acute kidney injury) (HCC)   . Controlled type 2 diabetes mellitus with diabetic nephropathy, without long-term current use of insulin (HCC)   . Essential hypertension 02/24/2015  . Dyslipidemia 02/24/2015  . GERD (gastroesophageal reflux disease) 05/02/2013  . Primary gout 05/02/2013  . Allergic rhinitis due to allergen 07/09/2010   Past Medical History:  Past Medical History:  Diagnosis Date  . Allergy   . Anemia   . Anxiety   . Baker's cyst of knee   . Blood transfusion without reported diagnosis    as baby   . Coronary artery disease    quadruple bypass - March 2016  . GERD (gastroesophageal reflux disease)   . Gouty arthritis    "real bad" (01/17/2013)  . Heart murmur   . Hypercholesteremia   . Hypertension   . Myocardial infarction (HCC) 2017  . Stroke (HCC)   . Type II diabetes mellitus (HCC)    Past Surgical History:  Past Surgical History:  Procedure Laterality Date  . AORTIC VALVE REPLACEMENT N/A 11/25/2015   Procedure: AORTIC VALVE REPLACEMENT (AVR) USING A MAGNA EASE ;   Surgeon: Alleen Borne, MD;  Location: [facility] OR;  Service: Open Heart Surgery;  Laterality: N/A;  . CARDIAC CATHETERIZATION N/A 11/19/2015   Procedure: Right/Left Heart Cath and Coronary Angiography;  Surgeon: Lyn Records, MD;  Location: [facility] INVASIVE CV LAB;  Service: Cardiovascular;  Laterality: N/A;  . COLONOSCOPY  2004  . CORONARY ARTERY BYPASS GRAFT N/A 11/25/2015   Procedure: CORONARY ARTERY BYPASS GRAFTING (CABG)x4 LIMA-LAD; SVG-OM; SVG-RCA; SVG-DIAG;  Surgeon: Alleen Borne, MD;  Location: [facility] OR;  Service: Open Heart Surgery;  Laterality: N/A;  . CYST REMOVAL TRUNK N/A 08/24/2016   Procedure: EXCISION OF BACK ABSCESS;  Surgeon: Jimmye Norman, MD;  Location: [facility];  Service: General;  Laterality: N/A;  . FINGER AMPUTATION Right 1980's   3rd & 4th digits "got them mashed" (01/17/2013)  . KNEE ARTHROSCOPY Right 1980's  . MULTIPLE EXTRACTIONS WITH ALVEOLOPLASTY Bilateral 11/21/2015   Procedure: Extraction of tooth #'s 2,12 with alveoloplasty and gross debridement of remaining dentition;  Surgeon: Charlynne Pander, DDS;  Location: [facility] OR;  Service: Oral Surgery;  Laterality: Bilateral;  . TEE WITHOUT CARDIOVERSION N/A 11/20/2015   Procedure: TRANSESOPHAGEAL ECHOCARDIOGRAM (TEE);  Surgeon: Laurey Morale, MD;  Location: [facility] ENDOSCOPY;  Service: Cardiovascular;  Laterality: N/A;  . TEE WITHOUT CARDIOVERSION N/A 11/25/2015   Procedure: TRANSESOPHAGEAL ECHOCARDIOGRAM (TEE);  Surgeon: Alleen Borne, MD;  Location: [facility] OR;  Service: Open Heart Surgery;  Laterality: N/A;   HPI:  [name] is  a 57 y.o. male.  He has significant cardiac disease and has had a prior stroke.  He presents today with symptoms that occurred around 410 this afternoon.  He states he was sitting on the porch and when he got up.  He was having trouble speaking and trouble with his balance.  His daughter says his right side of his face was drooped.  The symptoms lasted about 5 to 10 minutes and it now completely  resolved.  He denies any headache blurry vision double vision chest pain shortness of breath.  No nausea no vomiting no diarrhea no urinary symptoms.  He currently denies any weakness numbness or balance issues.  He said he had a prior stroke about a year ago because the right side of his face to be twitchy.  He states he has no lasting effects from that prior stroke.  He also has had multiple cardiac valves and an MI.  He also has diabetes and hypertension. MRI revealed no acute intracranial abnormality with stable mild chronic microvascular ischemic changes and volume loss of the brain as well as small chronic lacunar infarct in the left thalamus.   Assessment / Plan / Recommendation Clinical Impression  Pt presents with functional cognitive linguistic abilities and facial weakness/dysarthria appear to have resolved. Pt states that his speech continues to sound mildly different (especially on the phone) but continues to improve. SLP provided pt with some strategies and given age/lack of acute neurological deficits, pt prognosis for continued improvement is great. All questions answered to pt satisfaction. ST to sign off with no further services indicated.    SLP Assessment  SLP Recommendation/Assessment: Patient does not need any further Speech Lanaguage Pathology Services SLP Visit Diagnosis: Dysarthria and anarthria (R47.1)    Follow Up Recommendations  None    Frequency and Duration           SLP Evaluation Cognition  Overall Cognitive Status: Within Functional Limits for tasks assessed       Comprehension  Auditory Comprehension Overall Auditory Comprehension: Appears within functional limits for tasks assessed Visual Recognition/Discrimination Discrimination: Within Function Limits Reading Comprehension Reading Status: Within funtional limits    Expression Expression Primary Mode of Expression: Verbal Verbal Expression Overall Verbal Expression: Appears within functional  limits for tasks assessed Initiation: No impairment Automatic Speech: Name;Social Response Level of Generative/Spontaneous Verbalization: Conversation Repetition: No impairment Naming: No impairment Pragmatics: No impairment Written Expression Dominant Hand: Right Written Expression: Not tested   Oral / Motor  Oral Motor/Sensory Function Overall Oral Motor/Sensory Function: Within functional limits Motor Speech Overall Motor Speech: Appears within functional limits for tasks assessed Respiration: Within functional limits Phonation: Normal Resonance: Within functional limits Articulation: Within functional limitis Intelligibility: Intelligible Motor Planning: Witnin functional limits Motor Speech Errors: Not applicable   GO                    Lennox Leikam 04/11/2018, 10:28 AM

## 2018-04-11 NOTE — Progress Notes (Signed)
PROGRESS NOTE    [name]  RUE:[id number] DOB: Oct 13, 1960 DOA: 04/10/2018 PCP: Marcine Matar, MD    Brief Narrative:  57 year old male who presented with disbalance.  He does have significant past medical history for hypertension, dyslipidemia, type 2 diabetes mellitus, coronary artery disease and history of CVA.  He developed a sudden dysarthria, slurred speech, right facial droop and difficulty ambulating.  The episode lasted for about 5 to 10 minutes and then resolved by itself. On the initial physical examination blood pressure 117/90, heart rate 77, respiratory rate 18, temperature 98.1, oxygen saturation 100%.  Patient was awake and alert, nonfocal, lungs clear to auscultation, heart S1-S2 present rhythmic, the abdomen was soft nontender, no lower extremity edema.  CT was negative for acute changes.  EKG with normal sinus rhythm, normal axis, normal intervals.  Patient was admitted to the hospital with working diagnosis of transitory ischemic attack.  Assessment & Plan:   Principal Problem:   TIA (transient ischemic attack) Active Problems:   Benign essential HTN   Type 2 diabetes mellitus without complication, without long-term current use of insulin (HCC)   ARF (acute renal failure) (HCC)  1. Transitory ischemic attack. Patient has recovered his focal deficit, no further weakness, continue neuro checks and physical therapy evaluation. MRI/ MRA with areas of severe stenosis in bilateral proximal MCA's. Will continue to follow neurology recommendations including echocardiogram and carotid doppler US. Continue aspirin and statin.   2. HTN. Continue blood pressure control with metoprolol, systolic blood pressure 120 to 130 mmHg.   3. T2DM. Will continue glucose cover and monitoring, capillary glucose 155, 123, 127.   4. AKI. Will continue to follow on renal function and electrolytes, continue to hold on nephrotoxic medications. Serum Cr at 1,70 with K at 4,8 and serum  bicarbonate at 18.   DVT prophylaxis: enoxaparin   Code Status:  full Family Communication: I spoke with patient's family at the bedside and all questions were addressed.  Disposition Plan/ discharge barriers: Pending neurology workup.    Consultants:   Neurology   Procedures:     Antimicrobials:       Subjective: Patient is feeling well, no nausea or vomiting, no weakness or dis-balance.   Objective: Vitals:   04/11/18 0730 04/11/18 0930 04/11/18 1326 04/11/18 1626  BP: 105/78 (!) 138/101 120/90 135/88  Pulse: 79 82 76 76  Resp: 19 18 16 18   Temp: 97.9 F (36.6 C) 97.6 F (36.4 C) 97.7 F (36.5 C) 98.1 F (36.7 C)  TempSrc: Oral Oral Oral Oral  SpO2: 100% 100% 99% 98%  Weight:      Height:        Intake/Output Summary (Last 24 hours) at 04/11/2018 1631 Last data filed at 04/11/2018 1400 Gross per 24 hour  Intake 1290 ml  Output -  Net 1290 ml   Filed Weights   04/10/18 1637 04/10/18 2131  Weight: 99.8 kg (220 lb) 97.7 kg (215 lb 6.2 oz)    Examination:   General: Not in pain or dyspnea.  Neurology: Awake and alert, non focal, strength 5/5 all 4 extremities, proximal and distal.  E ENT: no pallor, no icterus, oral mucosa moist Cardiovascular: No JVD. S1-S2 present, rhythmic, no gallops, rubs, or murmurs. No lower extremity edema. Pulmonary: vesicular breath sounds bilaterally, adequate air movement, no wheezing, rhonchi or rales. Gastrointestinal. Abdomen with no organomegaly, non tender, no rebound or guarding Skin. No rashes Musculoskeletal: no joint deformities     Data Reviewed: I have personally  reviewed following labs and imaging studies  CBC: Recent Labs  Lab 04/10/18 1648 04/10/18 1655  WBC 8.7  --   NEUTROABS 3.5  --   HGB 13.5 13.6  HCT 39.8 40.0  MCV 90.2  --   PLT 283  --    Basic Metabolic Panel: Recent Labs  Lab 04/10/18 1648 04/10/18 1655  NA 139 137  K 4.7 4.8  CL 111 110  CO2 18*  --   GLUCOSE 112* 110*  BUN 44*  47*  CREATININE 1.74* 1.70*  CALCIUM 9.3  --    GFR: Estimated Creatinine Clearance: 51.9 mL/min (A) (by C-G formula based on SCr of 1.7 mg/dL (H)). Liver Function Tests: Recent Labs  Lab 04/10/18 1648  AST 26  ALT 12  ALKPHOS 102  BILITOT 0.4  PROT 7.6  ALBUMIN 4.1   No results for input(s): LIPASE, AMYLASE in the last 168 hours. No results for input(s): AMMONIA in the last 168 hours. Coagulation Profile: Recent Labs  Lab 04/10/18 1648  INR 0.99   Cardiac Enzymes: No results for input(s): CKTOTAL, CKMB, CKMBINDEX, TROPONINI in the last 168 hours. BNP (last 3 results) No results for input(s): PROBNP in the last 8760 hours. HbA1C: No results for input(s): HGBA1C in the last 72 hours. CBG: Recent Labs  Lab 04/10/18 2350 04/11/18 0750 04/11/18 1152  GLUCAP 105* 155* 123*   Lipid Profile: No results for input(s): CHOL, HDL, LDLCALC, TRIG, CHOLHDL, LDLDIRECT in the last 72 hours. Thyroid Function Tests: No results for input(s): TSH, T4TOTAL, FREET4, T3FREE, THYROIDAB in the last 72 hours. Anemia Panel: No results for input(s): VITAMINB12, FOLATE, FERRITIN, TIBC, IRON, RETICCTPCT in the last 72 hours.    Radiology Studies: I have reviewed all of the imaging during this hospital visit personally     Scheduled Meds: . allopurinol  100 mg Oral Daily  . aspirin  300 mg Rectal Daily   Or  . aspirin  325 mg Oral Daily  . atorvastatin  80 mg Oral q1800  . enoxaparin (LOVENOX) injection  40 mg Subcutaneous Q24H  . fluticasone  2 spray Each Nare Daily  . folic acid  1 mg Oral Daily  . insulin aspart  0-5 Units Subcutaneous QHS  . insulin aspart  0-9 Units Subcutaneous TID WC  . loratadine  10 mg Oral Daily  . metoprolol tartrate  25 mg Oral BID   Continuous Infusions:   LOS: 1 day        Nyara Capell Annett Gula, MD [facility] Pager [phone]

## 2018-04-11 NOTE — Evaluation (Signed)
Physical Therapy Evaluation Patient Details Name: [name] MRN: [id number] DOB: Jul 30, 1961 Today's Date: 04/11/2018   History of Present Illness  [name]  is a 57 y.o. male, w HTN, hyperlipidemia, dm2, CAD s/p CABG,  CVA (without residual deficit), admitted for dysarthria, slurred speech, right facial droop and difficulty with gait stability. MRI revealed no acute abnormality.   Clinical Impression  Patient seen for mobility assessment. Mobilizing well. Educated patient on BEFAST stroke symptoms. No further acute PT needs. Will sign off.     Follow Up Recommendations No PT follow up    Equipment Recommendations  None recommended by PT    Recommendations for Other Services       Precautions / Restrictions        Mobility  Bed Mobility               General bed mobility comments: received in chair  Transfers Overall transfer level: Independent Equipment used: None             General transfer comment: no physical assist required  Ambulation/Gait Ambulation/Gait assistance: Independent Gait Distance (Feet): 310 Feet Assistive device: None Gait Pattern/deviations: WFL(Within Functional Limits)     General Gait Details: steady with ambulation  Stairs            Wheelchair Mobility    Modified Rankin (Stroke Patients Only) Modified Rankin (Stroke Patients Only) Pre-Morbid Rankin Score: No symptoms Modified Rankin: No symptoms     Balance Overall balance assessment: Independent Sitting-balance support: Feet unsupported Sitting balance-Leahy Scale: Good       Standing balance-Leahy Scale: Good               High level balance activites: Side stepping;Backward walking;Direction changes;Turns;Sudden stops;Head turns High Level Balance Comments: no difficulty with higher level tasks             Pertinent Vitals/Pain Pain Assessment: No/denies pain    Home Living Family/patient expects to be discharged to:: Private  residence Living Arrangements: Children(daughter, son, son in Social worker) Available Help at Discharge: Family Type of Home: House Home Access: Stairs to enter Entrance Stairs-Rails: Can reach both Entrance Stairs-Number of Steps: 5 Home Layout: One level Home Equipment: Cane - single point      Prior Function Level of Independence: Independent               Hand Dominance   Dominant Hand: Right    Extremity/Trunk Assessment   Upper Extremity Assessment Upper Extremity Assessment: Defer to OT evaluation    Lower Extremity Assessment Lower Extremity Assessment: Overall WFL for tasks assessed       Communication   Communication: No difficulties  Cognition Arousal/Alertness: Awake/alert Behavior During Therapy: WFL for tasks assessed/performed Overall Cognitive Status: Within Functional Limits for tasks assessed                                        General Comments      Exercises     Assessment/Plan    PT Assessment Patent does not need any further PT services  PT Problem List         PT Treatment Interventions      PT Goals (Current goals can be found in the Care Plan section)  Acute Rehab PT Goals PT Goal Formulation: All assessment and education complete, DC therapy    Frequency     Barriers to discharge  Co-evaluation               AM-PAC PT "6 Clicks" Daily Activity  Outcome Measure Difficulty turning over in bed (including adjusting bedclothes, sheets and blankets)?: None Difficulty moving from lying on back to sitting on the side of the bed? : None Difficulty sitting down on and standing up from a chair with arms (e.g., wheelchair, bedside commode, etc,.)?: None Help needed moving to and from a bed to chair (including a wheelchair)?: None Help needed walking in hospital room?: A Little Help needed climbing 3-5 steps with a railing? : A Little 6 Click Score: 22    End of Session   Activity Tolerance: Patient  tolerated treatment well Patient left: in chair;with call bell/phone within reach Nurse Communication: Mobility status PT Visit Diagnosis: Other symptoms and signs involving the nervous system (Z61.096)    Time: 0454-0981 PT Time Calculation (min) (ACUTE ONLY): 14 min   Charges:   PT Evaluation $PT Eval Low Complexity: 1 Low          Charlotte Crumb, PT DPT  Board Certified Neurologic Specialist [phone]   Fabio Asa 04/11/2018, 8:33 AM

## 2018-04-11 NOTE — Consult Note (Addendum)
Neurology Consultation  Reason for Consult: TIA Referring Physician: Arrien  History is obtained from: Patient  HPI: [name] is a 57 y.o. male who has stroke factors of hypertension, hyperlipidemia and diabetes.  Patient states that yesterday on 04/10/2018 he was sitting on his porch.  He was fine up until 1600 hours.  He was sitting with his daughter when she suddenly noticed that he had a facial droop.  He does not recall which side was drooping.  Daughter asked him if he was okay and he noted that he could not get anything out.  He was silent.  At that point, apparently the cable guy said I think he is having a stroke.  He try to talk to his daughter and noted that it was incoherent.  Patient then had sudden onset of severe stomach pain and diaphoresis.  Patient went to get up in both knees buckled.  After sitting back down patient went to the bathroom, upon returned from the bathroom again he noted that he was having expressive aphasia.  They called EMS.  The whole scenario lasted only about 10 minutes.  Upon entering the hospital all symptoms had resolved.  He states he is never had any symptoms like this, he denies any numbness tingling or weakness throughout.  He does state that he usually has numbness in his fingertips and every now and then blepharospasms in his right eye but this is not a new finding.  Currently he feels back to baseline.  He states that he does take aspirin every day however he has missed only one day in the past week.  He admits that his blood pressure is slightly high on a regular basis and is working on that with his PCP.  LKW: 04/10/2018 at 1600 hrs. tpa given?: no, out of the window Premorbid modified Rankin scale (mRS): 0 NIH stroke score 0 Stroke risk factors include hypertension, hyperlipidemia and diabetes  ROS: A 14 point ROS was performed and is negative except as noted in the HPI.   Past Medical History:  Diagnosis Date  . Allergy   . Anemia   . Anxiety    . Baker's cyst of knee   . Blood transfusion without reported diagnosis    as baby   . Coronary artery disease    quadruple bypass - March 2016  . GERD (gastroesophageal reflux disease)   . Gouty arthritis    "real bad" (01/17/2013)  . Heart murmur   . Hypercholesteremia   . Hypertension   . Myocardial infarction (HCC) 2017  . Stroke (HCC)   . Type II diabetes mellitus (HCC)     Family History  Problem Relation Age of Onset  . Diabetes Father   . Hypertension Father   . Hypertension Mother   . Hypertension Sister   . Hypertension Brother   . Hypertension Brother   . Hypertension Brother   . Colon cancer Neg Hx   . Esophageal cancer Neg Hx   . Rectal cancer Neg Hx   . Stomach cancer Neg Hx   . Colon polyps Neg Hx     Social History:   reports that he has never smoked. He has never used smokeless tobacco. He reports that he drinks alcohol. He reports that he has current or past drug history. Drug: Marijuana.  Medications  Current Facility-Administered Medications:  .  0.9 %  sodium chloride infusion, , Intravenous, Continuous, Pearson Grippe, MD, Last Rate: 75 mL/hr at 04/10/18 2245 .  acetaminophen (TYLENOL)  tablet 650 mg, 650 mg, Oral, Q4H PRN **OR** acetaminophen (TYLENOL) solution 650 mg, 650 mg, Per Tube, Q4H PRN **OR** acetaminophen (TYLENOL) suppository 650 mg, 650 mg, Rectal, Q4H PRN, Pearson Grippe, MD .  allopurinol (ZYLOPRIM) tablet 100 mg, 100 mg, Oral, Daily, Pearson Grippe, MD .  aspirin suppository 300 mg, 300 mg, Rectal, Daily **OR** aspirin tablet 325 mg, 325 mg, Oral, Daily, Pearson Grippe, MD, 325 mg at 04/10/18 2256 .  atorvastatin (LIPITOR) tablet 80 mg, 80 mg, Oral, q1800, Pearson Grippe, MD .  enoxaparin (LOVENOX) injection 30 mg, 30 mg, Subcutaneous, Q24H, Pearson Grippe, MD, 30 mg at 04/10/18 2256 .  fluticasone (FLONASE) 50 MCG/ACT nasal spray 2 spray, 2 spray, Each Nare, Daily, Pearson Grippe, MD .  folic acid (FOLVITE) tablet 1 mg, 1 mg, Oral, Daily, Pearson Grippe, MD .   insulin aspart (novoLOG) injection 0-5 Units, 0-5 Units, Subcutaneous, QHS, Pearson Grippe, MD .  insulin aspart (novoLOG) injection 0-9 Units, 0-9 Units, Subcutaneous, TID WC, Pearson Grippe, MD .  loratadine (CLARITIN) tablet 10 mg, 10 mg, Oral, Daily, Pearson Grippe, MD .  metoprolol tartrate (LOPRESSOR) tablet 25 mg, 25 mg, Oral, BID, Pearson Grippe, MD, 25 mg at 04/11/18 0045  Exam: Current vital signs: BP 105/78 (BP Location: Right Arm)   Pulse 79   Temp 97.9 F (36.6 C) (Oral)   Resp 19   Ht 5\' 6"  (1.676 m)   Wt 97.7 kg (215 lb 6.2 oz)   SpO2 100%   BMI 34.76 kg/m  Vital signs in last 24 hours: Temp:  [97.9 F (36.6 C)-98.6 F (37 C)] 97.9 F (36.6 C) (08/06 0730) Pulse Rate:  [77-82] 79 (08/06 0730) Resp:  [18-22] 19 (08/06 0730) BP: (105-166)/(78-104) 105/78 (08/06 0730) SpO2:  [98 %-100 %] 100 % (08/06 0730) Weight:  [97.7 kg (215 lb 6.2 oz)-99.8 kg (220 lb)] 97.7 kg (215 lb 6.2 oz) (08/05 2131)  GENERAL: Awake, alert in NAD HEENT: - Normocephalic and atraumatic, dry mm,  ABDOMEN - Soft, nontender, nondistended with normoactive BS Ext: warm, well perfused, intact peripheral pulses,   NEURO:  Mental Status: AA&Ox3, speech is clear.  Naming, repetition, fluency, and comprehension intact. Cranial Nerves: PERRL 2 mm/brisk. EOMI, visual fields full, no facial asymmetry, intact facial sensation , hearing intact, tongue/uvula/soft palate midline,  Motor: 5/5 about Deep tendon reflexes: Reflexes in the Achilles or ankle jerks.  He does have 2+ in the upper extremities.  It should be noted that patient was having a hard time relaxing when I was doing his lower extremity deep tendon reflexes. Tone: is normal and bulk is normal Sensation- Intact to light touch bilaterally Coordination: FTN intact bilaterally, no ataxia in BLE. Gait- deferred  Labs I have reviewed labs in epic and the results pertinent to this consultation are:   CBC    Component Value Date/Time   WBC 8.7 04/10/2018  1648   RBC 4.41 04/10/2018 1648   HGB 13.6 04/10/2018 1655   HGB 14.1 01/27/2018 1637   HCT 40.0 04/10/2018 1655   HCT 43.2 01/27/2018 1637   PLT 283 04/10/2018 1648   PLT 352 01/27/2018 1637   MCV 90.2 04/10/2018 1648   MCV 93 01/27/2018 1637   MCH 30.6 04/10/2018 1648   MCHC 33.9 04/10/2018 1648   RDW 14.7 04/10/2018 1648   RDW 16.1 (H) 01/27/2018 1637   LYMPHSABS 4.1 (H) 04/10/2018 1648   MONOABS 0.9 04/10/2018 1648   EOSABS 0.2 04/10/2018 1648   BASOSABS 0.0 04/10/2018 1648  CMP     Component Value Date/Time   NA 137 04/10/2018 1655   NA 144 01/27/2018 1637   K 4.8 04/10/2018 1655   CL 110 04/10/2018 1655   CO2 18 (L) 04/10/2018 1648   GLUCOSE 110 (H) 04/10/2018 1655   BUN 47 (H) 04/10/2018 1655   BUN 16 01/27/2018 1637   CREATININE 1.70 (H) 04/10/2018 1655   CREATININE 1.05 02/24/2015 1601   CALCIUM 9.3 04/10/2018 1648   PROT 7.6 04/10/2018 1648   PROT 7.6 01/27/2018 1637   ALBUMIN 4.1 04/10/2018 1648   ALBUMIN 4.2 01/27/2018 1637   AST 26 04/10/2018 1648   ALT 12 04/10/2018 1648   ALKPHOS 102 04/10/2018 1648   BILITOT 0.4 04/10/2018 1648   BILITOT 0.4 01/27/2018 1637   GFRNONAA 41 (L) 04/10/2018 1648   GFRNONAA 80 02/24/2015 1601   GFRAA 48 (L) 04/10/2018 1648   GFRAA >89 02/24/2015 1601    Lipid Panel     Component Value Date/Time   CHOL 221 (H) 01/27/2018 1637   TRIG 312 (H) 01/27/2018 1637   HDL 39 (L) 01/27/2018 1637   CHOLHDL 5.7 (H) 01/27/2018 1637   CHOLHDL 5.4 12/11/2015 0609   VLDL 35 12/11/2015 0609   LDLCALC 120 (H) 01/27/2018 1637     Imaging I have reviewed the images obtained:  CT-scan of the brain IMPRESSION: 1. Mild bifrontal involutional changes of the brain slightly advanced in appearance for age. 2. No acute intracranial abnormality. 3. Tiny chronic left thalamic lacunar infarct.   MRI/MRA examination of the brain IMPRESSION: 1. No acute intracranial abnormality. 2. Stable mild chronic microvascular ischemic  changes and volume loss of the brain. Small chronic lacunar infarct in the left thalamus. 3. No large vessel occlusion of the anterior or posterior intracranial circulation. 4. Stable intracranial atherosclerosis with multiple areas of stenosis, severe in the bilateral proximal MCA.     Assessment: 57 year old male who has suffered from a 10-minute TIA with symptoms including expressive aphasia, facial droop.  Work-up thus far shown a negative MRI, MRA showing areas of severe stenosis in bilateral proximal MCAs, LDL of 120.    Recommendations: - A1c - Cardiac echo new - Carotid Dopplers - Continue aspirin while in hospital -PT/OT -Stroke swallow screen if not already occurred -Blood pressure control    @SIGNATUREAA @

## 2018-04-12 ENCOUNTER — Inpatient Hospital Stay (HOSPITAL_COMMUNITY): Payer: PPO

## 2018-04-12 DIAGNOSIS — G459 Transient cerebral ischemic attack, unspecified: Secondary | ICD-10-CM

## 2018-04-12 LAB — LUPUS ANTICOAGULANT PANEL
DRVVT: 38.8 s (ref 0.0–47.0)
PTT Lupus Anticoagulant: 38.9 s (ref 0.0–51.9)

## 2018-04-12 LAB — CARDIOLIPIN ANTIBODIES, IGG, IGM, IGA
Anticardiolipin IgA: <9 APL U/mL (ref 0–11)
Anticardiolipin IgG: <9 GPL U/mL (ref 0–14)
Anticardiolipin IgM: <9 MPL U/mL (ref 0–12)

## 2018-04-12 LAB — RAPID URINE DRUG SCREEN, HOSP PERFORMED
Amphetamines: NOT DETECTED
BENZODIAZEPINES: NOT DETECTED
Barbiturates: NOT DETECTED
Cocaine: NOT DETECTED
OPIATES: NOT DETECTED
Tetrahydrocannabinol: POSITIVE — AB

## 2018-04-12 LAB — BASIC METABOLIC PANEL
ANION GAP: 9 (ref 5–15)
BUN: 27 mg/dL — ABNORMAL HIGH (ref 6–20)
CALCIUM: 9.3 mg/dL (ref 8.9–10.3)
CO2: 19 mmol/L — AB (ref 22–32)
Chloride: 108 mmol/L (ref 98–111)
Creatinine, Ser: 1.24 mg/dL (ref 0.61–1.24)
GLUCOSE: 133 mg/dL — AB (ref 70–99)
POTASSIUM: 4.4 mmol/L (ref 3.5–5.1)
Sodium: 136 mmol/L (ref 135–145)

## 2018-04-12 LAB — GLUCOSE, CAPILLARY
GLUCOSE-CAPILLARY: 98 mg/dL (ref 70–99)
Glucose-Capillary: 114 mg/dL — ABNORMAL HIGH (ref 70–99)

## 2018-04-12 LAB — PROTEIN S ACTIVITY: Protein S Activity: 114 % (ref 63–140)

## 2018-04-12 LAB — PROTEIN S, TOTAL: Protein S Ag, Total: 102 % (ref 60–150)

## 2018-04-12 LAB — BETA-2-GLYCOPROTEIN I ABS, IGG/M/A
Beta-2 Glyco I IgG: <9 GPI IgG units (ref 0–20)
Beta-2-Glycoprotein I IgA: <9 GPI IgA units (ref 0–25)
Beta-2-Glycoprotein I IgM: <9 GPI IgM units (ref 0–32)

## 2018-04-12 LAB — PROTEIN C ACTIVITY: Protein C Activity: 142 % (ref 73–180)

## 2018-04-12 LAB — HOMOCYSTEINE: Homocysteine: 22.6 umol/L — ABNORMAL HIGH (ref 0.0–15.0)

## 2018-04-12 MED ORDER — FUROSEMIDE 40 MG PO TABS: 40.0000 mg | ORAL_TABLET | ORAL | 0 refills | Status: DC

## 2018-04-12 MED ORDER — ASPIRIN EC 81 MG PO TBEC: 81.0000 mg | DELAYED_RELEASE_TABLET | Freq: Every day | ORAL | 0 refills | Status: DC

## 2018-04-12 MED ORDER — CLOPIDOGREL BISULFATE 75 MG PO TABS: 75.0000 mg | ORAL_TABLET | Freq: Every day | ORAL | 0 refills | Status: DC

## 2018-04-12 MED ORDER — POTASSIUM CHLORIDE CRYS ER 20 MEQ PO TBCR: 20.0000 meq | EXTENDED_RELEASE_TABLET | ORAL | 0 refills | Status: DC

## 2018-04-12 MED ORDER — CLOPIDOGREL BISULFATE 75 MG PO TABS: 75.0000 mg | ORAL_TABLET | Freq: Every day | ORAL | Status: DC

## 2018-04-12 NOTE — Progress Notes (Signed)
EEG completed; results pending.    

## 2018-04-12 NOTE — Discharge Summary (Addendum)
Discharge Summary  [name] ZOX:[id number] DOB: 1960-09-10  PCP: Marcine Matar, MD  Admit date: 04/10/2018 Discharge date: 04/12/2018  Time spent: , more than 50% time spent on coordination of care  Recommendations for Outpatient Follow-up:  1. F/u with PMD within a week  for hospital discharge follow up, repeat cbc/bmp at follow up 2. F/u with cardiology/outpatient event monitor for 30days 3.   F/u with neurology "Stable intracranial atherosclerosis with multiple areas of stenosis, severe in the bilateral proximal MCA" 4. New meds: plavix 5.  To take asa daily for three months together with plavix,  Then plavix alone 6. meds change: decrease lasix along with potassium  from daily to mwf  Discharge Diagnoses:  Active Hospital Problems   Diagnosis Date Noted  . TIA (transient ischemic attack) 04/10/2018  . ARF (acute renal failure) (HCC) 04/10/2018  . Type 2 diabetes mellitus without complication, without long-term current use of insulin (HCC)   . Benign essential HTN     Resolved Hospital Problems  No resolved problems to display.    Discharge Condition: stable  Diet recommendation: heart healthy/carb modified  Filed Weights   04/10/18 1637 04/10/18 2131  Weight: 99.8 kg 97.7 kg    History of present illness: (per admitting MD Dr Selena Batten) [name]  is a 57 y.o. male, w hypertension, hyperlipidemia, dm2, CAD s/p CABG  CVA (without residual deficit), had dysarthria , slurred speech and right facial droop and difficulty with gait stability according to his daughter starting about 4pm.  Lasting about 5-10 minutes.  Pt was brought to ED for evaluation. Per ED, neurology consulted and recommended transfer to [facility].    Hospital Course:  Principal Problem:   TIA (transient ischemic attack) Active Problems:   Benign essential HTN   Type 2 diabetes mellitus without complication, without long-term current use of insulin (HCC)   ARF (acute renal failure)  (HCC)    TIA vs. Pre-syncope - source unclear, need to rule out afib --He reports h/o CVA post cardiac surgery in 2017, no residual deficit from prior cva. --Reports recent stress due to he buried his wife last week ---Presenting Symptom resolved   MRI no acute infarct  MRA diffuse atherosclerosis with bilateral MCA stenosis  Carotid Doppler unremarkable  2D Echo EF 65 to 70%  EEG normal  Hypercoagulable work-up negative so far  UDS positive for [facility] Neurology consulted, recommended 30day cardiac event monitoring, start DAPT for three months then plavix alone. He is discharged on statin.  AKI on CKDII Home meds lisinopril/lasix/metformin held in the hospital Renal US no acute findings He received gentle hydration, Bun/cr on presentation is 44/1.74, bun /cr at discharge is 27/1.24, close to baseline Metformin and lisinopril resumed at discharge. Lasix dose decreased He is to follow up with pmd to repeat bmp.   Chronic diastolic chf Presented with dehydration Lasix held in the hospital, he received hydration Lasix dose decreased from daily to qmwf at discharge   H/o Gout: Stable, no acute issues Resume home meds allopurionl  HLD ldl 120, he is started on statin  noninsulin dependent dm2 a1c 5.9 Continue home meds metformin  Cholelithiasis Asymptomatic  Incidental  finding on renal US  H/ oCAD s/p CABGx4 in 3/ 2017 H/o aortic valve insufficiency s/p aortic valve replacement in 11/2015  Bioprosthetic Valve. Implant Model Number:[id number], Size:23, Unique Device Identifier:[id number] He has not followed up with cardiology or cardiothoracic surgery since 12/2015.   Patient insists that he has had ICD placement at time of  the surgery, he could not remember who his cardiologist is. Per chart review/procedure note He never had ICD placed. He denies chest pain. He is advised to follow up with his cardiologist. He states he does not know how to contact his cardiologist. I  have sent a message to cardiology office to set up follow up appointment, I have also provided cardiology office contact number to the patient.   He does not appear to understand his medication regiment, he prefers his daughter takes care of this. He asked me to talk to his daughter. I have called his daughter and explained the diagnosis,  medical treatment and follow up plan prior to discharge. Daughter expressed understanding and appreciation.     Procedures:  EEG  Consultations:  neurology  Discharge Exam: BP (!) 168/106 (BP Location: Right Arm)   Pulse 80   Temp 98.3 F (36.8 C) (Oral)   Resp 20   Ht 5\' 6"  (1.676 m)   Wt 97.7 kg   SpO2 99%   BMI 34.76 kg/m   General: NAD Cardiovascular: RRR Respiratory: CTABL  Discharge Instructions You were cared for by a hospitalist during your hospital stay. If you have any questions about your discharge medications or the care you received while you were in the hospital after you are discharged, you can call the unit and asked to speak with the hospitalist on call if the hospitalist that took care of you is not available. Once you are discharged, your primary care physician will handle any further medical issues. Please note that NO REFILLS for any discharge medications will be authorized once you are discharged, as it is imperative that you return to your primary care physician (or establish a relationship with a primary care physician if you do not have one) for your aftercare needs so that they can reassess your need for medications and monitor your lab values.  Discharge Instructions    AMB Referral to [facility] Care Management   Complete by:  As directed    Patient is 57 year old male with history of hypertension, dyslipidemia, type 2 diabetes mellitus, coronary artery disease andhistory of CVA. Patient was admitted for sudden dysarthria, slurred speech and right facial droop and blurred vision (TIA- transient ischemic  attack).  Patient's recent hemoglobin A1cis 5.9,taking Metformin at homeand managing DM with exercise (walking), monitoring/ recording blood sugar daily, follow-up with provider and "trying to eat right".   He admits that his blood pressure is slightly high on a regular basis and would need to continue blood pressure control with Metoprolol 25 mg BID and trying to adhere with diet.   He expressed willingness to learn information through phone calls.Patientverbally agreed and [facility] for blood pressure control diet reinforcement,further education, gain knowledge/ information on further disease management ofhealth conditions (DM and HTN)at home.    Reason for consult:  [facility], blood pressure control, further education and information inmanaging health conditions (HTN/ DM) after discharge.   Diagnoses of:   Diabetes Other     Other Diagnosis:  Hypertension   Expected date of contact:  1-3 days (reserved for hospital discharges)   Diet - low sodium heart healthy   Complete by:  As directed    Carb modified diet   Increase activity slowly   Complete by:  As directed      Allergies as of 04/12/2018      Reactions   Other Anaphylaxis   mushrooms   Hydrocodone-acetaminophen Itching  Medication List    STOP taking these medications   Castellani Paint 1.5 % Liqd   cyclobenzaprine 10 MG tablet Commonly known as:  FLEXERIL   fluconazole 150 MG tablet Commonly known as:  DIFLUCAN     TAKE these medications   allopurinol 300 MG tablet Commonly known as:  ZYLOPRIM Take 1 tablet (300 mg total) by mouth daily.   aspirin EC 81 MG tablet Take 1 tablet (81 mg total) by mouth daily. Please stop taking asa after three month. What changed:  additional instructions   atorvastatin 80 MG tablet Commonly known as:  LIPITOR TAKE ONE TABLET BY MOUTH ONCE DAILY AT  6  PM   clopidogrel 75 MG tablet Commonly  known as:  PLAVIX Take 1 tablet (75 mg total) by mouth daily.   fluticasone 50 MCG/ACT nasal spray Commonly known as:  FLONASE Place 2 sprays into both nostrils daily.   folic acid 1 MG tablet Commonly known as:  FOLVITE TAKE 1 TABLET BY MOUTH ONCE DAILY   furosemide 40 MG tablet Commonly known as:  LASIX Take 1 tablet (40 mg total) by mouth every Monday, Wednesday, and Friday. What changed:  when to take this   lisinopril 5 MG tablet Commonly known as:  PRINIVIL,ZESTRIL Take 1 tablet (5 mg total) by mouth daily.   loratadine 10 MG tablet Commonly known as:  CLARITIN Take 1 tablet (10 mg total) by mouth daily.   metFORMIN 500 MG tablet Commonly known as:  GLUCOPHAGE Take 1 tablet (500 mg total) by mouth daily with breakfast.   metoprolol tartrate 25 MG tablet Commonly known as:  LOPRESSOR TAKE 1 TABLET BY MOUTH TWICE DAILY   potassium chloride SA 20 MEQ tablet Commonly known as:  K-DUR,KLOR-CON Take 1 tablet (20 mEq total) by mouth every Monday, Wednesday, and Friday. What changed:  when to take this      Allergies  Allergen Reactions  . Other Anaphylaxis    mushrooms  . Hydrocodone-Acetaminophen Itching   Follow-up Information    Marcine Matar, MD Follow up.   Specialty:  Internal Medicine Why:  hospital discharge follow up, repeat cbc/bmp at follow up Contact information: [address] [place] [address] [phone]        [facility] [address] [facility] Follow up in 1 week(s).   Specialty:  Cardiology Why:  follow up  cardiology office will also set up heart monitor for you. Contact information: [address] [place] [address] [phone]       [facility]. Schedule an appointment as soon as possible for a visit in 4 week(s).   Contact information: [address]     [address] [place] [address] [phone]           The results of significant  diagnostics from this hospitalization (including imaging, microbiology, ancillary and laboratory) are listed below for reference.    Significant Diagnostic Studies: Dg Chest 2 View  Result Date: 04/10/2018 CLINICAL DATA:  TIA.  Loss of balance.  Slurred speech. EXAM: CHEST - 2 VIEW COMPARISON:  March 20, 2017 FINDINGS: A tubular density in the periphery of the left upper lobe correlates with a mucous filled dilated airway seen on the CT scan from March 20, 2017. The finding is more conspicuous on today's chest x-ray compared to the March 20, 2017 x-ray suggesting the possibility of mild interval worsening. No pneumothorax. The cardiomediastinal silhouette is stable. No other acute abnormalities. IMPRESSION: A  tubular density in the left upper lung peripherally likely correlates with the mucus plugged dilated airway in this region on the CT scan from March 20, 2017. The finding is more prominent on today's chest x-ray than the chest x-ray from March 20, 2017 suggesting the possibility of mild worsening. No other acute abnormalities. Electronically Signed   By: Gerome Sam III M.D   On: 04/10/2018 18:36   Ct Head Wo Contrast  Result Date: 04/10/2018 CLINICAL DATA:  Dysarthria, trouble with balance and right-sided facial droop EXAM: CT HEAD WITHOUT CONTRAST TECHNIQUE: Contiguous axial images were obtained from the base of the skull through the vertex without intravenous contrast. COMPARISON:  CT 09/24/2003, MRI 12/11/2015 FINDINGS: Brain: Tiny chronic left thalamic lacunar infarct. The small bifrontal involutional changes of the brain, slightly advanced for age. No hydrocephalus. No acute intracranial hemorrhage, large vascular territory infarct, midline shift or edema. No intra-axial mass nor extra-axial fluid collections. Patent cisterns. Vascular: No hyperdense vessel sign. Atherosclerosis noted of the cavernous sinuses. Skull: Normal. Negative for fracture or focal lesion. Sinuses/Orbits: No acute finding.  Other: None IMPRESSION: 1. Mild bifrontal involutional changes of the brain slightly advanced in appearance for age. 2. No acute intracranial abnormality. 3. Tiny chronic left thalamic lacunar infarct. Electronically Signed   By: Tollie Eth M.D.   On: 04/10/2018 17:42   Mr Brain Wo Contrast  Result Date: 04/11/2018 CLINICAL DATA:  57 y/o M; transient episode of difficulty speaking, imbalance, right-sided facial droop. TIA, initial exam. EXAM: MRI HEAD WITHOUT CONTRAST MRA HEAD WITHOUT CONTRAST TECHNIQUE: Multiplanar, multiecho pulse sequences of the brain and surrounding structures were obtained without intravenous contrast. Angiographic images of the head were obtained using MRA technique without contrast. COMPARISON:  04/10/2018 CT head. 12/11/2015 CTA head and neck. 12/11/2015 MRI and MRA of the head. FINDINGS: MRI HEAD FINDINGS Brain: No acute infarction, hemorrhage, hydrocephalus, extra-axial collection or mass lesion. Few stable nonspecific T2 FLAIR hyperintensities in subcortical and periventricular white matter are compatible with mild chronic microvascular ischemic changes for age. Mild stable brain volume loss. Small chronic lacunar infarct within the left thalamus. Vascular: As below. Skull and upper cervical spine: Normal marrow signal. Sinuses/Orbits: Negative. Other: None. MRA HEAD FINDINGS Internal carotid arteries: Mild left cavernous and terminal ICA stenosis. Moderate right paraclinoid stenosis. Anterior cerebral arteries:  Moderate right proximal A2 stenosis. Middle cerebral arteries: Severe left M1 long segment of stenosis. Severe right M1 tandem segments of stenosis. Anterior communicating artery: Patent. Posterior communicating arteries:  Patent. Posterior cerebral arteries: Multiple areas of stenosis, moderate in the P2 segments bilaterally. Patent bilateral vertebral arteries and the basilar artery. Basilar artery:  Patent. Vertebral arteries:  Patent. No additional area of high-grade  stenosis, aneurysm, or vascular malformation identified. IMPRESSION: 1. No acute intracranial abnormality. 2. Stable mild chronic microvascular ischemic changes and volume loss of the brain. Small chronic lacunar infarct in the left thalamus. 3. No large vessel occlusion of the anterior or posterior intracranial circulation. 4. Stable intracranial atherosclerosis with multiple areas of stenosis, severe in the bilateral proximal MCA. Electronically Signed   By: Mitzi Hansen M.D.   On: 04/11/2018 04:15   US Renal  Result Date: 04/10/2018 CLINICAL DATA:  Acute onset of renal failure. EXAM: RENAL / URINARY TRACT ULTRASOUND COMPLETE COMPARISON:  CT of the abdomen and pelvis performed 03/20/2017 FINDINGS: Right Kidney: Length: 11.2 cm. Echogenicity within normal limits. No mass or hydronephrosis visualized. Left Kidney: Length: 9.7 cm. Echogenicity within normal limits. No mass or hydronephrosis visualized. Bladder:  Appears normal for degree of bladder distention. Bilateral ureteral jets are visualized. Incidental note is made of stones within the gallbladder. IMPRESSION: 1. No evidence of hydronephrosis. 2. Cholelithiasis incidentally noted. Gallbladder otherwise grossly unremarkable. Electronically Signed   By: Roanna Raider M.D.   On: 04/10/2018 21:33   Mr Maxine Glenn Head Wo Contrast  Result Date: 04/11/2018 CLINICAL DATA:  57 y/o M; transient episode of difficulty speaking, imbalance, right-sided facial droop. TIA, initial exam. EXAM: MRI HEAD WITHOUT CONTRAST MRA HEAD WITHOUT CONTRAST TECHNIQUE: Multiplanar, multiecho pulse sequences of the brain and surrounding structures were obtained without intravenous contrast. Angiographic images of the head were obtained using MRA technique without contrast. COMPARISON:  04/10/2018 CT head. 12/11/2015 CTA head and neck. 12/11/2015 MRI and MRA of the head. FINDINGS: MRI HEAD FINDINGS Brain: No acute infarction, hemorrhage, hydrocephalus, extra-axial collection or  mass lesion. Few stable nonspecific T2 FLAIR hyperintensities in subcortical and periventricular white matter are compatible with mild chronic microvascular ischemic changes for age. Mild stable brain volume loss. Small chronic lacunar infarct within the left thalamus. Vascular: As below. Skull and upper cervical spine: Normal marrow signal. Sinuses/Orbits: Negative. Other: None. MRA HEAD FINDINGS Internal carotid arteries: Mild left cavernous and terminal ICA stenosis. Moderate right paraclinoid stenosis. Anterior cerebral arteries:  Moderate right proximal A2 stenosis. Middle cerebral arteries: Severe left M1 long segment of stenosis. Severe right M1 tandem segments of stenosis. Anterior communicating artery: Patent. Posterior communicating arteries:  Patent. Posterior cerebral arteries: Multiple areas of stenosis, moderate in the P2 segments bilaterally. Patent bilateral vertebral arteries and the basilar artery. Basilar artery:  Patent. Vertebral arteries:  Patent. No additional area of high-grade stenosis, aneurysm, or vascular malformation identified. IMPRESSION: 1. No acute intracranial abnormality. 2. Stable mild chronic microvascular ischemic changes and volume loss of the brain. Small chronic lacunar infarct in the left thalamus. 3. No large vessel occlusion of the anterior or posterior intracranial circulation. 4. Stable intracranial atherosclerosis with multiple areas of stenosis, severe in the bilateral proximal MCA. Electronically Signed   By: Mitzi Hansen M.D.   On: 04/11/2018 04:15    Microbiology: No results found for this or any previous visit (from the past 240 hour(s)).   Labs: Basic Metabolic Panel: Recent Labs  Lab 04/10/18 1648 04/10/18 1655 04/12/18 0932  NA 139 137 136  K 4.7 4.8 4.4  CL 111 110 108  CO2 18*  --  19*  GLUCOSE 112* 110* 133*  BUN 44* 47* 27*  CREATININE 1.74* 1.70* 1.24  CALCIUM 9.3  --  9.3   Liver Function Tests: Recent Labs  Lab  04/10/18 1648  AST 26  ALT 12  ALKPHOS 102  BILITOT 0.4  PROT 7.6  ALBUMIN 4.1   No results for input(s): LIPASE, AMYLASE in the last 168 hours. No results for input(s): AMMONIA in the last 168 hours. CBC: Recent Labs  Lab 04/10/18 1648 04/10/18 1655  WBC 8.7  --   NEUTROABS 3.5  --   HGB 13.5 13.6  HCT 39.8 40.0  MCV 90.2  --   PLT 283  --    Cardiac Enzymes: No results for input(s): CKTOTAL, CKMB, CKMBINDEX, TROPONINI in the last 168 hours. BNP: BNP (last 3 results) No results for input(s): BNP in the last 8760 hours.  ProBNP (last 3 results) No results for input(s): PROBNP in the last 8760 hours.  CBG: Recent Labs  Lab 04/11/18 1152 04/11/18 1625 04/11/18 2108 04/12/18 0630 04/12/18 1113  GLUCAP 123* 127* 121* 98 114*  Signed:  Albertine Grates MD, PhD  [facility] 04/14/2018, 9:48 PM

## 2018-04-12 NOTE — Procedures (Signed)
History: 57 yo M being evaluated for TIA.   Sedation: None  Technique: This is a 21 channel routine scalp EEG performed at the bedside with bipolar and monopolar montages arranged in accordance to the international 10/20 system of electrode placement. One channel was dedicated to EKG recording.    Background: The background consists of intermixed alpha and beta activities. There is a well defined posterior dominant rhythm of 8 Hz that attenuates with eye opening. There is anterior shifting of the PDR associated with drowsiness. No sleep was recorded.   Photic stimulation: Physiologic driving is not performed  EEG Abnormalities: none  Clinical Interpretation: This normal EEG is recorded in the waking and drowsy state. There was no seizure or seizure predisposition recorded on this study. Please note that a normal EEG does not preclude the possibility of epilepsy.   Ritta Slot, MD [facility] [phone]  If 7pm- 7am, please page neurology on call as listed in AMION.

## 2018-04-12 NOTE — Care Management Note (Signed)
Case Management Note  Patient Details  Name: [name] MRN: [id number] Date of Birth: 06-Oct-1960  Subjective/Objective:      Pt in with TIA. He is from home with family.              Action/Plan: No f/u per PT/OT and no DME needs. Pt has PCP: Dr Laural Benes, and Insurance: HTA and transportation home.   Expected Discharge Date:  04/12/18               Expected Discharge Plan:  Home/Self Care  In-House Referral:     Discharge planning Services     Post Acute Care Choice:    Choice offered to:     DME Arranged:    DME Agency:     HH Arranged:    HH Agency:     Status of Service:  Completed, signed off  If discussed at [employer] of Stay Meetings, dates discussed:    Additional Comments:  Kermit Balo, RN 04/12/2018, 2:59 PM

## 2018-04-12 NOTE — Progress Notes (Signed)
Discharge instructions reviewed with patient/family. All questions answered at this time. Transport home by family.  [place], RN 

## 2018-04-12 NOTE — Progress Notes (Signed)
*  Preliminary Results* Carotid artery duplex has been completed. Bilateral internal carotid arteries are 1-39%. Vertebral arteries are patent with antegrade flow.  04/12/2018 3:31 PM  [name] [name]

## 2018-04-12 NOTE — Progress Notes (Addendum)
STROKE TEAM PROGRESS NOTE   SUBJECTIVE (INTERVAL HISTORY) No family is at the bedside.  Overall he feels his condition is completely resolved. He recounted HPI with me. He stated that he had recent stress as he buried his wife last week. He was with [employer] tech at home yesterday and her daughter found him to have right facial droop. He then found he had blurry vision from both eyes. He also found his speech was slurry and he could not talk and words did not make sense.  And he said down for a little bit and then try to stand up but his knees buckled and he had to sit down again.  And then he felt stomachache with nausea and lightheadedness.  Episode lasts 10 minutes and resolved.  He has appointment with cardiology tomorrow.  He had open heart surgery in March 2017 and he denies any recent heart or heart palpitation.  However, he did not check his blood pressure at the time of event.   OBJECTIVE Temp:  [97.7 F (36.5 C)-98.3 F (36.8 C)] 98.1 F (36.7 C) (08/07 1115) Pulse Rate:  [68-83] 75 (08/07 1115) Cardiac Rhythm: Normal sinus rhythm (08/07 0742) Resp:  [16-20] 16 (08/07 0358) BP: (104-163)/(75-104) 163/104 (08/07 1115) SpO2:  [97 %-100 %] 98 % (08/07 1115)  Recent Labs  Lab 04/11/18 1152 04/11/18 1625 04/11/18 2108 04/12/18 0630 04/12/18 1113  GLUCAP 123* 127* 121* 98 114*   Recent Labs  Lab 04/10/18 1648 04/10/18 1655 04/12/18 0932  NA 139 137 136  K 4.7 4.8 4.4  CL 111 110 108  CO2 18*  --  19*  GLUCOSE 112* 110* 133*  BUN 44* 47* 27*  CREATININE 1.74* 1.70* 1.24  CALCIUM 9.3  --  9.3   Recent Labs  Lab 04/10/18 1648  AST 26  ALT 12  ALKPHOS 102  BILITOT 0.4  PROT 7.6  ALBUMIN 4.1   Recent Labs  Lab 04/10/18 1648 04/10/18 1655  WBC 8.7  --   NEUTROABS 3.5  --   HGB 13.5 13.6  HCT 39.8 40.0  MCV 90.2  --   PLT 283  --    No results for input(s): CKTOTAL, CKMB, CKMBINDEX, TROPONINI in the last 168 hours. Recent Labs    04/10/18 1648  LABPROT 13.0   INR 0.99   No results for input(s): COLORURINE, LABSPEC, PHURINE, GLUCOSEU, HGBUR, BILIRUBINUR, KETONESUR, PROTEINUR, UROBILINOGEN, NITRITE, LEUKOCYTESUR in the last 72 hours.  Invalid input(s): APPERANCEUR     Component Value Date/Time   CHOL 221 (H) 01/27/2018 1637   TRIG 312 (H) 01/27/2018 1637   HDL 39 (L) 01/27/2018 1637   CHOLHDL 5.7 (H) 01/27/2018 1637   CHOLHDL 5.4 12/11/2015 0609   VLDL 35 12/11/2015 0609   LDLCALC 120 (H) 01/27/2018 1637   Lab Results  Component Value Date   HGBA1C 5.9 (A) 01/23/2018      Component Value Date/Time   LABOPIA NONE DETECTED 04/12/2018 0421   COCAINSCRNUR NONE DETECTED 04/12/2018 0421   LABBENZ NONE DETECTED 04/12/2018 0421   AMPHETMU NONE DETECTED 04/12/2018 0421   THCU POSITIVE (A) 04/12/2018 0421   LABBARB NONE DETECTED 04/12/2018 0421    No results for input(s): ETH in the last 168 hours.  I have personally reviewed the radiological images below and agree with the radiology interpretations.  Dg Chest 2 View  Result Date: 04/10/2018 CLINICAL DATA:  TIA.  Loss of balance.  Slurred speech. EXAM: CHEST - 2 VIEW COMPARISON:  March 20, 2017  FINDINGS: A tubular density in the periphery of the left upper lobe correlates with a mucous filled dilated airway seen on the CT scan from March 20, 2017. The finding is more conspicuous on today's chest x-ray compared to the March 20, 2017 x-ray suggesting the possibility of mild interval worsening. No pneumothorax. The cardiomediastinal silhouette is stable. No other acute abnormalities. IMPRESSION: A tubular density in the left upper lung peripherally likely correlates with the mucus plugged dilated airway in this region on the CT scan from March 20, 2017. The finding is more prominent on today's chest x-ray than the chest x-ray from March 20, 2017 suggesting the possibility of mild worsening. No other acute abnormalities. Electronically Signed   By: Gerome Sam III M.D   On: 04/10/2018 18:36   Ct Head  Wo Contrast  Result Date: 04/10/2018 CLINICAL DATA:  Dysarthria, trouble with balance and right-sided facial droop EXAM: CT HEAD WITHOUT CONTRAST TECHNIQUE: Contiguous axial images were obtained from the base of the skull through the vertex without intravenous contrast. COMPARISON:  CT 09/24/2003, MRI 12/11/2015 FINDINGS: Brain: Tiny chronic left thalamic lacunar infarct. The small bifrontal involutional changes of the brain, slightly advanced for age. No hydrocephalus. No acute intracranial hemorrhage, large vascular territory infarct, midline shift or edema. No intra-axial mass nor extra-axial fluid collections. Patent cisterns. Vascular: No hyperdense vessel sign. Atherosclerosis noted of the cavernous sinuses. Skull: Normal. Negative for fracture or focal lesion. Sinuses/Orbits: No acute finding. Other: None IMPRESSION: 1. Mild bifrontal involutional changes of the brain slightly advanced in appearance for age. 2. No acute intracranial abnormality. 3. Tiny chronic left thalamic lacunar infarct. Electronically Signed   By: Tollie Eth M.D.   On: 04/10/2018 17:42   Mr Brain Wo Contrast  Result Date: 04/11/2018 CLINICAL DATA:  57 y/o M; transient episode of difficulty speaking, imbalance, right-sided facial droop. TIA, initial exam. EXAM: MRI HEAD WITHOUT CONTRAST MRA HEAD WITHOUT CONTRAST TECHNIQUE: Multiplanar, multiecho pulse sequences of the brain and surrounding structures were obtained without intravenous contrast. Angiographic images of the head were obtained using MRA technique without contrast. COMPARISON:  04/10/2018 CT head. 12/11/2015 CTA head and neck. 12/11/2015 MRI and MRA of the head. FINDINGS: MRI HEAD FINDINGS Brain: No acute infarction, hemorrhage, hydrocephalus, extra-axial collection or mass lesion. Few stable nonspecific T2 FLAIR hyperintensities in subcortical and periventricular white matter are compatible with mild chronic microvascular ischemic changes for age. Mild stable brain volume  loss. Small chronic lacunar infarct within the left thalamus. Vascular: As below. Skull and upper cervical spine: Normal marrow signal. Sinuses/Orbits: Negative. Other: None. MRA HEAD FINDINGS Internal carotid arteries: Mild left cavernous and terminal ICA stenosis. Moderate right paraclinoid stenosis. Anterior cerebral arteries:  Moderate right proximal A2 stenosis. Middle cerebral arteries: Severe left M1 long segment of stenosis. Severe right M1 tandem segments of stenosis. Anterior communicating artery: Patent. Posterior communicating arteries:  Patent. Posterior cerebral arteries: Multiple areas of stenosis, moderate in the P2 segments bilaterally. Patent bilateral vertebral arteries and the basilar artery. Basilar artery:  Patent. Vertebral arteries:  Patent. No additional area of high-grade stenosis, aneurysm, or vascular malformation identified. IMPRESSION: 1. No acute intracranial abnormality. 2. Stable mild chronic microvascular ischemic changes and volume loss of the brain. Small chronic lacunar infarct in the left thalamus. 3. No large vessel occlusion of the anterior or posterior intracranial circulation. 4. Stable intracranial atherosclerosis with multiple areas of stenosis, severe in the bilateral proximal MCA. Electronically Signed   By: Mitzi Hansen M.D.   On: 04/11/2018  04:15   US Renal  Result Date: 04/10/2018 CLINICAL DATA:  Acute onset of renal failure. EXAM: RENAL / URINARY TRACT ULTRASOUND COMPLETE COMPARISON:  CT of the abdomen and pelvis performed 03/20/2017 FINDINGS: Right Kidney: Length: 11.2 cm. Echogenicity within normal limits. No mass or hydronephrosis visualized. Left Kidney: Length: 9.7 cm. Echogenicity within normal limits. No mass or hydronephrosis visualized. Bladder: Appears normal for degree of bladder distention. Bilateral ureteral jets are visualized. Incidental note is made of stones within the gallbladder. IMPRESSION: 1. No evidence of hydronephrosis. 2.  Cholelithiasis incidentally noted. Gallbladder otherwise grossly unremarkable. Electronically Signed   By: Roanna Raider M.D.   On: 04/10/2018 21:33   Mr Maxine Glenn Head Wo Contrast  Result Date: 04/11/2018 CLINICAL DATA:  57 y/o M; transient episode of difficulty speaking, imbalance, right-sided facial droop. TIA, initial exam. EXAM: MRI HEAD WITHOUT CONTRAST MRA HEAD WITHOUT CONTRAST TECHNIQUE: Multiplanar, multiecho pulse sequences of the brain and surrounding structures were obtained without intravenous contrast. Angiographic images of the head were obtained using MRA technique without contrast. COMPARISON:  04/10/2018 CT head. 12/11/2015 CTA head and neck. 12/11/2015 MRI and MRA of the head. FINDINGS: MRI HEAD FINDINGS Brain: No acute infarction, hemorrhage, hydrocephalus, extra-axial collection or mass lesion. Few stable nonspecific T2 FLAIR hyperintensities in subcortical and periventricular white matter are compatible with mild chronic microvascular ischemic changes for age. Mild stable brain volume loss. Small chronic lacunar infarct within the left thalamus. Vascular: As below. Skull and upper cervical spine: Normal marrow signal. Sinuses/Orbits: Negative. Other: None. MRA HEAD FINDINGS Internal carotid arteries: Mild left cavernous and terminal ICA stenosis. Moderate right paraclinoid stenosis. Anterior cerebral arteries:  Moderate right proximal A2 stenosis. Middle cerebral arteries: Severe left M1 long segment of stenosis. Severe right M1 tandem segments of stenosis. Anterior communicating artery: Patent. Posterior communicating arteries:  Patent. Posterior cerebral arteries: Multiple areas of stenosis, moderate in the P2 segments bilaterally. Patent bilateral vertebral arteries and the basilar artery. Basilar artery:  Patent. Vertebral arteries:  Patent. No additional area of high-grade stenosis, aneurysm, or vascular malformation identified. IMPRESSION: 1. No acute intracranial abnormality. 2. Stable  mild chronic microvascular ischemic changes and volume loss of the brain. Small chronic lacunar infarct in the left thalamus. 3. No large vessel occlusion of the anterior or posterior intracranial circulation. 4. Stable intracranial atherosclerosis with multiple areas of stenosis, severe in the bilateral proximal MCA. Electronically Signed   By: Mitzi Hansen M.D.   On: 04/11/2018 04:15   Carotid Doppler   Bilateral internal carotid arteries are 1-39%. Vertebral arteries are patent with antegrade flow.  TTE  - Prior tests and procedures: 23 mm magna ease bioprosthetic aortic   valve 11/25/15. - Left ventricle: The cavity size was normal. Wall thickness was   normal. Systolic function was vigorous. The estimated ejection   fraction was in the range of 65% to 70%. Wall motion was normal;   there were no regional wall motion abnormalities. Doppler   parameters are consistent with abnormal left ventricular   relaxation (grade 1 diastolic dysfunction). - Aortic valve: A bioprosthesis was present and functioning   normally. There was trivial regurgitation. - Mitral valve: There was trivial regurgitation. - Left atrium: The atrium was mildly dilated. - Right ventricle: Pacer wire or catheter noted in right ventricle.  Impressions:  - No cardiac source of emboli was indentified. Compared to the   prior study, there has been no significant interval change.  EEG This normal EEG is recorded in the waking and  drowsy state. There was no seizure or seizure predisposition recorded on this study. Please note that a normal EEG does not preclude the possibility of epilepsy.    PHYSICAL EXAM  Temp:  [97.7 F (36.5 C)-98.3 F (36.8 C)] 98.1 F (36.7 C) (08/07 1115) Pulse Rate:  [68-83] 75 (08/07 1115) Resp:  [16-20] 16 (08/07 0358) BP: (104-163)/(75-104) 163/104 (08/07 1115) SpO2:  [97 %-100 %] 98 % (08/07 1115)  General - Well nourished, well developed, in no apparent  distress.  Ophthalmologic - fundi not visualized due to noncooperation.  Cardiovascular - Regular rate and rhythm.  Mental Status -  Level of arousal and orientation to time, place, and person were intact. Language including expression, naming, repetition, comprehension was assessed and found intact. Fund of Knowledge was assessed and was intact.  Cranial Nerves II - XII - II - Visual field intact OU. III, IV, VI - Extraocular movements intact. V - Facial sensation intact bilaterally. VII - Facial movement intact bilaterally. VIII - Hearing & vestibular intact bilaterally. X - Palate elevates symmetrically. XI - Chin turning & shoulder shrug intact bilaterally. XII - Tongue protrusion intact.  Motor Strength - The patient's strength was normal in all extremities and pronator drift was absent.  Bulk was normal and fasciculations were absent.   Motor Tone - Muscle tone was assessed at the neck and appendages and was normal.  Reflexes - The patient's reflexes were symmetrical in all extremities and he had no pathological reflexes.  Sensory - Light touch, temperature/pinprick were assessed and were symmetrical.    Coordination - The patient had normal movements in the hands and feet with no ataxia or dysmetria.  Tremor was absent.  Gait and Station - deferred.   ASSESSMENT/PLAN [name] is a 57 y.o. male with history of CAD/MI status post CABG and AVR in 11/2015, HLD, HTN, diabetes and stroke in 12/2015 admitted for episode of facial droop, speech difficulty, generalized weakness, stomach ache and nausea with lightheadedness. No tPA given due to resolution of symptoms.    Left brain TIA vs. Pre-syncope - source unclear, need to rule out afib  Resultant back to baseline  MRI no acute infarct  MRA diffuse atherosclerosis with bilateral MCA stenosis  Carotid Doppler unremarkable  2D Echo EF 65 to 70%  EEG normal  LDL 120 01/2018  HgbA1c 5.9 01/2018  Hypercoagulable  work-up negative so far  UDS positive for THC  Lovenox for VTE prophylaxis  Recommend 30-day cardiac event monitoring with cardiology to rule out A. fib  aspirin 81 mg daily prior to admission, now on aspirin 325 mg daily and clopidogrel 75 mg daily.  Continue DAPT for 3 months and then Plavix alone.  Patient counseled to be compliant with his antithrombotic medications  Ongoing aggressive stroke risk factor management  Therapy recommendations: Pending  Disposition: Pending  Hx of stroke  12/2015 admitted for right-sided numbness for 2 weeks post CABG MRI showed left thalamic infarct.  MRA left PCA severe stenosis, moderate right MCA and severe left MCA stenosis.  Carotid Doppler negative.  TTE unremarkable.  LDL 85 and A1c 6.3.  Aspirin switch to Plavix.  Also discharged on Lipitor 80.  Diabetes  HgbA1c 5.9 in 01/2018 goal < 7.0  Controlled  CBG monitoring  SSI  DM education and close PCP follow up  Hypertension Stable  Long term BP goal normotensive  Hyperlipidemia  Home meds: Lipitor 80  LDL 120, goal < 70  Now on Lipitor 80  Continue  statin at discharge  Other Stroke Risk Factors  Obesity, Body mass index is 34.76 kg/m.   Coronary artery disease / MI s/p CABG and AVR in 11/2015  Other Active Problems  AKI -creatinine 1.74-> 1.22   Mildly elevated homocystine  Continue cardiology follow-up  Hospital day # 2  Neurology will sign off. Please call with questions. Pt will follow up with stroke clinic NP at [facility] in about 4 weeks. Thanks for the consult.   Marvel Plan, MD PhD Stroke Neurology 04/12/2018 2:51 PM    To contact Stroke Continuity provider, please refer to [email or web address]. After hours, contact General Neurology

## 2018-04-12 NOTE — Consult Note (Signed)
            [facility] CM Primary Care Navigator  04/12/2018  [name] 29-Aug-1961 [id number]    Went to see patient in the room to identify possible discharge needs. Patient reports that he "could hardly see and can't speak right"; had developed sudden dysarthria, slurred speech and right facial droop thathadledto this admission. (TIA- transient ischemic attack)  Patient endorses Dr.Deborah Laural Benes [facility] as his[facility] provider.   [facility] [place] [facility].  [facility] use of "pill box" systemfilled once a week.  Patient states that daughter [name]) and son [name]) have beenprovidingtransportation to his doctors' appointments.  Patient mentioned that his son, daughter and family lives with him and will serve as his primary caregivers after discharge.   Anticipated discharge plan still pending base on neurology workup per MD note, but patient hopes to discharge home with daughter and son's assistance.  Patient voiced understanding to call primary care provider's office whenhe gets backhome for a post discharge follow-up appointment within1- 2 weeksor sooner if needed.Patient letter (with PCP's contact number) wasprovided as a reminder.  Patient is 57 year old male with history of hypertension, dyslipidemia, type 2 diabetes mellitus, coronary artery disease andhistory of CVA. Patient reports burying his wife over the weekend.  Patient's recent hemoglobin A1cis 5.9,taking Metformin at homeand managing DM with exercise (walking), monitoring/ recording blood sugar daily, follow-up with provider and "trying to eat right".   He admits that his blood pressure is slightly high on a regular basis and would need to continue blood pressure control with metoprolol 25 mg BID and trying to adhere with  diet.   Explained to patientregardingTHN care management servicesand resourcesthat is available and he had voicedinterest aboutit.He expressed willingness to learn information through phone calls.Patientverbally agreed and opted for referralto [facility] for further education, diet reinforcement,information on further disease management ofhealth conditions (DM and HTN)after discharge.  Referral made to [facility] Coachforblood pressure control, diet reinforcement, further education, gain knowledge and further information inmanagement of health conditions (HTN/ DM)at home.  THNcare managementinformation provided for future needs thathemay have.   For additional questions please contact:  Karin Golden A. Chalsey Leeth, BSN, RN-BC [facility] PRIMARY CARE Navigator Cell: [phone]

## 2018-04-13 ENCOUNTER — Encounter: Payer: Self-pay | Admitting: *Deleted

## 2018-04-13 ENCOUNTER — Ambulatory Visit: Payer: PPO | Attending: Internal Medicine | Admitting: Internal Medicine

## 2018-04-13 ENCOUNTER — Encounter: Payer: Self-pay | Admitting: Internal Medicine

## 2018-04-13 ENCOUNTER — Other Ambulatory Visit: Payer: Self-pay | Admitting: Cardiology

## 2018-04-13 VITALS — BP 143/96 | HR 79 | Temp 98.4°F | Resp 16 | Wt 227.6 lb

## 2018-04-13 DIAGNOSIS — Z8673 Personal history of transient ischemic attack (TIA), and cerebral infarction without residual deficits: Secondary | ICD-10-CM | POA: Insufficient documentation

## 2018-04-13 DIAGNOSIS — Z5189 Encounter for other specified aftercare: Secondary | ICD-10-CM | POA: Diagnosis not present

## 2018-04-13 DIAGNOSIS — Z952 Presence of prosthetic heart valve: Secondary | ICD-10-CM | POA: Insufficient documentation

## 2018-04-13 DIAGNOSIS — E785 Hyperlipidemia, unspecified: Secondary | ICD-10-CM | POA: Insufficient documentation

## 2018-04-13 DIAGNOSIS — Z951 Presence of aortocoronary bypass graft: Secondary | ICD-10-CM | POA: Diagnosis not present

## 2018-04-13 DIAGNOSIS — D649 Anemia, unspecified: Secondary | ICD-10-CM | POA: Insufficient documentation

## 2018-04-13 DIAGNOSIS — I2510 Atherosclerotic heart disease of native coronary artery without angina pectoris: Secondary | ICD-10-CM | POA: Insufficient documentation

## 2018-04-13 DIAGNOSIS — E119 Type 2 diabetes mellitus without complications: Secondary | ICD-10-CM | POA: Insufficient documentation

## 2018-04-13 DIAGNOSIS — Z8249 Family history of ischemic heart disease and other diseases of the circulatory system: Secondary | ICD-10-CM | POA: Insufficient documentation

## 2018-04-13 DIAGNOSIS — I10 Essential (primary) hypertension: Secondary | ICD-10-CM | POA: Insufficient documentation

## 2018-04-13 DIAGNOSIS — K219 Gastro-esophageal reflux disease without esophagitis: Secondary | ICD-10-CM | POA: Diagnosis not present

## 2018-04-13 DIAGNOSIS — G459 Transient cerebral ischemic attack, unspecified: Secondary | ICD-10-CM

## 2018-04-13 DIAGNOSIS — Z7984 Long term (current) use of oral hypoglycemic drugs: Secondary | ICD-10-CM | POA: Insufficient documentation

## 2018-04-13 DIAGNOSIS — Z885 Allergy status to narcotic agent status: Secondary | ICD-10-CM | POA: Diagnosis not present

## 2018-04-13 DIAGNOSIS — Z79899 Other long term (current) drug therapy: Secondary | ICD-10-CM | POA: Insufficient documentation

## 2018-04-13 DIAGNOSIS — Z7902 Long term (current) use of antithrombotics/antiplatelets: Secondary | ICD-10-CM | POA: Diagnosis not present

## 2018-04-13 DIAGNOSIS — N289 Disorder of kidney and ureter, unspecified: Secondary | ICD-10-CM

## 2018-04-13 DIAGNOSIS — Z89021 Acquired absence of right finger(s): Secondary | ICD-10-CM | POA: Diagnosis not present

## 2018-04-13 LAB — GLUCOSE, POCT (MANUAL RESULT ENTRY): POC GLUCOSE: 122 mg/dL — AB (ref 70–99)

## 2018-04-13 LAB — PROTEIN C, TOTAL: Protein C, Total: 111 % (ref 60–150)

## 2018-04-13 NOTE — Progress Notes (Signed)
Patient ID: [name], male    DOB: 01/31/1961  MRN: [id number]  CC: Hospitalization Follow-up   Subjective: [name] is a 57 y.o. male who presents for hosp f/u. Daughter, [name].  His concerns today include:  DM, HTN, HL, CAD s/p CABG and AVR 2017, anemia  Hospital discharge was not completed at the time of this visit for review.   Patient was hospitalized 8/5-03/2018 with TIA and acute renal failure.  Patient had presented with history of trouble speaking maintaining balance and right facial droop that lasted about 5 to 10 minutes.  By the time he presented to the emergency room his neurologic exam did not reveal any deficits.  Echo revealed EF 65-70% Wall motion was normal, grade 1 diastolic dysfunction, aortic valve bioprosthesis was functioning normally.  No cardiac source of emboli was identified.  Carotid Dopplers revealed no significant stenosis.  CAT scan of the head revealed no acute abnormalities. MRI of the brain revealed no acute intracranial abnormalities, small chronic lacunar infarct on the left thalamus and stable intracranial atherosclerosis with multiple areas of stenosis severe in the bilateral proximal MCA.  Patient discharged home on aspirin and Plavix.  Told to see cardiology for placement of the monitor.  Also told to follow-up with neurology. -Found to have acute renal insufficiency with creatinine of 1.74 BUN of 44.  Furosemide dose was decreased to 40 mg 3 times a week.  Potassium supplement was also changed to 3 times a week.  By the time of discharge creatinine had decreased to 1.2 with estimated GFR greater than 60.  Daughter is requesting to have FMLA form completed for her which she will drop off later this week.  She will be off up to and including 04/14/2018 to help her dad.  Patient brings all med bottles with him today.  He did not take any of his medicines.  He is confused as to which medicines he should still be taking since hospital discharge.   He denies any further episodes of slurred speech or weakness.   Patient Active Problem List   Diagnosis Date Noted  . TIA (transient ischemic attack) 04/10/2018  . ARF (acute renal failure) (HCC) 04/10/2018  . Colon cancer screening 05/04/2017  . Brodie's abscess of thoracic spine (HCC) 06/24/2016  . CVA (cerebral infarction) 12/11/2015  . Acute ischemic VBA thalamic stroke (HCC)   . Atherosclerosis of native coronary artery of native heart without angina pectoris   . H/O heart bypass surgery   . Benign essential HTN   . Type 2 diabetes mellitus without complication, without long-term current use of insulin (HCC)   . S/P AVR 11/25/2015  . CAD (coronary artery disease)   . Chronic periodontitis 11/21/2015  . Bicuspid aortic valve   . AKI (acute kidney injury) (HCC)   . Controlled type 2 diabetes mellitus with diabetic nephropathy, without long-term current use of insulin (HCC)   . Essential hypertension 02/24/2015  . Dyslipidemia 02/24/2015  . GERD (gastroesophageal reflux disease) 05/02/2013  . Primary gout 05/02/2013  . Allergic rhinitis due to allergen 07/09/2010     Current Outpatient Medications on File Prior to Visit  Medication Sig Dispense Refill  . allopurinol (ZYLOPRIM) 300 MG tablet Take 1 tablet (300 mg total) by mouth daily. 90 tablet 3  . aspirin EC 81 MG tablet Take 1 tablet (81 mg total) by mouth daily. Please stop taking asa after three month. 90 tablet 0  . atorvastatin (LIPITOR) 80 MG tablet TAKE ONE  TABLET BY MOUTH ONCE DAILY AT  6  PM 30 tablet 3  . clopidogrel (PLAVIX) 75 MG tablet Take 1 tablet (75 mg total) by mouth daily. 30 tablet 0  . fluticasone (FLONASE) 50 MCG/ACT nasal spray Place 2 sprays into both nostrils daily. 16 g 6  . folic acid (FOLVITE) 1 MG tablet TAKE 1 TABLET BY MOUTH ONCE DAILY 90 tablet 3  . furosemide (LASIX) 40 MG tablet Take 1 tablet (40 mg total) by mouth every Monday, Wednesday, and Friday. 30 tablet 0  . ibuprofen (ADVIL,MOTRIN)  600 MG tablet Take 1 tablet (600 mg total) by mouth every 8 (eight) hours as needed. 15 tablet 0  . lisinopril (PRINIVIL,ZESTRIL) 5 MG tablet Take 1 tablet (5 mg total) by mouth daily. 30 tablet 3  . loratadine (CLARITIN) 10 MG tablet Take 1 tablet (10 mg total) by mouth daily. 30 tablet 11  . metFORMIN (GLUCOPHAGE) 500 MG tablet Take 1 tablet (500 mg total) by mouth daily with breakfast. 30 tablet 3  . metoprolol tartrate (LOPRESSOR) 25 MG tablet TAKE 1 TABLET BY MOUTH TWICE DAILY 180 tablet 3  . potassium chloride SA (KLOR-CON M20) 20 MEQ tablet Take 1 tablet (20 mEq total) by mouth every Monday, Wednesday, and Friday. 30 tablet 0   No current facility-administered medications on file prior to visit.     Allergies  Allergen Reactions  . Other Anaphylaxis    mushrooms  . Hydrocodone-Acetaminophen Itching    Social History   Socioeconomic History  . Marital status: Married    Spouse name: Not on file  . Number of children: Not on file  . Years of education: Not on file  . Highest education level: Not on file  Occupational History  . Not on file  Social Needs  . Financial resource strain: Not on file  . Food insecurity:    Worry: Not on file    Inability: Not on file  . Transportation needs:    Medical: Not on file    Non-medical: Not on file  Tobacco Use  . Smoking status: Never Smoker  . Smokeless tobacco: Never Used  Substance and Sexual Activity  . Alcohol use: Yes    Comment: Drinks beer once a week  . Drug use: Yes    Types: Marijuana    Comment: last smoked weed 2 months ago  . Sexual activity: Yes  Lifestyle  . Physical activity:    Days per week: Not on file    Minutes per session: Not on file  . Stress: Not on file  Relationships  . Social connections:    Talks on phone: Not on file    Gets together: Not on file    Attends religious service: Not on file    Active member of club or organization: Not on file    Attends meetings of clubs or organizations:  Not on file    Relationship status: Not on file  . Intimate partner violence:    Fear of current or ex partner: Not on file    Emotionally abused: Not on file    Physically abused: Not on file    Forced sexual activity: Not on file  Other Topics Concern  . Not on file  Social History Narrative  . Not on file    Family History  Problem Relation Age of Onset  . Diabetes Father   . Hypertension Father   . Hypertension Mother   . Hypertension Sister   . Hypertension Brother   .  Hypertension Brother   . Hypertension Brother   . Colon cancer Neg Hx   . Esophageal cancer Neg Hx   . Rectal cancer Neg Hx   . Stomach cancer Neg Hx   . Colon polyps Neg Hx     Past Surgical History:  Procedure Laterality Date  . AORTIC VALVE REPLACEMENT N/A 11/25/2015   Procedure: AORTIC VALVE REPLACEMENT (AVR) USING A MAGNA EASE ;  Surgeon: Alleen Borne, MD;  Location: [facility] OR;  Service: Open Heart Surgery;  Laterality: N/A;  . CARDIAC CATHETERIZATION N/A 11/19/2015   Procedure: Right/Left Heart Cath and Coronary Angiography;  Surgeon: Lyn Records, MD;  Location: [facility] INVASIVE CV LAB;  Service: Cardiovascular;  Laterality: N/A;  . COLONOSCOPY  2004  . CORONARY ARTERY BYPASS GRAFT N/A 11/25/2015   Procedure: CORONARY ARTERY BYPASS GRAFTING (CABG)x4 LIMA-LAD; SVG-OM; SVG-RCA; SVG-DIAG;  Surgeon: Alleen Borne, MD;  Location: [facility] OR;  Service: Open Heart Surgery;  Laterality: N/A;  . CYST REMOVAL TRUNK N/A 08/24/2016   Procedure: EXCISION OF BACK ABSCESS;  Surgeon: Jimmye Norman, MD;  Location: [facility];  Service: General;  Laterality: N/A;  . FINGER AMPUTATION Right 1980's   3rd & 4th digits "got them mashed" (01/17/2013)  . KNEE ARTHROSCOPY Right 1980's  . MULTIPLE EXTRACTIONS WITH ALVEOLOPLASTY Bilateral 11/21/2015   Procedure: Extraction of tooth #'s 2,12 with alveoloplasty and gross debridement of remaining dentition;  Surgeon: Charlynne Pander, DDS;  Location: [facility] OR;  Service: Oral  Surgery;  Laterality: Bilateral;  . TEE WITHOUT CARDIOVERSION N/A 11/20/2015   Procedure: TRANSESOPHAGEAL ECHOCARDIOGRAM (TEE);  Surgeon: Laurey Morale, MD;  Location: [facility] ENDOSCOPY;  Service: Cardiovascular;  Laterality: N/A;  . TEE WITHOUT CARDIOVERSION N/A 11/25/2015   Procedure: TRANSESOPHAGEAL ECHOCARDIOGRAM (TEE);  Surgeon: Alleen Borne, MD;  [facility] OR;  Service: Open Heart Surgery;  Laterality: N/A;    ROS: Review of Systems Negative except as stated above PHYSICAL EXAM: BP (!) 143/96 (BP Location: Left Arm, Cuff Size: Normal) Comment: recheck  Pulse 79   Temp 98.4 F (36.9 C) (Oral)   Resp 16   Wt 227 lb 9.6 oz (103.2 kg)   SpO2 97%   BMI 36.74 kg/m   Physical Exam  General appearance - alert, well appearing, and in no distress Mental status - normal mood, behavior, speech, dress, motor activity, and thought processes Chest - clear to auscultation, no wheezes, rales or rhonchi, symmetric air entry Heart - normal rate, regular rhythm, normal S1, S2, 2/6 SEM LSB Neurological -nonfocal Extremities -no lower extremity edema  Results for orders placed or performed in visit on 04/13/18  POCT glucose (manual entry)  Result Value Ref Range   POC Glucose 122 (A) 70 - 99 mg/dl   Lab Results  Component Value Date   HGBA1C 5.9 (A) 01/23/2018     ASSESSMENT AND PLAN: 1. TIA (transient ischemic attack) Continue aspirin and Plavix.  After 3 weeks he will continue with just Plavix.  Referral submitted to cardiology for monitor placement - Ambulatory referral to Cardiology - Ambulatory referral to Neurology  2. Essential hypertension Not at goal.  He has not taken medicines as yet.  I went over his medicines with him.  He will continue lisinopril and metoprolol.  3. Renal insufficiency Improved back to baseline. We will continue with furosemide 3 days a week. Restart metformin.  Recheck BMP on follow-up visit.  4. Type 2 diabetes mellitus without complication,  without long-term current use of insulin (  HCC) Continue low-dose metformin - POCT glucose (manual entry) - Microalbumin / creatinine urine ratio   Patient was given the opportunity to ask questions.  Patient verbalized understanding of the plan and was able to repeat key elements of the plan.   Orders Placed This Encounter  Procedures  . Microalbumin / creatinine urine ratio  . POCT glucose (manual entry)     Requested Prescriptions    No prescriptions requested or ordered in this encounter    No follow-ups on file.  Jonah Blue, MD, FACP

## 2018-04-13 NOTE — Patient Instructions (Signed)
Take Aspirin and Plavix daily for 3 weeks then just Plavix.

## 2018-04-14 ENCOUNTER — Other Ambulatory Visit: Payer: Self-pay

## 2018-04-14 LAB — MICROALBUMIN / CREATININE URINE RATIO
CREATININE, UR: 217.9 mg/dL
Microalb/Creat Ratio: 1440.6 mg/g creat — ABNORMAL HIGH (ref 0.0–30.0)
Microalbumin, Urine: 3139.1 ug/mL

## 2018-04-14 NOTE — Patient Outreach (Addendum)
[facility]) Care Management  [facility]   04/14/2018  Conlin Lebeau 1961-03-19 [id number]  57 year old male outreached by [facility] services for 30 day post discharge medication review.  PMHx includes, but not limited to, hypertension, coronary artery disease, hypertension, transient ischemic attack, GERD, Type 2 diabetes mellitus, h/o CVA, gout and dyslipidemia.  Successful outreach attempt to [name].  HIPAA identifiers  Verified.  Subjective: Mr. Sexson reports that he is feeling well after his recent hospitalization for TIA.  He reports that he believes he took two days of his  medications by accident within a few hours of each other and that was what caused recent issues.  He states that his daughter is helping him with his medications and the filling of his pill box.  He states that he has no affordability issues with his prescriptions.  He reports that he checks his CBGs daily with his usual ~ 120 mg/dL.  Objective:  SCr 1.24 mg/dL W[facility]8N 2.7% on 7/82/42  Current Medications: Current Outpatient Medications  Medication Sig Dispense Refill  . allopurinol (ZYLOPRIM) 300 MG tablet Take 1 tablet (300 mg total) by mouth daily. 90 tablet 3  . aspirin EC 81 MG tablet Take 1 tablet (81 mg total) by mouth daily. Please stop taking asa after three month. 90 tablet 0  . atorvastatin (LIPITOR) 80 MG tablet TAKE ONE TABLET BY MOUTH ONCE DAILY AT  6  PM 30 tablet 3  . clopidogrel (PLAVIX) 75 MG tablet Take 1 tablet (75 mg total) by mouth daily. 30 tablet 0  . fluticasone (FLONASE) 50 MCG/ACT nasal spray Place 2 sprays into both nostrils daily. 16 g 6  . folic acid (FOLVITE) 1 MG tablet TAKE 1 TABLET BY MOUTH ONCE DAILY 90 tablet 3  . furosemide (LASIX) 40 MG tablet Take 1 tablet (40 mg total) by mouth every Monday, Wednesday, and Friday. 30 tablet 0  . lisinopril (PRINIVIL,ZESTRIL) 5 MG tablet Take 1 tablet (5 mg total) by mouth daily. 30 tablet 3  . loratadine  (CLARITIN) 10 MG tablet Take 1 tablet (10 mg total) by mouth daily. 30 tablet 11  . metFORMIN (GLUCOPHAGE) 500 MG tablet Take 1 tablet (500 mg total) by mouth daily with breakfast. 30 tablet 3  . metoprolol tartrate (LOPRESSOR) 25 MG tablet TAKE 1 TABLET BY MOUTH TWICE DAILY 180 tablet 3  . potassium chloride SA (KLOR-CON M20) 20 MEQ tablet Take 1 tablet (20 mEq total) by mouth every Monday, Wednesday, and Friday. 30 tablet 0   No current facility-administered medications for this visit.     Functional Status: In your present state of health, do you have any difficulty performing the following activities: 04/10/2018  Hearing? N  Vision? N  Difficulty concentrating or making decisions? N  Walking or climbing stairs? N  Dressing or bathing? N  Doing errands, shopping? N  Some recent data might be hidden    Fall/Depression Screening: Fall Risk  01/27/2018 06/25/2016 12/15/2015  Falls in the past year? No No No  Number falls in past yr: - - -  Injury with Fall? - - -  Risk for fall due to : - - -  Follow up - - -   PHQ 2/9 Scores 01/27/2018 11/03/2016 06/25/2016 04/01/2016 02/26/2016 12/15/2015 04/16/2015  PHQ - 2 Score 0 0 1 0 0 0 0  PHQ- 9 Score - - 6 - - - -   ASSESSMENT: Date Discharged from Hospital: 04/12/18 Date Medication Reconciliation Performed:  04/14/2018  Medications Discontinued at Discharge: (Delete if not applicable)  Cyclobenzaprine  Fluconazole  Castellani [employer] (Phenol like chloraseptic)  New Medications at Discharge:   Furosemide (dose changed)  Potassium (dose changed)  Plavix  Patient was recently discharged from hospital and all medications have been reviewed  Drugs sorted by system:  Neurologic/Psychologic:  Cardiovascular: aspirin 81 mg, atorvastatin, clopidogrel, furosemide, lisinopril, metoprolol tartrate  Pulmonary/Allergy: fluticasone, loratadine  Endocrine: metformin (hold) per PCP note 04/13/18  Vitamins/Minerals: folic acid, potassium  chloride  Miscellaneous: allopurinol  Plan: Route note to PCP, Dr. Laural Benes.  Berlin Hun, PharmD Clinical Pharmacist [facility] [phone]

## 2018-04-17 LAB — PROTHROMBIN GENE MUTATION

## 2018-04-17 LAB — FACTOR 5 LEIDEN

## 2018-04-25 ENCOUNTER — Ambulatory Visit (INDEPENDENT_AMBULATORY_CARE_PROVIDER_SITE_OTHER): Payer: PPO

## 2018-04-25 ENCOUNTER — Other Ambulatory Visit: Payer: Self-pay | Admitting: Cardiology

## 2018-04-25 DIAGNOSIS — I4891 Unspecified atrial fibrillation: Secondary | ICD-10-CM

## 2018-04-25 DIAGNOSIS — G459 Transient cerebral ischemic attack, unspecified: Secondary | ICD-10-CM | POA: Diagnosis not present

## 2018-04-25 DIAGNOSIS — I639 Cerebral infarction, unspecified: Secondary | ICD-10-CM

## 2018-04-26 ENCOUNTER — Other Ambulatory Visit: Payer: Self-pay | Admitting: *Deleted

## 2018-04-26 NOTE — Patient Outreach (Signed)
[facility]) Care Management  04/26/2018  Oz Tavani [birth year] [id number]   RN Health Coach telephone call to patient.  Hipaa compliance verified. Per patient he was getting ready to go eat.   Plan: RN will call patient again within 10 business days.  Gean Maidens BSN RN [facility] Care [facility] [phone]

## 2018-05-10 NOTE — Progress Notes (Signed)
[facility] [address] [place]. [place] [address] [phone]       OFFICE FOLLOW UP NOTE  [name] Date of Birth:  05-22-1961 Medical Record Number:  [id number]   Reason for Referral:  hospital stroke follow up  CHIEF COMPLAINT:  Chief Complaint  Patient presents with  . Follow-up    Hospital follow up room in back pt is alone, pt states he does not drive    HPI: [name] is being seen today for initial visit in the office for TIA versus presyncope on 04/10/2018. History obtained from patient and chart review. Reviewed all radiology images and labs personally.  [name] is a 57 y.o. male with history of CAD/MI status post CABG and AVR in 11/2015, HLD, HTN, diabetes and stroke in 12/2015 who was admitted for episode of facial droop, speech difficulty, generalized weakness, stomach ache and nausea with lightheadedness. No tPA given due to resolution of symptoms.   MRI head reviewed was negative for acute infarct.  MRA showed diffuse arthrosclerosis with bilateral MCA stenosis.  Carotid Doppler unremarkable.  2D echo showing EF of 65 to 70%.  EEG normal.  LDL 120 and his patient was on Lipitor 80 mg PTA recommended to continue this at discharge.  HTN stable during admission and recommended long-term BP goal normotensive range.  A1c 5.9 and recommended continued follow-up with PCP for diabetic management.  Hypercoagulable work-up negative.  UDS was positive for THC.  Recommended DAPT for 3 months and Plavix alone.  30-day cardiac monitor recommended for cardiology to rule out atrial fibrillation as potential cause of stroke.  Patient was discharged home in stable condition.  Patient is being seen today for hospital follow-up.  He states overall he has been doing well without residual symptoms or deficits.  He does have cardiac monitor in place which was started on 04/25/2018 and he will continue this for 30 days.  He has continued taking both aspirin and  Plavix without side effects of bleeding or bruising.  Continues to take Lipitor without side effects myalgias.  Blood pressure today 149/94.  Patient does monitor this at home and states it does fluctuate frequently where it can be higher despite compliance with medication use.  Patient did state on the day of his stroke, he did take 2 days worth of his blood pressure medications but did not realize this until he was discharged from hospital and returned home.  He believes having lower blood pressure could have contributed to his TIA especially with bilateral MCA stenosis.  Patient does endorse a history of snoring along with daytime fatigue and denies past work-up of OSA.  Patient states he has been exercising more frequently and trying to maintain a healthy diet.  Denies new or worsening stroke/TIA symptoms.   ROS:   14 system review of systems performed and negative with exception of no complaints  PMH:  Past Medical History:  Diagnosis Date  . Allergy   . Anemia   . Anxiety   . Baker's cyst of knee   . Blood transfusion without reported diagnosis    as baby   . Coronary artery disease    quadruple bypass - March 2016  . GERD (gastroesophageal reflux disease)   . Gouty arthritis    "real bad" (01/17/2013)  . Heart murmur   . Hypercholesteremia   . Hypertension   . Myocardial infarction (HCC) 2017  . Stroke (HCC)   . Type II diabetes mellitus (HCC)  PSH:  Past Surgical History:  Procedure Laterality Date  . AORTIC VALVE REPLACEMENT N/A 11/25/2015   Procedure: AORTIC VALVE REPLACEMENT (AVR) USING A MAGNA EASE ;  Surgeon: Alleen Borne, MD;  Location: [facility] OR;  Service: Open Heart Surgery;  Laterality: N/A;  . CARDIAC CATHETERIZATION N/A 11/19/2015   Procedure: Right/Left Heart Cath and Coronary Angiography;  Surgeon: Lyn Records, MD;  Location[facility] INVASIVE CV LAB;  Service: Cardiovascular;  Laterality: N/A;  . COLONOSCOPY  2004  . CORONARY ARTERY BYPASS GRAFT N/A 11/25/2015     Procedure: CORONARY ARTERY BYPASS GRAFTING (CABG)x4 LIMA-LAD; SVG-OM; SVG-RCA; SVG-DIAG;  Surgeon: Alleen Borne, MD;  Location: [facility] OR;  Service: Open Heart Surgery;  Laterality: N/A;  . CYST REMOVAL TRUNK N/A 08/24/2016   Procedure: EXCISION OF BACK ABSCESS;  Surgeon: Jimmye Norman, MD;  Location: [facility];  Service: General;  Laterality: N/A;  . FINGER AMPUTATION Right 1980's   3rd & 4th digits "got them mashed" (01/17/2013)  . KNEE ARTHROSCOPY Right 1980's  . MULTIPLE EXTRACTIONS WITH ALVEOLOPLASTY Bilateral 11/21/2015   Procedure: Extraction of tooth #'s 2,12 with alveoloplasty and gross debridement of remaining dentition;  Surgeon: Charlynne Pander, DDS;  Location: [facility] OR;  Service: Oral Surgery;  Laterality: Bilateral;  . TEE WITHOUT CARDIOVERSION N/A 11/20/2015   Procedure: TRANSESOPHAGEAL ECHOCARDIOGRAM (TEE);  Surgeon: Laurey Morale, MD;  Location: [facility] ENDOSCOPY;  Service: Cardiovascular;  Laterality: N/A;  . TEE WITHOUT CARDIOVERSION N/A 11/25/2015   Procedure: TRANSESOPHAGEAL ECHOCARDIOGRAM (TEE);  Surgeon: Alleen Borne, MD;  Location: [facility] OR;  Service: Open Heart Surgery;  Laterality: N/A;    Social History:  Social History   Socioeconomic History  . Marital status: Married    Spouse name: Not on file  . Number of children: Not on file  . Years of education: Not on file  . Highest education level: Not on file  Occupational History  . Not on file  Social Needs  . Financial resource strain: Not on file  . Food insecurity:    Worry: Not on file    Inability: Not on file  . Transportation needs:    Medical: Not on file    Non-medical: Not on file  Tobacco Use  . Smoking status: Never Smoker  . Smokeless tobacco: Never Used  Substance and Sexual Activity  . Alcohol use: Yes    Comment: occasionally  . Drug use: Yes    Types: Marijuana    Comment: pt states occasionally  . Sexual activity: Yes  Lifestyle  . Physical activity:    Days per week: Not  on file    Minutes per session: Not on file  . Stress: Not on file  Relationships  . Social connections:    Talks on phone: Not on file    Gets together: Not on file    Attends religious service: Not on file    Active member of club or organization: Not on file    Attends meetings of clubs or organizations: Not on file    Relationship status: Not on file  . Intimate partner violence:    Fear of current or ex partner: Not on file    Emotionally abused: Not on file    Physically abused: Not on file    Forced sexual activity: Not on file  Other Topics Concern  . Not on file  Social History Narrative  . Not on file    Family History:  Family History  Problem  Relation Age of Onset  . Diabetes Father   . Hypertension Father   . Hypertension Mother   . Hypertension Sister   . Hypertension Brother   . Hypertension Brother   . Hypertension Brother   . Colon cancer Neg Hx   . Esophageal cancer Neg Hx   . Rectal cancer Neg Hx   . Stomach cancer Neg Hx   . Colon polyps Neg Hx     Medications:   Current Outpatient Medications on File Prior to Visit  Medication Sig Dispense Refill  . allopurinol (ZYLOPRIM) 300 MG tablet Take 1 tablet (300 mg total) by mouth daily. 90 tablet 3  . aspirin EC 81 MG tablet Take 1 tablet (81 mg total) by mouth daily. Please stop taking asa after three month. 90 tablet 0  . atorvastatin (LIPITOR) 80 MG tablet TAKE ONE TABLET BY MOUTH ONCE DAILY AT  6  PM 30 tablet 3  . clopidogrel (PLAVIX) 75 MG tablet Take 1 tablet (75 mg total) by mouth daily. 30 tablet 0  . fluticasone (FLONASE) 50 MCG/ACT nasal spray Place 2 sprays into both nostrils daily. 16 g 6  . folic acid (FOLVITE) 1 MG tablet TAKE 1 TABLET BY MOUTH ONCE DAILY 90 tablet 3  . furosemide (LASIX) 40 MG tablet Take 1 tablet (40 mg total) by mouth every Monday, Wednesday, and Friday. 30 tablet 0  . lisinopril (PRINIVIL,ZESTRIL) 5 MG tablet Take 1 tablet (5 mg total) by mouth daily. 30 tablet 3  .  loratadine (CLARITIN) 10 MG tablet Take 1 tablet (10 mg total) by mouth daily. 30 tablet 11  . metFORMIN (GLUCOPHAGE) 500 MG tablet Take 1 tablet (500 mg total) by mouth daily with breakfast. 30 tablet 3  . metoprolol tartrate (LOPRESSOR) 25 MG tablet TAKE 1 TABLET BY MOUTH TWICE DAILY 180 tablet 3  . potassium chloride SA (KLOR-CON M20) 20 MEQ tablet Take 1 tablet (20 mEq total) by mouth every Monday, Wednesday, and Friday. 30 tablet 0   No current facility-administered medications on file prior to visit.     Allergies:   Allergies  Allergen Reactions  . Other Anaphylaxis    mushrooms  . Hydrocodone-Acetaminophen Itching     Physical Exam  Vitals:   05/11/18 1045  BP: (!) 149/94  Pulse: 71  Weight: 233 lb (105.7 kg)  Height: 5\' 3"  (1.6 m)   Body mass index is 41.27 kg/m. No exam data present  General: Obese pleasant middle-aged African-American male, seated, in no evident distress Head: head normocephalic and atraumatic.   Neck: supple with no carotid or supraclavicular bruits Cardiovascular: regular rate and rhythm, no murmurs Musculoskeletal: no deformity Skin:  no rash/petichiae Vascular:  Normal pulses all extremities  Neurologic Exam Mental Status: Awake and fully alert. Oriented to place and time. Recent and remote memory intact. Attention span, concentration and fund of knowledge appropriate. Mood and affect appropriate.  Cranial Nerves: Fundoscopic exam reveals sharp disc margins. Pupils equal, briskly reactive to light. Extraocular movements full without nystagmus. Visual fields full to confrontation. Hearing intact. Facial sensation intact. Face, tongue, palate moves normally and symmetrically.  Motor: Normal bulk and tone. Normal strength in all tested extremity muscles. Sensory.: intact to touch , pinprick , position and vibratory sensation.  Coordination: Rapid alternating movements normal in all extremities. Finger-to-nose and heel-to-shin performed  accurately bilaterally. Gait and Station: Arises from chair without difficulty. Stance is normal. Gait demonstrates normal stride length and balance . Able to heel, toe and tandem walk  without difficulty.  Reflexes: 1+ and symmetric. Toes downgoing.    NIHSS  0 Modified Rankin  0   Diagnostic Data (Labs, Imaging, Testing)  CT HEAD WO CONTRAST 04/10/2018 IMPRESSION: 1. Mild bifrontal involutional changes of the brain slightly advanced in appearance for age. 2. No acute intracranial abnormality. 3. Tiny chronic left thalamic lacunar infarct.  MR BRAIN WO CONTRAST 04/11/2018 IMPRESSION: 1. No acute intracranial abnormality. 2. Stable mild chronic microvascular ischemic changes and volume loss of the brain. Small chronic lacunar infarct in the left thalamus. 3. No large vessel occlusion of the anterior or posterior intracranial circulation. 4. Stable intracranial atherosclerosis with multiple areas of stenosis, severe in the bilateral proximal MCA.  MR MRA HEAD  04/11/2018 IMPRESSION: 1. No acute intracranial abnormality. 2. Stable mild chronic microvascular ischemic changes and volume loss of the brain. Small chronic lacunar infarct in the left thalamus. 3. No large vessel occlusion of the anterior or posterior intracranial circulation. 4. Stable intracranial atherosclerosis with multiple areas of stenosis, severe in the bilateral proximal MCA.  ECHOCARDIOGRAM 04/11/2018 Study Conclusions - Prior tests and procedures: 23 mm magna ease bioprosthetic aortic   valve 11/25/15. - Left ventricle: The cavity size was normal. Wall thickness was   normal. Systolic function was vigorous. The estimated ejection   fraction was in the range of 65% to 70%. Wall motion was normal;   there were no regional wall motion abnormalities. Doppler   parameters are consistent with abnormal left ventricular   relaxation (grade 1 diastolic dysfunction). - Aortic valve: A bioprosthesis was present and  functioning   normally. There was trivial regurgitation. - Mitral valve: There was trivial regurgitation. - Left atrium: The atrium was mildly dilated. - Right ventricle: Pacer wire or catheter noted in right ventricle.  EEG ADULT 04/12/18 Clinical Interpretation: This normal EEG is recorded in the waking and drowsy state. There was no seizure or seizure predisposition recorded on this study. Please note that a normal EEG does not preclude the possibility of epilepsy.   VAS US CAROTID DUPLEX BILATERAL 04/12/18 Final Interpretation: Right Carotid: Velocities in the right ICA are consistent with a 1-39% stenosis. Left Carotid: Velocities in the left ICA are consistent with a 1-39% stenosis. Vertebrals: Bilateral vertebral arteries demonstrate antegrade flow.     ASSESSMENT: [name] is a 57 y.o. year old male here with TIA on 04/10/18. Vascular risk factors include HLD, HTN and DM.  Patient is being seen today for hospital follow-up and overall has been doing well without residual deficits.   PLAN: -Continue aspirin 81 mg daily and clopidogrel 75 mg daily  and Lipitor 80 mg for secondary stroke prevention -Continue DAPT until 07/11/2018 and advised to stop aspirin 81 mg at that time and continue Plavix only -DAPT for 3 months due to bilateral MCA stenosis that possibly could have contributed to his stroke -F/u with PCP regarding your HLD, HTN and DM management -Check lipid panel and A1c today -last check 01/2018 -if lipid panel remains elevated, consider starting Zetia 10 mg daily for tighter control for secondary stroke prevention -Referral placed to [facility] sleep clinic for OSA work-up -continue to monitor BP at home -advised to continue to stay active and maintain a healthy diet -Maintain strict control of hypertension with blood pressure goal below 130/90, diabetes with hemoglobin A1c goal below 6.5% and cholesterol with LDL cholesterol (bad cholesterol) goal below 70 mg/dL. I also advised  the patient to eat a healthy diet with plenty of whole grains, cereals, fruits  and vegetables, exercise regularly and maintain ideal body weight.  Follow up in 3 months or call earlier if needed   Greater than 50% of time during this 25 minute visit was spent on counseling,explanation of diagnosis of TIA, reviewing risk factor management of HLD, HTN and DM, planning of further management, discussion with patient and family and coordination of care    George Hugh, AGNP-BC  [facility] [address] [place], [place] [address]  Phone [phone] Fax [phone] Note: This document was prepared with digital dictation and possible smart phrase technology. Any transcriptional errors that result from this process are unintentional.

## 2018-05-11 ENCOUNTER — Other Ambulatory Visit: Payer: Self-pay | Admitting: *Deleted

## 2018-05-11 ENCOUNTER — Ambulatory Visit (INDEPENDENT_AMBULATORY_CARE_PROVIDER_SITE_OTHER): Payer: PPO | Admitting: Adult Health

## 2018-05-11 ENCOUNTER — Encounter: Payer: Self-pay | Admitting: Adult Health

## 2018-05-11 VITALS — BP 149/94 | HR 71 | Ht 63.0 in | Wt 233.0 lb

## 2018-05-11 DIAGNOSIS — E785 Hyperlipidemia, unspecified: Secondary | ICD-10-CM | POA: Diagnosis not present

## 2018-05-11 DIAGNOSIS — I10 Essential (primary) hypertension: Secondary | ICD-10-CM

## 2018-05-11 DIAGNOSIS — E119 Type 2 diabetes mellitus without complications: Secondary | ICD-10-CM | POA: Diagnosis not present

## 2018-05-11 DIAGNOSIS — G459 Transient cerebral ischemic attack, unspecified: Secondary | ICD-10-CM | POA: Diagnosis not present

## 2018-05-11 MED ORDER — CLOPIDOGREL BISULFATE 75 MG PO TABS: 75.0000 mg | ORAL_TABLET | Freq: Every day | ORAL | 4 refills | Status: DC

## 2018-05-11 MED ORDER — CLOPIDOGREL BISULFATE 75 MG PO TABS: 75.0000 mg | ORAL_TABLET | Freq: Every day | ORAL | 1 refills | Status: DC

## 2018-05-11 NOTE — Patient Instructions (Addendum)
Continue aspirin 81 mg daily and clopidogrel 75 mg daily  and lipitor 80g  for secondary stroke prevention  You will stop aspirin 81mg  after 07/11/18 and continue on plavix only  We will check cholesterol levels today and consider possibly starting zeita if needed for better management along with checking A1c  Referral placed for sleep apnea study  Continue to follow up with PCP regarding cholesterol, diabetes and blood pressure management   Continue to stay active and maintain a healthy diet  Continue to monitor blood pressure at home  Maintain strict control of hypertension with blood pressure goal below 130/90, diabetes with hemoglobin A1c goal below 6.5% and cholesterol with LDL cholesterol (bad cholesterol) goal below 70 mg/dL. I also advised the patient to eat a healthy diet with plenty of whole grains, cereals, fruits and vegetables, exercise regularly and maintain ideal body weight.  Followup in the future with me in 3 months or call earlier if needed       Thank you for coming to see [place] at [facility]. I hope we have been able to provide you high quality care today.  You may receive a patient satisfaction survey over the next few weeks. We would appreciate your feedback and comments so that we may continue to improve ourselves and the health of our patients.

## 2018-05-11 NOTE — Patient Outreach (Signed)
[facility]) Care Management  05/11/2018  Warfield Ficke [birth year] [phone]   RN Health Coach telephone call to patient.  [employer] compliance verified. Per patient he is in the car and didn't have his blood pressure readings. Patient stated that he would call RN Health coach back when he gets home. RN #2 outreach attempt  Plan: RN will attempt another outreach call within 10 business days

## 2018-05-12 ENCOUNTER — Other Ambulatory Visit: Payer: Self-pay | Admitting: Adult Health

## 2018-05-12 ENCOUNTER — Telehealth: Payer: Self-pay | Admitting: *Deleted

## 2018-05-12 LAB — HEMOGLOBIN A1C
Est. average glucose Bld gHb Est-mCnc: 131 mg/dL
Hgb A1c MFr Bld: 6.2 % — ABNORMAL HIGH (ref 4.8–5.6)

## 2018-05-12 LAB — LIPID PANEL
CHOLESTEROL TOTAL: 170 mg/dL (ref 100–199)
Chol/HDL Ratio: 5.9 ratio — ABNORMAL HIGH (ref 0.0–5.0)
HDL: 29 mg/dL — ABNORMAL LOW (ref >39)
Triglycerides: 542 mg/dL — ABNORMAL HIGH (ref 0–149)

## 2018-05-12 MED ORDER — EZETIMIBE 10 MG PO TABS: 10.0000 mg | ORAL_TABLET | Freq: Every day | ORAL | 4 refills | Status: DC

## 2018-05-12 NOTE — Progress Notes (Signed)
Due to recent lipid panel, triglycerides at 542, LDL unable to calculate due to high triglyceride level. Zetia 10mg  will be started in addition to Lipitor 80mg . Recommend repeat lipid panel in 6-8 weeks to ensure adequate medication intervention for HLD management. If levels do not decrease, consider starting Repatha for secondary stroke prevention. A1c also elevated at 6.2. Advise patient to decrease carb intake including breads, pastas, sweets and candy along with sugary drinks to try to decrease this level.

## 2018-05-12 NOTE — Progress Notes (Signed)
I agree with the above plan 

## 2018-05-12 NOTE — Telephone Encounter (Signed)
Due to recent lipid panel, triglycerides at 542, LDL unable to calculate due to high triglyceride level. Zetia 10mg  will be started in addition to Lipitor 80mg . Recommend repeat lipid panel in 6-8 weeks to ensure adequate medication intervention for HLD management. If levels do not decrease, consider starting Repatha for secondary stroke prevention. A1c also elevated at 6.2. Advise patient to decrease carb intake including breads, pastas, sweets and candy along with sugary drinks to try to decrease this level. I instructed pt of these results and the recommendation of starting zetia.  Called in to pharmacy and to start taking along with lipitor.  He verbalized understanding of instructions.  Placed on his calendar to et lab repeat in 6-8- wks.

## 2018-05-16 ENCOUNTER — Telehealth: Payer: Self-pay | Admitting: Internal Medicine

## 2018-05-16 NOTE — Telephone Encounter (Signed)
Returned pt daughter call and I informed pt that I have not received any FMLA paperwork for her. Pt daughter states that her job faxed it 3 weeks ago. I informed pt daughter that I go through Dr. Laural Benes box and I have not seen anything with her name on it. Pt daughter states that she will have her husband or dad bring form up to office for Dr. Laural Benes. I informed pt daughter that once we receive form  And Dr. Laural Benes fills it out I will fax it and give her a call to let her know. Pt states she understands and doesn't have any questions or concerns

## 2018-05-16 NOTE — Telephone Encounter (Signed)
Pt's Daughter Ozias Hermanns called to request an update on her fathers FMLA paperwork, she needs this paperwork completed and faxed to her Job. Please follow up with her as soon as possible with an update [phone]

## 2018-05-18 ENCOUNTER — Ambulatory Visit: Payer: Self-pay | Admitting: Internal Medicine

## 2018-05-18 NOTE — Telephone Encounter (Signed)
Returned [name] call she didn't answer lvm informing her that I was returning her and she can give me a call back at her earliest convenience

## 2018-05-18 NOTE — Telephone Encounter (Signed)
Please call patients daughter about fmla today.

## 2018-05-19 ENCOUNTER — Telehealth: Payer: Self-pay

## 2018-05-19 NOTE — Telephone Encounter (Signed)
Contacted pt daughter and informed her that I have faxed her FMLA paperwork.   Mrs. [name] is requesting that I mail her the originals. Will be putting originals in the mail today.

## 2018-05-22 NOTE — Progress Notes (Signed)
CARDIOLOGY OFFICE NOTE  Date:  05/23/2018    [name] Date of Birth: March 29, 1961 Medical Record #[id number]  PCP:  Marcine Matar, MD  Cardiologist:  Nahser (seen 11/18/2015)  Chief Complaint  Patient presents with  . Coronary Artery Disease  . Follow-up    Seen for Dr. Elease Hashimoto    History of Present Illness: [name] is a 57 y.o. male who presents today for a post hospital visit. Seen for Dr. Elease Hashimoto.   He has a history of gout, DM, HTN, CAD with prior 4V CABG and bicuspid AV s/p bioprosthetic AVR in 2017 and prior CVA.   He has not been seen in this office since April of 2017. He had been hospitalized in March of 2017 with CHF and severe AI - underwent his CABG and AVR. Complicated by AF and was on amiodarone which was stopped due to QT prolongation. Readmitted in 12/2015 with CVA - not felt to be embolic but from small vessel disease - placed on aspirin/Plavix for 3 months and then to just be on Plavix.   Admitted last month with TIA - had facial droop, speech difficulty, generalized weakness, stomach ache and nausea - no TPA due to resolution of symptoms.  This was following the death/burial of his wife. Source unclear. AF needed to be ruled out. Seen by Neurology who recommended putting back on DAPT for 3 months and then just Plavix. Statin restarted. Dose of Lasix was cut back. Referred for event monitor.   He has seen neurology earlier this month - noted on the day of the event he had doubled up on his BP medicines ? How much this contributed given the bilateral MCA stenoses. He was doing well. Sleep study arranged.   Comes in today. Here alone. He feels like he is doing "great". No real concerns other than his BP is too high. Running about like what we got here today. He does not know when to stop aspirin. Did not know who his heart doctor was. No chest pain. Breathing is good. He is pretty sure he "double dosed" his medicines on the day of this last event which may  have caused his symptoms. No palpitations. He has a few more days of his monitor - so far looks ok. He has no real concerns other than his BP.   Past Medical History:  Diagnosis Date  . Allergy   . Anemia   . Anxiety   . Baker's cyst of knee   . Blood transfusion without reported diagnosis    as baby   . Coronary artery disease    quadruple bypass - March 2016  . GERD (gastroesophageal reflux disease)   . Gouty arthritis    "real bad" (01/17/2013)  . Heart murmur   . Hypercholesteremia   . Hypertension   . Myocardial infarction (HCC) 2017  . Stroke (HCC)   . Type II diabetes mellitus (HCC)     Past Surgical History:  Procedure Laterality Date  . AORTIC VALVE REPLACEMENT N/A 11/25/2015   Procedure: AORTIC VALVE REPLACEMENT (AVR) USING A MAGNA EASE ;  Surgeon: Alleen Borne, MD;  Location: [facility] OR;  Service: Open Heart Surgery;  Laterality: N/A;  . CARDIAC CATHETERIZATION N/A 11/19/2015   Procedure: Right/Left Heart Cath and Coronary Angiography;  Surgeon: Lyn Records, MD;  Location: [facility] INVASIVE CV LAB;  Service: Cardiovascular;  Laterality: N/A;  . COLONOSCOPY  2004  . CORONARY ARTERY BYPASS GRAFT N/A 11/25/2015  Procedure: CORONARY ARTERY BYPASS GRAFTING (CABG)x4 LIMA-LAD; SVG-OM; SVG-RCA; SVG-DIAG;  Surgeon: Alleen Borne, MD;  Location: [facility] OR;  Service: Open Heart Surgery;  Laterality: N/A;  . CYST REMOVAL TRUNK N/A 08/24/2016   Procedure: EXCISION OF BACK ABSCESS;  Surgeon: Jimmye Norman, MD;  Location:  SURGERY CENTER;  Service: General;  Laterality: N/A;  . FINGER AMPUTATION Right 1980's   3rd & 4th digits "got them mashed" (01/17/2013)  . KNEE ARTHROSCOPY Right 1980's  . MULTIPLE EXTRACTIONS WITH ALVEOLOPLASTY Bilateral 11/21/2015   Procedure: Extraction of tooth #'s 2,12 with alveoloplasty and gross debridement of remaining dentition;  Surgeon: Charlynne Pander, DDS;  Location: [facility] OR;  Service: Oral Surgery;  Laterality: Bilateral;  . TEE WITHOUT CARDIOVERSION  N/A 11/20/2015   Procedure: TRANSESOPHAGEAL ECHOCARDIOGRAM (TEE);  Surgeon: Laurey Morale, MD;  Location: [facility] ENDOSCOPY;  Service: Cardiovascular;  Laterality: N/A;  . TEE WITHOUT CARDIOVERSION N/A 11/25/2015   Procedure: TRANSESOPHAGEAL ECHOCARDIOGRAM (TEE);  Surgeon: Alleen Borne, MD;  Location: [facility] OR;  Service: Open Heart Surgery;  Laterality: N/A;     Medications: Current Meds  Medication Sig  . allopurinol (ZYLOPRIM) 300 MG tablet Take 1 tablet (300 mg total) by mouth daily.  Marland Kitchen aspirin EC 81 MG tablet Take 1 tablet (81 mg total) by mouth daily. Please stop taking asa after three month.  Marland Kitchen atorvastatin (LIPITOR) 80 MG tablet TAKE ONE TABLET BY MOUTH ONCE DAILY AT  6  PM  . clopidogrel (PLAVIX) 75 MG tablet Take 1 tablet (75 mg total) by mouth daily.  Marland Kitchen ezetimibe (ZETIA) 10 MG tablet Take 1 tablet (10 mg total) by mouth daily.  . fluticasone (FLONASE) 50 MCG/ACT nasal spray Place 2 sprays into both nostrils daily.  . furosemide (LASIX) 40 MG tablet Take 1 tablet (40 mg total) by mouth every Monday, Wednesday, and Friday.  Marland Kitchen lisinopril (PRINIVIL,ZESTRIL) 5 MG tablet Take 1 tablet (5 mg total) by mouth daily.  Marland Kitchen loratadine (CLARITIN) 10 MG tablet Take 1 tablet (10 mg total) by mouth daily.  . metFORMIN (GLUCOPHAGE) 500 MG tablet Take 1 tablet (500 mg total) by mouth daily with breakfast.  . metoprolol tartrate (LOPRESSOR) 25 MG tablet TAKE 1 TABLET BY MOUTH TWICE DAILY  . potassium chloride SA (KLOR-CON M20) 20 MEQ tablet Take 1 tablet (20 mEq total) by mouth every Monday, Wednesday, and Friday.     Allergies: Allergies  Allergen Reactions  . Other Anaphylaxis    mushrooms  . Hydrocodone-Acetaminophen Itching    Social History: The patient  reports that he has never smoked. He has never used smokeless tobacco. He reports that he drinks alcohol. He reports that he has current or past drug history. Drug: Marijuana.   Family History: The patient's family history includes Diabetes  in his father; Hypertension in his brother, brother, brother, father, mother, and sister.   Review of Systems: Please see the history of present illness.   Otherwise, the review of systems is positive for none.   All other systems are reviewed and negative.   Physical Exam: VS:  BP (!) 148/110 (BP Location: Left Arm, Patient Position: Sitting, Cuff Size: Normal)   Pulse 78   Ht 5\' 6"  (1.676 m)   Wt 231 lb (104.8 kg)   SpO2 96% Comment: at rest  BMI 37.28 kg/m  .  BMI Body mass index is 37.28 kg/m.  Wt Readings from Last 3 Encounters:  05/23/18 231 lb (104.8 kg)  05/11/18 233 lb (105.7 kg)  04/13/18  227 lb 9.6 oz (103.2 kg)   Repeat BP by me is 150/110  General: Pleasant. Well developed, well nourished and in no acute distress.   HEENT: Normal.  Neck: Supple, no JVD, carotid bruits, or masses noted.  Cardiac: Regular rate and rhythm. Outflow murmur noted. No edema.  Respiratory:  Lungs are clear to auscultation bilaterally with normal work of breathing.  GI: Soft and nontender.  MS: No deformity or atrophy. Gait and ROM intact.  Skin: Warm and dry. Color is normal.  Neuro:  Strength and sensation are intact and no gross focal deficits noted.  Psych: Alert, appropriate and with normal affect.   LABORATORY DATA:  EKG:  EKG is not ordered today.  Lab Results  Component Value Date   WBC 8.7 04/10/2018   HGB 13.6 04/10/2018   HCT 40.0 04/10/2018   PLT 283 04/10/2018   GLUCOSE 133 (H) 04/12/2018   CHOL 170 05/11/2018   TRIG 542 (H) 05/11/2018   HDL 29 (L) 05/11/2018   LDLCALC Comment 05/11/2018   ALT 12 04/10/2018   AST 26 04/10/2018   NA 136 04/12/2018   K 4.4 04/12/2018   CL 108 04/12/2018   CREATININE 1.24 04/12/2018   BUN 27 (H) 04/12/2018   CO2 19 (L) 04/12/2018   TSH 3.027 11/15/2015   PSA 2.20 10/14/2009   INR 0.99 04/10/2018   HGBA1C 6.2 (H) 05/11/2018   MICROALBUR 26.3 06/24/2016     BNP (last 3 results) No results for input(s): BNP in the last 8760  hours.  ProBNP (last 3 results) No results for input(s): PROBNP in the last 8760 hours.   Other Studies Reviewed Today:  Carotid Doppler Final Interpretation 04/2018: Right Carotid: Velocities in the right ICA are consistent with a 1-39% stenosis.  Left Carotid: Velocities in the left ICA are consistent with a 1-39% stenosis.  Vertebrals: Bilateral vertebral arteries demonstrate antegrade flow.  *See table(s) above for measurements and observations. Electronically signed by Coral Else MD on 04/12/2018 at 6:10:07 PM.   Echo Study Conclusions 04/2018  - Prior tests and procedures: 23 mm magna ease bioprosthetic aortic   valve 11/25/15. - Left ventricle: The cavity size was normal. Wall thickness was   normal. Systolic function was vigorous. The estimated ejection   fraction was in the range of 65% to 70%. Wall motion was normal;   there were no regional wall motion abnormalities. Doppler   parameters are consistent with abnormal left ventricular   relaxation (grade 1 diastolic dysfunction). - Aortic valve: A bioprosthesis was present and functioning   normally. There was trivial regurgitation. - Mitral valve: There was trivial regurgitation. - Left atrium: The atrium was mildly dilated. - Right ventricle: Pacer wire or catheter noted in right ventricle.  Impressions:  - No cardiac source of emboli was indentified. Compared to the   prior study, there has been no significant interval change.    Assessment/Plan:  1. Left brain TIA vs presyncope - source unclear - has bilateral MCA stenosis. Stop aspirin after November 5th and stay on Plavix. Seems to be doing well. Preliminary on the event monitor is ok. Final pending. No AF noted.   2. Prior CVA - back on DAPT for now and will end up on Plavix long term.   3. CAD with prior CABG - no active chest pain.   4. AI - s/p AVR - recent echo reviewed.    5. HLD - now on high dose statin along with Zetia - will need  lab on  return  6. DM - per PCP  7. HTN - BP running high - would like to bring down slowly given bilateral MCA stenosis. Will increase his ACE to BID. Lab today.   Current medicines are reviewed with the patient today.  The patient does not have concerns regarding medicines other than what has been noted above.  The following changes have been made:  See above.  Labs/ tests ordered today include:   No orders of the defined types were placed in this encounter.    Disposition:   FU with Dr. Elease Hashimoto in 4 to 6 weeks with lab on return. I am happy to see back as needed.   Patient is agreeable to this plan and will call if any problems develop in the interim.   SignedNorma Fredrickson, NP  05/23/2018 3:15 PM  [facility] [address] [place], [place]  [address] Phone: [phone] Fax: [phone]

## 2018-05-23 ENCOUNTER — Encounter: Payer: Self-pay | Admitting: Nurse Practitioner

## 2018-05-23 ENCOUNTER — Ambulatory Visit (INDEPENDENT_AMBULATORY_CARE_PROVIDER_SITE_OTHER): Payer: PPO | Admitting: Nurse Practitioner

## 2018-05-23 DIAGNOSIS — E119 Type 2 diabetes mellitus without complications: Secondary | ICD-10-CM | POA: Diagnosis not present

## 2018-05-23 DIAGNOSIS — E785 Hyperlipidemia, unspecified: Secondary | ICD-10-CM

## 2018-05-23 DIAGNOSIS — I10 Essential (primary) hypertension: Secondary | ICD-10-CM

## 2018-05-23 LAB — ECHOCARDIOGRAM COMPLETE
Height: 66 in
WEIGHTICAEL: 3446.23 [oz_av]

## 2018-05-23 MED ORDER — LISINOPRIL 5 MG PO TABS: 5.0000 mg | ORAL_TABLET | Freq: Two times a day (BID) | ORAL | 6 refills | Status: DC

## 2018-05-23 MED ORDER — ATORVASTATIN CALCIUM 80 MG PO TABS: ORAL_TABLET | ORAL | 6 refills | Status: DC

## 2018-05-23 MED ORDER — POTASSIUM CHLORIDE CRYS ER 20 MEQ PO TBCR: 20.0000 meq | EXTENDED_RELEASE_TABLET | ORAL | 3 refills | Status: DC

## 2018-05-23 MED ORDER — FUROSEMIDE 40 MG PO TABS: 40.0000 mg | ORAL_TABLET | ORAL | 3 refills | Status: DC

## 2018-05-23 NOTE — Patient Instructions (Addendum)
We will be checking the following labs today - BMET and CBC   Medication Instructions:    Continue with your current medicines. BUT  I am increasing the Lisinopril to twice a day - I have sent this to your pharmacy  I have refilled the Lipitor, Lasix and potassium as well - they are at the pharmacy too  STOP aspirin on November the 5th - you will STAY ON PLAVIX     Testing/Procedures To Be Arranged:  N/A  Follow-Up:   See Dr. Elease Hashimoto in about a month with fasting labs    Other Special Instructions:   N/A    If you need a refill on your cardiac medications before your next appointment, please call your pharmacy.   Call the [facility] office at [phone] if you have any questions, problems or concerns.

## 2018-05-24 LAB — CBC
Hematocrit: 36.3 % — ABNORMAL LOW (ref 37.5–51.0)
Hemoglobin: 12.7 g/dL — ABNORMAL LOW (ref 13.0–17.7)
MCH: 30.4 pg (ref 26.6–33.0)
MCHC: 35 g/dL (ref 31.5–35.7)
MCV: 87 fL (ref 79–97)
Platelets: 308 10*3/uL (ref 150–450)
RBC: 4.18 x10E6/uL (ref 4.14–5.80)
RDW: 14.4 % (ref 12.3–15.4)
WBC: 7.1 10*3/uL (ref 3.4–10.8)

## 2018-05-24 LAB — BASIC METABOLIC PANEL
BUN/Creatinine Ratio: 20 (ref 9–20)
BUN: 29 mg/dL — ABNORMAL HIGH (ref 6–24)
CO2: 18 mmol/L — ABNORMAL LOW (ref 20–29)
Calcium: 9.4 mg/dL (ref 8.7–10.2)
Chloride: 109 mmol/L — ABNORMAL HIGH (ref 96–106)
Creatinine, Ser: 1.46 mg/dL — ABNORMAL HIGH (ref 0.76–1.27)
GFR calc Af Amer: 60 mL/min/{1.73_m2} (ref >59)
GFR calc non Af Amer: 52 mL/min/{1.73_m2} — ABNORMAL LOW (ref >59)
Glucose: 108 mg/dL — ABNORMAL HIGH (ref 65–99)
Potassium: 5 mmol/L (ref 3.5–5.2)
Sodium: 144 mmol/L (ref 134–144)

## 2018-06-08 ENCOUNTER — Other Ambulatory Visit: Payer: Self-pay | Admitting: *Deleted

## 2018-06-08 ENCOUNTER — Ambulatory Visit: Payer: Self-pay | Admitting: *Deleted

## 2018-06-08 NOTE — Patient Outreach (Signed)
[facility]) Care Management  06/08/2018   [name] 17-May-1961 [id number]  RN Health Coach telephone call to patient.  [employer] compliance verified. Per patient he is doing pretty good. Patient  is not monitoring his blood pressure at home. His daughter and son in law live with him since his wife died recently. Patient last blood pressure check at the Dr office was 148/110. Per patient his medication was increased. Patient stated that he went and joined the [facility]. Per patient he has been going 3-4 times a week. Patient stated that he also walks around the block.  Patient stated that he does not drive and that his daughter and son in law takes him to all his appointments. Per patient he has not problem with transportation. Per patient he is taking his medications as per ordered. Patient stated that his daughter is getting more frozen foods to cook and she rinses the canned vegetables and boils. Patient has agreed to follow up outreach calls.    Current Medications:  Current Outpatient Medications  Medication Sig Dispense Refill  . allopurinol (ZYLOPRIM) 300 MG tablet Take 1 tablet (300 mg total) by mouth daily. 90 tablet 3  . aspirin EC 81 MG tablet Take 1 tablet (81 mg total) by mouth daily. Please stop taking asa after three month. 90 tablet 0  . atorvastatin (LIPITOR) 80 MG tablet TAKE ONE TABLET BY MOUTH ONCE DAILY AT  6  PM 30 tablet 6  . clopidogrel (PLAVIX) 75 MG tablet Take 1 tablet (75 mg total) by mouth daily. 90 tablet 4  . ezetimibe (ZETIA) 10 MG tablet Take 1 tablet (10 mg total) by mouth daily. 30 tablet 4  . fluticasone (FLONASE) 50 MCG/ACT nasal spray Place 2 sprays into both nostrils daily. 16 g 6  . furosemide (LASIX) 40 MG tablet Take 1 tablet (40 mg total) by mouth every Monday, Wednesday, and Friday. 30 tablet 3  . lisinopril (PRINIVIL,ZESTRIL) 5 MG tablet Take 1 tablet (5 mg total) by mouth 2 (two) times daily. 60 tablet 6  . loratadine (CLARITIN) 10 MG  tablet Take 1 tablet (10 mg total) by mouth daily. 30 tablet 11  . metFORMIN (GLUCOPHAGE) 500 MG tablet Take 1 tablet (500 mg total) by mouth daily with breakfast. 30 tablet 3  . metoprolol tartrate (LOPRESSOR) 25 MG tablet TAKE 1 TABLET BY MOUTH TWICE DAILY 180 tablet 3  . potassium chloride SA (KLOR-CON M20) 20 MEQ tablet Take 1 tablet (20 mEq total) by mouth every Monday, Wednesday, and Friday. 30 tablet 3   No current facility-administered medications for this visit.     Functional Status:  In your present state of health, do you have any difficulty performing the following activities: 06/08/2018 04/10/2018  Hearing? N N  Vision? N N  Difficulty concentrating or making decisions? N N  Walking or climbing stairs? N N  Dressing or bathing? N N  Doing errands, shopping? N N  Preparing Food and eating ? N -  Using the Toilet? N -  In the past six months, have you accidently leaked urine? N -  Do you have problems with loss of bowel control? N -  Managing your Medications? N -  Managing your Finances? N -  Housekeeping or managing your Housekeeping? N -  Some recent data might be hidden    Fall/Depression Screening: Fall Risk  06/08/2018 01/27/2018 06/25/2016  Falls in the past year? No No No  Number falls in past yr: - - -  Injury with Fall? - - -  Risk for fall due to : - - -  Follow up - - -   PHQ 2/9 Scores 06/08/2018 01/27/2018 11/03/2016 06/25/2016 04/01/2016 02/26/2016 12/15/2015  PHQ - 2 Score 1 0 0 1 0 0 0  PHQ- 9 Score - - - 6 - - -   THN CM Care Plan Problem One     Most Recent Value  Care Plan Problem One  Knowledge Deficit in Self Management of Hypertension  Role Documenting the Problem One  Health Coach  Care Plan for Problem One  Active  THN Long Term Goal   Patient will monitor blood pressure and keep a record within the next 90 days  THN Long Term Goal Start Date  06/08/18  Interventions for Problem One Long Term Goal  RN discussed monitoring blood pressure. RN sent  a blood pressure monitor. RN explained how to keep a record with the monitor. RN will follow up with further discussion and teach back  THN CM Short Term Goal #1   Patient will be able to read and understand food labels and the amount of sodium in foods within the next 30 days  THN CM Short Term Goal #1 Start Date  06/08/18  Interventions for Short Term Goal #1  RN discussed reading food labels and limiting sodium intake to 2000-300 mg day. RN sent picture shee on how to read food labels. RN will follow up with further discussion and teach back  THN CM Short Term Goal #2   Patient will have a better understanding of foods high and low in sodium within the next 30 days  THN CM Short Term Goal #2 Start Date  06/08/18  Interventions for Short Term Goal #2  RN discussed  foods high and low in sodium. RN sent educational material on low sodium diet. RN sent picture sheet of foods high and low in salt. RN sent A matter of choices blood pressure control. RN will follow up with further discussion and teach back  THN CM Short Term Goal #3  Patient will verbalize receiving Advance directive packet within the next 30 days  THN CM Short Term Goal #3 Start Date  06/08/18  Interventions for Short Tern Goal #3  Patient dowes not have an advance directive. Patient had recent loss of wife. RN sent advance directive packet      Assessment:  Last blood pressure reading 148/110 Patient is not monitoring blood pressure Patient has joined a Magazine features editor and is currently active in going Patient will benefit from [employer] telephonic outreach for education and support for hypertension self management.  Plan:  RN discussed foods low in sodium RN discussed food preparation RN discussed patient last blood pressure reading and importance of monitoring blood pressure RN sent  Engineer, production RN sent Blood pressure Monitor RN sent educational material on low sodium diet RN sent picture sheet on reading food labels  and foods high and low in salt RN sent A matter of Choices blood pressure control booklet RN will follow up within the month of December RN sent assessment and barriers letter to physician

## 2018-06-18 ENCOUNTER — Other Ambulatory Visit: Payer: Self-pay | Admitting: Family Medicine

## 2018-06-18 DIAGNOSIS — E119 Type 2 diabetes mellitus without complications: Secondary | ICD-10-CM

## 2018-06-23 ENCOUNTER — Ambulatory Visit: Payer: Self-pay | Admitting: Internal Medicine

## 2018-07-03 ENCOUNTER — Ambulatory Visit (INDEPENDENT_AMBULATORY_CARE_PROVIDER_SITE_OTHER): Payer: PPO | Admitting: Cardiovascular Disease

## 2018-07-03 ENCOUNTER — Encounter: Payer: Self-pay | Admitting: Cardiovascular Disease

## 2018-07-03 VITALS — BP 118/78 | HR 77 | Ht 66.0 in | Wt 231.0 lb

## 2018-07-03 DIAGNOSIS — Z952 Presence of prosthetic heart valve: Secondary | ICD-10-CM

## 2018-07-03 DIAGNOSIS — I2510 Atherosclerotic heart disease of native coronary artery without angina pectoris: Secondary | ICD-10-CM | POA: Diagnosis not present

## 2018-07-03 NOTE — Patient Instructions (Signed)
Medication Instructions:  Your physician recommends that you continue on your current medications as directed. Please refer to the Current Medication list given to you today.  If you need a refill on your cardiac medications before your next appointment, please call your pharmacy.   Lab work: Your physician recommends that you return for lab work in: 6 months on the day of or a few days before your office visit  You will need to FAST for this appointment - nothing to eat or drink after midnight the night before except water.    Testing/Procedures: None Ordered   Follow-Up: At [facility], you and your health needs are our priority.  As part of our continuing mission to provide you with exceptional heart care, we have created designated Provider Care Teams.  These Care Teams include your primary Cardiologist (physician) and Advanced Practice Providers (APPs -  Physician Assistants and Nurse Practitioners) who all work together to provide you with the care you need, when you need it. You will need a follow up appointment in:  6 months.  Please call our office 2 months in advance to schedule this appointment.  You may see Dr. Elease Hashimoto or one of the following Advanced Practice Providers on your designated Care Team: Tereso Newcomer, PA-C Vin Brush, [place] . Berton Bon, NP . Norma Fredrickson, NP

## 2018-07-03 NOTE — Progress Notes (Signed)
Cardiology Office Note:    Date:  07/03/2018   ID:  [name], DOB 02/26/1961, MRN [id number]  PCP:  Marcine Matar, MD  Cardiologist:  Kristeen Miss, MD  Electrophysiologist:  None   Referring MD: Marcine Matar, MD    Problem list 1.  Coronary artery disease -status post coronary artery bypass grafting 2.  Aortic stenosis-status post valve replacement 3.  Hypertension 4.  Diabetes mellitus 5.    Previous CVA 6.  Paroxysmal atrial fibrillation  Chief Complaint  Patient presents with  . Coronary Artery Disease  . Aortic Stenosis       [name] is a 57 y.o. male with a hx of coronary artery disease and aortic valve replacement.  He apparently had a left brain TIA.  He has  on dual antiplatelet therapy.  He is currently on Plavix.Marland Kitchen  History of coronary artery bypass grafting and aortic valve replacement on November 25, 2015:  1. Median Sternotomy 2. Extracorporeal circulation 3.   Coronary artery bypass grafting x 4   Left internal mammary graft to the LAD  SVG to diagonal  SVG to Om  SVG to RCA 4.   Endoscopic vein harvest from the right leg 5.   Aortic valve replacement using a 23 mm Edwards Magna-Ease pericardial valve  His blood pressure was a little high when he saw Lawson Fiscal back in early September.  She doubled his lisinopril to 5 mg twice a day.  He is now feeling well.  Blood pressures back down to normal.  Try to work on a diet to lose weight. Is exercising     Past Medical History:  Diagnosis Date  . Allergy   . Anemia   . Anxiety   . Baker's cyst of knee   . Blood transfusion without reported diagnosis    as baby   . Coronary artery disease    quadruple bypass - March 2016  . GERD (gastroesophageal reflux disease)   . Gouty arthritis    "real bad" (01/17/2013)  . Heart murmur   . Hypercholesteremia   . Hypertension   . Myocardial infarction (HCC) 2017  . Stroke (HCC)   . Type II diabetes mellitus (HCC)     Past Surgical  History:  Procedure Laterality Date  . AORTIC VALVE REPLACEMENT N/A 11/25/2015   Procedure: AORTIC VALVE REPLACEMENT (AVR) USING A MAGNA EASE ;  Surgeon: Alleen Borne, MD;  Location: [facility] OR;  Service: Open Heart Surgery;  Laterality: N/A;  . CARDIAC CATHETERIZATION N/A 11/19/2015   Procedure: Right/Left Heart Cath and Coronary Angiography;  Surgeon: Lyn Records, MD;  Location: [facility] INVASIVE CV LAB;  Service: Cardiovascular;  Laterality: N/A;  . COLONOSCOPY  2004  . CORONARY ARTERY BYPASS GRAFT N/A 11/25/2015   Procedure: CORONARY ARTERY BYPASS GRAFTING (CABG)x4 LIMA-LAD; SVG-OM; SVG-RCA; SVG-DIAG;  Surgeon: Alleen Borne, MD;  Location: [facility] OR;  Service: Open Heart Surgery;  Laterality: N/A;  . CYST REMOVAL TRUNK N/A 08/24/2016   Procedure: EXCISION OF BACK ABSCESS;  Surgeon: Jimmye Norman, MD;  Location: [facility];  Service: General;  Laterality: N/A;  . FINGER AMPUTATION Right 1980's   3rd & 4th digits "got them mashed" (01/17/2013)  . KNEE ARTHROSCOPY Right 1980's  . MULTIPLE EXTRACTIONS WITH ALVEOLOPLASTY Bilateral 11/21/2015   Procedure: Extraction of tooth #'s 2,12 with alveoloplasty and gross debridement of remaining dentition;  Surgeon: Charlynne Pander, DDS;  Location: [facility] OR;  Service: Oral Surgery;  Laterality: Bilateral;  .  TEE WITHOUT CARDIOVERSION N/A 11/20/2015   Procedure: TRANSESOPHAGEAL ECHOCARDIOGRAM (TEE);  Surgeon: Laurey Morale, MD;  Location: [facility] ENDOSCOPY;  Service: Cardiovascular;  Laterality: N/A;  . TEE WITHOUT CARDIOVERSION N/A 11/25/2015   Procedure: TRANSESOPHAGEAL ECHOCARDIOGRAM (TEE);  Surgeon: Alleen Borne, MD;  Location: [facility] OR;  Service: Open Heart Surgery;  Laterality: N/A;    Current Medications: Current Meds  Medication Sig  . allopurinol (ZYLOPRIM) 300 MG tablet Take 1 tablet (300 mg total) by mouth daily.  Marland Kitchen atorvastatin (LIPITOR) 80 MG tablet TAKE ONE TABLET BY MOUTH ONCE DAILY AT  6  PM  . clopidogrel (PLAVIX) 75 MG tablet Take 1  tablet (75 mg total) by mouth daily.  Marland Kitchen ezetimibe (ZETIA) 10 MG tablet Take 1 tablet (10 mg total) by mouth daily.  . fluticasone (FLONASE) 50 MCG/ACT nasal spray Place 2 sprays into both nostrils daily.  . furosemide (LASIX) 40 MG tablet Take 1 tablet (40 mg total) by mouth every Monday, Wednesday, and Friday.  Marland Kitchen lisinopril (PRINIVIL,ZESTRIL) 5 MG tablet Take 1 tablet (5 mg total) by mouth 2 (two) times daily.  Marland Kitchen loratadine (CLARITIN) 10 MG tablet Take 1 tablet (10 mg total) by mouth daily.  . metFORMIN (GLUCOPHAGE) 500 MG tablet TAKE 1 TABLET BY MOUTH ONCE DAILY WITH BREAKFAST  . metoprolol tartrate (LOPRESSOR) 25 MG tablet TAKE 1 TABLET BY MOUTH TWICE DAILY  . potassium chloride SA (KLOR-CON M20) 20 MEQ tablet Take 1 tablet (20 mEq total) by mouth every Monday, Wednesday, and Friday.     Allergies:   Other and Hydrocodone-acetaminophen   Social History   Socioeconomic History  . Marital status: Married    Spouse name: Not on file  . Number of children: Not on file  . Years of education: Not on file  . Highest education level: Not on file  Occupational History  . Not on file  Social Needs  . Financial resource strain: Not on file  . Food insecurity:    Worry: Not on file    Inability: Not on file  . Transportation needs:    Medical: Not on file    Non-medical: Not on file  Tobacco Use  . Smoking status: Never Smoker  . Smokeless tobacco: Never Used  Substance and Sexual Activity  . Alcohol use: Yes    Comment: occasionally  . Drug use: Yes    Types: Marijuana    Comment: pt states occasionally  . Sexual activity: Yes  Lifestyle  . Physical activity:    Days per week: Not on file    Minutes per session: Not on file  . Stress: Not on file  Relationships  . Social connections:    Talks on phone: Not on file    Gets together: Not on file    Attends religious service: Not on file    Active member of club or organization: Not on file    Attends meetings of clubs or  organizations: Not on file    Relationship status: Not on file  Other Topics Concern  . Not on file  Social History Narrative  . Not on file     Family History: The patient's family history includes Diabetes in his father; Hypertension in his brother, brother, brother, father, mother, and sister. There is no history of Colon cancer, Esophageal cancer, Rectal cancer, Stomach cancer, or Colon polyps.  ROS:      EKGs/Labs/Other Studies Reviewed:    The following studies were reviewed today:    EKG:  Recent Labs: 04/10/2018: ALT 12 05/23/2018: BUN 29; Creatinine, Ser 1.46; Hemoglobin 12.7; Platelets 308; Potassium 5.0; Sodium 144  Recent Lipid Panel    Component Value Date/Time   CHOL 170 05/11/2018 1116   TRIG 542 (H) 05/11/2018 1116   HDL 29 (L) 05/11/2018 1116   CHOLHDL 5.9 (H) 05/11/2018 1116   CHOLHDL 5.4 12/11/2015 0609   VLDL 35 12/11/2015 0609   LDLCALC Comment 05/11/2018 1116    Physical Exam:    VS:  BP 118/78   Pulse 77   Ht 5\' 6"  (1.676 m)   Wt 231 lb (104.8 kg)   SpO2 98%   BMI 37.28 kg/m     Wt Readings from Last 3 Encounters:  07/03/18 231 lb (104.8 kg)  05/23/18 231 lb (104.8 kg)  05/11/18 233 lb (105.7 kg)     GEN: Middle-aged gentleman, moderately obese. HEENT: Normal NECK: No JVD; No carotid bruits LYMPHATICS: No lymphadenopathy CARDIAC: Sternotomy is well-healed. RESPIRATORY:  Clear to auscultation without rales, wheezing or rhonchi  ABDOMEN: Soft, non-tender, non-distended MUSCULOSKELETAL:  No edema; No deformity  SKIN: Warm and dry NEUROLOGIC:  Alert and oriented x 3 PSYCHIATRIC:  Normal affect   ASSESSMENT:    1. Coronary artery disease involving native coronary artery of native heart without angina pectoris   2. Aortic valve replaced    PLAN:    In order of problems listed above:  1. Coronary artery disease: Monday seems to be doing well.  Is not having any episodes of chest pain.  Continue current management.  2.   Hyperlipidemia: His triglyceride levels were very high at his last visit with Norma Fredrickson, NP.  He is working on a good diet, exercise, weight loss program. We will get labs again when he sees her again in 6 months.  3.  History of TIA: Continue Plavix.  Further management per his primary medical doctor   Medication Adjustments/Labs and Tests Ordered: Current medicines are reviewed at length with the patient today.  Concerns regarding medicines are outlined above.  Orders Placed This Encounter  Procedures  . Lipid Profile  . Basic Metabolic Panel (BMET)  . Hepatic function panel   No orders of the defined types were placed in this encounter.   Patient Instructions  Medication Instructions:  Your physician recommends that you continue on your current medications as directed. Please refer to the Current Medication list given to you today.  If you need a refill on your cardiac medications before your next appointment, please call your pharmacy.   Lab work: Your physician recommends that you return for lab work in: 6 months on the day of or a few days before your office visit  You will need to FAST for this appointment - nothing to eat or drink after midnight the night before except water.    Testing/Procedures: None Ordered   Follow-Up: At [facility], you and your health needs are our priority.  As part of our continuing mission to provide you with exceptional heart care, we have created designated Provider Care Teams.  These Care Teams include your primary Cardiologist (physician) and Advanced Practice Providers (APPs -  Physician Assistants and Nurse Practitioners) who all work together to provide you with the care you need, when you need it. You will need a follow up appointment in:  6 months.  Please call our office 2 months in advance to schedule this appointment.  You may see Dr. Elease Hashimoto or one of the following Advanced Practice Providers on  your designated Care  Team: Tereso Newcomer, PA-C [employer], [place] . Berton Bon, NP . Norma Fredrickson, NP       Signed, Kristeen Miss, MD  07/03/2018 6:52 PM    [facility]

## 2018-07-19 ENCOUNTER — Encounter: Payer: Self-pay | Admitting: Neurology

## 2018-07-19 ENCOUNTER — Ambulatory Visit (INDEPENDENT_AMBULATORY_CARE_PROVIDER_SITE_OTHER): Payer: PPO | Admitting: Neurology

## 2018-07-19 VITALS — BP 161/103 | HR 74 | Ht 66.0 in | Wt 236.5 lb

## 2018-07-19 DIAGNOSIS — Z8673 Personal history of transient ischemic attack (TIA), and cerebral infarction without residual deficits: Secondary | ICD-10-CM

## 2018-07-19 DIAGNOSIS — R0683 Snoring: Secondary | ICD-10-CM

## 2018-07-19 DIAGNOSIS — E669 Obesity, unspecified: Secondary | ICD-10-CM | POA: Diagnosis not present

## 2018-07-19 DIAGNOSIS — R351 Nocturia: Secondary | ICD-10-CM | POA: Diagnosis not present

## 2018-07-19 DIAGNOSIS — G459 Transient cerebral ischemic attack, unspecified: Secondary | ICD-10-CM

## 2018-07-19 DIAGNOSIS — Z951 Presence of aortocoronary bypass graft: Secondary | ICD-10-CM | POA: Diagnosis not present

## 2018-07-19 NOTE — Patient Instructions (Signed)

## 2018-07-19 NOTE — Progress Notes (Signed)
Subjective:    Patient ID: [name] is a 57 y.o. male.  HPI     Dwayne Foley, MD, PhD [facility] [address] [address] [place], [place] [address]  Dear [name],   I saw your patient, [name], upon your kind request in my sleep clinic today for initial consultation of his sleep disorder, in particular, concern for underlying obstructive sleep apnea. The patient is unaccompanied today. As you know, [name] is a 57 year old right-handed gentleman with an underlying complex medical history of coronary artery disease with status post quadruple bypass in 2016, reflux disease, gout, hypertension, hyperlipidemia, history of MI, history of stroke, TIA, type 2 diabetes, allergies, anxiety, anemia and obesity, who reports snoring. I reviewed your office note from 05/11/2018. He does not report much in the way of daytime somnolence and feels fairly reasonably rested in the mornings. Epworth sleepiness score is 0 out of 24, fatigue score is 9 out of 63. He does snore per family. His wife had sleep apnea, sadly, she passed away recently in 08-31-2019from breast cancer. He lives with his 72 year old daughter and 69 year old son. He had a recent follow-up with his cardiologist. He is a nonsmoker and does not utilize alcohol, he does smoke marijuana occasionally. He has nocturia about once per average night. He denies recurrent morning headaches. He is trying to lose weight. Bedtime is between 1 and 2 AM typically, rise time between 6:30 and 8 AM. He is familiar with CPAP therapy as his wife had a CPAP machine and he would be willing to get tested for sleep apnea and consider CPAP treatment.  His Past Medical History Is Significant For: Past Medical History:  Diagnosis Date  . Allergy   . Anemia   . Anxiety   . Baker's cyst of knee   . Blood transfusion without reported diagnosis    as baby   . Coronary artery disease    quadruple bypass - March 2016  .  GERD (gastroesophageal reflux disease)   . Gouty arthritis    "real bad" (01/17/2013)  . Heart murmur   . Hypercholesteremia   . Hypertension   . Myocardial infarction (HCC) 2017  . Stroke (HCC)   . Type II diabetes mellitus (HCC)     His Past Surgical History Is Significant For: Past Surgical History:  Procedure Laterality Date  . AORTIC VALVE REPLACEMENT N/A 11/25/2015   Procedure: AORTIC VALVE REPLACEMENT (AVR) USING A MAGNA EASE ;  Surgeon: Alleen Borne, MD;  Location: [facility] OR;  Service: Open Heart Surgery;  Laterality: N/A;  . CARDIAC CATHETERIZATION N/A 11/19/2015   Procedure: Right/Left Heart Cath and Coronary Angiography;  Surgeon: Lyn Records, MD;  Location: [facility] INVASIVE CV LAB;  Service: Cardiovascular;  Laterality: N/A;  . COLONOSCOPY  2004  . CORONARY ARTERY BYPASS GRAFT N/A 11/25/2015   Procedure: CORONARY ARTERY BYPASS GRAFTING (CABG)x4 LIMA-LAD; SVG-OM; SVG-RCA; SVG-DIAG;  Surgeon: Alleen Borne, MD;  Location: [facility] OR;  Service: Open Heart Surgery;  Laterality: N/A;  . CYST REMOVAL TRUNK N/A 08/24/2016   Procedure: EXCISION OF BACK ABSCESS;  Surgeon: Jimmye Norman, MD;  Location: [facility];  Service: General;  Laterality: N/A;  . FINGER AMPUTATION Right 1980's   3rd & 4th digits "got them mashed" (01/17/2013)  . KNEE ARTHROSCOPY Right 1980's  . MULTIPLE EXTRACTIONS WITH ALVEOLOPLASTY Bilateral 11/21/2015   Procedure: Extraction of tooth #'s 2,12 with alveoloplasty and gross debridement of remaining dentition;  Surgeon: Charlynne Pander, DDS;  Location: [facility] OR;  Service: Oral Surgery;  Laterality: Bilateral;  . TEE WITHOUT CARDIOVERSION N/A 11/20/2015   Procedure: TRANSESOPHAGEAL ECHOCARDIOGRAM (TEE);  Surgeon: Laurey Morale, MD;  Location: [facility] ENDOSCOPY;  Service: Cardiovascular;  Laterality: N/A;  . TEE WITHOUT CARDIOVERSION N/A 11/25/2015   Procedure: TRANSESOPHAGEAL ECHOCARDIOGRAM (TEE);  Surgeon: Alleen Borne, MD;  Location: [facility] OR;  Service: Open Heart  Surgery;  Laterality: N/A;    His Family History Is Significant For: Family History  Problem Relation Age of Onset  . Diabetes Father   . Hypertension Father   . Hypertension Mother   . Hypertension Sister   . Hypertension Brother   . Hypertension Brother   . Hypertension Brother   . Colon cancer Neg Hx   . Esophageal cancer Neg Hx   . Rectal cancer Neg Hx   . Stomach cancer Neg Hx   . Colon polyps Neg Hx     His Social History Is Significant For: Social History   Socioeconomic History  . Marital status: Married    Spouse name: Not on file  . Number of children: Not on file  . Years of education: Not on file  . Highest education level: Not on file  Occupational History  . Not on file  Social Needs  . Financial resource strain: Not on file  . Food insecurity:    Worry: Not on file    Inability: Not on file  . Transportation needs:    Medical: Not on file    Non-medical: Not on file  Tobacco Use  . Smoking status: Never Smoker  . Smokeless tobacco: Never Used  Substance and Sexual Activity  . Alcohol use: Yes    Comment: occasionally  . Drug use: Yes    Types: Marijuana    Comment: pt states occasionally  . Sexual activity: Yes  Lifestyle  . Physical activity:    Days per week: Not on file    Minutes per session: Not on file  . Stress: Not on file  Relationships  . Social connections:    Talks on phone: Not on file    Gets together: Not on file    Attends religious service: Not on file    Active member of club or organization: Not on file    Attends meetings of clubs or organizations: Not on file    Relationship status: Not on file  Other Topics Concern  . Not on file  Social History Narrative  . Not on file    His Allergies Are:  Allergies  Allergen Reactions  . Other Anaphylaxis    mushrooms  . Hydrocodone-Acetaminophen Itching  :   His Current Medications Are:  Outpatient Encounter Medications as of 07/19/2018  Medication Sig  .  allopurinol (ZYLOPRIM) 300 MG tablet Take 1 tablet (300 mg total) by mouth daily.  Marland Kitchen atorvastatin (LIPITOR) 80 MG tablet TAKE ONE TABLET BY MOUTH ONCE DAILY AT  6  PM  . clopidogrel (PLAVIX) 75 MG tablet Take 1 tablet (75 mg total) by mouth daily.  Marland Kitchen ezetimibe (ZETIA) 10 MG tablet Take 1 tablet (10 mg total) by mouth daily.  . fluticasone (FLONASE) 50 MCG/ACT nasal spray Place 2 sprays into both nostrils daily.  . furosemide (LASIX) 40 MG tablet Take 1 tablet (40 mg total) by mouth every Monday, Wednesday, and Friday.  Marland Kitchen lisinopril (PRINIVIL,ZESTRIL) 5 MG tablet Take 1 tablet (5 mg total) by mouth 2 (two) times daily.  Marland Kitchen  loratadine (CLARITIN) 10 MG tablet Take 1 tablet (10 mg total) by mouth daily.  . metFORMIN (GLUCOPHAGE) 500 MG tablet TAKE 1 TABLET BY MOUTH ONCE DAILY WITH BREAKFAST  . metoprolol tartrate (LOPRESSOR) 25 MG tablet TAKE 1 TABLET BY MOUTH TWICE DAILY  . potassium chloride SA (KLOR-CON M20) 20 MEQ tablet Take 1 tablet (20 mEq total) by mouth every Monday, Wednesday, and Friday.   No facility-administered encounter medications on file as of 07/19/2018.   :  Review of Systems:  Out of a complete 14 point review of systems, all are reviewed and negative with the exception of these symptoms as listed below:  Review of Systems  Neurological:       Patient mentioned that he has no issues with sleeping. Patient stated that he wakes up feeling well rested. He also mentioned that he is unsure of why this appointment was made.      Epworth Sleepiness Scale 0= would never doze 1= slight chance of dozing 2= moderate chance of dozing 3= high chance of dozing  Sitting and reading:0 Watching TV:0 Sitting inactive in a public place (ex. Theater or meeting):0 As a passenger in a car for an hour without a break:0 Lying down to rest in the afternoon:0 Sitting and talking to someone:0 Sitting quietly after lunch (no alcohol):0 In a car, while stopped in traffic:0 Total:0      Objective:  Neurological Exam  Physical Exam Physical Examination:   Vitals:   07/19/18 1057  BP: (!) 161/103  Pulse: 74   Repeat BP was 160/100.  General Examination: The patient is a very pleasant 57 y.o. male in no acute distress. He appears well-developed and well-nourished and well groomed.   HEENT: Normocephalic, atraumatic, pupils are equal, round and reactive to light and accommodation. Extraocular tracking is good without limitation to gaze excursion or nystagmus noted. Normal smooth pursuit is noted. Hearing is grossly intact. Face is symmetric with normal facial animation and normal facial sensation. Speech is clear with no dysarthria noted. There is no hypophonia. There is no lip, neck/head, jaw or voice tremor. Neck is supple with full range of passive and active motion. There are no carotid bruits on auscultation. Oropharynx exam reveals: mild mouth dryness, adequate dental hygiene and moderate airway crowding, due to redundant soft palate and larger tongue. He has thicker soft palate and uvula is not fully visualized, tonsils not fully visualized, Mallampati is class III. Neck circumference is 16-7/8 inches. He has a minimal overbite. Tongue protrudes centrally and palate elevates symmetrically.  Chest: Clear to auscultation without wheezing, rhonchi or crackles noted.  Heart: S1+S2+0, regular and normal with 3/6 systolic murmur.   Abdomen: Soft, non-tender and non-distended with normal bowel sounds appreciated on auscultation.  Extremities: There is no pitting edema in the distal lower extremities bilaterally. Pedal pulses are intact.  Skin: Warm and dry without trophic changes noted.  Musculoskeletal: exam reveals no obvious joint deformities, tenderness or joint swelling or erythema.   Neurologically:  Mental status: The patient is awake, alert and oriented in all 4 spheres. His immediate and remote memory, attention, language skills and fund of knowledge are  appropriate. There is no evidence of aphasia, agnosia, apraxia or anomia. Speech is clear with normal prosody and enunciation. Thought process is linear. Mood is normal and affect is normal.  Cranial nerves II - XII are as described above under HEENT exam. In addition: shoulder shrug is normal with equal shoulder height noted. Motor exam: Normal bulk, strength and  tone is noted. There is no drift, tremor or rebound. Romberg is negative. Reflexes are 1+. Fine motor skills and coordination: grossly intact.  Cerebellar testing: No dysmetria or intention tremor. There is no truncal or gait ataxia.  Sensory exam: intact to light touch.  Gait, station and balance: He stands easily. No veering to one side is noted. No leaning to one side is noted. Posture is age-appropriate and stance is narrow based. Gait shows normal stride length and normal pace. No problems turning are noted.  Assessment and Plan:  In summary, [name] is a very pleasant 57 y.o.-year old male with an underlying complex medical history of coronary artery disease with status post quadruple bypass in 2016, reflux disease, gout, hypertension, hyperlipidemia, history of MI, history of stroke, TIA, type 2 diabetes, allergies, anxiety, anemia and obesity, whose history and physical exam are concerning for obstructive sleep apnea (OSA). I had a long chat with the patient about my findings and the diagnosis of OSA, its prognosis and treatment options. We talked about medical treatments, surgical interventions and non-pharmacological approaches. I explained in particular the risks and ramifications of untreated moderate to severe OSA, especially with respect to developing cardiovascular disease down the Road, including congestive heart failure, difficult to treat hypertension, cardiac arrhythmias, or stroke. Even type 2 diabetes has, in part, been linked to untreated OSA. Symptoms of untreated OSA include daytime sleepiness, memory problems, mood  irritability and mood disorder such as depression and anxiety, lack of energy, as well as recurrent headaches, especially morning headaches. We talked about Marijuana cessation and trying to maintain a healthy lifestyle in general, as well as the importance of weight control. I encouraged the patient to eat healthy, exercise daily and keep well hydrated, to keep a scheduled bedtime and wake time routine, to not skip any meals and eat healthy snacks in between meals. I advised the patient not to drive when feeling sleepy. I recommended the following at this time: sleep study with potential positive airway pressure titration. (We will score hypopneas at 4%).   I explained the sleep test procedure to the patient and also outlined possible surgical and non-surgical treatment options of OSA, including the use of a custom-made dental device (which would require a referral to a specialist dentist or oral surgeon), upper airway surgical options, such as pillar implants, radiofrequency surgery, tongue base surgery, and UPPP (which would involve a referral to an ENT surgeon). Rarely, jaw surgery such as mandibular advancement may be considered.  I also explained the CPAP treatment option to the patient, who indicated that he would be willing to try CPAP if the need arises. I explained the importance of being compliant with PAP treatment, not only for insurance purposes but primarily to improve His symptoms, and for the patient's long term health benefit, including to reduce His cardiovascular risks. I answered all his questions today and the patient was in agreement. I plan to see him back after the sleep study is completed and encouraged him to call with any interim questions, concerns, problems or updates.   Thank you very much for allowing me to participate in the care of this nice patient. If I can be of any further assistance to you please do not hesitate to call me at [phone].  Sincerely,   Dwayne Foley,  MD, PhD

## 2018-08-08 ENCOUNTER — Other Ambulatory Visit: Payer: Self-pay | Admitting: *Deleted

## 2018-08-08 NOTE — Patient Outreach (Signed)
[facility]) Care Management  08/08/2018  [name] 1960-12-08 [id number]   Covering for assigned Health coach, Scarlette Calico Pleasant:  RNCM called to follow up. Client reports his blood pressure is good. He states his pressure yesterday was 124/74. He states he has been checking his blood pressure every other day. He states his is maintaining a low salt diet and staying away from fast foods.  Plan: Health Coach will continue to follow.  THN CM Care Plan Problem One     Most Recent Value  Care Plan Problem One  Knowledge Deficit in Self Management of Hypertension  (Pended)   Role Documenting the Problem One  Health Coach  (Pended)   Care Plan for Problem One  Active  (Pended)   THN Long Term Goal   Patient will monitor blood pressure and keep a record within the next 90 days  (Pended)   THN Long Term Goal Start Date  06/08/18  (Pended)   THN CM Short Term Goal #1   Patient will be able to read and understand food labels and the amount of sodium in foods within the next 30 days  (Pended)   THN CM Short Term Goal #1 Start Date  06/08/18  (Pended)   THN CM Short Term Goal #2   Patient will have a better understanding of foods high and low in sodium within the next 30 days  (Pended)   THN CM Short Term Goal #2 Start Date  06/08/18  (Pended)   THN CM Short Term Goal #3  Patient will verbalize receiving Advance directive packet within the next 30 days  (Pended)   THN CM Short Term Goal #3 Start Date  06/08/18  (Pended)      Covering for assigned Health coach, Scarlette Calico Pleasant: Kathyrn Sheriff, RN, MSN, [facility] Community Care Coordinator Cell: [phone]

## 2018-08-17 ENCOUNTER — Ambulatory Visit: Payer: PPO | Admitting: Adult Health

## 2018-09-11 ENCOUNTER — Ambulatory Visit (INDEPENDENT_AMBULATORY_CARE_PROVIDER_SITE_OTHER): Payer: PPO | Admitting: Neurology

## 2018-09-11 DIAGNOSIS — E669 Obesity, unspecified: Secondary | ICD-10-CM

## 2018-09-11 DIAGNOSIS — Z8673 Personal history of transient ischemic attack (TIA), and cerebral infarction without residual deficits: Secondary | ICD-10-CM

## 2018-09-11 DIAGNOSIS — G459 Transient cerebral ischemic attack, unspecified: Secondary | ICD-10-CM

## 2018-09-11 DIAGNOSIS — R351 Nocturia: Secondary | ICD-10-CM

## 2018-09-11 DIAGNOSIS — G4733 Obstructive sleep apnea (adult) (pediatric): Secondary | ICD-10-CM

## 2018-09-11 DIAGNOSIS — Z951 Presence of aortocoronary bypass graft: Secondary | ICD-10-CM

## 2018-09-11 DIAGNOSIS — R0683 Snoring: Secondary | ICD-10-CM

## 2018-09-14 ENCOUNTER — Telehealth: Payer: Self-pay

## 2018-09-14 NOTE — Telephone Encounter (Signed)
I called pt to discuss his sleep study results. No answer, left a message asking him to call me back. 

## 2018-09-14 NOTE — Addendum Note (Signed)
Addended by: [name] on: 09/14/2018 07:53 AM   Modules accepted: Orders

## 2018-09-14 NOTE — Telephone Encounter (Signed)
I called pt. I advised pt that Dr. Frances Furbish reviewed their sleep study results and found that pt has severe osa. Dr. Frances Furbish recommends that pt start an auto pap at home since pt's insurance will likely deny an in lab titration study. I reviewed PAP compliance expectations with the pt. Pt is agreeable to starting an auto-PAP. I advised pt that an order will be sent to a DME, [facility], and [facility] will call the pt within about one week after they file with the pt's insurance. [facility] will show the pt how to use the machine, fit for masks, and troubleshoot the auto-PAP if needed. A follow up appt was made for insurance purposes with Dr. Frances Furbish on 12/05/2018 at 10:30am. Pt verbalized understanding to arrive 15 minutes early and bring their auto-PAP. A letter with all of this information in it will be mailed to the pt as a reminder. I verified with the pt that the address we have on file is correct. Pt verbalized understanding of results. Pt had no questions at this time but was encouraged to call back if questions arise. I have sent the order to [facility] and have received confirmation that they have received the order.

## 2018-09-14 NOTE — Procedures (Signed)
[facility] Sleep @[facility] [address] [place], [place] [address] NAME:  [name]                                                                          DOB: 07-Mar-1961 MEDICAL RECORD no: [id number]                                                       DOS:  09/12/2018 REFERRING PHYSICIAN: George Hugh, NP STUDY PERFORMED: Home Sleep Test on [facility] HISTORY: 58 year old man with a history of CAD (s/p CABG), reflux disease, gout, hypertension, hyperlipidemia, history of MI, history of stroke, TIA, type 2 diabetes, allergies, anxiety, anemia and obesity, who reports snoring. Epworth sleepiness score is 0 out of 24, BMI of 37.9.   STUDY RESULTS:   Total Recording Time: 7 hrs, 16 mins; Total Sleep Time: 6 hrs, Total Apnea/Hypopnea Index (AHI): 63.2/h; RDI: 63.2 /h; REM AHI: 63.0/h Average Oxygen Saturation: 97%; Lowest Oxygen Desaturation: 88%  Total Time Oxygen Saturation Below or at 88%: 0.1 minutes  Average Heart Rate: 76 bpm IMPRESSION: OSA RECOMMENDATION: This home sleep test demonstrates severe obstructive sleep apnea (by number of events) with a total AHI of 63.2/hour and O2 nadir of 88%. Given the patient's medical history and sleep related complaints, treatment with positive airway pressure (in the form of CPAP) is recommended. This will require a full night CPAP titration study for proper treatment settings, O2 monitoring and mask fitting. Based on the severity of the sleep disordered breathing an attended titration study is indicated. However, patient's insurance has denied an attended sleep study; therefore, the patient will be advised to proceed with an autoPAP titration/trial at home for now. Please note that untreated obstructive sleep apnea may carry additional perioperative morbidity. Patients with significant obstructive sleep apnea should receive perioperative PAP therapy and the surgeons and particularly the anesthesiologist should be  informed of the diagnosis and the severity of the sleep disordered breathing. The patient should be cautioned not to drive, work at heights, or operate dangerous or heavy equipment when tired or sleepy. Review and reiteration of good sleep hygiene measures should be pursued with any patient. Other causes of the patient's symptoms, including circadian rhythm disturbances, an underlying mood disorder, medication effect and/or an underlying medical problem cannot be ruled out based on this test. Clinical correlation is recommended. The patient and his referring provider will be notified of the test results. The patient will be seen in follow up in sleep clinic at [facility]. I certify that I have reviewed the raw data recording prior to the issuance of this report in accordance with the standards of the American Academy of Sleep Medicine (AASM).  [name], MD, PhD [facility]) Diplomat, ABPN (Neurology and Sleep)

## 2018-09-14 NOTE — Progress Notes (Signed)
Patient referred by Sharmon Leyden, NP, seen by me on 07/19/18, HST on 09/12/18.    Please call and notify the patient that the recent home sleep test showed obstructive sleep apnea in the severe range. While I recommend treatment for this in the form CPAP, his insurance will not approve a sleep study for this. They will likely only approve a trial of autoPAP, which means, that we don't have to bring him in for a sleep study with CPAP, but will let him try an autoPAP machine at home, through a DME company (of his choice, or as per insurance requirement). The DME representative will educate him on how to use the machine, how to put the mask on, etc. I have placed an order in the chart. Please send referral, talk to patient, send report to referring MD. We will need a FU in sleep clinic for 10 weeks post-PAP set up, please arrange that with me or Jessica. Thanks,   [name], MD, PhD [facility])

## 2018-09-14 NOTE — Telephone Encounter (Signed)
-----   Message from [name], MD sent at 09/14/2018  7:53 AM EST ----- Patient referred by Sharmon Leyden, NP, seen by me on 07/19/18, HST on 09/12/18.    Please call and notify the patient that the recent home sleep test showed obstructive sleep apnea in the severe range. While I recommend treatment for this in the form CPAP, his insurance will not approve a sleep study for this. They will likely only approve a trial of autoPAP, which means, that we don't have to bring him in for a sleep study with CPAP, but will let him try an autoPAP machine at home, through a DME company (of his choice, or as per insurance requirement). The DME representative will educate him on how to use the machine, how to put the mask on, etc. I have placed an order in the chart. Please send referral, talk to patient, send report to referring MD. We will need a FU in sleep clinic for 10 weeks post-PAP set up, please arrange that with me or Jessica. Thanks,   [name], MD, PhD [facility])

## 2018-10-19 ENCOUNTER — Other Ambulatory Visit: Payer: Self-pay | Admitting: Adult Health

## 2018-10-20 NOTE — Telephone Encounter (Signed)
Thank you Dr. Laural Benes have a good day.

## 2018-10-27 ENCOUNTER — Other Ambulatory Visit: Payer: Self-pay | Admitting: Family Medicine

## 2018-10-27 DIAGNOSIS — I10 Essential (primary) hypertension: Secondary | ICD-10-CM

## 2018-10-31 NOTE — Telephone Encounter (Signed)
[facility] advised me that pt has not returned any of their phone calls. I called pt to discuss as well. No answer, left a message asking him to call me back.

## 2018-10-31 NOTE — Telephone Encounter (Signed)
Patient returned call to [facility]. I gave him tech number to call [facility] per her request. He understood & will call them right away

## 2018-11-07 DIAGNOSIS — G4733 Obstructive sleep apnea (adult) (pediatric): Secondary | ICD-10-CM | POA: Diagnosis not present

## 2018-12-04 ENCOUNTER — Telehealth: Payer: Self-pay

## 2018-12-04 NOTE — Telephone Encounter (Signed)
Due to current COVID 19 pandemic, our office is severely reducing in office visits for at least the next 2 weeks, in order to minimize the risk to our patients and healthcare providers.   I called pt. He started his cpap on 11/07/2018 and his appt needs to be delayed until at least 31 days of usage has occurred. Pt is agreeable to a new appt on 12/18/2018 at 11:00am. Pt reports that he does not have an email nor has any device to do a virtual visit. Pt is agreeable to a telephone visit.   Pt understands that although there may be some limitations with this type of visit, we will take all precautions to reduce any security or privacy concerns.  Pt understands that this will be treated like an in office visit and we will file with pt's insurance, and there may be a patient responsible charge related to this service.  Pt's meds, allergies, and PMH were updated. Pt understands that Dr. Frances Furbish will call him on 12/18/2018 at 11:30am to discuss his cpap. Pt verbalized understanding of new appt date and time.

## 2018-12-05 ENCOUNTER — Ambulatory Visit: Payer: Self-pay | Admitting: Neurology

## 2018-12-06 ENCOUNTER — Other Ambulatory Visit: Payer: Self-pay | Admitting: *Deleted

## 2018-12-06 NOTE — Patient Outreach (Signed)
[facility]) Care Management  12/06/2018   [name] 10-20-1960 [id number]  Subjective:   Objective:   Current Medications:  Current Outpatient Medications  Medication Sig Dispense Refill  . allopurinol (ZYLOPRIM) 300 MG tablet Take 1 tablet (300 mg total) by mouth daily. 90 tablet 3  . atorvastatin (LIPITOR) 80 MG tablet TAKE ONE TABLET BY MOUTH ONCE DAILY AT  6  PM 30 tablet 6  . clopidogrel (PLAVIX) 75 MG tablet Take 1 tablet (75 mg total) by mouth daily. 90 tablet 4  . ezetimibe (ZETIA) 10 MG tablet TAKE 1 TABLET BY MOUTH ONCE DAILY 30 tablet 6  . fluticasone (FLONASE) 50 MCG/ACT nasal spray Place 2 sprays into both nostrils daily. 16 g 6  . furosemide (LASIX) 40 MG tablet Take 1 tablet (40 mg total) by mouth every Monday, Wednesday, and Friday. 30 tablet 3  . lisinopril (PRINIVIL,ZESTRIL) 5 MG tablet Take 1 tablet (5 mg total) by mouth 2 (two) times daily. 60 tablet 6  . loratadine (CLARITIN) 10 MG tablet Take 1 tablet (10 mg total) by mouth daily. 30 tablet 11  . metFORMIN (GLUCOPHAGE) 500 MG tablet TAKE 1 TABLET BY MOUTH ONCE DAILY WITH BREAKFAST 30 tablet 2  . metoprolol tartrate (LOPRESSOR) 25 MG tablet Take 1 tablet (25 mg total) by mouth 2 (two) times daily. MUST MAKE APPT FOR FURTHER REFILLS 60 tablet 0  . potassium chloride SA (KLOR-CON M20) 20 MEQ tablet Take 1 tablet (20 mEq total) by mouth every Monday, Wednesday, and Friday. 30 tablet 3   No current facility-administered medications for this visit.     Functional Status:  In your present state of health, do you have any difficulty performing the following activities: 12/06/2018 06/08/2018  Hearing? N N  Vision? N N  Difficulty concentrating or making decisions? N N  Walking or climbing stairs? N N  Dressing or bathing? N N  Doing errands, shopping? N N  Preparing Food and eating ? N N  Using the Toilet? N N  In the past six months, have you accidently leaked urine? N N  Do you have problems  with loss of bowel control? N N  Managing your Medications? N N  Managing your Finances? N N  Housekeeping or managing your Housekeeping? N N  Some recent data might be hidden    Fall/Depression Screening: Fall Risk  12/06/2018 06/08/2018 01/27/2018  Falls in the past year? 0 No No  Number falls in past yr: - - -  Injury with Fall? - - -  Risk for fall due to : - - -  Follow up - - -   PHQ 2/9 Scores 12/06/2018 06/08/2018 01/27/2018 11/03/2016 06/25/2016 04/01/2016 02/26/2016  PHQ - 2 Score 0 1 0 0 1 0 0  PHQ- 9 Score - - - - 6 - -   THN CM Care Plan Problem One     Most Recent Value  Care Plan Problem One  Knowledge Deficit in Self Management of Hypertension  Role Documenting the Problem One  [place] for Problem One  Active  THN Long Term Goal   patient will follow up with Maintenance health care within the next 90 days  THN Long Term Goal Start Date  12/06/18  [facility] Long Term Goal Met Date  12/06/18  Interventions for Problem One Long Term Goal  RN discussed follow up with PCP, Eye exam and foot care. RN sent educational material on year to year and day  to day health maintenance. RN will follow up on compliance  THN CM Short Term Goal #1   Patient will be able to read and understand food labels and the amount of sodium in foods within the next 30 days  THN CM Short Term Goal #1 Met Date  12/06/18  [facility] CM Short Term Goal #2   Patient will have a better understanding of foods high and low in sodium within the next 30 days  THN CM Short Term Goal #3  Patient will verbalize receiving Advance directive packet within the next 30 days  THN CM Short Term Goal #3 Met Date  12/06/18  [facility] CM Short Term Goal #4  Patient will verbalize a better understanding of CPAP  within the next 30 days  THN CM Short Term Goal #4 Start Date  12/06/18  Interventions for Short Term Goal #4  RN discussed the patient using CPAP. RN Sent educational on CPAP use and care,. RN will follow up with further  discussion       Assessment:  Patient  is checking his blood pressure daily Patient  is checking his blood sugars Patient  is eating mostly frozen foods Patient has new CPAP  Plan:  Patient is wearing CPAP at night as ordered RN sent educational material on CPAP care Patient will continue to try to eat healthy Patient will follow up on health maintenance RN sent educational material on what is needed for health maintenance RN will follow up within the month of July  Shaneece Stockburger [place] Management [phone]

## 2018-12-07 ENCOUNTER — Other Ambulatory Visit: Payer: Self-pay

## 2018-12-07 ENCOUNTER — Ambulatory Visit: Payer: PPO | Attending: Internal Medicine | Admitting: Physician Assistant

## 2018-12-07 ENCOUNTER — Ambulatory Visit: Payer: Self-pay | Admitting: Internal Medicine

## 2018-12-07 DIAGNOSIS — I10 Essential (primary) hypertension: Secondary | ICD-10-CM | POA: Diagnosis not present

## 2018-12-07 DIAGNOSIS — M1000 Idiopathic gout, unspecified site: Secondary | ICD-10-CM | POA: Diagnosis not present

## 2018-12-07 DIAGNOSIS — E119 Type 2 diabetes mellitus without complications: Secondary | ICD-10-CM

## 2018-12-07 DIAGNOSIS — E785 Hyperlipidemia, unspecified: Secondary | ICD-10-CM

## 2018-12-07 DIAGNOSIS — J3089 Other allergic rhinitis: Secondary | ICD-10-CM | POA: Diagnosis not present

## 2018-12-07 MED ORDER — ATORVASTATIN CALCIUM 80 MG PO TABS: ORAL_TABLET | ORAL | 1 refills | Status: DC

## 2018-12-07 MED ORDER — FLUTICASONE PROPIONATE 50 MCG/ACT NA SUSP: 2.0000 | Freq: Every day | NASAL | 6 refills | Status: DC

## 2018-12-07 MED ORDER — CLOPIDOGREL BISULFATE 75 MG PO TABS: 75.0000 mg | ORAL_TABLET | Freq: Every day | ORAL | 1 refills | Status: DC

## 2018-12-07 MED ORDER — FUROSEMIDE 40 MG PO TABS: 40.0000 mg | ORAL_TABLET | ORAL | 3 refills | Status: DC

## 2018-12-07 MED ORDER — METFORMIN HCL 500 MG PO TABS: ORAL_TABLET | ORAL | 1 refills | Status: DC

## 2018-12-07 MED ORDER — ALLOPURINOL 300 MG PO TABS: 300.0000 mg | ORAL_TABLET | Freq: Every day | ORAL | 3 refills | Status: DC

## 2018-12-07 MED ORDER — LISINOPRIL 5 MG PO TABS: 5.0000 mg | ORAL_TABLET | Freq: Two times a day (BID) | ORAL | 6 refills | Status: DC

## 2018-12-07 MED ORDER — EZETIMIBE 10 MG PO TABS: 10.0000 mg | ORAL_TABLET | Freq: Every day | ORAL | 6 refills | Status: DC

## 2018-12-07 MED ORDER — POTASSIUM CHLORIDE CRYS ER 20 MEQ PO TBCR: 20.0000 meq | EXTENDED_RELEASE_TABLET | ORAL | 3 refills | Status: DC

## 2018-12-07 MED ORDER — METOPROLOL TARTRATE 25 MG PO TABS: 25.0000 mg | ORAL_TABLET | Freq: Two times a day (BID) | ORAL | 0 refills | Status: DC

## 2018-12-07 NOTE — Progress Notes (Signed)
Pt. Is requesting medication refills. 

## 2018-12-07 NOTE — Progress Notes (Signed)
Patient ID: [name], male   DOB: [birth year], 58 y.o.   MRN: [id number]  Virtual Visit via Telephone Note  I connected with [name] on 12/07/18 at  2:10 PM EDT by telephone and verified that I am speaking with the correct person using two identifiers.   I discussed the limitations, risks, security and privacy concerns of performing an evaluation and management service by telephone and the availability of in person appointments. I also discussed with the patient that there may be a patient responsible charge related to this service. The patient expressed understanding and agreed to proceed.   History of Present Illness:  Patient needs RF on all his meds.    He is compliant with all medications and says he doesn't eat sweets.  Reports BP and glucose readings at home as follows: BP 120s-138/70s-84 Glucose range:  108-170  He denies CP/SOB/ dizziness/HA. No s/sx hyper/hypoglycemia.  No complaints today.  Last A1c 05/2018=6.2  We reviewed names, doses, and sigs of all his meds over the phone.  He had his meds with him as we reviewed it all.    I am in my office at [facility].  Covid-19 precautions being taken.   Location of patient:  home Patient Consented to phone visit:  yes Participating persons: patient only Time on call:  11    Observations/Objective:  TP linear.  J/I seems appropriate.  Speech normal.  Mood is upbeat.   Assessment and Plan: 1. Essential hypertension Controlled-continue current regimen and continue to home monitor.  We have discussed target BP range and blood pressure goal. I have advised patient to check BP regularly and to call us back or report to clinic if the numbers are consistently higher than 140/90. We discussed the importance of compliance with medical therapy and DASH diet recommended, consequences of uncontrolled hypertension discussed.  - potassium chloride SA (KLOR-CON M20) 20 MEQ tablet; Take 1 tablet (20 mEq total) by mouth every Monday, Wednesday,  and Friday.  Dispense: 30 tablet; Refill: 3 - metoprolol tartrate (LOPRESSOR) 25 MG tablet; Take 1 tablet (25 mg total) by mouth 2 (two) times daily.  Dispense: 180 tablet; Refill: 0 - furosemide (LASIX) 40 MG tablet; Take 1 tablet (40 mg total) by mouth every Monday, Wednesday, and Friday.  Dispense: 30 tablet; Refill: 3  2. Type 2 diabetes mellitus without complication, without long-term current use of insulin (HCC) Not at goal.  Increase metformin to bid.  Continue to follow diabetic diet.  Continue to monitor glucose and bring to next visit - metFORMIN (GLUCOPHAGE) 500 MG tablet; 1 twice daily with food  Dispense: 180 tablet; Refill: 1 - lisinopril (PRINIVIL,ZESTRIL) 5 MG tablet; Take 1 tablet (5 mg total) by mouth 2 (two) times daily.  Dispense: 60 tablet; Refill: 6  3. Idiopathic gout, unspecified chronicity, unspecified site - allopurinol (ZYLOPRIM) 300 MG tablet; Take 1 tablet (300 mg total) by mouth daily.  Dispense: 90 tablet; Refill: 3  4. Dyslipidemia - atorvastatin (LIPITOR) 80 MG tablet; TAKE ONE TABLET BY MOUTH ONCE DAILY AT  6  PM  Dispense: 90 tablet; Refill: 1 - clopidogrel (PLAVIX) 75 MG tablet; Take 1 tablet (75 mg total) by mouth daily.  Dispense: 90 tablet; Refill: 1 - ezetimibe (ZETIA) 10 MG tablet; Take 1 tablet (10 mg total) by mouth daily.  Dispense: 30 tablet; Refill: 6  5. Seasonal allergic rhinitis due to other allergic trigger Controlled on flonase - fluticasone (FLONASE) 50 MCG/ACT nasal spray; Place 2 sprays into both nostrils daily.  Dispense: 16 g; Refill: 6   Follow Up Instructions: F/up with PCP in 2-3 months for OV and Fasting bloodwork.  Sooner if needed.     I discussed the assessment and treatment plan with the patient. The patient was provided an opportunity to ask questions and all were answered. The patient agreed with the plan and demonstrated an understanding of the instructions.   The patient was advised to call back or seek an in-person  evaluation if the symptoms worsen or if the condition fails to improve as anticipated.  I provided 11km minutes of non-face-to-face time during this encounter.   Georgian Co, PA-C

## 2018-12-08 DIAGNOSIS — D649 Anemia, unspecified: Secondary | ICD-10-CM | POA: Diagnosis not present

## 2018-12-08 DIAGNOSIS — G4733 Obstructive sleep apnea (adult) (pediatric): Secondary | ICD-10-CM | POA: Diagnosis not present

## 2018-12-12 ENCOUNTER — Encounter: Payer: Self-pay | Admitting: Neurology

## 2018-12-18 ENCOUNTER — Encounter: Payer: Self-pay | Admitting: Neurology

## 2018-12-18 ENCOUNTER — Ambulatory Visit (INDEPENDENT_AMBULATORY_CARE_PROVIDER_SITE_OTHER): Payer: PPO | Admitting: Neurology

## 2018-12-18 ENCOUNTER — Other Ambulatory Visit: Payer: Self-pay

## 2018-12-18 DIAGNOSIS — G4733 Obstructive sleep apnea (adult) (pediatric): Secondary | ICD-10-CM

## 2018-12-18 DIAGNOSIS — Z9989 Dependence on other enabling machines and devices: Secondary | ICD-10-CM | POA: Diagnosis not present

## 2018-12-18 NOTE — Progress Notes (Signed)
Interim history:   [name] is a 58 year old right-handed gentleman with an underlying complex medical history of coronary artery disease with status post quadruple bypass in 2016, reflux disease, gout, hypertension, hyperlipidemia, history of MI, history of stroke, TIA, type 2 diabetes, allergies, anxiety, anemia and obesity, with whom I am conducting a virtual, phone based FU visit in lieu of a face-to-face visit for FU of his obstructive sleep apnea. The patient is unaccompanied today and joins from home. I first met him on 07/19/2018 at the request of [name], NP, at which time he reported snoring.  In light of his medical history including cardiac history and stroke history use advised to proceed with sleep study testing.   Today, 12/14/2018: Please also see below for virtual visit documentation.   I reviewed his AutoPap compliance data from 11/13/2018 through 12/12/2018 which is a total of 30 days, during which time he used his machine 21 days with percent used days greater than 4 hours at 46.7%, indicating suboptimal compliance with an average usage of 4 hours of 45 minutes for days on treatment, residual AHI borderline at 5.3 per hour, 90th percentile pressure at 9.4 cm with a range of 6 cm to 13 cm, leak on the higher side.  The patient's allergies, current medications, family history, past medical history, past social history, past surgical history and problem list were reviewed and updated as appropriate.    Previously (copied from previous notes for reference):   07/19/2018: (He) reports snoring. I reviewed your office note from 05/11/2018. He does not report much in the way of daytime somnolence and feels fairly reasonably rested in the mornings. Epworth sleepiness score is 0 out of 24, fatigue score is 9 out of 63. He does snore per family. His wife had sleep apnea, sadly, she passed away recently in May 09, 2018 from breast cancer. He lives with his 60 year old  daughter and 36 year old son. He had a recent follow-up with his cardiologist. He is a nonsmoker and does not utilize alcohol, he does smoke marijuana occasionally. He has nocturia about once per average night. He denies recurrent morning headaches. He is trying to lose weight. Bedtime is between 1 and 2 AM typically, rise time between 6:30 and 8 AM. He is familiar with CPAP therapy as his wife had a CPAP machine and he would be willing to get tested for sleep apnea and consider CPAP treatment. His Past Medical History Is Significant For: Past Medical History:  Diagnosis Date   Allergy    Anemia    Anxiety    Baker's cyst of knee    Blood transfusion without reported diagnosis    as baby    Coronary artery disease    quadruple bypass - March 2016   GERD (gastroesophageal reflux disease)    Gouty arthritis    "real bad" (01/17/2013)   Heart murmur    Hypercholesteremia    Hypertension    Myocardial infarction ([place]) 2017   Stroke ([place])    Type II diabetes mellitus ([place])     His Past Surgical History Is Significant For: Past Surgical History:  Procedure Laterality Date   AORTIC VALVE REPLACEMENT N/A 11/25/2015   Procedure: AORTIC VALVE REPLACEMENT (AVR) USING A 23MM MAGNA EASE ;  Surgeon: Gaye Pollack, MD;  Location: [facility];  Service: Open Heart Surgery;  Laterality: N/A;   CARDIAC CATHETERIZATION N/A 11/19/2015   Procedure: Right/Left Heart Cath and Coronary Angiography;  Surgeon: Belva Crome, MD;  Location:  [facility];  Service: Cardiovascular;  Laterality: N/A;   COLONOSCOPY  2004   CORONARY ARTERY BYPASS GRAFT N/A 11/25/2015   Procedure: CORONARY ARTERY BYPASS GRAFTING (CABG)x4 LIMA-LAD; SVG-OM; SVG-RCA; SVG-DIAG;  Surgeon: Gaye Pollack, MD;  Location: [facility];  Service: Open Heart Surgery;  Laterality: N/A;   CYST REMOVAL TRUNK N/A 08/24/2016   Procedure: EXCISION OF BACK ABSCESS;  Surgeon: Judeth Horn, MD;  Location: [facility];  Service:  General;  Laterality: N/A;   FINGER AMPUTATION Right 1980's   3rd & 4th digits "got them mashed" (01/17/2013)   KNEE ARTHROSCOPY Right 1980's   MULTIPLE EXTRACTIONS WITH ALVEOLOPLASTY Bilateral 11/21/2015   Procedure: Extraction of tooth #'s 2,12 with alveoloplasty and gross debridement of remaining dentition;  Surgeon: Lenn Cal, DDS;  Location: [facility];  Service: Oral Surgery;  Laterality: Bilateral;   TEE WITHOUT CARDIOVERSION N/A 11/20/2015   Procedure: TRANSESOPHAGEAL ECHOCARDIOGRAM (TEE);  Surgeon: Larey Dresser, MD;  Location: [facility];  Service: Cardiovascular;  Laterality: N/A;   TEE WITHOUT CARDIOVERSION N/A 11/25/2015   Procedure: TRANSESOPHAGEAL ECHOCARDIOGRAM (TEE);  Surgeon: Gaye Pollack, MD;  Location: [facility];  Service: Open Heart Surgery;  Laterality: N/A;    His Family History Is Significant For: Family History  Problem Relation Age of Onset   Diabetes Father    Hypertension Father    Hypertension Mother    Hypertension Sister    Hypertension Brother    Hypertension Brother    Hypertension Brother    Colon cancer Neg Hx    Esophageal cancer Neg Hx    Rectal cancer Neg Hx    Stomach cancer Neg Hx    Colon polyps Neg Hx     His Social History Is Significant For: Social History   Socioeconomic History   Marital status: Married    Spouse name: Not on file   Number of children: Not on file   Years of education: Not on file   Highest education level: Not on file  Occupational History   Not on file  Social Needs   Financial resource strain: Not on file   Food insecurity:    Worry: Not on file    Inability: Not on file   Transportation needs:    Medical: Not on file    Non-medical: Not on file  Tobacco Use   Smoking status: Never Smoker   Smokeless tobacco: Never Used  Substance and Sexual Activity   Alcohol use: Yes    Comment: occasionally   Drug use: Yes    Types: Marijuana    Comment: pt states occasionally    Sexual activity: Yes  Lifestyle   Physical activity:    Days per week: Not on file    Minutes per session: Not on file   Stress: Not on file  Relationships   Social connections:    Talks on phone: Not on file    Gets together: Not on file    Attends religious service: Not on file    Active member of club or organization: Not on file    Attends meetings of clubs or organizations: Not on file    Relationship status: Not on file  Other Topics Concern   Not on file  Social History Narrative   Not on file    His Allergies Are:  Allergies  Allergen Reactions   Other Anaphylaxis    mushrooms   Hydrocodone-Acetaminophen Itching  :   His Current Medications Are:  Outpatient  Encounter Medications as of 12/18/2018  Medication Sig   allopurinol (ZYLOPRIM) 300 MG tablet Take 1 tablet (300 mg total) by mouth daily.   atorvastatin (LIPITOR) 80 MG tablet TAKE ONE TABLET BY MOUTH ONCE DAILY AT  6  PM   clopidogrel (PLAVIX) 75 MG tablet Take 1 tablet (75 mg total) by mouth daily.   ezetimibe (ZETIA) 10 MG tablet Take 1 tablet (10 mg total) by mouth daily.   fluticasone (FLONASE) 50 MCG/ACT nasal spray Place 2 sprays into both nostrils daily.   furosemide (LASIX) 40 MG tablet Take 1 tablet (40 mg total) by mouth every Monday, Wednesday, and Friday.   lisinopril (PRINIVIL,ZESTRIL) 5 MG tablet Take 1 tablet (5 mg total) by mouth 2 (two) times daily.   loratadine (CLARITIN) 10 MG tablet Take 1 tablet (10 mg total) by mouth daily.   metFORMIN (GLUCOPHAGE) 500 MG tablet 1 twice daily with food   metoprolol tartrate (LOPRESSOR) 25 MG tablet Take 1 tablet (25 mg total) by mouth 2 (two) times daily.   potassium chloride SA (KLOR-CON M20) 20 MEQ tablet Take 1 tablet (20 mEq total) by mouth every Monday, Wednesday, and Friday.   No facility-administered encounter medications on file as of 12/18/2018.   :  Review of Systems:  Out of a complete 14 point review of systems, all are  reviewed and negative with the exception of these symptoms as listed below:  Virtual Visit via Telephone Note on 12/18/18:   I connected with@ on 12/18/18 at 11:30 AM EDT by telephone and verified that I am speaking with the correct person using two identifiers.   I discussed the limitations, risks, security and privacy concerns of performing an evaluation and management service by telephone and the availability of in person appointments. I also discussed with the patient that there may be a patient responsible charge related to this service. The patient expressed understanding and agreed to proceed.   History of Present Illness: He reports doing okay with AutoPap, indicates no significant issues using it, does admit that when he is not at home he does not take the machine with him. He indicates that he would be willing to be more compliant and not skip any nights. He switched from a nasal mask to a full face mask which he tolerates better. He has not changed the filter yet, he is mindful of being cleanly with the machine and the mask.   Observations/Objective: There are no recent vital signs on file for review other than the ones from 07/19/2018.   He is in no acute distress, pleasant and conversant. No dysarthria or hypophonia or voice tremor noted. He is able to comprehend and answers all questions appropriately.  Assessment and Plan: In summary, [name] is a very pleasant 58 year old male with an underlying complex medical history of coronary artery disease with status post quadruple bypass in 2016, reflux disease, gout, hypertension, hyperlipidemia, history of MI, history of stroke, TIA, type 2 diabetes, allergies, anxiety, anemia and obesity, who presents for phone follow-up for his obstructive sleep apnea on sleep testing and starting AutoPap therapy. He is not yet fully compliant with treatment but indicates no problems using his machine. He skipped several nights because of being not  at home, he is willing to take his machine with him when he plans to stay overnight and when visiting. He is using a full facemask. He is reminded to be clinically with his supplies and his machine. He has not changed the filter  on the machine quite yet. He is willing to call his DME company for this. I suggested a follow-up with the nurse practitioner in 3 months. He is commended for his compliance thus far. We talked about his home sleep test result from 09/12/2018 and reviewed his compliance data for the past 30 days. I answered all his questions today and he was in agreement with the plan.  I had a long chat with the patient about my findings and the diagnosis of OSA, its prognosis and treatment options. We talked about medical treatments, surgical interventions and non-pharmacological approaches. I explained in particular the risks and ramifications of untreated moderate to severe OSA, especially with respect to developing cardiovascular disease down the Road, including congestive heart failure, difficult to treat hypertension, cardiac arrhythmias, or stroke. Even type 2 diabetes has, in part, been linked to untreated OSA. Symptoms of untreated OSA include daytime sleepiness, memory problems, mood irritability and mood disorder such as depression and anxiety, lack of energy, as well as recurrent headaches, especially morning headaches. We talked about Marijuana cessation and trying to maintain a healthy lifestyle in general, as well as the importance of weight control. I encouraged the patient to eat healthy, exercise daily and keep well hydrated, to keep a scheduled bedtime and wake time routine, to not skip any meals and eat healthy snacks in between meals. I advised the patient not to drive when feeling sleepy. I recommended the following at this time: sleep study with potential positive airway pressure titration. (We will score hypopneas at 4%).   I explained the sleep test procedure to the patient and  also outlined possible surgical and non-surgical treatment options of OSA, including the use of a custom-made dental device (which would require a referral to a specialist dentist or oral surgeon), upper airway surgical options, such as pillar implants, radiofrequency surgery, tongue base surgery, and UPPP (which would involve a referral to an ENT surgeon). Rarely, jaw surgery such as mandibular advancement may be considered.  I also explained the CPAP treatment option to the patient, who indicated that he would be willing to try CPAP if the need arises. I explained the importance of being compliant with PAP treatment, not only for insurance purposes but primarily to improve His symptoms, and for the patient's long term health benefit, including to reduce His cardiovascular risks. I answered all his questions today and the patient was in agreement. I plan to see him back after the sleep study is completed and encouraged him to call with any interim questions, concerns, problems or updates.    Follow Up Instructions:  1. Continue using autoPAP regularly with full compliance, patient is particularly advised not to skip any nights because of insurance requirement for compliance. 2. Follow-up in 3 mo, with NP next time.  3. Be mindful of PAP supplies and need for regularly changing mask, filter, etc. He is advised to call DME company for supplies. 4. Call or email through My Chart for any interim questions or concerns.   I discussed the assessment and treatment plan with the patient. The patient was provided an opportunity to ask questions and all were answered. The patient agreed with the plan and demonstrated an understanding of the instructions.   The patient was advised to call back or seek an in-person evaluation if the symptoms worsen or if the condition fails to improve as anticipated.   I provided 11 minutes of non-face-to-face time during this encounter.   Star Age, MD

## 2019-01-07 DIAGNOSIS — G4733 Obstructive sleep apnea (adult) (pediatric): Secondary | ICD-10-CM | POA: Diagnosis not present

## 2019-02-06 ENCOUNTER — Ambulatory Visit: Payer: PPO | Attending: Internal Medicine | Admitting: Internal Medicine

## 2019-02-06 ENCOUNTER — Telehealth: Payer: Self-pay

## 2019-02-06 ENCOUNTER — Other Ambulatory Visit: Payer: Self-pay

## 2019-02-06 DIAGNOSIS — R0981 Nasal congestion: Secondary | ICD-10-CM | POA: Diagnosis not present

## 2019-02-06 DIAGNOSIS — Z9989 Dependence on other enabling machines and devices: Secondary | ICD-10-CM

## 2019-02-06 DIAGNOSIS — I10 Essential (primary) hypertension: Secondary | ICD-10-CM

## 2019-02-06 DIAGNOSIS — G4733 Obstructive sleep apnea (adult) (pediatric): Secondary | ICD-10-CM

## 2019-02-06 DIAGNOSIS — E785 Hyperlipidemia, unspecified: Secondary | ICD-10-CM

## 2019-02-06 DIAGNOSIS — E1169 Type 2 diabetes mellitus with other specified complication: Secondary | ICD-10-CM

## 2019-02-06 DIAGNOSIS — I25810 Atherosclerosis of coronary artery bypass graft(s) without angina pectoris: Secondary | ICD-10-CM | POA: Diagnosis not present

## 2019-02-06 DIAGNOSIS — E1121 Type 2 diabetes mellitus with diabetic nephropathy: Secondary | ICD-10-CM | POA: Diagnosis not present

## 2019-02-06 DIAGNOSIS — E118 Type 2 diabetes mellitus with unspecified complications: Secondary | ICD-10-CM

## 2019-02-06 NOTE — Progress Notes (Signed)
Virtual Visit via Telephone Note Due to current restrictions/limitations of in-office visits due to the COVID-19 pandemic, this scheduled clinical appointment was converted to a telehealth visit  I connected with [name] on 02/06/19 at 9:11 a.m EDT by telephone and verified that I am speaking with the correct person using two identifiers. I am in my office.  The patient is at home.  Only the patient and myself participated in this encounter.  I discussed the limitations, risks, security and privacy concerns of performing an evaluation and management service by telephone and the availability of in person appointments. I also discussed with the patient that there may be a patient responsible charge related to this service. The patient expressed understanding and agreed to proceed.  History of Present Illness: DM, macro-albuminuria, HTN, HL, CAD s/p CABGand AVR2017, anemia, TIA, atherosclerosis of intracranial arteries, OSA.  Last saw me 04/2019.    OSA:  Dx with OSA since last visit.  On autoPAP.  Not using consistently due to sinus congestion on RT side.  Using Flonase and Claritin without much improvement "I stay stopped up all the time on the right side." Needs new mask and filter Q mth.  The medical supply company that he is using is called [employer].  He has called the company to request supplies several times.  He states that each time he has been told that it is in the mail but he has not received anything as yet.  He is frustrated with this.    Requesting a cream to use b/w his toes.  + itching b/w toes and skin "stays soft and moist."  He cleans his feet daily and tries to keep feet dry  DM:  Checks BS BID.  Range before BF 89-112, bedtime 96-98.  Taking Metformin only once a day Feels he is doing good with eating habits.  Eats a lot of veggies.  Daughter does the cooking Walks [place] through his neighborhood.  Use to go to [facility].  Has membership at [facility] gym but it is currently closed due  to the COVID pandemic. -no blurred vision.  Over due for eye exam -constant  numbness at tips of fingers on RT hand for 3-4 mths.  Pricks LT fingers when checking BS.  No hand weakness.   HYPERTENSION/CAD/HL Currently taking: see medication list Med Adherence: [x]  Yes    []  No Medication side effects: []  Yes    [x]  No Adherence with salt restriction: [x]  Yes    []  No Home Monitoring?: [x]  Yes    []  No Monitoring Frequency: twice a day Home BP results range: this morning 128/83, yesterday 124/86.  Other readings:  133/80, 132/84, 134/84 SOB? []  Yes    [x]  No Chest Pain?: []  Yes    [x]  No Leg swelling?: []  Yes    [x]  No Headaches?: []  Yes    [x]  No Dizziness? []  Yes    [x]  No Comments:  Has appt with cardiologist in 04/2019  Observations/Objective: No direct observation done as this was a telephone encounter.  Lab Results  Component Value Date   CHOL 170 05/11/2018   HDL 29 (L) 05/11/2018   LDLCALC Comment 05/11/2018   TRIG 542 (H) 05/11/2018   CHOLHDL 5.9 (H) 05/11/2018     Chemistry      Component Value Date/Time   NA 144 05/23/2018 1536   K 5.0 05/23/2018 1536   CL 109 (H) 05/23/2018 1536   CO2 18 (L) 05/23/2018 1536   BUN 29 (H) 05/23/2018  1536   CREATININE 1.46 (H) 05/23/2018 1536   CREATININE 1.05 02/24/2015 1601      Component Value Date/Time   CALCIUM 9.4 05/23/2018 1536   ALKPHOS 102 04/10/2018 1648   AST 26 04/10/2018 1648   ALT 12 04/10/2018 1648   BILITOT 0.4 04/10/2018 1648   BILITOT 0.4 01/27/2018 1637       Assessment and Plan: 1. OSA on CPAP We will have our case manager check with his medical supply company about the supplies that he needs for his CPAP machine. -Will refer for mask desensitization. We will also refer to ENT to see if they can help with the sinus congestion on the right side - Desensitization mask fit; Future  2. Controlled type 2 diabetes mellitus with complication, without long-term current use of insulin (HCC) Reported blood  sugars at goal.  Encourage him to continue healthy eating habits and regular exercise.  For now he will continue taking metformin 500 mg once a day - Hemoglobin A1c; Future - CBC; Future - Comprehensive metabolic panel; Future - Lipid panel; Future - Microalbumin / creatinine urine ratio; Future - Ambulatory referral to Ophthalmology  3. Macroalbuminuric diabetic nephropathy (HCC) Check urine microalbumin when he comes to the lab later this week.  Patient is on lisinopril which may need to be increased  4. Essential hypertension Diastolic blood pressure not at goal.  Would like to see what his creatinine looks like on blood test before increasing lisinopril.    5. Coronary artery disease involving coronary bypass graft of native heart without angina pectoris Clinically stable.  Continue metoprolol, lisinopril, atorvastatin, Zetia and Plavix.  He has an appointment coming up with the cardiologist per his report.  6. Hyperlipidemia associated with type 2 diabetes mellitus (HCC) Continue Lipitor and Zetia  7. Sinus congestion - Ambulatory referral to ENT  Follow Up Instructions: F/u in 2 mths   I discussed the assessment and treatment plan with the patient. The patient was provided an opportunity to ask questions and all were answered. The patient agreed with the plan and demonstrated an understanding of the instructions.   The patient was advised to call back or seek an in-person evaluation if the symptoms worsen or if the condition fails to improve as anticipated.  I provided 21 minutes of non-face-to-face time during this encounter.   Jonah Blue, MD

## 2019-02-06 NOTE — Telephone Encounter (Signed)
Per Dr Laural Benes, the patient has not received the CPAP supplies that he has requested from [employer].   Call placed to [employer] # [phone], spoke to [place]. She said she is not sure who told him that supplies were in the mail. He was set up with the CPAP in March 2020 and has not been complaint with using it since then. She explained that he needs to be compliant with use in order for them to obtain authorization for supplies and he has not been compliant - either not using it at all or not using it enough, so they are not sending him supplies.   Update provided to Dr Laural Benes

## 2019-02-06 NOTE — Progress Notes (Signed)
Pt states his fingertips on his right hand keeps getting numb.   Pt states his back and hip has been giving him issues lately. Pt states when he gets up he has to sit for min before getting up. Pt states this is has been going on for a month   Pt states blood sugar this morning is 98

## 2019-02-07 ENCOUNTER — Telehealth: Payer: Self-pay | Admitting: Internal Medicine

## 2019-02-07 DIAGNOSIS — G4733 Obstructive sleep apnea (adult) (pediatric): Secondary | ICD-10-CM | POA: Diagnosis not present

## 2019-02-07 NOTE — Telephone Encounter (Signed)
-----   Message from Robyne Peers, RN sent at 02/06/2019  3:38 PM EDT ----- I called [employer] # [phone], spoke to [name]. She said she is not sure who told him that supplies were in the mail.  He was set up with the CPAP in March 2020 and has not been complaint with using it since then.  She explained that he needs to be compliant with use in order for them to obtain authorization for supplies and he has not been compliant - either not using it at all or not using it enough, so they are not sending him supplies.  ----- Message ----- From: Marcine Matar, MD Sent: 02/06/2019   1:36 PM EDT To: Robyne Peers, RN  This patient is having difficulty getting supplies from his medical supply company for his CPAP machine.  He states he has called him several times and each time he was told that the supplies are in the mail but he has not received anything.  Can you please reach out to [employer] on his behalf?

## 2019-02-21 ENCOUNTER — Other Ambulatory Visit: Payer: Self-pay | Admitting: *Deleted

## 2019-02-21 NOTE — Patient Outreach (Signed)
[facility]) Care Management  02/21/2019  [name] 1961-03-23 [id number]   RN Health Coach is closing this program. Consumer is enrolled in [facility] external program.  [facility] Care Management [phone]

## 2019-02-23 ENCOUNTER — Encounter: Payer: Self-pay | Admitting: *Deleted

## 2019-02-23 NOTE — Telephone Encounter (Signed)
This encounter was created in error - please disregard.

## 2019-03-06 ENCOUNTER — Other Ambulatory Visit: Payer: Self-pay | Admitting: Internal Medicine

## 2019-03-06 DIAGNOSIS — J3089 Other allergic rhinitis: Secondary | ICD-10-CM

## 2019-03-08 ENCOUNTER — Ambulatory Visit: Payer: Self-pay | Admitting: *Deleted

## 2019-03-09 DIAGNOSIS — G4733 Obstructive sleep apnea (adult) (pediatric): Secondary | ICD-10-CM | POA: Diagnosis not present

## 2019-03-13 ENCOUNTER — Other Ambulatory Visit: Payer: Self-pay | Admitting: Internal Medicine

## 2019-03-13 DIAGNOSIS — J3089 Other allergic rhinitis: Secondary | ICD-10-CM

## 2019-03-22 ENCOUNTER — Ambulatory Visit: Payer: PPO | Admitting: Adult Health

## 2019-03-30 ENCOUNTER — Other Ambulatory Visit (HOSPITAL_COMMUNITY): Payer: PPO

## 2019-04-03 ENCOUNTER — Other Ambulatory Visit (HOSPITAL_BASED_OUTPATIENT_CLINIC_OR_DEPARTMENT_OTHER): Payer: PPO

## 2019-04-04 DIAGNOSIS — Z20828 Contact with and (suspected) exposure to other viral communicable diseases: Secondary | ICD-10-CM | POA: Diagnosis not present

## 2019-04-09 DIAGNOSIS — G4733 Obstructive sleep apnea (adult) (pediatric): Secondary | ICD-10-CM | POA: Diagnosis not present

## 2019-05-07 NOTE — Progress Notes (Signed)
Telehealth Visit     Virtual Visit via Video Note   This visit type was conducted due to national recommendations for restrictions regarding the COVID-19 Pandemic (e.g. social distancing) in an effort to limit this patient's exposure and mitigate transmission in our community.  Due to his co-morbid illnesses, this patient is at least at moderate risk for complications without adequate follow up.  This format is felt to be most appropriate for this patient at this time.  All issues noted in this document were discussed and addressed.  A limited physical exam was performed with this format.  Please refer to the patient's chart for his consent to telehealth for [facility]   Evaluation Performed:  Follow-up visit  This visit type was conducted due to national recommendations for restrictions regarding the COVID-19 Pandemic (e.g. social distancing).  This format is felt to be most appropriate for this patient at this time.  All issues noted in this document were discussed and addressed.  No physical exam was performed (except for noted visual exam findings with Video Visits).  Please refer to the patient's chart (MyChart message for video visits and phone note for telephone visits) for the patient's consent to telehealth for [facility].  Date:  05/08/2019   ID:  [name], DOB April 21, 1961, MRN [id number]  Patient Location:    Provider location:   Home  PCP:  Ladell Pier, MD  Cardiologist:  Servando Snare Mertie Moores, MD  Electrophysiologist:  None   Chief Complaint:  Follow up visit.   History of Present Illness:    [name] is a 58 y.o. male who presents via audio/video conferencing for a telehealth visit today.  Seen for Dr. Acie Fredrickson.   He has a history of gout, DM, HTN, CAD with prior 4V CABG and bicuspid AV s/p bioprosthetic AVR in 2017 and prior CVA. He has remained on DAPT.   Last seen in October of 2019 - I had seen him the month prior and BP was up - ACE was  increased. He was improved on follow up with Dr. Acie Fredrickson.   Unknown if the patient has symptoms concerning for COVID-19 infection (fever, chills, cough, or new shortness of breath).   Did not answer the phone despite multiple attempts.   Past Medical History:  Diagnosis Date  . Allergy   . Anemia   . Anxiety   . Baker's cyst of knee   . Blood transfusion without reported diagnosis    as baby   . Coronary artery disease    quadruple bypass - March 2016  . GERD (gastroesophageal reflux disease)   . Gouty arthritis    "real bad" (01/17/2013)  . Heart murmur   . Hypercholesteremia   . Hypertension   . Myocardial infarction ([place]) 2017  . Stroke ([place])   . Type II diabetes mellitus ([place])    Past Surgical History:  Procedure Laterality Date  . AORTIC VALVE REPLACEMENT N/A 11/25/2015   Procedure: AORTIC VALVE REPLACEMENT (AVR) USING A 23MM MAGNA EASE ;  Surgeon: Gaye Pollack, MD;  Location: [place] OR;  Service: Open Heart Surgery;  Laterality: N/A;  . CARDIAC CATHETERIZATION N/A 11/19/2015   Procedure: Right/Left Heart Cath and Coronary Angiography;  Surgeon: Belva Crome, MD;  Location: [place] CV LAB;  Service: Cardiovascular;  Laterality: N/A;  . COLONOSCOPY  2004  . CORONARY ARTERY BYPASS GRAFT N/A 11/25/2015   Procedure: CORONARY ARTERY BYPASS GRAFTING (CABG)x4 LIMA-LAD; SVG-OM; SVG-RCA; SVG-DIAG;  Surgeon: Gaye Pollack,  MD;  Location: [facility];  Service: Open Heart Surgery;  Laterality: N/A;  . CYST REMOVAL TRUNK N/A 08/24/2016   Procedure: EXCISION OF BACK ABSCESS;  Surgeon: Jimmye NormanJames Wyatt, MD;  Location: [facility];  Service: General;  Laterality: N/A;  . FINGER AMPUTATION Right 1980's   3rd & 4th digits "got them mashed" (01/17/2013)  . KNEE ARTHROSCOPY Right 1980's  . MULTIPLE EXTRACTIONS WITH ALVEOLOPLASTY Bilateral 11/21/2015   Procedure: Extraction of tooth #'s 2,12 with alveoloplasty and gross debridement of remaining dentition;  Surgeon: Charlynne Panderonald F Kulinski, DDS;   [facility];  Service: Oral Surgery;  Laterality: Bilateral;  . TEE WITHOUT CARDIOVERSION N/A 11/20/2015   Procedure: TRANSESOPHAGEAL ECHOCARDIOGRAM (TEE);  Surgeon: Laurey Moralealton S McLean, MD;  [facility];  Service: Cardiovascular;  Laterality: N/A;  . TEE WITHOUT CARDIOVERSION N/A 11/25/2015   Procedure: TRANSESOPHAGEAL ECHOCARDIOGRAM (TEE);  Surgeon: Alleen BorneBryan K Bartle, MD;  [facility];  Service: Open Heart Surgery;  Laterality: N/A;     No outpatient medications have been marked as taking for the 05/08/19 encounter (Telemedicine) with [name], Shriyan Arakawa C, NP.     Allergies:   Other and Hydrocodone-acetaminophen   Social History   Tobacco Use  . Smoking status: Never Smoker  . Smokeless tobacco: Never Used  Substance Use Topics  . Alcohol use: Yes    Comment: occasionally  . Drug use: Yes    Types: Marijuana    Comment: pt states occasionally     Family Hx: The patient's family history includes Diabetes in his father; Hypertension in his brother, brother, brother, father, mother, and sister. There is no history of Colon cancer, Esophageal cancer, Rectal cancer, Stomach cancer, or Colon polyps.  ROS:   Please see the history of present illness.   All other systems reviewed are negative.    Objective:    Vital Signs:  There were no vitals taken for this visit.   Wt Readings from Last 3 Encounters:  07/19/18 236 lb 8 oz (107.3 kg)  07/03/18 231 lb (104.8 kg)  05/23/18 231 lb (104.8 kg)    Alert male in no acute distress.   Labs/Other Tests and Data Reviewed:    Lab Results  Component Value Date   WBC 7.1 05/23/2018   HGB 12.7 (L) 05/23/2018   HCT 36.3 (L) 05/23/2018   PLT 308 05/23/2018   GLUCOSE 108 (H) 05/23/2018   CHOL 170 05/11/2018   TRIG 542 (H) 05/11/2018   HDL 29 (L) 05/11/2018   LDLCALC Comment 05/11/2018   ALT 12 04/10/2018   AST 26 04/10/2018   NA 144 05/23/2018   K 5.0 05/23/2018   CL 109 (H) 05/23/2018   CREATININE 1.46 (H) 05/23/2018    BUN 29 (H) 05/23/2018   CO2 18 (L) 05/23/2018   TSH 3.027 11/15/2015   PSA 2.20 10/14/2009   INR 0.99 04/10/2018   HGBA1C 6.2 (H) 05/11/2018   MICROALBUR 26.3 06/24/2016     BNP (last 3 results) No results for input(s): BNP in the last 8760 hours.  ProBNP (last 3 results) No results for input(s): PROBNP in the last 8760 hours.    Prior CV studies:    The following studies were reviewed today:  Carotid Doppler Final Interpretation 04/2018: Right Carotid: Velocities in the right ICA are consistent with a 1-39% stenosis.  Left Carotid: Velocities in the left ICA are consistent with a 1-39% stenosis.  Vertebrals: Bilateral vertebral arteries demonstrate antegrade flow.  *See table(s) above for measurements and observations.  Electronically signed by Coral Else MD on 04/12/2018 at 6:10:07 PM.   Echo Study Conclusions 04/2018  - Prior tests and procedures: 23 mm magna ease bioprosthetic aortic valve 11/25/15. - Left ventricle: The cavity size was normal. Wall thickness was normal. Systolic function was vigorous. The estimated ejection fraction was in the range of 65% to 70%. Wall motion was normal; there were no regional wall motion abnormalities. Doppler parameters are consistent with abnormal left ventricular relaxation (grade 1 diastolic dysfunction). - Aortic valve: A bioprosthesis was present and functioning normally. There was trivial regurgitation. - Mitral valve: There was trivial regurgitation. - Left atrium: The atrium was mildly dilated. - Right ventricle: Pacer wire or catheter noted in right ventricle.  Impressions:  - No cardiac source of emboli was indentified. Compared to the prior study, there has been no significant interval change.     ASSESSMENT & PLAN:    COVID-19 Education: The signs and symptoms of COVID-19 were discussed with the patient and how to seek care for testing (follow up with PCP or arrange E-visit).  The  importance of social distancing, staying at home, hand hygiene and wearing a mask when out in public were discussed today.  Patient Risk:   After full review of this patient's clinical status, I feel that they are at least moderate risk at this time.  Time:   Today, I have spent 0 minutes with the patient with telehealth technology discussing the above issues.     Medication Adjustments/Labs and Tests Ordered: Current medicines are reviewed at length with the patient today.  Concerns regarding medicines are outlined above.   Tests Ordered: No orders of the defined types were placed in this encounter.   Medication Changes: No orders of the defined types were placed in this encounter.   Disposition:  Message left to call and reschedule after multiple attempts to make contact.    Patient is agreeable to this plan and will call if any problems develop in the interim.   Avelina Laine, NP  05/08/2019 3:23 PM    [facility]

## 2019-05-08 ENCOUNTER — Other Ambulatory Visit: Payer: Self-pay

## 2019-05-08 ENCOUNTER — Telehealth: Payer: PPO | Admitting: Nurse Practitioner

## 2019-05-08 ENCOUNTER — Telehealth: Payer: Self-pay | Admitting: *Deleted

## 2019-05-08 ENCOUNTER — Encounter: Payer: Self-pay | Admitting: Nurse Practitioner

## 2019-05-08 DIAGNOSIS — E119 Type 2 diabetes mellitus without complications: Secondary | ICD-10-CM

## 2019-05-08 DIAGNOSIS — Z952 Presence of prosthetic heart valve: Secondary | ICD-10-CM

## 2019-05-08 DIAGNOSIS — I2510 Atherosclerotic heart disease of native coronary artery without angina pectoris: Secondary | ICD-10-CM

## 2019-05-08 DIAGNOSIS — I10 Essential (primary) hypertension: Secondary | ICD-10-CM

## 2019-05-08 DIAGNOSIS — E785 Hyperlipidemia, unspecified: Secondary | ICD-10-CM

## 2019-05-08 DIAGNOSIS — Z7189 Other specified counseling: Secondary | ICD-10-CM

## 2019-05-08 NOTE — Patient Instructions (Signed)
After Visit Summary:  We will be checking the following labs today -    Medication Instructions:    Continue with your current medicines.    If you need a refill on your cardiac medications before your next appointment, please call your pharmacy.     Testing/Procedures To Be Arranged:  N/A  Follow-Up:   See     At [facility], you and your health needs are our priority.  As part of our continuing mission to provide you with exceptional heart care, we have created designated Provider Care Teams.  These Care Teams include your primary Cardiologist (physician) and Advanced Practice Providers (APPs -  Physician Assistants and Nurse Practitioners) who all work together to provide you with the care you need, when you need it.  Special Instructions:  . Stay safe, stay home, wash your hands for at least 20 seconds and wear a mask when out in public.  . It was good to talk with you today.    Call the [facility] office at [phone] if you have any questions, problems or concerns.       

## 2019-05-08 NOTE — Telephone Encounter (Signed)
lvm pt needs to r/s telehealthvisit.  Pt was given the no show policy noted in pt's chart.

## 2019-05-10 DIAGNOSIS — G4733 Obstructive sleep apnea (adult) (pediatric): Secondary | ICD-10-CM | POA: Diagnosis not present

## 2019-06-09 DIAGNOSIS — G4733 Obstructive sleep apnea (adult) (pediatric): Secondary | ICD-10-CM | POA: Diagnosis not present

## 2019-07-10 DIAGNOSIS — G4733 Obstructive sleep apnea (adult) (pediatric): Secondary | ICD-10-CM | POA: Diagnosis not present

## 2019-08-09 DIAGNOSIS — G4733 Obstructive sleep apnea (adult) (pediatric): Secondary | ICD-10-CM | POA: Diagnosis not present

## 2019-08-13 ENCOUNTER — Other Ambulatory Visit: Payer: Self-pay | Admitting: Physician Assistant

## 2019-08-13 DIAGNOSIS — E785 Hyperlipidemia, unspecified: Secondary | ICD-10-CM

## 2019-08-15 ENCOUNTER — Other Ambulatory Visit: Payer: Self-pay | Admitting: Adult Health

## 2019-08-15 DIAGNOSIS — E785 Hyperlipidemia, unspecified: Secondary | ICD-10-CM

## 2019-08-16 ENCOUNTER — Ambulatory Visit: Payer: PPO

## 2019-08-20 ENCOUNTER — Other Ambulatory Visit: Payer: Self-pay | Admitting: Physician Assistant

## 2019-08-20 DIAGNOSIS — E785 Hyperlipidemia, unspecified: Secondary | ICD-10-CM

## 2019-08-29 ENCOUNTER — Other Ambulatory Visit: Payer: Self-pay

## 2019-08-29 ENCOUNTER — Ambulatory Visit: Payer: PPO | Attending: Internal Medicine | Admitting: Physician Assistant

## 2019-08-29 VITALS — BP 189/108 | HR 78 | Temp 98.4°F | Ht 66.0 in | Wt 224.0 lb

## 2019-08-29 DIAGNOSIS — I10 Essential (primary) hypertension: Secondary | ICD-10-CM | POA: Diagnosis not present

## 2019-08-29 DIAGNOSIS — J3089 Other allergic rhinitis: Secondary | ICD-10-CM

## 2019-08-29 DIAGNOSIS — M1000 Idiopathic gout, unspecified site: Secondary | ICD-10-CM

## 2019-08-29 DIAGNOSIS — E785 Hyperlipidemia, unspecified: Secondary | ICD-10-CM

## 2019-08-29 DIAGNOSIS — E119 Type 2 diabetes mellitus without complications: Secondary | ICD-10-CM

## 2019-08-29 DIAGNOSIS — E1121 Type 2 diabetes mellitus with diabetic nephropathy: Secondary | ICD-10-CM | POA: Diagnosis not present

## 2019-08-29 LAB — GLUCOSE, POCT (MANUAL RESULT ENTRY): POC Glucose: 130 mg/dl — AB (ref 70–99)

## 2019-08-29 LAB — POCT GLYCOSYLATED HEMOGLOBIN (HGB A1C): Hemoglobin A1C: 5.8 % — AB (ref 4.0–5.6)

## 2019-08-29 MED ORDER — METFORMIN HCL 500 MG PO TABS: 500.0000 mg | ORAL_TABLET | Freq: Every day | ORAL | 1 refills | Status: DC

## 2019-08-29 MED ORDER — FUROSEMIDE 40 MG PO TABS: 40.0000 mg | ORAL_TABLET | ORAL | 3 refills | Status: DC

## 2019-08-29 MED ORDER — CLOPIDOGREL BISULFATE 75 MG PO TABS: 75.0000 mg | ORAL_TABLET | Freq: Every day | ORAL | 1 refills | Status: DC

## 2019-08-29 MED ORDER — ALLOPURINOL 300 MG PO TABS: 300.0000 mg | ORAL_TABLET | Freq: Every day | ORAL | 3 refills | Status: DC

## 2019-08-29 MED ORDER — LISINOPRIL 5 MG PO TABS: 5.0000 mg | ORAL_TABLET | Freq: Two times a day (BID) | ORAL | 6 refills | Status: DC

## 2019-08-29 MED ORDER — METOPROLOL TARTRATE 25 MG PO TABS: 25.0000 mg | ORAL_TABLET | Freq: Two times a day (BID) | ORAL | 1 refills | Status: DC

## 2019-08-29 MED ORDER — POTASSIUM CHLORIDE CRYS ER 20 MEQ PO TBCR: 20.0000 meq | EXTENDED_RELEASE_TABLET | ORAL | 3 refills | Status: DC

## 2019-08-29 MED ORDER — LISINOPRIL 10 MG PO TABS: 10.0000 mg | ORAL_TABLET | Freq: Two times a day (BID) | ORAL | 3 refills | Status: DC

## 2019-08-29 MED ORDER — EZETIMIBE 10 MG PO TABS: 10.0000 mg | ORAL_TABLET | Freq: Every day | ORAL | 1 refills | Status: DC

## 2019-08-29 MED ORDER — FLUTICASONE PROPIONATE 50 MCG/ACT NA SUSP: 2.0000 | Freq: Every day | NASAL | 6 refills | Status: DC

## 2019-08-29 MED ORDER — ATORVASTATIN CALCIUM 80 MG PO TABS: ORAL_TABLET | ORAL | 1 refills | Status: DC

## 2019-08-29 NOTE — Progress Notes (Signed)
[name], is a 58 y.o. male  YKZ:[id number]  LTJ:[id number]  DOB - March 27, 1961  Subjective:  Chief Complaint and HPI: [name] is a 58 y.o. male here todayfor med RF.  Says his BP yesterday was 138/88.  Denies HA/CP/SOB.  Did not take meds this morning.  Thinks his blood sugars are good.  Only taking metformin once daily.  Compliant with meds.  Declines flu shot.    ROS:   Constitutional:  No f/c, No night sweats, No unexplained weight loss. EENT:  No vision changes, No blurry vision, No hearing changes. No mouth, throat, or ear problems.  Respiratory: No cough, No SOB Cardiac: No CP, no palpitations GI:  No abd pain, No N/V/D. GU: No Urinary s/sx Musculoskeletal: No joint pain Neuro: No headache, no dizziness, no motor weakness.  Skin: No rash Endocrine:  No polydipsia. No polyuria.  Psych: Denies SI/HI  No problems updated.  ALLERGIES: Allergies  Allergen Reactions  . Other Anaphylaxis    mushrooms  . Hydrocodone-Acetaminophen Itching    PAST MEDICAL HISTORY: Past Medical History:  Diagnosis Date  . Allergy   . Anemia   . Anxiety   . Baker's cyst of knee   . Blood transfusion without reported diagnosis    as baby   . Coronary artery disease    quadruple bypass - March 2016  . GERD (gastroesophageal reflux disease)   . Gouty arthritis    "real bad" (01/17/2013)  . Heart murmur   . Hypercholesteremia   . Hypertension   . Myocardial infarction ([facility]) 2017  . Stroke ([facility])   . Type II diabetes mellitus ([facility])     MEDICATIONS AT HOME: Prior to Admission medications   Medication Sig Start Date End Date Taking? Authorizing Provider  allopurinol (ZYLOPRIM) 300 MG tablet Take 1 tablet (300 mg total) by mouth daily. 08/29/19  Yes Milagros Middendorf, [name] M, PA-C  atorvastatin (LIPITOR) 80 MG tablet TAKE ONE TABLET BY MOUTH ONCE DAILY AT  6  PM 08/29/19  Yes Anders Simmonds, PA-C  clopidogrel (PLAVIX) 75 MG tablet Take 1 tablet (75 mg total) by mouth daily. 08/29/19   Yes Anders Simmonds, PA-C  [place] ALLERGY RELIEF 10 MG tablet Take 1 tablet by mouth once daily 03/06/19  Yes Marcine Matar, MD  ezetimibe (ZETIA) 10 MG tablet Take 1 tablet (10 mg total) by mouth daily. 08/29/19  Yes Karlisa Gaubert M, PA-C  fluticasone (FLONASE) 50 MCG/ACT nasal spray Place 2 sprays into both nostrils daily. 08/29/19  Yes Anders Simmonds, PA-C  furosemide (LASIX) 40 MG tablet Take 1 tablet (40 mg total) by mouth every Monday, Wednesday, and Friday. 08/29/19  Yes Georgian Co M, PA-C  metFORMIN (GLUCOPHAGE) 500 MG tablet Take 1 tablet (500 mg total) by mouth daily with breakfast. 1 twice daily with food 08/29/19  Yes Georgian Co M, PA-C  metoprolol tartrate (LOPRESSOR) 25 MG tablet Take 1 tablet (25 mg total) by mouth 2 (two) times daily. 08/29/19  Yes Georgian Co M, PA-C  potassium chloride SA (KLOR-CON M20) 20 MEQ tablet Take 1 tablet (20 mEq total) by mouth every Monday, Wednesday, and Friday. 08/29/19  Yes Georgian Co M, PA-C  lisinopril (ZESTRIL) 10 MG tablet Take 1 tablet (10 mg total) by mouth 2 (two) times daily. 08/29/19   Anders Simmonds, PA-C     Objective:  EXAM:   Vitals:   08/29/19 0846  BP: (!) 189/108  Pulse: 78  Temp: 98.4 F (36.9 C)  TempSrc: Oral  SpO2: 98%  Weight: 224 lb (101.6 kg)  Height: 5\' 6"  (1.676 m)    General appearance : A&OX3. NAD. Non-toxic-appearing HEENT: Atraumatic and Normocephalic.  PERRLA. EOM intact.  TM clear B. Mouth-MMM, post pharynx WNL w/o erythema, No PND. Neck: supple, no JVD. No cervical lymphadenopathy. No thyromegaly Chest/Lungs:  Breathing-non-labored, Good air entry bilaterally, breath sounds normal without rales, rhonchi, or wheezing  CVS: S1 S2 regular, no murmurs, gallops, rubs  Abdomen: Bowel sounds present, Non tender and not distended with no gaurding, rigidity or rebound. Extremities: Bilateral Lower Ext shows no edema, both legs are warm to touch with = pulse throughout Neurology:   CN II-XII grossly intact, Non focal.   Psych:  TP linear. J/I WNL. Normal speech. Appropriate eye contact and affect.  Skin:  No Rash  Data Review Lab Results  Component Value Date   HGBA1C 5.8 (A) 08/29/2019   HGBA1C 6.2 (H) 05/11/2018   HGBA1C 5.9 (A) 01/23/2018     Assessment & Plan   1. Controlled type 2 diabetes mellitus with diabetic nephropathy, without long-term current use of insulin (HCC) Controlled.  Continue to work on diet - metFORMIN (GLUCOPHAGE) 500 MG tablet; Take 1 tablet (500 mg total) by mouth daily with breakfast. 1 twice daily with food  Dispense: 90 tablet; Refill: 1 - Glucose (CBG) - HgB A1c - Lipid panel - Comprehensive metabolic panel  2. Idiopathic gout, unspecified chronicity, unspecified site - allopurinol (ZYLOPRIM) 300 MG tablet; Take 1 tablet (300 mg total) by mouth daily.  Dispense: 90 tablet; Refill: 3  3. Dyslipidemia - Lipid panel - Comprehensive metabolic panel - atorvastatin (LIPITOR) 80 MG tablet; TAKE ONE TABLET BY MOUTH ONCE DAILY AT  6  PM  Dispense: 90 tablet; Refill: 1 - clopidogrel (PLAVIX) 75 MG tablet; Take 1 tablet (75 mg total) by mouth daily.  Dispense: 90 tablet; Refill: 1 - ezetimibe (ZETIA) 10 MG tablet; Take 1 tablet (10 mg total) by mouth daily.  Dispense: 90 tablet; Refill: 1  4. Seasonal allergic rhinitis due to other allergic trigger - fluticasone (FLONASE) 50 MCG/ACT nasal spray; Place 2 sprays into both nostrils daily.  Dispense: 16 g; Refill: 6  5. Type 2 diabetes mellitus without complication, without long-term current use of insulin (HCC) Controlled-continue - metFORMIN (GLUCOPHAGE) 500 MG tablet; Take 1 tablet (500 mg total) by mouth daily with breakfast. 1 twice daily with food  Dispense: 90 tablet; Refill: 1  6. Essential hypertension Not controlled.  Increase lisinopril from 5mg  bid to 10mg  bid - CBC with Differential - furosemide (LASIX) 40 MG tablet; Take 1 tablet (40 mg total) by mouth every Monday,  Wednesday, and Friday.  Dispense: 30 tablet; Refill: 3 - potassium chloride SA (KLOR-CON M20) 20 MEQ tablet; Take 1 tablet (20 mEq total) by mouth every Monday, Wednesday, and Friday.  Dispense: 30 tablet; Refill: 3 - metoprolol tartrate (LOPRESSOR) 25 MG tablet; Take 1 tablet (25 mg total) by mouth 2 (two) times daily.  Dispense: 180 tablet; Refill: 1 - lisinopril (ZESTRIL) 10 MG tablet; Take 1 tablet (10 mg total) by mouth 2 (two) times daily.  Dispense: 90 tablet; Refill: 3   Patient have been counseled extensively about nutrition and exercise  Return in about 3 months (around 11/27/2019) for PCP f/up.  The patient was given clear instructions to go to ER or return to medical center if symptoms don't improve, worsen or new problems develop. The patient verbalized understanding. The patient was told to call to  get lab results if they haven't heard anything in the next week.     Freeman Caldron, PA-C [facility] [place], [place]   08/29/2019, 9:35 AMPatient ID: [name], male   DOB: [birth year], 58 y.o.   MRN: [id number]

## 2019-08-29 NOTE — Progress Notes (Signed)
Pt. Is here for medication refills.  

## 2019-08-29 NOTE — Patient Instructions (Signed)
Check blood pressure 3 to 5 times weekly and record,  Goal < 135/85.  Make appt sooner than 3 months if consistently higher than this.

## 2019-08-30 ENCOUNTER — Telehealth: Payer: Self-pay | Admitting: Internal Medicine

## 2019-08-30 DIAGNOSIS — E1121 Type 2 diabetes mellitus with diabetic nephropathy: Secondary | ICD-10-CM

## 2019-08-30 LAB — COMPREHENSIVE METABOLIC PANEL
ALT: 14 IU/L (ref 0–44)
AST: 35 IU/L (ref 0–40)
Albumin/Globulin Ratio: 1.7 (ref 1.2–2.2)
Albumin: 4.5 g/dL (ref 3.8–4.9)
Alkaline Phosphatase: 117 IU/L (ref 39–117)
BUN/Creatinine Ratio: 17 (ref 9–20)
BUN: 26 mg/dL — ABNORMAL HIGH (ref 6–24)
Bilirubin Total: 0.4 mg/dL (ref 0.0–1.2)
CO2: 17 mmol/L — ABNORMAL LOW (ref 20–29)
Calcium: 9.6 mg/dL (ref 8.7–10.2)
Chloride: 104 mmol/L (ref 96–106)
Creatinine, Ser: 1.57 mg/dL — ABNORMAL HIGH (ref 0.76–1.27)
GFR calc Af Amer: 55 mL/min/{1.73_m2} — ABNORMAL LOW (ref >59)
GFR calc non Af Amer: 48 mL/min/{1.73_m2} — ABNORMAL LOW (ref >59)
Globulin, Total: 2.6 g/dL (ref 1.5–4.5)
Glucose: 109 mg/dL — ABNORMAL HIGH (ref 65–99)
Potassium: 4.9 mmol/L (ref 3.5–5.2)
Sodium: 140 mmol/L (ref 134–144)
Total Protein: 7.1 g/dL (ref 6.0–8.5)

## 2019-08-30 LAB — CBC WITH DIFFERENTIAL/PLATELET
Basophils Absolute: 0.1 10*3/uL (ref 0.0–0.2)
Basos: 1 %
EOS (ABSOLUTE): 0.1 10*3/uL (ref 0.0–0.4)
Eos: 2 %
Hematocrit: 42.1 % (ref 37.5–51.0)
Hemoglobin: 14.1 g/dL (ref 13.0–17.7)
Immature Grans (Abs): 0 10*3/uL (ref 0.0–0.1)
Immature Granulocytes: 0 %
Lymphocytes Absolute: 1.4 10*3/uL (ref 0.7–3.1)
Lymphs: 24 %
MCH: 31.1 pg (ref 26.6–33.0)
MCHC: 33.5 g/dL (ref 31.5–35.7)
MCV: 93 fL (ref 79–97)
Monocytes Absolute: 0.6 10*3/uL (ref 0.1–0.9)
Monocytes: 10 %
Neutrophils Absolute: 3.7 10*3/uL (ref 1.4–7.0)
Neutrophils: 63 %
Platelets: 262 10*3/uL (ref 150–450)
RBC: 4.54 x10E6/uL (ref 4.14–5.80)
RDW: 14.3 % (ref 11.6–15.4)
WBC: 5.9 10*3/uL (ref 3.4–10.8)

## 2019-08-30 LAB — LIPID PANEL
Chol/HDL Ratio: 3.4 ratio (ref 0.0–5.0)
Cholesterol, Total: 145 mg/dL (ref 100–199)
HDL: 43 mg/dL (ref >39)
LDL Chol Calc (NIH): 51 mg/dL (ref 0–99)
Triglycerides: 334 mg/dL — ABNORMAL HIGH (ref 0–149)
VLDL Cholesterol Cal: 51 mg/dL — ABNORMAL HIGH (ref 5–40)

## 2019-08-30 NOTE — Telephone Encounter (Signed)
PC placed to pt today to discuss lab results.  Blood count is nl.  Total and LDL cholesterol was nl.  Kidney function is not 100% and mildly worse compared to 05/2018.  Lisinopril increased to 10 mg BID by PA yesterday.  Pt on Metformin 500 mg daily. Advised not to take any NSAIDS.  Return to lab in 1 wk for recheck kidney function.  Pt agreeable to doing so.

## 2019-09-09 DIAGNOSIS — G4733 Obstructive sleep apnea (adult) (pediatric): Secondary | ICD-10-CM | POA: Diagnosis not present

## 2019-10-10 DIAGNOSIS — G4733 Obstructive sleep apnea (adult) (pediatric): Secondary | ICD-10-CM | POA: Diagnosis not present

## 2019-11-07 DIAGNOSIS — G4733 Obstructive sleep apnea (adult) (pediatric): Secondary | ICD-10-CM | POA: Diagnosis not present

## 2019-11-27 ENCOUNTER — Other Ambulatory Visit: Payer: Self-pay

## 2019-11-27 ENCOUNTER — Ambulatory Visit: Payer: PPO | Attending: Internal Medicine | Admitting: Internal Medicine

## 2019-11-27 DIAGNOSIS — E785 Hyperlipidemia, unspecified: Secondary | ICD-10-CM

## 2019-11-27 DIAGNOSIS — I10 Essential (primary) hypertension: Secondary | ICD-10-CM

## 2019-11-27 DIAGNOSIS — G4733 Obstructive sleep apnea (adult) (pediatric): Secondary | ICD-10-CM

## 2019-11-27 DIAGNOSIS — M1000 Idiopathic gout, unspecified site: Secondary | ICD-10-CM

## 2019-11-27 DIAGNOSIS — Z1211 Encounter for screening for malignant neoplasm of colon: Secondary | ICD-10-CM

## 2019-11-27 DIAGNOSIS — J3089 Other allergic rhinitis: Secondary | ICD-10-CM

## 2019-11-27 DIAGNOSIS — E1169 Type 2 diabetes mellitus with other specified complication: Secondary | ICD-10-CM

## 2019-11-27 DIAGNOSIS — E1159 Type 2 diabetes mellitus with other circulatory complications: Secondary | ICD-10-CM

## 2019-11-27 DIAGNOSIS — I25810 Atherosclerosis of coronary artery bypass graft(s) without angina pectoris: Secondary | ICD-10-CM

## 2019-11-27 DIAGNOSIS — R809 Proteinuria, unspecified: Secondary | ICD-10-CM

## 2019-11-27 MED ORDER — LISINOPRIL 10 MG PO TABS: 10.0000 mg | ORAL_TABLET | Freq: Two times a day (BID) | ORAL | 1 refills | Status: DC

## 2019-11-27 MED ORDER — CLOPIDOGREL BISULFATE 75 MG PO TABS: 75.0000 mg | ORAL_TABLET | Freq: Every day | ORAL | 1 refills | Status: DC

## 2019-11-27 MED ORDER — ATORVASTATIN CALCIUM 80 MG PO TABS: ORAL_TABLET | ORAL | 1 refills | Status: DC

## 2019-11-27 MED ORDER — METFORMIN HCL 500 MG PO TABS: 500.0000 mg | ORAL_TABLET | Freq: Every day | ORAL | 1 refills | Status: DC

## 2019-11-27 MED ORDER — FLUTICASONE PROPIONATE 50 MCG/ACT NA SUSP: 2.0000 | Freq: Every day | NASAL | 6 refills | Status: DC

## 2019-11-27 MED ORDER — EZETIMIBE 10 MG PO TABS: 10.0000 mg | ORAL_TABLET | Freq: Every day | ORAL | 1 refills | Status: DC

## 2019-11-27 MED ORDER — ALLOPURINOL 300 MG PO TABS: 300.0000 mg | ORAL_TABLET | Freq: Every day | ORAL | 3 refills | Status: DC

## 2019-11-27 MED ORDER — METOPROLOL TARTRATE 25 MG PO TABS: 25.0000 mg | ORAL_TABLET | Freq: Two times a day (BID) | ORAL | 1 refills | Status: DC

## 2019-11-27 MED ORDER — FUROSEMIDE 40 MG PO TABS: 40.0000 mg | ORAL_TABLET | ORAL | 5 refills | Status: DC

## 2019-11-27 NOTE — Progress Notes (Signed)
Virtual Visit via Telephone Note Due to current restrictions/limitations of in-office visits due to the COVID-19 pandemic, this scheduled clinical appointment was converted to a telehealth visit  I connected with [name] on 11/27/19 at 12:02 p.m by telephone and verified that I am speaking with the correct person using two identifiers. I am in my office.  The patient is at home.  Only the patient and myself participated in this encounter.  I discussed the limitations, risks, security and privacy concerns of performing an evaluation and management service by telephone and the availability of in person appointments. I also discussed with the patient that there may be a patient responsible charge related to this service. The patient expressed understanding and agreed to proceed.   History of Present Illness: DM, macro-albuminuria, HTN, HL, CAD s/p CABGx4, and bicuspid AV s/p AVR2017, anemia, TIA, atherosclerosis of intracranial arteries, OSA.  Patient last seen by me 02/2019.   DM: checks BS QOD.  Range 80-90s.  Compliant with metformin. Doing better with eating habits.  Thinks he has loss about 6 lbs.  Stays active by walking and washing cars. No numbness in hands or feet. No blurred vision.  Over due for eye exam.  I referred him in June of last year.  He states he was called and was told that they will call him back to schedule a future appointment but he never heard back from them.  HYPERTENSION/CAD/HL Currently taking: see medication list.  Compliant with meds including Lipitor and Plavix Med Adherence: [x]  Yes    []  No Medication side effects: [x]  Yes    []  No Adherence with salt restriction: [x]  Yes    []  No Home Monitoring?: [x]  Yes    []  No Monitoring Frequency: QOD Home BP results range: 120s/80-83 SOB? []  Yes    [x]  No Chest Pain?: []  Yes    [x]  No Leg swelling?: []  Yes    [x]  No Headaches?: []  Yes    [x]  No Dizziness? []  Yes    [x]  No Comments:  No palpitations  OSA:   Still not using CPAP.  States he feels like he is suffocating when he wears it.  I had referred him for mask desensitization in June of last year.  Patient states he was called but was told that they will call him in several weeks to give him an appointment but he never heard back from them.  He is willing to try the mask desensitization.   HM: plans to call today to get schedule COVID vaccine.  Due for Tdap and Pneumovax.  Patient states he will get the vaccines on his next visit.  He is needing refills on most of his medications. Observations/Objective: Results for orders placed or performed in visit on 08/29/19  Lipid panel  Result Value Ref Range   Cholesterol, Total 145 100 - 199 mg/dL   Triglycerides (H) 0 - 149 mg/dL   HDL 43 mg/dL   VLDL Cholesterol Cal 51 (H) 5 - 40 mg/dL   LDL Chol Calc (NIH) 51 0 - 99 mg/dL   Chol/HDL Ratio 3.4 0.0 - 5.0 ratio  Comprehensive metabolic panel  Result Value Ref Range   Glucose 109 (H) 65 - 99 mg/dL   BUN 26 (H) 6 - 24 mg/dL   Creatinine, Ser (H) 0.76 - 1.27 mg/dL   GFR calc non Af Amer 48 (L) >59 mL/min/1.73   GFR calc Af Amer 55 (L) >59 mL/min/1.73   BUN/Creatinine Ratio 17 9 -  20   Sodium 140 134 - 144 mmol/L   Potassium 4.9 3.5 - 5.2 mmol/L   Chloride 104 96 - 106 mmol/L   CO2 17 (L) 20 - 29 mmol/L   Calcium 9.6 8.7 - 10.2 mg/dL   Total Protein 7.1 6.0 - 8.5 g/dL   Albumin 4.5 3.8 - 4.9 g/dL   Globulin, Total 2.6 1.5 - 4.5 g/dL   Albumin/Globulin Ratio 1.7 1.2 - 2.2   Bilirubin Total 0.4 0.0 - 1.2 mg/dL   Alkaline Phosphatase 117 39 - 117 IU/L   AST 35 0 - 40 IU/L   ALT 14 0 - 44 IU/L  CBC with Differential  Result Value Ref Range   WBC 5.9 3.4 - 10.8 x10E3/uL   RBC 4.54 4.14 - 5.80 x10E6/uL   Hemoglobin 14.1 13.0 - 17.7 g/dL   Hematocrit 17.9 15.0 - 51.0 %   MCV 93 79 - 97 fL   MCH 31.1 26.6 - 33.0 pg   MCHC 33.5 31.5 - 35.7 g/dL   RDW 56.9 79.4 - 80.1 %   Platelets 262 150 - 450 x10E3/uL   Neutrophils 63 Not  Estab. %   Lymphs 24 Not Estab. %   Monocytes 10 Not Estab. %   Eos 2 Not Estab. %   Basos 1 Not Estab. %   Neutrophils Absolute 3.7 1.4 - 7.0 x10E3/uL   Lymphocytes Absolute 1.4 0.7 - 3.1 x10E3/uL   Monocytes Absolute 0.6 0.1 - 0.9 x10E3/uL   EOS (ABSOLUTE) 0.1 0.0 - 0.4 x10E3/uL   Basophils Absolute 0.1 0.0 - 0.2 x10E3/uL   Immature Granulocytes 0 Not Estab. %   Immature Grans (Abs) 0.0 0.0 - 0.1 x10E3/uL  Glucose (CBG)  Result Value Ref Range   POC Glucose 130 (A) 70 - 99 mg/dl  HgB K5V  Result Value Ref Range   Hemoglobin A1C 5.8 (A) 4.0 - 5.6 %   HbA1c POC (<> result, manual entry)     HbA1c, POC (prediabetic range)     HbA1c, POC (controlled diabetic range)       Assessment and Plan: 1. Type 2 diabetes mellitus with other circulatory complication, without long-term current use of insulin (HCC) Reported home blood sugar readings are at goal.  His last A1c was 5.8.  He will continue Metformin, healthy eating habits and regular exercise - metFORMIN (GLUCOPHAGE) 500 MG tablet; Take 1 tablet (500 mg total) by mouth daily with breakfast.  Dispense: 90 tablet; Refill: 1 - Ambulatory referral to Ophthalmology - Basic metabolic panel; Future  2. Essential hypertension Close to goal with diastolic readings slightly above 80.  No changes made in dose of medicines.  Of note his lisinopril was increased to 10 mg twice a day on his visit with the PA in December of last year. - furosemide (LASIX) 40 MG tablet; Take 1 tablet (40 mg total) by mouth every Monday, Wednesday, and Friday.  Dispense: 30 tablet; Refill: 5 - lisinopril (ZESTRIL) 10 MG tablet; Take 1 tablet (10 mg total) by mouth 2 (two) times daily.  Dispense: 180 tablet; Refill: 1 - metoprolol tartrate (LOPRESSOR) 25 MG tablet; Take 1 tablet (25 mg total) by mouth 2 (two) times daily.  Dispense: 180 tablet; Refill: 1  3. Coronary artery disease involving coronary bypass graft of native heart without angina pectoris Clinically  stable.  Continue current medications - atorvastatin (LIPITOR) 80 MG tablet; TAKE ONE TABLET BY MOUTH ONCE DAILY AT  6  PM  Dispense: 90 tablet; Refill: 1 -  clopidogrel (PLAVIX) 75 MG tablet; Take 1 tablet (75 mg total) by mouth daily.  Dispense: 90 tablet; Refill: 1 - ezetimibe (ZETIA) 10 MG tablet; Take 1 tablet (10 mg total) by mouth daily.  Dispense: 90 tablet; Refill: 1  4. Hyperlipidemia associated with type 2 diabetes mellitus (HCC) - atorvastatin (LIPITOR) 80 MG tablet; TAKE ONE TABLET BY MOUTH ONCE DAILY AT  6  PM  Dispense: 90 tablet; Refill: 1 - ezetimibe (ZETIA) 10 MG tablet; Take 1 tablet (10 mg total) by mouth daily.  Dispense: 90 tablet; Refill: 1  5. Positive for macroalbuminuria Continue lisinopril GFR was decreased into the 50s when last checked in December of last year. - Microalbumin / creatinine urine ratio; Future  6. OSA (obstructive sleep apnea) - Desensitization mask fit; Future  7. Idiopathic gout, unspecified chronicity, unspecified site - allopurinol (ZYLOPRIM) 300 MG tablet; Take 1 tablet (300 mg total) by mouth daily.  Dispense: 90 tablet; Refill: 3  8. Seasonal allergic rhinitis due to other allergic trigger - fluticasone (FLONASE) 50 MCG/ACT nasal spray; Place 2 sprays into both nostrils daily.  Dispense: 16 g; Refill: 6  9.  Colon cancer screening -Patient reports having submitted what sounds like a fit test several months ago but we never received the results.  He is agreeable to doing the Cologuard.  Follow Up Instructions: 4 mths   I discussed the assessment and treatment plan with the patient. The patient was provided an opportunity to ask questions and all were answered. The patient agreed with the plan and demonstrated an understanding of the instructions.   The patient was advised to call back or seek an in-person evaluation if the symptoms worsen or if the condition fails to improve as anticipated.  I provided 12 minutes of non-face-to-face  time during this encounter.   Karle Plumber, MD

## 2019-11-29 ENCOUNTER — Ambulatory Visit: Payer: PPO | Attending: Internal Medicine

## 2019-11-29 DIAGNOSIS — Z23 Encounter for immunization: Secondary | ICD-10-CM

## 2019-11-29 NOTE — Progress Notes (Signed)
   Covid-19 Vaccination Clinic  Name:  Donevin Sainsbury    MRN: [id number] DOB: 06-16-1961  11/29/2019  Mr. Lepera was observed post Covid-19 immunization for 15 minutes without incident. He was provided with Vaccine Information Sheet and instruction to access the V-Safe system.   Mr. Evilsizer was instructed to call 911 with any severe reactions post vaccine: Marland Kitchen Difficulty breathing  . Swelling of face and throat  . A fast heartbeat  . A bad rash all over body  . Dizziness and weakness   Immunizations Administered    Name Date Dose VIS Date Route   Pfizer COVID-19 Vaccine 11/29/2019  9:43 AM 0.3 mL 08/17/2019 Intramuscular   Manufacturer: [employer], [employer]   Lot: [id number]   NDC: [address]-9

## 2019-12-15 ENCOUNTER — Other Ambulatory Visit: Payer: Self-pay | Admitting: Internal Medicine

## 2019-12-15 DIAGNOSIS — E1159 Type 2 diabetes mellitus with other circulatory complications: Secondary | ICD-10-CM

## 2019-12-24 ENCOUNTER — Ambulatory Visit: Payer: PPO | Attending: Internal Medicine

## 2019-12-24 DIAGNOSIS — Z23 Encounter for immunization: Secondary | ICD-10-CM

## 2019-12-24 NOTE — Progress Notes (Signed)
   Covid-19 Vaccination Clinic  Name:  [name]    MRN: [id number] DOB: Oct 20, 1960  12/24/2019  [name] was observed post Covid-19 immunization for 15 minutes without incident. He was provided with Vaccine Information Sheet and instruction to access the V-Safe system.   [name] was instructed to call 911 with any severe reactions post vaccine: Marland Kitchen Difficulty breathing  . Swelling of face and throat  . A fast heartbeat  . A bad rash all over body  . Dizziness and weakness   Immunizations Administered    Name Date Dose VIS Date Route   Pfizer COVID-19 Vaccine 12/24/2019  9:24 AM 0.3 mL 10/31/2018 Intramuscular   Manufacturer: [employer], [employer]   Lot: [id number]   NDC: [address]-1

## 2020-01-30 ENCOUNTER — Telehealth: Payer: Self-pay | Admitting: Internal Medicine

## 2020-01-30 NOTE — Telephone Encounter (Signed)
Pt states he hasn't done it and he is going to take his time doing it because he doesn't want it to come out that he has cancer. Pt states it will do but not now

## 2020-03-28 ENCOUNTER — Ambulatory Visit: Payer: PPO | Admitting: Internal Medicine

## 2020-06-13 ENCOUNTER — Telehealth: Payer: Self-pay | Admitting: Internal Medicine

## 2020-06-13 NOTE — Telephone Encounter (Signed)
Called patient and LVM to return call and schedule an AVW with PCP. If patient calls back please schedule an appt.

## 2020-09-17 ENCOUNTER — Other Ambulatory Visit: Payer: Self-pay | Admitting: Physician Assistant

## 2020-09-17 ENCOUNTER — Other Ambulatory Visit: Payer: Self-pay | Admitting: Internal Medicine

## 2020-09-17 DIAGNOSIS — I25810 Atherosclerosis of coronary artery bypass graft(s) without angina pectoris: Secondary | ICD-10-CM

## 2020-09-17 DIAGNOSIS — E1169 Type 2 diabetes mellitus with other specified complication: Secondary | ICD-10-CM

## 2020-09-17 DIAGNOSIS — J3089 Other allergic rhinitis: Secondary | ICD-10-CM

## 2020-09-17 DIAGNOSIS — E1159 Type 2 diabetes mellitus with other circulatory complications: Secondary | ICD-10-CM

## 2020-09-17 DIAGNOSIS — I10 Essential (primary) hypertension: Secondary | ICD-10-CM

## 2020-10-09 ENCOUNTER — Other Ambulatory Visit: Payer: Self-pay

## 2020-10-09 ENCOUNTER — Encounter: Payer: Self-pay | Admitting: Internal Medicine

## 2020-10-09 ENCOUNTER — Ambulatory Visit: Payer: PPO | Attending: Internal Medicine | Admitting: Internal Medicine

## 2020-10-09 VITALS — BP 160/100 | HR 76 | Temp 98.3°F | Resp 16 | Ht 66.0 in | Wt 209.4 lb

## 2020-10-09 DIAGNOSIS — I10 Essential (primary) hypertension: Secondary | ICD-10-CM

## 2020-10-09 DIAGNOSIS — E785 Hyperlipidemia, unspecified: Secondary | ICD-10-CM | POA: Diagnosis not present

## 2020-10-09 DIAGNOSIS — Z2821 Immunization not carried out because of patient refusal: Secondary | ICD-10-CM | POA: Diagnosis not present

## 2020-10-09 DIAGNOSIS — B353 Tinea pedis: Secondary | ICD-10-CM

## 2020-10-09 DIAGNOSIS — E1169 Type 2 diabetes mellitus with other specified complication: Secondary | ICD-10-CM | POA: Diagnosis not present

## 2020-10-09 DIAGNOSIS — Z1211 Encounter for screening for malignant neoplasm of colon: Secondary | ICD-10-CM

## 2020-10-09 DIAGNOSIS — G4733 Obstructive sleep apnea (adult) (pediatric): Secondary | ICD-10-CM | POA: Diagnosis not present

## 2020-10-09 DIAGNOSIS — R809 Proteinuria, unspecified: Secondary | ICD-10-CM | POA: Diagnosis not present

## 2020-10-09 DIAGNOSIS — J3089 Other allergic rhinitis: Secondary | ICD-10-CM | POA: Diagnosis not present

## 2020-10-09 DIAGNOSIS — I25810 Atherosclerosis of coronary artery bypass graft(s) without angina pectoris: Secondary | ICD-10-CM

## 2020-10-09 DIAGNOSIS — E669 Obesity, unspecified: Secondary | ICD-10-CM | POA: Diagnosis not present

## 2020-10-09 DIAGNOSIS — Z8739 Personal history of other diseases of the musculoskeletal system and connective tissue: Secondary | ICD-10-CM

## 2020-10-09 DIAGNOSIS — E1159 Type 2 diabetes mellitus with other circulatory complications: Secondary | ICD-10-CM

## 2020-10-09 LAB — POCT GLYCOSYLATED HEMOGLOBIN (HGB A1C): HbA1c, POC (controlled diabetic range): 5.4 % (ref 0.0–7.0)

## 2020-10-09 LAB — GLUCOSE, POCT (MANUAL RESULT ENTRY): POC Glucose: 112 mg/dl — AB (ref 70–99)

## 2020-10-09 MED ORDER — ALLOPURINOL 300 MG PO TABS: 300.0000 mg | ORAL_TABLET | Freq: Every day | ORAL | 1 refills | Status: DC

## 2020-10-09 MED ORDER — POTASSIUM CHLORIDE CRYS ER 20 MEQ PO TBCR
EXTENDED_RELEASE_TABLET | ORAL | 1 refills | Status: DC
Start: 2020-10-09 — End: 2021-10-13

## 2020-10-09 MED ORDER — CLOPIDOGREL BISULFATE 75 MG PO TABS
75.0000 mg | ORAL_TABLET | Freq: Every day | ORAL | 1 refills | Status: DC
Start: 2020-10-09 — End: 2021-09-23

## 2020-10-09 MED ORDER — LISINOPRIL 10 MG PO TABS: ORAL_TABLET | ORAL | 1 refills | Status: DC

## 2020-10-09 MED ORDER — ATORVASTATIN CALCIUM 80 MG PO TABS: ORAL_TABLET | ORAL | 6 refills | Status: DC

## 2020-10-09 MED ORDER — METFORMIN HCL 500 MG PO TABS: 250.0000 mg | ORAL_TABLET | Freq: Every day | ORAL | 6 refills | Status: DC

## 2020-10-09 MED ORDER — EZETIMIBE 10 MG PO TABS: 10.0000 mg | ORAL_TABLET | Freq: Every day | ORAL | 1 refills | Status: DC

## 2020-10-09 MED ORDER — METOPROLOL TARTRATE 25 MG PO TABS: 25.0000 mg | ORAL_TABLET | Freq: Two times a day (BID) | ORAL | 1 refills | Status: DC

## 2020-10-09 MED ORDER — FUROSEMIDE 40 MG PO TABS: 40.0000 mg | ORAL_TABLET | ORAL | 5 refills | Status: DC

## 2020-10-09 MED ORDER — FLUTICASONE PROPIONATE 50 MCG/ACT NA SUSP: NASAL | 1 refills | Status: DC

## 2020-10-09 NOTE — Progress Notes (Signed)
Patient ID: [name], male    DOB: 11/22/1960  MRN: [id number]  CC: Diabetes and Hypertension   Subjective: [name] is a 60 y.o. male who presents for chronic ds mamagement His concerns today include:  DM,macro-albuminuria,HTN, HL, CAD s/p CABGx4, and bicuspid AV s/p AVR2017, anemia, TIA,atherosclerosis of intracranial arteries, OSA.    DIABETES TYPE 2 Last A1C:   Results for orders placed or performed in visit on 10/09/20  POCT glycosylated hemoglobin (Hb A1C)  Result Value Ref Range   Hemoglobin A1C     HbA1c POC (<> result, manual entry)     HbA1c, POC (prediabetic range)     HbA1c, POC (controlled diabetic range) 5.4 0.0 - 7.0 %  POCT glucose (manual entry)  Result Value Ref Range   POC Glucose 112 (A) 70 - 99 mg/dl    Med Adherence:  _0  Yes. Reports he has done a lot to lose wgh so that he can get off Metformin Medication side effects:  _1  Yes    _2  No Home Monitoring?  _3  Yes -2-3 times a mth Home glucose results range: Diet Adherence: _4  Yes  -eating much less than before.  Loss 15 lbs since 08/2019 Exercise: _5  Yes  -going to [employer] 3 x  [place] - walk and lift wghs Hypoglycemic episodes?: _6  Yes    _7  No Numbness of the feet? _8  Yes    _9  No Retinopathy hx? _10  Yes    _11  No Last eye exam:  Comments:   HYPERTENSION/CAD Currently taking: see medication list.  He is on lisinopril, furosemide, metoprolol. Med Adherence: _12  Yes    _13  No Medication side effects: _14  Yes    _15  No Adherence with salt restriction: _16  Yes    _17  No Home Monitoring?: _18  Yes    _19  No Monitoring Frequency: Reports a nurse comes to his house about every 3 mths to check BP and readings have been good Home BP results range: _20  Yes    _21  No SOB? _22  Yes    _23  No Chest Pain?: _24  Yes    _25  No Leg swelling?: _26  Yes    _27  No Headaches?: _28  Yes    _29  No Dizziness? _30  Yes    _31  No Comments: over due for f/u with cardiology  OSA: Still not using his CPAP.  He feels  smothered when he use of the mask.  "I can't do it."  We tried to get him in for mask desensitization when I spoke with him last.  He is willing to give it a try.  GOUT:  No recent flares.  He continues on allopurinol.  HM: Colon CA screen -Cologuard ordered last year.  He reports he never received the kit.  Would like for it to be sent to him.  Had COVID booster 09/04/20 Declines Pneumovax and tdap Patient Active Problem List   Diagnosis Date Noted  . OSA on CPAP 02/06/2019  . Macroalbuminuric diabetic nephropathy ([place]) 02/06/2019  . TIA (transient ischemic attack) 04/10/2018  . Colon cancer screening 05/04/2017  . CVA (cerebral infarction) 12/11/2015  . Acute ischemic VBA thalamic stroke ([place])   . Atherosclerosis of native coronary artery of native heart without angina pectoris   . H/O heart bypass surgery   . Benign essential HTN   . Type 2 diabetes mellitus without complication, without long-term current use of insulin ([place])   . S/P AVR 11/25/2015  . CAD (coronary artery disease)   . Chronic periodontitis 11/21/2015  .  Bicuspid aortic valve   . Controlled type 2 diabetes mellitus with diabetic nephropathy, without long-term current use of insulin ([place])   . Essential hypertension 02/24/2015  . Dyslipidemia 02/24/2015  . GERD (gastroesophageal reflux disease) 05/02/2013  . Primary gout 05/02/2013  . Allergic rhinitis due to allergen 07/09/2010     Current Outpatient Medications on File Prior to Visit  Medication Sig Dispense Refill  . metFORMIN (GLUCOPHAGE) 500 MG tablet Take 1 tablet by mouth once daily with breakfast 90 tablet 0  . allopurinol (ZYLOPRIM) 300 MG tablet Take 1 tablet (300 mg total) by mouth daily. 90 tablet 3  . atorvastatin (LIPITOR) 80 MG tablet TAKE 1 TABLET BY MOUTH ONCE DAILY 6 IN THE EVENING 15 tablet 0  . clopidogrel (PLAVIX) 75 MG tablet Take 1 tablet by mouth once daily 15 tablet 0  . EQ ALLERGY RELIEF 10 MG tablet Take 1 tablet by mouth once daily 30  tablet 2  . ezetimibe (ZETIA) 10 MG tablet Take 1 tablet (10 mg total) by mouth daily. 90 tablet 1  . fluticasone (FLONASE) 50 MCG/ACT nasal spray Use 2 spray(s) in each nostril once daily 16 g 0  . furosemide (LASIX) 40 MG tablet Take 1 tablet (40 mg total) by mouth every Monday, Wednesday, and Friday. 30 tablet 5  . lisinopril (ZESTRIL) 10 MG tablet Take 1 tablet (10 mg total) by mouth 2 (two) times daily. 180 tablet 1  . metoprolol tartrate (LOPRESSOR) 25 MG tablet Take 1 tablet (25 mg total) by mouth 2 (two) times daily. 180 tablet 1  . potassium chloride SA (KLOR-CON) 20 MEQ tablet TAKE ONE TABLET BY MOUTH EVERY MONDAY, WEDNESDAY AND FRIDAY 15 tablet 0   No current facility-administered medications on file prior to visit.    Allergies  Allergen Reactions  . Other Anaphylaxis    mushrooms  . Hydrocodone-Acetaminophen Itching    Social History   Socioeconomic History  . Marital status: Married    Spouse name: Not on file  . Number of children: Not on file  . Years of education: Not on file  . Highest education level: Not on file  Occupational History  . Not on file  Tobacco Use  . Smoking status: Never Smoker  . Smokeless tobacco: Never Used  Vaping Use  . Vaping Use: Never used  Substance and Sexual Activity  . Alcohol use: Yes    Comment: occasionally  . Drug use: Yes    Types: Marijuana    Comment: pt states occasionally  . Sexual activity: Yes  Other Topics Concern  . Not on file  Social History Narrative  . Not on file   Social Determinants of Health   Financial Resource Strain: Not on file  Food Insecurity: Not on file  Transportation Needs: Not on file  Physical Activity: Not on file  Stress: Not on file  Social Connections: Not on file  Intimate Partner Violence: Not on file    Family History  Problem Relation Age of Onset  . Diabetes Father   . Hypertension Father   . Hypertension Mother   . Hypertension Sister   . Hypertension Brother   .  Hypertension Brother   . Hypertension Brother   . Colon cancer Neg Hx   . Esophageal cancer Neg Hx   . Rectal cancer Neg Hx   . Stomach cancer Neg Hx   . Colon polyps Neg Hx     Past Surgical History:  Procedure Laterality Date  . AORTIC VALVE  REPLACEMENT N/A 11/25/2015   Procedure: AORTIC VALVE REPLACEMENT (AVR) USING A 23MM MAGNA EASE ;  Surgeon: Gaye Pollack, MD;  Location: [facility];  Service: Open Heart Surgery;  Laterality: N/A;  . CARDIAC CATHETERIZATION N/A 11/19/2015   Procedure: Right/Left Heart Cath and Coronary Angiography;  Surgeon: Belva Crome, MD;  Location: [facility];  Service: Cardiovascular;  Laterality: N/A;  . COLONOSCOPY  2004  . CORONARY ARTERY BYPASS GRAFT N/A 11/25/2015   Procedure: CORONARY ARTERY BYPASS GRAFTING (CABG)x4 LIMA-LAD; SVG-OM; SVG-RCA; SVG-DIAG;  Surgeon: Gaye Pollack, MD;  Location: [facility];  Service: Open Heart Surgery;  Laterality: N/A;  . CYST REMOVAL TRUNK N/A 08/24/2016   Procedure: EXCISION OF BACK ABSCESS;  Surgeon: Judeth Horn, MD;  Location: [facility];  Service: General;  Laterality: N/A;  . FINGER AMPUTATION Right 1980's   3rd & 4th digits "got them mashed" (01/17/2013)  . KNEE ARTHROSCOPY Right 1980's  . MULTIPLE EXTRACTIONS WITH ALVEOLOPLASTY Bilateral 11/21/2015   Procedure: Extraction of tooth #'s 2,12 with alveoloplasty and gross debridement of remaining dentition;  Surgeon: Lenn Cal, DDS;  Location: [facility];  Service: Oral Surgery;  Laterality: Bilateral;  . TEE WITHOUT CARDIOVERSION N/A 11/20/2015   Procedure: TRANSESOPHAGEAL ECHOCARDIOGRAM (TEE);  Surgeon: Larey Dresser, MD;  Location: [facility];  Service: Cardiovascular;  Laterality: N/A;  . TEE WITHOUT CARDIOVERSION N/A 11/25/2015   Procedure: TRANSESOPHAGEAL ECHOCARDIOGRAM (TEE);  Surgeon: Gaye Pollack, MD;  Location: [facility];  Service: Open Heart Surgery;  Laterality: N/A;    ROS: Review of Systems Negative except as stated above  PHYSICAL  EXAM: BP (!) 179/109   Pulse 76   Temp 98.3 F (36.8 C)   Resp 16   Ht _0  (1.676 m)   Wt 209 lb 6.4 oz (95 kg)   SpO2 98%   BMI 33.80 kg/m   Wt Readings from Last 3 Encounters:  10/09/20 209 lb 6.4 oz (95 kg)  08/29/19 224 lb (101.6 kg)  07/19/18 236 lb 8 oz (107.3 kg)    Physical Exam   General appearance - alert, well appearing, and in no distress Mental status - normal mood, behavior, speech, dress, motor activity, and thought processes Neck - supple, no significant adenopathy Chest - clear to auscultation, no wheezes, rales or rhonchi, symmetric air entry Heart - normal rate, regular rhythm, normal S1, S2, no murmurs, rubs, clicks or gallops Extremities - peripheral pulses normal, no pedal edema, no clubbing or cyanosis Diabetic Foot Exam - Simple   Simple Foot Form Visual Inspection See comments: Yes Sensation Testing Intact to touch and monofilament testing bilaterally: Yes Pulse Check Posterior Tibialis and Dorsalis pulse intact bilaterally: Yes Comments Hyperpigmentation of the dorsal surface of the toes. Hypopigmentation with moisture between the toes consistent with tinea pedis.  Toenails are thickened and discolored.     CMP Latest Ref Rng & Units 08/29/2019 05/23/2018 04/12/2018  Glucose 65 - 99 mg/dL 109(H) 108(H) 133(H)  BUN 6 - 24 mg/dL 26(H) 29(H) 27(H)  Creatinine 0.76 - 1.27 mg/dL 1.57(H) 1.46(H) 1.24  Sodium 134 - 144 mmol/L 140 144 136  Potassium 3.5 - 5.2 mmol/L 4.9 5.0 4.4  Chloride 96 - 106 mmol/L 104 109(H) 108  CO2 20 - 29 mmol/L 17(L) 18(L) 19(L)  Calcium 8.7 - 10.2 mg/dL 9.6 9.4 9.3  Total Protein 6.0 - 8.5 g/dL 7.1 - -  Total Bilirubin 0.0 - 1.2 mg/dL 0.4 - -  Alkaline Phos 39 - 117  IU/L 117 - -  AST 0 - 40 IU/L 35 - -  ALT 0 - 44 IU/L 14 - -   Lipid Panel     Component Value Date/Time   CHOL 145 08/29/2019 0923   TRIG 334 (H) 08/29/2019 0923   HDL 43 08/29/2019 0923   CHOLHDL 3.4 08/29/2019 0923   CHOLHDL 5.4 12/11/2015 0609    VLDL 35 12/11/2015 0609   LDLCALC 51 08/29/2019 0923    CBC    Component Value Date/Time   WBC 5.9 08/29/2019 0923   WBC 8.7 04/10/2018 1648   RBC 4.54 08/29/2019 0923   RBC 4.41 04/10/2018 1648   HGB 14.1 08/29/2019 0923   HCT 42.1 08/29/2019 0923   PLT 262 08/29/2019 0923   MCV 93 08/29/2019 0923   MCH 31.1 08/29/2019 0923   MCH 30.6 04/10/2018 1648   MCHC 33.5 08/29/2019 0923   MCHC 33.9 04/10/2018 1648   RDW 14.3 08/29/2019 0923   LYMPHSABS 1.4 08/29/2019 0923   MONOABS 0.9 04/10/2018 1648   EOSABS 0.1 08/29/2019 0923   BASOSABS 0.1 08/29/2019 0923    ASSESSMENT AND PLAN: 1. Type 2 diabetes mellitus with obesity ([place]) Commended him on good control of his diabetes and his weight loss so far.  Encouraged him to continue healthy eating habits and regular exercise We will have him decrease the Metformin to half a tablet daily. - POCT glycosylated hemoglobin (Hb A1C) - POCT glucose (manual entry) - Lipid panel - CBC with Differential/Platelet - Ambulatory referral to Ophthalmology - metFORMIN (GLUCOPHAGE) 500 MG tablet; Take 0.5 tablets (250 mg total) by mouth daily with breakfast.  Dispense: 30 tablet; Refill: 6 - Comprehensive metabolic panel - Ambulatory referral to Podiatry  2. Essential hypertension Not at goal.  Recommend increasing lisinopril to 20 mg in the morning and 10 mg in the evening.  Follow-up with clinical pharmacist in 2 weeks for repeat blood pressure check. - metoprolol tartrate (LOPRESSOR) 25 MG tablet; Take 1 tablet (25 mg total) by mouth 2 (two) times daily.  Dispense: 180 tablet; Refill: 1 - potassium chloride SA (KLOR-CON) 20 MEQ tablet; TAKE ONE TABLET BY MOUTH EVERY MONDAY, WEDNESDAY AND FRIDAY  Dispense: 36 tablet; Refill: 1 - furosemide (LASIX) 40 MG tablet; Take 1 tablet (40 mg total) by mouth every Monday, Wednesday, and Friday.  Dispense: 30 tablet; Refill: 5 - lisinopril (ZESTRIL) 10 MG tablet; 2 tabs PO Q a.m and 1 tab PO Q p.m  Dispense:  270 tablet; Refill: 1  3. Positive for macroalbuminuria - Microalbumin / creatinine urine ratio  4. Hyperlipidemia associated with type 2 diabetes mellitus (HCC) Continue atorvastatin and Zetia  5. Coronary artery disease involving coronary bypass graft of native heart without angina pectoris Clinically stable.  Continue statin, metoprolol, Plavix - atorvastatin (LIPITOR) 80 MG tablet; TAKE 1 TABLET BY MOUTH ONCE DAILY 6 IN THE EVENING  Dispense: 30 tablet; Refill: 6 - clopidogrel (PLAVIX) 75 MG tablet; Take 1 tablet (75 mg total) by mouth daily.  Dispense: 90 tablet; Refill: 1 - ezetimibe (ZETIA) 10 MG tablet; Take 1 tablet (10 mg total) by mouth daily.  Dispense: 90 tablet; Refill: 1 - Ambulatory referral to Cardiology  6. Influenza vaccine refused   7. History of gout - allopurinol (ZYLOPRIM) 300 MG tablet; Take 1 tablet (300 mg total) by mouth daily.  Dispense: 90 tablet; Refill: 1  8. Seasonal allergic rhinitis due to other allergic trigger - fluticasone (FLONASE) 50 MCG/ACT nasal spray; Use 2 spray(s) in each  nostril once daily  Dispense: 16 g; Refill: 1  9. Tinea pedis of both feet Recommend keeping the feet clean and dry.  Purchase and use Tinactin powder from over-the-counter daily  10. 23-polyvalent pneumococcal polysaccharide vaccine declined   11. Tetanus, diphtheria, and acellular pertussis (Tdap) vaccination declined   12. Screening for colon cancer - Cologuard  13. OSA (obstructive sleep apnea) - Desensitization mask fit; Future      Patient was given the opportunity to ask questions.  Patient verbalized understanding of the plan and was able to repeat key elements of the plan.   Orders Placed This Encounter  Procedures  . POCT glycosylated hemoglobin (Hb A1C)  . POCT glucose (manual entry)     Requested Prescriptions    No prescriptions requested or ordered in this encounter    No follow-ups on file.  Karle Plumber, MD, FACP

## 2020-10-09 NOTE — Patient Instructions (Signed)
We can cut the Metformin in half.  You will now take half a tablet daily.  Your blood pressure is not at goal.  I recommend taking the lisinopril 10 mg 2 tablets in the morning and 1 tablet in the evening.  Follow-up with the clinical pharmacist in 2 weeks for blood pressure recheck.  Purchase Tinactin powder over-the-counter and use it between the toes to help get rid of the fungal infection between your toes.  I have referred you to the podiatrist also.

## 2020-10-10 ENCOUNTER — Other Ambulatory Visit: Payer: Self-pay | Admitting: Internal Medicine

## 2020-10-10 DIAGNOSIS — R809 Proteinuria, unspecified: Secondary | ICD-10-CM

## 2020-10-10 DIAGNOSIS — E669 Obesity, unspecified: Secondary | ICD-10-CM

## 2020-10-10 DIAGNOSIS — E1169 Type 2 diabetes mellitus with other specified complication: Secondary | ICD-10-CM

## 2020-10-10 LAB — CBC WITH DIFFERENTIAL/PLATELET
Basophils Absolute: 0.1 10*3/uL (ref 0.0–0.2)
Basos: 1 %
EOS (ABSOLUTE): 0.2 10*3/uL (ref 0.0–0.4)
Eos: 3 %
Hematocrit: 44.3 % (ref 37.5–51.0)
Hemoglobin: 14.5 g/dL (ref 13.0–17.7)
Immature Grans (Abs): 0 10*3/uL (ref 0.0–0.1)
Immature Granulocytes: 0 %
Lymphocytes Absolute: 2.1 10*3/uL (ref 0.7–3.1)
Lymphs: 32 %
MCH: 30.6 pg (ref 26.6–33.0)
MCHC: 32.7 g/dL (ref 31.5–35.7)
MCV: 94 fL (ref 79–97)
Monocytes Absolute: 0.8 10*3/uL (ref 0.1–0.9)
Monocytes: 12 %
Neutrophils Absolute: 3.6 10*3/uL (ref 1.4–7.0)
Neutrophils: 52 %
Platelets: 274 10*3/uL (ref 150–450)
RBC: 4.74 x10E6/uL (ref 4.14–5.80)
RDW: 12.8 % (ref 11.6–15.4)
WBC: 6.8 10*3/uL (ref 3.4–10.8)

## 2020-10-10 LAB — COMPREHENSIVE METABOLIC PANEL
ALT: 10 IU/L (ref 0–44)
AST: 26 IU/L (ref 0–40)
Albumin/Globulin Ratio: 1.5 (ref 1.2–2.2)
Albumin: 4 g/dL (ref 3.8–4.8)
Alkaline Phosphatase: 127 IU/L — ABNORMAL HIGH (ref 44–121)
BUN/Creatinine Ratio: 17 (ref 10–24)
BUN: 24 mg/dL (ref 8–27)
Bilirubin Total: 0.3 mg/dL (ref 0.0–1.2)
CO2: 19 mmol/L — ABNORMAL LOW (ref 20–29)
Calcium: 9 mg/dL (ref 8.6–10.2)
Chloride: 110 mmol/L — ABNORMAL HIGH (ref 96–106)
Creatinine, Ser: 1.4 mg/dL — ABNORMAL HIGH (ref 0.76–1.27)
GFR calc Af Amer: 62 mL/min/{1.73_m2} (ref >59)
GFR calc non Af Amer: 54 mL/min/{1.73_m2} — ABNORMAL LOW (ref >59)
Globulin, Total: 2.7 g/dL (ref 1.5–4.5)
Glucose: 100 mg/dL — ABNORMAL HIGH (ref 65–99)
Potassium: 4.4 mmol/L (ref 3.5–5.2)
Sodium: 142 mmol/L (ref 134–144)
Total Protein: 6.7 g/dL (ref 6.0–8.5)

## 2020-10-10 LAB — MICROALBUMIN / CREATININE URINE RATIO
Creatinine, Urine: 85.9 mg/dL
Microalb/Creat Ratio: 4285 mg/g creat — ABNORMAL HIGH (ref 0–29)
Microalbumin, Urine: 3680.5 ug/mL

## 2020-10-10 LAB — LIPID PANEL
Chol/HDL Ratio: 3.2 ratio (ref 0.0–5.0)
Cholesterol, Total: 175 mg/dL (ref 100–199)
HDL: 55 mg/dL (ref >39)
LDL Chol Calc (NIH): 83 mg/dL (ref 0–99)
Triglycerides: 221 mg/dL — ABNORMAL HIGH (ref 0–149)
VLDL Cholesterol Cal: 37 mg/dL (ref 5–40)

## 2020-10-10 NOTE — Progress Notes (Signed)
Patient know that he has a large amount of protein showing up in the urine more so than when last checked 2 years ago.  This is is very concerning.  I will need to refer him to see nephrologist for further evaluation.  Kidney function is not 100% but stable.  Liver function tests normal.  Blood cell counts including red blood cell, white blood cell and platelet counts are normal.  LDL cholesterol is 83 with goal being less than 70.  Continue the atorvastatin, healthy eating habits and regular exercise.

## 2020-10-14 ENCOUNTER — Telehealth: Payer: Self-pay

## 2020-10-14 NOTE — Telephone Encounter (Signed)
Contacted pt to go over lab results pt is aware and doesn't have any questions or concerns 

## 2020-10-19 ENCOUNTER — Encounter: Payer: Self-pay | Admitting: Cardiovascular Disease

## 2020-10-19 NOTE — Progress Notes (Signed)
Cardiology Office Note:    Date:  10/20/2020   ID:  [name], DOB 05/14/1961, MRN [id number]  PCP:  Marcine Matar, MD  Cardiologist:  Kristeen Miss, MD  Electrophysiologist:  None   Referring MD: Marcine Matar, MD    Problem list 1.  Coronary artery disease -status post coronary artery bypass grafting 2.  Aortic stenosis-status post valve replacement 3.  Hypertension 4.  Diabetes mellitus 5.    Previous CVA 6.  Paroxysmal atrial fibrillation  Chief Complaint  Patient presents with  . Coronary Artery Disease       [name] is a 60 y.o. male with a hx of coronary artery disease and aortic valve replacement.  He apparently had a left brain TIA.  He has  on dual antiplatelet therapy.  He is currently on Plavix.Marland Kitchen  History of coronary artery bypass grafting and aortic valve replacement on November 25, 2015:  1. Median Sternotomy 2. Extracorporeal circulation 3.   Coronary artery bypass grafting x 4   Left internal mammary graft to the LAD  SVG to diagonal  SVG to Om  SVG to RCA 4.   Endoscopic vein harvest from the right leg 5.   Aortic valve replacement using a 23 mm Edwards Magna-Ease pericardial valve  His blood pressure was a little high when he saw Lawson Fiscal back in early September.  She doubled his lisinopril to 5 mg twice a day.  He is now feeling well.  Blood pressures back down to normal.  Try to work on a diet to lose weight. Is exercising   Feb. 14, 2022: [name] is seen for follow up of his CAD , CABG and AVR  BP is elevated .   Has not had his meds today ( avoiding lasix since he had doctors appt.  No CP . Exercises Has lost weight  Today 207 lbs.  Is seeing nephrology - has proteinuria  Is still eating bologna .     Past Medical History:  Diagnosis Date  . Allergy   . Anemia   . Anxiety   . Baker's cyst of knee   . Blood transfusion without reported diagnosis    as baby   . Coronary artery disease    quadruple bypass - March  2016  . GERD (gastroesophageal reflux disease)   . Gouty arthritis    "real bad" (01/17/2013)  . Heart murmur   . Hypercholesteremia   . Hypertension   . Myocardial infarction (HCC) 2017  . Stroke (HCC)   . Type II diabetes mellitus (HCC)     Past Surgical History:  Procedure Laterality Date  . AORTIC VALVE REPLACEMENT N/A 11/25/2015   Procedure: AORTIC VALVE REPLACEMENT (AVR) USING A MAGNA EASE ;  Surgeon: Alleen Borne, MD;  Location: [facility] OR;  Service: Open Heart Surgery;  Laterality: N/A;  . CARDIAC CATHETERIZATION N/A 11/19/2015   Procedure: Right/Left Heart Cath and Coronary Angiography;  Surgeon: Lyn Records, MD;  Location[facility] INVASIVE CV LAB;  Service: Cardiovascular;  Laterality: N/A;  . COLONOSCOPY  2004  . CORONARY ARTERY BYPASS GRAFT N/A 11/25/2015   Procedure: CORONARY ARTERY BYPASS GRAFTING (CABG)x4 LIMA-LAD; SVG-OM; SVG-RCA; SVG-DIAG;  Surgeon: Alleen Borne, MD;  Location: [facility] OR;  Service: Open Heart Surgery;  Laterality: N/A;  . CYST REMOVAL TRUNK N/A 08/24/2016   Procedure: EXCISION OF BACK ABSCESS;  Surgeon: Jimmye Norman, MD;  Location:  SURGERY CENTER;  Service: General;  Laterality: N/A;  . FINGER AMPUTATION Right  1980's   3rd & 4th digits "got them mashed" (01/17/2013)  . KNEE ARTHROSCOPY Right 1980's  . MULTIPLE EXTRACTIONS WITH ALVEOLOPLASTY Bilateral 11/21/2015   Procedure: Extraction of tooth #'s 2,12 with alveoloplasty and gross debridement of remaining dentition;  Surgeon: Charlynne Pander, DDS;  Location: [facility] OR;  Service: Oral Surgery;  Laterality: Bilateral;  . TEE WITHOUT CARDIOVERSION N/A 11/20/2015   Procedure: TRANSESOPHAGEAL ECHOCARDIOGRAM (TEE);  Surgeon: Laurey Morale, MD;  Location[facility] ENDOSCOPY;  Service: Cardiovascular;  Laterality: N/A;  . TEE WITHOUT CARDIOVERSION N/A 11/25/2015   Procedure: TRANSESOPHAGEAL ECHOCARDIOGRAM (TEE);  Surgeon: Alleen Borne, MD;  Location: [facility] OR;  Service: Open Heart Surgery;  Laterality: N/A;     Current Medications: Current Meds  Medication Sig  . allopurinol (ZYLOPRIM) 300 MG tablet Take 1 tablet (300 mg total) by mouth daily.  Marland Kitchen atorvastatin (LIPITOR) 80 MG tablet TAKE 1 TABLET BY MOUTH ONCE DAILY 6 IN THE EVENING  . clopidogrel (PLAVIX) 75 MG tablet Take 1 tablet (75 mg total) by mouth daily.  Burman Blacksmith ALLERGY RELIEF 10 MG tablet Take 1 tablet by mouth once daily  . ezetimibe (ZETIA) 10 MG tablet Take 1 tablet (10 mg total) by mouth daily.  . fluticasone (FLONASE) 50 MCG/ACT nasal spray Use 2 spray(s) in each nostril once daily  . furosemide (LASIX) 40 MG tablet Take 1 tablet (40 mg total) by mouth every Monday, Wednesday, and Friday.  Marland Kitchen lisinopril (ZESTRIL) 10 MG tablet 2 tabs PO Q a.m and 1 tab PO Q p.m  . metFORMIN (GLUCOPHAGE) 500 MG tablet Take 0.5 tablets (250 mg total) by mouth daily with breakfast.  . metoprolol tartrate (LOPRESSOR) 25 MG tablet Take 1 tablet (25 mg total) by mouth 2 (two) times daily.  . potassium chloride SA (KLOR-CON) 20 MEQ tablet TAKE ONE TABLET BY MOUTH EVERY MONDAY, WEDNESDAY AND FRIDAY     Allergies:   Other and Hydrocodone-acetaminophen   Social History   Socioeconomic History  . Marital status: Married    Spouse name: Not on file  . Number of children: Not on file  . Years of education: Not on file  . Highest education level: Not on file  Occupational History  . Not on file  Tobacco Use  . Smoking status: Never Smoker  . Smokeless tobacco: Never Used  Vaping Use  . Vaping Use: Never used  Substance and Sexual Activity  . Alcohol use: Yes    Comment: occasionally  . Drug use: Yes    Types: Marijuana    Comment: pt states occasionally  . Sexual activity: Yes  Other Topics Concern  . Not on file  Social History Narrative  . Not on file   Social Determinants of Health   Financial Resource Strain: Not on file  Food Insecurity: Not on file  Transportation Needs: Not on file  Physical Activity: Not on file  Stress: Not on  file  Social Connections: Not on file     Family History: The patient's family history includes Diabetes in his father; Hypertension in his brother, brother, brother, father, mother, and sister. There is no history of Colon cancer, Esophageal cancer, Rectal cancer, Stomach cancer, or Colon polyps.  ROS:      EKGs/Labs/Other Studies Reviewed:    The following studies were reviewed today:     Recent Labs: 10/09/2020: ALT 10; BUN 24; Creatinine, Ser 1.40; Hemoglobin 14.5; Platelets 274; Potassium 4.4; Sodium 142  Recent Lipid Panel    Component Value Date/Time  CHOL 175 10/09/2020 1520   TRIG 221 (H) 10/09/2020 1520   HDL 55 10/09/2020 1520   CHOLHDL 3.2 10/09/2020 1520   CHOLHDL 5.4 12/11/2015 0609   VLDL 35 12/11/2015 0609   LDLCALC 83 10/09/2020 1520    Physical Exam:    Physical Exam: Blood pressure (!) 148/106, pulse 77, height 5\' 6"  (1.676 m), weight 207 lb 3.2 oz (94 kg), SpO2 99 %.  GEN:  Well nourished, well developed in no acute distress HEENT: Normal NECK: No JVD; No carotid bruits LYMPHATICS: No lymphadenopathy CARDIAC: RRR , 2/6 systolic murmur  RESPIRATORY:  Clear to auscultation without rales, wheezing or rhonchi  ABDOMEN: Soft, non-tender, non-distended MUSCULOSKELETAL:  No edema; No deformity  SKIN: Warm and dry NEUROLOGIC:  Alert and oriented x 3   EKG:    Feb. 14 , 2022:   NSR at 77.  TWI  No changes from previous ecg   ASSESSMENT:    1. Coronary artery disease involving native coronary artery of native heart without angina pectoris   2. Aortic valve replaced   3. Benign essential HTN    PLAN:    In order of problems listed above:  1. Coronary artery disease:   No angina ,  Cont meds.   2.  Hyperlipidemia:   Trigs are mildly elevated.    Still eating bologna .  Advised him to avoid processed meats.  Recheck lipids, ALT, BMP in 6 months   3.  History of TIA:   4.  HTN:   BP is still elevated.   Eats lots of bologna.  Advised him to  avoid processed meats and other salty foods.   5.  CKD:  Is seeing nephrology soon .     Medication Adjustments/Labs and Tests Ordered: Current medicines are reviewed at length with the patient today.  Concerns regarding medicines are outlined above.  Orders Placed This Encounter  Procedures  . ALT  . Lipid panel  . Basic metabolic panel  . EKG 12-Lead   No orders of the defined types were placed in this encounter.   Patient Instructions  Medication Instructions:  Your physician recommends that you continue on your current medications as directed. Please refer to the Current Medication list given to you today.  *If you need a refill on your cardiac medications before your next appointment, please call your pharmacy*   Lab Work: Your physician recommends that you return for lab work in: 6 months on the day of or a few days before your office visit.  You will need to FAST for this appointment - nothing to eat or drink after midnight the night before except water.  If you have labs (blood work) drawn today and your tests are completely normal, you will receive your results only by: 07-17-2001 MyChart Message (if you have MyChart) OR . A paper copy in the mail If you have any lab test that is abnormal or we need to change your treatment, we will call you to review the results.   Testing/Procedures: None    Follow-Up: At [facility], you and your health needs are our priority.  As part of our continuing mission to provide you with exceptional heart care, we have created designated Provider Care Teams.  These Care Teams include your primary Cardiologist (physician) and Advanced Practice Providers (APPs -  Physician Assistants and Nurse Practitioners) who all work together to provide you with the care you need, when you need it.  We recommend signing up for  the patient portal called "MyChart".  Sign up information is provided on this After Visit Summary.  MyChart is used to connect with  patients for Virtual Visits (Telemedicine).  Patients are able to view lab/test results, encounter notes, upcoming appointments, etc.  Non-urgent messages can be sent to your provider as well.   To learn more about what you can do with MyChart, go to [email or web address].    Your next appointment:   6 month(s)  The format for your next appointment:   In Person  Provider:   You will see one of the following Advanced Practice Providers on your designated Care Team:    Tereso Newcomer, PA-C  [place], [place]        Signed, Kristeen Miss, MD  10/20/2020 10:19 AM    [facility]

## 2020-10-20 ENCOUNTER — Other Ambulatory Visit: Payer: Self-pay

## 2020-10-20 ENCOUNTER — Encounter: Payer: Self-pay | Admitting: Cardiovascular Disease

## 2020-10-20 ENCOUNTER — Ambulatory Visit: Payer: PPO | Admitting: Cardiovascular Disease

## 2020-10-20 VITALS — BP 148/106 | HR 77 | Ht 66.0 in | Wt 207.2 lb

## 2020-10-20 DIAGNOSIS — Z952 Presence of prosthetic heart valve: Secondary | ICD-10-CM

## 2020-10-20 DIAGNOSIS — I10 Essential (primary) hypertension: Secondary | ICD-10-CM

## 2020-10-20 DIAGNOSIS — I2510 Atherosclerotic heart disease of native coronary artery without angina pectoris: Secondary | ICD-10-CM | POA: Diagnosis not present

## 2020-10-20 NOTE — Patient Instructions (Addendum)
Medication Instructions:  Your physician recommends that you continue on your current medications as directed. Please refer to the Current Medication list given to you today.  *If you need a refill on your cardiac medications before your next appointment, please call your pharmacy*   Lab Work: Your physician recommends that you return for lab work in: 6 months on the day of or a few days before your office visit.  You will need to FAST for this appointment - nothing to eat or drink after midnight the night before except water.  If you have labs (blood work) drawn today and your tests are completely normal, you will receive your results only by: Marland Kitchen MyChart Message (if you have MyChart) OR . A paper copy in the mail If you have any lab test that is abnormal or we need to change your treatment, we will call you to review the results.   Testing/Procedures: None    Follow-Up: At [facility], you and your health needs are our priority.  As part of our continuing mission to provide you with exceptional heart care, we have created designated Provider Care Teams.  These Care Teams include your primary Cardiologist (physician) and Advanced Practice Providers (APPs -  Physician Assistants and Nurse Practitioners) who all work together to provide you with the care you need, when you need it.  We recommend signing up for the patient portal called "MyChart".  Sign up information is provided on this After Visit Summary.  MyChart is used to connect with patients for Virtual Visits (Telemedicine).  Patients are able to view lab/test results, encounter notes, upcoming appointments, etc.  Non-urgent messages can be sent to your provider as well.   To learn more about what you can do with MyChart, go to [email or web address].    Your next appointment:   6 month(s)  The format for your next appointment:   In Person  Provider:   You will see one of the following Advanced Practice Providers on your  designated Care Team:    Tereso Newcomer, PA-C  [place], [place]

## 2020-10-24 ENCOUNTER — Encounter: Payer: Self-pay | Admitting: Podiatry

## 2020-10-24 ENCOUNTER — Other Ambulatory Visit: Payer: Self-pay

## 2020-10-24 ENCOUNTER — Ambulatory Visit: Payer: PPO | Admitting: Podiatry

## 2020-10-24 DIAGNOSIS — B351 Tinea unguium: Secondary | ICD-10-CM | POA: Diagnosis not present

## 2020-10-24 DIAGNOSIS — E119 Type 2 diabetes mellitus without complications: Secondary | ICD-10-CM | POA: Diagnosis not present

## 2020-10-24 DIAGNOSIS — M79675 Pain in left toe(s): Secondary | ICD-10-CM | POA: Diagnosis not present

## 2020-10-24 DIAGNOSIS — M79674 Pain in right toe(s): Secondary | ICD-10-CM

## 2020-10-24 MED ORDER — ERYTHROMYCIN 2 % EX PADS: 1.0000 "application " | MEDICATED_PAD | Freq: Every day | CUTANEOUS | 0 refills | Status: DC

## 2020-10-24 NOTE — Progress Notes (Signed)
  Subjective:  Patient ID: [name], male    DOB: 07-08-1961,  MRN: [id number]  Chief Complaint  Patient presents with  . Skin Problem    Skin problem in b/w toes (1-10) x few mos; 10/10 burning -w/ itching, maceration and burning -worse with socks -no redness/swelling Tx: peroxide   . Diabetes    FBS: good A1C PCP: Johgnson x 1 wk   . debridement    BL nails trimming   60 y.o. male returns for the above complaint.  Patient presents with complaint of thickened elongated dystrophic toenails x10.  Patient would like for me to debride them down.  He denies any other acute complaints.  He has secondary complaint of maceration between the toes there is some itching associated with it.  Patient still might be some bacteria or fungal growth.  He has not treated and seek treatment options.  Denies any other acute complaints.  Objective:  There were no vitals filed for this visit. Podiatric Exam: Vascular: dorsalis pedis and posterior tibial pulses are palpable bilateral. Capillary return is immediate. Temperature gradient is WNL. Skin turgor WNL  Sensorium: Normal Semmes Weinstein monofilament test. Normal tactile sensation bilaterally. Nail Exam: Pt has thick disfigured discolored nails with subungual debris noted bilateral entire nail hallux through fifth toenails.  Pain on palpation to the nails. Ulcer Exam: There is no evidence of ulcer or pre-ulcerative changes or infection. Orthopedic Exam: Muscle tone and strength are WNL. No limitations in general ROM. No crepitus or effusions noted. HAV  B/L.  Hammer toes 2-5  B/L. Skin: No Porokeratosis. No infection or ulcers.  Mild maceration with subjective itching noted.  No ulceration noted.    Assessment & Plan:   1. Pain due to onychomycosis of toenails of both feet   2. Type 2 diabetes mellitus without complication, without long-term current use of insulin (HCC)     Patient was evaluated and treated and all questions  answered.  Interdigital maceration with itching -I discussed with the patient the importance of maceration and I discussed with him to keep it dry in between the toes.  Patient states understanding.  Also believe he will benefit from erythromycin pad as there may be a component of superinfection present with bacterial involvement.  Patient states understanding will apply daily  Onychomycosis with pain  -Nails palliatively debrided as below. -Educated on self-care  Procedure: Nail Debridement Rationale: pain  Type of Debridement: manual, sharp debridement. Instrumentation: Nail nipper, rotary burr. Number of Nails: 10  Procedures and Treatment: Consent by patient was obtained for treatment procedures. The patient understood the discussion of treatment and procedures well. All questions were answered thoroughly reviewed. Debridement of mycotic and hypertrophic toenails, 1 through 5 bilateral and clearing of subungual debris. No ulceration, no infection noted.  Return Visit-Office Procedure: Patient instructed to return to the office for a follow up visit 3 months for continued evaluation and treatment.  Nicholes Rough, DPM    No follow-ups on file.

## 2020-11-06 DIAGNOSIS — N189 Chronic kidney disease, unspecified: Secondary | ICD-10-CM | POA: Diagnosis not present

## 2020-11-06 DIAGNOSIS — D631 Anemia in chronic kidney disease: Secondary | ICD-10-CM | POA: Diagnosis not present

## 2020-11-06 DIAGNOSIS — N1831 Chronic kidney disease, stage 3a: Secondary | ICD-10-CM | POA: Diagnosis not present

## 2020-11-06 DIAGNOSIS — R809 Proteinuria, unspecified: Secondary | ICD-10-CM | POA: Diagnosis not present

## 2020-11-06 DIAGNOSIS — E785 Hyperlipidemia, unspecified: Secondary | ICD-10-CM | POA: Diagnosis not present

## 2020-11-06 DIAGNOSIS — I129 Hypertensive chronic kidney disease with stage 1 through stage 4 chronic kidney disease, or unspecified chronic kidney disease: Secondary | ICD-10-CM | POA: Diagnosis not present

## 2020-11-06 DIAGNOSIS — I2510 Atherosclerotic heart disease of native coronary artery without angina pectoris: Secondary | ICD-10-CM | POA: Diagnosis not present

## 2020-11-12 ENCOUNTER — Encounter: Payer: Self-pay | Admitting: Internal Medicine

## 2020-11-12 NOTE — Progress Notes (Signed)
I received note from patient's visit with nephrology Dr. Thedore Mins on 11/06/2020.  Patient has protein urea possibly related to FSGS or diabetic kidney disease.  Plan is to quantify his proteinuria by checking UPC.  The role of kidney biopsy was discussed but plan is to get more data first.  Lisinopril increased to 40 mg daily.

## 2020-12-07 ENCOUNTER — Other Ambulatory Visit: Payer: Self-pay | Admitting: Internal Medicine

## 2020-12-07 DIAGNOSIS — J3089 Other allergic rhinitis: Secondary | ICD-10-CM

## 2020-12-07 NOTE — Telephone Encounter (Signed)
Requested Prescriptions  Pending Prescriptions Disp Refills  . fluticasone (FLONASE) 50 MCG/ACT nasal spray [Pharmacy Med Name: Fluticasone Propionate 50 MCG/ACT Nasal Suspension] 16 g 0    Sig: Use 2 spray(s) in each nostril once daily     Ear, Nose, and Throat: Nasal Preparations - Corticosteroids Passed - 12/07/2020 12:03 PM      Passed - Valid encounter within last 12 months    Recent Outpatient Visits          1 month ago Type 2 diabetes mellitus with obesity (HCC)   [facility] [place], Gavin Pound B, MD   1 year ago Type 2 diabetes mellitus with other circulatory complication, without long-term current use of insulin (HCC)   [place] [facility] Jonah Blue B, MD   1 year ago Controlled type 2 diabetes mellitus with diabetic nephropathy, without long-term current use of insulin [facility])   [place] [facility] [place], [place], [place]   1 year ago OSA on CPAP   [facility] Marcine Matar, MD   2 years ago Essential hypertension   [facility] [place], [place], [place]

## 2021-04-12 ENCOUNTER — Other Ambulatory Visit: Payer: Self-pay | Admitting: Internal Medicine

## 2021-04-12 DIAGNOSIS — J3089 Other allergic rhinitis: Secondary | ICD-10-CM

## 2021-04-12 NOTE — Telephone Encounter (Signed)
Requested Prescriptions  Pending Prescriptions Disp Refills  . fluticasone (FLONASE) 50 MCG/ACT nasal spray [Pharmacy Med Name: Fluticasone Propionate 50 MCG/ACT Nasal Suspension] 16 g 0    Sig: Use 2 spray(s) in each nostril once daily     Ear, Nose, and Throat: Nasal Preparations - Corticosteroids Passed - 04/12/2021 12:11 PM      Passed - Valid encounter within last 12 months    Recent Outpatient Visits          6 months ago Type 2 diabetes mellitus with obesity (HCC)   [facility] [place], Gavin Pound B, MD   1 year ago Type 2 diabetes mellitus with other circulatory complication, without long-term current use of insulin (HCC)   [place] [facility] Jonah Blue B, MD   1 year ago Controlled type 2 diabetes mellitus with diabetic nephropathy, without long-term current use of insulin [facility])   [facility] [place], [place], [place]   2 years ago OSA on CPAP   [facility] Marcine Matar, [place]   2 years ago Essential hypertension   [facility] [place], [place], [place]

## 2021-05-10 ENCOUNTER — Ambulatory Visit: Payer: PPO

## 2021-05-20 ENCOUNTER — Other Ambulatory Visit: Payer: Self-pay | Admitting: Internal Medicine

## 2021-05-20 DIAGNOSIS — J3089 Other allergic rhinitis: Secondary | ICD-10-CM

## 2021-05-20 DIAGNOSIS — I25810 Atherosclerosis of coronary artery bypass graft(s) without angina pectoris: Secondary | ICD-10-CM

## 2021-05-21 ENCOUNTER — Ambulatory Visit (HOSPITAL_BASED_OUTPATIENT_CLINIC_OR_DEPARTMENT_OTHER): Payer: PPO

## 2021-05-21 DIAGNOSIS — Z5329 Procedure and treatment not carried out because of patient's decision for other reasons: Secondary | ICD-10-CM

## 2021-05-21 DIAGNOSIS — Z91199 Patient's noncompliance with other medical treatment and regimen due to unspecified reason: Secondary | ICD-10-CM

## 2021-05-21 NOTE — Patient Instructions (Signed)
Health Maintenance, Male Adopting a healthy lifestyle and getting preventive care are important in promoting health and wellness. Ask your health care provider about: The right schedule for you to have regular tests and exams. Things you can do on your own to prevent diseases and keep yourself healthy. What should I know about diet, weight, and exercise? Eat a healthy diet  Eat a diet that includes plenty of vegetables, fruits, low-fat dairy products, and lean protein. Do not eat a lot of foods that are high in solid fats, added sugars, or sodium. Maintain a healthy weight Body mass index (BMI) is a measurement that can be used to identify possible weight problems. It estimates body fat based on height and weight. Your health care provider can help determine your BMI and help you achieve or maintain a healthy weight. Get regular exercise Get regular exercise. This is one of the most important things you can do for your health. Most adults should: Exercise for at least 150 minutes each week. The exercise should increase your heart rate and make you sweat (moderate-intensity exercise). Do strengthening exercises at least twice a week. This is in addition to the moderate-intensity exercise. Spend less time sitting. Even light physical activity can be beneficial. Watch cholesterol and blood lipids Have your blood tested for lipids and cholesterol at 61 years of age, then have this test every 5 years. You may need to have your cholesterol levels checked more often if: Your lipid or cholesterol levels are high. You are older than 61 years of age. You are at high risk for heart disease. What should I know about cancer screening? Many types of cancers can be detected early and may often be prevented. Depending on your health history and family history, you may need to have cancer screening at various ages. This may include screening for: Colorectal cancer. Prostate cancer. Skin cancer. Lung  cancer. What should I know about heart disease, diabetes, and high blood pressure? Blood pressure and heart disease High blood pressure causes heart disease and increases the risk of stroke. This is more likely to develop in people who have high blood pressure readings, are of African descent, or are overweight. Talk with your health care provider about your target blood pressure readings. Have your blood pressure checked: Every 3-5 years if you are 18-39 years of age. Every year if you are 40 years old or older. If you are between the ages of 65 and 75 and are a current or former smoker, ask your health care provider if you should have a one-time screening for abdominal aortic aneurysm (AAA). Diabetes Have regular diabetes screenings. This checks your fasting blood sugar level. Have the screening done: Once every three years after age 45 if you are at a normal weight and have a low risk for diabetes. More often and at a younger age if you are overweight or have a high risk for diabetes. What should I know about preventing infection? Hepatitis B If you have a higher risk for hepatitis B, you should be screened for this virus. Talk with your health care provider to find out if you are at risk for hepatitis B infection. Hepatitis C Blood testing is recommended for: Everyone born from 1945 through 1965. Anyone with known risk factors for hepatitis C. Sexually transmitted infections (STIs) You should be screened each year for STIs, including gonorrhea and chlamydia, if: You are sexually active and are younger than 61 years of age. You are older than 61 years   of age and your health care provider tells you that you are at risk for this type of infection. Your sexual activity has changed since you were last screened, and you are at increased risk for chlamydia or gonorrhea. Ask your health care provider if you are at risk. Ask your health care provider about whether you are at high risk for HIV.  Your health care provider may recommend a prescription medicine to help prevent HIV infection. If you choose to take medicine to prevent HIV, you should first get tested for HIV. You should then be tested every 3 months for as long as you are taking the medicine. Follow these instructions at home: Lifestyle Do not use any products that contain nicotine or tobacco, such as cigarettes, e-cigarettes, and chewing tobacco. If you need help quitting, ask your health care provider. Do not use street drugs. Do not share needles. Ask your health care provider for help if you need support or information about quitting drugs. Alcohol use Do not drink alcohol if your health care provider tells you not to drink. If you drink alcohol: Limit how much you have to 0-2 drinks a day. Be aware of how much alcohol is in your drink. In the [place] one drink equals one 12 oz bottle of beer (355 mL), one 5 oz glass of wine (148 mL), or one 1 oz glass of hard liquor (44 mL). General instructions Schedule regular health, dental, and eye exams. Stay current with your vaccines. Tell your health care provider if: You often feel depressed. You have ever been abused or do not feel safe at home. Summary Adopting a healthy lifestyle and getting preventive care are important in promoting health and wellness. Follow your health care provider's instructions about healthy diet, exercising, and getting tested or screened for diseases. Follow your health care provider's instructions on monitoring your cholesterol and blood pressure. This information is not intended to replace advice given to you by your health care provider. Make sure you discuss any questions you have with your health care provider. Document Revised: 10/31/2020 Document Reviewed: 08/16/2018 Elsevier Patient Education  2022 Elsevier Inc.  

## 2021-06-25 ENCOUNTER — Ambulatory Visit: Payer: PPO | Admitting: Internal Medicine

## 2021-07-23 ENCOUNTER — Ambulatory Visit: Payer: PPO | Admitting: Internal Medicine

## 2021-08-03 ENCOUNTER — Other Ambulatory Visit: Payer: Self-pay | Admitting: Internal Medicine

## 2021-08-03 ENCOUNTER — Other Ambulatory Visit: Payer: Self-pay

## 2021-08-03 DIAGNOSIS — J3089 Other allergic rhinitis: Secondary | ICD-10-CM

## 2021-09-23 ENCOUNTER — Other Ambulatory Visit: Payer: Self-pay | Admitting: Internal Medicine

## 2021-09-23 DIAGNOSIS — I25810 Atherosclerosis of coronary artery bypass graft(s) without angina pectoris: Secondary | ICD-10-CM

## 2021-09-23 DIAGNOSIS — I480 Paroxysmal atrial fibrillation: Secondary | ICD-10-CM | POA: Diagnosis not present

## 2021-09-23 DIAGNOSIS — E119 Type 2 diabetes mellitus without complications: Secondary | ICD-10-CM | POA: Diagnosis not present

## 2021-09-23 DIAGNOSIS — J3089 Other allergic rhinitis: Secondary | ICD-10-CM

## 2021-09-23 DIAGNOSIS — Z7902 Long term (current) use of antithrombotics/antiplatelets: Secondary | ICD-10-CM | POA: Diagnosis not present

## 2021-09-23 DIAGNOSIS — Z7984 Long term (current) use of oral hypoglycemic drugs: Secondary | ICD-10-CM | POA: Diagnosis not present

## 2021-09-23 DIAGNOSIS — I10 Essential (primary) hypertension: Secondary | ICD-10-CM | POA: Diagnosis not present

## 2021-09-23 NOTE — Telephone Encounter (Signed)
Patient hasn't been seen since 10/2020 and he has canceled and no showed his appointments that were scheduled with provider

## 2021-09-23 NOTE — Telephone Encounter (Signed)
Phone call placed to patient today.  Patient informed that I received his request for refill on Flonase and Plavix.  Patient was last seen 10/2020.  I informed patient that we will give her 1 month refill but he needs to schedule a follow-up appointment otherwise we will not be refilling his medications on future request.  He has canceled or no-show a few appointments with [place].  Patient expressed understanding and states that he will keep his next appointment and if someone can call him with an appointment date and time.  Message sent to scheduling.

## 2021-10-11 ENCOUNTER — Other Ambulatory Visit: Payer: Self-pay | Admitting: Internal Medicine

## 2021-10-11 DIAGNOSIS — I10 Essential (primary) hypertension: Secondary | ICD-10-CM

## 2021-10-13 MED ORDER — POTASSIUM CHLORIDE CRYS ER 20 MEQ PO TBCR: EXTENDED_RELEASE_TABLET | ORAL | 0 refills | Status: DC

## 2021-10-13 NOTE — Addendum Note (Signed)
Addended by: Matilde Sprang on: 10/13/2021 12:35 PM   Modules accepted: Orders

## 2021-10-13 NOTE — Addendum Note (Signed)
Addended by: Lois Huxley, Jeannett Senior L on: 10/13/2021 03:02 PM   Modules accepted: Orders

## 2021-10-13 NOTE — Telephone Encounter (Signed)
Requested medication (s) are due for refill today: Yes  Requested medication (s) are on the active medication list: Yes  Last refill:  10/09/20  Future visit scheduled: Yes  Notes to clinic:  Unable to refill per protocol due to failed labs, no updated results. Patient asking for refill, upcoming appointment x 2 scheduled     Requested Prescriptions  Pending Prescriptions Disp Refills   potassium chloride SA (KLOR-CON M) 20 MEQ tablet 36 tablet 1    Sig: TAKE ONE TABLET BY MOUTH EVERY MONDAY, [facility] AND FRIDAY     Endocrinology:  Minerals - Potassium Supplementation Failed - 10/13/2021 12:35 PM      Failed - K in normal range and within 360 days    Potassium  Date Value Ref Range Status  10/09/2020 4.4 3.5 - 5.2 mmol/L Final          Failed - Cr in normal range and within 360 days    Creat  Date Value Ref Range Status  02/24/2015 1.05 0.50 - 1.35 mg/dL Final   Creatinine, Ser  Date Value Ref Range Status  10/09/2020 1.40 (H) 0.76 - 1.27 mg/dL Final   Creatinine, Urine  Date Value Ref Range Status  06/24/2016 58 20 - 370 mg/dL Final          Failed - Valid encounter within last 12 months    Recent Outpatient Visits           1 year ago Type 2 diabetes mellitus with obesity (HCC)   [place] [place], Gavin Pound B, MD   1 year ago Type 2 diabetes mellitus with other circulatory complication, without long-term current use of insulin (HCC)   [place] [facility] [place], Gavin Pound B, MD   2 years ago Controlled type 2 diabetes mellitus with diabetic nephropathy, without long-term current use of insulin [facility])   [facility] [place], [place], [place]   2 years ago OSA on CPAP   [place] [facility] [place], Gavin Pound B, MD   2 years ago Essential hypertension   [place] [address] [facility] [place], [place], [place]       Future Appointments             In 3  weeks Aldie, Marzella Schlein, PA-C [facility]   In 1 month Laural Benes, Binnie Rail, MD [facility]            Refused Prescriptions Disp Refills   potassium chloride SA (KLOR-CON M) 20 MEQ tablet [Pharmacy Med Name: Potassium Chloride Crys ER 20 MEQ Oral Tablet Extended Release] 36 tablet 0    Sig: TAKE 1 TABLET BY MOUTH ON MONDAY, [facility] AND FRIDAY     Endocrinology:  Minerals - Potassium Supplementation Failed - 10/13/2021 12:35 PM      Failed - K in normal range and within 360 days    Potassium  Date Value Ref Range Status  10/09/2020 4.4 3.5 - 5.2 mmol/L Final          Failed - Cr in normal range and within 360 days    Creat  Date Value Ref Range Status  02/24/2015 1.05 0.50 - 1.35 mg/dL Final   Creatinine, Ser  Date Value Ref Range Status  10/09/2020 1.40 (H) 0.76 - 1.27 mg/dL Final   Creatinine, Urine  Date Value Ref Range Status  06/24/2016 58 20 - 370 mg/dL Final  Failed - Valid encounter within last 12 months    Recent Outpatient Visits           1 year ago Type 2 diabetes mellitus with obesity (HCC)   [facility] [place], Gavin Pound B, MD   1 year ago Type 2 diabetes mellitus with other circulatory complication, without long-term current use of insulin (HCC)   [facility] Jonah Blue B, MD   2 years ago Controlled type 2 diabetes mellitus with diabetic nephropathy, without long-term current use of insulin [facility])   [facility] [place], [place], [place]   2 years ago OSA on CPAP   [facility] Marcine Matar, MD   2 years ago Essential hypertension   [place] [address] [facility] [place], Marzella Schlein, [place]       Future Appointments             In 3 weeks Sharon Seller, Marzella Schlein, PA-C [facility]   In 1 month Laural Benes, Binnie Rail, MD [facility]

## 2021-10-13 NOTE — Telephone Encounter (Addendum)
Medication Refill - Medication: potassium chloride SA (KLOR-CON) 20 MEQ tablet     Has the patient contacted their pharmacy? Yes.    (Agent: If yes, when and what did the pharmacy advise?) Contact PCP office and schedule appointment. Pharmacy advised patient  medication has not been filled since 03/24/2021 , patient is upset because he has been taking this medication after 03/24/2021. Patient states he can never schedule appointment with his PCP because she is booked out   Preferred Pharmacy (with phone number or street name):   [facility] - [place], [place] - [address] Phone:  [phone]  Fax:  [phone]        Has the patient been seen for an appointment in the last year OR does the patient have an upcoming appointment? Yes.    Agent: Please be advised that RX refills may take up to 3 business days. We ask that you follow-up with your pharmacy.

## 2021-11-04 ENCOUNTER — Ambulatory Visit: Payer: PPO | Attending: Physician Assistant | Admitting: Physician Assistant

## 2021-11-04 ENCOUNTER — Encounter: Payer: Self-pay | Admitting: Physician Assistant

## 2021-11-04 ENCOUNTER — Other Ambulatory Visit: Payer: Self-pay

## 2021-11-04 VITALS — BP 205/137 | HR 78 | Wt 211.6 lb

## 2021-11-04 DIAGNOSIS — I25810 Atherosclerosis of coronary artery bypass graft(s) without angina pectoris: Secondary | ICD-10-CM | POA: Diagnosis not present

## 2021-11-04 DIAGNOSIS — E669 Obesity, unspecified: Secondary | ICD-10-CM | POA: Diagnosis not present

## 2021-11-04 DIAGNOSIS — I160 Hypertensive urgency: Secondary | ICD-10-CM

## 2021-11-04 DIAGNOSIS — J3089 Other allergic rhinitis: Secondary | ICD-10-CM | POA: Diagnosis not present

## 2021-11-04 DIAGNOSIS — E1169 Type 2 diabetes mellitus with other specified complication: Secondary | ICD-10-CM

## 2021-11-04 DIAGNOSIS — E785 Hyperlipidemia, unspecified: Secondary | ICD-10-CM

## 2021-11-04 DIAGNOSIS — I10 Essential (primary) hypertension: Secondary | ICD-10-CM | POA: Diagnosis not present

## 2021-11-04 DIAGNOSIS — Z8739 Personal history of other diseases of the musculoskeletal system and connective tissue: Secondary | ICD-10-CM

## 2021-11-04 MED ORDER — METFORMIN HCL 500 MG PO TABS: 250.0000 mg | ORAL_TABLET | Freq: Every day | ORAL | 6 refills | Status: DC

## 2021-11-04 MED ORDER — ATORVASTATIN CALCIUM 80 MG PO TABS: ORAL_TABLET | ORAL | 5 refills | Status: DC

## 2021-11-04 MED ORDER — FLUTICASONE PROPIONATE 50 MCG/ACT NA SUSP: 2.0000 | Freq: Every day | NASAL | 3 refills | Status: DC

## 2021-11-04 MED ORDER — METOPROLOL TARTRATE 25 MG PO TABS: 25.0000 mg | ORAL_TABLET | Freq: Two times a day (BID) | ORAL | 1 refills | Status: DC

## 2021-11-04 MED ORDER — FUROSEMIDE 40 MG PO TABS: 40.0000 mg | ORAL_TABLET | ORAL | 5 refills | Status: DC

## 2021-11-04 MED ORDER — EZETIMIBE 10 MG PO TABS: 10.0000 mg | ORAL_TABLET | Freq: Every day | ORAL | 1 refills | Status: DC

## 2021-11-04 MED ORDER — CLOPIDOGREL BISULFATE 75 MG PO TABS: 75.0000 mg | ORAL_TABLET | Freq: Every day | ORAL | 4 refills | Status: DC

## 2021-11-04 MED ORDER — ALLOPURINOL 300 MG PO TABS: 300.0000 mg | ORAL_TABLET | Freq: Every day | ORAL | 1 refills | Status: DC

## 2021-11-04 MED ORDER — CLONIDINE HCL 0.2 MG PO TABS
0.2000 mg | ORAL_TABLET | Freq: Once | ORAL | Status: AC
  Administered 2021-11-04: 0.2 mg via ORAL

## 2021-11-04 MED ORDER — LISINOPRIL 40 MG PO TABS: 40.0000 mg | ORAL_TABLET | Freq: Every day | ORAL | 1 refills | Status: DC

## 2021-11-04 NOTE — Progress Notes (Signed)
Patient ID: [name], male   DOB: 10/20/1960, 61 y.o.   MRN: [id number] ? ? ?[name], is a 61 y.o. male ? ?[id number] ? ?[id number] ? ?DOB - April 30, 1961 ? ?Chief Complaint  ?Patient presents with  ? Medication Refill  ?    ? ?Subjective:  ? ?[name] is a 61 y.o. male here today for med RF.  Did not take meds this morning.  "I drank a beer last night."  "I was mad bc I couldn't find the new location and thought I was going to miss my appt."  Says he is compliant with meds and BP OOO are 140-160/80-90.   ? ?Patient has No headache, No chest pain, No abdominal pain - No Nausea, No new weakness tingling or numbness, No Cough - SOB. ? ?No problems updated. ? ?ALLERGIES: ?Allergies  ?Allergen Reactions  ? Other Anaphylaxis  ?  mushrooms  ? Hydrocodone-Acetaminophen Itching  ? ? ?PAST MEDICAL HISTORY: ?Past Medical History:  ?Diagnosis Date  ? Allergy   ? Anemia   ? Anxiety   ? Baker's cyst of knee   ? Blood transfusion without reported diagnosis   ? as baby   ? Coronary artery disease   ? quadruple bypass - March 2016  ? GERD (gastroesophageal reflux disease)   ? Gouty arthritis   ? "real bad" (01/17/2013)  ? Heart murmur   ? Hypercholesteremia   ? Hypertension   ? Myocardial infarction [facility]) 2017  ? Stroke [facility])   ? Type II diabetes mellitus (HCC)   ? ? ?MEDICATIONS AT HOME: ?Prior to Admission medications   ?Medication Sig Start Date End Date Taking? Authorizing Provider  ?EQ ALLERGY RELIEF 10 MG tablet Take 1 tablet by mouth once daily 03/06/19  Yes Marcine Matar, MD  ?Erythromycin 2 % PADS Apply 1 application topically daily. 10/24/20  Yes Candelaria Stagers, DPM  ?lisinopril (ZESTRIL) 40 MG tablet Take 1 tablet (40 mg total) by mouth daily. 11/04/21  Yes Georgian Co M, PA-C  ?potassium chloride SA (KLOR-CON M) 20 MEQ tablet TAKE ONE TABLET BY MOUTH EVERY MONDAY, [facility] AND FRIDAY 10/13/21  Yes Marcine Matar, MD  ?allopurinol (ZYLOPRIM) 300 MG tablet Take 1 tablet (300 mg total) by mouth daily.  11/04/21   Anders Simmonds, PA-C  ?atorvastatin (LIPITOR) 80 MG tablet TAKE ONE TABLET BY MOUTH ONCE DAILY AT 6 IN THE EVENING 11/04/21   Anders Simmonds, PA-C  ?clopidogrel (PLAVIX) 75 MG tablet Take 1 tablet (75 mg total) by mouth daily. 11/04/21   Anders Simmonds, PA-C  ?ezetimibe (ZETIA) 10 MG tablet Take 1 tablet (10 mg total) by mouth daily. 11/04/21   Anders Simmonds, PA-C  ?fluticasone (FLONASE) 50 MCG/ACT nasal spray Place 2 sprays into both nostrils daily. 11/04/21   Anders Simmonds, PA-C  ?furosemide (LASIX) 40 MG tablet Take 1 tablet (40 mg total) by mouth every Monday, Wednesday, and Friday. 11/04/21   Anders Simmonds, PA-C  ?metFORMIN (GLUCOPHAGE) 500 MG tablet Take 0.5 tablets (250 mg total) by mouth daily with breakfast. 11/04/21   Anders Simmonds, PA-C  ?metoprolol tartrate (LOPRESSOR) 25 MG tablet Take 1 tablet (25 mg total) by mouth 2 (two) times daily. 11/04/21   Anders Simmonds, PA-C  ? ? ?ROS: ?Neg HEENT ?Neg GI ?Neg GU ?Neg MS ?Neg psych ? ?Objective:  ? ?Vitals:  ? 11/04/21 0934 11/04/21 0935  ?BP: (!) 213/127 (!) 195/124  ?Pulse: 78 78  ?SpO2: 98% 98%  ?  Weight: 211 lb 9.6 oz (96 kg)   ? ?Exam ?General appearance : Awake, alert, not in any distress. Speech Clear. Not toxic looking ?HEENT: Atraumatic and Normocephalic ?Neck: Supple, no JVD. No cervical lymphadenopathy.  ?Chest: Good air entry bilaterally, CTAB.  No rales/rhonchi/wheezing ?CVS: S1 S2 regular, no murmurs.  ?Extremities: B/L Lower Ext shows no edema, both legs are warm to touch ?Neurology: Awake alert, and oriented X 3, CN II-XII intact, Non focal ?Skin: No Rash ? ?Data Review ?Lab Results  ?Component Value Date  ? HGBA1C 5.4 10/09/2020  ? HGBA1C 5.8 (A) 08/29/2019  ? HGBA1C 6.2 (H) 05/11/2018  ? ? ?Assessment & Plan  ? ?1. Type 2 diabetes mellitus with obesity (HCC) ?Will asses control  with A1C and adjust as needed ?I have had a lengthy discussion and provided education about insulin resistance and the intake of too much  sugar/refined carbohydrates.  I have advised the patient to work at a goal of eliminating sugary drinks, candy, desserts, sweets, refined sugars, processed foods, and white carbohydrates.  The patient expresses understanding.  ?- Comprehensive metabolic panel ?- CBC with Differential/Platelet ?- Hemoglobin A1c ?- metFORMIN (GLUCOPHAGE) 500 MG tablet; Take 0.5 tablets (250 mg total) by mouth daily with breakfast.  Dispense: 30 tablet; Refill: 6 ? ?2. Essential hypertension ?Uncontrolled/not at goal/did not take meds today.  Check BP OOO several times/week and record.  I question compliance.  Avoid alcohol ?- Comprehensive metabolic panel ?- CBC with Differential/Platelet ?- lisinopril (ZESTRIL) 40 MG tablet; Take 1 tablet (40 mg total) by mouth daily.  Dispense: 90 tablet; Refill: 1 ?- metoprolol tartrate (LOPRESSOR) 25 MG tablet; Take 1 tablet (25 mg total) by mouth 2 (two) times daily.  Dispense: 180 tablet; Refill: 1 ?- furosemide (LASIX) 40 MG tablet; Take 1 tablet (40 mg total) by mouth every Monday, Wednesday, and Friday.  Dispense: 30 tablet; Refill: 5 ? ?3. Hyperlipidemia associated with type 2 diabetes mellitus (HCC) ?- Comprehensive metabolic panel ?- Lipid panel ?See #5 ? ?4. Hypertensive urgency ?- cloNIDine (CATAPRES) tablet 0.2 mg ?Did not bring BP down.  If he develops HA/CP/dizziness; call 911.  He can go ahead and take meds for today in the next 2-3 hours.  Patient verbalizes understanding ? ?5. Coronary artery disease involving coronary bypass graft of native heart without angina pectoris ?- ezetimibe (ZETIA) 10 MG tablet; Take 1 tablet (10 mg total) by mouth daily.  Dispense: 90 tablet; Refill: 1 ?- clopidogrel (PLAVIX) 75 MG tablet; Take 1 tablet (75 mg total) by mouth daily.  Dispense: 30 tablet; Refill: 4 ?- atorvastatin (LIPITOR) 80 MG tablet; TAKE ONE TABLET BY MOUTH ONCE DAILY AT 6 IN THE EVENING  Dispense: 30 tablet; Refill: 5 ? ?6. History of gout ?- allopurinol (ZYLOPRIM) 300 MG tablet;  Take 1 tablet (300 mg total) by mouth daily.  Dispense: 90 tablet; Refill: 1 ? ?7. Seasonal allergic rhinitis due to other allergic trigger ?- fluticasone (FLONASE) 50 MCG/ACT nasal spray; Place 2 sprays into both nostrils daily.  Dispense: 16 g; Refill: 3 ? ? ? ?Patient have been counseled extensively about nutrition and exercise. Other issues discussed during this visit include: low cholesterol diet, weight control and daily exercise, foot care, annual eye examinations at Ophthalmology, importance of adherence with medications and regular follow-up. We also discussed long term complications of uncontrolled diabetes and hypertension.  ? ?Return for 3 weeks with Franky Macho;  3 months with PCP. ? ?The patient was given clear instructions to go to ER or  return to medical center if symptoms don't improve, worsen or new problems develop. The patient verbalized understanding. The patient was told to call to get lab results if they haven't heard anything in the next week.  ? ? ? ? ?Georgian Co, PA-C ?[facility] ?[place], [place] ?[phone]   ?11/04/2021, 9:50 AM  ?

## 2021-11-04 NOTE — Patient Instructions (Signed)
Check blood pressure 3-4 times per week and record.  Bring numbers to next visit ? ?

## 2021-11-05 LAB — COMPREHENSIVE METABOLIC PANEL
ALT: 11 IU/L (ref 0–44)
AST: 40 IU/L (ref 0–40)
Albumin/Globulin Ratio: 1.3 (ref 1.2–2.2)
Albumin: 3.9 g/dL (ref 3.8–4.8)
Alkaline Phosphatase: 135 IU/L — ABNORMAL HIGH (ref 44–121)
BUN/Creatinine Ratio: 16 (ref 10–24)
BUN: 33 mg/dL — ABNORMAL HIGH (ref 8–27)
Bilirubin Total: <0.2 mg/dL (ref 0.0–1.2)
CO2: 17 mmol/L — ABNORMAL LOW (ref 20–29)
Calcium: 8.9 mg/dL (ref 8.6–10.2)
Chloride: 109 mmol/L — ABNORMAL HIGH (ref 96–106)
Creatinine, Ser: 2.03 mg/dL — ABNORMAL HIGH (ref 0.76–1.27)
Globulin, Total: 2.9 g/dL (ref 1.5–4.5)
Glucose: 134 mg/dL — ABNORMAL HIGH (ref 70–99)
Potassium: 4.5 mmol/L (ref 3.5–5.2)
Sodium: 140 mmol/L (ref 134–144)
Total Protein: 6.8 g/dL (ref 6.0–8.5)
eGFR: 36 mL/min/{1.73_m2} — ABNORMAL LOW (ref >59)

## 2021-11-05 LAB — CBC WITH DIFFERENTIAL/PLATELET
Basophils Absolute: 0.1 10*3/uL (ref 0.0–0.2)
Basos: 1 %
EOS (ABSOLUTE): 0.2 10*3/uL (ref 0.0–0.4)
Eos: 3 %
Hematocrit: 39.7 % (ref 37.5–51.0)
Hemoglobin: 13.1 g/dL (ref 13.0–17.7)
Immature Grans (Abs): 0.1 10*3/uL (ref 0.0–0.1)
Immature Granulocytes: 1 %
Lymphocytes Absolute: 1.9 10*3/uL (ref 0.7–3.1)
Lymphs: 31 %
MCH: 29.9 pg (ref 26.6–33.0)
MCHC: 33 g/dL (ref 31.5–35.7)
MCV: 91 fL (ref 79–97)
Monocytes Absolute: 0.7 10*3/uL (ref 0.1–0.9)
Monocytes: 11 %
Neutrophils Absolute: 3.2 10*3/uL (ref 1.4–7.0)
Neutrophils: 53 %
Platelets: 265 10*3/uL (ref 150–450)
RBC: 4.38 x10E6/uL (ref 4.14–5.80)
RDW: 12.4 % (ref 11.6–15.4)
WBC: 6 10*3/uL (ref 3.4–10.8)

## 2021-11-05 LAB — LIPID PANEL
Chol/HDL Ratio: 2.9 ratio (ref 0.0–5.0)
Cholesterol, Total: 171 mg/dL (ref 100–199)
HDL: 60 mg/dL (ref >39)
LDL Chol Calc (NIH): 90 mg/dL (ref 0–99)
Triglycerides: 117 mg/dL (ref 0–149)
VLDL Cholesterol Cal: 21 mg/dL (ref 5–40)

## 2021-11-05 LAB — HEMOGLOBIN A1C
Est. average glucose Bld gHb Est-mCnc: 120 mg/dL
Hgb A1c MFr Bld: 5.8 % — ABNORMAL HIGH (ref 4.8–5.6)

## 2021-11-23 ENCOUNTER — Other Ambulatory Visit: Payer: Self-pay | Admitting: Internal Medicine

## 2021-11-23 DIAGNOSIS — I10 Essential (primary) hypertension: Secondary | ICD-10-CM

## 2021-11-24 ENCOUNTER — Telehealth: Payer: Self-pay | Admitting: Pharmacist

## 2021-11-24 NOTE — Patient Outreach (Signed)
Patient appearing on report for [place] Metric Hypertension Control due to last documented ambulatory blood pressure of 205/137 on 11/04/2021. Next appointment with PCP is 03/12/22, scheduled to see PharmD on 12/03/21.   ? ?Outreached patient to discuss hypertension control and medication management, ensure home BP checks prior to PharmD visit. Left voicemail for him to return my call at his convenience.  ? ?Catie Darnelle Maffucci, PharmD, BCACP ?[place] ?[phone] ? ? ?

## 2021-11-25 NOTE — Telephone Encounter (Signed)
Requested Prescriptions  ?Pending Prescriptions Disp Refills  ?? potassium chloride SA (KLOR-CON M) 20 MEQ tablet [Pharmacy Med Name: Potassium Chloride Crys ER 20 MEQ Oral Tablet Extended Release] 12 tablet 2  ?  Sig: TAKE 1 TABLET BY MOUTH ON MONDAY, WEDNESDAY AND FRIDAY . APPOINTMENT REQUIRED FOR FUTURE REFILLS  ?  ? Endocrinology:  Minerals - Potassium Supplementation Failed - 11/23/2021 11:34 AM  ?  ?  Failed - Cr in normal range and within 360 days  ?  Creat  ?Date Value Ref Range Status  ?02/24/2015 1.05 0.50 - 1.35 mg/dL Final  ? ?Creatinine, Ser  ?Date Value Ref Range Status  ?11/04/2021 2.03 (H) 0.76 - 1.27 mg/dL Final  ? ?Creatinine, Urine  ?Date Value Ref Range Status  ?06/24/2016 58 20 - 370 mg/dL Final  ?   ?  ?  Passed - K in normal range and within 360 days  ?  Potassium  ?Date Value Ref Range Status  ?11/04/2021 4.5 3.5 - 5.2 mmol/L Final  ?   ?  ?  Passed - Valid encounter within last 12 months  ?  Recent Outpatient Visits   ?      ? 3 weeks ago Type 2 diabetes mellitus with obesity (HCC)  ? [facility] [place], [place], [place]  ? 1 year ago Type 2 diabetes mellitus with obesity (HCC)  ? [facility] Marcine Matar, MD  ? 1 year ago Type 2 diabetes mellitus with other circulatory complication, without long-term current use of insulin (HCC)  ? [facility] Marcine Matar, MD  ? 2 years ago Controlled type 2 diabetes mellitus with diabetic nephropathy, without long-term current use of insulin (HCC)  ? [facility] [place], [place], [place]  ? 2 years ago OSA on CPAP  ? [facility] Marcine Matar, MD  ?  ?  ?Future Appointments   ?        ? In 1 week Lois Huxley, Cornelius Moras, RPH-CPP [facility]  ? In 3 months Marcine Matar, MD [facility]  ?  ? ?  ?  ?  ?elevated Cr addressed by  provider. ?

## 2021-12-03 ENCOUNTER — Ambulatory Visit: Payer: PPO | Attending: Internal Medicine | Admitting: Pharmacist

## 2021-12-03 ENCOUNTER — Encounter: Payer: Self-pay | Admitting: Pharmacist

## 2021-12-03 VITALS — BP 187/117 | HR 62

## 2021-12-03 DIAGNOSIS — I10 Essential (primary) hypertension: Secondary | ICD-10-CM | POA: Diagnosis not present

## 2021-12-03 MED ORDER — AMLODIPINE BESYLATE 5 MG PO TABS: 5.0000 mg | ORAL_TABLET | Freq: Every day | ORAL | 0 refills | Status: DC

## 2021-12-03 NOTE — Progress Notes (Signed)
? ?  S:    ?PCP: Georgian Co  ? ?No chief complaint on file. ? ?[name] is a 61 y.o. male who presents for hypertension evaluation, education, and management. PMH is significant for CAD, AVR, HTN, stroke, TIA, T2DM, gout, dyslipidemia. Patient was referred and last seen by Primary Care Provider, Georgian Co, on 11/04/2021. ? ?Today, he arrives in good spirits and presents without assistance. Denies dizziness, headache, blurred vision, swelling.  ? ?Patient reports hypertension was longstanding. He went a long time without care before he was last seen by [name]. ? ?Family/Social history:  ?-Fhx: DM, HTN ?-Tobacco: never smoker  ?- Alcohol: denies use sine last visit ? ?Medication adherence reported . Patient has taken BP medications today.  ? ?Current antihypertensives include:  lisinopril 40 mg daily, metoprolol tartrate 25 mg BID ? ?Reported home BP readings:  ?-Checked the other 143/80 ? ?Patient reported dietary habits:  ?-Limits sodium ?- Denies excessive caffeine intake  ? 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 ?O:  ?Vitals:  ? 12/03/21 0900  ?BP: (!) 187/117  ?Pulse: 62  ? ?Last 3 Office BP readings: ?BP Readings from Last 3 Encounters:  ?12/03/21 (!) 187/117  ?11/04/21 (!) 205/137  ?10/20/20 (!) 148/106  ? ?BMET ?   ?Component Value Date/Time  ? NA 140 11/04/2021 1028  ? K 4.5 11/04/2021 1028  ? CL 109 (H) 11/04/2021 1028  ? CO2 17 (L) 11/04/2021 1028  ? GLUCOSE 134 (H) 11/04/2021 1028  ? GLUCOSE 133 (H) 04/12/2018 0932  ? BUN 33 (H) 11/04/2021 1028  ? CREATININE 2.03 (H) 11/04/2021 1028  ? CREATININE 1.05 02/24/2015 1601  ? CALCIUM 8.9 11/04/2021 1028  ? GFRNONAA 54 (L) 10/09/2020 1520  ? GFRNONAA 80 02/24/2015 1601  ? GFRAA 62 10/09/2020 1520  ? GFRAA >89 02/24/2015 1601  ? ? ?Renal function: ?CrCl cannot be calculated (Patient's most recent lab result is older than the maximum 21 days allowed.). ? ?Clinical ASCVD: Yes  ?The ASCVD Risk score (Arnett DK, et al., 2019)  failed to calculate for the following reasons: ?  The patient has a prior MI or stroke diagnosis ? ?A/P: ?Hypertension longstanding currently uncontrolled on current medications. BP goal < 130/80 mmHg. Medication adherence appears improved.  ?-Started amlodipine 5 mg daily. ?-Continue lisinopril 40 mg daily, metoprolol tartrate 25 mg BID  ?-Patient educated on purpose, proper use, and potential adverse effects of amlodipine.  ?-F/u labs ordered - BMP ?-Counseled on lifestyle modifications for blood pressure control including reduced dietary sodium, increased exercise, adequate sleep. ?-Encouraged patient to check BP at home and bring log of readings to next visit. Counseled on proper use of home BP cuff.  ? ?Results reviewed and written information provided. Patient verbalized understanding of treatment plan. Total time in face-to-face counseling 30 minutes.  ? ?F/u clinic visit in 4 weeks.  ? ?Butch Penny, PharmD, BCACP, CPP ?Clinical Pharmacist ?[facility] ?[phone] ? ?

## 2021-12-04 ENCOUNTER — Other Ambulatory Visit: Payer: Self-pay | Admitting: Internal Medicine

## 2021-12-04 DIAGNOSIS — E1121 Type 2 diabetes mellitus with diabetic nephropathy: Secondary | ICD-10-CM

## 2021-12-04 LAB — BASIC METABOLIC PANEL
BUN/Creatinine Ratio: 16 (ref 10–24)
BUN: 28 mg/dL — ABNORMAL HIGH (ref 8–27)
CO2: 18 mmol/L — ABNORMAL LOW (ref 20–29)
Calcium: 9.2 mg/dL (ref 8.6–10.2)
Chloride: 109 mmol/L — ABNORMAL HIGH (ref 96–106)
Creatinine, Ser: 1.71 mg/dL — ABNORMAL HIGH (ref 0.76–1.27)
Glucose: 113 mg/dL — ABNORMAL HIGH (ref 70–99)
Potassium: 4.7 mmol/L (ref 3.5–5.2)
Sodium: 140 mmol/L (ref 134–144)
eGFR: 45 mL/min/{1.73_m2} — ABNORMAL LOW (ref >59)

## 2021-12-04 NOTE — Progress Notes (Signed)
Let patient know that his kidney function is not 100% but improved slightly from earlier this month.  We need to get him back in with his kidney specialist Dr. Thedore Mins.  I have submitted that referral.

## 2021-12-08 ENCOUNTER — Ambulatory Visit: Payer: PPO | Admitting: Internal Medicine

## 2021-12-22 ENCOUNTER — Telehealth: Payer: Self-pay | Admitting: Pharmacist

## 2021-12-22 NOTE — Telephone Encounter (Signed)
Patient appearing on report for [employer] - Hypertension Control report due to last documented ambulatory blood pressure of 187/117 on 12/03/21. Next appointment with PCP is 7/723, he is scheduled to follow up with the embedded pharmacist on 01/01/22  ? ?Outreached patient to discuss hypertension control and medication management.  ? ?Current antihypertensives: 40 mg daily, metoprolol tartrate 25 mg daily, amlodipine 5 mg daily, furosemide 40 mg three days weekly  ? ?Patient has an automated upper arm home BP machine, but he is unsure the brand. He has had this blood pressure machine since his open heart surgery in 2017 ? ?Current blood pressure readings: reports systolics in 140-150s ? ? ?Patient denies hypotensive signs and symptoms including dizziness, lightheadedness.  ?Patient denies hypertensive symptoms including headache, chest pain, shortness of breath. ? ?Patient denies side effects related to amlodipine - denies any lower extremity edema  ? ?Assessment/Plan: ?- Currently uncontrolled but improving  ?- Reviewed goal blood pressure <130/80 ?- Reviewed appropriate administration of medication regimen ?- Encouraged to bring home BP cuff to his upcoming pharmacist visit for accuracy comparison ?- Counseled that amlodipine dose may be increased at next visit ?- Reviewed to check blood pressure daily, document, and provide at next provider visit ? ?Catie Eppie Gibson, PharmD, BCACP ?[facility] ?[phone] ? ? ?

## 2022-01-01 ENCOUNTER — Ambulatory Visit: Payer: PPO | Attending: Internal Medicine | Admitting: Pharmacist

## 2022-01-01 ENCOUNTER — Encounter: Payer: Self-pay | Admitting: Pharmacist

## 2022-01-01 VITALS — BP 167/108 | HR 68

## 2022-01-01 DIAGNOSIS — I10 Essential (primary) hypertension: Secondary | ICD-10-CM

## 2022-01-01 MED ORDER — AMLODIPINE BESYLATE 10 MG PO TABS: 10.0000 mg | ORAL_TABLET | Freq: Every day | ORAL | 1 refills | Status: DC

## 2022-01-01 NOTE — Progress Notes (Signed)
? ?  S:    ?PCP: Georgian Co  ? ?No chief complaint on file. ? ?[name] is a 61 y.o. male who presents for hypertension evaluation, education, and management. PMH is significant for CAD, AVR, HTN, stroke, TIA, T2DM, gout, dyslipidemia. Patient was referred and last seen by Primary Care Provider, Georgian Co, on 11/04/2021. I saw him on 12/03/2021 and added amlodipine.  ? ?Today, he arrives in good spirits and presents without assistance. Denies dizziness, headache, blurred vision, swelling.  ? ?Patient reports hypertension is longstanding.  ? ?Family/Social history:  ?-Fhx: DM, HTN ?-Tobacco: never smoker  ?- Alcohol: denies use sine last visit ? ?Medication adherence reported . Patient has taken BP medications today.  ? ?Current antihypertensives include:  lisinopril 40 mg daily, metoprolol tartrate 25 mg BID, amlodipine 5 mg daily  ? ?Reported home BP readings:  ?-This morning: 151/72, 80 on home meter  ? ?Patient reported dietary habits:  ?-Limits sodium ?- Denies excessive caffeine intake  ? ?Patient-reported exercise habits:  ?-Active at work  ?-Details cars  ? ?O:  ?Vitals:  ? 01/01/22 1004  ?BP: (!) 167/108  ?Pulse: 68  ? ? ?Last 3 Office BP readings: ?BP Readings from Last 3 Encounters:  ?01/01/22 (!) 167/108  ?12/03/21 (!) 187/117  ?11/04/21 (!) 205/137  ? ?BMET ?   ?Component Value Date/Time  ? NA 140 12/03/2021 0910  ? K 4.7 12/03/2021 0910  ? CL 109 (H) 12/03/2021 0910  ? CO2 18 (L) 12/03/2021 0910  ? GLUCOSE 113 (H) 12/03/2021 0910  ? GLUCOSE 133 (H) 04/12/2018 0932  ? BUN 28 (H) 12/03/2021 0910  ? CREATININE 1.71 (H) 12/03/2021 0910  ? CREATININE 1.05 02/24/2015 1601  ? CALCIUM 9.2 12/03/2021 0910  ? GFRNONAA 54 (L) 10/09/2020 1520  ? GFRNONAA 80 02/24/2015 1601  ? GFRAA 62 10/09/2020 1520  ? GFRAA >89 02/24/2015 1601  ? ? ?Renal function: ?CrCl cannot be calculated (Patient's most recent lab result is older than the maximum 21 days allowed.). ? ?Clinical ASCVD: Yes  ?The ASCVD Risk score  (Arnett DK, et al., 2019) failed to calculate for the following reasons: ?  The patient has a prior MI or stroke diagnosis ? ?A/P: ?Hypertension longstanding currently uncontrolled on current medications. BP goal < 130/80 mmHg. Medication adherence appears improved.  ?-Increase amlodipine to 10 mg daily. ?-Continue lisinopril 40 mg daily, metoprolol tartrate 25 mg BID  ?-Patient educated on purpose, proper use, and potential adverse effects of amlodipine.  ?-F/u labs ordered - none ?-Counseled on lifestyle modifications for blood pressure control including reduced dietary sodium, increased exercise, adequate sleep. ?-Encouraged patient to check BP at home and bring log of readings to next visit. Counseled on proper use of home BP cuff.  ? ?Results reviewed and written information provided. Patient verbalized understanding of treatment plan. Total time in face-to-face counseling 30 minutes.  ? ?F/u clinic visit in 1 month.  ? ?Butch Penny, PharmD, BCACP, CPP ?Clinical Pharmacist ?[facility] ?[phone] ? ?

## 2022-01-28 DIAGNOSIS — I129 Hypertensive chronic kidney disease with stage 1 through stage 4 chronic kidney disease, or unspecified chronic kidney disease: Secondary | ICD-10-CM | POA: Diagnosis not present

## 2022-01-28 DIAGNOSIS — N183 Chronic kidney disease, stage 3 unspecified: Secondary | ICD-10-CM | POA: Diagnosis not present

## 2022-01-28 DIAGNOSIS — D631 Anemia in chronic kidney disease: Secondary | ICD-10-CM | POA: Diagnosis not present

## 2022-01-28 DIAGNOSIS — R809 Proteinuria, unspecified: Secondary | ICD-10-CM | POA: Diagnosis not present

## 2022-01-28 DIAGNOSIS — N2581 Secondary hyperparathyroidism of renal origin: Secondary | ICD-10-CM | POA: Diagnosis not present

## 2022-02-04 ENCOUNTER — Ambulatory Visit: Payer: PPO | Attending: Internal Medicine | Admitting: Pharmacist

## 2022-02-04 ENCOUNTER — Encounter: Payer: Self-pay | Admitting: Pharmacist

## 2022-02-04 VITALS — BP 118/81 | HR 64

## 2022-02-04 DIAGNOSIS — I10 Essential (primary) hypertension: Secondary | ICD-10-CM | POA: Diagnosis not present

## 2022-02-04 NOTE — Progress Notes (Signed)
   S:    PCP: Freeman Caldron   No chief complaint on file.  [name] is a 61 y.o. male who presents for hypertension evaluation, education, and management. PMH is significant for CAD, AVR, HTN, stroke, TIA, T2DM, gout, dyslipidemia. Patient was referred and last seen by Primary Care Provider, Freeman Caldron, on 11/04/2021. I saw him on 01/01/2022 and increased amlodipine.   Today, he arrives in good spirits and presents without assistance. Denies chest pain, dizziness, headache, blurred vision, swelling.   Patient reports hypertension is longstanding.   Family/Social history:  -Fhx: DM, HTN -Tobacco: never smoker  -Alcohol: denies use sine last visit  Medication adherence reported. Patient has taken BP medications today.   Current antihypertensives include:  lisinopril 40 mg daily, metoprolol tartrate 25 mg BID, amlodipine 10 mg daily   Reported home BP readings:  -None given  Patient reported dietary habits:  -Limits sodium -Denies excessive caffeine intake   Patient-reported exercise habits:  -Active at work  -Details cars  -Walks ~30-40 minutes daily   O:  Vitals:   02/04/22 0843  BP: 118/81  Pulse: 64    Last 3 Office BP readings: BP Readings from Last 3 Encounters:  02/04/22 118/81  01/01/22 (!) 167/108  12/03/21 (!) 187/117   BMET    Component Value Date/Time   NA 140 12/03/2021 0910   K 4.7 12/03/2021 0910   CL 109 (H) 12/03/2021 0910   CO2 18 (L) 12/03/2021 0910   GLUCOSE 113 (H) 12/03/2021 0910   GLUCOSE 133 (H) 04/12/2018 0932   BUN 28 (H) 12/03/2021 0910   CREATININE 1.71 (H) 12/03/2021 0910   CREATININE 1.05 02/24/2015 1601   CALCIUM 9.2 12/03/2021 0910   GFRNONAA 54 (L) 10/09/2020 1520   GFRNONAA 80 02/24/2015 1601   GFRAA 62 10/09/2020 1520   GFRAA >89 02/24/2015 1601    Renal function: CrCl cannot be calculated (Patient's most recent lab result is older than the maximum 21 days allowed.).  Clinical ASCVD: Yes  The ASCVD Risk score  (Arnett DK, et al., 2019) failed to calculate for the following reasons:   The patient has a prior MI or stroke diagnosis  A/P: Hypertension longstanding currently at goal on current medications. BP goal < 130/80 mmHg. Medication adherence appears optimal.  -Continue current antihypertensive regimen.  -F/u labs ordered - none -Counseled on lifestyle modifications for blood pressure control including reduced dietary sodium, increased exercise, adequate sleep. -Encouraged patient to check BP at home and bring log of readings to next visit. Counseled on proper use of home BP cuff.   Results reviewed and written information provided. Patient verbalized understanding of treatment plan. Total time in face-to-face counseling 30 minutes.   F/u clinic visit in July with PCP.   Benard Halsted, PharmD, [facility] [place], [place] [phone]

## 2022-03-12 ENCOUNTER — Encounter: Payer: Self-pay | Admitting: Internal Medicine

## 2022-03-12 ENCOUNTER — Ambulatory Visit: Payer: PPO | Attending: Internal Medicine | Admitting: Internal Medicine

## 2022-03-12 VITALS — BP 138/84 | HR 68 | Temp 98.2°F | Ht 66.0 in | Wt 217.6 lb

## 2022-03-12 DIAGNOSIS — G4733 Obstructive sleep apnea (adult) (pediatric): Secondary | ICD-10-CM | POA: Diagnosis not present

## 2022-03-12 DIAGNOSIS — E1159 Type 2 diabetes mellitus with other circulatory complications: Secondary | ICD-10-CM | POA: Diagnosis not present

## 2022-03-12 DIAGNOSIS — I25810 Atherosclerosis of coronary artery bypass graft(s) without angina pectoris: Secondary | ICD-10-CM | POA: Diagnosis not present

## 2022-03-12 DIAGNOSIS — E1121 Type 2 diabetes mellitus with diabetic nephropathy: Secondary | ICD-10-CM | POA: Diagnosis not present

## 2022-03-12 DIAGNOSIS — L309 Dermatitis, unspecified: Secondary | ICD-10-CM

## 2022-03-12 DIAGNOSIS — Z1211 Encounter for screening for malignant neoplasm of colon: Secondary | ICD-10-CM

## 2022-03-12 DIAGNOSIS — I152 Hypertension secondary to endocrine disorders: Secondary | ICD-10-CM | POA: Diagnosis not present

## 2022-03-12 DIAGNOSIS — L602 Onychogryphosis: Secondary | ICD-10-CM | POA: Diagnosis not present

## 2022-03-12 MED ORDER — TRIAMCINOLONE ACETONIDE 0.1 % EX CREA: 1.0000 | TOPICAL_CREAM | Freq: Two times a day (BID) | CUTANEOUS | 0 refills | Status: DC

## 2022-03-12 MED ORDER — METOPROLOL TARTRATE 25 MG PO TABS: 25.0000 mg | ORAL_TABLET | Freq: Two times a day (BID) | ORAL | 1 refills | Status: DC

## 2022-03-12 MED ORDER — POTASSIUM CHLORIDE CRYS ER 20 MEQ PO TBCR: EXTENDED_RELEASE_TABLET | ORAL | 2 refills | Status: DC

## 2022-03-12 NOTE — Progress Notes (Signed)
Patient ID: [name], male    DOB: 05-27-1961  MRN: [id number]  CC: Hypertension   Subjective: [name] is a 61 y.o. male who presents for chronic disease management. His concerns today include:  DM, macro-albuminuria, HTN, HL, CAD s/p CABG x4, and bicuspid AV s/p AVR 2017, anemia, TIA, atherosclerosis of intracranial arteries, OSA.    HTN/CAD: Since last visit with me, he has been seeing our clinical pharmacist for follow-up on blood pressure.  BP was good when he was last checked.  Reports compliance with Norvasc 10 mg, lisinopril 40 mg daily and metoprolol 25 mg twice a day.  Also compliant with atorvastatin, Zetia and Plavix. No chest pain, shortness of breath, lower extremity edema.  CKD: Reports seeing his nephrologist recently and was started on Farxiga.  Patient with history of macroalbumin.  Dr. Thedore Mins felt this was due to FSGS or DM kidney  Complains of itching with rash of his forearms x2 weeks.  No initiating factors.  No one else in his house with rash.  No recent change in body products or washing detergent.  DM: Lab Results  Component Value Date   HGBA1C 5.8 (H) 11/04/2021  He is taking and tolerating metformin 250 mg daily.  Weight is up 6 pounds since last visit with [place] in March.  Reports that he stays active.  He walks daily and washes cars.  Drinks water and sweet tea.  OSA: When I saw him last year, I referred him for mask desensitization.  Reports he did go and got a new mask but he still cannot tolerate the machine.  Denies any daytime sleepiness.  Wakes up in the mornings feeling refreshed.  No morning headaches.   Patient Active Problem List   Diagnosis Date Noted   Influenza vaccine refused 10/09/2020   OSA on CPAP 02/06/2019   Macroalbuminuric diabetic nephropathy (HCC) 02/06/2019   TIA (transient ischemic attack) 04/10/2018   Colon cancer screening 05/04/2017   CVA (cerebral infarction) 12/11/2015   Acute ischemic VBA thalamic stroke (HCC)     Atherosclerosis of native coronary artery of native heart without angina pectoris    H/O heart bypass surgery    Benign essential HTN    Type 2 diabetes mellitus without complication, without long-term current use of insulin (HCC)    S/P AVR 11/25/2015   CAD (coronary artery disease)    Chronic periodontitis 11/21/2015   Bicuspid aortic valve    Controlled type 2 diabetes mellitus with diabetic nephropathy, without long-term current use of insulin (HCC)    Essential hypertension 02/24/2015   Dyslipidemia 02/24/2015   GERD (gastroesophageal reflux disease) 05/02/2013   Primary gout 05/02/2013   Allergic rhinitis due to allergen 07/09/2010     Current Outpatient Medications on File Prior to Visit  Medication Sig Dispense Refill   allopurinol (ZYLOPRIM) 300 MG tablet Take 1 tablet (300 mg total) by mouth daily. 90 tablet 1   amLODipine (NORVASC) 10 MG tablet Take 1 tablet (10 mg total) by mouth daily. 90 tablet 1   atorvastatin (LIPITOR) 80 MG tablet TAKE ONE TABLET BY MOUTH ONCE DAILY AT 6 IN THE EVENING 30 tablet 5   clopidogrel (PLAVIX) 75 MG tablet Take 1 tablet (75 mg total) by mouth daily. 30 tablet 4   EQ ALLERGY RELIEF 10 MG tablet Take 1 tablet by mouth once daily 30 tablet 2   Erythromycin 2 % PADS Apply 1 application topically daily. 60 each 0   ezetimibe (ZETIA) 10 MG tablet  Take 1 tablet (10 mg total) by mouth daily. 90 tablet 1   fluticasone (FLONASE) 50 MCG/ACT nasal spray Place 2 sprays into both nostrils daily. 16 g 3   furosemide (LASIX) 40 MG tablet Take 1 tablet (40 mg total) by mouth every Monday, Wednesday, and Friday. 30 tablet 5   lisinopril (ZESTRIL) 40 MG tablet Take 1 tablet (40 mg total) by mouth daily. 90 tablet 1   metFORMIN (GLUCOPHAGE) 500 MG tablet Take 0.5 tablets (250 mg total) by mouth daily with breakfast. 30 tablet 6   metoprolol tartrate (LOPRESSOR) 25 MG tablet Take 1 tablet (25 mg total) by mouth 2 (two) times daily. 180 tablet 1   potassium  chloride SA (KLOR-CON M) 20 MEQ tablet TAKE 1 TABLET BY MOUTH ON MONDAY, WEDNESDAY AND FRIDAY . APPOINTMENT REQUIRED FOR FUTURE REFILLS 12 tablet 2   No current facility-administered medications on file prior to visit.    Allergies  Allergen Reactions   Other Anaphylaxis    mushrooms   Hydrocodone-Acetaminophen Itching    Social History   Socioeconomic History   Marital status: Married    Spouse name: Not on file   Number of children: Not on file   Years of education: Not on file   Highest education level: Not on file  Occupational History   Not on file  Tobacco Use   Smoking status: Never   Smokeless tobacco: Never  Vaping Use   Vaping Use: Never used  Substance and Sexual Activity   Alcohol use: Yes    Comment: occasionally   Drug use: Yes    Types: Marijuana    Comment: pt states occasionally   Sexual activity: Yes  Other Topics Concern   Not on file  Social History Narrative   Not on file   Social Determinants of Health   Financial Resource Strain: Not on file  Food Insecurity: Not on file  Transportation Needs: Not on file  Physical Activity: Not on file  Stress: Not on file  Social Connections: Not on file  Intimate Partner Violence: Not on file    Family History  Problem Relation Age of Onset   Diabetes Father    Hypertension Father    Hypertension Mother    Hypertension Sister    Hypertension Brother    Hypertension Brother    Hypertension Brother    Colon cancer Neg Hx    Esophageal cancer Neg Hx    Rectal cancer Neg Hx    Stomach cancer Neg Hx    Colon polyps Neg Hx     Past Surgical History:  Procedure Laterality Date   AORTIC VALVE REPLACEMENT N/A 11/25/2015   Procedure: AORTIC VALVE REPLACEMENT (AVR) USING A 23MM MAGNA EASE ;  Surgeon: Gaye Pollack, MD;  Location: [facility] OR;  Service: Open Heart Surgery;  Laterality: N/A;   CARDIAC CATHETERIZATION N/A 11/19/2015   Procedure: Right/Left Heart Cath and Coronary Angiography;  Surgeon: Belva Crome, MD;  Location: [place] [facility];  Service: Cardiovascular;  Laterality: N/A;   COLONOSCOPY  2004   CORONARY ARTERY BYPASS GRAFT N/A 11/25/2015   Procedure: CORONARY ARTERY BYPASS GRAFTING (CABG)x4 LIMA-LAD; SVG-OM; SVG-RCA; SVG-DIAG;  Surgeon: Gaye Pollack, MD;  Location: [place];  Service: Open Heart Surgery;  Laterality: N/A;   CYST REMOVAL TRUNK N/A 08/24/2016   Procedure: EXCISION OF BACK ABSCESS;  Surgeon: Judeth Horn, MD;  Location: [place];  Service: General;  Laterality: N/A;   FINGER AMPUTATION Right 1980's  3rd & 4th digits "got them mashed" (01/17/2013)   KNEE ARTHROSCOPY Right 1980's   MULTIPLE EXTRACTIONS WITH ALVEOLOPLASTY Bilateral 11/21/2015   Procedure: Extraction of tooth #'s 2,12 with alveoloplasty and gross debridement of remaining dentition;  Surgeon: Lenn Cal, DDS;  Location: [place];  Service: Oral Surgery;  Laterality: Bilateral;   TEE WITHOUT CARDIOVERSION N/A 11/20/2015   Procedure: TRANSESOPHAGEAL ECHOCARDIOGRAM (TEE);  Surgeon: Larey Dresser, MD;  Location: [place];  Service: Cardiovascular;  Laterality: N/A;   TEE WITHOUT CARDIOVERSION N/A 11/25/2015   Procedure: TRANSESOPHAGEAL ECHOCARDIOGRAM (TEE);  Surgeon: Gaye Pollack, MD;  Location: [place];  Service: Open Heart Surgery;  Laterality: N/A;    ROS: Review of Systems Negative except as stated above  PHYSICAL EXAM: BP 138/84   Pulse 68   Temp 98.2 F (36.8 C) (Oral)   Ht $R'5\' 6"'Zq$  (1.676 m)   Wt 217 lb 9.6 oz (98.7 kg)   SpO2 99%   BMI 35.12 kg/m   Wt Readings from Last 3 Encounters:  03/12/22 217 lb 9.6 oz (98.7 kg)  11/04/21 211 lb 9.6 oz (96 kg)  10/20/20 207 lb 3.2 oz (94 kg)  Repeat blood pressure 142/80  Physical Exam General appearance - alert, well appearing, and in no distress Mental status - normal mood, behavior, speech, dress, motor activity, and thought processes Neck - supple, no significant adenopathy Chest - clear to auscultation, no wheezes,  rales or rhonchi, symmetric air entry Heart -2/6 SEM LSB.  Regular rate rhythm. Trace LE edema Skin: Excoriation and dryness of the forearms especially in the cubital fossa. Diabetic Foot Exam - Simple   Simple Foot Form Diabetic Foot exam was performed with the following findings: Yes 03/12/2022 10:18 AM  Visual Inspection See comments: Yes Sensation Testing Intact to touch and monofilament testing bilaterally: Yes Pulse Check Posterior Tibialis and Dorsalis pulse intact bilaterally: Yes Comments Toenails are discolored and somewhat overgrown.  Hypopigmentation between the toes.  Hyperpigmentation some areas soles of the feet.        Latest Ref Rng & Units 12/03/2021    9:10 AM 11/04/2021   10:28 AM 10/09/2020    3:20 PM  CMP  Glucose 70 - 99 mg/dL 113  134  100   BUN 8 - 27 mg/dL 28  33  24   Creatinine 0.76 - 1.27 mg/dL 1.71  2.03  1.40   Sodium 134 - 144 mmol/L 140  140  142   Potassium 3.5 - 5.2 mmol/L 4.7  4.5  4.4   Chloride 96 - 106 mmol/L 109  109  110   CO2 20 - 29 mmol/L $RemoveB'18  17  19   'DDykxPgF$ Calcium 8.6 - 10.2 mg/dL 9.2  8.9  9.0   Total Protein 6.0 - 8.5 g/dL  6.8  6.7   Total Bilirubin 0.0 - 1.2 mg/dL  <0.2  0.3   Alkaline Phos 44 - 121 IU/L  135  127   AST 0 - 40 IU/L  40  26   ALT 0 - 44 IU/L  11  10    Lipid Panel     Component Value Date/Time   CHOL 171 11/04/2021 1028   TRIG 117 11/04/2021 1028   HDL 60 11/04/2021 1028   CHOLHDL 2.9 11/04/2021 1028   CHOLHDL 5.4 12/11/2015 0609   VLDL 35 12/11/2015 0609   LDLCALC 90 11/04/2021 1028    CBC    Component Value Date/Time   WBC 6.0 11/04/2021 1028  WBC 8.7 04/10/2018 1648   RBC 4.38 11/04/2021 1028   RBC 4.41 04/10/2018 1648   HGB 13.1 11/04/2021 1028   HCT 39.7 11/04/2021 1028   PLT 265 11/04/2021 1028   MCV 91 11/04/2021 1028   MCH 29.9 11/04/2021 1028   MCH 30.6 04/10/2018 1648   MCHC 33.0 11/04/2021 1028   MCHC 33.9 04/10/2018 1648   RDW 12.4 11/04/2021 1028   LYMPHSABS 1.9 11/04/2021 1028    MONOABS 0.9 04/10/2018 1648   EOSABS 0.2 11/04/2021 1028   BASOSABS 0.1 11/04/2021 1028    ASSESSMENT AND PLAN: 1. Diabetes mellitus with macroalbuminuric diabetic nephropathy ([place]) Continue low-dose metformin and Farxiga. Dietary counseling given.  Advised to eliminate sugary drinks from his diet including sweet tea. Continue regular exercise. - Ambulatory referral to Ophthalmology - Microalbumin / creatinine urine ratio  2. Hypertension associated with diabetes ([place]) Not at goal today.  However blood pressure was good on last visit with the clinical pharmacist.  He will continue his current medications including amlodipine 10 mg daily, lisinopril 40 mg daily and metoprolol 25 mg twice a day and furosemide 3 days a week.  We will have him follow-up with the clinical pharmacist again in several weeks for recheck. - potassium chloride SA (KLOR-CON M) 20 MEQ tablet; TAKE 1 TABLET BY MOUTH ON MONDAY, WEDNESDAY AND FRIDAY . APPOINTMENT REQUIRED FOR FUTURE REFILLS  Dispense: 12 tablet; Refill: 2 - metoprolol tartrate (LOPRESSOR) 25 MG tablet; Take 1 tablet (25 mg total) by mouth 2 (two) times daily.  Dispense: 180 tablet; Refill: 1  3. Coronary artery disease involving coronary bypass graft of native heart without angina pectoris Stable.  Continue metoprolol, Zetia, Lipitor 80 mg daily and Plavix.  4. OSA (obstructive sleep apnea) Patient not willing to use CPAP because he cannot tolerate the mask.  Other option would be to refer him to ENT to be considered for inspire but patient declines at this time.  5. Dermatitis - triamcinolone cream (KENALOG) 0.1 %; Apply 1 Application topically 2 (two) times daily.  Dispense: 30 g; Refill: 0  6. Overgrown toenails - Ambulatory referral to Podiatry  7. Screening for colon cancer Discussed need for colon cancer screening.  Cologuard was ordered over a year ago.  Patient states that his neighbor had received it and held it for several months before  giving it to him.  I told him that we will order a new kit and he is willing to perform the test. - Cologuard   Patient was given the opportunity to ask questions.  Patient verbalized understanding of the plan and was able to repeat key elements of the plan.   This documentation was completed using Radio producer.  Any transcriptional errors are unintentional.  No orders of the defined types were placed in this encounter.    Requested Prescriptions    No prescriptions requested or ordered in this encounter    No follow-ups on file.  Karle Plumber, MD, FACP

## 2022-03-12 NOTE — Patient Instructions (Signed)
Please check your blood pressure at least twice a week and record the readings.  Bring your readings with you when you come to see the clinical pharmacist in 6 weeks for recheck of blood pressure.

## 2022-03-13 LAB — MICROALBUMIN / CREATININE URINE RATIO
Creatinine, Urine: 40 mg/dL
Microalb/Creat Ratio: 1336 mg/g creat — ABNORMAL HIGH (ref 0–29)
Microalbumin, Urine: 534.3 ug/mL

## 2022-03-24 DIAGNOSIS — I480 Paroxysmal atrial fibrillation: Secondary | ICD-10-CM | POA: Diagnosis not present

## 2022-03-24 DIAGNOSIS — Z7902 Long term (current) use of antithrombotics/antiplatelets: Secondary | ICD-10-CM | POA: Diagnosis not present

## 2022-03-24 DIAGNOSIS — Z951 Presence of aortocoronary bypass graft: Secondary | ICD-10-CM | POA: Diagnosis not present

## 2022-03-24 DIAGNOSIS — Z89021 Acquired absence of right finger(s): Secondary | ICD-10-CM | POA: Diagnosis not present

## 2022-03-24 DIAGNOSIS — I2510 Atherosclerotic heart disease of native coronary artery without angina pectoris: Secondary | ICD-10-CM | POA: Diagnosis not present

## 2022-03-24 DIAGNOSIS — I10 Essential (primary) hypertension: Secondary | ICD-10-CM | POA: Diagnosis not present

## 2022-04-12 DIAGNOSIS — Z1211 Encounter for screening for malignant neoplasm of colon: Secondary | ICD-10-CM | POA: Diagnosis not present

## 2022-04-16 ENCOUNTER — Ambulatory Visit: Payer: PPO | Admitting: Podiatry

## 2022-04-16 DIAGNOSIS — B999 Unspecified infectious disease: Secondary | ICD-10-CM | POA: Diagnosis not present

## 2022-04-16 DIAGNOSIS — B351 Tinea unguium: Secondary | ICD-10-CM

## 2022-04-16 MED ORDER — ERYTHROMYCIN 2 % EX PADS: 60.0000 | MEDICATED_PAD | CUTANEOUS | 2 refills | Status: AC

## 2022-04-16 MED ORDER — CICLOPIROX 8 % EX SOLN: Freq: Every day | CUTANEOUS | 0 refills | Status: DC

## 2022-04-21 LAB — COLOGUARD: COLOGUARD: NEGATIVE

## 2022-04-21 NOTE — Progress Notes (Signed)
Let pt know her cologuard was NEG no cancer recheck three years

## 2022-04-22 ENCOUNTER — Telehealth: Payer: Self-pay

## 2022-04-22 NOTE — Telephone Encounter (Signed)
No further notes needed from me

## 2022-04-23 ENCOUNTER — Ambulatory Visit: Payer: PPO | Attending: Internal Medicine | Admitting: Pharmacist

## 2022-04-23 VITALS — BP 128/86 | HR 62

## 2022-04-23 DIAGNOSIS — E1159 Type 2 diabetes mellitus with other circulatory complications: Secondary | ICD-10-CM | POA: Diagnosis not present

## 2022-04-23 DIAGNOSIS — I152 Hypertension secondary to endocrine disorders: Secondary | ICD-10-CM

## 2022-04-23 NOTE — Progress Notes (Signed)
   S:    PCP: Dr. Laural Benes   No chief complaint on file.  [name] is a 61 y.o. male who presents for hypertension evaluation, education, and management. PMH is significant for CAD, AVR, HTN, stroke, TIA, T2DM, gout, dyslipidemia. Patient was referred and last seen by Primary Care Provider, Dr. Laural Benes, on 03/12/2022. BP was a little above goal but acceptable, especially considering his previous degree of BP elevation.   Today, he arrives in good spirits and presents without assistance. Denies chest pain, dizziness, headache, blurred vision, swelling.   Patient reports hypertension is longstanding.   Family/Social history:  -Fhx: DM, HTN -Tobacco: never smoker  -Alcohol: denies use sine last visit  Medication adherence reported. Patient has taken BP medications today.   Current antihypertensives include:  lisinopril 40 mg daily, metoprolol tartrate 25 mg BID, amlodipine 10 mg daily   Reported home BP readings:  -None given  Patient reported dietary habits:  -Limits sodium -Denies excessive caffeine intake   Patient-reported exercise habits:  -Active at work  -Details cars  -Walks ~30-40 minutes daily   O:  Vitals:   04/23/22 0850  BP: 128/86  Pulse: 62   Last 3 Office BP readings: BP Readings from Last 3 Encounters:  04/23/22 128/86  03/12/22 138/84  02/04/22 118/81   BMET    Component Value Date/Time   NA 140 12/03/2021 0910   K 4.7 12/03/2021 0910   CL 109 (H) 12/03/2021 0910   CO2 18 (L) 12/03/2021 0910   GLUCOSE 113 (H) 12/03/2021 0910   GLUCOSE 133 (H) 04/12/2018 0932   BUN 28 (H) 12/03/2021 0910   CREATININE 1.71 (H) 12/03/2021 0910   CREATININE 1.05 02/24/2015 1601   CALCIUM 9.2 12/03/2021 0910   GFRNONAA 54 (L) 10/09/2020 1520   GFRNONAA 80 02/24/2015 1601   GFRAA 62 10/09/2020 1520   GFRAA >89 02/24/2015 1601    Renal function: CrCl cannot be calculated (Patient's most recent lab result is older than the maximum 21 days  allowed.).  Clinical ASCVD: Yes  The ASCVD Risk score (Arnett DK, et al., 2019) failed to calculate for the following reasons:   The patient has a prior MI or stroke diagnosis  A/P: Hypertension longstanding currently close to goal on current medications. BP goal < 130/80 mmHg. Medication adherence appears optimal. DBP is >80 mmhg. Will continue current regimen for now. Would love to add a low dose thiazide in the setting of previous stroke, however, he has a history of gout. Of note, his UA was last checked in 2017 and was well below 7. His heart rate keeps [place] from increasing his metoprolol dose today. In the future, we could consider changing metoprolol to carvedilol if we need better BP lowering.  -Continue current antihypertensive regimen.  -F/u labs ordered - none -Counseled on lifestyle modifications for blood pressure control including reduced dietary sodium, increased exercise, adequate sleep. -Encouraged patient to check BP at home and bring log of readings to next visit. Counseled on proper use of home BP cuff.   Results reviewed and written information provided. Patient verbalized understanding of treatment plan. Total time in face-to-face counseling 30 minutes.   F/u clinic visit in December with PCP.   Butch Penny, PharmD, P[place]y [place], CPP Clinical Pharmacist [facility] [phone]

## 2022-04-23 NOTE — Progress Notes (Signed)
Subjective:  Patient ID: [name], male    DOB: 1961-03-27,  MRN: [id number]  Chief Complaint  Patient presents with   Tinea Pedis    61 y.o. male presents with the above complaint.  Patient presents with complaint of bilateral interdigital space fungus.  Patient states his painful to touch there is some maceration present.  He states that he has been doing some Betadine in between.  There are some itching associated with it.  He has not tried anything else for it.  Denies any other acute complaints he would like to discuss treatment options for it.   Review of Systems: Negative except as noted in the HPI. Denies N/V/F/Ch.  Past Medical History:  Diagnosis Date   Allergy    Anemia    Anxiety    Baker's cyst of knee    Blood transfusion without reported diagnosis    as baby    Coronary artery disease    quadruple bypass - March 2016   GERD (gastroesophageal reflux disease)    Gouty arthritis    "real bad" (01/17/2013)   Heart murmur    Hypercholesteremia    Hypertension    Myocardial infarction (HCC) 2017   Stroke (HCC)    Type II diabetes mellitus (HCC)     Current Outpatient Medications:    ciclopirox (PENLAC) 8 % solution, Apply topically at bedtime. Apply over nail and surrounding skin. Apply daily over previous coat. After seven (7) days, may remove with alcohol and continue cycle., Disp: 6.6 mL, Rfl: 0   allopurinol (ZYLOPRIM) 300 MG tablet, Take 1 tablet (300 mg total) by mouth daily., Disp: 90 tablet, Rfl: 1   amLODipine (NORVASC) 10 MG tablet, Take 1 tablet (10 mg total) by mouth daily., Disp: 90 tablet, Rfl: 1   atorvastatin (LIPITOR) 80 MG tablet, TAKE ONE TABLET BY MOUTH ONCE DAILY AT 6 IN THE EVENING, Disp: 30 tablet, Rfl: 5   clopidogrel (PLAVIX) 75 MG tablet, Take 1 tablet (75 mg total) by mouth daily., Disp: 30 tablet, Rfl: 4   EQ ALLERGY RELIEF 10 MG tablet, Take 1 tablet by mouth once daily, Disp: 30 tablet, Rfl: 2   Erythromycin 2 % PADS, Apply 1  application topically daily., Disp: 60 each, Rfl: 0   ezetimibe (ZETIA) 10 MG tablet, Take 1 tablet (10 mg total) by mouth daily., Disp: 90 tablet, Rfl: 1   FARXIGA 10 MG TABS tablet, Take 10 mg by mouth daily., Disp: , Rfl:    fluticasone (FLONASE) 50 MCG/ACT nasal spray, Place 2 sprays into both nostrils daily., Disp: 16 g, Rfl: 3   furosemide (LASIX) 40 MG tablet, Take 1 tablet (40 mg total) by mouth every Monday, Wednesday, and Friday., Disp: 30 tablet, Rfl: 5   lisinopril (ZESTRIL) 40 MG tablet, Take 1 tablet (40 mg total) by mouth daily., Disp: 90 tablet, Rfl: 1   metFORMIN (GLUCOPHAGE) 500 MG tablet, Take 0.5 tablets (250 mg total) by mouth daily with breakfast., Disp: 30 tablet, Rfl: 6   metoprolol tartrate (LOPRESSOR) 25 MG tablet, Take 1 tablet (25 mg total) by mouth 2 (two) times daily., Disp: 180 tablet, Rfl: 1   potassium chloride SA (KLOR-CON M) 20 MEQ tablet, TAKE 1 TABLET BY MOUTH ON MONDAY, WEDNESDAY AND FRIDAY . APPOINTMENT REQUIRED FOR FUTURE REFILLS, Disp: 12 tablet, Rfl: 2   triamcinolone cream (KENALOG) 0.1 %, Apply 1 Application topically 2 (two) times daily., Disp: 30 g, Rfl: 0  Social History   Tobacco Use  Smoking Status  Never  Smokeless Tobacco Never    Allergies  Allergen Reactions   Other Anaphylaxis    mushrooms   Hydrocodone-Acetaminophen Itching   Objective:  There were no vitals filed for this visit. There is no height or weight on file to calculate BMI. Constitutional Well developed. Well nourished.  Vascular Dorsalis pedis pulses palpable bilaterally. Posterior tibial pulses palpable bilaterally. Capillary refill normal to all digits.  No cyanosis or clubbing noted. Pedal hair growth normal.  Neurologic Normal speech. Oriented to person, place, and time. Epicritic sensation to light touch grossly present bilaterally.  Dermatologic In digital maceration present in between the toes consistent with fungus.  Orthopedic: Normal joint ROM without  pain or crepitus bilaterally. No visible deformities. No bony tenderness.   Radiographs: None Assessment:   1. Superinfection   2. Onychomycosis    Plan:  Patient was evaluated and treated and all questions answered.  Interdigital maceration/superinfection bilateral -All of the concerns were discussed in extensive detail.  I will hold off on oral medication for now.  I discussed Betadine wet-to-dry versus erythromycin pads.  We will try erythromycin pads at this time.   -Erythromycin pads were dispensed.  If there is no improvement we will do Betadine wet-to-dry dressing No follow-ups on file.

## 2022-05-11 ENCOUNTER — Telehealth: Payer: Self-pay

## 2022-05-11 NOTE — Telephone Encounter (Signed)
Encounter created in error

## 2022-05-12 NOTE — Telephone Encounter (Signed)
yes

## 2022-05-13 NOTE — Telephone Encounter (Signed)
Can you updated it and clear it out in cover my meds please

## 2022-05-14 DIAGNOSIS — N1831 Chronic kidney disease, stage 3a: Secondary | ICD-10-CM | POA: Diagnosis not present

## 2022-05-31 DIAGNOSIS — I129 Hypertensive chronic kidney disease with stage 1 through stage 4 chronic kidney disease, or unspecified chronic kidney disease: Secondary | ICD-10-CM | POA: Diagnosis not present

## 2022-05-31 DIAGNOSIS — E785 Hyperlipidemia, unspecified: Secondary | ICD-10-CM | POA: Diagnosis not present

## 2022-05-31 DIAGNOSIS — N2581 Secondary hyperparathyroidism of renal origin: Secondary | ICD-10-CM | POA: Diagnosis not present

## 2022-05-31 DIAGNOSIS — D631 Anemia in chronic kidney disease: Secondary | ICD-10-CM | POA: Diagnosis not present

## 2022-05-31 DIAGNOSIS — R809 Proteinuria, unspecified: Secondary | ICD-10-CM | POA: Diagnosis not present

## 2022-05-31 DIAGNOSIS — N183 Chronic kidney disease, stage 3 unspecified: Secondary | ICD-10-CM | POA: Diagnosis not present

## 2022-06-08 ENCOUNTER — Other Ambulatory Visit: Payer: Self-pay | Admitting: Physician Assistant

## 2022-06-08 DIAGNOSIS — I25810 Atherosclerosis of coronary artery bypass graft(s) without angina pectoris: Secondary | ICD-10-CM

## 2022-06-08 NOTE — Telephone Encounter (Signed)
Requested medication (s) are due for refill today: yes  Requested medication (s) are on the active medication list: yes  Last refill:  11/04/21 #30 4 refills  Future visit scheduled: yes in 2 months  Notes to clinic:  no refills remain. Do you want to refill Rx #30 1 refill?     Requested Prescriptions  Pending Prescriptions Disp Refills   clopidogrel (PLAVIX) 75 MG tablet [Pharmacy Med Name: Clopidogrel Bisulfate 75 MG Oral Tablet] 30 tablet 0    Sig: Take 1 tablet by mouth once daily     Hematology: Antiplatelets - clopidogrel Failed - 06/08/2022  9:58 AM      Failed - HCT in normal range and within 180 days    Hematocrit  Date Value Ref Range Status  11/04/2021 39.7 37.5 - 51.0 % Final         Failed - HGB in normal range and within 180 days    Hemoglobin  Date Value Ref Range Status  11/04/2021 13.1 13.0 - 17.7 g/dL Final         Failed - PLT in normal range and within 180 days    Platelets  Date Value Ref Range Status  11/04/2021 265 150 - 450 x10E3/uL Final         Failed - Cr in normal range and within 360 days    Creat  Date Value Ref Range Status  02/24/2015 1.05 0.50 - 1.35 mg/dL Final   Creatinine, Ser  Date Value Ref Range Status  12/03/2021 1.71 (H) 0.76 - 1.27 mg/dL Final   Creatinine, Urine  Date Value Ref Range Status  06/24/2016 58 20 - 370 mg/dL Final         Passed - Valid encounter within last 6 months    Recent Outpatient Visits           1 month ago Hypertension associated with diabetes [facility])   [place], Jarome Matin, RPH-CPP   2 months ago Diabetes mellitus with macroalbuminuric diabetic nephropathy [facility])   [place], Deborah B, MD   4 months ago Essential hypertension   [place], Jarome Matin, RPH-CPP   5 months ago Essential hypertension   [place], Stephen L, RPH-CPP    6 months ago Essential hypertension   [place], Jarome Matin, RPH-CPP       Future Appointments             In 2 months Wynetta Emery, Dalbert Batman, MD [place]

## 2022-06-09 ENCOUNTER — Encounter: Payer: Self-pay | Admitting: Internal Medicine

## 2022-06-09 ENCOUNTER — Other Ambulatory Visit: Payer: Self-pay | Admitting: Internal Medicine

## 2022-06-09 ENCOUNTER — Other Ambulatory Visit: Payer: Self-pay | Admitting: Physician Assistant

## 2022-06-09 DIAGNOSIS — I152 Hypertension secondary to endocrine disorders: Secondary | ICD-10-CM

## 2022-06-09 DIAGNOSIS — I25810 Atherosclerosis of coronary artery bypass graft(s) without angina pectoris: Secondary | ICD-10-CM

## 2022-06-09 NOTE — Telephone Encounter (Signed)
Requested medication (s) are due for refill today: yes  Requested medication (s) are on the active medication list: yes  Last refill:  03/12/22 #12/2  Future visit scheduled: yes 08/12/22  Notes to clinic:  pt meets protocol but rx said pt needs appt. Has appt scheduled for 08/12/22     Requested Prescriptions  Pending Prescriptions Disp Refills   potassium chloride SA (KLOR-CON M) 20 MEQ tablet [Pharmacy Med Name: Potassium Chloride Crys ER 20 MEQ Oral Tablet Extended Release] 12 tablet 0    Sig: TAKE 1 TABLET BY MOUTH ON MONDAY, [place] . APPOINTMENT REQUIRED FOR FUTURE REFILLS     Endocrinology:  Minerals - Potassium Supplementation Failed - 06/09/2022  9:54 AM      Failed - Cr in normal range and within 360 days    Creat  Date Value Ref Range Status  02/24/2015 1.05 0.50 - 1.35 mg/dL Final   Creatinine, Ser  Date Value Ref Range Status  12/03/2021 1.71 (H) 0.76 - 1.27 mg/dL Final   Creatinine, Urine  Date Value Ref Range Status  06/24/2016 58 20 - 370 mg/dL Final         Passed - K in normal range and within 360 days    Potassium  Date Value Ref Range Status  12/03/2021 4.7 3.5 - 5.2 mmol/L Final         Passed - Valid encounter within last 12 months    Recent Outpatient Visits           1 month ago Hypertension associated with diabetes [facility])   [place], Jarome Matin, RPH-CPP   2 months ago Diabetes mellitus with macroalbuminuric diabetic nephropathy [facility])   [place], MD   4 months ago Essential hypertension   [place], Jarome Matin, RPH-CPP   5 months ago Essential hypertension   [place], Stephen L, RPH-CPP   6 months ago Essential hypertension   [place], RPH-CPP       Future Appointments             In 2  months Wynetta Emery, Dalbert Batman, MD [place]

## 2022-06-09 NOTE — Telephone Encounter (Signed)
Requested Prescriptions  Pending Prescriptions Disp Refills  . atorvastatin (LIPITOR) 80 MG tablet [Pharmacy Med Name: Atorvastatin Calcium 80 MG Oral Tablet] 90 tablet 1    Sig: TAKE 1 TABLET BY MOUTH ONCE DAILY AT 6 IN THE EVENING     Cardiovascular:  Antilipid - Statins Failed - 06/09/2022  9:54 AM      Failed - Lipid Panel in normal range within the last 12 months    Cholesterol, Total  Date Value Ref Range Status  11/04/2021 171 100 - 199 mg/dL Final   LDL Chol Calc (NIH)  Date Value Ref Range Status  11/04/2021 90 0 - 99 mg/dL Final   HDL  Date Value Ref Range Status  11/04/2021 60 >39 mg/dL Final   Triglycerides  Date Value Ref Range Status  11/04/2021 117 0 - 149 mg/dL Final         Passed - Patient is not pregnant      Passed - Valid encounter within last 12 months    Recent Outpatient Visits          1 month ago Hypertension associated with diabetes [facility])   [place], Annie Main L, RPH-CPP   2 months ago Diabetes mellitus with macroalbuminuric diabetic nephropathy [facility])   [place], Deborah B, MD   4 months ago Essential hypertension   [place], Jarome Matin, RPH-CPP   5 months ago Essential hypertension   [place], Stephen L, RPH-CPP   6 months ago Essential hypertension   [place], RPH-CPP      Future Appointments            In 2 months Wynetta Emery, Dalbert Batman, MD [place]

## 2022-06-09 NOTE — Progress Notes (Signed)
Note received from nephrologist Dr. Candiss Norse.  Patient seen 05/31/2022. Diagnosed with CKD stage III etiology likely related to DM, HTN and renal vascular disease.  Patient on maximum dose of lisinopril and Farxiga. Protein urea: Worsening albuminuria over the years possibly related to FSGS/diabetic kidney disease.  On ACE and SGLT2i Anemia of renal disease.  Globin today 11.9. Labs: Albumin/creatinine ratio of 1639 Hemoglobin of 11.9 GFR of 40 Creatinine 1.88

## 2022-07-16 ENCOUNTER — Other Ambulatory Visit: Payer: Self-pay | Admitting: Physician Assistant

## 2022-07-16 DIAGNOSIS — I25810 Atherosclerosis of coronary artery bypass graft(s) without angina pectoris: Secondary | ICD-10-CM

## 2022-07-22 ENCOUNTER — Other Ambulatory Visit: Payer: Self-pay | Admitting: Physician Assistant

## 2022-07-22 DIAGNOSIS — I10 Essential (primary) hypertension: Secondary | ICD-10-CM

## 2022-08-12 ENCOUNTER — Encounter: Payer: Self-pay | Admitting: Internal Medicine

## 2022-08-12 ENCOUNTER — Ambulatory Visit: Payer: PPO | Attending: Internal Medicine | Admitting: Internal Medicine

## 2022-08-12 VITALS — BP 138/84 | HR 68 | Temp 98.4°F | Ht 66.0 in | Wt 214.0 lb

## 2022-08-12 DIAGNOSIS — E1122 Type 2 diabetes mellitus with diabetic chronic kidney disease: Secondary | ICD-10-CM | POA: Diagnosis not present

## 2022-08-12 DIAGNOSIS — E1121 Type 2 diabetes mellitus with diabetic nephropathy: Secondary | ICD-10-CM

## 2022-08-12 DIAGNOSIS — D649 Anemia, unspecified: Secondary | ICD-10-CM

## 2022-08-12 DIAGNOSIS — I25810 Atherosclerosis of coronary artery bypass graft(s) without angina pectoris: Secondary | ICD-10-CM | POA: Diagnosis not present

## 2022-08-12 DIAGNOSIS — N183 Chronic kidney disease, stage 3 unspecified: Secondary | ICD-10-CM | POA: Diagnosis not present

## 2022-08-12 DIAGNOSIS — E1169 Type 2 diabetes mellitus with other specified complication: Secondary | ICD-10-CM | POA: Diagnosis not present

## 2022-08-12 DIAGNOSIS — E669 Obesity, unspecified: Secondary | ICD-10-CM | POA: Diagnosis not present

## 2022-08-12 DIAGNOSIS — Z23 Encounter for immunization: Secondary | ICD-10-CM | POA: Diagnosis not present

## 2022-08-12 DIAGNOSIS — I129 Hypertensive chronic kidney disease with stage 1 through stage 4 chronic kidney disease, or unspecified chronic kidney disease: Secondary | ICD-10-CM

## 2022-08-12 LAB — POCT GLYCOSYLATED HEMOGLOBIN (HGB A1C): HbA1c, POC (controlled diabetic range): 5.8 % (ref 0.0–7.0)

## 2022-08-12 LAB — GLUCOSE, POCT (MANUAL RESULT ENTRY): POC Glucose: 132 mg/dl — AB (ref 70–99)

## 2022-08-12 MED ORDER — LISINOPRIL 40 MG PO TABS: 40.0000 mg | ORAL_TABLET | Freq: Every day | ORAL | 1 refills | Status: DC

## 2022-08-12 MED ORDER — AMLODIPINE BESYLATE 10 MG PO TABS: 10.0000 mg | ORAL_TABLET | Freq: Every day | ORAL | 1 refills | Status: DC

## 2022-08-12 MED ORDER — CARVEDILOL 12.5 MG PO TABS: 12.5000 mg | ORAL_TABLET | Freq: Two times a day (BID) | ORAL | 6 refills | Status: DC

## 2022-08-12 MED ORDER — POTASSIUM CHLORIDE CRYS ER 20 MEQ PO TBCR: EXTENDED_RELEASE_TABLET | ORAL | 2 refills | Status: DC

## 2022-08-12 MED ORDER — CLOPIDOGREL BISULFATE 75 MG PO TABS: 75.0000 mg | ORAL_TABLET | Freq: Every day | ORAL | 0 refills | Status: DC

## 2022-08-12 NOTE — Patient Instructions (Signed)
Stop Metoprolol.  We have changed this to a different blood pressure medication called carvedilol 12.5 mg twice a day.  Continue to monitor your blood pressure.  Follow-up with the clinical pharmacist in 2 weeks for repeat blood pressure check.  Stop metformin.

## 2022-08-12 NOTE — Progress Notes (Signed)
Patient ID: [name], male    DOB: 1961/04/25  MRN: [id number]  CC: Chronic disease management Hypertension (HTN & DM f/u. /Declined flu vax.)   Subjective: [name] is a 61 y.o. male who presents for chronic disease management His concerns today include:  DM, CKD 3 with macro-albuminuria, HTN, HL, CAD s/p CABG x4, and bicuspid AV s/p AVR 2017, anemia, TIA, atherosclerosis of intracranial arteries, OSA (not able to tolerate his CPAP, decline referral for Inspire consideration).     Patient has all of his medications with him today except allopurinol which he states he left at home.  Medication reconciliation was done.  He has a weekly pillbox that he feels himself.  DM/obesity:  Results for orders placed or performed in visit on 08/12/22  POCT glucose (manual entry)  Result Value Ref Range   POC Glucose 132 (A) 70 - 99 mg/dl  POCT glycosylated hemoglobin (Hb A1C)  Result Value Ref Range   Hemoglobin A1C     HbA1c POC (<> result, manual entry)     HbA1c, POC (prediabetic range)     HbA1c, POC (controlled diabetic range) 5.8 0.0 - 7.0 %  Currently on metformin 250 mg daily and Farxiga 10 mg daily. Checks BS QOD.  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  He should be on lisinopril 40 mg daily, Metoprolol 25 mg BID, furosemide 40 mg q. Monday Wednesday and Friday with potassium supplement 20 mEq on days with furosemide, amlodipine 10 mg daily.  He reports compliance with medications. Checks BP QOD.  Reading this a.m at home was 134/84 Limits salt No CP/SOB/LE edema/HA/dizziness No bruising or bleeding on Plavix Reports compliance with atorvastatin 80 mg, Zetia 10 mg, he should be on lisinopril 40 mg daily, Metoprolol 25 mg BID, furosemide 40 mg q. Monday Wednesday and Friday with potassium supplement 20 mEq on days with  furosemide, amlodipine 10 mg daily  OSA: He continues not to use his CPAP.  He has been intolerant of it.  On last visit he declined referral to ENT to be considered for inspire or.  CKD: patient seen by neph Dr. Candiss Norse 05/31/2022.  Has f/u appt 09/2022 Diagnosed with CKD stage III etiology likely related to DM, HTN and renal vascular disease.  Patient on maximum dose of lisinopril and Farxiga. Proteinurea: Worsening albuminuria over the years possibly related to FSGS/diabetic kidney disease.  On ACE and SGLT2i Anemia of renal disease.  Hb 11.9.  I note that in March of this year patient's hemoglobin was 13.  He is up-to-date with colon cancer screening.  Did Cologuard test a few months ago that was negative. Labs: Albumin/creatinine ratio of 1639. Hemoglobin of 11.9 GFR of 40 Creatinine 1.88  HM:  declines flu shot.  Had both shingles vaccine at [facility] since last visit.  Due for COVID booster.  Declines RSV.  Agrees for Tdapt today Patient Active Problem List   Diagnosis Date Noted   Influenza vaccine refused 10/09/2020   OSA on CPAP 02/06/2019   Macroalbuminuric diabetic nephropathy ([place]) 02/06/2019   TIA (transient ischemic attack) 04/10/2018   Colon cancer screening 05/04/2017   CVA (cerebral infarction) 12/11/2015   Acute ischemic VBA thalamic stroke [facility])    Atherosclerosis of native coronary artery of native heart without angina pectoris    H/O heart bypass surgery  Benign essential HTN    Type 2 diabetes mellitus without complication, without long-term current use of insulin (HCC)    S/P AVR 11/25/2015   CAD (coronary artery disease)    Chronic periodontitis 11/21/2015   Bicuspid aortic valve    Controlled type 2 diabetes mellitus with diabetic nephropathy, without long-term current use of insulin ([place])    Essential hypertension 02/24/2015   Dyslipidemia 02/24/2015   GERD (gastroesophageal reflux disease) 05/02/2013   Primary gout 05/02/2013   Allergic rhinitis due to  allergen 07/09/2010     Current Outpatient Medications on File Prior to Visit  Medication Sig Dispense Refill   allopurinol (ZYLOPRIM) 300 MG tablet Take 1 tablet (300 mg total) by mouth daily. 90 tablet 1   atorvastatin (LIPITOR) 80 MG tablet TAKE 1 TABLET BY MOUTH ONCE DAILY AT 6 IN THE EVENING 90 tablet 1   ciclopirox (PENLAC) 8 % solution Apply topically at bedtime. Apply over nail and surrounding skin. Apply daily over previous coat. After seven (7) days, may remove with alcohol and continue cycle. 6.6 mL 0   EQ ALLERGY RELIEF 10 MG tablet Take 1 tablet by mouth once daily 30 tablet 2   Erythromycin 2 % PADS Apply 1 application topically daily. 60 each 0   ezetimibe (ZETIA) 10 MG tablet Take 1 tablet by mouth once daily 90 tablet 0   FARXIGA 10 MG TABS tablet Take 10 mg by mouth daily.     fluticasone (FLONASE) 50 MCG/ACT nasal spray Place 2 sprays into both nostrils daily. 16 g 3   furosemide (LASIX) 40 MG tablet Take 1 tablet (40 mg total) by mouth every Monday, Wednesday, and Friday. 30 tablet 5   triamcinolone cream (KENALOG) 0.1 % Apply 1 Application topically 2 (two) times daily. 30 g 0   No current facility-administered medications on file prior to visit.    Allergies  Allergen Reactions   Other Anaphylaxis    mushrooms   Hydrocodone-Acetaminophen Itching    Social History   Socioeconomic History   Marital status: Married    Spouse name: Not on file   Number of children: Not on file   Years of education: Not on file   Highest education level: Not on file  Occupational History   Not on file  Tobacco Use   Smoking status: Never   Smokeless tobacco: Never  Vaping Use   Vaping Use: Never used  Substance and Sexual Activity   Alcohol use: Yes    Comment: occasionally   Drug use: Yes    Types: Marijuana    Comment: pt states occasionally   Sexual activity: Yes  Other Topics Concern   Not on file  Social History Narrative   Not on file   Social Determinants  of Health   Financial Resource Strain: Not on file  Food Insecurity: Not on file  Transportation Needs: Not on file  Physical Activity: Not on file  Stress: Not on file  Social Connections: Not on file  Intimate Partner Violence: Not on file    Family History  Problem Relation Age of Onset   Diabetes Father    Hypertension Father    Hypertension Mother    Hypertension Sister    Hypertension Brother    Hypertension Brother    Hypertension Brother    Colon cancer Neg Hx    Esophageal cancer Neg Hx    Rectal cancer Neg Hx    Stomach cancer Neg Hx    Colon polyps Neg Hx  Past Surgical History:  Procedure Laterality Date   AORTIC VALVE REPLACEMENT N/A 11/25/2015   Procedure: AORTIC VALVE REPLACEMENT (AVR) USING A 23MM MAGNA EASE ;  Surgeon: Gaye Pollack, MD;  Location: [place] [facility];  Service: Open Heart Surgery;  Laterality: N/A;   CARDIAC CATHETERIZATION N/A 11/19/2015   Procedure: Right/Left Heart Cath and Coronary Angiography;  Surgeon: Belva Crome, MD;  Location: [place] [facility];  Service: Cardiovascular;  Laterality: N/A;   COLONOSCOPY  2004   CORONARY ARTERY BYPASS GRAFT N/A 11/25/2015   Procedure: CORONARY ARTERY BYPASS GRAFTING (CABG)x4 LIMA-LAD; SVG-OM; SVG-RCA; SVG-DIAG;  Surgeon: Gaye Pollack, MD;  Location: [place];  Service: Open Heart Surgery;  Laterality: N/A;   CYST REMOVAL TRUNK N/A 08/24/2016   Procedure: EXCISION OF BACK ABSCESS;  Surgeon: Judeth Horn, MD;  Location: [place];  Service: General;  Laterality: N/A;   FINGER AMPUTATION Right 1980's   3rd & 4th digits "got them mashed" (01/17/2013)   KNEE ARTHROSCOPY Right 1980's   MULTIPLE EXTRACTIONS WITH ALVEOLOPLASTY Bilateral 11/21/2015   Procedure: Extraction of tooth #'s 2,12 with alveoloplasty and gross debridement of remaining dentition;  Surgeon: Lenn Cal, DDS;  Location: [place];  Service: Oral Surgery;  Laterality: Bilateral;   TEE WITHOUT CARDIOVERSION N/A 11/20/2015   Procedure:  TRANSESOPHAGEAL ECHOCARDIOGRAM (TEE);  Surgeon: Larey Dresser, MD;  Location: [place];  Service: Cardiovascular;  Laterality: N/A;   TEE WITHOUT CARDIOVERSION N/A 11/25/2015   Procedure: TRANSESOPHAGEAL ECHOCARDIOGRAM (TEE);  Surgeon: Gaye Pollack, MD;  Location: [place];  Service: Open Heart Surgery;  Laterality: N/A;    ROS: Review of Systems Negative except as stated above  PHYSICAL EXAM: BP 138/84   Pulse 68   Temp 98.4 F (36.9 C) (Oral)   Ht 5\' 6"  (1.676 m)   Wt 214 lb (97.1 kg)   SpO2 100%   BMI 34.54 kg/m   Wt Readings from Last 3 Encounters:  08/12/22 214 lb (97.1 kg)  03/12/22 217 lb 9.6 oz (98.7 kg)  11/04/21 211 lb 9.6 oz (96 kg)    Physical Exam General appearance - alert, well appearing, older African-American male and in no distress Mental status - normal mood, behavior, speech, dress, motor activity, and thought processes Neck - supple, no significant adenopathy Chest - clear to auscultation, no wheezes, rales or rhonchi, symmetric air entry Heart - normal rate, regular rhythm, soft grade 2/6 systolic ejection murmur heard along the left sternal border and at PMI Breast: Patient noted to have mild to moderate gynecomastia bilaterally Extremities - peripheral pulses normal, no pedal edema, no clubbing or cyanosis     Latest Ref Rng & Units 12/03/2021    9:10 AM 11/04/2021   10:28 AM 10/09/2020    3:20 PM  CMP  Glucose 70 - 99 mg/dL 113  134  100   BUN 8 - 27 mg/dL 28  33  24   Creatinine 0.76 - 1.27 mg/dL 1.71  2.03  1.40   Sodium 134 - 144 mmol/L 140  140  142   Potassium 3.5 - 5.2 mmol/L 4.7  4.5  4.4   Chloride 96 - 106 mmol/L 109  109  110   CO2 20 - 29 mmol/L 18  17  19    Calcium 8.6 - 10.2 mg/dL 9.2  8.9  9.0   Total Protein 6.0 - 8.5 g/dL  6.8  6.7   Total Bilirubin 0.0 - 1.2 mg/dL  <0.2  0.3  Alkaline Phos 44 - 121 IU/L  135  127   AST 0 - 40 IU/L  40  26   ALT 0 - 44 IU/L  11  10    Lipid Panel     Component Value Date/Time   CHOL 171  11/04/2021 1028   TRIG 117 11/04/2021 1028   HDL 60 11/04/2021 1028   CHOLHDL 2.9 11/04/2021 1028   CHOLHDL 5.4 12/11/2015 0609   VLDL 35 12/11/2015 0609   LDLCALC 90 11/04/2021 1028    CBC    Component Value Date/Time   WBC 6.0 11/04/2021 1028   WBC 8.7 04/10/2018 1648   RBC 4.38 11/04/2021 1028   RBC 4.41 04/10/2018 1648   HGB 13.1 11/04/2021 1028   HCT 39.7 11/04/2021 1028   PLT 265 11/04/2021 1028   MCV 91 11/04/2021 1028   MCH 29.9 11/04/2021 1028   MCH 30.6 04/10/2018 1648   MCHC 33.0 11/04/2021 1028   MCHC 33.9 04/10/2018 1648   RDW 12.4 11/04/2021 1028   LYMPHSABS 1.9 11/04/2021 1028   MONOABS 0.9 04/10/2018 1648   EOSABS 0.2 11/04/2021 1028   BASOSABS 0.1 11/04/2021 1028    ASSESSMENT AND PLAN: 1. Diabetes mellitus with macroalbuminuric diabetic nephropathy ([place]) Diabetes is at goal.  He is on low-dose metformin and Farxiga 10 mg.  I will have him stop the metformin and continue the [place]. Continue to encourage healthy eating habits and continue to move is much as he can.  2. Hypertension associated with stage 3 chronic kidney disease due to type 2 diabetes mellitus ([place]) Blood pressure not at goal. I recommend change metoprolol to carvedilol for better blood pressure lowering effect.  Follow-up with clinical pharmacist in several weeks for repeat blood pressure check.  Patient expressed understanding of needing to stop the metoprolol.  I took this bottle out of his med bag and put an X on it. - amLODipine (NORVASC) 10 MG tablet; Take 1 tablet (10 mg total) by mouth daily.  Dispense: 90 tablet; Refill: 1 - lisinopril (ZESTRIL) 40 MG tablet; Take 1 tablet (40 mg total) by mouth daily.  Dispense: 90 tablet; Refill: 1 - potassium chloride SA (KLOR-CON M) 20 MEQ tablet; TAKE 1 TABLET BY MOUTH ON MONDAY, WEDNESDAY AND FRIDAY.  Dispense: 12 tablet; Refill: 2 - Basic metabolic panel - carvedilol (COREG) 12.5 MG tablet; Take 1 tablet (12.5 mg total) by mouth 2 (two)  times daily with a meal. Stop metoprolol  Dispense: 60 tablet; Refill: 6  3. Type 2 diabetes mellitus with obesity ([place]) See #1 above. - POCT glucose (manual entry) - POCT glycosylated hemoglobin (Hb A1C)  4. Coronary artery disease involving coronary bypass graft of native heart without angina pectoris Stable.  Metoprolol changed to carvedilol for better blood pressure lowering effect. - clopidogrel (PLAVIX) 75 MG tablet; Take 1 tablet (75 mg total) by mouth daily.  Dispense: 90 tablet; Refill: 0  5. Normocytic anemia We will recheck CBC today with iron studies - CBC - Iron, TIBC and Ferritin Panel  6. Need for Tdap vaccination Tdap given today. Encouraged him to get COVID booster Patient declines RSV vaccine.    Patient was given the opportunity to ask questions.  Patient verbalized understanding of the plan and was able to repeat key elements of the plan.   This documentation was completed using Radio producer.  Any transcriptional errors are unintentional.  Orders Placed This Encounter  Procedures   Tdap vaccine greater than or equal to 7yo  IM   CBC   Basic metabolic panel   Iron, TIBC and Ferritin Panel   POCT glucose (manual entry)   POCT glycosylated hemoglobin (Hb A1C)     Requested Prescriptions   Signed Prescriptions Disp Refills   amLODipine (NORVASC) 10 MG tablet 90 tablet 1    Sig: Take 1 tablet (10 mg total) by mouth daily.   clopidogrel (PLAVIX) 75 MG tablet 90 tablet 0    Sig: Take 1 tablet (75 mg total) by mouth daily.   lisinopril (ZESTRIL) 40 MG tablet 90 tablet 1    Sig: Take 1 tablet (40 mg total) by mouth daily.   potassium chloride SA (KLOR-CON M) 20 MEQ tablet 12 tablet 2    Sig: TAKE 1 TABLET BY MOUTH ON MONDAY, WEDNESDAY AND FRIDAY.   carvedilol (COREG) 12.5 MG tablet 60 tablet 6    Sig: Take 1 tablet (12.5 mg total) by mouth 2 (two) times daily with a meal. Stop metoprolol    Return in about 4 months (around  12/12/2022) for Give appt with CMA/RN for Medicare Wellness visit in 2 wk/ appt [facility] for BP check.  Jonah Blue, MD, FACP

## 2022-08-13 LAB — BASIC METABOLIC PANEL
BUN/Creatinine Ratio: 22 (ref 10–24)
BUN: 42 mg/dL — ABNORMAL HIGH (ref 8–27)
CO2: 15 mmol/L — ABNORMAL LOW (ref 20–29)
Calcium: 9.4 mg/dL (ref 8.6–10.2)
Chloride: 108 mmol/L — ABNORMAL HIGH (ref 96–106)
Creatinine, Ser: 1.94 mg/dL — ABNORMAL HIGH (ref 0.76–1.27)
Glucose: 120 mg/dL — ABNORMAL HIGH (ref 70–99)
Potassium: 5 mmol/L (ref 3.5–5.2)
Sodium: 140 mmol/L (ref 134–144)
eGFR: 38 mL/min/{1.73_m2} — ABNORMAL LOW (ref >59)

## 2022-08-13 LAB — IRON,TIBC AND FERRITIN PANEL
Ferritin: 537 ng/mL — ABNORMAL HIGH (ref 30–400)
Iron Saturation: 31 % (ref 15–55)
Iron: 83 ug/dL (ref 38–169)
Total Iron Binding Capacity: 264 ug/dL (ref 250–450)
UIBC: 181 ug/dL (ref 111–343)

## 2022-08-13 LAB — CBC
Hematocrit: 29.2 % — ABNORMAL LOW (ref 37.5–51.0)
Hemoglobin: 10.2 g/dL — ABNORMAL LOW (ref 13.0–17.7)
MCH: 32.7 pg (ref 26.6–33.0)
MCHC: 34.9 g/dL (ref 31.5–35.7)
MCV: 94 fL (ref 79–97)
Platelets: 252 10*3/uL (ref 150–450)
RBC: 3.12 x10E6/uL — ABNORMAL LOW (ref 4.14–5.80)
RDW: 15.5 % — ABNORMAL HIGH (ref 11.6–15.4)
WBC: 8.7 10*3/uL (ref 3.4–10.8)

## 2022-08-14 ENCOUNTER — Telehealth: Payer: Self-pay | Admitting: Internal Medicine

## 2022-08-14 DIAGNOSIS — I10 Essential (primary) hypertension: Secondary | ICD-10-CM

## 2022-08-14 DIAGNOSIS — E1122 Type 2 diabetes mellitus with diabetic chronic kidney disease: Secondary | ICD-10-CM

## 2022-08-14 MED ORDER — POTASSIUM CHLORIDE CRYS ER 20 MEQ PO TBCR: EXTENDED_RELEASE_TABLET | ORAL | 2 refills | Status: DC

## 2022-08-14 MED ORDER — FUROSEMIDE 40 MG PO TABS: ORAL_TABLET | ORAL | 3 refills | Status: DC

## 2022-08-14 NOTE — Telephone Encounter (Signed)
Phone call placed to patient this morning.  Patient informed that his kidney function is still not at 100%.  GFR when he saw his kidney specialist in September was 103.  This time it is 38.  BUN and creatinine a little bit more elevated with creatinine of 1.9 and BUN of 42. -Given that he is now on Farxiga which can cause some diuresis and he was euvolemic on exam yesterday, I recommend changing furosemide and potassium supplement from dosing Monday Wednesday and Fridays to dosing only on Monday and Fridays.  Patient expressed understanding and was able to repeat back instructions. -Patient also advised that he is still anemic.  This is a normocytic anemia and iron studies seem consistent with anemia of chronic disease.  He has not had any major weight changes.  Up-to-date with colon cancer screening.  I think this anemia is likely due to his chronic kidney disease.  We will continue to monitor.  Results for orders placed or performed in visit on 08/12/22  CBC  Result Value Ref Range   WBC 8.7 3.4 - 10.8 x10E3/uL   RBC 3.12 (L) 4.14 - 5.80 x10E6/uL   Hemoglobin 10.2 (L) 13.0 - 17.7 g/dL   Hematocrit 29.2 (L) 37.5 - 51.0 %   MCV 94 79 - 97 fL   MCH 32.7 26.6 - 33.0 pg   MCHC 34.9 31.5 - 35.7 g/dL   RDW 15.5 (H) 11.6 - 15.4 %   Platelets 252 150 - 450 [id number]/TM  Basic metabolic panel  Result Value Ref Range   Glucose 120 (H) 70 - 99 mg/dL   BUN 42 (H) 8 - 27 mg/dL   Creatinine, Ser 1.94 (H) 0.76 - 1.27 mg/dL   eGFR 38 (L) >59 mL/min/1.73   BUN/Creatinine Ratio 22 10 - 24   Sodium 140 134 - 144 mmol/L   Potassium 5.0 3.5 - 5.2 mmol/L   Chloride 108 (H) 96 - 106 mmol/L   CO2 15 (L) 20 - 29 mmol/L   Calcium 9.4 8.6 - 10.2 mg/dL  Iron, TIBC and Ferritin Panel  Result Value Ref Range   Total Iron Binding Capacity 264 250 - 450 ug/dL   UIBC 181 111 - 343 ug/dL   Iron 83 38 - 169 ug/dL   Iron Saturation 31 15 - 55 %   Ferritin 537 (H) 30 - 400 ng/mL  POCT glucose (manual entry)  Result Value  Ref Range   POC Glucose 132 (A) 70 - 99 mg/dl  POCT glycosylated hemoglobin (Hb A1C)  Result Value Ref Range   Hemoglobin A1C     HbA1c POC (<> result, manual entry)     HbA1c, POC (prediabetic range)     HbA1c, POC (controlled diabetic range) 5.8 0.0 - 7.0 %

## 2022-08-17 NOTE — Telephone Encounter (Signed)
Called the [employer] and spoke to [place] G and confirmed the new medication frequency.

## 2022-09-21 DIAGNOSIS — N2581 Secondary hyperparathyroidism of renal origin: Secondary | ICD-10-CM | POA: Diagnosis not present

## 2022-09-21 DIAGNOSIS — I129 Hypertensive chronic kidney disease with stage 1 through stage 4 chronic kidney disease, or unspecified chronic kidney disease: Secondary | ICD-10-CM | POA: Diagnosis not present

## 2022-09-21 DIAGNOSIS — N183 Chronic kidney disease, stage 3 unspecified: Secondary | ICD-10-CM | POA: Diagnosis not present

## 2022-09-21 DIAGNOSIS — D631 Anemia in chronic kidney disease: Secondary | ICD-10-CM | POA: Diagnosis not present

## 2022-09-21 DIAGNOSIS — R809 Proteinuria, unspecified: Secondary | ICD-10-CM | POA: Diagnosis not present

## 2022-09-23 ENCOUNTER — Other Ambulatory Visit: Payer: Self-pay | Admitting: Internal Medicine

## 2022-09-23 DIAGNOSIS — E1169 Type 2 diabetes mellitus with other specified complication: Secondary | ICD-10-CM

## 2022-09-28 ENCOUNTER — Ambulatory Visit: Payer: PPO | Admitting: Pharmacist

## 2022-09-28 ENCOUNTER — Ambulatory Visit: Payer: PPO | Attending: Family Medicine | Admitting: Family Medicine

## 2022-09-28 ENCOUNTER — Encounter: Payer: Self-pay | Admitting: Family Medicine

## 2022-09-28 VITALS — BP 90/59 | HR 64 | Temp 98.0°F | Ht 66.0 in | Wt 203.0 lb

## 2022-09-28 DIAGNOSIS — J0100 Acute maxillary sinusitis, unspecified: Secondary | ICD-10-CM

## 2022-09-28 DIAGNOSIS — R052 Subacute cough: Secondary | ICD-10-CM

## 2022-09-28 LAB — POCT INFLUENZA A/B
Influenza A, POC: NEGATIVE
Influenza B, POC: NEGATIVE

## 2022-09-28 MED ORDER — AMOXICILLIN-POT CLAVULANATE 875-125 MG PO TABS: 1.0000 | ORAL_TABLET | Freq: Two times a day (BID) | ORAL | 0 refills | Status: DC

## 2022-09-28 NOTE — Progress Notes (Signed)
Subjective:  Patient ID: [name], male    DOB: 1961/08/02  Age: 62 y.o. MRN: [id number]  CC: Cough   HPI [name] is a 62 y.o. year old male with a history of hyperlipidemia, hypertension, type 2 diabetes mellitus, stage III CKD, CAD, OSA, gout. Seen by PCP for chronic disease management 1 month ago.  Interval History:  He Complains of 2 week history of cough ever since he went to his Nephrology office and 'they were coughing all over the place.' He Complains of dyspnea, wheezing, rhinorrhea, myalgia. He has not had a fever. Also feeling weak and has been falling, he has had a headache. Has also lost his appetite. He had a negative home COVID test. He has been 'quarantining in his room'. He received his COVID booster shot last month. Past Medical History:  Diagnosis Date   Allergy    Anemia    Anxiety    Baker's cyst of knee    Blood transfusion without reported diagnosis    as baby    Coronary artery disease    quadruple bypass - March 2016   GERD (gastroesophageal reflux disease)    Gouty arthritis    "real bad" (01/17/2013)   Heart murmur    Hypercholesteremia    Hypertension    Myocardial infarction (HCC) 2017   Stroke (HCC)    Type II diabetes mellitus (HCC)     Past Surgical History:  Procedure Laterality Date   AORTIC VALVE REPLACEMENT N/A 11/25/2015   Procedure: AORTIC VALVE REPLACEMENT (AVR) USING A MAGNA EASE ;  Surgeon: Alleen Borne, MD;  Location: [facility] OR;  Service: Open Heart Surgery;  Laterality: N/A;   CARDIAC CATHETERIZATION N/A 11/19/2015   Procedure: Right/Left Heart Cath and Coronary Angiography;  Surgeon: Lyn Records, MD;  Location: [facility] INVASIVE CV LAB;  Service: Cardiovascular;  Laterality: N/A;   COLONOSCOPY  2004   CORONARY ARTERY BYPASS GRAFT N/A 11/25/2015   Procedure: CORONARY ARTERY BYPASS GRAFTING (CABG)x4 LIMA-LAD; SVG-OM; SVG-RCA; SVG-DIAG;  Surgeon: Alleen Borne, MD;  Location: [facility] OR;  Service: Open Heart Surgery;  Laterality:  N/A;   CYST REMOVAL TRUNK N/A 08/24/2016   Procedure: EXCISION OF BACK ABSCESS;  Surgeon: Jimmye Norman, MD;  Location: [facility];  Service: General;  Laterality: N/A;   FINGER AMPUTATION Right 1980's   3rd & 4th digits "got them mashed" (01/17/2013)   KNEE ARTHROSCOPY Right 1980's   MULTIPLE EXTRACTIONS WITH ALVEOLOPLASTY Bilateral 11/21/2015   Procedure: Extraction of tooth #'s 2,12 with alveoloplasty and gross debridement of remaining dentition;  Surgeon: Charlynne Pander, DDS;  Location: [facility] OR;  Service: Oral Surgery;  Laterality: Bilateral;   TEE WITHOUT CARDIOVERSION N/A 11/20/2015   Procedure: TRANSESOPHAGEAL ECHOCARDIOGRAM (TEE);  Surgeon: Laurey Morale, MD;  Location: [facility] ENDOSCOPY;  Service: Cardiovascular;  Laterality: N/A;   TEE WITHOUT CARDIOVERSION N/A 11/25/2015   Procedure: TRANSESOPHAGEAL ECHOCARDIOGRAM (TEE);  Surgeon: Alleen Borne, MD;  Location[facility] OR;  Service: Open Heart Surgery;  Laterality: N/A;    Family History  Problem Relation Age of Onset   Diabetes Father    Hypertension Father    Hypertension Mother    Hypertension Sister    Hypertension Brother    Hypertension Brother    Hypertension Brother    Colon cancer Neg Hx    Esophageal cancer Neg Hx    Rectal cancer Neg Hx    Stomach cancer Neg Hx    Colon polyps Neg Hx  Social History   Socioeconomic History   Marital status: Married    Spouse name: Not on file   Number of children: Not on file   Years of education: Not on file   Highest education level: Not on file  Occupational History   Not on file  Tobacco Use   Smoking status: Never   Smokeless tobacco: Never  Vaping Use   Vaping Use: Never used  Substance and Sexual Activity   Alcohol use: Yes    Comment: occasionally   Drug use: Yes    Types: Marijuana    Comment: pt states occasionally   Sexual activity: Yes  Other Topics Concern   Not on file  Social History Narrative   Not on file   Social Determinants of  Health   Financial Resource Strain: Not on file  Food Insecurity: Not on file  Transportation Needs: Not on file  Physical Activity: Not on file  Stress: Not on file  Social Connections: Not on file    Allergies  Allergen Reactions   Other Anaphylaxis    mushrooms   Hydrocodone-Acetaminophen Itching    Outpatient Medications Prior to Visit  Medication Sig Dispense Refill   allopurinol (ZYLOPRIM) 300 MG tablet Take 1 tablet (300 mg total) by mouth daily. 90 tablet 1   amLODipine (NORVASC) 10 MG tablet Take 1 tablet (10 mg total) by mouth daily. 90 tablet 1   atorvastatin (LIPITOR) 80 MG tablet TAKE 1 TABLET BY MOUTH ONCE DAILY AT 6 IN THE EVENING 90 tablet 1   carvedilol (COREG) 12.5 MG tablet Take 1 tablet (12.5 mg total) by mouth 2 (two) times daily with a meal. Stop metoprolol 60 tablet 6   ciclopirox (PENLAC) 8 % solution Apply topically at bedtime. Apply over nail and surrounding skin. Apply daily over previous coat. After seven (7) days, may remove with alcohol and continue cycle. 6.6 mL 0   clopidogrel (PLAVIX) 75 MG tablet Take 1 tablet (75 mg total) by mouth daily. 90 tablet 0   EQ ALLERGY RELIEF 10 MG tablet Take 1 tablet by mouth once daily 30 tablet 2   Erythromycin 2 % PADS Apply 1 application topically daily. 60 each 0   ezetimibe (ZETIA) 10 MG tablet Take 1 tablet by mouth once daily 90 tablet 0   FARXIGA 10 MG TABS tablet Take 10 mg by mouth daily.     fluticasone (FLONASE) 50 MCG/ACT nasal spray Place 2 sprays into both nostrils daily. 16 g 3   furosemide (LASIX) 40 MG tablet 1 tablet p.o. q. Mondays and Fridays. 24 tablet 3   lisinopril (ZESTRIL) 40 MG tablet Take 1 tablet (40 mg total) by mouth daily. 90 tablet 1   potassium chloride SA (KLOR-CON M) 20 MEQ tablet TAKE 1 TABLET BY MOUTH ON MONDAY, AND FRIDAY. 24 tablet 2   triamcinolone cream (KENALOG) 0.1 % Apply 1 Application topically 2 (two) times daily. 30 g 0   No facility-administered medications prior to  visit.     ROS Review of Systems  Constitutional:  Positive for fatigue. Negative for activity change and appetite change.  HENT:  Positive for rhinorrhea and sinus pressure. Negative for sore throat.   Respiratory:  Positive for cough, shortness of breath and wheezing. Negative for chest tightness.   Cardiovascular:  Negative for chest pain and palpitations.  Gastrointestinal:  Negative for abdominal distention, abdominal pain and constipation.  Genitourinary: Negative.   Musculoskeletal: Negative.   Neurological:  Positive for headaches.  Psychiatric/Behavioral:  Negative for behavioral problems and dysphoric mood.     Objective:  BP (!) 90/59   Pulse 64   Temp 98 F (36.7 C) (Oral)   Ht 5\' 6"  (1.676 m)   Wt 203 lb (92.1 kg)   SpO2 95%   BMI 32.77 kg/m      09/28/2022   10:10 AM 08/12/2022   12:52 PM 08/12/2022    8:39 AM  BP/Weight  Systolic BP 90 138 141  Diastolic BP 59 84 91  Wt. (Lbs) 203  214  BMI 32.77 kg/m2  34.54 kg/m2      Physical Exam Constitutional:      Appearance: He is well-developed.  HENT:     Head:     Comments: Tenderness on palpation of frontal and maxillary sinuses Cardiovascular:     Rate and Rhythm: Normal rate.     Heart sounds: Normal heart sounds. No murmur heard. Pulmonary:     Effort: Pulmonary effort is normal.     Breath sounds: Normal breath sounds. No wheezing or rales.  Chest:     Chest wall: No tenderness.  Abdominal:     General: Bowel sounds are normal. There is no distension.     Palpations: Abdomen is soft. There is no mass.     Tenderness: There is no abdominal tenderness.  Musculoskeletal:        General: Normal range of motion.     Right lower leg: No edema.     Left lower leg: No edema.  Neurological:     Mental Status: He is alert and oriented to person, place, and time.  Psychiatric:        Mood and Affect: Mood normal.        Latest Ref Rng & Units 08/12/2022    9:34 AM 12/03/2021    9:10 AM 11/04/2021    10:28 AM  CMP  Glucose 70 - 99 mg/dL 01/04/2022  272  536   BUN 8 - 27 mg/dL 42  28  33   Creatinine 0.76 - 1.27 mg/dL 644  0.34  7.42   Sodium 134 - 144 mmol/L 140  140  140   Potassium 3.5 - 5.2 mmol/L 5.0  4.7  4.5   Chloride 96 - 106 mmol/L 108  109  109   CO2 20 - 29 mmol/L 15  18  17    Calcium 8.6 - 10.2 mg/dL 9.4  9.2  8.9   Total Protein 6.0 - 8.5 g/dL   6.8   Total Bilirubin 0.0 - 1.2 mg/dL   5.95   Alkaline Phos 44 - 121 IU/L   135   AST 0 - 40 IU/L   40   ALT 0 - 44 IU/L   11     Lipid Panel     Component Value Date/Time   CHOL 171 11/04/2021 1028   TRIG 117 11/04/2021 1028   HDL 60 11/04/2021 1028   CHOLHDL 2.9 11/04/2021 1028   CHOLHDL 5.4 12/11/2015 0609   VLDL 35 12/11/2015 0609   LDLCALC 90 11/04/2021 1028    CBC    Component Value Date/Time   WBC 8.7 08/12/2022 0934   WBC 8.7 04/10/2018 1648   RBC 3.12 (L) 08/12/2022 0934   RBC 4.41 04/10/2018 1648   HGB 10.2 (L) 08/12/2022 0934   HCT 29.2 (L) 08/12/2022 0934   PLT 252 08/12/2022 0934   MCV 94 08/12/2022 0934   MCH 32.7 08/12/2022 0934   MCH 30.6 04/10/2018 1648  MCHC 34.9 08/12/2022 0934   MCHC 33.9 04/10/2018 1648   RDW 15.5 (H) 08/12/2022 0934   LYMPHSABS 1.9 11/04/2021 1028   MONOABS 0.9 04/10/2018 1648   EOSABS 0.2 11/04/2021 1028   BASOSABS 0.1 11/04/2021 1028    Lab Results  Component Value Date   HGBA1C 5.8 08/12/2022    Assessment & Plan:  1. Subacute cough Oxygen saturation is 95% today compared to 100% at his last visit with PCP Will need to exclude pneumonia versus COVID-19 infection Acute sinusitis is also possibility - DG Chest 2 View; Future - Novel Coronavirus, NAA ([facility]) - POCT Influenza A/B  2. Acute non-recurrent maxillary sinusitis He does have sinus tenderness - amoxicillin-clavulanate (AUGMENTIN) 875-125 MG tablet; Take 1 tablet by mouth 2 (two) times daily.  Dispense: 20 tablet; Refill: 0    Meds ordered this encounter  Medications    amoxicillin-clavulanate (AUGMENTIN) 875-125 MG tablet    Sig: Take 1 tablet by mouth 2 (two) times daily.    Dispense:  20 tablet    Refill:  0    Follow-up: Return for previously scheduled appointment.       Charlott Rakes, MD, FAAFP. [facility] [place], [place]   09/28/2022, 11:35 AM

## 2022-09-28 NOTE — Patient Instructions (Signed)

## 2022-09-28 NOTE — Progress Notes (Signed)
Cough for   2 weeks

## 2022-09-29 LAB — NOVEL CORONAVIRUS, NAA: SARS-CoV-2, NAA: NOT DETECTED

## 2022-09-30 ENCOUNTER — Telehealth: Payer: Self-pay | Admitting: Internal Medicine

## 2022-09-30 NOTE — Telephone Encounter (Signed)
Negative COVID results given. Patient results "NOT Detected." Caller expressed understanding. ° °

## 2022-10-04 ENCOUNTER — Ambulatory Visit
Admission: RE | Admit: 2022-10-04 | Discharge: 2022-10-04 | Disposition: A | Discharge status: Home / Self Care | Payer: PPO | Source: Ambulatory Visit | Attending: Family Medicine | Admitting: Family Medicine

## 2022-10-04 DIAGNOSIS — R0602 Shortness of breath: Secondary | ICD-10-CM | POA: Diagnosis not present

## 2022-10-04 DIAGNOSIS — R059 Cough, unspecified: Secondary | ICD-10-CM | POA: Diagnosis not present

## 2022-10-04 DIAGNOSIS — R052 Subacute cough: Secondary | ICD-10-CM

## 2022-10-11 ENCOUNTER — Ambulatory Visit (HOSPITAL_COMMUNITY)
Admission: EM | Admit: 2022-10-11 | Discharge: 2022-10-11 | Disposition: A | Discharge status: Home / Self Care | Payer: PPO | Attending: Family Medicine | Admitting: Family Medicine

## 2022-10-11 ENCOUNTER — Ambulatory Visit (INDEPENDENT_AMBULATORY_CARE_PROVIDER_SITE_OTHER): Payer: PPO

## 2022-10-11 ENCOUNTER — Encounter (HOSPITAL_COMMUNITY): Payer: Self-pay | Admitting: Emergency Medicine

## 2022-10-11 ENCOUNTER — Other Ambulatory Visit: Payer: Self-pay

## 2022-10-11 DIAGNOSIS — R059 Cough, unspecified: Secondary | ICD-10-CM | POA: Diagnosis not present

## 2022-10-11 DIAGNOSIS — R051 Acute cough: Secondary | ICD-10-CM

## 2022-10-11 MED ORDER — BENZONATATE 100 MG PO CAPS: 100.0000 mg | ORAL_CAPSULE | Freq: Three times a day (TID) | ORAL | 0 refills | Status: DC | PRN

## 2022-10-11 NOTE — ED Triage Notes (Addendum)
Reports having symptoms 2 weeks ago.  Saw pcp last week, was diagnosed with pneumonia.  Patient reports he finished antibiotics.  Pcp suggested patient come and be reevaluated with potential for more medicine.  Patient says he is feeling somewhat better .  Patient has a cough, has phlegm described as thick, green phlegm.    Patient also complains of intermittent nose bleeds since Friday.

## 2022-10-11 NOTE — Discharge Instructions (Addendum)
Your chest x-ray is clear today.  I do not think you need further antibiotics, since you are feeling better.  Often a cough can last several weeks after having an upper respiratory infection.  Take benzonatate 100 mg, 1 tab every 8 hours as needed for cough.  Rinse your nose with saline nose spray that you buy over-the-counter.

## 2022-10-11 NOTE — ED Provider Notes (Signed)
[facility]    CSN: [id number] Arrival date & time: 10/11/22  7517      History   Chief Complaint Chief Complaint  Patient presents with   Cough    HPI [name] is a 62 y.o. male.    Cough  Here for persistent cough and nosebleed and nasal congestion.  He was seen by his primary care office on January 23.  At that point he had been having lots of rhinorrhea and cough and congestion.  Chest x-ray showed by basilar opacities that could be infiltrate or atelectasis January 29th.  At that point he had already been taking his antibiotics for about 6 days  Comes in today due to the persistent cough and now he is having some epistaxis.  He does report that the congestion is much better and he feels better.  No fever recently.  COVID test was negative Past Medical History:  Diagnosis Date   Allergy    Anemia    Anxiety    Baker's cyst of knee    Blood transfusion without reported diagnosis    as baby    Coronary artery disease    quadruple bypass - March 2016   GERD (gastroesophageal reflux disease)    Gouty arthritis    "real bad" (01/17/2013)   Heart murmur    Hypercholesteremia    Hypertension    Myocardial infarction (HCC) 2017   Stroke (HCC)    Type II diabetes mellitus (HCC)     Patient Active Problem List   Diagnosis Date Noted   Influenza vaccine refused 10/09/2020   OSA on CPAP 02/06/2019   Macroalbuminuric diabetic nephropathy (HCC) 02/06/2019   TIA (transient ischemic attack) 04/10/2018   Colon cancer screening 05/04/2017   CVA (cerebral infarction) 12/11/2015   Acute ischemic VBA thalamic stroke (HCC)    Atherosclerosis of native coronary artery of native heart without angina pectoris    H/O heart bypass surgery    Benign essential HTN    Type 2 diabetes mellitus without complication, without long-term current use of insulin (HCC)    S/P AVR 11/25/2015   CAD (coronary artery disease)    Chronic periodontitis 11/21/2015   Bicuspid  aortic valve    Controlled type 2 diabetes mellitus with diabetic nephropathy, without long-term current use of insulin (HCC)    Essential hypertension 02/24/2015   Dyslipidemia 02/24/2015   GERD (gastroesophageal reflux disease) 05/02/2013   Primary gout 05/02/2013   Allergic rhinitis due to allergen 07/09/2010    Past Surgical History:  Procedure Laterality Date   AORTIC VALVE REPLACEMENT N/A 11/25/2015   Procedure: AORTIC VALVE REPLACEMENT (AVR) USING A MAGNA EASE ;  Surgeon: Alleen Borne, MD;  Location: [facility] OR;  Service: Open Heart Surgery;  Laterality: N/A;   CARDIAC CATHETERIZATION N/A 11/19/2015   Procedure: Right/Left Heart Cath and Coronary Angiography;  Surgeon: Lyn Records, MD;  Location: [facility] INVASIVE CV LAB;  Service: Cardiovascular;  Laterality: N/A;   COLONOSCOPY  2004   CORONARY ARTERY BYPASS GRAFT N/A 11/25/2015   Procedure: CORONARY ARTERY BYPASS GRAFTING (CABG)x4 LIMA-LAD; SVG-OM; SVG-RCA; SVG-DIAG;  Surgeon: Alleen Borne, MD;  Location: [facility] OR;  Service: Open Heart Surgery;  Laterality: N/A;   CYST REMOVAL TRUNK N/A 08/24/2016   Procedure: EXCISION OF BACK ABSCESS;  Surgeon: Jimmye Norman, MD;  Location: [facility];  Service: General;  Laterality: N/A;   FINGER AMPUTATION Right 1980's   3rd & 4th digits "got them mashed" (01/17/2013)  KNEE ARTHROSCOPY Right 1980's   MULTIPLE EXTRACTIONS WITH ALVEOLOPLASTY Bilateral 11/21/2015   Procedure: Extraction of tooth #'s 2,12 with alveoloplasty and gross debridement of remaining dentition;  Surgeon: Lenn Cal, DDS;  Location: [place];  Service: Oral Surgery;  Laterality: Bilateral;   TEE WITHOUT CARDIOVERSION N/A 11/20/2015   Procedure: TRANSESOPHAGEAL ECHOCARDIOGRAM (TEE);  Surgeon: Larey Dresser, MD;  Location: [place];  Service: Cardiovascular;  Laterality: N/A;   TEE WITHOUT CARDIOVERSION N/A 11/25/2015   Procedure: TRANSESOPHAGEAL ECHOCARDIOGRAM (TEE);  Surgeon: Gaye Pollack, MD;  Location: [place];  Service: Open Heart Surgery;  Laterality: N/A;       Home Medications    Prior to Admission medications   Medication Sig Start Date End Date Taking? Authorizing Provider  benzonatate (TESSALON) 100 MG capsule Take 1 capsule (100 mg total) by mouth 3 (three) times daily as needed for cough. 10/11/22  Yes Barrett Henle, MD  allopurinol (ZYLOPRIM) 300 MG tablet Take 1 tablet (300 mg total) by mouth daily. 11/04/21   Argentina Donovan, PA-C  amLODipine (NORVASC) 10 MG tablet Take 1 tablet (10 mg total) by mouth daily. 08/12/22   Ladell Pier, MD  atorvastatin (LIPITOR) 80 MG tablet TAKE 1 TABLET BY MOUTH ONCE DAILY AT 6 IN THE EVENING 06/09/22   Ladell Pier, MD  carvedilol (COREG) 12.5 MG tablet Take 1 tablet (12.5 mg total) by mouth 2 (two) times daily with a meal. Stop metoprolol 08/12/22   Ladell Pier, MD  ciclopirox [facility]) 8 % solution Apply topically at bedtime. Apply over nail and surrounding skin. Apply daily over previous coat. After seven (7) days, may remove with alcohol and continue cycle. 04/16/22   Felipa Furnace, DPM  clopidogrel (PLAVIX) 75 MG tablet Take 1 tablet (75 mg total) by mouth daily. 08/12/22   Ladell Pier, MD  EQ ALLERGY RELIEF 10 MG tablet Take 1 tablet by mouth once daily 03/06/19   Ladell Pier, MD  Erythromycin 2 % PADS Apply 1 application topically daily. 10/24/20   Felipa Furnace, DPM  ezetimibe (ZETIA) 10 MG tablet Take 1 tablet by mouth once daily 07/16/22   Ladell Pier, MD  FARXIGA 10 MG TABS tablet Take 10 mg by mouth daily. 02/04/22   [provider]  fluticasone (FLONASE) 50 MCG/ACT nasal spray Place 2 sprays into both nostrils daily. 11/04/21   Argentina Donovan, PA-C  furosemide (LASIX) 40 MG tablet 1 tablet p.o. q. Mondays and Fridays. 08/16/22   Ladell Pier, MD  lisinopril (ZESTRIL) 40 MG tablet Take 1 tablet (40 mg total) by mouth daily. 08/12/22   Ladell Pier, MD  potassium chloride SA (KLOR-CON  M) 20 MEQ tablet TAKE 1 TABLET BY MOUTH ON MONDAY, AND FRIDAY. 08/14/22   Ladell Pier, MD  triamcinolone cream (KENALOG) 0.1 % Apply 1 Application topically 2 (two) times daily. 03/12/22   Ladell Pier, MD    Family History Family History  Problem Relation Age of Onset   Diabetes Father    Hypertension Father    Hypertension Mother    Hypertension Sister    Hypertension Brother    Hypertension Brother    Hypertension Brother    Colon cancer Neg Hx    Esophageal cancer Neg Hx    Rectal cancer Neg Hx    Stomach cancer Neg Hx    Colon polyps Neg Hx     Social History Social History   Tobacco  Use   Smoking status: Never   Smokeless tobacco: Never  Vaping Use   Vaping Use: Never used  Substance Use Topics   Alcohol use: Yes    Comment: occasionally   Drug use: Yes    Types: Marijuana    Comment: pt states occasionally     Allergies   Other and Hydrocodone-acetaminophen   Review of Systems Review of Systems  Respiratory:  Positive for cough.      Physical Exam Triage Vital Signs ED Triage Vitals  Enc Vitals Group     BP 10/11/22 1027 107/68     Pulse Rate 10/11/22 1027 72     Resp 10/11/22 1027 20     Temp 10/11/22 1027 98.2 F (36.8 C)     Temp Source 10/11/22 1027 Oral     SpO2 10/11/22 1027 98 %     Weight --      Height --      Head Circumference --      Peak Flow --      Pain Score 10/11/22 1024 0     Pain Loc --      Pain Edu? --      Excl. in [place]? --    No data found.  Updated Vital Signs BP 107/68 (BP Location: Right Arm)   Pulse 72   Temp 98.2 F (36.8 C) (Oral)   Resp 20   SpO2 98%   Visual Acuity Right Eye Distance:   Left Eye Distance:   Bilateral Distance:    Right Eye Near:   Left Eye Near:    Bilateral Near:     Physical Exam Vitals reviewed.  Constitutional:      General: He is not in acute distress.    Appearance: He is not ill-appearing, toxic-appearing or diaphoretic.  HENT:     Nose: No rhinorrhea.      Comments: There is some mucus in both nostrils.    Mouth/Throat:     Mouth: Mucous membranes are moist.     Pharynx: No oropharyngeal exudate or posterior oropharyngeal erythema.  Eyes:     Extraocular Movements: Extraocular movements intact.     Conjunctiva/sclera: Conjunctivae normal.     Pupils: Pupils are equal, round, and reactive to light.  Cardiovascular:     Rate and Rhythm: Normal rate and regular rhythm.     Heart sounds: No murmur heard. Pulmonary:     Effort: No respiratory distress.     Breath sounds: No stridor. No wheezing, rhonchi or rales.  Musculoskeletal:     Cervical back: Neck supple.  Lymphadenopathy:     Cervical: No cervical adenopathy.  Skin:    Capillary Refill: Capillary refill takes less than 2 seconds.     Coloration: Skin is not jaundiced or pale.  Neurological:     General: No focal deficit present.     Mental Status: He is alert and oriented to person, place, and time.  Psychiatric:        Behavior: Behavior normal.      UC Treatments / Results  Labs (all labs ordered are listed, but only abnormal results are displayed) Labs Reviewed - No data to display  EKG   Radiology DG Chest 2 View  Result Date: 10/11/2022 CLINICAL DATA:  persistent cough, s/p augmentin. bibasilar haziness on CXR 1/29 EXAM: CHEST - 2 VIEW COMPARISON:  10/04/2022 FINDINGS: Cardiomediastinal silhouette and pulmonary vasculature are within normal limits. Lungs are clear. Median sternotomy changes are seen. Prosthetic aortic valve  is present. IMPRESSION: No acute cardiopulmonary process. Electronically Signed   By: Miachel Roux M.D.   On: 10/11/2022 11:24    Procedures Procedures (including critical care time)  Medications Ordered in UC Medications - No data to display  Initial Impression / Assessment and Plan / UC Course  I have reviewed the triage vital signs and the nursing notes.  Pertinent labs & imaging results that were available during my care of the patient  were reviewed by me and considered in my medical decision making (see chart for details).        Chest x-ray is clear now.  I do not think he needs further antibiotic treatment.  Cough suppressant is sent in and I would like him to rinse his nose with saline nose spray. Final Clinical Impressions(s) / UC Diagnoses   Final diagnoses:  Acute cough     Discharge Instructions      Your chest x-ray is clear today.  I do not think you need further antibiotics, since you are feeling better.  Often a cough can last several weeks after having an upper respiratory infection.  Take benzonatate 100 mg, 1 tab every 8 hours as needed for cough.  Rinse your nose with saline nose spray that you buy over-the-counter.     ED Prescriptions     Medication Sig Dispense Auth. Provider   benzonatate (TESSALON) 100 MG capsule Take 1 capsule (100 mg total) by mouth 3 (three) times daily as needed for cough. 21 capsule Barrett Henle, MD      PDMP not reviewed this encounter.   Barrett Henle, MD 10/11/22 1136

## 2022-10-26 ENCOUNTER — Other Ambulatory Visit: Payer: Self-pay | Admitting: Physician Assistant

## 2022-10-26 DIAGNOSIS — J3089 Other allergic rhinitis: Secondary | ICD-10-CM

## 2022-10-28 ENCOUNTER — Ambulatory Visit: Payer: Self-pay | Admitting: *Deleted

## 2022-10-28 DIAGNOSIS — I10 Essential (primary) hypertension: Secondary | ICD-10-CM

## 2022-10-28 DIAGNOSIS — N183 Chronic kidney disease, stage 3 unspecified: Secondary | ICD-10-CM

## 2022-10-28 NOTE — Telephone Encounter (Signed)
Summary: Pt legs swelling and requests call back to discuss Rx for furosemide (LASIX) 40 MG   Pt stated he is experiencing swelling in his legs and requests call back to discuss Rx for furosemide (LASIX) 40 MG tablet. Cb# [id number]           Chief Complaint: bilateral feet and ankle swelling. Requesting to restart medication Symptoms: bilateral feet and ankle swelling, can wear normal size shoes, skin itching, reports Kidney Dr stopped one of his medications for fluid retention and decrease lasix to Monday and Friday. See 06/09/22 OV note nephrology note. Frequency: last week Pertinent Negatives: Patient denies chest pain no difficulty breathing no swelling up to knees  Disposition: []$ ED /[]$ Urgent Care (no appt availability in office) / []$ Appointment(In office/virtual)/ []$  [facility]/ []$ Home Care/ []$ Refused Recommended Disposition /[]$  Mobile Bus/ [x]$  Follow-up with PCP Additional Notes:   Please advise if appt needed. Requesting medication to be added back ,since seeing nephrology. Recommended to contact "kidney dr" as well for recommendations. Please advise. Patient elevated feet and monitoring for worsening sx.       Reason for Disposition  [1] Localized swelling (e.g., small area of puffy or swollen skin) AND [2] itchy  Answer Assessment - Initial Assessment Questions 1. LOCATION: "Which ankle is swollen?" "Where is the swelling?"     bilateral 2. ONSET: "When did the swelling start?"     Last week 3. SWELLING: "How bad is the swelling?" Or, "How large is it?" (e.g., mild, moderate, severe; size of localized swelling)    - NONE: No joint swelling.   - LOCALIZED: Localized; small area of puffy or swollen skin (e.g., insect bite, skin irritation).   - MILD: Joint looks or feels mildly swollen or puffy.   - MODERATE: Swollen; interferes with normal activities (e.g., work or school); decreased range of movement; may be limping.   - SEVERE: Very swollen;  can't move swollen joint at all; limping a lot or unable to walk.     Feet and ankle swelling , can wear normal size shoes and walking not an issue 4. PAIN: "Is there any pain?" If Yes, ask: "How bad is it?" (Scale 1-10; or mild, moderate, severe)   - NONE (0): no pain.   - MILD (1-3): doesn't interfere with normal activities.    - MODERATE (4-7): interferes with normal activities (e.g., work or school) or awakens from sleep, limping.    - SEVERE (8-10): excruciating pain, unable to do any normal activities, unable to walk.      na 5. CAUSE: "What do you think caused the ankle swelling?"     Kidney Dr ordered to take lasix 40 mg Monday and Friday and stopped "another big pill" for water retentio 6. OTHER SYMPTOMS: "Do you have any other symptoms?" (e.g., fever, chest pain, difficulty breathing, calf pain)     Bilateral feet and ankle swelling. Denies to knees or pain but reports itching  7. PREGNANCY: "Is there any chance you are pregnant?" "When was your last menstrual period?"     na  Protocols used: Ankle Swelling-A-AH

## 2022-10-29 MED ORDER — POTASSIUM CHLORIDE CRYS ER 20 MEQ PO TBCR: EXTENDED_RELEASE_TABLET | ORAL | 2 refills | Status: DC

## 2022-10-29 MED ORDER — FUROSEMIDE 40 MG PO TABS: ORAL_TABLET | ORAL | 3 refills | Status: DC

## 2022-10-29 NOTE — Telephone Encounter (Signed)
Routing to PCP for review.

## 2022-10-29 NOTE — Telephone Encounter (Signed)
Spoke with patient . Patient informed of the following per MD   increase the frequency of dosing of furosemide from 2 days a week to 3 days a week.  Furosemide 40 mg on Monday Wednesday and Friday   Take the potassium supplement on the days that he takes the furosemide.  Updated prescription will be sent to his pharmacy. Appointment has been obtained for Tuesday 11/02/2022

## 2022-11-02 ENCOUNTER — Encounter: Payer: Self-pay | Admitting: Internal Medicine

## 2022-11-02 ENCOUNTER — Ambulatory Visit: Payer: PPO | Attending: Internal Medicine | Admitting: Internal Medicine

## 2022-11-02 ENCOUNTER — Telehealth: Payer: Self-pay | Admitting: Internal Medicine

## 2022-11-02 VITALS — BP 107/70 | HR 73 | Temp 98.2°F | Ht 66.0 in | Wt 213.0 lb

## 2022-11-02 DIAGNOSIS — R600 Localized edema: Secondary | ICD-10-CM | POA: Diagnosis not present

## 2022-11-02 DIAGNOSIS — N1832 Chronic kidney disease, stage 3b: Secondary | ICD-10-CM | POA: Diagnosis not present

## 2022-11-02 NOTE — Progress Notes (Signed)
Patient ID: [name], male    DOB: 1961-01-22  MRN: [id number]  CC: Leg Swelling (Swelling of L & R leg X3-4 weeks. /No to fl vax.)   Subjective: [name] is a 62 y.o. male who presents for UC visit His concerns today include:  DM, CKD 3 with macro-albuminuria, HTN, HL, CAD s/p CABG x4, and bicuspid AV s/p AVR 2017, anemia renal ds, TIA, atherosclerosis of intracranial arteries, OSA (not able to tolerate his CPAP, decline referral for [facility] consideration).      Pt called last wk to report increase swelling in his legs x 1 mth No CP/SOB/PND Saw neph Dr. Candiss Jones 09/21/2022.  Patient tells me that he told him to stop the potassium that he was taking 2 times a week with furosemide.  Swelling in the legs started after that. In looking at the nephrologist note, it appears that he was going to start the patient on Kerendia due to albuminuria provided his potassium level was good.  GFR was 43, creatinine 1.76, protein/creatinine ratio 1621.  Patient is not sure whether he got this new medication or not.  He forgot to bring his medicines with him today.  After last visit with me, we had decreased his furosemide from 3 times a week to twice a week.  When he called last week complaining of increased swelling in the legs, I had him go back to taking the furosemide 3 times a week with the potassium.  He reports today that the swelling is improving.  He shows me a picture on his phone of the swelling in the legs 2 weeks ago compared to now.  There is significant improvement.   Patient Active Problem List   Diagnosis Date Noted   Influenza vaccine refused 10/09/2020   OSA on CPAP 02/06/2019   Macroalbuminuric diabetic nephropathy ([place]) 02/06/2019   TIA (transient ischemic attack) 04/10/2018   Colon cancer screening 05/04/2017   CVA (cerebral infarction) 12/11/2015   Acute ischemic VBA thalamic stroke ([place])    Atherosclerosis of native coronary artery of native heart without angina pectoris     H/O heart bypass surgery    Benign essential HTN    Type 2 diabetes mellitus without complication, without long-term current use of insulin ([place])    S/P AVR 11/25/2015   CAD (coronary artery disease)    Chronic periodontitis 11/21/2015   Bicuspid aortic valve    Controlled type 2 diabetes mellitus with diabetic nephropathy, without long-term current use of insulin ([place])    Essential hypertension 02/24/2015   Dyslipidemia 02/24/2015   GERD (gastroesophageal reflux disease) 05/02/2013   Primary gout 05/02/2013   Allergic rhinitis due to allergen 07/09/2010     Current Outpatient Medications on File Prior to Visit  Medication Sig Dispense Refill   allopurinol (ZYLOPRIM) 300 MG tablet Take 1 tablet (300 mg total) by mouth daily. 90 tablet 1   amLODipine (NORVASC) 10 MG tablet Take 1 tablet (10 mg total) by mouth daily. 90 tablet 1   atorvastatin (LIPITOR) 80 MG tablet TAKE 1 TABLET BY MOUTH ONCE DAILY AT 6 IN THE EVENING 90 tablet 1   benzonatate (TESSALON) 100 MG capsule Take 1 capsule (100 mg total) by mouth 3 (three) times daily as needed for cough. 21 capsule 0   carvedilol (COREG) 12.5 MG tablet Take 1 tablet (12.5 mg total) by mouth 2 (two) times daily with a meal. Stop metoprolol 60 tablet 6   ciclopirox (PENLAC) 8 % solution Apply topically at bedtime.  Apply over nail and surrounding skin. Apply daily over previous coat. After seven (7) days, may remove with alcohol and continue cycle. 6.6 mL 0   clopidogrel (PLAVIX) 75 MG tablet Take 1 tablet (75 mg total) by mouth daily. 90 tablet 0   EQ ALLERGY RELIEF 10 MG tablet Take 1 tablet by mouth once daily 30 tablet 2   Erythromycin 2 % PADS Apply 1 application topically daily. 60 each 0   ezetimibe (ZETIA) 10 MG tablet Take 1 tablet by mouth once daily 90 tablet 0   FARXIGA 10 MG TABS tablet Take 10 mg by mouth daily.     fluticasone (FLONASE) 50 MCG/ACT nasal spray Use 2 spray(s) in each nostril once daily 48 g 0   furosemide (LASIX) 40  MG tablet 1 tablet p.o. q. Mondays, Wednesday and Fridays. 36 tablet 3   lisinopril (ZESTRIL) 40 MG tablet Take 1 tablet (40 mg total) by mouth daily. 90 tablet 1   potassium chloride SA (KLOR-CON M) 20 MEQ tablet TAKE 1 TABLET BY MOUTH ON MONDAY, Wed AND FRIDAY. 36 tablet 2   triamcinolone cream (KENALOG) 0.1 % Apply 1 Application topically 2 (two) times daily. 30 g 0   No current facility-administered medications on file prior to visit.    Allergies  Allergen Reactions   Other Anaphylaxis    mushrooms   Hydrocodone-Acetaminophen Itching    Social History   Socioeconomic History   Marital status: Married    Spouse name: Not on file   Number of children: Not on file   Years of education: Not on file   Highest education level: Not on file  Occupational History   Not on file  Tobacco Use   Smoking status: Never   Smokeless tobacco: Never  Vaping Use   Vaping Use: Never used  Substance and Sexual Activity   Alcohol use: Yes    Comment: occasionally   Drug use: Yes    Types: Marijuana    Comment: pt states occasionally   Sexual activity: Yes  Other Topics Concern   Not on file  Social History Narrative   Not on file   Social Determinants of Health   Financial Resource Strain: Not on file  Food Insecurity: Not on file  Transportation Needs: Not on file  Physical Activity: Not on file  Stress: Not on file  Social Connections: Not on file  Intimate Partner Violence: Not on file    Family History  Problem Relation Age of Onset   Diabetes Father    Hypertension Father    Hypertension Mother    Hypertension Sister    Hypertension Brother    Hypertension Brother    Hypertension Brother    Colon cancer Neg Hx    Esophageal cancer Neg Hx    Rectal cancer Neg Hx    Stomach cancer Neg Hx    Colon polyps Neg Hx     Past Surgical History:  Procedure Laterality Date   AORTIC VALVE REPLACEMENT N/A 11/25/2015   Procedure: AORTIC VALVE REPLACEMENT (AVR) USING A 23MM  MAGNA EASE ;  Surgeon: Gaye Pollack, MD;  Location: [facility];  Service: Open Heart Surgery;  Laterality: N/A;   CARDIAC CATHETERIZATION N/A 11/19/2015   Procedure: Right/Left Heart Cath and Coronary Angiography;  Surgeon: Belva Crome, MD;  Location: [facility];  Service: Cardiovascular;  Laterality: N/A;   COLONOSCOPY  2004   CORONARY ARTERY BYPASS GRAFT N/A 11/25/2015   Procedure: CORONARY ARTERY BYPASS GRAFTING (  CABG)x4 LIMA-LAD; SVG-OM; SVG-RCA; SVG-DIAG;  Surgeon: Gaye Pollack, MD;  Location: [facility];  Service: Open Heart Surgery;  Laterality: N/A;   CYST REMOVAL TRUNK N/A 08/24/2016   Procedure: EXCISION OF BACK ABSCESS;  Surgeon: Judeth Horn, MD;  Location: [facility];  Service: General;  Laterality: N/A;   FINGER AMPUTATION Right 1980's   3rd & 4th digits "got them mashed" (01/17/2013)   KNEE ARTHROSCOPY Right 1980's   MULTIPLE EXTRACTIONS WITH ALVEOLOPLASTY Bilateral 11/21/2015   Procedure: Extraction of tooth #'s 2,12 with alveoloplasty and gross debridement of remaining dentition;  Surgeon: Lenn Cal, DDS;  Location: [facility];  Service: Oral Surgery;  Laterality: Bilateral;   TEE WITHOUT CARDIOVERSION N/A 11/20/2015   Procedure: TRANSESOPHAGEAL ECHOCARDIOGRAM (TEE);  Surgeon: Larey Dresser, MD;  Location: [facility];  Service: Cardiovascular;  Laterality: N/A;   TEE WITHOUT CARDIOVERSION N/A 11/25/2015   Procedure: TRANSESOPHAGEAL ECHOCARDIOGRAM (TEE);  Surgeon: Gaye Pollack, MD;  Location: [facility];  Service: Open Heart Surgery;  Laterality: N/A;    ROS: Review of Systems Negative except as stated above  PHYSICAL EXAM: BP 107/70 (BP Location: Left Arm, Patient Position: Sitting, Cuff Size: Normal)   Pulse 73   Temp 98.2 F (36.8 C) (Oral)   Ht '5\' 6"'$  (1.676 m)   Wt 213 lb (96.6 kg)   SpO2 99%   BMI 34.38 kg/m   Physical Exam General appearance - alert, well appearing, older African-American male and in no distress Mental status -patient is alert  and oriented but forgetful. Chest - clear to auscultation, no wheezes, rales or rhonchi, symmetric air entry Heart - RRR 2-3/6 SEM along LSB Extremities - 1+ BL LE edema     Latest Ref Rng & Units 08/12/2022    9:34 AM 12/03/2021    9:10 AM 11/04/2021   10:28 AM  CMP  Glucose 70 - 99 mg/dL 120  113  134   BUN 8 - 27 mg/dL 42  28  33   Creatinine 0.76 - 1.27 mg/dL 1.94  1.71  2.03   Sodium 134 - 144 mmol/L 140  140  140   Potassium 3.5 - 5.2 mmol/L 5.0  4.7  4.5   Chloride 96 - 106 mmol/L 108  109  109   CO2 20 - 29 mmol/L '15  18  17   '$ Calcium 8.6 - 10.2 mg/dL 9.4  9.2  8.9   Total Protein 6.0 - 8.5 g/dL   6.8   Total Bilirubin 0.0 - 1.2 mg/dL   <0.2   Alkaline Phos 44 - 121 IU/L   135   AST 0 - 40 IU/L   40   ALT 0 - 44 IU/L   11    Lipid Panel     Component Value Date/Time   CHOL 171 11/04/2021 1028   TRIG 117 11/04/2021 1028   HDL 60 11/04/2021 1028   CHOLHDL 2.9 11/04/2021 1028   CHOLHDL 5.4 12/11/2015 0609   VLDL 35 12/11/2015 0609   LDLCALC 90 11/04/2021 1028    CBC    Component Value Date/Time   WBC 8.7 08/12/2022 0934   WBC 8.7 04/10/2018 1648   RBC 3.12 (L) 08/12/2022 0934   RBC 4.41 04/10/2018 1648   HGB 10.2 (L) 08/12/2022 0934   HCT 29.2 (L) 08/12/2022 0934   PLT 252 08/12/2022 0934   MCV 94 08/12/2022 0934   MCH 32.7 08/12/2022 0934   MCH 30.6 04/10/2018 1648   MCHC 34.9 08/12/2022  0934   MCHC 33.9 04/10/2018 1648   RDW 15.5 (H) 08/12/2022 0934   LYMPHSABS 1.9 11/04/2021 1028   MONOABS 0.9 04/10/2018 1648   EOSABS 0.2 11/04/2021 1028   BASOSABS 0.1 11/04/2021 1028    ASSESSMENT AND PLAN:  1. Edema of both legs Improved with increased frequency of dosing of furosemide back to 3 days a week.  We will keep him on this dose/frequency.  He is also taking the potassium 3 days a week when he takes the furosemide.  However we may need to stop the potassium if indeed he has [place].  I have written this name down for him and asked that he call us back  when he gets home to let us know whether he has this medication or not.  He promises to do so. - Basic Metabolic Panel  2. Stage 3b chronic kidney disease ([place]) - Basic Metabolic Panel    Patient was given the opportunity to ask questions.  Patient verbalized understanding of the plan and was able to repeat key elements of the plan.   This documentation was completed using Radio producer.  Any transcriptional errors are unintentional.  No orders of the defined types were placed in this encounter.    Requested Prescriptions    No prescriptions requested or ordered in this encounter    No follow-ups on file.  Karle Plumber, MD, FACP

## 2022-11-02 NOTE — Patient Instructions (Signed)
Please check when you get home to see if you have a medication called Carrington Clamp.  Please call us back either way to let us know.

## 2022-11-02 NOTE — Telephone Encounter (Signed)
Copied from [place] [id number]. Topic: General - Other >> Nov 02, 2022  3:21 PM [place] B wrote: Reason for CRM: Patient called in states hasn't received the med, he said was supposed ot be prescribed by Kidney Dr called [facility]. Please call back for further assistance

## 2022-11-03 ENCOUNTER — Telehealth: Payer: Self-pay | Admitting: Internal Medicine

## 2022-11-03 LAB — BASIC METABOLIC PANEL
BUN/Creatinine Ratio: 18 (ref 10–24)
BUN: 31 mg/dL — ABNORMAL HIGH (ref 8–27)
CO2: 20 mmol/L (ref 20–29)
Calcium: 9.1 mg/dL (ref 8.6–10.2)
Chloride: 109 mmol/L — ABNORMAL HIGH (ref 96–106)
Creatinine, Ser: 1.76 mg/dL — ABNORMAL HIGH (ref 0.76–1.27)
Glucose: 113 mg/dL — ABNORMAL HIGH (ref 70–99)
Potassium: 4.9 mmol/L (ref 3.5–5.2)
Sodium: 143 mmol/L (ref 134–144)
eGFR: 43 mL/min/{1.73_m2} — ABNORMAL LOW (ref >59)

## 2022-11-03 NOTE — Telephone Encounter (Signed)
Called & spoke to the patient. Verified name & DOB. Patient stated that he was conatcted by Dr.Johnson earlier today 11/03/2022 and all questions have been resolved. No further assistance need at this time.

## 2022-11-03 NOTE — Telephone Encounter (Signed)
Phone call placed to patient this morning to go over his lab results.  Patient informed that they kidney function is not at 100% but it is stable.  His potassium level is normal.  I inquired whether he has the medication Carrington Clamp.  Patient states that he looked at his bottles when he returned home yesterday and he does not have this medication.  In this case, patient was advised to continue taking the furosemide 3 times a week with potassium supplement.

## 2022-11-10 NOTE — Progress Notes (Signed)
   Please consider outreaching patient to discuss adherence, any barriers to adherence, and consider a referral to me for medication management support.   S:    Patient arrives in good spirits.  Presents for medication management. This patient is appearing on the insurance-provided list for being at risk of failing the adherence measure for Statin Therapy for Patients with Cardiovascular Disease (SPC) and Statin Use in Persons with Diabetes (SUPD) medications this calendar year. Of note, he failed this metric in 2023.   Medication Adherence reported. He tells [place] that he is taking his statin medication consistently. Confirmed with his pharmacy that he filled a 90-day rx for atorvastatin on 10/25/2022.  Medication Adherence Questionnaire (A score of 2 or more points indicates risk for nonadherence)  Do you know what each of your medicines is for? 0 (1 point if no)  Do you ever have trouble remembering to take your medicine? 0 (2 points if yes)  Do you ever not take a medicine because you feel you do not need it?  0 (1 point if yes)  Do you think that any of your medicines is not helping you? 0 (1 point if yes)  Do you have any physical problems such as vision loss that keep you from taking your medicines as prescribed?  0 (2 points if yes)  Do you think any of your medicine is causing a side effect?  0 (1 point if yes)  Do you know the names of ALL of your medicines? 1 (1 point if no)  Do you think that you need ALL of your medicines? 0 (1 point if no)  In the past 6 months, have you missed getting a refill or a new prescription filled on time?  0 (1 point if yes)  How often do you miss taking a dose of medicine?  0 Never (0 points), 1 or 2 times a month (0 points), 1 time a week (2 points), 2 or more times a week (2 points).   TOTAL SCORE 1    O:    A/P: 1. Medication Adherence: Patient has no know adherence challenges. He did fail the SPC/SUPD metric in 2023. -Confirmed that is filling  atorvastatin regularly for 90-day supply.  -Sent new rx for a year supply for the pharmacy to have on file. -Offered updated lipid panel. Pt declined.    2. Medication Reconciliation: medication list reviewed and updated.   Written patient instructions provided.  Total time in face to face counseling 15 minutes.   Follow up in Pharmacist Clinic Visit prn.  Benard Halsted, PharmD, [facility] [place], [place] [phone]

## 2022-11-11 ENCOUNTER — Ambulatory Visit: Payer: PPO | Attending: Internal Medicine | Admitting: Pharmacist

## 2022-11-11 DIAGNOSIS — E785 Hyperlipidemia, unspecified: Secondary | ICD-10-CM

## 2022-11-11 DIAGNOSIS — Z789 Other specified health status: Secondary | ICD-10-CM | POA: Diagnosis not present

## 2022-11-11 DIAGNOSIS — I25810 Atherosclerosis of coronary artery bypass graft(s) without angina pectoris: Secondary | ICD-10-CM

## 2022-11-11 MED ORDER — EZETIMIBE 10 MG PO TABS: 10.0000 mg | ORAL_TABLET | Freq: Every day | ORAL | 3 refills | Status: DC

## 2022-11-11 MED ORDER — ATORVASTATIN CALCIUM 80 MG PO TABS: ORAL_TABLET | ORAL | 3 refills | Status: DC

## 2022-12-06 ENCOUNTER — Telehealth: Payer: Self-pay | Admitting: Internal Medicine

## 2022-12-06 NOTE — Telephone Encounter (Signed)
Contacted [name] to schedule their annual wellness visit. Appointment made for 12/09/22.  [name] AWV direct phone # [phone]

## 2022-12-09 ENCOUNTER — Ambulatory Visit: Payer: PPO | Attending: Internal Medicine

## 2022-12-09 VITALS — Ht 66.0 in | Wt 213.0 lb

## 2022-12-09 DIAGNOSIS — Z0000 Encounter for general adult medical examination without abnormal findings: Secondary | ICD-10-CM

## 2022-12-09 NOTE — Patient Instructions (Signed)
[name] , Thank you for taking time to come for your Medicare Wellness Visit. I appreciate your ongoing commitment to your health goals. Please review the following plan we discussed and let me know if I can assist you in the future.   These are the goals we discussed:  Goals      Patient Stated     12/09/2022, wants to lose weight and get health better        This is a list of the screening recommended for you and due dates:  Health Maintenance  Topic Date Due   Eye exam for diabetics  Never done   COVID-19 Vaccine (4 - 2023-24 season) 05/07/2022   Hemoglobin A1C  02/11/2023   Yearly kidney health urinalysis for diabetes  03/13/2023   Complete foot exam   03/13/2023   Flu Shot  04/07/2023   Yearly kidney function blood test for diabetes  11/03/2023   Medicare Annual Wellness Visit  12/09/2023   Cologuard (Stool DNA test)  04/12/2025   DTaP/Tdap/Td vaccine (2 - Td or Tdap) 08/12/2032   Hepatitis C Screening: USPSTF Recommendation to screen - Ages 80-79 yo.  Completed   HIV Screening  Completed   Zoster (Shingles) Vaccine  Completed   HPV Vaccine  Aged Out    Advanced directives: Advance directive discussed with you today. .  Conditions/risks identified: none  Next appointment: Follow up in one year for your annual wellness visit   Preventive Care 40-64 Years, Male Preventive care refers to lifestyle choices and visits with your health care provider that can promote health and wellness. What does preventive care include? A yearly physical exam. This is also called an annual well check. Dental exams once or twice a year. Routine eye exams. Ask your health care provider how often you should have your eyes checked. Personal lifestyle choices, including: Daily care of your teeth and gums. Regular physical activity. Eating a healthy diet. Avoiding tobacco and drug use. Limiting alcohol use. Practicing safe sex. Taking low-dose aspirin every day starting at age 37. What  happens during an annual well check? The services and screenings done by your health care provider during your annual well check will depend on your age, overall health, lifestyle risk factors, and family history of disease. Counseling  Your health care provider may ask you questions about your: Alcohol use. Tobacco use. Drug use. Emotional well-being. Home and relationship well-being. Sexual activity. Eating habits. Work and work Statistician. Screening  You may have the following tests or measurements: Height, weight, and BMI. Blood pressure. Lipid and cholesterol levels. These may be checked every 5 years, or more frequently if you are over 57 years old. Skin check. Lung cancer screening. You may have this screening every year starting at age 60 if you have a 30-pack-year history of smoking and currently smoke or have quit within the past 15 years. Fecal occult blood test (FOBT) of the stool. You may have this test every year starting at age 27. Flexible sigmoidoscopy or colonoscopy. You may have a sigmoidoscopy every 5 years or a colonoscopy every 10 years starting at age 76. Prostate cancer screening. Recommendations will vary depending on your family history and other risks. Hepatitis C blood test. Hepatitis B blood test. Sexually transmitted disease (STD) testing. Diabetes screening. This is done by checking your blood sugar (glucose) after you have not eaten for a while (fasting). You may have this done every 1-3 years. Discuss your test results, treatment options, and if necessary,  the need for more tests with your health care provider. Vaccines  Your health care provider may recommend certain vaccines, such as: Influenza vaccine. This is recommended every year. Tetanus, diphtheria, and acellular pertussis (Tdap, Td) vaccine. You may need a Td booster every 10 years. Zoster vaccine. You may need this after age 33. Pneumococcal 13-valent conjugate (PCV13) vaccine. You may need  this if you have certain conditions and have not been vaccinated. Pneumococcal polysaccharide (PPSV23) vaccine. You may need one or two doses if you smoke cigarettes or if you have certain conditions. Talk to your health care provider about which screenings and vaccines you need and how often you need them. This information is not intended to replace advice given to you by your health care provider. Make sure you discuss any questions you have with your health care provider. Document Released: 09/19/2015 Document Revised: 05/12/2016 Document Reviewed: 06/24/2015 Elsevier Interactive Patient Education  2017 [employer] Prevention in the Home Falls can cause injuries. They can happen to people of all ages. There are many things you can do to make your home safe and to help prevent falls. What can I do on the outside of my home? Regularly fix the edges of walkways and driveways and fix any cracks. Remove anything that might make you trip as you walk through a door, such as a raised step or threshold. Trim any bushes or trees on the path to your home. Use bright outdoor lighting. Clear any walking paths of anything that might make someone trip, such as rocks or tools. Regularly check to see if handrails are loose or broken. Make sure that both sides of any steps have handrails. Any raised decks and porches should have guardrails on the edges. Have any leaves, snow, or ice cleared regularly. Use sand or salt on walking paths during winter. Clean up any spills in your garage right away. This includes oil or grease spills. What can I do in the bathroom? Use night lights. Install grab bars by the toilet and in the tub and shower. Do not use towel bars as grab bars. Use non-skid mats or decals in the tub or shower. If you need to sit down in the shower, use a plastic, non-slip stool. Keep the floor dry. Clean up any water that spills on the floor as soon as it happens. Remove soap buildup  in the tub or shower regularly. Attach bath mats securely with double-sided non-slip rug tape. Do not have throw rugs and other things on the floor that can make you trip. What can I do in the bedroom? Use night lights. Make sure that you have a light by your bed that is easy to reach. Do not use any sheets or blankets that are too big for your bed. They should not hang down onto the floor. Have a firm chair that has side arms. You can use this for support while you get dressed. Do not have throw rugs and other things on the floor that can make you trip. What can I do in the kitchen? Clean up any spills right away. Avoid walking on wet floors. Keep items that you use a lot in easy-to-reach places. If you need to reach something above you, use a strong step stool that has a grab bar. Keep electrical cords out of the way. Do not use floor polish or wax that makes floors slippery. If you must use wax, use non-skid floor wax. Do not have throw rugs and other  things on the floor that can make you trip. What can I do with my stairs? Do not leave any items on the stairs. Make sure that there are handrails on both sides of the stairs and use them. Fix handrails that are broken or loose. Make sure that handrails are as long as the stairways. Check any carpeting to make sure that it is firmly attached to the stairs. Fix any carpet that is loose or worn. Avoid having throw rugs at the top or bottom of the stairs. If you do have throw rugs, attach them to the floor with carpet tape. Make sure that you have a light switch at the top of the stairs and the bottom of the stairs. If you do not have them, ask someone to add them for you. What else can I do to help prevent falls? Wear shoes that: Do not have high heels. Have rubber bottoms. Are comfortable and fit you well. Are closed at the toe. Do not wear sandals. If you use a stepladder: Make sure that it is fully opened. Do not climb a closed  stepladder. Make sure that both sides of the stepladder are locked into place. Ask someone to hold it for you, if possible. Clearly mark and make sure that you can see: Any grab bars or handrails. First and last steps. Where the edge of each step is. Use tools that help you move around (mobility aids) if they are needed. These include: Canes. Walkers. Scooters. Crutches. Turn on the lights when you go into a dark area. Replace any light bulbs as soon as they burn out. Set up your furniture so you have a clear path. Avoid moving your furniture around. If any of your floors are uneven, fix them. If there are any pets around you, be aware of where they are. Review your medicines with your doctor. Some medicines can make you feel dizzy. This can increase your chance of falling. Ask your doctor what other things that you can do to help prevent falls. This information is not intended to replace advice given to you by your health care provider. Make sure you discuss any questions you have with your health care provider. Document Released: 06/19/2009 Document Revised: 01/29/2016 Document Reviewed: 09/27/2014 Elsevier Interactive Patient Education  2017 [employer].

## 2022-12-09 NOTE — Progress Notes (Signed)
I connected with  [name] on 12/09/22 by a audio enabled telemedicine application and verified that I am speaking with the correct person using two identifiers.  Patient Location: Home  Provider Location: Office/Clinic  I discussed the limitations of evaluation and management by telemedicine. The patient expressed understanding and agreed to proceed.  Subjective:   [name] is a 62 y.o. male who presents for an Initial Medicare Annual Wellness Visit.  Review of Systems     Cardiac Risk Factors include: advanced age (>4men, >77 women);diabetes mellitus;dyslipidemia;hypertension;male gender;obesity (BMI >30kg/m2)     Objective:    Today's Vitals   12/09/22 1356 12/09/22 1357  Weight: 213 lb (96.6 kg)   Height: 5\' 6"  (1.676 m)   PainSc:  10-Worst pain ever   Body mass index is 34.38 kg/m.     12/09/2022    2:03 PM 06/08/2018   11:00 AM 04/10/2018    4:38 PM 07/01/2017    8:22 AM 12/27/2016    7:53 AM 11/03/2016   11:51 AM 08/24/2016   11:39 AM  Advanced Directives  Does Patient Have a Medical Advance Directive? No No No No No No No  Would patient like information on creating a medical advance directive?  Yes (MAU/Ambulatory/Procedural Areas - Information given) No - Patient declined    No - Patient declined    Current Medications (verified) Outpatient Encounter Medications as of 12/09/2022  Medication Sig   allopurinol (ZYLOPRIM) 300 MG tablet Take 1 tablet (300 mg total) by mouth daily.   amLODipine (NORVASC) 10 MG tablet Take 1 tablet (10 mg total) by mouth daily.   atorvastatin (LIPITOR) 80 MG tablet TAKE 1 TABLET BY MOUTH ONCE DAILY AT 6 IN THE EVENING   benzonatate (TESSALON) 100 MG capsule Take 1 capsule (100 mg total) by mouth 3 (three) times daily as needed for cough.   carvedilol (COREG) 12.5 MG tablet Take 1 tablet (12.5 mg total) by mouth 2 (two) times daily with a meal. Stop metoprolol   clopidogrel (PLAVIX) 75 MG tablet Take 1 tablet (75 mg total) by  mouth daily.   EQ ALLERGY RELIEF 10 MG tablet Take 1 tablet by mouth once daily   Erythromycin 2 % PADS Apply 1 application topically daily.   ezetimibe (ZETIA) 10 MG tablet Take 1 tablet (10 mg total) by mouth daily.   fluticasone (FLONASE) 50 MCG/ACT nasal spray Use 2 spray(s) in each nostril once daily   furosemide (LASIX) 40 MG tablet 1 tablet p.o. q. Mondays, Wednesday and Fridays.   lisinopril (ZESTRIL) 40 MG tablet Take 1 tablet (40 mg total) by mouth daily.   potassium chloride SA (KLOR-CON M) 20 MEQ tablet TAKE 1 TABLET BY MOUTH ON MONDAY, Wed AND FRIDAY.   triamcinolone cream (KENALOG) 0.1 % Apply 1 Application topically 2 (two) times daily.   ciclopirox (PENLAC) 8 % solution Apply topically at bedtime. Apply over nail and surrounding skin. Apply daily over previous coat. After seven (7) days, may remove with alcohol and continue cycle. (Patient not taking: Reported on 12/09/2022)   FARXIGA 10 MG TABS tablet Take 10 mg by mouth daily. (Patient not taking: Reported on 12/09/2022)   No facility-administered encounter medications on file as of 12/09/2022.    Allergies (verified) Other and Hydrocodone-acetaminophen   History: Past Medical History:  Diagnosis Date   Allergy    Anemia    Anxiety    Baker's cyst of knee    Blood transfusion without reported diagnosis    as baby  Coronary artery disease    quadruple bypass - March 2016   GERD (gastroesophageal reflux disease)    Gouty arthritis    "real bad" (01/17/2013)   Heart murmur    Hypercholesteremia    Hypertension    Myocardial infarction 2017   Stroke    Type II diabetes mellitus    Past Surgical History:  Procedure Laterality Date   AORTIC VALVE REPLACEMENT N/A 11/25/2015   Procedure: AORTIC VALVE REPLACEMENT (AVR) USING A 23MM MAGNA EASE ;  Surgeon: Gaye Pollack, MD;  Location: [facility];  Service: Open Heart Surgery;  Laterality: N/A;   CARDIAC CATHETERIZATION N/A 11/19/2015   Procedure: Right/Left Heart Cath and  Coronary Angiography;  Surgeon: Belva Crome, MD;  Location: [facility];  Service: Cardiovascular;  Laterality: N/A;   COLONOSCOPY  2004   CORONARY ARTERY BYPASS GRAFT N/A 11/25/2015   Procedure: CORONARY ARTERY BYPASS GRAFTING (CABG)x4 LIMA-LAD; SVG-OM; SVG-RCA; SVG-DIAG;  Surgeon: Gaye Pollack, MD;  Location: [facility];  Service: Open Heart Surgery;  Laterality: N/A;   CYST REMOVAL TRUNK N/A 08/24/2016   Procedure: EXCISION OF BACK ABSCESS;  Surgeon: Judeth Horn, MD;  Location: [facility];  Service: General;  Laterality: N/A;   FINGER AMPUTATION Right 1980's   3rd & 4th digits "got them mashed" (01/17/2013)   KNEE ARTHROSCOPY Right 1980's   MULTIPLE EXTRACTIONS WITH ALVEOLOPLASTY Bilateral 11/21/2015   Procedure: Extraction of tooth #'s 2,12 with alveoloplasty and gross debridement of remaining dentition;  Surgeon: Lenn Cal, DDS;  Location: [facility];  Service: Oral Surgery;  Laterality: Bilateral;   TEE WITHOUT CARDIOVERSION N/A 11/20/2015   Procedure: TRANSESOPHAGEAL ECHOCARDIOGRAM (TEE);  Surgeon: Larey Dresser, MD;  Location: [facility];  Service: Cardiovascular;  Laterality: N/A;   TEE WITHOUT CARDIOVERSION N/A 11/25/2015   Procedure: TRANSESOPHAGEAL ECHOCARDIOGRAM (TEE);  Surgeon: Gaye Pollack, MD;  Location: [facility];  Service: Open Heart Surgery;  Laterality: N/A;   Family History  Problem Relation Age of Onset   Diabetes Father    Hypertension Father    Hypertension Mother    Hypertension Sister    Hypertension Brother    Hypertension Brother    Hypertension Brother    Colon cancer Neg Hx    Esophageal cancer Neg Hx    Rectal cancer Neg Hx    Stomach cancer Neg Hx    Colon polyps Neg Hx    Social History   Socioeconomic History   Marital status: Married    Spouse name: Not on file   Number of children: Not on file   Years of education: Not on file   Highest education level: Not on file  Occupational History   Not on file  Tobacco Use    Smoking status: Never   Smokeless tobacco: Never  Vaping Use   Vaping Use: Never used  Substance and Sexual Activity   Alcohol use: Not Currently    Comment: occasionally   Drug use: Yes    Types: Marijuana    Comment: pt states occasionally   Sexual activity: Yes  Other Topics Concern   Not on file  Social History Narrative   Not on file   Social Determinants of Health   Financial Resource Strain: Low Risk  (12/09/2022)   Overall Financial Resource Strain (CARDIA)    Difficulty of Paying Living Expenses: Not hard at all  Food Insecurity: No Food Insecurity (12/09/2022)   Hunger Vital Sign    Worried About Running Out  of Food in the Last Year: Never true    [place] in the Last Year: Never true  Transportation Needs: No Transportation Needs (12/09/2022)   PRAPARE - Hydrologist (Medical): No    Lack of Transportation (Non-Medical): No  Physical Activity: Inactive (12/09/2022)   Exercise Vital Sign    Days of Exercise per Week: 0 days    Minutes of Exercise per Session: 0 min  Stress: No Stress Concern Present (12/09/2022)   [place]    Feeling of Stress : Not at all  Social Connections: Socially Isolated (11/11/2022)   Social Connection and Isolation Panel [NHANES]    Frequency of Communication with Friends and Family: Never    Frequency of Social Gatherings with Friends and Family: Never    Attends Religious Services: Never    Marine scientist or Organizations: No    Attends Archivist Meetings: Never    Marital Status: Widowed    Tobacco Counseling Counseling given: Not Answered   Clinical Intake:  Pre-visit preparation completed: Yes  Pain : 0-10 Pain Score: 10-Worst pain ever Pain Type: Chronic pain Pain Location: Knee Pain Orientation: Right Pain Descriptors / Indicators: Aching Pain Onset: 1 to 4 weeks ago Pain Frequency: Constant      Nutritional Status: BMI > 30  Obese Nutritional Risks: None Diabetes: Yes  How often do you need to have someone help you when you read instructions, pamphlets, or other written materials from your doctor or pharmacy?: 1 - Never  Diabetic? Yes Nutrition Risk Assessment:  Has the patient had any N/V/D within the last 2 months?  No  Does the patient have any non-healing wounds?  No  Has the patient had any unintentional weight loss or weight gain?  No   Diabetes:  Is the patient diabetic?  Yes  If diabetic, was a CBG obtained today?  No  Did the patient bring in their glucometer from home?  No  How often do you monitor your CBG's? Every other day.   Financial Strains and Diabetes Management:  Are you having any financial strains with the device, your supplies or your medication? Yes .  Does the patient want to be seen by Chronic Care Management for management of their diabetes?  Yes  Would the patient like to be referred to a Nutritionist or for Diabetic Management?  No   Diabetic Exams:  Diabetic Eye Exam: Overdue for diabetic eye exam. Pt has been advised about the importance in completing this exam. Patient advised to call and schedule an eye exam. Diabetic Foot Exam: Completed 03/12/2022   Interpreter Needed?: No  Information entered by :: NAllen LPN   Activities of Daily Living    12/09/2022    2:04 PM  In your present state of health, do you have any difficulty performing the following activities:  Hearing? 0  Vision? 0  Difficulty concentrating or making decisions? 0  Walking or climbing stairs? 1  Comment due to knee  Dressing or bathing? 0  Doing errands, shopping? 0  Preparing Food and eating ? N  Using the Toilet? N  In the past six months, have you accidently leaked urine? N  Do you have problems with loss of bowel control? N  Managing your Medications? N  Managing your Finances? N  Housekeeping or managing your Housekeeping? N    Patient Care  Team: Ladell Pier, MD as PCP -  General (Internal Medicine) Nahser, Wonda Cheng, MD as PCP - Cardiology (Cardiology)  Indicate any recent Medical Services you may have received from other than [facility] providers in the past year (date may be approximate).     Assessment:   This is a routine wellness examination for [place].  Hearing/Vision screen Vision Screening - Comments:: No regular eye exams  Dietary issues and exercise activities discussed: Current Exercise Habits: The patient does not participate in regular exercise at present   Goals Addressed             This Visit's Progress    Patient Stated       12/09/2022, wants to lose weight and get health better       Depression Screen    12/09/2022    2:04 PM 11/02/2022    2:26 PM 11/04/2021    9:36 AM 10/09/2020    2:15 PM 12/06/2018   11:57 AM 06/08/2018   11:00 AM 01/27/2018    3:18 PM  PHQ 2/9 Scores  PHQ - 2 Score 0  0 0 0 1 0  Exception Documentation  Patient refusal         Fall Risk    12/09/2022    2:03 PM 11/02/2022    2:18 PM 09/28/2022   10:10 AM 08/12/2022    8:39 AM 03/12/2022    9:29 AM  Fall Risk   Falls in the past year? 0 0 0 0 0  Number falls in past yr: 0 0 0 0 0  Injury with Fall? 0 0 0 0 0  Risk for fall due to : Medication side effect;Impaired mobility No Fall Risks  No Fall Risks   Follow up Falls prevention discussed;Education provided;Falls evaluation completed        FALL RISK PREVENTION PERTAINING TO THE HOME:  Any stairs in or around the home? Yes  If so, are there any without handrails? No  Home free of loose throw rugs in walkways, pet beds, electrical cords, etc? Yes  Adequate lighting in your home to reduce risk of falls? Yes   ASSISTIVE DEVICES UTILIZED TO PREVENT FALLS:  Life alert? No  Use of a cane, walker or w/c? Yes  Grab bars in the bathroom? No  Shower chair or bench in shower? No  Elevated toilet seat or a handicapped toilet? Yes   TIMED UP AND GO:  Was the test  performed? No .       Cognitive Function:        12/09/2022    2:05 PM  6CIT Screen  What Year? 0 points  What month? 0 points  What time? 0 points  Count back from 20 0 points  Months in reverse 4 points  Repeat phrase 10 points  Total Score 14 points    Immunizations Immunization History  Administered Date(s) Administered   Influenza Whole 07/09/2010   Influenza,inj,Quad PF,6+ Mos 11/03/2016   PFIZER(Purple Top)SARS-COV-2 Vaccination 11/29/2019, 12/24/2019, 09/04/2020   Tdap 08/12/2022   Zoster Recombinat (Shingrix) 02/23/2022, 04/29/2022    TDAP status: Up to date  Flu Vaccine status: Up to date  Pneumococcal vaccine status: Up to date  Covid-19 vaccine status: Completed vaccines  Qualifies for Shingles Vaccine? Yes   Zostavax completed Yes   Shingrix Completed?: Yes  Screening Tests Health Maintenance  Topic Date Due   Medicare Annual Wellness (AWV)  Never done   OPHTHALMOLOGY EXAM  Never done   COVID-19 Vaccine (4 - 2023-24 season) 05/07/2022   HEMOGLOBIN A1C  02/11/2023   Diabetic kidney evaluation - Urine ACR  03/13/2023   FOOT EXAM  03/13/2023   INFLUENZA VACCINE  04/07/2023   Diabetic kidney evaluation - eGFR measurement  11/03/2023   Fecal DNA (Cologuard)  04/12/2025   DTaP/Tdap/Td (2 - Td or Tdap) 08/12/2032   Hepatitis C Screening  Completed   HIV Screening  Completed   Zoster Vaccines- Shingrix  Completed   HPV VACCINES  Aged Out    Health Maintenance  Health Maintenance Due  Topic Date Due   Medicare Annual Wellness (AWV)  Never done   OPHTHALMOLOGY EXAM  Never done   COVID-19 Vaccine (4 - 2023-24 season) 05/07/2022    Colorectal cancer screening: Type of screening: Cologuard. Completed 04/12/2022. Repeat every 3 years  Lung Cancer Screening: (Low Dose CT Chest recommended if Age 55-80 years, 30 pack-year currently smoking OR have quit w/in 15years.) does not qualify.   Lung Cancer Screening Referral: no  Additional  Screening:  Hepatitis C Screening: does qualify; Completed 01/27/2018  Vision Screening: Recommended annual ophthalmology exams for early detection of glaucoma and other disorders of the eye. Is the patient up to date with their annual eye exam?  No  Who is the provider or what is the name of the office in which the patient attends annual eye exams? none If pt is not established with a provider, would they like to be referred to a provider to establish care? No .   Dental Screening: Recommended annual dental exams for proper oral hygiene  Community Resource Referral / Chronic Care Management: CRR required this visit?  No   CCM required this visit?  No      Plan:     I have personally reviewed and noted the following in the patient's chart:   Medical and social history Use of alcohol, tobacco or illicit drugs  Current medications and supplements including opioid prescriptions. Patient is not currently taking opioid prescriptions. Functional ability and status Nutritional status Physical activity Advanced directives List of other physicians Hospitalizations, surgeries, and ER visits in previous 12 months Vitals Screenings to include cognitive, depression, and falls Referrals and appointments  In addition, I have reviewed and discussed with patient certain preventive protocols, quality metrics, and best practice recommendations. A written personalized care plan for preventive services as well as general preventive health recommendations were provided to patient.     Kellie Simmering, LPN   [id number]   Nurse Notes: none  Due to this being a virtual visit, the after visit summary with patients personalized plan was offered to patient via mail or my-chart.  to pick up at office at next visit

## 2022-12-13 ENCOUNTER — Encounter: Payer: Self-pay | Admitting: Internal Medicine

## 2022-12-13 ENCOUNTER — Ambulatory Visit: Payer: PPO | Attending: Internal Medicine | Admitting: Internal Medicine

## 2022-12-13 VITALS — BP 110/60 | HR 76 | Temp 98.7°F | Ht 66.0 in | Wt 219.0 lb

## 2022-12-13 DIAGNOSIS — Z952 Presence of prosthetic heart valve: Secondary | ICD-10-CM

## 2022-12-13 DIAGNOSIS — N1832 Chronic kidney disease, stage 3b: Secondary | ICD-10-CM | POA: Diagnosis not present

## 2022-12-13 DIAGNOSIS — I25810 Atherosclerosis of coronary artery bypass graft(s) without angina pectoris: Secondary | ICD-10-CM

## 2022-12-13 DIAGNOSIS — E669 Obesity, unspecified: Secondary | ICD-10-CM | POA: Diagnosis not present

## 2022-12-13 DIAGNOSIS — R600 Localized edema: Secondary | ICD-10-CM

## 2022-12-13 DIAGNOSIS — D649 Anemia, unspecified: Secondary | ICD-10-CM | POA: Diagnosis not present

## 2022-12-13 DIAGNOSIS — M25561 Pain in right knee: Secondary | ICD-10-CM | POA: Diagnosis not present

## 2022-12-13 DIAGNOSIS — E1169 Type 2 diabetes mellitus with other specified complication: Secondary | ICD-10-CM | POA: Diagnosis not present

## 2022-12-13 DIAGNOSIS — E1159 Type 2 diabetes mellitus with other circulatory complications: Secondary | ICD-10-CM

## 2022-12-13 DIAGNOSIS — I152 Hypertension secondary to endocrine disorders: Secondary | ICD-10-CM | POA: Diagnosis not present

## 2022-12-13 LAB — POCT GLYCOSYLATED HEMOGLOBIN (HGB A1C): HbA1c, POC (controlled diabetic range): 5.9 % (ref 0.0–7.0)

## 2022-12-13 LAB — GLUCOSE, POCT (MANUAL RESULT ENTRY): POC Glucose: 139 mg/dl — AB (ref 70–99)

## 2022-12-13 MED ORDER — PREDNISONE 20 MG PO TABS: ORAL_TABLET | ORAL | 0 refills | Status: DC

## 2022-12-13 MED ORDER — FUROSEMIDE 40 MG PO TABS: ORAL_TABLET | ORAL | 3 refills | Status: DC

## 2022-12-13 MED ORDER — POTASSIUM CHLORIDE CRYS ER 20 MEQ PO TBCR
EXTENDED_RELEASE_TABLET | ORAL | 2 refills | Status: DC
Start: 2022-12-13 — End: 2023-04-20

## 2022-12-13 MED ORDER — ALLOPURINOL 300 MG PO TABS: 150.0000 mg | ORAL_TABLET | Freq: Every day | ORAL | 1 refills | Status: DC

## 2022-12-13 NOTE — Progress Notes (Signed)
Patient ID: [name], male    DOB: [birth year]  MRN: [id number]  CC: Diabetes (DM & chronic conditions f/u. )   Subjective: [name] is a 62 y.o. male who presents for chronic disease management His concerns today include:  DM, CKD 3 with macro-albuminuria, HTN, HL, CAD s/p CABG x4, and bicuspid AV s/p AVR 2017, anemia renal ds, TIA, atherosclerosis of intracranial arteries, OSA (not able to tolerate his CPAP, decline referral for [facility] consideration).     C/o gout flare RT knee x 3 days, could barely walk.  Knee was swollen, no redness.  No fever.  Has cane with him today Supposed to be on Allopurinol 300 mg daily but reports he takes it only during flare ups.  States he was told that it can mess up his kidneys. Wants Indocin   DM/obesity Results for orders placed or performed in visit on 12/13/22  POCT glucose (manual entry)  Result Value Ref Range   POC Glucose 139 (A) 70 - 99 mg/dl  POCT glycosylated hemoglobin (Hb A1C)  Result Value Ref Range   Hemoglobin A1C     HbA1c POC (<> result, manual entry)     HbA1c, POC (prediabetic range)     HbA1c, POC (controlled diabetic range) 5.9 0.0 - 7.0 %  Checks BS every other day.  Does not recall readings and does not have glucometer with him Currently on Farxiga 10 mg daily. Doing well with eating habits. No showed appt with Dr. Dione Booze 08/12/2022  HTN/CAD/hx AVR: Currently on lisinopril 40 mg daily, Coreg 12.5 mg twice a day, Norvasc 10 mg daily, furosemide 40 mg 3 times a week with potassium 20 mEq 3 times a week, atorvastatin 80 mg daily, Zetia 10 mg daily and clopidogrel 75 mg daily. No chest pains, shortness of breath or lower extremity edema.  CKD 3: Followed by Dr. Thedore Mins.  Last GFR was 43 with range being 30s to 40s. Last H&H was 10.2/29.2.  This was a drop from 13/39.7. No blood in stools.   Lacks energy.  Up-to-date with colon cancer screening.  Cologuard done 04/2022. Iron studies done in December consistent with  anemia of chronic disease. Lab Results  Component Value Date   IRON 83 08/12/2022   TIBC 264 08/12/2022   FERRITIN 537 (H) 08/12/2022  Percent saturation 31%   Patient Active Problem List   Diagnosis Date Noted   Influenza vaccine refused 10/09/2020   OSA on CPAP 02/06/2019   Macroalbuminuric diabetic nephropathy 02/06/2019   TIA (transient ischemic attack) 04/10/2018   Colon cancer screening 05/04/2017   CVA (cerebral infarction) 12/11/2015   Acute ischemic VBA thalamic stroke    Atherosclerosis of native coronary artery of native heart without angina pectoris    H/O heart bypass surgery    Benign essential HTN    Type 2 diabetes mellitus without complication, without long-term current use of insulin    S/P AVR 11/25/2015   CAD (coronary artery disease)    Chronic periodontitis 11/21/2015   Bicuspid aortic valve    Controlled type 2 diabetes mellitus with diabetic nephropathy, without long-term current use of insulin    Essential hypertension 02/24/2015   Dyslipidemia 02/24/2015   GERD (gastroesophageal reflux disease) 05/02/2013   Primary gout 05/02/2013   Allergic rhinitis due to allergen 07/09/2010     Current Outpatient Medications on File Prior to Visit  Medication Sig Dispense Refill   amLODipine (NORVASC) 10 MG tablet Take 1 tablet (10 mg total) by mouth  daily. 90 tablet 1   atorvastatin (LIPITOR) 80 MG tablet TAKE 1 TABLET BY MOUTH ONCE DAILY AT 6 IN THE EVENING 90 tablet 3   carvedilol (COREG) 12.5 MG tablet Take 1 tablet (12.5 mg total) by mouth 2 (two) times daily with a meal. Stop metoprolol 60 tablet 6   clopidogrel (PLAVIX) 75 MG tablet Take 1 tablet (75 mg total) by mouth daily. 90 tablet 0   EQ ALLERGY RELIEF 10 MG tablet Take 1 tablet by mouth once daily 30 tablet 2   ezetimibe (ZETIA) 10 MG tablet Take 1 tablet (10 mg total) by mouth daily. 90 tablet 3   FARXIGA 10 MG TABS tablet Take 10 mg by mouth daily.     fluticasone (FLONASE) 50 MCG/ACT nasal spray  Use 2 spray(s) in each nostril once daily 48 g 0   lisinopril (ZESTRIL) 40 MG tablet Take 1 tablet (40 mg total) by mouth daily. 90 tablet 1   No current facility-administered medications on file prior to visit.    Allergies  Allergen Reactions   Other Anaphylaxis    mushrooms   Hydrocodone-Acetaminophen Itching    Social History   Socioeconomic History   Marital status: Married    Spouse name: Not on file   Number of children: Not on file   Years of education: Not on file   Highest education level: Not on file  Occupational History   Not on file  Tobacco Use   Smoking status: Never   Smokeless tobacco: Never  Vaping Use   Vaping Use: Never used  Substance and Sexual Activity   Alcohol use: Not Currently    Comment: occasionally   Drug use: Yes    Types: Marijuana    Comment: pt states occasionally   Sexual activity: Yes  Other Topics Concern   Not on file  Social History Narrative   Not on file   Social Determinants of Health   Financial Resource Strain: Low Risk  (12/09/2022)   Overall Financial Resource Strain (CARDIA)    Difficulty of Paying Living Expenses: Not hard at all  Food Insecurity: No Food Insecurity (12/09/2022)   Hunger Vital Sign    Worried About Running Out of Food in the Last Year: Never true    Ran Out of Food in the Last Year: Never true  Transportation Needs: No Transportation Needs (12/09/2022)   PRAPARE - Administrator, Civil Service (Medical): No    Lack of Transportation (Non-Medical): No  Physical Activity: Inactive (12/09/2022)   Exercise Vital Sign    Days of Exercise per Week: 0 days    Minutes of Exercise per Session: 0 min  Stress: No Stress Concern Present (12/09/2022)   [employer] of Occupational Health - Occupational Stress Questionnaire    Feeling of Stress : Not at all  Social Connections: Socially Isolated (11/11/2022)   Social Connection and Isolation Panel [NHANES]    Frequency of Communication with Friends  and Family: Never    Frequency of Social Gatherings with Friends and Family: Never    Attends Religious Services: Never    Database administrator or Organizations: No    Attends Banker Meetings: Never    Marital Status: Widowed  Catering manager Violence: Not on file    Family History  Problem Relation Age of Onset   Diabetes Father    Hypertension Father    Hypertension Mother    Hypertension Sister    Hypertension Brother  Hypertension Brother    Hypertension Brother    Colon cancer Neg Hx    Esophageal cancer Neg Hx    Rectal cancer Neg Hx    Stomach cancer Neg Hx    Colon polyps Neg Hx     Past Surgical History:  Procedure Laterality Date   AORTIC VALVE REPLACEMENT N/A 11/25/2015   Procedure: AORTIC VALVE REPLACEMENT (AVR) USING A MAGNA EASE ;  Surgeon: Alleen Borne, MD;  Location: [facility] OR;  Service: Open Heart Surgery;  Laterality: N/A;   CARDIAC CATHETERIZATION N/A 11/19/2015   Procedure: Right/Left Heart Cath and Coronary Angiography;  Surgeon: Lyn Records, MD;  Location: [facility] INVASIVE CV LAB;  Service: Cardiovascular;  Laterality: N/A;   COLONOSCOPY  2004   CORONARY ARTERY BYPASS GRAFT N/A 11/25/2015   Procedure: CORONARY ARTERY BYPASS GRAFTING (CABG)x4 LIMA-LAD; SVG-OM; SVG-RCA; SVG-DIAG;  Surgeon: Alleen Borne, MD;  Location: [facility] OR;  Service: Open Heart Surgery;  Laterality: N/A;   CYST REMOVAL TRUNK N/A 08/24/2016   Procedure: EXCISION OF BACK ABSCESS;  Surgeon: Jimmye Norman, MD;  Location: [facility];  Service: General;  Laterality: N/A;   FINGER AMPUTATION Right 1980's   3rd & 4th digits "got them mashed" (01/17/2013)   KNEE ARTHROSCOPY Right 1980's   MULTIPLE EXTRACTIONS WITH ALVEOLOPLASTY Bilateral 11/21/2015   Procedure: Extraction of tooth #'s 2,12 with alveoloplasty and gross debridement of remaining dentition;  Surgeon: Charlynne Pander, DDS;  Location: [facility] OR;  Service: Oral Surgery;  Laterality: Bilateral;   TEE WITHOUT  CARDIOVERSION N/A 11/20/2015   Procedure: TRANSESOPHAGEAL ECHOCARDIOGRAM (TEE);  Surgeon: Laurey Morale, MD;  Location: [facility] ENDOSCOPY;  Service: Cardiovascular;  Laterality: N/A;   TEE WITHOUT CARDIOVERSION N/A 11/25/2015   Procedure: TRANSESOPHAGEAL ECHOCARDIOGRAM (TEE);  Surgeon: Alleen Borne, MD;  Location: [facility] OR;  Service: Open Heart Surgery;  Laterality: N/A;    ROS: Review of Systems Negative except as stated above  PHYSICAL EXAM: BP 110/60   Pulse 76   Temp 98.7 F (37.1 C) (Oral)   Ht 5\' 6"  (1.676 m)   Wt 219 lb (99.3 kg)   SpO2 100%   BMI 35.35 kg/m   Wt Readings from Last 3 Encounters:  12/13/22 219 lb (99.3 kg)  12/09/22 213 lb (96.6 kg)  11/02/22 213 lb (96.6 kg)    Physical Exam   General appearance - alert, well appearing, older African-American male and in no distress Mental status - normal mood, behavior, speech, dress, motor activity, and thought processes Neck - supple, no significant adenopathy Chest - clear to auscultation, no wheezes, rales or rhonchi, symmetric air entry Heart -regular rate and rhythm, 3/6 systolic ejection murmur heard best at the left upper sternal border. Musculoskeletal -right knee: Mild to moderate edema.  Mild point tenderness on the medial and lateral joint lines.  Mild discomfort with passive range of motion. Extremities -1+ bilateral lower extremity edema right greater than left.    Latest Ref Rng & Units 11/02/2022    2:54 PM 08/12/2022    9:34 AM 12/03/2021    9:10 AM  CMP  Glucose 70 - 99 mg/dL 300  762  263   BUN 8 - 27 mg/dL 31  42  28   Creatinine 0.76 - 1.27 mg/dL 3.35  4.56  2.56   Sodium 134 - 144 mmol/L 143  140  140   Potassium 3.5 - 5.2 mmol/L 4.9  5.0  4.7   Chloride 96 - 106 mmol/L 109  108  109   CO2 20 - 29 mmol/L 20  15  18    Calcium 8.6 - 10.2 mg/dL 9.1  9.4  9.2    Lipid Panel     Component Value Date/Time   CHOL 171 11/04/2021 1028   TRIG 117 11/04/2021 1028   HDL 60 11/04/2021 1028   CHOLHDL  2.9 11/04/2021 1028   CHOLHDL 5.4 12/11/2015 0609   VLDL 35 12/11/2015 0609   LDLCALC 90 11/04/2021 1028    CBC    Component Value Date/Time   WBC 8.7 08/12/2022 0934   WBC 8.7 04/10/2018 1648   RBC 3.12 (L) 08/12/2022 0934   RBC 4.41 04/10/2018 1648   HGB 10.2 (L) 08/12/2022 0934   HCT 29.2 (L) 08/12/2022 0934   PLT 252 08/12/2022 0934   MCV 94 08/12/2022 0934   MCH 32.7 08/12/2022 0934   MCH 30.6 04/10/2018 1648   MCHC 34.9 08/12/2022 0934   MCHC 33.9 04/10/2018 1648   RDW 15.5 (H) 08/12/2022 0934   LYMPHSABS 1.9 11/04/2021 1028   MONOABS 0.9 04/10/2018 1648   EOSABS 0.2 11/04/2021 1028   BASOSABS 0.1 11/04/2021 1028    ASSESSMENT AND PLAN: 1. Type 2 diabetes mellitus with obesity At goal.  Continue Farxiga. Encourage healthy eating habits. - POCT glucose (manual entry) - POCT glycosylated hemoglobin (Hb A1C) - Ambulatory referral to Ophthalmology  2. Coronary artery disease involving coronary bypass graft of native heart without angina pectoris Stable. Continue atorvastatin, Zetia, Plavix and carvedilol - Lipid panel; Future - Ambulatory referral to Cardiology  3. Hypertension associated with diabetes Continue amlodipine 10 mg daily, carvedilol 12.5 mg twice a day, senna Pearl 40 mg daily  4. Acute pain of right knee Gout versus OA. Advised against Indocin given his kidney function.  Will give several days of prednisone instead. Advised to take the allopurinol daily.  Since he has not been taking it consistently, instead of 300 mg daily, we will have him cut it in half and do 150 mg daily.  Report any rash. - Uric Acid; Future - predniSONE (DELTASONE) 20 MG tablet; 1 tab PO daily x 3 days then 1/2 tab PO daily x 4 days  Dispense: 5 tablet; Refill: 0 - Ambulatory referral to Orthopedics - DG Knee Complete 4 Views Right; Future  5. Stage 3b chronic kidney disease Stable.  Continue to monitor.  No NSAIDs.  6. Normocytic anemia Recheck CBC to see if it has  remained stable since it was last checked in December. - CBC  7. H/O aortic valve replacement Due for follow-up with his cardiologist. - Ambulatory referral to Cardiology  8. Edema of both lower legs Check BNP.  Increase furosemide to 4 times weekly.  Increase potassium to 4 times weekly.  Get him back in with his cardiologist.  Overdue for echo. - Brain natriuretic peptide; Future - furosemide (LASIX) 40 MG tablet; 1 tablet p.o. q. Mondays, Wednesday and Fridays.  Dispense: 48 tablet; Refill: 3 - potassium chloride SA (KLOR-CON M) 20 MEQ tablet; TAKE 1 TABLET BY MOUTH ON MONDAY, Wed, FRIDAY and Saturdays.  Dispense: 48 tablet; Refill: 2    Patient was given the opportunity to ask questions.  Patient verbalized understanding of the plan and was able to repeat key elements of the plan.   This documentation was completed using Paediatric nurseDragon voice recognition technology.  Any transcriptional errors are unintentional.  Orders Placed This Encounter  Procedures   DG Knee Complete 4 Views Right   CBC  Lipid panel   Uric Acid   Brain natriuretic peptide   Ambulatory referral to Ophthalmology   Ambulatory referral to Orthopedics   Ambulatory referral to Cardiology   POCT glucose (manual entry)   POCT glycosylated hemoglobin (Hb A1C)     Requested Prescriptions   Signed Prescriptions Disp Refills   predniSONE (DELTASONE) 20 MG tablet 5 tablet 0    Sig: 1 tab PO daily x 3 days then 1/2 tab PO daily x 4 days   furosemide (LASIX) 40 MG tablet 48 tablet 3    Sig: 1 tablet p.o. q. Mondays, Wednesday and Fridays.   potassium chloride SA (KLOR-CON M) 20 MEQ tablet 48 tablet 2    Sig: TAKE 1 TABLET BY MOUTH ON MONDAY, Wed, FRIDAY and Saturdays.   allopurinol (ZYLOPRIM) 300 MG tablet 90 tablet 1    Sig: Take 0.5 tablets (150 mg total) by mouth daily.    Return in about 4 months (around 04/14/2023).  Jonah Blue, MD, FACP

## 2022-12-13 NOTE — Patient Instructions (Signed)
I have sent prescription for prednisone to your pharmacy for the knee. Please go to radiology at [facility] imaging to get x-rays of the right knee. We have referred you to orthopedics for the knee.  You should take half a tablet of allopurinol every day.  Increase furosemide to 1 tablet 4 days a week.  Increase potassium to 1 tablet 4 days a week.  Please remember to return to the lab this week or next week to have your blood test done.

## 2022-12-16 ENCOUNTER — Ambulatory Visit (INDEPENDENT_AMBULATORY_CARE_PROVIDER_SITE_OTHER): Payer: PPO | Admitting: Orthopaedic Surgery

## 2022-12-16 ENCOUNTER — Other Ambulatory Visit (INDEPENDENT_AMBULATORY_CARE_PROVIDER_SITE_OTHER): Payer: PPO

## 2022-12-16 DIAGNOSIS — M25561 Pain in right knee: Secondary | ICD-10-CM | POA: Diagnosis not present

## 2022-12-16 DIAGNOSIS — G8929 Other chronic pain: Secondary | ICD-10-CM

## 2022-12-16 MED ORDER — PREDNISONE 10 MG (21) PO TBPK
ORAL_TABLET | ORAL | 3 refills | Status: DC
Start: 2022-12-16 — End: 2023-02-26

## 2022-12-16 MED ORDER — COLCHICINE 0.6 MG PO TABS
0.6000 mg | ORAL_TABLET | Freq: Two times a day (BID) | ORAL | 0 refills | Status: DC | PRN
Start: 2022-12-16 — End: 2023-04-11

## 2022-12-16 NOTE — Progress Notes (Signed)
Office Visit Note   Patient: [name]           Date of Birth: 10/15/1960           MRN: [id number] Visit Date: 12/16/2022              Requested by: Marcine Matar, MD [address] [place],  [place] [address] PCP: Marcine Matar, MD   Assessment & Plan: Visit Diagnoses:  1. Chronic pain of right knee     Plan: Impression is 62 year old gentleman with likely right knee gout flare.  Patient declined knee aspiration and injection.  Patient will continue to take his gout medications.  Follow-Up Instructions: No follow-ups on file.   Orders:  Orders Placed This Encounter  Procedures   XR Knee 1-2 Views Right   Meds ordered this encounter  Medications   colchicine 0.6 MG tablet    Sig: Take 1 tablet (0.6 mg total) by mouth 2 (two) times daily as needed.    Dispense:  20 tablet    Refill:  0   predniSONE (STERAPRED UNI-PAK 21 TAB) 10 MG (21) TBPK tablet    Sig: Take as directed    Dispense:  21 tablet    Refill:  3      Procedures: No procedures performed   Clinical Data: No additional findings.   Subjective: No chief complaint on file.   HPI  Patient is a pleasant 62 year old gentleman who comes in for right knee pain that he suspects is a gout attack.  He has had gout attacks in the past in the right knee.  Denies any injuries or constitutional symptoms.  Walks with a cane. States this feels exactly like his previous gout attacks. He's had numerous gout attacks in the knee.    Review of Systems  Constitutional: Negative.   HENT: Negative.    Eyes: Negative.   Respiratory: Negative.    Cardiovascular: Negative.   Gastrointestinal: Negative.   Endocrine: Negative.   Genitourinary: Negative.   Skin: Negative.   Allergic/Immunologic: Negative.   Neurological: Negative.   Hematological: Negative.   Psychiatric/Behavioral: Negative.    All other systems reviewed and are negative.    Objective: Vital Signs: There were no vitals  taken for this visit.  Physical Exam Vitals and nursing note reviewed.  Constitutional:      Appearance: He is well-developed.  HENT:     Head: Normocephalic and atraumatic.  Eyes:     Pupils: Pupils are equal, round, and reactive to light.  Pulmonary:     Effort: Pulmonary effort is normal.  Abdominal:     Palpations: Abdomen is soft.  Musculoskeletal:        General: Normal range of motion.     Cervical back: Neck supple.  Skin:    General: Skin is warm.  Neurological:     Mental Status: He is alert and oriented to person, place, and time.  Psychiatric:        Behavior: Behavior normal.        Thought Content: Thought content normal.        Judgment: Judgment normal.     Ortho Exam  Examination right knee shows large joint effusion.  Slight warmth.  He can tolerate gentle range of motion without any significant pain.  No neurovascular compromise.  Specialty Comments:  No specialty comments available.  Imaging: XR Knee 1-2 Views Right  Result Date: 12/16/2022 2 view x-rays show mild to moderate degenerative  changes with joint space narrowing of the lateral and patellofemoral compartments.    PMFS History: Patient Active Problem List   Diagnosis Date Noted   Influenza vaccine refused 10/09/2020   OSA on CPAP 02/06/2019   Macroalbuminuric diabetic nephropathy 02/06/2019   TIA (transient ischemic attack) 04/10/2018   Colon cancer screening 05/04/2017   CVA (cerebral infarction) 12/11/2015   Acute ischemic VBA thalamic stroke    Atherosclerosis of native coronary artery of native heart without angina pectoris    H/O heart bypass surgery    Benign essential HTN    Type 2 diabetes mellitus without complication, without long-term current use of insulin    S/P AVR 11/25/2015   CAD (coronary artery disease)    Chronic periodontitis 11/21/2015   Bicuspid aortic valve    Controlled type 2 diabetes mellitus with diabetic nephropathy, without long-term current use of  insulin    Essential hypertension 02/24/2015   Dyslipidemia 02/24/2015   GERD (gastroesophageal reflux disease) 05/02/2013   Primary gout 05/02/2013   Allergic rhinitis due to allergen 07/09/2010   Past Medical History:  Diagnosis Date   Allergy    Anemia    Anxiety    Baker's cyst of knee    Blood transfusion without reported diagnosis    as baby    Coronary artery disease    quadruple bypass - March 2016   GERD (gastroesophageal reflux disease)    Gouty arthritis    "real bad" (01/17/2013)   Heart murmur    Hypercholesteremia    Hypertension    Myocardial infarction 2017   Stroke    Type II diabetes mellitus     Family History  Problem Relation Age of Onset   Diabetes Father    Hypertension Father    Hypertension Mother    Hypertension Sister    Hypertension Brother    Hypertension Brother    Hypertension Brother    Colon cancer Neg Hx    Esophageal cancer Neg Hx    Rectal cancer Neg Hx    Stomach cancer Neg Hx    Colon polyps Neg Hx     Past Surgical History:  Procedure Laterality Date   AORTIC VALVE REPLACEMENT N/A 11/25/2015   Procedure: AORTIC VALVE REPLACEMENT (AVR) USING A MAGNA EASE ;  Surgeon: Alleen Borne, MD;  Location: [facility] OR;  Service: Open Heart Surgery;  Laterality: N/A;   CARDIAC CATHETERIZATION N/A 11/19/2015   Procedure: Right/Left Heart Cath and Coronary Angiography;  Surgeon: Lyn Records, MD;  Location[facility] INVASIVE CV LAB;  Service: Cardiovascular;  Laterality: N/A;   COLONOSCOPY  2004   CORONARY ARTERY BYPASS GRAFT N/A 11/25/2015   Procedure: CORONARY ARTERY BYPASS GRAFTING (CABG)x4 LIMA-LAD; SVG-OM; SVG-RCA; SVG-DIAG;  Surgeon: Alleen Borne, MD;  Location: [facility] OR;  Service: Open Heart Surgery;  Laterality: N/A;   CYST REMOVAL TRUNK N/A 08/24/2016   Procedure: EXCISION OF BACK ABSCESS;  Surgeon: Jimmye Norman, MD;  Location: [facility];  Service: General;  Laterality: N/A;   FINGER AMPUTATION Right 1980's   3rd & 4th digits  "got them mashed" (01/17/2013)   KNEE ARTHROSCOPY Right 1980's   MULTIPLE EXTRACTIONS WITH ALVEOLOPLASTY Bilateral 11/21/2015   Procedure: Extraction of tooth #'s 2,12 with alveoloplasty and gross debridement of remaining dentition;  Surgeon: Charlynne Pander, DDS;  Location: [facility] OR;  Service: Oral Surgery;  Laterality: Bilateral;   TEE WITHOUT CARDIOVERSION N/A 11/20/2015   Procedure: TRANSESOPHAGEAL ECHOCARDIOGRAM (TEE);  Surgeon: Laurey Morale, MD;  Location: [facility] ENDOSCOPY;  Service: Cardiovascular;  Laterality: N/A;   TEE WITHOUT CARDIOVERSION N/A 11/25/2015   Procedure: TRANSESOPHAGEAL ECHOCARDIOGRAM (TEE);  Surgeon: Alleen BorneBryan K Bartle, MD;  [facility];  Service: Open Heart Surgery;  Laterality: N/A;   Social History   Occupational History   Not on file  Tobacco Use   Smoking status: Never   Smokeless tobacco: Never  Vaping Use   Vaping Use: Never used  Substance and Sexual Activity   Alcohol use: Not Currently    Comment: occasionally   Drug use: Yes    Types: Marijuana    Comment: pt states occasionally   Sexual activity: Yes

## 2022-12-17 ENCOUNTER — Telehealth: Payer: Self-pay

## 2022-12-17 NOTE — Telephone Encounter (Signed)
Ok to take up to 10 days at most.  Thanks.

## 2022-12-17 NOTE — Telephone Encounter (Signed)
Called and gave verbal to [facility]

## 2022-12-26 NOTE — Progress Notes (Unsigned)
Office Visit    Patient Name: [name] Date of Encounter: 12/28/2022  Primary Care Provider:  Marcine Matar, MD Primary Cardiologist:  Dwayne Miss, MD Primary Electrophysiologist: None   Past Medical History    Past Medical History:  Diagnosis Date   Allergy    Anemia    Anxiety    Baker's cyst of knee    Blood transfusion without reported diagnosis    as baby    Coronary artery disease    quadruple bypass - March 2016   GERD (gastroesophageal reflux disease)    Gouty arthritis    "real bad" (01/17/2013)   Heart murmur    Hypercholesteremia    Hypertension    Myocardial infarction 2017   Stroke    Type II diabetes mellitus    Past Surgical History:  Procedure Laterality Date   AORTIC VALVE REPLACEMENT N/A 11/25/2015   Procedure: AORTIC VALVE REPLACEMENT (AVR) USING A MAGNA EASE ;  Surgeon: Dwayne Borne, MD;  Location: [facility] OR;  Service: Open Heart Surgery;  Laterality: N/A;   CARDIAC CATHETERIZATION N/A 11/19/2015   Procedure: Right/Left Heart Cath and Coronary Angiography;  Surgeon: Dwayne Records, MD;  Location: [facility] INVASIVE CV LAB;  Service: Cardiovascular;  Laterality: N/A;   COLONOSCOPY  2004   CORONARY ARTERY BYPASS GRAFT N/A 11/25/2015   Procedure: CORONARY ARTERY BYPASS GRAFTING (CABG)x4 LIMA-LAD; SVG-OM; SVG-RCA; SVG-DIAG;  Surgeon: Dwayne Borne, MD;  Location: [facility] OR;  Service: Open Heart Surgery;  Laterality: N/A;   CYST REMOVAL TRUNK N/A 08/24/2016   Procedure: EXCISION OF BACK ABSCESS;  Surgeon: Dwayne Norman, MD;  Location: [facility];  Service: General;  Laterality: N/A;   FINGER AMPUTATION Right 1980's   3rd & 4th digits "got them mashed" (01/17/2013)   KNEE ARTHROSCOPY Right 1980's   MULTIPLE EXTRACTIONS WITH ALVEOLOPLASTY Bilateral 11/21/2015   Procedure: Extraction of tooth #'s 2,12 with alveoloplasty and gross debridement of remaining dentition;  Surgeon: [name], DDS;  Location: [facility] OR;  Service: Oral Surgery;   Laterality: Bilateral;   TEE WITHOUT CARDIOVERSION N/A 11/20/2015   Procedure: TRANSESOPHAGEAL ECHOCARDIOGRAM (TEE);  Surgeon: Dwayne Morale, MD;  Location: [facility] ENDOSCOPY;  Service: Cardiovascular;  Laterality: N/A;   TEE WITHOUT CARDIOVERSION N/A 11/25/2015   Procedure: TRANSESOPHAGEAL ECHOCARDIOGRAM (TEE);  Surgeon: Dwayne Borne, MD;  Location[facility] OR;  Service: Open Heart Surgery;  Laterality: N/A;    Allergies  Allergies  Allergen Reactions   Other Anaphylaxis    mushrooms   Hydrocodone-Acetaminophen Itching     History of Present Illness    [name]  is a 62 year old male with a PMH of CAD s/p CABG and bicuspid AV s/p bioprosthetic AVR (11/25/15), HTN, HLD, GERD, DM type II, left thalamic ischemic CVA 12/2015 who presents today for follow-up.    [name] was initially seen by [name] in 2016 for evaluation of heart murmur.  2D echo was completed that showed pre He presented to the ED and 11/2015 with worsening dyspnea. Chest x-ray showed congestion and troponins were negative.  He underwent a TEE that showed severe aortic regurgitation.served EF with bicuspid AV and moderate AR.  Left heart cath was completed and showed severe multivessel CAD and patient underwent CABG and AVR by [name] that was successful.  He was admitted 12/2015 and found to have small lacunar infarct and not an embolic stroke related to his recent surgery. He had extensive small vessel disease throughout  his brain on the MRI.  He was started on aspirin and Plavix for 3 months.  He was last seen by [name] 10/2020 for follow-up.  He was noted to have elevated blood pressure and was still eating processed foods.  Since last being seen in the office patient reports that he has been doing well with no new cardiac complaints.  His blood pressure today is well-controlled at 102/70 and heart rate was 65 bpm.  He recently took his blood pressure medications prior to his visit today.  He is euvolemic on examination  today.  He notes his only complaint recently has been episodes of gout that have been resolved with colchicine.  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  He has been compliant with his medications and denies any adverse reactions..  Patient denies chest pain, palpitations, dyspnea, PND, orthopnea, nausea, vomiting, dizziness, syncope, edema, weight gain, or early satiety.  Home Medications    Current Outpatient Medications  Medication Sig Dispense Refill   allopurinol (ZYLOPRIM) 300 MG tablet Take 0.5 tablets (150 mg total) by mouth daily. 90 tablet 1   amLODipine (NORVASC) 10 MG tablet Take 1 tablet (10 mg total) by mouth daily. 90 tablet 1   atorvastatin (LIPITOR) 80 MG tablet TAKE 1 TABLET BY MOUTH ONCE DAILY AT 6 IN THE EVENING 90 tablet 3   carvedilol (COREG) 12.5 MG tablet Take 1 tablet (12.5 mg total) by mouth 2 (two) times daily with a meal. Stop metoprolol 60 tablet 6   clopidogrel (PLAVIX) 75 MG tablet Take 1 tablet (75 mg total) by mouth daily. 90 tablet 0   colchicine 0.6 MG tablet Take 1 tablet (0.6 mg total) by mouth 2 (two) times daily as needed. 20 tablet 0   EQ ALLERGY RELIEF 10 MG tablet Take 1 tablet by mouth once daily 30 tablet 2   ezetimibe (ZETIA) 10 MG tablet Take 1 tablet (10 mg total) by mouth daily. 90 tablet 3   FARXIGA 10 MG TABS tablet Take 10 mg by mouth daily.     fluticasone (FLONASE) 50 MCG/ACT nasal spray Use 2 spray(s) in each nostril once daily 48 g 0   furosemide (LASIX) 40 MG tablet 1 tablet p.o. q. Mondays, Wednesday and Fridays. 48 tablet 3   lisinopril (ZESTRIL) 40 MG tablet Take 1 tablet (40 mg total) by mouth daily. 90 tablet 1   potassium chloride SA (KLOR-CON M) 20 MEQ tablet TAKE 1 TABLET BY MOUTH ON MONDAY, Wed, FRIDAY and Saturdays. 48 tablet 2   predniSONE (DELTASONE) 20 MG tablet Take 20 mg by mouth as needed.     predniSONE (STERAPRED UNI-PAK 21  TAB) 10 MG (21) TBPK tablet Take as directed 21 tablet 3   No current facility-administered medications for this visit.     Review of Systems  Please see the history of present illness.      All other systems reviewed and are otherwise negative except as noted above.  Physical Exam    Wt Readings from Last 3 Encounters:  12/28/22 213 lb (96.6 kg)  12/13/22 219 lb (99.3 kg)  12/09/22 213 lb (96.6 kg)   VS: Vitals:   12/28/22 1030  BP: 102/70  Pulse: 65  SpO2: 99%  ,Body mass index is 34.38 kg/m.  Constitutional:      Appearance: Healthy appearance. Not in distress.  Neck:     Vascular: JVD  normal.  Pulmonary:     Effort: Pulmonary effort is normal.     Breath sounds: No wheezing. No rales. Diminished in the bases Cardiovascular:     Normal rate. Regular rhythm. Normal S1. Normal S2.      Murmurs: 2/6 systolic murmur Edema:    Peripheral edema absent.  Abdominal:     Palpations: Abdomen is soft non tender. There is no hepatomegaly.  Skin:    General: Skin is warm and dry.  Neurological:     General: No focal deficit present.     Mental Status: Alert and oriented to person, place and time.     Cranial Nerves: Cranial nerves are intact.  EKG/LABS/ Recent Cardiac Studies    ECG personally reviewed by me today -sinus rhythm with first-degree AVB and incomplete RBBB with rate of 65 and PR interval of 218  Cardiac Studies & Procedures   CARDIAC CATHETERIZATION  CARDIAC CATHETERIZATION 11/19/2015  Narrative 1. Mid RCA lesion, 85% stenosed. 2. RPDA lesion, 75% stenosed. 3. Prox Cx to Dist Cx lesion, 65% stenosed. 4. Ost 2nd Mrg to 2nd Mrg lesion, 60% stenosed. 5. LM lesion, 50% stenosed. 6. 1st Diag lesion, 80% stenosed. 7. Mid LAD lesion, 90% stenosed. 8. Dist LAD lesion, 50% stenosed.   Severe coronary disease with high-grade segmental stenosis in the mid LAD, high-grade obstruction in the first diagonal, significant stenosis in the mid RCA, and eccentric  segmental 50% left main.  The circumflex coronary artery contains a proximal and mid 50-60% stenoses.  Left ventriculography was not performed due to markedly elevated end-diastolic pressures, 46 mmHg.  Severe pulmonary hypertension, measured at 103/53 mmHg  Marked elevation and pulmonary capillary wedge mean pressure 33 mmHg  Known severe aortic regurgitation with hemodynamics compatible with acute on chronic process.  RECOMMENDATIONS:   The patient will be moved to the intensive care unit or a transitional care unit bed  Aggressive diuresis to lower pulmonary pressures and end-diastolic pressure.  Consider advanced heart failure service consultation.  Consider decreasing the dose of beta blocker therapy as this will shorten diastolic left ventricular filling time and may help to lower LVEDP and LV end-diastolic dimension.  Hemodynamically, the patient is severely compromised. CO-OX 53%.  Monitor kidney function. 90 cc of contrast was used for the procedure.  Will need aortic valve replacement and multivessel coronary bypass grafting.  Findings Coronary Findings Diagnostic  Dominance: Co-dominant  Left Main Eccentric.  Left Anterior Descending The lesion is type C Diffuse eccentric. The lesion is type C.  First Diagonal Branch  First Septal Branch The vessel is small in size.  Ramus Intermedius . Vessel is small.  Left Circumflex . Vessel is small. Eccentric.  Second Obtuse Marginal Branch The vessel is large in size. Eccentric.  Third Obtuse Marginal Branch The vessel is small in size.  Right Coronary Artery The lesion is type C Eccentric.  Right Posterior Descending Artery The lesion is type C.  Intervention  No interventions have been documented.     ECHOCARDIOGRAM  ECHOCARDIOGRAM COMPLETE 05/23/2018  Narrative *[place]* *[facility]* [address] [place], [place]  [address] [phone]  ------------------------------------------------------------------- Transthoracic Echocardiography  Patient:    Caylan, Schifano MR #:       [id number] Study Date: 04/11/2018 Gender:     M Age:        57 Height:     167.6 cm Weight:     97.7 kg BSA:        2.17 m^2  Pt. Status: Room:       3W28C  Delena Serve [id number]     ZDG, UYQIH [id number] Orma Flaming [id number] ATTENDING    Meridee Score, MD SONOGRAPHER  Roosvelt Maser, RDCS PERFORMING   [facility], Inpatient  cc:  ------------------------------------------------------------------- LV EF: 65% -   70%  ------------------------------------------------------------------- Indications:      TIA 435.9.  ------------------------------------------------------------------- History:   PMH:   Coronary artery disease.  Risk factors: Hypertension. Diabetes mellitus. Dyslipidemia.  ------------------------------------------------------------------- Study Conclusions  - Prior tests and procedures: 23 mm magna ease bioprosthetic aortic valve 11/25/15. - Left ventricle: The cavity size was normal. Wall thickness was normal. Systolic function was vigorous. The estimated ejection fraction was in the range of 65% to 70%. Wall motion was normal; there were no regional wall motion abnormalities. Doppler parameters are consistent with abnormal left ventricular relaxation (grade 1 diastolic dysfunction). - Aortic valve: A bioprosthesis was present and functioning normally. There was trivial regurgitation. - Mitral valve: There was trivial regurgitation. - Left atrium: The atrium was mildly dilated. - Right ventricle: Pacer wire or catheter noted in right ventricle.  Impressions:  - No cardiac source of emboli was indentified. Compared to the prior study, there has been no significant interval change.  ------------------------------------------------------------------- Labs, prior tests, procedures,  and surgery: AICD. 23 mm magna ease bioprosthetic aortic valve 11/25/15. Coronary artery bypass grafting.  ------------------------------------------------------------------- Study data:  Comparison was made to the study of 12/08/2016.  Study status:  Routine.  Procedure:  The patient reported no pain pre or post test. Transthoracic echocardiography. Image quality was adequate. Intravenous contrast (Definity) was administered.  Study completion:  There were no complications.          Transthoracic echocardiography.  M-mode, complete 2D, spectral Doppler, and color Doppler.  Birthdate:  Patient birthdate: 1961-07-14.  Age:  Patient is 62 yr old.  Sex:  Gender: male.    BMI: 34.8 kg/m^2.  Blood pressure:     105/78  Patient status:  Inpatient.  Study date: Study date: 04/11/2018. Study time: 03:05 PM.  Location:  Bedside.  -------------------------------------------------------------------  ------------------------------------------------------------------- Left ventricle:  Poorly visualized. The cavity size was normal. Wall thickness was normal. Systolic function was vigorous. The estimated ejection fraction was in the range of 65% to 70%. Wall motion was normal; there were no regional wall motion abnormalities. Doppler parameters are consistent with abnormal left ventricular relaxation (grade 1 diastolic dysfunction).  ------------------------------------------------------------------- Aortic valve:   Normal thickness leaflets. A bioprosthesis was present and functioning normally. Mobility was not restricted. Doppler:  Transvalvular velocity was within the normal range. There was no stenosis. There was trivial regurgitation.  ------------------------------------------------------------------- Aorta:  Aortic root: The aortic root was normal in size.  ------------------------------------------------------------------- Mitral valve:   Structurally normal valve.   Mobility was  not restricted.  Doppler:  Transvalvular velocity was within the normal range. There was no evidence for stenosis. There was trivial regurgitation.  ------------------------------------------------------------------- Left atrium:  The atrium was mildly dilated.  ------------------------------------------------------------------- Right ventricle:  Poorly visualized. The cavity size was normal. Wall thickness was normal. Pacer wire or catheter noted in right ventricle. Systolic function was normal.  ------------------------------------------------------------------- Pulmonic valve:   Poorly visualized.  Structurally normal valve. Cusp separation was normal.  Doppler:  Transvalvular velocity was within the normal range. There was no evidence for stenosis. There was no regurgitation.  ------------------------------------------------------------------- Tricuspid valve:   Structurally normal valve.    Doppler: Transvalvular velocity was within  the normal range. There was no regurgitation.  ------------------------------------------------------------------- Pulmonary artery:   The main pulmonary artery was normal-sized. Systolic pressure was within the normal range.  ------------------------------------------------------------------- Right atrium:  The atrium was normal in size.  ------------------------------------------------------------------- Pericardium:  There was no pericardial effusion.  ------------------------------------------------------------------- Systemic veins: Inferior vena cava: The vessel was normal in size.  ------------------------------------------------------------------- Measurements  Left ventricle                         Value        Reference LV ID, ED, PLAX chordal        (L)     29.6  mm     43 - 52 LV ID, ES, PLAX chordal        (L)     22.3  mm     23 - 38 LV fx shortening, PLAX chordal (L)     25    %      >=29 LV PW thickness, ED                     8.63  mm     --------- IVS/LV PW ratio, ED            (H)     1.34         <=1.3 Stroke volume, 2D                      74    ml     --------- Stroke volume/bsa, 2D                  34    ml/m^2 --------- LV e&', lateral                         7.71  cm/s   --------- LV E/e&', lateral                       8.94         --------- LV e&', medial                          5.08  cm/s   --------- LV E/e&', medial                        13.56        --------- LV e&', average                         6.4   cm/s   --------- LV E/e&', average                       10.77        ---------  Ventricular septum                     Value        Reference IVS thickness, ED                      11.6  mm     ---------  LVOT  Value        Reference LVOT ID, S                             22    mm     --------- LVOT area                              3.8   cm^2   --------- LVOT peak velocity, S                  105   cm/s   --------- LVOT mean velocity, S                  73.6  cm/s   --------- LVOT VTI, S                            19.6  cm     --------- LVOT peak gradient, S                  4     mm Hg  ---------  Aorta                                  Value        Reference Aortic root ID, ED                     29    mm     ---------  Left atrium                            Value        Reference LA ID, A-P, ES                         45    mm     --------- LA ID/bsa, A-P                         2.07  cm/m^2 <=2.2 LA volume, ES, 1-p A4C                 88.3  ml     --------- LA volume/bsa, ES, 1-p A4C             40.7  ml/m^2 --------- LA volume, ES, 1-p A2C                 64.1  ml     --------- LA volume/bsa, ES, 1-p A2C             29.5  ml/m^2 ---------  Mitral valve                           Value        Reference Mitral E-wave peak velocity            68.9  cm/s   --------- Mitral A-wave peak velocity            72.3  cm/s   --------- Mitral deceleration time        (H)     292   ms  150 - 230 Mitral E/A ratio, peak                 1            ---------  Systemic veins                         Value        Reference Estimated CVP                          3     mm Hg  ---------  Legend: (L)  and  (H)  mark values outside specified reference range.  ------------------------------------------------------------------- Prepared and Electronically Authenticated by  Donato Schultz, M.D. 2019-08-06T16:26:16  *[place]* *[facility]* [address] [place], [place] [address] [phone]  ------------------------------------------------------------------- Transthoracic Echocardiography  (Report amended )  Patient:    Emmanuelle, Coxe MR #:       [id number] Study Date: 04/11/2018 Gender:     M Age:        57 Height:     167.6 cm Weight:     97.7 kg BSA:        2.17 m^2 Pt. Status: Room:       3W28C  ADMITTING    Pearson Grippe [id number] VHQIONGE     XBM, WUXLK [id number] Orma Flaming [id number] SONOGRAPHER  Roosvelt Maser, RDCS PERFORMING   [facility], Inpatient ATTENDING    Albertine Grates [id number]  cc:  ------------------------------------------------------------------- LV EF: 65% -   70%  ------------------------------------------------------------------- Indications:      TIA 435.9.  ------------------------------------------------------------------- History:   PMH:   Coronary artery disease.  Risk factors: Hypertension. Diabetes mellitus. Dyslipidemia.  ------------------------------------------------------------------- Study Conclusions  - Prior tests and procedures: 23 mm magna ease bioprosthetic aortic valve 11/25/15. - Left ventricle: The cavity size was normal. Wall thickness was normal. Systolic function was vigorous. The estimated ejection fraction was in the range of 65% to 70%. Wall motion was normal; there were no regional wall motion abnormalities. Doppler parameters are consistent with abnormal left  ventricular relaxation (grade 1 diastolic dysfunction). - Aortic valve: A bioprosthesis was present and functioning normally. There was trivial regurgitation. - Mitral valve: There was trivial regurgitation. - Left atrium: The atrium was mildly dilated.  Impressions:  - No cardiac source of emboli was indentified. Compared to the prior study, there has been no significant interval change.  ------------------------------------------------------------------- Labs, prior tests, procedures, and surgery: AICD. 23 mm magna ease bioprosthetic aortic valve 11/25/15. Coronary artery bypass grafting.  ------------------------------------------------------------------- Study data:  Comparison was made to the study of 12/08/2016.  Study status:  Routine.  Procedure:  The patient reported no pain pre or post test. Transthoracic echocardiography. Image quality was adequate. Intravenous contrast (Definity) was administered.  Study completion:  There were no complications.          Transthoracic echocardiography.  M-mode, complete 2D, spectral Doppler, and color Doppler.  Birthdate:  Patient birthdate: October 18, 1960.  Age:  Patient is 62 yr old.  Sex:  Gender: male.    BMI: 34.8 kg/m^2.  Blood pressure:     105/78  Patient status:  Inpatient.  Study date: Study date: 04/11/2018. Study time: 03:05 PM.  Location:  Bedside.  -------------------------------------------------------------------  ------------------------------------------------------------------- Left ventricle:  Poorly visualized. The cavity size was normal. Wall thickness was normal. Systolic function was vigorous. The estimated ejection fraction was in the range of  65% to 70%. Wall motion was normal; there were no regional wall motion abnormalities. Doppler parameters are consistent with abnormal left ventricular relaxation (grade 1 diastolic dysfunction).  ------------------------------------------------------------------- Aortic  valve:   Normal thickness leaflets. A bioprosthesis was present and functioning normally. Mobility was not restricted. Doppler:  Transvalvular velocity was within the normal range. There was no stenosis. There was trivial regurgitation.  ------------------------------------------------------------------- Aorta:  Aortic root: The aortic root was normal in size.  ------------------------------------------------------------------- Mitral valve:   Structurally normal valve.   Mobility was not restricted.  Doppler:  Transvalvular velocity was within the normal range. There was no evidence for stenosis. There was trivial regurgitation.  ------------------------------------------------------------------- Left atrium:  The atrium was mildly dilated.  [Narrative TRUNCATED]   TEE  ECHO TEE 11/25/2015  Narrative *[place]* *[facility]* [address] [place], [place] [address] [phone]  ------------------------------------------------------------------- Transesophageal Echocardiography  Patient:    Bryten, Maher MR #:       [id number] Study Date: 11/20/2015 Gender:     M Age:        55 Height:     162.6 cm Weight:     99.7 kg BSA:        2.17 m^2 Pt. Status: Room:       2S12C  Hilma Favors, MD ATTENDING    Evelene Croon, MD ADMITTING    Daiva Eves, [place] N PERFORMING   [name], M.D. SONOGRAPHER  Sheralyn Boatman  cc:  ------------------------------------------------------------------- LV EF: 60%  ------------------------------------------------------------------- Indications:      Biscuspid aortic valve 746.4.  Murmur 785.2.  ------------------------------------------------------------------- History:   PMH:   Congestive heart failure.  Risk factors: Hypertension. Diabetes mellitus. Dyslipidemia.  ------------------------------------------------------------------- Study Conclusions  - Left ventricle: The cavity size was mildly  dilated. Wall thickness was increased in a pattern of mild LVH. The estimated ejection fraction was 60%. Wall motion was normal; there were no regional wall motion abnormalities. - Aortic valve: Bicuspid aortic valve with mean gradient 10 mmHg across the valve (likely due to high flow, did not appear significantly stenosed) and severe regurgitation. There was holodiastolic flow reversal in the ascending thoracic aorta. - Aorta: Aortic root dilated to 3.8 cm, normal caliber ascending aorta. - Mitral valve: There was trivial regurgitation. - Left atrium: The atrium was mildly dilated. There was smoke but no definite thrombus in the left atrial appendage. - Right ventricle: The cavity size was normal. Systolic function was normal. - Right atrium: No evidence of thrombus in the atrial cavity or appendage. - Atrial septum: There was a small PFO noted by color doppler with crossing of a couple of bubbles (difficult bubble study). - Tricuspid valve: Peak RV-RA gradient 35 mmHg.  Impressions:  - Severe aortic insufficiency with bicuspid valve.  Diagnostic transesophageal echocardiography.  2D and color Doppler. Birthdate:  Patient birthdate: July 28, 1961.  Age:  Patient is 62 yr old.  Sex:  Gender: male.    BMI: 37.7 kg/m^2.  Blood pressure: 178/67  Patient status:  Outpatient.  Study date:  Study date: 11/20/2015. Study time: 07:45 AM.  Location:  Endoscopy.  -------------------------------------------------------------------  ------------------------------------------------------------------- Left ventricle:  The cavity size was mildly dilated. Wall thickness was increased in a pattern of mild LVH. The estimated ejection fraction was 60%. Wall motion was normal; there were no regional wall motion abnormalities.  ------------------------------------------------------------------- Aortic valve:  Bicuspid aortic valve with mean gradient 10 mmHg across the valve (likely due to high flow,  did not  appear significantly stenosed) and severe regurgitation. There was holodiastolic flow reversal in the ascending thoracic aorta. Doppler:     Mean gradient (S): 10 mm Hg. Peak gradient (S): 18 mm Hg.  ------------------------------------------------------------------- Aorta:  Aortic root dilated to 3.8 cm, normal caliber ascending aorta.  ------------------------------------------------------------------- Mitral valve:   Doppler:   There was no evidence for stenosis. There was trivial regurgitation.  ------------------------------------------------------------------- Left atrium:  The atrium was mildly dilated. There was smoke but no definite thrombus in the left atrial appendage.  ------------------------------------------------------------------- Atrial septum:  There was a small PFO noted by color doppler with crossing of a couple of bubbles (difficult bubble study).  ------------------------------------------------------------------- Right ventricle:  The cavity size was normal. Systolic function was normal.  ------------------------------------------------------------------- Pulmonic valve:    Structurally normal valve.   Cusp separation was normal.  ------------------------------------------------------------------- Tricuspid valve:   Doppler:  There was trivial regurgitation. Peak RV-RA gradient 35 mmHg.  ------------------------------------------------------------------- Right atrium:  The atrium was normal in size.  No evidence of thrombus in the atrial cavity or appendage.  ------------------------------------------------------------------- Pericardium:  There was no pericardial effusion.  ------------------------------------------------------------------- Post procedure conclusions Ascending Aorta:  - Aortic root dilated to 3.8 cm, normal caliber ascending  aorta.  ------------------------------------------------------------------- Measurements  Aortic valve                       Value Aortic valve peak velocity, S      210   cm/s Aortic valve mean velocity, S      139   cm/s Aortic valve VTI, S                45.5  cm Aortic mean gradient, S            10    mm Hg Aortic peak gradient, S            18    mm Hg  Legend: (L)  and  (H)  mark values outside specified reference range.  ------------------------------------------------------------------- Prepared and Electronically Authenticated by  [name], M.D. 2017-03-21T23:39:41   MONITORS  CARDIAC EVENT MONITOR 06/02/2018  Narrative  Sinus rhythm  No significant arrhythmias              Lab Results  Component Value Date   WBC 8.7 08/12/2022   HGB 10.2 (L) 08/12/2022   HCT 29.2 (L) 08/12/2022   MCV 94 08/12/2022   PLT 252 08/12/2022   Lab Results  Component Value Date   CREATININE 1.76 (H) 11/02/2022   BUN 31 (H) 11/02/2022   NA 143 11/02/2022   K 4.9 11/02/2022   CL 109 (H) 11/02/2022   CO2 20 11/02/2022   Lab Results  Component Value Date   ALT 11 11/04/2021   AST 40 11/04/2021   ALKPHOS 135 (H) 11/04/2021   BILITOT <0.2 11/04/2021   Lab Results  Component Value Date   CHOL 171 11/04/2021   HDL 60 11/04/2021   LDLCALC 90 11/04/2021   TRIG 117 11/04/2021   CHOLHDL 2.9 11/04/2021    Lab Results  Component Value Date   HGBA1C 5.9 12/13/2022     Assessment & Plan    1.  Coronary artery disease: -s/p R/LHC with severe three-vessel disease noted prior to workup for AVR repair. -Patient underwent CABG x 4 by [name] and today reports no angina or chest pain since previous visit. -Continue GDMT with carvedilol 12.5 mg twice daily, Lipitor 80 mg daily, Plavix 75 mg daily,  Zetia 10 mg daily -Continue heart healthy diet and increase physical activity to at least 150 minutes/week.  2.  History of AVR: -Patient underwent AVR replacement in  2017 due to bicuspid AVR and severe regurgitation noted. -SBE prophylaxis discussed for dental procedures -Today patient reports no angina or syncope since previous visit. -We will repeat 2D echo to evaluate valvular function and gradient  3.  Essential hypertension: -Patient's blood pressure today is controlled at 102/70 -Continue Norvasc 10 mg daily, Coreg 12.5 mg twice daily, Zestril 40 mg daily  4.  History of CVA: -12/2015 small lacunar infarct and not an embolic stroke related to his recent surgery.  -Continue GDMT with Plavix 75 mg daily  5.  First-degree AVB: -Patient has new first-degree AVB seen on EKG today with PR interval of 218.  Patient is asymptomatic. -Patient had recently taking his carvedilol and we will have him come back for a repeat EKG in 2 weeks with holding carvedilol for 2 days.  Disposition: Follow-up with Dwayne Miss, MD or APP in 12 months   Medication Adjustments/Labs and Tests Ordered: Current medicines are reviewed at length with the patient today.  Concerns regarding medicines are outlined above.   Signed, Napoleon Form, Leodis Rains, NP 12/28/2022, 10:50 AM [facility]

## 2022-12-28 ENCOUNTER — Other Ambulatory Visit: Payer: Self-pay | Admitting: Internal Medicine

## 2022-12-28 ENCOUNTER — Encounter: Payer: Self-pay | Admitting: Nurse Practitioner

## 2022-12-28 ENCOUNTER — Ambulatory Visit: Payer: PPO | Attending: Nurse Practitioner | Admitting: Nurse Practitioner

## 2022-12-28 VITALS — BP 102/70 | HR 65 | Ht 66.0 in | Wt 213.0 lb

## 2022-12-28 DIAGNOSIS — Q231 Congenital insufficiency of aortic valve: Secondary | ICD-10-CM | POA: Diagnosis not present

## 2022-12-28 DIAGNOSIS — G459 Transient cerebral ischemic attack, unspecified: Secondary | ICD-10-CM | POA: Diagnosis not present

## 2022-12-28 DIAGNOSIS — I10 Essential (primary) hypertension: Secondary | ICD-10-CM

## 2022-12-28 DIAGNOSIS — Z952 Presence of prosthetic heart valve: Secondary | ICD-10-CM

## 2022-12-28 DIAGNOSIS — I639 Cerebral infarction, unspecified: Secondary | ICD-10-CM

## 2022-12-28 DIAGNOSIS — I2510 Atherosclerotic heart disease of native coronary artery without angina pectoris: Secondary | ICD-10-CM

## 2022-12-28 DIAGNOSIS — I440 Atrioventricular block, first degree: Secondary | ICD-10-CM

## 2022-12-28 DIAGNOSIS — I25810 Atherosclerosis of coronary artery bypass graft(s) without angina pectoris: Secondary | ICD-10-CM

## 2022-12-28 NOTE — Telephone Encounter (Signed)
Requested Prescriptions  Pending Prescriptions Disp Refills   clopidogrel (PLAVIX) 75 MG tablet [Pharmacy Med Name: Clopidogrel Bisulfate 75 MG Oral Tablet] 90 tablet 0    Sig: Take 1 tablet by mouth once daily     Hematology: Antiplatelets - clopidogrel Failed - 12/28/2022  6:31 AM      Failed - HCT in normal range and within 180 days    Hematocrit  Date Value Ref Range Status  08/12/2022 29.2 (L) 37.5 - 51.0 % Final         Failed - HGB in normal range and within 180 days    Hemoglobin  Date Value Ref Range Status  08/12/2022 10.2 (L) 13.0 - 17.7 g/dL Final         Failed - Cr in normal range and within 360 days    Creat  Date Value Ref Range Status  02/24/2015 1.05 0.50 - 1.35 mg/dL Final   Creatinine, Ser  Date Value Ref Range Status  11/02/2022 1.76 (H) 0.76 - 1.27 mg/dL Final   Creatinine, Urine  Date Value Ref Range Status  06/24/2016 58 20 - 370 mg/dL Final         Passed - PLT in normal range and within 180 days    Platelets  Date Value Ref Range Status  08/12/2022 252 150 - 450 x10E3/uL Final         Passed - Valid encounter within last 6 months    Recent Outpatient Visits           2 weeks ago Type 2 diabetes mellitus with obesity   [facility] Marcine Matar, MD   1 month ago Dyslipidemia   [facility], Cornelius Moras, RPH-CPP   1 month ago Edema of both legs   [facility] Marcine Matar, MD   3 months ago Subacute cough   [facility] [place], Odette Horns, MD   4 months ago Diabetes mellitus with macroalbuminuric diabetic nephropathy [facility])    [facility] Marcine Matar, MD       Future Appointments             In 3 months Laural Benes, Binnie Rail, MD [facility]

## 2022-12-28 NOTE — Patient Instructions (Addendum)
Medication Instructions:  Your physician recommends that you continue on your current medications as directed. Please refer to the Current Medication list given to you today. *If you need a refill on your cardiac medications before your next appointment, please call your pharmacy*   Lab Work: None ordered   Testing/Procedures: Your physician has requested that you have an echocardiogram. Echocardiography is a painless test that uses sound waves to create images of your heart. It provides your doctor with information about the size and shape of your heart and how well your heart's chambers and valves are working. This procedure takes approximately one hour. There are no restrictions for this procedure. Please do NOT wear cologne, perfume, aftershave, or lotions (deodorant is allowed). Please arrive 15 minutes prior to your appointment time.   Follow-Up: At [facility], you and your health needs are our priority.  As part of our continuing mission to provide you with exceptional heart care, we have created designated Provider Care Teams.  These Care Teams include your primary Cardiologist (physician) and Advanced Practice Providers (APPs -  Physician Assistants and Nurse Practitioners) who all work together to provide you with the care you need, when you need it.  We recommend signing up for the patient portal called "MyChart".  Sign up information is provided on this After Visit Summary.  MyChart is used to connect with patients for Virtual Visits (Telemedicine).  Patients are able to view lab/test results, encounter notes, upcoming appointments, etc.  Non-urgent messages can be sent to your provider as well.   To learn more about what you can do with MyChart, go to [email or web address].    Your next appointment:   12 month(s)  Provider:   Kristeen Miss, MD     2 weeks NURSE VISIT FOR EKG (PATIENT MUST HOLD COREG FOR 2 DAYS PRIOR)  Other Instructions

## 2023-01-11 ENCOUNTER — Ambulatory Visit: Payer: PPO | Attending: Cardiovascular Disease | Admitting: *Deleted

## 2023-01-11 VITALS — BP 128/82 | HR 78 | Ht 66.0 in

## 2023-01-11 DIAGNOSIS — I440 Atrioventricular block, first degree: Secondary | ICD-10-CM | POA: Diagnosis not present

## 2023-01-11 MED ORDER — CARVEDILOL 6.25 MG PO TABS: 6.2500 mg | ORAL_TABLET | Freq: Two times a day (BID) | ORAL | 2 refills | Status: DC

## 2023-01-11 MED ORDER — CARVEDILOL 6.25 MG PO TABS: 6.2500 mg | ORAL_TABLET | Freq: Two times a day (BID) | ORAL | 9 refills | Status: DC

## 2023-01-11 NOTE — Addendum Note (Signed)
Addended by: Baird Lyons on: 01/11/2023 01:01 PM   Modules accepted: Orders

## 2023-01-11 NOTE — Patient Instructions (Signed)
Medication Instructions:  Your physician has recommended you make the following change in your medication:  DECREASE Carvedilol (Coreg) to 6.25 mg twice daily  *If you need a refill on your cardiac medications before your next appointment, please call your pharmacy*   Lab Work: None ordered If you have labs (blood work) drawn today and your tests are completely normal, you will receive your results only by: MyChart Message (if you have MyChart) OR A paper copy in the mail If you have any lab test that is abnormal or we need to change your treatment, we will call you to review the results.   Testing/Procedures: None ordered   Follow-Up: At [facility], you and your health needs are our priority.  As part of our continuing mission to provide you with exceptional heart care, we have created designated Provider Care Teams.  These Care Teams include your primary Cardiologist (physician) and Advanced Practice Providers (APPs -  Physician Assistants and Nurse Practitioners) who all work together to provide you with the care you need, when you need it.  We recommend signing up for the patient portal called "MyChart".  Sign up information is provided on this After Visit Summary.  MyChart is used to connect with patients for Virtual Visits (Telemedicine).  Patients are able to view lab/test results, encounter notes, upcoming appointments, etc.  Non-urgent messages can be sent to your provider as well.   To learn more about what you can do with MyChart, go to [email or web address].    Your next appointment:   Keep arranged   follow up  The format for your next appointment:   In Person  Provider:   Kristeen Miss, MD     Thank you for choosing [facility]!!

## 2023-01-11 NOTE — Progress Notes (Signed)
   Nurse Visit   Date of Encounter: 01/11/2023 ID: [name], DOB [birth year], MRN [id number]  PCP:  Marcine Matar, MD   [facility] Providers Cardiologist:  Kristeen Miss, MD      Visit Details   VS:  BP 128/82   Pulse 78   Ht 5\' 6"  (1.676 m)   BMI 34.38 kg/m  , BMI Body mass index is 34.38 kg/m.  Wt Readings from Last 3 Encounters:  12/28/22 213 lb (96.6 kg)  12/13/22 219 lb (99.3 kg)  12/09/22 213 lb (96.6 kg)     Reason for visit: EKG Performed today: Vitals, EKG, and Provider consulted:Ernest Dick, NP Changes (medications, testing, etc.) : Decrease Coreg to 6.25 mg BID Length of Visit: 15 minutes  Pt came in for follow up EKG.  Held Coreg for 2 days as instructed.  EKG today improved, reviewed w/ Robin Searing, NP.   Orders to decrease Coreg  Medications Adjustments/Labs and Tests Ordered: No orders of the defined types were placed in this encounter.  Meds ordered this encounter  Medications   DISCONTD: carvedilol (COREG) 6.25 MG tablet    Sig: Take 1 tablet (6.25 mg total) by mouth 2 (two) times daily.    Dispense:  60 tablet    Refill:  9    Dose decrease   carvedilol (COREG) 6.25 MG tablet    Sig: Take 1 tablet (6.25 mg total) by mouth 2 (two) times daily.    Dispense:  180 tablet    Refill:  2    Dose decrease     Signed, Baird Lyons, RN  01/11/2023 11:48 AM

## 2023-01-26 ENCOUNTER — Ambulatory Visit (HOSPITAL_BASED_OUTPATIENT_CLINIC_OR_DEPARTMENT_OTHER): Payer: PPO

## 2023-01-26 DIAGNOSIS — G459 Transient cerebral ischemic attack, unspecified: Secondary | ICD-10-CM | POA: Insufficient documentation

## 2023-01-26 DIAGNOSIS — R0682 Tachypnea, not elsewhere classified: Secondary | ICD-10-CM | POA: Diagnosis not present

## 2023-01-26 DIAGNOSIS — I129 Hypertensive chronic kidney disease with stage 1 through stage 4 chronic kidney disease, or unspecified chronic kidney disease: Secondary | ICD-10-CM | POA: Diagnosis present

## 2023-01-26 DIAGNOSIS — Z952 Presence of prosthetic heart valve: Secondary | ICD-10-CM | POA: Diagnosis not present

## 2023-01-26 DIAGNOSIS — R0902 Hypoxemia: Secondary | ICD-10-CM | POA: Diagnosis present

## 2023-01-26 DIAGNOSIS — E871 Hypo-osmolality and hyponatremia: Secondary | ICD-10-CM | POA: Diagnosis present

## 2023-01-26 DIAGNOSIS — J9600 Acute respiratory failure, unspecified whether with hypoxia or hypercapnia: Secondary | ICD-10-CM | POA: Diagnosis not present

## 2023-01-26 DIAGNOSIS — Z91018 Allergy to other foods: Secondary | ICD-10-CM | POA: Diagnosis not present

## 2023-01-26 DIAGNOSIS — J15211 Pneumonia due to Methicillin susceptible Staphylococcus aureus: Secondary | ICD-10-CM | POA: Diagnosis not present

## 2023-01-26 DIAGNOSIS — G936 Cerebral edema: Secondary | ICD-10-CM | POA: Diagnosis not present

## 2023-01-26 DIAGNOSIS — I10 Essential (primary) hypertension: Secondary | ICD-10-CM | POA: Insufficient documentation

## 2023-01-26 DIAGNOSIS — I2510 Atherosclerotic heart disease of native coronary artery without angina pectoris: Secondary | ICD-10-CM | POA: Insufficient documentation

## 2023-01-26 DIAGNOSIS — D649 Anemia, unspecified: Secondary | ICD-10-CM | POA: Diagnosis not present

## 2023-01-26 DIAGNOSIS — D689 Coagulation defect, unspecified: Secondary | ICD-10-CM | POA: Diagnosis present

## 2023-01-26 DIAGNOSIS — I77810 Thoracic aortic ectasia: Secondary | ICD-10-CM | POA: Diagnosis not present

## 2023-01-26 DIAGNOSIS — K851 Biliary acute pancreatitis without necrosis or infection: Secondary | ICD-10-CM | POA: Diagnosis not present

## 2023-01-26 DIAGNOSIS — Z4659 Encounter for fitting and adjustment of other gastrointestinal appliance and device: Secondary | ICD-10-CM | POA: Diagnosis not present

## 2023-01-26 DIAGNOSIS — Z452 Encounter for adjustment and management of vascular access device: Secondary | ICD-10-CM | POA: Diagnosis not present

## 2023-01-26 DIAGNOSIS — K8309 Other cholangitis: Secondary | ICD-10-CM | POA: Diagnosis not present

## 2023-01-26 DIAGNOSIS — J9602 Acute respiratory failure with hypercapnia: Secondary | ICD-10-CM | POA: Diagnosis present

## 2023-01-26 DIAGNOSIS — R918 Other nonspecific abnormal finding of lung field: Secondary | ICD-10-CM | POA: Diagnosis not present

## 2023-01-26 DIAGNOSIS — I82C12 Acute embolism and thrombosis of left internal jugular vein: Secondary | ICD-10-CM | POA: Diagnosis not present

## 2023-01-26 DIAGNOSIS — I330 Acute and subacute infective endocarditis: Secondary | ICD-10-CM | POA: Diagnosis not present

## 2023-01-26 DIAGNOSIS — J9601 Acute respiratory failure with hypoxia: Secondary | ICD-10-CM | POA: Diagnosis present

## 2023-01-26 DIAGNOSIS — R6521 Severe sepsis with septic shock: Secondary | ICD-10-CM | POA: Diagnosis present

## 2023-01-26 DIAGNOSIS — R34 Anuria and oliguria: Secondary | ICD-10-CM | POA: Diagnosis not present

## 2023-01-26 DIAGNOSIS — J1108 Influenza due to unidentified influenza virus with specified pneumonia: Secondary | ICD-10-CM | POA: Diagnosis not present

## 2023-01-26 DIAGNOSIS — T826XXA Infection and inflammatory reaction due to cardiac valve prosthesis, initial encounter: Secondary | ICD-10-CM | POA: Diagnosis present

## 2023-01-26 DIAGNOSIS — Z9911 Dependence on respirator [ventilator] status: Secondary | ICD-10-CM | POA: Diagnosis not present

## 2023-01-26 DIAGNOSIS — R4182 Altered mental status, unspecified: Secondary | ICD-10-CM | POA: Diagnosis not present

## 2023-01-26 DIAGNOSIS — I462 Cardiac arrest due to underlying cardiac condition: Secondary | ICD-10-CM | POA: Diagnosis present

## 2023-01-26 DIAGNOSIS — Z951 Presence of aortocoronary bypass graft: Secondary | ICD-10-CM | POA: Diagnosis not present

## 2023-01-26 DIAGNOSIS — E872 Acidosis, unspecified: Secondary | ICD-10-CM | POA: Diagnosis not present

## 2023-01-26 DIAGNOSIS — K567 Ileus, unspecified: Secondary | ICD-10-CM | POA: Diagnosis not present

## 2023-01-26 DIAGNOSIS — N1832 Chronic kidney disease, stage 3b: Secondary | ICD-10-CM | POA: Diagnosis present

## 2023-01-26 DIAGNOSIS — Y831 Surgical operation with implant of artificial internal device as the cause of abnormal reaction of the patient, or of later complication, without mention of misadventure at the time of the procedure: Secondary | ICD-10-CM | POA: Diagnosis present

## 2023-01-26 DIAGNOSIS — R0689 Other abnormalities of breathing: Secondary | ICD-10-CM | POA: Diagnosis not present

## 2023-01-26 DIAGNOSIS — Z8673 Personal history of transient ischemic attack (TIA), and cerebral infarction without residual deficits: Secondary | ICD-10-CM | POA: Diagnosis not present

## 2023-01-26 DIAGNOSIS — G9341 Metabolic encephalopathy: Secondary | ICD-10-CM | POA: Diagnosis present

## 2023-01-26 DIAGNOSIS — I469 Cardiac arrest, cause unspecified: Secondary | ICD-10-CM | POA: Diagnosis not present

## 2023-01-26 DIAGNOSIS — I38 Endocarditis, valve unspecified: Secondary | ICD-10-CM | POA: Diagnosis not present

## 2023-01-26 DIAGNOSIS — Z1152 Encounter for screening for COVID-19: Secondary | ICD-10-CM | POA: Diagnosis not present

## 2023-01-26 DIAGNOSIS — E874 Mixed disorder of acid-base balance: Secondary | ICD-10-CM | POA: Diagnosis present

## 2023-01-26 DIAGNOSIS — D589 Hereditary hemolytic anemia, unspecified: Secondary | ICD-10-CM | POA: Diagnosis not present

## 2023-01-26 DIAGNOSIS — R579 Shock, unspecified: Secondary | ICD-10-CM | POA: Diagnosis not present

## 2023-01-26 DIAGNOSIS — E876 Hypokalemia: Secondary | ICD-10-CM | POA: Diagnosis present

## 2023-01-26 DIAGNOSIS — N184 Chronic kidney disease, stage 4 (severe): Secondary | ICD-10-CM | POA: Diagnosis not present

## 2023-01-26 DIAGNOSIS — Q231 Congenital insufficiency of aortic valve: Secondary | ICD-10-CM | POA: Insufficient documentation

## 2023-01-26 DIAGNOSIS — I76 Septic arterial embolism: Secondary | ICD-10-CM | POA: Diagnosis not present

## 2023-01-26 DIAGNOSIS — E860 Dehydration: Secondary | ICD-10-CM | POA: Diagnosis present

## 2023-01-26 DIAGNOSIS — A4101 Sepsis due to Methicillin susceptible Staphylococcus aureus: Secondary | ICD-10-CM | POA: Diagnosis present

## 2023-01-26 DIAGNOSIS — A419 Sepsis, unspecified organism: Secondary | ICD-10-CM | POA: Diagnosis not present

## 2023-01-26 DIAGNOSIS — R410 Disorientation, unspecified: Secondary | ICD-10-CM | POA: Diagnosis not present

## 2023-01-26 DIAGNOSIS — G928 Other toxic encephalopathy: Secondary | ICD-10-CM | POA: Diagnosis not present

## 2023-01-26 DIAGNOSIS — I639 Cerebral infarction, unspecified: Secondary | ICD-10-CM | POA: Insufficient documentation

## 2023-01-26 DIAGNOSIS — I959 Hypotension, unspecified: Secondary | ICD-10-CM | POA: Diagnosis not present

## 2023-01-26 DIAGNOSIS — E7800 Pure hypercholesterolemia, unspecified: Secondary | ICD-10-CM | POA: Diagnosis present

## 2023-01-26 DIAGNOSIS — D696 Thrombocytopenia, unspecified: Secondary | ICD-10-CM | POA: Diagnosis not present

## 2023-01-26 DIAGNOSIS — Z4682 Encounter for fitting and adjustment of non-vascular catheter: Secondary | ICD-10-CM | POA: Diagnosis not present

## 2023-01-26 DIAGNOSIS — B9561 Methicillin susceptible Staphylococcus aureus infection as the cause of diseases classified elsewhere: Secondary | ICD-10-CM | POA: Diagnosis not present

## 2023-01-26 DIAGNOSIS — R000 Tachycardia, unspecified: Secondary | ICD-10-CM | POA: Diagnosis not present

## 2023-01-26 DIAGNOSIS — R6520 Severe sepsis without septic shock: Secondary | ICD-10-CM | POA: Diagnosis not present

## 2023-01-26 DIAGNOSIS — M3119 Other thrombotic microangiopathy: Secondary | ICD-10-CM | POA: Diagnosis present

## 2023-01-26 DIAGNOSIS — N179 Acute kidney failure, unspecified: Secondary | ICD-10-CM | POA: Diagnosis present

## 2023-01-26 DIAGNOSIS — R55 Syncope and collapse: Secondary | ICD-10-CM | POA: Diagnosis not present

## 2023-01-26 DIAGNOSIS — J9690 Respiratory failure, unspecified, unspecified whether with hypoxia or hypercapnia: Secondary | ICD-10-CM | POA: Diagnosis not present

## 2023-01-26 DIAGNOSIS — N170 Acute kidney failure with tubular necrosis: Secondary | ICD-10-CM | POA: Diagnosis not present

## 2023-01-26 DIAGNOSIS — E1122 Type 2 diabetes mellitus with diabetic chronic kidney disease: Secondary | ICD-10-CM | POA: Diagnosis present

## 2023-01-26 DIAGNOSIS — I63541 Cerebral infarction due to unspecified occlusion or stenosis of right cerebellar artery: Secondary | ICD-10-CM | POA: Diagnosis not present

## 2023-01-26 DIAGNOSIS — K7200 Acute and subacute hepatic failure without coma: Secondary | ICD-10-CM | POA: Diagnosis present

## 2023-01-26 LAB — ECHOCARDIOGRAM COMPLETE
AV Mean grad: 11 mmHg
AV Peak grad: 24.2 mmHg
Ao pk vel: 2.46 m/s
Area-P 1/2: 4.06 cm2
S' Lateral: 2.6 cm

## 2023-01-26 MED ORDER — PERFLUTREN LIPID MICROSPHERE
1.0000 mL | INTRAVENOUS | Status: AC | PRN
Start: 2023-01-26 — End: 2023-01-26
  Administered 2023-01-26: 2 mL via INTRAVENOUS

## 2023-01-28 ENCOUNTER — Encounter (HOSPITAL_COMMUNITY): Payer: Self-pay

## 2023-01-28 ENCOUNTER — Inpatient Hospital Stay (HOSPITAL_COMMUNITY)
Admission: EM | Admit: 2023-01-28 | Discharge: 2023-01-29 | DRG: 314 | Disposition: A | Discharge status: Other Acute Inpatient Hospital | Payer: PPO | Attending: Pulmonary Disease | Admitting: Pulmonary Disease

## 2023-01-28 ENCOUNTER — Other Ambulatory Visit: Payer: Self-pay

## 2023-01-28 DIAGNOSIS — I129 Hypertensive chronic kidney disease with stage 1 through stage 4 chronic kidney disease, or unspecified chronic kidney disease: Secondary | ICD-10-CM | POA: Diagnosis present

## 2023-01-28 DIAGNOSIS — Z8673 Personal history of transient ischemic attack (TIA), and cerebral infarction without residual deficits: Secondary | ICD-10-CM

## 2023-01-28 DIAGNOSIS — I252 Old myocardial infarction: Secondary | ICD-10-CM

## 2023-01-28 DIAGNOSIS — E7800 Pure hypercholesterolemia, unspecified: Secondary | ICD-10-CM | POA: Diagnosis present

## 2023-01-28 DIAGNOSIS — A4101 Sepsis due to Methicillin susceptible Staphylococcus aureus: Secondary | ICD-10-CM | POA: Diagnosis present

## 2023-01-28 DIAGNOSIS — I469 Cardiac arrest, cause unspecified: Secondary | ICD-10-CM

## 2023-01-28 DIAGNOSIS — Z888 Allergy status to other drugs, medicaments and biological substances status: Secondary | ICD-10-CM

## 2023-01-28 DIAGNOSIS — K219 Gastro-esophageal reflux disease without esophagitis: Secondary | ICD-10-CM | POA: Diagnosis present

## 2023-01-28 DIAGNOSIS — E860 Dehydration: Secondary | ICD-10-CM | POA: Diagnosis present

## 2023-01-28 DIAGNOSIS — D689 Coagulation defect, unspecified: Secondary | ICD-10-CM | POA: Diagnosis present

## 2023-01-28 DIAGNOSIS — J9602 Acute respiratory failure with hypercapnia: Secondary | ICD-10-CM | POA: Diagnosis present

## 2023-01-28 DIAGNOSIS — J9601 Acute respiratory failure with hypoxia: Secondary | ICD-10-CM | POA: Diagnosis present

## 2023-01-28 DIAGNOSIS — A419 Sepsis, unspecified organism: Principal | ICD-10-CM

## 2023-01-28 DIAGNOSIS — G9341 Metabolic encephalopathy: Secondary | ICD-10-CM | POA: Diagnosis present

## 2023-01-28 DIAGNOSIS — M3119 Other thrombotic microangiopathy: Secondary | ICD-10-CM | POA: Diagnosis present

## 2023-01-28 DIAGNOSIS — Z951 Presence of aortocoronary bypass graft: Secondary | ICD-10-CM

## 2023-01-28 DIAGNOSIS — I462 Cardiac arrest due to underlying cardiac condition: Secondary | ICD-10-CM | POA: Diagnosis present

## 2023-01-28 DIAGNOSIS — Z79899 Other long term (current) drug therapy: Secondary | ICD-10-CM

## 2023-01-28 DIAGNOSIS — E1122 Type 2 diabetes mellitus with diabetic chronic kidney disease: Secondary | ICD-10-CM | POA: Diagnosis present

## 2023-01-28 DIAGNOSIS — N1832 Chronic kidney disease, stage 3b: Secondary | ICD-10-CM | POA: Diagnosis present

## 2023-01-28 DIAGNOSIS — Z7902 Long term (current) use of antithrombotics/antiplatelets: Secondary | ICD-10-CM

## 2023-01-28 DIAGNOSIS — Z833 Family history of diabetes mellitus: Secondary | ICD-10-CM

## 2023-01-28 DIAGNOSIS — Z1152 Encounter for screening for COVID-19: Secondary | ICD-10-CM

## 2023-01-28 DIAGNOSIS — M109 Gout, unspecified: Secondary | ICD-10-CM | POA: Diagnosis present

## 2023-01-28 DIAGNOSIS — E876 Hypokalemia: Secondary | ICD-10-CM | POA: Diagnosis present

## 2023-01-28 DIAGNOSIS — T826XXA Infection and inflammatory reaction due to cardiac valve prosthesis, initial encounter: Principal | ICD-10-CM | POA: Diagnosis present

## 2023-01-28 DIAGNOSIS — Z91018 Allergy to other foods: Secondary | ICD-10-CM

## 2023-01-28 DIAGNOSIS — N179 Acute kidney failure, unspecified: Secondary | ICD-10-CM | POA: Diagnosis present

## 2023-01-28 DIAGNOSIS — K7200 Acute and subacute hepatic failure without coma: Secondary | ICD-10-CM | POA: Diagnosis present

## 2023-01-28 DIAGNOSIS — E874 Mixed disorder of acid-base balance: Secondary | ICD-10-CM | POA: Diagnosis present

## 2023-01-28 DIAGNOSIS — R6521 Severe sepsis with septic shock: Secondary | ICD-10-CM | POA: Diagnosis present

## 2023-01-28 DIAGNOSIS — Z885 Allergy status to narcotic agent status: Secondary | ICD-10-CM

## 2023-01-28 DIAGNOSIS — Z8249 Family history of ischemic heart disease and other diseases of the circulatory system: Secondary | ICD-10-CM

## 2023-01-28 DIAGNOSIS — Z89021 Acquired absence of right finger(s): Secondary | ICD-10-CM

## 2023-01-28 DIAGNOSIS — Y831 Surgical operation with implant of artificial internal device as the cause of abnormal reaction of the patient, or of later complication, without mention of misadventure at the time of the procedure: Secondary | ICD-10-CM | POA: Diagnosis present

## 2023-01-28 DIAGNOSIS — R7881 Bacteremia: Secondary | ICD-10-CM | POA: Diagnosis present

## 2023-01-28 DIAGNOSIS — R7989 Other specified abnormal findings of blood chemistry: Secondary | ICD-10-CM | POA: Diagnosis present

## 2023-01-28 DIAGNOSIS — I2510 Atherosclerotic heart disease of native coronary artery without angina pectoris: Secondary | ICD-10-CM | POA: Diagnosis present

## 2023-01-28 DIAGNOSIS — E871 Hypo-osmolality and hyponatremia: Secondary | ICD-10-CM | POA: Diagnosis present

## 2023-01-28 DIAGNOSIS — R0902 Hypoxemia: Secondary | ICD-10-CM

## 2023-01-28 MED ORDER — LACTATED RINGERS IV SOLN: INTRAVENOUS | Status: DC

## 2023-01-28 MED ORDER — LACTATED RINGERS IV BOLUS
1000.0000 mL | Freq: Once | INTRAVENOUS | Status: AC
  Administered 2023-01-29: 1000 mL via INTRAVENOUS

## 2023-01-28 MED ORDER — METRONIDAZOLE 500 MG/100ML IV SOLN
500.0000 mg | Freq: Once | INTRAVENOUS | Status: AC
  Administered 2023-01-29: 500 mg via INTRAVENOUS
  Filled 2023-01-28: qty 100

## 2023-01-28 MED ORDER — SODIUM CHLORIDE 0.9 % IV SOLN
2.0000 g | Freq: Once | INTRAVENOUS | Status: AC
  Administered 2023-01-29: 2 g via INTRAVENOUS
  Filled 2023-01-28: qty 12.5

## 2023-01-28 MED ORDER — VANCOMYCIN HCL IN DEXTROSE 1-5 GM/200ML-% IV SOLN
1000.0000 mg | Freq: Once | INTRAVENOUS | Status: DC
  Filled 2023-01-28: qty 200

## 2023-01-28 NOTE — ED Triage Notes (Signed)
Pt's wife came home and said he seemed off and was confused. He was found to be breathing fast and diaphoretic. EMS states he also had a near syncopal event and has had low pressures.

## 2023-01-29 ENCOUNTER — Inpatient Hospital Stay (HOSPITAL_COMMUNITY): Payer: PPO

## 2023-01-29 ENCOUNTER — Other Ambulatory Visit: Payer: Self-pay | Admitting: Internal Medicine

## 2023-01-29 ENCOUNTER — Emergency Department (HOSPITAL_COMMUNITY): Payer: PPO

## 2023-01-29 DIAGNOSIS — R279 Unspecified lack of coordination: Secondary | ICD-10-CM | POA: Diagnosis not present

## 2023-01-29 DIAGNOSIS — K8070 Calculus of gallbladder and bile duct without cholecystitis without obstruction: Secondary | ICD-10-CM | POA: Diagnosis not present

## 2023-01-29 DIAGNOSIS — J1108 Influenza due to unidentified influenza virus with specified pneumonia: Secondary | ICD-10-CM | POA: Diagnosis not present

## 2023-01-29 DIAGNOSIS — R6520 Severe sepsis without septic shock: Secondary | ICD-10-CM

## 2023-01-29 DIAGNOSIS — I82613 Acute embolism and thrombosis of superficial veins of upper extremity, bilateral: Secondary | ICD-10-CM | POA: Diagnosis not present

## 2023-01-29 DIAGNOSIS — G928 Other toxic encephalopathy: Secondary | ICD-10-CM | POA: Diagnosis not present

## 2023-01-29 DIAGNOSIS — E7800 Pure hypercholesterolemia, unspecified: Secondary | ICD-10-CM | POA: Diagnosis present

## 2023-01-29 DIAGNOSIS — I69321 Dysphasia following cerebral infarction: Secondary | ICD-10-CM | POA: Diagnosis not present

## 2023-01-29 DIAGNOSIS — K8309 Other cholangitis: Secondary | ICD-10-CM | POA: Diagnosis not present

## 2023-01-29 DIAGNOSIS — E785 Hyperlipidemia, unspecified: Secondary | ICD-10-CM | POA: Diagnosis not present

## 2023-01-29 DIAGNOSIS — E871 Hypo-osmolality and hyponatremia: Secondary | ICD-10-CM | POA: Diagnosis present

## 2023-01-29 DIAGNOSIS — Z8673 Personal history of transient ischemic attack (TIA), and cerebral infarction without residual deficits: Secondary | ICD-10-CM | POA: Diagnosis not present

## 2023-01-29 DIAGNOSIS — B9561 Methicillin susceptible Staphylococcus aureus infection as the cause of diseases classified elsewhere: Secondary | ICD-10-CM | POA: Diagnosis not present

## 2023-01-29 DIAGNOSIS — Z9911 Dependence on respirator [ventilator] status: Secondary | ICD-10-CM | POA: Diagnosis not present

## 2023-01-29 DIAGNOSIS — I330 Acute and subacute infective endocarditis: Secondary | ICD-10-CM | POA: Diagnosis not present

## 2023-01-29 DIAGNOSIS — I10 Essential (primary) hypertension: Secondary | ICD-10-CM | POA: Diagnosis not present

## 2023-01-29 DIAGNOSIS — I6350 Cerebral infarction due to unspecified occlusion or stenosis of unspecified cerebral artery: Secondary | ICD-10-CM | POA: Diagnosis not present

## 2023-01-29 DIAGNOSIS — I669 Occlusion and stenosis of unspecified cerebral artery: Secondary | ICD-10-CM | POA: Diagnosis not present

## 2023-01-29 DIAGNOSIS — E874 Mixed disorder of acid-base balance: Secondary | ICD-10-CM | POA: Diagnosis present

## 2023-01-29 DIAGNOSIS — M3119 Other thrombotic microangiopathy: Secondary | ICD-10-CM

## 2023-01-29 DIAGNOSIS — R6521 Severe sepsis with septic shock: Secondary | ICD-10-CM

## 2023-01-29 DIAGNOSIS — G9389 Other specified disorders of brain: Secondary | ICD-10-CM | POA: Diagnosis not present

## 2023-01-29 DIAGNOSIS — E876 Hypokalemia: Secondary | ICD-10-CM | POA: Diagnosis present

## 2023-01-29 DIAGNOSIS — Z992 Dependence on renal dialysis: Secondary | ICD-10-CM | POA: Diagnosis not present

## 2023-01-29 DIAGNOSIS — J15211 Pneumonia due to Methicillin susceptible Staphylococcus aureus: Secondary | ICD-10-CM | POA: Diagnosis not present

## 2023-01-29 DIAGNOSIS — Z1152 Encounter for screening for COVID-19: Secondary | ICD-10-CM | POA: Diagnosis not present

## 2023-01-29 DIAGNOSIS — R930 Abnormal findings on diagnostic imaging of skull and head, not elsewhere classified: Secondary | ICD-10-CM | POA: Diagnosis not present

## 2023-01-29 DIAGNOSIS — I2510 Atherosclerotic heart disease of native coronary artery without angina pectoris: Secondary | ICD-10-CM | POA: Diagnosis present

## 2023-01-29 DIAGNOSIS — N184 Chronic kidney disease, stage 4 (severe): Secondary | ICD-10-CM | POA: Diagnosis not present

## 2023-01-29 DIAGNOSIS — I129 Hypertensive chronic kidney disease with stage 1 through stage 4 chronic kidney disease, or unspecified chronic kidney disease: Secondary | ICD-10-CM | POA: Diagnosis not present

## 2023-01-29 DIAGNOSIS — J9600 Acute respiratory failure, unspecified whether with hypoxia or hypercapnia: Secondary | ICD-10-CM | POA: Diagnosis not present

## 2023-01-29 DIAGNOSIS — I469 Cardiac arrest, cause unspecified: Secondary | ICD-10-CM

## 2023-01-29 DIAGNOSIS — Z9689 Presence of other specified functional implants: Secondary | ICD-10-CM | POA: Diagnosis not present

## 2023-01-29 DIAGNOSIS — K8050 Calculus of bile duct without cholangitis or cholecystitis without obstruction: Secondary | ICD-10-CM | POA: Diagnosis not present

## 2023-01-29 DIAGNOSIS — R0682 Tachypnea, not elsewhere classified: Secondary | ICD-10-CM | POA: Diagnosis not present

## 2023-01-29 DIAGNOSIS — K838 Other specified diseases of biliary tract: Secondary | ICD-10-CM | POA: Diagnosis not present

## 2023-01-29 DIAGNOSIS — R531 Weakness: Secondary | ICD-10-CM | POA: Diagnosis not present

## 2023-01-29 DIAGNOSIS — I25119 Atherosclerotic heart disease of native coronary artery with unspecified angina pectoris: Secondary | ICD-10-CM | POA: Diagnosis not present

## 2023-01-29 DIAGNOSIS — I462 Cardiac arrest due to underlying cardiac condition: Secondary | ICD-10-CM | POA: Diagnosis present

## 2023-01-29 DIAGNOSIS — I079 Rheumatic tricuspid valve disease, unspecified: Secondary | ICD-10-CM | POA: Diagnosis not present

## 2023-01-29 DIAGNOSIS — R748 Abnormal levels of other serum enzymes: Secondary | ICD-10-CM | POA: Diagnosis not present

## 2023-01-29 DIAGNOSIS — K8000 Calculus of gallbladder with acute cholecystitis without obstruction: Secondary | ICD-10-CM | POA: Diagnosis not present

## 2023-01-29 DIAGNOSIS — I517 Cardiomegaly: Secondary | ICD-10-CM | POA: Diagnosis not present

## 2023-01-29 DIAGNOSIS — R7881 Bacteremia: Secondary | ICD-10-CM | POA: Diagnosis present

## 2023-01-29 DIAGNOSIS — M6281 Muscle weakness (generalized): Secondary | ICD-10-CM | POA: Diagnosis not present

## 2023-01-29 DIAGNOSIS — T826XXA Infection and inflammatory reaction due to cardiac valve prosthesis, initial encounter: Secondary | ICD-10-CM | POA: Diagnosis not present

## 2023-01-29 DIAGNOSIS — R4182 Altered mental status, unspecified: Secondary | ICD-10-CM | POA: Diagnosis not present

## 2023-01-29 DIAGNOSIS — D649 Anemia, unspecified: Secondary | ICD-10-CM | POA: Diagnosis not present

## 2023-01-29 DIAGNOSIS — Z743 Need for continuous supervision: Secondary | ICD-10-CM | POA: Diagnosis not present

## 2023-01-29 DIAGNOSIS — E118 Type 2 diabetes mellitus with unspecified complications: Secondary | ICD-10-CM | POA: Diagnosis not present

## 2023-01-29 DIAGNOSIS — D696 Thrombocytopenia, unspecified: Secondary | ICD-10-CM | POA: Diagnosis not present

## 2023-01-29 DIAGNOSIS — N1832 Chronic kidney disease, stage 3b: Secondary | ICD-10-CM | POA: Diagnosis present

## 2023-01-29 DIAGNOSIS — M109 Gout, unspecified: Secondary | ICD-10-CM | POA: Diagnosis not present

## 2023-01-29 DIAGNOSIS — A4101 Sepsis due to Methicillin susceptible Staphylococcus aureus: Secondary | ICD-10-CM | POA: Diagnosis not present

## 2023-01-29 DIAGNOSIS — D589 Hereditary hemolytic anemia, unspecified: Secondary | ICD-10-CM | POA: Diagnosis not present

## 2023-01-29 DIAGNOSIS — Z4659 Encounter for fitting and adjustment of other gastrointestinal appliance and device: Secondary | ICD-10-CM | POA: Diagnosis not present

## 2023-01-29 DIAGNOSIS — R2681 Unsteadiness on feet: Secondary | ICD-10-CM | POA: Diagnosis not present

## 2023-01-29 DIAGNOSIS — E8770 Fluid overload, unspecified: Secondary | ICD-10-CM | POA: Diagnosis not present

## 2023-01-29 DIAGNOSIS — E860 Dehydration: Secondary | ICD-10-CM | POA: Diagnosis present

## 2023-01-29 DIAGNOSIS — R34 Anuria and oliguria: Secondary | ICD-10-CM | POA: Diagnosis not present

## 2023-01-29 DIAGNOSIS — A419 Sepsis, unspecified organism: Secondary | ICD-10-CM | POA: Diagnosis not present

## 2023-01-29 DIAGNOSIS — I4891 Unspecified atrial fibrillation: Secondary | ICD-10-CM | POA: Diagnosis not present

## 2023-01-29 DIAGNOSIS — Z4682 Encounter for fitting and adjustment of non-vascular catheter: Secondary | ICD-10-CM | POA: Diagnosis not present

## 2023-01-29 DIAGNOSIS — N179 Acute kidney failure, unspecified: Secondary | ICD-10-CM | POA: Diagnosis present

## 2023-01-29 DIAGNOSIS — Q2112 Patent foramen ovale: Secondary | ICD-10-CM | POA: Diagnosis not present

## 2023-01-29 DIAGNOSIS — K8020 Calculus of gallbladder without cholecystitis without obstruction: Secondary | ICD-10-CM | POA: Diagnosis not present

## 2023-01-29 DIAGNOSIS — I639 Cerebral infarction, unspecified: Secondary | ICD-10-CM | POA: Diagnosis not present

## 2023-01-29 DIAGNOSIS — I739 Peripheral vascular disease, unspecified: Secondary | ICD-10-CM | POA: Diagnosis not present

## 2023-01-29 DIAGNOSIS — K828 Other specified diseases of gallbladder: Secondary | ICD-10-CM | POA: Diagnosis not present

## 2023-01-29 DIAGNOSIS — J9602 Acute respiratory failure with hypercapnia: Secondary | ICD-10-CM | POA: Diagnosis present

## 2023-01-29 DIAGNOSIS — E119 Type 2 diabetes mellitus without complications: Secondary | ICD-10-CM | POA: Diagnosis not present

## 2023-01-29 DIAGNOSIS — G9340 Encephalopathy, unspecified: Secondary | ICD-10-CM | POA: Diagnosis not present

## 2023-01-29 DIAGNOSIS — Z978 Presence of other specified devices: Secondary | ICD-10-CM | POA: Diagnosis not present

## 2023-01-29 DIAGNOSIS — R1310 Dysphagia, unspecified: Secondary | ICD-10-CM | POA: Diagnosis not present

## 2023-01-29 DIAGNOSIS — I76 Septic arterial embolism: Secondary | ICD-10-CM | POA: Diagnosis not present

## 2023-01-29 DIAGNOSIS — R0902 Hypoxemia: Secondary | ICD-10-CM | POA: Diagnosis present

## 2023-01-29 DIAGNOSIS — R140 Abdominal distension (gaseous): Secondary | ICD-10-CM | POA: Diagnosis not present

## 2023-01-29 DIAGNOSIS — N183 Chronic kidney disease, stage 3 unspecified: Secondary | ICD-10-CM

## 2023-01-29 DIAGNOSIS — G936 Cerebral edema: Secondary | ICD-10-CM | POA: Diagnosis not present

## 2023-01-29 DIAGNOSIS — I63541 Cerebral infarction due to unspecified occlusion or stenosis of right cerebellar artery: Secondary | ICD-10-CM | POA: Diagnosis not present

## 2023-01-29 DIAGNOSIS — K859 Acute pancreatitis without necrosis or infection, unspecified: Secondary | ICD-10-CM | POA: Diagnosis not present

## 2023-01-29 DIAGNOSIS — K567 Ileus, unspecified: Secondary | ICD-10-CM | POA: Diagnosis not present

## 2023-01-29 DIAGNOSIS — E1122 Type 2 diabetes mellitus with diabetic chronic kidney disease: Secondary | ICD-10-CM | POA: Diagnosis not present

## 2023-01-29 DIAGNOSIS — Z953 Presence of xenogenic heart valve: Secondary | ICD-10-CM | POA: Diagnosis not present

## 2023-01-29 DIAGNOSIS — Z9889 Other specified postprocedural states: Secondary | ICD-10-CM | POA: Diagnosis not present

## 2023-01-29 DIAGNOSIS — K851 Biliary acute pancreatitis without necrosis or infection: Secondary | ICD-10-CM | POA: Diagnosis not present

## 2023-01-29 DIAGNOSIS — D631 Anemia in chronic kidney disease: Secondary | ICD-10-CM | POA: Diagnosis not present

## 2023-01-29 DIAGNOSIS — N19 Unspecified kidney failure: Secondary | ICD-10-CM | POA: Diagnosis not present

## 2023-01-29 DIAGNOSIS — E872 Acidosis, unspecified: Secondary | ICD-10-CM | POA: Diagnosis not present

## 2023-01-29 DIAGNOSIS — I4510 Unspecified right bundle-branch block: Secondary | ICD-10-CM | POA: Diagnosis not present

## 2023-01-29 DIAGNOSIS — K861 Other chronic pancreatitis: Secondary | ICD-10-CM | POA: Diagnosis not present

## 2023-01-29 DIAGNOSIS — D689 Coagulation defect, unspecified: Secondary | ICD-10-CM | POA: Diagnosis present

## 2023-01-29 DIAGNOSIS — Y831 Surgical operation with implant of artificial internal device as the cause of abnormal reaction of the patient, or of later complication, without mention of misadventure at the time of the procedure: Secondary | ICD-10-CM | POA: Diagnosis present

## 2023-01-29 DIAGNOSIS — I38 Endocarditis, valve unspecified: Secondary | ICD-10-CM

## 2023-01-29 DIAGNOSIS — Z91018 Allergy to other foods: Secondary | ICD-10-CM | POA: Diagnosis not present

## 2023-01-29 DIAGNOSIS — Z452 Encounter for adjustment and management of vascular access device: Secondary | ICD-10-CM | POA: Diagnosis not present

## 2023-01-29 DIAGNOSIS — R945 Abnormal results of liver function studies: Secondary | ICD-10-CM | POA: Diagnosis not present

## 2023-01-29 DIAGNOSIS — N189 Chronic kidney disease, unspecified: Secondary | ICD-10-CM | POA: Diagnosis not present

## 2023-01-29 DIAGNOSIS — R109 Unspecified abdominal pain: Secondary | ICD-10-CM | POA: Diagnosis not present

## 2023-01-29 DIAGNOSIS — J1520 Pneumonia due to staphylococcus, unspecified: Secondary | ICD-10-CM | POA: Diagnosis not present

## 2023-01-29 DIAGNOSIS — J810 Acute pulmonary edema: Secondary | ICD-10-CM | POA: Diagnosis not present

## 2023-01-29 DIAGNOSIS — I77810 Thoracic aortic ectasia: Secondary | ICD-10-CM | POA: Diagnosis not present

## 2023-01-29 DIAGNOSIS — Z952 Presence of prosthetic heart valve: Secondary | ICD-10-CM | POA: Diagnosis not present

## 2023-01-29 DIAGNOSIS — I82C12 Acute embolism and thrombosis of left internal jugular vein: Secondary | ICD-10-CM | POA: Diagnosis not present

## 2023-01-29 DIAGNOSIS — Z951 Presence of aortocoronary bypass graft: Secondary | ICD-10-CM | POA: Diagnosis not present

## 2023-01-29 DIAGNOSIS — R160 Hepatomegaly, not elsewhere classified: Secondary | ICD-10-CM | POA: Diagnosis not present

## 2023-01-29 DIAGNOSIS — N170 Acute kidney failure with tubular necrosis: Secondary | ICD-10-CM | POA: Diagnosis not present

## 2023-01-29 DIAGNOSIS — K7200 Acute and subacute hepatic failure without coma: Secondary | ICD-10-CM | POA: Diagnosis present

## 2023-01-29 DIAGNOSIS — G9341 Metabolic encephalopathy: Secondary | ICD-10-CM | POA: Diagnosis present

## 2023-01-29 DIAGNOSIS — Z8674 Personal history of sudden cardiac arrest: Secondary | ICD-10-CM | POA: Diagnosis not present

## 2023-01-29 DIAGNOSIS — J9601 Acute respiratory failure with hypoxia: Secondary | ICD-10-CM | POA: Diagnosis present

## 2023-01-29 DIAGNOSIS — I499 Cardiac arrhythmia, unspecified: Secondary | ICD-10-CM | POA: Diagnosis not present

## 2023-01-29 DIAGNOSIS — R935 Abnormal findings on diagnostic imaging of other abdominal regions, including retroperitoneum: Secondary | ICD-10-CM | POA: Diagnosis not present

## 2023-01-29 HISTORY — DX: Cardiac arrest, cause unspecified: I46.9

## 2023-01-29 HISTORY — DX: Methicillin susceptible Staphylococcus aureus infection as the cause of diseases classified elsewhere: B95.61

## 2023-01-29 LAB — LACTIC ACID, PLASMA
Lactic Acid, Venous: 4.5 mmol/L (ref 0.5–1.9)
Lactic Acid, Venous: 5.7 mmol/L (ref 0.5–1.9)
Lactic Acid, Venous: 6.6 mmol/L (ref 0.5–1.9)
Lactic Acid, Venous: 8.2 mmol/L (ref 0.5–1.9)

## 2023-01-29 LAB — URINALYSIS, W/ REFLEX TO CULTURE (INFECTION SUSPECTED)
Glucose, UA: NEGATIVE mg/dL
Ketones, ur: NEGATIVE mg/dL
Leukocytes,Ua: NEGATIVE
Nitrite: NEGATIVE
Protein, ur: >=300 mg/dL — AB
Specific Gravity, Urine: 1.031 — ABNORMAL HIGH (ref 1.005–1.030)
pH: 5 (ref 5.0–8.0)

## 2023-01-29 LAB — COMPREHENSIVE METABOLIC PANEL
ALT: 25 U/L (ref 0–44)
ALT: 35 U/L (ref 0–44)
ALT: 41 U/L (ref 0–44)
AST: 134 U/L — ABNORMAL HIGH (ref 15–41)
AST: 190 U/L — ABNORMAL HIGH (ref 15–41)
AST: 192 U/L — ABNORMAL HIGH (ref 15–41)
Albumin: 2.4 g/dL — ABNORMAL LOW (ref 3.5–5.0)
Albumin: 2.8 g/dL — ABNORMAL LOW (ref 3.5–5.0)
Albumin: 2.9 g/dL — ABNORMAL LOW (ref 3.5–5.0)
Alkaline Phosphatase: 101 U/L (ref 38–126)
Alkaline Phosphatase: 104 U/L (ref 38–126)
Alkaline Phosphatase: 96 U/L (ref 38–126)
Anion gap: 16 — ABNORMAL HIGH (ref 5–15)
Anion gap: 20 — ABNORMAL HIGH (ref 5–15)
Anion gap: 24 — ABNORMAL HIGH (ref 5–15)
BUN: 42 mg/dL — ABNORMAL HIGH (ref 8–23)
BUN: 51 mg/dL — ABNORMAL HIGH (ref 8–23)
BUN: 54 mg/dL — ABNORMAL HIGH (ref 8–23)
CO2: 11 mmol/L — ABNORMAL LOW (ref 22–32)
CO2: 16 mmol/L — ABNORMAL LOW (ref 22–32)
CO2: 25 mmol/L (ref 22–32)
Calcium: 7.4 mg/dL — ABNORMAL LOW (ref 8.9–10.3)
Calcium: 7.9 mg/dL — ABNORMAL LOW (ref 8.9–10.3)
Calcium: 8.9 mg/dL (ref 8.9–10.3)
Chloride: 100 mmol/L (ref 98–111)
Chloride: 107 mmol/L (ref 98–111)
Chloride: 85 mmol/L — ABNORMAL LOW (ref 98–111)
Creatinine, Ser: 3.89 mg/dL — ABNORMAL HIGH (ref 0.61–1.24)
Creatinine, Ser: 4.24 mg/dL — ABNORMAL HIGH (ref 0.61–1.24)
Creatinine, Ser: 4.65 mg/dL — ABNORMAL HIGH (ref 0.61–1.24)
GFR, Estimated: 13 mL/min — ABNORMAL LOW (ref >60)
GFR, Estimated: 15 mL/min — ABNORMAL LOW (ref >60)
GFR, Estimated: 17 mL/min — ABNORMAL LOW (ref >60)
Glucose, Bld: 156 mg/dL — ABNORMAL HIGH (ref 70–99)
Glucose, Bld: 171 mg/dL — ABNORMAL HIGH (ref 70–99)
Glucose, Bld: 89 mg/dL (ref 70–99)
Potassium: 3 mmol/L — ABNORMAL LOW (ref 3.5–5.1)
Potassium: 4 mmol/L (ref 3.5–5.1)
Potassium: 4.2 mmol/L (ref 3.5–5.1)
Sodium: 134 mmol/L — ABNORMAL LOW (ref 135–145)
Sodium: 134 mmol/L — ABNORMAL LOW (ref 135–145)
Sodium: 136 mmol/L (ref 135–145)
Total Bilirubin: 1.9 mg/dL — ABNORMAL HIGH (ref 0.3–1.2)
Total Bilirubin: 2.6 mg/dL — ABNORMAL HIGH (ref 0.3–1.2)
Total Bilirubin: 6 mg/dL — ABNORMAL HIGH (ref 0.3–1.2)
Total Protein: 6.1 g/dL — ABNORMAL LOW (ref 6.5–8.1)
Total Protein: 7 g/dL (ref 6.5–8.1)
Total Protein: 7 g/dL (ref 6.5–8.1)

## 2023-01-29 LAB — CBC WITH DIFFERENTIAL/PLATELET
Abs Immature Granulocytes: 0.5 10*3/uL — ABNORMAL HIGH (ref 0.00–0.07)
Abs Immature Granulocytes: 1.57 10*3/uL — ABNORMAL HIGH (ref 0.00–0.07)
Basophils Absolute: 0 10*3/uL (ref 0.0–0.1)
Basophils Absolute: 0.1 10*3/uL (ref 0.0–0.1)
Basophils Relative: 0 %
Basophils Relative: 1 %
Eosinophils Absolute: 0 10*3/uL (ref 0.0–0.5)
Eosinophils Absolute: 0 10*3/uL (ref 0.0–0.5)
Eosinophils Relative: 0 %
Eosinophils Relative: 0 %
HCT: 22.9 % — ABNORMAL LOW (ref 39.0–52.0)
HCT: 23.3 % — ABNORMAL LOW (ref 39.0–52.0)
Hemoglobin: 6.8 g/dL — CL (ref 13.0–17.0)
Hemoglobin: 7.7 g/dL — ABNORMAL LOW (ref 13.0–17.0)
Immature Granulocytes: 15 %
Lymphocytes Relative: 4 %
Lymphocytes Relative: 9 %
Lymphs Abs: 0.4 10*3/uL — ABNORMAL LOW (ref 0.7–4.0)
Lymphs Abs: 0.9 10*3/uL (ref 0.7–4.0)
MCH: 34.2 pg — ABNORMAL HIGH (ref 26.0–34.0)
MCH: 34.3 pg — ABNORMAL HIGH (ref 26.0–34.0)
MCHC: 29.7 g/dL — ABNORMAL LOW (ref 30.0–36.0)
MCHC: 33 g/dL (ref 30.0–36.0)
MCV: 103.6 fL — ABNORMAL HIGH (ref 80.0–100.0)
MCV: 115.7 fL — ABNORMAL HIGH (ref 80.0–100.0)
Metamyelocytes Relative: 3 %
Monocytes Absolute: 0.8 10*3/uL (ref 0.1–1.0)
Monocytes Absolute: 3.6 10*3/uL — ABNORMAL HIGH (ref 0.1–1.0)
Monocytes Relative: 34 %
Monocytes Relative: 8 %
Myelocytes: 2 %
Neutro Abs: 4.6 10*3/uL (ref 1.7–7.7)
Neutro Abs: 8 10*3/uL — ABNORMAL HIGH (ref 1.7–7.7)
Neutrophils Relative %: 41 %
Neutrophils Relative %: 83 %
Platelets: 121 10*3/uL — ABNORMAL LOW (ref 150–400)
Platelets: 68 10*3/uL — ABNORMAL LOW (ref 150–400)
RBC: 1.98 MIL/uL — ABNORMAL LOW (ref 4.22–5.81)
RBC: 2.25 MIL/uL — ABNORMAL LOW (ref 4.22–5.81)
RDW: 16.8 % — ABNORMAL HIGH (ref 11.5–15.5)
RDW: 19.6 % — ABNORMAL HIGH (ref 11.5–15.5)
WBC: 10.8 10*3/uL — ABNORMAL HIGH (ref 4.0–10.5)
WBC: 9.6 10*3/uL (ref 4.0–10.5)
nRBC: 0 /100 WBC
nRBC: 0.3 % — ABNORMAL HIGH (ref 0.0–0.2)
nRBC: 0.5 % — ABNORMAL HIGH (ref 0.0–0.2)

## 2023-01-29 LAB — CBC
HCT: 20.7 % — ABNORMAL LOW (ref 39.0–52.0)
Hemoglobin: 6.6 g/dL — CL (ref 13.0–17.0)
MCH: 34.2 pg — ABNORMAL HIGH (ref 26.0–34.0)
MCHC: 31.9 g/dL (ref 30.0–36.0)
MCV: 107.3 fL — ABNORMAL HIGH (ref 80.0–100.0)
Platelets: 82 10*3/uL — ABNORMAL LOW (ref 150–400)
RBC: 1.93 MIL/uL — ABNORMAL LOW (ref 4.22–5.81)
RDW: 17.8 % — ABNORMAL HIGH (ref 11.5–15.5)
WBC: 11.4 10*3/uL — ABNORMAL HIGH (ref 4.0–10.5)
nRBC: 0.4 % — ABNORMAL HIGH (ref 0.0–0.2)

## 2023-01-29 LAB — BLOOD CULTURE ID PANEL (REFLEXED) - BCID2

## 2023-01-29 LAB — POCT I-STAT 7, (LYTES, BLD GAS, ICA,H+H)
Acid-Base Excess: 2 mmol/L (ref 0.0–2.0)
Acid-base deficit: 8 mmol/L — ABNORMAL HIGH (ref 0.0–2.0)
Acid-base deficit: 9 mmol/L — ABNORMAL HIGH (ref 0.0–2.0)
Acid-base deficit: 4 mmol/L — ABNORMAL HIGH (ref 0.0–2.0)
Bicarbonate: 17.4 mmol/L — ABNORMAL LOW (ref 20.0–28.0)
Bicarbonate: 17.3 mmol/L — ABNORMAL LOW (ref 20.0–28.0)
Bicarbonate: 22.9 mmol/L (ref 20.0–28.0)
Bicarbonate: 27.3 mmol/L (ref 20.0–28.0)
Calcium, Ion: 1.09 mmol/L — ABNORMAL LOW (ref 1.15–1.40)
Calcium, Ion: 1.07 mmol/L — ABNORMAL LOW (ref 1.15–1.40)
Calcium, Ion: 0.94 mmol/L — ABNORMAL LOW (ref 1.15–1.40)
Calcium, Ion: 0.88 mmol/L — CL (ref 1.15–1.40)
HCT: 20 % — ABNORMAL LOW (ref 39.0–52.0)
HCT: 23 % — ABNORMAL LOW (ref 39.0–52.0)
HCT: 40 % (ref 39.0–52.0)
HCT: 31 % — ABNORMAL LOW (ref 39.0–52.0)
Hemoglobin: 6.8 g/dL — CL (ref 13.0–17.0)
Hemoglobin: 7.8 g/dL — ABNORMAL LOW (ref 13.0–17.0)
Hemoglobin: 13.6 g/dL (ref 13.0–17.0)
Hemoglobin: 10.5 g/dL — ABNORMAL LOW (ref 13.0–17.0)
O2 Saturation: 97 %
O2 Saturation: 99 %
O2 Saturation: 100 %
O2 Saturation: 99 %
Patient temperature: 95.2
Patient temperature: 30.9
Patient temperature: 36.2
Patient temperature: 36.4
Potassium: 4.2 mmol/L (ref 3.5–5.1)
Potassium: 4.4 mmol/L (ref 3.5–5.1)
Potassium: 3.9 mmol/L (ref 3.5–5.1)
Potassium: 4 mmol/L (ref 3.5–5.1)
Sodium: 137 mmol/L (ref 135–145)
Sodium: 137 mmol/L (ref 135–145)
Sodium: 135 mmol/L (ref 135–145)
Sodium: 133 mmol/L — ABNORMAL LOW (ref 135–145)
TCO2: 18 mmol/L — ABNORMAL LOW (ref 22–32)
TCO2: 18 mmol/L — ABNORMAL LOW (ref 22–32)
TCO2: 24 mmol/L (ref 22–32)
TCO2: 29 mmol/L (ref 22–32)
pCO2 arterial: 32 mmHg (ref 32–48)
pCO2 arterial: 28.6 mmHg — ABNORMAL LOW (ref 32–48)
pCO2 arterial: 45.8 mmHg (ref 32–48)
pCO2 arterial: 42.3 mmHg (ref 32–48)
pH, Arterial: 7.335 — ABNORMAL LOW (ref 7.35–7.45)
pH, Arterial: 7.358 (ref 7.35–7.45)
pH, Arterial: 7.304 — ABNORMAL LOW (ref 7.35–7.45)
pH, Arterial: 7.416 (ref 7.35–7.45)
pO2, Arterial: 89 mmHg (ref 83–108)
pO2, Arterial: 111 mmHg — ABNORMAL HIGH (ref 83–108)
pO2, Arterial: 200 mmHg — ABNORMAL HIGH (ref 83–108)
pO2, Arterial: 116 mmHg — ABNORMAL HIGH (ref 83–108)

## 2023-01-29 LAB — PROTIME-INR
INR: 2.3 — ABNORMAL HIGH (ref 0.8–1.2)
Prothrombin Time: 25.1 seconds — ABNORMAL HIGH (ref 11.4–15.2)

## 2023-01-29 LAB — RESP PANEL BY RT-PCR (RSV, FLU A&B, COVID)  RVPGX2
Influenza A by PCR: NEGATIVE
Influenza B by PCR: NEGATIVE
Resp Syncytial Virus by PCR: NEGATIVE
SARS Coronavirus 2 by RT PCR: NEGATIVE

## 2023-01-29 LAB — I-STAT VENOUS BLOOD GAS, ED
Acid-base deficit: 13 mmol/L — ABNORMAL HIGH (ref 0.0–2.0)
Bicarbonate: 11.2 mmol/L — ABNORMAL LOW (ref 20.0–28.0)
Calcium, Ion: 1.18 mmol/L (ref 1.15–1.40)
HCT: 24 % — ABNORMAL LOW (ref 39.0–52.0)
Hemoglobin: 8.2 g/dL — ABNORMAL LOW (ref 13.0–17.0)
O2 Saturation: 99 %
Potassium: 3.4 mmol/L — ABNORMAL LOW (ref 3.5–5.1)
Sodium: 138 mmol/L (ref 135–145)
TCO2: 12 mmol/L — ABNORMAL LOW (ref 22–32)
pCO2, Ven: 21.7 mmHg — ABNORMAL LOW (ref 44–60)
pH, Ven: 7.32 (ref 7.25–7.43)
pO2, Ven: 121 mmHg — ABNORMAL HIGH (ref 32–45)

## 2023-01-29 LAB — URINE CULTURE

## 2023-01-29 LAB — ECHOCARDIOGRAM COMPLETE
AR max vel: 1.46 cm2
AV Area VTI: 1.48 cm2
AV Area mean vel: 1.47 cm2
AV Mean grad: 12 mmHg
AV Peak grad: 22.8 mmHg
Ao pk vel: 2.39 m/s
Area-P 1/2: 3.16 cm2
Height: 66 in
S' Lateral: 3.3 cm
Weight: 3408 oz

## 2023-01-29 LAB — GLUCOSE, CAPILLARY
Glucose-Capillary: 96 mg/dL (ref 70–99)
Glucose-Capillary: 148 mg/dL — ABNORMAL HIGH (ref 70–99)
Glucose-Capillary: 147 mg/dL — ABNORMAL HIGH (ref 70–99)
Glucose-Capillary: 149 mg/dL — ABNORMAL HIGH (ref 70–99)

## 2023-01-29 LAB — CREATININE, URINE, RANDOM: Creatinine, Urine: 313 mg/dL

## 2023-01-29 LAB — I-STAT ARTERIAL BLOOD GAS, ED
Acid-base deficit: 12 mmol/L — ABNORMAL HIGH (ref 0.0–2.0)
Bicarbonate: 15.7 mmol/L — ABNORMAL LOW (ref 20.0–28.0)
Calcium, Ion: 1.15 mmol/L (ref 1.15–1.40)
HCT: 18 % — ABNORMAL LOW (ref 39.0–52.0)
Hemoglobin: 6.1 g/dL — CL (ref 13.0–17.0)
O2 Saturation: 93 %
Patient temperature: 99.9
Potassium: 3.8 mmol/L (ref 3.5–5.1)
Sodium: 139 mmol/L (ref 135–145)
TCO2: 17 mmol/L — ABNORMAL LOW (ref 22–32)
pCO2 arterial: 48.5 mmHg — ABNORMAL HIGH (ref 32–48)
pH, Arterial: 7.123 — CL (ref 7.35–7.45)
pO2, Arterial: 90 mmHg (ref 83–108)

## 2023-01-29 LAB — BPAM FFP
Blood Product Expiration Date: 202405302359
ISSUE DATE / TIME: 202405250851
Unit Type and Rh: 7300
Unit Type and Rh: 7300

## 2023-01-29 LAB — CULTURE, BLOOD (ROUTINE X 2)
Special Requests: ADEQUATE
Special Requests: ADEQUATE

## 2023-01-29 LAB — PROCALCITONIN: Procalcitonin: >150 ng/mL

## 2023-01-29 LAB — HIV ANTIBODY (ROUTINE TESTING W REFLEX): HIV Screen 4th Generation wRfx: NONREACTIVE

## 2023-01-29 LAB — PHOSPHORUS: Phosphorus: >30 mg/dL — ABNORMAL HIGH (ref 2.5–4.6)

## 2023-01-29 LAB — PREPARE RBC (CROSSMATCH)

## 2023-01-29 LAB — TYPE AND SCREEN: Unit division: 0

## 2023-01-29 LAB — DIC (DISSEMINATED INTRAVASCULAR COAGULATION)PANEL
D-Dimer, Quant: >20 ug/mL-FEU — ABNORMAL HIGH (ref 0.00–0.50)
Fibrinogen: 393 mg/dL (ref 210–475)
INR: 2.3 — ABNORMAL HIGH (ref 0.8–1.2)
Platelets: 100 10*3/uL — ABNORMAL LOW (ref 150–400)
Prothrombin Time: 25.4 seconds — ABNORMAL HIGH (ref 11.4–15.2)
aPTT: 71 seconds — ABNORMAL HIGH (ref 24–36)

## 2023-01-29 LAB — BETA-HYDROXYBUTYRIC ACID: Beta-Hydroxybutyric Acid: 0.35 mmol/L — ABNORMAL HIGH (ref 0.05–0.27)

## 2023-01-29 LAB — AMMONIA: Ammonia: 34 umol/L (ref 9–35)

## 2023-01-29 LAB — PREPARE FRESH FROZEN PLASMA: Unit division: 0

## 2023-01-29 LAB — HEMOGLOBIN AND HEMATOCRIT, BLOOD
HCT: 22.2 % — ABNORMAL LOW (ref 39.0–52.0)
Hemoglobin: 6.8 g/dL — CL (ref 13.0–17.0)

## 2023-01-29 LAB — MAGNESIUM
Magnesium: 1.3 mg/dL — ABNORMAL LOW (ref 1.7–2.4)
Magnesium: 1.4 mg/dL — ABNORMAL LOW (ref 1.7–2.4)

## 2023-01-29 LAB — MRSA NEXT GEN BY PCR, NASAL: MRSA by PCR Next Gen: NOT DETECTED

## 2023-01-29 LAB — CORTISOL: Cortisol, Plasma: 31.6 ug/dL

## 2023-01-29 LAB — LACTATE DEHYDROGENASE: LDH: 597 U/L — ABNORMAL HIGH (ref 98–192)

## 2023-01-29 LAB — APTT: aPTT: 69 seconds — ABNORMAL HIGH (ref 24–36)

## 2023-01-29 LAB — CBG MONITORING, ED: Glucose-Capillary: 92 mg/dL (ref 70–99)

## 2023-01-29 LAB — ETHANOL: Alcohol, Ethyl (B): <10 mg/dL (ref <10)

## 2023-01-29 LAB — SODIUM, URINE, RANDOM: Sodium, Ur: 39 mmol/L

## 2023-01-29 LAB — TROPONIN I (HIGH SENSITIVITY): Troponin I (High Sensitivity): 3644 ng/L (ref <18)

## 2023-01-29 MED ORDER — LORAZEPAM 2 MG/ML IJ SOLN
1.0000 mg | Freq: Once | INTRAMUSCULAR | Status: AC
  Administered 2023-01-29: 1 mg via INTRAVENOUS
  Filled 2023-01-29: qty 1

## 2023-01-29 MED ORDER — ACETAMINOPHEN 325 MG PO TABS: 650.0000 mg | ORAL_TABLET | ORAL | Status: DC | PRN

## 2023-01-29 MED ORDER — ACETAMINOPHEN 325 MG PO TABS
650.0000 mg | ORAL_TABLET | Freq: Once | ORAL | Status: AC
  Administered 2023-01-29: 650 mg via ORAL
  Filled 2023-01-29: qty 2

## 2023-01-29 MED ORDER — PRISMASOL BGK 4/2.5 32-4-2.5 MEQ/L EC SOLN
Status: DC
  Filled 2023-01-29 (×9): qty 5000

## 2023-01-29 MED ORDER — VANCOMYCIN HCL IN DEXTROSE 1-5 GM/200ML-% IV SOLN: 1000.0000 mg | INTRAVENOUS | Status: DC

## 2023-01-29 MED ORDER — EZETIMIBE 10 MG PO TABS
10.0000 mg | ORAL_TABLET | Freq: Every day | ORAL | Status: DC
  Administered 2023-01-29: 10 mg
  Filled 2023-01-29: qty 1

## 2023-01-29 MED ORDER — ATORVASTATIN CALCIUM 80 MG PO TABS: 80.0000 mg | ORAL_TABLET | Freq: Every day | ORAL | Status: DC

## 2023-01-29 MED ORDER — ORAL CARE MOUTH RINSE: 15.0000 mL | OROMUCOSAL | Status: DC | PRN

## 2023-01-29 MED ORDER — SODIUM CHLORIDE 0.9 % IV SOLN
2.0000 g | Freq: Two times a day (BID) | INTRAVENOUS | Status: DC
  Administered 2023-01-29: 2 g via INTRAVENOUS
  Filled 2023-01-29: qty 12.5

## 2023-01-29 MED ORDER — ORAL CARE MOUTH RINSE
15.0000 mL | OROMUCOSAL | Status: DC
  Administered 2023-01-29 (×4): 15 mL via OROMUCOSAL

## 2023-01-29 MED ORDER — MIDAZOLAM HCL 2 MG/2ML IJ SOLN
INTRAMUSCULAR | Status: AC
  Filled 2023-01-29: qty 4

## 2023-01-29 MED ORDER — METHYLPREDNISOLONE SODIUM SUCC 125 MG IJ SOLR
125.0000 mg | Freq: Every day | INTRAMUSCULAR | Status: DC
  Administered 2023-01-29: 125 mg via INTRAVENOUS
  Filled 2023-01-29: qty 2

## 2023-01-29 MED ORDER — NOREPINEPHRINE 16 MG/250ML-% IV SOLN
0.0000 ug/min | INTRAVENOUS | Status: DC
  Administered 2023-01-29: 58 ug/min via INTRAVENOUS
  Administered 2023-01-29: 60 ug/min via INTRAVENOUS
  Administered 2023-01-29: 70 ug/min via INTRAVENOUS
  Filled 2023-01-29 (×4): qty 250

## 2023-01-29 MED ORDER — EPINEPHRINE HCL 5 MG/250ML IV SOLN IN NS
0.5000 ug/min | INTRAVENOUS | Status: DC
  Administered 2023-01-29: 0.5 ug/min via INTRAVENOUS
  Filled 2023-01-29: qty 250

## 2023-01-29 MED ORDER — SODIUM BICARBONATE 8.4 % IV SOLN
INTRAVENOUS | Status: AC
  Filled 2023-01-29: qty 100

## 2023-01-29 MED ORDER — MIDAZOLAM HCL 2 MG/2ML IJ SOLN
2.0000 mg | Freq: Once | INTRAMUSCULAR | Status: AC
  Administered 2023-01-29: 2 mg via INTRAVENOUS

## 2023-01-29 MED ORDER — SODIUM BICARBONATE 8.4 % IV SOLN: 50.0000 meq | Freq: Once | INTRAVENOUS | Status: AC

## 2023-01-29 MED ORDER — VASOPRESSIN 20 UNITS/100 ML INFUSION FOR SHOCK
0.0000 [IU]/min | INTRAVENOUS | Status: DC
  Administered 2023-01-29 (×3): 0.03 [IU]/min via INTRAVENOUS
  Filled 2023-01-29 (×3): qty 100

## 2023-01-29 MED ORDER — SODIUM BICARBONATE 8.4 % IV SOLN: 100.0000 meq | Freq: Once | INTRAVENOUS | Status: AC

## 2023-01-29 MED ORDER — SODIUM CHLORIDE 0.9% IV SOLUTION: Freq: Once | INTRAVENOUS | Status: AC

## 2023-01-29 MED ORDER — SODIUM CHLORIDE 0.9 % IV SOLN
250.0000 mL | INTRAVENOUS | Status: DC
  Administered 2023-01-29: 250 mL via INTRAVENOUS

## 2023-01-29 MED ORDER — VANCOMYCIN VARIABLE DOSE PER UNSTABLE RENAL FUNCTION (PHARMACIST DOSING): Status: DC

## 2023-01-29 MED ORDER — SODIUM CHLORIDE 0.9 % IV SOLN: 2.0000 g | INTRAVENOUS | Status: DC

## 2023-01-29 MED ORDER — NOREPINEPHRINE 4 MG/250ML-% IV SOLN: 0.0000 ug/min | INTRAVENOUS | Status: DC

## 2023-01-29 MED ORDER — SODIUM CHLORIDE 0.9 % IV SOLN: 1.0000 g | INTRAVENOUS | Status: DC

## 2023-01-29 MED ORDER — STERILE WATER FOR INJECTION IV SOLN
INTRAVENOUS | Status: DC
  Filled 2023-01-29 (×4): qty 1000

## 2023-01-29 MED ORDER — FENTANYL BOLUS VIA INFUSION: 50.0000 ug | INTRAVENOUS | Status: DC | PRN

## 2023-01-29 MED ORDER — NOREPINEPHRINE 4 MG/250ML-% IV SOLN
INTRAVENOUS | Status: AC
  Administered 2023-01-29: 4 mg
  Filled 2023-01-29: qty 250

## 2023-01-29 MED ORDER — NAFCILLIN SODIUM 2 G IJ SOLR: 2.0000 g | INTRAMUSCULAR | Status: DC

## 2023-01-29 MED ORDER — PANTOPRAZOLE SODIUM 40 MG IV SOLR
40.0000 mg | Freq: Every day | INTRAVENOUS | Status: DC
  Administered 2023-01-29: 40 mg via INTRAVENOUS
  Filled 2023-01-29: qty 10

## 2023-01-29 MED ORDER — HYDROCORTISONE SOD SUC (PF) 100 MG IJ SOLR
100.0000 mg | Freq: Three times a day (TID) | INTRAMUSCULAR | Status: DC
  Administered 2023-01-29: 100 mg via INTRAVENOUS
  Filled 2023-01-29: qty 2

## 2023-01-29 MED ORDER — DOCUSATE SODIUM 50 MG/5ML PO LIQD: 100.0000 mg | Freq: Two times a day (BID) | ORAL | Status: DC

## 2023-01-29 MED ORDER — SODIUM BICARBONATE 8.4 % IV SOLN
INTRAVENOUS | Status: AC
  Administered 2023-01-29: 100 meq via INTRAVENOUS
  Filled 2023-01-29: qty 50

## 2023-01-29 MED ORDER — CHLORHEXIDINE GLUCONATE CLOTH 2 % EX PADS
6.0000 | MEDICATED_PAD | Freq: Every day | CUTANEOUS | Status: DC
  Administered 2023-01-29: 6 via TOPICAL

## 2023-01-29 MED ORDER — IPRATROPIUM-ALBUTEROL 0.5-2.5 (3) MG/3ML IN SOLN: 3.0000 mL | RESPIRATORY_TRACT | Status: DC | PRN

## 2023-01-29 MED ORDER — CAPLACIZUMAB-YHDP 11 MG IJ KIT
11.0000 mg | PACK | Freq: Every day | INTRAMUSCULAR | Status: DC
Start: 2023-01-29 — End: 2023-01-29

## 2023-01-29 MED ORDER — POLYETHYLENE GLYCOL 3350 17 G PO PACK: 17.0000 g | PACK | Freq: Every day | ORAL | Status: DC

## 2023-01-29 MED ORDER — FENTANYL 2500MCG IN NS 250ML (10MCG/ML) PREMIX INFUSION
50.0000 ug/h | INTRAVENOUS | Status: DC
  Administered 2023-01-29: 200 ug/h via INTRAVENOUS
  Filled 2023-01-29: qty 250

## 2023-01-29 MED ORDER — MIDAZOLAM HCL 2 MG/2ML IJ SOLN: 2.0000 mg | INTRAMUSCULAR | Status: DC | PRN

## 2023-01-29 MED ORDER — SODIUM CHLORIDE 0.9 % FOR CRRT: INTRAVENOUS_CENTRAL | Status: DC | PRN

## 2023-01-29 MED ORDER — INSULIN ASPART 100 UNIT/ML IJ SOLN: 0.0000 [IU] | INTRAMUSCULAR | Status: DC

## 2023-01-29 MED ORDER — POTASSIUM CHLORIDE 20 MEQ PO PACK
20.0000 meq | PACK | Freq: Once | ORAL | Status: AC
  Administered 2023-01-29: 20 meq
  Filled 2023-01-29: qty 1

## 2023-01-29 MED ORDER — SODIUM CHLORIDE 0.9 % IV SOLN
12.0000 g | INTRAVENOUS | Status: DC
  Administered 2023-01-29: 12 g via INTRAVENOUS
  Filled 2023-01-29: qty 48

## 2023-01-29 MED ORDER — STERILE WATER FOR INJECTION IV SOLN
INTRAVENOUS | Status: DC
  Filled 2023-01-29 (×3): qty 150

## 2023-01-29 MED ORDER — CEFAZOLIN SODIUM-DEXTROSE 2-4 GM/100ML-% IV SOLN: 2.0000 g | Freq: Two times a day (BID) | INTRAVENOUS | Status: DC

## 2023-01-29 MED ORDER — ACETAMINOPHEN 160 MG/5ML PO SOLN: 650.0000 mg | ORAL | Status: DC | PRN

## 2023-01-29 MED ORDER — STERILE WATER FOR INJECTION IV SOLN
INTRAVENOUS | Status: DC
  Filled 2023-01-29 (×4): qty 150

## 2023-01-29 MED ORDER — VANCOMYCIN HCL 2000 MG/400ML IV SOLN
2000.0000 mg | Freq: Once | INTRAVENOUS | Status: AC
  Administered 2023-01-29: 2000 mg via INTRAVENOUS
  Filled 2023-01-29: qty 400

## 2023-01-29 MED ORDER — FENTANYL 2500MCG IN NS 250ML (10MCG/ML) PREMIX INFUSION
INTRAVENOUS | Status: AC
  Administered 2023-01-29: 25 ug/h via INTRAVENOUS
  Filled 2023-01-29: qty 250

## 2023-01-29 MED ORDER — NOREPINEPHRINE 4 MG/250ML-% IV SOLN
2.0000 ug/min | INTRAVENOUS | Status: DC
  Filled 2023-01-29 (×2): qty 250

## 2023-01-29 MED ORDER — ACETAMINOPHEN 650 MG RE SUPP: 650.0000 mg | RECTAL | Status: DC | PRN

## 2023-01-29 MED ORDER — SODIUM BICARBONATE 8.4 % IV SOLN
INTRAVENOUS | Status: AC
  Administered 2023-01-29: 50 meq via INTRAVENOUS
  Filled 2023-01-29: qty 50

## 2023-01-29 MED ORDER — LACTATED RINGERS IV BOLUS
1000.0000 mL | Freq: Once | INTRAVENOUS | Status: AC
  Administered 2023-01-29: 1000 mL via INTRAVENOUS

## 2023-01-29 MED ORDER — SODIUM CHLORIDE 0.9 % IV SOLN: INTRAVENOUS | Status: DC | PRN

## 2023-01-29 MED ORDER — MAGNESIUM SULFATE 2 GM/50ML IV SOLN
2.0000 g | Freq: Once | INTRAVENOUS | Status: AC
  Administered 2023-01-29: 2 g via INTRAVENOUS
  Filled 2023-01-29: qty 50

## 2023-01-29 MED ORDER — HEPARIN SODIUM (PORCINE) 1000 UNIT/ML DIALYSIS
1000.0000 [IU] | INTRAMUSCULAR | Status: DC | PRN
  Administered 2023-01-29: 2400 [IU] via INTRAVENOUS_CENTRAL
  Filled 2023-01-29: qty 4
  Filled 2023-01-29: qty 6

## 2023-01-29 MED ORDER — NOREPINEPHRINE 4 MG/250ML-% IV SOLN
0.0000 ug/min | INTRAVENOUS | Status: DC
  Administered 2023-01-29: 60 ug/min via INTRAVENOUS
  Administered 2023-01-29: 57 ug/min via INTRAVENOUS

## 2023-01-29 NOTE — Progress Notes (Addendum)
  Patient was reviewed at 3:59 PM  On 60 of Levophed, vasopressin at 0.03, epinephrine is off about an hour, on 200 of fentanyl.  Off bicarb  Blood pressure of 90/63, saturating at 97%, heart rate of 92  60% FiO2, PEEP of 8, respiratory rate of 28   Patient arouses to stimulation but nonpurposeful  He does have minimal rhonchi S1-S2 appreciated Bowel sounds appreciated  New labs pending   At 1618 hrs. ABG 7.4 0/43/120/27 on 60% FiO2, rate of 28, tidal volume 510, PEEP of 8

## 2023-01-29 NOTE — Progress Notes (Signed)
  Spoke with Dr. Scharlene Corn at [facility]  Patient will be accepted to Dr Danielle Rankin service  Ordering Stat CT head, CMP, CBC, Lactate, ABG

## 2023-01-29 NOTE — Consult Note (Signed)
Date of Admission:  01/28/2023          Reason for Consult: MSSA bacteremia and concern for prosthetic aortic valve endocarditis with septic shock and multi organ failure   Referring Provider: [facility] auto consult and Dr. Aldean Ast   Assessment:  MSSA bacteremia with septic shock and multi organ failure Rule out PVE ? TTP Hx of CVA Hx of CM  Plan:  High dose nafcillin Will repeat blood cultures but given all of the lines that were placed in the setting of his septic shock he will ultimately need a line holiday if he survives He survives TEE should be considered along with probing for metastatic sites of infection Source of his MSSAB is not clear at this time  Principal Problem:   MSSA bacteremia Active Problems:   Sepsis (HCC)   TTP (thrombotic thrombocytopenic purpura) (HCC)   Scheduled Meds:  caplacizumab  11 mg Subcutaneous Daily   Chlorhexidine Gluconate Cloth  6 each Topical Q2000   docusate  100 mg Per Tube BID   ezetimibe  10 mg Per Tube Daily   hydrocortisone sod succinate (SOLU-CORTEF) inj  100 mg Intravenous Q8H   insulin aspart  0-6 Units Subcutaneous Q4H   midazolam       nafcillin  2 g Intravenous Q4H   mouth rinse  15 mL Mouth Rinse Q2H   pantoprazole (PROTONIX) IV  40 mg Intravenous Daily   polyethylene glycol  17 g Per Tube Daily   sodium bicarbonate       Continuous Infusions:  sodium chloride 250 mL (01/29/23 1136)   sodium chloride     epinephrine 5 mcg/min (01/29/23 1100)   fentaNYL infusion INTRAVENOUS 25 mcg/hr (01/29/23 1100)   norepinephrine (LEVOPHED) Adult infusion 70 mcg/min (01/29/23 1100)   prismasol BGK 4/2.5 1,500 mL/hr at 01/29/23 1223   sodium bicarbonate 150 mEq in sterile water 1,150 mL infusion 125 mL/hr at 01/29/23 1100   sodium bicarbonate 150 mEq in sterile water 1,150 mL infusion 500 mL/hr at 01/29/23 1058   sodium bicarbonate 150 mEq in sterile water 1,150 mL infusion 250 mL/hr at 01/29/23 0855   vasopressin 0.03  Units/min (01/29/23 1219)   PRN Meds:.Place/Maintain arterial line **AND** sodium chloride, [START ON 01/30/2023] acetaminophen **OR** [START ON 01/30/2023] acetaminophen (TYLENOL) oral liquid 160 mg/5 mL **OR** [START ON 01/30/2023] acetaminophen, fentaNYL, heparin, ipratropium-albuterol, midazolam, midazolam, mouth rinse, sodium bicarbonate, sodium chloride  HPI: [name] is a 62 y.o. male with past medical history significant for type 2 diabetes hypertension aortic valve replacement bio prosthetic aortic valve and coronary artery bypass grafting, chronic kidney disease was brought to the ER with worsening fatigue and lethargy confusion and a syncopal episode.  In the ER he was febrile and hypotensive.  Blood cultures were taken and he was started on broad-spectrum antibiotics.  He had acute renal failure with a creatinine up to 4.2 and metabolic acidosis and anemia with thrombocytopenia.  Peripheral smear was seen by Dr. Emeline Darling such who thought that this picture was consistent with schistocytosis with TTP.  Has had central lines placed and plasmapheresis is planned along with corticosteroids and cablivi.  In the interim he had worsening cognition and had a AAA arrest or, status post successful CPR resuscitation and intubation.  He is in the ICU on pressors.  His blood cultures are now positive for methicillin sensitive Staphylococcus aureus.  I am concerned he may very well have prosthetic valve endocarditis--though not seen on TTE suspicion  remains high.  I question whether he truly has TTP or whether MSSA bacteremia with PVEndocarditis is causing everything.  Would defer this to Hematology/Oncology and ICU team  I am changing him to nafcillin to optimize CNS penetration.  If he survives he will need a central line holiday and blood cultures taken after lines have been removed.  Ideally a transesophageal echocardiogram should be pursued but obviously not at this moment in time.  If he  survives and improves would query for metastatic sites of infection.  I have personally spent 84 minutes involved in face-to-face and non-face-to-face activities for this patient on the day of the visit. Professional time spent includes the following activities: Preparing to see the patient (review of tests), Obtaining and/or reviewing separately obtained history (admission/discharge record), Performing a medically appropriate examination and/or evaluation , Ordering medications/tests/procedures, referring and communicating with other health care professionals, Documenting clinical information in the EMR, Independently interpreting results (not separately reported), Communicating results to the patient/family/caregiver, Counseling and educating the patient/family/caregiver and Care coordination (not separately reported).      Review of Systems: Review of Systems  Unable to perform ROS: Critical illness    Past Medical History:  Diagnosis Date   Allergy    Anemia    Anxiety    Baker's cyst of knee    Blood transfusion without reported diagnosis    as baby    Coronary artery disease    quadruple bypass - March 2016   GERD (gastroesophageal reflux disease)    Gouty arthritis    "real bad" (01/17/2013)   Heart murmur    Hypercholesteremia    Hypertension    Myocardial infarction (HCC) 2017   Stroke (HCC)    Type II diabetes mellitus (HCC)     Social History   Tobacco Use   Smoking status: Never   Smokeless tobacco: Never  Vaping Use   Vaping Use: Never used  Substance Use Topics   Alcohol use: Not Currently    Comment: occasionally   Drug use: Yes    Types: Marijuana    Comment: pt states occasionally    Family History  Problem Relation Age of Onset   Diabetes Father    Hypertension Father    Hypertension Mother    Hypertension Sister    Hypertension Brother    Hypertension Brother    Hypertension Brother    Colon cancer Neg Hx    Esophageal cancer Neg Hx     Rectal cancer Neg Hx    Stomach cancer Neg Hx    Colon polyps Neg Hx    Allergies  Allergen Reactions   Other Anaphylaxis    mushrooms   Plavix [Clopidogrel Bisulfate] Other (See Comments)    TTP   Hydrocodone-Acetaminophen Itching    OBJECTIVE: Blood pressure 119/89, pulse 95, temperature (!) 97 F (36.1 C), resp. rate (!) 25, height 5\' 6"  (1.676 m), weight 96.6 kg, SpO2 100 %.  Physical Exam Constitutional:      Appearance: He is obese. He is toxic-appearing.     Interventions: He is intubated.  HENT:     Head: Normocephalic and atraumatic.  Eyes:     General:        Right eye: No discharge.        Left eye: No discharge.  Cardiovascular:     Rate and Rhythm: Tachycardia present.     Heart sounds: No murmur heard.    No friction rub. No gallop.  Pulmonary:  Effort: Tachypnea present. He is intubated.  Abdominal:     General: There is no distension.  Musculoskeletal:        General: Swelling present.     Right lower leg: Edema present.     Left lower leg: Edema present.     Lab Results Lab Results  Component Value Date   WBC 11.4 (H) 01/29/2023   HGB 13.6 01/29/2023   HCT 40.0 01/29/2023   MCV 107.3 (H) 01/29/2023   PLT 82 (L) 01/29/2023    Lab Results  Component Value Date   CREATININE 4.65 (H) 01/29/2023   BUN 54 (H) 01/29/2023   NA 135 01/29/2023   K 3.9 01/29/2023   CL 100 01/29/2023   CO2 16 (L) 01/29/2023    Lab Results  Component Value Date   ALT 35 01/29/2023   AST 190 (H) 01/29/2023   ALKPHOS 96 01/29/2023   BILITOT 2.6 (H) 01/29/2023     Microbiology: Recent Results (from the past 240 hour(s))  Blood Culture (routine x 2)     Status: None (Preliminary result)   Collection Time: 01/28/23 11:55 PM   Specimen: BLOOD  Result Value Ref Range Status   Specimen Description BLOOD SITE NOT SPECIFIED  Final   Special Requests   Final    BOTTLES DRAWN AEROBIC AND ANAEROBIC Blood Culture adequate volume   Culture  Setup Time   Final     GRAM POSITIVE COCCI IN CLUSTERS IN BOTH AEROBIC AND ANAEROBIC BOTTLES Organism ID to follow CRITICAL RESULT CALLED TO, READ BACK BY AND VERIFIED WITHRichelle Ito, AT 1136 01/29/23 Renato Shin Performed at [facility], [address], [place], [place] [address]    Culture GRAM POSITIVE COCCI IN CLUSTERS  Final   Report Status PENDING  Incomplete  Blood Culture (routine x 2)     Status: None (Preliminary result)   Collection Time: 01/28/23 11:55 PM   Specimen: BLOOD  Result Value Ref Range Status   Specimen Description BLOOD SITE NOT SPECIFIED  Final   Special Requests   Final    BOTTLES DRAWN AEROBIC AND ANAEROBIC Blood Culture adequate volume   Culture  Setup Time   Final    GRAM POSITIVE COCCI IN CLUSTERS IN BOTH AEROBIC AND ANAEROBIC BOTTLES Performed at [facility], [address] [place], [place] [address]    Culture GRAM POSITIVE COCCI IN CLUSTERS  Final   Report Status PENDING  Incomplete  Blood Culture ID Panel (Reflexed)     Status: Abnormal   Collection Time: 01/28/23 11:55 PM  Result Value Ref Range Status   Enterococcus faecalis NOT DETECTED NOT DETECTED Final   Enterococcus Faecium NOT DETECTED NOT DETECTED Final   Listeria monocytogenes NOT DETECTED NOT DETECTED Final   Staphylococcus species DETECTED (A) NOT DETECTED Final    Comment: CRITICAL RESULT CALLED TO, READ BACK BY AND VERIFIED WITH: H. VON DOHLEN, AT 1136 01/29/23 D. VANHOOK    Staphylococcus aureus (BCID) DETECTED (A) NOT DETECTED Final    Comment: CRITICAL RESULT CALLED TO, READ BACK BY AND VERIFIED WITH: H. VON DOHLEN, AT 1136 01/29/23 D. VANHOOK    Staphylococcus epidermidis NOT DETECTED NOT DETECTED Final   Staphylococcus lugdunensis NOT DETECTED NOT DETECTED Final   Streptococcus species NOT DETECTED NOT DETECTED Final   Streptococcus agalactiae NOT DETECTED NOT DETECTED Final   Streptococcus pneumoniae NOT DETECTED NOT DETECTED Final   Streptococcus pyogenes NOT DETECTED NOT  DETECTED Final   A.calcoaceticus-baumannii NOT DETECTED  NOT DETECTED Final   Bacteroides fragilis NOT DETECTED NOT DETECTED Final   Enterobacterales NOT DETECTED NOT DETECTED Final   Enterobacter cloacae complex NOT DETECTED NOT DETECTED Final   Escherichia coli NOT DETECTED NOT DETECTED Final   Klebsiella aerogenes NOT DETECTED NOT DETECTED Final   Klebsiella oxytoca NOT DETECTED NOT DETECTED Final   Klebsiella pneumoniae NOT DETECTED NOT DETECTED Final   Proteus species NOT DETECTED NOT DETECTED Final   Salmonella species NOT DETECTED NOT DETECTED Final   Serratia marcescens NOT DETECTED NOT DETECTED Final   Haemophilus influenzae NOT DETECTED NOT DETECTED Final   Neisseria meningitidis NOT DETECTED NOT DETECTED Final   Pseudomonas aeruginosa NOT DETECTED NOT DETECTED Final   Stenotrophomonas maltophilia NOT DETECTED NOT DETECTED Final   Candida albicans NOT DETECTED NOT DETECTED Final   Candida auris NOT DETECTED NOT DETECTED Final   Candida glabrata NOT DETECTED NOT DETECTED Final   Candida krusei NOT DETECTED NOT DETECTED Final   Candida parapsilosis NOT DETECTED NOT DETECTED Final   Candida tropicalis NOT DETECTED NOT DETECTED Final   Cryptococcus neoformans/gattii NOT DETECTED NOT DETECTED Final   Meth resistant mecA/C and MREJ NOT DETECTED NOT DETECTED Final    Comment: Performed at [facility], [address], [place], [place] [address]  Resp panel by RT-PCR (RSV, Flu A&B, Covid) Anterior Nasal Swab     Status: None   Collection Time: 01/29/23 12:21 AM   Specimen: Anterior Nasal Swab  Result Value Ref Range Status   SARS Coronavirus 2 by RT PCR NEGATIVE NEGATIVE Final   Influenza A by PCR NEGATIVE NEGATIVE Final   Influenza B by PCR NEGATIVE NEGATIVE Final    Comment: (NOTE) The Xpert Xpress SARS-CoV-2/FLU/RSV plus assay is intended as an aid in the diagnosis of influenza from Nasopharyngeal swab specimens and should not be used as a sole basis for treatment. Nasal  washings and aspirates are unacceptable for Xpert Xpress SARS-CoV-2/FLU/RSV testing.  Fact Sheet for Patients: BloggerCourse.com  Fact Sheet for Healthcare Providers: SeriousBroker.it  This test is not yet approved or cleared by the [place] FDA and has been authorized for detection and/or diagnosis of SARS-CoV-2 by FDA under an Emergency Use Authorization (EUA). This EUA will remain in effect (meaning this test can be used) for the duration of the COVID-19 declaration under Section 564(b)(1) of the Act, 21 U.S.C. section 360bbb-3(b)(1), unless the authorization is terminated or revoked.     Resp Syncytial Virus by PCR NEGATIVE NEGATIVE Final    Comment: (NOTE) Fact Sheet for Patients: BloggerCourse.com  Fact Sheet for Healthcare Providers: SeriousBroker.it  This test is not yet approved or cleared by the [place] FDA and has been authorized for detection and/or diagnosis of SARS-CoV-2 by FDA under an Emergency Use Authorization (EUA). This EUA will remain in effect (meaning this test can be used) for the duration of the COVID-19 declaration under Section 564(b)(1) of the Act, 21 U.S.C. section 360bbb-3(b)(1), unless the authorization is terminated or revoked.  Performed at [facility], [address] [place], [place] [address]   MRSA Next Gen by PCR, Nasal     Status: None   Collection Time: 01/29/23  3:48 AM   Specimen: Urine, Catheterized; Nasal Swab  Result Value Ref Range Status   MRSA by PCR Next Gen NOT DETECTED NOT DETECTED Final    Comment: (NOTE) The GeneXpert MRSA Assay (FDA approved for NASAL specimens only), is one component of a comprehensive MRSA colonization surveillance program. It  is not intended to diagnose MRSA infection nor to guide or monitor treatment for MRSA infections. Test performance is not FDA approved in patients less than 48  years old. Performed at [facility], [address] [place], [place] [address]   Urine Culture     Status: None (Preliminary result)   Collection Time: 01/29/23  7:45 AM   Specimen: Urine, Random  Result Value Ref Range Status   Specimen Description URINE, RANDOM  Final   Special Requests   Final    URINE, CLEAN CATCH Performed at [facility], [address], [place], [place] [address]    Culture PENDING  Incomplete   Report Status PENDING  Incomplete    Acey Lav, MD [facility] [phone] pager  01/29/2023, 12:39 PM

## 2023-01-29 NOTE — Procedures (Signed)
Arterial Catheter Insertion Procedure Note  [name]  [id number]  [birth year]  Date:01/29/23  Time:5:59 AM    Provider Performing: Donnelly Angelica    Procedure: Insertion of Arterial Line (40981) without US guidance  Indication(s) Blood pressure monitoring and/or need for frequent ABGs  Consent Unable to obtain consent due to emergent nature of procedure.  Anesthesia None   Time Out Verified patient identification, verified procedure, site/side was marked, verified correct patient position, special equipment/implants available, medications/allergies/relevant history reviewed, required imaging and test results available.   Sterile Technique Maximal sterile technique including full sterile barrier drape, hand hygiene, sterile gown, sterile gloves, mask, hair covering, sterile ultrasound probe cover (if used).   Procedure Description Area of catheter insertion was cleaned with chlorhexidine and draped in sterile fashion. Without real-time ultrasound guidance an arterial catheter was placed into the left radial artery.  Appropriate arterial tracings confirmed on monitor.     Complications/Tolerance None; patient tolerated the procedure well.   EBL Minimal   Specimen(s) None  Normal saline flush used. Patient tolerated well. Site dressed securely per RT protocol. Leveled and zeroed.

## 2023-01-29 NOTE — Progress Notes (Addendum)
MB called back and spoke to Nurse Pincus Sanes. Pt is being transferred to [facility] for Treatment and requested to cancel EEG order, It will no longer be needed at [facility] facility.

## 2023-01-29 NOTE — H&P (Signed)
See consult note written by Pia Mau, attested by Dr. Delton Coombes 01/29/2023 filed at 5:26 AM  Patient's history and physical

## 2023-01-29 NOTE — Consult Note (Signed)
[facility] CONSULT NOTE  Patient Care Team: Marcine Matar, MD as PCP - General (Internal Medicine) Nahser, Deloris Ping, MD as PCP - Cardiology (Cardiology)  ASSESSMENT & PLAN Acute thrombocytopenia, renal failure, hemolytic anemia, most consistent with TTP His overall presentation, review of peripheral blood smear, lab findings are consistent with TTP.  ADAMTS13 test has been ordered and pending I have reviewed the case with ICU team as well as nephrology team We will arrange for plasmapheresis as soon as feasible along with daily steroids, I recommend 7 days. Dr. Allena Katz will coordinate care with nephrology staff to perform plasmapheresis  I would also recommend prescribing Cablivi; I have reviewed this with pharmacy team.  We do not have this drug in the health system but that would be ordered urgently.  Plavix has been implicated as a potential cause for TTP  In my career as a hematologist for 17 years, this would be the third case of patients on Plavix with diagnosis of TTP.  I have entered Plavix as medication allergy.  Acute on chronic renal failure He had recent elevated serum creatinine a month ago due to other medical causes but now has severe renal failure likely due to TTP.  I would defer to nephrologist for dialysis.  It is not clear whether this is reversible or not but I am hopeful since his condition is diagnosed early, that his renal failure could still be reversible with aggressive treatment   Altered mental status status post intubation Likely due to TTP.  Cultures in progress to rule out infectious causes/sepsis  Severe anemia Transfuse per ICU protocol  History of coronary artery disease status post bypass, remote history of stroke As above, I have discontinued Plavix He will continue other risk factor management  Discharge planning Unknown, he would likely be here for 7 days or longer Dr. Leonides Schanz will be on-call for hematology service, and he will  round on the patient tomorrow.  I will return to check on the patient on Tuesday  I have also reviewed his case and updated his son, [name], significant other, [name] as well as his sister over the phone The total time spent in the appointment was 80 minutes encounter with patients including review of chart and various tests results, discussions about plan of care and coordination of care plan  Artis Delay, MD 01/29/2023 6:10 AM  CHIEF COMPLAINTS/PURPOSE OF CONSULTATION:  Suspect TTP  HISTORY OF PRESENTING ILLNESS:  [name] 62 y.o. male is seen at the request by ICU team for possible TTP. The patient was intubated by the time I rounded on him this morning.  His son and significant other present in the waiting room.  I have reviewed the case with ICU team as well as nephrologist this morning I have reviewed his chart extensively The patient have significant background history of coronary artery disease, stroke, diabetes, hypertension, hyperlipidemia, chronic kidney disease and others.  The patient has been on Plavix for a very long time, since 2019 after presentation with stroke. Approximately a month ago, he was noted to have acute on chronic renal failure and was referred to cardiologist for follow-up.  He also have recent acute gout. Yesterday, he was brought into the emergency department.  His significant other noticed that the patient have acute confusion, shortness of breath, diaphoresis as well as near syncopal episode Soon after arrival to the ER, he has rapid deterioration with respiratory failure, profound abnormal labs, and altered mental status.  He was intubated.  There were reported history of fever.  Sepsis was also was in the differential and significant cultures were drawn. His baseline CBC from August 12, 2022 showed hemoglobin of 10.2, platelet count of 252 and creatinine of 1.94 Late last night, his white count was 9.6, hemoglobin was 6.8, MCV was high at 115.7 and platelet  count of 121.  Complete metabolic panel revealed creatinine of 4.24 with elevated serum bilirubin of 1.9.  His PT APTT and INR were also abnormal.  Fibrinogen level was adequate.  He is noted to have elevated D-dimer.  LDH was elevated at 597 Schistocytes were noted on peripheral smear.  Diagnosis of TTP was suspected.  The ICU team placed central line urgently. There were no reported signs of bleeding by the nursing staff  His past medical history, past surgical history, social history and family history were based on chart review.  Review of systems were not possible as the patient was sedated, intubated and ventilated  MEDICAL HISTORY:  Past Medical History:  Diagnosis Date   Allergy    Anemia    Anxiety    Baker's cyst of knee    Blood transfusion without reported diagnosis    as baby    Coronary artery disease    quadruple bypass - March 2016   GERD (gastroesophageal reflux disease)    Gouty arthritis    "real bad" (01/17/2013)   Heart murmur    Hypercholesteremia    Hypertension    Myocardial infarction (HCC) 2017   Stroke (HCC)    Type II diabetes mellitus (HCC)     SURGICAL HISTORY: Past Surgical History:  Procedure Laterality Date   AORTIC VALVE REPLACEMENT N/A 11/25/2015   Procedure: AORTIC VALVE REPLACEMENT (AVR) USING A MAGNA EASE ;  Surgeon: Alleen Borne, MD;  Location: [facility] OR;  Service: Open Heart Surgery;  Laterality: N/A;   CARDIAC CATHETERIZATION N/A 11/19/2015   Procedure: Right/Left Heart Cath and Coronary Angiography;  Surgeon: Lyn Records, MD;  Location: [facility] INVASIVE CV LAB;  Service: Cardiovascular;  Laterality: N/A;   COLONOSCOPY  2004   CORONARY ARTERY BYPASS GRAFT N/A 11/25/2015   Procedure: CORONARY ARTERY BYPASS GRAFTING (CABG)x4 LIMA-LAD; SVG-OM; SVG-RCA; SVG-DIAG;  Surgeon: Alleen Borne, MD;  Location: [facility] OR;  Service: Open Heart Surgery;  Laterality: N/A;   CYST REMOVAL TRUNK N/A 08/24/2016   Procedure: EXCISION OF BACK ABSCESS;  Surgeon:  Jimmye Norman, MD;  Location: [facility];  Service: General;  Laterality: N/A;   FINGER AMPUTATION Right 1980's   3rd & 4th digits "got them mashed" (01/17/2013)   KNEE ARTHROSCOPY Right 1980's   MULTIPLE EXTRACTIONS WITH ALVEOLOPLASTY Bilateral 11/21/2015   Procedure: Extraction of tooth #'s 2,12 with alveoloplasty and gross debridement of remaining dentition;  Surgeon: Charlynne Pander, DDS;  Location: [facility] OR;  Service: Oral Surgery;  Laterality: Bilateral;   TEE WITHOUT CARDIOVERSION N/A 11/20/2015   Procedure: TRANSESOPHAGEAL ECHOCARDIOGRAM (TEE);  Surgeon: Laurey Morale, MD;  Location: [facility] ENDOSCOPY;  Service: Cardiovascular;  Laterality: N/A;   TEE WITHOUT CARDIOVERSION N/A 11/25/2015   Procedure: TRANSESOPHAGEAL ECHOCARDIOGRAM (TEE);  Surgeon: Alleen Borne, MD;  Location: [facility] OR;  Service: Open Heart Surgery;  Laterality: N/A;    SOCIAL HISTORY: Social History   Socioeconomic History   Marital status: Married    Spouse name: Not on file   Number of children: Not on file   Years of education: Not on file   Highest education level: Not on file  Occupational History   Not on file  Tobacco Use   Smoking status: Never   Smokeless tobacco: Never  Vaping Use   Vaping Use: Never used  Substance and Sexual Activity   Alcohol use: Not Currently    Comment: occasionally   Drug use: Yes    Types: Marijuana    Comment: pt states occasionally   Sexual activity: Yes  Other Topics Concern   Not on file  Social History Narrative   Not on file   Social Determinants of Health   Financial Resource Strain: Low Risk  (12/09/2022)   Overall Financial Resource Strain (CARDIA)    Difficulty of Paying Living Expenses: Not hard at all  Food Insecurity: No Food Insecurity (12/09/2022)   Hunger Vital Sign    Worried About Running Out of Food in the Last Year: Never true    Ran Out of Food in the Last Year: Never true  Transportation Needs: No Transportation Needs (12/09/2022)    PRAPARE - Administrator, Civil Service (Medical): No    Lack of Transportation (Non-Medical): No  Physical Activity: Inactive (12/09/2022)   Exercise Vital Sign    Days of Exercise per Week: 0 days    Minutes of Exercise per Session: 0 min  Stress: No Stress Concern Present (12/09/2022)   [employer] of Occupational Health - Occupational Stress Questionnaire    Feeling of Stress : Not at all  Social Connections: Socially Isolated (11/11/2022)   Social Connection and Isolation Panel [NHANES]    Frequency of Communication with Friends and Family: Never    Frequency of Social Gatherings with Friends and Family: Never    Attends Religious Services: Never    Database administrator or Organizations: No    Attends Banker Meetings: Never    Marital Status: Widowed  Catering manager Violence: Not on file    FAMILY HISTORY: Family History  Problem Relation Age of Onset   Diabetes Father    Hypertension Father    Hypertension Mother    Hypertension Sister    Hypertension Brother    Hypertension Brother    Hypertension Brother    Colon cancer Neg Hx    Esophageal cancer Neg Hx    Rectal cancer Neg Hx    Stomach cancer Neg Hx    Colon polyps Neg Hx     ALLERGIES:  is allergic to other and hydrocodone-acetaminophen.  MEDICATIONS:  Current Facility-Administered Medications  Medication Dose Route Frequency Provider Last Rate Last Admin   0.9 %  sodium chloride infusion (Manually program via Guardrails IV Fluids)   Intravenous Once Pia Mau D, PA-C       0.9 %  sodium chloride infusion  250 mL Intravenous Continuous Byrum, Les Pou, MD       0.9 %  sodium chloride infusion   Intra-arterial PRN Leslye Peer, MD       [START ON 01/30/2023] acetaminophen (TYLENOL) tablet 650 mg  650 mg Oral Q4H PRN Lidia Collum, PA-C       Or   [START ON 01/30/2023] acetaminophen (TYLENOL) 160 MG/5ML solution 650 mg  650 mg Per Tube Q4H PRN Lidia Collum, PA-C       Or    [START ON 01/30/2023] acetaminophen (TYLENOL) suppository 650 mg  650 mg Rectal Q4H PRN Lidia Collum, PA-C       atorvastatin (LIPITOR) tablet 80 mg  80 mg Per Tube q1800 Lidia Collum, PA-C  ceFEPIme (MAXIPIME) 2 g in sodium chloride 0.9 % 100 mL IVPB  2 g Intravenous Q12H Bryk, Veronda P, RPH       Chlorhexidine Gluconate Cloth 2 % PADS 6 each  6 each Topical Q2000 Leslye Peer, MD   6 each at 01/29/23 0359   docusate (COLACE) 50 MG/5ML liquid 100 mg  100 mg Per Tube BID Lidia Collum, PA-C       EPINEPHrine (ADRENALIN) 5 mg in NS 250 mL (0.02 mg/mL) premix infusion  0.5-20 mcg/min Intravenous Titrated Pia Mau D, PA-C       ezetimibe (ZETIA) tablet 10 mg  10 mg Per Tube Daily Pia Mau D, PA-C       fentaNYL (SUBLIMAZE) bolus via infusion 50-100 mcg  50-100 mcg Intravenous Q15 min PRN Pia Mau D, PA-C       fentaNYL in NS (36mcg/ml) infusion-PREMIX  50-200 mcg/hr Intravenous Continuous Pia Mau D, PA-C 2.5 mL/hr at 01/29/23 0324 25 mcg/hr at 01/29/23 0324   heparin injection 1,000-6,000 Units  1,000-6,000 Units CRRT PRN Lidia Collum, PA-C       insulin aspart (novoLOG) injection 0-6 Units  0-6 Units Subcutaneous Q4H Pia Mau D, PA-C       ipratropium-albuterol (DUONEB) 0.5-2.5 (3) MG/3ML nebulizer solution 3 mL  3 mL Nebulization Q4H PRN Lidia Collum, PA-C       lactated ringers infusion   Intravenous Continuous Mesner, Barbara Cower, MD 150 mL/hr at 01/29/23 0012 New Bag at 01/29/23 0012   magnesium sulfate IVPB 2 g 50 mL  2 g Intravenous Once Lidia Collum, PA-C       methylPREDNISolone sodium succinate (SOLU-MEDROL) 125 mg/2 mL injection 125 mg  125 mg Intravenous Daily Pia Mau D, PA-C       midazolam (VERSED) 2 MG/2ML injection            midazolam (VERSED) injection 2 mg  2 mg Intravenous Q1H PRN Paliwal, Aditya, MD       norepinephrine (LEVOPHED) 16 mg in (0.064 mg/mL) premix infusion  0-80 mcg/min Intravenous Titrated Leslye Peer, MD       Oral  care mouth rinse  15 mL Mouth Rinse Q2H Byrum, Les Pou, MD       Oral care mouth rinse  15 mL Mouth Rinse PRN Leslye Peer, MD       pantoprazole (PROTONIX) injection 40 mg  40 mg Intravenous Daily Pia Mau D, PA-C       polyethylene glycol (MIRALAX / GLYCOLAX) packet 17 g  17 g Per Tube Daily Pia Mau D, PA-C       potassium chloride (KLOR-CON) packet 20 mEq  20 mEq Per Tube Once Lidia Collum, PA-C       prismasol BGK 4/2.5 infusion   CRRT Continuous Zetta Bills, MD       sodium bicarbonate 1 mEq/mL injection            sodium bicarbonate 150 mEq in sterile water 1,150 mL infusion   Intravenous Continuous Lidia Collum, PA-C 150 mL/hr at 01/29/23 0542 Rate Change at 01/29/23 0542   sodium bicarbonate 150 mEq in sterile water 1,150 mL infusion   CRRT Continuous Zetta Bills, MD       sodium bicarbonate 150 mEq in sterile water 1,150 mL infusion   CRRT Continuous Zetta Bills, MD       sodium chloride 0.9 % primer fluid for CRRT   CRRT PRN Allena Katz,  Vonna Kotyk, MD       [name] ON 01/30/2023] vancomycin (VANCOCIN) IVPB 1000 mg/200 mL premix  1,000 mg Intravenous Q24H Bryk, Veronda P, RPH       vancomycin variable dose per unstable renal function (pharmacist dosing)   Does not apply See admin instructions Juliette Mangle, RPH       vasopressin (PITRESSIN) 20 Units in sodium chloride 0.9 % 100 mL infusion-*FOR SHOCK*  0-0.03 Units/min Intravenous Continuous Pia Mau D, PA-C 9 mL/hr at 01/29/23 0521 0.03 Units/min at 01/29/23 0521   PHYSICAL EXAMINATION: ECOG PERFORMANCE STATUS: 4 - Bedbound  Vitals:   01/29/23 0417 01/29/23 0554  BP:  93/68  Pulse:  97  Resp:  20  Temp:  (!) 96.4 F (35.8 C)  SpO2: 92% 97%   Filed Weights   01/29/23 0000  Weight: 213 lb (96.6 kg)    GENERAL: The patient is sedated, intubated and ventilated SKIN: skin color, texture, turgor are normal, no rashes or significant lesions.  Did not appreciate any ecchymosis or petechiae EYES: Closed OROPHARYNX: ET tube in  situ NECK: supple, thyroid normal size, non-tender, without nodularity LYMPH:  no palpable lymphadenopathy in the cervical, axillary or inguinal LUNGS: clear to auscultation and percussion with normal breathing effort HEART: regular rate & rhythm and no murmurs and no lower extremity edema ABDOMEN:abdomen soft, non-tender and normal bowel sounds Musculoskeletal:no cyanosis of digits and no clubbing  PSYCH: Unable to assess NEURO: Unable to assess  LABORATORY DATA:  I have reviewed the data as listed Lab Results  Component Value Date   WBC 11.4 (H) 01/29/2023   HGB 7.8 (L) 01/29/2023   HCT 23.0 (L) 01/29/2023   MCV 107.3 (H) 01/29/2023   PLT 82 (L) 01/29/2023   I personally reviewed his peripheral blood smear.  There were schistocytes present but not in an abundance.  No evidence of platelet clumping.  Normal white blood cell morphology.  Polychromasia noted.  Occasional nucleated RBC were noted  RADIOGRAPHIC STUDIES: I have personally reviewed the radiological images as listed and agreed with the findings in the report. DG CHEST PORT 1 VIEW  Result Date: 01/29/2023 CLINICAL DATA:  Central line placement. EXAM: PORTABLE CHEST 1 VIEW COMPARISON:  Earlier same day FINDINGS: 0508 hours. Endotracheal tube tip is 2 cm above the base of the carina. The NG tube passes into the stomach although the distal tip position is not included on the film. Left IJ central line tip overlies the right upper mediastinum and venous positioning cannot be confirmed on this image. No pneumothorax or pleural effusion. The cardio pericardial silhouette is enlarged. Patchy parahilar opacity is noted bilaterally. Diffuse interstitial opacity suggests edema. Telemetry leads overlie the chest. IMPRESSION: 1. Left IJ central line tip overlies the right upper mediastinum in the region of the innominate vein confluence. Venous positioning cannot be confirmed on this image. 2. Endotracheal tube tip is 2 cm above the base of  the carina. 3. Cardiomegaly with patchy parahilar opacity. Pneumonia not excluded. Diffuse interstitial opacity suggests edema. Electronically Signed   By: Kennith Center M.D.   On: 01/29/2023 05:28   DG Abd 1 View  Result Date: 01/29/2023 CLINICAL DATA:  Orogastric tube placement. EXAM: ABDOMEN - 1 VIEW COMPARISON:  None Available. FINDINGS: An enteric tube is seen with its distal tip overlying the expected region of the body of the stomach. Its distal side hole is approximate 3.3 cm distal to the expected region of the gastroesophageal junction. The bowel gas pattern is  normal. No radio-opaque calculi or other significant radiographic abnormality are seen. IMPRESSION: Enteric tube positioning, as described above. Electronically Signed   By: Aram Candela M.D.   On: 01/29/2023 03:49   DG Chest Port 1 View  Result Date: 01/29/2023 CLINICAL DATA:  Status post intubation. EXAM: PORTABLE CHEST 1 VIEW COMPARISON:  Jan 29, 2023 (12:19 a.m.) FINDINGS: Since the prior study there is been interval endotracheal tube placement with its distal tip approximately 3.1 cm from the carina. Interval enteric tube placement is also seen with its distal end extending below the level of the diaphragm. Multiple sternal wires are noted. The cardiac silhouette is mildly enlarged and unchanged in size. An artificial aortic valve is seen. Low lung volumes are seen with mild left suprahilar and left infrahilar atelectasis and/or infiltrate. This represents a new finding. No pleural effusion or pneumothorax is identified. The visualized skeletal structures are unremarkable. IMPRESSION: 1. Interval endotracheal tube placement and enteric tube placement, as described above. 2. Low lung volumes with mild left suprahilar and left infrahilar atelectasis and/or infiltrate. Electronically Signed   By: Aram Candela M.D.   On: 01/29/2023 03:48   DG Chest Port 1 View  Result Date: 01/29/2023 CLINICAL DATA:  Altered mental status and  tachypnea. EXAM: PORTABLE CHEST 1 VIEW COMPARISON:  October 11, 2022 FINDINGS: Multiple sternal wires are seen. The cardiac silhouette is mildly enlarged, likely technical in origin. An artificial aortic valve is seen. Low lung volumes are noted. Both lungs are clear. Multilevel degenerative changes are seen throughout the thoracic spine. IMPRESSION: 1. Evidence of prior median sternotomy/CABG. 2. Low lung volumes without acute cardiopulmonary disease. Electronically Signed   By: Aram Candela M.D.   On: 01/29/2023 00:41   ECHOCARDIOGRAM COMPLETE  Result Date: 01/26/2023    ECHOCARDIOGRAM REPORT   Patient Name:   ZOE HONN Date of Exam: 01/26/2023 Medical Rec #:  [id number]     Height:       66.0 in Accession #:    [id number]    Weight:       213.0 lb Date of Birth:  01/27/1961      BSA:          2.054 m Patient Age:    62 years      BP:           128/82 mmHg Patient Gender: M             HR:           71 bpm. Exam Location:  [address] Procedure: 2D Echo, Cardiac Doppler, Color Doppler and Intracardiac            Opacification Agent Indications:     Z95.2 S/P Aortic valve replacement (23mm Magna Ease)  History:         Patient has prior history of Echocardiogram examinations, most                  recent 04/11/2018. CAD and Previous Myocardial Infarction, Prior                  CABG, TIA and Stroke, Signs/Symptoms:Murmur; Risk                  Factors:Hypertension, Diabetes, Dyslipidemia and Sleep Apnea.                  Bicuspid aortic valve. First degree AV block.  Aortic Valve: 23 mm [facility] Ease valve is present in the                  aortic position.  Sonographer:     Cathie Beams RCS Referring Phys:  [phone] Devoria Albe DICK Diagnosing Phys: Epifanio Lesches MD IMPRESSIONS  1. Left ventricular ejection fraction, by estimation, is 65 to 70%. The left ventricle has normal function. The left ventricle has no regional wall motion abnormalities. There is mild left ventricular  hypertrophy. Left ventricular diastolic parameters were normal.  2. Right ventricular systolic function was not well visualized. The right ventricular size is not well visualized.  3. Left atrial size was mildly dilated.  4. The mitral valve is normal in structure. No evidence of mitral valve regurgitation.  5. The aortic valve has been repaired/replaced. Aortic valve regurgitation is not visualized. There is a 23 mm [employer] valve present in the aortic position. Echo findings are consistent with normal structure and function of the aortic valve prosthesis. Aortic valve mean gradient measures 11.0 mmHg.  6. The inferior vena cava is normal in size with greater than 50% respiratory variability, suggesting right atrial pressure of 3 mmHg. FINDINGS  Left Ventricle: Left ventricular ejection fraction, by estimation, is 65 to 70%. The left ventricle has normal function. The left ventricle has no regional wall motion abnormalities. The left ventricular internal cavity size was normal in size. There is  mild left ventricular hypertrophy. Left ventricular diastolic parameters were normal. Right Ventricle: The right ventricular size is not well visualized. Right vetricular wall thickness was not well visualized. Right ventricular systolic function was not well visualized. Left Atrium: Left atrial size was mildly dilated. Right Atrium: Right atrial size was normal in size. Pericardium: There is no evidence of pericardial effusion. Mitral Valve: The mitral valve is normal in structure. No evidence of mitral valve regurgitation. Tricuspid Valve: The tricuspid valve is normal in structure. Tricuspid valve regurgitation is trivial. Aortic Valve: The aortic valve has been repaired/replaced. Aortic valve regurgitation is not visualized. Aortic valve mean gradient measures 11.0 mmHg. Aortic valve peak gradient measures 24.2 mmHg. There is a 23 mm [employer] valve present in the aortic position. Echo findings are  consistent with normal structure and function of the aortic valve prosthesis. Pulmonic Valve: The pulmonic valve was grossly normal. Pulmonic valve regurgitation is not visualized. Aorta: The aortic root and ascending aorta are structurally normal, with no evidence of dilitation. Venous: The inferior vena cava is normal in size with greater than 50% respiratory variability, suggesting right atrial pressure of 3 mmHg. IAS/Shunts: The interatrial septum was not well visualized.  LEFT VENTRICLE PLAX 2D LVIDd:         4.00 cm Diastology LVIDs:         2.60 cm LV e' medial:    8.16 cm/s LV PW:         1.10 cm LV E/e' medial:  16.3 LV IVS:        1.10 cm LV e' lateral:   11.70 cm/s                        LV E/e' lateral: 11.4  RIGHT VENTRICLE RV Basal diam:  4.20 cm RV S prime:     6.53 cm/s TAPSE (M-mode): 1.5 cm RVSP:           26.0 mmHg LEFT ATRIUM  Index        RIGHT ATRIUM           Index LA diam:        4.20 cm  2.04 cm/m   RA Pressure: 3.00 mmHg LA Vol (A2C):   46.7 ml  22.74 ml/m  RA Area:     15.80 cm LA Vol (A4C):   102.0 ml 49.66 ml/m  RA Volume:   36.30 ml  17.67 ml/m LA Biplane Vol: 76.3 ml  37.15 ml/m  AORTIC VALVE AV Vmax:           246.00 cm/s AV Vmean:          148.000 cm/s AV VTI:            0.471 m AV Peak Grad:      24.2 mmHg AV Mean Grad:      11.0 mmHg LVOT Vmax:         116.00 cm/s LVOT Vmean:        71.500 cm/s LVOT VTI:          0.238 m LVOT/AV VTI ratio: 0.51  AORTA Ao Root diam: 3.70 cm Ao Asc diam:  3.70 cm MITRAL VALVE                TRICUSPID VALVE MV Area (PHT): 4.06 cm     TR Peak grad:   23.0 mmHg MV Decel Time: 187 msec     TR Vmax:        240.00 cm/s MV E velocity: 133.00 cm/s  Estimated RAP:  3.00 mmHg MV A velocity: 69.70 cm/s   RVSP:           26.0 mmHg MV E/A ratio:  1.91                             SHUNTS                             Systemic VTI: 0.24 m Epifanio Lesches MD Electronically signed by Epifanio Lesches MD Signature Date/Time: 01/26/2023/12:53:47  PM    Final (Updated)

## 2023-01-29 NOTE — Progress Notes (Signed)
Pt being followed by [facility] for Sepsis protocol. 

## 2023-01-29 NOTE — ED Notes (Addendum)
Pt seen by intensivest. Chased him down shortly after he left for rapid decline in patient condition. Patient began to brady down and lost his respiratory drive as well as desat to the 28s. He was pulseless and in PEA at 0257 and CPR was started. Epi 0.3 mg was given IV at 0300, and intubated at 0303, 24 at the teeth. IO placed in left shoulder by paramedic at 0303. Full dose of epi given again at 0304 in left IO. 1 amp of bicarb given at 0304 via IO. Carotid pulse check at 0305 was palpable. CPR stopped. Patient hooked up to vent and ICU provider gave orders.  No shock administered.

## 2023-01-29 NOTE — Procedures (Signed)
Central Venous Catheter Insertion Procedure Note  [name]  [id number]  Jun 30, 1961  Date:01/29/23  Time:5:10 AM   Provider Performing:Courtnay Petrilla S Tabbitha Janvrin   Procedure: Insertion of Non-tunneled Central Venous Catheter(36556)with US guidance (40981)    Indication(s) Hemodialysis and plasmaphoresis  Consent Unable to obtain consent due to emergent nature of procedure.  Anesthesia Topical only with 1% lidocaine   Timeout Verified patient identification, verified procedure, site/side was marked, verified correct patient position, special equipment/implants available, medications/allergies/relevant history reviewed, required imaging and test results available.  Sterile Technique Maximal sterile technique including full sterile barrier drape, hand hygiene, sterile gown, sterile gloves, mask, hair covering, sterile ultrasound probe cover (if used).  Procedure Description Area of catheter insertion was cleaned with chlorhexidine and draped in sterile fashion.   With real-time ultrasound guidance a HD catheter was placed into the left internal jugular vein.  Nonpulsatile blood flow and easy flushing noted in all ports.  The catheter was sutured in place and sterile dressing applied.  Complications/Tolerance None; patient tolerated the procedure well. Chest X-ray is ordered to verify placement for internal jugular or subclavian cannulation.  Chest x-ray is not ordered for femoral cannulation.  EBL Minimal  Specimen(s) None  Levy Pupa, MD, PhD 01/29/2023, 5:10 AM [facility] [phone] or if no answer before 7:00PM call [phone] For any issues after 7:00PM please call eLink [phone]

## 2023-01-29 NOTE — Progress Notes (Signed)
Informed family about the possible need to transfer based on need for plasmapheresis  [facility], waiting for callback

## 2023-01-29 NOTE — Progress Notes (Signed)
Report called to [facility] life flight transport team and receiving ICU nurse

## 2023-01-29 NOTE — Consult Note (Signed)
NAME:  [name], MRN:  [id number], DOB:  1960/10/24, LOS: 0 ADMISSION DATE:  01/28/2023, CONSULTATION DATE:  5/25 REFERRING MD:  Dr. Clayborne Dana, CHIEF COMPLAINT:  AKI and AMS   History of Present Illness:  Patient is [name] w/ pertinent PMH CAD s/p cabg, HTN, HLD, DMT2, CKD3, CVA, CKD 3b presents to [facility] on 5/25 w/ AMS.  Per patient's wife over on 5/23 patient feeling more fatigued and had generalized weakness. On 5/24 patient becoming more confused and tachypnea. Patient then had a syncopal episdoe w/ some possible myoclonic jerking and EMS was called and transferred patient to [facility].  Upon arrival patient confused but able to state name. Tmax 100.6 F and soft BP. Patient tachypneic in 40s and diaphoretic. IV fluids given, cultures obtained, and started on broad spectrum abx. LA 4.5. CXR w/ possible Left infrahilar atelectasis vs. Infiltrate. Covid/flu negative. Hgb 6.8 and platelets 121. Patient and wife denies any bloody/dark bowel mvts. Transfused 1 unit. K 3.0, creat 4.24, bun 51, co2 11, ag 16. INR 2.3, PT 25.1, ptt 69. DIC panel w/ normal fibrinogen and schistocytes present. VBG 7.32, 21, 121, 11 initially. Per nurse patient was confused moving around in bed and trying to get out of bed. Patient required ativan for delirium. About an hour later patient mental status worsened and patient began to desat. Patient required to be bagged and continued to brady down and quickly lost pulse. PEA arrest and cpr started. Patient given epi and bicarb. Intuated. ROSC in 8 minutes. Post arrest hypotensive and on levo. PCCM consulted for icu admission.  Pertinent  Medical History   Past Medical History:  Diagnosis Date   Allergy    Anemia    Anxiety    Baker's cyst of knee    Blood transfusion without reported diagnosis    as baby    Coronary artery disease    quadruple bypass - March 2016   GERD (gastroesophageal reflux disease)    Gouty arthritis    "real bad" (01/17/2013)   Heart murmur     Hypercholesteremia    Hypertension    Myocardial infarction (HCC) 2017   Stroke (HCC)    Type II diabetes mellitus (HCC)      Significant Hospital Events: Including procedures, antibiotic start and stop dates in addition to other pertinent events   5/25 admitted to [facility] w/ likely TTP; patient in resp distress and ended up PEA arrest; rosc 8 minutes  Interim History / Subjective:  See above  Objective   Blood pressure (!) 86/66, pulse (!) 31, temperature (!) 100.6 F (38.1 C), temperature source Rectal, resp. rate (!) 37, height 5\' 6"  (1.676 m), weight 96.6 kg, SpO2 (!) 89 %.       No intake or output data in the 24 hours ending 01/29/23 0217 Filed Weights   01/29/23 0000  Weight: 96.6 kg    Examination: General:  critically ill appearing on mech vent HEENT: MM pink/moist; ETT in place Neuro: (prior to code was confused and moving all extremities spontaneously; able to state name); Post cpr unresponsive; PERRL CV: s1s2, RRR, no m/r/g PULM:  dim clear BS bilaterally; on mech vent PRVC GI: soft, bsx4 active  Extremities: warm/dry, no edema  Skin: no rashes or lesions    Resolved Hospital Problem list     Assessment & Plan:   Cardiac arrest: Patient with worsening altered mental status and desaturation. Patient being bagged and respiratory arrested. PEA. ROSC 8 minutes. Septic shock: unclear  source of infection; possible left infrahilar infiltrate. P: -will admit to ICU w/ continuous telemetry monitoring -continue levo and will add vaso for MAP goal >65 -check CVP; consider holding IV fluids -transfuse 1 unit PRBC; f/u cbc post transfusion -trend troponin and lactate -abg w/ worsening acidosis; will start bicarb drip -replete K w/ mag; check mag and bmp -echo -check cultures: bcx2, urine culture/UA, and trach culture -cont broad spectrum antibiotics -check procalcitonin -TTM normothermia protocol in place  Acute respiratory failure w/ hypercapnia P: -LTVV  strategy with tidal volumes of 6-8 cc/kg ideal body weight -check ABG and adjust settings accordingly -Wean PEEP/FiO2 for SpO2 >92% -VAP bundle in place -Daily SAT and SBT -PAD protocol in place -wean sedation for RASS goal 0 to -1  TTP -dic panel showing schistocytes; patient w/ anemia, fever, thrombocytopenia, aki, neuro change. Patient on plavix at home. P: -heme/onc and nephro consulted -will place HD and start on plasmapheresis -check ADAMST13 -IV steroids -given 1 unit prbc -repeat cbc post transfusion  Acute encephalopathy: likely TTP related Possible syncopal episode w/ myoclonic jerking? P: -check ct head -check UDS, ethanol, ammonia -check eeg -limit sedating meds  AKI on CKD 3b AGMA Hypokalemia Mild hyponatremia: likely dehydration P: -Trend BMP / urinary output -Replace electrolytes as indicated -Avoid nephrotoxic agents, ensure adequate renal perfusion  Elevated AST P: -trend cmp  DMT2 P: -ssi and cgg monitoring  HTN HLD CAD s/p CABG H/o AVR P: -Hold anti-hypertensive's -cont zetia and statin -hold plavix in setting of TTP  Hx of CVA P: -hold plavix in setting of TTP -resume statin  Hx of Gout P: -hold home meds for now   Best Practice (right click and "Reselect all SmartList Selections" daily)   Diet/type: NPO w/ meds via tube DVT prophylaxis: SCD GI prophylaxis: PPI Lines: N/A Foley:  N/A Code Status:  full code Last date of multidisciplinary goals of care discussion [5/25 updated wife at bedside]  Labs   CBC: Recent Labs  Lab 01/28/23 2355 01/29/23 0122  WBC 9.6  --   NEUTROABS 8.0*  --   HGB 6.8* 8.2*  HCT 22.9* 24.0*  MCV 115.7*  --   PLT 121*  --     Basic Metabolic Panel: Recent Labs  Lab 01/28/23 2355 01/29/23 0122  NA 134* 138  K 3.0* 3.4*  CL 107  --   CO2 11*  --   GLUCOSE 89  --   BUN 51*  --   CREATININE 4.24*  --   CALCIUM 8.9  --    GFR: Estimated Creatinine Clearance: 19.4 mL/min (A) (by  C-G formula based on SCr of 4.24 mg/dL (H)). Recent Labs  Lab 01/28/23 2355  WBC 9.6  LATICACIDVEN 4.5*    Liver Function Tests: Recent Labs  Lab 01/28/23 2355  AST 134*  ALT 25  ALKPHOS 101  BILITOT 1.9*  PROT 7.0  ALBUMIN 2.9*   No results for input(s): "LIPASE", "AMYLASE" in the last 168 hours. No results for input(s): "AMMONIA" in the last 168 hours.  ABG    Component Value Date/Time   PHART 7.370 11/25/2015 2357   PCO2ART 43.8 11/25/2015 2357   PO2ART 65.0 (L) 11/25/2015 2357   HCO3 11.2 (L) 01/29/2023 0122   TCO2 12 (L) 01/29/2023 0122   ACIDBASEDEF 13.0 (H) 01/29/2023 0122   O2SAT 99 01/29/2023 0122     Coagulation Profile: Recent Labs  Lab 01/28/23 2355  INR 2.3*    Cardiac Enzymes: No results for input(s): "CKTOTAL", "CKMB", "  CKMBINDEX", "TROPONINI" in the last 168 hours.  HbA1C: HbA1c, POC (controlled diabetic range)  Date/Time Value Ref Range Status  12/13/2022 09:58 AM 5.9 0.0 - 7.0 % Final  08/12/2022 08:49 AM 5.8 0.0 - 7.0 % Final    CBG: Recent Labs  Lab 01/29/23 0011  GLUCAP 92    Review of Systems:   Patient is encephalopathic and/or intubated; therefore, history has been obtained from chart review.    Past Medical History:  He,  has a past medical history of Allergy, Anemia, Anxiety, Baker's cyst of knee, Blood transfusion without reported diagnosis, Coronary artery disease, GERD (gastroesophageal reflux disease), Gouty arthritis, Heart murmur, Hypercholesteremia, Hypertension, Myocardial infarction (HCC) (2017), Stroke (HCC), and Type II diabetes mellitus (HCC).   Surgical History:   Past Surgical History:  Procedure Laterality Date   AORTIC VALVE REPLACEMENT N/A 11/25/2015   Procedure: AORTIC VALVE REPLACEMENT (AVR) USING A MAGNA EASE ;  Surgeon: Alleen Borne, MD;  Location: [facility] OR;  Service: Open Heart Surgery;  Laterality: N/A;   CARDIAC CATHETERIZATION N/A 11/19/2015   Procedure: Right/Left Heart Cath and Coronary  Angiography;  Surgeon: Lyn Records, MD;  Location: [facility] INVASIVE CV LAB;  Service: Cardiovascular;  Laterality: N/A;   COLONOSCOPY  2004   CORONARY ARTERY BYPASS GRAFT N/A 11/25/2015   Procedure: CORONARY ARTERY BYPASS GRAFTING (CABG)x4 LIMA-LAD; SVG-OM; SVG-RCA; SVG-DIAG;  Surgeon: Alleen Borne, MD;  Location: [facility] OR;  Service: Open Heart Surgery;  Laterality: N/A;   CYST REMOVAL TRUNK N/A 08/24/2016   Procedure: EXCISION OF BACK ABSCESS;  Surgeon: Jimmye Norman, MD;  Location: [facility];  Service: General;  Laterality: N/A;   FINGER AMPUTATION Right 1980's   3rd & 4th digits "got them mashed" (01/17/2013)   KNEE ARTHROSCOPY Right 1980's   MULTIPLE EXTRACTIONS WITH ALVEOLOPLASTY Bilateral 11/21/2015   Procedure: Extraction of tooth #'s 2,12 with alveoloplasty and gross debridement of remaining dentition;  Surgeon: Charlynne Pander, DDS;  Location: [facility] OR;  Service: Oral Surgery;  Laterality: Bilateral;   TEE WITHOUT CARDIOVERSION N/A 11/20/2015   Procedure: TRANSESOPHAGEAL ECHOCARDIOGRAM (TEE);  Surgeon: Laurey Morale, MD;  Location: [facility] ENDOSCOPY;  Service: Cardiovascular;  Laterality: N/A;   TEE WITHOUT CARDIOVERSION N/A 11/25/2015   Procedure: TRANSESOPHAGEAL ECHOCARDIOGRAM (TEE);  Surgeon: Alleen Borne, MD;  Location: [facility] OR;  Service: Open Heart Surgery;  Laterality: N/A;     Social History:   reports that he has never smoked. He has never used smokeless tobacco. He reports that he does not currently use alcohol. He reports current drug use. Drug: Marijuana.   Family History:  His family history includes Diabetes in his father; Hypertension in his brother, brother, brother, father, mother, and sister. There is no history of Colon cancer, Esophageal cancer, Rectal cancer, Stomach cancer, or Colon polyps.   Allergies Allergies  Allergen Reactions   Other Anaphylaxis    mushrooms   Hydrocodone-Acetaminophen Itching     Home Medications  Prior to Admission medications    Medication Sig Start Date End Date Taking? Authorizing Provider  allopurinol (ZYLOPRIM) 300 MG tablet Take 0.5 tablets (150 mg total) by mouth daily. 12/13/22  Yes Marcine Matar, MD  amLODipine (NORVASC) 10 MG tablet Take 1 tablet (10 mg total) by mouth daily. 08/12/22  Yes Marcine Matar, MD  atorvastatin (LIPITOR) 80 MG tablet TAKE 1 TABLET BY MOUTH ONCE DAILY AT 6 IN THE EVENING 11/11/22  Yes Marcine Matar, MD  carvedilol (COREG) 6.25 MG tablet  Take 1 tablet (6.25 mg total) by mouth 2 (two) times daily. 01/11/23  Yes Gaston Islam., NP  clopidogrel (PLAVIX) 75 MG tablet Take 1 tablet by mouth once daily 12/28/22   Marcine Matar, MD  colchicine 0.6 MG tablet Take 1 tablet (0.6 mg total) by mouth 2 (two) times daily as needed. 12/16/22   Tarry Kos, MD  EQ ALLERGY RELIEF 10 MG tablet Take 1 tablet by mouth once daily 03/06/19   Marcine Matar, MD  ezetimibe (ZETIA) 10 MG tablet Take 1 tablet (10 mg total) by mouth daily. 11/11/22   Marcine Matar, MD  FARXIGA 10 MG TABS tablet Take 10 mg by mouth daily. 02/04/22   [provider]  fluticasone Aleda Grana) 50 MCG/ACT nasal spray Use 2 spray(s) in each nostril once daily 10/26/22   Marcine Matar, MD  furosemide (LASIX) 40 MG tablet 1 tablet p.o. q. Mondays, Wednesday and Fridays. 12/13/22   Marcine Matar, MD  lisinopril (ZESTRIL) 40 MG tablet Take 1 tablet (40 mg total) by mouth daily. 08/12/22   Marcine Matar, MD  potassium chloride SA (KLOR-CON M) 20 MEQ tablet TAKE 1 TABLET BY MOUTH ON MONDAY, Wed, FRIDAY and Saturdays. 12/13/22   Marcine Matar, MD  predniSONE (DELTASONE) 20 MG tablet Take 20 mg by mouth as needed.    [provider]  predniSONE (STERAPRED UNI-PAK 21 TAB) 10 MG (21) TBPK tablet Take as directed 12/16/22   Tarry Kos, MD     Critical care time: 50 minutes    JD Anselm Lis [facility] 01/29/2023, 2:17 AM  Please see [email or web address] for pager  details.  From 7A-7P if no response, please call [phone]. After hours, please call ELink [phone].

## 2023-01-29 NOTE — Progress Notes (Signed)
eLink Physician-Brief Progress Note Patient Name: [name] DOB: 1961-04-18 MRN: [id number]   Date of Service  01/29/2023  HPI/Events of Note  62 year old with a history of coronary artery disease status post CABG, on metabolic syndrome who initially presented to the emergency department confused and weak for 24 hours.  Patient had some syncope and myoclonic jerking and was brought to the emergency department for further evaluation.  In the emergency department he had an 8-minute PEA.  A code, was intubated and referred to ICU for further management  Patient is febrile, tachypneic, hypotensive.  Requiring 100% FiO2 to maintain low 90s saturation.  Currently on the ventilator and utilizing 4 mcg of nor epi.  Received broad-spectrum antibiotics.  Last gas with anemia, mixed metabolic and respiratory acidosis, and close BMP shows moderate hypokalemia, elevation in creatinine, transaminitis and lactic acidosis.  CBC with downtrending hemoglobins.  INR 2.3.  eICU Interventions  Scheduled to receive broad-spectrum antibiotics.  On high-dose steroids, multi pressor shock with norepinephrine and vasopressin.  Currently receiving bicarbonate therapy  No sedation, analgesia with fentanyl.  Added.  And Versed.  PPI prophylaxis, DVT prophylaxis held in setting of suspected bleed.  Team is in room actively placing central line, concern for TTP and will likely need plasmapheresis     Intervention Category Evaluation Type: New Patient Evaluation  Saryah Loper 01/29/2023, 4:27 AM

## 2023-01-29 NOTE — Progress Notes (Signed)
Rn notified that family taking pictures of IV medication bags and pumps.

## 2023-01-29 NOTE — Consult Note (Signed)
Reason for Consult: Acute kidney injury on chronic kidney disease stage IIIb, TTP Referring Physician: Levy Pupa, MD (CCM)  HPI:  62 year old man with past medical history significant for type 2 diabetes mellitus, hypertension, prior history of CVA, coronary artery disease status post CABG, history of AVR and baseline chronic kidney disease stage IIIb (creatinine 1.7-1.9).  Brought to the emergency room with 2 to 3 days of increasing fatigue progressing to confusion and altered mental status with a syncopal event yesterday.  On evaluation in the emergency room found to be confused with fevers and hypotension for which she was started on broad-spectrum antibiotics and intravenous fluids.  Additional labs also showed acute kidney injury with creatinine up to 4.2 with profound metabolic acidosis and anemia with thrombocytopenia.  His peripheral smear was validated by Dr. Bertis Ruddy and found to be consistent with hemolytic anemia with schistocytosis-clinical picture consistent with TTP.  In the emergency room the patient had worsening of his mental status and suffered a PEA arrest following which he required CPR/intubation and has been transferred to the ICU for pressor/ventilator support.  Per his friend who is at bedside, no preceding GI symptoms including hematochezia.   Past Medical History:  Diagnosis Date   Allergy    Anemia    Anxiety    Baker's cyst of knee    Blood transfusion without reported diagnosis    as baby    Coronary artery disease    quadruple bypass - March 2016   GERD (gastroesophageal reflux disease)    Gouty arthritis    "real bad" (01/17/2013)   Heart murmur    Hypercholesteremia    Hypertension    Myocardial infarction (HCC) 2017   Stroke (HCC)    Type II diabetes mellitus (HCC)     Past Surgical History:  Procedure Laterality Date   AORTIC VALVE REPLACEMENT N/A 11/25/2015   Procedure: AORTIC VALVE REPLACEMENT (AVR) USING A MAGNA EASE ;  Surgeon: Alleen Borne, MD;  Location: [facility] OR;  Service: Open Heart Surgery;  Laterality: N/A;   CARDIAC CATHETERIZATION N/A 11/19/2015   Procedure: Right/Left Heart Cath and Coronary Angiography;  Surgeon: Lyn Records, MD;  Location: [facility] INVASIVE CV LAB;  Service: Cardiovascular;  Laterality: N/A;   COLONOSCOPY  2004   CORONARY ARTERY BYPASS GRAFT N/A 11/25/2015   Procedure: CORONARY ARTERY BYPASS GRAFTING (CABG)x4 LIMA-LAD; SVG-OM; SVG-RCA; SVG-DIAG;  Surgeon: Alleen Borne, MD;  Location: [facility] OR;  Service: Open Heart Surgery;  Laterality: N/A;   CYST REMOVAL TRUNK N/A 08/24/2016   Procedure: EXCISION OF BACK ABSCESS;  Surgeon: Jimmye Norman, MD;  Location: [facility];  Service: General;  Laterality: N/A;   FINGER AMPUTATION Right 1980's   3rd & 4th digits "got them mashed" (01/17/2013)   KNEE ARTHROSCOPY Right 1980's   MULTIPLE EXTRACTIONS WITH ALVEOLOPLASTY Bilateral 11/21/2015   Procedure: Extraction of tooth #'s 2,12 with alveoloplasty and gross debridement of remaining dentition;  Surgeon: Charlynne Pander, DDS;  Location: [facility] OR;  Service: Oral Surgery;  Laterality: Bilateral;   TEE WITHOUT CARDIOVERSION N/A 11/20/2015   Procedure: TRANSESOPHAGEAL ECHOCARDIOGRAM (TEE);  Surgeon: Laurey Morale, MD;  Location: [facility] ENDOSCOPY;  Service: Cardiovascular;  Laterality: N/A;   TEE WITHOUT CARDIOVERSION N/A 11/25/2015   Procedure: TRANSESOPHAGEAL ECHOCARDIOGRAM (TEE);  Surgeon: Alleen Borne, MD;  Location: [facility] OR;  Service: Open Heart Surgery;  Laterality: N/A;    Family History  Problem Relation Age of Onset   Diabetes Father    Hypertension  Father    Hypertension Mother    Hypertension Sister    Hypertension Brother    Hypertension Brother    Hypertension Brother    Colon cancer Neg Hx    Esophageal cancer Neg Hx    Rectal cancer Neg Hx    Stomach cancer Neg Hx    Colon polyps Neg Hx     Social History:  reports that he has never smoked. He has never used smokeless tobacco. He reports  that he does not currently use alcohol. He reports current drug use. Drug: Marijuana.  Allergies:  Allergies  Allergen Reactions   Other Anaphylaxis    mushrooms   Hydrocodone-Acetaminophen Itching    Medications: I have reviewed the patient's current medications. Scheduled:  atorvastatin  80 mg Per Tube q1800   caplacizumab  11 mg Subcutaneous Daily   Chlorhexidine Gluconate Cloth  6 each Topical Q2000   docusate  100 mg Per Tube BID   ezetimibe  10 mg Per Tube Daily   insulin aspart  0-6 Units Subcutaneous Q4H   methylPREDNISolone (SOLU-MEDROL) injection  125 mg Intravenous Daily   midazolam       mouth rinse  15 mL Mouth Rinse Q2H   pantoprazole (PROTONIX) IV  40 mg Intravenous Daily   polyethylene glycol  17 g Per Tube Daily   sodium bicarbonate       vancomycin variable dose per unstable renal function (pharmacist dosing)   Does not apply See admin instructions   Continuous:  sodium chloride     sodium chloride     ceFEPime (MAXIPIME) IV     epinephrine 0.5 mcg/min (01/29/23 4098)   fentaNYL infusion INTRAVENOUS 25 mcg/hr (01/29/23 0324)   lactated ringers 150 mL/hr at 01/29/23 0012   magnesium sulfate bolus IVPB 2 g (01/29/23 0628)   norepinephrine (LEVOPHED) Adult infusion 58 mcg/min (01/29/23 0623)   prismasol BGK 4/2.5     sodium bicarbonate 150 mEq in sterile water 1,150 mL infusion 150 mL/hr at 01/29/23 0542   sodium bicarbonate 150 mEq in sterile water 1,150 mL infusion     sodium bicarbonate 150 mEq in sterile water 1,150 mL infusion     [START ON 01/30/2023] vancomycin     vasopressin 0.03 Units/min (01/29/23 0521)       Latest Ref Rng & Units 01/29/2023    7:01 AM 01/29/2023    6:12 AM 01/29/2023    3:44 AM  BMP  Sodium 135 - 145 mmol/L 137  137  139   Potassium 3.5 - 5.1 mmol/L 4.4  4.2  3.8       Latest Ref Rng & Units 01/29/2023    7:01 AM 01/29/2023    6:12 AM 01/29/2023    3:44 AM  CBC  Hemoglobin 13.0 - 17.0 g/dL 7.8  6.8  6.1   Hematocrit 39.0  - 52.0 % 23.0  20.0  18.0     DG CHEST PORT 1 VIEW  Result Date: 01/29/2023 CLINICAL DATA:  Central line placement. EXAM: PORTABLE CHEST 1 VIEW COMPARISON:  Earlier same day FINDINGS: 0508 hours. Endotracheal tube tip is 2 cm above the base of the carina. The NG tube passes into the stomach although the distal tip position is not included on the film. Left IJ central line tip overlies the right upper mediastinum and venous positioning cannot be confirmed on this image. No pneumothorax or pleural effusion. The cardio pericardial silhouette is enlarged. Patchy parahilar opacity is noted bilaterally. Diffuse interstitial opacity suggests edema.  Telemetry leads overlie the chest. IMPRESSION: 1. Left IJ central line tip overlies the right upper mediastinum in the region of the innominate vein confluence. Venous positioning cannot be confirmed on this image. 2. Endotracheal tube tip is 2 cm above the base of the carina. 3. Cardiomegaly with patchy parahilar opacity. Pneumonia not excluded. Diffuse interstitial opacity suggests edema. Electronically Signed   By: Kennith Center M.D.   On: 01/29/2023 05:28   DG Abd 1 View  Result Date: 01/29/2023 CLINICAL DATA:  Orogastric tube placement. EXAM: ABDOMEN - 1 VIEW COMPARISON:  None Available. FINDINGS: An enteric tube is seen with its distal tip overlying the expected region of the body of the stomach. Its distal side hole is approximate 3.3 cm distal to the expected region of the gastroesophageal junction. The bowel gas pattern is normal. No radio-opaque calculi or other significant radiographic abnormality are seen. IMPRESSION: Enteric tube positioning, as described above. Electronically Signed   By: Aram Candela M.D.   On: 01/29/2023 03:49   DG Chest Port 1 View  Result Date: 01/29/2023 CLINICAL DATA:  Status post intubation. EXAM: PORTABLE CHEST 1 VIEW COMPARISON:  Jan 29, 2023 (12:19 a.m.) FINDINGS: Since the prior study there is been interval endotracheal  tube placement with its distal tip approximately 3.1 cm from the carina. Interval enteric tube placement is also seen with its distal end extending below the level of the diaphragm. Multiple sternal wires are noted. The cardiac silhouette is mildly enlarged and unchanged in size. An artificial aortic valve is seen. Low lung volumes are seen with mild left suprahilar and left infrahilar atelectasis and/or infiltrate. This represents a new finding. No pleural effusion or pneumothorax is identified. The visualized skeletal structures are unremarkable. IMPRESSION: 1. Interval endotracheal tube placement and enteric tube placement, as described above. 2. Low lung volumes with mild left suprahilar and left infrahilar atelectasis and/or infiltrate. Electronically Signed   By: Aram Candela M.D.   On: 01/29/2023 03:48   DG Chest Port 1 View  Result Date: 01/29/2023 CLINICAL DATA:  Altered mental status and tachypnea. EXAM: PORTABLE CHEST 1 VIEW COMPARISON:  October 11, 2022 FINDINGS: Multiple sternal wires are seen. The cardiac silhouette is mildly enlarged, likely technical in origin. An artificial aortic valve is seen. Low lung volumes are noted. Both lungs are clear. Multilevel degenerative changes are seen throughout the thoracic spine. IMPRESSION: 1. Evidence of prior median sternotomy/CABG. 2. Low lung volumes without acute cardiopulmonary disease. Electronically Signed   By: Aram Candela M.D.   On: 01/29/2023 00:41    Review of Systems  Unable to perform ROS: Intubated   Blood pressure 124/69, pulse 100, temperature (!) 94.1 F (34.5 C), resp. rate (!) 22, height 5\' 6"  (1.676 m), weight 96.6 kg, SpO2 96 %. Physical Exam Vitals and nursing note reviewed.  Constitutional:      Appearance: He is obese. He is ill-appearing.     Comments: Intubated, encephalopathic  HENT:     Head: Normocephalic and atraumatic.     Right Ear: External ear normal.     Left Ear: External ear normal.     Nose:  Nose normal.     Mouth/Throat:     Comments: Intubated, on ventilator Eyes:     General: No scleral icterus.    Conjunctiva/sclera: Conjunctivae normal.  Neck:     Comments: Left IJ triple-lumen dialysis catheter in place Cardiovascular:     Rate and Rhythm: Regular rhythm. Tachycardia present.     Pulses: Normal  pulses.     Heart sounds: Normal heart sounds.  Pulmonary:     Effort: Pulmonary effort is normal.     Breath sounds: Normal breath sounds. No wheezing or rales.     Comments: On ventilator Abdominal:     General: Bowel sounds are normal. There is distension.     Palpations: Abdomen is soft.     Tenderness: There is no abdominal tenderness.     Comments: Moderate global distention-obese  Musculoskeletal:     Right lower leg: No edema.     Left lower leg: No edema.  Skin:    General: Skin is warm and dry.    Assessment/Plan: 1.  Acute kidney injury on chronic kidney disease stage IIIb: Likely associated with TTP.  Cannot entirely rule out sepsis associated ATN although focus of sepsis remains unclear at this time.  Will check renal ultrasound to rule out any obstruction and await urine studies to help corroborate clinical diagnosis.  Given profound metabolic acidosis and hemodynamic instability, we will begin CRRT with the hope of undertaking plasmapheresis within the next 6 to 8 hours. 2.  Thrombotic thrombocytopenic purpura: High index of clinical suspicion based on discussion with Dr. Bertis Ruddy earlier today and her review of the peripheral blood smear.  Will begin plasmapheresis with 1 plasma volume (5 L) exchange with fresh frozen plasma for 7 consecutive days.  Dialysis staff mobilizing to help accommodate this.  Plasmic score 4 and likely early in the course of TTP. 3.  Shock: Suspected septic versus postcardiac arrest cardiogenic shock worsened by his metabolic acidosis and associated vasoplegia.  On pressors and sodium bicarbonate supplementation with goal to begin  CRRT. 4.  Anion gap metabolic acidosis: Severe and secondary to acute kidney injury, undertaking CRRT. 5.  Acute respiratory failure: Related to profound metabolic acidosis and inability to maintain tachypnea with respiratory fatigue.  Status postintubation/initiation of ventilator support. 6.  Acute metabolic encephalopathy: Likely secondary to TTP but ongoing evaluation for other etiologies including sepsis.  Azul Brumett K. 01/29/2023, 7:01 AM

## 2023-01-29 NOTE — ED Provider Notes (Signed)
[place] EMERGENCY DEPARTMENT AT [facility] Provider Note   CSN: [id number] Arrival date & time: 01/28/23  2345     History  Chief Complaint  Patient presents with   Altered Mental Status    [name] is a 62 y.o. male.  62 year old male with a history of coronary artery disease status post CABG a while back, diabetes, hypertension who presents the ER  secondary to mild confusion and weakness.  Sounds like symptoms probably started yesterday which is not feeling well after washing his car.  Sounds like today he was little bit disoriented but had significant generalized weakness we could not even stand up from the chair to get around.  Patient had a syncopal episode with some myoclonic jerking so EMS was called.  Brought here for further evaluation.  Review of systems patient states he is felt little more cold than normal recently.  Has had a mild nonproductive cough, not had any dysuria, diarrhea, nausea, chest pain, shortness of breath.   Altered Mental Status      Home Medications Prior to Admission medications   Medication Sig Start Date End Date Taking? Authorizing Provider  allopurinol (ZYLOPRIM) 300 MG tablet Take 0.5 tablets (150 mg total) by mouth daily. 12/13/22  Yes Marcine Matar, MD  amLODipine (NORVASC) 10 MG tablet Take 1 tablet (10 mg total) by mouth daily. 08/12/22  Yes Marcine Matar, MD  atorvastatin (LIPITOR) 80 MG tablet TAKE 1 TABLET BY MOUTH ONCE DAILY AT 6 IN THE EVENING Patient taking differently: Take 80 mg by mouth See admin instructions. TAKE 1 TABLET BY MOUTH ONCE DAILY AT 6 IN THE EVENING 11/11/22  Yes Marcine Matar, MD  carvedilol (COREG) 6.25 MG tablet Take 1 tablet (6.25 mg total) by mouth 2 (two) times daily. 01/11/23  Yes Gaston Islam., NP  clopidogrel (PLAVIX) 75 MG tablet Take 1 tablet by mouth once daily 12/28/22  Yes Marcine Matar, MD  colchicine 0.6 MG tablet Take 1 tablet (0.6 mg total) by mouth 2 (two) times  daily as needed. 12/16/22  Yes Tarry Kos, MD  ezetimibe (ZETIA) 10 MG tablet Take 1 tablet (10 mg total) by mouth daily. 11/11/22  Yes Marcine Matar, MD  FARXIGA 10 MG TABS tablet Take 10 mg by mouth daily. 02/04/22  Yes [provider]  furosemide (LASIX) 40 MG tablet 1 tablet p.o. q. Mondays, Wednesday and Fridays. Patient taking differently: Take 40 mg by mouth every Monday, Wednesday, and Friday. 12/13/22  Yes Marcine Matar, MD  lisinopril (ZESTRIL) 40 MG tablet Take 1 tablet (40 mg total) by mouth daily. 08/12/22  Yes Marcine Matar, MD  potassium chloride SA (KLOR-CON M) 20 MEQ tablet TAKE 1 TABLET BY MOUTH ON MONDAY, Wed, FRIDAY and Saturdays. Patient taking differently: Take 20 mEq by mouth every Monday, Wednesday, and Friday. 12/13/22  Yes Marcine Matar, MD  predniSONE (DELTASONE) 20 MG tablet Take 20 mg by mouth as needed.   Yes [provider]  EQ ALLERGY RELIEF 10 MG tablet Take 1 tablet by mouth once daily 03/06/19   Marcine Matar, MD  fluticasone [facility]) 50 MCG/ACT nasal spray Use 2 spray(s) in each nostril once daily Patient taking differently: Place 2 sprays into both nostrils daily. 10/26/22   Marcine Matar, MD  predniSONE (STERAPRED UNI-PAK 21 TAB) 10 MG (21) TBPK tablet Take as directed Patient not taking: Reported on 01/29/2023 12/16/22   Tarry Kos, MD  Allergies    Other and Hydrocodone-acetaminophen    Review of Systems   Review of Systems  Physical Exam Updated Vital Signs BP (!) 74/51   Pulse 90   Temp (!) 100.6 F (38.1 C) (Rectal)   Resp (!) 22   Ht 5\' 6"  (1.676 m)   Wt 96.6 kg   SpO2 94%   BMI 34.38 kg/m  Physical Exam Vitals and nursing note reviewed.  Constitutional:      Appearance: He is well-developed.  HENT:     Head: Normocephalic and atraumatic.     Mouth/Throat:     Mouth: Mucous membranes are moist.  Eyes:     Pupils: Pupils are equal, round, and reactive to light.  Cardiovascular:      Rate and Rhythm: Tachycardia present.  Pulmonary:     Effort: Pulmonary effort is normal. Tachypnea present. No respiratory distress.  Abdominal:     General: There is no distension.  Musculoskeletal:        General: Normal range of motion.     Cervical back: Normal range of motion.  Skin:    General: Skin is warm and dry.  Neurological:     General: No focal deficit present.     Mental Status: He is alert.     ED Results / Procedures / Treatments   Labs (all labs ordered are listed, but only abnormal results are displayed) Labs Reviewed  LACTIC ACID, PLASMA - Abnormal; Notable for the following components:      Result Value   Lactic Acid, Venous 4.5 (*)    All other components within normal limits  LACTIC ACID, PLASMA - Abnormal; Notable for the following components:   Lactic Acid, Venous 5.7 (*)    All other components within normal limits  COMPREHENSIVE METABOLIC PANEL - Abnormal; Notable for the following components:   Sodium 134 (*)    Potassium 3.0 (*)    CO2 11 (*)    BUN 51 (*)    Creatinine, Ser 4.24 (*)    Albumin 2.9 (*)    AST 134 (*)    Total Bilirubin 1.9 (*)    GFR, Estimated 15 (*)    Anion gap 16 (*)    All other components within normal limits  CBC WITH DIFFERENTIAL/PLATELET - Abnormal; Notable for the following components:   RBC 1.98 (*)    Hemoglobin 6.8 (*)    HCT 22.9 (*)    MCV 115.7 (*)    MCH 34.3 (*)    MCHC 29.7 (*)    RDW 16.8 (*)    Platelets 121 (*)    nRBC 0.3 (*)    Neutro Abs 8.0 (*)    Lymphs Abs 0.4 (*)    Abs Immature Granulocytes 0.50 (*)    All other components within normal limits  PROTIME-INR - Abnormal; Notable for the following components:   Prothrombin Time 25.1 (*)    INR 2.3 (*)    All other components within normal limits  APTT - Abnormal; Notable for the following components:   aPTT 69 (*)    All other components within normal limits  BETA-HYDROXYBUTYRIC ACID - Abnormal; Notable for the following components:    Beta-Hydroxybutyric Acid 0.35 (*)    All other components within normal limits  DIC (DISSEMINATED INTRAVASCULAR COAGULATION)PANEL - Abnormal; Notable for the following components:   Prothrombin Time 25.4 (*)    INR 2.3 (*)    aPTT 71 (*)    D-Dimer, Quant >20.00 (*)  Platelets 100 (*)    All other components within normal limits  HEMOGLOBIN AND HEMATOCRIT, BLOOD - Abnormal; Notable for the following components:   Hemoglobin 6.8 (*)    HCT 22.2 (*)    All other components within normal limits  I-STAT VENOUS BLOOD GAS, ED - Abnormal; Notable for the following components:   pCO2, Ven 21.7 (*)    pO2, Ven 121 (*)    Bicarbonate 11.2 (*)    TCO2 12 (*)    Acid-base deficit 13.0 (*)    Potassium 3.4 (*)    HCT 24.0 (*)    Hemoglobin 8.2 (*)    All other components within normal limits  RESP PANEL BY RT-PCR (RSV, FLU A&B, COVID)  RVPGX2  CULTURE, BLOOD (ROUTINE X 2)  CULTURE, BLOOD (ROUTINE X 2)  URINALYSIS, W/ REFLEX TO CULTURE (INFECTION SUSPECTED)  BLOOD GAS, VENOUS  LACTATE DEHYDROGENASE  BLOOD GAS, ARTERIAL  HIV ANTIBODY (ROUTINE TESTING W REFLEX)  COMPREHENSIVE METABOLIC PANEL  LACTIC ACID, PLASMA  LACTIC ACID, PLASMA  PROCALCITONIN  CORTISOL  BLOOD GAS, ARTERIAL  CBC  MAGNESIUM  AMMONIA  CBG MONITORING, ED  TYPE AND SCREEN    EKG EKG Interpretation  Date/Time:  Friday Jan 28 2023 23:43:23 EDT Ventricular Rate:  111 PR Interval:  163 QRS Duration: 113 QT Interval:  358 QTC Calculation: 487 R Axis:   15 Text Interpretation: Sinus tachycardia Atrial premature complexes Incomplete right bundle branch block Low voltage, precordial leads Borderline prolonged QT interval Confirmed by Marily Memos [phone]) on 01/29/2023 2:10:18 AM  Radiology DG Chest Port 1 View  Result Date: 01/29/2023 CLINICAL DATA:  Altered mental status and tachypnea. EXAM: PORTABLE CHEST 1 VIEW COMPARISON:  October 11, 2022 FINDINGS: Multiple sternal wires are seen. The cardiac silhouette is  mildly enlarged, likely technical in origin. An artificial aortic valve is seen. Low lung volumes are noted. Both lungs are clear. Multilevel degenerative changes are seen throughout the thoracic spine. IMPRESSION: 1. Evidence of prior median sternotomy/CABG. 2. Low lung volumes without acute cardiopulmonary disease. Electronically Signed   By: Aram Candela M.D.   On: 01/29/2023 00:41    Procedures .Critical Care  Performed by: Marily Memos, MD Authorized by: Marily Memos, MD   Critical care provider statement:    Critical care time (minutes):  50   Critical care was necessary to treat or prevent imminent or life-threatening deterioration of the following conditions:  Renal failure, respiratory failure, cardiac failure and metabolic crisis   Critical care was time spent personally by me on the following activities:  Development of treatment plan with patient or surrogate, discussions with consultants, evaluation of patient's response to treatment, examination of patient, ordering and review of laboratory studies, ordering and review of radiographic studies, ordering and performing treatments and interventions, pulse oximetry, re-evaluation of patient's condition and review of old charts Procedure Name: Intubation Date/Time: 01/29/2023 3:29 AM  Performed by: Marily Memos, MDPre-anesthesia Checklist: Patient identified, Patient being monitored, Emergency Drugs available, Timeout performed and Suction available Oxygen Delivery Method: Non-rebreather mask Preoxygenation: Pre-oxygenation with 100% oxygen Induction Type: Rapid sequence Ventilation: Mask ventilation without difficulty Laryngoscope Size: Glidescope and 4 Grade View: Grade II Tube size: 8.0 mm Number of attempts: 1 Airway Equipment and Method: Rigid stylet and Video-laryngoscopy Placement Confirmation: ETT inserted through vocal cords under direct vision, CO2 detector and Breath sounds checked- equal and bilateral Secured at:  25 cm Tube secured with: ETT holder Difficulty Due To: Difficult Airway-  due to edematous airway Future  Recommendations: Recommend- induction with short-acting agent, and alternative techniques readily available    CPR  Date/Time: 01/29/2023 3:30 AM  Performed by: Marily Memos, MD Authorized by: Marily Memos, MD  CPR Procedure Details:    ACLS/BLS initiated by EMS: No     CPR/ACLS performed in the ED: Yes     Duration of CPR (minutes):  15   Outcome: ROSC obtained    CPR performed via ACLS guidelines under my direct supervision.  See RN documentation for details including defibrillator use, medications, doses and timing.     Medications Ordered in ED Medications  lactated ringers infusion ( Intravenous New Bag/Given 01/29/23 0012)  vancomycin (VANCOREADY) IVPB 2000 mg/400 mL (2,000 mg Intravenous New Bag/Given 01/29/23 0156)  docusate (COLACE) 50 MG/5ML liquid 100 mg (has no administration in time range)  polyethylene glycol (MIRALAX / GLYCOLAX) packet 17 g (has no administration in time range)  pantoprazole (PROTONIX) injection 40 mg (has no administration in time range)  fentaNYL in NS (48mcg/ml) infusion-PREMIX (25 mcg/hr Intravenous New Bag/Given 01/29/23 0324)  fentaNYL (SUBLIMAZE) bolus via infusion 50-100 mcg (has no administration in time range)  ipratropium-albuterol (DUONEB) 0.5-2.5 (3) MG/3ML nebulizer solution 3 mL (has no administration in time range)  sodium bicarbonate 1 mEq/mL injection (has no administration in time range)  ceFEPIme (MAXIPIME) 2 g in sodium chloride 0.9 % 100 mL IVPB (0 g Intravenous Stopped 01/29/23 0014)  metroNIDAZOLE (FLAGYL) IVPB 500 mg (0 mg Intravenous Stopped 01/29/23 0133)  lactated ringers bolus 1,000 mL (0 mLs Intravenous Stopped 01/29/23 0133)  lactated ringers bolus 1,000 mL (0 mLs Intravenous Stopped 01/29/23 0226)  LORazepam (ATIVAN) injection 1 mg (1 mg Intravenous Given 01/29/23 0153)  acetaminophen (TYLENOL) tablet 650  mg (650 mg Oral Given 01/29/23 0152)  LORazepam (ATIVAN) injection 1 mg (1 mg Intravenous Given 01/29/23 0230)  norepinephrine (LEVOPHED) 4-5 MG/250ML-% infusion SOLN (4 mg  New Bag/Given 01/29/23 0326)    ED Course/ Medical Decision Making/ A&P                             Medical Decision Making Amount and/or Complexity of Data Reviewed Labs: ordered. Radiology: ordered. ECG/medicine tests: ordered.  Risk OTC drugs. Prescription drug management. Decision regarding hospitalization.   Patient with a rectal temperature of 100.6, tachypnea, tachycardia so code sepsis initiated broad-spectrum antibiotics initiated.  On my interpretation it did appear in his chest x-ray may have a right lower lobe pneumonia however radiology read it as normal. Labs started to return and he had a hemoglobin of 6.8.  He denies any recent bleeding, dark stools or red stools.  Will add a type and screen and recheck it.  Also noted to have a mild thrombocytopenia.  Also has AKI and fevers consider TTP so a DIC panel ordered.  Patient persistently tachypneic above 40.  Significantly agitated as well.  Requesting something to relax.  Small dose of Ativan given.  Discussed with critical care for admission.  Patient breathing improved more relaxed vital signs stable blood pressure improving.  Patient evaluated by critical care.  Apparently shortly after that his oxygen rapidly desaturated and he became bradycardic and arrested.  CPR done for about 15 minutes with bicarb and epi.  Intubated by myself as above.  ROSC was obtained.  Labs are showing schistocytes and worsening thrombocytopenia. PCCM aware.   Final Clinical Impression(s) / ED Diagnoses Final diagnoses:  Sepsis with acute renal failure without septic shock,  due to unspecified organism, unspecified acute renal failure type (HCC)  AKI (acute kidney injury) (HCC)  Hypoxia    Rx / DC Orders ED Discharge Orders     None         Lemon Sternberg, Barbara Cower,  MD 01/29/23 [phone]

## 2023-01-29 NOTE — Progress Notes (Signed)
Pharmacy Antibiotic Note  [name] is a 63 y.o. male admitted on 01/28/2023 with sepsis and concern for TTP.  Pharmacy has been consulted for vancomycin and cefepime dosing. Pt w/ AKI on CKD. Plan to start CRRT but currently too unstable.  Vancomycin 2g IV x 1 and cefepime doses already given overnight.  Plan: Hold further vancomycin doses until RRT plan clear - once CRRT able to begin, will start vancomycin 1g IV q24h. Otherwise will check vancomycin random level 5/27 AM to assess for re-dose Adjust cefepime to 1g IV q24h for now >> once CRRT begins, will start cefepime 2g IV q12h Monitor clinical progress, c/s, abx plan/LOT Vancomycin levels as indicated F/u Nephrology plans   Height: 5\' 6"  (167.6 cm) Weight: 96.6 kg (213 lb) IBW/kg (Calculated) : 63.8  Temp (24hrs), Avg:95.3 F (35.2 C), Min:87.4 F (30.8 C), Max:100.6 F (38.1 C)  Recent Labs  Lab 01/28/23 2355 01/29/23 0210 01/29/23 0637  WBC 9.6  --  11.4*  CREATININE 4.24*  --  4.65*  LATICACIDVEN 4.5* 5.7* 6.6*     Estimated Creatinine Clearance: 17.7 mL/min (A) (by C-G formula based on SCr of 4.65 mg/dL (H)).    Allergies  Allergen Reactions   Other Anaphylaxis    mushrooms   Plavix [Clopidogrel Bisulfate] Other (See Comments)    TTP   Hydrocodone-Acetaminophen Itching    Leia Alf, PharmD, BCPS Please check AMION for all [facility] contact numbers Clinical Pharmacist 01/29/2023 9:02 AM

## 2023-01-29 NOTE — Progress Notes (Signed)
NAME:  [name], MRN:  [id number], DOB:  May 02, 1961, LOS: 0 ADMISSION DATE:  01/28/2023, CONSULTATION DATE:  5/25 REFERRING MD:  Dr. Clayborne Dana, CHIEF COMPLAINT:  AKI and AMS   History of Present Illness:  Patient is [name] w/ pertinent PMH CAD s/p cabg, HTN, HLD, DMT2, CKD3, CVA, CKD 3b presents to [facility] on 5/25 w/ AMS.  Per patient's wife over on 5/23 patient feeling more fatigued and had generalized weakness. On 5/24 patient becoming more confused and tachypnea. Patient then had a syncopal episdoe w/ some possible myoclonic jerking and EMS was called and transferred patient to [facility].  Upon arrival patient confused but able to state name. Tmax 100.6 F and soft BP. Patient tachypneic in 40s and diaphoretic. IV fluids given, cultures obtained, and started on broad spectrum abx. LA 4.5. CXR w/ possible Left infrahilar atelectasis vs. Infiltrate. Covid/flu negative. Hgb 6.8 and platelets 121. Patient and wife denies any bloody/dark bowel mvts. Transfused 1 unit. K 3.0, creat 4.24, bun 51, co2 11, ag 16. INR 2.3, PT 25.1, ptt 69. DIC panel w/ normal fibrinogen and schistocytes present. VBG 7.32, 21, 121, 11 initially. Per nurse patient was confused moving around in bed and trying to get out of bed. Patient required ativan for delirium. About an hour later patient mental status worsened and patient began to desat. Patient required to be bagged and continued to brady down and quickly lost pulse. PEA arrest and cpr started. Patient given epi and bicarb. Intuated. ROSC in 8 minutes. Post arrest hypotensive and on levo. PCCM consulted for icu admission.  Pertinent  Medical History   Past Medical History:  Diagnosis Date   Allergy    Anemia    Anxiety    Baker's cyst of knee    Blood transfusion without reported diagnosis    as baby    Coronary artery disease    quadruple bypass - March 2016   GERD (gastroesophageal reflux disease)    Gouty arthritis    "real bad" (01/17/2013)   Heart murmur     Hypercholesteremia    Hypertension    Myocardial infarction (HCC) 2017   Stroke (HCC)    Type II diabetes mellitus (HCC)      Significant Hospital Events: Including procedures, antibiotic start and stop dates in addition to other pertinent events   5/25 admitted to [facility] w/ likely TTP; patient in resp distress and ended up PEA arrest; rosc 8 minutes  Interim History / Subjective:  See above  Objective   Blood pressure (!) 89/69, pulse 87, temperature (!) 95.7 F (35.4 C), resp. rate 18, height 5\' 6"  (1.676 m), weight 96.6 kg, SpO2 100 %.    Vent Mode: PRVC FiO2 (%):  [100 %] 100 % Set Rate:  [24 bmp-28 bmp] 24 bmp Vt Set:  [510 mL] 510 mL PEEP:  [5 cmH20-8 cmH20] 8 cmH20 Plateau Pressure:  [20 cmH20-31 cmH20] 20 cmH20   Intake/Output Summary (Last 24 hours) at 01/29/2023 4098 Last data filed at 01/29/2023 0740 Gross per 24 hour  Intake 1715.24 ml  Output --  Net 1715.24 ml   Filed Weights   01/29/23 0000  Weight: 96.6 kg    Examination: General: Acute on chronic ill-appearing middle-age male lying in bed diaphoretic on ventilator no acute distress HEENT: ETT, MM pink/moist, PERRL,  Neuro: Unresponsive CV: s1s2 regular rate and rhythm, no murmur, rubs, or gallops,  PULM: Clear to auscultation bilaterally, no increased work of breathing, no added breath sounds,  tolerating ventilator GI: soft, bowel sounds active in all 4 quadrants, non-tender, non-distended Extremities: warm/dry, no edema  Skin: no rashes or lesions  Resolved Hospital Problem list     Assessment & Plan:   Respiratory arrest followed by PEA in hospital cardiac arrest -Patient with worsening altered mental status and desaturation. Patient being bagged and respiratory arrested. PEA. ROSC 8 minutes. Elevated troponin in the setting of cardiac arrest and multisystem organ dysfunction -High-sensitivity troponin 3644, EKG with no ST elevation or depression P: Remains critically ill in the ICU Continue  vasopressor support for MAP goal greater than 65 Vent support as below Echo pending as below Continuous telemetry  Septic shock -Unclear source of infection; possible left infrahilar infiltrate. P: Continue empiric cefepime and vancomycin Vasopressor support for MAP goal greater than 65, currently on epinephrine, norepinephrine, and vasopressin drips Discussed with pharmacy potential for adding methylene blue but given potential hemolytic anemia associated with toxicity will not and Stress dose steroids Trend blood cultures Monitor urine output Bicarb drip as below  Acute respiratory failure w/ hypercapnia Concern for CAP, POA -Respiratory arrest as above P: Continue ventilator support with lung protective strategies  Wean PEEP and FiO2 for sats greater than 90%. Head of bed elevated 30 degrees. Plateau pressures less than 30 cm H20.  Follow intermittent chest x-ray and ABG.   SAT/SBT as tolerated, mentation preclude extubation  Ensure adequate pulmonary hygiene  Follow cultures  VAP bundle in place  PAD protocol To biotics as above  TTP -dic panel showing schistocytes; patient w/ anemia, fever, thrombocytopenia, aki, neuro change. Patient on plavix at home. P: Oncology/hematology consulted, appreciate assistance HD catheter in place with plans of plasmapheresis ADAMST13 antibody pending Continue IV steroids S/p 1 unit PRBC Cabivi per Hematology, not supplied at this facility awaiting arrival from [facility]  Acute encephalopathy -Likely TTP related Possible syncopal episode w/ myoclonic jerking? History of prior CVA P: Management per neurology  Maintain neuro protective measures Nutrition and bowel regiment  Seizure precautions  Aspirations precautions  CT head ordered but currently too unstable to pain EEG pending Minimize sedation  AKI on CKD 3b AGMA Hypokalemia Mild hyponatremia -Likely dehydration P: Nephrology following, appreciate  assistance Plasmapheresis as above Follow renal function  Monitor urine output Trend Bmet Avoid nephrotoxins Ensure adequate renal perfusion  As electrolytes  Elevated AST likely secondary to shock liver P: Avoid hepatotoxins Hold home statin Trend LFTs  DMT2 P: Continue SSI CBG checks every 4 hours CBG goal 140-180  HTN HLD CAD s/p CABG H/o AVR P: Hold home antihypertensives Hold home statin Home Plavix remains on hold setting of likely TTP Continuous telemetry Strict intake and output Optimize electrolytes  Hx of Gout P: Home medication remains on hold  Best Practice (right click and "Reselect all SmartList Selections" daily)   Diet/type: NPO w/ meds via tube DVT prophylaxis: SCD GI prophylaxis: PPI Lines: N/A Foley:  N/A Code Status:  full code Last date of multidisciplinary goals of care discussion [5/25 updated wife at bedside]  Critical care time: 40 mins additional critical care time    Shyheem Whitham D. Harris, NP-C [facility] Personal contact information can be found on Amion  If no contact or response made please call [phone] 01/29/2023, 8:24 AM

## 2023-01-29 NOTE — Progress Notes (Signed)
RT NOTE:  Pt transported to 3M04 without event. Report given to It[place], RRT.

## 2023-01-29 NOTE — ED Notes (Signed)
Condom cath put on patient. 

## 2023-01-29 NOTE — Discharge Summary (Signed)
Physician Discharge Summary  Patient ID: [name] MRN: [id number] DOB/AGE: Jan 18, 1961 62 y.o.  Admit date: 01/28/2023 Discharge date: 01/29/2023  Admission Diagnoses: Presented with altered mental status, fatigue, generalized weakness  Discharge Diagnoses:  Principal Problem:   MSSA bacteremia Active Problems:   Sepsis (HCC)   TTP (thrombotic thrombocytopenic purpura) (HCC)   PEA (Pulseless electrical activity) (HCC)   Severe sepsis with septic shock (HCC)   Prosthetic valve endocarditis (HCC) Coronary artery disease Hypertension Hyperlipidemia Type 2 diabetes History of prior CVA  Discharged Condition: critical  Hospital Course:  Patient came in with altered mental status, confusion symptoms started about the day before presentation. Became more disoriented on the day of presentation and apparently had what appears to be syncopal episode at home before EMS was activated.  Has had a mild nonproductive cough and no other symptoms reported. Evaluated in the emergency department.  Apparently began to have episodes of bradycardia, loss respiratory drive and became pulseless.  CPR lasted about 8 minutes, IO placed He was admitted to the intensive start started on stress dose steroids care unit intubated, on 100% FiO2, on norepinephrine, received broad-spectrum antibiotics.  Started on stress dose steroids, norepinephrine, vasopressin, epinephrine, bicarb drip.  Central line was placed. With initial labs showing thrombocytopenia, presentation with altered mental status, acute kidney injury on chronic kidney disease consultation to heme-onc was placed.  Heme-onc felt Plavix may be responsible for the TTP.  AdamTS 13 test was sent and pending, recommendation for plasmapheresis.  Cablivi requested  Renal service also consulted started patient on CRRT with plans for plasmapheresis -Having difficulty having staff to perform plasmapheresis -[facility]  Patient seen by  infectious disease as blood cultures did reveal MSSA -Started on nafcillin  Consults: ID, nephrology, and hematology/oncology  Significant Diagnostic Studies: labs: Evidence of acute kidney injury, microbiology: blood culture: positive for MSSA , and radiology: X-Ray: Left lower lobe infiltrate  Treatments: antibiotics: nafcillin drip for MSSA, empirically received cefepime, metronidazole, vancomycin initially, analgesia: Currently on fentanyl at 200 mcg, steroids: solu-medrol-100 mg every 8, procedures: HD cath in place, and dialysis: On CRRT  Discharge Exam: Blood pressure 95/73, pulse 91, temperature (!) 97.2 F (36.2 C), resp. rate 18, height 5\' 6"  (1.676 m), weight 96.6 kg, SpO2 99 %. General appearance: moderately obese and chronically ill-appearing Head: Normocephalic, without obvious abnormality Eyes: conjunctivae/corneas clear. PERRL, EOM's intact. Fundi benign. Neck: no adenopathy, no carotid bruit, supple, symmetrical, trachea midline, and thyroid not enlarged, symmetric, no tenderness/mass/nodules Resp: diminished breath sounds bibasilarly and rhonchi bibasilar Cardio: S1, S2 normal Extremities: extremities normal, atraumatic, no cyanosis or edema Skin: Skin color, texture, turgor normal. No rashes or lesions or .  Disposition: Will be transferred to [facility] medications have not been resumed  He did have a stat head CT-results pending Most recent labs will be printed and sent with patient   Allergies as of 01/29/2023       Reactions   Other Anaphylaxis   mushrooms   Plavix [clopidogrel Bisulfate] Other (See Comments)   TTP   Hydrocodone-acetaminophen Itching        Medication List     TAKE these medications    allopurinol 300 MG tablet Commonly known as: ZYLOPRIM Take 0.5 tablets (150 mg total) by mouth daily.   amLODipine 10 MG tablet Commonly known as: NORVASC Take 1 tablet (10 mg total) by mouth daily.   atorvastatin 80 MG tablet Commonly  known as: LIPITOR TAKE 1 TABLET BY  MOUTH ONCE DAILY AT 6 IN THE EVENING What changed:  how much to take how to take this when to take this   carvedilol 6.25 MG tablet Commonly known as: COREG Take 1 tablet (6.25 mg total) by mouth 2 (two) times daily.   colchicine 0.6 MG tablet Take 1 tablet (0.6 mg total) by mouth 2 (two) times daily as needed.   EQ Allergy Relief 10 MG tablet Generic drug: loratadine Take 1 tablet by mouth once daily   ezetimibe 10 MG tablet Commonly known as: ZETIA Take 1 tablet (10 mg total) by mouth daily.   Farxiga 10 MG Tabs tablet Generic drug: dapagliflozin propanediol Take 10 mg by mouth daily.   fluticasone 50 MCG/ACT nasal spray Commonly known as: FLONASE Use 2 spray(s) in each nostril once daily What changed: See the new instructions.   furosemide 40 MG tablet Commonly known as: LASIX 1 tablet p.o. q. Mondays, Wednesday and Fridays. What changed:  how much to take how to take this when to take this additional instructions   lisinopril 40 MG tablet Commonly known as: ZESTRIL Take 1 tablet (40 mg total) by mouth daily.   potassium chloride SA 20 MEQ tablet Commonly known as: KLOR-CON M TAKE 1 TABLET BY MOUTH ON MONDAY, Wed, FRIDAY and Saturdays. What changed:  how much to take how to take this when to take this additional instructions   predniSONE 20 MG tablet Commonly known as: DELTASONE Take 20 mg by mouth as needed.   predniSONE 10 MG (21) Tbpk tablet Commonly known as: STERAPRED UNI-PAK 21 TAB Take as directed         Signed:

## 2023-01-29 NOTE — Progress Notes (Signed)
MB called and spoke to Nurse Roe Coombs, He requested to hold off until later this afternoon due to Pt and the current activity surrounding patient.

## 2023-01-29 NOTE — Progress Notes (Addendum)
PHARMACY - PHYSICIAN COMMUNICATION CRITICAL VALUE ALERT - BLOOD CULTURE IDENTIFICATION (BCID)  [name] is an 62 y.o. male who presented to [facility] on 01/28/2023  Assessment:  78 yom presenting with possible sepsis and likely TTP - Heme and Nephrology following and initiated CRRT and planning plasmapheresis when hemodynamic stability allows. Now with 4/4 blood culture bottles growing MSSA.  Name of physician (or Provider) Contacted: Dossie Arbour and Wynona Neat, A (CCM)  Current antibiotics: vancomycin/cefepime  Changes to prescribed antibiotics recommended:  De-escalate to cefazolin 2g IV q12h (dosed for CRRT), d/c vancomycin and cefepime. F/u ID consult recommendations Update - change to nafcillin 12g over 24hrs as continuous infusion per discussion with ID Zenaida [place]). Stop cefazolin  Results for orders placed or performed during the hospital encounter of 01/28/23  Blood Culture ID Panel (Reflexed) (Collected: 01/28/2023 11:55 PM)  Result Value Ref Range   Enterococcus faecalis NOT DETECTED NOT DETECTED   Enterococcus Faecium NOT DETECTED NOT DETECTED   Listeria monocytogenes NOT DETECTED NOT DETECTED   Staphylococcus species DETECTED (A) NOT DETECTED   Staphylococcus aureus (BCID) DETECTED (A) NOT DETECTED   Staphylococcus epidermidis NOT DETECTED NOT DETECTED   Staphylococcus lugdunensis NOT DETECTED NOT DETECTED   Streptococcus species NOT DETECTED NOT DETECTED   Streptococcus agalactiae NOT DETECTED NOT DETECTED   Streptococcus pneumoniae NOT DETECTED NOT DETECTED   Streptococcus pyogenes NOT DETECTED NOT DETECTED   A.calcoaceticus-baumannii NOT DETECTED NOT DETECTED   Bacteroides fragilis NOT DETECTED NOT DETECTED   Enterobacterales NOT DETECTED NOT DETECTED   Enterobacter cloacae complex NOT DETECTED NOT DETECTED   Escherichia coli NOT DETECTED NOT DETECTED   Klebsiella aerogenes NOT DETECTED NOT DETECTED   Klebsiella oxytoca NOT DETECTED NOT DETECTED   Klebsiella  pneumoniae NOT DETECTED NOT DETECTED   Proteus species NOT DETECTED NOT DETECTED   Salmonella species NOT DETECTED NOT DETECTED   Serratia marcescens NOT DETECTED NOT DETECTED   Haemophilus influenzae NOT DETECTED NOT DETECTED   Neisseria meningitidis NOT DETECTED NOT DETECTED   Pseudomonas aeruginosa NOT DETECTED NOT DETECTED   Stenotrophomonas maltophilia NOT DETECTED NOT DETECTED   Candida albicans NOT DETECTED NOT DETECTED   Candida auris NOT DETECTED NOT DETECTED   Candida glabrata NOT DETECTED NOT DETECTED   Candida krusei NOT DETECTED NOT DETECTED   Candida parapsilosis NOT DETECTED NOT DETECTED   Candida tropicalis NOT DETECTED NOT DETECTED   Cryptococcus neoformans/gattii NOT DETECTED NOT DETECTED   Meth resistant mecA/C and MREJ NOT DETECTED NOT DETECTED    Leia Alf, PharmD, BCPS Please check AMION for all [facility] contact numbers Clinical Pharmacist 01/29/2023 11:58 AM

## 2023-01-29 NOTE — Progress Notes (Addendum)
Pharmacy Antibiotic Note  [name] is a 62 y.o. male admitted on 01/28/2023 with sepsis and concern for TTP.  Pharmacy has been consulted for vancomycin and cefepime dosing.  Pt w/ AKI on CKD.  Plan: Vancomycin 2000mg  IV x1; monitor SCr +/- vanc levels prior to redosing. Cefepime 2g IV Q24H.  Height: 5\' 6"  (167.6 cm) Weight: 96.6 kg (213 lb) IBW/kg (Calculated) : 63.8  Temp (24hrs), Avg:99.5 F (37.5 C), Min:98.4 F (36.9 C), Max:100.6 F (38.1 C)  Recent Labs  Lab 01/28/23 2355 01/29/23 0210  WBC 9.6  --   CREATININE 4.24*  --   LATICACIDVEN 4.5* 5.7*    Estimated Creatinine Clearance: 19.4 mL/min (A) (by C-G formula based on SCr of 4.24 mg/dL (H)).    Allergies  Allergen Reactions   Other Anaphylaxis    mushrooms   Hydrocodone-Acetaminophen Itching    Thank you for allowing pharmacy to be a part of this patient's care.  Vernard Gambles, PharmD, BCPS  01/29/2023 4:45 AM   Addendum: CRRT now being started. Plan: Vancomycin 1000mg  IV Q24H and cefepime 2g IV Q12H. VB  5:44 AM

## 2023-01-30 DIAGNOSIS — R4182 Altered mental status, unspecified: Secondary | ICD-10-CM | POA: Diagnosis not present

## 2023-01-30 DIAGNOSIS — R160 Hepatomegaly, not elsewhere classified: Secondary | ICD-10-CM | POA: Diagnosis not present

## 2023-01-30 DIAGNOSIS — N179 Acute kidney failure, unspecified: Secondary | ICD-10-CM | POA: Diagnosis not present

## 2023-01-30 DIAGNOSIS — Z952 Presence of prosthetic heart valve: Secondary | ICD-10-CM | POA: Diagnosis not present

## 2023-01-30 DIAGNOSIS — Z9911 Dependence on respirator [ventilator] status: Secondary | ICD-10-CM | POA: Diagnosis not present

## 2023-01-30 DIAGNOSIS — G928 Other toxic encephalopathy: Secondary | ICD-10-CM | POA: Diagnosis not present

## 2023-01-30 DIAGNOSIS — I469 Cardiac arrest, cause unspecified: Secondary | ICD-10-CM | POA: Diagnosis not present

## 2023-01-30 DIAGNOSIS — R945 Abnormal results of liver function studies: Secondary | ICD-10-CM | POA: Diagnosis not present

## 2023-01-30 DIAGNOSIS — Z8674 Personal history of sudden cardiac arrest: Secondary | ICD-10-CM | POA: Diagnosis not present

## 2023-01-30 DIAGNOSIS — D696 Thrombocytopenia, unspecified: Secondary | ICD-10-CM | POA: Diagnosis not present

## 2023-01-30 DIAGNOSIS — R6521 Severe sepsis with septic shock: Secondary | ICD-10-CM | POA: Diagnosis not present

## 2023-01-30 DIAGNOSIS — M3119 Other thrombotic microangiopathy: Secondary | ICD-10-CM | POA: Diagnosis not present

## 2023-01-30 DIAGNOSIS — N189 Chronic kidney disease, unspecified: Secondary | ICD-10-CM | POA: Diagnosis not present

## 2023-01-30 DIAGNOSIS — E1122 Type 2 diabetes mellitus with diabetic chronic kidney disease: Secondary | ICD-10-CM | POA: Diagnosis not present

## 2023-01-30 DIAGNOSIS — D649 Anemia, unspecified: Secondary | ICD-10-CM | POA: Diagnosis not present

## 2023-01-30 DIAGNOSIS — A4101 Sepsis due to Methicillin susceptible Staphylococcus aureus: Secondary | ICD-10-CM | POA: Diagnosis not present

## 2023-01-30 DIAGNOSIS — K8020 Calculus of gallbladder without cholecystitis without obstruction: Secondary | ICD-10-CM | POA: Diagnosis not present

## 2023-01-30 DIAGNOSIS — I2510 Atherosclerotic heart disease of native coronary artery without angina pectoris: Secondary | ICD-10-CM | POA: Diagnosis not present

## 2023-01-30 DIAGNOSIS — K828 Other specified diseases of gallbladder: Secondary | ICD-10-CM | POA: Diagnosis not present

## 2023-01-30 LAB — TYPE AND SCREEN
ABO/RH(D): B POS
Antibody Screen: NEGATIVE

## 2023-01-30 LAB — URINE CULTURE

## 2023-01-30 LAB — BPAM RBC
Blood Product Expiration Date: 202406202359
ISSUE DATE / TIME: 202405250545
Unit Type and Rh: 7300

## 2023-01-30 LAB — BPAM FFP
Blood Product Expiration Date: 202405302359
Blood Product Expiration Date: 202405302359
ISSUE DATE / TIME: 202405250851
ISSUE DATE / TIME: 202405250851

## 2023-01-30 LAB — PREPARE FRESH FROZEN PLASMA: Unit division: 0

## 2023-01-30 LAB — CULTURE, BLOOD (ROUTINE X 2): Special Requests: ADEQUATE

## 2023-01-31 DIAGNOSIS — I469 Cardiac arrest, cause unspecified: Secondary | ICD-10-CM | POA: Diagnosis not present

## 2023-01-31 DIAGNOSIS — N189 Chronic kidney disease, unspecified: Secondary | ICD-10-CM | POA: Diagnosis not present

## 2023-01-31 DIAGNOSIS — G928 Other toxic encephalopathy: Secondary | ICD-10-CM | POA: Diagnosis not present

## 2023-01-31 DIAGNOSIS — N179 Acute kidney failure, unspecified: Secondary | ICD-10-CM | POA: Diagnosis not present

## 2023-01-31 DIAGNOSIS — A4101 Sepsis due to Methicillin susceptible Staphylococcus aureus: Secondary | ICD-10-CM | POA: Diagnosis not present

## 2023-01-31 DIAGNOSIS — E1122 Type 2 diabetes mellitus with diabetic chronic kidney disease: Secondary | ICD-10-CM | POA: Diagnosis not present

## 2023-01-31 DIAGNOSIS — D649 Anemia, unspecified: Secondary | ICD-10-CM | POA: Diagnosis not present

## 2023-01-31 DIAGNOSIS — Z9911 Dependence on respirator [ventilator] status: Secondary | ICD-10-CM | POA: Diagnosis not present

## 2023-01-31 DIAGNOSIS — M3119 Other thrombotic microangiopathy: Secondary | ICD-10-CM | POA: Diagnosis not present

## 2023-01-31 DIAGNOSIS — R6521 Severe sepsis with septic shock: Secondary | ICD-10-CM | POA: Diagnosis not present

## 2023-01-31 DIAGNOSIS — I2510 Atherosclerotic heart disease of native coronary artery without angina pectoris: Secondary | ICD-10-CM | POA: Diagnosis not present

## 2023-01-31 DIAGNOSIS — D696 Thrombocytopenia, unspecified: Secondary | ICD-10-CM | POA: Diagnosis not present

## 2023-01-31 DIAGNOSIS — R945 Abnormal results of liver function studies: Secondary | ICD-10-CM | POA: Diagnosis not present

## 2023-01-31 LAB — CULTURE, BLOOD (ROUTINE X 2)

## 2023-02-01 DIAGNOSIS — Z9911 Dependence on respirator [ventilator] status: Secondary | ICD-10-CM | POA: Diagnosis not present

## 2023-02-01 DIAGNOSIS — E1122 Type 2 diabetes mellitus with diabetic chronic kidney disease: Secondary | ICD-10-CM | POA: Diagnosis not present

## 2023-02-01 DIAGNOSIS — R945 Abnormal results of liver function studies: Secondary | ICD-10-CM | POA: Diagnosis not present

## 2023-02-01 DIAGNOSIS — I2510 Atherosclerotic heart disease of native coronary artery without angina pectoris: Secondary | ICD-10-CM | POA: Diagnosis not present

## 2023-02-01 DIAGNOSIS — A4101 Sepsis due to Methicillin susceptible Staphylococcus aureus: Secondary | ICD-10-CM | POA: Diagnosis not present

## 2023-02-01 DIAGNOSIS — D696 Thrombocytopenia, unspecified: Secondary | ICD-10-CM | POA: Diagnosis not present

## 2023-02-01 DIAGNOSIS — D649 Anemia, unspecified: Secondary | ICD-10-CM | POA: Diagnosis not present

## 2023-02-01 DIAGNOSIS — M3119 Other thrombotic microangiopathy: Secondary | ICD-10-CM | POA: Diagnosis not present

## 2023-02-01 DIAGNOSIS — Z4659 Encounter for fitting and adjustment of other gastrointestinal appliance and device: Secondary | ICD-10-CM | POA: Diagnosis not present

## 2023-02-01 DIAGNOSIS — A419 Sepsis, unspecified organism: Secondary | ICD-10-CM | POA: Diagnosis not present

## 2023-02-01 DIAGNOSIS — N189 Chronic kidney disease, unspecified: Secondary | ICD-10-CM | POA: Diagnosis not present

## 2023-02-01 DIAGNOSIS — J1520 Pneumonia due to staphylococcus, unspecified: Secondary | ICD-10-CM | POA: Diagnosis not present

## 2023-02-01 DIAGNOSIS — R6521 Severe sepsis with septic shock: Secondary | ICD-10-CM | POA: Diagnosis not present

## 2023-02-01 DIAGNOSIS — G928 Other toxic encephalopathy: Secondary | ICD-10-CM | POA: Diagnosis not present

## 2023-02-01 DIAGNOSIS — N179 Acute kidney failure, unspecified: Secondary | ICD-10-CM | POA: Diagnosis not present

## 2023-02-01 DIAGNOSIS — I469 Cardiac arrest, cause unspecified: Secondary | ICD-10-CM | POA: Diagnosis not present

## 2023-02-01 LAB — ADAMTS13 ACTIVITY: Adamts 13 Activity: 34.7 % — ABNORMAL LOW (ref >66.8)

## 2023-02-01 LAB — CULTURE, BLOOD (ROUTINE X 2)

## 2023-02-01 LAB — ADAMTS13 ACTIVITY REFLEX

## 2023-02-01 NOTE — Telephone Encounter (Signed)
Requested Prescriptions  Pending Prescriptions Disp Refills   amLODipine (NORVASC) 10 MG tablet [Pharmacy Med Name: amLODIPine Besylate 10 MG Oral Tablet] 90 tablet 0    Sig: Take 1 tablet by mouth once daily     Cardiovascular: Calcium Channel Blockers 2 Passed - 01/29/2023 12:26 PM      Passed - Last BP in normal range    BP Readings from Last 1 Encounters:  01/29/23 110/84         Passed - Last Heart Rate in normal range    Pulse Readings from Last 1 Encounters:  01/29/23 (!) 107         Passed - Valid encounter within last 6 months    Recent Outpatient Visits           1 month ago Type 2 diabetes mellitus with obesity (HCC)   [place] [facility] Marcine Matar, MD   2 months ago Dyslipidemia   [facility] [place], Howe L, RPH-CPP   3 months ago Edema of both legs   [place] [facility] Marcine Matar, MD   4 months ago Subacute cough   [place] [facility] [place], Odette Horns, MD   5 months ago Diabetes mellitus with macroalbuminuric diabetic nephropathy [facility])   [place] [facility] Marcine Matar, MD       Future Appointments             In 2 months Laural Benes, Binnie Rail, MD [facility]

## 2023-02-02 DIAGNOSIS — I517 Cardiomegaly: Secondary | ICD-10-CM | POA: Diagnosis not present

## 2023-02-02 DIAGNOSIS — G9340 Encephalopathy, unspecified: Secondary | ICD-10-CM | POA: Diagnosis not present

## 2023-02-02 DIAGNOSIS — N1832 Chronic kidney disease, stage 3b: Secondary | ICD-10-CM | POA: Diagnosis not present

## 2023-02-02 DIAGNOSIS — B9561 Methicillin susceptible Staphylococcus aureus infection as the cause of diseases classified elsewhere: Secondary | ICD-10-CM | POA: Diagnosis not present

## 2023-02-02 DIAGNOSIS — J15211 Pneumonia due to Methicillin susceptible Staphylococcus aureus: Secondary | ICD-10-CM | POA: Diagnosis not present

## 2023-02-02 DIAGNOSIS — Z952 Presence of prosthetic heart valve: Secondary | ICD-10-CM | POA: Diagnosis not present

## 2023-02-02 DIAGNOSIS — Z978 Presence of other specified devices: Secondary | ICD-10-CM | POA: Diagnosis not present

## 2023-02-02 DIAGNOSIS — I330 Acute and subacute infective endocarditis: Secondary | ICD-10-CM | POA: Diagnosis not present

## 2023-02-02 DIAGNOSIS — J810 Acute pulmonary edema: Secondary | ICD-10-CM | POA: Diagnosis not present

## 2023-02-02 DIAGNOSIS — M3119 Other thrombotic microangiopathy: Secondary | ICD-10-CM | POA: Diagnosis not present

## 2023-02-02 DIAGNOSIS — Z9911 Dependence on respirator [ventilator] status: Secondary | ICD-10-CM | POA: Diagnosis not present

## 2023-02-02 DIAGNOSIS — E1122 Type 2 diabetes mellitus with diabetic chronic kidney disease: Secondary | ICD-10-CM | POA: Diagnosis not present

## 2023-02-02 DIAGNOSIS — I469 Cardiac arrest, cause unspecified: Secondary | ICD-10-CM | POA: Diagnosis not present

## 2023-02-02 DIAGNOSIS — D696 Thrombocytopenia, unspecified: Secondary | ICD-10-CM | POA: Diagnosis not present

## 2023-02-02 DIAGNOSIS — N179 Acute kidney failure, unspecified: Secondary | ICD-10-CM | POA: Diagnosis not present

## 2023-02-02 DIAGNOSIS — D649 Anemia, unspecified: Secondary | ICD-10-CM | POA: Diagnosis not present

## 2023-02-02 LAB — CULTURE, BLOOD (ROUTINE X 2)
Culture: NO GROWTH
Special Requests: ADEQUATE

## 2023-02-03 DIAGNOSIS — R4182 Altered mental status, unspecified: Secondary | ICD-10-CM | POA: Diagnosis not present

## 2023-02-03 DIAGNOSIS — G928 Other toxic encephalopathy: Secondary | ICD-10-CM | POA: Diagnosis not present

## 2023-02-03 DIAGNOSIS — I499 Cardiac arrhythmia, unspecified: Secondary | ICD-10-CM | POA: Diagnosis not present

## 2023-02-03 DIAGNOSIS — E1122 Type 2 diabetes mellitus with diabetic chronic kidney disease: Secondary | ICD-10-CM | POA: Diagnosis not present

## 2023-02-03 DIAGNOSIS — R6521 Severe sepsis with septic shock: Secondary | ICD-10-CM | POA: Diagnosis not present

## 2023-02-03 DIAGNOSIS — R0682 Tachypnea, not elsewhere classified: Secondary | ICD-10-CM | POA: Diagnosis not present

## 2023-02-03 DIAGNOSIS — I330 Acute and subacute infective endocarditis: Secondary | ICD-10-CM | POA: Diagnosis not present

## 2023-02-03 DIAGNOSIS — R7881 Bacteremia: Secondary | ICD-10-CM | POA: Diagnosis not present

## 2023-02-03 DIAGNOSIS — I4510 Unspecified right bundle-branch block: Secondary | ICD-10-CM | POA: Diagnosis not present

## 2023-02-03 DIAGNOSIS — B9561 Methicillin susceptible Staphylococcus aureus infection as the cause of diseases classified elsewhere: Secondary | ICD-10-CM | POA: Diagnosis not present

## 2023-02-03 DIAGNOSIS — I63541 Cerebral infarction due to unspecified occlusion or stenosis of right cerebellar artery: Secondary | ICD-10-CM | POA: Diagnosis not present

## 2023-02-03 DIAGNOSIS — D696 Thrombocytopenia, unspecified: Secondary | ICD-10-CM | POA: Diagnosis not present

## 2023-02-03 DIAGNOSIS — N179 Acute kidney failure, unspecified: Secondary | ICD-10-CM | POA: Diagnosis not present

## 2023-02-03 DIAGNOSIS — N1832 Chronic kidney disease, stage 3b: Secondary | ICD-10-CM | POA: Diagnosis not present

## 2023-02-03 DIAGNOSIS — I469 Cardiac arrest, cause unspecified: Secondary | ICD-10-CM | POA: Diagnosis not present

## 2023-02-03 DIAGNOSIS — I079 Rheumatic tricuspid valve disease, unspecified: Secondary | ICD-10-CM | POA: Diagnosis not present

## 2023-02-03 DIAGNOSIS — M3119 Other thrombotic microangiopathy: Secondary | ICD-10-CM | POA: Diagnosis not present

## 2023-02-03 DIAGNOSIS — R140 Abdominal distension (gaseous): Secondary | ICD-10-CM | POA: Diagnosis not present

## 2023-02-03 DIAGNOSIS — E785 Hyperlipidemia, unspecified: Secondary | ICD-10-CM | POA: Diagnosis not present

## 2023-02-03 DIAGNOSIS — I129 Hypertensive chronic kidney disease with stage 1 through stage 4 chronic kidney disease, or unspecified chronic kidney disease: Secondary | ICD-10-CM | POA: Diagnosis not present

## 2023-02-03 DIAGNOSIS — Z8673 Personal history of transient ischemic attack (TIA), and cerebral infarction without residual deficits: Secondary | ICD-10-CM | POA: Diagnosis not present

## 2023-02-03 DIAGNOSIS — D649 Anemia, unspecified: Secondary | ICD-10-CM | POA: Diagnosis not present

## 2023-02-03 DIAGNOSIS — N189 Chronic kidney disease, unspecified: Secondary | ICD-10-CM | POA: Diagnosis not present

## 2023-02-03 DIAGNOSIS — I4891 Unspecified atrial fibrillation: Secondary | ICD-10-CM | POA: Diagnosis not present

## 2023-02-03 DIAGNOSIS — Z952 Presence of prosthetic heart valve: Secondary | ICD-10-CM | POA: Diagnosis not present

## 2023-02-03 LAB — CULTURE, BLOOD (ROUTINE X 2): Culture: NO GROWTH

## 2023-02-04 DIAGNOSIS — G928 Other toxic encephalopathy: Secondary | ICD-10-CM | POA: Diagnosis not present

## 2023-02-04 DIAGNOSIS — N179 Acute kidney failure, unspecified: Secondary | ICD-10-CM | POA: Diagnosis not present

## 2023-02-04 DIAGNOSIS — Z952 Presence of prosthetic heart valve: Secondary | ICD-10-CM | POA: Diagnosis not present

## 2023-02-04 DIAGNOSIS — D696 Thrombocytopenia, unspecified: Secondary | ICD-10-CM | POA: Diagnosis not present

## 2023-02-04 DIAGNOSIS — D649 Anemia, unspecified: Secondary | ICD-10-CM | POA: Diagnosis not present

## 2023-02-04 DIAGNOSIS — I330 Acute and subacute infective endocarditis: Secondary | ICD-10-CM | POA: Diagnosis not present

## 2023-02-04 DIAGNOSIS — I469 Cardiac arrest, cause unspecified: Secondary | ICD-10-CM | POA: Diagnosis not present

## 2023-02-04 DIAGNOSIS — I63541 Cerebral infarction due to unspecified occlusion or stenosis of right cerebellar artery: Secondary | ICD-10-CM | POA: Diagnosis not present

## 2023-02-04 DIAGNOSIS — B9561 Methicillin susceptible Staphylococcus aureus infection as the cause of diseases classified elsewhere: Secondary | ICD-10-CM | POA: Diagnosis not present

## 2023-02-04 DIAGNOSIS — E785 Hyperlipidemia, unspecified: Secondary | ICD-10-CM | POA: Diagnosis not present

## 2023-02-04 DIAGNOSIS — N189 Chronic kidney disease, unspecified: Secondary | ICD-10-CM | POA: Diagnosis not present

## 2023-02-04 DIAGNOSIS — I079 Rheumatic tricuspid valve disease, unspecified: Secondary | ICD-10-CM | POA: Diagnosis not present

## 2023-02-04 DIAGNOSIS — I639 Cerebral infarction, unspecified: Secondary | ICD-10-CM | POA: Diagnosis not present

## 2023-02-04 DIAGNOSIS — E1122 Type 2 diabetes mellitus with diabetic chronic kidney disease: Secondary | ICD-10-CM | POA: Diagnosis not present

## 2023-02-05 DIAGNOSIS — K8020 Calculus of gallbladder without cholecystitis without obstruction: Secondary | ICD-10-CM | POA: Diagnosis not present

## 2023-02-05 DIAGNOSIS — R140 Abdominal distension (gaseous): Secondary | ICD-10-CM | POA: Diagnosis not present

## 2023-02-05 DIAGNOSIS — M3119 Other thrombotic microangiopathy: Secondary | ICD-10-CM | POA: Diagnosis not present

## 2023-02-05 DIAGNOSIS — N179 Acute kidney failure, unspecified: Secondary | ICD-10-CM | POA: Diagnosis not present

## 2023-02-05 DIAGNOSIS — I639 Cerebral infarction, unspecified: Secondary | ICD-10-CM | POA: Diagnosis not present

## 2023-02-06 DIAGNOSIS — N179 Acute kidney failure, unspecified: Secondary | ICD-10-CM | POA: Diagnosis not present

## 2023-02-06 DIAGNOSIS — K8050 Calculus of bile duct without cholangitis or cholecystitis without obstruction: Secondary | ICD-10-CM | POA: Diagnosis not present

## 2023-02-06 DIAGNOSIS — R935 Abnormal findings on diagnostic imaging of other abdominal regions, including retroperitoneum: Secondary | ICD-10-CM | POA: Diagnosis not present

## 2023-02-06 DIAGNOSIS — Z4659 Encounter for fitting and adjustment of other gastrointestinal appliance and device: Secondary | ICD-10-CM | POA: Diagnosis not present

## 2023-02-06 DIAGNOSIS — M3119 Other thrombotic microangiopathy: Secondary | ICD-10-CM | POA: Diagnosis not present

## 2023-02-06 DIAGNOSIS — R748 Abnormal levels of other serum enzymes: Secondary | ICD-10-CM | POA: Diagnosis not present

## 2023-02-07 DIAGNOSIS — N179 Acute kidney failure, unspecified: Secondary | ICD-10-CM | POA: Diagnosis not present

## 2023-02-07 DIAGNOSIS — B9561 Methicillin susceptible Staphylococcus aureus infection as the cause of diseases classified elsewhere: Secondary | ICD-10-CM | POA: Diagnosis not present

## 2023-02-07 DIAGNOSIS — Z8673 Personal history of transient ischemic attack (TIA), and cerebral infarction without residual deficits: Secondary | ICD-10-CM | POA: Diagnosis not present

## 2023-02-07 DIAGNOSIS — D696 Thrombocytopenia, unspecified: Secondary | ICD-10-CM | POA: Diagnosis not present

## 2023-02-07 DIAGNOSIS — N19 Unspecified kidney failure: Secondary | ICD-10-CM | POA: Diagnosis not present

## 2023-02-07 DIAGNOSIS — R7881 Bacteremia: Secondary | ICD-10-CM | POA: Diagnosis not present

## 2023-02-07 DIAGNOSIS — I129 Hypertensive chronic kidney disease with stage 1 through stage 4 chronic kidney disease, or unspecified chronic kidney disease: Secondary | ICD-10-CM | POA: Diagnosis not present

## 2023-02-07 DIAGNOSIS — M3119 Other thrombotic microangiopathy: Secondary | ICD-10-CM | POA: Diagnosis not present

## 2023-02-07 DIAGNOSIS — Z952 Presence of prosthetic heart valve: Secondary | ICD-10-CM | POA: Diagnosis not present

## 2023-02-07 DIAGNOSIS — I079 Rheumatic tricuspid valve disease, unspecified: Secondary | ICD-10-CM | POA: Diagnosis not present

## 2023-02-07 DIAGNOSIS — N1832 Chronic kidney disease, stage 3b: Secondary | ICD-10-CM | POA: Diagnosis not present

## 2023-02-07 DIAGNOSIS — R6521 Severe sepsis with septic shock: Secondary | ICD-10-CM | POA: Diagnosis not present

## 2023-02-07 DIAGNOSIS — Z452 Encounter for adjustment and management of vascular access device: Secondary | ICD-10-CM | POA: Diagnosis not present

## 2023-02-07 DIAGNOSIS — E1122 Type 2 diabetes mellitus with diabetic chronic kidney disease: Secondary | ICD-10-CM | POA: Diagnosis not present

## 2023-02-08 DIAGNOSIS — Z953 Presence of xenogenic heart valve: Secondary | ICD-10-CM | POA: Diagnosis not present

## 2023-02-08 DIAGNOSIS — R4182 Altered mental status, unspecified: Secondary | ICD-10-CM | POA: Diagnosis not present

## 2023-02-08 DIAGNOSIS — J15211 Pneumonia due to Methicillin susceptible Staphylococcus aureus: Secondary | ICD-10-CM | POA: Diagnosis not present

## 2023-02-08 DIAGNOSIS — K8000 Calculus of gallbladder with acute cholecystitis without obstruction: Secondary | ICD-10-CM | POA: Diagnosis not present

## 2023-02-08 DIAGNOSIS — G9389 Other specified disorders of brain: Secondary | ICD-10-CM | POA: Diagnosis not present

## 2023-02-08 DIAGNOSIS — K859 Acute pancreatitis without necrosis or infection, unspecified: Secondary | ICD-10-CM | POA: Diagnosis not present

## 2023-02-08 DIAGNOSIS — I079 Rheumatic tricuspid valve disease, unspecified: Secondary | ICD-10-CM | POA: Diagnosis not present

## 2023-02-08 DIAGNOSIS — R34 Anuria and oliguria: Secondary | ICD-10-CM | POA: Diagnosis not present

## 2023-02-08 DIAGNOSIS — R7881 Bacteremia: Secondary | ICD-10-CM | POA: Diagnosis not present

## 2023-02-08 DIAGNOSIS — J9600 Acute respiratory failure, unspecified whether with hypoxia or hypercapnia: Secondary | ICD-10-CM | POA: Diagnosis not present

## 2023-02-08 DIAGNOSIS — R1310 Dysphagia, unspecified: Secondary | ICD-10-CM | POA: Diagnosis not present

## 2023-02-08 DIAGNOSIS — M3119 Other thrombotic microangiopathy: Secondary | ICD-10-CM | POA: Diagnosis not present

## 2023-02-08 DIAGNOSIS — G928 Other toxic encephalopathy: Secondary | ICD-10-CM | POA: Diagnosis not present

## 2023-02-08 DIAGNOSIS — D696 Thrombocytopenia, unspecified: Secondary | ICD-10-CM | POA: Diagnosis not present

## 2023-02-08 DIAGNOSIS — Z952 Presence of prosthetic heart valve: Secondary | ICD-10-CM | POA: Diagnosis not present

## 2023-02-08 DIAGNOSIS — B9561 Methicillin susceptible Staphylococcus aureus infection as the cause of diseases classified elsewhere: Secondary | ICD-10-CM | POA: Diagnosis not present

## 2023-02-08 DIAGNOSIS — K8070 Calculus of gallbladder and bile duct without cholecystitis without obstruction: Secondary | ICD-10-CM | POA: Diagnosis not present

## 2023-02-08 DIAGNOSIS — N179 Acute kidney failure, unspecified: Secondary | ICD-10-CM | POA: Diagnosis not present

## 2023-02-08 DIAGNOSIS — K8050 Calculus of bile duct without cholangitis or cholecystitis without obstruction: Secondary | ICD-10-CM | POA: Diagnosis not present

## 2023-02-08 DIAGNOSIS — G9341 Metabolic encephalopathy: Secondary | ICD-10-CM | POA: Diagnosis not present

## 2023-02-08 DIAGNOSIS — I330 Acute and subacute infective endocarditis: Secondary | ICD-10-CM | POA: Diagnosis not present

## 2023-02-08 DIAGNOSIS — N1832 Chronic kidney disease, stage 3b: Secondary | ICD-10-CM | POA: Diagnosis not present

## 2023-02-08 DIAGNOSIS — N170 Acute kidney failure with tubular necrosis: Secondary | ICD-10-CM | POA: Diagnosis not present

## 2023-02-08 DIAGNOSIS — K567 Ileus, unspecified: Secondary | ICD-10-CM | POA: Diagnosis not present

## 2023-02-08 DIAGNOSIS — E8770 Fluid overload, unspecified: Secondary | ICD-10-CM | POA: Diagnosis not present

## 2023-02-08 DIAGNOSIS — K838 Other specified diseases of biliary tract: Secondary | ICD-10-CM | POA: Diagnosis not present

## 2023-02-08 DIAGNOSIS — Z8673 Personal history of transient ischemic attack (TIA), and cerebral infarction without residual deficits: Secondary | ICD-10-CM | POA: Diagnosis not present

## 2023-02-08 DIAGNOSIS — Z4682 Encounter for fitting and adjustment of non-vascular catheter: Secondary | ICD-10-CM | POA: Diagnosis not present

## 2023-02-09 DIAGNOSIS — N179 Acute kidney failure, unspecified: Secondary | ICD-10-CM | POA: Diagnosis not present

## 2023-02-09 DIAGNOSIS — R7881 Bacteremia: Secondary | ICD-10-CM | POA: Diagnosis not present

## 2023-02-09 DIAGNOSIS — Q2112 Patent foramen ovale: Secondary | ICD-10-CM | POA: Diagnosis not present

## 2023-02-09 DIAGNOSIS — K8309 Other cholangitis: Secondary | ICD-10-CM | POA: Diagnosis not present

## 2023-02-09 DIAGNOSIS — Z9889 Other specified postprocedural states: Secondary | ICD-10-CM | POA: Diagnosis not present

## 2023-02-09 DIAGNOSIS — I669 Occlusion and stenosis of unspecified cerebral artery: Secondary | ICD-10-CM | POA: Diagnosis not present

## 2023-02-09 DIAGNOSIS — E8770 Fluid overload, unspecified: Secondary | ICD-10-CM | POA: Diagnosis not present

## 2023-02-09 DIAGNOSIS — I079 Rheumatic tricuspid valve disease, unspecified: Secondary | ICD-10-CM | POA: Diagnosis not present

## 2023-02-09 DIAGNOSIS — Z953 Presence of xenogenic heart valve: Secondary | ICD-10-CM | POA: Diagnosis not present

## 2023-02-09 DIAGNOSIS — J15211 Pneumonia due to Methicillin susceptible Staphylococcus aureus: Secondary | ICD-10-CM | POA: Diagnosis not present

## 2023-02-09 DIAGNOSIS — B9561 Methicillin susceptible Staphylococcus aureus infection as the cause of diseases classified elsewhere: Secondary | ICD-10-CM | POA: Diagnosis not present

## 2023-02-09 DIAGNOSIS — K859 Acute pancreatitis without necrosis or infection, unspecified: Secondary | ICD-10-CM | POA: Diagnosis not present

## 2023-02-09 DIAGNOSIS — K8020 Calculus of gallbladder without cholecystitis without obstruction: Secondary | ICD-10-CM | POA: Diagnosis not present

## 2023-02-09 DIAGNOSIS — G928 Other toxic encephalopathy: Secondary | ICD-10-CM | POA: Diagnosis not present

## 2023-02-09 DIAGNOSIS — D649 Anemia, unspecified: Secondary | ICD-10-CM | POA: Diagnosis not present

## 2023-02-09 DIAGNOSIS — R1310 Dysphagia, unspecified: Secondary | ICD-10-CM | POA: Diagnosis not present

## 2023-02-09 DIAGNOSIS — K851 Biliary acute pancreatitis without necrosis or infection: Secondary | ICD-10-CM | POA: Diagnosis not present

## 2023-02-09 DIAGNOSIS — R930 Abnormal findings on diagnostic imaging of skull and head, not elsewhere classified: Secondary | ICD-10-CM | POA: Diagnosis not present

## 2023-02-09 DIAGNOSIS — I330 Acute and subacute infective endocarditis: Secondary | ICD-10-CM | POA: Diagnosis not present

## 2023-02-09 DIAGNOSIS — N19 Unspecified kidney failure: Secondary | ICD-10-CM | POA: Diagnosis not present

## 2023-02-09 DIAGNOSIS — I6350 Cerebral infarction due to unspecified occlusion or stenosis of unspecified cerebral artery: Secondary | ICD-10-CM | POA: Diagnosis not present

## 2023-02-09 DIAGNOSIS — N1832 Chronic kidney disease, stage 3b: Secondary | ICD-10-CM | POA: Diagnosis not present

## 2023-02-09 DIAGNOSIS — G9341 Metabolic encephalopathy: Secondary | ICD-10-CM | POA: Diagnosis not present

## 2023-02-09 DIAGNOSIS — K8000 Calculus of gallbladder with acute cholecystitis without obstruction: Secondary | ICD-10-CM | POA: Diagnosis not present

## 2023-02-09 DIAGNOSIS — G936 Cerebral edema: Secondary | ICD-10-CM | POA: Diagnosis not present

## 2023-02-09 DIAGNOSIS — Z9689 Presence of other specified functional implants: Secondary | ICD-10-CM | POA: Diagnosis not present

## 2023-02-09 DIAGNOSIS — K8050 Calculus of bile duct without cholangitis or cholecystitis without obstruction: Secondary | ICD-10-CM | POA: Diagnosis not present

## 2023-02-09 DIAGNOSIS — K567 Ileus, unspecified: Secondary | ICD-10-CM | POA: Diagnosis not present

## 2023-02-09 DIAGNOSIS — M3119 Other thrombotic microangiopathy: Secondary | ICD-10-CM | POA: Diagnosis not present

## 2023-02-10 DIAGNOSIS — Z953 Presence of xenogenic heart valve: Secondary | ICD-10-CM | POA: Diagnosis not present

## 2023-02-10 DIAGNOSIS — R7881 Bacteremia: Secondary | ICD-10-CM | POA: Diagnosis not present

## 2023-02-10 DIAGNOSIS — Z4659 Encounter for fitting and adjustment of other gastrointestinal appliance and device: Secondary | ICD-10-CM | POA: Diagnosis not present

## 2023-02-10 DIAGNOSIS — D649 Anemia, unspecified: Secondary | ICD-10-CM | POA: Diagnosis not present

## 2023-02-10 DIAGNOSIS — B9561 Methicillin susceptible Staphylococcus aureus infection as the cause of diseases classified elsewhere: Secondary | ICD-10-CM | POA: Diagnosis not present

## 2023-02-10 DIAGNOSIS — K8000 Calculus of gallbladder with acute cholecystitis without obstruction: Secondary | ICD-10-CM | POA: Diagnosis not present

## 2023-02-10 DIAGNOSIS — K859 Acute pancreatitis without necrosis or infection, unspecified: Secondary | ICD-10-CM | POA: Diagnosis not present

## 2023-02-10 DIAGNOSIS — N179 Acute kidney failure, unspecified: Secondary | ICD-10-CM | POA: Diagnosis not present

## 2023-02-10 DIAGNOSIS — K8050 Calculus of bile duct without cholangitis or cholecystitis without obstruction: Secondary | ICD-10-CM | POA: Diagnosis not present

## 2023-02-10 DIAGNOSIS — K8309 Other cholangitis: Secondary | ICD-10-CM | POA: Diagnosis not present

## 2023-02-10 DIAGNOSIS — K567 Ileus, unspecified: Secondary | ICD-10-CM | POA: Diagnosis not present

## 2023-02-10 DIAGNOSIS — I079 Rheumatic tricuspid valve disease, unspecified: Secondary | ICD-10-CM | POA: Diagnosis not present

## 2023-02-10 DIAGNOSIS — G9341 Metabolic encephalopathy: Secondary | ICD-10-CM | POA: Diagnosis not present

## 2023-02-10 DIAGNOSIS — K8020 Calculus of gallbladder without cholecystitis without obstruction: Secondary | ICD-10-CM | POA: Diagnosis not present

## 2023-02-10 DIAGNOSIS — J15211 Pneumonia due to Methicillin susceptible Staphylococcus aureus: Secondary | ICD-10-CM | POA: Diagnosis not present

## 2023-02-10 DIAGNOSIS — R1310 Dysphagia, unspecified: Secondary | ICD-10-CM | POA: Diagnosis not present

## 2023-02-10 DIAGNOSIS — E8770 Fluid overload, unspecified: Secondary | ICD-10-CM | POA: Diagnosis not present

## 2023-02-10 DIAGNOSIS — M3119 Other thrombotic microangiopathy: Secondary | ICD-10-CM | POA: Diagnosis not present

## 2023-02-10 DIAGNOSIS — N1832 Chronic kidney disease, stage 3b: Secondary | ICD-10-CM | POA: Diagnosis not present

## 2023-02-10 DIAGNOSIS — I330 Acute and subacute infective endocarditis: Secondary | ICD-10-CM | POA: Diagnosis not present

## 2023-02-10 DIAGNOSIS — G928 Other toxic encephalopathy: Secondary | ICD-10-CM | POA: Diagnosis not present

## 2023-02-11 DIAGNOSIS — K567 Ileus, unspecified: Secondary | ICD-10-CM | POA: Diagnosis not present

## 2023-02-11 DIAGNOSIS — I330 Acute and subacute infective endocarditis: Secondary | ICD-10-CM | POA: Diagnosis not present

## 2023-02-11 DIAGNOSIS — R1310 Dysphagia, unspecified: Secondary | ICD-10-CM | POA: Diagnosis not present

## 2023-02-11 DIAGNOSIS — R109 Unspecified abdominal pain: Secondary | ICD-10-CM | POA: Diagnosis not present

## 2023-02-11 DIAGNOSIS — B9561 Methicillin susceptible Staphylococcus aureus infection as the cause of diseases classified elsewhere: Secondary | ICD-10-CM | POA: Diagnosis not present

## 2023-02-11 DIAGNOSIS — N179 Acute kidney failure, unspecified: Secondary | ICD-10-CM | POA: Diagnosis not present

## 2023-02-11 DIAGNOSIS — D649 Anemia, unspecified: Secondary | ICD-10-CM | POA: Diagnosis not present

## 2023-02-11 DIAGNOSIS — G928 Other toxic encephalopathy: Secondary | ICD-10-CM | POA: Diagnosis not present

## 2023-02-11 DIAGNOSIS — Z8673 Personal history of transient ischemic attack (TIA), and cerebral infarction without residual deficits: Secondary | ICD-10-CM | POA: Diagnosis not present

## 2023-02-11 DIAGNOSIS — M3119 Other thrombotic microangiopathy: Secondary | ICD-10-CM | POA: Diagnosis not present

## 2023-02-12 DIAGNOSIS — M3119 Other thrombotic microangiopathy: Secondary | ICD-10-CM | POA: Diagnosis not present

## 2023-02-13 DIAGNOSIS — M3119 Other thrombotic microangiopathy: Secondary | ICD-10-CM | POA: Diagnosis not present

## 2023-02-14 DIAGNOSIS — D696 Thrombocytopenia, unspecified: Secondary | ICD-10-CM | POA: Diagnosis not present

## 2023-02-14 DIAGNOSIS — R7881 Bacteremia: Secondary | ICD-10-CM | POA: Diagnosis not present

## 2023-02-14 DIAGNOSIS — B9561 Methicillin susceptible Staphylococcus aureus infection as the cause of diseases classified elsewhere: Secondary | ICD-10-CM | POA: Diagnosis not present

## 2023-02-14 DIAGNOSIS — M3119 Other thrombotic microangiopathy: Secondary | ICD-10-CM | POA: Diagnosis not present

## 2023-02-14 DIAGNOSIS — Z9689 Presence of other specified functional implants: Secondary | ICD-10-CM | POA: Diagnosis not present

## 2023-02-14 DIAGNOSIS — I129 Hypertensive chronic kidney disease with stage 1 through stage 4 chronic kidney disease, or unspecified chronic kidney disease: Secondary | ICD-10-CM | POA: Diagnosis not present

## 2023-02-14 DIAGNOSIS — Z9889 Other specified postprocedural states: Secondary | ICD-10-CM | POA: Diagnosis not present

## 2023-02-14 DIAGNOSIS — N179 Acute kidney failure, unspecified: Secondary | ICD-10-CM | POA: Diagnosis not present

## 2023-02-14 DIAGNOSIS — K8050 Calculus of bile duct without cholangitis or cholecystitis without obstruction: Secondary | ICD-10-CM | POA: Diagnosis not present

## 2023-02-14 DIAGNOSIS — E1122 Type 2 diabetes mellitus with diabetic chronic kidney disease: Secondary | ICD-10-CM | POA: Diagnosis not present

## 2023-02-14 DIAGNOSIS — I76 Septic arterial embolism: Secondary | ICD-10-CM | POA: Diagnosis not present

## 2023-02-14 DIAGNOSIS — Z952 Presence of prosthetic heart valve: Secondary | ICD-10-CM | POA: Diagnosis not present

## 2023-02-14 DIAGNOSIS — K851 Biliary acute pancreatitis without necrosis or infection: Secondary | ICD-10-CM | POA: Diagnosis not present

## 2023-02-14 DIAGNOSIS — I079 Rheumatic tricuspid valve disease, unspecified: Secondary | ICD-10-CM | POA: Diagnosis not present

## 2023-02-14 DIAGNOSIS — N1832 Chronic kidney disease, stage 3b: Secondary | ICD-10-CM | POA: Diagnosis not present

## 2023-02-14 DIAGNOSIS — R6521 Severe sepsis with septic shock: Secondary | ICD-10-CM | POA: Diagnosis not present

## 2023-02-14 DIAGNOSIS — R109 Unspecified abdominal pain: Secondary | ICD-10-CM | POA: Diagnosis not present

## 2023-02-15 DIAGNOSIS — N183 Chronic kidney disease, stage 3 unspecified: Secondary | ICD-10-CM | POA: Insufficient documentation

## 2023-02-15 DIAGNOSIS — R7881 Bacteremia: Secondary | ICD-10-CM | POA: Diagnosis not present

## 2023-02-15 DIAGNOSIS — E1122 Type 2 diabetes mellitus with diabetic chronic kidney disease: Secondary | ICD-10-CM | POA: Diagnosis not present

## 2023-02-15 DIAGNOSIS — N179 Acute kidney failure, unspecified: Secondary | ICD-10-CM | POA: Diagnosis not present

## 2023-02-15 DIAGNOSIS — M3119 Other thrombotic microangiopathy: Secondary | ICD-10-CM | POA: Diagnosis not present

## 2023-02-15 DIAGNOSIS — Z8673 Personal history of transient ischemic attack (TIA), and cerebral infarction without residual deficits: Secondary | ICD-10-CM | POA: Insufficient documentation

## 2023-02-15 DIAGNOSIS — Z992 Dependence on renal dialysis: Secondary | ICD-10-CM | POA: Insufficient documentation

## 2023-02-15 DIAGNOSIS — R6521 Severe sepsis with septic shock: Secondary | ICD-10-CM | POA: Diagnosis not present

## 2023-02-15 DIAGNOSIS — N1832 Chronic kidney disease, stage 3b: Secondary | ICD-10-CM | POA: Diagnosis not present

## 2023-02-15 DIAGNOSIS — D696 Thrombocytopenia, unspecified: Secondary | ICD-10-CM | POA: Diagnosis not present

## 2023-02-15 DIAGNOSIS — I079 Rheumatic tricuspid valve disease, unspecified: Secondary | ICD-10-CM | POA: Diagnosis not present

## 2023-02-15 DIAGNOSIS — R109 Unspecified abdominal pain: Secondary | ICD-10-CM | POA: Diagnosis not present

## 2023-02-15 DIAGNOSIS — I76 Septic arterial embolism: Secondary | ICD-10-CM | POA: Diagnosis not present

## 2023-02-15 DIAGNOSIS — I129 Hypertensive chronic kidney disease with stage 1 through stage 4 chronic kidney disease, or unspecified chronic kidney disease: Secondary | ICD-10-CM | POA: Diagnosis not present

## 2023-02-15 DIAGNOSIS — Z952 Presence of prosthetic heart valve: Secondary | ICD-10-CM | POA: Diagnosis not present

## 2023-02-15 DIAGNOSIS — B9561 Methicillin susceptible Staphylococcus aureus infection as the cause of diseases classified elsewhere: Secondary | ICD-10-CM | POA: Diagnosis not present

## 2023-02-16 DIAGNOSIS — M3119 Other thrombotic microangiopathy: Secondary | ICD-10-CM | POA: Diagnosis not present

## 2023-02-16 DIAGNOSIS — N1832 Chronic kidney disease, stage 3b: Secondary | ICD-10-CM | POA: Diagnosis not present

## 2023-02-17 DIAGNOSIS — N1832 Chronic kidney disease, stage 3b: Secondary | ICD-10-CM | POA: Diagnosis not present

## 2023-02-17 DIAGNOSIS — Z952 Presence of prosthetic heart valve: Secondary | ICD-10-CM | POA: Diagnosis not present

## 2023-02-17 DIAGNOSIS — R7881 Bacteremia: Secondary | ICD-10-CM | POA: Diagnosis not present

## 2023-02-17 DIAGNOSIS — I129 Hypertensive chronic kidney disease with stage 1 through stage 4 chronic kidney disease, or unspecified chronic kidney disease: Secondary | ICD-10-CM | POA: Diagnosis not present

## 2023-02-17 DIAGNOSIS — N179 Acute kidney failure, unspecified: Secondary | ICD-10-CM | POA: Diagnosis not present

## 2023-02-17 DIAGNOSIS — R109 Unspecified abdominal pain: Secondary | ICD-10-CM | POA: Diagnosis not present

## 2023-02-17 DIAGNOSIS — M3119 Other thrombotic microangiopathy: Secondary | ICD-10-CM | POA: Diagnosis not present

## 2023-02-17 DIAGNOSIS — I079 Rheumatic tricuspid valve disease, unspecified: Secondary | ICD-10-CM | POA: Diagnosis not present

## 2023-02-17 DIAGNOSIS — R6521 Severe sepsis with septic shock: Secondary | ICD-10-CM | POA: Diagnosis not present

## 2023-02-17 DIAGNOSIS — E1122 Type 2 diabetes mellitus with diabetic chronic kidney disease: Secondary | ICD-10-CM | POA: Diagnosis not present

## 2023-02-17 DIAGNOSIS — I76 Septic arterial embolism: Secondary | ICD-10-CM | POA: Diagnosis not present

## 2023-02-17 DIAGNOSIS — D696 Thrombocytopenia, unspecified: Secondary | ICD-10-CM | POA: Diagnosis not present

## 2023-02-17 DIAGNOSIS — B9561 Methicillin susceptible Staphylococcus aureus infection as the cause of diseases classified elsewhere: Secondary | ICD-10-CM | POA: Diagnosis not present

## 2023-02-18 DIAGNOSIS — M3119 Other thrombotic microangiopathy: Secondary | ICD-10-CM | POA: Diagnosis not present

## 2023-02-19 DIAGNOSIS — M3119 Other thrombotic microangiopathy: Secondary | ICD-10-CM | POA: Diagnosis not present

## 2023-02-20 DIAGNOSIS — E785 Hyperlipidemia, unspecified: Secondary | ICD-10-CM | POA: Diagnosis not present

## 2023-02-20 DIAGNOSIS — M3119 Other thrombotic microangiopathy: Secondary | ICD-10-CM | POA: Diagnosis not present

## 2023-02-20 DIAGNOSIS — N19 Unspecified kidney failure: Secondary | ICD-10-CM | POA: Diagnosis not present

## 2023-02-20 DIAGNOSIS — I10 Essential (primary) hypertension: Secondary | ICD-10-CM | POA: Diagnosis not present

## 2023-02-20 DIAGNOSIS — M109 Gout, unspecified: Secondary | ICD-10-CM | POA: Diagnosis not present

## 2023-02-20 DIAGNOSIS — I739 Peripheral vascular disease, unspecified: Secondary | ICD-10-CM | POA: Diagnosis not present

## 2023-02-20 DIAGNOSIS — E119 Type 2 diabetes mellitus without complications: Secondary | ICD-10-CM | POA: Diagnosis not present

## 2023-02-21 DIAGNOSIS — R6521 Severe sepsis with septic shock: Secondary | ICD-10-CM | POA: Diagnosis not present

## 2023-02-21 DIAGNOSIS — D696 Thrombocytopenia, unspecified: Secondary | ICD-10-CM | POA: Diagnosis not present

## 2023-02-21 DIAGNOSIS — E1122 Type 2 diabetes mellitus with diabetic chronic kidney disease: Secondary | ICD-10-CM | POA: Diagnosis not present

## 2023-02-21 DIAGNOSIS — B9561 Methicillin susceptible Staphylococcus aureus infection as the cause of diseases classified elsewhere: Secondary | ICD-10-CM | POA: Diagnosis not present

## 2023-02-21 DIAGNOSIS — N1832 Chronic kidney disease, stage 3b: Secondary | ICD-10-CM | POA: Diagnosis not present

## 2023-02-21 DIAGNOSIS — Z952 Presence of prosthetic heart valve: Secondary | ICD-10-CM | POA: Diagnosis not present

## 2023-02-21 DIAGNOSIS — M3119 Other thrombotic microangiopathy: Secondary | ICD-10-CM | POA: Diagnosis not present

## 2023-02-21 DIAGNOSIS — I129 Hypertensive chronic kidney disease with stage 1 through stage 4 chronic kidney disease, or unspecified chronic kidney disease: Secondary | ICD-10-CM | POA: Diagnosis not present

## 2023-02-21 DIAGNOSIS — I76 Septic arterial embolism: Secondary | ICD-10-CM | POA: Diagnosis not present

## 2023-02-21 DIAGNOSIS — I079 Rheumatic tricuspid valve disease, unspecified: Secondary | ICD-10-CM | POA: Diagnosis not present

## 2023-02-21 DIAGNOSIS — K8050 Calculus of bile duct without cholangitis or cholecystitis without obstruction: Secondary | ICD-10-CM | POA: Diagnosis not present

## 2023-02-21 DIAGNOSIS — R109 Unspecified abdominal pain: Secondary | ICD-10-CM | POA: Diagnosis not present

## 2023-02-21 DIAGNOSIS — R7881 Bacteremia: Secondary | ICD-10-CM | POA: Diagnosis not present

## 2023-02-22 DIAGNOSIS — Z86718 Personal history of other venous thrombosis and embolism: Secondary | ICD-10-CM | POA: Diagnosis not present

## 2023-02-22 DIAGNOSIS — R7881 Bacteremia: Secondary | ICD-10-CM | POA: Diagnosis present

## 2023-02-22 DIAGNOSIS — E118 Type 2 diabetes mellitus with unspecified complications: Secondary | ICD-10-CM | POA: Diagnosis not present

## 2023-02-22 DIAGNOSIS — R531 Weakness: Secondary | ICD-10-CM | POA: Diagnosis not present

## 2023-02-22 DIAGNOSIS — Z992 Dependence on renal dialysis: Secondary | ICD-10-CM | POA: Diagnosis not present

## 2023-02-22 DIAGNOSIS — Z79899 Other long term (current) drug therapy: Secondary | ICD-10-CM | POA: Diagnosis not present

## 2023-02-22 DIAGNOSIS — Z951 Presence of aortocoronary bypass graft: Secondary | ICD-10-CM | POA: Diagnosis not present

## 2023-02-22 DIAGNOSIS — R279 Unspecified lack of coordination: Secondary | ICD-10-CM | POA: Diagnosis not present

## 2023-02-22 DIAGNOSIS — Z89021 Acquired absence of right finger(s): Secondary | ICD-10-CM | POA: Diagnosis not present

## 2023-02-22 DIAGNOSIS — B9561 Methicillin susceptible Staphylococcus aureus infection as the cause of diseases classified elsewhere: Secondary | ICD-10-CM | POA: Diagnosis present

## 2023-02-22 DIAGNOSIS — M6281 Muscle weakness (generalized): Secondary | ICD-10-CM | POA: Diagnosis not present

## 2023-02-22 DIAGNOSIS — I330 Acute and subacute infective endocarditis: Secondary | ICD-10-CM | POA: Diagnosis not present

## 2023-02-22 DIAGNOSIS — I120 Hypertensive chronic kidney disease with stage 5 chronic kidney disease or end stage renal disease: Secondary | ICD-10-CM | POA: Diagnosis present

## 2023-02-22 DIAGNOSIS — M3119 Other thrombotic microangiopathy: Secondary | ICD-10-CM | POA: Diagnosis not present

## 2023-02-22 DIAGNOSIS — N170 Acute kidney failure with tubular necrosis: Secondary | ICD-10-CM | POA: Diagnosis not present

## 2023-02-22 DIAGNOSIS — D696 Thrombocytopenia, unspecified: Secondary | ICD-10-CM | POA: Diagnosis not present

## 2023-02-22 DIAGNOSIS — I69321 Dysphasia following cerebral infarction: Secondary | ICD-10-CM | POA: Diagnosis not present

## 2023-02-22 DIAGNOSIS — I252 Old myocardial infarction: Secondary | ICD-10-CM | POA: Diagnosis not present

## 2023-02-22 DIAGNOSIS — D649 Anemia, unspecified: Secondary | ICD-10-CM | POA: Diagnosis not present

## 2023-02-22 DIAGNOSIS — E785 Hyperlipidemia, unspecified: Secondary | ICD-10-CM | POA: Diagnosis not present

## 2023-02-22 DIAGNOSIS — N186 End stage renal disease: Secondary | ICD-10-CM | POA: Diagnosis present

## 2023-02-22 DIAGNOSIS — I4891 Unspecified atrial fibrillation: Secondary | ICD-10-CM | POA: Diagnosis not present

## 2023-02-22 DIAGNOSIS — N185 Chronic kidney disease, stage 5: Secondary | ICD-10-CM | POA: Diagnosis not present

## 2023-02-22 DIAGNOSIS — N1832 Chronic kidney disease, stage 3b: Secondary | ICD-10-CM | POA: Diagnosis not present

## 2023-02-22 DIAGNOSIS — Z953 Presence of xenogenic heart valve: Secondary | ICD-10-CM | POA: Diagnosis not present

## 2023-02-22 DIAGNOSIS — R040 Epistaxis: Secondary | ICD-10-CM | POA: Diagnosis not present

## 2023-02-22 DIAGNOSIS — E1122 Type 2 diabetes mellitus with diabetic chronic kidney disease: Secondary | ICD-10-CM | POA: Diagnosis present

## 2023-02-22 DIAGNOSIS — K861 Other chronic pancreatitis: Secondary | ICD-10-CM | POA: Diagnosis not present

## 2023-02-22 DIAGNOSIS — Z743 Need for continuous supervision: Secondary | ICD-10-CM | POA: Diagnosis not present

## 2023-02-22 DIAGNOSIS — Z8673 Personal history of transient ischemic attack (TIA), and cerebral infarction without residual deficits: Secondary | ICD-10-CM | POA: Diagnosis not present

## 2023-02-22 DIAGNOSIS — Z8674 Personal history of sudden cardiac arrest: Secondary | ICD-10-CM | POA: Diagnosis not present

## 2023-02-22 DIAGNOSIS — N179 Acute kidney failure, unspecified: Secondary | ICD-10-CM | POA: Diagnosis not present

## 2023-02-22 DIAGNOSIS — I10 Essential (primary) hypertension: Secondary | ICD-10-CM | POA: Diagnosis not present

## 2023-02-22 DIAGNOSIS — M109 Gout, unspecified: Secondary | ICD-10-CM | POA: Diagnosis not present

## 2023-02-22 DIAGNOSIS — D62 Acute posthemorrhagic anemia: Secondary | ICD-10-CM | POA: Diagnosis present

## 2023-02-22 DIAGNOSIS — R34 Anuria and oliguria: Secondary | ICD-10-CM | POA: Diagnosis not present

## 2023-02-22 DIAGNOSIS — D631 Anemia in chronic kidney disease: Secondary | ICD-10-CM | POA: Diagnosis not present

## 2023-02-22 DIAGNOSIS — R2681 Unsteadiness on feet: Secondary | ICD-10-CM | POA: Diagnosis not present

## 2023-02-22 DIAGNOSIS — I25119 Atherosclerotic heart disease of native coronary artery with unspecified angina pectoris: Secondary | ICD-10-CM | POA: Diagnosis not present

## 2023-02-22 DIAGNOSIS — K8050 Calculus of bile duct without cholangitis or cholecystitis without obstruction: Secondary | ICD-10-CM | POA: Diagnosis not present

## 2023-02-22 DIAGNOSIS — E7800 Pure hypercholesterolemia, unspecified: Secondary | ICD-10-CM | POA: Diagnosis present

## 2023-02-22 DIAGNOSIS — M940 Chondrocostal junction syndrome [Tietze]: Secondary | ICD-10-CM | POA: Diagnosis not present

## 2023-02-22 DIAGNOSIS — I2510 Atherosclerotic heart disease of native coronary artery without angina pectoris: Secondary | ICD-10-CM | POA: Diagnosis present

## 2023-02-22 DIAGNOSIS — K851 Biliary acute pancreatitis without necrosis or infection: Secondary | ICD-10-CM | POA: Diagnosis not present

## 2023-02-22 DIAGNOSIS — Z885 Allergy status to narcotic agent status: Secondary | ICD-10-CM | POA: Diagnosis not present

## 2023-02-22 DIAGNOSIS — N2581 Secondary hyperparathyroidism of renal origin: Secondary | ICD-10-CM | POA: Diagnosis present

## 2023-02-23 DIAGNOSIS — N1832 Chronic kidney disease, stage 3b: Secondary | ICD-10-CM | POA: Diagnosis not present

## 2023-02-23 DIAGNOSIS — N179 Acute kidney failure, unspecified: Secondary | ICD-10-CM | POA: Diagnosis not present

## 2023-02-23 DIAGNOSIS — K8050 Calculus of bile duct without cholangitis or cholecystitis without obstruction: Secondary | ICD-10-CM | POA: Diagnosis not present

## 2023-02-23 DIAGNOSIS — R34 Anuria and oliguria: Secondary | ICD-10-CM | POA: Diagnosis not present

## 2023-02-23 DIAGNOSIS — K851 Biliary acute pancreatitis without necrosis or infection: Secondary | ICD-10-CM | POA: Diagnosis not present

## 2023-02-23 DIAGNOSIS — I4891 Unspecified atrial fibrillation: Secondary | ICD-10-CM | POA: Diagnosis not present

## 2023-02-23 DIAGNOSIS — I330 Acute and subacute infective endocarditis: Secondary | ICD-10-CM | POA: Diagnosis not present

## 2023-02-23 DIAGNOSIS — M940 Chondrocostal junction syndrome [Tietze]: Secondary | ICD-10-CM | POA: Diagnosis not present

## 2023-02-24 DIAGNOSIS — N170 Acute kidney failure with tubular necrosis: Secondary | ICD-10-CM | POA: Diagnosis not present

## 2023-02-24 DIAGNOSIS — Z992 Dependence on renal dialysis: Secondary | ICD-10-CM | POA: Diagnosis not present

## 2023-02-25 ENCOUNTER — Encounter (HOSPITAL_COMMUNITY): Payer: Self-pay

## 2023-02-25 ENCOUNTER — Inpatient Hospital Stay (HOSPITAL_COMMUNITY)
Admission: EM | Admit: 2023-02-25 | Discharge: 2023-02-28 | DRG: 811 | Disposition: A | Discharge status: Skilled Nursing Facility | Payer: PPO | Source: Skilled Nursing Facility | Attending: Internal Medicine | Admitting: Internal Medicine

## 2023-02-25 ENCOUNTER — Other Ambulatory Visit: Payer: Self-pay

## 2023-02-25 DIAGNOSIS — R040 Epistaxis: Secondary | ICD-10-CM | POA: Diagnosis present

## 2023-02-25 DIAGNOSIS — Z89021 Acquired absence of right finger(s): Secondary | ICD-10-CM

## 2023-02-25 DIAGNOSIS — N186 End stage renal disease: Secondary | ICD-10-CM | POA: Diagnosis present

## 2023-02-25 DIAGNOSIS — Z951 Presence of aortocoronary bypass graft: Secondary | ICD-10-CM

## 2023-02-25 DIAGNOSIS — E7800 Pure hypercholesterolemia, unspecified: Secondary | ICD-10-CM | POA: Diagnosis present

## 2023-02-25 DIAGNOSIS — R7881 Bacteremia: Secondary | ICD-10-CM | POA: Diagnosis present

## 2023-02-25 DIAGNOSIS — Z888 Allergy status to other drugs, medicaments and biological substances status: Secondary | ICD-10-CM

## 2023-02-25 DIAGNOSIS — I252 Old myocardial infarction: Secondary | ICD-10-CM

## 2023-02-25 DIAGNOSIS — D62 Acute posthemorrhagic anemia: Secondary | ICD-10-CM | POA: Diagnosis not present

## 2023-02-25 DIAGNOSIS — Z8249 Family history of ischemic heart disease and other diseases of the circulatory system: Secondary | ICD-10-CM

## 2023-02-25 DIAGNOSIS — D696 Thrombocytopenia, unspecified: Secondary | ICD-10-CM | POA: Diagnosis present

## 2023-02-25 DIAGNOSIS — Z7984 Long term (current) use of oral hypoglycemic drugs: Secondary | ICD-10-CM

## 2023-02-25 DIAGNOSIS — D539 Nutritional anemia, unspecified: Secondary | ICD-10-CM | POA: Diagnosis present

## 2023-02-25 DIAGNOSIS — D649 Anemia, unspecified: Secondary | ICD-10-CM | POA: Diagnosis present

## 2023-02-25 DIAGNOSIS — N2581 Secondary hyperparathyroidism of renal origin: Secondary | ICD-10-CM | POA: Diagnosis present

## 2023-02-25 DIAGNOSIS — Z86718 Personal history of other venous thrombosis and embolism: Secondary | ICD-10-CM

## 2023-02-25 DIAGNOSIS — I4510 Unspecified right bundle-branch block: Secondary | ICD-10-CM | POA: Diagnosis present

## 2023-02-25 DIAGNOSIS — B9561 Methicillin susceptible Staphylococcus aureus infection as the cause of diseases classified elsewhere: Secondary | ICD-10-CM | POA: Diagnosis present

## 2023-02-25 DIAGNOSIS — F419 Anxiety disorder, unspecified: Secondary | ICD-10-CM | POA: Diagnosis present

## 2023-02-25 DIAGNOSIS — M109 Gout, unspecified: Secondary | ICD-10-CM | POA: Diagnosis present

## 2023-02-25 DIAGNOSIS — Z992 Dependence on renal dialysis: Secondary | ICD-10-CM

## 2023-02-25 DIAGNOSIS — E1122 Type 2 diabetes mellitus with diabetic chronic kidney disease: Secondary | ICD-10-CM | POA: Diagnosis present

## 2023-02-25 DIAGNOSIS — K219 Gastro-esophageal reflux disease without esophagitis: Secondary | ICD-10-CM | POA: Diagnosis present

## 2023-02-25 DIAGNOSIS — I10 Essential (primary) hypertension: Secondary | ICD-10-CM | POA: Diagnosis not present

## 2023-02-25 DIAGNOSIS — Z953 Presence of xenogenic heart valve: Secondary | ICD-10-CM

## 2023-02-25 DIAGNOSIS — Z8674 Personal history of sudden cardiac arrest: Secondary | ICD-10-CM

## 2023-02-25 DIAGNOSIS — T7840XA Allergy, unspecified, initial encounter: Secondary | ICD-10-CM | POA: Insufficient documentation

## 2023-02-25 DIAGNOSIS — T782XXA Anaphylactic shock, unspecified, initial encounter: Secondary | ICD-10-CM | POA: Insufficient documentation

## 2023-02-25 DIAGNOSIS — E8770 Fluid overload, unspecified: Secondary | ICD-10-CM | POA: Diagnosis present

## 2023-02-25 DIAGNOSIS — R32 Unspecified urinary incontinence: Secondary | ICD-10-CM | POA: Diagnosis present

## 2023-02-25 DIAGNOSIS — M940 Chondrocostal junction syndrome [Tietze]: Secondary | ICD-10-CM | POA: Diagnosis not present

## 2023-02-25 DIAGNOSIS — Z8673 Personal history of transient ischemic attack (TIA), and cerebral infarction without residual deficits: Secondary | ICD-10-CM

## 2023-02-25 DIAGNOSIS — I120 Hypertensive chronic kidney disease with stage 5 chronic kidney disease or end stage renal disease: Secondary | ICD-10-CM | POA: Diagnosis present

## 2023-02-25 DIAGNOSIS — I2510 Atherosclerotic heart disease of native coronary artery without angina pectoris: Secondary | ICD-10-CM | POA: Diagnosis present

## 2023-02-25 DIAGNOSIS — I4891 Unspecified atrial fibrillation: Secondary | ICD-10-CM | POA: Diagnosis present

## 2023-02-25 DIAGNOSIS — Z885 Allergy status to narcotic agent status: Secondary | ICD-10-CM

## 2023-02-25 DIAGNOSIS — Z79899 Other long term (current) drug therapy: Secondary | ICD-10-CM

## 2023-02-25 DIAGNOSIS — Z8619 Personal history of other infectious and parasitic diseases: Secondary | ICD-10-CM

## 2023-02-25 DIAGNOSIS — D631 Anemia in chronic kidney disease: Secondary | ICD-10-CM | POA: Diagnosis present

## 2023-02-25 DIAGNOSIS — Z833 Family history of diabetes mellitus: Secondary | ICD-10-CM

## 2023-02-25 DIAGNOSIS — R059 Cough, unspecified: Secondary | ICD-10-CM | POA: Diagnosis present

## 2023-02-25 NOTE — ED Provider Notes (Signed)
Emergency Department Provider Note   I have reviewed the triage vital signs and the nursing notes.   HISTORY  Chief Complaint Epistaxis   HPI [name] is a 62 y.o. male with PMH of HLD, HTN, DM, ESRD, and anemia presents to the ED epistaxis. He is currently at a SNF following d/c from the hospital on 01/29/23 for sepsis, PEA arrest, and TTP. Patient tells me that today he had issue with feeling a "thick" material draining down the back of his throat and some small volume blood from the nose. This stopped without intervention but given history he was sent from his facility for evaluation. Denies any anticoagulation. No nose injury. No AMS. Due for next HD in the AM.    Past Medical History:  Diagnosis Date   Allergy    Anemia    Anxiety    Baker's cyst of knee    Blood transfusion without reported diagnosis    as baby    Coronary artery disease    quadruple bypass - March 2016   GERD (gastroesophageal reflux disease)    Gouty arthritis    "real bad" (01/17/2013)   Heart murmur    Hypercholesteremia    Hypertension    Myocardial infarction (HCC) 2017   Stroke (HCC)    Type II diabetes mellitus (HCC)     Review of Systems  Constitutional: No fever/chills Eyes: No visual changes. ENT: Epistaxis (resolved).  Cardiovascular: Denies chest pain. Respiratory: Denies shortness of breath. Gastrointestinal: No abdominal pain.  Genitourinary: Negative for dysuria. Musculoskeletal: Negative for back pain. Skin: Negative for rash. Neurological: Negative for headaches, focal weakness or numbness.   ____________________________________________   PHYSICAL EXAM:  VITAL SIGNS: ED Triage Vitals  Enc Vitals Group     BP 02/25/23 2328 99/75     Pulse Rate 02/25/23 2328 77     Resp 02/25/23 2328 18     Temp 02/25/23 2328 98.7 F (37.1 C)     Temp Source 02/25/23 2328 Oral     SpO2 02/25/23 2328 95 %     Weight 02/25/23 2326 212 lb 15.4 oz (96.6 kg)     Height  02/25/23 2326 5\' 6"  (1.676 m)   Constitutional: Alert and oriented. Well appearing and in no acute distress. Eyes: Conjunctivae are normal.  Head: Atraumatic. Nose: No congestion/rhinnorhea. No active epistaxis.  Mouth/Throat: Mucous membranes are moist.  Oropharynx non-erythematous. No visible blood in the posterior pharynx.  Neck: No stridor.   Cardiovascular: Normal rate, regular rhythm. Good peripheral circulation. Grossly normal heart sounds.   Respiratory: Normal respiratory effort.  No retractions. Lungs CTAB. Gastrointestinal: Soft and nontender. No distention.  Musculoskeletal: No lower extremity tenderness nor edema. No gross deformities of extremities. Neurologic:  Normal speech and language.  Skin:  Skin is warm, dry and intact. No rash noted.  ____________________________________________   LABS (all labs ordered are listed, but only abnormal results are displayed)  Labs Reviewed  BASIC METABOLIC PANEL - Abnormal; Notable for the following components:      Result Value   Sodium 132 (*)    Chloride 93 (*)    Glucose, Bld 112 (*)    Creatinine, Ser 5.15 (*)    Calcium 8.4 (*)    GFR, Estimated 12 (*)    All other components within normal limits  CBC WITH DIFFERENTIAL/PLATELET - Abnormal; Notable for the following components:   WBC 10.8 (*)    RBC 2.07 (*)    Hemoglobin 6.5 (*)  HCT 20.7 (*)    RDW 23.0 (*)    Platelets 138 (*)    Monocytes Absolute 5.2 (*)    Abs Immature Granulocytes 0.30 (*)    All other components within normal limits  HEPATITIS B SURFACE ANTIGEN  HEPATITIS B SURFACE ANTIBODY, QUANTITATIVE  TYPE AND SCREEN   ____________________________________________  EKG   EKG Interpretation  Date/Time:  Friday February 25 2023 23:27:40 EDT Ventricular Rate:  81 PR Interval:    QRS Duration: 145 QT Interval:  439 QTC Calculation: 510 R Axis:   18 Text Interpretation: Atrial fibrillation Right bundle branch block Confirmed by Alona Bene [phone])  on 02/25/2023 11:43:59 PM        ____________________________________________   PROCEDURES  Procedure(s) performed:   Procedures  CRITICAL CARE Performed by: Maia Plan Total critical care time: 35 minutes Critical care time was exclusive of separately billable procedures and treating other patients. Critical care was necessary to treat or prevent imminent or life-threatening deterioration. Critical care was time spent personally by me on the following activities: development of treatment plan with patient and/or surrogate as well as nursing, discussions with consultants, evaluation of patient's response to treatment, examination of patient, obtaining history from patient or surrogate, ordering and performing treatments and interventions, ordering and review of laboratory studies, ordering and review of radiographic studies, pulse oximetry and re-evaluation of patient's condition.  Alona Bene, MD Emergency Medicine  ____________________________________________   INITIAL IMPRESSION / ASSESSMENT AND PLAN / ED COURSE  Pertinent labs & imaging results that were available during my care of the patient were reviewed by me and considered in my medical decision making (see chart for details).   This patient is Presenting for Evaluation of epistaxis, which does require a range of treatment options, and is a complaint that involves a high risk of morbidity and mortality.  The Differential Diagnoses include anterior epistaxis, posterior epistaxis, TTP, symptomatic anemia, etc.  Critical Interventions-    Medications  Chlorhexidine Gluconate Cloth 2 % PADS 6 each (has no administration in time range)    Reassessment after intervention:  patient remains HDS.   I decided to review pertinent External Data, and in summary admit in May for TTP, sepsis, and PEA arrest during that admission.   Clinical Laboratory Tests Ordered, included CBC showing worsening anemia to 6.5. Normal K.    Cardiac Monitor Tracing which shows A fib.    Social Determinants of Health Risk patient is a non-smoker.  Consult complete with Nephrology, Dr. Signe Colt. Plan for HD in the AM with PRBC to be given during that session.   TRH. Plan for admit.   Medical Decision Making: Summary:  Patient arrives to the ED via EMS for evaluation of epistaxis. No active bleeding at this time. Will need screening CBC with recent TTP. Epistaxis sounds fairly small volume based on history.   Reevaluation with update and discussion with patient. No return of epistaxis. Agrees with plan for obs admit and HD w/ PRBC in the AM.   Patient's presentation is most consistent with acute presentation with potential threat to life or bodily function.   Disposition: admit  ____________________________________________  FINAL CLINICAL IMPRESSION(S) / ED DIAGNOSES  Final diagnoses:  Anemia, unspecified type  Epistaxis    Note:  This document was prepared using Dragon voice recognition software and may include unintentional dictation errors.  Alona Bene, MD, [facility] Emergency Medicine    Dynastie Knoop, Arlyss Repress, MD 02/26/23 [phone]

## 2023-02-25 NOTE — ED Triage Notes (Addendum)
Pt BIB [facility] from [facility] d/t nose bleed - pt "had post nasal bleeding with little from nares" .  Bleeding ceased there but they wanted him "checked out".  Pt is not on blood thinners per EMS.   Pt has Dialysis at 6:30 am

## 2023-02-26 DIAGNOSIS — E7800 Pure hypercholesterolemia, unspecified: Secondary | ICD-10-CM | POA: Diagnosis not present

## 2023-02-26 DIAGNOSIS — Z86718 Personal history of other venous thrombosis and embolism: Secondary | ICD-10-CM | POA: Diagnosis not present

## 2023-02-26 DIAGNOSIS — N185 Chronic kidney disease, stage 5: Secondary | ICD-10-CM

## 2023-02-26 DIAGNOSIS — D62 Acute posthemorrhagic anemia: Secondary | ICD-10-CM | POA: Diagnosis not present

## 2023-02-26 DIAGNOSIS — N1831 Chronic kidney disease, stage 3a: Secondary | ICD-10-CM | POA: Diagnosis not present

## 2023-02-26 DIAGNOSIS — D696 Thrombocytopenia, unspecified: Secondary | ICD-10-CM | POA: Diagnosis not present

## 2023-02-26 DIAGNOSIS — Z953 Presence of xenogenic heart valve: Secondary | ICD-10-CM | POA: Diagnosis not present

## 2023-02-26 DIAGNOSIS — R040 Epistaxis: Secondary | ICD-10-CM | POA: Diagnosis not present

## 2023-02-26 DIAGNOSIS — R7881 Bacteremia: Secondary | ICD-10-CM | POA: Diagnosis not present

## 2023-02-26 DIAGNOSIS — N170 Acute kidney failure with tubular necrosis: Secondary | ICD-10-CM | POA: Diagnosis not present

## 2023-02-26 DIAGNOSIS — M109 Gout, unspecified: Secondary | ICD-10-CM | POA: Diagnosis not present

## 2023-02-26 DIAGNOSIS — B9561 Methicillin susceptible Staphylococcus aureus infection as the cause of diseases classified elsewhere: Secondary | ICD-10-CM | POA: Diagnosis not present

## 2023-02-26 DIAGNOSIS — E1122 Type 2 diabetes mellitus with diabetic chronic kidney disease: Secondary | ICD-10-CM | POA: Diagnosis not present

## 2023-02-26 DIAGNOSIS — Z951 Presence of aortocoronary bypass graft: Secondary | ICD-10-CM | POA: Diagnosis not present

## 2023-02-26 DIAGNOSIS — N2581 Secondary hyperparathyroidism of renal origin: Secondary | ICD-10-CM | POA: Diagnosis not present

## 2023-02-26 DIAGNOSIS — R059 Cough, unspecified: Secondary | ICD-10-CM | POA: Diagnosis not present

## 2023-02-26 DIAGNOSIS — Z8674 Personal history of sudden cardiac arrest: Secondary | ICD-10-CM | POA: Diagnosis not present

## 2023-02-26 DIAGNOSIS — I2510 Atherosclerotic heart disease of native coronary artery without angina pectoris: Secondary | ICD-10-CM | POA: Diagnosis not present

## 2023-02-26 DIAGNOSIS — M6281 Muscle weakness (generalized): Secondary | ICD-10-CM | POA: Diagnosis not present

## 2023-02-26 DIAGNOSIS — D631 Anemia in chronic kidney disease: Secondary | ICD-10-CM

## 2023-02-26 DIAGNOSIS — I10 Essential (primary) hypertension: Secondary | ICD-10-CM | POA: Diagnosis not present

## 2023-02-26 DIAGNOSIS — D649 Anemia, unspecified: Secondary | ICD-10-CM | POA: Diagnosis present

## 2023-02-26 DIAGNOSIS — Z8673 Personal history of transient ischemic attack (TIA), and cerebral infarction without residual deficits: Secondary | ICD-10-CM | POA: Diagnosis not present

## 2023-02-26 DIAGNOSIS — I120 Hypertensive chronic kidney disease with stage 5 chronic kidney disease or end stage renal disease: Secondary | ICD-10-CM | POA: Diagnosis not present

## 2023-02-26 DIAGNOSIS — I4891 Unspecified atrial fibrillation: Secondary | ICD-10-CM | POA: Diagnosis not present

## 2023-02-26 DIAGNOSIS — T826XXS Infection and inflammatory reaction due to cardiac valve prosthesis, sequela: Secondary | ICD-10-CM | POA: Diagnosis not present

## 2023-02-26 DIAGNOSIS — N186 End stage renal disease: Secondary | ICD-10-CM | POA: Diagnosis not present

## 2023-02-26 DIAGNOSIS — Z89021 Acquired absence of right finger(s): Secondary | ICD-10-CM | POA: Diagnosis not present

## 2023-02-26 DIAGNOSIS — Z885 Allergy status to narcotic agent status: Secondary | ICD-10-CM | POA: Diagnosis not present

## 2023-02-26 DIAGNOSIS — I368 Other nonrheumatic tricuspid valve disorders: Secondary | ICD-10-CM | POA: Diagnosis not present

## 2023-02-26 DIAGNOSIS — I252 Old myocardial infarction: Secondary | ICD-10-CM | POA: Diagnosis not present

## 2023-02-26 DIAGNOSIS — R2689 Other abnormalities of gait and mobility: Secondary | ICD-10-CM | POA: Diagnosis not present

## 2023-02-26 DIAGNOSIS — D539 Nutritional anemia, unspecified: Secondary | ICD-10-CM | POA: Diagnosis present

## 2023-02-26 DIAGNOSIS — Z992 Dependence on renal dialysis: Secondary | ICD-10-CM | POA: Diagnosis not present

## 2023-02-26 DIAGNOSIS — R41841 Cognitive communication deficit: Secondary | ICD-10-CM | POA: Diagnosis not present

## 2023-02-26 DIAGNOSIS — E1121 Type 2 diabetes mellitus with diabetic nephropathy: Secondary | ICD-10-CM | POA: Diagnosis not present

## 2023-02-26 DIAGNOSIS — I129 Hypertensive chronic kidney disease with stage 1 through stage 4 chronic kidney disease, or unspecified chronic kidney disease: Secondary | ICD-10-CM | POA: Diagnosis not present

## 2023-02-26 DIAGNOSIS — Z79899 Other long term (current) drug therapy: Secondary | ICD-10-CM | POA: Diagnosis not present

## 2023-02-26 LAB — RENAL FUNCTION PANEL
Albumin: 1.5 g/dL — ABNORMAL LOW (ref 3.5–5.0)
Anion gap: 11 (ref 5–15)
BUN: 18 mg/dL (ref 8–23)
CO2: 25 mmol/L (ref 22–32)
Calcium: 8 mg/dL — ABNORMAL LOW (ref 8.9–10.3)
Chloride: 96 mmol/L — ABNORMAL LOW (ref 98–111)
Creatinine, Ser: 5.07 mg/dL — ABNORMAL HIGH (ref 0.61–1.24)
GFR, Estimated: 12 mL/min — ABNORMAL LOW (ref >60)
Glucose, Bld: 97 mg/dL (ref 70–99)
Phosphorus: 3 mg/dL (ref 2.5–4.6)
Potassium: 3.3 mmol/L — ABNORMAL LOW (ref 3.5–5.1)
Sodium: 132 mmol/L — ABNORMAL LOW (ref 135–145)

## 2023-02-26 LAB — CBC
HCT: 19.6 % — ABNORMAL LOW (ref 39.0–52.0)
Hemoglobin: 6.1 g/dL — CL (ref 13.0–17.0)
MCH: 30.3 pg (ref 26.0–34.0)
MCHC: 31.1 g/dL (ref 30.0–36.0)
MCV: 97.5 fL (ref 80.0–100.0)
Platelets: 136 10*3/uL — ABNORMAL LOW (ref 150–400)
RBC: 2.01 MIL/uL — ABNORMAL LOW (ref 4.22–5.81)
RDW: 22.9 % — ABNORMAL HIGH (ref 11.5–15.5)
WBC: 8.7 10*3/uL (ref 4.0–10.5)
nRBC: 0 % (ref 0.0–0.2)

## 2023-02-26 LAB — GLUCOSE, CAPILLARY
Glucose-Capillary: 107 mg/dL — ABNORMAL HIGH (ref 70–99)
Glucose-Capillary: 101 mg/dL — ABNORMAL HIGH (ref 70–99)
Glucose-Capillary: 89 mg/dL (ref 70–99)
Glucose-Capillary: 104 mg/dL — ABNORMAL HIGH (ref 70–99)
Glucose-Capillary: 114 mg/dL — ABNORMAL HIGH (ref 70–99)

## 2023-02-26 LAB — CBC WITH DIFFERENTIAL/PLATELET
Abs Immature Granulocytes: 0.3 10*3/uL — ABNORMAL HIGH (ref 0.00–0.07)
Basophils Absolute: 0 10*3/uL (ref 0.0–0.1)
Basophils Relative: 0 %
Eosinophils Absolute: 0 10*3/uL (ref 0.0–0.5)
Eosinophils Relative: 0 %
HCT: 20.7 % — ABNORMAL LOW (ref 39.0–52.0)
Hemoglobin: 6.5 g/dL — CL (ref 13.0–17.0)
Immature Granulocytes: 3 %
Lymphocytes Relative: 17 %
Lymphs Abs: 1.9 10*3/uL (ref 0.7–4.0)
MCH: 31.4 pg (ref 26.0–34.0)
MCHC: 31.4 g/dL (ref 30.0–36.0)
MCV: 100 fL (ref 80.0–100.0)
Monocytes Absolute: 5.2 10*3/uL — ABNORMAL HIGH (ref 0.1–1.0)
Monocytes Relative: 49 %
Neutro Abs: 3.4 10*3/uL (ref 1.7–7.7)
Neutrophils Relative %: 31 %
Platelets: 138 10*3/uL — ABNORMAL LOW (ref 150–400)
RBC: 2.07 MIL/uL — ABNORMAL LOW (ref 4.22–5.81)
RDW: 23 % — ABNORMAL HIGH (ref 11.5–15.5)
WBC: 10.8 10*3/uL — ABNORMAL HIGH (ref 4.0–10.5)
nRBC: 0 % (ref 0.0–0.2)

## 2023-02-26 LAB — HEMOGLOBIN AND HEMATOCRIT, BLOOD
HCT: 23.6 % — ABNORMAL LOW (ref 39.0–52.0)
Hemoglobin: 7.5 g/dL — ABNORMAL LOW (ref 13.0–17.0)

## 2023-02-26 LAB — COMPREHENSIVE METABOLIC PANEL
ALT: <5 U/L (ref 0–44)
AST: 14 U/L — ABNORMAL LOW (ref 15–41)
Albumin: 1.5 g/dL — ABNORMAL LOW (ref 3.5–5.0)
Alkaline Phosphatase: 102 U/L (ref 38–126)
Anion gap: 11 (ref 5–15)
BUN: 18 mg/dL (ref 8–23)
CO2: 26 mmol/L (ref 22–32)
Calcium: 8 mg/dL — ABNORMAL LOW (ref 8.9–10.3)
Chloride: 96 mmol/L — ABNORMAL LOW (ref 98–111)
Creatinine, Ser: 5.13 mg/dL — ABNORMAL HIGH (ref 0.61–1.24)
GFR, Estimated: 12 mL/min — ABNORMAL LOW (ref >60)
Glucose, Bld: 99 mg/dL (ref 70–99)
Potassium: 3.4 mmol/L — ABNORMAL LOW (ref 3.5–5.1)
Sodium: 133 mmol/L — ABNORMAL LOW (ref 135–145)
Total Bilirubin: 0.8 mg/dL (ref 0.3–1.2)
Total Protein: 6.7 g/dL (ref 6.5–8.1)

## 2023-02-26 LAB — TYPE AND SCREEN
ABO/RH(D): B POS
Antibody Screen: NEGATIVE
Unit division: 0

## 2023-02-26 LAB — HEMOGLOBIN A1C
Hgb A1c MFr Bld: 6.2 % — ABNORMAL HIGH (ref 4.8–5.6)
Mean Plasma Glucose: 131.24 mg/dL

## 2023-02-26 LAB — BASIC METABOLIC PANEL
Anion gap: 14 (ref 5–15)
BUN: 17 mg/dL (ref 8–23)
CO2: 25 mmol/L (ref 22–32)
Calcium: 8.4 mg/dL — ABNORMAL LOW (ref 8.9–10.3)
Chloride: 93 mmol/L — ABNORMAL LOW (ref 98–111)
Creatinine, Ser: 5.15 mg/dL — ABNORMAL HIGH (ref 0.61–1.24)
GFR, Estimated: 12 mL/min — ABNORMAL LOW (ref >60)
Glucose, Bld: 112 mg/dL — ABNORMAL HIGH (ref 70–99)
Potassium: 3.6 mmol/L (ref 3.5–5.1)
Sodium: 132 mmol/L — ABNORMAL LOW (ref 135–145)

## 2023-02-26 LAB — PREPARE RBC (CROSSMATCH)

## 2023-02-26 LAB — HEPATITIS B SURFACE ANTIGEN: Hepatitis B Surface Ag: NONREACTIVE

## 2023-02-26 LAB — BPAM RBC: Blood Product Expiration Date: 202407102359

## 2023-02-26 MED ORDER — ALBUTEROL SULFATE (2.5 MG/3ML) 0.083% IN NEBU: 2.5000 mg | INHALATION_SOLUTION | RESPIRATORY_TRACT | Status: DC | PRN

## 2023-02-26 MED ORDER — SODIUM CHLORIDE 0.9% IV SOLUTION: Freq: Once | INTRAVENOUS | Status: DC

## 2023-02-26 MED ORDER — ANTICOAGULANT SODIUM CITRATE 4% (200MG/5ML) IV SOLN: 5.0000 mL | Status: DC | PRN

## 2023-02-26 MED ORDER — LIDOCAINE-PRILOCAINE 2.5-2.5 % EX CREA: 1.0000 | TOPICAL_CREAM | CUTANEOUS | Status: DC | PRN

## 2023-02-26 MED ORDER — ONDANSETRON HCL 4 MG/2ML IJ SOLN: 4.0000 mg | Freq: Four times a day (QID) | INTRAMUSCULAR | Status: DC | PRN

## 2023-02-26 MED ORDER — LIDOCAINE HCL (PF) 1 % IJ SOLN: 5.0000 mL | INTRAMUSCULAR | Status: DC | PRN

## 2023-02-26 MED ORDER — ONDANSETRON HCL 4 MG PO TABS: 4.0000 mg | ORAL_TABLET | Freq: Four times a day (QID) | ORAL | Status: DC | PRN

## 2023-02-26 MED ORDER — RIFAMPIN 300 MG PO CAPS
300.0000 mg | ORAL_CAPSULE | Freq: Three times a day (TID) | ORAL | Status: DC
  Administered 2023-02-26 – 2023-02-28 (×6): 300 mg via ORAL
  Filled 2023-02-26 (×9): qty 1

## 2023-02-26 MED ORDER — HEPARIN SODIUM (PORCINE) 1000 UNIT/ML IJ SOLN
INTRAMUSCULAR | Status: AC
  Filled 2023-02-26: qty 4

## 2023-02-26 MED ORDER — INSULIN ASPART 100 UNIT/ML IJ SOLN: 0.0000 [IU] | Freq: Three times a day (TID) | INTRAMUSCULAR | Status: DC

## 2023-02-26 MED ORDER — PENTAFLUOROPROP-TETRAFLUOROETH EX AERO: 1.0000 | INHALATION_SPRAY | CUTANEOUS | Status: DC | PRN

## 2023-02-26 MED ORDER — CEFAZOLIN IN SODIUM CHLORIDE 3-0.9 GM/100ML-% IV SOLN
3.0000 g | Freq: Once | INTRAVENOUS | Status: AC
  Administered 2023-02-26: 3 g via INTRAVENOUS
  Filled 2023-02-26: qty 100

## 2023-02-26 MED ORDER — HEPARIN SODIUM (PORCINE) 1000 UNIT/ML DIALYSIS: 1000.0000 [IU] | INTRAMUSCULAR | Status: DC | PRN

## 2023-02-26 MED ORDER — ALTEPLASE 2 MG IJ SOLR: 2.0000 mg | Freq: Once | INTRAMUSCULAR | Status: DC | PRN

## 2023-02-26 MED ORDER — CHLORHEXIDINE GLUCONATE CLOTH 2 % EX PADS
6.0000 | MEDICATED_PAD | Freq: Every day | CUTANEOUS | Status: DC
  Administered 2023-02-26 – 2023-02-28 (×3): 6 via TOPICAL

## 2023-02-26 NOTE — ED Notes (Signed)
ED TO INPATIENT HANDOFF REPORT  ED Nurse Name and Phone #: Morrie Sheldon [phone]  S Name/Age/Gender [name] 62 y.o. male Room/Bed: 020C/020C  Code Status   Code Status: Full Code  Home/SNF/Other Home Patient oriented to: self, place, time, and situation Is this baseline? Yes      Chief Complaint Anemia [D64.9]  Triage Note Pt BIB [facility] from [facility] d/t nose bleed - pt "had post nasal bleeding with little from nares" .  Bleeding ceased there but they wanted him "checked out".  Pt is not on blood thinners per EMS.   Pt has Dialysis at 6:30 am   Allergies Allergies  Allergen Reactions   Other Anaphylaxis    mushrooms   Plavix [Clopidogrel Bisulfate] Other (See Comments)    TTP Not listed on the [facility]   Fleet Enema [Enema]     Unknown reaction   Lovenox [Enoxaparin]     Unknown reaction   Morphine     Unknown reaction   Nsaids     Unknown reaction   Hydrocodone-Acetaminophen Itching    Level of Care/Admitting Diagnosis ED Disposition     ED Disposition  Admit   Condition  --   Comment  Hospital Area: [facility] [[id number]]  Level of Care: Telemetry Medical [[id number]]  May admit patient to [facility] or [facility] if equivalent level of care is available:: No  Covid Evaluation: Asymptomatic - no recent exposure (last 10 days) testing not required  Diagnosis: Anemia [829562]  Admitting Physician: Lurline Del [[id number]]  Attending Physician: Lurline Del [[id number]]  Certification:: I certify this patient will need inpatient services for at least 2 midnights  Estimated Length of Stay: 3          B Medical/Surgery History Past Medical History:  Diagnosis Date   Allergy    Anemia    Anxiety    Baker's cyst of knee    Blood transfusion without reported diagnosis    as baby    Coronary artery disease    quadruple bypass - March 2016   GERD (gastroesophageal reflux disease)    Gouty arthritis    "real bad" (01/17/2013)    Heart murmur    Hypercholesteremia    Hypertension    Myocardial infarction (HCC) 2017   Stroke (HCC)    Type II diabetes mellitus (HCC)    Past Surgical History:  Procedure Laterality Date   AORTIC VALVE REPLACEMENT N/A 11/25/2015   Procedure: AORTIC VALVE REPLACEMENT (AVR) USING A MAGNA EASE ;  Surgeon: Alleen Borne, MD;  [facility] OR;  Service: Open Heart Surgery;  Laterality: N/A;   CARDIAC CATHETERIZATION N/A 11/19/2015   Procedure: Right/Left Heart Cath and Coronary Angiography;  Surgeon: Lyn Records, MD;  Location: [facility] INVASIVE CV LAB;  Service: Cardiovascular;  Laterality: N/A;   COLONOSCOPY  2004   CORONARY ARTERY BYPASS GRAFT N/A 11/25/2015   Procedure: CORONARY ARTERY BYPASS GRAFTING (CABG)x4 LIMA-LAD; SVG-OM; SVG-RCA; SVG-DIAG;  Surgeon: Alleen Borne, MD;  Location: [facility] OR;  Service: Open Heart Surgery;  Laterality: N/A;   CYST REMOVAL TRUNK N/A 08/24/2016   Procedure: EXCISION OF BACK ABSCESS;  Surgeon: Jimmye Norman, MD;  Location: [facility];  Service: General;  Laterality: N/A;   FINGER AMPUTATION Right 1980's   3rd & 4th digits "got them mashed" (01/17/2013)   KNEE ARTHROSCOPY Right 1980's   MULTIPLE EXTRACTIONS WITH ALVEOLOPLASTY Bilateral 11/21/2015   Procedure: Extraction of tooth #'s 2,12 with alveoloplasty  and gross debridement of remaining dentition;  Surgeon: Charlynne Pander, DDS;  Location: [facility] OR;  Service: Oral Surgery;  Laterality: Bilateral;   TEE WITHOUT CARDIOVERSION N/A 11/20/2015   Procedure: TRANSESOPHAGEAL ECHOCARDIOGRAM (TEE);  Surgeon: Laurey Morale, MD;  Location: [facility] ENDOSCOPY;  Service: Cardiovascular;  Laterality: N/A;   TEE WITHOUT CARDIOVERSION N/A 11/25/2015   Procedure: TRANSESOPHAGEAL ECHOCARDIOGRAM (TEE);  Surgeon: Alleen Borne, MD;  Location: [facility] OR;  Service: Open Heart Surgery;  Laterality: N/A;     A IV Location/Drains/Wounds Patient Lines/Drains/Airways Status     Active Line/Drains/Airways     Name Placement  date Placement time Site Days   Peripheral IV 02/26/23 20 G Anterior;Distal;Right;Upper Arm 02/26/23  0050  Arm  less than 1   Hemodialysis Catheter Left Internal jugular 01/29/23  0550  Internal jugular  28   Airway 7.5 mm 01/29/23  0305  -- 28   Intraosseous Line 01/29/23 01/29/23  0300  --  28            Intake/Output Last 24 hours No intake or output data in the 24 hours ending 02/26/23 0517  Labs/Imaging Results for orders placed or performed during the hospital encounter of 02/25/23 (from the past 48 hour(s))  Basic metabolic panel     Status: Abnormal   Collection Time: 02/25/23 11:45 PM  Result Value Ref Range   Sodium 132 (L) 135 - 145 mmol/L   Potassium 3.6 3.5 - 5.1 mmol/L   Chloride 93 (L) 98 - 111 mmol/L   CO2 25 22 - 32 mmol/L   Glucose, Bld 112 (H) 70 - 99 mg/dL    Comment: Glucose reference range applies only to samples taken after fasting for at least 8 hours.   BUN 17 8 - 23 mg/dL   Creatinine, Ser 4.09 (H) 0.61 - 1.24 mg/dL   Calcium 8.4 (L) 8.9 - 10.3 mg/dL   GFR, Estimated 12 (L) >60 mL/min    Comment: (NOTE) Calculated using the CKD-EPI Creatinine Equation (2021)    Anion gap 14 5 - 15    Comment: Performed at [facility], [address], [place], [place] [address]  CBC with Differential     Status: Abnormal   Collection Time: 02/25/23 11:45 PM  Result Value Ref Range   WBC 10.8 (H) 4.0 - 10.5 K/uL   RBC 2.07 (L) 4.22 - 5.81 MIL/uL   Hemoglobin 6.5 (LL) 13.0 - 17.0 g/dL    Comment: REPEATED TO VERIFY THIS CRITICAL RESULT HAS VERIFIED AND BEEN CALLED TO Chancelor Hardrick RN BY SHERRY GALLOWAY ON 06 22 2024 AT 0023, AND HAS BEEN READ BACK.     HCT 20.7 (L) 39.0 - 52.0 %   MCV 100.0 80.0 - 100.0 fL   MCH 31.4 26.0 - 34.0 pg   MCHC 31.4 30.0 - 36.0 g/dL   RDW 47.8 (H) 29.5 - 62.1 %   Platelets 138 (L) 150 - 400 K/uL    Comment: REPEATED TO VERIFY   nRBC 0.0 0.0 - 0.2 %   Neutrophils Relative % 31 %   Neutro Abs 3.4 1.7 - 7.7 K/uL    Lymphocytes Relative 17 %   Lymphs Abs 1.9 0.7 - 4.0 K/uL   Monocytes Relative 49 %   Monocytes Absolute 5.2 (H) 0.1 - 1.0 K/uL   Eosinophils Relative 0 %   Eosinophils Absolute 0.0 0.0 - 0.5 K/uL   Basophils Relative 0 %   Basophils Absolute 0.0 0.0 - 0.1 K/uL  Immature Granulocytes 3 %   Abs Immature Granulocytes 0.30 (H) 0.00 - 0.07 K/uL    Comment: Performed at [facility], [address], [place], [place] [address]  Type and screen [facility]     Status: None   Collection Time: 02/26/23  2:20 AM  Result Value Ref Range   ABO/RH(D) B POS    Antibody Screen NEG    Sample Expiration      03/01/2023,2359 Performed at [facility], [address] [place], [place] [address]   Hepatitis B surface antigen     Status: None   Collection Time: 02/26/23  2:20 AM  Result Value Ref Range   Hepatitis B Surface Ag NON REACTIVE NON REACTIVE    Comment: Performed at [facility], [address], [place], [place] [address]   No results found.  Pending Labs [employer] (From admission, onward)     Start     Ordered   02/26/23 0106  Hepatitis B surface antibody,quantitative  (New Admission Hemo Labs (Hepatitis B))  Once,   URGENT        02/26/23 0107   Signed and Held  Renal function panel  Once,   R        Signed and Held   Signed and Held  CBC  Once,   R        Signed and Held   Signed and Held  Hemoglobin A1c  (Glycemic Control (SSI)  Q 4 Hours / Glycemic Control (SSI)  AC +/- HS)  Once,   R       Comments: To assess prior glycemic control    Signed and Held   Signed and Held  CBC  Tomorrow morning,   R        Signed and Held   Signed and Held  Comprehensive metabolic panel  Tomorrow morning,   R        Signed and Held            Vitals/Pain Today's Vitals   02/25/23 2326 02/25/23 2328 02/26/23 0222 02/26/23 0227  BP:  99/75  99/79  Pulse:  77  77  Resp:  18  19  Temp:  98.7 F (37.1 C)  98.4 F (36.9 C)  TempSrc:  Oral     SpO2:  95%  99%  Weight: 96.6 kg     Height: 5\' 6"  (1.676 m)     PainSc: 0-No pain  0-No pain     Isolation Precautions No active isolations  Medications Medications  Chlorhexidine Gluconate Cloth 2 % PADS 6 each (has no administration in time range)    Mobility non-ambulatory     Focused Assessments See chart   R Recommendations: See Admitting Provider Note  Report given to:   Additional Notes: see chart

## 2023-02-26 NOTE — Procedures (Signed)
HD Note:  Some information was entered later than the data was gathered due to patient care needs. The stated time with the data is accurate.  Received patient in bed to unit.  Alert and oriented.  Informed consent signed and in chart.   TX duration:3.5  Blood transfusion began at 0923.  Patient tolerated well.  Blood administration completed at 1007,  Transported back to the room  Alert, without acute distress.  Hand-off given to patient's nurse.   Access used: Upper Left chest HD catheter Access issues: None  Total UF removed: 1500 ml, 2000 ml UF - 500 ml blood administered   [facility]

## 2023-02-26 NOTE — Consult Note (Signed)
[facility] Renal Consultation Note    Indication for Consultation:  Management of ESRD/hemodialysis; anemia, hypertension/volume and secondary hyperparathyroidism  ONG:EXBMWUX, Binnie Rail, MD  HPI: [name] is a 62 y.o. male with dialysis dependent AKI 2/2 suspected ATN in setting of cardiac arrest/acute illness/septic shock further complicated by multiple contrast studies.  Past medical history significant for T2DM, HTN, prior L thalamic stroke 2017, CAD s/p CABG, s/p AVR w/ bioprosthetic valve, A fib not on AC due to recent dx septic embolic with associated cerebellar CVA.   Patient with complicated recent hospital admission:   [facility] 5/24-5/25 for MSSA bacteremia, AKI on CKD started on CRRT, TTP thought to be 2/2 Plavix with recommendation for plasmapheresis, and PEA arrest.  Patient transferred to [facility].   [facility] 5/25 - 02/22/23 MSSA bacteremia w/tricuspid valve endocarditis with recommendation for ancef and rifampin for 6 weeks (end date 03/14/23) per ID.  ARF on dialysis. TTP ruled out and platelets normalized.  On 6/1 CT showed new R cerebellar CVA w/MRI showing multiple b/l infarcts suggesting central septic embolic process. Further complicated by choledocholithiasis and likely gallstone pancreatitis s/p ERCP w/biliary sphincterotomy, balloon sweeps w/2 stones removed and plastic stent placed on 6/3. Perm cath placed on 02/16/23.    Patient presents to the ED from [facility] due to nose bleed.  Per patient there is no AC in his room at the rehab.  He became very hot and had a nose bleed.  He laid down and felt the blood go down his throat and attempted to swallow it but immediately vomited it back up.  Reports he does not typically have nose bleeds.  Denies any other types of bleeding.  Denies abdominal pain, melena, hematochezia, hematuria and coffee ground emesis.  States he has not made urine "in a while".  Has been tolerating dialysis well.  States he was having chest pain d/t CPR  during previous hospitalization but it has slowly improved.  Denies current CP, SOB, n/v/d, weakness, dizziness and fatigue.  Admits to LE edema and soreness in b/l LE that has been present for several days.    Pertinent findings during workup include Hgb 6.5, K 3.6, SCr 5.15, BUN 17.  Patient admitted for further evaluation and management.      Past Medical History:  Diagnosis Date   Allergy    Anemia    Anxiety    Baker's cyst of knee    Blood transfusion without reported diagnosis    as baby    Coronary artery disease    quadruple bypass - March 2016   GERD (gastroesophageal reflux disease)    Gouty arthritis    "real bad" (01/17/2013)   Heart murmur    Hypercholesteremia    Hypertension    Myocardial infarction (HCC) 2017   Stroke (HCC)    Type II diabetes mellitus (HCC)    Past Surgical History:  Procedure Laterality Date   AORTIC VALVE REPLACEMENT N/A 11/25/2015   Procedure: AORTIC VALVE REPLACEMENT (AVR) USING A MAGNA EASE ;  Surgeon: Alleen Borne, MD;  Location: [facility] OR;  Service: Open Heart Surgery;  Laterality: N/A;   CARDIAC CATHETERIZATION N/A 11/19/2015   Procedure: Right/Left Heart Cath and Coronary Angiography;  Surgeon: Lyn Records, MD;  Location: [facility] INVASIVE CV LAB;  Service: Cardiovascular;  Laterality: N/A;   COLONOSCOPY  2004   CORONARY ARTERY BYPASS GRAFT N/A 11/25/2015   Procedure: CORONARY ARTERY BYPASS GRAFTING (CABG)x4 LIMA-LAD; SVG-OM; SVG-RCA; SVG-DIAG;  Surgeon: Alleen Borne,  MD;  Location: [facility] OR;  Service: Open Heart Surgery;  Laterality: N/A;   CYST REMOVAL TRUNK N/A 08/24/2016   Procedure: EXCISION OF BACK ABSCESS;  Surgeon: Jimmye Norman, MD;  Location: [facility];  Service: General;  Laterality: N/A;   FINGER AMPUTATION Right 1980's   3rd & 4th digits "got them mashed" (01/17/2013)   KNEE ARTHROSCOPY Right 1980's   MULTIPLE EXTRACTIONS WITH ALVEOLOPLASTY Bilateral 11/21/2015   Procedure: Extraction of tooth #'s 2,12 with  alveoloplasty and gross debridement of remaining dentition;  Surgeon: Charlynne Pander, DDS;  Location: [facility] OR;  Service: Oral Surgery;  Laterality: Bilateral;   TEE WITHOUT CARDIOVERSION N/A 11/20/2015   Procedure: TRANSESOPHAGEAL ECHOCARDIOGRAM (TEE);  Surgeon: Laurey Morale, MD;  Location: [facility] ENDOSCOPY;  Service: Cardiovascular;  Laterality: N/A;   TEE WITHOUT CARDIOVERSION N/A 11/25/2015   Procedure: TRANSESOPHAGEAL ECHOCARDIOGRAM (TEE);  Surgeon: Alleen Borne, MD;  Location: [facility] OR;  Service: Open Heart Surgery;  Laterality: N/A;   Family History  Problem Relation Age of Onset   Diabetes Father    Hypertension Father    Hypertension Mother    Hypertension Sister    Hypertension Brother    Hypertension Brother    Hypertension Brother    Colon cancer Neg Hx    Esophageal cancer Neg Hx    Rectal cancer Neg Hx    Stomach cancer Neg Hx    Colon polyps Neg Hx    Social History:  reports that he has never smoked. He has never used smokeless tobacco. He reports that he does not currently use alcohol. He reports current drug use. Drug: Marijuana. Allergies  Allergen Reactions   Other Anaphylaxis    mushrooms   Plavix [Clopidogrel Bisulfate] Other (See Comments)    TTP Not listed on the [facility]   Fleet Enema [Enema]     Unknown reaction   Lovenox [Enoxaparin]     Unknown reaction   Morphine     Unknown reaction   Nsaids     Unknown reaction   Hydrocodone-Acetaminophen Itching   Prior to Admission medications   Medication Sig Start Date End Date Taking? Authorizing Provider  acetaminophen (TYLENOL) 325 MG tablet Take 650 mg by mouth every 4 (four) hours as needed for mild pain.   Yes [provider]  acetaminophen (TYLENOL) 500 MG tablet Take 1,000 mg by mouth every 8 (eight) hours as needed for mild pain.   Yes [provider]  atorvastatin (LIPITOR) 80 MG tablet TAKE 1 TABLET BY MOUTH ONCE DAILY AT 6 IN THE EVENING Patient taking differently: Take 80 mg by  mouth every evening. 11/11/22  Yes Marcine Matar, MD  carvedilol (COREG) 6.25 MG tablet Take 1 tablet (6.25 mg total) by mouth 2 (two) times daily. 01/11/23  Yes Gaston Islam., NP  ezetimibe (ZETIA) 10 MG tablet Take 1 tablet (10 mg total) by mouth daily. 11/11/22  Yes Marcine Matar, MD  gabapentin (NEURONTIN) 100 MG capsule Take 100 mg by mouth at bedtime.   Yes [provider]  ipratropium-albuterol (DUONEB) 0.5-2.5 (3) MG/3ML SOLN Take 3 mLs by nebulization every 6 (six) hours as needed (for wheezing).   Yes [provider]  lidocaine (LIDOCAINE PAIN RELIEF) 4 % Place 1 patch onto the skin daily. Apply to right side of chest topically one time a day for rib pain, remove after 12 hours   Yes [provider]  neomycin-polymyxin b-dexamethasone (MAXITROL) 3.5-10000-0.1 OINT Place 1 Application into the  right eye in the morning and at bedtime. for 10 days   Yes [provider]  polyvinyl alcohol (ARTIFICIAL TEARS) 1.4 % ophthalmic solution Place 1 drop into both eyes every 6 (six) hours as needed for dry eyes.   Yes [provider]  rifampin (RIFADIN) 300 MG capsule Take 300 mg by mouth every 8 (eight) hours. for endocarditis until 03/14/23 1159   Yes [provider]  sodium bicarbonate 650 MG tablet Take 1,300 mg by mouth 3 (three) times daily.   Yes [provider]  allopurinol (ZYLOPRIM) 300 MG tablet Take 0.5 tablets (150 mg total) by mouth daily. 12/13/22   Marcine Matar, MD  amLODipine (NORVASC) 10 MG tablet Take 1 tablet by mouth once daily 02/01/23   Marcine Matar, MD  colchicine 0.6 MG tablet Take 1 tablet (0.6 mg total) by mouth 2 (two) times daily as needed. 12/16/22   Tarry Kos, MD  EQ ALLERGY RELIEF 10 MG tablet Take 1 tablet by mouth once daily 03/06/19   Marcine Matar, MD  FARXIGA 10 MG TABS tablet Take 10 mg by mouth daily. 02/04/22   [provider]  fluticasone (FLONASE) 50 MCG/ACT nasal spray  Use 2 spray(s) in each nostril once daily Patient taking differently: Place 2 sprays into both nostrils daily. 10/26/22   Marcine Matar, MD  furosemide (LASIX) 40 MG tablet 1 tablet p.o. q. Mondays, Wednesday and Fridays. Patient taking differently: Take 40 mg by mouth every Monday, Wednesday, and Friday. 12/13/22   Marcine Matar, MD  lisinopril (ZESTRIL) 40 MG tablet Take 1 tablet (40 mg total) by mouth daily. 08/12/22   Marcine Matar, MD  potassium chloride SA (KLOR-CON M) 20 MEQ tablet TAKE 1 TABLET BY MOUTH ON MONDAY, Wed, FRIDAY and Saturdays. Patient taking differently: Take 20 mEq by mouth every Monday, Wednesday, and Friday. 12/13/22   Marcine Matar, MD  predniSONE (DELTASONE) 20 MG tablet Take 20 mg by mouth as needed.    [provider]   Current Facility-Administered Medications  Medication Dose Route Frequency Provider Last Rate Last Admin   0.9 %  sodium chloride infusion (Manually program via Guardrails IV Fluids)   Intravenous Once Bufford Buttner, MD       albuterol (PROVENTIL) (2.5 MG/3ML) 0.083% nebulizer solution 2.5 mg  2.5 mg Nebulization Q2H PRN Lurline Del, MD       alteplase (CATHFLO ACTIVASE) injection 2 mg  2 mg Intracatheter Once PRN Bufford Buttner, MD       anticoagulant sodium citrate solution 5 mL  5 mL Intracatheter PRN Bufford Buttner, MD       Chlorhexidine Gluconate Cloth 2 % PADS 6 each  6 each Topical Q0600 Bufford Buttner, MD       heparin injection 1,000 Units  1,000 Units Intracatheter PRN Bufford Buttner, MD       insulin aspart (novoLOG) injection 0-6 Units  0-6 Units Subcutaneous TID WC Skip Mayer A, MD       lidocaine (PF) (XYLOCAINE) 1 % injection 5 mL  5 mL Intradermal PRN Bufford Buttner, MD       lidocaine-prilocaine (EMLA) cream 1 Application  1 Application Topical PRN Bufford Buttner, MD       ondansetron [facility]) tablet 4 mg  4 mg Oral Q6H PRN Lurline Del, MD       Or   ondansetron [facility])  injection 4 mg  4 mg Intravenous Q6H PRN Lurline Del, MD  pentafluoroprop-tetrafluoroeth (GEBAUERS) aerosol 1 Application  1 Application Topical PRN Bufford Buttner, MD       rifampin (RIFADIN) capsule 300 mg  300 mg Oral Q8H Lurline Del, MD       Labs: Basic Metabolic Panel: Recent Labs  Lab 02/25/23 2345  NA 132*  K 3.6  CL 93*  CO2 25  GLUCOSE 112*  BUN 17  CREATININE 5.15*  CALCIUM 8.4*   CBC: Recent Labs  Lab 02/25/23 2345  WBC 10.8*  NEUTROABS 3.4  HGB 6.5*  HCT 20.7*  MCV 100.0  PLT 138*    CBG: Recent Labs  Lab 02/26/23 0613 02/26/23 0714  GLUCAP 107* 101*    Studies/Results: No results found.  ROS: All others negative except those listed in HPI.  Physical Exam: Vitals:   02/25/23 2326 02/25/23 2328 02/26/23 0227 02/26/23 0610  BP:  99/75 99/79 104/79  Pulse:  77 77 95  Resp:  18 19 20   Temp:  98.7 F (37.1 C) 98.4 F (36.9 C) 98.6 F (37 C)  TempSrc:  Oral  Oral  SpO2:  95% 99% 95%  Weight: 96.6 kg     Height: 5\' 6"  (1.676 m)        General: WDWN male in NAD Head: NCAT sclera not icteric MMM Neck: Supple. No lymphadenopathy Lungs: +rhonchi Heart: RRR. No murmur, rubs or gallops.  Abdomen: soft, nontender, +BS, no guarding, no rebound tenderness.  Lower extremities:2+ edema, +tenderness to palpation Neuro: AAOx3. Moves all extremities spontaneously. Psych:  Responds to questions appropriately with a normal affect. Dialysis Access: [facility]  Dialysis Orders:  TTS - [place]  3.5hrs, BFR 400, DFR 600,  EDW 94kg, 3K/ 2.5Ca  Access: TDC  Heparin none   Assessment/Plan:  Acute on chronic anemia of CKD - ABLA from nose bleed? Last Hgb 7.6 on d/c from [facility]. Tsat 28% with ferritin 1445 on labs at HD on 6/20.  No IV iron.  Start ESA AKI on CKD  - on dialysis. Suspected ATN in setting of cardiac arrest/acute illness/septic shock further complicated by multiple contrast studies.  Little to no UOP per patient.  Continue HD on TTS  schedule and monitor for renal recovery.  Renally dose medications.   Hypertension/volume  - BP low normal. Remains volume overloaded with LE edema.  Continue to challenge and lower dry weight as tolerated.   Secondary Hyperparathyroidism -  pth 32.  No indication for VDRA.  Calcium in goal.  Will check phos.   Nutrition - Renal diet w/fluid restrictions.  MSSA bacteremia - on Ancef qHD 2g Tuesday and Thursday and 3g on Saturday. and rifampin until 03/14/23.  Orders put in for   Virgina Norfolk, PA-C [facility] 02/26/2023, 8:05 AM

## 2023-02-26 NOTE — H&P (Signed)
History and Physical    [name] ZOX:[id number] DOB: 04/07/1961 DOA: 02/25/2023  PCP: Marcine Matar, MD  Patient coming from: [facility]  I have personally briefly reviewed patient's old medical records in [facility]  Chief Complaint: nose bleed   HPI: [name] is a 62 y.o. male with medical history significant of  T2DM, HTN, prior L thalamic stroke 2017, CAD s/p CABG, s/p AVR w/ bioprosthetic valve, CKD 3b (baseline 1.7-1.9)recent decline now ESRD MWF s/p last hospitalization, Afib not on AC due recent dx of septic embolic with associated Cerebellar CVA, who was admitted to [facility] 5/24-5/25/24 with diagnosis of MSSA bacteremia, Sepsis, TTP, PEA arrest. Patient presumed TTP base on finding on peripheral smear. TTP thought to be caused by plavix. Plans at that juncture made for transfer as no capability for plasmapheresis at [facility] during that period. OF note however prior to transfer to [facility], patient was started on CRRT. At [facility] patient  was able to be extubated and weaned of pressors. He was treated with Oxacillin and Rifampin for planned 6 week course (01/31/23 - 03/14/23) for MSSA bacteremia. Patient was also seen by hematology and with ADAMTS13 level of 81% TTP was ruled out. It appears patient presentation was mainly due to sepsis form MSSA bacteremia as platelets normalized. Patient course was complicated by  R cerebellar CVA with MRI noting multiple foci of infarct. 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 Patient course was further complicated by gallstone with early cholangitis pancreatitis for which patient had ERCP completed with biliary sphincterotomy, balloon sweeps with 2 stones removed, plastic stent placed. Further complications included  right superficial cephalic vein thrombus, acute dvt of the left internal jugular vein,  as well as left superficial cephalic vein. Patient presents to ED from NH due to nose  bleed. Of noted patient has stasis prior to arrival. Patient notes no current complaints , he denies any black stools, n/v/ abdominal pain, sob, or chest pain. He does noted inability to ambulate due to weakness from prolonged hospitalization.   ED Course:  Afeb,  bp 99/75, hr 77, rr 18sat 95%  EKG: afib rbbb Wbc: 10.8, hgb 6.5 ( 10.5) Plt 138 Na 132, K 3.6, cl 93, glu 112   Cr 5.159(3.89)  Patient is admitted for blood transfusion with HD and further observation given recent hospitalization with multiple complications.  Review of Systems: As per HPI otherwise 10 point review of systems negative.   Past Medical History:  Diagnosis Date   Allergy    Anemia    Anxiety    Baker's cyst of knee    Blood transfusion without reported diagnosis    as baby    Coronary artery disease    quadruple bypass - March 2016   GERD (gastroesophageal reflux disease)    Gouty arthritis    "real bad" (01/17/2013)   Heart murmur    Hypercholesteremia    Hypertension    Myocardial infarction (HCC) 2017   Stroke (HCC)    Type II diabetes mellitus (HCC)     Past Surgical History:  Procedure Laterality Date   AORTIC VALVE REPLACEMENT N/A 11/25/2015   Procedure: AORTIC VALVE REPLACEMENT (AVR) USING A MAGNA EASE ;  Surgeon: Alleen Borne, MD;  Location: [facility] OR;  Service: Open Heart Surgery;  Laterality: N/A;   CARDIAC CATHETERIZATION N/A 11/19/2015   Procedure: Right/Left Heart Cath and Coronary Angiography;  Surgeon: Lyn Records, MD;  Location: [facility] INVASIVE CV LAB;  Service: Cardiovascular;  Laterality: N/A;   COLONOSCOPY  2004   CORONARY ARTERY BYPASS GRAFT N/A 11/25/2015   Procedure: CORONARY ARTERY BYPASS GRAFTING (CABG)x4 LIMA-LAD; SVG-OM; SVG-RCA; SVG-DIAG;  Surgeon: Alleen Borne, MD;  Location: [facility] OR;  Service: Open Heart Surgery;  Laterality: N/A;   CYST REMOVAL TRUNK N/A 08/24/2016   Procedure: EXCISION OF BACK ABSCESS;  Surgeon: Jimmye Norman, MD;  Location: [facility];   Service: General;  Laterality: N/A;   FINGER AMPUTATION Right 1980's   3rd & 4th digits "got them mashed" (01/17/2013)   KNEE ARTHROSCOPY Right 1980's   MULTIPLE EXTRACTIONS WITH ALVEOLOPLASTY Bilateral 11/21/2015   Procedure: Extraction of tooth #'s 2,12 with alveoloplasty and gross debridement of remaining dentition;  Surgeon: Charlynne Pander, DDS;  Location: [facility] OR;  Service: Oral Surgery;  Laterality: Bilateral;   TEE WITHOUT CARDIOVERSION N/A 11/20/2015   Procedure: TRANSESOPHAGEAL ECHOCARDIOGRAM (TEE);  Surgeon: Laurey Morale, MD;  Location: [facility] ENDOSCOPY;  Service: Cardiovascular;  Laterality: N/A;   TEE WITHOUT CARDIOVERSION N/A 11/25/2015   Procedure: TRANSESOPHAGEAL ECHOCARDIOGRAM (TEE);  Surgeon: Alleen Borne, MD;  Location: [facility] OR;  Service: Open Heart Surgery;  Laterality: N/A;     reports that he has never smoked. He has never used smokeless tobacco. He reports that he does not currently use alcohol. He reports current drug use. Drug: Marijuana.  Allergies  Allergen Reactions   Other Anaphylaxis    mushrooms   Plavix [Clopidogrel Bisulfate] Other (See Comments)    TTP Not listed on the [facility]   Fleet Enema [Enema]     Unknown reaction   Lovenox [Enoxaparin]     Unknown reaction   Morphine     Unknown reaction   Nsaids     Unknown reaction   Hydrocodone-Acetaminophen Itching    Family History  Problem Relation Age of Onset   Diabetes Father    Hypertension Father    Hypertension Mother    Hypertension Sister    Hypertension Brother    Hypertension Brother    Hypertension Brother    Colon cancer Neg Hx    Esophageal cancer Neg Hx    Rectal cancer Neg Hx    Stomach cancer Neg Hx    Colon polyps Neg Hx     Prior to Admission medications   Medication Sig Start Date End Date Taking? Authorizing Provider  acetaminophen (TYLENOL) 325 MG tablet Take 650 mg by mouth every 4 (four) hours as needed for mild pain.   Yes [provider]  acetaminophen (TYLENOL)  500 MG tablet Take 1,000 mg by mouth every 8 (eight) hours as needed for mild pain.   Yes [provider]  atorvastatin (LIPITOR) 80 MG tablet TAKE 1 TABLET BY MOUTH ONCE DAILY AT 6 IN THE EVENING Patient taking differently: Take 80 mg by mouth every evening. 11/11/22  Yes Marcine Matar, MD  carvedilol (COREG) 6.25 MG tablet Take 1 tablet (6.25 mg total) by mouth 2 (two) times daily. 01/11/23  Yes Gaston Islam., NP  ezetimibe (ZETIA) 10 MG tablet Take 1 tablet (10 mg total) by mouth daily. 11/11/22  Yes Marcine Matar, MD  gabapentin (NEURONTIN) 100 MG capsule Take 100 mg by mouth at bedtime.   Yes [provider]  ipratropium-albuterol (DUONEB) 0.5-2.5 (3) MG/3ML SOLN Take 3 mLs by nebulization every 6 (six) hours as needed (for wheezing).   Yes [provider]  lidocaine (LIDOCAINE PAIN RELIEF) 4 %  Place 1 patch onto the skin daily. Apply to right side of chest topically one time a day for rib pain, remove after 12 hours   Yes [provider]  neomycin-polymyxin b-dexamethasone (MAXITROL) 3.5-10000-0.1 OINT Place 1 Application into the right eye in the morning and at bedtime. for 10 days   Yes [provider]  polyvinyl alcohol (ARTIFICIAL TEARS) 1.4 % ophthalmic solution Place 1 drop into both eyes every 6 (six) hours as needed for dry eyes.   Yes [provider]  rifampin (RIFADIN) 300 MG capsule Take 300 mg by mouth every 8 (eight) hours. for endocarditis until 03/14/23 1159   Yes [provider]  sodium bicarbonate 650 MG tablet Take 1,300 mg by mouth 3 (three) times daily.   Yes [provider]  allopurinol (ZYLOPRIM) 300 MG tablet Take 0.5 tablets (150 mg total) by mouth daily. 12/13/22   Marcine Matar, MD  amLODipine (NORVASC) 10 MG tablet Take 1 tablet by mouth once daily 02/01/23   Marcine Matar, MD  colchicine 0.6 MG tablet Take 1 tablet (0.6 mg total) by mouth 2 (two) times daily as needed. 12/16/22   Tarry Kos, MD  EQ ALLERGY RELIEF 10 MG tablet Take 1 tablet by mouth once daily 03/06/19   Marcine Matar, MD  FARXIGA 10 MG TABS tablet Take 10 mg by mouth daily. 02/04/22   [provider]  fluticasone (FLONASE) 50 MCG/ACT nasal spray Use 2 spray(s) in each nostril once daily Patient taking differently: Place 2 sprays into both nostrils daily. 10/26/22   Marcine Matar, MD  furosemide (LASIX) 40 MG tablet 1 tablet p.o. q. Mondays, Wednesday and Fridays. Patient taking differently: Take 40 mg by mouth every Monday, Wednesday, and Friday. 12/13/22   Marcine Matar, MD  lisinopril (ZESTRIL) 40 MG tablet Take 1 tablet (40 mg total) by mouth daily. 08/12/22   Marcine Matar, MD  potassium chloride SA (KLOR-CON M) 20 MEQ tablet TAKE 1 TABLET BY MOUTH ON MONDAY, Wed, FRIDAY and Saturdays. Patient taking differently: Take 20 mEq by mouth every Monday, Wednesday, and Friday. 12/13/22   Marcine Matar, MD  predniSONE (DELTASONE) 20 MG tablet Take 20 mg by mouth as needed.    [provider]  predniSONE (STERAPRED UNI-PAK 21 TAB) 10 MG (21) TBPK tablet Take as directed Patient not taking: Reported on 01/29/2023 12/16/22   Tarry Kos, MD    Physical Exam: Vitals:   02/25/23 2326 02/25/23 2328 02/26/23 0227  BP:  99/75 99/79  Pulse:  77 77  Resp:  18 19  Temp:  98.7 F (37.1 C) 98.4 F (36.9 C)  TempSrc:  Oral   SpO2:  95% 99%  Weight: 96.6 kg    Height: 5\' 6"  (1.676 m)      Constitutional: NAD, calm, comfortable Vitals:   02/25/23 2326 02/25/23 2328 02/26/23 0227  BP:  99/75 99/79  Pulse:  77 77  Resp:  18 19  Temp:  98.7 F (37.1 C) 98.4 F (36.9 C)  TempSrc:  Oral   SpO2:  95% 99%  Weight: 96.6 kg    Height: 5\' 6"  (1.676 m)     Eyes: PERRL, lids and conjunctivae normal ENMT: Mucous membranes are moist. Posterior pharynx clear of any exudate or lesions.Normal dentition.  Neck: normal, supple, no masses, no thyromegaly Respiratory: clear to  auscultation bilaterally, no wheezing, no crackles. Normal respiratory effort. No accessory muscle use.  Cardiovascular: Regular rate and  rhythm, no murmurs / rubs / gallops. +extremity edema. 2+ pedal pulses. Abdomen: no tenderness, no masses palpated. No hepatosplenomegaly. Bowel sounds positive.  Musculoskeletal: no clubbing / cyanosis. No joint deformity upper and lower extremities. Good ROM, no contractures. Normal muscle tone.  Skin: no rashes, lesions, ulcers. No induration Neurologic: CN 2-12 grossly intact. Sensation intact, MAE x 4 Psychiatric: Normal judgment and insight. Alert and oriented x 3. Normal mood.    Labs on Admission: I have personally reviewed following labs and imaging studies  CBC: Recent Labs  Lab 02/25/23 2345  WBC 10.8*  NEUTROABS 3.4  HGB 6.5*  HCT 20.7*  MCV 100.0  PLT 138*   Basic Metabolic Panel: Recent Labs  Lab 02/25/23 2345  NA 132*  K 3.6  CL 93*  CO2 25  GLUCOSE 112*  BUN 17  CREATININE 5.15*  CALCIUM 8.4*   GFR: Estimated Creatinine Clearance: 16 mL/min (A) (by C-G formula based on SCr of 5.15 mg/dL (H)). Liver Function Tests: No results for input(s): "AST", "ALT", "ALKPHOS", "BILITOT", "PROT", "ALBUMIN" in the last 168 hours. No results for input(s): "LIPASE", "AMYLASE" in the last 168 hours. No results for input(s): "AMMONIA" in the last 168 hours. Coagulation Profile: No results for input(s): "INR", "PROTIME" in the last 168 hours. Cardiac Enzymes: No results for input(s): "CKTOTAL", "CKMB", "CKMBINDEX", "TROPONINI" in the last 168 hours. BNP (last 3 results) No results for input(s): "PROBNP" in the last 8760 hours. HbA1C: No results for input(s): "HGBA1C" in the last 72 hours. CBG: No results for input(s): "GLUCAP" in the last 168 hours. Lipid Profile: No results for input(s): "CHOL", "HDL", "LDLCALC", "TRIG", "CHOLHDL", "LDLDIRECT" in the last 72 hours. Thyroid Function Tests: No results for input(s): "TSH", "T4TOTAL",  "FREET4", "T3FREE", "THYROIDAB" in the last 72 hours. Anemia Panel: No results for input(s): "VITAMINB12", "FOLATE", "FERRITIN", "TIBC", "IRON", "RETICCTPCT" in the last 72 hours. Urine analysis:    Component Value Date/Time   COLORURINE AMBER (A) 01/29/2023 0745   APPEARANCEUR TURBID (A) 01/29/2023 0745   LABSPEC 1.031 (H) 01/29/2023 0745   PHURINE 5.0 01/29/2023 0745   GLUCOSEU NEGATIVE 01/29/2023 0745   HGBUR MODERATE (A) 01/29/2023 0745   HGBUR negative 04/09/2010 0814   BILIRUBINUR SMALL (A) 01/29/2023 0745   KETONESUR NEGATIVE 01/29/2023 0745   PROTEINUR >=300 (A) 01/29/2023 0745   UROBILINOGEN 0.2 02/24/2015 1601   NITRITE NEGATIVE 01/29/2023 0745   LEUKOCYTESUR NEGATIVE 01/29/2023 0745    Radiological Exams on Admission: No results found.  EKG: Independently reviewed. See above  Assessment/Plan  Anemia multifactorial  -ACD and blood loss s/p ERCP( 6/4) -s/p 3 units PRBC on last hospitalization with hgb improving form 6.7 post ERCP to 8.3  -patient now presents to ED with hx of low volume nose bleed with hgb check  noting hgb of 6.7 -admit for observation per nephrology recommendation as patient has planned HD with blood transfusion for am    Hx of MSSA bacteremia  associated with Tricuspid valve Endocarditis  POA -complicated by PEA/septic embolic  Hx of Bioprosthetic Aortic Valve 2017  - IV cefazolin 1g daily (or 2g/2g/3g post HD) - rifampin 300 mg PO every 8 hours via DHT - Anticipate 6 weeks of oxacillin and rifampin from blood culture clearance (01/31/23 - 03/14/23)   CKDIIIb with progression to ESRD in setting of MSSA with severe septic shock -due to ATN in setting of above -HD MWF  Hx of Gallstone pancreatitis/ Cholangitis  -s/p ERCP completed with biliary sphincterotomy, balloon sweeps with 2 stones  removed, plastic stent placed.  -s/p abx x 7 days     Atrial fibrillation -CHADS VASc of 6  -not on AC due to risk of bleeding related to septic  embolic cva  Recent hx of  Dvt left internal jugular vien Right and left cephalic vein thrombus - no AC recommended due to hx of recent embolic cva    Thrombocytopenia  -returned to baseline  -TTP ruled out   DMII - Last A1c 5.8% on 08/12/22 - hold home farxiga - ACHS BG checks - Continued SSI  CAD s/p CABG - Neuro advising against AP/AC  HLD  restarted home atorvastatin 80 mg daily, ezetimibe 10 mg daily. Held initially while NPO  Gout  held home colchicine 0.6 mg BID, Used PRN at home  DVT prophylaxis: scd Code Status: full/ as discussed per patient wishes in event of cardiac arrest  Family Communication: none at bedside Disposition Plan patient  expected to be admitted greater than 2 midnights  Consults called: Nephrology Signe Colt Admission status: med tele  Lurline Del MD [facility]   If 7PM-7AM, please contact night-coverage [email or web address] Password [email or web address]  02/26/2023, 3:30 AM

## 2023-02-26 NOTE — Progress Notes (Signed)
New Admission Note:  Arrival Method:Stretcher Mental Orientation: Alert and oriented x 4 Telemetry: Box 09 Assessment: Completed Skin: Refuses to get checked IV: NSL Pain: Denies Tubes:  N/A Safety Measures: Safety Fall Prevention Plan initiated.  Admission: Completed 5 M  Orientation: Patient has been orientated to the room, unit and the staff. Welcome booklet given.  Family: N/A Orders have been reviewed and implemented. Will continue to monitor the patient. Call light has been placed within reach and bed alarm has been activated.   Guilford Shi BSN, RN  Phone Number: [phone]

## 2023-02-26 NOTE — Plan of Care (Signed)
  Problem: Clinical Measurements: Goal: Respiratory complications will improve Outcome: Progressing   Problem: Nutrition: Goal: Adequate nutrition will be maintained Outcome: Progressing   Problem: Elimination: Goal: Will not experience complications related to bowel motility Outcome: Progressing   

## 2023-02-26 NOTE — Procedures (Signed)
Patient seen and examined on Hemodialysis. The procedure was supervised and I have made appropriate changes. BP 109/89   Pulse 79   Temp 98.4 F (36.9 C) (Oral)   Resp (!) 27   Ht 5\' 6"  (1.676 m)   Wt 96.6 kg   SpO2 100%   BMI 34.37 kg/m   QB 400 mL/ min via TDC, UF goal 2.5L  Tolerating treatment without complaints at this time.  Will get 1 u pRBCS with HD and his antibiotics too.   Bufford Buttner MD [facility] Pgr [phone] 9:20 AM

## 2023-02-26 NOTE — Progress Notes (Signed)
Per HPI: [name] is a 62 y.o. male with medical history significant of  T2DM, HTN, prior L thalamic stroke 2017, CAD s/p CABG, s/p AVR w/ bioprosthetic valve, CKD 3b (baseline 1.7-1.9)recent decline now ESRD MWF s/p last hospitalization, Afib not on AC due recent dx of septic embolic with associated Cerebellar CVA, who was admitted to [facility] 5/24-5/25/24 with diagnosis of MSSA bacteremia, Sepsis, TTP, PEA arrest. Patient presumed TTP base on finding on peripheral smear. TTP thought to be caused by plavix. Plans at that juncture made for transfer as no capability for plasmapheresis at [facility] during that period. OF note however prior to transfer to [facility], patient was started on CRRT. At [facility] patient  was able to be extubated and weaned of pressors. He was treated with Oxacillin and Rifampin for planned 6 week course (01/31/23 - 03/14/23) for MSSA bacteremia. Patient was also seen by hematology and with ADAMTS13 level of 81% TTP was ruled out. It appears patient presentation was mainly due to sepsis form MSSA bacteremia as platelets normalized. Patient course was complicated by  R cerebellar CVA with MRI noting multiple foci of infarct. 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 Patient course was further complicated by gallstone with early cholangitis pancreatitis for which patient had ERCP completed with biliary sphincterotomy, balloon sweeps with 2 stones removed, plastic stent placed. Further complications included  right superficial cephalic vein thrombus, acute dvt of the left internal jugular vein,  as well as left superficial cephalic vein.  -Patient presented to ED from [facility] due to epistaxis.  He claims that it began because it was very hot in his room and his [facility] is not working.  He has been admitted for 1 unit PRBC transfusion with hemodialysis today.  He will require new authorization and PT/OT evaluation prior to return to facility and is  not safe for discharge given unsafe living condition with no AC as confirmed by TOC.  Continue to monitor a.m. labs.  He was seen and evaluated at hemodialysis and has been admitted after midnight.  No further epistaxis reported.  Continue Ancef with hemodialysis as ordered through 03/14/2023.  Total care time: 30 minutes.

## 2023-02-27 DIAGNOSIS — D649 Anemia, unspecified: Secondary | ICD-10-CM

## 2023-02-27 LAB — TYPE AND SCREEN

## 2023-02-27 LAB — MAGNESIUM: Magnesium: 1.7 mg/dL (ref 1.7–2.4)

## 2023-02-27 LAB — CBC
HCT: 22.3 % — ABNORMAL LOW (ref 39.0–52.0)
Hemoglobin: 7 g/dL — ABNORMAL LOW (ref 13.0–17.0)
MCH: 28.8 pg (ref 26.0–34.0)
MCHC: 31.4 g/dL (ref 30.0–36.0)
MCV: 91.8 fL (ref 80.0–100.0)
Platelets: 127 10*3/uL — ABNORMAL LOW (ref 150–400)
RBC: 2.43 MIL/uL — ABNORMAL LOW (ref 4.22–5.81)
RDW: 25.3 % — ABNORMAL HIGH (ref 11.5–15.5)
WBC: 11.2 10*3/uL — ABNORMAL HIGH (ref 4.0–10.5)
nRBC: 0.2 % (ref 0.0–0.2)

## 2023-02-27 LAB — COMPREHENSIVE METABOLIC PANEL
ALT: <5 U/L (ref 0–44)
AST: 12 U/L — ABNORMAL LOW (ref 15–41)
Albumin: 1.5 g/dL — ABNORMAL LOW (ref 3.5–5.0)
Alkaline Phosphatase: 98 U/L (ref 38–126)
Anion gap: 11 (ref 5–15)
BUN: 13 mg/dL (ref 8–23)
CO2: 26 mmol/L (ref 22–32)
Calcium: 8.1 mg/dL — ABNORMAL LOW (ref 8.9–10.3)
Chloride: 97 mmol/L — ABNORMAL LOW (ref 98–111)
Creatinine, Ser: 4.12 mg/dL — ABNORMAL HIGH (ref 0.61–1.24)
GFR, Estimated: 15 mL/min — ABNORMAL LOW (ref >60)
Glucose, Bld: 100 mg/dL — ABNORMAL HIGH (ref 70–99)
Potassium: 3.4 mmol/L — ABNORMAL LOW (ref 3.5–5.1)
Sodium: 134 mmol/L — ABNORMAL LOW (ref 135–145)
Total Bilirubin: 0.7 mg/dL (ref 0.3–1.2)
Total Protein: 6.9 g/dL (ref 6.5–8.1)

## 2023-02-27 LAB — BPAM RBC
ISSUE DATE / TIME: 202406220906
Unit Type and Rh: 7300

## 2023-02-27 LAB — GLUCOSE, CAPILLARY
Glucose-Capillary: 89 mg/dL (ref 70–99)
Glucose-Capillary: 124 mg/dL — ABNORMAL HIGH (ref 70–99)
Glucose-Capillary: 112 mg/dL — ABNORMAL HIGH (ref 70–99)
Glucose-Capillary: 104 mg/dL — ABNORMAL HIGH (ref 70–99)

## 2023-02-27 MED ORDER — CEFAZOLIN SODIUM-DEXTROSE 2-4 GM/100ML-% IV SOLN: 2.0000 g | INTRAVENOUS | Status: DC

## 2023-02-27 MED ORDER — LIDOCAINE 5 % EX PTCH
1.0000 | MEDICATED_PATCH | Freq: Every day | CUTANEOUS | Status: DC
  Administered 2023-02-27 – 2023-02-28 (×2): 1 via TRANSDERMAL
  Filled 2023-02-27 (×2): qty 1

## 2023-02-27 MED ORDER — CEFAZOLIN IN SODIUM CHLORIDE 3-0.9 GM/100ML-% IV SOLN: 3.0000 g | INTRAVENOUS | Status: DC

## 2023-02-27 MED ORDER — GABAPENTIN 100 MG PO CAPS
100.0000 mg | ORAL_CAPSULE | Freq: Every day | ORAL | Status: DC
  Administered 2023-02-27: 100 mg via ORAL
  Filled 2023-02-27: qty 1

## 2023-02-27 MED ORDER — ATORVASTATIN CALCIUM 80 MG PO TABS
80.0000 mg | ORAL_TABLET | Freq: Every day | ORAL | Status: DC
  Administered 2023-02-27: 80 mg via ORAL
  Filled 2023-02-27: qty 1

## 2023-02-27 MED ORDER — DARBEPOETIN ALFA 60 MCG/0.3ML IJ SOSY
60.0000 ug | PREFILLED_SYRINGE | Freq: Once | INTRAMUSCULAR | Status: AC
  Administered 2023-02-27: 60 ug via SUBCUTANEOUS
  Filled 2023-02-27: qty 0.3

## 2023-02-27 MED ORDER — EZETIMIBE 10 MG PO TABS
10.0000 mg | ORAL_TABLET | Freq: Every day | ORAL | Status: DC
  Administered 2023-02-27 – 2023-02-28 (×2): 10 mg via ORAL
  Filled 2023-02-27 (×2): qty 1

## 2023-02-27 MED ORDER — POLYVINYL ALCOHOL 1.4 % OP SOLN: 1.0000 [drp] | Freq: Four times a day (QID) | OPHTHALMIC | Status: DC | PRN

## 2023-02-27 MED ORDER — CAMPHOR-MENTHOL 0.5-0.5 % EX LOTN
TOPICAL_LOTION | CUTANEOUS | Status: DC | PRN
  Filled 2023-02-27: qty 222

## 2023-02-27 MED ORDER — NEOMYCIN-POLYMYXIN-DEXAMETH 3.5-10000-0.1 OP OINT
1.0000 | TOPICAL_OINTMENT | Freq: Three times a day (TID) | OPHTHALMIC | Status: DC
  Administered 2023-02-27 (×2): 1 via OPHTHALMIC
  Filled 2023-02-27: qty 3.5

## 2023-02-27 MED ORDER — CARVEDILOL 6.25 MG PO TABS
6.2500 mg | ORAL_TABLET | Freq: Two times a day (BID) | ORAL | Status: DC
  Administered 2023-02-27 – 2023-02-28 (×3): 6.25 mg via ORAL
  Filled 2023-02-27 (×3): qty 1

## 2023-02-27 MED ORDER — SODIUM BICARBONATE 650 MG PO TABS
1300.0000 mg | ORAL_TABLET | Freq: Three times a day (TID) | ORAL | Status: DC
  Administered 2023-02-27 – 2023-02-28 (×4): 1300 mg via ORAL
  Filled 2023-02-27 (×4): qty 2

## 2023-02-27 NOTE — Plan of Care (Signed)
  Problem: Clinical Measurements: Goal: Respiratory complications will improve Outcome: Progressing Goal: Cardiovascular complication will be avoided Outcome: Progressing   Problem: Nutrition: Goal: Adequate nutrition will be maintained Outcome: Progressing   Problem: Elimination: Goal: Will not experience complications related to bowel motility Outcome: Progressing   

## 2023-02-27 NOTE — Progress Notes (Signed)
PROGRESS NOTE  [name] ZOX:[id number] DOB: [birth year] DOA: 02/25/2023 PCP: Marcine Matar, MD   LOS: 1 day   Brief Narrative / Interim history: 62 year old male with history of DM2, HTN, prior left thalamic stroke 2017, CAD s/p CABG, s/p AVR w/ bioprosthetic valve, CKD 3b (baseline 1.7-1.9)recent decline now ESRD MWF s/p last hospitalization, Afib not on AC due recent dx of septic embolic with associated Cerebellar CVA, who was admitted to [facility] 5/24-5/25/24 with diagnosis of MSSA bacteremia, Sepsis, TTP, PEA arrest. Patient presumed TTP base on finding on peripheral smear. TTP thought to be caused by plavix. Plans at that juncture made for transfer as no capability for plasmapheresis at [facility] during that period. OF note however prior to transfer to [facility], patient was started on CRRT. At [facility] patient  was able to be extubated and weaned of pressors. He was treated with Oxacillin and Rifampin for planned 6 week course (01/31/23 - 03/14/23) for MSSA bacteremia. Patient was also seen by hematology and with ADAMTS13 level of 81% TTP was ruled out. It appears patient presentation was mainly due to sepsis form MSSA bacteremia as platelets normalized. Patient course was complicated by  R cerebellar CVA with MRI noting multiple foci of infarct. 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 Patient course was further complicated by gallstone with early cholangitis pancreatitis for which patient had ERCP completed with biliary sphincterotomy, balloon sweeps with 2 stones removed, plastic stent placed. Further complications included  right superficial cephalic vein thrombus, acute dvt of the left internal jugular vein,  as well as left superficial cephalic vein. Patient presents to ED from NH due to nose bleed   Subjective / 24h Interval events: Has no complaints this morning.  Underwent dialysis yesterday and received a unit of blood without issues.  Adamant  that he is not returning to the same SNF given lack of AC  Assesement and Plan: Principal problem Anemia, multifactorial -likely in the setting of renal disease as well as epistaxis.  Received a unit of packed red blood cells, hemoglobin improved and is now 7.0.  Continue to monitor  Active problems Hx of MSSA bacteremia / septic shock associated with Tricuspid valve Endocarditis (during prior hospitalization), history of bioprosthetic aortic valve in 2017 - complicated by PEA/septic emboli.  Continue 3 g Ancef with HD and rifampin 300 mg po Q8h, up until 03/14/2023  CKDIIIb with progression to ESRD in setting of prolonged recent hospitalization - due to ATN in setting of above, HD MWF.  Nephrology following   Hx of Gallstone pancreatitis/ Cholangitis -s/p ERCP completed with biliary sphincterotomy, balloon sweeps with 2 stones removed, plastic stent placed.   Atrial fibrillation -CHADS VASc of 6, not on AC due to risk of bleeding related to septic embolic cva   Recent hx of Dvt left internal jugular vien, Right and left cephalic vein thrombus - no AC recommended due to hx of recent embolic cva    Thrombocytopenia  -returned to baseline, TTP ruled out    DMII - Last A1c 5.8% on 08/12/22  CAD s/p CABG - Neuro advising against AP/AC per [facility] notes  HLD -continue home medications  Gout  - hold home colchicine 0.6 mg BID, Used PRN at home   Scheduled Meds:  sodium chloride   Intravenous Once   Chlorhexidine Gluconate Cloth  6 each Topical Q0600   darbepoetin (ARANESP) injection - NON-DIALYSIS  60 mcg Subcutaneous Once   insulin aspart  0-6 Units Subcutaneous TID WC   rifampin  300 mg Oral Q8H   Continuous Infusions: PRN Meds:.albuterol, camphor-menthol, ondansetron **OR** ondansetron (ZOFRAN) IV  Current Outpatient Medications  Medication Instructions   acetaminophen (TYLENOL) 650 mg, Oral, Every 4 hours PRN   acetaminophen (TYLENOL) 1,000 mg, Oral, Every 8 hours   amLODipine  (NORVASC) 10 mg, Oral, Daily   atorvastatin (LIPITOR) 80 MG tablet TAKE 1 TABLET BY MOUTH ONCE DAILY AT 6 IN THE EVENING   carvedilol (COREG) 6.25 mg, Oral, 2 times daily   colchicine 0.6 mg, Oral, 2 times daily PRN   ezetimibe (ZETIA) 10 mg, Oral, Daily   Farxiga 10 mg, Daily   fluticasone (FLONASE) 50 MCG/ACT nasal spray Use 2 spray(s) in each nostril once daily   furosemide (LASIX) 40 MG tablet 1 tablet p.o. q. Mondays, Wednesday and Fridays.   gabapentin (NEURONTIN) 100 mg, Oral, Daily at bedtime   ipratropium-albuterol (DUONEB) 0.5-2.5 (3) MG/3ML SOLN 3 mLs, Nebulization, Every 6 hours PRN   lidocaine (LIDOCAINE PAIN RELIEF) 4 % 1 patch, Transdermal, Daily   lisinopril (ZESTRIL) 40 mg, Oral, Daily   neomycin-polymyxin b-dexamethasone (MAXITROL) 3.5-10000-0.1 OINT 1 Application, Right Eye, 2 times daily, for 10 days starting 0618/2024   polyvinyl alcohol (ARTIFICIAL TEARS) 1.4 % ophthalmic solution 1 drop, Both Eyes, Every 6 hours PRN   potassium chloride SA (KLOR-CON M) 20 MEQ tablet TAKE 1 TABLET BY MOUTH ON MONDAY, Wed, FRIDAY and Saturdays.   rifampin (RIFADIN) 300 mg, Oral, Every 8 hours, for endocarditis until 03/14/23 1159   sodium bicarbonate 1,300 mg, Oral, 3 times daily    Diet Orders (From admission, onward)     Start     Ordered   02/26/23 0616  Diet renal/carb modified with fluid restriction Diet-HS Snack? Nothing; Fluid restriction: 1200 mL Fluid; Room service appropriate? Yes; Fluid consistency: Thin  Diet effective now       Question Answer Comment  Diet-HS Snack? Nothing   Fluid restriction: 1200 mL Fluid   Room service appropriate? Yes   Fluid consistency: Thin      02/26/23 0616            DVT prophylaxis: SCDs Start: 02/26/23 2956   Lab Results  Component Value Date   PLT 127 (L) 02/27/2023      Code Status: Full Code  Family Communication: no family at bedside   Status is: Inpatient  Remains inpatient appropriate because: unsafe SNF discharge  at prior institution  Level of care: Telemetry Medical  Consultants:  Nephrology   Objective: Vitals:   02/26/23 1953 02/27/23 0357 02/27/23 0500 02/27/23 0832  BP: 95/71 102/86  108/76  Pulse: 83 72  83  Resp: 17 18  19   Temp: 99.3 F (37.4 C) 98.1 F (36.7 C)  99.1 F (37.3 C)  TempSrc: Oral Oral  Oral  SpO2: 100% 99%  100%  Weight:   96.8 kg   Height:        Intake/Output Summary (Last 24 hours) at 02/27/2023 1049 Last data filed at 02/26/2023 2017 Gross per 24 hour  Intake 100 ml  Output 2000 ml  Net -1900 ml   Wt Readings from Last 3 Encounters:  02/27/23 96.8 kg  01/29/23 96.6 kg  12/28/22 96.6 kg    Examination:  Constitutional: NAD Eyes: no scleral icterus ENMT: Mucous membranes are moist.  Neck: normal, supple Respiratory: clear to auscultation bilaterally, no wheezing, no crackles. Normal respiratory effort. No accessory muscle use.  Cardiovascular: Regular rate and  rhythm, no murmurs / rubs / gallops.  1+ edema Abdomen: non distended, no tenderness. Bowel sounds positive.  Musculoskeletal: no clubbing / cyanosis.    Data Reviewed: I have independently reviewed following labs and imaging studies   CBC Recent Labs  Lab 02/25/23 2345 02/26/23 0825 02/26/23 2026 02/27/23 0234  WBC 10.8* 8.7  --  11.2*  HGB 6.5* 6.1* 7.5* 7.0*  HCT 20.7* 19.6* 23.6* 22.3*  PLT 138* 136*  --  127*  MCV 100.0 97.5  --  91.8  MCH 31.4 30.3  --  28.8  MCHC 31.4 31.1  --  31.4  RDW 23.0* 22.9*  --  25.3*  LYMPHSABS 1.9  --   --   --   MONOABS 5.2*  --   --   --   EOSABS 0.0  --   --   --   BASOSABS 0.0  --   --   --     Recent Labs  Lab 02/25/23 2345 02/26/23 0825 02/27/23 0234  NA 132* 133*  132* 134*  K 3.6 3.4*  3.3* 3.4*  CL 93* 96*  96* 97*  CO2 25 26  25 26   GLUCOSE 112* 99  97 100*  BUN 17 18  18 13   CREATININE 5.15* 5.13*  5.07* 4.12*  CALCIUM 8.4* 8.0*  8.0* 8.1*  AST  --  14* 12*  ALT  --  <5 <5  ALKPHOS  --  102 98  BILITOT  --   0.8 0.7  ALBUMIN  --  1.5*  1.5* 1.5*  MG  --   --  1.7  HGBA1C  --  6.2*  --     ------------------------------------------------------------------------------------------------------------------ No results for input(s): "CHOL", "HDL", "LDLCALC", "TRIG", "CHOLHDL", "LDLDIRECT" in the last 72 hours.  Lab Results  Component Value Date   HGBA1C 6.2 (H) 02/26/2023   ------------------------------------------------------------------------------------------------------------------ No results for input(s): "TSH", "T4TOTAL", "T3FREE", "THYROIDAB" in the last 72 hours.  Invalid input(s): "FREET3"  Cardiac Enzymes No results for input(s): "CKMB", "TROPONINI", "MYOGLOBIN" in the last 168 hours.  Invalid input(s): "CK" ------------------------------------------------------------------------------------------------------------------    Component Value Date/Time   BNP 1,075.7 (H) 11/15/2015 0853    CBG: Recent Labs  Lab 02/26/23 0714 02/26/23 1244 02/26/23 1616 02/26/23 1953 02/27/23 0718  GLUCAP 101* 89 104* 114* 89    No results found for this or any previous visit (from the past 240 hour(s)).   Radiology Studies: No results found.   Pamella Pert, MD, PhD [facility]  Between 7 am - 7 pm I am available, please contact me via Amion (for emergencies) or Securechat (non urgent messages)  Between 7 pm - 7 am I am not available, please contact night coverage MD/APP via Amion

## 2023-02-27 NOTE — Evaluation (Signed)
Occupational Therapy Evaluation Patient Details Name: [name] MRN: [id number] DOB: 1960/11/26 Today's Date: 02/27/2023   History of Present Illness The pt is a 62 yo male presenting from SNF 6/21 with nosebleed, found to be anemic upon arrival. PMH includes: CAD s/p ACBG in 2016, HLD, HTN, DM II, ESR on HD, and anemia. Pt also with recent admission at [facility] for sepsis, PEA arrest, and R cerebellar CVA.   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 Pt reports he was kept largely bedbound at SNF with limited opportunity to mobilize. Pt now presents with decreased activity tolerance, general Bilateral UE weakness (worse in B shoulders and B hands), decreased sitting and standing balance during functional tasks, edematous 5th finger on Right hand affecting fine motor coordination, and decreased safety and independence with ADLs and functional transfers/mobility. Pt currently demonstrates ability to complete UB ADLs with Set up to Mod assist, LB ADLs with Max assist, and functional transfers/mobility with a RW (2 wheels) with Min guard to Min assist. Pt is motivated to increased strength, activity tolerance, and independence and participated well in skilled OT evaluation. Pt will benefit from acute skilled OT services to address deficits outlined below, decrease caregiver burden, and increase safety and independence with ADLs and functional transfers/mobility. Pt reports he has good support from son and girlfriend at home. Pt further reports his son works days and girlfriend works nights and someone can be with him for supervision/assistance approximately 23 hours per day with 1 hour of overlap when both son and girlfriend work/commute. Provided son and girlfriend are indeed able to provide frequent/constant supervision at home, pt will benefit from continued skilled OT services in the home post  acute discharge rather than returning to an intensive inpatient facility.       Recommendations for follow up therapy are one component of a multi-disciplinary discharge planning process, led by the attending physician.  Recommendations may be updated based on patient status, additional functional criteria and insurance authorization.   Assistance Recommended at Discharge Frequent or constant Supervision/Assistance  Patient can return home with the following A little help with walking and/or transfers;A lot of help with bathing/dressing/bathroom;Assistance with cooking/housework;Assistance with feeding;Assist for transportation;Help with stairs or ramp for entrance (Set up for self-feeding)    Functional Status Assessment  Patient has had a recent decline in their functional status and demonstrates the ability to make significant improvements in function in a reasonable and predictable amount of time.  Equipment Recommendations  BSC/3in1    Recommendations for Other Services       Precautions / Restrictions Precautions Precautions: Fall Restrictions Weight Bearing Restrictions: No      Mobility Bed Mobility Overal bed mobility: Needs Assistance Bed Mobility: Supine to Sit     Supine to sit: Supervision, HOB elevated     General bed mobility comments: no assist, pt using HOB elevated and rails.    Transfers Overall transfer level: Needs assistance Equipment used: Rolling walker (2 wheels) Transfers: Sit to/from Stand, Bed to chair/wheelchair/BSC Sit to Stand: Min assist, Min guard, From elevated surface     Step pivot transfers: Min guard     General transfer comment: minA of 2 to rise from low bed, progressed to minA/minG with elevated surface and use of armrests      Balance Overall balance assessment: Needs assistance Sitting-balance support: No upper extremity supported, Feet supported Sitting balance-Leahy Scale: Fair  Sitting balance - Comments: able to lean  slightly outside BOS   Standing balance support: Bilateral upper extremity supported, During functional activity, Reliant on assistive device for balance, Single extremity supported Standing balance-Leahy Scale: Poor Standing balance comment: dependent on at elast single UE support for static stance                           ADL either performed or assessed with clinical judgement   ADL Overall ADL's : Needs assistance/impaired Eating/Feeding: Set up;Sitting   Grooming: Min guard;Standing   Upper Body Bathing: Moderate assistance;Sitting   Lower Body Bathing: Maximal assistance;Sit to/from stand   Upper Body Dressing : Minimal assistance;Moderate assistance;Sitting   Lower Body Dressing: Maximal assistance;Sit to/from stand   Toilet Transfer: Min guard;Minimal assistance;Ambulation;BSC/3in1;Rolling walker (2 wheels) (BSC placed over toilet; Min assist to power up from sitting, otherwise Min guard assist.)   Toileting- Clothing Manipulation and Hygiene: Maximal assistance;Sit to/from stand       Functional mobility during ADLs: Min guard;Rolling walker (2 wheels)       Vision Baseline Vision/History: 0 No visual deficits Ability to See in Adequate Light: 0 Adequate Patient Visual Report: No change from baseline Vision Assessment?: No apparent visual deficits     Perception     Praxis Praxis Praxis tested?: Within functional limits    Pertinent Vitals/Pain Pain Assessment Pain Assessment: No/denies pain     Hand Dominance Right   Extremity/Trunk Assessment Upper Extremity Assessment Upper Extremity Assessment: Generalized weakness;RUE deficits/detail;LUE deficits/detail RUE Deficits / Details: Generalized weakness, worse in shoulder and hand. Edematous 5th digit of Right hand affecting functional use/fine motor coordination and with pt reporting discomfort with touching edematous finger. RUE Coordination: decreased fine motor LUE Deficits / Details:  Generalized weakness, worse in shoulder and hand.   Lower Extremity Assessment Lower Extremity Assessment: Defer to PT evaluation   Cervical / Trunk Assessment Cervical / Trunk Assessment: Normal   Communication Communication Communication: No difficulties   Cognition Arousal/Alertness: Awake/alert Behavior During Therapy: WFL for tasks assessed/performed Overall Cognitive Status: Within Functional Limits for tasks assessed                                 General Comments: not formally assessed at this time, pt able to follow simple commands, answer questions, and demos good memory of events thus far.     General Comments  VSS on RA throughout session    Exercises     Shoulder Instructions      Home Living Family/patient expects to be discharged to:: Private residence Living Arrangements: Spouse/significant other;Children Available Help at Discharge: Family;Available PRN/intermittently Type of Home: House Home Access: Ramped entrance     Home Layout: One level     Bathroom Shower/Tub: Chief Strategy Officer: Standard     Home Equipment: None   Additional Comments: information above for pt's girlfriend's house where he plans to eventually return home. pt from SNF      Prior Functioning/Environment Prior Level of Function : Needs assist       Physical Assist : Mobility (physical);ADLs (physical) Mobility (physical): Transfers;Gait ADLs (physical): Bathing;Dressing;Toileting Mobility Comments: Pt reports SNF staff assists with walking, he was using a RW and had a chair follow. Prior to hospital admission leading to SNF placment, pt reports he was Independent with ADLs. ADLs Comments: Pt reports sponge baths from staff in bed,  using bed pan/diapers at SNF. Prior to hospital admission leading to SNF placment, pt reports he was Independent with ADLs, IADLs, and worked detailing cars.        OT Problem List: Decreased strength;Decreased  activity tolerance;Impaired balance (sitting and/or standing);Decreased coordination;Decreased knowledge of use of DME or AE      OT Treatment/Interventions: Self-care/ADL training;Therapeutic exercise;Energy conservation;DME and/or AE instruction;Therapeutic activities;Patient/family education;Balance training    OT Goals(Current goals can be found in the care plan section) Acute Rehab OT Goals Patient Stated Goal: To move in with his girlfriend OT Goal Formulation: With patient Time For Goal Achievement: 03/13/23 Potential to Achieve Goals: Good ADL Goals Pt Will Perform Upper Body Bathing: with supervision;sitting Pt Will Perform Lower Body Bathing: with min guard assist;with adaptive equipment;sit to/from stand;sitting/lateral leans Pt Will Perform Lower Body Dressing: with min guard assist;with adaptive equipment;sit to/from stand Pt Will Transfer to Toilet: with supervision;ambulating;regular height toilet (with least restrictive AD) Pt Will Perform Toileting - Clothing Manipulation and hygiene: with min guard assist;sit to/from stand Pt/caregiver will Perform Home Exercise Program: Increased strength;Both right and left upper extremity;With theraband;With theraputty;With Supervision;With written HEP provided  OT Frequency: Min 2X/week    Co-evaluation PT/OT/SLP Co-Evaluation/Treatment: Yes Reason for Co-Treatment: Other (comment) (Concern for limited activity tolerance) PT goals addressed during session: Mobility/safety with mobility;Balance;Strengthening/ROM OT goals addressed during session: ADL's and self-care      AM-PAC OT "6 Clicks" Daily Activity     Outcome Measure Help from another person eating meals?: A Little Help from another person taking care of personal grooming?: A Little Help from another person toileting, which includes using toliet, bedpan, or urinal?: A Lot Help from another person bathing (including washing, rinsing, drying)?: A Lot Help from another  person to put on and taking off regular upper body clothing?: A Lot Help from another person to put on and taking off regular lower body clothing?: A Lot 6 Click Score: 14   End of Session Equipment Utilized During Treatment: Gait belt;Rolling walker (2 wheels);Other (comment) (BSC placed over toilet) Nurse Communication: Mobility status;Other (comment) (Pt had BM during sesison.)  Activity Tolerance: Patient tolerated treatment well Patient left: in chair;with call bell/phone within reach  OT Visit Diagnosis: Unsteadiness on feet (R26.81);Muscle weakness (generalized) (M62.81)                Time: 8119-1478 OT Time Calculation (min): 37 min Charges:  OT General Charges $OT Visit: 1 Visit OT Evaluation $OT Eval Low Complexity: 1 Low OT Treatments $Self Care/Home Management : 8-22 mins  Preciliano Castell "Orson Eva., OTR/L, MA Acute Rehab [phone]   Lendon Colonel 02/27/2023, 5:25 PM

## 2023-02-27 NOTE — Plan of Care (Signed)
  Problem: Education: Goal: Knowledge of disease and its progression will improve Outcome: Completed/Met Goal: Individualized Educational Video(s) Outcome: Not Applicable   

## 2023-02-27 NOTE — Progress Notes (Signed)
[facility] Progress Note   Subjective:   Patient seen and examined at bedside this AM.  Denies bleeding overnight.  Reports urinary incontinence x1.  Denies CP, SOB, dizziness and fatigue.  Reports pain in bilateral toes feet/toes that has been present since admission to [facility].  No change in severity.    Objective Vitals:   02/26/23 1953 02/27/23 0357 02/27/23 0500 02/27/23 0832  BP: 95/71 102/86  108/76  Pulse: 83 72  83  Resp: 17 18  19   Temp: 99.3 F (37.4 C) 98.1 F (36.7 C)  99.1 F (37.3 C)  TempSrc: Oral Oral  Oral  SpO2: 100% 99%  100%  Weight:   96.8 kg   Height:       Physical Exam General:chronically ill appearing male in NAD Heart:IRIR, no mrg Lungs:CTAB, nml WOB on RA Abdomen:soft, +LUQ tenderness Extremities:1+ LE edema b/l, chronic skin changes on b/l feet/toes +tenderness to palpation Dialysis Access: [facility]   Filed Weights   02/25/23 2326 02/27/23 0500  Weight: 96.6 kg 96.8 kg    Intake/Output Summary (Last 24 hours) at 02/27/2023 1015 Last data filed at 02/26/2023 2017 Gross per 24 hour  Intake 100 ml  Output 2000 ml  Net -1900 ml    Additional Objective Labs: Basic Metabolic Panel: Recent Labs  Lab 02/25/23 2345 02/26/23 0825 02/27/23 0234  NA 132* 133*  132* 134*  K 3.6 3.4*  3.3* 3.4*  CL 93* 96*  96* 97*  CO2 25 26  25 26   GLUCOSE 112* 99  97 100*  BUN 17 18  18 13   CREATININE 5.15* 5.13*  5.07* 4.12*  CALCIUM 8.4* 8.0*  8.0* 8.1*  PHOS  --  3.0  --    Liver Function Tests: Recent Labs  Lab 02/26/23 0825 02/27/23 0234  AST 14* 12*  ALT <5 <5  ALKPHOS 102 98  BILITOT 0.8 0.7  PROT 6.7 6.9  ALBUMIN 1.5*  1.5* 1.5*   CBC: Recent Labs  Lab 02/25/23 2345 02/26/23 0825 02/26/23 2026 02/27/23 0234  WBC 10.8* 8.7  --  11.2*  NEUTROABS 3.4  --   --   --   HGB 6.5* 6.1* 7.5* 7.0*  HCT 20.7* 19.6* 23.6* 22.3*  MCV 100.0 97.5  --  91.8  PLT 138* 136*  --  127*   Blood Culture    Component Value  Date/Time   SDES BLOOD RIGHT ARM 01/29/2023 1435   SPECREQUEST  01/29/2023 1435    BOTTLES DRAWN AEROBIC AND ANAEROBIC Blood Culture adequate volume   CULT  01/29/2023 1435    NO GROWTH 5 DAYS Performed at [facility], [address], [place], [place] [address]    REPTSTATUS 02/03/2023 FINAL 01/29/2023 1435   CBG: Recent Labs  Lab 02/26/23 0714 02/26/23 1244 02/26/23 1616 02/26/23 1953 02/27/23 0718  GLUCAP 101* 89 104* 114* 89    Medications:   sodium chloride   Intravenous Once   Chlorhexidine Gluconate Cloth  6 each Topical Q0600   insulin aspart  0-6 Units Subcutaneous TID WC   rifampin  300 mg Oral Q8H    Dialysis Orders: [facility]  3.5hrs, BFR 400, DFR 600,  EDW 94kg, 3K/ 2.5Ca   Access: TDC  Heparin none     Assessment/Plan:  Acute on chronic anemia of CKD - ABLA from nose bleed? Hgb 6.1 on admission, Last Hgb 7.6 on d/c from [facility]. Today 7.0 s/p 1 unit pRBC given with HD on 6/22.  Tsat 28% with ferritin 1445 on labs at HD on 6/20.  No IV iron.  Start ESA today.  Dialysis dependent AKI - Suspected ATN in setting of cardiac arrest/acute illness/septic shock further complicated by multiple contrast studies.  Little to no UOP per patient.  Continue HD on TTS schedule and monitor for renal recovery.  Renally dose medications.  Painful toes/fingers - per pt mostly unchanged since [facility] admit. Work in [facility] w/ABI w/normal pressures 1.0 b/l. Thought to be from levophed.  [facility] vascular consulted and per notes since he could not be heparinized there was not much they could do at that time.  May benefit from vascular consult while admitted if pain worsens.  Hypertension/volume  - BP low normal. Remains volume overloaded with LE edema.  Continue to challenge and lower dry weight as tolerated.   Secondary Hyperparathyroidism -  pth 32.  No indication for VDRA.  Calcium in goal.  Will check phos.   Nutrition - Renal diet w/fluid restrictions.  MSSA bacteremia - on Ancef  qHD 2g Tuesday and Thursday and 3g on Saturday. and rifampin until 03/14/23.  Orders put in for  Dispo - looking for new SNF   Virgina Norfolk, PA-C [facility] 02/27/2023,10:15 AM  LOS: 1 day

## 2023-02-27 NOTE — Plan of Care (Signed)
  Problem: Skin Integrity: Goal: Risk for impaired skin integrity will be minimized. Outcome: Not Progressing   Problem: Activity: Goal: Risk for activity intolerance will decrease Outcome: Not Progressing   Problem: Safety: Goal: Ability to remain free from injury will improve Outcome: Not Progressing

## 2023-02-27 NOTE — NC FL2 (Addendum)
[place] MEDICAID FL2 LEVEL OF CARE FORM     IDENTIFICATION  Patient Name: [name] Birthdate: 1961-03-18 Sex: male Admission Date (Current Location): 02/25/2023  [facility] and [place] Number:  Producer, television/film/video and Address:  [facility], [address], [place], [place] [address]      Provider Number: [id number]  Attending Physician Name and Address:  Leatha Gilding, MD  Relative Name and Phone Number:       Current Level of Care: Hospital Recommended Level of Care: Skilled Nursing Facility Prior Approval Number:    Date Approved/Denied:   PASRR Number: [id number]  Discharge Plan: SNF    Current Diagnoses: Patient Active Problem List   Diagnosis Date Noted   Anemia 02/26/2023   Sepsis (HCC) 01/29/2023   TTP (thrombotic thrombocytopenic purpura) (HCC) 01/29/2023   MSSA bacteremia 01/29/2023   PEA (Pulseless electrical activity) (HCC) 01/29/2023   Severe sepsis with septic shock (HCC) 01/29/2023   Prosthetic valve endocarditis (HCC) 01/29/2023   Influenza vaccine refused 10/09/2020   OSA on CPAP 02/06/2019   Macroalbuminuric diabetic nephropathy (HCC) 02/06/2019   TIA (transient ischemic attack) 04/10/2018   Colon cancer screening 05/04/2017   Cerebral infarction (HCC) 12/11/2015   Acute ischemic VBA thalamic stroke (HCC)    Atherosclerosis of native coronary artery of native heart without angina pectoris    H/O heart bypass surgery    Benign essential HTN    Type 2 diabetes mellitus without complication, without long-term current use of insulin (HCC)    S/P AVR 11/25/2015   CAD (coronary artery disease)    Chronic periodontitis 11/21/2015   Bicuspid aortic valve    Controlled type 2 diabetes mellitus with diabetic nephropathy, without long-term current use of insulin (HCC)    Essential hypertension 02/24/2015   Dyslipidemia 02/24/2015   GERD (gastroesophageal reflux disease) 05/02/2013   Primary gout 05/02/2013   Allergic  rhinitis due to allergen 07/09/2010    Orientation RESPIRATION BLADDER Height & Weight     Self, Time, Situation, Place  Normal Continent Weight: 213 lb 6.5 oz (96.8 kg) Height:  5\' 6"  (167.6 cm)  BEHAVIORAL SYMPTOMS/MOOD NEUROLOGICAL BOWEL NUTRITION STATUS      Continent    AMBULATORY STATUS COMMUNICATION OF NEEDS Skin   Extensive Assist Verbally Normal                       Personal Care Assistance Level of Assistance  Bathing, Feeding, Dressing Bathing Assistance: Limited assistance Feeding assistance: Limited assistance Dressing Assistance: Limited assistance     Functional Limitations Info  Sight, Hearing, Speech Sight Info: Adequate Hearing Info: Adequate Speech Info: Adequate    SPECIAL CARE FACTORS FREQUENCY  PT (By licensed PT), OT (By licensed OT)                    Contractures Contractures Info: Not present    Additional Factors Info  Code Status Code Status Info: FULL CODE             Current Medications (02/27/2023):  This is the current hospital active medication list Current Facility-Administered Medications  Medication Dose Route Frequency Provider Last Rate Last Admin   0.9 %  sodium chloride infusion (Manually program via Guardrails IV Fluids)   Intravenous Once Bufford Buttner, MD       albuterol (PROVENTIL) (2.5 MG/3ML) 0.083% nebulizer solution 2.5 mg  2.5 mg Nebulization Q2H PRN Lurline Del, MD  Chlorhexidine Gluconate Cloth 2 % PADS 6 each  6 each Topical Q0600 Bufford Buttner, MD   6 each at 02/26/23 1300   insulin aspart (novoLOG) injection 0-6 Units  0-6 Units Subcutaneous TID WC Skip Mayer A, MD       ondansetron [facility]) tablet 4 mg  4 mg Oral Q6H PRN Lurline Del, MD       Or   ondansetron [facility]) injection 4 mg  4 mg Intravenous Q6H PRN Skip Mayer A, MD       rifampin (RIFADIN) capsule 300 mg  300 mg Oral Q8H Skip Mayer A, MD   300 mg at 02/27/23 1610     Discharge  Medications: Please see discharge summary for a list of discharge medications.  Relevant Imaging Results:  Relevant Lab Results:   Additional Information SS# [id number]; [facility] TTS  Dellie Burns [place], [place]

## 2023-02-27 NOTE — Evaluation (Signed)
Physical Therapy Evaluation Patient Details Name: [name] MRN: [id number] DOB: 05-22-1961 Today's Date: 02/27/2023  History of Present Illness  The pt is a 62 yo male presenting from [facility] 6/21 with nosebleed. PMH includes: CAD s/p ACBG in 2016, HLD, HTN, DM II, ESR on HD, and anemia. Pt also with recent admission at [facility] for sepsis, PEA arrest, and R cerebellar CVA.   Clinical Impression  Pt in bed upon arrival of PT, agreeable to evaluation at this time. 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 He reports he was kept largely bedbound at SNF with limited opportunity to mobilize. The pt now presents with limitations in functional mobility, strength, power, endurance, and dynamic stability due to above dx, and will continue to benefit from skilled PT to address these deficits. He was able to complete bed mobility with supervision, but required minA to complete sit-stand transfers and short-distance ambulation in the room and hallway with use of RW. Due to swelling in BLE, pt with limited clearance, stride length, and DF, but he is motivated to improve strength, mobility, and independence to allow for d/c home with support from his son and girlfriend. Will continue to benefit from skilled PT acutely to progress safety with mobility so that pt can achieve goal of return home rather than return to SNF.        Recommendations for follow up therapy are one component of a multi-disciplinary discharge planning process, led by the attending physician.  Recommendations may be updated based on patient status, additional functional criteria and insurance authorization.  Follow Up Recommendations       Assistance Recommended at Discharge Intermittent Supervision/Assistance  Patient can return home with the following  A little help with walking and/or transfers;A little help with bathing/dressing/bathroom;Assistance with cooking/housework;Assist for  transportation;Help with stairs or ramp for entrance;Direct supervision/assist for medications management;Direct supervision/assist for financial management    Equipment Recommendations Rolling walker (2 wheels);BSC/3in1  Recommendations for Other Services       Functional Status Assessment Patient has had a recent decline in their functional status and demonstrates the ability to make significant improvements in function in a reasonable and predictable amount of time.     Precautions / Restrictions Precautions Precautions: Fall Restrictions Weight Bearing Restrictions: No      Mobility  Bed Mobility Overal bed mobility: Needs Assistance Bed Mobility: Supine to Sit     Supine to sit: Supervision, HOB elevated     General bed mobility comments: no assist, pt using HOB elevated and rails.    Transfers Overall transfer level: Needs assistance Equipment used: Rolling walker (2 wheels) Transfers: Sit to/from Stand, Bed to chair/wheelchair/BSC Sit to Stand: Min assist, Min guard, From elevated surface   Step pivot transfers: Min guard       General transfer comment: minA of 2 to rise from low bed, progressed to minA/minG with elevated surface and use of armrests    Ambulation/Gait Ambulation/Gait assistance: Min assist, Min guard Gait Distance (Feet): 25 Feet Assistive device: Rolling walker (2 wheels) Gait Pattern/deviations: Step-to pattern, Decreased stride length, Shuffle Gait velocity: decreased Gait velocity interpretation: <1.31 ft/sec, indicative of household ambulator   General Gait Details: small shuffling steps with limited clearance and DF. no overt LOB but limited by pt needing to use bathroom at this time. dependent on UE support for balance    Balance Overall balance assessment: Needs assistance Sitting-balance support: No upper extremity supported, Feet supported Sitting balance-Leahy  Scale: Fair Sitting balance - Comments: able to lean slightly  outside BOS   Standing balance support: Bilateral upper extremity supported, During functional activity, Reliant on assistive device for balance Standing balance-Leahy Scale: Poor Standing balance comment: dependent on at elast single UE support for static stance                             Pertinent Vitals/Pain Pain Assessment Pain Assessment: No/denies pain    Home Living Family/patient expects to be discharged to:: Private residence Living Arrangements: Spouse/significant other;Children Available Help at Discharge: Family;Available PRN/intermittently Type of Home: House Home Access: Ramped entrance       Home Layout: One level Home Equipment: None Additional Comments: information above for pt's girlfriend's house where he plans to eventually return home. pt from SNF    Prior Function Prior Level of Function : Needs assist       Physical Assist : Mobility (physical);ADLs (physical) Mobility (physical): Transfers;Gait ADLs (physical): Bathing;Dressing;Toileting Mobility Comments: pt reports staff assists with walking, he was using a RW and had a chair follow. ADLs Comments: pt reports sponge baths from staff in bed, using bed pan/diapers at SNF.     Hand Dominance        Extremity/Trunk Assessment   Upper Extremity Assessment Upper Extremity Assessment: Defer to OT evaluation    Lower Extremity Assessment Lower Extremity Assessment: Overall WFL for tasks assessed (no focal deficits, bilateral swelling, reports no change in sensation)    Cervical / Trunk Assessment Cervical / Trunk Assessment: Normal  Communication   Communication: No difficulties  Cognition Arousal/Alertness: Awake/alert Behavior During Therapy: WFL for tasks assessed/performed Overall Cognitive Status: Within Functional Limits for tasks assessed                                 General Comments: not formally assessed at this time, pt able to follow simple commands,  answer questions, and demos good memory of events thus far.        General Comments General comments (skin integrity, edema, etc.): VSS on RA    Exercises     Assessment/Plan    PT Assessment Patient needs continued PT services  PT Problem List Decreased strength;Decreased activity tolerance;Decreased balance;Decreased mobility       PT Treatment Interventions DME instruction;Gait training;Stair training;Functional mobility training;Therapeutic exercise;Therapeutic activities;Balance training;Patient/family education    PT Goals (Current goals can be found in the Care Plan section)  Acute Rehab PT Goals Patient Stated Goal: return home, not need SNF PT Goal Formulation: With patient Time For Goal Achievement: 03/13/23 Potential to Achieve Goals: Good    Frequency Min 3X/week     Co-evaluation PT/OT/SLP Co-Evaluation/Treatment: Yes Reason for Co-Treatment:  (concern for limited activity tolerance) PT goals addressed during session: Mobility/safety with mobility;Balance;Strengthening/ROM         AM-PAC PT "6 Clicks" Mobility  Outcome Measure Help needed turning from your back to your side while in a flat bed without using bedrails?: None Help needed moving from lying on your back to sitting on the side of a flat bed without using bedrails?: A Little Help needed moving to and from a bed to a chair (including a wheelchair)?: A Little Help needed standing up from a chair using your arms (e.g., wheelchair or bedside chair)?: A Little Help needed to walk in hospital room?: A Little Help needed climbing 3-5 steps with  a railing? : A Lot 6 Click Score: 18    End of Session Equipment Utilized During Treatment: Gait belt Activity Tolerance: Patient tolerated treatment well;Patient limited by fatigue Patient left: in chair;with call bell/phone within reach Nurse Communication: Mobility status PT Visit Diagnosis: Other abnormalities of gait and mobility (R26.89);Muscle  weakness (generalized) (M62.81);Unsteadiness on feet (R26.81)    Time: 5621-3086 PT Time Calculation (min) (ACUTE ONLY): 37 min   Charges:   PT Evaluation $PT Eval Low Complexity: 1 Low          Vickki Muff, PT, DPT   Acute Rehabilitation Department Office [phone] Secure Chat Communication Preferred  Ronnie Derby 02/27/2023, 3:26 PM

## 2023-02-27 NOTE — TOC Initial Note (Signed)
Transition of Care [facility]) - Initial/Assessment Note    Patient Details  Name: [name] MRN: [id number] Date of Birth: [birth year]  Transition [facility] Care [facility]) CM/SW Contact:    Deatra Robinson, LCSW Phone Number: 02/27/2023, 9:14 AM  Clinical Narrative: Pt admitted from [facility] where he was placed from [facility] for rehab. Pt states there is no air conditioning at [facility] and is requesting a new facility.   Spoke to [place] at [employer] who confirmed air conditioning is in the process of being repaired with expected completion date of Monday 6/24. [place] reports pt is able to return to [facility] if needed but will need new HTA auth.   Will begin new SNF search and f/u with offers as available. PT/OT pending. Pt is active for HD at [facility].           Dellie Burns, MSW, LCSW [phone] (coverage)            Expected Discharge Plan: Skilled Nursing Facility Barriers to Discharge: SNF Pending bed offer, Insurance Authorization   Patient Goals and CMS Choice   CMS Medicare.gov Compare Post Acute Care list provided to:: Patient Choice offered to / list presented to : Patient [place] ownership interest in [facility].provided to:: Patient    Expected Discharge Plan and Services                                              Prior Living Arrangements/Services   Lives with:: Facility Resident Patient language and need for interpreter reviewed:: No        Need for Family Participation in Patient Care: Yes (Comment) Care giver support system in place?: No (comment)   Criminal Activity/Legal Involvement Pertinent to Current Situation/Hospitalization: No - Comment as needed  Activities of Daily Living Home Assistive Devices/Equipment: None ADL Screening (condition at time of admission) Patient's cognitive ability adequate to safely complete daily activities?: No Is the patient deaf or have difficulty hearing?: No Does the  patient have difficulty seeing, even when wearing glasses/contacts?: No Does the patient have difficulty concentrating, remembering, or making decisions?: No Patient able to express need for assistance with ADLs?: Yes Does the patient have difficulty dressing or bathing?: Yes Independently performs ADLs?: No Communication: Independent Dressing (OT): Needs assistance Is this a change from baseline?: Pre-admission baseline Grooming: Needs assistance Is this a change from baseline?: Pre-admission baseline Feeding: Independent Bathing: Needs assistance Is this a change from baseline?: Pre-admission baseline In/Out Bed: Needs assistance Is this a change from baseline?: Pre-admission baseline Walks in Home: Needs assistance Is this a change from baseline?: Pre-admission baseline Does the patient have difficulty walking or climbing stairs?: Yes Weakness of Legs: Both Weakness of Arms/Hands: Both  Permission Sought/Granted Permission sought to share information with : Facility Industrial/product designer granted to share information with : Yes, Verbal Permission Granted              Emotional Assessment       Orientation: : Oriented to Self, Oriented to Place, Oriented to  Time, Oriented to Situation Alcohol / Substance Use: Not Applicable Psych Involvement: No (comment)  Admission diagnosis:  Epistaxis [R04.0] Anemia [D64.9] Anemia, unspecified type [D64.9] Patient Active Problem List   Diagnosis Date Noted   Anemia 02/26/2023   Sepsis (HCC) 01/29/2023   TTP (thrombotic thrombocytopenic purpura) (HCC) 01/29/2023  MSSA bacteremia 01/29/2023   PEA (Pulseless electrical activity) (HCC) 01/29/2023   Severe sepsis with septic shock (HCC) 01/29/2023   Prosthetic valve endocarditis (HCC) 01/29/2023   Influenza vaccine refused 10/09/2020   OSA on CPAP 02/06/2019   Macroalbuminuric diabetic nephropathy (HCC) 02/06/2019   TIA (transient ischemic attack) 04/10/2018   Colon  cancer screening 05/04/2017   Cerebral infarction (HCC) 12/11/2015   Acute ischemic VBA thalamic stroke (HCC)    Atherosclerosis of native coronary artery of native heart without angina pectoris    H/O heart bypass surgery    Benign essential HTN    Type 2 diabetes mellitus without complication, without long-term current use of insulin (HCC)    S/P AVR 11/25/2015   CAD (coronary artery disease)    Chronic periodontitis 11/21/2015   Bicuspid aortic valve    Controlled type 2 diabetes mellitus with diabetic nephropathy, without long-term current use of insulin (HCC)    Essential hypertension 02/24/2015   Dyslipidemia 02/24/2015   GERD (gastroesophageal reflux disease) 05/02/2013   Primary gout 05/02/2013   Allergic rhinitis due to allergen 07/09/2010   PCP:  Marcine Matar, MD Pharmacy:  No Pharmacies Listed    Social Determinants of Health (SDOH) Social History: SDOH Screenings   Food Insecurity: No Food Insecurity (02/26/2023)  Housing: Low Risk  (02/26/2023)  Transportation Needs: No Transportation Needs (02/26/2023)  Utilities: Not At Risk (02/26/2023)  Alcohol Screen: Low Risk  (11/11/2022)  Depression (PHQ2-9): Low Risk  (12/09/2022)  Financial Resource Strain: Low Risk  (12/09/2022)  Physical Activity: Inactive (12/09/2022)  Social Connections: Socially Isolated (11/11/2022)  Stress: No Stress Concern Present (12/09/2022)  Tobacco Use: Low Risk  (02/25/2023)   SDOH Interventions:     Readmission Risk Interventions     No data to display

## 2023-02-28 ENCOUNTER — Inpatient Hospital Stay (HOSPITAL_COMMUNITY): Payer: PPO

## 2023-02-28 DIAGNOSIS — I368 Other nonrheumatic tricuspid valve disorders: Secondary | ICD-10-CM | POA: Diagnosis not present

## 2023-02-28 DIAGNOSIS — R7881 Bacteremia: Secondary | ICD-10-CM | POA: Diagnosis not present

## 2023-02-28 DIAGNOSIS — M79669 Pain in unspecified lower leg: Secondary | ICD-10-CM | POA: Diagnosis not present

## 2023-02-28 DIAGNOSIS — B9561 Methicillin susceptible Staphylococcus aureus infection as the cause of diseases classified elsewhere: Secondary | ICD-10-CM | POA: Diagnosis not present

## 2023-02-28 DIAGNOSIS — E1121 Type 2 diabetes mellitus with diabetic nephropathy: Secondary | ICD-10-CM | POA: Diagnosis not present

## 2023-02-28 DIAGNOSIS — T8249XA Other complication of vascular dialysis catheter, initial encounter: Secondary | ICD-10-CM | POA: Diagnosis not present

## 2023-02-28 DIAGNOSIS — I482 Chronic atrial fibrillation, unspecified: Secondary | ICD-10-CM | POA: Diagnosis not present

## 2023-02-28 DIAGNOSIS — T8249XD Other complication of vascular dialysis catheter, subsequent encounter: Secondary | ICD-10-CM | POA: Diagnosis not present

## 2023-02-28 DIAGNOSIS — T826XXS Infection and inflammatory reaction due to cardiac valve prosthesis, sequela: Secondary | ICD-10-CM | POA: Diagnosis not present

## 2023-02-28 DIAGNOSIS — Z992 Dependence on renal dialysis: Secondary | ICD-10-CM | POA: Diagnosis not present

## 2023-02-28 DIAGNOSIS — N2581 Secondary hyperparathyroidism of renal origin: Secondary | ICD-10-CM | POA: Diagnosis not present

## 2023-02-28 DIAGNOSIS — E119 Type 2 diabetes mellitus without complications: Secondary | ICD-10-CM | POA: Diagnosis not present

## 2023-02-28 DIAGNOSIS — R531 Weakness: Secondary | ICD-10-CM | POA: Diagnosis not present

## 2023-02-28 DIAGNOSIS — M6281 Muscle weakness (generalized): Secondary | ICD-10-CM | POA: Diagnosis not present

## 2023-02-28 DIAGNOSIS — E1122 Type 2 diabetes mellitus with diabetic chronic kidney disease: Secondary | ICD-10-CM | POA: Diagnosis not present

## 2023-02-28 DIAGNOSIS — I129 Hypertensive chronic kidney disease with stage 1 through stage 4 chronic kidney disease, or unspecified chronic kidney disease: Secondary | ICD-10-CM | POA: Diagnosis not present

## 2023-02-28 DIAGNOSIS — I2510 Atherosclerotic heart disease of native coronary artery without angina pectoris: Secondary | ICD-10-CM | POA: Diagnosis not present

## 2023-02-28 DIAGNOSIS — N170 Acute kidney failure with tubular necrosis: Secondary | ICD-10-CM | POA: Diagnosis not present

## 2023-02-28 DIAGNOSIS — R2689 Other abnormalities of gait and mobility: Secondary | ICD-10-CM | POA: Diagnosis not present

## 2023-02-28 DIAGNOSIS — Z8673 Personal history of transient ischemic attack (TIA), and cerebral infarction without residual deficits: Secondary | ICD-10-CM | POA: Diagnosis not present

## 2023-02-28 DIAGNOSIS — D631 Anemia in chronic kidney disease: Secondary | ICD-10-CM | POA: Diagnosis not present

## 2023-02-28 DIAGNOSIS — R799 Abnormal finding of blood chemistry, unspecified: Secondary | ICD-10-CM | POA: Diagnosis present

## 2023-02-28 DIAGNOSIS — I120 Hypertensive chronic kidney disease with stage 5 chronic kidney disease or end stage renal disease: Secondary | ICD-10-CM | POA: Diagnosis not present

## 2023-02-28 DIAGNOSIS — E785 Hyperlipidemia, unspecified: Secondary | ICD-10-CM | POA: Diagnosis not present

## 2023-02-28 DIAGNOSIS — Z79899 Other long term (current) drug therapy: Secondary | ICD-10-CM | POA: Diagnosis not present

## 2023-02-28 DIAGNOSIS — R41841 Cognitive communication deficit: Secondary | ICD-10-CM | POA: Diagnosis not present

## 2023-02-28 DIAGNOSIS — N1831 Chronic kidney disease, stage 3a: Secondary | ICD-10-CM | POA: Diagnosis not present

## 2023-02-28 DIAGNOSIS — I10 Essential (primary) hypertension: Secondary | ICD-10-CM | POA: Diagnosis not present

## 2023-02-28 DIAGNOSIS — R059 Cough, unspecified: Secondary | ICD-10-CM | POA: Diagnosis not present

## 2023-02-28 DIAGNOSIS — D689 Coagulation defect, unspecified: Secondary | ICD-10-CM | POA: Diagnosis not present

## 2023-02-28 DIAGNOSIS — N186 End stage renal disease: Secondary | ICD-10-CM | POA: Diagnosis not present

## 2023-02-28 DIAGNOSIS — D649 Anemia, unspecified: Secondary | ICD-10-CM | POA: Diagnosis not present

## 2023-02-28 DIAGNOSIS — I4891 Unspecified atrial fibrillation: Secondary | ICD-10-CM | POA: Diagnosis not present

## 2023-02-28 LAB — CBC
HCT: 22.5 % — ABNORMAL LOW (ref 39.0–52.0)
Hemoglobin: 7.1 g/dL — ABNORMAL LOW (ref 13.0–17.0)
MCH: 29 pg (ref 26.0–34.0)
MCHC: 31.6 g/dL (ref 30.0–36.0)
MCV: 91.8 fL (ref 80.0–100.0)
Platelets: 127 10*3/uL — ABNORMAL LOW (ref 150–400)
RBC: 2.45 MIL/uL — ABNORMAL LOW (ref 4.22–5.81)
RDW: 24.8 % — ABNORMAL HIGH (ref 11.5–15.5)
WBC: 12.8 10*3/uL — ABNORMAL HIGH (ref 4.0–10.5)
nRBC: 0 % (ref 0.0–0.2)

## 2023-02-28 LAB — MAGNESIUM: Magnesium: 1.8 mg/dL (ref 1.7–2.4)

## 2023-02-28 LAB — COMPREHENSIVE METABOLIC PANEL
ALT: <5 U/L (ref 0–44)
AST: 13 U/L — ABNORMAL LOW (ref 15–41)
Albumin: 1.6 g/dL — ABNORMAL LOW (ref 3.5–5.0)
Alkaline Phosphatase: 110 U/L (ref 38–126)
Anion gap: 10 (ref 5–15)
BUN: 22 mg/dL (ref 8–23)
CO2: 26 mmol/L (ref 22–32)
Calcium: 8.2 mg/dL — ABNORMAL LOW (ref 8.9–10.3)
Chloride: 95 mmol/L — ABNORMAL LOW (ref 98–111)
Creatinine, Ser: 5.31 mg/dL — ABNORMAL HIGH (ref 0.61–1.24)
GFR, Estimated: 11 mL/min — ABNORMAL LOW (ref >60)
Glucose, Bld: 99 mg/dL (ref 70–99)
Potassium: 3.3 mmol/L — ABNORMAL LOW (ref 3.5–5.1)
Sodium: 131 mmol/L — ABNORMAL LOW (ref 135–145)
Total Bilirubin: 0.8 mg/dL (ref 0.3–1.2)
Total Protein: 6.9 g/dL (ref 6.5–8.1)

## 2023-02-28 LAB — GLUCOSE, CAPILLARY
Glucose-Capillary: 99 mg/dL (ref 70–99)
Glucose-Capillary: 95 mg/dL (ref 70–99)

## 2023-02-28 LAB — HEPATITIS B SURFACE ANTIBODY, QUANTITATIVE: Hep B S AB Quant (Post): 16.1 m[IU]/mL (ref >9.9)

## 2023-02-28 MED ORDER — GUAIFENESIN-DM 100-10 MG/5ML PO SYRP
5.0000 mL | ORAL_SOLUTION | ORAL | Status: DC | PRN
  Administered 2023-02-28: 5 mL via ORAL
  Filled 2023-02-28: qty 5

## 2023-02-28 MED ORDER — CHLORHEXIDINE GLUCONATE CLOTH 2 % EX PADS: 6.0000 | MEDICATED_PAD | Freq: Every day | CUTANEOUS | Status: DC

## 2023-02-28 MED ORDER — DARBEPOETIN ALFA 60 MCG/0.3ML IJ SOSY: 60.0000 ug | PREFILLED_SYRINGE | INTRAMUSCULAR | Status: DC

## 2023-02-28 NOTE — TOC Transition Note (Signed)
Transition of Care [facility]) - CM/SW Discharge Note   Patient Details  Name: [name] MRN: [id number] Date of Birth: 08/18/1961  Transition of Care [facility]) CM/SW Contact:  Lorri Frederick, LCSW Phone Number: 02/28/2023, 1:11 PM   Clinical Narrative:   Pt discharging to [facility].  RN call report to [phone].    Son [name] will transport and may need help getting pt into the vehicle.     Final next level of care: Skilled Nursing Facility Barriers to Discharge: Barriers Resolved   Patient Goals and CMS Choice CMS Medicare.gov Compare Post Acute Care list provided to:: Patient Choice offered to / list presented to : Patient  Discharge Placement                Patient chooses bed at: [facility] Patient to be transferred to facility by: son [name] Name of family member notified: son [name] Patient and family notified of of transfer: 02/28/23  Discharge Plan and Services Additional resources added to the After Visit Summary for                                       Social Determinants of Health (SDOH) Interventions SDOH Screenings   Food Insecurity: No Food Insecurity (02/26/2023)  Housing: Low Risk  (02/26/2023)  Transportation Needs: No Transportation Needs (02/26/2023)  Utilities: Not At Risk (02/26/2023)  Alcohol Screen: Low Risk  (11/11/2022)  Depression (PHQ2-9): Low Risk  (12/09/2022)  Financial Resource Strain: Low Risk  (12/09/2022)  Physical Activity: Inactive (12/09/2022)  Social Connections: Socially Isolated (11/11/2022)  Stress: No Stress Concern Present (12/09/2022)  Tobacco Use: Low Risk  (02/25/2023)     Readmission Risk Interventions     No data to display

## 2023-02-28 NOTE — Consult Note (Signed)
   [facility] CM Inpatient Consult   02/28/2023  [name] Jan 13, 1961 [id number]  [facility] [[facility]]  Accountable Care Organization [ACO] Patient: [employer]  Primary Care Provider:  Marcine Matar, MD, [facility]  Reviewed for less than 30 days readmission.  Patient was screened for hospitalization and on behalf of [facility] Care Coordination to assess for post hospital community care needs.  Patient transtioned to a skilled nursing facility level of care for post hospital transition.  No Care Coordination referral for [facility].  Patient needs are to be met for post hospital care at a skilled nursing facility level of care.  For questions or referrals, please contact:   Charlesetta Shanks, RN BSN CCM [facility]  [phone] business mobile phone Toll free office [phone]  *Concierge Line  [phone] Fax number: [phone] [email or web address]

## 2023-02-28 NOTE — Discharge Summary (Signed)
Physician Discharge Summary  [name] FAO:[id number] DOB: Jun 09, 1961 DOA: 02/25/2023  PCP: Marcine Matar, MD  Admit date: 02/25/2023 Discharge date: 02/28/2023  Admitted From: SNF Disposition:  SNF  Recommendations for Outpatient Follow-up:  Follow up with PCP in 1-2 weeks Continue Ancef with HD and rifampin as per original ID plans  Home Health: none Equipment/Devices: none  Discharge Condition: stable CODE STATUS: Full code Diet Orders (From admission, onward)     Start     Ordered   02/26/23 0616  Diet renal/carb modified with fluid restriction Diet-HS Snack? Nothing; Fluid restriction: 1200 mL Fluid; Room service appropriate? Yes; Fluid consistency: Thin  Diet effective now       Question Answer Comment  Diet-HS Snack? Nothing   Fluid restriction: 1200 mL Fluid   Room service appropriate? Yes   Fluid consistency: Thin      02/26/23 0616            HPI: Per admitting MD,  [name] is a 62 y.o. male with medical history significant of  T2DM, HTN, prior L thalamic stroke 2017, CAD s/p CABG, s/p AVR w/ bioprosthetic valve, CKD 3b (baseline 1.7-1.9)recent decline now ESRD MWF s/p last hospitalization, Afib not on AC due recent dx of septic embolic with associated Cerebellar CVA, who was admitted to [facility] 5/24-5/25/24 with diagnosis of MSSA bacteremia, Sepsis, TTP, PEA arrest. Patient presumed TTP base on finding on peripheral smear. TTP thought to be caused by plavix. Plans at that juncture made for transfer as no capability for plasmapheresis at [facility] during that period. OF note however prior to [facility], patient was started on CRRT. At [facility] patient  was able to be extubated and weaned of pressors. He was treated with Oxacillin and Rifampin for planned 6 week course (01/31/23 - 03/14/23) for MSSA bacteremia. Patient was also seen by hematology and with ADAMTS13 level of 81% TTP was ruled out. It appears patient  presentation was mainly due to sepsis form MSSA bacteremia as platelets normalized. Patient course was complicated by  R cerebellar CVA with MRI noting multiple foci of infarct. 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 Patient course was further complicated by gallstone with early cholangitis pancreatitis for which patient had ERCP completed with biliary sphincterotomy, balloon sweeps with 2 stones removed, plastic stent placed. Further complications included  right superficial cephalic vein thrombus, acute dvt of the left internal jugular vein,  as well as left superficial cephalic vein. Patient presents to ED from NH due to nose bleed. Of noted patient has stasis prior to arrival. Patient notes no current complaints , he denies any black stools, n/v/ abdominal pain, sob, or chest pain. He does noted inability to ambulate due to weakness from prolonged hospitalization.   Hospital Course / Discharge diagnoses: Principal problem Anemia, multifactorial -likely in the setting of renal disease as well as epistaxis.  Received a unit of packed red blood cells, hemoglobin improved and is now stable at 7.1.  Continue to monitor with HD's  Active problems Hx of MSSA bacteremia / septic shock associated with Tricuspid valve Endocarditis (during prior hospitalization), history of bioprosthetic aortic valve in 2017 - complicated by PEA/septic emboli.  Continue 2,2,3 g Ancef with HD and rifampin 300 mg po Q8h, up until 03/14/2023 Cough-has been going on for couple of weeks, chest x-ray unremarkable.  Symptomatic management CKDIIIb with progression to ESRD in setting of prolonged recent hospitalization - due to ATN  in setting of above, HD MWF.   Hx of Gallstone pancreatitis/ Cholangitis -s/p ERCP completed with biliary sphincterotomy, balloon sweeps with 2 stones removed, plastic stent placed.  Atrial fibrillation -CHADS VASc of 6, not on AC due to risk of bleeding related to septic embolic cva Recent  hx of Dvt left internal jugular vien, Right and left cephalic vein thrombus - no AC recommended due to hx of recent embolic cva  Thrombocytopenia  -returned to baseline, TTP ruled out  DMII - Last A1c 5.8% on 08/12/22 CAD s/p CABG - Neuro advising against AP/AC per [facility] notes HLD -continue home medications Gout  - hold home colchicine 0.6 mg BID, Used PRN at home   Sepsis ruled out   Discharge Instructions   Allergies as of 02/28/2023       Reactions   Other Anaphylaxis   Mushrooms  Not listed on MAR    Plavix [clopidogrel Bisulfate] Other (See Comments)   TTP Not listed on the [facility]   Fleet Enema [enema] Other (See Comments)   Unknown reaction   Lovenox [enoxaparin] Other (See Comments)   Unknown reaction   Morphine Other (See Comments)   Unknown reaction   Nsaids Other (See Comments)   Unknown reaction   Hydrocodone-acetaminophen Itching        Medication List     TAKE these medications    acetaminophen 325 MG tablet Commonly known as: TYLENOL Take 650 mg by mouth every 4 (four) hours as needed for mild pain.   acetaminophen 500 MG tablet Commonly known as: TYLENOL Take 1,000 mg by mouth every 8 (eight) hours.   amLODipine 10 MG tablet Commonly known as: NORVASC Take 1 tablet by mouth once daily   Artificial Tears 1.4 % ophthalmic solution Generic drug: polyvinyl alcohol Place 1 drop into both eyes every 6 (six) hours as needed for dry eyes.   atorvastatin 80 MG tablet Commonly known as: LIPITOR TAKE 1 TABLET BY MOUTH ONCE DAILY AT 6 IN THE EVENING What changed:  how much to take how to take this when to take this additional instructions   carvedilol 6.25 MG tablet Commonly known as: COREG Take 1 tablet (6.25 mg total) by mouth 2 (two) times daily.   colchicine 0.6 MG tablet Take 1 tablet (0.6 mg total) by mouth 2 (two) times daily as needed.   ezetimibe 10 MG tablet Commonly known as: ZETIA Take 1 tablet (10 mg total) by mouth daily.    Farxiga 10 MG Tabs tablet Generic drug: dapagliflozin propanediol Take 10 mg by mouth daily.   fluticasone 50 MCG/ACT nasal spray Commonly known as: FLONASE Use 2 spray(s) in each nostril once daily   furosemide 40 MG tablet Commonly known as: LASIX 1 tablet p.o. q. Mondays, Wednesday and Fridays.   gabapentin 100 MG capsule Commonly known as: NEURONTIN Take 100 mg by mouth at bedtime.   ipratropium-albuterol 0.5-2.5 (3) MG/3ML Soln Commonly known as: DUONEB Take 3 mLs by nebulization every 6 (six) hours as needed (for wheezing).   Lidocaine Pain Relief 4 % Generic drug: lidocaine Place 1 patch onto the skin daily.   lisinopril 40 MG tablet Commonly known as: ZESTRIL Take 1 tablet (40 mg total) by mouth daily.   neomycin-polymyxin b-dexamethasone 3.5-10000-0.1 Oint Commonly known as: MAXITROL Place 1 Application into the right eye in the morning and at bedtime. for 10 days starting 0618/2024   potassium chloride SA 20 MEQ tablet Commonly known as: KLOR-CON M TAKE 1 TABLET BY MOUTH ON  MONDAY, Wed, FRIDAY and Saturdays.   rifampin 300 MG capsule Commonly known as: RIFADIN Take 300 mg by mouth every 8 (eight) hours. for endocarditis until 03/14/23 1159   sodium bicarbonate 650 MG tablet Take 1,300 mg by mouth 3 (three) times daily.         Consultations: Neprholgoy   Procedures/Studies:  DG CHEST PORT 1 VIEW  Result Date: 02/28/2023 CLINICAL DATA:  Productive cough for 2 days EXAM: PORTABLE CHEST 1 VIEW COMPARISON:  01/29/2023 FINDINGS: Dialysis catheter is now seen on the right. Cardiac shadow is stable. Postsurgical changes are noted. Lungs are clear bilaterally. No bony abnormality is seen. IMPRESSION: No acute abnormality noted. Electronically Signed   By: Alcide Clever M.D.   On: 02/28/2023 09:50   CT HEAD WO CONTRAST ( )  Result Date: 01/29/2023 CLINICAL DATA:  Mental status change, persistent or worsening. EXAM: CT HEAD WITHOUT CONTRAST TECHNIQUE:  Contiguous axial images were obtained from the base of the skull through the vertex without intravenous contrast. RADIATION DOSE REDUCTION: This exam was performed according to the departmental dose-optimization program which includes automated exposure control, adjustment of the mA and/or kV according to patient size and/or use of iterative reconstruction technique. COMPARISON:  Head CT 04/10/2018 and MRI 04/11/2018 FINDINGS: Brain: There is no evidence of an acute large territory infarct, intracranial hemorrhage, mass, midline shift, or extra-axial fluid collection. A small region of cortical and subcortical hypodensity in the left frontal lobe is new and compatible with an age-indeterminate infarct. There is mild cerebral atrophy. A tiny chronic left thalamic infarct is again noted. Vascular: Calcified atherosclerosis at the skull base. No hyperdense vessel. Skull: No acute fracture or suspicious osseous lesion. Sinuses/Orbits: Mucosal thickening in the maxillary sinuses and fluid in the sphenoid sinuses in the setting of intubation. No significant mastoid fluid. Bilateral proptosis. Other: None. IMPRESSION: Small age-indeterminate left frontal infarct. Electronically Signed   By: Sebastian Ache M.D.   On: 01/29/2023 18:04     Subjective: - no chest pain, shortness of breath, no abdominal pain, nausea or vomiting.   Discharge Exam: BP 120/77 (BP Location: Right Arm)   Pulse 63   Temp 98 F (36.7 C) (Oral)   Resp 18   Ht 5\' 6"  (1.676 m)   Wt 96.8 kg   SpO2 100%   BMI 34.44 kg/m   General: Pt is alert, awake, not in acute distress Cardiovascular: RRR, S1/S2 +, no rubs, no gallops Respiratory: CTA bilaterally, no wheezing, no rhonchi Abdominal: Soft, NT, ND, bowel sounds + Extremities: no edema, no cyanosis    The results of significant diagnostics from this hospitalization (including imaging, microbiology, ancillary and laboratory) are listed below for reference.     Microbiology: No  results found for this or any previous visit (from the past 240 hour(s)).   Labs: Basic Metabolic Panel: Recent Labs  Lab 02/25/23 2345 02/26/23 0825 02/27/23 0234 02/28/23 0141  NA 132* 133*  132* 134* 131*  K 3.6 3.4*  3.3* 3.4* 3.3*  CL 93* 96*  96* 97* 95*  CO2 25 26  25 26 26   GLUCOSE 112* 99  97 100* 99  BUN 17 18  18 13 22   CREATININE 5.15* 5.13*  5.07* 4.12* 5.31*  CALCIUM 8.4* 8.0*  8.0* 8.1* 8.2*  MG  --   --  1.7 1.8  PHOS  --  3.0  --   --    Liver Function Tests: Recent Labs  Lab 02/26/23 0825 02/27/23 0234 02/28/23 0141  AST 14* 12* 13*  ALT <5 <5 <5  ALKPHOS 102 98 110  BILITOT 0.8 0.7 0.8  PROT 6.7 6.9 6.9  ALBUMIN 1.5*  1.5* 1.5* 1.6*   CBC: Recent Labs  Lab 02/25/23 2345 02/26/23 0825 02/26/23 2026 02/27/23 0234 02/28/23 0141  WBC 10.8* 8.7  --  11.2* 12.8*  NEUTROABS 3.4  --   --   --   --   HGB 6.5* 6.1* 7.5* 7.0* 7.1*  HCT 20.7* 19.6* 23.6* 22.3* 22.5*  MCV 100.0 97.5  --  91.8 91.8  PLT 138* 136*  --  127* 127*   CBG: Recent Labs  Lab 02/27/23 1131 02/27/23 1642 02/27/23 2056 02/28/23 0726 02/28/23 1124  GLUCAP 124* 112* 104* 99 95   Hgb A1c Recent Labs    02/26/23 0825  HGBA1C 6.2*   Lipid Profile No results for input(s): "CHOL", "HDL", "LDLCALC", "TRIG", "CHOLHDL", "LDLDIRECT" in the last 72 hours. Thyroid function studies No results for input(s): "TSH", "T4TOTAL", "T3FREE", "THYROIDAB" in the last 72 hours.  Invalid input(s): "FREET3" Urinalysis    Component Value Date/Time   COLORURINE AMBER (A) 01/29/2023 0745   APPEARANCEUR TURBID (A) 01/29/2023 0745   LABSPEC 1.031 (H) 01/29/2023 0745   PHURINE 5.0 01/29/2023 0745   GLUCOSEU NEGATIVE 01/29/2023 0745   HGBUR MODERATE (A) 01/29/2023 0745   HGBUR negative 04/09/2010 0814   BILIRUBINUR SMALL (A) 01/29/2023 0745   KETONESUR NEGATIVE 01/29/2023 0745   PROTEINUR >=300 (A) 01/29/2023 0745   UROBILINOGEN 0.2 02/24/2015 1601   NITRITE NEGATIVE 01/29/2023  0745   LEUKOCYTESUR NEGATIVE 01/29/2023 0745    FURTHER DISCHARGE INSTRUCTIONS:   Get Medicines reviewed and adjusted: Please take all your medications with you for your next visit with your Primary MD   Laboratory/radiological data: Please request your Primary MD to go over all hospital tests and procedure/radiological results at the follow up, please ask your Primary MD to get all Hospital records sent to his/her office.   In some cases, they will be blood work, cultures and biopsy results pending at the time of your discharge. Please request that your primary care M.D. goes through all the records of your hospital data and follows up on these results.   Also Note the following: If you experience worsening of your admission symptoms, develop shortness of breath, life threatening emergency, suicidal or homicidal thoughts you must seek medical attention immediately by calling 911 or calling your MD immediately  if symptoms less severe.   You must read complete instructions/literature along with all the possible adverse reactions/side effects for all the Medicines you take and that have been prescribed to you. Take any new Medicines after you have completely understood and accpet all the possible adverse reactions/side effects.    Do not drive when taking Pain medications or sleeping medications (Benzodaizepines)   Do not take more than prescribed Pain, Sleep and Anxiety Medications. It is not advisable to combine anxiety,sleep and pain medications without talking with your primary care practitioner   Special Instructions: If you have smoked or chewed Tobacco  in the last 2 yrs please stop smoking, stop any regular Alcohol  and or any Recreational drug use.   Wear Seat belts while driving.   Please note: You were cared for by a hospitalist during your hospital stay. Once you are discharged, your primary care physician will handle any further medical issues. Please note that NO REFILLS for  any discharge medications will be authorized once you are discharged, as it is  imperative that you return to your primary care physician (or establish a relationship with a primary care physician if you do not have one) for your post hospital discharge needs so that they can reassess your need for medications and monitor your lab values.  Time coordinating discharge: 35 minutes  SIGNED:  Pamella Pert, MD, PhD 02/28/2023, 12:52 PM

## 2023-02-28 NOTE — Progress Notes (Addendum)
Pt receives out-pt HD at [facility] on TTS. Pt arrives at 6:40 am for 7:00 am chair time. This info was provided to treatment team for d/c planning purposes. Will assist as needed.   Olivia Canter Renal Navigator [phone]  Addendum at 1:12 pm: Pt to d/c to new snf this afternoon. Contacted [facility] to advise clinic of pt's d/c today and that pt should resume care tomorrow. Clinic advised that pt will d/c to new snf [facility]).

## 2023-02-28 NOTE — Plan of Care (Signed)
[facility] Patient Discharge Orders- [facility] CLINIC: [facility]  Patient's name: [name] Admit/DC Dates: 02/25/2023 - 02/28/2023  Discharge Diagnoses: Acute/chronic anemia 2/2 epistaxis.  Hgb 6.1 on admission- transfused Dialysis dependent AKI - continue HD MSSA bacteremia (PTA)   Aranesp: Given: Yes    Date and amount of last dose: 6/23 -- Aranesp 60 mcg  Last Hgb: 7.1 PRBC's Given: Yes Date/# of units: 6/22 1 U ESA dose for discharge: Mircera 75 mcg IV q 2 weeks  IV Iron dose at discharge: -- Tsat 28% Ferritin 1445   Heparin change: -- No heparin   EDW Change: --- New EDW:   Bath Change: --  Access intervention/Change: --- Details:  Hectorol/Calcitriol change: --  Discharge Labs: Calcium 8.2 Phosphorus 3.0 Albumin 1.6 K+ 3.3  IV Antibiotics: Continue Ancef q HD 2g IV Tues/Thurs, 3g IV Sat until 03/14/23  Details:  On Coumadin?: --- Last INR: Next INR: Managed By:   OTHER/APPTS/LAB ORDERS:  Discharge to [facility] SNF   D/C Meds to be reconciled by nurse after every discharge.  Completed By: Tomasa Blase PA-C [facility] 02/28/2023,2:24 PM   Reviewed by: MD:______ RN_______

## 2023-02-28 NOTE — Plan of Care (Signed)
  Problem: Pain Managment: Goal: General experience of comfort will improve Outcome: Progressing   Problem: Safety: Goal: Ability to remain free from injury will improve Outcome: Progressing   Problem: Skin Integrity: Goal: Risk for impaired skin integrity will decrease Outcome: Progressing   

## 2023-02-28 NOTE — Progress Notes (Signed)
[facility] Progress Note  Subjective: pt seen in room. Pt up in chair, no c/o's. Feeling much better.   Vitals:   02/27/23 1626 02/27/23 1940 02/28/23 0458 02/28/23 0907  BP: (!) 118/90 109/78 (!) 151/133 120/77  Pulse: 70 (!) 49 80 63  Resp: 19 17 18 18   Temp: 98.4 F (36.9 C) 98.1 F (36.7 C) 98.3 F (36.8 C) 98 F (36.7 C)  TempSrc: Oral Oral Oral Oral  SpO2: 100% 100% 100% 100%  Weight:      Height:        Exam: General:chronically ill appearing male in NAD Heart:IRIR, no mrg Lungs:CTAB, nml WOB on RA Abdomen:soft, +LUQ tenderness Extremities:1+ LE edema b/l, chronic skin changes on b/l feet/toes Dialysis Access: TDC     [facility]    3.5h  400/ 600   94kg  3K/2.5Ca bath  Hep none  RIJ TDC    Assessment/ Plan: Acute on chronic anemia of CKD - ABLA from nose bleed? Hgb 6.1 on admission, Last Hgb 7.6 on d/c from [facility]. Tsat 28% with ferritin 1445 on labs at HD on 6/20.  No IV iron.  Started ESA today.  Dialysis dependent AKI - Suspected ATN in setting of cardiac arrest/acute illness/septic shock further complicated by multiple contrast studies, this was at [facility] about 1 month ago.  Pt was dc'd from [facility] to SNF w/ outpatient HD at [facility] on TTS schedule. Continue HD here on TTS schedule. Will monitor for signs of renal recovery.  Renally dose medications. HD tomorrow.  Painful toes/fingers - per pt mostly unchanged since [facility] admit. Work in [facility] w/ABI w/normal pressures 1.0 b/l. Thought to be from levophed.  [facility] vascular consulted and per notes since he could not be heparinized there was not much they could do at that time.  May consider vascular consult while here if pain worsens.  Hypertension/volume  - BP low normal. Looks mildly vol up on exam. Is 2- 3kg over edw  Secondary Hyperparathyroidism -  pth 32.  No indication for VDRA.  Calcium in goal.  Will check phos.   Nutrition - Renal diet w/fluid restrictions.  MSSA bacteremia - on Ancef qHD 2g  Tuesday and Thursday and 3g on Saturday. Also rifampin until 03/14/23.  Dispo - looking for new SNF    Rob Naftula Donahue MD  [facility] 02/28/2023, 11:02 AM  Recent Labs  Lab 02/26/23 0825 02/26/23 2026 02/27/23 0234 02/28/23 0141  HGB 6.1*   < > 7.0* 7.1*  ALBUMIN 1.5*  1.5*  --  1.5* 1.6*  CALCIUM 8.0*  8.0*  --  8.1* 8.2*  PHOS 3.0  --   --   --   CREATININE 5.13*  5.07*  --  4.12* 5.31*  K 3.4*  3.3*  --  3.4* 3.3*   < > = values in this interval not displayed.   No results for input(s): "IRON", "TIBC", "FERRITIN" in the last 168 hours. Inpatient medications:  atorvastatin  80 mg Oral QHS   carvedilol  6.25 mg Oral BID   Chlorhexidine Gluconate Cloth  6 each Topical Q0600   ezetimibe  10 mg Oral Daily   gabapentin  100 mg Oral QHS   insulin aspart  0-6 Units Subcutaneous TID WC   lidocaine  1 patch Transdermal Daily   neomycin-polymyxin b-dexamethasone  1 Application Right Eye TID   rifampin  300 mg Oral Q8H   sodium bicarbonate  1,300 mg Oral TID    [START  ON 03/01/2023]  ceFAZolin (ANCEF) IV     And   [START ON 03/05/2023]  ceFAZolin (ANCEF) IV     albuterol, camphor-menthol, guaiFENesin-dextromethorphan, ondansetron **OR** ondansetron (ZOFRAN) IV, polyvinyl alcohol

## 2023-02-28 NOTE — Progress Notes (Deleted)
PROGRESS NOTE  [name] WUJ:[id number] DOB: 08-11-1961 DOA: 02/25/2023 PCP: Marcine Matar, MD   LOS: 2 days   Brief Narrative / Interim history: 62 year old male with history of DM2, HTN, prior left thalamic stroke 2017, CAD s/p CABG, s/p AVR w/ bioprosthetic valve, CKD 3b (baseline 1.7-1.9)recent decline now ESRD MWF s/p last hospitalization, Afib not on AC due recent dx of septic embolic with associated Cerebellar CVA, who was admitted to [facility] 5/24-5/25/24 with diagnosis of MSSA bacteremia, Sepsis, TTP, PEA arrest. Patient presumed TTP base on finding on peripheral smear. TTP thought to be caused by plavix. Plans at that juncture made for transfer as no capability for plasmapheresis at [facility] during that period. OF note however prior to transfer to [facility], patient was started on CRRT. At [facility]  was able to be extubated and weaned of pressors. He was treated with Oxacillin and Rifampin for planned 6 week course (01/31/23 - 03/14/23) for MSSA bacteremia. Patient was also seen by hematology and with ADAMTS13 level of 81% TTP was ruled out. It appears patient presentation was mainly due to sepsis form MSSA bacteremia as platelets normalized. Patient course was complicated by  R cerebellar CVA with MRI noting multiple foci of infarct. 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 Patient course was further complicated by gallstone with early cholangitis pancreatitis for which patient had ERCP completed with biliary sphincterotomy, balloon sweeps with 2 stones removed, plastic stent placed. Further complications included  right superficial cephalic vein thrombus, acute dvt of the left internal jugular vein,  as well as left superficial cephalic vein. Patient presents to ED from NH due to nose bleed   Subjective / 24h Interval events: Complains of cough and some congestion.  Assesement and Plan: Principal problem Anemia, multifactorial -likely  in the setting of renal disease as well as epistaxis.  Received a unit of packed red blood cells, hemoglobin improved and is now stable at 7.1.  Continue to monitor  Active problems Hx of MSSA bacteremia / septic shock associated with Tricuspid valve Endocarditis (during prior hospitalization), history of bioprosthetic aortic valve in 2017 - complicated by PEA/septic emboli.  Continue 3 g Ancef with HD and rifampin 300 mg po Q8h, up until 03/14/2023  Cough-chest x-ray unremarkable.  Symptomatic management  CKDIIIb with progression to ESRD in setting of prolonged recent hospitalization - due to ATN in setting of above, HD MWF.  Nephrology following   Hx of Gallstone pancreatitis/ Cholangitis -s/p ERCP completed with biliary sphincterotomy, balloon sweeps with 2 stones removed, plastic stent placed.   Atrial fibrillation -CHADS VASc of 6, not on AC due to risk of bleeding related to septic embolic cva   Recent hx of Dvt left internal jugular vien, Right and left cephalic vein thrombus - no AC recommended due to hx of recent embolic cva    Thrombocytopenia  -returned to baseline, TTP ruled out    DMII - Last A1c 5.8% on 08/12/22  CAD s/p CABG - Neuro advising against AP/AC per [facility] notes  HLD -continue home medications  Gout  - hold home colchicine 0.6 mg BID, Used PRN at home   Scheduled Meds:  atorvastatin  80 mg Oral QHS   carvedilol  6.25 mg Oral BID   Chlorhexidine Gluconate Cloth  6 each Topical Q0600   [START ON 03/06/2023] darbepoetin (ARANESP) injection - DIALYSIS  60 mcg Subcutaneous Q Sun-1800   ezetimibe  10 mg Oral Daily  gabapentin  100 mg Oral QHS   insulin aspart  0-6 Units Subcutaneous TID WC   lidocaine  1 patch Transdermal Daily   neomycin-polymyxin b-dexamethasone  1 Application Right Eye TID   rifampin  300 mg Oral Q8H   sodium bicarbonate  1,300 mg Oral TID   Continuous Infusions:  [START ON 03/01/2023]  ceFAZolin (ANCEF) IV     And   [START ON 03/05/2023]   ceFAZolin (ANCEF) IV     PRN Meds:.albuterol, camphor-menthol, guaiFENesin-dextromethorphan, ondansetron **OR** ondansetron (ZOFRAN) IV, polyvinyl alcohol  Current Outpatient Medications  Medication Instructions   acetaminophen (TYLENOL) 650 mg, Oral, Every 4 hours PRN   acetaminophen (TYLENOL) 1,000 mg, Oral, Every 8 hours   amLODipine (NORVASC) 10 mg, Oral, Daily   atorvastatin (LIPITOR) 80 MG tablet TAKE 1 TABLET BY MOUTH ONCE DAILY AT 6 IN THE EVENING   carvedilol (COREG) 6.25 mg, Oral, 2 times daily   colchicine 0.6 mg, Oral, 2 times daily PRN   ezetimibe (ZETIA) 10 mg, Oral, Daily   Farxiga 10 mg, Daily   fluticasone (FLONASE) 50 MCG/ACT nasal spray Use 2 spray(s) in each nostril once daily   furosemide (LASIX) 40 MG tablet 1 tablet p.o. q. Mondays, Wednesday and Fridays.   gabapentin (NEURONTIN) 100 mg, Oral, Daily at bedtime   ipratropium-albuterol (DUONEB) 0.5-2.5 (3) MG/3ML SOLN 3 mLs, Nebulization, Every 6 hours PRN   lidocaine (LIDOCAINE PAIN RELIEF) 4 % 1 patch, Transdermal, Daily   lisinopril (ZESTRIL) 40 mg, Oral, Daily   neomycin-polymyxin b-dexamethasone (MAXITROL) 3.5-10000-0.1 OINT 1 Application, Right Eye, 2 times daily, for 10 days starting 0618/2024   polyvinyl alcohol (ARTIFICIAL TEARS) 1.4 % ophthalmic solution 1 drop, Both Eyes, Every 6 hours PRN   potassium chloride SA (KLOR-CON M) 20 MEQ tablet TAKE 1 TABLET BY MOUTH ON MONDAY, Wed, FRIDAY and Saturdays.   rifampin (RIFADIN) 300 mg, Oral, Every 8 hours, for endocarditis until 03/14/23 1159   sodium bicarbonate 1,300 mg, Oral, 3 times daily    Diet Orders (From admission, onward)     Start     Ordered   02/26/23 0616  Diet renal/carb modified with fluid restriction Diet-HS Snack? Nothing; Fluid restriction: 1200 mL Fluid; Room service appropriate? Yes; Fluid consistency: Thin  Diet effective now       Question Answer Comment  Diet-HS Snack? Nothing   Fluid restriction: 1200 mL Fluid   Room service  appropriate? Yes   Fluid consistency: Thin      02/26/23 0616            DVT prophylaxis: SCDs Start: 02/26/23 8295   Lab Results  Component Value Date   PLT 127 (L) 02/28/2023      Code Status: Full Code  Family Communication: no family at bedside   Status is: Inpatient  Remains inpatient appropriate because: unsafe SNF discharge at prior institution  Level of care: Telemetry Medical  Consultants:  Nephrology   Objective: Vitals:   02/27/23 1626 02/27/23 1940 02/28/23 0458 02/28/23 0907  BP: (!) 118/90 109/78 (!) 151/133 120/77  Pulse: 70 (!) 49 80 63  Resp: 19 17 18 18   Temp: 98.4 F (36.9 C) 98.1 F (36.7 C) 98.3 F (36.8 C) 98 F (36.7 C)  TempSrc: Oral Oral Oral Oral  SpO2: 100% 100% 100% 100%  Weight:      Height:        Intake/Output Summary (Last 24 hours) at 02/28/2023 1135 Last data filed at 02/28/2023 0800 Gross per 24 hour  Intake 1260 ml  Output 0 ml  Net 1260 ml    Wt Readings from Last 3 Encounters:  02/27/23 96.8 kg  01/29/23 96.6 kg  12/28/22 96.6 kg    Examination:  Constitutional: NAD Eyes: lids and conjunctivae normal, no scleral icterus ENMT: mmm Neck: normal, supple Respiratory: clear to auscultation bilaterally, no wheezing, no crackles. Normal respiratory effort.  Cardiovascular: Regular rate and rhythm, no murmurs / rubs / gallops. 1+ edema Abdomen: soft, no distention, no tenderness. Bowel sounds positive.   Data Reviewed: I have independently reviewed following labs and imaging studies   CBC Recent Labs  Lab 02/25/23 2345 02/26/23 0825 02/26/23 2026 02/27/23 0234 02/28/23 0141  WBC 10.8* 8.7  --  11.2* 12.8*  HGB 6.5* 6.1* 7.5* 7.0* 7.1*  HCT 20.7* 19.6* 23.6* 22.3* 22.5*  PLT 138* 136*  --  127* 127*  MCV 100.0 97.5  --  91.8 91.8  MCH 31.4 30.3  --  28.8 29.0  MCHC 31.4 31.1  --  31.4 31.6  RDW 23.0* 22.9*  --  25.3* 24.8*  LYMPHSABS 1.9  --   --   --   --   MONOABS 5.2*  --   --   --   --   EOSABS  0.0  --   --   --   --   BASOSABS 0.0  --   --   --   --      Recent Labs  Lab 02/25/23 2345 02/26/23 0825 02/27/23 0234 02/28/23 0141  NA 132* 133*  132* 134* 131*  K 3.6 3.4*  3.3* 3.4* 3.3*  CL 93* 96*  96* 97* 95*  CO2 25 26  25 26 26   GLUCOSE 112* 99  97 100* 99  BUN 17 18  18 13 22   CREATININE 5.15* 5.13*  5.07* 4.12* 5.31*  CALCIUM 8.4* 8.0*  8.0* 8.1* 8.2*  AST  --  14* 12* 13*  ALT  --  <5 <5 <5  ALKPHOS  --  102 98 110  BILITOT  --  0.8 0.7 0.8  ALBUMIN  --  1.5*  1.5* 1.5* 1.6*  MG  --   --  1.7 1.8  HGBA1C  --  6.2*  --   --      ------------------------------------------------------------------------------------------------------------------ No results for input(s): "CHOL", "HDL", "LDLCALC", "TRIG", "CHOLHDL", "LDLDIRECT" in the last 72 hours.  Lab Results  Component Value Date   HGBA1C 6.2 (H) 02/26/2023   ------------------------------------------------------------------------------------------------------------------ No results for input(s): "TSH", "T4TOTAL", "T3FREE", "THYROIDAB" in the last 72 hours.  Invalid input(s): "FREET3"  Cardiac Enzymes No results for input(s): "CKMB", "TROPONINI", "MYOGLOBIN" in the last 168 hours.  Invalid input(s): "CK" ------------------------------------------------------------------------------------------------------------------    Component Value Date/Time   BNP 1,075.7 (H) 11/15/2015 0853    CBG: Recent Labs  Lab 02/27/23 1131 02/27/23 1642 02/27/23 2056 02/28/23 0726 02/28/23 1124  GLUCAP 124* 112* 104* 99 95     No results found for this or any previous visit (from the past 240 hour(s)).   Radiology Studies: DG CHEST PORT 1 VIEW  Result Date: 02/28/2023 CLINICAL DATA:  Productive cough for 2 days EXAM: PORTABLE CHEST 1 VIEW COMPARISON:  01/29/2023 FINDINGS: Dialysis catheter is now seen on the right. Cardiac shadow is stable. Postsurgical changes are noted. Lungs are clear bilaterally.  No bony abnormality is seen. IMPRESSION: No acute abnormality noted. Electronically Signed   By: Alcide Clever M.D.   On: 02/28/2023 09:50     Jaylynn Siefert Elvera Lennox,  MD, PhD [facility]  Between 7 am - 7 pm I am available, please contact me via Amion (for emergencies) or Securechat (non urgent messages)  Between 7 pm - 7 am I am not available, please contact night coverage MD/APP via Amion

## 2023-02-28 NOTE — TOC Progression Note (Addendum)
Transition of Care [facility]) - Progression Note    Patient Details  Name: [name] MRN: [id number] Date of Birth: September 30, 1960  Transition of Care [facility]) CM/SW Contact  Lorri Frederick, LCSW Phone Number: 02/28/2023, 10:06 AM  Clinical Narrative:   CSW met with pt regarding DC plan.  Pt continues to express unhappiness with [facility].  CSW discussed PT recommendation for HH---pt lives alone but does have supportive girlfriend who could assist there.  He would like to DC home if this is an option.  Pt reports he is attending the same HD clinic at SNF that he uses at home.  1130: [facility].  CSW requested SNF auth, facility choice pending.   Junious Dresser called back several minutes later.  Pt has existing auth, last day is today, cannot start new one with existing one in place.  Per Junious Dresser, can transfer this auth to new facility.    1145: pt met with son and girlfriend and is now agreeable to SNF, but he does not want to return to [place].    1200: Pt has offers at [facility], [place] and [place].  [facility] cannot provide HD transport, per Kia.  CSW spoke with pt, son Arlys John, girlfriend and they want to accept offer at [facility].  CSW confirmed with [facility] that she can receive pt today.  MD informed.   1315: [facility].  Provided new facility: [facility].  Auth number: [id number].  Expected Discharge Plan: Skilled Nursing Facility Barriers to Discharge: SNF Pending bed offer, Insurance Authorization  Expected Discharge Plan and Services                                               Social Determinants of Health (SDOH) Interventions SDOH Screenings   Food Insecurity: No Food Insecurity (02/26/2023)  Housing: Low Risk  (02/26/2023)  Transportation Needs: No Transportation Needs (02/26/2023)  Utilities: Not At Risk (02/26/2023)  Alcohol Screen: Low Risk  (11/11/2022)  Depression (PHQ2-9): Low Risk  (12/09/2022)  Financial Resource Strain: Low Risk  (12/09/2022)  Physical Activity:  Inactive (12/09/2022)  Social Connections: Socially Isolated (11/11/2022)  Stress: No Stress Concern Present (12/09/2022)  Tobacco Use: Low Risk  (02/25/2023)    Readmission Risk Interventions     No data to display

## 2023-03-01 DIAGNOSIS — I10 Essential (primary) hypertension: Secondary | ICD-10-CM | POA: Diagnosis not present

## 2023-03-01 DIAGNOSIS — E119 Type 2 diabetes mellitus without complications: Secondary | ICD-10-CM | POA: Diagnosis not present

## 2023-03-01 DIAGNOSIS — M6281 Muscle weakness (generalized): Secondary | ICD-10-CM | POA: Diagnosis not present

## 2023-03-01 DIAGNOSIS — E785 Hyperlipidemia, unspecified: Secondary | ICD-10-CM | POA: Diagnosis not present

## 2023-03-01 DIAGNOSIS — N170 Acute kidney failure with tubular necrosis: Secondary | ICD-10-CM | POA: Diagnosis not present

## 2023-03-01 DIAGNOSIS — I4891 Unspecified atrial fibrillation: Secondary | ICD-10-CM | POA: Diagnosis not present

## 2023-03-01 DIAGNOSIS — D631 Anemia in chronic kidney disease: Secondary | ICD-10-CM | POA: Diagnosis not present

## 2023-03-01 DIAGNOSIS — Z992 Dependence on renal dialysis: Secondary | ICD-10-CM | POA: Diagnosis not present

## 2023-03-01 DIAGNOSIS — D649 Anemia, unspecified: Secondary | ICD-10-CM | POA: Diagnosis not present

## 2023-03-01 DIAGNOSIS — I2510 Atherosclerotic heart disease of native coronary artery without angina pectoris: Secondary | ICD-10-CM | POA: Diagnosis not present

## 2023-03-01 DIAGNOSIS — R7881 Bacteremia: Secondary | ICD-10-CM | POA: Diagnosis not present

## 2023-03-01 DIAGNOSIS — T8249XD Other complication of vascular dialysis catheter, subsequent encounter: Secondary | ICD-10-CM | POA: Diagnosis not present

## 2023-03-01 DIAGNOSIS — N186 End stage renal disease: Secondary | ICD-10-CM | POA: Diagnosis not present

## 2023-03-01 DIAGNOSIS — R2689 Other abnormalities of gait and mobility: Secondary | ICD-10-CM | POA: Diagnosis not present

## 2023-03-02 DIAGNOSIS — R7881 Bacteremia: Secondary | ICD-10-CM | POA: Diagnosis not present

## 2023-03-04 DIAGNOSIS — M6281 Muscle weakness (generalized): Secondary | ICD-10-CM | POA: Diagnosis not present

## 2023-03-04 DIAGNOSIS — I4891 Unspecified atrial fibrillation: Secondary | ICD-10-CM | POA: Diagnosis not present

## 2023-03-04 DIAGNOSIS — R2689 Other abnormalities of gait and mobility: Secondary | ICD-10-CM | POA: Diagnosis not present

## 2023-03-04 DIAGNOSIS — Z992 Dependence on renal dialysis: Secondary | ICD-10-CM | POA: Diagnosis not present

## 2023-03-04 DIAGNOSIS — N186 End stage renal disease: Secondary | ICD-10-CM | POA: Diagnosis not present

## 2023-03-04 DIAGNOSIS — D649 Anemia, unspecified: Secondary | ICD-10-CM | POA: Diagnosis not present

## 2023-03-07 DIAGNOSIS — M79669 Pain in unspecified lower leg: Secondary | ICD-10-CM | POA: Diagnosis not present

## 2023-03-08 DIAGNOSIS — Z992 Dependence on renal dialysis: Secondary | ICD-10-CM | POA: Diagnosis not present

## 2023-03-08 DIAGNOSIS — N170 Acute kidney failure with tubular necrosis: Secondary | ICD-10-CM | POA: Diagnosis not present

## 2023-03-08 DIAGNOSIS — R7881 Bacteremia: Secondary | ICD-10-CM | POA: Diagnosis not present

## 2023-03-08 DIAGNOSIS — T8249XD Other complication of vascular dialysis catheter, subsequent encounter: Secondary | ICD-10-CM | POA: Diagnosis not present

## 2023-03-08 DIAGNOSIS — D631 Anemia in chronic kidney disease: Secondary | ICD-10-CM | POA: Diagnosis not present

## 2023-03-09 DIAGNOSIS — M6281 Muscle weakness (generalized): Secondary | ICD-10-CM | POA: Diagnosis not present

## 2023-03-09 DIAGNOSIS — R2689 Other abnormalities of gait and mobility: Secondary | ICD-10-CM | POA: Diagnosis not present

## 2023-03-09 DIAGNOSIS — N186 End stage renal disease: Secondary | ICD-10-CM | POA: Diagnosis not present

## 2023-03-09 DIAGNOSIS — Z992 Dependence on renal dialysis: Secondary | ICD-10-CM | POA: Diagnosis not present

## 2023-03-09 DIAGNOSIS — D649 Anemia, unspecified: Secondary | ICD-10-CM | POA: Diagnosis not present

## 2023-03-09 DIAGNOSIS — I4891 Unspecified atrial fibrillation: Secondary | ICD-10-CM | POA: Diagnosis not present

## 2023-03-11 DIAGNOSIS — M79669 Pain in unspecified lower leg: Secondary | ICD-10-CM | POA: Diagnosis not present

## 2023-03-11 DIAGNOSIS — I10 Essential (primary) hypertension: Secondary | ICD-10-CM | POA: Diagnosis not present

## 2023-03-12 ENCOUNTER — Encounter (HOSPITAL_COMMUNITY): Payer: Self-pay

## 2023-03-12 ENCOUNTER — Emergency Department (HOSPITAL_COMMUNITY)
Admission: EM | Admit: 2023-03-12 | Discharge: 2023-03-12 | Disposition: A | Discharge status: Home / Self Care | Payer: PPO | Attending: Emergency Medicine | Admitting: Emergency Medicine

## 2023-03-12 ENCOUNTER — Other Ambulatory Visit: Payer: Self-pay

## 2023-03-12 DIAGNOSIS — D649 Anemia, unspecified: Secondary | ICD-10-CM | POA: Diagnosis not present

## 2023-03-12 DIAGNOSIS — E1122 Type 2 diabetes mellitus with diabetic chronic kidney disease: Secondary | ICD-10-CM | POA: Insufficient documentation

## 2023-03-12 DIAGNOSIS — Z79899 Other long term (current) drug therapy: Secondary | ICD-10-CM | POA: Diagnosis not present

## 2023-03-12 DIAGNOSIS — Z8673 Personal history of transient ischemic attack (TIA), and cerebral infarction without residual deficits: Secondary | ICD-10-CM | POA: Diagnosis not present

## 2023-03-12 DIAGNOSIS — R531 Weakness: Secondary | ICD-10-CM | POA: Diagnosis not present

## 2023-03-12 DIAGNOSIS — I10 Essential (primary) hypertension: Secondary | ICD-10-CM | POA: Diagnosis not present

## 2023-03-12 DIAGNOSIS — Z992 Dependence on renal dialysis: Secondary | ICD-10-CM | POA: Insufficient documentation

## 2023-03-12 DIAGNOSIS — N186 End stage renal disease: Secondary | ICD-10-CM | POA: Insufficient documentation

## 2023-03-12 DIAGNOSIS — I120 Hypertensive chronic kidney disease with stage 5 chronic kidney disease or end stage renal disease: Secondary | ICD-10-CM | POA: Insufficient documentation

## 2023-03-12 DIAGNOSIS — R799 Abnormal finding of blood chemistry, unspecified: Secondary | ICD-10-CM | POA: Diagnosis present

## 2023-03-12 LAB — COMPREHENSIVE METABOLIC PANEL
ALT: 5 U/L (ref 0–44)
AST: 13 U/L — ABNORMAL LOW (ref 15–41)
Albumin: 2 g/dL — ABNORMAL LOW (ref 3.5–5.0)
Alkaline Phosphatase: 93 U/L (ref 38–126)
Anion gap: 12 (ref 5–15)
BUN: 19 mg/dL (ref 8–23)
CO2: 27 mmol/L (ref 22–32)
Calcium: 8.4 mg/dL — ABNORMAL LOW (ref 8.9–10.3)
Chloride: 96 mmol/L — ABNORMAL LOW (ref 98–111)
Creatinine, Ser: 3.03 mg/dL — ABNORMAL HIGH (ref 0.61–1.24)
GFR, Estimated: 22 mL/min — ABNORMAL LOW (ref >60)
Glucose, Bld: 127 mg/dL — ABNORMAL HIGH (ref 70–99)
Potassium: 3.7 mmol/L (ref 3.5–5.1)
Sodium: 135 mmol/L (ref 135–145)
Total Bilirubin: 0.6 mg/dL (ref 0.3–1.2)
Total Protein: 8.7 g/dL — ABNORMAL HIGH (ref 6.5–8.1)

## 2023-03-12 LAB — BPAM RBC

## 2023-03-12 LAB — CBC
HCT: 21.1 % — ABNORMAL LOW (ref 39.0–52.0)
Hemoglobin: 6.4 g/dL — CL (ref 13.0–17.0)
MCH: 29.9 pg (ref 26.0–34.0)
MCHC: 30.3 g/dL (ref 30.0–36.0)
MCV: 98.6 fL (ref 80.0–100.0)
Platelets: 166 10*3/uL (ref 150–400)
RBC: 2.14 MIL/uL — ABNORMAL LOW (ref 4.22–5.81)
RDW: 25.1 % — ABNORMAL HIGH (ref 11.5–15.5)
WBC: 10.5 10*3/uL (ref 4.0–10.5)
nRBC: 0 % (ref 0.0–0.2)

## 2023-03-12 LAB — TYPE AND SCREEN: Unit division: 0

## 2023-03-12 LAB — PREPARE RBC (CROSSMATCH)

## 2023-03-12 MED ORDER — SODIUM CHLORIDE 0.9% IV SOLUTION: Freq: Once | INTRAVENOUS | Status: AC

## 2023-03-12 NOTE — ED Notes (Signed)
Hgb 6.4. Sent message to charge

## 2023-03-12 NOTE — ED Notes (Signed)
Patient assisted to [facility].

## 2023-03-12 NOTE — ED Provider Triage Note (Signed)
Emergency Medicine Provider Triage Evaluation Note  [name] , a 62 y.o. male  was evaluated in triage.  Pt complains of normal lab result.  Review of Systems  Positive: (No complaints) Negative: Dizziness, fatigue, shortness of breath, blood in stool, nausea  Physical Exam  BP 113/63 (BP Location: Left Arm)   Pulse 65   Temp 99.1 F (37.3 C) (Oral)   Resp 17   Ht 5\' 6"  (1.676 m)   Wt 98 kg   SpO2 98%   BMI 34.87 kg/m  Gen:   Awake, no distress   Resp:  Normal effort  MSK:   Moves extremities without difficulty   Medical Decision Making  Medically screening exam initiated at 1:41 PM.  Appropriate orders placed.  [name] was informed that the remainder of the evaluation will be completed by another provider, this initial triage assessment does not replace that evaluation, and the importance of remaining in the ED until their evaluation is complete.  Patient presenting for incidental finding of acute on chronic anemia.  He undergoes dialysis on Tuesday, Thursday, Saturday.  He underwent a full session today.  During his dialysis session, they did check lab work.  Patient was informed that his hemoglobin was low.  He believes that it was in the range of 6.  He reports a history of blood transfusions.  Last 1 was 2 to 3 weeks ago.  He denies any known sources of blood loss.  Patient has no new recent symptoms.   Dwayne Manchester, MD 03/12/23 [phone]

## 2023-03-12 NOTE — ED Triage Notes (Addendum)
Pt bib [facility] from [facility] where he had dialysis today and was notified that his hemoglobin was low. Pt arrives with no complaints. Denies bloody stool, bloody urine, n/v/d. AOx4. Ambulatory with walker. Resp E/U

## 2023-03-12 NOTE — ED Notes (Signed)
Pt provided a sandwich and gingerale.

## 2023-03-12 NOTE — ED Notes (Signed)
Attempted to call [facility] with no response.

## 2023-03-12 NOTE — Discharge Instructions (Addendum)
Follow up closely with your Primary Care Doctor and Kidney Doctor.

## 2023-03-12 NOTE — ED Provider Notes (Signed)
[place] EMERGENCY DEPARTMENT AT [facility] Provider Note   CSN: [id number] Arrival date & time: 03/12/23  1321     History  Chief Complaint  Patient presents with   abnormal labs    [name] is a 62 y.o. male.  HPI 62 year old male with a complicated past medical history which includes diabetes, ESRD on dialysis, hypertension, heart murmur, TIA, TTP, and PEA arrest a couple months ago presents with anemia.  Patient states he has no acute symptoms but was sent over from dialysis.  He had a full dialysis session today but was told his hemoglobin was in the 6 range and he needed to come here.  He denies any acute blood loss including no nosebleed (last time he was admitted with epistaxis causing anemia), and no hematochezia or melena.  He denies symptoms of anemia including no fatigue, dizziness, chest pain, shortness of breath. He required a blood transfusion a couple weeks ago.  Home Medications Prior to Admission medications   Medication Sig Start Date End Date Taking? Authorizing Provider  acetaminophen (TYLENOL) 325 MG tablet Take 650 mg by mouth every 4 (four) hours as needed for mild pain.    [provider]  acetaminophen (TYLENOL) 500 MG tablet Take 1,000 mg by mouth every 8 (eight) hours.    [provider]  amLODipine (NORVASC) 10 MG tablet Take 1 tablet by mouth once daily Patient not taking: Reported on 02/26/2023 02/01/23   Marcine Matar, MD  atorvastatin (LIPITOR) 80 MG tablet TAKE 1 TABLET BY MOUTH ONCE DAILY AT 6 IN THE EVENING Patient taking differently: Take 80 mg by mouth at bedtime. 11/11/22   Marcine Matar, MD  carvedilol (COREG) 6.25 MG tablet Take 1 tablet (6.25 mg total) by mouth 2 (two) times daily. 01/11/23   Gaston Islam., NP  colchicine 0.6 MG tablet Take 1 tablet (0.6 mg total) by mouth 2 (two) times daily as needed. Patient not taking: Reported on 02/26/2023 12/16/22   Tarry Kos, MD  ezetimibe (ZETIA) 10 MG  tablet Take 1 tablet (10 mg total) by mouth daily. 11/11/22   Marcine Matar, MD  FARXIGA 10 MG TABS tablet Take 10 mg by mouth daily. Patient not taking: Reported on 02/26/2023 02/04/22   [provider]  fluticasone Aleda Grana) 50 MCG/ACT nasal spray Use 2 spray(s) in each nostril once daily Patient not taking: Reported on 02/26/2023 10/26/22   Marcine Matar, MD  furosemide (LASIX) 40 MG tablet 1 tablet p.o. q. Mondays, Wednesday and Fridays. Patient not taking: Reported on 02/26/2023 12/13/22   Marcine Matar, MD  gabapentin (NEURONTIN) 100 MG capsule Take 100 mg by mouth at bedtime.    [provider]  ipratropium-albuterol (DUONEB) 0.5-2.5 (3) MG/3ML SOLN Take 3 mLs by nebulization every 6 (six) hours as needed (for wheezing).    [provider]  lidocaine (LIDOCAINE PAIN RELIEF) 4 % Place 1 patch onto the skin daily.    [provider]  lisinopril (ZESTRIL) 40 MG tablet Take 1 tablet (40 mg total) by mouth daily. Patient not taking: Reported on 02/26/2023 08/12/22   Marcine Matar, MD  neomycin-polymyxin b-dexamethasone (MAXITROL) 3.5-10000-0.1 OINT Place 1 Application into the right eye in the morning and at bedtime. for 10 days starting 0618/2024    [provider]  polyvinyl alcohol (ARTIFICIAL TEARS) 1.4 % ophthalmic solution Place 1 drop into both eyes every 6 (six) hours as needed for dry eyes.  [provider]  potassium chloride SA (KLOR-CON M) 20 MEQ tablet TAKE 1 TABLET BY MOUTH ON MONDAY, Wed, FRIDAY and Saturdays. Patient not taking: Reported on 02/26/2023 12/13/22   Marcine Matar, MD  rifampin (RIFADIN) 300 MG capsule Take 300 mg by mouth every 8 (eight) hours. for endocarditis until 03/14/23 1159    [provider]  sodium bicarbonate 650 MG tablet Take 1,300 mg by mouth 3 (three) times daily.    [provider]      Allergies    Other, Plavix [clopidogrel bisulfate], Fleet enema [enema], Lovenox  [enoxaparin], Morphine, Nsaids, and Hydrocodone-acetaminophen    Review of Systems   Review of Systems  Constitutional:  Negative for fatigue.  HENT:  Negative for nosebleeds.   Respiratory:  Negative for shortness of breath.   Cardiovascular:  Positive for leg swelling (chronic). Negative for chest pain.  Gastrointestinal:  Negative for blood in stool.  Neurological:  Negative for dizziness and weakness.    Physical Exam Updated Vital Signs BP 113/63   Pulse 86   Temp 99.1 F (37.3 C) (Oral)   Resp 15   Ht 5\' 6"  (1.676 m)   Wt 98 kg   SpO2 99%   BMI 34.87 kg/m  Physical Exam Vitals and nursing note reviewed.  Constitutional:      General: He is not in acute distress.    Appearance: He is well-developed. He is not ill-appearing or diaphoretic.  HENT:     Head: Normocephalic and atraumatic.  Cardiovascular:     Rate and Rhythm: Normal rate and regular rhythm.     Heart sounds: Murmur heard.  Pulmonary:     Effort: Pulmonary effort is normal.     Breath sounds: Normal breath sounds.  Abdominal:     Palpations: Abdomen is soft.     Tenderness: There is no abdominal tenderness.  Musculoskeletal:     Right lower leg: Edema present.     Left lower leg: Edema present.  Skin:    General: Skin is warm and dry.  Neurological:     Mental Status: He is alert.    ED Results / Procedures / Treatments   Labs (all labs ordered are listed, but only abnormal results are displayed) Labs Reviewed  COMPREHENSIVE METABOLIC PANEL - Abnormal; Notable for the following components:      Result Value   Chloride 96 (*)    Glucose, Bld 127 (*)    Creatinine, Ser 3.03 (*)    Calcium 8.4 (*)    Total Protein 8.7 (*)    Albumin 2.0 (*)    AST 13 (*)    GFR, Estimated 22 (*)    All other components within normal limits  CBC - Abnormal; Notable for the following components:   RBC 2.14 (*)    Hemoglobin 6.4 (*)    HCT 21.1 (*)    RDW 25.1 (*)    All other components within normal  limits  PROTIME-INR  TYPE AND SCREEN  PREPARE RBC (CROSSMATCH)    EKG EKG Interpretation Date/Time:  Saturday March 12 2023 16:50:11 EDT Ventricular Rate:  85 PR Interval:    QRS Duration:  126 QT Interval:  452 QTC Calculation: 538 R Axis:   44  Text Interpretation: Atrial flutter with predominant 3:1 AV block Right bundle branch block Confirmed by Pricilla Loveless [id number]) on 03/12/2023 5:01:14 PM  Radiology No results found.  Procedures .Critical Care  Performed by: Pricilla Loveless, MD Authorized by: Pricilla Loveless, MD  Critical care provider statement:    Critical care time (minutes):  30   Critical care time was exclusive of:  Separately billable procedures and treating other patients   Critical care was necessary to treat or prevent imminent or life-threatening deterioration of the following conditions:  Circulatory failure   Critical care was time spent personally by me on the following activities:  Development of treatment plan with patient or surrogate, discussions with consultants, evaluation of patient's response to treatment, examination of patient, ordering and review of laboratory studies, ordering and review of radiographic studies, ordering and performing treatments and interventions, pulse oximetry, re-evaluation of patient's condition and review of old charts     Medications Ordered in ED Medications  0.9 %  sodium chloride infusion (Manually program via Guardrails IV Fluids) (0 mLs Intravenous Stopped 03/12/23 1843)    ED Course/ Medical Decision Making/ A&P                             Medical Decision Making Amount and/or Complexity of Data Reviewed External Data Reviewed: notes. Labs: ordered.    Details: Hemoglobin 6.4, baseline in low 7s CKD ECG/medicine tests: ordered and independent interpretation performed.    Details: PVCs  Risk Prescription drug management.   Patient presents with asymptomatic anemia. Hemoglobin is 6.4, which is mildly  lower than baseline. However, because it's below 7, he needs a blood transfusion. Was given 1 unit here.  Patient complained of some chest pain during the transfusion.  However he states this is because he was sitting up in certain positions of cause chest pain ever since he had his cardiac arrest a few months ago.  This is not a new thing and went away whenever he was readjusted.  I doubt he is developing a cardiac emergency.  EKG shows some known a flutter and bigeminy but no acute ischemia.  I do not think troponins are necessary.  Otherwise, he has done well and has had no complaints in his ED stay. He denies any acute bleeding, and this is likely related to his chronic kidney disease.  Will have him follow-up closely with his nephrologist.  Will give return precautions.       Final Clinical Impression(s) / ED Diagnoses Final diagnoses:  Anemia, unspecified type    Rx / DC Orders ED Discharge Orders     None         Pricilla Loveless, MD 03/12/23 2320

## 2023-03-13 LAB — TYPE AND SCREEN
ABO/RH(D): B POS
Antibody Screen: NEGATIVE

## 2023-03-13 LAB — BPAM RBC
Blood Product Expiration Date: 202407262359
ISSUE DATE / TIME: 202407061524
Unit Type and Rh: 7300

## 2023-03-14 DIAGNOSIS — M79669 Pain in unspecified lower leg: Secondary | ICD-10-CM | POA: Diagnosis not present

## 2023-03-15 DIAGNOSIS — D649 Anemia, unspecified: Secondary | ICD-10-CM | POA: Diagnosis not present

## 2023-03-15 DIAGNOSIS — D631 Anemia in chronic kidney disease: Secondary | ICD-10-CM | POA: Diagnosis not present

## 2023-03-15 DIAGNOSIS — N186 End stage renal disease: Secondary | ICD-10-CM | POA: Diagnosis not present

## 2023-03-15 DIAGNOSIS — D689 Coagulation defect, unspecified: Secondary | ICD-10-CM | POA: Diagnosis not present

## 2023-03-15 DIAGNOSIS — N170 Acute kidney failure with tubular necrosis: Secondary | ICD-10-CM | POA: Diagnosis not present

## 2023-03-15 DIAGNOSIS — Z992 Dependence on renal dialysis: Secondary | ICD-10-CM | POA: Diagnosis not present

## 2023-03-15 DIAGNOSIS — M6281 Muscle weakness (generalized): Secondary | ICD-10-CM | POA: Diagnosis not present

## 2023-03-15 DIAGNOSIS — T8249XD Other complication of vascular dialysis catheter, subsequent encounter: Secondary | ICD-10-CM | POA: Diagnosis not present

## 2023-03-15 DIAGNOSIS — I4891 Unspecified atrial fibrillation: Secondary | ICD-10-CM | POA: Diagnosis not present

## 2023-03-15 DIAGNOSIS — R2689 Other abnormalities of gait and mobility: Secondary | ICD-10-CM | POA: Diagnosis not present

## 2023-03-16 DIAGNOSIS — N186 End stage renal disease: Secondary | ICD-10-CM | POA: Diagnosis not present

## 2023-03-16 DIAGNOSIS — T8249XA Other complication of vascular dialysis catheter, initial encounter: Secondary | ICD-10-CM | POA: Diagnosis not present

## 2023-03-16 DIAGNOSIS — Z992 Dependence on renal dialysis: Secondary | ICD-10-CM | POA: Diagnosis not present

## 2023-03-17 DIAGNOSIS — N186 End stage renal disease: Secondary | ICD-10-CM | POA: Diagnosis not present

## 2023-03-17 DIAGNOSIS — M79669 Pain in unspecified lower leg: Secondary | ICD-10-CM | POA: Diagnosis not present

## 2023-03-17 DIAGNOSIS — I482 Chronic atrial fibrillation, unspecified: Secondary | ICD-10-CM | POA: Diagnosis not present

## 2023-03-17 DIAGNOSIS — I10 Essential (primary) hypertension: Secondary | ICD-10-CM | POA: Diagnosis not present

## 2023-03-18 DIAGNOSIS — M1000 Idiopathic gout, unspecified site: Secondary | ICD-10-CM | POA: Diagnosis not present

## 2023-03-18 DIAGNOSIS — I38 Endocarditis, valve unspecified: Secondary | ICD-10-CM | POA: Diagnosis not present

## 2023-03-18 DIAGNOSIS — Z7951 Long term (current) use of inhaled steroids: Secondary | ICD-10-CM | POA: Diagnosis not present

## 2023-03-18 DIAGNOSIS — Z7984 Long term (current) use of oral hypoglycemic drugs: Secondary | ICD-10-CM | POA: Diagnosis not present

## 2023-03-18 DIAGNOSIS — A4101 Sepsis due to Methicillin susceptible Staphylococcus aureus: Secondary | ICD-10-CM | POA: Diagnosis not present

## 2023-03-18 DIAGNOSIS — Z951 Presence of aortocoronary bypass graft: Secondary | ICD-10-CM | POA: Diagnosis not present

## 2023-03-18 DIAGNOSIS — Z8673 Personal history of transient ischemic attack (TIA), and cerebral infarction without residual deficits: Secondary | ICD-10-CM | POA: Diagnosis not present

## 2023-03-18 DIAGNOSIS — I252 Old myocardial infarction: Secondary | ICD-10-CM | POA: Diagnosis not present

## 2023-03-18 DIAGNOSIS — G4733 Obstructive sleep apnea (adult) (pediatric): Secondary | ICD-10-CM | POA: Diagnosis not present

## 2023-03-18 DIAGNOSIS — I2510 Atherosclerotic heart disease of native coronary artery without angina pectoris: Secondary | ICD-10-CM | POA: Diagnosis not present

## 2023-03-18 DIAGNOSIS — Z9181 History of falling: Secondary | ICD-10-CM | POA: Diagnosis not present

## 2023-03-18 DIAGNOSIS — I82C12 Acute embolism and thrombosis of left internal jugular vein: Secondary | ICD-10-CM | POA: Diagnosis not present

## 2023-03-18 DIAGNOSIS — Z992 Dependence on renal dialysis: Secondary | ICD-10-CM | POA: Diagnosis not present

## 2023-03-18 DIAGNOSIS — N186 End stage renal disease: Secondary | ICD-10-CM | POA: Diagnosis not present

## 2023-03-18 DIAGNOSIS — E7800 Pure hypercholesterolemia, unspecified: Secondary | ICD-10-CM | POA: Diagnosis not present

## 2023-03-18 DIAGNOSIS — E1122 Type 2 diabetes mellitus with diabetic chronic kidney disease: Secondary | ICD-10-CM | POA: Diagnosis not present

## 2023-03-18 DIAGNOSIS — I4891 Unspecified atrial fibrillation: Secondary | ICD-10-CM | POA: Diagnosis not present

## 2023-03-18 DIAGNOSIS — I120 Hypertensive chronic kidney disease with stage 5 chronic kidney disease or end stage renal disease: Secondary | ICD-10-CM | POA: Diagnosis not present

## 2023-03-18 DIAGNOSIS — R41841 Cognitive communication deficit: Secondary | ICD-10-CM | POA: Diagnosis not present

## 2023-03-18 DIAGNOSIS — T826XXD Infection and inflammatory reaction due to cardiac valve prosthesis, subsequent encounter: Secondary | ICD-10-CM | POA: Diagnosis not present

## 2023-03-18 DIAGNOSIS — K219 Gastro-esophageal reflux disease without esophagitis: Secondary | ICD-10-CM | POA: Diagnosis not present

## 2023-03-18 DIAGNOSIS — D631 Anemia in chronic kidney disease: Secondary | ICD-10-CM | POA: Diagnosis not present

## 2023-03-22 DIAGNOSIS — N179 Acute kidney failure, unspecified: Secondary | ICD-10-CM | POA: Diagnosis not present

## 2023-03-22 DIAGNOSIS — N170 Acute kidney failure with tubular necrosis: Secondary | ICD-10-CM | POA: Diagnosis not present

## 2023-03-22 DIAGNOSIS — D631 Anemia in chronic kidney disease: Secondary | ICD-10-CM | POA: Diagnosis not present

## 2023-03-22 DIAGNOSIS — Z992 Dependence on renal dialysis: Secondary | ICD-10-CM | POA: Diagnosis not present

## 2023-03-22 DIAGNOSIS — T8249XD Other complication of vascular dialysis catheter, subsequent encounter: Secondary | ICD-10-CM | POA: Diagnosis not present

## 2023-03-25 ENCOUNTER — Encounter (HOSPITAL_COMMUNITY): Payer: Self-pay

## 2023-03-25 ENCOUNTER — Emergency Department (HOSPITAL_COMMUNITY)
Admission: EM | Admit: 2023-03-25 | Discharge: 2023-03-25 | Disposition: A | Discharge status: Home / Self Care | Payer: PPO | Attending: Emergency Medicine | Admitting: Emergency Medicine

## 2023-03-25 ENCOUNTER — Other Ambulatory Visit: Payer: Self-pay

## 2023-03-25 DIAGNOSIS — Z79899 Other long term (current) drug therapy: Secondary | ICD-10-CM | POA: Diagnosis not present

## 2023-03-25 DIAGNOSIS — I120 Hypertensive chronic kidney disease with stage 5 chronic kidney disease or end stage renal disease: Secondary | ICD-10-CM | POA: Insufficient documentation

## 2023-03-25 DIAGNOSIS — D649 Anemia, unspecified: Secondary | ICD-10-CM | POA: Insufficient documentation

## 2023-03-25 DIAGNOSIS — E1122 Type 2 diabetes mellitus with diabetic chronic kidney disease: Secondary | ICD-10-CM | POA: Diagnosis not present

## 2023-03-25 DIAGNOSIS — D631 Anemia in chronic kidney disease: Secondary | ICD-10-CM | POA: Diagnosis not present

## 2023-03-25 DIAGNOSIS — Z992 Dependence on renal dialysis: Secondary | ICD-10-CM | POA: Diagnosis not present

## 2023-03-25 DIAGNOSIS — Z7984 Long term (current) use of oral hypoglycemic drugs: Secondary | ICD-10-CM | POA: Insufficient documentation

## 2023-03-25 DIAGNOSIS — N186 End stage renal disease: Secondary | ICD-10-CM | POA: Diagnosis not present

## 2023-03-25 LAB — CBC WITH DIFFERENTIAL/PLATELET
Abs Immature Granulocytes: 0.08 10*3/uL — ABNORMAL HIGH (ref 0.00–0.07)
Basophils Absolute: 0 10*3/uL (ref 0.0–0.1)
Basophils Relative: 0 %
Eosinophils Absolute: 0 10*3/uL (ref 0.0–0.5)
Eosinophils Relative: 0 %
HCT: 19.6 % — ABNORMAL LOW (ref 39.0–52.0)
Hemoglobin: 6 g/dL — CL (ref 13.0–17.0)
Immature Granulocytes: 1 %
Lymphocytes Relative: 20 %
Lymphs Abs: 1.9 10*3/uL (ref 0.7–4.0)
MCH: 30.2 pg (ref 26.0–34.0)
MCHC: 30.6 g/dL (ref 30.0–36.0)
MCV: 98.5 fL (ref 80.0–100.0)
Monocytes Absolute: 3.9 10*3/uL — ABNORMAL HIGH (ref 0.1–1.0)
Monocytes Relative: 40 %
Neutro Abs: 3.7 10*3/uL (ref 1.7–7.7)
Neutrophils Relative %: 39 %
Platelets: 158 10*3/uL (ref 150–400)
RBC: 1.99 MIL/uL — ABNORMAL LOW (ref 4.22–5.81)
RDW: 25.6 % — ABNORMAL HIGH (ref 11.5–15.5)
WBC: 9.6 10*3/uL (ref 4.0–10.5)
nRBC: 0 % (ref 0.0–0.2)

## 2023-03-25 LAB — COMPREHENSIVE METABOLIC PANEL
ALT: 7 U/L (ref 0–44)
AST: 18 U/L (ref 15–41)
Albumin: 2.4 g/dL — ABNORMAL LOW (ref 3.5–5.0)
Alkaline Phosphatase: 68 U/L (ref 38–126)
Anion gap: 11 (ref 5–15)
BUN: 19 mg/dL (ref 8–23)
CO2: 26 mmol/L (ref 22–32)
Calcium: 8.6 mg/dL — ABNORMAL LOW (ref 8.9–10.3)
Chloride: 99 mmol/L (ref 98–111)
Creatinine, Ser: 2.48 mg/dL — ABNORMAL HIGH (ref 0.61–1.24)
GFR, Estimated: 28 mL/min — ABNORMAL LOW (ref >60)
Glucose, Bld: 128 mg/dL — ABNORMAL HIGH (ref 70–99)
Potassium: 3.7 mmol/L (ref 3.5–5.1)
Sodium: 136 mmol/L (ref 135–145)
Total Bilirubin: 0.8 mg/dL (ref 0.3–1.2)
Total Protein: 8.3 g/dL — ABNORMAL HIGH (ref 6.5–8.1)

## 2023-03-25 LAB — BPAM RBC
ISSUE DATE / TIME: 202407191612
Unit Type and Rh: 7300

## 2023-03-25 LAB — PREPARE RBC (CROSSMATCH)

## 2023-03-25 LAB — TYPE AND SCREEN: Antibody Screen: NEGATIVE

## 2023-03-25 LAB — APTT: aPTT: 38 seconds — ABNORMAL HIGH (ref 24–36)

## 2023-03-25 LAB — PROTIME-INR
INR: 1.5 — ABNORMAL HIGH (ref 0.8–1.2)
Prothrombin Time: 18.5 seconds — ABNORMAL HIGH (ref 11.4–15.2)

## 2023-03-25 MED ORDER — SODIUM CHLORIDE 0.9% IV SOLUTION: Freq: Once | INTRAVENOUS | Status: DC

## 2023-03-25 NOTE — ED Provider Triage Note (Signed)
Emergency Medicine Provider Triage Evaluation Note  [name] , a 62 y.o. male  was evaluated in triage.  Pt complains of low Hbg.  Review of Systems  Positive:  Negative:   Physical Exam  BP 103/60 (BP Location: Right Arm)   Pulse 71   Temp 98.2 Jones (36.8 C) (Oral)   Resp 18   Ht 5\' 6"  (1.676 m)   Wt 98 kg   SpO2 97%   BMI 34.86 kg/m  Gen:   Awake, no distress   Resp:  Normal effort  MSK:   Moves extremities without difficulty  Other:    Medical Decision Making  Medically screening exam initiated at 12:49 PM.  Appropriate orders placed.  [name] was informed that the remainder of the evaluation will be completed by another provider, this initial triage assessment does not replace that evaluation, and the importance of remaining in the ED until their evaluation is complete.  Past history of 3 blood transfusions. Also on dialysis Tue/Thurs/Sat and is complaint. Patient states that he does not know why he loses blood. Hemoccult 6 months ago was without blood per patient. Dialysis took blood yesterday and resulted low today so they referred patient to ED for blood transfusion.  Denies fever, chest pain, dyspnea, abdominal pain, nausea, vomiting, hematochezia, hematuria, weakness.   [name], [place] 03/25/23 1254

## 2023-03-25 NOTE — Discharge Instructions (Addendum)
Thank you for allowing [place] to be part of your care today.  You were evaluated in the ED for anemia (low blood count).  You were given 1 unit of red blood cells to help with your anemia.  Please continue to go to dialysis as scheduled.  Due to the requiring multiple blood transfusions this year, I recommend asking your kidney doctor about erythropoietin infusions to help your body make new blood cells.  Return to the ED if you develop sudden worsening of your symptoms or if you have any new concerns.

## 2023-03-25 NOTE — ED Provider Notes (Signed)
[place] EMERGENCY DEPARTMENT AT [facility] Provider Note   CSN: [id number] Arrival date & time: 03/25/23  1148     History {Add pertinent medical, surgical, social history, OB history to HPI:1} No chief complaint on file.   [name] is a 62 y.o. male with past medical history significant for ESRD on HD, hypertension, diabetes, heart murmur, TIA, TTP, and PEA arrest presents to the ED with anemia.  Patient had dialysis yesterday and was told his hemoglobin was low today.  Denies weakness, dizziness, light-headedness, melena, hematochezia, hematuria, fatigue, chest pain, shortness of breath.  Patient is followed by [facility] and does dialysis on T/Th/Sat.  He has been compliant with dialysis and all medications.  He does not receive epoetin infusions.         Home Medications Prior to Admission medications   Medication Sig Start Date End Date Taking? Authorizing Provider  acetaminophen (TYLENOL) 325 MG tablet Take 650 mg by mouth every 4 (four) hours as needed for mild pain.    [provider]  acetaminophen (TYLENOL) 500 MG tablet Take 1,000 mg by mouth every 8 (eight) hours.    [provider]  amLODipine (NORVASC) 10 MG tablet Take 1 tablet by mouth once daily Patient not taking: Reported on 02/26/2023 02/01/23   Marcine Matar, MD  atorvastatin (LIPITOR) 80 MG tablet TAKE 1 TABLET BY MOUTH ONCE DAILY AT 6 IN THE EVENING Patient taking differently: Take 80 mg by mouth at bedtime. 11/11/22   Marcine Matar, MD  carvedilol (COREG) 6.25 MG tablet Take 1 tablet (6.25 mg total) by mouth 2 (two) times daily. 01/11/23   Gaston Islam., NP  colchicine 0.6 MG tablet Take 1 tablet (0.6 mg total) by mouth 2 (two) times daily as needed. Patient not taking: Reported on 02/26/2023 12/16/22   Tarry Kos, MD  ezetimibe (ZETIA) 10 MG tablet Take 1 tablet (10 mg total) by mouth daily. 11/11/22   Marcine Matar, MD  FARXIGA 10 MG TABS tablet Take 10  mg by mouth daily. Patient not taking: Reported on 02/26/2023 02/04/22   [provider]  fluticasone Aleda Grana) 50 MCG/ACT nasal spray Use 2 spray(s) in each nostril once daily Patient not taking: Reported on 02/26/2023 10/26/22   Marcine Matar, MD  furosemide (LASIX) 40 MG tablet 1 tablet p.o. q. Mondays, Wednesday and Fridays. Patient not taking: Reported on 02/26/2023 12/13/22   Marcine Matar, MD  gabapentin (NEURONTIN) 100 MG capsule Take 100 mg by mouth at bedtime.    [provider]  ipratropium-albuterol (DUONEB) 0.5-2.5 (3) MG/3ML SOLN Take 3 mLs by nebulization every 6 (six) hours as needed (for wheezing).    [provider]  lidocaine (LIDOCAINE PAIN RELIEF) 4 % Place 1 patch onto the skin daily.    [provider]  lisinopril (ZESTRIL) 40 MG tablet Take 1 tablet (40 mg total) by mouth daily. Patient not taking: Reported on 02/26/2023 08/12/22   Marcine Matar, MD  neomycin-polymyxin b-dexamethasone (MAXITROL) 3.5-10000-0.1 OINT Place 1 Application into the right eye in the morning and at bedtime. for 10 days starting 0618/2024    [provider]  polyvinyl alcohol (ARTIFICIAL TEARS) 1.4 % ophthalmic solution Place 1 drop into both eyes every 6 (six) hours as needed for dry eyes.    [provider]  potassium chloride SA (KLOR-CON M) 20 MEQ tablet TAKE 1 TABLET BY MOUTH ON MONDAY, Wed, FRIDAY and Saturdays. Patient not taking: Reported  on 02/26/2023 12/13/22   Marcine Matar, MD  rifampin (RIFADIN) 300 MG capsule Take 300 mg by mouth every 8 (eight) hours. for endocarditis until 03/14/23 1159    [provider]  sodium bicarbonate 650 MG tablet Take 1,300 mg by mouth 3 (three) times daily.    [provider]      Allergies    Other, Plavix [clopidogrel bisulfate], Fleet enema [enema], Lovenox [enoxaparin], Morphine, Nsaids, and Hydrocodone-acetaminophen    Review of Systems   Review of Systems   Constitutional:  Negative for fatigue.  Respiratory:  Negative for shortness of breath.   Cardiovascular:  Negative for chest pain.    Physical Exam Updated Vital Signs BP 103/60 (BP Location: Right Arm)   Pulse 71   Temp 98.2 F (36.8 C) (Oral)   Resp 18   Ht 5\' 6"  (1.676 m)   Wt 98 kg   SpO2 97%   BMI 34.86 kg/m  Physical Exam  ED Results / Procedures / Treatments   Labs (all labs ordered are listed, but only abnormal results are displayed) Labs Reviewed  CBC WITH DIFFERENTIAL/PLATELET - Abnormal; Notable for the following components:      Result Value   RBC 1.99 (*)    Hemoglobin 6.0 (*)    HCT 19.6 (*)    RDW 25.6 (*)    Monocytes Absolute 3.9 (*)    Abs Immature Granulocytes 0.08 (*)    All other components within normal limits  COMPREHENSIVE METABOLIC PANEL - Abnormal; Notable for the following components:   Glucose, Bld 128 (*)    Creatinine, Ser 2.48 (*)    Calcium 8.6 (*)    Total Protein 8.3 (*)    Albumin 2.4 (*)    GFR, Estimated 28 (*)    All other components within normal limits  APTT - Abnormal; Notable for the following components:   aPTT 38 (*)    All other components within normal limits  PROTIME-INR - Abnormal; Notable for the following components:   Prothrombin Time 18.5 (*)    INR 1.5 (*)    All other components within normal limits  TYPE AND SCREEN    EKG None  Radiology No results found.  Procedures Procedures  {Document cardiac monitor, telemetry assessment procedure when appropriate:1}  Medications Ordered in ED Medications - No data to display  ED Course/ Medical Decision Making/ A&P   {   Click here for ABCD2, HEART and other calculatorsREFRESH Note before signing :1}                          Medical Decision Making  ***  {Document critical care time when appropriate:1} {Document review of labs and clinical decision tools ie heart score, Chads2Vasc2 etc:1}  {Document your independent review of radiology images, and  any outside records:1} {Document your discussion with family members, caretakers, and with consultants:1} {Document social determinants of health affecting pt's care:1} {Document your decision making why or why not admission, treatments were needed:1} Final Clinical Impression(s) / ED Diagnoses Final diagnoses:  None    Rx / DC Orders ED Discharge Orders     None

## 2023-03-25 NOTE — ED Triage Notes (Addendum)
Pt to ED c/o abnormal lab, had dialysis yesterday and was told hemoglobin was low today. Pt denies dizziness, weakness. Denies blood in stool. A&O X 4 in triage.  Hx of blood transfusion

## 2023-03-26 ENCOUNTER — Other Ambulatory Visit: Payer: Self-pay | Admitting: Internal Medicine

## 2023-03-26 DIAGNOSIS — I25810 Atherosclerosis of coronary artery bypass graft(s) without angina pectoris: Secondary | ICD-10-CM

## 2023-03-26 LAB — BPAM RBC: Blood Product Expiration Date: 202408172359

## 2023-03-26 LAB — TYPE AND SCREEN
ABO/RH(D): B POS
Unit division: 0

## 2023-03-28 NOTE — Telephone Encounter (Signed)
Unable to refill per protocol, Rx expired. Discontinued 01/29/23.  Requested Prescriptions  Pending Prescriptions Disp Refills   clopidogrel (PLAVIX) 75 MG tablet [Pharmacy Med Name: Clopidogrel Bisulfate 75 MG Oral Tablet] 90 tablet 0    Sig: Take 1 tablet by mouth once daily     Hematology: Antiplatelets - clopidogrel Failed - 03/26/2023  6:31 AM      Failed - HCT in normal range and within 180 days    HCT  Date Value Ref Range Status  03/25/2023 19.6 (L) 39.0 - 52.0 % Final   Hematocrit  Date Value Ref Range Status  08/12/2022 29.2 (L) 37.5 - 51.0 % Final         Failed - HGB in normal range and within 180 days    Hemoglobin  Date Value Ref Range Status  03/25/2023 6.0 (LL) 13.0 - 17.0 g/dL Final    Comment:    REPEATED TO VERIFY THIS CRITICAL RESULT HAS VERIFIED AND BEEN CALLED TO A STRADER RN BY GLENDA GANADEN ON 07 19 2024 AT 1356, AND HAS BEEN READ BACK.    08/12/2022 10.2 (L) 13.0 - 17.7 g/dL Final         Failed - Cr in normal range and within 360 days    Creat  Date Value Ref Range Status  02/24/2015 1.05 0.50 - 1.35 mg/dL Final   Creatinine, Ser  Date Value Ref Range Status  03/25/2023 2.48 (H) 0.61 - 1.24 mg/dL Final   Creatinine, Urine  Date Value Ref Range Status  01/29/2023 313 mg/dL Final    Comment:    Performed at [facility] : [facility], [address], [place], [place] [address]         Passed - PLT in normal range and within 180 days    Platelets  Date Value Ref Range Status  03/25/2023 158 150 - 400 K/uL Final  08/12/2022 252 150 - 450 x10E3/uL Final         Passed - Valid encounter within last 6 months    Recent Outpatient Visits           3 months ago Type 2 diabetes mellitus with obesity [facility])   [place] [facility] Marcine Matar, MD   4 months ago Dyslipidemia   [facility] [place], [place] L, RPH-CPP   4 months ago Edema of both legs   [place] [facility]  [facility] Marcine Matar, MD   6 months ago Subacute cough   [place] [facility] [place], Odette Horns, MD   7 months ago Diabetes mellitus with macroalbuminuric diabetic nephropathy [facility])   [place] [facility] Marcine Matar, MD       Future Appointments             In 1 week [place], Marzella Schlein, PA-C [place]   In 1 week [place], Marzella Schlein, [place] [facility]   In 2 weeks [place], Binnie Rail, MD [facility]   In 1 month [place], Deloris Ping, MD [facility]

## 2023-03-29 DIAGNOSIS — N179 Acute kidney failure, unspecified: Secondary | ICD-10-CM | POA: Diagnosis not present

## 2023-03-29 DIAGNOSIS — D631 Anemia in chronic kidney disease: Secondary | ICD-10-CM | POA: Diagnosis not present

## 2023-03-29 DIAGNOSIS — N170 Acute kidney failure with tubular necrosis: Secondary | ICD-10-CM | POA: Diagnosis not present

## 2023-03-29 DIAGNOSIS — T8249XD Other complication of vascular dialysis catheter, subsequent encounter: Secondary | ICD-10-CM | POA: Diagnosis not present

## 2023-03-29 DIAGNOSIS — Z992 Dependence on renal dialysis: Secondary | ICD-10-CM | POA: Diagnosis not present

## 2023-03-30 ENCOUNTER — Ambulatory Visit (INDEPENDENT_AMBULATORY_CARE_PROVIDER_SITE_OTHER): Payer: PPO | Admitting: Infectious Diseases

## 2023-03-30 ENCOUNTER — Encounter: Payer: Self-pay | Admitting: Infectious Diseases

## 2023-03-30 ENCOUNTER — Other Ambulatory Visit: Payer: Self-pay

## 2023-03-30 VITALS — BP 108/75 | HR 81 | Temp 97.3°F | Wt 211.0 lb

## 2023-03-30 DIAGNOSIS — B9561 Methicillin susceptible Staphylococcus aureus infection as the cause of diseases classified elsewhere: Secondary | ICD-10-CM

## 2023-03-30 DIAGNOSIS — I079 Rheumatic tricuspid valve disease, unspecified: Secondary | ICD-10-CM | POA: Diagnosis not present

## 2023-03-30 DIAGNOSIS — I330 Acute and subacute infective endocarditis: Secondary | ICD-10-CM | POA: Diagnosis not present

## 2023-03-30 DIAGNOSIS — Z79899 Other long term (current) drug therapy: Secondary | ICD-10-CM

## 2023-03-30 NOTE — Progress Notes (Addendum)
Patient Active Problem List   Diagnosis Date Noted   Anemia 02/26/2023   Sepsis (HCC) 01/29/2023   TTP (thrombotic thrombocytopenic purpura) (HCC) 01/29/2023   MSSA bacteremia 01/29/2023   PEA (Pulseless electrical activity) (HCC) 01/29/2023   Severe sepsis with septic shock (HCC) 01/29/2023   Prosthetic valve endocarditis (HCC) 01/29/2023   Influenza vaccine refused 10/09/2020   OSA on CPAP 02/06/2019   Macroalbuminuric diabetic nephropathy (HCC) 02/06/2019   TIA (transient ischemic attack) 04/10/2018   Colon cancer screening 05/04/2017   Cerebral infarction (HCC) 12/11/2015   Acute ischemic VBA thalamic stroke (HCC)    Atherosclerosis of native coronary artery of native heart without angina pectoris    H/O heart bypass surgery    Benign essential HTN    Type 2 diabetes mellitus without complication, without long-term current use of insulin (HCC)    S/P AVR 11/25/2015   CAD (coronary artery disease)    Chronic periodontitis 11/21/2015   Bicuspid aortic valve    Controlled type 2 diabetes mellitus with diabetic nephropathy, without long-term current use of insulin (HCC)    Essential hypertension 02/24/2015   Dyslipidemia 02/24/2015   GERD (gastroesophageal reflux disease) 05/02/2013   Primary gout 05/02/2013   Allergic rhinitis due to allergen 07/09/2010    Patient's Medications  New Prescriptions   No medications on file  Previous Medications   ACETAMINOPHEN (TYLENOL) 325 MG TABLET    Take 650 mg by mouth every 4 (four) hours as needed for mild pain.   ACETAMINOPHEN (TYLENOL) 500 MG TABLET    Take 1,000 mg by mouth every 8 (eight) hours.   AMLODIPINE (NORVASC) 10 MG TABLET    Take 1 tablet by mouth once daily   ATORVASTATIN (LIPITOR) 80 MG TABLET    TAKE 1 TABLET BY MOUTH ONCE DAILY AT 6 IN THE EVENING   CARVEDILOL (COREG) 6.25 MG TABLET    Take 1 tablet (6.25 mg total) by mouth 2 (two) times daily.   COLCHICINE 0.6 MG TABLET    Take 1 tablet (0.6 mg total)  by mouth 2 (two) times daily as needed.   EZETIMIBE (ZETIA) 10 MG TABLET    Take 1 tablet (10 mg total) by mouth daily.   FARXIGA 10 MG TABS TABLET    Take 10 mg by mouth daily.   FLUTICASONE (FLONASE) 50 MCG/ACT NASAL SPRAY    Use 2 spray(s) in each nostril once daily   FUROSEMIDE (LASIX) 40 MG TABLET    1 tablet p.o. q. Mondays, Wednesday and Fridays.   GABAPENTIN (NEURONTIN) 100 MG CAPSULE    Take 100 mg by mouth at bedtime.   IPRATROPIUM-ALBUTEROL (DUONEB) 0.5-2.5 (3) MG/3ML SOLN    Take 3 mLs by nebulization every 6 (six) hours as needed (for wheezing).   LIDOCAINE (LIDOCAINE PAIN RELIEF) 4 %    Place 1 patch onto the skin daily.   LISINOPRIL (ZESTRIL) 40 MG TABLET    Take 1 tablet (40 mg total) by mouth daily.   NEOMYCIN-POLYMYXIN B-DEXAMETHASONE (MAXITROL) 3.5-10000-0.1 OINT    Place 1 Application into the right eye in the morning and at bedtime. for 10 days starting 0618/2024   POLYVINYL ALCOHOL (ARTIFICIAL TEARS) 1.4 % OPHTHALMIC SOLUTION    Place 1 drop into both eyes every 6 (six) hours as needed for dry eyes.   POTASSIUM CHLORIDE SA (KLOR-CON M) 20 MEQ TABLET    TAKE 1 TABLET BY MOUTH ON MONDAY, Wed, FRIDAY and Saturdays.  RIFAMPIN (RIFADIN) 300 MG CAPSULE    Take 300 mg by mouth every 8 (eight) hours. for endocarditis until 03/14/23 1159   SODIUM BICARBONATE 650 MG TABLET    Take 1,300 mg by mouth 3 (three) times daily.  Modified Medications   No medications on file  Discontinued Medications   No medications on file    Subjective: 62 year old male with multiple co morbidities including DM 2, HTN, HLD prior left thalamic CVA 2017, CAD status post CABG, Bicuspid AV with Aortic insufficiency status post AVR with bioprosthetic valve, CKD with recent requirement for dialysis after hospitalization in May 2024, A-fib not on [facility] due to recent diagnosis of septic embolic associated CVA who is here for follow up for ? TTP per appointment notes  Patient had a complicated hospitalization in end  of May secondary to septic shock from MSSA bacteremia, tricuspid valve endocarditis, acute renal failure requiring HD and post PEA arrest, presumed to be due to sepsis requiring vasopressors, intubation and ICU stay.  Patient was eventually extubated and weaned off of vasopressors while in the ICU.  She was transferred to [facility] on 5/25 for concerns of TTP based on peripheral smear findings.  Seen by ID and was plan to complete 6 weeks course of IV abtx, cefazolin and rifampin EOT 7/8.  Patient was also seen by hematology and TTP was ruled out with ADAMTS13 81%.  Thrombocytopenia was resolved.  Hospital course was further complicated by right cerebellar CVA with MRI noting multiple foci of infarct which was thought to be embolic CVA due to septic emboli, splenic infarct. Course further complicated by gallstone/CBD stone with early cholangitis and pancreatitis for which patient underwent ERCP with biliary sphincterotomy, balloon sweeps with 2 stones removed and plastic stent placed.  Further complications included right superficial cephalic vein thrombosis acute DVT of the left interventricular vein as well as left superficial cephalic vein.  Tested + for C diff ag but toxinA and B negative 5/30.   Patient was since then readmitted 6/21-6/24 for epistaxis and required transfusion. He was seen again in the ED on July 6 for hemoglobin 6.4 for which she was given 1 unit of transfusion, and July 19 for anemia and was given 1 unit of blood transfusion.   Today Completed course of antibiotics IV cefazolin and p.o. rifampin on 7/8 and remains off antibiotics He was discharged from SNF 2 weeks ago and currently lives with his girlfriend.  He denies any complaints except dry cough that was going on since he was started on HD during the time of hospitalization.  Denies chest pain or shortness of breath.  Denies nausea, vomiting or abdominal pain or diarrhea.  Appetite is good and energy also improved.  Discussed  that he needs to follow-up with hematology for ? TTP for which has been referred to [place] ? By mistake   Review of Systems: all systems reviewed with pertinent positive and negative as listed above  Past Medical History:  Diagnosis Date   Allergy    Anemia    Anxiety    Baker's cyst of knee    Blood transfusion without reported diagnosis    as baby    Coronary artery disease    quadruple bypass - March 2016   GERD (gastroesophageal reflux disease)    Gouty arthritis    "real bad" (01/17/2013)   Heart murmur    Hypercholesteremia    Hypertension    Myocardial infarction (HCC) 2017   Stroke (HCC)  Type II diabetes mellitus (HCC)    Past Surgical History:  Procedure Laterality Date   AORTIC VALVE REPLACEMENT N/A 11/25/2015   Procedure: AORTIC VALVE REPLACEMENT (AVR) USING A MAGNA EASE ;  Surgeon: Alleen Borne, MD;  Location: [facility] OR;  Service: Open Heart Surgery;  Laterality: N/A;   CARDIAC CATHETERIZATION N/A 11/19/2015   Procedure: Right/Left Heart Cath and Coronary Angiography;  Surgeon: Lyn Records, MD;  Location: [facility] INVASIVE CV LAB;  Service: Cardiovascular;  Laterality: N/A;   COLONOSCOPY  2004   CORONARY ARTERY BYPASS GRAFT N/A 11/25/2015   Procedure: CORONARY ARTERY BYPASS GRAFTING (CABG)x4 LIMA-LAD; SVG-OM; SVG-RCA; SVG-DIAG;  Surgeon: Alleen Borne, MD;  Location: [facility] OR;  Service: Open Heart Surgery;  Laterality: N/A;   CYST REMOVAL TRUNK N/A 08/24/2016   Procedure: EXCISION OF BACK ABSCESS;  Surgeon: Jimmye Norman, MD;  Location: [facility];  Service: General;  Laterality: N/A;   FINGER AMPUTATION Right 1980's   3rd & 4th digits "got them mashed" (01/17/2013)   KNEE ARTHROSCOPY Right 1980's   MULTIPLE EXTRACTIONS WITH ALVEOLOPLASTY Bilateral 11/21/2015   Procedure: Extraction of tooth #'s 2,12 with alveoloplasty and gross debridement of remaining dentition;  Surgeon: Charlynne Pander, DDS;  Location[facility] OR;  Service: Oral Surgery;  Laterality: Bilateral;    TEE WITHOUT CARDIOVERSION N/A 11/20/2015   Procedure: TRANSESOPHAGEAL ECHOCARDIOGRAM (TEE);  Surgeon: Laurey Morale, MD;  Location: [facility] ENDOSCOPY;  Service: Cardiovascular;  Laterality: N/A;   TEE WITHOUT CARDIOVERSION N/A 11/25/2015   Procedure: TRANSESOPHAGEAL ECHOCARDIOGRAM (TEE);  Surgeon: Alleen Borne, MD;  Location: [facility] OR;  Service: Open Heart Surgery;  Laterality: N/A;     Social History   Tobacco Use   Smoking status: Never   Smokeless tobacco: Never  Vaping Use   Vaping status: Never Used  Substance Use Topics   Alcohol use: Not Currently    Comment: occasionally   Drug use: Yes    Types: Marijuana    Comment: pt states occasionally    Family History  Problem Relation Age of Onset   Diabetes Father    Hypertension Father    Hypertension Mother    Hypertension Sister    Hypertension Brother    Hypertension Brother    Hypertension Brother    Colon cancer Neg Hx    Esophageal cancer Neg Hx    Rectal cancer Neg Hx    Stomach cancer Neg Hx    Colon polyps Neg Hx     Allergies  Allergen Reactions   Other Anaphylaxis    Mushrooms  Not listed on MAR    Plavix [Clopidogrel Bisulfate] Other (See Comments)    TTP Not listed on the [facility]   Fleet Enema [Enema] Other (See Comments)    Unknown reaction   Lovenox [Enoxaparin] Other (See Comments)    Unknown reaction   Morphine Other (See Comments)    Unknown reaction   Nsaids Other (See Comments)    Unknown reaction   Hydrocodone-Acetaminophen Itching    Health Maintenance  Topic Date Due   OPHTHALMOLOGY EXAM  Never done   COVID-19 Vaccine (4 - 2023-24 season) 05/07/2022   FOOT EXAM  03/13/2023   INFLUENZA VACCINE  04/07/2023   HEMOGLOBIN A1C  08/28/2023   Medicare Annual Wellness (AWV)  12/09/2023   Fecal DNA (Cologuard)  04/12/2025   DTaP/Tdap/Td (2 - Td or Tdap) 08/12/2032   Hepatitis C Screening  Completed   HIV Screening  Completed   Zoster Vaccines- Shingrix  Completed   HPV VACCINES  Aged Out     Objective: BP 108/75   Pulse 81   Temp (!) 97.3 F (36.3 C) (Oral)   Wt 211 lb (95.7 kg)   SpO2 97%   BMI 34.06 kg/m  Has antibody, it  Physical Exam Constitutional:      Appearance: Normal appearance.  HENT:     Head: Normocephalic and atraumatic.      Mouth: Mucous membranes are moist.  Eyes:    Conjunctiva/sclera: Conjunctivae normal.     Pupils: Pupils are equal, round, and bilaterally symmetrical  Cardiovascular:     Rate and Rhythm: Normal rate and regular rhythm.     Heart sounds: s1s2, murmur present  Pulmonary:     Effort: Pulmonary effort is normal.     Breath sounds: Normal breath sounds.   Abdominal:     General: Non distended     Palpations: soft.   Musculoskeletal:        General: Normal range of motion. Partial amputation of rt 3rd and 4th finger ( work related injury per patient)  Skin:    General: Skin is warm and dry.     Comments: Rt chest HD catheter without any concerns   Neurological:     General: grossly non focal     Mental Status: awake, alert and oriented to person, place, and time.   Psychiatric:        Mood and Affect: Mood normal.   Lab Results Lab Results  Component Value Date   WBC 9.6 03/25/2023   HGB 6.0 (LL) 03/25/2023   HCT 19.6 (L) 03/25/2023   MCV 98.5 03/25/2023   PLT 158 03/25/2023    Lab Results  Component Value Date   CREATININE 2.48 (H) 03/25/2023   BUN 19 03/25/2023   NA 136 03/25/2023   K 3.7 03/25/2023   CL 99 03/25/2023   CO2 26 03/25/2023    Lab Results  Component Value Date   ALT 7 03/25/2023   AST 18 03/25/2023   ALKPHOS 68 03/25/2023   BILITOT 0.8 03/25/2023    Lab Results  Component Value Date   CHOL 171 11/04/2021   HDL 60 11/04/2021   LDLCALC 90 11/04/2021   TRIG 117 11/04/2021   CHOLHDL 2.9 11/04/2021   No results found for: "LABRPR", "RPRTITER" No results found for: "HIV1RNAQUANT", "HIV1RNAVL", "CD4TABS"   Assessment/Plan # 43 Y O male with multiple comorbidities as above  with multiple hospitalizations and ED visits as above # TV MSSA endocarditis  - s/p completion of antibiotics on 7/2 - Will do surveillance blood cultures * 2 due to + of bioprosthetic AV  # Medication management  - 7/19 CBC and BMP reviewed   # ? TTP  - reason for fu  per appointment notes, however last platelets count was normal and TTP seems to have ruled out during hospitalization at [facility] with hematology   I have personally spent 76 minutes involved in face-to-face and non-face-to-face activities for this patient on the day of the visit. Professional time spent includes the following activities: Preparing to see the patient (review of tests), Obtaining and/or reviewing separately obtained history (admission/discharge record), Performing a medically appropriate examination and/or evaluation , Ordering medications/tests/procedures, referring and communicating with other health care professionals, Documenting clinical information in the EMR, Independently interpreting results (not separately reported), Communicating results to the patient/family/caregiver, Counseling and educating the patient/family/caregiver and Care coordination (not separately reported).   Victoriano Lain, MD [facility]  for Infectious Disease [facility] 03/30/2023, 1:11 PM

## 2023-03-31 ENCOUNTER — Telehealth: Payer: Self-pay

## 2023-03-31 LAB — CULTURE, BLOOD (SINGLE)
MICRO NUMBER:: 15241939
SPECIMEN QUALITY:: ADEQUATE
SPECIMEN QUALITY:: ADEQUATE

## 2023-03-31 NOTE — Telephone Encounter (Signed)
I called the patient because he has 3 appointments scheduled in the next 2 weeks and he does not need all 3 appointments.  He has dialysis on Thursdays so I cancelled 04/07/2023 and 04/14/2023 and he was in agreement to cancelling those appointments.  . We kept his appointment for 04/06/2023 and he said he will be there.  It is a non-dialysis day.

## 2023-04-05 DIAGNOSIS — N170 Acute kidney failure with tubular necrosis: Secondary | ICD-10-CM | POA: Diagnosis not present

## 2023-04-05 DIAGNOSIS — Z992 Dependence on renal dialysis: Secondary | ICD-10-CM | POA: Diagnosis not present

## 2023-04-05 DIAGNOSIS — T8249XD Other complication of vascular dialysis catheter, subsequent encounter: Secondary | ICD-10-CM | POA: Diagnosis not present

## 2023-04-06 ENCOUNTER — Inpatient Hospital Stay: Payer: PPO | Admitting: Physician Assistant

## 2023-04-06 ENCOUNTER — Other Ambulatory Visit: Payer: Self-pay | Admitting: Hematology and Oncology

## 2023-04-06 ENCOUNTER — Emergency Department (HOSPITAL_COMMUNITY): Payer: PPO

## 2023-04-06 ENCOUNTER — Emergency Department (HOSPITAL_COMMUNITY)
Admission: EM | Admit: 2023-04-06 | Discharge: 2023-04-06 | Disposition: A | Discharge status: Home / Self Care | Payer: PPO | Attending: Emergency Medicine | Admitting: Emergency Medicine

## 2023-04-06 ENCOUNTER — Telehealth: Payer: Self-pay | Admitting: Hematology and Oncology

## 2023-04-06 ENCOUNTER — Other Ambulatory Visit: Payer: Self-pay

## 2023-04-06 ENCOUNTER — Telehealth: Payer: Self-pay | Admitting: *Deleted

## 2023-04-06 ENCOUNTER — Encounter (HOSPITAL_COMMUNITY): Payer: Self-pay

## 2023-04-06 DIAGNOSIS — R799 Abnormal finding of blood chemistry, unspecified: Secondary | ICD-10-CM | POA: Diagnosis present

## 2023-04-06 DIAGNOSIS — Z992 Dependence on renal dialysis: Secondary | ICD-10-CM | POA: Insufficient documentation

## 2023-04-06 DIAGNOSIS — N189 Chronic kidney disease, unspecified: Secondary | ICD-10-CM | POA: Insufficient documentation

## 2023-04-06 DIAGNOSIS — I517 Cardiomegaly: Secondary | ICD-10-CM | POA: Diagnosis not present

## 2023-04-06 DIAGNOSIS — R0602 Shortness of breath: Secondary | ICD-10-CM | POA: Diagnosis not present

## 2023-04-06 DIAGNOSIS — D649 Anemia, unspecified: Secondary | ICD-10-CM | POA: Diagnosis not present

## 2023-04-06 DIAGNOSIS — J939 Pneumothorax, unspecified: Secondary | ICD-10-CM | POA: Diagnosis not present

## 2023-04-06 DIAGNOSIS — Z452 Encounter for adjustment and management of vascular access device: Secondary | ICD-10-CM | POA: Diagnosis not present

## 2023-04-06 DIAGNOSIS — N179 Acute kidney failure, unspecified: Secondary | ICD-10-CM | POA: Diagnosis not present

## 2023-04-06 LAB — CBC
HCT: 20.1 % — ABNORMAL LOW (ref 39.0–52.0)
Hemoglobin: 6 g/dL — CL (ref 13.0–17.0)
MCH: 29.9 pg (ref 26.0–34.0)
MCHC: 29.9 g/dL — ABNORMAL LOW (ref 30.0–36.0)
MCV: 100 fL (ref 80.0–100.0)
Platelets: 117 10*3/uL — ABNORMAL LOW (ref 150–400)
RBC: 2.01 MIL/uL — ABNORMAL LOW (ref 4.22–5.81)
RDW: 24.4 % — ABNORMAL HIGH (ref 11.5–15.5)
WBC: 9.2 10*3/uL (ref 4.0–10.5)
nRBC: 0 % (ref 0.0–0.2)

## 2023-04-06 LAB — PREPARE RBC (CROSSMATCH)

## 2023-04-06 LAB — COMPREHENSIVE METABOLIC PANEL
ALT: 8 U/L (ref 0–44)
AST: 13 U/L — ABNORMAL LOW (ref 15–41)
Albumin: 2.6 g/dL — ABNORMAL LOW (ref 3.5–5.0)
Alkaline Phosphatase: 75 U/L (ref 38–126)
Anion gap: 11 (ref 5–15)
BUN: 16 mg/dL (ref 8–23)
CO2: 26 mmol/L (ref 22–32)
Calcium: 8.7 mg/dL — ABNORMAL LOW (ref 8.9–10.3)
Chloride: 98 mmol/L (ref 98–111)
Creatinine, Ser: 1.98 mg/dL — ABNORMAL HIGH (ref 0.61–1.24)
GFR, Estimated: 37 mL/min — ABNORMAL LOW (ref >60)
Glucose, Bld: 125 mg/dL — ABNORMAL HIGH (ref 70–99)
Potassium: 4.1 mmol/L (ref 3.5–5.1)
Sodium: 135 mmol/L (ref 135–145)
Total Bilirubin: 0.7 mg/dL (ref 0.3–1.2)
Total Protein: 7.7 g/dL (ref 6.5–8.1)

## 2023-04-06 LAB — BPAM RBC
Blood Product Expiration Date: 202408302359
ISSUE DATE / TIME: 202407311238
Unit Type and Rh: 7300

## 2023-04-06 LAB — TYPE AND SCREEN
ABO/RH(D): B POS
Antibody Screen: NEGATIVE
Unit division: 0

## 2023-04-06 MED ORDER — SODIUM CHLORIDE 0.9% IV SOLUTION: Freq: Once | INTRAVENOUS | Status: DC

## 2023-04-06 NOTE — ED Provider Notes (Signed)
[place] EMERGENCY DEPARTMENT AT [facility] Provider Note   CSN: [id number] Arrival date & time: 04/06/23  1002     History  Chief Complaint  Patient presents with   Abnormal Lab    [name] is a 62 y.o. male with a history of chronic anemia, chronic kidney disease on dialysis, high blood pressure, presented to the ED with concern for low hemoglobin level.  Patient had outpatient lab work done and had a hemoglobin of 6.0, baseline is typically close to 7.0.  He says he has been mildly winded with exertion the past several days, has no significant symptoms.  He says currently feels "completely fine".  He said he is here because his doctor told him to come in.  He has not missed any dialysis this week and a scheduled to go back tomorrow.  He denies any black or tarry stools.  He reports has had numerous stool test in the past and has never had GI bleeding issues.  He has received blood transfusions in the past for anemia.  He is not on blood thinners or anticoagulation due to bleeding risks.  Most recently was hospitalized in June, approximately 1 month ago, for suspected multifactorial anemia, for which she received a unit of packed red blood cells, and anemia was felt to be likely related to renal disease.  He has been treated prior to that for MSSA bacteremia and septic shock with an extensive course of antibiotics which were completed at the start of July.  He has kidney disease related to ATN, for which he is on dialysis Monday Wednesday Friday.  Also has a history of A-fib but is not on anticoagulation due to concern for bleeding risk.  HPI     Home Medications Prior to Admission medications   Medication Sig Start Date End Date Taking? Authorizing Provider  acetaminophen (TYLENOL) 325 MG tablet Take 650 mg by mouth every 4 (four) hours as needed for mild pain.    [provider]  acetaminophen (TYLENOL) 500 MG tablet Take 1,000 mg by mouth every 8 (eight)  hours.    [provider]  amLODipine (NORVASC) 10 MG tablet Take 1 tablet by mouth once daily 02/01/23   Marcine Matar, MD  atorvastatin (LIPITOR) 80 MG tablet TAKE 1 TABLET BY MOUTH ONCE DAILY AT 6 IN THE EVENING Patient taking differently: Take 80 mg by mouth at bedtime. 11/11/22   Marcine Matar, MD  carvedilol (COREG) 6.25 MG tablet Take 1 tablet (6.25 mg total) by mouth 2 (two) times daily. 01/11/23   Gaston Islam., NP  colchicine 0.6 MG tablet Take 1 tablet (0.6 mg total) by mouth 2 (two) times daily as needed. Patient not taking: Reported on 02/26/2023 12/16/22   Tarry Kos, MD  ezetimibe (ZETIA) 10 MG tablet Take 1 tablet (10 mg total) by mouth daily. 11/11/22   Marcine Matar, MD  FARXIGA 10 MG TABS tablet Take 10 mg by mouth daily. 02/04/22   [provider]  fluticasone Aleda Grana) 50 MCG/ACT nasal spray Use 2 spray(s) in each nostril once daily 10/26/22   Marcine Matar, MD  furosemide (LASIX) 40 MG tablet 1 tablet p.o. q. Mondays, Wednesday and Fridays. 12/13/22   Marcine Matar, MD  gabapentin (NEURONTIN) 100 MG capsule Take 100 mg by mouth at bedtime.    [provider]  ipratropium-albuterol (DUONEB) 0.5-2.5 (3) MG/3ML SOLN Take 3 mLs by nebulization every 6 (six) hours as needed (for  wheezing).    [provider]  lidocaine (LIDOCAINE PAIN RELIEF) 4 % Place 1 patch onto the skin daily.    [provider]  lisinopril (ZESTRIL) 40 MG tablet Take 1 tablet (40 mg total) by mouth daily. 08/12/22   Marcine Matar, MD  neomycin-polymyxin b-dexamethasone (MAXITROL) 3.5-10000-0.1 OINT Place 1 Application into the right eye in the morning and at bedtime. for 10 days starting 0618/2024    [provider]  polyvinyl alcohol (ARTIFICIAL TEARS) 1.4 % ophthalmic solution Place 1 drop into both eyes every 6 (six) hours as needed for dry eyes.    [provider]  potassium chloride SA (KLOR-CON M) 20 MEQ tablet TAKE 1  TABLET BY MOUTH ON MONDAY, Wed, FRIDAY and Saturdays. Patient not taking: Reported on 02/26/2023 12/13/22   Marcine Matar, MD  rifampin (RIFADIN) 300 MG capsule Take 300 mg by mouth every 8 (eight) hours. for endocarditis until 03/14/23 1159    [provider]  sodium bicarbonate 650 MG tablet Take 1,300 mg by mouth 3 (three) times daily.    [provider]      Allergies    Other, Plavix [clopidogrel bisulfate], Fleet enema [enema], Lovenox [enoxaparin], Morphine, Nsaids, and Hydrocodone-acetaminophen    Review of Systems   Review of Systems  Physical Exam Updated Vital Signs BP 108/73 (BP Location: Right Arm)   Pulse 71   Temp 97.9 F (36.6 C) (Oral)   Resp 16   Ht 5\' 6"  (1.676 m)   Wt 95.7 kg   SpO2 100%   BMI 34.05 kg/m  Physical Exam Constitutional:      General: He is not in acute distress. HENT:     Head: Normocephalic and atraumatic.  Eyes:     Conjunctiva/sclera: Conjunctivae normal.     Pupils: Pupils are equal, round, and reactive to light.  Cardiovascular:     Rate and Rhythm: Normal rate. Rhythm irregular.  Pulmonary:     Effort: Pulmonary effort is normal. No respiratory distress.  Abdominal:     General: There is no distension.     Tenderness: There is no abdominal tenderness.  Skin:    General: Skin is warm and dry.  Neurological:     General: No focal deficit present.     Mental Status: He is alert. Mental status is at baseline.  Psychiatric:        Mood and Affect: Mood normal.        Behavior: Behavior normal.     ED Results / Procedures / Treatments   Labs (all labs ordered are listed, but only abnormal results are displayed) Labs Reviewed  COMPREHENSIVE METABOLIC PANEL - Abnormal; Notable for the following components:      Result Value   Glucose, Bld 125 (*)    Creatinine, Ser 1.98 (*)    Calcium 8.7 (*)    Albumin 2.6 (*)    AST 13 (*)    GFR, Estimated 37 (*)    All other components within normal limits  CBC -  Abnormal; Notable for the following components:   RBC 2.01 (*)    Hemoglobin 6.0 (*)    HCT 20.1 (*)    MCHC 29.9 (*)    RDW 24.4 (*)    Platelets 117 (*)    All other components within normal limits  POC OCCULT BLOOD, ED  TYPE AND SCREEN  PREPARE RBC (CROSSMATCH)    EKG None  Radiology DG Chest Portable 1 View  Result Date:  04/06/2023 CLINICAL DATA:  shortness of breath EXAM: PORTABLE CHEST 1 VIEW COMPARISON:  CXR 02/28/23 FINDINGS: Right-sided central venous catheter with tip in the lower SVC. Status post median sternotomy. Cardiomegaly. No pleural effusion. Pneumothorax. No focal airspace opacity. No radiographically apparent displaced rib fractures. Visualized upper abdomen is unremarkable IMPRESSION: No focal airspace opacity Electronically Signed   By: Lorenza Cambridge M.D.   On: 04/06/2023 12:56    Procedures .Critical Care  Performed by: Terald Sleeper, MD Authorized by: Terald Sleeper, MD   Critical care provider statement:    Critical care time (minutes):  45   Critical care time was exclusive of:  Separately billable procedures and treating other patients   Critical care was necessary to treat or prevent imminent or life-threatening deterioration of the following conditions:  Circulatory failure   Critical care was time spent personally by me on the following activities:  Ordering and performing treatments and interventions, ordering and review of laboratory studies, ordering and review of radiographic studies, pulse oximetry, review of old charts, examination of patient and evaluation of patient's response to treatment Comments:     Blood transfusion for anemia     Medications Ordered in ED Medications  0.9 %  sodium chloride infusion (Manually program via Guardrails IV Fluids) (has no administration in time range)    ED Course/ Medical Decision Making/ A&P                                 Medical Decision Making Amount and/or Complexity of Data  Reviewed Labs: ordered. Radiology: ordered.  Risk Prescription drug management.   This patient presents to the ED with concern for gradually worsening anemia. This involves an extensive number of treatment options, and is a complaint that carries with it a high risk of complications and morbidity.  The differential diagnosis includes multifactorial anemia, most likely related to chronic renal disease.  Less likely an acute GI bleed or hemorrhage.  Patient was consented for 1 unit blood transfusion.  I suspect this likely stabilize his hemoglobin closer to his baseline level 7.0.  Given that he is minimally symptomatic, currently asymptomatic, and I do not believe he is requiring hospitalization.  I suspect this is a slow anemic process, which will need to be monitored as an outpatient.  His hemoglobin has been stable over the past 12 days is 6.0.  Co-morbidities that complicate the patient evaluation: Chronic kidney disease at high risk of anemia and dialysis complication  External records from outside source obtained and reviewed including hospital discharge summary from June as noted above  I ordered and personally interpreted labs.  The pertinent results include:  K 4.1, wnl, Hgb 6.0 (stable from 12 days ago)  I ordered imaging studies including x-ray of the chest I independently visualized and interpreted imaging which showed no emergent findings I agree with the radiologist interpretation  The patient was maintained on a cardiac monitor.  I personally viewed and interpreted the cardiac monitored which showed an underlying rhythm of: Normal heart rate   I ordered medication including 1 unit blood transfusion after consent with patient  I have reviewed the patients home medicines and have made adjustments as needed  Test Considered: No indication for CT imaging of the abdomen at this time   After the interventions noted above, I reevaluated the patient and found that they have:  improved   Dispostion:  After consideration of the  diagnostic results and the patients response to treatment, I feel that the patent would benefit from outpatient follow-up.  At this time I do not see an indication for emergent hospitalization as I suspect this anemia is chronic, and his hemoglobin has been stable for about 2 weeks, and he is otherwise asymptomatic.         Final Clinical Impression(s) / ED Diagnoses Final diagnoses:  Anemia, unspecified type    Rx / DC Orders ED Discharge Orders     None         Terald Sleeper, MD 04/06/23 1555

## 2023-04-06 NOTE — ED Triage Notes (Signed)
Pt sent by PCP for low hemoglobin. Pt's hemoglobin was 6.0. Pt denies weakness, dizziness or SOB.

## 2023-04-06 NOTE — ED Notes (Signed)
This paramedic sat with patient for first 15 minutes of blood transfusion. No adverse affects. Pt resting.Marland KitchenMarland Kitchen

## 2023-04-06 NOTE — Discharge Instructions (Addendum)
You were given a blood transfusion of 1 unit of blood in the ER for anemia today.  Your hemoglobin was 6.0 when you arrived.  One unit of blood is expected to bring your hemoglobin closer to 7.0, which is her normal baseline level.  Your anemia may be due to multiple medical reasons.  This could include your kidney disease, as your body has a hard time making new red blood cells.  You should follow-up with either your primary care provider or your nephrologist for this issue.  You may need regular transfusions scheduled as an outpatient moving forward.  You should go to your normal scheduled dialysis tomorrow.

## 2023-04-06 NOTE — Telephone Encounter (Signed)
This RN spoke with pt per his call stating he was told at dialysis yesterday that he needs to come here today to get a blood transfusion.  Noted pt is scheduled for 04/11/2023 for lab- visit and transfusion.  He does not know what his heme is but states he is very concerned.  Per chart review no noted communication or recent labs.  Pt goes to [facility] on [address] number [phone].  This RN called above was informed they are sending the patient to the ER due to heme of 5.9 and per communication with this office- unable to transfuse today.  (Note do not see whom they have communicated with).  Appt for for Monday will be maintained due to possible need for additional transfusion.  No further needs at this time for patient care.  This RN called pt who stated he is on his way to the ER per recommendation of [facility]- he understands to keep appt for 04/11/2023.

## 2023-04-07 ENCOUNTER — Ambulatory Visit: Payer: PPO | Admitting: Physician Assistant

## 2023-04-07 DIAGNOSIS — Z992 Dependence on renal dialysis: Secondary | ICD-10-CM | POA: Diagnosis not present

## 2023-04-07 DIAGNOSIS — T8249XD Other complication of vascular dialysis catheter, subsequent encounter: Secondary | ICD-10-CM | POA: Diagnosis not present

## 2023-04-07 DIAGNOSIS — N170 Acute kidney failure with tubular necrosis: Secondary | ICD-10-CM | POA: Diagnosis not present

## 2023-04-10 DIAGNOSIS — N179 Acute kidney failure, unspecified: Secondary | ICD-10-CM | POA: Diagnosis not present

## 2023-04-11 ENCOUNTER — Inpatient Hospital Stay (HOSPITAL_BASED_OUTPATIENT_CLINIC_OR_DEPARTMENT_OTHER): Payer: PPO | Admitting: Hematology and Oncology

## 2023-04-11 ENCOUNTER — Encounter: Payer: Self-pay | Admitting: Hematology and Oncology

## 2023-04-11 ENCOUNTER — Other Ambulatory Visit: Payer: Self-pay

## 2023-04-11 ENCOUNTER — Inpatient Hospital Stay: Payer: PPO

## 2023-04-11 ENCOUNTER — Inpatient Hospital Stay: Payer: PPO | Attending: Hematology and Oncology

## 2023-04-11 VITALS — BP 113/59 | HR 59 | Temp 98.2°F | Resp 18 | Ht 66.0 in | Wt 206.8 lb

## 2023-04-11 DIAGNOSIS — D539 Nutritional anemia, unspecified: Secondary | ICD-10-CM

## 2023-04-11 DIAGNOSIS — Z992 Dependence on renal dialysis: Secondary | ICD-10-CM | POA: Diagnosis not present

## 2023-04-11 DIAGNOSIS — N186 End stage renal disease: Secondary | ICD-10-CM | POA: Diagnosis not present

## 2023-04-11 DIAGNOSIS — R7402 Elevation of levels of lactic acid dehydrogenase (LDH): Secondary | ICD-10-CM | POA: Diagnosis not present

## 2023-04-11 DIAGNOSIS — I2510 Atherosclerotic heart disease of native coronary artery without angina pectoris: Secondary | ICD-10-CM | POA: Diagnosis not present

## 2023-04-11 DIAGNOSIS — Z951 Presence of aortocoronary bypass graft: Secondary | ICD-10-CM | POA: Diagnosis not present

## 2023-04-11 DIAGNOSIS — R7989 Other specified abnormal findings of blood chemistry: Secondary | ICD-10-CM | POA: Diagnosis not present

## 2023-04-11 DIAGNOSIS — D649 Anemia, unspecified: Secondary | ICD-10-CM

## 2023-04-11 LAB — CBC WITH DIFFERENTIAL/PLATELET
Abs Immature Granulocytes: 0.09 10*3/uL — ABNORMAL HIGH (ref 0.00–0.07)
Basophils Absolute: 0 10*3/uL (ref 0.0–0.1)
Basophils Relative: 0 %
Eosinophils Absolute: 0 10*3/uL (ref 0.0–0.5)
Eosinophils Relative: 0 %
HCT: 21.1 % — ABNORMAL LOW (ref 39.0–52.0)
Hemoglobin: 6.8 g/dL — CL (ref 13.0–17.0)
Immature Granulocytes: 1 %
Lymphocytes Relative: 24 %
Lymphs Abs: 2 10*3/uL (ref 0.7–4.0)
MCH: 31.2 pg (ref 26.0–34.0)
MCHC: 32.2 g/dL (ref 30.0–36.0)
MCV: 96.8 fL (ref 80.0–100.0)
Monocytes Absolute: 2.9 10*3/uL — ABNORMAL HIGH (ref 0.1–1.0)
Monocytes Relative: 35 %
Neutro Abs: 3.3 10*3/uL (ref 1.7–7.7)
Neutrophils Relative %: 40 %
Platelets: 132 10*3/uL — ABNORMAL LOW (ref 150–400)
RBC: 2.18 MIL/uL — ABNORMAL LOW (ref 4.22–5.81)
RDW: 22.9 % — ABNORMAL HIGH (ref 11.5–15.5)
WBC: 8.3 10*3/uL (ref 4.0–10.5)
nRBC: 0 % (ref 0.0–0.2)

## 2023-04-11 LAB — TYPE AND SCREEN
ABO/RH(D): B POS
Antibody Screen: NEGATIVE
Unit division: 0
Unit division: 0

## 2023-04-11 LAB — BPAM RBC
Blood Product Expiration Date: 202409032359
Blood Product Expiration Date: 202409042359
ISSUE DATE / TIME: 202408051152
ISSUE DATE / TIME: 202408051152
Unit Type and Rh: 7300
Unit Type and Rh: 7300

## 2023-04-11 LAB — SAMPLE TO BLOOD BANK

## 2023-04-11 LAB — PREPARE RBC (CROSSMATCH)

## 2023-04-11 MED ORDER — SODIUM CHLORIDE 0.9% IV SOLUTION
250.0000 mL | Freq: Once | INTRAVENOUS | Status: AC
  Administered 2023-04-11: 250 mL via INTRAVENOUS

## 2023-04-11 MED ORDER — DIPHENHYDRAMINE HCL 25 MG PO CAPS
25.0000 mg | ORAL_CAPSULE | Freq: Once | ORAL | Status: AC
  Administered 2023-04-11: 25 mg via ORAL
  Filled 2023-04-11: qty 1

## 2023-04-11 MED ORDER — ACETAMINOPHEN 325 MG PO TABS
650.0000 mg | ORAL_TABLET | Freq: Once | ORAL | Status: AC
  Administered 2023-04-11: 650 mg via ORAL
  Filled 2023-04-11: qty 2

## 2023-04-11 NOTE — Addendum Note (Signed)
Addended by: Morrell Riddle on: 04/11/2023 11:20 AM   Modules accepted: Orders

## 2023-04-11 NOTE — Patient Instructions (Signed)

## 2023-04-11 NOTE — Assessment & Plan Note (Signed)
I will try to work around his hemodialysis schedule of Tuesday, Thursday and Saturday According to discussion with nephrologist, there is no contraindication to give him blood transfusion and the patient is not at risk of fluid overload

## 2023-04-11 NOTE — Progress Notes (Signed)
[facility] OFFICE PROGRESS NOTE  Marcine Matar, MD  ASSESSMENT & PLAN:  Deficiency anemia The patient has multifactorial anemia, combination of bone marrow suppression from severe endocarditis and antibiotics and anemia chronic renal failure Due to his history of heart disease, he would need blood transfusion to keep his hemoglobin above 8  We discussed some of the risks, benefits, and alternatives of blood transfusions. The patient is symptomatic from anemia and the hemoglobin level is critically low.  Some of the side-effects to be expected including risks of transfusion reactions, chills, infection, syndrome of volume overload and risk of hospitalization from various reasons and the patient is willing to proceed and went ahead to sign consent today. I recommend 2 units of blood He will return in about 2 weeks for further follow-up I have discussed the case with his nephrologist who will be arranging for ESA and IV iron in the dialysis unit  ESRD (end stage renal disease) on dialysis [facility]) I will try to work around his hemodialysis schedule of Tuesday, Thursday and Saturday According to discussion with nephrologist, there is no contraindication to give him blood transfusion and the patient is not at risk of fluid overload  H/O heart bypass surgery He had coronary artery disease and history of bypass surgery We will keep transfusion threshold above 8  Orders Placed This Encounter  Procedures   Informed Consent Details: Physician/Practitioner Attestation; Transcribe to consent form and obtain patient signature    Standing Status:   Future    Standing Expiration Date:   04/10/2024    Order Specific Question:   Physician/Practitioner attestation of informed consent for blood and or blood product transfusion    Answer:   I, the physician/practitioner, attest that I have discussed with the patient the benefits, risks, side effects, alternatives, likelihood of achieving  goals and potential problems during recovery for the procedure that I have provided informed consent.    Order Specific Question:   Product(s)    Answer:   All Product(s)   Care order/instruction    Transfuse Parameters    Standing Status:   Future    Standing Expiration Date:   04/10/2024   Type and screen         Standing Status:   Future    Number of Occurrences:   1    Standing Expiration Date:   04/10/2024   Prepare RBC (crossmatch)    Standing Status:   Standing    Number of Occurrences:   1    Order Specific Question:   # of Units    Answer:   2 units    Order Specific Question:   Transfusion Indications    Answer:   Hemoglobin < 7 gm/dL and symptomatic    Order Specific Question:   Number of Units to Keep Ahead    Answer:   NO units ahead    Order Specific Question:   Instructions:    Answer:   Transfuse    Order Specific Question:   If emergent release call blood bank    Answer:   Not emergent release    The total time spent in the appointment was 40 minutes encounter with patients including review of chart and various tests results, discussions about plan of care and coordination of care plan   All questions were answered. The patient knows to call the clinic with any problems, questions or concerns. No barriers to learning was detected.    Artis Delay, MD  8/5/202410:59 AM  INTERVAL HISTORY: [name] 62 y.o. male returns for further follow-up He was last seen in the hospital in May At the time, it was thought that he might have TTP but it turns out that the patient have endocarditis causing renal failure, anemia and thrombocytopenia The patient is now back at home He has completed antibiotic therapy Unfortunately, he is dialysis dependent on Tuesday, Thursday and Saturday He has recurrent ER visits due to severe anemia requiring blood transfusion and his nephrologist referred him here for transfusion support The patient denies any recent signs or symptoms of  bleeding such as spontaneous epistaxis, hematuria or hematochezia.   SUMMARY OF HEMATOLOGIC HISTORY: This is my initial dictation from Jan 29, 2023 for further details He was seen in the hospital due to constellation of symptoms worrisome for TTP The patient was intubated by the time I rounded on him this morning.  His son and significant other present in the waiting room.  I have reviewed the case with ICU team as well as nephrologist this morning I have reviewed his chart extensively The patient have significant background history of coronary artery disease, stroke, diabetes, hypertension, hyperlipidemia, chronic kidney disease and others.  The patient has been on Plavix for a very long time, since 2019 after presentation with stroke. Approximately a month ago, he was noted to have acute on chronic renal failure and was referred to cardiologist for follow-up.  He also have recent acute gout. Yesterday, he was brought into the emergency department.  His significant other noticed that the patient have acute confusion, shortness of breath, diaphoresis as well as near syncopal episode Soon after arrival to the ER, he has rapid deterioration with respiratory failure, profound abnormal labs, and altered mental status.  He was intubated. There were reported history of fever.  Sepsis was also was in the differential and significant cultures were drawn. His baseline CBC from August 12, 2022 showed hemoglobin of 10.2, platelet count of 252 and creatinine of 1.94 Late last night, his white count was 9.6, hemoglobin was 6.8, MCV was high at 115.7 and platelet count of 121.  Complete metabolic panel revealed creatinine of 4.24 with elevated serum bilirubin of 1.9.  His PT APTT and INR were also abnormal.  Fibrinogen level was adequate.  He is noted to have elevated D-dimer.  LDH was elevated at 597 Schistocytes were noted on peripheral smear.  Diagnosis of TTP was suspected.  The ICU team placed central line  urgently. There were no reported signs of bleeding by the nursing staff The patient was subsequently transferred to [facility] when he was found to have bacteremia  I have reviewed the past medical history, past surgical history, social history and family history with the patient and they are unchanged from previous note.  ALLERGIES:  is allergic to other, plavix [clopidogrel bisulfate], fleet enema [enema], lovenox [enoxaparin], morphine, nsaids, and hydrocodone-acetaminophen.  MEDICATIONS:  Current Outpatient Medications  Medication Sig Dispense Refill   acetaminophen (TYLENOL) 325 MG tablet Take 650 mg by mouth every 4 (four) hours as needed for mild pain.     acetaminophen (TYLENOL) 500 MG tablet Take 1,000 mg by mouth every 8 (eight) hours.     amLODipine (NORVASC) 10 MG tablet Take 1 tablet by mouth once daily 90 tablet 0   atorvastatin (LIPITOR) 80 MG tablet TAKE 1 TABLET BY MOUTH ONCE DAILY AT 6 IN THE EVENING (Patient taking differently: Take 80 mg by mouth at bedtime.) 90 tablet 3  carvedilol (COREG) 6.25 MG tablet Take 1 tablet (6.25 mg total) by mouth 2 (two) times daily. 180 tablet 2   colchicine 0.6 MG tablet Take 1 tablet (0.6 mg total) by mouth 2 (two) times daily as needed. (Patient not taking: Reported on 02/26/2023) 20 tablet 0   ezetimibe (ZETIA) 10 MG tablet Take 1 tablet (10 mg total) by mouth daily. 90 tablet 3   FARXIGA 10 MG TABS tablet Take 10 mg by mouth daily.     fluticasone (FLONASE) 50 MCG/ACT nasal spray Use 2 spray(s) in each nostril once daily 48 g 0   furosemide (LASIX) 40 MG tablet 1 tablet p.o. q. Mondays, Wednesday and Fridays. 48 tablet 3   gabapentin (NEURONTIN) 100 MG capsule Take 100 mg by mouth at bedtime.     ipratropium-albuterol (DUONEB) 0.5-2.5 (3) MG/3ML SOLN Take 3 mLs by nebulization every 6 (six) hours as needed (for wheezing).     lidocaine (LIDOCAINE PAIN RELIEF) 4 % Place 1 patch onto the skin daily.     lisinopril (ZESTRIL) 40 MG tablet  Take 1 tablet (40 mg total) by mouth daily. 90 tablet 1   neomycin-polymyxin b-dexamethasone (MAXITROL) 3.5-10000-0.1 OINT Place 1 Application into the right eye in the morning and at bedtime. for 10 days starting 0618/2024     polyvinyl alcohol (ARTIFICIAL TEARS) 1.4 % ophthalmic solution Place 1 drop into both eyes every 6 (six) hours as needed for dry eyes.     potassium chloride SA (KLOR-CON M) 20 MEQ tablet TAKE 1 TABLET BY MOUTH ON MONDAY, Wed, FRIDAY and Saturdays. (Patient not taking: Reported on 02/26/2023) 48 tablet 2   rifampin (RIFADIN) 300 MG capsule Take 300 mg by mouth every 8 (eight) hours. for endocarditis until 03/14/23 1159     sodium bicarbonate 650 MG tablet Take 1,300 mg by mouth 3 (three) times daily.     No current facility-administered medications for this visit.     REVIEW OF SYSTEMS:   Constitutional: Denies fevers, chills or night sweats Eyes: Denies blurriness of vision Ears, nose, mouth, throat, and face: Denies mucositis or sore throat Respiratory: Denies cough, dyspnea or wheezes Cardiovascular: Denies palpitation, chest discomfort or lower extremity swelling Gastrointestinal:  Denies nausea, heartburn or change in bowel habits Skin: Denies abnormal skin rashes Lymphatics: Denies new lymphadenopathy or easy bruising Neurological:Denies numbness, tingling or new weaknesses Behavioral/Psych: Mood is stable, no new changes  All other systems were reviewed with the patient and are negative.  PHYSICAL EXAMINATION: ECOG PERFORMANCE STATUS: 1 - Symptomatic but completely ambulatory  Vitals:   04/11/23 0946  BP: (!) 113/59  Pulse: (!) 59  Resp: 18  Temp: 98.2 F (36.8 C)  SpO2: 100%   Filed Weights   04/11/23 0946  Weight: 206 lb 12.8 oz (93.8 kg)    GENERAL:alert, no distress and comfortable SKIN: skin color, texture, turgor are normal, no rashes or significant lesions EYES: normal, Conjunctiva are pink and non-injected, sclera clear OROPHARYNX:no  exudate, no erythema and lips, buccal mucosa, and tongue normal  NECK: supple, thyroid normal size, non-tender, without nodularity LYMPH:  no palpable lymphadenopathy in the cervical, axillary or inguinal LUNGS: clear to auscultation and percussion with normal breathing effort HEART: regular rate & rhythm and no murmurs and no lower extremity edema ABDOMEN:abdomen soft, non-tender and normal bowel sounds Musculoskeletal:no cyanosis of digits and no clubbing  NEURO: alert & oriented x 3 with fluent speech, no focal motor/sensory deficits  LABORATORY DATA:  I have reviewed the data as  listed     Component Value Date/Time   NA 135 04/06/2023 1024   NA 143 11/02/2022 1454   K 4.1 04/06/2023 1024   CL 98 04/06/2023 1024   CO2 26 04/06/2023 1024   GLUCOSE 125 (H) 04/06/2023 1024   BUN 16 04/06/2023 1024   BUN 31 (H) 11/02/2022 1454   CREATININE 1.98 (H) 04/06/2023 1024   CREATININE 1.05 02/24/2015 1601   CALCIUM 8.7 (L) 04/06/2023 1024   PROT 7.7 04/06/2023 1024   PROT 6.8 11/04/2021 1028   ALBUMIN 2.6 (L) 04/06/2023 1024   ALBUMIN 3.9 11/04/2021 1028   AST 13 (L) 04/06/2023 1024   ALT 8 04/06/2023 1024   ALKPHOS 75 04/06/2023 1024   BILITOT 0.7 04/06/2023 1024   BILITOT <0.2 11/04/2021 1028   GFRNONAA 37 (L) 04/06/2023 1024   GFRNONAA 80 02/24/2015 1601   GFRAA 62 10/09/2020 1520   GFRAA >89 02/24/2015 1601    No results found for: "SPEP", "UPEP"  Lab Results  Component Value Date   WBC 8.3 04/11/2023   NEUTROABS 3.3 04/11/2023   HGB 6.8 (LL) 04/11/2023   HCT 21.1 (L) 04/11/2023   MCV 96.8 04/11/2023   PLT 132 (L) 04/11/2023      Chemistry      Component Value Date/Time   NA 135 04/06/2023 1024   NA 143 11/02/2022 1454   K 4.1 04/06/2023 1024   CL 98 04/06/2023 1024   CO2 26 04/06/2023 1024   BUN 16 04/06/2023 1024   BUN 31 (H) 11/02/2022 1454   CREATININE 1.98 (H) 04/06/2023 1024   CREATININE 1.05 02/24/2015 1601      Component Value Date/Time   CALCIUM  8.7 (L) 04/06/2023 1024   ALKPHOS 75 04/06/2023 1024   AST 13 (L) 04/06/2023 1024   ALT 8 04/06/2023 1024   BILITOT 0.7 04/06/2023 1024   BILITOT <0.2 11/04/2021 1028

## 2023-04-11 NOTE — Assessment & Plan Note (Signed)
He had coronary artery disease and history of bypass surgery We will keep transfusion threshold above 8

## 2023-04-11 NOTE — Assessment & Plan Note (Signed)
The patient has multifactorial anemia, combination of bone marrow suppression from severe endocarditis and antibiotics and anemia chronic renal failure Due to his history of heart disease, he would need blood transfusion to keep his hemoglobin above 8  We discussed some of the risks, benefits, and alternatives of blood transfusions. The patient is symptomatic from anemia and the hemoglobin level is critically low.  Some of the side-effects to be expected including risks of transfusion reactions, chills, infection, syndrome of volume overload and risk of hospitalization from various reasons and the patient is willing to proceed and went ahead to sign consent today. I recommend 2 units of blood He will return in about 2 weeks for further follow-up I have discussed the case with his nephrologist who will be arranging for ESA and IV iron in the dialysis unit

## 2023-04-12 DIAGNOSIS — D631 Anemia in chronic kidney disease: Secondary | ICD-10-CM | POA: Diagnosis not present

## 2023-04-12 DIAGNOSIS — T8249XD Other complication of vascular dialysis catheter, subsequent encounter: Secondary | ICD-10-CM | POA: Diagnosis not present

## 2023-04-12 DIAGNOSIS — Z992 Dependence on renal dialysis: Secondary | ICD-10-CM | POA: Diagnosis not present

## 2023-04-12 DIAGNOSIS — N170 Acute kidney failure with tubular necrosis: Secondary | ICD-10-CM | POA: Diagnosis not present

## 2023-04-14 ENCOUNTER — Ambulatory Visit: Payer: PPO | Admitting: Internal Medicine

## 2023-04-19 DIAGNOSIS — N179 Acute kidney failure, unspecified: Secondary | ICD-10-CM | POA: Diagnosis not present

## 2023-04-19 DIAGNOSIS — N170 Acute kidney failure with tubular necrosis: Secondary | ICD-10-CM | POA: Diagnosis not present

## 2023-04-19 DIAGNOSIS — Z992 Dependence on renal dialysis: Secondary | ICD-10-CM | POA: Diagnosis not present

## 2023-04-19 DIAGNOSIS — T8249XD Other complication of vascular dialysis catheter, subsequent encounter: Secondary | ICD-10-CM | POA: Diagnosis not present

## 2023-04-20 ENCOUNTER — Encounter: Payer: Self-pay | Admitting: Physician Assistant

## 2023-04-20 ENCOUNTER — Ambulatory Visit (HOSPITAL_BASED_OUTPATIENT_CLINIC_OR_DEPARTMENT_OTHER): Payer: PPO | Admitting: Pharmacist

## 2023-04-20 ENCOUNTER — Other Ambulatory Visit: Payer: Self-pay | Admitting: Pharmacist

## 2023-04-20 ENCOUNTER — Ambulatory Visit: Payer: PPO | Attending: Physician Assistant | Admitting: Physician Assistant

## 2023-04-20 ENCOUNTER — Encounter: Payer: Self-pay | Admitting: Pharmacist

## 2023-04-20 VITALS — BP 145/84 | HR 77 | Wt 206.2 lb

## 2023-04-20 DIAGNOSIS — D649 Anemia, unspecified: Secondary | ICD-10-CM

## 2023-04-20 DIAGNOSIS — Z7984 Long term (current) use of oral hypoglycemic drugs: Secondary | ICD-10-CM

## 2023-04-20 DIAGNOSIS — R600 Localized edema: Secondary | ICD-10-CM | POA: Diagnosis not present

## 2023-04-20 DIAGNOSIS — E785 Hyperlipidemia, unspecified: Secondary | ICD-10-CM

## 2023-04-20 DIAGNOSIS — E876 Hypokalemia: Secondary | ICD-10-CM | POA: Diagnosis not present

## 2023-04-20 DIAGNOSIS — N183 Chronic kidney disease, stage 3 unspecified: Secondary | ICD-10-CM

## 2023-04-20 DIAGNOSIS — Z09 Encounter for follow-up examination after completed treatment for conditions other than malignant neoplasm: Secondary | ICD-10-CM | POA: Diagnosis not present

## 2023-04-20 DIAGNOSIS — N186 End stage renal disease: Secondary | ICD-10-CM | POA: Diagnosis not present

## 2023-04-20 DIAGNOSIS — I129 Hypertensive chronic kidney disease with stage 1 through stage 4 chronic kidney disease, or unspecified chronic kidney disease: Secondary | ICD-10-CM

## 2023-04-20 DIAGNOSIS — I25810 Atherosclerosis of coronary artery bypass graft(s) without angina pectoris: Secondary | ICD-10-CM | POA: Diagnosis not present

## 2023-04-20 DIAGNOSIS — E1122 Type 2 diabetes mellitus with diabetic chronic kidney disease: Secondary | ICD-10-CM | POA: Diagnosis not present

## 2023-04-20 DIAGNOSIS — Z79899 Other long term (current) drug therapy: Secondary | ICD-10-CM

## 2023-04-20 DIAGNOSIS — J3089 Other allergic rhinitis: Secondary | ICD-10-CM | POA: Diagnosis not present

## 2023-04-20 MED ORDER — FUROSEMIDE 40 MG PO TABS
ORAL_TABLET | ORAL | 1 refills | Status: DC
Start: 2023-04-20 — End: 2023-06-13

## 2023-04-20 MED ORDER — DAPAGLIFLOZIN PROPANEDIOL 10 MG PO TABS: 10.0000 mg | ORAL_TABLET | Freq: Every day | ORAL | 1 refills | Status: DC

## 2023-04-20 MED ORDER — POTASSIUM CHLORIDE CRYS ER 20 MEQ PO TBCR
EXTENDED_RELEASE_TABLET | ORAL | 2 refills | Status: DC
Start: 2023-04-20 — End: 2023-06-13

## 2023-04-20 MED ORDER — LISINOPRIL 40 MG PO TABS
40.0000 mg | ORAL_TABLET | Freq: Every day | ORAL | 1 refills | Status: DC
Start: 2023-04-20 — End: 2023-06-13

## 2023-04-20 MED ORDER — EZETIMIBE 10 MG PO TABS
10.0000 mg | ORAL_TABLET | Freq: Every day | ORAL | 3 refills | Status: DC
Start: 2023-04-20 — End: 2023-06-13

## 2023-04-20 MED ORDER — AMLODIPINE BESYLATE 10 MG PO TABS
10.0000 mg | ORAL_TABLET | Freq: Every day | ORAL | 0 refills | Status: DC
Start: 2023-04-20 — End: 2023-06-13

## 2023-04-20 MED ORDER — CARVEDILOL 6.25 MG PO TABS
6.2500 mg | ORAL_TABLET | Freq: Two times a day (BID) | ORAL | 2 refills | Status: DC
Start: 2023-04-20 — End: 2023-06-13

## 2023-04-20 MED ORDER — FLUTICASONE PROPIONATE 50 MCG/ACT NA SUSP
1.0000 | Freq: Every day | NASAL | 3 refills | Status: DC
Start: 2023-04-20 — End: 2023-06-13

## 2023-04-20 NOTE — Progress Notes (Signed)
Pharmacy Quality Measure Review  This patient is appearing on a report for being at risk of failing the adherence measure for cholesterol (statin) medications this calendar year.   Medication: atorvastatin 80 mg tablets Last fill date: 04/19/2023 for 90 day supply. Filled prior to that on 01/19/2023. Adherence to statin looks good.   Insurance report was not up to date. No action needed at this time.   Butch Penny, PharmD, [place], CPP Clinical Pharmacist [facility] [phone]

## 2023-04-20 NOTE — Progress Notes (Signed)
Patient ID: Julien Skarda, male   DOB: [birth year], 62 y.o.   MRN: [id number]    Colbie Odoms, is a 62 y.o. male  WNU:[id number]  IHK:[id number]  DOB - July 05, 1961  Chief Complaint  Patient presents with   Hospitalization Follow-up   Medication Management       Subjective:   Anfernee Ola is a 62 y.o. male here today for a follow up visit after being seen in the ED 7/31 for transfusion.  ESRD on HD Tues, Thursday, and Saturday.  He is here today for follow up, RF on some meds, and bc I'm confused about what meds to take.  He was told at dialysis yesterday to increase lasix to 80 mg daily.  He has 2 bottles of carvedilol at different doses.  No complaints today  ED visit 04/06/2023 Khase Kappel is a 62 y.o. male with a history of chronic anemia, chronic kidney disease on dialysis, high blood pressure, presented to the ED with concern for low hemoglobin level.  Patient had outpatient lab work done and had a hemoglobin of 6.0, baseline is typically close to 7.0.  He says he has been mildly winded with exertion the past several days, has no significant symptoms.  He says currently feels "completely fine".  He said he is here because his doctor told him to come in.  He has not missed any dialysis this week and a scheduled to go back tomorrow.  He denies any black or tarry stools.  He reports has had numerous stool test in the past and has never had GI bleeding issues.  He has received blood transfusions in the past for anemia.  He is not on blood thinners or anticoagulation due to bleeding risks.   Most recently was hospitalized in June, approximately 1 month ago, for suspected multifactorial anemia, for which she received a unit of packed red blood cells, and anemia was felt to be likely related to renal disease.  He has been treated prior to that for MSSA bacteremia and septic shock with an extensive course of antibiotics which were completed at the start of July.  He has kidney disease related to ATN,  for which he is on dialysis Monday Wednesday Friday.  Also has a history of A-fib but is not on anticoagulation due to concern for bleeding risk.   This patient presents to the ED with concern for gradually worsening anemia. This involves an extensive number of treatment options, and is a complaint that carries with it a high risk of complications and morbidity.  The differential diagnosis includes multifactorial anemia, most likely related to chronic renal disease.  Less likely an acute GI bleed or hemorrhage.   Patient was consented for 1 unit blood transfusion.  I suspect this likely stabilize his hemoglobin closer to his baseline level 7.0.  Given that he is minimally symptomatic, currently asymptomatic, and I do not believe he is requiring hospitalization.  I suspect this is a slow anemic process, which will need to be monitored as an outpatient.  His hemoglobin has been stable over the past 12 days is 6.0.   Co-morbidities that complicate the patient evaluation: Chronic kidney disease at high risk of anemia and dialysis complication   External records from outside source obtained and reviewed including hospital discharge summary from June as noted above   I ordered and personally interpreted labs.  The pertinent results include:  K 4.1, wnl, Hgb 6.0 (stable from 12 days ago)   I ordered imaging studies including  x-ray of the chest I independently visualized and interpreted imaging which showed no emergent findings I agree with the radiologist interpretation   The patient was maintained on a cardiac monitor.  I personally viewed and interpreted the cardiac monitored which showed an underlying rhythm of: Normal heart rate     I ordered medication including 1 unit blood transfusion after consent with patient   I have reviewed the patients home medicines and have made adjustments as needed   Test Considered: No indication for CT imaging of the abdomen at this time     After the  interventions noted above, I reevaluated the patient and found that they have: improved  Dispostion:   After consideration of the diagnostic results and the patients response to treatment, I feel that the patent would benefit from outpatient follow-up.     No problems updated.  ALLERGIES: Allergies  Allergen Reactions   Other Anaphylaxis    Mushrooms  Not listed on MAR    Plavix [Clopidogrel Bisulfate] Other (See Comments)    TTP Not listed on the [facility]   Fleet Enema [Enema] Other (See Comments)    Unknown reaction   Lovenox [Enoxaparin] Other (See Comments)    Unknown reaction   Morphine Other (See Comments)    Unknown reaction   Nsaids Other (See Comments)    Unknown reaction   Hydrocodone-Acetaminophen Itching    PAST MEDICAL HISTORY: Past Medical History:  Diagnosis Date   Allergy    Anemia    Anxiety    Baker's cyst of knee    Blood transfusion without reported diagnosis    as baby    Coronary artery disease    quadruple bypass - March 2016   GERD (gastroesophageal reflux disease)    Gouty arthritis    "real bad" (01/17/2013)   Heart murmur    Hypercholesteremia    Hypertension    Myocardial infarction (HCC) 2017   Stroke (HCC)    Type II diabetes mellitus (HCC)     MEDICATIONS AT HOME: Prior to Admission medications   Medication Sig Start Date End Date Taking? Authorizing Provider  amLODipine (NORVASC) 10 MG tablet Take 1 tablet (10 mg total) by mouth daily. 04/20/23   Anders Simmonds, PA-C  atorvastatin (LIPITOR) 80 MG tablet TAKE 1 TABLET BY MOUTH ONCE DAILY AT 6 IN THE EVENING Patient taking differently: Take 80 mg by mouth at bedtime. 11/11/22   Marcine Matar, MD  carvedilol (COREG) 6.25 MG tablet Take 1 tablet (6.25 mg total) by mouth 2 (two) times daily. 04/20/23   Anders Simmonds, PA-C  ezetimibe (ZETIA) 10 MG tablet Take 1 tablet (10 mg total) by mouth daily. 04/20/23   Anders Simmonds, PA-C  FARXIGA 10 MG TABS tablet Take 10 mg by mouth  daily. 02/04/22   [provider]  fluticasone (FLONASE) 50 MCG/ACT nasal spray Place 1 spray into both nostrils daily. 04/20/23   Anders Simmonds, PA-C  furosemide (LASIX) 40 MG tablet Per dialysis: 1 to 2 tabs daily depending on swelling(updated dosing) 04/20/23   Anders Simmonds, PA-C  lisinopril (ZESTRIL) 40 MG tablet Take 1 tablet (40 mg total) by mouth daily. 04/20/23   Anders Simmonds, PA-C  potassium chloride SA (KLOR-CON M) 20 MEQ tablet TAKE 1 TABLET BY MOUTH ON MONDAY, Wed, FRIDAY and Saturdays. 04/20/23   Anders Simmonds, PA-C  triamcinolone cream (KENALOG) 0.1 % Apply 1 Application topically daily.    [provider]  ROS: Neg HEENT Neg resp Neg cardiac Neg GI Neg GU Neg MS Neg psych Neg neuro  Objective:   Vitals:   04/20/23 0928  BP: (!) 145/84  Pulse: 77  SpO2: 99%  Weight: 206 lb 3.2 oz (93.5 kg)   Exam General appearance : Awake, alert, not in any distress. Speech Clear. Not toxic looking HEENT: Atraumatic and Normocephalic Neck: Supple, no JVD. No cervical lymphadenopathy.  Chest: Good air entry bilaterally, CTAB.  No rales/rhonchi/wheezing CVS: S1 S2 regular, no murmurs.  Extremities: B/L Lower Ext shows no edema, both legs are warm to touch Neurology: Awake alert, and oriented X 3, CN II-XII intact, Non focal Skin: No Rash  Data Review Lab Results  Component Value Date   HGBA1C 6.2 (H) 02/26/2023   HGBA1C 5.9 12/13/2022   HGBA1C 5.8 08/12/2022    Assessment & Plan   1. Coronary artery disease involving coronary bypass graft of native heart without angina pectoris I spent about 30 mins face to face with him and various bottles.  I threw away his carvedilol 12.5.  I increased his lasix to reflect what he was told yesterday - ezetimibe (ZETIA) 10 MG tablet; Take 1 tablet (10 mg total) by mouth daily.  Dispense: 90 tablet; Refill: 3 - carvedilol (COREG) 6.25 MG tablet; Take 1 tablet (6.25 mg total) by mouth 2 (two) times daily.   Dispense: 180 tablet; Refill: 2  2. Edema of both lower legs - furosemide (LASIX) 40 MG tablet; Per dialysis: 1 to 2 tabs daily depending on swelling(updated dosing)  Dispense: 180 tablet; Refill: 1 - potassium chloride SA (KLOR-CON M) 20 MEQ tablet; TAKE 1 TABLET BY MOUTH ON MONDAY, Wed, FRIDAY and Saturdays.  Dispense: 48 tablet; Refill: 2  3. Hypertension associated with stage 3 chronic kidney disease due to type 2 diabetes mellitus (HCC) - carvedilol (COREG) 6.25 MG tablet; Take 1 tablet (6.25 mg total) by mouth 2 (two) times daily.  Dispense: 180 tablet; Refill: 2 - amLODipine (NORVASC) 10 MG tablet; Take 1 tablet (10 mg total) by mouth daily.  Dispense: 90 tablet; Refill: 0 - lisinopril (ZESTRIL) 40 MG tablet; Take 1 tablet (40 mg total) by mouth daily.  Dispense: 90 tablet; Refill: 1  4. Hypokalemia - potassium chloride SA (KLOR-CON M) 20 MEQ tablet; TAKE 1 TABLET BY MOUTH ON MONDAY, Wed, FRIDAY and Saturdays.  Dispense: 48 tablet; Refill: 2  5. ESRD (end stage renal disease) (HCC) T,TH,S  6. Normocytic anemia S/p transfusion Followed by renal  7. Dyslipidemia - ezetimibe (ZETIA) 10 MG tablet; Take 1 tablet (10 mg total) by mouth daily.  Dispense: 90 tablet; Refill: 3  8. Encounter for examination following treatment at hospital  9. Seasonal allergic rhinitis due to other allergic trigger - fluticasone (FLONASE) 50 MCG/ACT nasal spray; Place 1 spray into both nostrils daily.  Dispense: 48 g; Refill: 3  Provided with updated medication lsit  Return in about 4 months (around 08/20/2023) for Dr Laural Benes for chronic conditions.  The patient was given clear instructions to go to ER or return to medical center if symptoms don't improve, worsen or new problems develop. The patient verbalized understanding. The patient was told to call to get lab results if they haven't heard anything in the next week.      Georgian Co, PA-C [facility]  [place], [place] [phone]   04/20/2023, 11:57 AM

## 2023-04-20 NOTE — Progress Notes (Signed)
04/20/2023 Name: [name] MRN: [id number] DOB: 1961-02-08  No chief complaint on file.   [name] is a 62 y.o. year old male who presented for a telephone visit.   They were referred to the pharmacist by a quality report for assistance in managing medication access. At risk for failing MAD measure.    Subjective:  Care Team: Primary Care Provider: Marcine Matar, MD ; Next Scheduled Visit: none - saw our covering PA today.   Medication Access/Adherence  Current Pharmacy:  [facility] [address] [place], [place] - [address] [place] [address] Phone: [phone] Fax: [phone]   Patient reports affordability concerns with their medications: No  Patient reports access/transportation concerns to their pharmacy: No  Patient reports adherence concerns with their medications:  No     Medication Management:  Current adherence strategy: appropriate - fills 90-day supply at his [facility].   Patient reports Fair adherence to medications  Patient reports the following barriers to adherence: none reported   Recent fill dates:  Farxiga 10 mg daily, filled 03/27/2023 for a 30-day supply.   Objective:  Lab Results  Component Value Date   HGBA1C 6.2 (H) 02/26/2023    Lab Results  Component Value Date   CREATININE 1.98 (H) 04/06/2023   BUN 16 04/06/2023   NA 135 04/06/2023   K 4.1 04/06/2023   CL 98 04/06/2023   CO2 26 04/06/2023    Lab Results  Component Value Date   CHOL 171 11/04/2021   HDL 60 11/04/2021   LDLCALC 90 11/04/2021   TRIG 117 11/04/2021   CHOLHDL 2.9 11/04/2021    Medications Reviewed Today     Reviewed by Drucilla Chalet, RPH-CPP (Pharmacist) on 04/20/23 at 1441  Med List Status: <None>   Medication Order Taking? Sig Documenting Provider Last Dose Status Informant  amLODipine (NORVASC) 10 MG tablet [id number]  Take 1 tablet (10 mg total) by mouth daily. Georgian Co M,  [place]  Active   atorvastatin (LIPITOR) 80 MG tablet [id number] No TAKE 1 TABLET BY MOUTH ONCE DAILY AT 6 IN THE EVENING  Patient taking differently: Take 80 mg by mouth at bedtime.   Marcine Matar, MD Taking Active Nursing Home Medication Administration Guide (MAG)  carvedilol (COREG) 6.25 MG tablet [id number]  Take 1 tablet (6.25 mg total) by mouth 2 (two) times daily. Georgian Co M, PA-C  Active   ezetimibe (ZETIA) 10 MG tablet [id number]  Take 1 tablet (10 mg total) by mouth daily. Anders Simmonds, PA-C  Active   FARXIGA 10 MG TABS tablet [id number] No Take 10 mg by mouth daily. [provider] Taking Active Nursing Home Medication Administration Guide (MAG)  fluticasone (FLONASE) 50 MCG/ACT nasal spray [id number]  Place 1 spray into both nostrils daily. Georgian Co M, PA-C  Active   furosemide (LASIX) 40 MG tablet [id number]  Per dialysis: 1 to 2 tabs daily depending on swelling(updated dosing) Anders Simmonds, PA-C  Active   lisinopril (ZESTRIL) 40 MG tablet [id number]  Take 1 tablet (40 mg total) by mouth daily. Georgian Co M, PA-C  Active   potassium chloride SA (KLOR-CON M) 20 MEQ tablet [id number]  TAKE 1 TABLET BY MOUTH ON MONDAY, Wed, FRIDAY and Saturdays. Georgian Co M, [place]  Active   triamcinolone cream (KENALOG) 0.1 % [id number]  Apply 1 Application topically daily. [provider]  Active   Med List Note Larrie Kass, CPhT 02/26/23 0236):  [address] [phone]              Assessment/Plan:   Medication Management: - Currently strategy sufficient to maintain appropriate adherence to prescribed medication regimen - Suggested use of weekly pill box to organize medications - Created list of medication, indication, and administration time. Provided to patient - Resent Farxiga rxn to pt's [facility] for 90-day supply.   Follow Up Plan: w/ PCP.   Butch Penny, PharmD, Patsy Baltimore, CPP Clinical Pharmacist [facility] [phone]

## 2023-04-22 ENCOUNTER — Inpatient Hospital Stay: Payer: PPO

## 2023-04-22 ENCOUNTER — Inpatient Hospital Stay (HOSPITAL_BASED_OUTPATIENT_CLINIC_OR_DEPARTMENT_OTHER): Payer: PPO | Admitting: Hematology and Oncology

## 2023-04-22 ENCOUNTER — Other Ambulatory Visit: Payer: Self-pay

## 2023-04-22 ENCOUNTER — Encounter: Payer: Self-pay | Admitting: Hematology and Oncology

## 2023-04-22 VITALS — BP 122/75 | HR 71 | Temp 97.6°F | Resp 18 | Ht 66.0 in | Wt 206.4 lb

## 2023-04-22 DIAGNOSIS — D649 Anemia, unspecified: Secondary | ICD-10-CM

## 2023-04-22 DIAGNOSIS — D539 Nutritional anemia, unspecified: Secondary | ICD-10-CM | POA: Diagnosis not present

## 2023-04-22 DIAGNOSIS — Z992 Dependence on renal dialysis: Secondary | ICD-10-CM

## 2023-04-22 DIAGNOSIS — N186 End stage renal disease: Secondary | ICD-10-CM

## 2023-04-22 LAB — CBC WITH DIFFERENTIAL/PLATELET
Abs Immature Granulocytes: 0.06 10*3/uL (ref 0.00–0.07)
Basophils Absolute: 0 10*3/uL (ref 0.0–0.1)
Basophils Relative: 0 %
Eosinophils Absolute: 0 10*3/uL (ref 0.0–0.5)
Eosinophils Relative: 0 %
HCT: 22.4 % — ABNORMAL LOW (ref 39.0–52.0)
Hemoglobin: 7.1 g/dL — ABNORMAL LOW (ref 13.0–17.0)
Immature Granulocytes: 1 %
Lymphocytes Relative: 27 %
Lymphs Abs: 2.1 10*3/uL (ref 0.7–4.0)
MCH: 29.7 pg (ref 26.0–34.0)
MCHC: 31.7 g/dL (ref 30.0–36.0)
MCV: 93.7 fL (ref 80.0–100.0)
Monocytes Absolute: 3.2 10*3/uL — ABNORMAL HIGH (ref 0.1–1.0)
Monocytes Relative: 39 %
Neutro Abs: 2.6 10*3/uL (ref 1.7–7.7)
Neutrophils Relative %: 33 %
Platelets: 128 10*3/uL — ABNORMAL LOW (ref 150–400)
RBC: 2.39 MIL/uL — ABNORMAL LOW (ref 4.22–5.81)
RDW: 22.2 % — ABNORMAL HIGH (ref 11.5–15.5)
Smear Review: NORMAL
WBC: 8 10*3/uL (ref 4.0–10.5)
nRBC: 0 % (ref 0.0–0.2)

## 2023-04-22 LAB — SAMPLE TO BLOOD BANK

## 2023-04-22 LAB — PREPARE RBC (CROSSMATCH)

## 2023-04-22 MED ORDER — DIPHENHYDRAMINE HCL 25 MG PO CAPS
25.0000 mg | ORAL_CAPSULE | Freq: Once | ORAL | Status: AC
  Administered 2023-04-22: 25 mg via ORAL
  Filled 2023-04-22: qty 1

## 2023-04-22 MED ORDER — SODIUM CHLORIDE 0.9% IV SOLUTION
250.0000 mL | Freq: Once | INTRAVENOUS | Status: AC
  Administered 2023-04-22: 250 mL via INTRAVENOUS

## 2023-04-22 MED ORDER — ACETAMINOPHEN 325 MG PO TABS
650.0000 mg | ORAL_TABLET | Freq: Once | ORAL | Status: AC
  Administered 2023-04-22: 650 mg via ORAL
  Filled 2023-04-22: qty 2

## 2023-04-22 NOTE — Progress Notes (Signed)
[facility] OFFICE PROGRESS NOTE  Marcine Matar, MD  ASSESSMENT & PLAN:  Deficiency anemia The patient has multifactorial anemia, combination of bone marrow suppression from severe endocarditis and antibiotics and anemia chronic renal failure Due to his history of heart disease, he would need blood transfusion to keep his hemoglobin above 8  We discussed some of the risks, benefits, and alternatives of blood transfusions. The patient is symptomatic from anemia and the hemoglobin level is critically low.  Some of the side-effects to be expected including risks of transfusion reactions, chills, infection, syndrome of volume overload and risk of hospitalization from various reasons and the patient is willing to proceed and went ahead to sign consent today. I recommend 2 units of blood He will return in about 2 weeks for further follow-up I have discussed the case with his nephrologist who will be arranging for ESA and IV iron in the dialysis unit   ESRD (end stage renal disease) on dialysis [facility]) It appears that his renal function is improving It is possible that the patient will start dialysis in the near future If he stops hemodialysis, I will prescribe erythropoietin stimulating agents here in the future  Orders Placed This Encounter  Procedures   Informed Consent Details: Physician/Practitioner Attestation; Transcribe to consent form and obtain patient signature    Standing Status:   Future    Standing Expiration Date:   04/21/2024    Order Specific Question:   Physician/Practitioner attestation of informed consent for blood and or blood product transfusion    Answer:   I, the physician/practitioner, attest that I have discussed with the patient the benefits, risks, side effects, alternatives, likelihood of achieving goals and potential problems during recovery for the procedure that I have provided informed consent.    Order Specific Question:   Product(s)     Answer:   All Product(s)   Care order/instruction    Transfuse Parameters    Standing Status:   Future    Standing Expiration Date:   04/21/2024   Type and screen         Standing Status:   Future    Number of Occurrences:   1    Standing Expiration Date:   04/21/2024   Prepare RBC (crossmatch)    Standing Status:   Standing    Number of Occurrences:   1    Order Specific Question:   # of Units    Answer:   2 units    Order Specific Question:   Transfusion Indications    Answer:   Hemoglobin 8 gm/dL or less and orthopedic or cardiac surgery or pre-existing cardiac condition    Order Specific Question:   Number of Units to Keep Ahead    Answer:   NO units ahead    Order Specific Question:   Instructions:    Answer:   Transfuse    Order Specific Question:   If emergent release call blood bank    Answer:   Not emergent release    The total time spent in the appointment was 30 minutes encounter with patients including review of chart and various tests results, discussions about plan of care and coordination of care plan   All questions were answered. The patient knows to call the clinic with any problems, questions or concerns. No barriers to learning was detected.    Artis Delay, MD 8/16/202411:02 AM  INTERVAL HISTORY: [name] 62 y.o. male returns for further follow-up and transfusion  support The patient has been getting blood transfusion support in the setting of severe anemia, secondary to recent bacteremia/endocarditis and end-stage renal disease He felt better with recent transfusion Denies chest pain or shortness of breath The patient stated that he might be able to start hemodialysis soon The patient denies any recent signs or symptoms of bleeding such as spontaneous epistaxis, hematuria or hematochezia.   SUMMARY OF HEMATOLOGIC HISTORY:  This is my initial dictation from Jan 29, 2023 for further details He was seen in the hospital due to constellation of symptoms  worrisome for TTP The patient was intubated by the time I rounded on him this morning.  His son and significant other present in the waiting room.  I have reviewed the case with ICU team as well as nephrologist this morning I have reviewed his chart extensively The patient have significant background history of coronary artery disease, stroke, diabetes, hypertension, hyperlipidemia, chronic kidney disease and others.  The patient has been on Plavix for a very long time, since 2019 after presentation with stroke. Approximately a month ago, he was noted to have acute on chronic renal failure and was referred to cardiologist for follow-up.  He also have recent acute gout. Yesterday, he was brought into the emergency department.  His significant other noticed that the patient have acute confusion, shortness of breath, diaphoresis as well as near syncopal episode Soon after arrival to the ER, he has rapid deterioration with respiratory failure, profound abnormal labs, and altered mental status.  He was intubated. There were reported history of fever.  Sepsis was also was in the differential and significant cultures were drawn. His baseline CBC from August 12, 2022 showed hemoglobin of 10.2, platelet count of 252 and creatinine of 1.94 Late last night, his white count was 9.6, hemoglobin was 6.8, MCV was high at 115.7 and platelet count of 121.  Complete metabolic panel revealed creatinine of 4.24 with elevated serum bilirubin of 1.9.  His PT APTT and INR were also abnormal.  Fibrinogen level was adequate.  He is noted to have elevated D-dimer.  LDH was elevated at 597 Schistocytes were noted on peripheral smear.  Diagnosis of TTP was suspected.  The ICU team placed central line urgently. There were no reported signs of bleeding by the nursing staff The patient was subsequently transferred to [facility] when he was found to have bacteremia  I have reviewed the past medical history, past surgical history,  social history and family history with the patient and they are unchanged from previous note.  ALLERGIES:  is allergic to other, plavix [clopidogrel bisulfate], fleet enema [enema], lovenox [enoxaparin], morphine, nsaids, and hydrocodone-acetaminophen.  MEDICATIONS:  Current Outpatient Medications  Medication Sig Dispense Refill   amLODipine (NORVASC) 10 MG tablet Take 1 tablet (10 mg total) by mouth daily. 90 tablet 0   atorvastatin (LIPITOR) 80 MG tablet TAKE 1 TABLET BY MOUTH ONCE DAILY AT 6 IN THE EVENING (Patient taking differently: Take 80 mg by mouth at bedtime.) 90 tablet 3   carvedilol (COREG) 6.25 MG tablet Take 1 tablet (6.25 mg total) by mouth 2 (two) times daily. 180 tablet 2   dapagliflozin propanediol (FARXIGA) 10 MG TABS tablet Take 1 tablet (10 mg total) by mouth daily. 90 tablet 1   ezetimibe (ZETIA) 10 MG tablet Take 1 tablet (10 mg total) by mouth daily. 90 tablet 3   fluticasone (FLONASE) 50 MCG/ACT nasal spray Place 1 spray into both nostrils daily. 48 g 3   furosemide (  LASIX) 40 MG tablet Per dialysis: 1 to 2 tabs daily depending on swelling(updated dosing) 180 tablet 1   lisinopril (ZESTRIL) 40 MG tablet Take 1 tablet (40 mg total) by mouth daily. 90 tablet 1   potassium chloride SA (KLOR-CON M) 20 MEQ tablet TAKE 1 TABLET BY MOUTH ON MONDAY, Wed, FRIDAY and Saturdays. 48 tablet 2   triamcinolone cream (KENALOG) 0.1 % Apply 1 Application topically daily.     No current facility-administered medications for this visit.     REVIEW OF SYSTEMS:   Constitutional: Denies fevers, chills or night sweats Eyes: Denies blurriness of vision Ears, nose, mouth, throat, and face: Denies mucositis or sore throat Respiratory: Denies cough, dyspnea or wheezes Cardiovascular: Denies palpitation, chest discomfort or lower extremity swelling Gastrointestinal:  Denies nausea, heartburn or change in bowel habits Skin: Denies abnormal skin rashes Lymphatics: Denies new lymphadenopathy or  easy bruising Neurological:Denies numbness, tingling or new weaknesses Behavioral/Psych: Mood is stable, no new changes  All other systems were reviewed with the patient and are negative.  PHYSICAL EXAMINATION: ECOG PERFORMANCE STATUS: 1 - Symptomatic but completely ambulatory  Vitals:   04/22/23 1054  BP: 122/75  Pulse: 71  Resp: 18  Temp: 97.6 F (36.4 C)  SpO2: 100%   Filed Weights   04/22/23 1054  Weight: 206 lb 6.4 oz (93.6 kg)    GENERAL:alert, no distress and comfortable NEURO: alert & oriented x 3 with fluent speech, no focal motor/sensory deficits  LABORATORY DATA:  I have reviewed the data as listed     Component Value Date/Time   NA 135 04/06/2023 1024   NA 143 11/02/2022 1454   K 4.1 04/06/2023 1024   CL 98 04/06/2023 1024   CO2 26 04/06/2023 1024   GLUCOSE 125 (H) 04/06/2023 1024   BUN 16 04/06/2023 1024   BUN 31 (H) 11/02/2022 1454   CREATININE 1.98 (H) 04/06/2023 1024   CREATININE 1.05 02/24/2015 1601   CALCIUM 8.7 (L) 04/06/2023 1024   PROT 7.7 04/06/2023 1024   PROT 6.8 11/04/2021 1028   ALBUMIN 2.6 (L) 04/06/2023 1024   ALBUMIN 3.9 11/04/2021 1028   AST 13 (L) 04/06/2023 1024   ALT 8 04/06/2023 1024   ALKPHOS 75 04/06/2023 1024   BILITOT 0.7 04/06/2023 1024   BILITOT <0.2 11/04/2021 1028   GFRNONAA 37 (L) 04/06/2023 1024   GFRNONAA 80 02/24/2015 1601   GFRAA 62 10/09/2020 1520   GFRAA >89 02/24/2015 1601    No results found for: "SPEP", "UPEP"  Lab Results  Component Value Date   WBC 8.0 04/22/2023   NEUTROABS PENDING 04/22/2023   HGB 7.1 (L) 04/22/2023   HCT 22.4 (L) 04/22/2023   MCV 93.7 04/22/2023   PLT 128 (L) 04/22/2023      Chemistry      Component Value Date/Time   NA 135 04/06/2023 1024   NA 143 11/02/2022 1454   K 4.1 04/06/2023 1024   CL 98 04/06/2023 1024   CO2 26 04/06/2023 1024   BUN 16 04/06/2023 1024   BUN 31 (H) 11/02/2022 1454   CREATININE 1.98 (H) 04/06/2023 1024   CREATININE 1.05 02/24/2015 1601       Component Value Date/Time   CALCIUM 8.7 (L) 04/06/2023 1024   ALKPHOS 75 04/06/2023 1024   AST 13 (L) 04/06/2023 1024   ALT 8 04/06/2023 1024   BILITOT 0.7 04/06/2023 1024   BILITOT <0.2 11/04/2021 1028

## 2023-04-22 NOTE — Assessment & Plan Note (Signed)
It appears that his renal function is improving It is possible that the patient will start dialysis in the near future If he stops hemodialysis, I will prescribe erythropoietin stimulating agents here in the future

## 2023-04-22 NOTE — Assessment & Plan Note (Signed)
 The patient has multifactorial anemia, combination of bone marrow suppression from severe endocarditis and antibiotics and anemia chronic renal failure Due to his history of heart disease, he would need blood transfusion to keep his hemoglobin above 8  We discussed some of the risks, benefits, and alternatives of blood transfusions. The patient is symptomatic from anemia and the hemoglobin level is critically low.  Some of the side-effects to be expected including risks of transfusion reactions, chills, infection, syndrome of volume overload and risk of hospitalization from various reasons and the patient is willing to proceed and went ahead to sign consent today. I recommend 2 units of blood He will return in about 2 weeks for further follow-up I have discussed the case with his nephrologist who will be arranging for ESA and IV iron in the dialysis unit

## 2023-04-22 NOTE — Patient Instructions (Signed)

## 2023-04-23 LAB — TYPE AND SCREEN
ABO/RH(D): B POS
Antibody Screen: NEGATIVE
Unit division: 0
Unit division: 0

## 2023-04-23 LAB — BPAM RBC
Blood Product Expiration Date: 202409162359
Blood Product Expiration Date: 202409162359
ISSUE DATE / TIME: 202408161140
ISSUE DATE / TIME: 202408161140
Unit Type and Rh: 7300
Unit Type and Rh: 7300

## 2023-04-26 DIAGNOSIS — T8249XD Other complication of vascular dialysis catheter, subsequent encounter: Secondary | ICD-10-CM | POA: Diagnosis not present

## 2023-04-26 DIAGNOSIS — N179 Acute kidney failure, unspecified: Secondary | ICD-10-CM | POA: Diagnosis not present

## 2023-04-26 DIAGNOSIS — N170 Acute kidney failure with tubular necrosis: Secondary | ICD-10-CM | POA: Diagnosis not present

## 2023-04-26 DIAGNOSIS — D631 Anemia in chronic kidney disease: Secondary | ICD-10-CM | POA: Diagnosis not present

## 2023-04-26 DIAGNOSIS — Z992 Dependence on renal dialysis: Secondary | ICD-10-CM | POA: Diagnosis not present

## 2023-04-29 DIAGNOSIS — K838 Other specified diseases of biliary tract: Secondary | ICD-10-CM | POA: Diagnosis not present

## 2023-04-29 DIAGNOSIS — Z79899 Other long term (current) drug therapy: Secondary | ICD-10-CM | POA: Diagnosis not present

## 2023-04-29 DIAGNOSIS — I2510 Atherosclerotic heart disease of native coronary artery without angina pectoris: Secondary | ICD-10-CM | POA: Diagnosis not present

## 2023-04-29 DIAGNOSIS — Z951 Presence of aortocoronary bypass graft: Secondary | ICD-10-CM | POA: Diagnosis not present

## 2023-04-29 DIAGNOSIS — Z4659 Encounter for fitting and adjustment of other gastrointestinal appliance and device: Secondary | ICD-10-CM | POA: Diagnosis not present

## 2023-04-29 DIAGNOSIS — K8070 Calculus of gallbladder and bile duct without cholecystitis without obstruction: Secondary | ICD-10-CM | POA: Diagnosis not present

## 2023-04-29 DIAGNOSIS — Z9889 Other specified postprocedural states: Secondary | ICD-10-CM | POA: Diagnosis not present

## 2023-04-29 DIAGNOSIS — K5080 Crohn's disease of both small and large intestine without complications: Secondary | ICD-10-CM | POA: Diagnosis not present

## 2023-04-29 DIAGNOSIS — I120 Hypertensive chronic kidney disease with stage 5 chronic kidney disease or end stage renal disease: Secondary | ICD-10-CM | POA: Diagnosis not present

## 2023-04-29 DIAGNOSIS — M3119 Other thrombotic microangiopathy: Secondary | ICD-10-CM | POA: Diagnosis not present

## 2023-04-29 DIAGNOSIS — Z8673 Personal history of transient ischemic attack (TIA), and cerebral infarction without residual deficits: Secondary | ICD-10-CM | POA: Diagnosis not present

## 2023-04-29 DIAGNOSIS — I469 Cardiac arrest, cause unspecified: Secondary | ICD-10-CM | POA: Diagnosis not present

## 2023-04-29 DIAGNOSIS — N186 End stage renal disease: Secondary | ICD-10-CM | POA: Diagnosis not present

## 2023-04-29 DIAGNOSIS — E1122 Type 2 diabetes mellitus with diabetic chronic kidney disease: Secondary | ICD-10-CM | POA: Diagnosis not present

## 2023-05-01 ENCOUNTER — Encounter: Payer: Self-pay | Admitting: Cardiovascular Disease

## 2023-05-01 NOTE — Progress Notes (Unsigned)
Cardiology Office Note:    Date:  05/01/2023   ID:  [name], DOB [birth year], MRN [id number]  PCP:  Marcine Matar, MD  Cardiologist:  Kristeen Miss, MD  Electrophysiologist:  None   Referring MD: Melvyn Novas, PA*    Problem list 1.  Coronary artery disease -status post coronary artery bypass grafting 2.  Aortic stenosis-status post valve replacement 3.  Hypertension 4.  Diabetes mellitus 5.    Previous CVA 6.  Paroxysmal atrial fibrillation  Chief Complaint  Patient presents with   Coronary Artery Disease            [name] is a 62 y.o. male with a hx of coronary artery disease and aortic valve replacement.  He apparently had a left brain TIA.  He has  on dual antiplatelet therapy.  He is currently on Plavix.[name]  History of coronary artery bypass grafting and aortic valve replacement on November 25, 2015:  Median Sternotomy Extracorporeal circulation 3.   Coronary artery bypass grafting x 4   Left internal mammary graft to the LAD SVG to diagonal SVG to Om SVG to RCA 4.   Endoscopic vein harvest from the right leg 5.   Aortic valve replacement using a 23 mm Edwards Magna-Ease pericardial valve  His blood pressure was a little high when he saw Lawson Fiscal back in early September.  She doubled his lisinopril to 5 mg twice a day.  He is now feeling well.  Blood pressures back down to normal.  Try to work on a diet to lose weight. Is exercising   Feb. 14, 2022: [name] is seen for follow up of his CAD , CABG and AVR  BP is elevated .   Has not had his meds today ( avoiding lasix since he had doctors appt.  No CP . Exercises Has lost weight  Today 207 lbs.  Is seeing nephrology - has proteinuria  Is still eating bologna .    Aug. 26, 2024   [name] is seen for follow up of his CAD HTN   Past Medical History:  Diagnosis Date   Allergy    Anemia    Anxiety    Baker's cyst of knee    Blood transfusion without reported diagnosis    as baby     Coronary artery disease    quadruple bypass - March 2016   GERD (gastroesophageal reflux disease)    Gouty arthritis    "real bad" (01/17/2013)   Heart murmur    Hypercholesteremia    Hypertension    Myocardial infarction (HCC) 2017   Stroke (HCC)    Type II diabetes mellitus (HCC)     Past Surgical History:  Procedure Laterality Date   AORTIC VALVE REPLACEMENT N/A 11/25/2015   Procedure: AORTIC VALVE REPLACEMENT (AVR) USING A MAGNA EASE ;  Surgeon: Alleen Borne, MD;  Location: [facility] OR;  Service: Open Heart Surgery;  Laterality: N/A;   CARDIAC CATHETERIZATION N/A 11/19/2015   Procedure: Right/Left Heart Cath and Coronary Angiography;  Surgeon: Lyn Records, MD;  Location: [facility] INVASIVE CV LAB;  Service: Cardiovascular;  Laterality: N/A;   COLONOSCOPY  2004   CORONARY ARTERY BYPASS GRAFT N/A 11/25/2015   Procedure: CORONARY ARTERY BYPASS GRAFTING (CABG)x4 LIMA-LAD; SVG-OM; SVG-RCA; SVG-DIAG;  Surgeon: Alleen Borne, MD;  Location: [facility] OR;  Service: Open Heart Surgery;  Laterality: N/A;   CYST REMOVAL TRUNK N/A 08/24/2016   Procedure: EXCISION OF BACK ABSCESS;  Surgeon: Jimmye Norman, MD;  Location: [facility];  Service: General;  Laterality: N/A;   FINGER AMPUTATION Right 1980's   3rd & 4th digits "got them mashed" (01/17/2013)   KNEE ARTHROSCOPY Right 1980's   MULTIPLE EXTRACTIONS WITH ALVEOLOPLASTY Bilateral 11/21/2015   Procedure: Extraction of tooth #'s 2,12 with alveoloplasty and gross debridement of remaining dentition;  Surgeon: Charlynne Pander, DDS;  Location: [facility] OR;  Service: Oral Surgery;  Laterality: Bilateral;   TEE WITHOUT CARDIOVERSION N/A 11/20/2015   Procedure: TRANSESOPHAGEAL ECHOCARDIOGRAM (TEE);  Surgeon: Laurey Morale, MD;  Location: [facility] ENDOSCOPY;  Service: Cardiovascular;  Laterality: N/A;   TEE WITHOUT CARDIOVERSION N/A 11/25/2015   Procedure: TRANSESOPHAGEAL ECHOCARDIOGRAM (TEE);  Surgeon: Alleen Borne, MD;  Location: [facility] OR;  Service: Open Heart  Surgery;  Laterality: N/A;    Current Medications: No outpatient medications have been marked as taking for the 05/02/23 encounter (Office Visit) with Aydyn Testerman, Deloris Ping, MD.     Allergies:   Other, Plavix [clopidogrel bisulfate], Fleet enema [enema], Lovenox [enoxaparin], Morphine, Nsaids, and Hydrocodone-acetaminophen   Social History   Socioeconomic History   Marital status: Married    Spouse name: Not on file   Number of children: Not on file   Years of education: Not on file   Highest education level: Not on file  Occupational History   Not on file  Tobacco Use   Smoking status: Never   Smokeless tobacco: Never  Vaping Use   Vaping status: Never Used  Substance and Sexual Activity   Alcohol use: Not Currently    Comment: occasionally   Drug use: Not Currently    Types: Marijuana    Comment: pt states occasionally   Sexual activity: Yes  Other Topics Concern   Not on file  Social History Narrative   Not on file   Social Determinants of Health   Financial Resource Strain: Low Risk  (12/09/2022)   Overall Financial Resource Strain (CARDIA)    Difficulty of Paying Living Expenses: Not hard at all  Food Insecurity: No Food Insecurity (02/26/2023)   Hunger Vital Sign    Worried About Running Out of Food in the Last Year: Never true    Ran Out of Food in the Last Year: Never true  Transportation Needs: No Transportation Needs (02/26/2023)   PRAPARE - Transportation    Lack of Transportation (Medical): No    Lack of Transportation (Non-Medical): No  Physical Activity: Inactive (12/09/2022)   Exercise Vital Sign    Days of Exercise per Week: 0 days    Minutes of Exercise per Session: 0 min  Stress: No Stress Concern Present (12/09/2022)   [employer] of Occupational Health - Occupational Stress Questionnaire    Feeling of Stress : Not at all  Social Connections: Socially Isolated (11/11/2022)   Social Connection and Isolation Panel [NHANES]    Frequency of Communication  with Friends and Family: Never    Frequency of Social Gatherings with Friends and Family: Never    Attends Religious Services: Never    Database administrator or Organizations: No    Attends Banker Meetings: Never    Marital Status: Widowed     Family History: The patient's family history includes Diabetes in his father; Hypertension in his brother, brother, brother, father, mother, and sister. There is no history of Colon cancer, Esophageal cancer, Rectal cancer, Stomach cancer, or Colon polyps.  ROS:      EKGs/Labs/Other Studies Reviewed:    The following studies were reviewed  today:     Recent Labs: 02/28/2023: Magnesium 1.8 04/06/2023: ALT 8; BUN 16; Creatinine, Ser 1.98; Potassium 4.1; Sodium 135 04/22/2023: Hemoglobin 7.1; Platelets 128  Recent Lipid Panel    Component Value Date/Time   CHOL 171 11/04/2021 1028   TRIG 117 11/04/2021 1028   HDL 60 11/04/2021 1028   CHOLHDL 2.9 11/04/2021 1028   CHOLHDL 5.4 12/11/2015 0609   VLDL 35 12/11/2015 0609   LDLCALC 90 11/04/2021 1028    Physical Exam:    Physical Exam: There were no vitals taken for this visit.  No BP recorded.  {Refresh Note OR Click here to enter BP  :1}***    GEN:  Well nourished, well developed in no acute distress HEENT: Normal NECK: No JVD; No carotid bruits LYMPHATICS: No lymphadenopathy CARDIAC: RRR ***, no murmurs, rubs, gallops RESPIRATORY:  Clear to auscultation without rales, wheezing or rhonchi  ABDOMEN: Soft, non-tender, non-distended MUSCULOSKELETAL:  No edema; No deformity  SKIN: Warm and dry NEUROLOGIC:  Alert and oriented x 3     EKG:          ASSESSMENT:    No diagnosis found.  PLAN:      Coronary artery disease:     2.  Hyperlipidemia:     3.  History of TIA:   4.  HTN:     5.  CKD:      Medication Adjustments/Labs and Tests Ordered: Current medicines are reviewed at length with the patient today.  Concerns regarding medicines are outlined  above.  No orders of the defined types were placed in this encounter.  No orders of the defined types were placed in this encounter.   There are no Patient Instructions on file for this visit.   Signed, Kristeen Miss, MD  05/01/2023 12:28 PM    [facility]

## 2023-05-02 ENCOUNTER — Ambulatory Visit: Payer: PPO | Attending: Cardiovascular Disease | Admitting: Cardiovascular Disease

## 2023-05-03 DIAGNOSIS — Z992 Dependence on renal dialysis: Secondary | ICD-10-CM | POA: Diagnosis not present

## 2023-05-03 DIAGNOSIS — N170 Acute kidney failure with tubular necrosis: Secondary | ICD-10-CM | POA: Diagnosis not present

## 2023-05-03 DIAGNOSIS — N179 Acute kidney failure, unspecified: Secondary | ICD-10-CM | POA: Diagnosis not present

## 2023-05-03 DIAGNOSIS — T8249XD Other complication of vascular dialysis catheter, subsequent encounter: Secondary | ICD-10-CM | POA: Diagnosis not present

## 2023-05-06 ENCOUNTER — Inpatient Hospital Stay: Payer: PPO

## 2023-05-06 ENCOUNTER — Inpatient Hospital Stay: Payer: PPO | Admitting: Hematology and Oncology

## 2023-05-06 ENCOUNTER — Encounter: Payer: Self-pay | Admitting: Hematology and Oncology

## 2023-05-06 VITALS — BP 114/69 | HR 79 | Temp 97.4°F | Resp 18 | Ht 66.0 in | Wt 203.8 lb

## 2023-05-06 DIAGNOSIS — D539 Nutritional anemia, unspecified: Secondary | ICD-10-CM

## 2023-05-06 DIAGNOSIS — Z992 Dependence on renal dialysis: Secondary | ICD-10-CM

## 2023-05-06 DIAGNOSIS — Z951 Presence of aortocoronary bypass graft: Secondary | ICD-10-CM | POA: Diagnosis not present

## 2023-05-06 DIAGNOSIS — D649 Anemia, unspecified: Secondary | ICD-10-CM

## 2023-05-06 DIAGNOSIS — N186 End stage renal disease: Secondary | ICD-10-CM | POA: Diagnosis not present

## 2023-05-06 LAB — SAMPLE TO BLOOD BANK

## 2023-05-06 LAB — CBC WITH DIFFERENTIAL/PLATELET
Abs Immature Granulocytes: 0.08 10*3/uL — ABNORMAL HIGH (ref 0.00–0.07)
Basophils Absolute: 0 10*3/uL (ref 0.0–0.1)
Basophils Relative: 0 %
Eosinophils Absolute: 0 10*3/uL (ref 0.0–0.5)
Eosinophils Relative: 0 %
HCT: 24.3 % — ABNORMAL LOW (ref 39.0–52.0)
Hemoglobin: 7.8 g/dL — ABNORMAL LOW (ref 13.0–17.0)
Immature Granulocytes: 1 %
Lymphocytes Relative: 26 %
Lymphs Abs: 2.3 10*3/uL (ref 0.7–4.0)
MCH: 30 pg (ref 26.0–34.0)
MCHC: 32.1 g/dL (ref 30.0–36.0)
MCV: 93.5 fL (ref 80.0–100.0)
Monocytes Absolute: 3.3 10*3/uL — ABNORMAL HIGH (ref 0.1–1.0)
Monocytes Relative: 37 %
Neutro Abs: 3.2 10*3/uL (ref 1.7–7.7)
Neutrophils Relative %: 36 %
Platelets: 115 10*3/uL — ABNORMAL LOW (ref 150–400)
RBC: 2.6 MIL/uL — ABNORMAL LOW (ref 4.22–5.81)
RDW: 21.1 % — ABNORMAL HIGH (ref 11.5–15.5)
Smear Review: NORMAL
WBC: 9 10*3/uL (ref 4.0–10.5)
nRBC: 0 % (ref 0.0–0.2)

## 2023-05-06 LAB — PREPARE RBC (CROSSMATCH)

## 2023-05-06 MED ORDER — DIPHENHYDRAMINE HCL 25 MG PO CAPS
25.0000 mg | ORAL_CAPSULE | Freq: Once | ORAL | Status: AC
  Administered 2023-05-06: 25 mg via ORAL
  Filled 2023-05-06: qty 1

## 2023-05-06 MED ORDER — SODIUM CHLORIDE 0.9% IV SOLUTION
250.0000 mL | Freq: Once | INTRAVENOUS | Status: AC
  Administered 2023-05-06: 250 mL via INTRAVENOUS

## 2023-05-06 NOTE — Patient Instructions (Signed)

## 2023-05-06 NOTE — Assessment & Plan Note (Signed)
He is doing well and he has signs of kidney function recovery He might discontinue hemodialysis soon Previously, I have discussed the case with his nephrologist who will be arranging for ESA and IV iron in the dialysis unit However, if the patient stopped receiving hemodialysis, I will arrange for ESA and IV iron here

## 2023-05-06 NOTE — Assessment & Plan Note (Signed)
The patient has multifactorial anemia, combination of bone marrow suppression from severe endocarditis and antibiotics and anemia chronic renal failure Due to his history of heart disease, he would need blood transfusion to keep his hemoglobin above 8  We discussed some of the risks, benefits, and alternatives of blood transfusions. The patient is symptomatic from anemia and the hemoglobin level is critically low.  Some of the side-effects to be expected including risks of transfusion reactions, chills, infection, syndrome of volume overload and risk of hospitalization from various reasons and the patient is willing to proceed and went ahead to sign consent today. I recommend 1 unit of blood He will return in about 2 weeks for further follow-up

## 2023-05-06 NOTE — Assessment & Plan Note (Signed)
 He had coronary artery disease and history of bypass surgery We will keep transfusion threshold above 8

## 2023-05-06 NOTE — Progress Notes (Signed)
[facility] OFFICE PROGRESS NOTE  Marcine Matar, MD  ASSESSMENT & PLAN:  Deficiency anemia The patient has multifactorial anemia, combination of bone marrow suppression from severe endocarditis and antibiotics and anemia chronic renal failure Due to his history of heart disease, he would need blood transfusion to keep his hemoglobin above 8  We discussed some of the risks, benefits, and alternatives of blood transfusions. The patient is symptomatic from anemia and the hemoglobin level is critically low.  Some of the side-effects to be expected including risks of transfusion reactions, chills, infection, syndrome of volume overload and risk of hospitalization from various reasons and the patient is willing to proceed and went ahead to sign consent today. I recommend 1 unit of blood He will return in about 2 weeks for further follow-up  ESRD (end stage renal disease) on dialysis [facility]) He is doing well and he has signs of kidney function recovery He might discontinue hemodialysis soon Previously, I have discussed the case with his nephrologist who will be arranging for ESA and IV iron in the dialysis unit However, if the patient stopped receiving hemodialysis, I will arrange for ESA and IV iron here  H/O heart bypass surgery He had coronary artery disease and history of bypass surgery We will keep transfusion threshold above 8  Orders Placed This Encounter  Procedures   Informed Consent Details: Physician/Practitioner Attestation; Transcribe to consent form and obtain patient signature    Standing Status:   Future    Standing Expiration Date:   05/05/2024    Order Specific Question:   Physician/Practitioner attestation of informed consent for blood and or blood product transfusion    Answer:   I, the physician/practitioner, attest that I have discussed with the patient the benefits, risks, side effects, alternatives, likelihood of achieving goals and potential  problems during recovery for the procedure that I have provided informed consent.    Order Specific Question:   Product(s)    Answer:   All Product(s)   Care order/instruction    Transfuse Parameters    Standing Status:   Future    Standing Expiration Date:   05/05/2024   Type and screen         Standing Status:   Future    Number of Occurrences:   1    Standing Expiration Date:   05/05/2024    The total time spent in the appointment was 30 minutes encounter with patients including review of chart and various tests results, discussions about plan of care and coordination of care plan   All questions were answered. The patient knows to call the clinic with any problems, questions or concerns. No barriers to learning was detected.    Artis Delay, MD 8/30/20243:00 PM  INTERVAL HISTORY: [name] 62 y.o. male returns for further evaluation for anemia requiring transfusion support According to the patient, he is now only getting hemodialysis 1 day a week He is gaining kidney function He denies side effects from blood transfusion The patient denies any recent signs or symptoms of bleeding such as spontaneous epistaxis, hematuria or hematochezia.   SUMMARY OF HEMATOLOGIC HISTORY:  This is my initial dictation from Jan 29, 2023 for further details He was seen in the hospital due to constellation of symptoms worrisome for TTP The patient was intubated by the time I rounded on him this morning.  His son and significant other present in the waiting room.  I have reviewed the case with ICU team as  well as nephrologist this morning I have reviewed his chart extensively The patient have significant background history of coronary artery disease, stroke, diabetes, hypertension, hyperlipidemia, chronic kidney disease and others.  The patient has been on Plavix for a very long time, since 2019 after presentation with stroke. Approximately a month ago, he was noted to have acute on chronic renal  failure and was referred to cardiologist for follow-up.  He also have recent acute gout. Yesterday, he was brought into the emergency department.  His significant other noticed that the patient have acute confusion, shortness of breath, diaphoresis as well as near syncopal episode Soon after arrival to the ER, he has rapid deterioration with respiratory failure, profound abnormal labs, and altered mental status.  He was intubated. There were reported history of fever.  Sepsis was also was in the differential and significant cultures were drawn. His baseline CBC from August 12, 2022 showed hemoglobin of 10.2, platelet count of 252 and creatinine of 1.94 Late last night, his white count was 9.6, hemoglobin was 6.8, MCV was high at 115.7 and platelet count of 121.  Complete metabolic panel revealed creatinine of 4.24 with elevated serum bilirubin of 1.9.  His PT APTT and INR were also abnormal.  Fibrinogen level was adequate.  He is noted to have elevated D-dimer.  LDH was elevated at 597 Schistocytes were noted on peripheral smear.  Diagnosis of TTP was suspected.  The ICU team placed central line urgently. There were no reported signs of bleeding by the nursing staff The patient was subsequently transferred to [facility] when he was found to have bacteremia Starting in August, he has been receiving regular blood transfusion  I have reviewed the past medical history, past surgical history, social history and family history with the patient and they are unchanged from previous note.  ALLERGIES:  is allergic to other, plavix [clopidogrel bisulfate], fleet enema [enema], lovenox [enoxaparin], morphine, nsaids, and hydrocodone-acetaminophen.  MEDICATIONS:  Current Outpatient Medications  Medication Sig Dispense Refill   amLODipine (NORVASC) 10 MG tablet Take 1 tablet (10 mg total) by mouth daily. 90 tablet 0   atorvastatin (LIPITOR) 80 MG tablet TAKE 1 TABLET BY MOUTH ONCE DAILY AT 6 IN THE EVENING  (Patient taking differently: Take 80 mg by mouth at bedtime.) 90 tablet 3   carvedilol (COREG) 6.25 MG tablet Take 1 tablet (6.25 mg total) by mouth 2 (two) times daily. 180 tablet 2   dapagliflozin propanediol (FARXIGA) 10 MG TABS tablet Take 1 tablet (10 mg total) by mouth daily. 90 tablet 1   ezetimibe (ZETIA) 10 MG tablet Take 1 tablet (10 mg total) by mouth daily. 90 tablet 3   fluticasone (FLONASE) 50 MCG/ACT nasal spray Place 1 spray into both nostrils daily. 48 g 3   furosemide (LASIX) 40 MG tablet Per dialysis: 1 to 2 tabs daily depending on swelling(updated dosing) 180 tablet 1   lisinopril (ZESTRIL) 40 MG tablet Take 1 tablet (40 mg total) by mouth daily. 90 tablet 1   potassium chloride SA (KLOR-CON M) 20 MEQ tablet TAKE 1 TABLET BY MOUTH ON MONDAY, Wed, FRIDAY and Saturdays. 48 tablet 2   triamcinolone cream (KENALOG) 0.1 % Apply 1 Application topically daily.     No current facility-administered medications for this visit.     REVIEW OF SYSTEMS:   Constitutional: Denies fevers, chills or night sweats Eyes: Denies blurriness of vision Ears, nose, mouth, throat, and face: Denies mucositis or sore throat Respiratory: Denies cough, dyspnea or wheezes  Cardiovascular: Denies palpitation, chest discomfort or lower extremity swelling Gastrointestinal:  Denies nausea, heartburn or change in bowel habits Skin: Denies abnormal skin rashes Lymphatics: Denies new lymphadenopathy or easy bruising Neurological:Denies numbness, tingling or new weaknesses Behavioral/Psych: Mood is stable, no new changes  All other systems were reviewed with the patient and are negative.  PHYSICAL EXAMINATION: ECOG PERFORMANCE STATUS: 0 - Asymptomatic  Vitals:   05/06/23 0814  BP: 114/69  Pulse: 79  Resp: 18  Temp: (!) 97.4 F (36.3 C)  SpO2: 100%   Filed Weights   05/06/23 0814  Weight: 203 lb 12.8 oz (92.4 kg)    GENERAL:alert, no distress and comfortable NEURO: alert & oriented x 3 with  fluent speech, no focal motor/sensory deficits  LABORATORY DATA:  I have reviewed the data as listed     Component Value Date/Time   NA 135 04/06/2023 1024   NA 143 11/02/2022 1454   K 4.1 04/06/2023 1024   CL 98 04/06/2023 1024   CO2 26 04/06/2023 1024   GLUCOSE 125 (H) 04/06/2023 1024   BUN 16 04/06/2023 1024   BUN 31 (H) 11/02/2022 1454   CREATININE 1.98 (H) 04/06/2023 1024   CREATININE 1.05 02/24/2015 1601   CALCIUM 8.7 (L) 04/06/2023 1024   PROT 7.7 04/06/2023 1024   PROT 6.8 11/04/2021 1028   ALBUMIN 2.6 (L) 04/06/2023 1024   ALBUMIN 3.9 11/04/2021 1028   AST 13 (L) 04/06/2023 1024   ALT 8 04/06/2023 1024   ALKPHOS 75 04/06/2023 1024   BILITOT 0.7 04/06/2023 1024   BILITOT <0.2 11/04/2021 1028   GFRNONAA 37 (L) 04/06/2023 1024   GFRNONAA 80 02/24/2015 1601   GFRAA 62 10/09/2020 1520   GFRAA >89 02/24/2015 1601    No results found for: "SPEP", "UPEP"  Lab Results  Component Value Date   WBC 9.0 05/06/2023   NEUTROABS 3.2 05/06/2023   HGB 7.8 (L) 05/06/2023   HCT 24.3 (L) 05/06/2023   MCV 93.5 05/06/2023   PLT 115 (L) 05/06/2023      Chemistry      Component Value Date/Time   NA 135 04/06/2023 1024   NA 143 11/02/2022 1454   K 4.1 04/06/2023 1024   CL 98 04/06/2023 1024   CO2 26 04/06/2023 1024   BUN 16 04/06/2023 1024   BUN 31 (H) 11/02/2022 1454   CREATININE 1.98 (H) 04/06/2023 1024   CREATININE 1.05 02/24/2015 1601      Component Value Date/Time   CALCIUM 8.7 (L) 04/06/2023 1024   ALKPHOS 75 04/06/2023 1024   AST 13 (L) 04/06/2023 1024   ALT 8 04/06/2023 1024   BILITOT 0.7 04/06/2023 1024   BILITOT <0.2 11/04/2021 1028

## 2023-05-09 LAB — TYPE AND SCREEN
ABO/RH(D): B POS
Antibody Screen: NEGATIVE
Unit division: 0

## 2023-05-09 LAB — BPAM RBC
Blood Product Expiration Date: 202409102359
ISSUE DATE / TIME: 202408301004
Unit Type and Rh: 7300

## 2023-05-10 ENCOUNTER — Other Ambulatory Visit: Payer: Self-pay | Admitting: Hematology and Oncology

## 2023-05-10 ENCOUNTER — Telehealth: Payer: Self-pay

## 2023-05-10 DIAGNOSIS — Z992 Dependence on renal dialysis: Secondary | ICD-10-CM | POA: Diagnosis not present

## 2023-05-10 DIAGNOSIS — T8249XD Other complication of vascular dialysis catheter, subsequent encounter: Secondary | ICD-10-CM | POA: Diagnosis not present

## 2023-05-10 DIAGNOSIS — D539 Nutritional anemia, unspecified: Secondary | ICD-10-CM

## 2023-05-10 DIAGNOSIS — N170 Acute kidney failure with tubular necrosis: Secondary | ICD-10-CM | POA: Diagnosis not present

## 2023-05-10 NOTE — Telephone Encounter (Signed)
OK, I will add additional labs

## 2023-05-10 NOTE — Telephone Encounter (Signed)
Returned his call. Today is his last day of dialysis. He ask that Dr. Bertis Ruddy arrange those appts mentioned at last appt.

## 2023-05-16 ENCOUNTER — Other Ambulatory Visit: Payer: Self-pay | Admitting: Nephrology

## 2023-05-16 ENCOUNTER — Ambulatory Visit
Admission: RE | Admit: 2023-05-16 | Discharge: 2023-05-16 | Disposition: A | Discharge status: Home / Self Care | Payer: PPO | Source: Ambulatory Visit | Attending: Nephrology | Admitting: Nephrology

## 2023-05-16 DIAGNOSIS — N183 Chronic kidney disease, stage 3 unspecified: Secondary | ICD-10-CM | POA: Diagnosis not present

## 2023-05-16 DIAGNOSIS — N2581 Secondary hyperparathyroidism of renal origin: Secondary | ICD-10-CM | POA: Diagnosis not present

## 2023-05-16 DIAGNOSIS — R0602 Shortness of breath: Secondary | ICD-10-CM

## 2023-05-16 DIAGNOSIS — R809 Proteinuria, unspecified: Secondary | ICD-10-CM | POA: Diagnosis not present

## 2023-05-16 DIAGNOSIS — J90 Pleural effusion, not elsewhere classified: Secondary | ICD-10-CM | POA: Diagnosis not present

## 2023-05-16 DIAGNOSIS — J811 Chronic pulmonary edema: Secondary | ICD-10-CM | POA: Diagnosis not present

## 2023-05-16 DIAGNOSIS — I129 Hypertensive chronic kidney disease with stage 1 through stage 4 chronic kidney disease, or unspecified chronic kidney disease: Secondary | ICD-10-CM | POA: Diagnosis not present

## 2023-05-16 DIAGNOSIS — N189 Chronic kidney disease, unspecified: Secondary | ICD-10-CM | POA: Diagnosis not present

## 2023-05-16 DIAGNOSIS — D631 Anemia in chronic kidney disease: Secondary | ICD-10-CM | POA: Diagnosis not present

## 2023-05-17 ENCOUNTER — Other Ambulatory Visit: Payer: Self-pay

## 2023-05-17 ENCOUNTER — Telehealth: Payer: Self-pay

## 2023-05-17 ENCOUNTER — Inpatient Hospital Stay (HOSPITAL_BASED_OUTPATIENT_CLINIC_OR_DEPARTMENT_OTHER): Payer: PPO | Admitting: Physician Assistant

## 2023-05-17 ENCOUNTER — Inpatient Hospital Stay: Payer: PPO

## 2023-05-17 ENCOUNTER — Encounter (HOSPITAL_COMMUNITY): Payer: Self-pay

## 2023-05-17 ENCOUNTER — Emergency Department (HOSPITAL_COMMUNITY): Payer: PPO

## 2023-05-17 ENCOUNTER — Inpatient Hospital Stay (HOSPITAL_COMMUNITY)
Admission: EM | Admit: 2023-05-17 | Discharge: 2023-07-08 | DRG: 003 | Disposition: E | Payer: PPO | Attending: Surgery | Admitting: Surgery

## 2023-05-17 VITALS — BP 98/53 | HR 73 | Temp 97.4°F | Resp 22 | Ht 66.0 in | Wt 200.3 lb

## 2023-05-17 DIAGNOSIS — J9601 Acute respiratory failure with hypoxia: Secondary | ICD-10-CM

## 2023-05-17 DIAGNOSIS — I110 Hypertensive heart disease with heart failure: Secondary | ICD-10-CM | POA: Diagnosis not present

## 2023-05-17 DIAGNOSIS — I130 Hypertensive heart and chronic kidney disease with heart failure and stage 1 through stage 4 chronic kidney disease, or unspecified chronic kidney disease: Secondary | ICD-10-CM | POA: Diagnosis not present

## 2023-05-17 DIAGNOSIS — R609 Edema, unspecified: Secondary | ICD-10-CM | POA: Insufficient documentation

## 2023-05-17 DIAGNOSIS — E873 Alkalosis: Secondary | ICD-10-CM | POA: Diagnosis not present

## 2023-05-17 DIAGNOSIS — D539 Nutritional anemia, unspecified: Secondary | ICD-10-CM

## 2023-05-17 DIAGNOSIS — Z6832 Body mass index (BMI) 32.0-32.9, adult: Secondary | ICD-10-CM

## 2023-05-17 DIAGNOSIS — I441 Atrioventricular block, second degree: Secondary | ICD-10-CM | POA: Diagnosis present

## 2023-05-17 DIAGNOSIS — I071 Rheumatic tricuspid insufficiency: Secondary | ICD-10-CM | POA: Diagnosis present

## 2023-05-17 DIAGNOSIS — Y831 Surgical operation with implant of artificial internal device as the cause of abnormal reaction of the patient, or of later complication, without mention of misadventure at the time of the procedure: Secondary | ICD-10-CM | POA: Diagnosis present

## 2023-05-17 DIAGNOSIS — J80 Acute respiratory distress syndrome: Secondary | ICD-10-CM | POA: Diagnosis present

## 2023-05-17 DIAGNOSIS — G928 Other toxic encephalopathy: Secondary | ICD-10-CM | POA: Diagnosis not present

## 2023-05-17 DIAGNOSIS — Z8673 Personal history of transient ischemic attack (TIA), and cerebral infarction without residual deficits: Secondary | ICD-10-CM

## 2023-05-17 DIAGNOSIS — E7220 Disorder of urea cycle metabolism, unspecified: Secondary | ICD-10-CM | POA: Diagnosis not present

## 2023-05-17 DIAGNOSIS — E8779 Other fluid overload: Secondary | ICD-10-CM | POA: Diagnosis not present

## 2023-05-17 DIAGNOSIS — T8111XA Postprocedural  cardiogenic shock, initial encounter: Secondary | ICD-10-CM

## 2023-05-17 DIAGNOSIS — R0989 Other specified symptoms and signs involving the circulatory and respiratory systems: Secondary | ICD-10-CM | POA: Diagnosis not present

## 2023-05-17 DIAGNOSIS — D6489 Other specified anemias: Secondary | ICD-10-CM | POA: Diagnosis not present

## 2023-05-17 DIAGNOSIS — I493 Ventricular premature depolarization: Secondary | ICD-10-CM | POA: Diagnosis present

## 2023-05-17 DIAGNOSIS — D649 Anemia, unspecified: Secondary | ICD-10-CM | POA: Diagnosis not present

## 2023-05-17 DIAGNOSIS — Z79899 Other long term (current) drug therapy: Secondary | ICD-10-CM

## 2023-05-17 DIAGNOSIS — Z833 Family history of diabetes mellitus: Secondary | ICD-10-CM

## 2023-05-17 DIAGNOSIS — R0602 Shortness of breath: Secondary | ICD-10-CM | POA: Insufficient documentation

## 2023-05-17 DIAGNOSIS — D631 Anemia in chronic kidney disease: Secondary | ICD-10-CM | POA: Diagnosis present

## 2023-05-17 DIAGNOSIS — D62 Acute posthemorrhagic anemia: Secondary | ICD-10-CM | POA: Diagnosis not present

## 2023-05-17 DIAGNOSIS — E669 Obesity, unspecified: Secondary | ICD-10-CM | POA: Diagnosis present

## 2023-05-17 DIAGNOSIS — R570 Cardiogenic shock: Secondary | ICD-10-CM | POA: Diagnosis not present

## 2023-05-17 DIAGNOSIS — E875 Hyperkalemia: Secondary | ICD-10-CM | POA: Diagnosis present

## 2023-05-17 DIAGNOSIS — K567 Ileus, unspecified: Secondary | ICD-10-CM | POA: Diagnosis not present

## 2023-05-17 DIAGNOSIS — Z888 Allergy status to other drugs, medicaments and biological substances status: Secondary | ICD-10-CM

## 2023-05-17 DIAGNOSIS — R042 Hemoptysis: Secondary | ICD-10-CM | POA: Diagnosis not present

## 2023-05-17 DIAGNOSIS — E1152 Type 2 diabetes mellitus with diabetic peripheral angiopathy with gangrene: Secondary | ICD-10-CM | POA: Diagnosis not present

## 2023-05-17 DIAGNOSIS — E8771 Transfusion associated circulatory overload: Secondary | ICD-10-CM | POA: Diagnosis not present

## 2023-05-17 DIAGNOSIS — Z515 Encounter for palliative care: Secondary | ICD-10-CM

## 2023-05-17 DIAGNOSIS — I509 Heart failure, unspecified: Secondary | ICD-10-CM

## 2023-05-17 DIAGNOSIS — Z95828 Presence of other vascular implants and grafts: Secondary | ICD-10-CM

## 2023-05-17 DIAGNOSIS — N184 Chronic kidney disease, stage 4 (severe): Secondary | ICD-10-CM | POA: Diagnosis present

## 2023-05-17 DIAGNOSIS — T8201XA Breakdown (mechanical) of heart valve prosthesis, initial encounter: Secondary | ICD-10-CM

## 2023-05-17 DIAGNOSIS — Z951 Presence of aortocoronary bypass graft: Secondary | ICD-10-CM

## 2023-05-17 DIAGNOSIS — D689 Coagulation defect, unspecified: Secondary | ICD-10-CM | POA: Diagnosis present

## 2023-05-17 DIAGNOSIS — Z8249 Family history of ischemic heart disease and other diseases of the circulatory system: Secondary | ICD-10-CM

## 2023-05-17 DIAGNOSIS — R0489 Hemorrhage from other sites in respiratory passages: Secondary | ICD-10-CM | POA: Diagnosis not present

## 2023-05-17 DIAGNOSIS — E1122 Type 2 diabetes mellitus with diabetic chronic kidney disease: Secondary | ICD-10-CM | POA: Diagnosis present

## 2023-05-17 DIAGNOSIS — R079 Chest pain, unspecified: Secondary | ICD-10-CM | POA: Insufficient documentation

## 2023-05-17 DIAGNOSIS — Q2543 Congenital aneurysm of aorta: Secondary | ICD-10-CM

## 2023-05-17 DIAGNOSIS — N189 Chronic kidney disease, unspecified: Secondary | ICD-10-CM | POA: Insufficient documentation

## 2023-05-17 DIAGNOSIS — T17998A Other foreign object in respiratory tract, part unspecified causing other injury, initial encounter: Secondary | ICD-10-CM | POA: Diagnosis not present

## 2023-05-17 DIAGNOSIS — I10 Essential (primary) hypertension: Secondary | ICD-10-CM | POA: Diagnosis present

## 2023-05-17 DIAGNOSIS — Z86718 Personal history of other venous thrombosis and embolism: Secondary | ICD-10-CM

## 2023-05-17 DIAGNOSIS — R0789 Other chest pain: Secondary | ICD-10-CM

## 2023-05-17 DIAGNOSIS — I4891 Unspecified atrial fibrillation: Secondary | ICD-10-CM | POA: Diagnosis not present

## 2023-05-17 DIAGNOSIS — I5023 Acute on chronic systolic (congestive) heart failure: Secondary | ICD-10-CM | POA: Diagnosis not present

## 2023-05-17 DIAGNOSIS — R34 Anuria and oliguria: Secondary | ICD-10-CM | POA: Diagnosis present

## 2023-05-17 DIAGNOSIS — Z91018 Allergy to other foods: Secondary | ICD-10-CM

## 2023-05-17 DIAGNOSIS — D6959 Other secondary thrombocytopenia: Secondary | ICD-10-CM | POA: Diagnosis not present

## 2023-05-17 DIAGNOSIS — E119 Type 2 diabetes mellitus without complications: Secondary | ICD-10-CM

## 2023-05-17 DIAGNOSIS — K56609 Unspecified intestinal obstruction, unspecified as to partial versus complete obstruction: Secondary | ICD-10-CM | POA: Diagnosis not present

## 2023-05-17 DIAGNOSIS — I252 Old myocardial infarction: Secondary | ICD-10-CM

## 2023-05-17 DIAGNOSIS — Z8619 Personal history of other infectious and parasitic diseases: Secondary | ICD-10-CM

## 2023-05-17 DIAGNOSIS — I452 Bifascicular block: Secondary | ICD-10-CM | POA: Diagnosis not present

## 2023-05-17 DIAGNOSIS — Z885 Allergy status to narcotic agent status: Secondary | ICD-10-CM

## 2023-05-17 DIAGNOSIS — G4733 Obstructive sleep apnea (adult) (pediatric): Secondary | ICD-10-CM | POA: Diagnosis present

## 2023-05-17 DIAGNOSIS — E43 Unspecified severe protein-calorie malnutrition: Secondary | ICD-10-CM | POA: Diagnosis present

## 2023-05-17 DIAGNOSIS — B371 Pulmonary candidiasis: Secondary | ICD-10-CM | POA: Diagnosis not present

## 2023-05-17 DIAGNOSIS — Z4682 Encounter for fitting and adjustment of non-vascular catheter: Secondary | ICD-10-CM | POA: Diagnosis not present

## 2023-05-17 DIAGNOSIS — I77810 Thoracic aortic ectasia: Secondary | ICD-10-CM | POA: Diagnosis present

## 2023-05-17 DIAGNOSIS — I5043 Acute on chronic combined systolic (congestive) and diastolic (congestive) heart failure: Secondary | ICD-10-CM | POA: Diagnosis present

## 2023-05-17 DIAGNOSIS — Z8674 Personal history of sudden cardiac arrest: Secondary | ICD-10-CM

## 2023-05-17 DIAGNOSIS — Y95 Nosocomial condition: Secondary | ICD-10-CM | POA: Diagnosis not present

## 2023-05-17 DIAGNOSIS — E8729 Other acidosis: Secondary | ICD-10-CM | POA: Diagnosis not present

## 2023-05-17 DIAGNOSIS — Z89021 Acquired absence of right finger(s): Secondary | ICD-10-CM

## 2023-05-17 DIAGNOSIS — B449 Aspergillosis, unspecified: Secondary | ICD-10-CM | POA: Diagnosis not present

## 2023-05-17 DIAGNOSIS — E8770 Fluid overload, unspecified: Secondary | ICD-10-CM | POA: Diagnosis not present

## 2023-05-17 DIAGNOSIS — W44F9XA Other object of natural or organic material, entering into or through a natural orifice, initial encounter: Secondary | ICD-10-CM | POA: Diagnosis not present

## 2023-05-17 DIAGNOSIS — K219 Gastro-esophageal reflux disease without esophagitis: Secondary | ICD-10-CM | POA: Diagnosis present

## 2023-05-17 DIAGNOSIS — Z1152 Encounter for screening for COVID-19: Secondary | ICD-10-CM

## 2023-05-17 DIAGNOSIS — I2510 Atherosclerotic heart disease of native coronary artery without angina pectoris: Secondary | ICD-10-CM | POA: Diagnosis present

## 2023-05-17 DIAGNOSIS — E7800 Pure hypercholesterolemia, unspecified: Secondary | ICD-10-CM | POA: Diagnosis present

## 2023-05-17 DIAGNOSIS — I9789 Other postprocedural complications and disorders of the circulatory system, not elsewhere classified: Secondary | ICD-10-CM | POA: Diagnosis not present

## 2023-05-17 DIAGNOSIS — F439 Reaction to severe stress, unspecified: Secondary | ICD-10-CM | POA: Diagnosis not present

## 2023-05-17 DIAGNOSIS — A419 Sepsis, unspecified organism: Secondary | ICD-10-CM | POA: Diagnosis not present

## 2023-05-17 DIAGNOSIS — N179 Acute kidney failure, unspecified: Secondary | ICD-10-CM | POA: Diagnosis present

## 2023-05-17 DIAGNOSIS — R6521 Severe sepsis with septic shock: Secondary | ICD-10-CM | POA: Diagnosis not present

## 2023-05-17 DIAGNOSIS — K7290 Hepatic failure, unspecified without coma: Secondary | ICD-10-CM | POA: Diagnosis not present

## 2023-05-17 LAB — FERRITIN: Ferritin: 2200 ng/mL — ABNORMAL HIGH (ref 24–336)

## 2023-05-17 LAB — CBC WITH DIFFERENTIAL (CANCER CENTER ONLY)
Abs Immature Granulocytes: 0.31 K/uL — ABNORMAL HIGH (ref 0.00–0.07)
Basophils Absolute: 0 K/uL (ref 0.0–0.1)
Basophils Relative: 0 %
Eosinophils Absolute: 0 K/uL (ref 0.0–0.5)
Eosinophils Relative: 0 %
HCT: 20 % — ABNORMAL LOW (ref 39.0–52.0)
Hemoglobin: 6.4 g/dL — CL (ref 13.0–17.0)
Immature Granulocytes: 3 %
Lymphocytes Relative: 22 %
Lymphs Abs: 2.5 K/uL (ref 0.7–4.0)
MCH: 30 pg (ref 26.0–34.0)
MCHC: 32 g/dL (ref 30.0–36.0)
MCV: 93.9 fL (ref 80.0–100.0)
Monocytes Absolute: 4.3 K/uL — ABNORMAL HIGH (ref 0.1–1.0)
Monocytes Relative: 37 %
Neutro Abs: 4.4 K/uL (ref 1.7–7.7)
Neutrophils Relative %: 38 %
Platelet Count: 109 K/uL — ABNORMAL LOW (ref 150–400)
RBC: 2.13 MIL/uL — ABNORMAL LOW (ref 4.22–5.81)
RDW: 22.1 % — ABNORMAL HIGH (ref 11.5–15.5)
WBC Count: 11.6 K/uL — ABNORMAL HIGH (ref 4.0–10.5)
nRBC: 0 % (ref 0.0–0.2)

## 2023-05-17 LAB — CMP (CANCER CENTER ONLY)
ALT: <5 U/L (ref 0–44)
AST: 14 U/L — ABNORMAL LOW (ref 15–41)
Albumin: 3.5 g/dL (ref 3.5–5.0)
Alkaline Phosphatase: 98 U/L (ref 38–126)
Anion gap: 8 (ref 5–15)
BUN: 53 mg/dL — ABNORMAL HIGH (ref 8–23)
CO2: 21 mmol/L — ABNORMAL LOW (ref 22–32)
Calcium: 9 mg/dL (ref 8.9–10.3)
Chloride: 108 mmol/L (ref 98–111)
Creatinine: 2.71 mg/dL — ABNORMAL HIGH (ref 0.61–1.24)
GFR, Estimated: 26 mL/min — ABNORMAL LOW (ref >60)
Glucose, Bld: 110 mg/dL — ABNORMAL HIGH (ref 70–99)
Potassium: 5.8 mmol/L — ABNORMAL HIGH (ref 3.5–5.1)
Sodium: 137 mmol/L (ref 135–145)
Total Bilirubin: 1.5 mg/dL — ABNORMAL HIGH (ref 0.3–1.2)
Total Protein: 8.2 g/dL — ABNORMAL HIGH (ref 6.5–8.1)

## 2023-05-17 LAB — BASIC METABOLIC PANEL
Anion gap: 11 (ref 5–15)
BUN: 56 mg/dL — ABNORMAL HIGH (ref 8–23)
CO2: 19 mmol/L — ABNORMAL LOW (ref 22–32)
Calcium: 8.8 mg/dL — ABNORMAL LOW (ref 8.9–10.3)
Chloride: 107 mmol/L (ref 98–111)
Creatinine, Ser: 2.7 mg/dL — ABNORMAL HIGH (ref 0.61–1.24)
GFR, Estimated: 26 mL/min — ABNORMAL LOW (ref >60)
Glucose, Bld: 115 mg/dL — ABNORMAL HIGH (ref 70–99)
Potassium: 5.1 mmol/L (ref 3.5–5.1)
Sodium: 137 mmol/L (ref 135–145)

## 2023-05-17 LAB — SAMPLE TO BLOOD BANK

## 2023-05-17 LAB — GLUCOSE, CAPILLARY: Glucose-Capillary: 122 mg/dL — ABNORMAL HIGH (ref 70–99)

## 2023-05-17 LAB — PREPARE RBC (CROSSMATCH)

## 2023-05-17 LAB — IRON AND TIBC
Iron: 140 ug/dL (ref 45–182)
Saturation Ratios: 69 % — ABNORMAL HIGH (ref 17.9–39.5)
TIBC: 204 ug/dL — ABNORMAL LOW (ref 250–450)
UIBC: 64 ug/dL

## 2023-05-17 LAB — HEMOGLOBIN AND HEMATOCRIT, BLOOD
HCT: 24.2 % — ABNORMAL LOW (ref 39.0–52.0)
Hemoglobin: 7.6 g/dL — ABNORMAL LOW (ref 13.0–17.0)

## 2023-05-17 LAB — CBC
HCT: 19.7 % — ABNORMAL LOW (ref 39.0–52.0)
Hemoglobin: 6 g/dL — CL (ref 13.0–17.0)
MCH: 29.4 pg (ref 26.0–34.0)
MCHC: 30.5 g/dL (ref 30.0–36.0)
MCV: 96.6 fL (ref 80.0–100.0)
Platelets: 128 10*3/uL — ABNORMAL LOW (ref 150–400)
RBC: 2.04 MIL/uL — ABNORMAL LOW (ref 4.22–5.81)
RDW: 22.2 % — ABNORMAL HIGH (ref 11.5–15.5)
WBC: 12 10*3/uL — ABNORMAL HIGH (ref 4.0–10.5)
nRBC: 0 % (ref 0.0–0.2)

## 2023-05-17 LAB — TROPONIN I (HIGH SENSITIVITY)
Troponin I (High Sensitivity): 26 ng/L — ABNORMAL HIGH (ref <18)
Troponin I (High Sensitivity): 23 ng/L — ABNORMAL HIGH (ref <18)

## 2023-05-17 LAB — BRAIN NATRIURETIC PEPTIDE: B Natriuretic Peptide: 2097 pg/mL — ABNORMAL HIGH (ref 0.0–100.0)

## 2023-05-17 MED ORDER — ATORVASTATIN CALCIUM 80 MG PO TABS
80.0000 mg | ORAL_TABLET | Freq: Every day | ORAL | Status: DC
  Administered 2023-05-19: 80 mg via ORAL
  Filled 2023-05-17: qty 1

## 2023-05-17 MED ORDER — ONDANSETRON HCL 4 MG/2ML IJ SOLN: 4.0000 mg | Freq: Four times a day (QID) | INTRAMUSCULAR | Status: DC | PRN

## 2023-05-17 MED ORDER — POTASSIUM CHLORIDE CRYS ER 20 MEQ PO TBCR: 20.0000 meq | EXTENDED_RELEASE_TABLET | ORAL | Status: DC

## 2023-05-17 MED ORDER — SODIUM CHLORIDE 0.9% IV SOLUTION: Freq: Once | INTRAVENOUS | Status: AC

## 2023-05-17 MED ORDER — CARVEDILOL 6.25 MG PO TABS
6.2500 mg | ORAL_TABLET | Freq: Two times a day (BID) | ORAL | Status: DC
  Administered 2023-05-17 – 2023-05-18 (×2): 6.25 mg via ORAL
  Filled 2023-05-17 (×2): qty 1

## 2023-05-17 MED ORDER — FENTANYL CITRATE PF 50 MCG/ML IJ SOSY
50.0000 ug | PREFILLED_SYRINGE | Freq: Once | INTRAMUSCULAR | Status: AC
  Administered 2023-05-17: 50 ug via INTRAVENOUS
  Filled 2023-05-17: qty 1

## 2023-05-17 MED ORDER — EZETIMIBE 10 MG PO TABS
10.0000 mg | ORAL_TABLET | Freq: Every day | ORAL | Status: DC
  Administered 2023-05-18 – 2023-05-21 (×3): 10 mg via ORAL
  Filled 2023-05-17 (×3): qty 1

## 2023-05-17 MED ORDER — ACETAMINOPHEN 325 MG PO TABS
650.0000 mg | ORAL_TABLET | Freq: Four times a day (QID) | ORAL | Status: DC | PRN
  Administered 2023-05-19 – 2023-05-20 (×2): 650 mg via ORAL
  Filled 2023-05-17 (×3): qty 2

## 2023-05-17 MED ORDER — ACETAMINOPHEN 650 MG RE SUPP: 650.0000 mg | Freq: Four times a day (QID) | RECTAL | Status: DC | PRN

## 2023-05-17 MED ORDER — ACETAMINOPHEN 10 MG/ML IV SOLN
1000.0000 mg | Freq: Four times a day (QID) | INTRAVENOUS | Status: DC
  Administered 2023-05-17 (×2): 1000 mg via INTRAVENOUS
  Filled 2023-05-17 (×5): qty 100

## 2023-05-17 MED ORDER — ONDANSETRON HCL 4 MG PO TABS: 4.0000 mg | ORAL_TABLET | Freq: Four times a day (QID) | ORAL | Status: DC | PRN

## 2023-05-17 MED ORDER — INSULIN ASPART 100 UNIT/ML IJ SOLN
0.0000 [IU] | Freq: Three times a day (TID) | INTRAMUSCULAR | Status: DC
  Filled 2023-05-17: qty 0.09

## 2023-05-17 MED ORDER — FUROSEMIDE 10 MG/ML IJ SOLN
120.0000 mg | Freq: Four times a day (QID) | INTRAVENOUS | Status: DC
  Administered 2023-05-17 – 2023-05-18 (×4): 120 mg via INTRAVENOUS
  Filled 2023-05-17 (×2): qty 10
  Filled 2023-05-17 (×2): qty 12
  Filled 2023-05-17 (×2): qty 10

## 2023-05-17 MED ORDER — CHLORHEXIDINE GLUCONATE CLOTH 2 % EX PADS
6.0000 | MEDICATED_PAD | Freq: Every day | CUTANEOUS | Status: DC
  Administered 2023-05-18 – 2023-05-21 (×3): 6 via TOPICAL

## 2023-05-17 MED ORDER — AMLODIPINE BESYLATE 10 MG PO TABS
10.0000 mg | ORAL_TABLET | Freq: Every day | ORAL | Status: DC
  Administered 2023-05-18: 10 mg via ORAL
  Filled 2023-05-17: qty 1

## 2023-05-17 NOTE — Telephone Encounter (Signed)
He showed up in the lobby requesting appt. He is crying and upset. He went to dialysis center yesterday and they told him that he needed a blood transfusion. Appts scheduled at [facility] on 9/12. He feels that he needs blood transfusion and feel short of breath. Added lab appt for today and [facility] will see him to evaluate.

## 2023-05-17 NOTE — ED Notes (Signed)
ED TO INPATIENT HANDOFF REPORT  ED Nurse Name and Phone #: Raynelle Fanning, RN  S Name/Age/Gender [name] 62 y.o. male Room/Bed: WA02/WA02  Code Status   Code Status: Full Code  Home/SNF/Other Home Patient oriented to: self, place, time, and situation Is this baseline? Yes   Triage Complete: Triage complete  Chief Complaint Volume overload [E87.70]  Triage Note BIB cancer Center, seen for anemia. Did not infuse today d/t Chest pain for one week, BLE swelling and SOB. Taken off dialysis one week ago for increased kidney function.    Allergies Allergies  Allergen Reactions   Other Anaphylaxis    Mushrooms  Not listed on MAR    Plavix [Clopidogrel Bisulfate] Other (See Comments)    TTP Not listed on the [facility]   Fleet Enema [Enema] Other (See Comments)    Unknown reaction   Lovenox [Enoxaparin] Other (See Comments)    Unknown reaction   Morphine Other (See Comments)    Unknown reaction   Nsaids Other (See Comments)    Unknown reaction   Hydrocodone-Acetaminophen Itching    Level of Care/Admitting Diagnosis ED Disposition     ED Disposition  Admit   Condition  --   Comment  Hospital Area: [facility] [[id number]]  Level of Care: Progressive [102]  Admit to Progressive based on following criteria: NEPHROLOGY stable condition requiring close monitoring for AKI, requiring Hemodialysis or Peritoneal Dialysis either from expected electrolyte imbalance, acidosis, or fluid overload that can be managed by NIPPV or high flow oxygen.  Admit to Progressive based on following criteria: CARDIOVASCULAR & THORACIC of moderate stability with acute coronary syndrome symptoms/low risk myocardial infarction/hypertensive urgency/arrhythmias/heart failure potentially compromising stability and stable post cardiovascular intervention patients.  May place patient in observation at [facility] or [name] if equivalent level of care is available:: No  Covid Evaluation:  Asymptomatic - no recent exposure (last 10 days) testing not required  Diagnosis: Volume overload [960454]  Admitting Physician: Bobette Mo [[id number]]  Attending Physician: Bobette Mo [[id number]]          B Medical/Surgery History Past Medical History:  Diagnosis Date   Allergy    Anemia    Anxiety    Baker's cyst of knee    Blood transfusion without reported diagnosis    as baby    Coronary artery disease    quadruple bypass - March 2016   GERD (gastroesophageal reflux disease)    Gouty arthritis    "real bad" (01/17/2013)   Heart murmur    Hypercholesteremia    Hypertension    MSSA bacteremia 01/29/2023   Myocardial infarction (HCC) 2017   PEA (Pulseless electrical activity) (HCC) 01/29/2023   Stroke (HCC)    Type II diabetes mellitus (HCC)    Past Surgical History:  Procedure Laterality Date   AORTIC VALVE REPLACEMENT N/A 11/25/2015   Procedure: AORTIC VALVE REPLACEMENT (AVR) USING A MAGNA EASE ;  Surgeon: Alleen Borne, MD;  Location: [facility] OR;  Service: Open Heart Surgery;  Laterality: N/A;   CARDIAC CATHETERIZATION N/A 11/19/2015   Procedure: Right/Left Heart Cath and Coronary Angiography;  Surgeon: Lyn Records, MD;  Location: [facility] INVASIVE CV LAB;  Service: Cardiovascular;  Laterality: N/A;   COLONOSCOPY  2004   CORONARY ARTERY BYPASS GRAFT N/A 11/25/2015   Procedure: CORONARY ARTERY BYPASS GRAFTING (CABG)x4 LIMA-LAD; SVG-OM; SVG-RCA; SVG-DIAG;  Surgeon: Alleen Borne, MD;  Location: [facility] OR;  Service: Open Heart Surgery;  Laterality: N/A;   CYST  REMOVAL TRUNK N/A 08/24/2016   Procedure: EXCISION OF BACK ABSCESS;  Surgeon: Jimmye Norman, MD;  Location: [facility];  Service: General;  Laterality: N/A;   FINGER AMPUTATION Right 1980's   3rd & 4th digits "got them mashed" (01/17/2013)   KNEE ARTHROSCOPY Right 1980's   MULTIPLE EXTRACTIONS WITH ALVEOLOPLASTY Bilateral 11/21/2015   Procedure: Extraction of tooth #'s 2,12 with alveoloplasty  and gross debridement of remaining dentition;  Surgeon: Charlynne Pander, DDS;  Location: [facility] OR;  Service: Oral Surgery;  Laterality: Bilateral;   TEE WITHOUT CARDIOVERSION N/A 11/20/2015   Procedure: TRANSESOPHAGEAL ECHOCARDIOGRAM (TEE);  Surgeon: Laurey Morale, MD;  Location: [facility] ENDOSCOPY;  Service: Cardiovascular;  Laterality: N/A;   TEE WITHOUT CARDIOVERSION N/A 11/25/2015   Procedure: TRANSESOPHAGEAL ECHOCARDIOGRAM (TEE);  Surgeon: Alleen Borne, MD;  Location: [facility] OR;  Service: Open Heart Surgery;  Laterality: N/A;     A IV Location/Drains/Wounds Patient Lines/Drains/Airways Status     Active Line/Drains/Airways     Name Placement date Placement time Site Days   Peripheral IV 05/17/23 20 G Right Antecubital 05/17/23  1102  Antecubital  less than 1   Hemodialysis Catheter Left Internal jugular 01/29/23  0550  Internal jugular  108            Intake/Output Last 24 hours  Intake/Output Summary (Last 24 hours) at 05/17/2023 1936 Last data filed at 05/17/2023 1845 Gross per 24 hour  Intake 340 ml  Output 300 ml  Net 40 ml    Labs/Imaging Results for orders placed or performed during the hospital encounter of 05/17/23 (from the past 48 hour(s))  Basic metabolic panel     Status: Abnormal   Collection Time: 05/17/23 10:21 AM  Result Value Ref Range   Sodium 137 135 - 145 mmol/L   Potassium 5.1 3.5 - 5.1 mmol/L   Chloride 107 98 - 111 mmol/L   CO2 19 (L) 22 - 32 mmol/L   Glucose, Bld 115 (H) 70 - 99 mg/dL    Comment: Glucose reference range applies only to samples taken after fasting for at least 8 hours.   BUN 56 (H) 8 - 23 mg/dL   Creatinine, Ser 1.61 (H) 0.61 - 1.24 mg/dL   Calcium 8.8 (L) 8.9 - 10.3 mg/dL   GFR, Estimated 26 (L) >60 mL/min    Comment: (NOTE) Calculated using the CKD-EPI Creatinine Equation (2021)    Anion gap 11 5 - 15    Comment: Performed at [facility], [address], [place], [place] [address]  CBC     Status: Abnormal    Collection Time: 05/17/23 10:21 AM  Result Value Ref Range   WBC 12.0 (H) 4.0 - 10.5 K/uL   RBC 2.04 (L) 4.22 - 5.81 MIL/uL   Hemoglobin 6.0 (LL) 13.0 - 17.0 g/dL    Comment: REPEATED TO VERIFY THIS CRITICAL RESULT HAS VERIFIED AND BEEN CALLED TO CRAWFORD, D. RN BY ALIEFENDIC,FATIMA ON 09 10 2024 AT 1126, AND HAS BEEN READ BACK.     HCT 19.7 (L) 39.0 - 52.0 %   MCV 96.6 80.0 - 100.0 fL   MCH 29.4 26.0 - 34.0 pg   MCHC 30.5 30.0 - 36.0 g/dL   RDW 54.0 (H) 98.1 - 19.1 %   Platelets 128 (L) 150 - 400 K/uL   nRBC 0.0 0.0 - 0.2 %    Comment: Performed at [facility], [address], [place], [place] [address]  Troponin I (High Sensitivity)  Status: Abnormal   Collection Time: 05/17/23 10:21 AM  Result Value Ref Range   Troponin I (High Sensitivity) 26 (H) <18 ng/L    Comment: (NOTE) Elevated high sensitivity troponin I (hsTnI) values and significant  changes across serial measurements may suggest ACS but many other  chronic and acute conditions are known to elevate hsTnI results.  Refer to the "Links" section for chest pain algorithms and additional  guidance. Performed at [facility], [address], [place], [place] [address]   Type and screen [facility]     Status: None (Preliminary result)   Collection Time: 05/17/23 10:31 AM  Result Value Ref Range   ABO/RH(D) B POS    Antibody Screen NEG    Sample Expiration 05/20/2023,2359    Unit Number [id number]    Blood Component Type RED CELLS,LR    Unit division 00    Status of Unit ISSUED    Transfusion Status OK TO TRANSFUSE    Crossmatch Result      Compatible Performed at [facility], [address] [place], [place] [address]   Brain natriuretic peptide     Status: Abnormal   Collection Time: 05/17/23 11:00 AM  Result Value Ref Range   B Natriuretic Peptide 2,097.0 (H) 0.0 - 100.0 pg/mL    Comment: Performed at [facility], [address], [place], [place] [address]  Prepare RBC (crossmatch)     Status: None   Collection Time: 05/17/23 12:44 PM  Result Value Ref Range   Order Confirmation      ORDER PROCESSED BY BLOOD BANK Performed at [facility], [address] [place], [place] [address]   Troponin I (High Sensitivity)     Status: Abnormal   Collection Time: 05/17/23 12:50 PM  Result Value Ref Range   Troponin I (High Sensitivity) 23 (H) <18 ng/L    Comment: (NOTE) Elevated high sensitivity troponin I (hsTnI) values and significant  changes across serial measurements may suggest ACS but many other  chronic and acute conditions are known to elevate hsTnI results.  Refer to the "Links" section for chest pain algorithms and additional  guidance. Performed at [facility], [address], [place], [place] [address]    DG Chest Port 1 View  Result Date: 05/17/2023 CLINICAL DATA:  SHORTNESS OF BREATH. CHEST PAIN. LOWER EXTREMITY SWELLING. EXAM: PORTABLE CHEST 1 VIEW COMPARISON:  05/16/2023 FINDINGS: There is a right chest wall dialysis catheter with tips in the distal SVC. Postoperative changes from prior median sternotomy. Heart size appears stable. There is moderate diffuse pulmonary vascular congestion. No frank edema. No pleural fluid or consolidative change. IMPRESSION: Moderate pulmonary vascular congestion. Electronically Signed   By: Signa Kell M.D.   On: 05/17/2023 13:15    Pending Labs Unresulted Labs (From admission, onward)     Start     Ordered   05/18/23 0500  CBC  Tomorrow morning,   R        05/17/23 1448   05/18/23 0500  Comprehensive metabolic panel  Tomorrow morning,   R        05/17/23 1448   05/18/23 0500  Phosphorus  Tomorrow morning,   R        05/17/23 1448   05/17/23 1800  Iron and TIBC  Once,   AD        05/17/23 1800   05/17/23 1800  Ferritin  Once,   AD  05/17/23 1800            Vitals/Pain Today's Vitals    05/17/23 1645 05/17/23 1700 05/17/23 1845 05/17/23 1915  BP: 102/65 106/62 108/72 114/65  Pulse:   77   Resp: (!) 31 (!) 35 (!) 34   Temp:   98.2 F (36.8 C)   TempSrc:   Oral   SpO2:  95% 95%   PainSc:        Isolation Precautions No active isolations  Medications Medications  0.9 %  sodium chloride infusion (Manually program via Guardrails IV Fluids) (has no administration in time range)  acetaminophen (OFIRMEV) IV 1,000 mg (1,000 mg Intravenous New Bag/Given 05/17/23 1406)  furosemide (LASIX) 120 mg in dextrose 5 % 50 mL IVPB (120 mg Intravenous New Bag/Given 05/17/23 1501)  acetaminophen (TYLENOL) tablet 650 mg (has no administration in time range)    Or  acetaminophen (TYLENOL) suppository 650 mg (has no administration in time range)  ondansetron (ZOFRAN) tablet 4 mg (has no administration in time range)    Or  ondansetron (ZOFRAN) injection 4 mg (has no administration in time range)  fentaNYL (SUBLIMAZE) injection 50 mcg (50 mcg Intravenous Given 05/17/23 1106)    Mobility walks     Focused Assessments Cardiac Assessment Handoff:    Lab Results  Component Value Date   TROPONINI 0.06 (H) 12/11/2015   Lab Results  Component Value Date   DDIMER >20.00 (H) 01/29/2023   Does the Patient currently have chest pain? No    R Recommendations: See Admitting Provider Note  Report given to:   Additional Notes: Pt is A&O x4. Being admitted for Volume Overload

## 2023-05-17 NOTE — ED Provider Notes (Signed)
[place] EMERGENCY DEPARTMENT AT [facility] Provider Note   CSN: [id number] Arrival date & time: 05/17/23  1004     History  Chief Complaint  Patient presents with   Chest Pain    Micai Anuszewski is a 62 y.o. male.  62 year old male with past medical history of hypertension, hyperlipidemia, and chronic kidney disease presenting to the emergency department today with lower extremity swelling, shortness of breath, orthopnea, and chest pressure.  The patient states has been going now for the past few weeks.  He states that he was on dialysis following what sounds like cardiac arrest back in [place] Day weekend.  The patient was on dialysis but his kidney function was improving so he stopped dialysis last week.  He states that the symptoms have started since then.  He denies any pleuritic pain or sharp chest pain.  He was evaluated by his nephrologist with some labs and was told he was anemic.  He went today for possible transfusion and was complaining of his chest pressure and was sent to the emergency department for further evaluation.   Chest Pain Associated symptoms: shortness of breath        Home Medications Prior to Admission medications   Medication Sig Start Date End Date Taking? Authorizing Provider  amLODipine (NORVASC) 10 MG tablet Take 1 tablet (10 mg total) by mouth daily. 04/20/23   Anders Simmonds, PA-C  atorvastatin (LIPITOR) 80 MG tablet TAKE 1 TABLET BY MOUTH ONCE DAILY AT 6 IN THE EVENING Patient taking differently: Take 80 mg by mouth at bedtime. 11/11/22   Marcine Matar, MD  carvedilol (COREG) 6.25 MG tablet Take 1 tablet (6.25 mg total) by mouth 2 (two) times daily. 04/20/23   Anders Simmonds, PA-C  dapagliflozin propanediol (FARXIGA) 10 MG TABS tablet Take 1 tablet (10 mg total) by mouth daily. 04/20/23   Marcine Matar, MD  ezetimibe (ZETIA) 10 MG tablet Take 1 tablet (10 mg total) by mouth daily. 04/20/23   Anders Simmonds, PA-C   fluticasone (FLONASE) 50 MCG/ACT nasal spray Place 1 spray into both nostrils daily. 04/20/23   Anders Simmonds, PA-C  furosemide (LASIX) 40 MG tablet Per dialysis: 1 to 2 tabs daily depending on swelling(updated dosing) 04/20/23   Anders Simmonds, PA-C  lisinopril (ZESTRIL) 40 MG tablet Take 1 tablet (40 mg total) by mouth daily. 04/20/23   Anders Simmonds, PA-C  potassium chloride SA (KLOR-CON M) 20 MEQ tablet TAKE 1 TABLET BY MOUTH ON MONDAY, Wed, FRIDAY and Saturdays. 04/20/23   Anders Simmonds, PA-C  triamcinolone cream (KENALOG) 0.1 % Apply 1 Application topically daily.    [provider]      Allergies    Other, Plavix [clopidogrel bisulfate], Fleet enema [enema], Lovenox [enoxaparin], Morphine, Nsaids, and Hydrocodone-acetaminophen    Review of Systems   Review of Systems  Respiratory:  Positive for chest tightness and shortness of breath.   Cardiovascular:  Positive for chest pain and leg swelling.  All other systems reviewed and are negative.   Physical Exam Updated Vital Signs BP 105/60   Pulse 75   Temp 97.6 F (36.4 C) (Oral)   Resp (!) 22   SpO2 98%  Physical Exam Vitals and nursing note reviewed.   Gen: NAD Eyes: PERRL, EOMI HEENT: no oropharyngeal swelling Neck: trachea midline Resp: Manage to bilateral lung bases Card: RRR, no murmurs, rubs, or gallops Abd: nontender, nondistended Extremities: 2+ pitting edema of the bilateral lower  extremities with no calf tenderness noted Vascular: 2+ radial pulses bilaterally, 2+ DP pulses bilaterally Skin: no rashes Psyc: acting appropriately   ED Results / Procedures / Treatments   Labs (all labs ordered are listed, but only abnormal results are displayed) Labs Reviewed  BASIC METABOLIC PANEL - Abnormal; Notable for the following components:      Result Value   CO2 19 (*)    Glucose, Bld 115 (*)    BUN 56 (*)    Creatinine, Ser 2.70 (*)    Calcium 8.8 (*)    GFR, Estimated 26 (*)    All other  components within normal limits  CBC - Abnormal; Notable for the following components:   WBC 12.0 (*)    RBC 2.04 (*)    Hemoglobin 6.0 (*)    HCT 19.7 (*)    RDW 22.2 (*)    Platelets 128 (*)    All other components within normal limits  BRAIN NATRIURETIC PEPTIDE - Abnormal; Notable for the following components:   B Natriuretic Peptide 2,097.0 (*)    All other components within normal limits  TROPONIN I (HIGH SENSITIVITY) - Abnormal; Notable for the following components:   Troponin I (High Sensitivity) 26 (*)    All other components within normal limits  TROPONIN I (HIGH SENSITIVITY) - Abnormal; Notable for the following components:   Troponin I (High Sensitivity) 23 (*)    All other components within normal limits  FERRITIN  IRON AND TIBC  TYPE AND SCREEN  PREPARE RBC (CROSSMATCH)    EKG None  Radiology DG Chest Port 1 View  Result Date: 05/17/2023 CLINICAL DATA:  SHORTNESS OF BREATH. CHEST PAIN. LOWER EXTREMITY SWELLING. EXAM: PORTABLE CHEST 1 VIEW COMPARISON:  05/16/2023 FINDINGS: There is a right chest wall dialysis catheter with tips in the distal SVC. Postoperative changes from prior median sternotomy. Heart size appears stable. There is moderate diffuse pulmonary vascular congestion. No frank edema. No pleural fluid or consolidative change. IMPRESSION: Moderate pulmonary vascular congestion. Electronically Signed   By: Signa Kell M.D.   On: 05/17/2023 13:15    Procedures Procedures    Medications Ordered in ED Medications  0.9 %  sodium chloride infusion (Manually program via Guardrails IV Fluids) (has no administration in time range)  acetaminophen (OFIRMEV) IV 1,000 mg (has no administration in time range)  furosemide (LASIX) 120 mg in dextrose 5 % 50 mL IVPB (has no administration in time range)  fentaNYL (SUBLIMAZE) injection 50 mcg (50 mcg Intravenous Given 05/17/23 1106)    ED Course/ Medical Decision Making/ A&P                                 Medical  Decision Making 62 year old male with past medical history of hypertension, hyperlipidemia, and chronic kidney disease presenting to the emergency department today with chest pressure and shortness of breath.  The patient certainly does appear volume overloaded here on exam today.  I will further evaluate him here with basic labs Wels and EKG, chest x-ray, and troponin for further evaluation for ACS, pulmonary edema, pulmonary infiltrates, or pneumothorax.  Will also obtain a type and screen in the event the patient does require transfusion.  Does appear volume overloaded here.  I will obtain labs to evaluate his potassium here as well as his renal function.  He does have a dialysis port in place.  I will discuss case with his nephrologist after his  workup is completed for ultimate disposition.  The patient's EKG interpreted by me shows a sinus rhythm with a rate of 72 with left axis deviation, left anterior fascicular block, and nonspecific ST-T changes.  The patient's hemoglobin here is less than 7.  A transfusion is ordered.  I did call discuss his case with nephrology.  They did recommend giving the patient 120 mg of IV Lasix.  I will hold off until the patient starts receiving the blood as his blood pressures have been in the low 100s and high 90s systolic.  A call is placed to hospitalist service for admission.  They will follow the patient and figure out if he will need further dialysis or simply diuresis here while getting transfused here in the hospital.  Critical care time 40 minutes including reassessments, review of old records, coordination of care with nephrology, coordination of care with hospitalist service, treatment of anemia with blood transfusion.  Amount and/or Complexity of Data Reviewed Labs: ordered. Radiology: ordered.  Risk Prescription drug management. Decision regarding hospitalization.           Final Clinical Impression(s) / ED Diagnoses Final diagnoses:   Anemia, unspecified type  Acute on chronic congestive heart failure, unspecified heart failure type [facility])    Rx / DC Orders ED Discharge Orders     None         Durwin Glaze, MD 05/17/23 1351

## 2023-05-17 NOTE — ED Triage Notes (Signed)
BIB cancer [facility], seen for anemia. Did not infuse today d/t Chest pain for one week, BLE swelling and SOB. Taken off dialysis one week ago for increased kidney function.

## 2023-05-17 NOTE — Progress Notes (Signed)
Symptom Management Consult Note [facility]    Patient Care Team: Marcine Matar, MD as PCP - General (Internal Medicine) Nahser, Deloris Ping, MD as PCP - Cardiology (Cardiology)    Name / MRN / DOB: [name]  [id number]  04-15-1961   Date of visit: 05/17/2023   Chief Complaint/Reason for visit: chest pain, shortness of breath   Current Therapy: transfusion support to keep hemoglobin >8     ASSESSMENT & PLAN: Patient is a 62 y.o. male with pertinent history of multifactorial anemia, combination of bone marrow suppression from severe endocarditis and antibiotics and anemia chronic renal failure followed by Dr. Bertis Ruddy.  I have viewed most recent oncology note and lab work.    #Anemia -Per patient had nephrology appointment yesterday and told his hemoglobin was low. - Next appointment with oncologist is 05/19/23.   #Chest pain and shortness of breath -Patient here for walk in appointment.  BP is soft on arrival and he is tachypneic.  Limited exam performed based on severity of his symptoms needing higher level of care.  Easily able to see lower extremities have 2+ pitting edema. -CBC with significant findings of WBC 11.6, hemoglobin 6.4, platelets 109k. CMP collected, still in process. Unable to get IV access in clinic. -With patient's concerning pmh and new symptoms of CP and SOB with volume overload appearance he will need emergent ED evaluation. -Patient transferred to ED and report given to accepting RN.    Heme/Onc History: Oncology History   No history exists.      Interval history-: [name] is a 62 y.o. male with pertinent history of multifactorial anemia, combination of bone marrow suppression from severe endocarditis and antibiotics and anemia chronic renal failure to [facility] today with chief complaint of chest pain and shortness of breath. Patient presents unaccompanied today.   Patient reports he had follow up visit with his  nephrologist yesterday and had lab work showing anemia.  Patient was advised to seek ED evaluation however did not have transportation until this morning.  He also had a chest x-ray yesterday evening that has not yet been read.  Patient is endorsing chest pain x 1 week, worsened 2 days ago, intermittent, rates pain 8/10.  He describes the pain as feels "like pressure."  Patient is concerned as this feels similar to when he had heart attack in the past he states.  Patient is also endorsing intermittent SOB started Sunday, new orthopnea x 1 week.  He has noticed both of his legs have had worsening swelling x 1 week.  Also reporting diarrhea x approx 2 weeks. 4-5x daily. Has sore from "wiping so much." He admits to chills without fever. These symptoms are not consistent with how he feels when needing a blood transfusion of note patient also discontinued dialysis x 1 week ago. Per last oncology note hemodialysis discontinued because he had signs of kidney function recovery.  No OTC meds taken prior to arrival.   ROS  All other systems are reviewed and are negative for acute change except as noted in the HPI.    Allergies  Allergen Reactions   Other Anaphylaxis    Mushrooms  Not listed on MAR    Plavix [Clopidogrel Bisulfate] Other (See Comments)    TTP Not listed on the [facility]   Fleet Enema [Enema] Other (See Comments)    Unknown reaction   Lovenox [Enoxaparin] Other (See Comments)    Unknown reaction   Morphine Other (See Comments)  Unknown reaction   Nsaids Other (See Comments)    Unknown reaction   Hydrocodone-Acetaminophen Itching     Past Medical History:  Diagnosis Date   Allergy    Anemia    Anxiety    Baker's cyst of knee    Blood transfusion without reported diagnosis    as baby    Coronary artery disease    quadruple bypass - March 2016   GERD (gastroesophageal reflux disease)    Gouty arthritis    "real bad" (01/17/2013)   Heart murmur    Hypercholesteremia     Hypertension    Myocardial infarction (HCC) 2017   Stroke (HCC)    Type II diabetes mellitus (HCC)      Past Surgical History:  Procedure Laterality Date   AORTIC VALVE REPLACEMENT N/A 11/25/2015   Procedure: AORTIC VALVE REPLACEMENT (AVR) USING A MAGNA EASE ;  Surgeon: Alleen Borne, MD;  Location: [facility] OR;  Service: Open Heart Surgery;  Laterality: N/A;   CARDIAC CATHETERIZATION N/A 11/19/2015   Procedure: Right/Left Heart Cath and Coronary Angiography;  Surgeon: Lyn Records, MD;  Location: [facility] INVASIVE CV LAB;  Service: Cardiovascular;  Laterality: N/A;   COLONOSCOPY  2004   CORONARY ARTERY BYPASS GRAFT N/A 11/25/2015   Procedure: CORONARY ARTERY BYPASS GRAFTING (CABG)x4 LIMA-LAD; SVG-OM; SVG-RCA; SVG-DIAG;  Surgeon: Alleen Borne, MD;  Location: [facility] OR;  Service: Open Heart Surgery;  Laterality: N/A;   CYST REMOVAL TRUNK N/A 08/24/2016   Procedure: EXCISION OF BACK ABSCESS;  Surgeon: Jimmye Norman, MD;  Location: [facility];  Service: General;  Laterality: N/A;   FINGER AMPUTATION Right 1980's   3rd & 4th digits "got them mashed" (01/17/2013)   KNEE ARTHROSCOPY Right 1980's   MULTIPLE EXTRACTIONS WITH ALVEOLOPLASTY Bilateral 11/21/2015   Procedure: Extraction of tooth #'s 2,12 with alveoloplasty and gross debridement of remaining dentition;  Surgeon: Charlynne Pander, DDS;  Location: [facility] OR;  Service: Oral Surgery;  Laterality: Bilateral;   TEE WITHOUT CARDIOVERSION N/A 11/20/2015   Procedure: TRANSESOPHAGEAL ECHOCARDIOGRAM (TEE);  Surgeon: Laurey Morale, MD;  Location: [facility] ENDOSCOPY;  Service: Cardiovascular;  Laterality: N/A;   TEE WITHOUT CARDIOVERSION N/A 11/25/2015   Procedure: TRANSESOPHAGEAL ECHOCARDIOGRAM (TEE);  Surgeon: Alleen Borne, MD;  Location: [facility] OR;  Service: Open Heart Surgery;  Laterality: N/A;    Social History   Socioeconomic History   Marital status: Married    Spouse name: Not on file   Number of children: Not on file   Years of education:  Not on file   Highest education level: Not on file  Occupational History   Not on file  Tobacco Use   Smoking status: Never   Smokeless tobacco: Never  Vaping Use   Vaping status: Never Used  Substance and Sexual Activity   Alcohol use: Not Currently    Comment: occasionally   Drug use: Not Currently    Types: Marijuana    Comment: pt states occasionally   Sexual activity: Yes  Other Topics Concern   Not on file  Social History Narrative   Not on file   Social Determinants of Health   Financial Resource Strain: Low Risk  (12/09/2022)   Overall Financial Resource Strain (CARDIA)    Difficulty of Paying Living Expenses: Not hard at all  Food Insecurity: No Food Insecurity (02/26/2023)   Hunger Vital Sign    Worried About Running Out of Food in the Last Year: Never true    Ran  Out of Food in the Last Year: Never true  Transportation Needs: No Transportation Needs (02/26/2023)   PRAPARE - Administrator, Civil Service (Medical): No    Lack of Transportation (Non-Medical): No  Physical Activity: Inactive (12/09/2022)   Exercise Vital Sign    Days of Exercise per Week: 0 days    Minutes of Exercise per Session: 0 min  Stress: No Stress Concern Present (12/09/2022)   [employer] of Occupational Health - Occupational Stress Questionnaire    Feeling of Stress : Not at all  Social Connections: Socially Isolated (11/11/2022)   Social Connection and Isolation Panel [NHANES]    Frequency of Communication with Friends and Family: Never    Frequency of Social Gatherings with Friends and Family: Never    Attends Religious Services: Never    Database administrator or Organizations: No    Attends Banker Meetings: Never    Marital Status: Widowed  Intimate Partner Violence: Not At Risk (02/26/2023)   Humiliation, Afraid, Rape, and Kick questionnaire    Fear of Current or Ex-Partner: No    Emotionally Abused: No    Physically Abused: No    Sexually Abused: No     Family History  Problem Relation Age of Onset   Diabetes Father    Hypertension Father    Hypertension Mother    Hypertension Sister    Hypertension Brother    Hypertension Brother    Hypertension Brother    Colon cancer Neg Hx    Esophageal cancer Neg Hx    Rectal cancer Neg Hx    Stomach cancer Neg Hx    Colon polyps Neg Hx      Current Outpatient Medications:    amLODipine (NORVASC) 10 MG tablet, Take 1 tablet (10 mg total) by mouth daily., Disp: 90 tablet, Rfl: 0   atorvastatin (LIPITOR) 80 MG tablet, TAKE 1 TABLET BY MOUTH ONCE DAILY AT 6 IN THE EVENING (Patient taking differently: Take 80 mg by mouth at bedtime.), Disp: 90 tablet, Rfl: 3   carvedilol (COREG) 6.25 MG tablet, Take 1 tablet (6.25 mg total) by mouth 2 (two) times daily., Disp: 180 tablet, Rfl: 2   dapagliflozin propanediol (FARXIGA) 10 MG TABS tablet, Take 1 tablet (10 mg total) by mouth daily., Disp: 90 tablet, Rfl: 1   ezetimibe (ZETIA) 10 MG tablet, Take 1 tablet (10 mg total) by mouth daily., Disp: 90 tablet, Rfl: 3   fluticasone (FLONASE) 50 MCG/ACT nasal spray, Place 1 spray into both nostrils daily., Disp: 48 g, Rfl: 3   furosemide (LASIX) 40 MG tablet, Per dialysis: 1 to 2 tabs daily depending on swelling(updated dosing), Disp: 180 tablet, Rfl: 1   lisinopril (ZESTRIL) 40 MG tablet, Take 1 tablet (40 mg total) by mouth daily., Disp: 90 tablet, Rfl: 1   potassium chloride SA (KLOR-CON M) 20 MEQ tablet, TAKE 1 TABLET BY MOUTH ON MONDAY, Wed, FRIDAY and Saturdays., Disp: 48 tablet, Rfl: 2   triamcinolone cream (KENALOG) 0.1 %, Apply 1 Application topically daily., Disp: , Rfl:   PHYSICAL EXAM: ECOG FS:2 - Symptomatic, <50% confined to bed    Vitals:   05/17/23 0916  BP: (!) 98/53  Pulse: 73  Resp: (!) 22  Temp: (!) 97.4 F (36.3 C)  TempSrc: Oral  SpO2: 98%  Weight: 200 lb 4.8 oz (90.9 kg)  Height: 5\' 6"  (1.676 m)   Physical Exam Vitals and nursing note reviewed.  Constitutional:  Appearance: He is not ill-appearing or toxic-appearing.  HENT:     Head: Normocephalic.  Eyes:     Conjunctiva/sclera: Conjunctivae normal.  Cardiovascular:     Rate and Rhythm: Normal rate.     Pulses: Normal pulses.     Heart sounds: Normal heart sounds.  Pulmonary:     Comments: Tachypneic Abdominal:     General: There is no distension.  Musculoskeletal:     Cervical back: Normal range of motion.     Right lower leg: 2+ Pitting Edema present.     Left lower leg: 2+ Pitting Edema present.  Skin:    General: Skin is warm and dry.  Neurological:     Mental Status: He is alert.  Psychiatric:        Mood and Affect: Affect is tearful.        LABORATORY DATA: I have reviewed the data as listed    Latest Ref Rng & Units 05/06/2023    7:46 AM 04/22/2023   10:47 AM 04/11/2023    9:15 AM  CBC  WBC 4.0 - 10.5 K/uL 9.0  8.0  8.3   Hemoglobin 13.0 - 17.0 g/dL 7.8  7.1  6.8   Hematocrit 39.0 - 52.0 % 24.3  22.4  21.1   Platelets 150 - 400 K/uL 115  128  132         Latest Ref Rng & Units 04/06/2023   10:24 AM 03/25/2023    1:03 PM 03/12/2023    1:44 PM  CMP  Glucose 70 - 99 mg/dL 638  756  433   BUN 8 - 23 mg/dL 16  19  19    Creatinine 0.61 - 1.24 mg/dL 2.95  1.88  4.16   Sodium 135 - 145 mmol/L 135  136  135   Potassium 3.5 - 5.1 mmol/L 4.1  3.7  3.7   Chloride 98 - 111 mmol/L 98  99  96   CO2 22 - 32 mmol/L 26  26  27    Calcium 8.9 - 10.3 mg/dL 8.7  8.6  8.4   Total Protein 6.5 - 8.1 g/dL 7.7  8.3  8.7   Total Bilirubin 0.3 - 1.2 mg/dL 0.7  0.8  0.6   Alkaline Phos 38 - 126 U/L 75  68  93   AST 15 - 41 U/L 13  18  13    ALT 0 - 44 U/L 8  7  5         RADIOGRAPHIC STUDIES (from last 24 hours if applicable) I have personally reviewed the radiological images as listed and agreed with the findings in the report. No results found.      Visit Diagnosis: 1. Deficiency anemia   2. Atypical chest pain   3. Shortness of breath      No orders of the defined types  were placed in this encounter.   All questions were answered. The patient knows to call the clinic with any problems, questions or concerns. No barriers to learning was detected.  A total of more than 30 minutes were spent on this encounter with face-to-face time and non-face-to-face time, including preparing to see the patient, ordering tests and/or medications, counseling the patient and coordination of care as outlined above.    Thank you for allowing me to participate in the care of this patient.    Shanon Ace, PA-C Department of Hematology/Oncology [facility] at [facility] Phone: [phone]  [phone]  05/17/2023 8:57 AM

## 2023-05-17 NOTE — H&P (Signed)
History and Physical    Patient: [name] MWN:[id number] DOB: [birth year] DOA: 05/17/2023 DOS: the patient was seen and examined on 05/17/2023 PCP: Marcine Matar, MD  Patient coming from: Home  Chief Complaint:  Chief Complaint  Patient presents with   Chest Pain   HPI: [name] is a 62 y.o. 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  He also had choledocholithiasis that progressed to cholangitis requiring ERCP and biliary sphincterotomy who was referred from the cancer center for evaluation of chest pain, bilateral lower extremity edema and dyspnea after he has been off dialysis for the past week following improvement on his kidney function. He denied fever, chills, rhinorrhea, sore throat, wheezing or hemoptysis.  No palpitations, diaphoresis, PND, but positive orthopnea or pitting edema of the lower extremities.  No abdominal pain, nausea, emesis, diarrhea, constipation, melena or hematochezia.  No flank pain, dysuria, frequency or hematuria.  No polyuria, polydipsia, polyphagia or blurred vision.  Lab work: CBC showed white count 12.0, hemoglobin 6.0 g/dL platelets 403.  Troponin was 26 then 23 ng/L.  BNP 2097.0 pg/mL.  BMP with sodium 937, potassium 5.1, chloride 107 and CO2 19 mmol/L with a normal anion gap.  Glucose 115, BUN 56, creatinine 2.7 and calcium 8.8 mg/dL.  Imaging: Portable 1 view chest radiograph with moderate pulmonary vascular congestion.  ED course: Initial vital signs were temperature 97.6, pulse 74, respiration 24, BP 101/61 mmHg O2 sat 98% on room air.  The patient received fentanyl 50 mcg fentanyl IVP and was  started on a furosemide infusion by nephrology.  A unit of PRBC was transfused.   Review of Systems: As mentioned in the history of present illness. All other systems reviewed and are negative. Past Medical History:  Diagnosis Date   Allergy    Anemia    Anxiety    Baker's cyst of knee    Blood transfusion without reported diagnosis    as baby    Coronary artery disease    quadruple bypass - March 2016   GERD (gastroesophageal reflux disease)    Gouty arthritis    "real bad" (01/17/2013)   Heart murmur    Hypercholesteremia    Hypertension    Myocardial infarction (HCC) 2017   Stroke (HCC)    Type II diabetes mellitus (HCC)    Past Surgical History:  Procedure Laterality Date   AORTIC VALVE REPLACEMENT N/A 11/25/2015   Procedure: AORTIC VALVE REPLACEMENT (AVR) USING A MAGNA EASE ;  Surgeon: Alleen Borne, MD;  Location: [facility];  Service: Open Heart Surgery;  Laterality: N/A;   CARDIAC CATHETERIZATION N/A 11/19/2015   Procedure: Right/Left Heart Cath and Coronary Angiography;  Surgeon: Lyn Records, MD;  Location: [facility] INVASIVE CV LAB;  Service: Cardiovascular;  Laterality: N/A;   COLONOSCOPY  2004   CORONARY ARTERY BYPASS GRAFT N/A 11/25/2015   Procedure: CORONARY ARTERY BYPASS GRAFTING (CABG)x4 LIMA-LAD; SVG-OM; SVG-RCA; SVG-DIAG;  Surgeon: Alleen Borne, MD;  Location: [facility] OR;  Service: Open Heart Surgery;  Laterality: N/A;   CYST REMOVAL TRUNK N/A 08/24/2016   Procedure: EXCISION OF BACK ABSCESS;  Surgeon: Jimmye Norman, MD;  Location: [facility];  Service: General;  Laterality:  N/A;   FINGER AMPUTATION Right 1980's   3rd & 4th digits "got them mashed" (01/17/2013)   KNEE ARTHROSCOPY Right 1980's   MULTIPLE EXTRACTIONS WITH ALVEOLOPLASTY Bilateral 11/21/2015   Procedure: Extraction of tooth #'s 2,12 with alveoloplasty and gross debridement of remaining dentition;  Surgeon: Charlynne Pander, DDS;  Location: [facility] OR;  Service: Oral Surgery;  Laterality: Bilateral;    TEE WITHOUT CARDIOVERSION N/A 11/20/2015   Procedure: TRANSESOPHAGEAL ECHOCARDIOGRAM (TEE);  Surgeon: Laurey Morale, MD;  Location: [facility] ENDOSCOPY;  Service: Cardiovascular;  Laterality: N/A;   TEE WITHOUT CARDIOVERSION N/A 11/25/2015   Procedure: TRANSESOPHAGEAL ECHOCARDIOGRAM (TEE);  Surgeon: Alleen Borne, MD;  Location: [facility] OR;  Service: Open Heart Surgery;  Laterality: N/A;   Social History:  reports that he has never smoked. He has never used smokeless tobacco. He reports that he does not currently use alcohol. He reports that he does not currently use drugs after having used the following drugs: Marijuana.  Allergies  Allergen Reactions   Other Anaphylaxis    Mushrooms  Not listed on MAR    Plavix [Clopidogrel Bisulfate] Other (See Comments)    TTP Not listed on the [facility]   Fleet Enema [Enema] Other (See Comments)    Unknown reaction   Lovenox [Enoxaparin] Other (See Comments)    Unknown reaction   Morphine Other (See Comments)    Unknown reaction   Nsaids Other (See Comments)    Unknown reaction   Hydrocodone-Acetaminophen Itching    Family History  Problem Relation Age of Onset   Diabetes Father    Hypertension Father    Hypertension Mother    Hypertension Sister    Hypertension Brother    Hypertension Brother    Hypertension Brother    Colon cancer Neg Hx    Esophageal cancer Neg Hx    Rectal cancer Neg Hx    Stomach cancer Neg Hx    Colon polyps Neg Hx     Prior to Admission medications   Medication Sig Start Date End Date Taking? Authorizing Provider  amLODipine (NORVASC) 10 MG tablet Take 1 tablet (10 mg total) by mouth daily. 04/20/23   Anders Simmonds, PA-C  atorvastatin (LIPITOR) 80 MG tablet TAKE 1 TABLET BY MOUTH ONCE DAILY AT 6 IN THE EVENING Patient taking differently: Take 80 mg by mouth at bedtime. 11/11/22   Marcine Matar, MD  carvedilol (COREG) 6.25 MG tablet Take 1 tablet (6.25 mg total) by mouth 2 (two) times daily. 04/20/23   Anders Simmonds,  PA-C  dapagliflozin propanediol (FARXIGA) 10 MG TABS tablet Take 1 tablet (10 mg total) by mouth daily. 04/20/23   Marcine Matar, MD  ezetimibe (ZETIA) 10 MG tablet Take 1 tablet (10 mg total) by mouth daily. 04/20/23   Anders Simmonds, PA-C  fluticasone (FLONASE) 50 MCG/ACT nasal spray Place 1 spray into both nostrils daily. 04/20/23   Anders Simmonds, PA-C  furosemide (LASIX) 40 MG tablet Per dialysis: 1 to 2 tabs daily depending on swelling(updated dosing) 04/20/23   Anders Simmonds, PA-C  lisinopril (ZESTRIL) 40 MG tablet Take 1 tablet (40 mg total) by mouth daily. 04/20/23   Anders Simmonds, PA-C  potassium chloride SA (KLOR-CON M) 20 MEQ tablet TAKE 1 TABLET BY MOUTH ON MONDAY, Wed, FRIDAY and Saturdays. 04/20/23   Anders Simmonds, PA-C  triamcinolone cream (KENALOG) 0.1 % Apply 1 Application topically daily.    [provider]    Physical Exam: Vitals:  05/17/23 1049 05/17/23 1130 05/17/23 1200 05/17/23 1230  BP: (!) 99/54 92/68 (!) 101/55 105/60  Pulse:      Resp:  14 13 (!) 22  Temp:      TempSrc:      SpO2: 98%      Physical Exam Vitals and nursing note reviewed.  Constitutional:      General: He is awake. He is not in acute distress.    Appearance: He is well-developed. He is ill-appearing. He is not toxic-appearing.  HENT:     Head: Normocephalic.     Nose: No rhinorrhea.     Mouth/Throat:     Mouth: Mucous membranes are moist.  Eyes:     General: No scleral icterus.    Pupils: Pupils are equal, round, and reactive to light.  Neck:     Vascular: No JVD.  Cardiovascular:     Rate and Rhythm: Normal rate and regular rhythm.     Heart sounds: S1 normal and S2 normal.  Pulmonary:     Breath sounds: Examination of the right-lower field reveals rales. Examination of the left-lower field reveals rales. Rales present. No wheezing or rhonchi.  Abdominal:     General: Bowel sounds are normal. There is no distension.     Palpations: Abdomen is soft.      Tenderness: There is no abdominal tenderness.  Musculoskeletal:     Cervical back: Neck supple.     Right lower leg: Pitting Edema present.     Left lower leg: Pitting Edema present.  Skin:    General: Skin is warm and dry.  Neurological:     General: No focal deficit present.     Mental Status: He is alert and oriented to person, place, and time.  Psychiatric:        Mood and Affect: Mood normal.        Behavior: Behavior normal. Behavior is cooperative.     Data Reviewed:  Results are pending, will review when available.  Assessment and Plan: Principal Problem:   Acute on chronic congestive heart failure (HCC)   Volume overload Observation/PCU. Continue supplemental oxygen.   Sodium and fluid restriction. Continue furosemide infusion per nephrology.. Monitor daily weights, intake and output. Hold lisinopril due to soft blood pressures. Check echocardiogram. Nephrology input appreciated.  Active Problems:   GERD (gastroesophageal reflux disease) Calcium antiacid, H2 blocker or PPI as needed. Avoid antiacids with aluminum or magnesium.    Essential hypertension Continue carvedilol 6.25 mg p.o. twice daily. Hold lisinopril. He is on furosemide infusion.    CAD (coronary artery disease) Continue amlodipine, atorvastatin, ezetimibe, carvedilol.    Type 2 diabetes mellitus without complication,  without long-term current use of insulin (HCC) Carbohydrate modified diet. CBG monitoring with RI SS. Check hemoglobin A1c. Hold [place].    OSA on CPAP CPAP at bedtime.    Class 1 obesity Current BMI 32.33 kg/m. Lifestyle modifications. Follow-up with closely PCP and/or bariatric clinic.    Advance Care Planning:   Code Status: Full Code   Consults: Nephrology Darylene Price, MD).  Family Communication:   Severity of Illness: The appropriate patient status for this patient is OBSERVATION. Observation status is judged to be reasonable and necessary in order to  provide the required intensity of service to ensure the patient's safety. The patient's presenting symptoms, physical exam findings, and initial radiographic and laboratory data in the context of their medical condition is felt to place them at decreased risk for further clinical deterioration. Furthermore,  it is anticipated that the patient will be medically stable for discharge from the hospital within 2 midnights of admission.   Author: Bobette Mo, MD 05/17/2023 2:10 PM  For on call review [email or web address].   This document was prepared using Dragon voice recognition software and may contain some unintended transcription errors.

## 2023-05-17 NOTE — Consult Note (Signed)
[name] Admit Date: 05/17/2023 05/17/2023 [name] Requesting Physician:  Rhae Hammock MD  Reason for Consult:  SOB, Anemia, CKD4/5  HPI:  37M with a complicated past history including DM2, hypertension, CAD with history of CABG, bicuspid aortic valve status post AVR 2017, atrial fibrillation, anemia, history of TIA, GERD, gout, hyperlipidemia who developed dialysis dependent acute on chronic kidney disease earlier this year after presenting with features of a TMA likely due to MSSA tricuspid valve endocarditis with septic infarcts to the cerebellum.  This course was complicated by choledocholithiasis progressing to cholangitis status post ERCP and biliary sphincterotomy.  Because of his cardiac arrest he developed AKI from ATN and transferred to outpatient hemodialysis in June of this year.  He has had resistant anemia followed by hematology during this process.    Dialysis was discontinued 05/10/2023 after evidence of recovery GFR.  He still has a tunneled catheter.  He was seen yesterday in our office with complaints of worsening orthopnea and cough, dyspnea on exertion, progressive lower extremity edema.  Plan was for up titration of diuretics.  Yesterday in the office his creatinine was 2.49, BUN 29, potassium 5.4, albumin 3.7, hemoglobin 6.7, platelet count 111.  He presented to the ED today with anemia, having hemoglobin of 6, and progressive exertional dyspnea, orthopnea, peripheral edema.  Here his creatinine is 2.7, K5.1, hemoglobin 6.0. Denies restaurant food, fast food, other high Na foods.  CXR here with moderate pulmonary vascular congestion, indepdently reviewed.   Still has RIJ TDC, bandaged, no reported F/C  PMH Incudes: As above   Creatinine (mg/dL)  Date Value  29/52/8413 2.71 (H)   Creat (mg/dL)  Date Value  24/40/1027 1.05  01/25/2013 0.98  01/16/2013 2.01 (H)   Creatinine, Ser (mg/dL)  Date Value  25/36/6440 2.70 (H)  04/06/2023 1.98 (H)  03/25/2023 2.48 (H)   03/12/2023 3.03 (H)  02/28/2023 5.31 (H)  02/27/2023 4.12 (H)  02/26/2023 5.07 (H)  02/26/2023 5.13 (H)  02/25/2023 5.15 (H)  01/29/2023 3.89 (H)  ]  ROS Balance of 12 systems is negative w/ exceptions as above  PMH  Past Medical History:  Diagnosis Date   Allergy    Anemia    Anxiety    Baker's cyst of knee    Blood transfusion without reported diagnosis    as baby    Coronary artery disease    quadruple bypass - March 2016   GERD (gastroesophageal reflux disease)    Gouty arthritis    "real bad" (01/17/2013)   Heart murmur    Hypercholesteremia    Hypertension    Myocardial infarction (HCC) 2017   Stroke (HCC)    Type II diabetes mellitus (HCC)    PSH  Past Surgical History:  Procedure Laterality Date   AORTIC VALVE REPLACEMENT N/A 11/25/2015   Procedure: AORTIC VALVE REPLACEMENT (AVR) USING A MAGNA EASE ;  Surgeon: Alleen Borne, MD;  Location: [facility] OR;  Service: Open Heart Surgery;  Laterality: N/A;   CARDIAC CATHETERIZATION N/A 11/19/2015   Procedure: Right/Left Heart Cath and Coronary Angiography;  Surgeon: Lyn Records, MD;  Location: [facility] INVASIVE CV LAB;  Service: Cardiovascular;  Laterality: N/A;   COLONOSCOPY  2004   CORONARY ARTERY BYPASS GRAFT N/A 11/25/2015   Procedure: CORONARY ARTERY BYPASS GRAFTING (CABG)x4 LIMA-LAD; SVG-OM; SVG-RCA; SVG-DIAG;  Surgeon: Alleen Borne, MD;  Location: [facility] OR;  Service: Open Heart Surgery;  Laterality: N/A;   CYST REMOVAL TRUNK N/A 08/24/2016   Procedure: EXCISION OF BACK  ABSCESS;  Surgeon: Jimmye Norman, MD;  Location:  SURGERY CENTER;  Service: General;  Laterality: N/A;   FINGER AMPUTATION Right 1980's   3rd & 4th digits "got them mashed" (01/17/2013)   KNEE ARTHROSCOPY Right 1980's   MULTIPLE EXTRACTIONS WITH ALVEOLOPLASTY Bilateral 11/21/2015   Procedure: Extraction of tooth #'s 2,12 with alveoloplasty and gross debridement of remaining dentition;  Surgeon: Charlynne Pander, DDS;  Location[facility] OR;  Service:  Oral Surgery;  Laterality: Bilateral;   TEE WITHOUT CARDIOVERSION N/A 11/20/2015   Procedure: TRANSESOPHAGEAL ECHOCARDIOGRAM (TEE);  Surgeon: Laurey Morale, MD;  Location: [facility] ENDOSCOPY;  Service: Cardiovascular;  Laterality: N/A;   TEE WITHOUT CARDIOVERSION N/A 11/25/2015   Procedure: TRANSESOPHAGEAL ECHOCARDIOGRAM (TEE);  Surgeon: Alleen Borne, MD;  Location: [facility] OR;  Service: Open Heart Surgery;  Laterality: N/A;   FH  Family History  Problem Relation Age of Onset   Diabetes Father    Hypertension Father    Hypertension Mother    Hypertension Sister    Hypertension Brother    Hypertension Brother    Hypertension Brother    Colon cancer Neg Hx    Esophageal cancer Neg Hx    Rectal cancer Neg Hx    Stomach cancer Neg Hx    Colon polyps Neg Hx    SH  reports that he has never smoked. He has never used smokeless tobacco. He reports that he does not currently use alcohol. He reports that he does not currently use drugs after having used the following drugs: Marijuana. Allergies  Allergies  Allergen Reactions   Other Anaphylaxis    Mushrooms  Not listed on MAR    Plavix [Clopidogrel Bisulfate] Other (See Comments)    TTP Not listed on the [facility]   Fleet Enema [Enema] Other (See Comments)    Unknown reaction   Lovenox [Enoxaparin] Other (See Comments)    Unknown reaction   Morphine Other (See Comments)    Unknown reaction   Nsaids Other (See Comments)    Unknown reaction   Hydrocodone-Acetaminophen Itching   Home medications Prior to Admission medications   Medication Sig Start Date End Date Taking? Authorizing Provider  amLODipine (NORVASC) 10 MG tablet Take 1 tablet (10 mg total) by mouth daily. 04/20/23   Anders Simmonds, PA-C  atorvastatin (LIPITOR) 80 MG tablet TAKE 1 TABLET BY MOUTH ONCE DAILY AT 6 IN THE EVENING Patient taking differently: Take 80 mg by mouth at bedtime. 11/11/22   Marcine Matar, MD  carvedilol (COREG) 6.25 MG tablet Take 1 tablet (6.25 mg total)  by mouth 2 (two) times daily. 04/20/23   Anders Simmonds, PA-C  dapagliflozin propanediol (FARXIGA) 10 MG TABS tablet Take 1 tablet (10 mg total) by mouth daily. 04/20/23   Marcine Matar, MD  ezetimibe (ZETIA) 10 MG tablet Take 1 tablet (10 mg total) by mouth daily. 04/20/23   Anders Simmonds, PA-C  fluticasone (FLONASE) 50 MCG/ACT nasal spray Place 1 spray into both nostrils daily. 04/20/23   Anders Simmonds, PA-C  furosemide (LASIX) 40 MG tablet Per dialysis: 1 to 2 tabs daily depending on swelling(updated dosing) 04/20/23   Anders Simmonds, PA-C  lisinopril (ZESTRIL) 40 MG tablet Take 1 tablet (40 mg total) by mouth daily. 04/20/23   Anders Simmonds, PA-C  potassium chloride SA (KLOR-CON M) 20 MEQ tablet TAKE 1 TABLET BY MOUTH ON MONDAY, Wed, FRIDAY and Saturdays. 04/20/23   Anders Simmonds, PA-C  triamcinolone cream (KENALOG) 0.1 %  Apply 1 Application topically daily.    [provider]    Current Medications Scheduled Meds:  sodium chloride   Intravenous Once   Continuous Infusions:  acetaminophen     PRN Meds:.  CBC Recent Labs  Lab 05/17/23 0852 05/17/23 1021  WBC 11.6* 12.0*  NEUTROABS 4.4  --   HGB 6.4* 6.0*  HCT 20.0* 19.7*  MCV 93.9 96.6  PLT 109* 128*   Basic Metabolic Panel Recent Labs  Lab 05/17/23 0950 05/17/23 1021  NA 137 137  K 5.8* 5.1  CL 108 107  CO2 21* 19*  GLUCOSE 110* 115*  BUN 53* 56*  CREATININE 2.71* 2.70*  CALCIUM 9.0 8.8*    Physical Exam  Blood pressure 105/60, pulse 75, temperature 97.6 F (36.4 C), temperature source Oral, resp. rate (!) 22, SpO2 98%. GEN: Sitting up in bed, NAD ENT: NCAT EYES: EOMI CV: MSM at LLSB, regular PULM: crackles b/l, clear in apices ABD: s/nt/nd SKIN: R internal jugular TDC bandaged EXT:3+ LEE b/l, symmetrical NEURO: nonfocal  Assessment 34M recently recovered GFR from dialysis dependent AKI returns ot ED with symptomatic anemia and volume overload with DOE/Orthopnea.  Recent  dialysis dependent AKI, RRT stopped 9/3, now with at least CKD4 AoC HFrEF and hx/o TV IE from MSSA; volume overloaded with DOE and Orthopnea Recurrent/resistant symptomatic anemia, followed by hematology CAD with hx/o CAVG Hx/o MSSA TV IE DM2 HTN  Plan Lasix 120 IV q6h Check Fe and Ferritin Transfusion already arranged BPs are soft, hold ACEi for now.   Will also need ESA in time For now keep [facility], but hopefully we can fix this with adequately dosed diuretics and anemia support Daily weights, Daily Renal Panel, Strict I/Os, Avoid nephrotoxins (NSAIDs, judicious IV Contrast) Will follow closely  [name]  05/17/2023, 1:09 PM

## 2023-05-18 ENCOUNTER — Other Ambulatory Visit (HOSPITAL_COMMUNITY): Payer: PPO

## 2023-05-18 ENCOUNTER — Observation Stay (HOSPITAL_COMMUNITY): Payer: PPO

## 2023-05-18 ENCOUNTER — Inpatient Hospital Stay (HOSPITAL_COMMUNITY): Payer: PPO

## 2023-05-18 DIAGNOSIS — I5043 Acute on chronic combined systolic (congestive) and diastolic (congestive) heart failure: Secondary | ICD-10-CM | POA: Insufficient documentation

## 2023-05-18 DIAGNOSIS — E1122 Type 2 diabetes mellitus with diabetic chronic kidney disease: Secondary | ICD-10-CM | POA: Diagnosis not present

## 2023-05-18 DIAGNOSIS — R0602 Shortness of breath: Secondary | ICD-10-CM | POA: Diagnosis not present

## 2023-05-18 DIAGNOSIS — J984 Other disorders of lung: Secondary | ICD-10-CM | POA: Diagnosis not present

## 2023-05-18 DIAGNOSIS — N183 Chronic kidney disease, stage 3 unspecified: Secondary | ICD-10-CM | POA: Diagnosis not present

## 2023-05-18 DIAGNOSIS — N179 Acute kidney failure, unspecified: Secondary | ICD-10-CM | POA: Diagnosis not present

## 2023-05-18 DIAGNOSIS — K567 Ileus, unspecified: Secondary | ICD-10-CM | POA: Diagnosis not present

## 2023-05-18 DIAGNOSIS — Z951 Presence of aortocoronary bypass graft: Secondary | ICD-10-CM | POA: Diagnosis not present

## 2023-05-18 DIAGNOSIS — I25810 Atherosclerosis of coronary artery bypass graft(s) without angina pectoris: Secondary | ICD-10-CM

## 2023-05-18 DIAGNOSIS — K56609 Unspecified intestinal obstruction, unspecified as to partial versus complete obstruction: Secondary | ICD-10-CM | POA: Diagnosis not present

## 2023-05-18 DIAGNOSIS — I10 Essential (primary) hypertension: Secondary | ICD-10-CM | POA: Diagnosis not present

## 2023-05-18 DIAGNOSIS — Z430 Encounter for attention to tracheostomy: Secondary | ICD-10-CM | POA: Diagnosis not present

## 2023-05-18 DIAGNOSIS — J449 Chronic obstructive pulmonary disease, unspecified: Secondary | ICD-10-CM | POA: Diagnosis not present

## 2023-05-18 DIAGNOSIS — I351 Nonrheumatic aortic (valve) insufficiency: Secondary | ICD-10-CM

## 2023-05-18 DIAGNOSIS — R0489 Hemorrhage from other sites in respiratory passages: Secondary | ICD-10-CM | POA: Diagnosis not present

## 2023-05-18 DIAGNOSIS — G928 Other toxic encephalopathy: Secondary | ICD-10-CM | POA: Diagnosis not present

## 2023-05-18 DIAGNOSIS — N184 Chronic kidney disease, stage 4 (severe): Secondary | ICD-10-CM | POA: Diagnosis not present

## 2023-05-18 DIAGNOSIS — W44F9XA Other object of natural or organic material, entering into or through a natural orifice, initial encounter: Secondary | ICD-10-CM | POA: Diagnosis not present

## 2023-05-18 DIAGNOSIS — R0609 Other forms of dyspnea: Secondary | ICD-10-CM

## 2023-05-18 DIAGNOSIS — Z4682 Encounter for fitting and adjustment of non-vascular catheter: Secondary | ICD-10-CM | POA: Diagnosis not present

## 2023-05-18 DIAGNOSIS — Z930 Tracheostomy status: Secondary | ICD-10-CM | POA: Diagnosis not present

## 2023-05-18 DIAGNOSIS — E8771 Transfusion associated circulatory overload: Secondary | ICD-10-CM | POA: Diagnosis not present

## 2023-05-18 DIAGNOSIS — J9601 Acute respiratory failure with hypoxia: Secondary | ICD-10-CM

## 2023-05-18 DIAGNOSIS — I5023 Acute on chronic systolic (congestive) heart failure: Secondary | ICD-10-CM | POA: Diagnosis not present

## 2023-05-18 DIAGNOSIS — B371 Pulmonary candidiasis: Secondary | ICD-10-CM | POA: Diagnosis not present

## 2023-05-18 DIAGNOSIS — R0989 Other specified symptoms and signs involving the circulatory and respiratory systems: Secondary | ICD-10-CM | POA: Diagnosis not present

## 2023-05-18 DIAGNOSIS — D689 Coagulation defect, unspecified: Secondary | ICD-10-CM | POA: Diagnosis not present

## 2023-05-18 DIAGNOSIS — I130 Hypertensive heart and chronic kidney disease with heart failure and stage 1 through stage 4 chronic kidney disease, or unspecified chronic kidney disease: Secondary | ICD-10-CM | POA: Diagnosis not present

## 2023-05-18 DIAGNOSIS — D631 Anemia in chronic kidney disease: Secondary | ICD-10-CM | POA: Diagnosis not present

## 2023-05-18 DIAGNOSIS — Z48812 Encounter for surgical aftercare following surgery on the circulatory system: Secondary | ICD-10-CM | POA: Diagnosis not present

## 2023-05-18 DIAGNOSIS — D6189 Other specified aplastic anemias and other bone marrow failure syndromes: Secondary | ICD-10-CM | POA: Diagnosis not present

## 2023-05-18 DIAGNOSIS — Y95 Nosocomial condition: Secondary | ICD-10-CM | POA: Diagnosis not present

## 2023-05-18 DIAGNOSIS — G4733 Obstructive sleep apnea (adult) (pediatric): Secondary | ICD-10-CM | POA: Diagnosis not present

## 2023-05-18 DIAGNOSIS — J811 Chronic pulmonary edema: Secondary | ICD-10-CM | POA: Diagnosis not present

## 2023-05-18 DIAGNOSIS — A419 Sepsis, unspecified organism: Secondary | ICD-10-CM | POA: Diagnosis not present

## 2023-05-18 DIAGNOSIS — Z1152 Encounter for screening for COVID-19: Secondary | ICD-10-CM | POA: Diagnosis not present

## 2023-05-18 DIAGNOSIS — R570 Cardiogenic shock: Secondary | ICD-10-CM | POA: Diagnosis not present

## 2023-05-18 DIAGNOSIS — I38 Endocarditis, valve unspecified: Secondary | ICD-10-CM

## 2023-05-18 DIAGNOSIS — R410 Disorientation, unspecified: Secondary | ICD-10-CM | POA: Diagnosis not present

## 2023-05-18 DIAGNOSIS — Z434 Encounter for attention to other artificial openings of digestive tract: Secondary | ICD-10-CM | POA: Diagnosis not present

## 2023-05-18 DIAGNOSIS — I2510 Atherosclerotic heart disease of native coronary artery without angina pectoris: Secondary | ICD-10-CM | POA: Diagnosis not present

## 2023-05-18 DIAGNOSIS — Y831 Surgical operation with implant of artificial internal device as the cause of abnormal reaction of the patient, or of later complication, without mention of misadventure at the time of the procedure: Secondary | ICD-10-CM | POA: Diagnosis present

## 2023-05-18 DIAGNOSIS — N400 Enlarged prostate without lower urinary tract symptoms: Secondary | ICD-10-CM | POA: Diagnosis not present

## 2023-05-18 DIAGNOSIS — I361 Nonrheumatic tricuspid (valve) insufficiency: Secondary | ICD-10-CM

## 2023-05-18 DIAGNOSIS — Z515 Encounter for palliative care: Secondary | ICD-10-CM | POA: Diagnosis not present

## 2023-05-18 DIAGNOSIS — J80 Acute respiratory distress syndrome: Secondary | ICD-10-CM | POA: Diagnosis not present

## 2023-05-18 DIAGNOSIS — K8020 Calculus of gallbladder without cholecystitis without obstruction: Secondary | ICD-10-CM | POA: Diagnosis not present

## 2023-05-18 DIAGNOSIS — I132 Hypertensive heart and chronic kidney disease with heart failure and with stage 5 chronic kidney disease, or end stage renal disease: Secondary | ICD-10-CM | POA: Diagnosis not present

## 2023-05-18 DIAGNOSIS — R079 Chest pain, unspecified: Secondary | ICD-10-CM | POA: Diagnosis not present

## 2023-05-18 DIAGNOSIS — E8779 Other fluid overload: Secondary | ICD-10-CM | POA: Diagnosis not present

## 2023-05-18 DIAGNOSIS — I071 Rheumatic tricuspid insufficiency: Secondary | ICD-10-CM | POA: Diagnosis not present

## 2023-05-18 DIAGNOSIS — D649 Anemia, unspecified: Secondary | ICD-10-CM | POA: Diagnosis not present

## 2023-05-18 DIAGNOSIS — I77810 Thoracic aortic ectasia: Secondary | ICD-10-CM | POA: Diagnosis not present

## 2023-05-18 DIAGNOSIS — J9602 Acute respiratory failure with hypercapnia: Secondary | ICD-10-CM | POA: Diagnosis not present

## 2023-05-18 DIAGNOSIS — Z7189 Other specified counseling: Secondary | ICD-10-CM | POA: Diagnosis not present

## 2023-05-18 DIAGNOSIS — T8111XA Postprocedural  cardiogenic shock, initial encounter: Secondary | ICD-10-CM | POA: Diagnosis not present

## 2023-05-18 DIAGNOSIS — E119 Type 2 diabetes mellitus without complications: Secondary | ICD-10-CM

## 2023-05-18 DIAGNOSIS — E8770 Fluid overload, unspecified: Secondary | ICD-10-CM

## 2023-05-18 DIAGNOSIS — Z992 Dependence on renal dialysis: Secondary | ICD-10-CM | POA: Diagnosis not present

## 2023-05-18 DIAGNOSIS — M7989 Other specified soft tissue disorders: Secondary | ICD-10-CM | POA: Diagnosis not present

## 2023-05-18 DIAGNOSIS — N186 End stage renal disease: Secondary | ICD-10-CM | POA: Diagnosis not present

## 2023-05-18 DIAGNOSIS — I509 Heart failure, unspecified: Secondary | ICD-10-CM | POA: Diagnosis not present

## 2023-05-18 DIAGNOSIS — I9789 Other postprocedural complications and disorders of the circulatory system, not elsewhere classified: Secondary | ICD-10-CM | POA: Diagnosis not present

## 2023-05-18 DIAGNOSIS — E7220 Disorder of urea cycle metabolism, unspecified: Secondary | ICD-10-CM | POA: Diagnosis not present

## 2023-05-18 DIAGNOSIS — B449 Aspergillosis, unspecified: Secondary | ICD-10-CM | POA: Diagnosis not present

## 2023-05-18 DIAGNOSIS — R140 Abdominal distension (gaseous): Secondary | ICD-10-CM | POA: Diagnosis not present

## 2023-05-18 DIAGNOSIS — R6521 Severe sepsis with septic shock: Secondary | ICD-10-CM | POA: Diagnosis not present

## 2023-05-18 DIAGNOSIS — E669 Obesity, unspecified: Secondary | ICD-10-CM | POA: Diagnosis not present

## 2023-05-18 DIAGNOSIS — I719 Aortic aneurysm of unspecified site, without rupture: Secondary | ICD-10-CM | POA: Diagnosis not present

## 2023-05-18 DIAGNOSIS — R739 Hyperglycemia, unspecified: Secondary | ICD-10-CM | POA: Diagnosis not present

## 2023-05-18 DIAGNOSIS — R918 Other nonspecific abnormal finding of lung field: Secondary | ICD-10-CM | POA: Diagnosis not present

## 2023-05-18 DIAGNOSIS — J181 Lobar pneumonia, unspecified organism: Secondary | ICD-10-CM | POA: Diagnosis not present

## 2023-05-18 DIAGNOSIS — E875 Hyperkalemia: Secondary | ICD-10-CM | POA: Diagnosis not present

## 2023-05-18 DIAGNOSIS — I5033 Acute on chronic diastolic (congestive) heart failure: Secondary | ICD-10-CM | POA: Diagnosis not present

## 2023-05-18 DIAGNOSIS — T8201XA Breakdown (mechanical) of heart valve prosthesis, initial encounter: Secondary | ICD-10-CM

## 2023-05-18 DIAGNOSIS — I714 Abdominal aortic aneurysm, without rupture, unspecified: Secondary | ICD-10-CM | POA: Diagnosis not present

## 2023-05-18 DIAGNOSIS — J90 Pleural effusion, not elsewhere classified: Secondary | ICD-10-CM | POA: Diagnosis not present

## 2023-05-18 DIAGNOSIS — Z452 Encounter for adjustment and management of vascular access device: Secondary | ICD-10-CM | POA: Diagnosis not present

## 2023-05-18 DIAGNOSIS — D6959 Other secondary thrombocytopenia: Secondary | ICD-10-CM | POA: Diagnosis not present

## 2023-05-18 DIAGNOSIS — J9690 Respiratory failure, unspecified, unspecified whether with hypoxia or hypercapnia: Secondary | ICD-10-CM | POA: Diagnosis not present

## 2023-05-18 DIAGNOSIS — I129 Hypertensive chronic kidney disease with stage 1 through stage 4 chronic kidney disease, or unspecified chronic kidney disease: Secondary | ICD-10-CM | POA: Diagnosis not present

## 2023-05-18 DIAGNOSIS — I350 Nonrheumatic aortic (valve) stenosis: Secondary | ICD-10-CM | POA: Diagnosis not present

## 2023-05-18 DIAGNOSIS — Z9911 Dependence on respirator [ventilator] status: Secondary | ICD-10-CM | POA: Diagnosis not present

## 2023-05-18 DIAGNOSIS — E43 Unspecified severe protein-calorie malnutrition: Secondary | ICD-10-CM | POA: Diagnosis not present

## 2023-05-18 DIAGNOSIS — Z6832 Body mass index (BMI) 32.0-32.9, adult: Secondary | ICD-10-CM | POA: Diagnosis not present

## 2023-05-18 DIAGNOSIS — J189 Pneumonia, unspecified organism: Secondary | ICD-10-CM | POA: Diagnosis not present

## 2023-05-18 LAB — CBC
HCT: 23.8 % — ABNORMAL LOW (ref 39.0–52.0)
Hemoglobin: 7.6 g/dL — ABNORMAL LOW (ref 13.0–17.0)
MCH: 31 pg (ref 26.0–34.0)
MCHC: 31.9 g/dL (ref 30.0–36.0)
MCV: 97.1 fL (ref 80.0–100.0)
Platelets: 110 10*3/uL — ABNORMAL LOW (ref 150–400)
RBC: 2.45 MIL/uL — ABNORMAL LOW (ref 4.22–5.81)
RDW: 20.7 % — ABNORMAL HIGH (ref 11.5–15.5)
WBC: 12.1 10*3/uL — ABNORMAL HIGH (ref 4.0–10.5)
nRBC: 0 % (ref 0.0–0.2)

## 2023-05-18 LAB — COMPREHENSIVE METABOLIC PANEL
ALT: 8 U/L (ref 0–44)
AST: 13 U/L — ABNORMAL LOW (ref 15–41)
Albumin: 3.5 g/dL (ref 3.5–5.0)
Alkaline Phosphatase: 97 U/L (ref 38–126)
Anion gap: 15 (ref 5–15)
BUN: 61 mg/dL — ABNORMAL HIGH (ref 8–23)
CO2: 16 mmol/L — ABNORMAL LOW (ref 22–32)
Calcium: 9 mg/dL (ref 8.9–10.3)
Chloride: 107 mmol/L (ref 98–111)
Creatinine, Ser: 2.64 mg/dL — ABNORMAL HIGH (ref 0.61–1.24)
GFR, Estimated: 26 mL/min — ABNORMAL LOW (ref >60)
Glucose, Bld: 114 mg/dL — ABNORMAL HIGH (ref 70–99)
Potassium: 5.1 mmol/L (ref 3.5–5.1)
Sodium: 138 mmol/L (ref 135–145)
Total Bilirubin: 2 mg/dL — ABNORMAL HIGH (ref 0.3–1.2)
Total Protein: 8.5 g/dL — ABNORMAL HIGH (ref 6.5–8.1)

## 2023-05-18 LAB — ECHOCARDIOGRAM COMPLETE
AR max vel: 1.39 cm2
AV Peak grad: 22.8 mmHg
Ao pk vel: 2.39 m/s
Height: 66 in
S' Lateral: 4 cm
Weight: 3156.99 [oz_av]

## 2023-05-18 LAB — PHOSPHORUS: Phosphorus: 5.5 mg/dL — ABNORMAL HIGH (ref 2.5–4.6)

## 2023-05-18 LAB — GLUCOSE, CAPILLARY
Glucose-Capillary: 107 mg/dL — ABNORMAL HIGH (ref 70–99)
Glucose-Capillary: 114 mg/dL — ABNORMAL HIGH (ref 70–99)
Glucose-Capillary: 113 mg/dL — ABNORMAL HIGH (ref 70–99)
Glucose-Capillary: 118 mg/dL — ABNORMAL HIGH (ref 70–99)

## 2023-05-18 LAB — ECHO TEE

## 2023-05-18 LAB — PREPARE RBC (CROSSMATCH)

## 2023-05-18 MED ORDER — KETAMINE HCL 50 MG/5ML IJ SOSY
PREFILLED_SYRINGE | INTRAMUSCULAR | Status: AC
  Administered 2023-05-18: 30 mg via INTRAVENOUS
  Filled 2023-05-18: qty 5

## 2023-05-18 MED ORDER — FUROSEMIDE 10 MG/ML IJ SOLN: 20.0000 mg | Freq: Once | INTRAMUSCULAR | Status: DC

## 2023-05-18 MED ORDER — FUROSEMIDE 10 MG/ML IJ SOLN
160.0000 mg | Freq: Four times a day (QID) | INTRAVENOUS | Status: DC
  Filled 2023-05-18 (×3): qty 16

## 2023-05-18 MED ORDER — RIFAMPIN 150 MG PO CAPS: 150.0000 mg | ORAL_CAPSULE | Freq: Every day | ORAL | Status: DC

## 2023-05-18 MED ORDER — ORAL CARE MOUTH RINSE: 15.0000 mL | OROMUCOSAL | Status: DC | PRN

## 2023-05-18 MED ORDER — PRISMASOL BGK 4/2.5 32-4-2.5 MEQ/L REPLACEMENT SOLN: Status: DC

## 2023-05-18 MED ORDER — ANTICOAGULANT SODIUM CITRATE 4% (200MG/5ML) IV SOLN
5.0000 mL | Status: DC | PRN
  Administered 2023-05-20 – 2023-06-03 (×3): 3.8 mL via INTRAVENOUS_CENTRAL
  Filled 2023-05-18 (×6): qty 5

## 2023-05-18 MED ORDER — FENTANYL CITRATE PF 50 MCG/ML IJ SOSY: 50.0000 ug | PREFILLED_SYRINGE | Freq: Once | INTRAMUSCULAR | Status: AC

## 2023-05-18 MED ORDER — ACETAMINOPHEN 325 MG PO TABS: 650.0000 mg | ORAL_TABLET | Freq: Once | ORAL | Status: DC

## 2023-05-18 MED ORDER — KETAMINE HCL 50 MG/5ML IJ SOSY: 30.0000 mg | PREFILLED_SYRINGE | Freq: Once | INTRAMUSCULAR | Status: AC

## 2023-05-18 MED ORDER — METOLAZONE 5 MG PO TABS
5.0000 mg | ORAL_TABLET | Freq: Every day | ORAL | Status: DC
  Filled 2023-05-18: qty 1

## 2023-05-18 MED ORDER — SODIUM CHLORIDE 0.9% IV SOLUTION: Freq: Once | INTRAVENOUS | Status: DC

## 2023-05-18 MED ORDER — PRISMASOL BGK 4/2.5 32-4-2.5 MEQ/L EC SOLN: Status: DC

## 2023-05-18 MED ORDER — VANCOMYCIN HCL IN DEXTROSE 1-5 GM/200ML-% IV SOLN
1000.0000 mg | INTRAVENOUS | Status: DC
  Administered 2023-05-19: 1000 mg via INTRAVENOUS
  Administered 2023-05-20: 1500 mg via INTRAVENOUS
  Administered 2023-05-20 – 2023-06-03 (×15): 1000 mg via INTRAVENOUS
  Filled 2023-05-18 (×16): qty 200

## 2023-05-18 MED ORDER — SODIUM CHLORIDE 0.9 % IV SOLN
2.0000 g | INTRAVENOUS | Status: DC
  Administered 2023-05-18 – 2023-05-22 (×4): 2 g via INTRAVENOUS
  Filled 2023-05-18 (×4): qty 20

## 2023-05-18 MED ORDER — INSULIN ASPART 100 UNIT/ML IJ SOLN: 0.0000 [IU] | INTRAMUSCULAR | Status: DC

## 2023-05-18 MED ORDER — FENTANYL CITRATE PF 50 MCG/ML IJ SOSY
PREFILLED_SYRINGE | INTRAMUSCULAR | Status: AC
  Administered 2023-05-18: 50 ug via INTRAVENOUS
  Filled 2023-05-18: qty 2

## 2023-05-18 MED ORDER — VANCOMYCIN HCL 2000 MG/400ML IV SOLN
2000.0000 mg | Freq: Once | INTRAVENOUS | Status: AC
  Administered 2023-05-18: 2000 mg via INTRAVENOUS
  Filled 2023-05-18: qty 400

## 2023-05-18 MED ORDER — SODIUM CHLORIDE 0.9 % FOR CRRT: INTRAVENOUS_CENTRAL | Status: DC | PRN

## 2023-05-18 MED ORDER — ACETAMINOPHEN 10 MG/ML IV SOLN
1000.0000 mg | Freq: Four times a day (QID) | INTRAVENOUS | Status: AC
  Administered 2023-05-18 (×2): 1000 mg via INTRAVENOUS
  Filled 2023-05-18: qty 100

## 2023-05-18 MED ORDER — SODIUM CHLORIDE 0.9 % IV SOLN: INTRAVENOUS | Status: DC

## 2023-05-18 MED ORDER — MIDAZOLAM HCL 2 MG/2ML IJ SOLN
INTRAMUSCULAR | Status: AC
  Administered 2023-05-18: 3 mg via INTRAVENOUS
  Filled 2023-05-18: qty 4

## 2023-05-18 MED ORDER — MIDAZOLAM HCL 2 MG/2ML IJ SOLN: 3.0000 mg | Freq: Once | INTRAMUSCULAR | Status: AC

## 2023-05-18 NOTE — Consult Note (Addendum)
Cardiology Consult:   Patient ID: [name]; MRN: [id number]; DOB: 12-16-1960   Admission date: 05/17/2023  Primary Care Provider: Marcine Matar, MD Primary Cardiologist: Nahser  Consult question:  Aortic valve Dehiscence     Patient Profile:   [name] is a 62 y.o. male with a history of CAD s/p CABG in 2017 with aortic valve surgery (23 mm Magna Ease for bicuspid valve disease).  Found to have Valve dehiscence today.  History of Present Illness:   [name] has had a prolonged course. Patient has a prior arrest around 01/28/2023.  PEA arrest. This occurred in the setting of MSSA bacteremia.  Found to have evidence of TTP of peripheral smear.  He was transferred to [id number].  Their, he was found to have both tricuspid valve endocarditis (documentation is inconsistent).  CKD worsened and he needed CVVH.  Started on HD.  He did not have TTP.  He has cholangitis and pancreatitis and had ERCP.  He has a cerebellar  Embolic stroke concerning for septic emboli.   Had multiple superficial DVTs as well.  He has recovered from this surprisingly well. He was on 6 weeks of antibiotics.   At HD he was found to have severe asymptomatic anemia.  Came in for evaluation.  For to have hypoxic respiratory failure.  As he still makes urine,was placed on high dose lasix.  He is not planned for RRT (stopped 05/10/2023) and thought to be CKD stage 4.  An echo was performed, found to have aortic valve dehiscence.   He is shaken up at the diagnosis.  He was feeling fine save for fatigue.  At baseline he can still do Adls and take the trash out to the car.  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  He has a son, daugther, and father.    Allergies:    Allergies  Allergen Reactions   Other Anaphylaxis    Mushrooms  Not listed on MAR    Plavix [Clopidogrel Bisulfate] Other (See Comments)    TTP Not listed on the [facility]   Fleet Enema [Enema] Other (See Comments)    Unknown reaction    Lovenox [Enoxaparin] Other (See Comments)    Unknown reaction   Morphine Other (See Comments)    Unknown reaction   Nsaids Other (See Comments)    Unknown reaction   Hydrocodone-Acetaminophen Itching    Social History:   Social History   Socioeconomic History   Marital status: Married    Spouse name: Not on file   Number of children: Not on file   Years of education: Not on file   Highest education level: Not on file  Occupational History   Not on file  Tobacco Use   Smoking status: Never   Smokeless tobacco: Never  Vaping Use   Vaping status: Never Used  Substance and Sexual Activity   Alcohol use: Not Currently    Comment: occasionally   Drug use: Not Currently    Types: Marijuana    Comment: pt states occasionally   Sexual activity: Yes  Other Topics Concern   Not on file  Social History Narrative   Not on file   Social Determinants of Health   Financial Resource Strain: Low Risk  (12/09/2022)   Overall Financial Resource Strain (CARDIA)    Difficulty of Paying Living Expenses: Not hard at all  Food Insecurity: No Food Insecurity (05/17/2023)   Hunger Vital Sign    Worried  About Running Out of Food in the Last Year: Never true    Ran Out of Food in the Last Year: Never true  Transportation Needs: No Transportation Needs (05/17/2023)   PRAPARE - Administrator, Civil Service (Medical): No    Lack of Transportation (Non-Medical): No  Physical Activity: Inactive (12/09/2022)   Exercise Vital Sign    Days of Exercise per Week: 0 days    Minutes of Exercise per Session: 0 min  Stress: No Stress Concern Present (12/09/2022)   [employer] of Occupational Health - Occupational Stress Questionnaire    Feeling of Stress : Not at all  Social Connections: Socially Isolated (11/11/2022)   Social Connection and Isolation Panel [NHANES]    Frequency of Communication with Friends and Family: Never    Frequency of Social Gatherings with Friends and Family:  Never    Attends Religious Services: Never    Database administrator or Organizations: No    Attends Banker Meetings: Never    Marital Status: Widowed  Intimate Partner Violence: Not At Risk (05/17/2023)   Humiliation, Afraid, Rape, and Kick questionnaire    Fear of Current or Ex-Partner: No    Emotionally Abused: No    Physically Abused: No    Sexually Abused: No    Family History:  The patient's family history includes Diabetes in his father; Hypertension in his brother, brother, brother, father, mother, and sister. There is no history of Colon cancer, Esophageal cancer, Rectal cancer, Stomach cancer, or Colon polyps.    ROS:  Please see the history of present illness.   Physical Exam/Data:   Vitals:   05/18/23 1122 05/18/23 1213 05/18/23 1309 05/18/23 1327  BP: (!) 114/94 106/62 106/62 (!) 104/56  Pulse:  77 73 72  Resp:  (!) 22  20  Temp:   (!) 97.2 F (36.2 C) (!) 97.2 F (36.2 C)  TempSrc:   Oral Oral  SpO2:  100% 95% 96%  Weight:      Height:        Intake/Output Summary (Last 24 hours) at 05/18/2023 1700 Last data filed at 05/18/2023 1609 Gross per 24 hour  Intake 1057.38 ml  Output 750 ml  Net 307.38 ml   Filed Weights   05/17/23 2025 05/17/23 2100  Weight: 89.5 kg 89.5 kg   Body mass index is 31.85 kg/m.   Gen: mild distress,  Neck: JVD sitting up Cardiac: No Rubs or Gallops, holosystolic murmur, RRR +2 radial pulses Respiratory: Crackles bilaterally, normal respiratory rate,  GI: Soft, nontender, non-distended  MS: +1 edema;  moves all extremities Integument: Skin feels warm Neuro:  At time of evaluation, alert and oriented to person/place/time/situation  Psych: Normal affect, patient feels anxious   EKG:  The ECG that was done  was personally reviewed and demonstrates SR   Relevant CV Studies:  Cardiac Studies & Procedures   CARDIAC CATHETERIZATION  CARDIAC CATHETERIZATION 11/19/2015  Narrative 1. Mid RCA lesion, 85%  stenosed. 2. RPDA lesion, 75% stenosed. 3. Prox Cx to Dist Cx lesion, 65% stenosed. 4. Ost 2nd Mrg to 2nd Mrg lesion, 60% stenosed. 5. LM lesion, 50% stenosed. 6. 1st Diag lesion, 80% stenosed. 7. Mid LAD lesion, 90% stenosed. 8. Dist LAD lesion, 50% stenosed.   Severe coronary disease with high-grade segmental stenosis in the mid LAD, high-grade obstruction in the first diagonal, significant stenosis in the mid RCA, and eccentric segmental 50% left main.  The circumflex coronary artery contains a proximal  and mid 50-60% stenoses.  Left ventriculography was not performed due to markedly elevated end-diastolic pressures, 46 mmHg.  Severe pulmonary hypertension, measured at 103/53 mmHg  Marked elevation and pulmonary capillary wedge mean pressure 33 mmHg  Known severe aortic regurgitation with hemodynamics compatible with acute on chronic process.  RECOMMENDATIONS:   The patient will be moved to the intensive care unit or a transitional care unit bed  Aggressive diuresis to lower pulmonary pressures and end-diastolic pressure.  Consider advanced heart failure service consultation.  Consider decreasing the dose of beta blocker therapy as this will shorten diastolic left ventricular filling time and may help to lower LVEDP and LV end-diastolic dimension.  Hemodynamically, the patient is severely compromised. CO-OX 53%.  Monitor kidney function. 90 cc of contrast was used for the procedure.  Will need aortic valve replacement and multivessel coronary bypass grafting.  Findings Coronary Findings Diagnostic  Dominance: Co-dominant  Left Main Eccentric.  Left Anterior Descending The lesion is type C Diffuse eccentric. The lesion is type C.  First Diagonal Branch  First Septal Branch The vessel is small in size.  Ramus Intermedius . Vessel is small.  Left Circumflex . Vessel is small. Eccentric.  Second Obtuse Marginal Branch The vessel is large in  size. Eccentric.  Third Obtuse Marginal Branch The vessel is small in size.  Right Coronary Artery The lesion is type C Eccentric.  Right Posterior Descending Artery The lesion is type C.  Intervention  No interventions have been documented.     ECHOCARDIOGRAM  ECHOCARDIOGRAM COMPLETE 05/18/2023  Narrative ECHOCARDIOGRAM REPORT    Patient Name:   RANDEE COODY Date of Exam: 05/18/2023 Medical Rec #:  [id number]     Height:       66.0 in Accession #:    [id number]    Weight:       197.3 lb Date of Birth:  03-Oct-1960      BSA:          1.988 m Patient Age:    62 years      BP:           114/94 mmHg Patient Gender: M             HR:           75 bpm. Exam Location:  Inpatient  Procedure: 2D Echo, Cardiac Doppler and Color Doppler  REPORT CONTAINS CRITICAL RESULT  Indications:    Endocarditis I38 Dyspnea R06.00  History:        Patient has prior history of Echocardiogram examinations, most recent 01/29/2023. Stroke, Aortic Valve Disease, Signs/Symptoms:Dyspnea, Shortness of Breath and Murmur; Risk Factors:Diabetes. Aortic Valve: Edwards MAGNA EASE valve is present in the aortic position.  Sonographer:    Darlys Gales Referring Phys: [id number] [place]  IMPRESSIONS   1. Limited study. Left ventricular ejection fraction, by estimation, is 50 to 55%. The left ventricle has low normal function. The left ventricle has no regional wall motion abnormalities. There is mild left ventricular hypertrophy. Left ventricular diastolic parameters are indeterminate. 2. Right ventricular systolic function is normal. The right ventricular size is normal. 3. The mitral valve is normal in structure. Trivial mitral valve regurgitation. 4. Tricuspid valve regurgitation is mild to moderate. 5. Prosthetic valve non-coronary cusp appears to be dehisced with significant PVL which is new. Aortic valve regurgitation is not visualized. There is a Edwards MAGNA EASE valve present in  the aortic position. 6. Aneurysm of the aortic root, measuring 47 mm.  Conclusion(s)/Recommendation(s): The aortic valve prosthesis appears to be dehisced. Recommend a TEE/ CT SUR. Communicated with the primary team.  FINDINGS Left Ventricle: Limited study. Left ventricular ejection fraction, by estimation, is 50 to 55%. The left ventricle has low normal function. The left ventricle has no regional wall motion abnormalities. The left ventricular internal cavity size was normal in size. There is mild left ventricular hypertrophy. Left ventricular diastolic parameters are indeterminate.  Right Ventricle: The right ventricular size is normal. Right ventricular systolic function is normal.  Left Atrium: Left atrial size was normal in size.  Right Atrium: Right atrial size was normal in size.  Pericardium: There is no evidence of pericardial effusion.  Mitral Valve: The mitral valve is normal in structure. Trivial mitral valve regurgitation.  Tricuspid Valve: Tricuspid valve regurgitation is mild to moderate.  Aortic Valve: Prosthetic valve non-coronary cusp appears to be dehisced with significant PVL which is new. Aortic valve regurgitation is not visualized. Aortic valve peak gradient measures 22.8 mmHg. There is a Edwards MAGNA EASE valve present in the aortic position.  Pulmonic Valve: Pulmonic valve regurgitation is not visualized.  Aorta: There is an aneurysm involving the aortic root measuring 47 mm.  IAS/Shunts: The interatrial septum was not well visualized.   LEFT VENTRICLE PLAX 2D LVIDd:         5.40 cm LVIDs:         4.00 cm LV PW:         1.20 cm LV IVS:        0.80 cm LVOT diam:     2.40 cm LV SV:         89 LV SV Index:   45 LVOT Area:     4.52 cm   LEFT ATRIUM           Index LA Vol (A4C): 54.1 ml 27.21 ml/m AORTIC VALVE AV Area (Vmax): 1.39 cm AV Vmax:        239.00 cm/s AV Peak Grad:   22.8 mmHg LVOT Vmax:      73.30 cm/s LVOT Vmean:     59.800  cm/s LVOT VTI:       0.196 m  AORTA Ao Root diam: 4.70 cm  TRICUSPID VALVE TR Peak grad:   47.1 mmHg TR Vmax:        343.00 cm/s  SHUNTS Systemic VTI:  0.20 m Systemic Diam: 2.40 cm  Carolan Clines Electronically signed by Carolan Clines Signature Date/Time: 05/18/2023/3:37:07 PM    Final   TEE  ECHO TEE 11/20/2015  Narrative *[place]* *[facility]* [address] [place], [place] [address] [phone]  ------------------------------------------------------------------- Transesophageal Echocardiography  Patient:    [name], [name] MR #:       [id number] Study Date: 11/20/2015 Gender:     M Age:        55 Height:     162.6 cm Weight:     99.7 kg BSA:        2.17 m^2 Pt. Status: Room:       2S12C  Hilma Favors, MD ATTENDING    Evelene Croon, MD ADMITTING    Daiva Eves, [place] N PERFORMING   [name], M.D. SONOGRAPHER  Sheralyn Boatman  cc:  ------------------------------------------------------------------- LV EF: 60%  ------------------------------------------------------------------- Indications:      Biscuspid aortic valve 746.4.  Murmur 785.2.  ------------------------------------------------------------------- History:   PMH:   Congestive heart failure.  Risk factors: Hypertension. Diabetes mellitus. Dyslipidemia.  -------------------------------------------------------------------  Study Conclusions  - Left ventricle: The cavity size was mildly dilated. Wall thickness was increased in a pattern of mild LVH. The estimated ejection fraction was 60%. Wall motion was normal; there were no regional wall motion abnormalities. - Aortic valve: Bicuspid aortic valve with mean gradient 10 mmHg across the valve (likely due to high flow, did not appear significantly stenosed) and severe regurgitation. There was holodiastolic flow reversal in the ascending thoracic aorta. - Aorta: Aortic root dilated to 3.8 cm, normal caliber  ascending aorta. - Mitral valve: There was trivial regurgitation. - Left atrium: The atrium was mildly dilated. There was smoke but no definite thrombus in the left atrial appendage. - Right ventricle: The cavity size was normal. Systolic function was normal. - Right atrium: No evidence of thrombus in the atrial cavity or appendage. - Atrial septum: There was a small PFO noted by color doppler with crossing of a couple of bubbles (difficult bubble study). - Tricuspid valve: Peak RV-RA gradient 35 mmHg.  Impressions:  - Severe aortic insufficiency with bicuspid valve.  Diagnostic transesophageal echocardiography.  2D and color Doppler. Birthdate:  Patient birthdate: [birth year].  Age:  Patient is 62 yr old.  Sex:  Gender: male.    BMI: 37.7 kg/m^2.  Blood pressure: 178/67  Patient status:  Outpatient.  Study date:  Study date: 11/20/2015. Study time: 07:45 AM.  Location:  Endoscopy.  -------------------------------------------------------------------  ------------------------------------------------------------------- Left ventricle:  The cavity size was mildly dilated. Wall thickness was increased in a pattern of mild LVH. The estimated ejection fraction was 60%. Wall motion was normal; there were no regional wall motion abnormalities.  ------------------------------------------------------------------- Aortic valve:  Bicuspid aortic valve with mean gradient 10 mmHg across the valve (likely due to high flow, did not appear significantly stenosed) and severe regurgitation. There was holodiastolic flow reversal in the ascending thoracic aorta. Doppler:     Mean gradient (S): 10 mm Hg. Peak gradient (S): 18 mm Hg.  ------------------------------------------------------------------- Aorta:  Aortic root dilated to 3.8 cm, normal caliber ascending aorta.  ------------------------------------------------------------------- Mitral valve:   Doppler:   There was no evidence for  stenosis. There was trivial regurgitation.  ------------------------------------------------------------------- Left atrium:  The atrium was mildly dilated. There was smoke but no definite thrombus in the left atrial appendage.  ------------------------------------------------------------------- Atrial septum:  There was a small PFO noted by color doppler with crossing of a couple of bubbles (difficult bubble study).  ------------------------------------------------------------------- Right ventricle:  The cavity size was normal. Systolic function was normal.  ------------------------------------------------------------------- Pulmonic valve:    Structurally normal valve.   Cusp separation was normal.  ------------------------------------------------------------------- Tricuspid valve:   Doppler:  There was trivial regurgitation. Peak RV-RA gradient 35 mmHg.  ------------------------------------------------------------------- Right atrium:  The atrium was normal in size.  No evidence of thrombus in the atrial cavity or appendage.  ------------------------------------------------------------------- Pericardium:  There was no pericardial effusion.  ------------------------------------------------------------------- Post procedure conclusions Ascending Aorta:  - Aortic root dilated to 3.8 cm, normal caliber ascending aorta.  ------------------------------------------------------------------- Measurements  Aortic valve                       Value Aortic valve peak velocity, S      210   cm/s Aortic valve mean velocity, S      139   cm/s Aortic valve VTI, S                45.5  cm Aortic mean gradient, S  10    mm Hg Aortic peak gradient, S            18    mm Hg  Legend: (L)  and  (H)  mark values outside specified reference range.  ------------------------------------------------------------------- Prepared and Electronically Authenticated by  [name],  M.D. 2017-03-21T23:39:41   MONITORS  CARDIAC EVENT MONITOR 04/25/2018  Narrative  Sinus rhythm  No significant arrhythmias           Laboratory Data:  Chemistry Recent Labs  Lab 05/17/23 1021 05/18/23 0411  NA 137 138  K 5.1 5.1  CL 107 107  CO2 19* 16*  GLUCOSE 115* 114*  BUN 56* 61*  CREATININE 2.70* 2.64*  CALCIUM 8.8* 9.0  GFRNONAA 26* 26*  ANIONGAP 11 15    Recent Labs  Lab 05/17/23 0950 05/18/23 0411  PROT 8.2* 8.5*  ALBUMIN 3.5 3.5  AST 14* 13*  ALT <5 8  ALKPHOS 98 97  BILITOT 1.5* 2.0*   Hematology Recent Labs  Lab 05/17/23 1021 05/17/23 2116 05/18/23 0411  WBC 12.0*  --  12.1*  RBC 2.04*  --  2.45*  HGB 6.0* 7.6* 7.6*  HCT 19.7* 24.2* 23.8*  MCV 96.6  --  97.1  MCH 29.4  --  31.0  MCHC 30.5  --  31.9  RDW 22.2*  --  20.7*  PLT 128*  --  110*   Cardiac EnzymesNo results for input(s): "TROPONINI" in the last 168 hours. No results for input(s): "TROPIPOC" in the last 168 hours.  BNP Recent Labs  Lab 05/17/23 1100  BNP 2,097.0*    DDimer No results for input(s): "DDIMER" in the last 168 hours.   Assessment and Plan:   Prosthetic Aortic Valve Dehiscence Complicated by prior MSSA bacteremia and PEA arrest - Originally concerned that when he had infective endocarditis for the tricuspid valve that there was also prosthetic valve infection; leading to dehiscence- addendum; there is no evidence of abscess on TEE and he may just have related to annular dilation - this is a high risk surgery, I am not clear that he will be a candidate for intervention - chronically elevated WBC.  I have repeated blood cultures - TCTS consulted, they will meet me in 2H  - pending ICU transfer - discussed with patient and father, consented for TEE - will call palliative care when able;   [facility] has been requested to perform a transesophageal echocardiogram on [name] for aortic valve dehiscence.  After careful review of history and  examination, the risks and benefits of transesophageal echocardiogram have been explained including risks of esophageal damage, perforation (1:10,000 risk), bleeding, pharyngeal hematoma as well as other potential complications associated with conscious sedation including aspiration, arrhythmia, respiratory failure and death. Alternatives to treatment were discussed, questions were answered. Patient is willing to proceed. NPO since Noon.  Critical care to assist with anesthesia.  Acute volume overload CKD vs ESRD - will add metolazone.  Appreciate help with nephrology recs  Hx of strokes - on statin and zetia  Multi-factorial anemia HX of DVTs - no AC; appreciate heme recs, sent LDL and haptoglobin as I am concerned he is hemolyzing.  DM - NPO now; appreciate TRH glucose management  CAD - continue current therapy for now, will not do secondary ischemic work up  Full Code for now  We will take over his care as primary while he is in 2H. - if he goes to surgery, we will consult with TRH partners -  if he is not a surgical candidate, we will get palliative care involved, when returning to the floor will return to [facility] service   CRITICAL CARE Performed by: Rivers Gassmann A Uvaldo Rybacki  Total critical care time: 118 minutes. Critical care time was exclusive of separately billable procedures and treating other patients. Critical care was necessary to treat or prevent imminent or life-threatening deterioration. Critical care was time spent personally by me on the following activities: development of treatment plan with patient and/or surrogate as well as nursing, discussions with consultants, evaluation of patient's response to treatment, examination of patient, obtaining history from patient or surrogate, ordering and performing treatments and interventions, ordering and review of laboratory studies, ordering and review of radiographic studies, pulse oximetry and re-evaluation of patient's  condition.    Signed, Riley Lam, MD FASE [facility]  [facility]  05/18/2023 5:39 PM    Tolerated TEE well. PCCM to assist in management, appreciate support. He is waking up at this time. Discussed with TRH- will get HD and optimized prior to decision for surgery. Attempted to call his father Ragan Sladky at [phone] His voicemail is full and not accepting new calls.  Daughter notes he has been feeling for one week.  Will add vanc and ceftriaxone after consult.  ID consult in AM Will ask if we can extend hours for daugther  .CRITICAL CARE Performed by: Laiklynn Raczynski A Haset [place]  Total critical care time: 30 minutes. Critical care time was exclusive of separately billable procedures and treating other patients. Critical care was necessary to treat or prevent imminent or life-threatening deterioration. Critical care was time spent personally by me on the following activities: development of treatment plan with patient and/or surrogate as well as nursing, discussions with consultants, evaluation of patient's response to treatment, examination of patient, obtaining history from patient or surrogate, ordering and performing treatments and interventions, ordering and review of laboratory studies, ordering and review of radiographic studies, pulse oximetry and re-evaluation of patient's condition.    Signed, Riley Lam, MD FASE [facility] [place]  [facility]  05/18/2023 7:02 PM

## 2023-05-18 NOTE — Evaluation (Signed)
Occupational Therapy Evaluation Patient Details Name: [name] MRN: [id number] DOB: 15-Sep-1960 Today's Date: 05/18/2023   History of Present Illness 62 yo male presents to therapy following hospital admission on 05/17/2023 due to progressive weakness,B LE edema, SOB and angina. Pt recently stopped dialysis and symptoms presented since that time. Pt was found to be anemic requiring I unit PRBC. Pt was hospitalized in June of this year with a nosebleed and presented to ED from a SNF 6/21. 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   Clinical Impression   Patient is a 62 year old male who was admitted for above. Patient was living at home at cane level with help from his significant other. Patient was CGA for mobility in room with max A for O2 cord management. Patient was noted to have decreased functional activity tolerance, decreased endurance, decreased standing balance, decreased safety awareness, and decreased knowledge of AD/AE impacting participation in ADLs. Plan to d/c home with [facility] and family support. Patient would continue to benefit from skilled OT services at this time while admitted and after d/c to address noted deficits in order to improve overall safety and independence in ADLs.        If plan is discharge home, recommend the following: A little help with walking and/or transfers;A little help with bathing/dressing/bathroom;Direct supervision/assist for medications management;Assistance with cooking/housework;Assist for transportation;Help with stairs or ramp for entrance;Direct supervision/assist for financial management    Functional Status Assessment  Patient has had a recent decline in their functional status and demonstrates the ability to make significant improvements in function in a reasonable  and predictable amount of time.  Equipment Recommendations  None recommended by OT    Recommendations for Other Services       Precautions / Restrictions Precautions Precautions: Fall Precaution Comments: monitor O2 Restrictions Weight Bearing Restrictions: No      Mobility Bed Mobility               General bed mobility comments: patient was up in recliner and retured to the same at end of session.    Transfers                          Balance Overall balance assessment: Mild deficits observed, not formally tested                                         ADL either performed or assessed with clinical judgement   ADL Overall ADL's : Needs assistance/impaired Eating/Feeding: NPO   Grooming: Supervision/safety;Sitting   Upper Body Bathing: Supervision/ safety;Sitting   Lower Body Bathing: Sitting/lateral leans;Sit to/from stand;Maximal assistance Lower Body Bathing Details (indicate cue type and reason): cant complete figure four positioning sitting. noted to have increased SOB with bending. Upper Body Dressing : Supervision/safety;Sitting;Standing   Lower Body Dressing: Maximal assistance;Sitting/lateral leans   Toilet Transfer: Contact guard assist;Ambulation;Rolling walker (2 wheels)   Toileting- Clothing Manipulation and Hygiene: Contact guard assist;Sit to/from stand Toileting - Clothing Manipulation Details (indicate cue type and reason): quick to get SOB             Vision         Perception  Praxis         Pertinent Vitals/Pain Pain Assessment Pain Assessment: No/denies pain     Extremity/Trunk Assessment Upper Extremity Assessment Upper Extremity Assessment: Overall WFL for tasks assessed   Lower Extremity Assessment Lower Extremity Assessment: Defer to PT evaluation   Cervical / Trunk Assessment Cervical / Trunk Assessment: Normal   Communication Communication Communication: No apparent  difficulties   Cognition Arousal: Alert Behavior During Therapy: Flat affect                                   General Comments: patient knows date. patient seemed to have poor insight on IV pole and o2 COrd managemnet.     General Comments  B LE edema    Exercises     Shoulder Instructions      Home Living Family/patient expects to be discharged to:: Private residence Living Arrangements: Spouse/significant other Available Help at Discharge: Family;Available PRN/intermittently Type of Home: House Home Access: Ramped entrance     Home Layout: One level     Bathroom Shower/Tub: Chief Strategy Officer: Standard Bathroom Accessibility: Yes   Home Equipment: Agricultural consultant (2 wheels);Cane - single point          Prior Functioning/Environment Prior Level of Function : Needs assist               ADLs Comments: patient lives at home with "old lady" who helps him PRN for ADLs. patient reported using RW or cane to get around depending on how he is feeling.        OT Problem List: Decreased activity tolerance;Impaired balance (sitting and/or standing);Decreased coordination;Decreased safety awareness;Decreased knowledge of precautions;Decreased knowledge of use of DME or AE;Increased edema      OT Treatment/Interventions: Self-care/ADL training;Energy conservation;Therapeutic exercise;DME and/or AE instruction;Therapeutic activities;Patient/family education;Balance training    OT Goals(Current goals can be found in the care plan section) Acute Rehab OT Goals Patient Stated Goal: to get better OT Goal Formulation: With patient Time For Goal Achievement: 06/01/23 Potential to Achieve Goals: Fair  OT Frequency: Min 1X/week    Co-evaluation   Reason for Co-Treatment: Complexity of the patient's impairments (multi-system involvement);To address functional/ADL transfers PT goals addressed during session: Mobility/safety with  mobility;Balance;Proper use of DME OT goals addressed during session: ADL's and self-care;Proper use of Adaptive equipment and DME      AM-PAC OT "6 Clicks" Daily Activity     Outcome Measure Help from another person eating meals?: None Help from another person taking care of personal grooming?: None Help from another person toileting, which includes using toliet, bedpan, or urinal?: A Little Help from another person bathing (including washing, rinsing, drying)?: A Little Help from another person to put on and taking off regular upper body clothing?: A Little Help from another person to put on and taking off regular lower body clothing?: A Lot 6 Click Score: 19   End of Session Equipment Utilized During Treatment: Gait belt;Other (comment) (personal cane) Nurse Communication: Other (comment) (ok to see patient)  Activity Tolerance: Patient tolerated treatment well Patient left: in chair;with call bell/phone within reach  OT Visit Diagnosis: Unsteadiness on feet (R26.81);Other abnormalities of gait and mobility (R26.89)                Time: 4742-5956 OT Time Calculation (min): 13 min Charges:  OT General Charges $OT Visit: 1 Visit OT Evaluation $OT Eval Low Complexity: 1  Low  Rosalio Loud, MS Acute Rehabilitation Department Office# [phone]   Selinda Flavin 05/18/2023, 1:55 PM

## 2023-05-18 NOTE — Progress Notes (Signed)
Notified by cardiology, Dr. Wyline Mood that  patient's valve looks dehisced. She recommend a TEE and urgent CT SUR involvement.  Called cardiothoracic surgery for consult and left voicemail with patient's info and my callback number.  Also added on-call cardiothoracic surgeon, Dr. Laneta Simmers to secure chat trend

## 2023-05-18 NOTE — Consult Note (Signed)
NAME:  [name], MRN:  [id number], DOB:  20-Aug-1961, LOS: 0 ADMISSION DATE:  05/17/2023, CONSULTATION DATE:  9/11 REFERRING MD:  Dr. Izora Ribas, CHIEF COMPLAINT:  aortic valve dehiscence   History of Present Illness:  Patient is [name] w/ pertinent PMH CAD s/p CABG 2017 w/ AVR (magna ease for bicuspid valve disease), HLD, HTN, prior CVA, T2DM, CKD4 was previously on HD presents to [facility] on 9/10 chest pain.  Patient recently admitted to [facility] on 5/25 w/ AKI and AMS. While in ED patient had PEA arrest w/ ROSC in about 8 minutes. Patient required dialysis on this admission. Also w/ MSSA bacteremia associated w/ tricuspid valve endocarditis. Patient transferred to [facility] on 5/25. Treated w/ 6 week course oxacillin and rifampin. This admission also suffered a R cerebellar CVA w/ MRI likely septic emboli and had cholangitis/pancreatitis requiring ERCP.  On 9/10 patient admitted to [facility] w/ chest pain, dyspnea, and BLE edema. BNP 2,097. CXR w/ pulm edema. Patient started on IV lasix infusion. Cards consulted. On 9/11 patient transferred to [facility]. Patient's echo showing possible aortic valve dehiscence. Cultures repeated and started on rocephin. Patient transferred to ICU for TEE and general anesthesia and to start CRRT. PCCM consulted.  Pertinent  Medical History   Past Medical History:  Diagnosis Date   Allergy    Anemia    Anxiety    Baker's cyst of knee    Blood transfusion without reported diagnosis    as baby    Coronary artery disease    quadruple bypass - March 2016   GERD (gastroesophageal reflux disease)    Gouty arthritis    "real bad" (01/17/2013)   Heart murmur    Hypercholesteremia    Hypertension    MSSA bacteremia 01/29/2023   Myocardial infarction (HCC) 2017   PEA (Pulseless electrical activity) (HCC) 01/29/2023   Stroke (HCC)    Type II diabetes mellitus (HCC)      Significant Hospital Events: Including procedures, antibiotic start and stop dates  in addition to other pertinent events   9/10 admitted 9/11 echo showing aortic valve dehiscence  Interim History / Subjective:  On 15 L salter [place] sats 93%  Objective   Blood pressure 123/61, pulse 80, temperature (!) 97.2 F (36.2 C), temperature source Oral, resp. rate (!) 22, height 5\' 6"  (1.676 m), weight 89.5 kg, SpO2 97%.        Intake/Output Summary (Last 24 hours) at 05/18/2023 1917 Last data filed at 05/18/2023 1655 Gross per 24 hour  Intake 1163.38 ml  Output 450 ml  Net 713.38 ml   Filed Weights   05/17/23 2025 05/17/23 2100  Weight: 89.5 kg 89.5 kg    Examination: General: NAD HEENT: MM pink/moist; Adrian in place Neuro: Aox3; MAE CV: s1s2, RRR, diminished diastolic murmur PULM:  dim clear BS bilaterally; salter [place] 15 L/m; RR 30 GI: soft, bsx4 active  Extremities: warm/dry, ble edema L>R; r tunneled HD Skin: no rashes or lesions appreciated   Resolved Hospital Problem list     Assessment & Plan:  Prosthetic aortic valve dehiscence w/ mod-severe AVR -recent MSSA bacteremia on 01/2023 P: -Cards following; appreciate recs -CTS consulted though high risk for surgery -trend wbc/fever curve -follow cultures -cont rocephin and vanc -palliative care consulted  Acute respiratory failure w/ hypoxia Volume overload Hx of OSA: refuses to wear cpap P: -CRRT for volume removal -will start on heated HFNC for sats >92% -pulm toilet: IS/flutter -CXR in am  Acute on chronic combined CHF P: -cards following; appreciate recs -trend cvp -cards and nephro following; starting on crrt; consider stopping diuretics -will hold coreg while normotensive  CKD 4 -recently on dialysis P: -cont CRRT per nephro -consider holding diuretics -Trend BMP / urinary output -Replace electrolytes as indicated -Avoid nephrotoxic agents, ensure adequate renal perfusion  Acute on chronic anemia Thrombocytopenia -followed by heme onc outpt; baseline hgb around 7 P: -heme  following; appreciate help -trend cbc -SCDs for dvt ppx  L>R BLE swelling P: -check dvt US  CAD s/p CABG in 2017 w/ AVR HTN HLD P: -statin and zetia -hold coreg  Hx of CVA P: -statin and zetia  DMT2 P: -ssi and cbg monitoring   Best Practice (right click and "Reselect all SmartList Selections" daily)   Diet/type: NPO DVT prophylaxis: SCD GI prophylaxis: N/A Lines: Central line and Dialysis Catheter Foley:  Yes, and it is still needed Code Status:  full code Last date of multidisciplinary goals of care discussion [pending]  Labs   CBC: Recent Labs  Lab 05/17/23 0852 05/17/23 1021 05/17/23 2116 05/18/23 0411  WBC 11.6* 12.0*  --  12.1*  NEUTROABS 4.4  --   --   --   HGB 6.4* 6.0* 7.6* 7.6*  HCT 20.0* 19.7* 24.2* 23.8*  MCV 93.9 96.6  --  97.1  PLT 109* 128*  --  110*    Basic Metabolic Panel: Recent Labs  Lab 05/17/23 0950 05/17/23 1021 05/18/23 0411  NA 137 137 138  K 5.8* 5.1 5.1  CL 108 107 107  CO2 21* 19* 16*  GLUCOSE 110* 115* 114*  BUN 53* 56* 61*  CREATININE 2.71* 2.70* 2.64*  CALCIUM 9.0 8.8* 9.0  PHOS  --   --  5.5*   GFR: Estimated Creatinine Clearance: 30 mL/min (A) (by C-G formula based on SCr of 2.64 mg/dL (H)). Recent Labs  Lab 05/17/23 0852 05/17/23 1021 05/18/23 0411  WBC 11.6* 12.0* 12.1*    Liver Function Tests: Recent Labs  Lab 05/17/23 0950 05/18/23 0411  AST 14* 13*  ALT <5 8  ALKPHOS 98 97  BILITOT 1.5* 2.0*  PROT 8.2* 8.5*  ALBUMIN 3.5 3.5   No results for input(s): "LIPASE", "AMYLASE" in the last 168 hours. No results for input(s): "AMMONIA" in the last 168 hours.  ABG    Component Value Date/Time   PHART 7.416 01/29/2023 1615   PCO2ART 42.3 01/29/2023 1615   PO2ART 116 (H) 01/29/2023 1615   HCO3 27.3 01/29/2023 1615   TCO2 29 01/29/2023 1615   ACIDBASEDEF 4.0 (H) 01/29/2023 1221   O2SAT 99 01/29/2023 1615     Coagulation Profile: No results for input(s): "INR", "PROTIME" in the last 168  hours.  Cardiac Enzymes: No results for input(s): "CKTOTAL", "CKMB", "CKMBINDEX", "TROPONINI" in the last 168 hours.  HbA1C: HbA1c, POC (controlled diabetic range)  Date/Time Value Ref Range Status  12/13/2022 09:58 AM 5.9 0.0 - 7.0 % Final  08/12/2022 08:49 AM 5.8 0.0 - 7.0 % Final   Hgb A1c MFr Bld  Date/Time Value Ref Range Status  02/26/2023 08:25 AM 6.2 (H) 4.8 - 5.6 % Final    Comment:    (NOTE) Pre diabetes:          5.7%-6.4%  Diabetes:              >6.4%  Glycemic control for   <7.0% adults with diabetes     CBG: Recent Labs  Lab 05/17/23 2027 05/18/23 0732 05/18/23  1117 05/18/23 1639  GLUCAP 122* 107* 114* 113*    Review of Systems:   Review of Systems  Constitutional:  Negative for fever.  Respiratory:  Positive for shortness of breath. Negative for sputum production and wheezing.   Cardiovascular:  Positive for chest pain and leg swelling.  Gastrointestinal:  Negative for abdominal pain, nausea and vomiting.     Past Medical History:  He,  has a past medical history of Allergy, Anemia, Anxiety, Baker's cyst of knee, Blood transfusion without reported diagnosis, Coronary artery disease, GERD (gastroesophageal reflux disease), Gouty arthritis, Heart murmur, Hypercholesteremia, Hypertension, MSSA bacteremia (01/29/2023), Myocardial infarction (HCC) (2017), PEA (Pulseless electrical activity) (HCC) (01/29/2023), Stroke (HCC), and Type II diabetes mellitus (HCC).   Surgical History:   Past Surgical History:  Procedure Laterality Date   AORTIC VALVE REPLACEMENT N/A 11/25/2015   Procedure: AORTIC VALVE REPLACEMENT (AVR) USING A MAGNA EASE ;  Surgeon: Alleen Borne, MD;  Location: [facility] OR;  Service: Open Heart Surgery;  Laterality: N/A;   CARDIAC CATHETERIZATION N/A 11/19/2015   Procedure: Right/Left Heart Cath and Coronary Angiography;  Surgeon: Lyn Records, MD;  Location: [facility] INVASIVE CV LAB;  Service: Cardiovascular;  Laterality: N/A;   COLONOSCOPY   2004   CORONARY ARTERY BYPASS GRAFT N/A 11/25/2015   Procedure: CORONARY ARTERY BYPASS GRAFTING (CABG)x4 LIMA-LAD; SVG-OM; SVG-RCA; SVG-DIAG;  Surgeon: Alleen Borne, MD;  Location: [facility] OR;  Service: Open Heart Surgery;  Laterality: N/A;   CYST REMOVAL TRUNK N/A 08/24/2016   Procedure: EXCISION OF BACK ABSCESS;  Surgeon: Jimmye Norman, MD;  Location: [facility];  Service: General;  Laterality: N/A;   FINGER AMPUTATION Right 1980's   3rd & 4th digits "got them mashed" (01/17/2013)   KNEE ARTHROSCOPY Right 1980's   MULTIPLE EXTRACTIONS WITH ALVEOLOPLASTY Bilateral 11/21/2015   Procedure: Extraction of tooth #'s 2,12 with alveoloplasty and gross debridement of remaining dentition;  Surgeon: Charlynne Pander, DDS;  Location: [facility] OR;  Service: Oral Surgery;  Laterality: Bilateral;   TEE WITHOUT CARDIOVERSION N/A 11/20/2015   Procedure: TRANSESOPHAGEAL ECHOCARDIOGRAM (TEE);  Surgeon: Laurey Morale, MD;  Location: [facility] ENDOSCOPY;  Service: Cardiovascular;  Laterality: N/A;   TEE WITHOUT CARDIOVERSION N/A 11/25/2015   Procedure: TRANSESOPHAGEAL ECHOCARDIOGRAM (TEE);  Surgeon: Alleen Borne, MD;  Location: [facility] OR;  Service: Open Heart Surgery;  Laterality: N/A;     Social History:   reports that he has never smoked. He has never used smokeless tobacco. He reports that he does not currently use alcohol. He reports that he does not currently use drugs after having used the following drugs: Marijuana.   Family History:  His family history includes Diabetes in his father; Hypertension in his brother, brother, brother, father, mother, and sister. There is no history of Colon cancer, Esophageal cancer, Rectal cancer, Stomach cancer, or Colon polyps.   Allergies Allergies  Allergen Reactions   Other Anaphylaxis    Mushrooms  Not listed on MAR    Plavix [Clopidogrel Bisulfate] Other (See Comments)    TTP Not listed on the [facility]   Fleet Enema [Enema] Other (See Comments)    Unknown reaction    Lovenox [Enoxaparin] Other (See Comments)    Unknown reaction   Morphine Other (See Comments)    Unknown reaction   Nsaids Other (See Comments)    Unknown reaction   Hydrocodone-Acetaminophen Itching     Home Medications  Prior to Admission medications   Medication Sig Start Date End Date Taking? Authorizing  Provider  amLODipine (NORVASC) 10 MG tablet Take 1 tablet (10 mg total) by mouth daily. 04/20/23  Yes McClung, Angela M, PA-C  atorvastatin (LIPITOR) 80 MG tablet TAKE 1 TABLET BY MOUTH ONCE DAILY AT 6 IN THE EVENING Patient taking differently: Take 80 mg by mouth at bedtime. 11/11/22  Yes Marcine Matar, MD  carvedilol (COREG) 6.25 MG tablet Take 1 tablet (6.25 mg total) by mouth 2 (two) times daily. 04/20/23  Yes Georgian Co M, PA-C  dapagliflozin propanediol (FARXIGA) 10 MG TABS tablet Take 1 tablet (10 mg total) by mouth daily. 04/20/23  Yes Marcine Matar, MD  ezetimibe (ZETIA) 10 MG tablet Take 1 tablet (10 mg total) by mouth daily. 04/20/23  Yes McClung, Angela M, PA-C  fluticasone (FLONASE) 50 MCG/ACT nasal spray Place 1 spray into both nostrils daily. 04/20/23  Yes Georgian Co M, PA-C  furosemide (LASIX) 40 MG tablet Per dialysis: 1 to 2 tabs daily depending on swelling(updated dosing) Patient taking differently: Take 160 mg by mouth daily. 2 tabs in AM and 2 tabs in PM 04/20/23  Yes McClung, Angela M, PA-C  lisinopril (ZESTRIL) 40 MG tablet Take 1 tablet (40 mg total) by mouth daily. 04/20/23  Yes McClung, Angela M, PA-C  potassium chloride SA (KLOR-CON M) 20 MEQ tablet TAKE 1 TABLET BY MOUTH ON MONDAY, Wed, FRIDAY and Saturdays. Patient taking differently: Take 20 mEq by mouth See admin instructions. TAKE 1 TABLET BY MOUTH ON MONDAY, Wed, FRIDAY and Saturdays. 04/20/23  Yes Anders Simmonds, PA-C     Critical care time: 45 minutes     JD Anselm Lis [facility] 05/18/2023, 7:17 PM  Please see [email or web address] for pager details.  From 7A-7P if no  response, please call [phone]. After hours, please call ELink [phone].

## 2023-05-18 NOTE — Progress Notes (Signed)
Admit: 05/17/2023 LOS: 0  24M recently recovered GFR from dialysis dependent AKI returns ot ED with symptomatic anemia and volume overload with DOE/Orthopnea.   Subjective:  0.75L UOP reported Heme seeing, getting another 1u PRBC today  BPs ok, on lasix 120 IV QID, I inc to 160 q6h today K 5.1 TTE completed not yet resulted Maybe LEE somewhat less than before  09/10 0701 - 09/11 0700 In: 929.4 [Blood:340; IV Piggyback:469.4] Out: 750 [Urine:750]  Filed Weights   05/17/23 2025 05/17/23 2100  Weight: 89.5 kg 89.5 kg    Scheduled Meds:  sodium chloride   Intravenous Once   acetaminophen  650 mg Oral Once   atorvastatin  80 mg Oral q1800   carvedilol  6.25 mg Oral BID   Chlorhexidine Gluconate Cloth  6 each Topical Daily   ezetimibe  10 mg Oral Daily   insulin aspart  0-9 Units Subcutaneous TID WC   Continuous Infusions:  furosemide     PRN Meds:.acetaminophen **OR** acetaminophen, ondansetron **OR** ondansetron (ZOFRAN) IV, mouth rinse  Current Labs: reviewed  Physical Exam:  Blood pressure (!) 104/56, pulse 72, temperature (!) 97.2 F (36.2 C), temperature source Oral, resp. rate 20, height 5\' 6"  (1.676 m), weight 89.5 kg, SpO2 96%. Sitting at edge of bed, not labored 2+ LEE b/l, symmetrical Regular, no rub Diminished in bases S/nt/nd TDC bandaged Nonfocal  A Recent dialysis dependent AKI, RRT stopped 9/3, now with at least CKD4 AoC HFrEF and hx/o TV IE from MSSA; volume overloaded with DOE and Orthopnea Recurrent/resistant symptomatic anemia, followed by hematology CAD with hx/o CAVG Hx/o MSSA TV IE DM2 HTN  P Inc lasix to 160 q6h, add metolazone if diuresis remains ineffective  Await TTE ESA per heme Check Mg Will follow closely Daily weights, Daily Renal Panel, Strict I/Os, Avoid nephrotoxins (NSAIDs, judicious IV Contrast)   [name], MD  05/18/2023, 3:29 PM  Recent Labs  Lab 05/17/23 0950 05/17/23 1021 05/18/23 0411  NA 137 137 138  K  5.8* 5.1 5.1  CL 108 107 107  CO2 21* 19* 16*  GLUCOSE 110* 115* 114*  BUN 53* 56* 61*  CREATININE 2.71* 2.70* 2.64*  CALCIUM 9.0 8.8* 9.0  PHOS  --   --  5.5*   Recent Labs  Lab 05/17/23 0852 05/17/23 1021 05/17/23 2116 05/18/23 0411  WBC 11.6* 12.0*  --  12.1*  NEUTROABS 4.4  --   --   --   HGB 6.4* 6.0* 7.6* 7.6*  HCT 20.0* 19.7* 24.2* 23.8*  MCV 93.9 96.6  --  97.1  PLT 109* 128*  --  110*

## 2023-05-18 NOTE — Evaluation (Signed)
Physical Therapy Evaluation Patient Details Name: [name] MRN: [id number] DOB: 1961-06-11 Today's Date: 05/18/2023  History of Present Illness  62 yo male presents to therapy following hospital admission on 05/17/2023 due to progressive weakness,B LE edema, SOB and angina. Pt recently stopped dialysis and symptoms presented since that time. Pt was found to be anemic requiring I unit PRBC. Pt was hospitalized in June of this year with a nosebleed and presented to ED from a SNF 6/21. 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  Clinical Impression      Pt admitted with above diagnosis.  Pt currently with functional limitations due to the deficits listed below (see PT Problem List). Pt seated in recliner when therapist arrived. Pt agreeable to therapy session and 97% on 3 L/min at rest. Pt required S for safety and IV and [place] cord management throughout eval, SPC for transfers and gait tasks. Pt able to amb with S and assist for IV and [place] cord management  to and from the bathroom 18 feet x 2. Pt reported SOB and required seated therapeutic rest break while in the bathroom and O2 saturation 91% on 3 L/min. Pt left seated in recliner all needs in place and pt remains on 3 L/min with O2 saturation >92%.  No reports of pain or dizziness. Pt will benefit from acute skilled PT to increase their independence and safety with mobility to allow discharge.       If plan is discharge home, recommend the following: A little help with walking and/or transfers;A little help with bathing/dressing/bathroom;Assistance with cooking/housework;Assist for transportation;Help with stairs or ramp for entrance   Can travel by private vehicle        Equipment Recommendations None recommended by PT  Recommendations for Other Services        Functional Status Assessment Patient has had a recent decline in their functional status and demonstrates the ability to make significant improvements in function in a reasonable and predictable amount of time.     Precautions / Restrictions Precautions Precautions: Fall (monitor O2) Restrictions Weight Bearing Restrictions: No      Mobility  Bed Mobility               General bed mobility comments: pt seated in recliner when PT arrived    Transfers Overall transfer level: Needs assistance Equipment used: Straight cane Transfers: Sit to/from Stand Sit to Stand: Supervision           General transfer comment: cues for safety and IV and O2 cord management    Ambulation/Gait Ambulation/Gait assistance: Supervision Gait Distance (Feet): 18 Feet Assistive device: Straight cane Gait Pattern/deviations: Step-to pattern, Trunk flexed Gait velocity: decreased     General Gait Details: cues for safety,  tube cord management and IV pole management. pt sates he has been getting up and taking himself to the bathroom. noted O2 cord extension. pt reported SOB wtih gait tasks and 91% on 3 L/min with exertion requiring ~ 1:30 min seated theraputic rest break to recover to return to recliner. pt required cues for pursed lip breathing  Stairs            Wheelchair Mobility     Tilt Bed    Modified Rankin (Stroke Patients Only)       Balance Overall balance assessment:  Mild deficits observed, not formally tested                                           Pertinent Vitals/Pain Pain Assessment Pain Assessment: No/denies pain    Home Living Family/patient expects to be discharged to:: Private residence Living Arrangements: Spouse/significant other Available Help at Discharge: Family;Available PRN/intermittently Type of Home: House Home Access: Ramped entrance       Home Layout: One level Home Equipment: Agricultural consultant (2 wheels);Cane -  single point      Prior Function Prior Level of Function : Needs assist               ADLs Comments: patient lives at home with "old lady" who helps him PRN for ADLs. patient reported using RW or cane to get around depending on how he is feeling.     Extremity/Trunk Assessment        Lower Extremity Assessment Lower Extremity Assessment: Overall WFL for tasks assessed    Cervical / Trunk Assessment Cervical / Trunk Assessment:  (wfl)  Communication   Communication Communication: No apparent difficulties  Cognition Arousal: Alert Behavior During Therapy: Flat affect, WFL for tasks assessed/performed Overall Cognitive Status: Within Functional Limits for tasks assessed                                 General Comments: patient knows date. patient seemed to have poor insight on IV pole and o2 cord managemnet.        General Comments General comments (skin integrity, edema, etc.): B LE edema    Exercises     Assessment/Plan    PT Assessment Patient needs continued PT services  PT Problem List Decreased activity tolerance;Decreased balance;Decreased mobility;Decreased coordination;Cardiopulmonary status limiting activity       PT Treatment Interventions DME instruction;Gait training;Functional mobility training;Therapeutic activities;Therapeutic exercise;Balance training;Neuromuscular re-education;Patient/family education    PT Goals (Current goals can be found in the Care Plan section)  Acute Rehab PT Goals Patient Stated Goal: to get stronger and go home without O2 PT Goal Formulation: With patient Time For Goal Achievement: 06/01/23 Potential to Achieve Goals: Good    Frequency Min 1X/week     Co-evaluation PT/OT/SLP Co-Evaluation/Treatment: Yes Reason for Co-Treatment: Complexity of the patient's impairments (multi-system involvement);To address functional/ADL transfers PT goals addressed during session: Mobility/safety with  mobility;Balance;Proper use of DME OT goals addressed during session: ADL's and self-care;Proper use of Adaptive equipment and DME       AM-PAC PT "6 Clicks" Mobility  Outcome Measure Help needed turning from your back to your side while in a flat bed without using bedrails?: A Little Help needed moving from lying on your back to sitting on the side of a flat bed without using bedrails?: A Little Help needed moving to and from a bed to a chair (including a wheelchair)?: A Little Help needed standing up from a chair using your arms (e.g., wheelchair or bedside chair)?: A Little Help needed to walk in hospital room?: A Little Help needed climbing 3-5 steps with a railing? : Total 6 Click Score: 16    End of Session Equipment Utilized During Treatment: Gait belt;Oxygen Activity Tolerance: Patient limited by fatigue Patient left: in chair;with call bell/phone within reach;with chair alarm set Nurse Communication: Mobility status PT Visit Diagnosis: Unsteadiness on  feet (R26.81);Difficulty in walking, not elsewhere classified (R26.2)    Time: 4098-1191 PT Time Calculation (min) (ACUTE ONLY): 15 min   Charges:   PT Evaluation $PT Eval Low Complexity: 1 Low   PT General Charges $$ ACUTE PT VISIT: 1 Visit         Johnny Bridge, PT Acute Rehab   Jacqualyn Posey 05/18/2023, 12:52 PM

## 2023-05-18 NOTE — Procedures (Signed)
Moderate Conscious Sedation Note    [name]  [id number]  [birth year]   Date:05/18/23  Time:6:49 PM   Provider Performing:Taiyana Kissler   Procedure: Moderate Conscious Sedation  Procedure: TEE for endocarditis  NPO yes Mallampati 3 Normal neck movement and good oral opening.     During this procedure the patient is administered a total of Versed 3 mg and Fentanyl 50 mcg and Ketamine 30mg  to achieve and maintain moderate conscious sedation. The patient's heart rate, blood pressure, and oxygen saturation are monitored continuously during the procedure. The period of conscious sedation is 10 minutes, of which I was present face-to-face 100% of this time.  Patient awake and conversant within of end of procedure.  Lynnell Catalan, MD [facility] ICU Physician [facility]  Pager: [phone] Or Epic Secure Chat After hours: [phone].  05/18/2023, 6:53 PM

## 2023-05-18 NOTE — Progress Notes (Signed)
Pharmacy Antibiotic Note  [name] is a 62 y.o. male admitted on 05/17/2023 with bioprosthetic aortic valve dehiscence - possible endocarditis - s/p MSSA TV endocarditis 01/2023. Afebrile, wbc 12 esrd starting CRRT tonight. Pharmacy has been consulted for vancomycin and ceftriaxone dosing.  Plan: Vancomycin 2gm x1 then 1gm IV q24h Ceftriaxone 2gm IV q24h  Height: 5\' 6"  (167.6 cm) Weight: 89.5 kg (197 lb 5 oz) IBW/kg (Calculated) : 63.8  Temp (24hrs), Avg:97.3 F (36.3 C), Min:97.2 F (36.2 C), Max:97.5 F (36.4 C)  Recent Labs  Lab 05/17/23 0852 05/17/23 0950 05/17/23 1021 05/18/23 0411  WBC 11.6*  --  12.0* 12.1*  CREATININE  --  2.71* 2.70* 2.64*    Estimated Creatinine Clearance: 30 mL/min (A) (by C-G formula based on SCr of 2.64 mg/dL (H)).    Allergies  Allergen Reactions   Other Anaphylaxis    Mushrooms  Not listed on MAR    Plavix [Clopidogrel Bisulfate] Other (See Comments)    TTP Not listed on the [facility]   Fleet Enema [Enema] Other (See Comments)    Unknown reaction   Lovenox [Enoxaparin] Other (See Comments)    Unknown reaction   Morphine Other (See Comments)    Unknown reaction   Nsaids Other (See Comments)    Unknown reaction   Hydrocodone-Acetaminophen Itching    Antimicrobials this admission:   Dose adjustments this admission:   Microbiology results: 9/11 BCx: drawn    Leota Sauers Pharm.D. CPP, BCPS Clinical Pharmacist [phone] 05/18/2023 8:45 PM

## 2023-05-18 NOTE — Plan of Care (Signed)
  Problem: Activity: Goal: Ability to tolerate increased activity will improve Outcome: Progressing   Problem: Respiratory: Goal: Ability to maintain a clear airway and adequate ventilation will improve Outcome: Progressing   Problem: Coping: Goal: Ability to adjust to condition or change in health will improve Outcome: Progressing   Problem: Fluid Volume: Goal: Ability to maintain a balanced intake and output will improve Outcome: Progressing   Problem: Tissue Perfusion: Goal: Adequacy of tissue perfusion will improve Outcome: Progressing

## 2023-05-18 NOTE — Progress Notes (Signed)
PROGRESS NOTE  [name] ZHY:[id number] DOB: 07/11/1961   PCP: Marcine Matar, MD  Patient is from: Home.  Lives with significant other.  Independently ambulates at baseline.  DOA: 05/17/2023 LOS: 0  Chief complaints Chief Complaint  Patient presents with   Chest Pain     Brief Narrative / Interim history: 62 year old M with PMH of cardiac arrest, CAD/CABG, CVA, DM-2, MSSA bacteremia/TV endocarditis/septic CVA, OSA and postcardiac arrest AKI/ATN for which he started outpatient HD in June that was discontinued on 9/3 after evidence of GFR recovery, sent to ED from oncology office due to chest pain, dyspnea and bilateral lower extremity edema.  He was admitted with volume overload/acute on chronic diastolic CHF and anemia.  His BNP was elevated to 2000.  Chest x-ray positive for CHF.  His hemoglobin was 6.0.  He was transfused 1 unit with appropriate response.  Started on IV Lasix drip by nephrology.  Oncology and nephrology following.   Subjective: Seen and examined earlier this morning.  No major events overnight of this morning.  Continues to endorse dyspnea, DOE, edema and productive cough with whitish phlegm.  Denies dizziness.  Denies GI or UTI symptoms.  Objective: Vitals:   05/18/23 0027 05/18/23 0424 05/18/23 1122 05/18/23 1213  BP: 108/66 (!) 116/97 (!) 114/94 106/62  Pulse: 76 84  77  Resp:  (!) 22  (!) 22  Temp:  (!) 97.5 F (36.4 C)    TempSrc:  Oral    SpO2: 91% 90%  100%  Weight:      Height:        Examination:  GENERAL: No apparent distress.  Nontoxic. HEENT: MMM.  Vision and hearing grossly intact.  NECK: Supple.  No apparent JVD.  RESP:  No IWOB.  Fair aeration bilaterally.  Crackles in left lung. CVS:  RRR. Heart sounds normal.  ABD/GI/GU: BS+. Abd soft, NTND.  MSK/EXT:  Moves extremities. No apparent deformity.  2+ edema. SKIN: no apparent skin lesion or wound NEURO: Awake, alert and oriented appropriately.  No apparent focal neuro  deficit. PSYCH: Calm. Normal affect.   Procedures:  None  Microbiology summarized: None  Assessment and plan: Principal Problem:   Volume overload Active Problems:   Class 1 obesity   GERD (gastroesophageal reflux disease)   Essential hypertension   CAD (coronary artery disease)   Type 2 diabetes mellitus without complication, without long-term current use of insulin (HCC)   OSA on CPAP   Anemia   Acute on chronic congestive heart failure (HCC)  Acute on chronic combined CHF/volume overload: TTE in 01/2023 with LVEF of 45 to 50% and indeterminate DD.  Likely due to renal failure and anemia.  He presents with dyspnea, DOE and chest pressure.  BNP elevated.  Chest x-ray consistent with CHF.  He was HD dependent until 9/3.  Nephrology consulted and started on IV Lasix drip -On IV Lasix drip per nephrology. -Continue Coreg.  Stopped the pain given edema. -Strict intake and output, daily weight, renal functions and electrolytes -Follow echocardiogram -Wean off oxygen.  IS, OOB/PT/OT -Sodium and fluid restriction  CKD-4 with volume overload:  Patient was on HD due to AKI/ATN after cardiac arrest that was discontinued after GFR recovery on 9/3.  Still has HD cath. Recent Labs    02/25/23 2345 02/26/23 0825 02/27/23 0234 02/28/23 0141 03/12/23 1344 03/25/23 1303 04/06/23 1024 05/17/23 0950 05/17/23 1021 05/18/23 0411  BUN 17 18  18 13 22 19 19 16  53* 56* 61*  CREATININE 5.15*  5.13*  5.07* 4.12* 5.31* 3.03* 2.48* 1.98* 2.71* 2.70* 2.64*  -IV Lasix per nephrology  Symptomatic anemia of renal disease: Baseline Hgb about 7.  No report of overt bleeding. Recent Labs    03/12/23 1344 03/25/23 1303 04/06/23 1024 04/11/23 0915 04/22/23 1047 05/06/23 0746 05/17/23 0852 05/17/23 1021 05/17/23 2116 05/18/23 0411  HGB 6.4* 6.0* 6.0* 6.8* 7.1* 7.8* 6.4* 6.0* 7.6* 7.6*  -Transfused 1 unit on admission.  Agree with additional 1 unit as ordered by hematology -Defer IV iron and  Aranesp to hematology and nephrology.  DM-2 with renal failure: A1c 6.2% on 6/22.  Seems to be on Farxiga Recent Labs  Lab 05/17/23 2027 05/18/23 0732 05/18/23 1117  GLUCAP 122* 107* 114*  -Continue current insulin regimen  History of CAD/CABG: Stable.  Chest pain likely due to CHF. -Continue Coreg, Lipitor, Zetia  Essential hypertension -Continue home Coreg.  Discontinue amlodipine given edema.  OSA: On CPAP? -Will clarify.  GERD -Continue PPI  Thrombocytopenia: -Continue monitoring  Morbid obesity: Elevated BMI with comorbidity as above. Body mass index is 31.85 kg/m.           DVT prophylaxis:  SCDs Start: 05/17/23 1446  Code Status: Full code Family Communication: None at bedside Level of care: Progressive Status is: Observation The patient will require care spanning > 2 midnights and should be moved to inpatient because: Acute on chronic CHF, volume overload and anemia   Final disposition: Likely home Consultants:  Nephrology Oncology   55 minutes with more than 50% spent in reviewing records, counseling patient/family and coordinating care.   Sch Meds:  Scheduled Meds:  sodium chloride   Intravenous Once   sodium chloride   Intravenous Once   acetaminophen  650 mg Oral Once   amLODipine  10 mg Oral Daily   atorvastatin  80 mg Oral q1800   carvedilol  6.25 mg Oral BID   Chlorhexidine Gluconate Cloth  6 each Topical Daily   ezetimibe  10 mg Oral Daily   insulin aspart  0-9 Units Subcutaneous TID WC   Continuous Infusions:  furosemide     PRN Meds:.acetaminophen **OR** acetaminophen, ondansetron **OR** ondansetron (ZOFRAN) IV, mouth rinse  Antimicrobials: Anti-infectives (From admission, onward)    None        I have personally reviewed the following labs and images: CBC: Recent Labs  Lab 05/17/23 0852 05/17/23 1021 05/17/23 2116 05/18/23 0411  WBC 11.6* 12.0*  --  12.1*  NEUTROABS 4.4  --   --   --   HGB 6.4* 6.0* 7.6* 7.6*   HCT 20.0* 19.7* 24.2* 23.8*  MCV 93.9 96.6  --  97.1  PLT 109* 128*  --  110*   BMP &GFR Recent Labs  Lab 05/17/23 0950 05/17/23 1021 05/18/23 0411  NA 137 137 138  K 5.8* 5.1 5.1  CL 108 107 107  CO2 21* 19* 16*  GLUCOSE 110* 115* 114*  BUN 53* 56* 61*  CREATININE 2.71* 2.70* 2.64*  CALCIUM 9.0 8.8* 9.0  PHOS  --   --  5.5*   Estimated Creatinine Clearance: 30 mL/min (A) (by C-G formula based on SCr of 2.64 mg/dL (H)). Liver & Pancreas: Recent Labs  Lab 05/17/23 0950 05/18/23 0411  AST 14* 13*  ALT <5 8  ALKPHOS 98 97  BILITOT 1.5* 2.0*  PROT 8.2* 8.5*  ALBUMIN 3.5 3.5   No results for input(s): "LIPASE", "AMYLASE" in the last 168 hours. No results for input(s): "AMMONIA" in the last 168  hours. Diabetic: No results for input(s): "HGBA1C" in the last 72 hours. Recent Labs  Lab 05/17/23 2027 05/18/23 0732 05/18/23 1117  GLUCAP 122* 107* 114*   Cardiac Enzymes: No results for input(s): "CKTOTAL", "CKMB", "CKMBINDEX", "TROPONINI" in the last 168 hours. No results for input(s): "PROBNP" in the last 8760 hours. Coagulation Profile: No results for input(s): "INR", "PROTIME" in the last 168 hours. Thyroid Function Tests: No results for input(s): "TSH", "T4TOTAL", "FREET4", "T3FREE", "THYROIDAB" in the last 72 hours. Lipid Profile: No results for input(s): "CHOL", "HDL", "LDLCALC", "TRIG", "CHOLHDL", "LDLDIRECT" in the last 72 hours. Anemia Panel: Recent Labs    05/17/23 2116  FERRITIN 2,200*  TIBC 204*  IRON 140   Urine analysis:    Component Value Date/Time   COLORURINE AMBER (A) 01/29/2023 0745   APPEARANCEUR TURBID (A) 01/29/2023 0745   LABSPEC 1.031 (H) 01/29/2023 0745   PHURINE 5.0 01/29/2023 0745   GLUCOSEU NEGATIVE 01/29/2023 0745   HGBUR MODERATE (A) 01/29/2023 0745   HGBUR negative 04/09/2010 0814   BILIRUBINUR SMALL (A) 01/29/2023 0745   KETONESUR NEGATIVE 01/29/2023 0745   PROTEINUR >=300 (A) 01/29/2023 0745   UROBILINOGEN 0.2 02/24/2015  1601   NITRITE NEGATIVE 01/29/2023 0745   LEUKOCYTESUR NEGATIVE 01/29/2023 0745   Sepsis Labs: Invalid input(s): "PROCALCITONIN", "LACTICIDVEN"  Microbiology: No results found for this or any previous visit (from the past 240 hour(s)).  Radiology Studies: No results found.    [name] T. Candence Sease [facility]  If 7PM-7AM, please contact night-coverage [email or web address] 05/18/2023, 12:15 PM home.

## 2023-05-18 NOTE — Care Management Obs Status (Signed)
MEDICARE OBSERVATION STATUS NOTIFICATION   Patient Details  Name: [name] MRN: [id number] Date of Birth: 1961/06/29   Medicare Observation Status Notification Given:       Howell Rucks, RN 05/18/2023, 12:21 PM

## 2023-05-18 NOTE — Progress Notes (Signed)
[name]   DOB:11-Nov-1960   AO#:[id number]    ASSESSMENT & PLAN:   Deficiency anemia The patient has multifactorial anemia, combination of bone marrow suppression from recent severe endocarditis and antibiotics and anemia chronic renal failure Due to his history of heart disease, he would need blood transfusion to keep his hemoglobin above 8   We discussed some of the risks, benefits, and alternatives of blood transfusions. The patient is symptomatic from anemia and the hemoglobin level is critically low.  Some of the side-effects to be expected including risks of transfusion reactions, chills, infection, syndrome of volume overload and risk of hospitalization from various reasons and the patient is willing to proceed and went ahead to sign consent today. I recommend 1 unit of blood today and recheck tomorrow His outpatient follow-up tomorrow is canceled and rescheduled He will return in about 1 week for further follow-up in my clinic   ESRD (end stage renal disease) on dialysis [facility]) According to the patient, he is doing well and he has signs of kidney function recovery He stated that his nephrologist had told him that his last dialysis session was on September 3 Previously, I have discussed the case with his nephrologist who will be arranging for ESA and IV iron in the dialysis unit; with discontinuation of hemodialysis, I will resume ESA in my outpatient clinic.  His iron studies show adequate iron, likely from repeat transfusion support.  I will order vitamin B12 tomorrow to complete the assessment I will start him on ESA hopefully prior to discharge   H/O heart bypass surgery He had coronary artery disease and history of bypass surgery He has signs of congestive heart failure and fluid overload We will keep transfusion threshold above 8 I will prescribe IV Lasix after transfusion support  Fluid overload Chest x-ray and BNP showed evidence of congestive heart failure and the patient  had shortness of breath and cough on minimal exertion.  Will continue diuresis.  Echocardiogram is ordered and pending  Poor IV access We were not able to give him transfusion yesterday in the outpatient clinic due to poor venous access.  I will order port placement on the left side of his chest, hopefully to be done prior to discharge  Discharge planning He is not ready to be discharged today   All questions were answered. The patient knows to call the clinic with any problems, questions or concerns.   The total time spent in the appointment was 55 minutes encounter with patients including review of chart and various tests results, discussions about plan of care and coordination of care plan  Artis Delay, MD 05/18/2023 8:56 AM  Subjective:  Patient is well-known to me.  He is supposed to return tomorrow for blood transfusion support.  The patient show up in the outpatient clinic crying, asking for help.  He has not been feeling well and has significant shortness of breath and cough over the past few days.  He was seen at the dialysis center on Monday and was told he has anemia and was told he needed to seek ED evaluation but the patient did not have transportation until yesterday morning. Ultimately, the patient was assessed at the symptom management clinic and has signs of congestive heart failure.  He also have no IV access to allow for blood product administration and ultimately was sent to the emergency department for evaluation.  The patient was admitted.  He received 1 unit of blood. He still have persistent shortness of breath  and cough on minimal exertion.  Objective:  Vitals:   05/18/23 0027 05/18/23 0424  BP: 108/66 (!) 116/97  Pulse: 76 84  Resp:  (!) 22  Temp:  (!) 97.5 F (36.4 C)  SpO2: 91% 90%     Intake/Output Summary (Last 24 hours) at 05/18/2023 0856 Last data filed at 05/18/2023 0700 Gross per 24 hour  Intake 929.38 ml  Output 750 ml  Net 179.38 ml     GENERAL:alert, no distress and comfortable    Labs:  Recent Labs    04/06/23 1024 05/17/23 0950 05/17/23 1021 05/18/23 0411  NA 135 137 137 138  K 4.1 5.8* 5.1 5.1  CL 98 108 107 107  CO2 26 21* 19* 16*  GLUCOSE 125* 110* 115* 114*  BUN 16 53* 56* 61*  CREATININE 1.98* 2.71* 2.70* 2.64*  CALCIUM 8.7* 9.0 8.8* 9.0  GFRNONAA 37* 26* 26* 26*  PROT 7.7 8.2*  --  8.5*  ALBUMIN 2.6* 3.5  --  3.5  AST 13* 14*  --  13*  ALT 8 <5  --  8  ALKPHOS 75 98  --  97  BILITOT 0.7 1.5*  --  2.0*    Studies: I have reviewed his chest x-ray DG Chest Port 1 View  Result Date: 05/17/2023 CLINICAL DATA:  SHORTNESS OF BREATH. CHEST PAIN. LOWER EXTREMITY SWELLING. EXAM: PORTABLE CHEST 1 VIEW COMPARISON:  05/16/2023 FINDINGS: There is a right chest wall dialysis catheter with tips in the distal SVC. Postoperative changes from prior median sternotomy. Heart size appears stable. There is moderate diffuse pulmonary vascular congestion. No frank edema. No pleural fluid or consolidative change. IMPRESSION: Moderate pulmonary vascular congestion. Electronically Signed   By: Signa Kell M.D.   On: 05/17/2023 13:15

## 2023-05-18 NOTE — Consult Note (Signed)
Chief Complaint: Patient was seen in consultation today for port a cath placement Chief Complaint  Patient presents with   Chest Pain    Referring Physician(s): Gorsuch,N  Supervising Physician: Roanna Banning  Patient Status: [facility] - In-pt  History of Present Illness: [name] is a 62 y.o. male with past medical history significant for anxiety, coronary artery disease with prior MI/bypass, CHF, GERD, gout, chronic renal failure with existing HD catheter right chest wall- creat improving, hyperlipidemia, hypertension, prior stroke, diabetes and multifactorial anemia likely secondary to bone marrow suppression from recent severe endocarditis/ antibiotics and anemia of chronic renal failure.  Patient has poor venous access and hematology service was not able to give blood transfusion yesterday.  Request now received for Port-A-Cath placement to assist with future needed blood transfusions.  Past Medical History:  Diagnosis Date   Allergy    Anemia    Anxiety    Baker's cyst of knee    Blood transfusion without reported diagnosis    as baby    Coronary artery disease    quadruple bypass - March 2016   GERD (gastroesophageal reflux disease)    Gouty arthritis    "real bad" (01/17/2013)   Heart murmur    Hypercholesteremia    Hypertension    MSSA bacteremia 01/29/2023   Myocardial infarction (HCC) 2017   PEA (Pulseless electrical activity) (HCC) 01/29/2023   Stroke (HCC)    Type II diabetes mellitus (HCC)     Past Surgical History:  Procedure Laterality Date   AORTIC VALVE REPLACEMENT N/A 11/25/2015   Procedure: AORTIC VALVE REPLACEMENT (AVR) USING A MAGNA EASE ;  Surgeon: Alleen Borne, MD;  Location: [facility] OR;  Service: Open Heart Surgery;  Laterality: N/A;   CARDIAC CATHETERIZATION N/A 11/19/2015   Procedure: Right/Left Heart Cath and Coronary Angiography;  Surgeon: Lyn Records, MD;  Location: [facility] INVASIVE CV LAB;  Service: Cardiovascular;  Laterality: N/A;    COLONOSCOPY  2004   CORONARY ARTERY BYPASS GRAFT N/A 11/25/2015   Procedure: CORONARY ARTERY BYPASS GRAFTING (CABG)x4 LIMA-LAD; SVG-OM; SVG-RCA; SVG-DIAG;  Surgeon: Alleen Borne, MD;  Location: [facility] OR;  Service: Open Heart Surgery;  Laterality: N/A;   CYST REMOVAL TRUNK N/A 08/24/2016   Procedure: EXCISION OF BACK ABSCESS;  Surgeon: Jimmye Norman, MD;  Location: [facility];  Service: General;  Laterality: N/A;   FINGER AMPUTATION Right 1980's   3rd & 4th digits "got them mashed" (01/17/2013)   KNEE ARTHROSCOPY Right 1980's   MULTIPLE EXTRACTIONS WITH ALVEOLOPLASTY Bilateral 11/21/2015   Procedure: Extraction of tooth #'s 2,12 with alveoloplasty and gross debridement of remaining dentition;  Surgeon: Charlynne Pander, DDS;  Location: [facility] OR;  Service: Oral Surgery;  Laterality: Bilateral;   TEE WITHOUT CARDIOVERSION N/A 11/20/2015   Procedure: TRANSESOPHAGEAL ECHOCARDIOGRAM (TEE);  Surgeon: Laurey Morale, MD;  Location: [facility] ENDOSCOPY;  Service: Cardiovascular;  Laterality: N/A;   TEE WITHOUT CARDIOVERSION N/A 11/25/2015   Procedure: TRANSESOPHAGEAL ECHOCARDIOGRAM (TEE);  Surgeon: Alleen Borne, MD;  Location: [facility] OR;  Service: Open Heart Surgery;  Laterality: N/A;    Allergies: Other, Plavix [clopidogrel bisulfate], Fleet enema [enema], Lovenox [enoxaparin], Morphine, Nsaids, and Hydrocodone-acetaminophen  Medications: Prior to Admission medications   Medication Sig Start Date End Date Taking? Authorizing Provider  amLODipine (NORVASC) 10 MG tablet Take 1 tablet (10 mg total) by mouth daily. 04/20/23  Yes McClung, Angela M, PA-C  atorvastatin (LIPITOR) 80 MG tablet TAKE 1 TABLET BY MOUTH ONCE DAILY AT  6 IN THE EVENING Patient taking differently: Take 80 mg by mouth at bedtime. 11/11/22  Yes Marcine Matar, MD  carvedilol (COREG) 6.25 MG tablet Take 1 tablet (6.25 mg total) by mouth 2 (two) times daily. 04/20/23  Yes Georgian Co M, PA-C  dapagliflozin propanediol (FARXIGA) 10 MG  TABS tablet Take 1 tablet (10 mg total) by mouth daily. 04/20/23  Yes Marcine Matar, MD  ezetimibe (ZETIA) 10 MG tablet Take 1 tablet (10 mg total) by mouth daily. 04/20/23  Yes McClung, Angela M, PA-C  fluticasone (FLONASE) 50 MCG/ACT nasal spray Place 1 spray into both nostrils daily. 04/20/23  Yes Georgian Co M, PA-C  furosemide (LASIX) 40 MG tablet Per dialysis: 1 to 2 tabs daily depending on swelling(updated dosing) Patient taking differently: Take 160 mg by mouth daily. 2 tabs in AM and 2 tabs in PM 04/20/23  Yes McClung, Angela M, PA-C  lisinopril (ZESTRIL) 40 MG tablet Take 1 tablet (40 mg total) by mouth daily. 04/20/23  Yes McClung, Angela M, PA-C  potassium chloride SA (KLOR-CON M) 20 MEQ tablet TAKE 1 TABLET BY MOUTH ON MONDAY, Wed, FRIDAY and Saturdays. Patient taking differently: Take 20 mEq by mouth See admin instructions. TAKE 1 TABLET BY MOUTH ON MONDAY, Wed, FRIDAY and Saturdays. 04/20/23  Yes Anders Simmonds, PA-C     Family History  Problem Relation Age of Onset   Diabetes Father    Hypertension Father    Hypertension Mother    Hypertension Sister    Hypertension Brother    Hypertension Brother    Hypertension Brother    Colon cancer Neg Hx    Esophageal cancer Neg Hx    Rectal cancer Neg Hx    Stomach cancer Neg Hx    Colon polyps Neg Hx     Social History   Socioeconomic History   Marital status: Married    Spouse name: Not on file   Number of children: Not on file   Years of education: Not on file   Highest education level: Not on file  Occupational History   Not on file  Tobacco Use   Smoking status: Never   Smokeless tobacco: Never  Vaping Use   Vaping status: Never Used  Substance and Sexual Activity   Alcohol use: Not Currently    Comment: occasionally   Drug use: Not Currently    Types: Marijuana    Comment: pt states occasionally   Sexual activity: Yes  Other Topics Concern   Not on file  Social History Narrative   Not on file    Social Determinants of Health   Financial Resource Strain: Low Risk  (12/09/2022)   Overall Financial Resource Strain (CARDIA)    Difficulty of Paying Living Expenses: Not hard at all  Food Insecurity: No Food Insecurity (05/17/2023)   Hunger Vital Sign    Worried About Running Out of Food in the Last Year: Never true    Ran Out of Food in the Last Year: Never true  Transportation Needs: No Transportation Needs (05/17/2023)   PRAPARE - Administrator, Civil Service (Medical): No    Lack of Transportation (Non-Medical): No  Physical Activity: Inactive (12/09/2022)   Exercise Vital Sign    Days of Exercise per Week: 0 days    Minutes of Exercise per Session: 0 min  Stress: No Stress Concern Present (12/09/2022)   [employer] of Occupational Health - Occupational Stress Questionnaire    Feeling of Stress :  Not at all  Social Connections: Socially Isolated (11/11/2022)   Social Connection and Isolation Panel [NHANES]    Frequency of Communication with Friends and Family: Never    Frequency of Social Gatherings with Friends and Family: Never    Attends Religious Services: Never    Database administrator or Organizations: No    Attends Banker Meetings: Never    Marital Status: Widowed      Review of Systems denies fever, headache, chest pain, abdominal pain, nausea, vomiting or bleeding.  Does have some dyspnea, occasional cough, back pain  Vital Signs: BP (!) 114/94   Pulse 84   Temp (!) 97.5 F (36.4 C) (Oral)   Resp (!) 22   Ht 5\' 6"  (1.676 m)   Wt 197 lb 5 oz (89.5 kg)   SpO2 90%   BMI 31.85 kg/m     Physical Exam: Awake, alert.  Chest with few bibasilar rales.  Intact right chest wall HD catheter.  Heart with regular rate and rhythm.  Abdomen soft, positive bowel sounds, nontender.  Bilateral lower extremity edema.  Imaging: DG Chest Port 1 View  Result Date: 05/17/2023 CLINICAL DATA:  SHORTNESS OF BREATH. CHEST PAIN. LOWER EXTREMITY  SWELLING. EXAM: PORTABLE CHEST 1 VIEW COMPARISON:  05/16/2023 FINDINGS: There is a right chest wall dialysis catheter with tips in the distal SVC. Postoperative changes from prior median sternotomy. Heart size appears stable. There is moderate diffuse pulmonary vascular congestion. No frank edema. No pleural fluid or consolidative change. IMPRESSION: Moderate pulmonary vascular congestion. Electronically Signed   By: Signa Kell M.D.   On: 05/17/2023 13:15    Labs:  CBC: Recent Labs    05/06/23 0746 05/17/23 0852 05/17/23 1021 05/17/23 2116 05/18/23 0411  WBC 9.0 11.6* 12.0*  --  12.1*  HGB 7.8* 6.4* 6.0* 7.6* 7.6*  HCT 24.3* 20.0* 19.7* 24.2* 23.8*  PLT 115* 109* 128*  --  110*    COAGS: Recent Labs    01/28/23 2355 01/29/23 0200 03/25/23 1303  INR 2.3* 2.3* 1.5*  APTT 69* 71* 38*    BMP: Recent Labs    04/06/23 1024 05/17/23 0950 05/17/23 1021 05/18/23 0411  NA 135 137 137 138  K 4.1 5.8* 5.1 5.1  CL 98 108 107 107  CO2 26 21* 19* 16*  GLUCOSE 125* 110* 115* 114*  BUN 16 53* 56* 61*  CALCIUM 8.7* 9.0 8.8* 9.0  CREATININE 1.98* 2.71* 2.70* 2.64*  GFRNONAA 37* 26* 26* 26*    LIVER FUNCTION TESTS: Recent Labs    03/25/23 1303 04/06/23 1024 05/17/23 0950 05/18/23 0411  BILITOT 0.8 0.7 1.5* 2.0*  AST 18 13* 14* 13*  ALT 7 8 <5 8  ALKPHOS 68 75 98 97  PROT 8.3* 7.7 8.2* 8.5*  ALBUMIN 2.4* 2.6* 3.5 3.5    TUMOR MARKERS: No results for input(s): "AFPTM", "CEA", "CA199", "CHROMGRNA" in the last 8760 hours.  Assessment and Plan: 62 y.o. male with past medical history significant for anxiety, coronary artery disease with prior MI/bypass, CHF, GERD, gout, chronic renal failure with existing HD catheter right chest wall- creat improving, hyperlipidemia, hypertension, prior stroke, diabetes and multifactorial anemia likely secondary to bone marrow suppression from recent severe endocarditis/ antibiotics and anemia of chronic renal failure.  Patient has poor  venous access and hematology service was not able to give blood transfusion yesterday.  Request now received for Port-A-Cath placement to assist with future needed blood transfusions.Case reviewed by Dr. Milford Cage.  Risks  and benefits of image guided port-a-catheter placement was discussed with the patient including, but not limited to bleeding, infection, pneumothorax, or fibrin sheath development and need for additional procedures.  All of the patient's questions were answered, patient is agreeable to proceed. Consent signed and in chart.  Tentatively plan procedure for either today or tomorrow.  Patient afebrile, hgb 7.6, platelets 110k, creat 2.64  Thank you for this interesting consult.  I greatly enjoyed meeting Perris Amjad and look forward to participating in their care.  A copy of this report was sent to the requesting provider on this date.  Electronically Signed: D. Jeananne Rama, PA-C 05/18/2023, 11:31 AM   I spent a total of  25 minutes   in face to face in clinical consultation, greater than 50% of which was counseling/coordinating care for Port-A-Cath placement

## 2023-05-18 NOTE — Progress Notes (Signed)
Admitted patient from [facility] hospital to room 2H 15. Dr. Denese Killings and Dr. Izora Ribas and Dr. Laneta Simmers at bedside. Orders received for Midazolam/Fentanyl and Ketamine for Dr. Denese Killings to give. Please see note by Dr. Denese Killings.  Vitals done every 3 minutes for conscious sedation. TEE done under conscious sedation. Patient tolerated well.

## 2023-05-18 NOTE — Consult Note (Signed)
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 Aurelio Brash is an 62 y.o. male.  HPI:  The patient is a 62 year old gentleman with history of hypertension, hypercholesterolemia, type 2 diabetes, end-stage renal disease on hemodialysis until a couple weeks ago, coronary artery disease and bicuspid aortic valve with severe aortic insufficiency status post CABG x 4 and aortic valve replacement using a 23 mm Edwards magna-ease pericardial valve by me on 11/25/2015, MSSA tricuspid valve endocarditis in May 2024 treated at [facility] who presented yesterday to [facility] with lower extremity edema and orthopnea and was found to be anemic with a hemoglobin of 6.0.  The patient had been off dialysis for the past week following improvement in his kidney function.  Chest x-ray on presentation showed moderate pulmonary vascular congestion.  He has remained hemodynamically stable.  He underwent a 2D echocardiogram at [facility] earlier today which showed an ejection fraction of 50 to 55%.  It appeared that the prosthetic aortic valve had partially dehisced from the aortic annulus with significant paravalvular leak which was new compared to previous echo in May.  The aortic root appeared enlarged at 4.7 cm.  He was transferred to [facility] this evening and underwent a TEE which showed partial dehiscence of the aortic valve prosthesis with 2 perivalvular leaks adjacent to the left and right cusps.  There was no holodiastolic flow reversal.  It appeared the aortic insufficiency was moderate to severe.  There is also dilation of the aortic root to 50 mm.  The ascending aorta was measured at 4.3 cm.  Ejection fraction was 55 to 60%.  Right ventricular function appeared normal with severely elevated pulmonary  artery systolic pressure.  There is a 12 x 11 mm echodensity in the subvalvular area of the tricuspid valve annulus.  There was severe tricuspid regurgitation which appeared central.  The patient was treated at [facility] for MSSA tricuspid valve endocarditis in May and said that he had a cardiac arrest at that time.  He was treated with antibiotics and recovered.  He reported feeling well until the past week when he developed progressive lower extremity edema and shortness of breath.  He denied any fever or chills.  He has been eating well and ambulating.  Past Medical History:  Diagnosis Date   Allergy    Anemia    Anxiety    Baker's cyst of knee    Blood transfusion without reported diagnosis    as baby    Coronary artery disease    quadruple bypass - March 2016   GERD (gastroesophageal reflux disease)    Gouty arthritis    "real bad" (01/17/2013)   Heart murmur    Hypercholesteremia    Hypertension    MSSA bacteremia 01/29/2023   Myocardial infarction (HCC) 2017   PEA (Pulseless electrical activity) (HCC) 01/29/2023   Stroke (HCC)    Type II diabetes mellitus (HCC)     Past Surgical History:  Procedure Laterality Date   AORTIC VALVE REPLACEMENT N/A 11/25/2015   Procedure: AORTIC VALVE REPLACEMENT (AVR) USING A MAGNA EASE ;  Surgeon: Alleen Borne, MD;  Location: [facility] OR;  Service: Open Heart Surgery;  Laterality: N/A;   CARDIAC CATHETERIZATION N/A 11/19/2015  Procedure: Right/Left Heart Cath and Coronary Angiography;  Surgeon: Lyn Records, MD;  Location: [facility] INVASIVE CV LAB;  Service: Cardiovascular;  Laterality: N/A;   COLONOSCOPY  2004   CORONARY ARTERY BYPASS GRAFT N/A 11/25/2015   Procedure: CORONARY ARTERY BYPASS GRAFTING (CABG)x4 LIMA-LAD; SVG-OM; SVG-RCA; SVG-DIAG;  Surgeon: Alleen Borne, MD;  Location: [facility] OR;  Service: Open Heart Surgery;  Laterality: N/A;   CYST REMOVAL TRUNK N/A 08/24/2016   Procedure: EXCISION OF BACK ABSCESS;  Surgeon: Jimmye Norman, MD;  Location:  [facility];  Service: General;  Laterality: N/A;   FINGER AMPUTATION Right 1980's   3rd & 4th digits "got them mashed" (01/17/2013)   KNEE ARTHROSCOPY Right 1980's   MULTIPLE EXTRACTIONS WITH ALVEOLOPLASTY Bilateral 11/21/2015   Procedure: Extraction of tooth #'s 2,12 with alveoloplasty and gross debridement of remaining dentition;  Surgeon: Charlynne Pander, DDS;  Location: [facility] OR;  Service: Oral Surgery;  Laterality: Bilateral;   TEE WITHOUT CARDIOVERSION N/A 11/20/2015   Procedure: TRANSESOPHAGEAL ECHOCARDIOGRAM (TEE);  Surgeon: Laurey Morale, MD;  Location: [facility] ENDOSCOPY;  Service: Cardiovascular;  Laterality: N/A;   TEE WITHOUT CARDIOVERSION N/A 11/25/2015   Procedure: TRANSESOPHAGEAL ECHOCARDIOGRAM (TEE);  Surgeon: Alleen Borne, MD;  Location: [facility] OR;  Service: Open Heart Surgery;  Laterality: N/A;    Family History  Problem Relation Age of Onset   Diabetes Father    Hypertension Father    Hypertension Mother    Hypertension Sister    Hypertension Brother    Hypertension Brother    Hypertension Brother    Colon cancer Neg Hx    Esophageal cancer Neg Hx    Rectal cancer Neg Hx    Stomach cancer Neg Hx    Colon polyps Neg Hx     Social History:  reports that he has never smoked. He has never used smokeless tobacco. He reports that he does not currently use alcohol. He reports that he does not currently use drugs after having used the following drugs: Marijuana.  Allergies:  Allergies  Allergen Reactions   Other Anaphylaxis    Mushrooms  Not listed on MAR    Plavix [Clopidogrel Bisulfate] Other (See Comments)    TTP Not listed on the [facility]   Fleet Enema [Enema] Other (See Comments)    Unknown reaction   Lovenox [Enoxaparin] Other (See Comments)    Unknown reaction   Morphine Other (See Comments)    Unknown reaction   Nsaids Other (See Comments)    Unknown reaction   Hydrocodone-Acetaminophen Itching    Medications: I have reviewed the patient's current  medications. Prior to Admission:  Medications Prior to Admission  Medication Sig Dispense Refill Last Dose   amLODipine (NORVASC) 10 MG tablet Take 1 tablet (10 mg total) by mouth daily. 90 tablet 0 05/17/2023   atorvastatin (LIPITOR) 80 MG tablet TAKE 1 TABLET BY MOUTH ONCE DAILY AT 6 IN THE EVENING (Patient taking differently: Take 80 mg by mouth at bedtime.) 90 tablet 3 05/17/2023   carvedilol (COREG) 6.25 MG tablet Take 1 tablet (6.25 mg total) by mouth 2 (two) times daily. 180 tablet 2 05/17/2023   dapagliflozin propanediol (FARXIGA) 10 MG TABS tablet Take 1 tablet (10 mg total) by mouth daily. 90 tablet 1 05/17/2023   ezetimibe (ZETIA) 10 MG tablet Take 1 tablet (10 mg total) by mouth daily. 90 tablet 3 05/17/2023   fluticasone (FLONASE) 50 MCG/ACT nasal spray Place 1 spray into both nostrils daily. 48 g 3 05/16/2023  furosemide (LASIX) 40 MG tablet Per dialysis: 1 to 2 tabs daily depending on swelling(updated dosing) (Patient taking differently: Take 160 mg by mouth daily. 2 tabs in AM and 2 tabs in PM) 180 tablet 1 05/17/2023   lisinopril (ZESTRIL) 40 MG tablet Take 1 tablet (40 mg total) by mouth daily. 90 tablet 1 05/17/2023   potassium chloride SA (KLOR-CON M) 20 MEQ tablet TAKE 1 TABLET BY MOUTH ON MONDAY, Wed, FRIDAY and Saturdays. (Patient taking differently: Take 20 mEq by mouth See admin instructions. TAKE 1 TABLET BY MOUTH ON MONDAY, Wed, FRIDAY and Saturdays.) 48 tablet 2 05/16/2023   Scheduled:  sodium chloride   Intravenous Once   acetaminophen  650 mg Oral Once   atorvastatin  80 mg Oral q1800   carvedilol  6.25 mg Oral BID   Chlorhexidine Gluconate Cloth  6 each Topical Daily   ezetimibe  10 mg Oral Daily   insulin aspart  0-6 Units Subcutaneous Q4H   metolazone  5 mg Oral Daily   Continuous:   prismasol BGK 4/2.5      prismasol BGK 4/2.5     sodium chloride     cefTRIAXone (ROCEPHIN)  IV     furosemide     prismasol BGK 4/2.5     WCB:[id number] **OR** acetaminophen,  ondansetron **OR** ondansetron (ZOFRAN) IV, mouth rinse, sodium chloride Anti-infectives (From admission, onward)    Start     Dose/Rate Route Frequency Ordered Stop   05/18/23 2000  rifampin (RIFADIN) capsule 150 mg  Status:  Discontinued        150 mg Oral Daily 05/18/23 1900 05/18/23 1903   05/18/23 2000  cefTRIAXone (ROCEPHIN) 2 g in sodium chloride 0.9 % 100 mL IVPB        2 g 200 mL/hr over 30 Minutes Intravenous Every 24 hours 05/18/23 1902         Results for orders placed or performed during the hospital encounter of 05/17/23 (from the past 48 hour(s))  Basic metabolic panel     Status: Abnormal   Collection Time: 05/17/23 10:21 AM  Result Value Ref Range   Sodium 137 135 - 145 mmol/L   Potassium 5.1 3.5 - 5.1 mmol/L   Chloride 107 98 - 111 mmol/L   CO2 19 (L) 22 - 32 mmol/L   Glucose, Bld 115 (H) 70 - 99 mg/dL    Comment: Glucose reference range applies only to samples taken after fasting for at least 8 hours.   BUN 56 (H) 8 - 23 mg/dL   Creatinine, Ser 7.37 (H) 0.61 - 1.24 mg/dL   Calcium 8.8 (L) 8.9 - 10.3 mg/dL   GFR, Estimated 26 (L) >60 mL/min    Comment: (NOTE) Calculated using the CKD-EPI Creatinine Equation (2021)    Anion gap 11 5 - 15    Comment: Performed at [facility], [address], [place], [place] [address]  CBC     Status: Abnormal   Collection Time: 05/17/23 10:21 AM  Result Value Ref Range   WBC 12.0 (H) 4.0 - 10.5 K/uL   RBC 2.04 (L) 4.22 - 5.81 MIL/uL   Hemoglobin 6.0 (LL) 13.0 - 17.0 g/dL    Comment: REPEATED TO VERIFY THIS CRITICAL RESULT HAS VERIFIED AND BEEN CALLED TO CRAWFORD, D. RN BY ALIEFENDIC,FATIMA ON 09 10 2024 AT 1126, AND HAS BEEN READ BACK.     HCT 19.7 (L) 39.0 - 52.0 %   MCV 96.6 80.0 - 100.0 fL   MCH  29.4 26.0 - 34.0 pg   MCHC 30.5 30.0 - 36.0 g/dL   RDW 29.5 (H) 62.1 - 30.8 %   Platelets 128 (L) 150 - 400 K/uL   nRBC 0.0 0.0 - 0.2 %    Comment: Performed at [facility], [address] [place], [place] [address]  Troponin I (High Sensitivity)     Status: Abnormal   Collection Time: 05/17/23 10:21 AM  Result Value Ref Range   Troponin I (High Sensitivity) 26 (H) <18 ng/L    Comment: (NOTE) Elevated high sensitivity troponin I (hsTnI) values and significant  changes across serial measurements may suggest ACS but many other  chronic and acute conditions are known to elevate hsTnI results.  Refer to the "Links" section for chest pain algorithms and additional  guidance. Performed at [facility], [address], [place], [place] [address]   Type and screen [facility]     Status: None (Preliminary result)   Collection Time: 05/17/23 10:31 AM  Result Value Ref Range   ABO/RH(D) B POS    Antibody Screen NEG    Sample Expiration 05/20/2023,2359    Unit Number [id number]    Blood Component Type RED CELLS,LR    Unit division 00    Status of Unit ISSUED,FINAL    Transfusion Status OK TO TRANSFUSE    Crossmatch Result      Compatible Performed at [facility], [address], [place], [place] [address]    Unit Number [id number]    Blood Component Type RED CELLS,LR    Unit division 00    Status of Unit ISSUED    Transfusion Status OK TO TRANSFUSE    Crossmatch Result Compatible   Brain natriuretic peptide     Status: Abnormal   Collection Time: 05/17/23 11:00 AM  Result Value Ref Range   B Natriuretic Peptide 2,097.0 (H) 0.0 - 100.0 pg/mL    Comment: Performed at [facility], [address] [place], [place] [address]  Prepare RBC (crossmatch)     Status: None   Collection Time: 05/17/23 12:44 PM  Result Value Ref Range   Order Confirmation      ORDER PROCESSED BY BLOOD BANK Performed at [facility], [address], [place], [place] [address]   Troponin I (High Sensitivity)     Status: Abnormal   Collection Time: 05/17/23 12:50 PM  Result Value Ref  Range   Troponin I (High Sensitivity) 23 (H) <18 ng/L    Comment: (NOTE) Elevated high sensitivity troponin I (hsTnI) values and significant  changes across serial measurements may suggest ACS but many other  chronic and acute conditions are known to elevate hsTnI results.  Refer to the "Links" section for chest pain algorithms and additional  guidance. Performed at [facility], [address], [place], [place] [address]   Glucose, capillary     Status: Abnormal   Collection Time: 05/17/23  8:27 PM  Result Value Ref Range   Glucose-Capillary 122 (H) 70 - 99 mg/dL    Comment: Glucose reference range applies only to samples taken after fasting for at least 8 hours.  Iron and TIBC     Status: Abnormal   Collection Time: 05/17/23  9:16 PM  Result Value Ref Range   Iron 140 45 - 182 ug/dL   TIBC 166 (L) 063 - 016 ug/dL   Saturation Ratios 69 (H) 17.9 - 39.5 %  UIBC 64 ug/dL    Comment: Performed at [facility], [address] [place], [place] [address]  Ferritin     Status: Abnormal   Collection Time: 05/17/23  9:16 PM  Result Value Ref Range   Ferritin 2,200 (H) 24 - 336 ng/mL    Comment: Performed at [facility], [address], [place], [place] [address]  Hemoglobin and hematocrit, blood     Status: Abnormal   Collection Time: 05/17/23  9:16 PM  Result Value Ref Range   Hemoglobin 7.6 (L) 13.0 - 17.0 g/dL    Comment: REPEATED TO VERIFY POST TRANSFUSION SPECIMEN    HCT 24.2 (L) 39.0 - 52.0 %    Comment: Performed at [facility], [address] [place], [place] [address]  CBC     Status: Abnormal   Collection Time: 05/18/23  4:11 AM  Result Value Ref Range   WBC 12.1 (H) 4.0 - 10.5 K/uL   RBC 2.45 (L) 4.22 - 5.81 MIL/uL   Hemoglobin 7.6 (L) 13.0 - 17.0 g/dL   HCT 91.4 (L) 78.2 - 95.6 %   MCV 97.1 80.0 - 100.0 fL   MCH 31.0 26.0 - 34.0 pg   MCHC 31.9 30.0 - 36.0 g/dL   RDW 21.3 (H) 08.6 -  15.5 %   Platelets 110 (L) 150 - 400 K/uL   nRBC 0.0 0.0 - 0.2 %    Comment: Performed at [facility], [address] [place], [place] [address]  Comprehensive metabolic panel     Status: Abnormal   Collection Time: 05/18/23  4:11 AM  Result Value Ref Range   Sodium 138 135 - 145 mmol/L   Potassium 5.1 3.5 - 5.1 mmol/L   Chloride 107 98 - 111 mmol/L   CO2 16 (L) 22 - 32 mmol/L   Glucose, Bld 114 (H) 70 - 99 mg/dL    Comment: Glucose reference range applies only to samples taken after fasting for at least 8 hours.   BUN 61 (H) 8 - 23 mg/dL   Creatinine, Ser 9.62 (H) 0.61 - 1.24 mg/dL   Calcium 9.0 8.9 - 95.2 mg/dL   Total Protein 8.5 (H) 6.5 - 8.1 g/dL   Albumin 3.5 3.5 - 5.0 g/dL   AST 13 (L) 15 - 41 U/L   ALT 8 0 - 44 U/L   Alkaline Phosphatase 97 38 - 126 U/L   Total Bilirubin 2.0 (H) 0.3 - 1.2 mg/dL   GFR, Estimated 26 (L) >60 mL/min    Comment: (NOTE) Calculated using the CKD-EPI Creatinine Equation (2021)    Anion gap 15 5 - 15    Comment: Performed at [facility], [address], [place], [place] [address]  Phosphorus     Status: Abnormal   Collection Time: 05/18/23  4:11 AM  Result Value Ref Range   Phosphorus 5.5 (H) 2.5 - 4.6 mg/dL    Comment: Performed at [facility], [address], [place], [place] [address]  Glucose, capillary     Status: Abnormal   Collection Time: 05/18/23  7:32 AM  Result Value Ref Range   Glucose-Capillary 107 (H) 70 - 99 mg/dL    Comment: Glucose reference range applies only to samples taken after fasting for at least 8 hours.  Prepare RBC (crossmatch)     Status: None   Collection Time: 05/18/23  8:05 AM  Result Value Ref Range   Order Confirmation  ORDER PROCESSED BY BLOOD BANK Performed at [facility], [address], [place], [place] [address]   Glucose, capillary     Status: Abnormal   Collection Time: 05/18/23 11:17 AM  Result Value Ref Range    Glucose-Capillary 114 (H) 70 - 99 mg/dL    Comment: Glucose reference range applies only to samples taken after fasting for at least 8 hours.  Glucose, capillary     Status: Abnormal   Collection Time: 05/18/23  4:39 PM  Result Value Ref Range   Glucose-Capillary 113 (H) 70 - 99 mg/dL    Comment: Glucose reference range applies only to samples taken after fasting for at least 8 hours.    ECHO TEE  Result Date: 05/18/2023    TRANSESOPHOGEAL ECHO REPORT   Patient Name:   KALMAN LOUALLEN Date of Exam: 05/18/2023 Medical Rec #:  [id number]     Height:       66.0 in Accession #:    [id number]    Weight:       197.3 lb Date of Birth:  08-Jun-1961      BSA:          1.988 m Patient Age:    62 years      BP:           130/88 mmHg Patient Gender: M             HR:           80 bpm. Exam Location:  Inpatient Procedure: Transesophageal Echo Indications:    Aortic valve dehiscence  History:        Patient has prior history of Echocardiogram examinations. Aortic                 Valve Disease.  Sonographer:    Darlys Gales Referring Phys: [id number] MAHESH A CHANDRASEKHAR PROCEDURE: The transesophogeal probe was passed without difficulty through the esophogus of the patient. Sedation performed by different physician. The patient developed no complications during the procedure.  IMPRESSIONS  1. A 23 mm Magna Ease Valve is dehisced. There are two perivalular leaks, One adjacent to the left cusp where deshicence is most prominent and a second adjacent to the right cusp. Best seen in 3D plane crop and 3D glass views. There is no holodiastolic flow reversal. Deep gastric imaging not performed, due to moderate sedation due to tenuous status and need for emergency procedure. . The aortic valve has been repaired/replaced. Aortic valve regurgitation is moderate to severe.  2. A 12 mm x 11 mm echodensity subvalvular to the tricuspid valve annulus is noted. This does not cause the mechanism of central tricubspid regurgitation.. The  tricuspid valve is abnormal. Tricuspid valve regurgitation is severe.  3. Aortic dilatation noted. Aneurysm of the aortic root, measuring 50 mm. There is severe dilatation of the aortic root, measuring 50 mm. There is mild dilatation of the ascending aorta, measuring 43 mm.  4. Left ventricular ejection fraction, by estimation, is 55 to 60%. The left ventricle has normal function.  5. Large pleural effusion in both left and right lateral regions.  6. Right ventricular systolic function is normal. The right ventricular size is dilated. There is severely elevated pulmonary artery systolic pressure. The estimated right ventricular systolic pressure is 60.2 mmHg.  7. The inferior vena cava is dilated in size with <50% respiratory variability, suggesting right atrial pressure of 15 mmHg.  8. Left atrial size was mild to moderately dilated. No left atrial/left atrial appendage  thrombus was detected.  9. Right atrial size was mild to moderately dilated. 10. The mitral valve is normal in structure. Trivial mitral valve regurgitation. No evidence of mitral stenosis. FINDINGS  Left Ventricle: Left ventricular ejection fraction, by estimation, is 55 to 60%. The left ventricle has normal function. The left ventricular internal cavity size was normal in size. Right Ventricle: The right ventricular size is dilated. No increase in right ventricular wall thickness. Right ventricular systolic function is normal. There is severely elevated pulmonary artery systolic pressure. The tricuspid regurgitant velocity is 3.36 m/s, and with an assumed right atrial pressure of 15 mmHg, the estimated right ventricular systolic pressure is 60.2 mmHg. Left Atrium: Left atrial size was mild to moderately dilated. No left atrial/left atrial appendage thrombus was detected. Right Atrium: Right atrial size was mild to moderately dilated. Pericardium: There is no evidence of pericardial effusion. Mitral Valve: The mitral valve is normal in structure.  Trivial mitral valve regurgitation. No evidence of mitral valve stenosis. Tricuspid Valve: A 12 mm x 11 mm echodensity subvalvular to the tricuspid valve annulus is noted. This does not cause the mechanism of central tricubspid regurgitation. The tricuspid valve is abnormal. Tricuspid valve regurgitation is severe. No evidence  of tricuspid stenosis. Aortic Valve: A 23 mm Magna Ease Valve is dehisced. There are two perivalular leaks, One adjacent to the left cusp where deshicence is most prominent and a second adjacent to the right cusp. Best seen in 3D plane crop and 3D glass views. There is no holodiastolic flow reversal. Deep gastric imaging not performed, due to moderate sedation due to tenuous status and need for emergency procedure. The aortic valve has been repaired/replaced. Aortic valve regurgitation is moderate to severe. Pulmonic Valve: The pulmonic valve was normal in structure. Pulmonic valve regurgitation is not visualized. No evidence of pulmonic stenosis. Aorta: Aortic dilatation noted. There is severe dilatation of the aortic root, measuring 50 mm. There is mild dilatation of the ascending aorta, measuring 43 mm. There is an aneurysm involving the aortic root measuring 50 mm. Venous: The inferior vena cava is dilated in size with less than 50% respiratory variability, suggesting right atrial pressure of 15 mmHg. IAS/Shunts: There is right bowing of the interatrial septum, suggestive of elevated left atrial pressure. No atrial level shunt detected by color flow Doppler. Additional Comments: There is a large pleural effusion in both left and right lateral regions. TRICUSPID VALVE TR Peak grad:   45.2 mmHg TR Vmax:        336.00 cm/s Riley Lam MD Electronically signed by Riley Lam MD Signature Date/Time: 05/18/2023/7:21:33 PM    Final    ECHOCARDIOGRAM COMPLETE  Result Date: 05/18/2023    ECHOCARDIOGRAM REPORT   Patient Name:   ABDOULAYE MACLELLAN Date of Exam: 05/18/2023 Medical Rec  #:  [id number]     Height:       66.0 in Accession #:    [id number]    Weight:       197.3 lb Date of Birth:  April 11, 1961      BSA:          1.988 m Patient Age:    62 years      BP:           114/94 mmHg Patient Gender: M             HR:           75 bpm. Exam Location:  Inpatient Procedure: 2D Echo, Cardiac Doppler and Color Doppler  REPORT CONTAINS CRITICAL RESULT Indications:    Endocarditis I38                 Dyspnea R06.00  History:        Patient has prior history of Echocardiogram examinations, most                 recent 01/29/2023. Stroke, Aortic Valve Disease,                 Signs/Symptoms:Dyspnea, Shortness of Breath and Murmur; Risk                 Factors:Diabetes.                 Aortic Valve: Edwards MAGNA EASE valve is present in the                 aortic position.  Sonographer:    Darlys Gales Referring Phys: [id number] [place] IMPRESSIONS  1. Limited study. Left ventricular ejection fraction, by estimation, is 50 to 55%. The left ventricle has low normal function. The left ventricle has no regional wall motion abnormalities. There is mild left ventricular hypertrophy. Left ventricular diastolic parameters are indeterminate.  2. Right ventricular systolic function is normal. The right ventricular size is normal.  3. The mitral valve is normal in structure. Trivial mitral valve regurgitation.  4. Tricuspid valve regurgitation is mild to moderate.  5. Prosthetic valve non-coronary cusp appears to be dehisced with significant PVL which is new. Aortic valve regurgitation is not visualized. There is a Edwards MAGNA EASE valve present in the aortic position.  6. Aneurysm of the aortic root, measuring 47 mm. Conclusion(s)/Recommendation(s): The aortic valve prosthesis appears to be dehisced. Recommend a TEE/ CT SUR. Communicated with the primary team. FINDINGS  Left Ventricle: Limited study. Left ventricular ejection fraction, by estimation, is 50 to 55%. The left ventricle has low normal  function. The left ventricle has no regional wall motion abnormalities. The left ventricular internal cavity size was normal in size. There is mild left ventricular hypertrophy. Left ventricular diastolic parameters are indeterminate. Right Ventricle: The right ventricular size is normal. Right ventricular systolic function is normal. Left Atrium: Left atrial size was normal in size. Right Atrium: Right atrial size was normal in size. Pericardium: There is no evidence of pericardial effusion. Mitral Valve: The mitral valve is normal in structure. Trivial mitral valve regurgitation. Tricuspid Valve: Tricuspid valve regurgitation is mild to moderate. Aortic Valve: Prosthetic valve non-coronary cusp appears to be dehisced with significant PVL which is new. Aortic valve regurgitation is not visualized. Aortic valve peak gradient measures 22.8 mmHg. There is a Edwards MAGNA EASE valve present in the aortic position. Pulmonic Valve: Pulmonic valve regurgitation is not visualized. Aorta: There is an aneurysm involving the aortic root measuring 47 mm. IAS/Shunts: The interatrial septum was not well visualized.  LEFT VENTRICLE PLAX 2D LVIDd:         5.40 cm LVIDs:         4.00 cm LV PW:         1.20 cm LV IVS:        0.80 cm LVOT diam:     2.40 cm LV SV:         89 LV SV Index:   45 LVOT Area:     4.52 cm  LEFT ATRIUM           Index LA Vol (A4C): 54.1 ml 27.21 ml/m  AORTIC VALVE AV Area (Vmax): 1.39 cm AV Vmax:        239.00 cm/s AV Peak Grad:   22.8 mmHg LVOT Vmax:      73.30 cm/s LVOT Vmean:     59.800 cm/s LVOT VTI:       0.196 m  AORTA Ao Root diam: 4.70 cm TRICUSPID VALVE TR Peak grad:   47.1 mmHg TR Vmax:        343.00 cm/s  SHUNTS Systemic VTI:  0.20 m Systemic Diam: 2.40 cm Carolan Clines Electronically signed by Carolan Clines Signature Date/Time: 05/18/2023/3:37:07 PM    Final    DG Chest Port 1 View  Result Date: 05/17/2023 CLINICAL DATA:  SHORTNESS OF BREATH. CHEST PAIN. LOWER EXTREMITY SWELLING. EXAM:  PORTABLE CHEST 1 VIEW COMPARISON:  05/16/2023 FINDINGS: There is a right chest wall dialysis catheter with tips in the distal SVC. Postoperative changes from prior median sternotomy. Heart size appears stable. There is moderate diffuse pulmonary vascular congestion. No frank edema. No pleural fluid or consolidative change. IMPRESSION: Moderate pulmonary vascular congestion. Electronically Signed   By: Signa Kell M.D.   On: 05/17/2023 13:15    Review of Systems  Constitutional:  Positive for fatigue and unexpected weight change. Negative for activity change, chills and fever.  HENT: Negative.  Negative for dental problem.   Eyes: Negative.   Respiratory:  Positive for cough and shortness of breath.   Cardiovascular:  Positive for leg swelling. Negative for chest pain.  Gastrointestinal: Negative.   Endocrine: Negative.   Genitourinary: Negative.   Musculoskeletal: Negative.   Neurological:  Negative for dizziness and syncope.  Hematological: Negative.   Psychiatric/Behavioral: Negative.     Blood pressure 114/69, pulse 74, temperature (!) 97.2 F (36.2 C), temperature source Oral, resp. rate (!) 25, height 5\' 6"  (1.676 m), weight 89.5 kg, SpO2 95%. Physical Exam Constitutional:      Appearance: He is well-developed.  HENT:     Head: Normocephalic and atraumatic.  Eyes:     Extraocular Movements: Extraocular movements intact.     Pupils: Pupils are equal, round, and reactive to light.  Cardiovascular:     Rate and Rhythm: Normal rate and regular rhythm.     Heart sounds: Murmur heard.     Comments: 3/6 systolic murmur along the right sternal border and 2/6 diastolic murmur along the left lower sternal border. Pulmonary:     Effort: Pulmonary effort is normal.     Breath sounds: Rales present.  Abdominal:     General: Abdomen is flat. Bowel sounds are normal. There is no distension.     Palpations: Abdomen is soft.     Tenderness: There is no abdominal tenderness.   Musculoskeletal:        General: Swelling present. Normal range of motion.     Cervical back: Normal range of motion and neck supple.  Skin:    General: Skin is warm and dry.  Neurological:     General: No focal deficit present.     Mental Status: He is alert and oriented to person, place, and time.  Psychiatric:        Mood and Affect: Mood normal.        Behavior: Behavior normal.    TRANSESOPHOGEAL ECHO REPORT       Patient Name:   NAUMAN HEAGY Date of Exam: 05/18/2023  Medical Rec #:  [id number]     Height:       66.0 in  Accession #:  [id number]    Weight:       197.3 lb  Date of Birth:  Oct 18, 1960      BSA:          1.988 m  Patient Age:    62 years      BP:           130/88 mmHg  Patient Gender: M             HR:           80 bpm.  Exam Location:  Inpatient   Procedure: Transesophageal Echo   Indications:    Aortic valve dehiscence    History:        Patient has prior history of Echocardiogram examinations.  Aortic                 Valve Disease.    Sonographer:    Darlys Gales  Referring Phys: [id number] MAHESH A CHANDRASEKHAR   PROCEDURE: The transesophogeal probe was passed without difficulty through  the esophogus of the patient. Sedation performed by different physician.  The patient developed no complications during the procedure.    IMPRESSIONS     1. A 23 mm Magna Ease Valve is dehisced. There are two perivalular leaks,  One adjacent to the left cusp where deshicence is most prominent and a  second adjacent to the right cusp. Best seen in 3D plane crop and 3D glass  views. There is no holodiastolic  flow reversal. Deep gastric imaging not performed, due to moderate  sedation due to tenuous status and need for emergency procedure. . The  aortic valve has been repaired/replaced. Aortic valve regurgitation is  moderate to severe.   2. A 12 mm x 11 mm echodensity subvalvular to the tricuspid valve annulus  is noted. This does not cause the mechanism of  central tricubspid  regurgitation.. The tricuspid valve is abnormal. Tricuspid valve  regurgitation is severe.   3. Aortic dilatation noted. Aneurysm of the aortic root, measuring 50 mm.  There is severe dilatation of the aortic root, measuring 50 mm. There is  mild dilatation of the ascending aorta, measuring 43 mm.   4. Left ventricular ejection fraction, by estimation, is 55 to 60%. The  left ventricle has normal function.   5. Large pleural effusion in both left and right lateral regions.   6. Right ventricular systolic function is normal. The right ventricular  size is dilated. There is severely elevated pulmonary artery systolic  pressure. The estimated right ventricular systolic pressure is 60.2 mmHg.   7. The inferior vena cava is dilated in size with <50% respiratory  variability, suggesting right atrial pressure of 15 mmHg.   8. Left atrial size was mild to moderately dilated. No left atrial/left  atrial appendage thrombus was detected.   9. Right atrial size was mild to moderately dilated.  10. The mitral valve is normal in structure. Trivial mitral valve  regurgitation. No evidence of mitral stenosis.   FINDINGS   Left Ventricle: Left ventricular ejection fraction, by estimation, is 55  to 60%. The left ventricle has normal function. The left ventricular  internal cavity size was normal in size.   Right Ventricle: The right ventricular size is dilated. No increase in  right ventricular wall thickness. Right ventricular systolic function is  normal. There is severely elevated pulmonary artery systolic pressure. The  tricuspid regurgitant velocity is  3.36 m/s, and with an assumed right atrial pressure of 15 mmHg, the  estimated right ventricular systolic pressure is 60.2 mmHg.   Left Atrium: Left atrial size was mild to moderately dilated. No left  atrial/left atrial appendage thrombus was detected.   Right Atrium: Right atrial size was mild to moderately dilated.    Pericardium: There is no evidence of pericardial effusion.   Mitral Valve: The mitral valve is normal in structure. Trivial mitral  valve regurgitation. No evidence of mitral valve stenosis.   Tricuspid Valve: A 12 mm x 11 mm echodensity subvalvular to the tricuspid  valve annulus is noted. This does not cause the mechanism of central  tricubspid regurgitation. The tricuspid valve is abnormal. Tricuspid valve  regurgitation is severe. No evidence   of tricuspid stenosis.   Aortic Valve: A 23 mm Magna Ease Valve is dehisced. There are two  perivalular leaks, One adjacent to the left cusp where deshicence is most  prominent and a second adjacent to the right cusp. Best seen in 3D plane  crop and 3D glass views. There is no  holodiastolic flow reversal. Deep gastric imaging not performed, due to  moderate sedation due to tenuous status and need for emergency procedure.  The aortic valve has been repaired/replaced. Aortic valve regurgitation is  moderate to severe.   Pulmonic Valve: The pulmonic valve was normal in structure. Pulmonic valve  regurgitation is not visualized. No evidence of pulmonic stenosis.   Aorta: Aortic dilatation noted. There is severe dilatation of the aortic  root, measuring 50 mm. There is mild dilatation of the ascending aorta,  measuring 43 mm. There is an aneurysm involving the aortic root measuring  50 mm.   Venous: The inferior vena cava is dilated in size with less than 50%  respiratory variability, suggesting right atrial pressure of 15 mmHg.   IAS/Shunts: There is right bowing of the interatrial septum, suggestive of  elevated left atrial pressure. No atrial level shunt detected by color  flow Doppler.   Additional Comments: There is a large pleural effusion in both left and  right lateral regions.   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  He presented with progressive volume overload and congestive heart failure after discontinuation of hemodialysis 1 week ago.  The etiology of his prosthetic aortic valve dehiscence is either endocarditis or progressive enlargement of his aneurysm with weakening of the tissue between the annulus and the sewing ring.  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  I do not think he is in condition at this time to undergo surgery.  He should have blood cultures sent tonight and be started on empiric antibiotic therapy.  He will need to undergo dialysis for volume removal and correction of acidosis and hyperkalemia.  If he gets tuned up well enough then he could undergo surgical treatment which will require  aortic root replacement and vein graft extensions of his bypass grafts to the ascending aortic graft.  I think his risk of major morbidity and mortality with this procedure is at least 10 to 15%.  Payton Doughty Ebonee Stober 05/18/2023, 8:00 PM

## 2023-05-18 NOTE — TOC CM/SW Note (Addendum)
Transition of Care [facility]) - Inpatient Brief Assessment   Patient Details  Name: Boone Cuebas MRN: [id number] Date of Birth: 01-15-1961  Transition of Care [facility] CM/SW Contact:    Howell Rucks, RN Phone Number: 05/18/2023, 12:29 PM   Clinical Narrative: Met with pt at bedside to introduce role of TOC/NCM and review for dc planning. Pt confirmed he has PCP and pharmacy in place. Pt reports he was recently receiving HH services with [facility], NCM will confirm, reports he has a walker and cane he uses as needed. Pt reports she feels safe returning home with support from his family, confirmed family will provide transportation at discharge. MOON completed.  PT eval, await recommendation. TOC Brief Assessment completed. No TOC needs identified at this time.   -1:12pm PT eval completed, recommendation for [facility]. Pt agreeable with previous home health services with [facility]. Teams chat to attending to enter [facility] PT order.      Transition of Care Asessment: Insurance and Status: Insurance coverage has been reviewed Patient has primary care physician: Yes Home environment has been reviewed: resides in private residence with support from family/significant other Prior level of function:: Independent with cane/walker as needed Prior/Current Home Services: No current home services Social Determinants of Health Reivew: SDOH reviewed no interventions necessary Readmission risk has been reviewed: Yes Transition of care needs: no transition of care needs at this time

## 2023-05-19 ENCOUNTER — Inpatient Hospital Stay (HOSPITAL_COMMUNITY): Payer: PPO

## 2023-05-19 ENCOUNTER — Encounter (HOSPITAL_COMMUNITY): Payer: Self-pay | Admitting: Student

## 2023-05-19 ENCOUNTER — Other Ambulatory Visit: Payer: PPO

## 2023-05-19 ENCOUNTER — Ambulatory Visit: Payer: PPO | Admitting: Hematology and Oncology

## 2023-05-19 DIAGNOSIS — I5043 Acute on chronic combined systolic (congestive) and diastolic (congestive) heart failure: Secondary | ICD-10-CM | POA: Diagnosis not present

## 2023-05-19 DIAGNOSIS — I25810 Atherosclerosis of coronary artery bypass graft(s) without angina pectoris: Secondary | ICD-10-CM | POA: Diagnosis not present

## 2023-05-19 DIAGNOSIS — I351 Nonrheumatic aortic (valve) insufficiency: Secondary | ICD-10-CM | POA: Diagnosis not present

## 2023-05-19 DIAGNOSIS — M7989 Other specified soft tissue disorders: Secondary | ICD-10-CM | POA: Diagnosis not present

## 2023-05-19 DIAGNOSIS — J9601 Acute respiratory failure with hypoxia: Secondary | ICD-10-CM | POA: Diagnosis not present

## 2023-05-19 DIAGNOSIS — D6189 Other specified aplastic anemias and other bone marrow failure syndromes: Secondary | ICD-10-CM | POA: Diagnosis not present

## 2023-05-19 DIAGNOSIS — I350 Nonrheumatic aortic (valve) stenosis: Secondary | ICD-10-CM | POA: Diagnosis not present

## 2023-05-19 DIAGNOSIS — I509 Heart failure, unspecified: Secondary | ICD-10-CM | POA: Diagnosis not present

## 2023-05-19 LAB — MAGNESIUM: Magnesium: 2.3 mg/dL (ref 1.7–2.4)

## 2023-05-19 LAB — RENAL FUNCTION PANEL
Albumin: 3.3 g/dL — ABNORMAL LOW (ref 3.5–5.0)
Albumin: 3.4 g/dL — ABNORMAL LOW (ref 3.5–5.0)
Anion gap: 16 — ABNORMAL HIGH (ref 5–15)
Anion gap: 14 (ref 5–15)
BUN: 49 mg/dL — ABNORMAL HIGH (ref 8–23)
BUN: 44 mg/dL — ABNORMAL HIGH (ref 8–23)
CO2: 18 mmol/L — ABNORMAL LOW (ref 22–32)
CO2: 18 mmol/L — ABNORMAL LOW (ref 22–32)
Calcium: 8.6 mg/dL — ABNORMAL LOW (ref 8.9–10.3)
Calcium: 8.5 mg/dL — ABNORMAL LOW (ref 8.9–10.3)
Chloride: 104 mmol/L (ref 98–111)
Chloride: 103 mmol/L (ref 98–111)
Creatinine, Ser: 2.1 mg/dL — ABNORMAL HIGH (ref 0.61–1.24)
Creatinine, Ser: 1.97 mg/dL — ABNORMAL HIGH (ref 0.61–1.24)
GFR, Estimated: 35 mL/min — ABNORMAL LOW (ref >60)
GFR, Estimated: 37 mL/min — ABNORMAL LOW (ref >60)
Glucose, Bld: 101 mg/dL — ABNORMAL HIGH (ref 70–99)
Glucose, Bld: 91 mg/dL (ref 70–99)
Phosphorus: 4 mg/dL (ref 2.5–4.6)
Phosphorus: 3.8 mg/dL (ref 2.5–4.6)
Potassium: 5 mmol/L (ref 3.5–5.1)
Potassium: 4.6 mmol/L (ref 3.5–5.1)
Sodium: 138 mmol/L (ref 135–145)
Sodium: 135 mmol/L (ref 135–145)

## 2023-05-19 LAB — CBC WITH DIFFERENTIAL/PLATELET
Abs Immature Granulocytes: 0.34 10*3/uL — ABNORMAL HIGH (ref 0.00–0.07)
Basophils Absolute: 0 10*3/uL (ref 0.0–0.1)
Basophils Relative: 0 %
Eosinophils Absolute: 0 10*3/uL (ref 0.0–0.5)
Eosinophils Relative: 0 %
HCT: 25.1 % — ABNORMAL LOW (ref 39.0–52.0)
Hemoglobin: 8.1 g/dL — ABNORMAL LOW (ref 13.0–17.0)
Immature Granulocytes: 3 %
Lymphocytes Relative: 15 %
Lymphs Abs: 1.6 10*3/uL (ref 0.7–4.0)
MCH: 28.8 pg (ref 26.0–34.0)
MCHC: 32.3 g/dL (ref 30.0–36.0)
MCV: 89.3 fL (ref 80.0–100.0)
Monocytes Absolute: 3.3 10*3/uL — ABNORMAL HIGH (ref 0.1–1.0)
Monocytes Relative: 30 %
Neutro Abs: 5.5 10*3/uL (ref 1.7–7.7)
Neutrophils Relative %: 52 %
Platelets: 114 10*3/uL — ABNORMAL LOW (ref 150–400)
RBC: 2.81 MIL/uL — ABNORMAL LOW (ref 4.22–5.81)
RDW: 21.1 % — ABNORMAL HIGH (ref 11.5–15.5)
Smear Review: DECREASED
WBC: 10.8 10*3/uL — ABNORMAL HIGH (ref 4.0–10.5)
nRBC: 0 % (ref 0.0–0.2)

## 2023-05-19 LAB — TYPE AND SCREEN
ABO/RH(D): B POS
Antibody Screen: NEGATIVE
Unit division: 0
Unit division: 0

## 2023-05-19 LAB — GLUCOSE, CAPILLARY
Glucose-Capillary: 108 mg/dL — ABNORMAL HIGH (ref 70–99)
Glucose-Capillary: 112 mg/dL — ABNORMAL HIGH (ref 70–99)
Glucose-Capillary: 113 mg/dL — ABNORMAL HIGH (ref 70–99)
Glucose-Capillary: 141 mg/dL — ABNORMAL HIGH (ref 70–99)
Glucose-Capillary: 148 mg/dL — ABNORMAL HIGH (ref 70–99)
Glucose-Capillary: 90 mg/dL (ref 70–99)
Glucose-Capillary: 91 mg/dL (ref 70–99)

## 2023-05-19 LAB — BPAM RBC
Blood Product Expiration Date: 202410042359
Blood Product Expiration Date: 202410042359
ISSUE DATE / TIME: 202409101542
ISSUE DATE / TIME: 202409111303
Unit Type and Rh: 7300
Unit Type and Rh: 7300

## 2023-05-19 LAB — SURGICAL PCR SCREEN
MRSA, PCR: NEGATIVE
Staphylococcus aureus: NEGATIVE

## 2023-05-19 LAB — LACTATE DEHYDROGENASE: LDH: 248 U/L — ABNORMAL HIGH (ref 98–192)

## 2023-05-19 LAB — SARS CORONAVIRUS 2 BY RT PCR: SARS Coronavirus 2 by RT PCR: NEGATIVE

## 2023-05-19 LAB — PATHOLOGIST SMEAR REVIEW

## 2023-05-19 LAB — PREPARE RBC (CROSSMATCH)

## 2023-05-19 LAB — VITAMIN B12: Vitamin B-12: 701 pg/mL (ref 180–914)

## 2023-05-19 MED ORDER — PHENYLEPHRINE HCL-NACL 20-0.9 MG/250ML-% IV SOLN
30.0000 ug/min | INTRAVENOUS | Status: DC
  Filled 2023-05-19: qty 250

## 2023-05-19 MED ORDER — NOREPINEPHRINE 4 MG/250ML-% IV SOLN
0.0000 ug/min | INTRAVENOUS | Status: AC
  Administered 2023-05-20: 4 ug/min via INTRAVENOUS
  Filled 2023-05-19: qty 250

## 2023-05-19 MED ORDER — TEMAZEPAM 15 MG PO CAPS: 15.0000 mg | ORAL_CAPSULE | Freq: Once | ORAL | Status: DC | PRN

## 2023-05-19 MED ORDER — MANNITOL 20 % IV SOLN
INTRAVENOUS | Status: DC
  Filled 2023-05-19: qty 13

## 2023-05-19 MED ORDER — HEPARIN SODIUM (PORCINE) 5000 UNIT/ML IJ SOLN
5000.0000 [IU] | Freq: Three times a day (TID) | INTRAMUSCULAR | Status: DC
  Administered 2023-05-19 – 2023-05-20 (×3): 5000 [IU] via SUBCUTANEOUS
  Filled 2023-05-19 (×3): qty 1

## 2023-05-19 MED ORDER — TRANEXAMIC ACID 1000 MG/10ML IV SOLN
1.5000 mg/kg/h | INTRAVENOUS | Status: DC
  Filled 2023-05-19: qty 25

## 2023-05-19 MED ORDER — TRANEXAMIC ACID (OHS) PUMP PRIME SOLUTION
2.0000 mg/kg | INTRAVENOUS | Status: DC
  Filled 2023-05-19: qty 1.76

## 2023-05-19 MED ORDER — CHLORHEXIDINE GLUCONATE 0.12 % MT SOLN
15.0000 mL | Freq: Once | OROMUCOSAL | Status: AC
  Administered 2023-05-20: 15 mL via OROMUCOSAL
  Filled 2023-05-19: qty 15

## 2023-05-19 MED ORDER — CEFAZOLIN SODIUM-DEXTROSE 2-4 GM/100ML-% IV SOLN
2.0000 g | INTRAVENOUS | Status: DC
  Filled 2023-05-19: qty 100

## 2023-05-19 MED ORDER — CEFAZOLIN SODIUM-DEXTROSE 2-4 GM/100ML-% IV SOLN
2.0000 g | INTRAVENOUS | Status: AC
  Administered 2023-05-20 (×2): 2 g via INTRAVENOUS
  Filled 2023-05-19: qty 100

## 2023-05-19 MED ORDER — METOPROLOL TARTRATE 12.5 MG HALF TABLET
12.5000 mg | ORAL_TABLET | Freq: Once | ORAL | Status: AC
  Administered 2023-05-20: 12.5 mg via ORAL
  Filled 2023-05-19: qty 1

## 2023-05-19 MED ORDER — MUPIROCIN 2 % EX OINT
1.0000 | TOPICAL_OINTMENT | Freq: Two times a day (BID) | CUTANEOUS | Status: AC
  Administered 2023-05-19 – 2023-05-23 (×9): 1 via NASAL
  Filled 2023-05-19 (×3): qty 22

## 2023-05-19 MED ORDER — BISACODYL 5 MG PO TBEC
5.0000 mg | DELAYED_RELEASE_TABLET | Freq: Once | ORAL | Status: AC
  Administered 2023-05-19: 5 mg via ORAL
  Filled 2023-05-19: qty 1

## 2023-05-19 MED ORDER — HEPARIN 30,000 UNITS/1000 ML (OHS) CELLSAVER SOLUTION
Status: DC
  Filled 2023-05-19: qty 1000

## 2023-05-19 MED ORDER — TRANEXAMIC ACID (OHS) BOLUS VIA INFUSION
15.0000 mg/kg | INTRAVENOUS | Status: AC
  Administered 2023-05-20: 1323 mg via INTRAVENOUS
  Filled 2023-05-19: qty 1323

## 2023-05-19 MED ORDER — MILRINONE LACTATE IN DEXTROSE 20-5 MG/100ML-% IV SOLN
0.3000 ug/kg/min | INTRAVENOUS | Status: DC
  Filled 2023-05-19: qty 100

## 2023-05-19 MED ORDER — DEXMEDETOMIDINE HCL IN NACL 400 MCG/100ML IV SOLN
0.1000 ug/kg/h | INTRAVENOUS | Status: DC
  Filled 2023-05-19: qty 100

## 2023-05-19 MED ORDER — EPINEPHRINE HCL 5 MG/250ML IV SOLN IN NS
0.0000 ug/min | INTRAVENOUS | Status: AC
  Administered 2023-05-20: 3 ug/min via INTRAVENOUS
  Filled 2023-05-19: qty 250

## 2023-05-19 MED ORDER — NITROGLYCERIN IN D5W 200-5 MCG/ML-% IV SOLN
2.0000 ug/min | INTRAVENOUS | Status: DC
  Filled 2023-05-19: qty 250

## 2023-05-19 MED ORDER — PLASMA-LYTE A IV SOLN
INTRAVENOUS | Status: DC
  Filled 2023-05-19: qty 2.5

## 2023-05-19 MED ORDER — POTASSIUM CHLORIDE 2 MEQ/ML IV SOLN
80.0000 meq | INTRAVENOUS | Status: DC
  Filled 2023-05-19: qty 40

## 2023-05-19 MED ORDER — INSULIN REGULAR(HUMAN) IN NACL 100-0.9 UT/100ML-% IV SOLN
INTRAVENOUS | Status: AC
  Administered 2023-05-20: 2 [IU]/h via INTRAVENOUS
  Administered 2023-05-20: 4 [IU]/h via INTRAVENOUS
  Filled 2023-05-19: qty 100

## 2023-05-19 MED ORDER — DIAZEPAM 5 MG PO TABS
5.0000 mg | ORAL_TABLET | Freq: Once | ORAL | Status: AC
  Administered 2023-05-20: 5 mg via ORAL
  Filled 2023-05-19: qty 1

## 2023-05-19 MED ORDER — VANCOMYCIN HCL 1500 MG/300ML IV SOLN
1500.0000 mg | INTRAVENOUS | Status: DC
  Filled 2023-05-19: qty 300

## 2023-05-19 MED ORDER — CHLORHEXIDINE GLUCONATE CLOTH 2 % EX PADS
6.0000 | MEDICATED_PAD | Freq: Once | CUTANEOUS | Status: AC
  Administered 2023-05-19: 6 via TOPICAL

## 2023-05-19 MED ORDER — CHLORHEXIDINE GLUCONATE CLOTH 2 % EX PADS: 6.0000 | MEDICATED_PAD | Freq: Once | CUTANEOUS | Status: DC

## 2023-05-19 NOTE — Progress Notes (Signed)
TCTS   Subjective:  Irritated that he is stuck in bed on CRRT, can't sleep, can't lay down due to SOB.  CRRT started last pm and keeping -200 overnight. -1118 cc yesterday.  Has been on 15 L HFNC with sats 92%. Coughing up blood tinged pink sputum.  Objective: Vital signs in last 24 hours: Temp:  [97.2 F (36.2 C)-97.8 F (36.6 C)] 97.8 F (36.6 C) (09/12 0320) Pulse Rate:  [72-82] 81 (09/12 0600) Cardiac Rhythm: Normal sinus rhythm (09/11 2000) Resp:  [19-36] 23 (09/12 0600) BP: (93-137)/(47-94) 107/56 (09/12 0600) SpO2:  [85 %-100 %] 95 % (09/12 0600) Weight:  [88.2 kg] 88.2 kg (09/12 0626)  Hemodynamic parameters for last 24 hours:    Intake/Output from previous day: 09/11 0701 - 09/12 0700 In: 1335.4 [P.O.:200; I.V.:28; Blood:446; IV Piggyback:661.4] Out: 2454 [Urine:250] Intake/Output this shift: Total I/O In: 761.4 [P.O.:200; IV Piggyback:561.4] Out: 2454 [Urine:250]  General appearance: alert and cooperative Neurologic: intact Heart: regular rate and rhythm and 3/6 systolic, 2/6 diastolic murmur. Lungs: rales bilaterally Abdomen: soft, protuberant, non-tender; bowel sounds normal Extremities: edema moderate in legs  Lab Results: Recent Labs    05/17/23 1021 05/17/23 2116 05/18/23 0411  WBC 12.0*  --  12.1*  HGB 6.0* 7.6* 7.6*  HCT 19.7* 24.2* 23.8*  PLT 128*  --  110*   BMET:  Recent Labs    05/17/23 1021 05/18/23 0411  NA 137 138  K 5.1 5.1  CL 107 107  CO2 19* 16*  GLUCOSE 115* 114*  BUN 56* 61*  CREATININE 2.70* 2.64*  CALCIUM 8.8* 9.0    PT/INR: No results for input(s): "LABPROT", "INR" in the last 72 hours. ABG    Component Value Date/Time   PHART 7.416 01/29/2023 1615   HCO3 27.3 01/29/2023 1615   TCO2 29 01/29/2023 1615   ACIDBASEDEF 4.0 (H) 01/29/2023 1221   O2SAT 99 01/29/2023 1615   CBG (last 3)  Recent Labs    05/18/23 2113 05/19/23 0058 05/19/23 0321  GLUCAP 118* 112* 90   CXR: increased pulmonary vascular  congestion compared to yesterday.    Assessment/Plan:  He has been hemodynamically stable in sinus rhythm. No high grade heart block. Diastolic pressure ok.  He still has significant volume overload with likely severe pulmonary HTN and pulmonary edema based on CXR and coughing up pink sputum. Need to continue removing volume to try to clear lungs up and improve oxygen requirement. I doubt that he would be able to oxygenate at the end of a long pump run with his lungs in the current state.   Blood cultures pending. Continue empiric antibiotic.   LOS: 1 day    Alleen Borne 05/19/2023

## 2023-05-19 NOTE — Progress Notes (Signed)
VASCULAR LAB    Carotid duplex has been performed.  See CV proc for preliminary results.   Saqib Cazarez, RVT 05/19/2023, 2:06 PM

## 2023-05-19 NOTE — Progress Notes (Signed)
RT attempted to start patient on HHFNC. Pt refused. HHFNC on standby.

## 2023-05-19 NOTE — Consult Note (Signed)
NAME:  [name], MRN:  [id number], DOB:  May 05, 1961, LOS: 1 ADMISSION DATE:  05/17/2023, CONSULTATION DATE:  9/11 REFERRING MD:  Dr. Izora Ribas, CHIEF COMPLAINT:  aortic valve dehiscence   History of Present Illness:  Patient is [name] w/ pertinent PMH CAD s/p CABG 2017 w/ AVR (magna ease for bicuspid valve disease), HLD, HTN, prior CVA, T2DM, CKD4 was previously on HD presents to [facility] on 9/10 chest pain.  Patient recently admitted to [facility] on 5/25 w/ AKI and AMS. While in ED patient had PEA arrest w/ ROSC in about 8 minutes. Patient required dialysis on this admission. Also w/ MSSA bacteremia associated w/ tricuspid valve endocarditis. Patient transferred to [facility] on 5/25. Treated w/ 6 week course oxacillin and rifampin. This admission also suffered a R cerebellar CVA w/ MRI likely septic emboli and had cholangitis/pancreatitis requiring ERCP.  On 9/10 patient admitted to [facility] w/ chest pain, dyspnea, and BLE edema. BNP 2,097. CXR w/ pulm edema. Patient started on IV lasix infusion. Cards consulted. On 9/11 patient transferred to [facility]. Patient's echo showing possible aortic valve dehiscence. Cultures repeated and started on rocephin. Patient transferred to ICU for TEE and general anesthesia and to start CRRT. PCCM consulted.  Pertinent  Medical History   Past Medical History:  Diagnosis Date   Allergy    Anemia    Anxiety    Baker's cyst of knee    Blood transfusion without reported diagnosis    as baby    Coronary artery disease    quadruple bypass - March 2016   GERD (gastroesophageal reflux disease)    Gouty arthritis    "real bad" (01/17/2013)   Heart murmur    Hypercholesteremia    Hypertension    MSSA bacteremia 01/29/2023   Myocardial infarction (HCC) 2017   PEA (Pulseless electrical activity) (HCC) 01/29/2023   Stroke (HCC)    Type II diabetes mellitus (HCC)      Significant Hospital Events: Including procedures, antibiotic start and stop dates  in addition to other pertinent events   9/10 admitted 9/11 echo showing aortic valve dehiscence, confirmed by TEE.  9/11 started (back) on CRRT for volume overload and known CKD 4  Interim History / Subjective:  Improved oxygenation with fluid removal on CRRT. Breathing more comfortably.   Objective   Blood pressure 106/63, pulse 82, temperature 97.7 F (36.5 C), temperature source Oral, resp. rate (!) 32, height 5\' 6"  (1.676 m), weight 88.2 kg, SpO2 96%.        Intake/Output Summary (Last 24 hours) at 05/19/2023 0930 Last data filed at 05/19/2023 0900 Gross per 24 hour  Intake 1575.42 ml  Output 3054 ml  Net -1478.58 ml   Filed Weights   05/17/23 2025 05/17/23 2100 05/19/23 0626  Weight: 89.5 kg 89.5 kg 88.2 kg    Examination: General: NAD HEENT: MM pink/moist; [place] in place Neuro: Aox3; MAE CV: s1s2, RRR, 2/6 systolic murmur. Couldn't appreciate diastolic component.  PULM:  Crackles at both bases. salter  15 L/m; RR 23 GI: soft, bsx4 active  Extremities: warm/dry, ble edema L>R; r tunneled HD Skin: no rashes or lesions appreciated  Ancillary Tests Personally Reviewed:   Creatinine: 2.10 WBC: 10.8, PLT 114, HB: 8.1 Blood cultures NGTD   Assessment & Plan:  Prosthetic aortic valve dehiscence w/ mod-severe AVR due to MSSA bacteremia in 01/2023, S/P AVR at [facility] -Treat as PVE empirically.  -CTS consulted though high risk for surgery -trend wbc/fever curve -follow  cultures -cont rocephin and vanc -palliative care consulted as surgery may not be an option.   Acute respiratory failure w/ hypoxia due to volume overload, with untreated OSA -CRRT for volume removal -will start on heated HFNC for sats >92% -pulm toilet: IS/flutter -CXR in am  Acute on chronic combined CHF -Currently tolerating fluid removal with CRRT.   CKD 4. Had recently been on HD -cont CRRT per nephrology aiming for net fluid removal   Acute on chronic anemia Thrombocytopenia -Hemolysis work  up  -Hematology has been following as outpatient.   L>R BLE swelling -check dvt US  CAD s/p CABG in 2017 w/ AVR HTN HLD -statin and zetia -hold coreg  Hx of CVA -statin and zetia  DMT2 -ssi and cbg monitoring   Best Practice (right click and "Reselect all SmartList Selections" daily)   Diet/type: Regular consistency (see orders) DVT prophylaxis: SCD and [place] heparin.  GI prophylaxis: N/A Lines: Central line and Dialysis Catheter Foley:  N/A Code Status:  full code Last date of multidisciplinary goals of care discussion [pending]  CRITICAL CARE Performed by: Lynnell Catalan   Total critical care time: 35 minutes  Critical care time was exclusive of separately billable procedures and treating other patients.  Critical care was necessary to treat or prevent imminent or life-threatening deterioration.  Critical care was time spent personally by me on the following activities: development of treatment plan with patient and/or surrogate as well as nursing, discussions with consultants, evaluation of patient's response to treatment, examination of patient, obtaining history from patient or surrogate, ordering and performing treatments and interventions, ordering and review of laboratory studies, ordering and review of radiographic studies, pulse oximetry, re-evaluation of patient's condition and participation in multidisciplinary rounds.  Lynnell Catalan, MD [facility] ICU Physician [facility] Critical Care  Pager: [phone] Mobile: [phone] After hours: [phone].

## 2023-05-19 NOTE — H&P (View-Only) (Signed)
Subjective:  He feels better this afternoon.  Says that his breathing is better and his cough is improved.  He has -1600 cc this shift on CRRT.  Sats are improved to 94 to 95% with oxygen decreased to 12 L high flow nasal cannula.  Objective: Vital signs in last 24 hours: Temp:  [97.2 F (36.2 C)-97.8 F (36.6 C)] 97.6 F (36.4 C) (09/12 1200) Pulse Rate:  [72-90] 83 (09/12 1600) Cardiac Rhythm: Normal sinus rhythm (09/12 1600) Resp:  [15-36] 26 (09/12 1600) BP: (93-137)/(47-93) 119/77 (09/12 1600) SpO2:  [85 %-99 %] 94 % (09/12 1600) Weight:  [88.2 kg] 88.2 kg (09/12 0626)  Hemodynamic parameters for last 24 hours:    Intake/Output from previous day: 09/11 0701 - 09/12 0700 In: 1335.4 [P.O.:200; I.V.:28; Blood:446; IV Piggyback:661.4] Out: 2654 [Urine:250] Intake/Output this shift: Total I/O In: 340 [P.O.:340] Out: 1953   General appearance: alert and cooperative Heart: regular rate and rhythm and systolic/diastolic murmur is unchanged Lungs: Few rales bilaterally  Lab Results: Recent Labs    05/18/23 0411 05/19/23 0636  WBC 12.1* 10.8*  HGB 7.6* 8.1*  HCT 23.8* 25.1*  PLT 110* 114*   BMET:  Recent Labs    05/18/23 0411 05/19/23 0636  NA 138 138  K 5.1 5.0  CL 107 104  CO2 16* 18*  GLUCOSE 114* 101*  BUN 61* 49*  CREATININE 2.64* 2.10*  CALCIUM 9.0 8.6*    PT/INR: No results for input(s): "LABPROT", "INR" in the last 72 hours. ABG    Component Value Date/Time   PHART 7.416 01/29/2023 1615   HCO3 27.3 01/29/2023 1615   TCO2 29 01/29/2023 1615   ACIDBASEDEF 4.0 (H) 01/29/2023 1221   O2SAT 99 01/29/2023 1615   CBG (last 3)  Recent Labs    05/19/23 0321 05/19/23 0803 05/19/23 1125  GLUCAP 90 148* 91    Assessment/Plan:  His shortness of breath and oxygenation have improved significantly today with volume removal by CRRT.  I think we probably should proceed with surgery tomorrow to decrease the risk of decompensation over the weekend.  He  is not going to get better until this mechanical problem of aortic valve dehiscence is corrected. I discussed the operative procedure with the patient and his brother including alternatives, benefits and risks; including but not limited to bleeding, blood transfusion, infection, stroke, myocardial infarction, graft failure, heart block requiring a permanent pacemaker, organ dysfunction, and death.  [name] understands and agrees to proceed.  Plan surgery in the morning.  LOS: 1 day    Alleen Borne 05/19/2023

## 2023-05-19 NOTE — Progress Notes (Signed)
VASCULAR LAB    Bilateral lower extremity venous duplex has been performed.  See CV proc for preliminary results.   Kalia Vahey, RVT 05/19/2023, 2:05 PM

## 2023-05-19 NOTE — Progress Notes (Signed)
Physical Therapy Treatment Patient Details Name: [name] MRN: [id number] DOB: 14-Dec-1960 Today's Date: 05/19/2023   History of Present Illness 62 yo male presents to therapy following hospital admission on 05/17/2023 due to progressive weakness,B LE edema, SOB and angina. Pt recently stopped dialysis and symptoms presented since that time. Pt was found to be anemic requiring I unit PRBC. Pt was hospitalized in June of this year with a nosebleed and presented to ED from a SNF 6/21. 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    PT Comments  Pt progressed well on CRRT today though in a limited space due to lines.  Emphasis on warm up, transition to EOB, sit to stands, marching in place and around in a small area.  VSS overall, pt mildly fatigued.     If plan is discharge home, recommend the following: A little help with walking and/or transfers;A little help with bathing/dressing/bathroom;Assistance with cooking/housework;Assist for transportation;Help with stairs or ramp for entrance   Can travel by private vehicle        Equipment Recommendations  None recommended by PT    Recommendations for Other Services       Precautions / Restrictions Precautions Precautions: Fall Precaution Comments: on CRRT     Mobility  Bed Mobility Overal bed mobility: Needs Assistance Bed Mobility: Supine to Sit     Supine to sit: Min assist          Transfers Overall transfer level: Needs assistance Equipment used: None Transfers: Sit to/from Stand, Bed to chair/wheelchair/BSC Sit to Stand: Contact guard assist   Step pivot transfers: Contact guard assist            Ambulation/Gait Ambulation/Gait assistance: Contact guard assist             General Gait Details: marching in place with wider BOS.  Approx 50 steps with end point at recliner 5 feet away.   Stairs             Wheelchair Mobility     Tilt Bed    Modified Rankin (Stroke Patients Only)       Balance Overall balance assessment: Needs assistance Sitting-balance support: No upper extremity supported, Single extremity supported, Feet supported, Feet unsupported Sitting balance-Leahy Scale: Fair       Standing balance-Leahy Scale: Fair                              Cognition Arousal: Alert Behavior During Therapy: WFL for tasks assessed/performed Overall Cognitive Status: Within Functional Limits for tasks assessed                                          Exercises Other Exercises Other Exercises: warm up hip/knee ROM with graded resistance.    General Comments General comments (skin integrity, edema, etc.): vss on RA, On CRRT      Pertinent Vitals/Pain Pain Assessment Pain Assessment: Faces Faces Pain Scale: No hurt Pain Intervention(s): Monitored during session    Home Living                          Prior Function  PT Goals (current goals can now be found in the care plan section) Acute Rehab PT Goals PT Goal Formulation: With patient Time For Goal Achievement: 06/01/23 Potential to Achieve Goals: Good Progress towards PT goals: Progressing toward goals    Frequency    Min 1X/week      PT Plan      Co-evaluation              AM-PAC PT "6 Clicks" Mobility   Outcome Measure  Help needed turning from your back to your side while in a flat bed without using bedrails?: A Little Help needed moving from lying on your back to sitting on the side of a flat bed without using bedrails?: A Little Help needed moving to and from a bed to a chair (including a wheelchair)?: A Little Help needed standing up from a chair using your arms (e.g., wheelchair or bedside chair)?: A Little Help needed to walk in hospital room?: A  Little Help needed climbing 3-5 steps with a railing? : Total 6 Click Score: 16    End of Session   Activity Tolerance: Patient tolerated treatment well;Patient limited by fatigue Patient left: in chair;with call bell/phone within reach;with chair alarm set Nurse Communication: Mobility status PT Visit Diagnosis: Other abnormalities of gait and mobility (R26.89);Unsteadiness on feet (R26.81)     Time: 1610-9604 PT Time Calculation (min) (ACUTE ONLY): 27 min  Charges:    $Gait Training: 8-22 mins $Therapeutic Activity: 8-22 mins PT General Charges $$ ACUTE PT VISIT: 1 Visit                     05/19/2023  Jacinto Halim., PT Acute Rehabilitation Services [phone]  (office)   [name] 05/19/2023, 5:34 PM

## 2023-05-19 NOTE — Procedures (Signed)
Admit: 05/17/2023 LOS: 1  64M recently recovered GFR from dialysis dependent AKI with dehiscence  of prosthetic aortic value and severe AI/TR  Current CRRT Prescription: Start Date: 05/19/23 Catheter: R internal jugular TDC, present prior to admission BFR: 300 Pre Blood Pump: 400 4K DFR: 1000 4K Replacement Rate: 400 4K Goal UF: -253mL/h net negative Anticoagulation: none Clotting: none  S:  Started CRRT for vol overload after TTE with AV dehiscence and severe AI Tolerating UF well, -1.3L thus far CV and TCTS notes reviewed, long ter mplan pending.   K 5.0, P 4.0  O: 09/11 0701 - 09/12 0700 In: 1335.4 [P.O.:200; I.V.:28; Blood:446; IV Piggyback:661.4] Out: 2654 [Urine:250]  Filed Weights   05/17/23 2025 05/17/23 2100 05/19/23 0626  Weight: 89.5 kg 89.5 kg 88.2 kg    Recent Labs  Lab 05/17/23 1021 05/18/23 0411 05/19/23 0636  NA 137 138 138  K 5.1 5.1 5.0  CL 107 107 104  CO2 19* 16* 18*  GLUCOSE 115* 114* 101*  BUN 56* 61* 49*  CREATININE 2.70* 2.64* 2.10*  CALCIUM 8.8* 9.0 8.6*  PHOS  --  5.5* 4.0   Recent Labs  Lab 05/17/23 0852 05/17/23 0852 05/17/23 1021 05/17/23 2116 05/18/23 0411 05/19/23 0636  WBC 11.6*  --  12.0*  --  12.1* 10.8*  NEUTROABS 4.4  --   --   --   --  5.5  HGB 6.4*   < > 6.0* 7.6* 7.6* 8.1*  HCT 20.0*  --  19.7* 24.2* 23.8* 25.1*  MCV 93.9  --  96.6  --  97.1 89.3  PLT 109*  --  128*  --  110* 114*   < > = values in this interval not displayed.    Scheduled Meds:  sodium chloride   Intravenous Once   acetaminophen  650 mg Oral Once   atorvastatin  80 mg Oral q1800   Chlorhexidine Gluconate Cloth  6 each Topical Daily   ezetimibe  10 mg Oral Daily   heparin injection (subcutaneous)  5,000 Units Subcutaneous Q8H   insulin aspart  0-6 Units Subcutaneous Q4H   mupirocin ointment  1 Application Nasal BID   Continuous Infusions:   prismasol BGK 4/2.5 400 mL/hr at 05/19/23 0844    prismasol BGK 4/2.5 400 mL/hr at 05/19/23 0845    sodium chloride     anticoagulant sodium citrate     cefTRIAXone (ROCEPHIN)  IV Stopped (05/18/23 2137)   prismasol BGK 4/2.5 1,000 mL/hr at 05/19/23 0610   vancomycin     PRN Meds:.acetaminophen **OR** acetaminophen, anticoagulant sodium citrate, ondansetron **OR** ondansetron (ZOFRAN) IV, mouth rinse, sodium chloride  ABG    Component Value Date/Time   PHART 7.416 01/29/2023 1615   PCO2ART 42.3 01/29/2023 1615   PO2ART 116 (H) 01/29/2023 1615   HCO3 27.3 01/29/2023 1615   TCO2 29 01/29/2023 1615   ACIDBASEDEF 4.0 (H) 01/29/2023 1221   O2SAT 99 01/29/2023 1615    A Dialysis dependent AKI after recent discontinuation of ESRD as an outpatient with volume overload from #2 Prosthetic AV dehiscence with severe AI, also severe TR.  Treating for potential associated IE Recent discontinuation of outpt HD after AKI with residual CKD4.   Severe anemia, heme following, likely multifactorial CAD hx/o CABG 2017 Hx/o CVA DM2  P: No change in CRRT settings, cont UF   [name], MD  [employer]

## 2023-05-19 NOTE — Progress Notes (Addendum)
Progress Note  Patient Name: [name] Date of Encounter: 05/19/2023 Primary Cardiologist: Kristeen Miss, MD   Subjective   Overnight -13 L with CRRT. Patient notes that he is feeling ok.  He is optimistic about surgery  Vital Signs    Vitals:   05/19/23 0626 05/19/23 0630 05/19/23 0645 05/19/23 0700  BP:  114/68 113/62   Pulse:  86 85 85  Resp:  (!) 26 (!) 29 (!) 23  Temp:      TempSrc:      SpO2:  91% 91% 92%  Weight: 88.2 kg     Height:        Intake/Output Summary (Last 24 hours) at 05/19/2023 0807 Last data filed at 05/19/2023 0700 Gross per 24 hour  Intake 1335.42 ml  Output 2654 ml  Net -1318.58 ml   Filed Weights   05/17/23 2025 05/17/23 2100 05/19/23 0626  Weight: 89.5 kg 89.5 kg 88.2 kg    Physical Exam   GEN: No acute distress.   Neck: prominent JVD Cardiac: RRR, no rubs, or gallops. Diastolic murmur > Systolic murmur Respiratory: Crackles bilaterally bilaterally. GI: Soft, nontender, distended with fluid wave  MS: Non pitting edema  Labs  EKG: SR with 1st HB PR 289 Telemetry: SR with Mobitz Type I HB and PVCs   Chemistry Recent Labs  Lab 05/17/23 0950 05/17/23 1021 05/18/23 0411  NA 137 137 138  K 5.8* 5.1 5.1  CL 108 107 107  CO2 21* 19* 16*  GLUCOSE 110* 115* 114*  BUN 53* 56* 61*  CREATININE 2.71* 2.70* 2.64*  CALCIUM 9.0 8.8* 9.0  PROT 8.2*  --  8.5*  ALBUMIN 3.5  --  3.5  AST 14*  --  13*  ALT <5  --  8  ALKPHOS 98  --  97  BILITOT 1.5*  --  2.0*  GFRNONAA 26* 26* 26*  ANIONGAP 8 11 15      Hematology Recent Labs  Lab 05/17/23 0852 05/17/23 1021 05/17/23 2116 05/18/23 0411  WBC 11.6* 12.0*  --  12.1*  RBC 2.13* 2.04*  --  2.45*  HGB 6.4* 6.0* 7.6* 7.6*  HCT 20.0* 19.7* 24.2* 23.8*  MCV 93.9 96.6  --  97.1  MCH 30.0 29.4  --  31.0  MCHC 32.0 30.5  --  31.9  RDW 22.1* 22.2*  --  20.7*  PLT 109* 128*  --  110*    Cardiac EnzymesNo results for input(s): "TROPONINI" in the last 168 hours. No results for  input(s): "TROPIPOC" in the last 168 hours.   BNP Recent Labs  Lab 05/17/23 1100  BNP 2,097.0*     DDimer No results for input(s): "DDIMER" in the last 168 hours.   Cardiac Studies   Cardiac Studies & Procedures   CARDIAC CATHETERIZATION  CARDIAC CATHETERIZATION 11/19/2015  Narrative 1. Mid RCA lesion, 85% stenosed. 2. RPDA lesion, 75% stenosed. 3. Prox Cx to Dist Cx lesion, 65% stenosed. 4. Ost 2nd Mrg to 2nd Mrg lesion, 60% stenosed. 5. LM lesion, 50% stenosed. 6. 1st Diag lesion, 80% stenosed. 7. Mid LAD lesion, 90% stenosed. 8. Dist LAD lesion, 50% stenosed.   Severe coronary disease with high-grade segmental stenosis in the mid LAD, high-grade obstruction in the first diagonal, significant stenosis in the mid RCA, and eccentric segmental 50% left main.  The circumflex coronary artery contains a proximal and mid 50-60% stenoses.  Left ventriculography was not performed due to markedly elevated end-diastolic pressures, 46 mmHg.  Severe pulmonary hypertension,  measured at 103/53 mmHg  Marked elevation and pulmonary capillary wedge mean pressure 33 mmHg  Known severe aortic regurgitation with hemodynamics compatible with acute on chronic process.  RECOMMENDATIONS:   The patient will be moved to the intensive care unit or a transitional care unit bed  Aggressive diuresis to lower pulmonary pressures and end-diastolic pressure.  Consider advanced heart failure service consultation.  Consider decreasing the dose of beta blocker therapy as this will shorten diastolic left ventricular filling time and may help to lower LVEDP and LV end-diastolic dimension.  Hemodynamically, the patient is severely compromised. CO-OX 53%.  Monitor kidney function. 90 cc of contrast was used for the procedure.  Will need aortic valve replacement and multivessel coronary bypass grafting.  Findings Coronary Findings Diagnostic  Dominance: Co-dominant  Left Main Eccentric.  Left  Anterior Descending The lesion is type C Diffuse eccentric. The lesion is type C.  First Diagonal Branch  First Septal Branch The vessel is small in size.  Ramus Intermedius . Vessel is small.  Left Circumflex . Vessel is small. Eccentric.  Second Obtuse Marginal Branch The vessel is large in size. Eccentric.  Third Obtuse Marginal Branch The vessel is small in size.  Right Coronary Artery The lesion is type C Eccentric.  Right Posterior Descending Artery The lesion is type C.  Intervention  No interventions have been documented.     ECHOCARDIOGRAM  ECHOCARDIOGRAM COMPLETE 05/18/2023  Narrative ECHOCARDIOGRAM REPORT    Patient Name:   [name] Date of Exam: 05/18/2023 Medical Rec #:  [id number]     Height:       66.0 in Accession #:    [id number]    Weight:       197.3 lb Date of Birth:  1961/06/26      BSA:          1.988 m Patient Age:    62 years      BP:           114/94 mmHg Patient Gender: M             HR:           75 bpm. Exam Location:  Inpatient  Procedure: 2D Echo, Cardiac Doppler and Color Doppler  REPORT CONTAINS CRITICAL RESULT  Indications:    Endocarditis I38 Dyspnea R06.00  History:        Patient has prior history of Echocardiogram examinations, most recent 01/29/2023. Stroke, Aortic Valve Disease, Signs/Symptoms:Dyspnea, Shortness of Breath and Murmur; Risk Factors:Diabetes. Aortic Valve: Edwards MAGNA EASE valve is present in the aortic position.  Sonographer:    Darlys Gales Referring Phys: [id number] [place]  IMPRESSIONS   1. Limited study. Left ventricular ejection fraction, by estimation, is 50 to 55%. The left ventricle has low normal function. The left ventricle has no regional wall motion abnormalities. There is mild left ventricular hypertrophy. Left ventricular diastolic parameters are indeterminate. 2. Right ventricular systolic function is normal. The right ventricular size is normal. 3. The mitral valve  is normal in structure. Trivial mitral valve regurgitation. 4. Tricuspid valve regurgitation is mild to moderate. 5. Prosthetic valve non-coronary cusp appears to be dehisced with significant PVL which is new. Aortic valve regurgitation is not visualized. There is a Edwards MAGNA EASE valve present in the aortic position. 6. Aneurysm of the aortic root, measuring 47 mm.  Conclusion(s)/Recommendation(s): The aortic valve prosthesis appears to be dehisced. Recommend a TEE/ CT SUR. Communicated with the primary team.  FINDINGS  Left Ventricle: Limited study. Left ventricular ejection fraction, by estimation, is 50 to 55%. The left ventricle has low normal function. The left ventricle has no regional wall motion abnormalities. The left ventricular internal cavity size was normal in size. There is mild left ventricular hypertrophy. Left ventricular diastolic parameters are indeterminate.  Right Ventricle: The right ventricular size is normal. Right ventricular systolic function is normal.  Left Atrium: Left atrial size was normal in size.  Right Atrium: Right atrial size was normal in size.  Pericardium: There is no evidence of pericardial effusion.  Mitral Valve: The mitral valve is normal in structure. Trivial mitral valve regurgitation.  Tricuspid Valve: Tricuspid valve regurgitation is mild to moderate.  Aortic Valve: Prosthetic valve non-coronary cusp appears to be dehisced with significant PVL which is new. Aortic valve regurgitation is not visualized. Aortic valve peak gradient measures 22.8 mmHg. There is a Edwards MAGNA EASE valve present in the aortic position.  Pulmonic Valve: Pulmonic valve regurgitation is not visualized.  Aorta: There is an aneurysm involving the aortic root measuring 47 mm.  IAS/Shunts: The interatrial septum was not well visualized.   LEFT VENTRICLE PLAX 2D LVIDd:         5.40 cm LVIDs:         4.00 cm LV PW:         1.20 cm LV IVS:         0.80 cm LVOT diam:     2.40 cm LV SV:         89 LV SV Index:   45 LVOT Area:     4.52 cm   LEFT ATRIUM           Index LA Vol (A4C): 54.1 ml 27.21 ml/m AORTIC VALVE AV Area (Vmax): 1.39 cm AV Vmax:        239.00 cm/s AV Peak Grad:   22.8 mmHg LVOT Vmax:      73.30 cm/s LVOT Vmean:     59.800 cm/s LVOT VTI:       0.196 m  AORTA Ao Root diam: 4.70 cm  TRICUSPID VALVE TR Peak grad:   47.1 mmHg TR Vmax:        343.00 cm/s  SHUNTS Systemic VTI:  0.20 m Systemic Diam: 2.40 cm  Carolan Clines Electronically signed by Carolan Clines Signature Date/Time: 05/18/2023/3:37:07 PM    Final   TEE  ECHO TEE 05/18/2023  Narrative TRANSESOPHOGEAL ECHO REPORT    Patient Name:   [name] Date of Exam: 05/18/2023 Medical Rec #:  [id number]     Height:       66.0 in Accession #:    [id number]    Weight:       197.3 lb Date of Birth:  01/19/1961      BSA:          1.988 m Patient Age:    62 years      BP:           130/88 mmHg Patient Gender: M             HR:           80 bpm. Exam Location:  Inpatient  Procedure: Transesophageal Echo  Indications:    Aortic valve dehiscence  History:        Patient has prior history of Echocardiogram examinations. Aortic Valve Disease.  Sonographer:    Darlys Gales Referring Phys: [id number] Zephaniah Lubrano A Carmen Tolliver  PROCEDURE: The transesophogeal probe was passed without  difficulty through the esophogus of the patient. Sedation performed by different physician. The patient developed no complications during the procedure.  IMPRESSIONS   1. A 23 mm Magna Ease Valve is dehisced. There are two perivalular leaks, One adjacent to the left cusp where deshicence is most prominent and a second adjacent to the right cusp. Best seen in 3D plane crop and 3D glass views. There is no holodiastolic flow reversal. Deep gastric imaging not performed, due to moderate sedation due to tenuous status and need for emergency procedure. . The aortic valve has been  repaired/replaced. Aortic valve regurgitation is moderate to severe. 2. A 12 mm x 11 mm echodensity subvalvular to the tricuspid valve annulus is noted. This does not cause the mechanism of central tricubspid regurgitation.. The tricuspid valve is abnormal. Tricuspid valve regurgitation is severe. 3. Aortic dilatation noted. Aneurysm of the aortic root, measuring 50 mm. There is severe dilatation of the aortic root, measuring 50 mm. There is mild dilatation of the ascending aorta, measuring 43 mm. 4. Left ventricular ejection fraction, by estimation, is 55 to 60%. The left ventricle has normal function. 5. Large pleural effusion in both left and right lateral regions. 6. Right ventricular systolic function is normal. The right ventricular size is dilated. There is severely elevated pulmonary artery systolic pressure. The estimated right ventricular systolic pressure is 60.2 mmHg. 7. The inferior vena cava is dilated in size with <50% respiratory variability, suggesting right atrial pressure of 15 mmHg. 8. Left atrial size was mild to moderately dilated. No left atrial/left atrial appendage thrombus was detected. 9. Right atrial size was mild to moderately dilated. 10. The mitral valve is normal in structure. Trivial mitral valve regurgitation. No evidence of mitral stenosis.  FINDINGS Left Ventricle: Left ventricular ejection fraction, by estimation, is 55 to 60%. The left ventricle has normal function. The left ventricular internal cavity size was normal in size.  Right Ventricle: The right ventricular size is dilated. No increase in right ventricular wall thickness. Right ventricular systolic function is normal. There is severely elevated pulmonary artery systolic pressure. The tricuspid regurgitant velocity is 3.36 m/s, and with an assumed right atrial pressure of 15 mmHg, the estimated right ventricular systolic pressure is 60.2 mmHg.  Left Atrium: Left atrial size was mild to moderately  dilated. No left atrial/left atrial appendage thrombus was detected.  Right Atrium: Right atrial size was mild to moderately dilated.  Pericardium: There is no evidence of pericardial effusion.  Mitral Valve: The mitral valve is normal in structure. Trivial mitral valve regurgitation. No evidence of mitral valve stenosis.  Tricuspid Valve: A 12 mm x 11 mm echodensity subvalvular to the tricuspid valve annulus is noted. This does not cause the mechanism of central tricubspid regurgitation. The tricuspid valve is abnormal. Tricuspid valve regurgitation is severe. No evidence of tricuspid stenosis.  Aortic Valve: A 23 mm Magna Ease Valve is dehisced. There are two perivalular leaks, One adjacent to the left cusp where deshicence is most prominent and a second adjacent to the right cusp. Best seen in 3D plane crop and 3D glass views. There is no holodiastolic flow reversal. Deep gastric imaging not performed, due to moderate sedation due to tenuous status and need for emergency procedure. The aortic valve has been repaired/replaced. Aortic valve regurgitation is moderate to severe.  Pulmonic Valve: The pulmonic valve was normal in structure. Pulmonic valve regurgitation is not visualized. No evidence of pulmonic stenosis.  Aorta: Aortic dilatation noted. There is severe dilatation of the  aortic root, measuring 50 mm. There is mild dilatation of the ascending aorta, measuring 43 mm. There is an aneurysm involving the aortic root measuring 50 mm.  Venous: The inferior vena cava is dilated in size with less than 50% respiratory variability, suggesting right atrial pressure of 15 mmHg.  IAS/Shunts: There is right bowing of the interatrial septum, suggestive of elevated left atrial pressure. No atrial level shunt detected by color flow Doppler.  Additional Comments: There is a large pleural effusion in both left and right lateral regions.  TRICUSPID VALVE TR Peak grad:   45.2 mmHg TR Vmax:         336.00 cm/s  Riley Lam MD Electronically signed by Riley Lam MD Signature Date/Time: 05/18/2023/7:21:33 PM    Final   MONITORS  CARDIAC EVENT MONITOR 04/25/2018  Narrative  Sinus rhythm  No significant arrhythmias            Assessment & Plan   Prosthetic Aortic Valve Dehiscence Hx of tricuspid subvalvular endocarditis Severe TR Significant AI SoV Aneurysm with hx of bicuspid aortic valve - options are essentially surgery with high mortality or comfort care - Has new Mobitz Type I HB - TCTS involved, we will see if he can be optimized for surgery - Bcx NGTD - no imaging evidence of abscess, daily EKG ordered  Volume Overload ESRD - on CRRT, appreciate nephrology recs, I do not suspect he return to not needed diaylsis  History of prior strokes - atorvastatin and zetia  for the prior TIA - cerebellar stroke at [facility] was likely cardioembolic  Anemia Thrombocytopenia Hx of DVT - haptoglobin and LDH were not drawn, likely as orders were placed while he was still at [facility], will reorder (concern about hemolysis) - appreciate heme recs  CRITICAL CARE Performed by: Javar Eshbach A Renard Caperton  Total critical care time: 30 minutes. Critical care time was exclusive of separately billable procedures and treating other patients. Critical care was necessary to treat or prevent imminent or life-threatening deterioration. Critical care was time spent personally by me on the following activities: development of treatment plan with patient and/or surrogate as well as nursing, discussions with consultants, evaluation of patient's response to treatment, examination of patient, obtaining history from patient or surrogate, ordering and performing treatments and interventions, ordering and review of laboratory studies, ordering and review of radiographic studies, pulse oximetry and re-evaluation of patient's condition.    Signed, Riley Lam, MD FASE  [facility]  [facility]  05/19/2023 9:23 AM    For questions or updates, please contact [facility] Please consult [email or web address] for contact info under Cardiology/STEMI.      Riley Lam, MD FASE [facility] Cardiologist [facility]  [address] [place], [address] [place], [place] [address] [phone]  8:07 AM

## 2023-05-19 NOTE — Plan of Care (Signed)
Pt due to have AVR 05/20/23 with Dr Laneta Simmers

## 2023-05-19 NOTE — Progress Notes (Signed)
Subjective:  He feels better this afternoon.  Says that his breathing is better and his cough is improved.  He has -1600 cc this shift on CRRT.  Sats are improved to 94 to 95% with oxygen decreased to 12 L high flow nasal cannula.  Objective: Vital signs in last 24 hours: Temp:  [97.2 F (36.2 C)-97.8 F (36.6 C)] 97.6 F (36.4 C) (09/12 1200) Pulse Rate:  [72-90] 83 (09/12 1600) Cardiac Rhythm: Normal sinus rhythm (09/12 1600) Resp:  [15-36] 26 (09/12 1600) BP: (93-137)/(47-93) 119/77 (09/12 1600) SpO2:  [85 %-99 %] 94 % (09/12 1600) Weight:  [88.2 kg] 88.2 kg (09/12 0626)  Hemodynamic parameters for last 24 hours:    Intake/Output from previous day: 09/11 0701 - 09/12 0700 In: 1335.4 [P.O.:200; I.V.:28; Blood:446; IV Piggyback:661.4] Out: 2654 [Urine:250] Intake/Output this shift: Total I/O In: 340 [P.O.:340] Out: 1953   General appearance: alert and cooperative Heart: regular rate and rhythm and systolic/diastolic murmur is unchanged Lungs: Few rales bilaterally  Lab Results: Recent Labs    05/18/23 0411 05/19/23 0636  WBC 12.1* 10.8*  HGB 7.6* 8.1*  HCT 23.8* 25.1*  PLT 110* 114*   BMET:  Recent Labs    05/18/23 0411 05/19/23 0636  NA 138 138  K 5.1 5.0  CL 107 104  CO2 16* 18*  GLUCOSE 114* 101*  BUN 61* 49*  CREATININE 2.64* 2.10*  CALCIUM 9.0 8.6*    PT/INR: No results for input(s): "LABPROT", "INR" in the last 72 hours. ABG    Component Value Date/Time   PHART 7.416 01/29/2023 1615   HCO3 27.3 01/29/2023 1615   TCO2 29 01/29/2023 1615   ACIDBASEDEF 4.0 (H) 01/29/2023 1221   O2SAT 99 01/29/2023 1615   CBG (last 3)  Recent Labs    05/19/23 0321 05/19/23 0803 05/19/23 1125  GLUCAP 90 148* 91    Assessment/Plan:  His shortness of breath and oxygenation have improved significantly today with volume removal by CRRT.  I think we probably should proceed with surgery tomorrow to decrease the risk of decompensation over the weekend.  He  is not going to get better until this mechanical problem of aortic valve dehiscence is corrected. I discussed the operative procedure with the patient and his brother including alternatives, benefits and risks; including but not limited to bleeding, blood transfusion, infection, stroke, myocardial infarction, graft failure, heart block requiring a permanent pacemaker, organ dysfunction, and death.  [name] understands and agrees to proceed.  Plan surgery in the morning.  LOS: 1 day    [name] 05/19/2023

## 2023-05-20 ENCOUNTER — Inpatient Hospital Stay (HOSPITAL_COMMUNITY): Payer: PPO | Admitting: Certified Registered Nurse Anesthetist

## 2023-05-20 ENCOUNTER — Other Ambulatory Visit: Payer: Self-pay

## 2023-05-20 ENCOUNTER — Encounter (HOSPITAL_COMMUNITY): Admission: EM | Disposition: E | Payer: Self-pay | Source: Home / Self Care | Attending: Surgery

## 2023-05-20 ENCOUNTER — Inpatient Hospital Stay (HOSPITAL_COMMUNITY): Payer: PPO

## 2023-05-20 ENCOUNTER — Encounter (HOSPITAL_COMMUNITY): Payer: Self-pay | Admitting: Student

## 2023-05-20 DIAGNOSIS — T8111XA Postprocedural  cardiogenic shock, initial encounter: Secondary | ICD-10-CM | POA: Diagnosis not present

## 2023-05-20 DIAGNOSIS — J9601 Acute respiratory failure with hypoxia: Secondary | ICD-10-CM | POA: Diagnosis not present

## 2023-05-20 DIAGNOSIS — I361 Nonrheumatic tricuspid (valve) insufficiency: Secondary | ICD-10-CM | POA: Diagnosis not present

## 2023-05-20 DIAGNOSIS — I350 Nonrheumatic aortic (valve) stenosis: Secondary | ICD-10-CM | POA: Diagnosis not present

## 2023-05-20 HISTORY — PX: TEE WITHOUT CARDIOVERSION: SHX5443

## 2023-05-20 HISTORY — PX: TRICUSPID VALVE REPLACEMENT: SHX816

## 2023-05-20 HISTORY — PX: AORTIC VALVE REPLACEMENT: SHX41

## 2023-05-20 HISTORY — PX: ASCENDING AORTIC ROOT REPLACEMENT: SHX5729

## 2023-05-20 LAB — CBC
HCT: 22.9 % — ABNORMAL LOW (ref 39.0–52.0)
HCT: 23.2 % — ABNORMAL LOW (ref 39.0–52.0)
Hemoglobin: 7.3 g/dL — ABNORMAL LOW (ref 13.0–17.0)
Hemoglobin: 7.9 g/dL — ABNORMAL LOW (ref 13.0–17.0)
MCH: 28.9 pg (ref 26.0–34.0)
MCH: 29.6 pg (ref 26.0–34.0)
MCHC: 31.9 g/dL (ref 30.0–36.0)
MCHC: 34.1 g/dL (ref 30.0–36.0)
MCV: 90.5 fL (ref 80.0–100.0)
MCV: 86.9 fL (ref 80.0–100.0)
Platelets: 118 10*3/uL — ABNORMAL LOW (ref 150–400)
Platelets: 117 10*3/uL — ABNORMAL LOW (ref 150–400)
RBC: 2.53 MIL/uL — ABNORMAL LOW (ref 4.22–5.81)
RBC: 2.67 MIL/uL — ABNORMAL LOW (ref 4.22–5.81)
RDW: 21 % — ABNORMAL HIGH (ref 11.5–15.5)
RDW: 18.2 % — ABNORMAL HIGH (ref 11.5–15.5)
WBC: 12.3 10*3/uL — ABNORMAL HIGH (ref 4.0–10.5)
WBC: 25 10*3/uL — ABNORMAL HIGH (ref 4.0–10.5)
nRBC: 0.4 % — ABNORMAL HIGH (ref 0.0–0.2)
nRBC: 1.8 % — ABNORMAL HIGH (ref 0.0–0.2)

## 2023-05-20 LAB — POCT I-STAT 7, (LYTES, BLD GAS, ICA,H+H)
Acid-Base Excess: 1 mmol/L (ref 0.0–2.0)
Acid-Base Excess: 1 mmol/L (ref 0.0–2.0)
Acid-base deficit: 3 mmol/L — ABNORMAL HIGH (ref 0.0–2.0)
Acid-base deficit: 6 mmol/L — ABNORMAL HIGH (ref 0.0–2.0)
Acid-base deficit: 4 mmol/L — ABNORMAL HIGH (ref 0.0–2.0)
Acid-base deficit: 12 mmol/L — ABNORMAL HIGH (ref 0.0–2.0)
Acid-base deficit: 2 mmol/L (ref 0.0–2.0)
Acid-base deficit: 1 mmol/L (ref 0.0–2.0)
Acid-base deficit: 3 mmol/L — ABNORMAL HIGH (ref 0.0–2.0)
Acid-base deficit: 4 mmol/L — ABNORMAL HIGH (ref 0.0–2.0)
Acid-base deficit: 1 mmol/L (ref 0.0–2.0)
Acid-base deficit: 4 mmol/L — ABNORMAL HIGH (ref 0.0–2.0)
Acid-base deficit: 2 mmol/L (ref 0.0–2.0)
Bicarbonate: 23.5 mmol/L (ref 20.0–28.0)
Bicarbonate: 20.8 mmol/L (ref 20.0–28.0)
Bicarbonate: 21.7 mmol/L (ref 20.0–28.0)
Bicarbonate: 26.7 mmol/L (ref 20.0–28.0)
Bicarbonate: 18.4 mmol/L — ABNORMAL LOW (ref 20.0–28.0)
Bicarbonate: 22.2 mmol/L (ref 20.0–28.0)
Bicarbonate: 21 mmol/L (ref 20.0–28.0)
Bicarbonate: 22.7 mmol/L (ref 20.0–28.0)
Bicarbonate: 23.7 mmol/L (ref 20.0–28.0)
Bicarbonate: 21.8 mmol/L (ref 20.0–28.0)
Bicarbonate: 24.7 mmol/L (ref 20.0–28.0)
Bicarbonate: 23.6 mmol/L (ref 20.0–28.0)
Bicarbonate: 25.2 mmol/L (ref 20.0–28.0)
Calcium, Ion: 1.19 mmol/L (ref 1.15–1.40)
Calcium, Ion: 1.14 mmol/L — ABNORMAL LOW (ref 1.15–1.40)
Calcium, Ion: 0.82 mmol/L — CL (ref 1.15–1.40)
Calcium, Ion: 1.05 mmol/L — ABNORMAL LOW (ref 1.15–1.40)
Calcium, Ion: 0.96 mmol/L — ABNORMAL LOW (ref 1.15–1.40)
Calcium, Ion: 1.25 mmol/L (ref 1.15–1.40)
Calcium, Ion: 0.98 mmol/L — ABNORMAL LOW (ref 1.15–1.40)
Calcium, Ion: 1.02 mmol/L — ABNORMAL LOW (ref 1.15–1.40)
Calcium, Ion: 1.04 mmol/L — ABNORMAL LOW (ref 1.15–1.40)
Calcium, Ion: 0.87 mmol/L — CL (ref 1.15–1.40)
Calcium, Ion: 1.01 mmol/L — ABNORMAL LOW (ref 1.15–1.40)
Calcium, Ion: 1.13 mmol/L — ABNORMAL LOW (ref 1.15–1.40)
Calcium, Ion: 1.14 mmol/L — ABNORMAL LOW (ref 1.15–1.40)
HCT: 23 % — ABNORMAL LOW (ref 39.0–52.0)
HCT: 19 % — ABNORMAL LOW (ref 39.0–52.0)
HCT: 20 % — ABNORMAL LOW (ref 39.0–52.0)
HCT: 23 % — ABNORMAL LOW (ref 39.0–52.0)
HCT: 23 % — ABNORMAL LOW (ref 39.0–52.0)
HCT: 29 % — ABNORMAL LOW (ref 39.0–52.0)
HCT: 23 % — ABNORMAL LOW (ref 39.0–52.0)
HCT: 24 % — ABNORMAL LOW (ref 39.0–52.0)
HCT: 26 % — ABNORMAL LOW (ref 39.0–52.0)
HCT: 20 % — ABNORMAL LOW (ref 39.0–52.0)
HCT: 22 % — ABNORMAL LOW (ref 39.0–52.0)
HCT: 23 % — ABNORMAL LOW (ref 39.0–52.0)
HCT: 23 % — ABNORMAL LOW (ref 39.0–52.0)
Hemoglobin: 7.8 g/dL — ABNORMAL LOW (ref 13.0–17.0)
Hemoglobin: 6.5 g/dL — CL (ref 13.0–17.0)
Hemoglobin: 6.8 g/dL — CL (ref 13.0–17.0)
Hemoglobin: 7.8 g/dL — ABNORMAL LOW (ref 13.0–17.0)
Hemoglobin: 7.8 g/dL — ABNORMAL LOW (ref 13.0–17.0)
Hemoglobin: 9.9 g/dL — ABNORMAL LOW (ref 13.0–17.0)
Hemoglobin: 7.8 g/dL — ABNORMAL LOW (ref 13.0–17.0)
Hemoglobin: 8.2 g/dL — ABNORMAL LOW (ref 13.0–17.0)
Hemoglobin: 8.8 g/dL — ABNORMAL LOW (ref 13.0–17.0)
Hemoglobin: 6.8 g/dL — CL (ref 13.0–17.0)
Hemoglobin: 7.5 g/dL — ABNORMAL LOW (ref 13.0–17.0)
Hemoglobin: 7.8 g/dL — ABNORMAL LOW (ref 13.0–17.0)
Hemoglobin: 7.8 g/dL — ABNORMAL LOW (ref 13.0–17.0)
O2 Saturation: 90 %
O2 Saturation: 99 %
O2 Saturation: 100 %
O2 Saturation: 100 %
O2 Saturation: 100 %
O2 Saturation: 53 %
O2 Saturation: 100 %
O2 Saturation: 100 %
O2 Saturation: 100 %
O2 Saturation: 100 %
O2 Saturation: 100 %
O2 Saturation: 100 %
O2 Saturation: 100 %
Patient temperature: 36.4
Potassium: 4.4 mmol/L (ref 3.5–5.1)
Potassium: 4.6 mmol/L (ref 3.5–5.1)
Potassium: 5.8 mmol/L — ABNORMAL HIGH (ref 3.5–5.1)
Potassium: 5.3 mmol/L — ABNORMAL HIGH (ref 3.5–5.1)
Potassium: 4.9 mmol/L (ref 3.5–5.1)
Potassium: 5.6 mmol/L — ABNORMAL HIGH (ref 3.5–5.1)
Potassium: 4.5 mmol/L (ref 3.5–5.1)
Potassium: 4.5 mmol/L (ref 3.5–5.1)
Potassium: 4.5 mmol/L (ref 3.5–5.1)
Potassium: 4.2 mmol/L (ref 3.5–5.1)
Potassium: 4.8 mmol/L (ref 3.5–5.1)
Potassium: 5.2 mmol/L — ABNORMAL HIGH (ref 3.5–5.1)
Potassium: 5.6 mmol/L — ABNORMAL HIGH (ref 3.5–5.1)
Sodium: 138 mmol/L (ref 135–145)
Sodium: 137 mmol/L (ref 135–145)
Sodium: 134 mmol/L — ABNORMAL LOW (ref 135–145)
Sodium: 136 mmol/L (ref 135–145)
Sodium: 140 mmol/L (ref 135–145)
Sodium: 142 mmol/L (ref 135–145)
Sodium: 144 mmol/L (ref 135–145)
Sodium: 144 mmol/L (ref 135–145)
Sodium: 145 mmol/L (ref 135–145)
Sodium: 145 mmol/L (ref 135–145)
Sodium: 147 mmol/L — ABNORMAL HIGH (ref 135–145)
Sodium: 142 mmol/L (ref 135–145)
Sodium: 144 mmol/L (ref 135–145)
TCO2: 25 mmol/L (ref 22–32)
TCO2: 22 mmol/L (ref 22–32)
TCO2: 23 mmol/L (ref 22–32)
TCO2: 28 mmol/L (ref 22–32)
TCO2: 20 mmol/L — ABNORMAL LOW (ref 22–32)
TCO2: 23 mmol/L (ref 22–32)
TCO2: 22 mmol/L (ref 22–32)
TCO2: 24 mmol/L (ref 22–32)
TCO2: 25 mmol/L (ref 22–32)
TCO2: 23 mmol/L (ref 22–32)
TCO2: 26 mmol/L (ref 22–32)
TCO2: 25 mmol/L (ref 22–32)
TCO2: 26 mmol/L (ref 22–32)
pCO2 arterial: 47.5 mmHg (ref 32–48)
pCO2 arterial: 50.4 mmHg — ABNORMAL HIGH (ref 32–48)
pCO2 arterial: 43.8 mmHg (ref 32–48)
pCO2 arterial: 47.2 mmHg (ref 32–48)
pCO2 arterial: 36.4 mmHg (ref 32–48)
pCO2 arterial: 64 mmHg — ABNORMAL HIGH (ref 32–48)
pCO2 arterial: 35.2 mmHg (ref 32–48)
pCO2 arterial: 38.4 mmHg (ref 32–48)
pCO2 arterial: 44.3 mmHg (ref 32–48)
pCO2 arterial: 41.6 mmHg (ref 32–48)
pCO2 arterial: 44.5 mmHg (ref 32–48)
pCO2 arterial: 38.8 mmHg (ref 32–48)
pCO2 arterial: 42.4 mmHg (ref 32–48)
pH, Arterial: 7.302 — ABNORMAL LOW (ref 7.35–7.45)
pH, Arterial: 7.224 — ABNORMAL LOW (ref 7.35–7.45)
pH, Arterial: 7.303 — ABNORMAL LOW (ref 7.35–7.45)
pH, Arterial: 7.36 (ref 7.35–7.45)
pH, Arterial: 7.067 — CL (ref 7.35–7.45)
pH, Arterial: 7.393 (ref 7.35–7.45)
pH, Arterial: 7.318 — ABNORMAL LOW (ref 7.35–7.45)
pH, Arterial: 7.383 (ref 7.35–7.45)
pH, Arterial: 7.398 (ref 7.35–7.45)
pH, Arterial: 7.328 — ABNORMAL LOW (ref 7.35–7.45)
pH, Arterial: 7.353 (ref 7.35–7.45)
pH, Arterial: 7.354 (ref 7.35–7.45)
pH, Arterial: 7.418 (ref 7.35–7.45)
pO2, Arterial: 65 mmHg — ABNORMAL LOW (ref 83–108)
pO2, Arterial: 166 mmHg — ABNORMAL HIGH (ref 83–108)
pO2, Arterial: 390 mmHg — ABNORMAL HIGH (ref 83–108)
pO2, Arterial: 349 mmHg — ABNORMAL HIGH (ref 83–108)
pO2, Arterial: 40 mmHg — CL (ref 83–108)
pO2, Arterial: 416 mmHg — ABNORMAL HIGH (ref 83–108)
pO2, Arterial: 392 mmHg — ABNORMAL HIGH (ref 83–108)
pO2, Arterial: 417 mmHg — ABNORMAL HIGH (ref 83–108)
pO2, Arterial: 453 mmHg — ABNORMAL HIGH (ref 83–108)
pO2, Arterial: 446 mmHg — ABNORMAL HIGH (ref 83–108)
pO2, Arterial: 497 mmHg — ABNORMAL HIGH (ref 83–108)
pO2, Arterial: 396 mmHg — ABNORMAL HIGH (ref 83–108)
pO2, Arterial: 440 mmHg — ABNORMAL HIGH (ref 83–108)

## 2023-05-20 LAB — HEMOGLOBIN AND HEMATOCRIT, BLOOD
HCT: 22.6 % — ABNORMAL LOW (ref 39.0–52.0)
Hemoglobin: 7.7 g/dL — ABNORMAL LOW (ref 13.0–17.0)

## 2023-05-20 LAB — POCT I-STAT, CHEM 8
BUN: 32 mg/dL — ABNORMAL HIGH (ref 8–23)
BUN: 30 mg/dL — ABNORMAL HIGH (ref 8–23)
BUN: 31 mg/dL — ABNORMAL HIGH (ref 8–23)
BUN: 35 mg/dL — ABNORMAL HIGH (ref 8–23)
BUN: 36 mg/dL — ABNORMAL HIGH (ref 8–23)
BUN: 38 mg/dL — ABNORMAL HIGH (ref 8–23)
BUN: 39 mg/dL — ABNORMAL HIGH (ref 8–23)
BUN: 40 mg/dL — ABNORMAL HIGH (ref 8–23)
BUN: 40 mg/dL — ABNORMAL HIGH (ref 8–23)
BUN: 38 mg/dL — ABNORMAL HIGH (ref 8–23)
BUN: 34 mg/dL — ABNORMAL HIGH (ref 8–23)
BUN: 34 mg/dL — ABNORMAL HIGH (ref 8–23)
BUN: 30 mg/dL — ABNORMAL HIGH (ref 8–23)
Calcium, Ion: 1.21 mmol/L (ref 1.15–1.40)
Calcium, Ion: 1.08 mmol/L — ABNORMAL LOW (ref 1.15–1.40)
Calcium, Ion: 0.86 mmol/L — CL (ref 1.15–1.40)
Calcium, Ion: 0.89 mmol/L — CL (ref 1.15–1.40)
Calcium, Ion: 1.05 mmol/L — ABNORMAL LOW (ref 1.15–1.40)
Calcium, Ion: 1.01 mmol/L — ABNORMAL LOW (ref 1.15–1.40)
Calcium, Ion: 1.01 mmol/L — ABNORMAL LOW (ref 1.15–1.40)
Calcium, Ion: 1.01 mmol/L — ABNORMAL LOW (ref 1.15–1.40)
Calcium, Ion: 0.92 mmol/L — ABNORMAL LOW (ref 1.15–1.40)
Calcium, Ion: 1.05 mmol/L — ABNORMAL LOW (ref 1.15–1.40)
Calcium, Ion: 0.88 mmol/L — CL (ref 1.15–1.40)
Calcium, Ion: 1.01 mmol/L — ABNORMAL LOW (ref 1.15–1.40)
Calcium, Ion: 1.15 mmol/L (ref 1.15–1.40)
Chloride: 101 mmol/L (ref 98–111)
Chloride: 105 mmol/L (ref 98–111)
Chloride: 99 mmol/L (ref 98–111)
Chloride: 99 mmol/L (ref 98–111)
Chloride: 101 mmol/L (ref 98–111)
Chloride: 101 mmol/L (ref 98–111)
Chloride: 102 mmol/L (ref 98–111)
Chloride: 103 mmol/L (ref 98–111)
Chloride: 103 mmol/L (ref 98–111)
Chloride: 107 mmol/L (ref 98–111)
Chloride: 106 mmol/L (ref 98–111)
Chloride: 107 mmol/L (ref 98–111)
Chloride: 108 mmol/L (ref 98–111)
Creatinine, Ser: 1.9 mg/dL — ABNORMAL HIGH (ref 0.61–1.24)
Creatinine, Ser: 1.8 mg/dL — ABNORMAL HIGH (ref 0.61–1.24)
Creatinine, Ser: 1.7 mg/dL — ABNORMAL HIGH (ref 0.61–1.24)
Creatinine, Ser: 1.8 mg/dL — ABNORMAL HIGH (ref 0.61–1.24)
Creatinine, Ser: 1.8 mg/dL — ABNORMAL HIGH (ref 0.61–1.24)
Creatinine, Ser: 1.8 mg/dL — ABNORMAL HIGH (ref 0.61–1.24)
Creatinine, Ser: 1.9 mg/dL — ABNORMAL HIGH (ref 0.61–1.24)
Creatinine, Ser: 1.9 mg/dL — ABNORMAL HIGH (ref 0.61–1.24)
Creatinine, Ser: 1.9 mg/dL — ABNORMAL HIGH (ref 0.61–1.24)
Creatinine, Ser: 1.9 mg/dL — ABNORMAL HIGH (ref 0.61–1.24)
Creatinine, Ser: 1.6 mg/dL — ABNORMAL HIGH (ref 0.61–1.24)
Creatinine, Ser: 1.8 mg/dL — ABNORMAL HIGH (ref 0.61–1.24)
Creatinine, Ser: 1.7 mg/dL — ABNORMAL HIGH (ref 0.61–1.24)
Glucose, Bld: 115 mg/dL — ABNORMAL HIGH (ref 70–99)
Glucose, Bld: 205 mg/dL — ABNORMAL HIGH (ref 70–99)
Glucose, Bld: 230 mg/dL — ABNORMAL HIGH (ref 70–99)
Glucose, Bld: 230 mg/dL — ABNORMAL HIGH (ref 70–99)
Glucose, Bld: 186 mg/dL — ABNORMAL HIGH (ref 70–99)
Glucose, Bld: 144 mg/dL — ABNORMAL HIGH (ref 70–99)
Glucose, Bld: 116 mg/dL — ABNORMAL HIGH (ref 70–99)
Glucose, Bld: 100 mg/dL — ABNORMAL HIGH (ref 70–99)
Glucose, Bld: 178 mg/dL — ABNORMAL HIGH (ref 70–99)
Glucose, Bld: 147 mg/dL — ABNORMAL HIGH (ref 70–99)
Glucose, Bld: 73 mg/dL (ref 70–99)
Glucose, Bld: 89 mg/dL (ref 70–99)
Glucose, Bld: 91 mg/dL (ref 70–99)
HCT: 24 % — ABNORMAL LOW (ref 39.0–52.0)
HCT: 18 % — ABNORMAL LOW (ref 39.0–52.0)
HCT: 23 % — ABNORMAL LOW (ref 39.0–52.0)
HCT: 24 % — ABNORMAL LOW (ref 39.0–52.0)
HCT: 21 % — ABNORMAL LOW (ref 39.0–52.0)
HCT: 26 % — ABNORMAL LOW (ref 39.0–52.0)
HCT: 24 % — ABNORMAL LOW (ref 39.0–52.0)
HCT: 23 % — ABNORMAL LOW (ref 39.0–52.0)
HCT: 25 % — ABNORMAL LOW (ref 39.0–52.0)
HCT: 27 % — ABNORMAL LOW (ref 39.0–52.0)
HCT: 20 % — ABNORMAL LOW (ref 39.0–52.0)
HCT: 22 % — ABNORMAL LOW (ref 39.0–52.0)
HCT: 22 % — ABNORMAL LOW (ref 39.0–52.0)
Hemoglobin: 8.2 g/dL — ABNORMAL LOW (ref 13.0–17.0)
Hemoglobin: 6.1 g/dL — CL (ref 13.0–17.0)
Hemoglobin: 7.8 g/dL — ABNORMAL LOW (ref 13.0–17.0)
Hemoglobin: 8.2 g/dL — ABNORMAL LOW (ref 13.0–17.0)
Hemoglobin: 7.1 g/dL — ABNORMAL LOW (ref 13.0–17.0)
Hemoglobin: 8.8 g/dL — ABNORMAL LOW (ref 13.0–17.0)
Hemoglobin: 8.2 g/dL — ABNORMAL LOW (ref 13.0–17.0)
Hemoglobin: 7.8 g/dL — ABNORMAL LOW (ref 13.0–17.0)
Hemoglobin: 8.5 g/dL — ABNORMAL LOW (ref 13.0–17.0)
Hemoglobin: 9.2 g/dL — ABNORMAL LOW (ref 13.0–17.0)
Hemoglobin: 6.8 g/dL — CL (ref 13.0–17.0)
Hemoglobin: 7.5 g/dL — ABNORMAL LOW (ref 13.0–17.0)
Hemoglobin: 7.5 g/dL — ABNORMAL LOW (ref 13.0–17.0)
Potassium: 4.4 mmol/L (ref 3.5–5.1)
Potassium: 4.8 mmol/L (ref 3.5–5.1)
Potassium: 5.9 mmol/L — ABNORMAL HIGH (ref 3.5–5.1)
Potassium: 5.9 mmol/L — ABNORMAL HIGH (ref 3.5–5.1)
Potassium: 5.4 mmol/L — ABNORMAL HIGH (ref 3.5–5.1)
Potassium: 4.9 mmol/L (ref 3.5–5.1)
Potassium: 4.5 mmol/L (ref 3.5–5.1)
Potassium: 5.3 mmol/L — ABNORMAL HIGH (ref 3.5–5.1)
Potassium: 5.6 mmol/L — ABNORMAL HIGH (ref 3.5–5.1)
Potassium: 4.6 mmol/L (ref 3.5–5.1)
Potassium: 4.2 mmol/L (ref 3.5–5.1)
Potassium: 4.8 mmol/L (ref 3.5–5.1)
Potassium: 5.2 mmol/L — ABNORMAL HIGH (ref 3.5–5.1)
Sodium: 138 mmol/L (ref 135–145)
Sodium: 137 mmol/L (ref 135–145)
Sodium: 137 mmol/L (ref 135–145)
Sodium: 138 mmol/L (ref 135–145)
Sodium: 137 mmol/L (ref 135–145)
Sodium: 139 mmol/L (ref 135–145)
Sodium: 139 mmol/L (ref 135–145)
Sodium: 140 mmol/L (ref 135–145)
Sodium: 139 mmol/L (ref 135–145)
Sodium: 143 mmol/L (ref 135–145)
Sodium: 145 mmol/L (ref 135–145)
Sodium: 146 mmol/L — ABNORMAL HIGH (ref 135–145)
Sodium: 144 mmol/L (ref 135–145)
TCO2: 23 mmol/L (ref 22–32)
TCO2: 22 mmol/L (ref 22–32)
TCO2: 26 mmol/L (ref 22–32)
TCO2: 31 mmol/L (ref 22–32)
TCO2: 26 mmol/L (ref 22–32)
TCO2: 29 mmol/L (ref 22–32)
TCO2: 29 mmol/L (ref 22–32)
TCO2: 27 mmol/L (ref 22–32)
TCO2: 25 mmol/L (ref 22–32)
TCO2: 22 mmol/L (ref 22–32)
TCO2: 25 mmol/L (ref 22–32)
TCO2: 28 mmol/L (ref 22–32)
TCO2: 26 mmol/L (ref 22–32)

## 2023-05-20 LAB — PROTIME-INR
INR: 3.3 — ABNORMAL HIGH (ref 0.8–1.2)
INR: 1.5 — ABNORMAL HIGH (ref 0.8–1.2)
Prothrombin Time: 33.6 s — ABNORMAL HIGH (ref 11.4–15.2)
Prothrombin Time: 18.7 s — ABNORMAL HIGH (ref 11.4–15.2)

## 2023-05-20 LAB — FIBRINOGEN
Fibrinogen: 219 mg/dL (ref 210–475)
Fibrinogen: 171 mg/dL — ABNORMAL LOW (ref 210–475)
Fibrinogen: 178 mg/dL — ABNORMAL LOW (ref 210–475)

## 2023-05-20 LAB — POCT I-STAT EG7
Acid-Base Excess: 2 mmol/L (ref 0.0–2.0)
Acid-base deficit: 5 mmol/L — ABNORMAL HIGH (ref 0.0–2.0)
Bicarbonate: 22.3 mmol/L (ref 20.0–28.0)
Bicarbonate: 26.3 mmol/L (ref 20.0–28.0)
Calcium, Ion: 0.89 mmol/L — CL (ref 1.15–1.40)
Calcium, Ion: 1.05 mmol/L — ABNORMAL LOW (ref 1.15–1.40)
HCT: 20 % — ABNORMAL LOW (ref 39.0–52.0)
HCT: 26 % — ABNORMAL LOW (ref 39.0–52.0)
Hemoglobin: 6.8 g/dL — CL (ref 13.0–17.0)
Hemoglobin: 8.8 g/dL — ABNORMAL LOW (ref 13.0–17.0)
O2 Saturation: 58 %
O2 Saturation: 90 %
Potassium: 5.7 mmol/L — ABNORMAL HIGH (ref 3.5–5.1)
Potassium: 4.3 mmol/L (ref 3.5–5.1)
Sodium: 136 mmol/L (ref 135–145)
Sodium: 141 mmol/L (ref 135–145)
TCO2: 24 mmol/L (ref 22–32)
TCO2: 28 mmol/L (ref 22–32)
pCO2, Ven: 51.8 mmHg (ref 44–60)
pCO2, Ven: 41.6 mmHg — ABNORMAL LOW (ref 44–60)
pH, Ven: 7.241 — ABNORMAL LOW (ref 7.25–7.43)
pH, Ven: 7.409 (ref 7.25–7.43)
pO2, Ven: 36 mmHg (ref 32–45)
pO2, Ven: 58 mmHg — ABNORMAL HIGH (ref 32–45)

## 2023-05-20 LAB — RENAL FUNCTION PANEL
Albumin: 3 g/dL — ABNORMAL LOW (ref 3.5–5.0)
Anion gap: 8 (ref 5–15)
BUN: 32 mg/dL — ABNORMAL HIGH (ref 8–23)
CO2: 22 mmol/L (ref 22–32)
Calcium: 8.3 mg/dL — ABNORMAL LOW (ref 8.9–10.3)
Chloride: 104 mmol/L (ref 98–111)
Creatinine, Ser: 1.8 mg/dL — ABNORMAL HIGH (ref 0.61–1.24)
GFR, Estimated: 42 mL/min — ABNORMAL LOW (ref >60)
Glucose, Bld: 97 mg/dL (ref 70–99)
Phosphorus: 2.7 mg/dL (ref 2.5–4.6)
Potassium: 4.2 mmol/L (ref 3.5–5.1)
Sodium: 134 mmol/L — ABNORMAL LOW (ref 135–145)

## 2023-05-20 LAB — APTT
aPTT: 40 s — ABNORMAL HIGH (ref 24–36)
aPTT: 67 s — ABNORMAL HIGH (ref 24–36)
aPTT: 68 s — ABNORMAL HIGH (ref 24–36)

## 2023-05-20 LAB — BASIC METABOLIC PANEL
Anion gap: 13 (ref 5–15)
BUN: 32 mg/dL — ABNORMAL HIGH (ref 8–23)
CO2: 21 mmol/L — ABNORMAL LOW (ref 22–32)
Calcium: 8.1 mg/dL — ABNORMAL LOW (ref 8.9–10.3)
Chloride: 110 mmol/L (ref 98–111)
Creatinine, Ser: 1.99 mg/dL — ABNORMAL HIGH (ref 0.61–1.24)
GFR, Estimated: 37 mL/min — ABNORMAL LOW (ref >60)
Glucose, Bld: 112 mg/dL — ABNORMAL HIGH (ref 70–99)
Potassium: 5.5 mmol/L — ABNORMAL HIGH (ref 3.5–5.1)
Sodium: 144 mmol/L (ref 135–145)

## 2023-05-20 LAB — PLATELET COUNT
Platelets: 52 10*3/uL — ABNORMAL LOW (ref 150–400)
Platelets: 102 10*3/uL — ABNORMAL LOW (ref 150–400)

## 2023-05-20 LAB — PHOSPHORUS: Phosphorus: 6.1 mg/dL — ABNORMAL HIGH (ref 2.5–4.6)

## 2023-05-20 LAB — GLUCOSE, CAPILLARY
Glucose-Capillary: 112 mg/dL — ABNORMAL HIGH (ref 70–99)
Glucose-Capillary: 102 mg/dL — ABNORMAL HIGH (ref 70–99)
Glucose-Capillary: 101 mg/dL — ABNORMAL HIGH (ref 70–99)
Glucose-Capillary: 169 mg/dL — ABNORMAL HIGH (ref 70–99)

## 2023-05-20 LAB — MAGNESIUM
Magnesium: 2.3 mg/dL (ref 1.7–2.4)
Magnesium: 2.4 mg/dL (ref 1.7–2.4)

## 2023-05-20 LAB — ALBUMIN: Albumin: <1.5 g/dL — ABNORMAL LOW (ref 3.5–5.0)

## 2023-05-20 LAB — ERYTHROPOIETIN: Erythropoietin: 1463 m[IU]/mL — ABNORMAL HIGH (ref 2.6–18.5)

## 2023-05-20 LAB — PREPARE RBC (CROSSMATCH)

## 2023-05-20 SURGERY — REDO AORTIC VALVE REPLACEMENT (AVR)
Anesthesia: General | Site: Chest

## 2023-05-20 MED ORDER — METHYLENE BLUE (ANTIDOTE) 1 % IV SOLN
0.5000 mg/kg/h | INTRAVENOUS | Status: DC
  Filled 2023-05-20: qty 25

## 2023-05-20 MED ORDER — PROPOFOL 10 MG/ML IV BOLUS
INTRAVENOUS | Status: AC
  Filled 2023-05-20: qty 20

## 2023-05-20 MED ORDER — SODIUM BICARBONATE 8.4 % IV SOLN
INTRAVENOUS | Status: DC | PRN
Start: 2023-05-20 — End: 2023-05-20
  Administered 2023-05-20: 50 meq via INTRAVENOUS

## 2023-05-20 MED ORDER — LACTATED RINGERS IV SOLN: INTRAVENOUS | Status: DC | PRN

## 2023-05-20 MED ORDER — POLYETHYLENE GLYCOL 3350 17 G PO PACK
17.0000 g | PACK | Freq: Every day | ORAL | Status: DC
  Administered 2023-05-21 – 2023-05-24 (×4): 17 g
  Filled 2023-05-20 (×4): qty 1

## 2023-05-20 MED ORDER — SODIUM CHLORIDE 0.9% FLUSH
3.0000 mL | Freq: Two times a day (BID) | INTRAVENOUS | Status: DC
  Administered 2023-05-21 – 2023-05-22 (×4): 3 mL via INTRAVENOUS
  Administered 2023-05-23: 5 mL via INTRAVENOUS
  Administered 2023-05-23 – 2023-06-11 (×28): 3 mL via INTRAVENOUS

## 2023-05-20 MED ORDER — THROMBIN 20000 UNITS EX SOLR
OROMUCOSAL | Status: DC | PRN
  Administered 2023-05-20 (×2): 3 mL via TOPICAL

## 2023-05-20 MED ORDER — HEMOSTATIC AGENTS (NO CHARGE) OPTIME
TOPICAL | Status: DC | PRN
Start: 2023-05-20 — End: 2023-05-20
  Administered 2023-05-20: 1 via TOPICAL

## 2023-05-20 MED ORDER — SODIUM CHLORIDE 0.45 % IV SOLN: INTRAVENOUS | Status: DC | PRN

## 2023-05-20 MED ORDER — MAGNESIUM SULFATE 4 GM/100ML IV SOLN: 4.0000 g | Freq: Once | INTRAVENOUS | Status: DC

## 2023-05-20 MED ORDER — VASOPRESSIN 20 UNIT/ML IV SOLN
INTRAVENOUS | Status: DC | PRN
Start: 2023-05-20 — End: 2023-05-20
  Administered 2023-05-20 (×3): 2 [IU] via INTRAVENOUS
  Administered 2023-05-20: 4 [IU] via INTRAVENOUS

## 2023-05-20 MED ORDER — DEXMEDETOMIDINE HCL IN NACL 400 MCG/100ML IV SOLN
0.0000 ug/kg/h | INTRAVENOUS | Status: DC
  Administered 2023-05-21: 0.2 ug/kg/h via INTRAVENOUS
  Administered 2023-05-21 – 2023-05-24 (×9): 0.5 ug/kg/h via INTRAVENOUS
  Administered 2023-05-25 – 2023-05-28 (×10): 0.7 ug/kg/h via INTRAVENOUS
  Administered 2023-05-28: 0.07 ug/kg/h via INTRAVENOUS
  Administered 2023-05-28 – 2023-06-01 (×18): 0.7 ug/kg/h via INTRAVENOUS
  Administered 2023-06-02 (×2): 0.5 ug/kg/h via INTRAVENOUS
  Administered 2023-06-02: 0.7 ug/kg/h via INTRAVENOUS
  Administered 2023-06-03: 0.5 ug/kg/h via INTRAVENOUS
  Filled 2023-05-20 (×2): qty 100
  Filled 2023-05-20: qty 200
  Filled 2023-05-20 (×41): qty 100

## 2023-05-20 MED ORDER — SODIUM CHLORIDE 0.9 % IV SOLN: 250.0000 mL | INTRAVENOUS | Status: DC

## 2023-05-20 MED ORDER — CHLORHEXIDINE GLUCONATE 0.12 % MT SOLN
15.0000 mL | OROMUCOSAL | Status: AC
  Administered 2023-05-21: 15 mL via OROMUCOSAL
  Filled 2023-05-20: qty 15

## 2023-05-20 MED ORDER — DOCUSATE SODIUM 50 MG/5ML PO LIQD
100.0000 mg | Freq: Two times a day (BID) | ORAL | Status: DC
  Administered 2023-05-21 – 2023-06-01 (×16): 100 mg
  Filled 2023-05-20 (×17): qty 10

## 2023-05-20 MED ORDER — SODIUM CHLORIDE 0.9 % IV SOLN: 10.0000 mL/h | Freq: Once | INTRAVENOUS | Status: DC

## 2023-05-20 MED ORDER — ASPIRIN 81 MG PO CHEW: 324.0000 mg | CHEWABLE_TABLET | Freq: Once | ORAL | Status: DC

## 2023-05-20 MED ORDER — DEXTROSE 50 % IV SOLN: 0.0000 mL | INTRAVENOUS | Status: DC | PRN

## 2023-05-20 MED ORDER — ACETAMINOPHEN 160 MG/5ML PO SOLN: 650.0000 mg | Freq: Once | ORAL | Status: DC

## 2023-05-20 MED ORDER — ACETAMINOPHEN 500 MG PO TABS: 1000.0000 mg | ORAL_TABLET | Freq: Four times a day (QID) | ORAL | Status: AC

## 2023-05-20 MED ORDER — METOPROLOL TARTRATE 25 MG/10 ML ORAL SUSPENSION: 12.5000 mg | Freq: Two times a day (BID) | ORAL | Status: DC

## 2023-05-20 MED ORDER — NOREPINEPHRINE 4 MG/250ML-% IV SOLN
0.0000 ug/min | INTRAVENOUS | Status: DC
  Administered 2023-05-21: 1 ug/min via INTRAVENOUS
  Administered 2023-05-22 – 2023-05-24 (×3): 2 ug/min via INTRAVENOUS
  Administered 2023-05-24: 5 ug/min via INTRAVENOUS
  Administered 2023-05-25: 11 ug/min via INTRAVENOUS
  Filled 2023-05-20 (×6): qty 250

## 2023-05-20 MED ORDER — INSULIN REGULAR(HUMAN) IN NACL 100-0.9 UT/100ML-% IV SOLN
INTRAVENOUS | Status: DC
  Administered 2023-05-21: 2.1 [IU]/h via INTRAVENOUS

## 2023-05-20 MED ORDER — PANTOPRAZOLE SODIUM 40 MG PO TBEC: 40.0000 mg | DELAYED_RELEASE_TABLET | Freq: Every day | ORAL | Status: DC

## 2023-05-20 MED ORDER — EPINEPHRINE 1 MG/10ML IJ SOSY
PREFILLED_SYRINGE | INTRAMUSCULAR | Status: AC
  Filled 2023-05-20: qty 10

## 2023-05-20 MED ORDER — BISACODYL 5 MG PO TBEC
10.0000 mg | DELAYED_RELEASE_TABLET | Freq: Every day | ORAL | Status: DC
  Administered 2023-05-21 – 2023-06-01 (×4): 10 mg via ORAL
  Filled 2023-05-20 (×4): qty 2

## 2023-05-20 MED ORDER — CEFAZOLIN SODIUM 1 G IJ SOLR
INTRAMUSCULAR | Status: AC
  Filled 2023-05-20: qty 20

## 2023-05-20 MED ORDER — POTASSIUM CHLORIDE 10 MEQ/50ML IV SOLN: 10.0000 meq | INTRAVENOUS | Status: AC

## 2023-05-20 MED ORDER — 0.9 % SODIUM CHLORIDE (POUR BTL) OPTIME
TOPICAL | Status: DC | PRN
Start: 2023-05-20 — End: 2023-05-20
  Administered 2023-05-20: 5000 mL

## 2023-05-20 MED ORDER — METHYLENE BLUE (ANTIDOTE) 1 % IV SOLN
2.0000 mg/kg | Freq: Once | Status: AC
  Administered 2023-05-20: 84.8 mg via INTRAVENOUS
  Filled 2023-05-20: qty 17

## 2023-05-20 MED ORDER — OXYCODONE HCL 5 MG PO TABS: 5.0000 mg | ORAL_TABLET | ORAL | Status: DC | PRN

## 2023-05-20 MED ORDER — LACTATED RINGERS IV SOLN: INTRAVENOUS | Status: DC

## 2023-05-20 MED ORDER — VASOPRESSIN 20 UNIT/ML IV SOLN
INTRAVENOUS | Status: AC
  Filled 2023-05-20: qty 1

## 2023-05-20 MED ORDER — PROPOFOL 500 MG/50ML IV EMUL
INTRAVENOUS | Status: DC | PRN
Start: 2023-05-20 — End: 2023-05-20
  Administered 2023-05-20: 25 ug/kg/min via INTRAVENOUS

## 2023-05-20 MED ORDER — THROMBIN 20000 UNITS EX SOLR
CUTANEOUS | Status: DC | PRN
  Administered 2023-05-20: 20000 [IU] via TOPICAL

## 2023-05-20 MED ORDER — MIDAZOLAM BOLUS VIA INFUSION
0.0000 mg | INTRAVENOUS | Status: DC | PRN
  Administered 2023-05-31 – 2023-06-01 (×2): 1 mg via INTRAVENOUS

## 2023-05-20 MED ORDER — ASPIRIN 81 MG PO CHEW
324.0000 mg | CHEWABLE_TABLET | Freq: Every day | ORAL | Status: DC
  Administered 2023-05-21 – 2023-05-22 (×2): 324 mg
  Filled 2023-05-20 (×2): qty 4

## 2023-05-20 MED ORDER — GLYCOPYRROLATE PF 0.2 MG/ML IJ SOSY
PREFILLED_SYRINGE | INTRAMUSCULAR | Status: AC
  Filled 2023-05-20: qty 2

## 2023-05-20 MED ORDER — FENTANYL CITRATE (PF) 250 MCG/5ML IJ SOLN
INTRAMUSCULAR | Status: AC
  Filled 2023-05-20: qty 5

## 2023-05-20 MED ORDER — SODIUM BICARBONATE 8.4 % IV SOLN
INTRAVENOUS | Status: AC
  Filled 2023-05-20: qty 50

## 2023-05-20 MED ORDER — COAGULATION FACTOR VIIA RECOMB 1 MG IV SOLR
45.0000 ug/kg | Freq: Once | INTRAVENOUS | Status: AC
  Administered 2023-05-20: 4000 ug via INTRAVENOUS
  Filled 2023-05-20: qty 4

## 2023-05-20 MED ORDER — ALBUMIN HUMAN 5 % IV SOLN
250.0000 mL | INTRAVENOUS | Status: AC | PRN
  Administered 2023-05-21 (×5): 12.5 g via INTRAVENOUS
  Filled 2023-05-20 (×4): qty 250

## 2023-05-20 MED ORDER — MILRINONE LACTATE IN DEXTROSE 20-5 MG/100ML-% IV SOLN: 0.3000 ug/kg/min | INTRAVENOUS | Status: DC

## 2023-05-20 MED ORDER — SODIUM CHLORIDE 0.9% IV SOLUTION: Freq: Once | INTRAVENOUS | Status: AC

## 2023-05-20 MED ORDER — SODIUM CHLORIDE 0.9 % IV SOLN: 0.3000 ug/kg | Freq: Once | INTRAVENOUS | Status: DC

## 2023-05-20 MED ORDER — MIDAZOLAM HCL 2 MG/2ML IJ SOLN
INTRAMUSCULAR | Status: AC
  Filled 2023-05-20: qty 2

## 2023-05-20 MED ORDER — ACETAMINOPHEN 160 MG/5ML PO SOLN
1000.0000 mg | Freq: Four times a day (QID) | ORAL | Status: AC
  Administered 2023-05-21 – 2023-05-25 (×14): 1000 mg
  Filled 2023-05-20 (×14): qty 40.6

## 2023-05-20 MED ORDER — ASPIRIN 325 MG PO TBEC: 325.0000 mg | DELAYED_RELEASE_TABLET | Freq: Every day | ORAL | Status: DC

## 2023-05-20 MED ORDER — SODIUM CHLORIDE 0.9% FLUSH: 3.0000 mL | INTRAVENOUS | Status: DC | PRN

## 2023-05-20 MED ORDER — VANCOMYCIN HCL IN DEXTROSE 1-5 GM/200ML-% IV SOLN: 1000.0000 mg | Freq: Once | INTRAVENOUS | Status: DC

## 2023-05-20 MED ORDER — FENTANYL 2500MCG IN NS 250ML (10MCG/ML) PREMIX INFUSION
50.0000 ug/h | INTRAVENOUS | Status: DC
  Administered 2023-05-20: 100 ug/h via INTRAVENOUS
  Administered 2023-05-21 – 2023-05-25 (×8): 200 ug/h via INTRAVENOUS
  Filled 2023-05-20 (×9): qty 250

## 2023-05-20 MED ORDER — MIDAZOLAM HCL (PF) 10 MG/2ML IJ SOLN
INTRAMUSCULAR | Status: AC
  Filled 2023-05-20: qty 2

## 2023-05-20 MED ORDER — EPINEPHRINE HCL 5 MG/250ML IV SOLN IN NS
2.0000 ug/min | INTRAVENOUS | Status: DC
  Administered 2023-05-21 – 2023-05-25 (×7): 5 ug/min via INTRAVENOUS
  Administered 2023-05-26 (×2): 9 ug/min via INTRAVENOUS
  Administered 2023-05-26: 10 ug/min via INTRAVENOUS
  Administered 2023-05-27: 8 ug/min via INTRAVENOUS
  Administered 2023-05-27: 9 ug/min via INTRAVENOUS
  Administered 2023-05-28 (×2): 10 ug/min via INTRAVENOUS
  Administered 2023-05-28: 8 ug/min via INTRAVENOUS
  Administered 2023-05-29: 10 ug/min via INTRAVENOUS
  Administered 2023-05-29: 9 ug/min via INTRAVENOUS
  Administered 2023-05-30: 10 ug/min via INTRAVENOUS
  Administered 2023-05-30: 9 ug/min via INTRAVENOUS
  Administered 2023-05-30 – 2023-05-31 (×2): 10 ug/min via INTRAVENOUS
  Administered 2023-05-31: 12 ug/min via INTRAVENOUS
  Administered 2023-06-01: 5 ug/min via INTRAVENOUS
  Administered 2023-06-01: 9 ug/min via INTRAVENOUS
  Administered 2023-06-01: 10 ug/min via INTRAVENOUS
  Administered 2023-06-02: 6 ug/min via INTRAVENOUS
  Administered 2023-06-02: 9 ug/min via INTRAVENOUS
  Administered 2023-06-03: 5 ug/min via INTRAVENOUS
  Administered 2023-06-04: 3 ug/min via INTRAVENOUS
  Administered 2023-06-05: 5 ug/min via INTRAVENOUS
  Administered 2023-06-06: 10 ug/min via INTRAVENOUS
  Administered 2023-06-06: 6 ug/min via INTRAVENOUS
  Administered 2023-06-07: 11 ug/min via INTRAVENOUS
  Filled 2023-05-20 (×34): qty 250

## 2023-05-20 MED ORDER — PLASMA-LYTE A IV SOLN
INTRAVENOUS | Status: DC | PRN
  Administered 2023-05-20: 500 mL

## 2023-05-20 MED ORDER — SODIUM CHLORIDE 0.9 % IV SOLN
20.0000 ug | Freq: Once | INTRAVENOUS | Status: AC
  Administered 2023-05-20: 20 ug via INTRAVENOUS
  Filled 2023-05-20: qty 5

## 2023-05-20 MED ORDER — PROTAMINE SULFATE 10 MG/ML IV SOLN
INTRAVENOUS | Status: AC
  Filled 2023-05-20: qty 50

## 2023-05-20 MED ORDER — VASOPRESSIN 20 UNITS/100 ML INFUSION FOR SHOCK
0.0000 [IU]/min | INTRAVENOUS | Status: DC
  Administered 2023-05-21: 0.03 [IU]/min via INTRAVENOUS
  Administered 2023-05-21: 0.02 [IU]/min via INTRAVENOUS
  Administered 2023-05-22 – 2023-05-26 (×10): 0.03 [IU]/min via INTRAVENOUS
  Filled 2023-05-20 (×13): qty 100

## 2023-05-20 MED ORDER — PROTAMINE SULFATE 10 MG/ML IV SOLN
INTRAVENOUS | Status: DC | PRN
  Administered 2023-05-20: 50 mg via INTRAVENOUS
  Administered 2023-05-20: 250 mg via INTRAVENOUS
  Administered 2023-05-20: 50 mg via INTRAVENOUS
  Administered 2023-05-20: 250 mg via INTRAVENOUS
  Administered 2023-05-20: 50 mg via INTRAVENOUS

## 2023-05-20 MED ORDER — TRANEXAMIC ACID 1000 MG/10ML IV SOLN
INTRAVENOUS | Status: DC | PRN
  Administered 2023-05-20: 1.5 mg/kg/h via INTRAVENOUS

## 2023-05-20 MED ORDER — MIDAZOLAM HCL 2 MG/2ML IJ SOLN
2.0000 mg | INTRAMUSCULAR | Status: DC | PRN
  Administered 2023-06-10 – 2023-06-12 (×5): 2 mg via INTRAVENOUS
  Filled 2023-05-20 (×5): qty 2

## 2023-05-20 MED ORDER — LIDOCAINE 2% (20 MG/ML) 5 ML SYRINGE
INTRAMUSCULAR | Status: AC
  Filled 2023-05-20: qty 5

## 2023-05-20 MED ORDER — CALCIUM CHLORIDE 10 % IV SOLN
INTRAVENOUS | Status: DC | PRN
Start: 2023-05-20 — End: 2023-05-20
  Administered 2023-05-20 (×15): 200 mg via INTRAVENOUS

## 2023-05-20 MED ORDER — TRAMADOL HCL 50 MG PO TABS: 50.0000 mg | ORAL_TABLET | ORAL | Status: DC | PRN

## 2023-05-20 MED ORDER — PANTOPRAZOLE SODIUM 40 MG IV SOLR
40.0000 mg | Freq: Every day | INTRAVENOUS | Status: DC
  Administered 2023-05-21: 40 mg via INTRAVENOUS
  Filled 2023-05-20: qty 10

## 2023-05-20 MED ORDER — PROPOFOL 10 MG/ML IV BOLUS
INTRAVENOUS | Status: DC | PRN
  Administered 2023-05-20: 50 mg via INTRAVENOUS

## 2023-05-20 MED ORDER — METOPROLOL TARTRATE 12.5 MG HALF TABLET: 12.5000 mg | ORAL_TABLET | Freq: Two times a day (BID) | ORAL | Status: DC

## 2023-05-20 MED ORDER — MORPHINE SULFATE (PF) 2 MG/ML IV SOLN
1.0000 mg | INTRAVENOUS | Status: DC | PRN
  Filled 2023-05-20: qty 1

## 2023-05-20 MED ORDER — BISACODYL 10 MG RE SUPP
10.0000 mg | Freq: Every day | RECTAL | Status: DC
  Administered 2023-05-22 – 2023-06-11 (×7): 10 mg via RECTAL
  Filled 2023-05-20 (×7): qty 1

## 2023-05-20 MED ORDER — METHYLPREDNISOLONE SODIUM SUCC 125 MG IJ SOLR
INTRAMUSCULAR | Status: DC | PRN
Start: 2023-05-20 — End: 2023-05-20
  Administered 2023-05-20: 125 mg via INTRAVENOUS

## 2023-05-20 MED ORDER — THROMBIN (RECOMBINANT) 20000 UNITS EX SOLR
CUTANEOUS | Status: AC
  Filled 2023-05-20: qty 20000

## 2023-05-20 MED ORDER — SODIUM CHLORIDE 0.9 % IV SOLN: INTRAVENOUS | Status: DC

## 2023-05-20 MED ORDER — HEPARIN SODIUM (PORCINE) 1000 UNIT/ML IJ SOLN
INTRAMUSCULAR | Status: AC
  Filled 2023-05-20: qty 2

## 2023-05-20 MED ORDER — DOCUSATE SODIUM 100 MG PO CAPS: 200.0000 mg | ORAL_CAPSULE | Freq: Every day | ORAL | Status: DC

## 2023-05-20 MED ORDER — METOPROLOL TARTRATE 5 MG/5ML IV SOLN: 2.5000 mg | INTRAVENOUS | Status: DC | PRN

## 2023-05-20 MED ORDER — CEFAZOLIN SODIUM-DEXTROSE 2-4 GM/100ML-% IV SOLN: 2.0000 g | Freq: Three times a day (TID) | INTRAVENOUS | Status: DC

## 2023-05-20 MED ORDER — ROCURONIUM BROMIDE 10 MG/ML (PF) SYRINGE
PREFILLED_SYRINGE | INTRAVENOUS | Status: AC
  Filled 2023-05-20: qty 30

## 2023-05-20 MED ORDER — ONDANSETRON HCL 4 MG/2ML IJ SOLN: 4.0000 mg | Freq: Four times a day (QID) | INTRAMUSCULAR | Status: DC | PRN

## 2023-05-20 MED ORDER — ALBUMIN HUMAN 5 % IV SOLN
INTRAVENOUS | Status: AC
  Administered 2023-05-21: 12.5 g
  Filled 2023-05-20: qty 750

## 2023-05-20 MED ORDER — CALCIUM CHLORIDE 10 % IV SOLN
INTRAVENOUS | Status: AC
  Filled 2023-05-20: qty 20

## 2023-05-20 MED ORDER — FENTANYL CITRATE PF 50 MCG/ML IJ SOSY: 50.0000 ug | PREFILLED_SYRINGE | Freq: Once | INTRAMUSCULAR | Status: DC

## 2023-05-20 MED ORDER — HEPARIN SODIUM (PORCINE) 1000 UNIT/ML IJ SOLN
INTRAMUSCULAR | Status: DC | PRN
  Administered 2023-05-20 (×2): 30000 [IU] via INTRAVENOUS

## 2023-05-20 MED ORDER — METHYLPREDNISOLONE SODIUM SUCC 125 MG IJ SOLR
INTRAMUSCULAR | Status: AC
  Filled 2023-05-20: qty 2

## 2023-05-20 MED ORDER — FENTANYL CITRATE (PF) 250 MCG/5ML IJ SOLN
INTRAMUSCULAR | Status: DC | PRN
  Administered 2023-05-20 (×2): 50 ug via INTRAVENOUS

## 2023-05-20 MED ORDER — SODIUM CHLORIDE 0.9 % IV SOLN: INTRAVENOUS | Status: DC | PRN

## 2023-05-20 MED ORDER — HEMOSTATIC AGENTS (NO CHARGE) OPTIME
TOPICAL | Status: DC | PRN
  Administered 2023-05-20: 1 via TOPICAL

## 2023-05-20 MED ORDER — METOCLOPRAMIDE HCL 5 MG/ML IJ SOLN
10.0000 mg | Freq: Four times a day (QID) | INTRAMUSCULAR | Status: AC
  Administered 2023-05-21 – 2023-05-22 (×5): 10 mg via INTRAVENOUS
  Filled 2023-05-20 (×5): qty 2

## 2023-05-20 MED ORDER — MIDAZOLAM-SODIUM CHLORIDE 100-0.9 MG/100ML-% IV SOLN
0.5000 mg/h | INTRAVENOUS | Status: DC
  Administered 2023-05-20: 4 mg/h via INTRAVENOUS
  Administered 2023-05-21 – 2023-05-24 (×10): 10 mg/h via INTRAVENOUS
  Administered 2023-05-25 (×2): 12 mg/h via INTRAVENOUS
  Administered 2023-05-25: 10 mg/h via INTRAVENOUS
  Administered 2023-05-26 – 2023-05-29 (×10): 11 mg/h via INTRAVENOUS
  Administered 2023-05-30: 7 mg/h via INTRAVENOUS
  Administered 2023-05-30: 11 mg/h via INTRAVENOUS
  Administered 2023-05-31: 6 mg/h via INTRAVENOUS
  Administered 2023-06-01: 4 mg/h via INTRAVENOUS
  Administered 2023-06-01: 6 mg/h via INTRAVENOUS
  Administered 2023-06-01: 2 mg/h via INTRAVENOUS
  Filled 2023-05-20 (×31): qty 100

## 2023-05-20 MED ORDER — ROCURONIUM BROMIDE 10 MG/ML (PF) SYRINGE
PREFILLED_SYRINGE | INTRAVENOUS | Status: DC | PRN
  Administered 2023-05-20: 50 mg via INTRAVENOUS
  Administered 2023-05-20: 100 mg via INTRAVENOUS
  Administered 2023-05-20: 50 mg via INTRAVENOUS

## 2023-05-20 MED ORDER — VASOPRESSIN 20 UNITS/100 ML INFUSION FOR SHOCK
INTRAVENOUS | Status: DC | PRN
Start: 2023-05-20 — End: 2023-05-20
  Administered 2023-05-20: .04 [IU]/min via INTRAVENOUS

## 2023-05-20 MED ORDER — FENTANYL BOLUS VIA INFUSION: 50.0000 ug | INTRAVENOUS | Status: DC | PRN

## 2023-05-20 MED ORDER — MIDAZOLAM HCL (PF) 5 MG/ML IJ SOLN
INTRAMUSCULAR | Status: DC | PRN
  Administered 2023-05-20 (×2): 2 mg via INTRAVENOUS

## 2023-05-20 SURGICAL SUPPLY — 150 items
ADAPTER CARDIO PERF ANTE/RETRO (ADAPTER) ×3 IMPLANT
ADPR PRFSN 84XANTGRD RTRGD (ADAPTER) ×2
ANCHOR CATH FOLEY SECURE (MISCELLANEOUS) IMPLANT
ANTIFOG SOL W/FOAM PAD STRL (MISCELLANEOUS) ×2
ATTRACTOMAT 16X20 MAGNETIC DRP (DRAPES) ×3 IMPLANT
BAG DECANTER FOR FLEXI CONT (MISCELLANEOUS) ×3 IMPLANT
BANDAGE ESMARK 6X9 LF (GAUZE/BANDAGES/DRESSINGS) IMPLANT
BLADE CORE FAN STRYKER (BLADE) ×3 IMPLANT
BLADE MIC 41X13 (BLADE) ×3 IMPLANT
BLADE SURG 15 STRL LF DISP TIS (BLADE) ×3 IMPLANT
BLADE SURG 15 STRL SS (BLADE) ×2
BLOOD HAEMOCONCENTR 700 MIDI (MISCELLANEOUS) IMPLANT
BNDG CMPR 5X4 KNIT ELC UNQ LF (GAUZE/BANDAGES/DRESSINGS) ×2
BNDG CMPR 6 X 5 YARDS HK CLSR (GAUZE/BANDAGES/DRESSINGS) ×2
BNDG CMPR 9X6 STRL LF SNTH (GAUZE/BANDAGES/DRESSINGS) ×2
BNDG ELASTIC 4INX 5YD STR LF (GAUZE/BANDAGES/DRESSINGS) IMPLANT
BNDG ELASTIC 6INX 5YD STR LF (GAUZE/BANDAGES/DRESSINGS) IMPLANT
BNDG ESMARK 6X9 LF (GAUZE/BANDAGES/DRESSINGS) ×2
BNDG GAUZE DERMACEA FLUFF 4 (GAUZE/BANDAGES/DRESSINGS) IMPLANT
BNDG GZE DERMACEA 4 6PLY (GAUZE/BANDAGES/DRESSINGS) ×2
CANISTER SUCT 3000ML PPV (MISCELLANEOUS) ×3 IMPLANT
CANN PRFSN 3/8X14X24FR PCFC (MISCELLANEOUS)
CANN PRFSN 3/8XCNCT ST RT ANG (MISCELLANEOUS)
CANNULA GUNDRY RCSP 15FR (MISCELLANEOUS) ×3 IMPLANT
CANNULA GUNDRY RETROGRADE 15FR (MISCELLANEOUS) IMPLANT
CANNULA PRFSN 3/8X14X24FR PCFC (MISCELLANEOUS) IMPLANT
CANNULA PRFSN 3/8XCNCT RT ANG (MISCELLANEOUS) IMPLANT
CANNULA RT ANGLE VENOUS 34FR (CANNULA) IMPLANT
CANNULA SUMP PERICARDIAL (CANNULA) ×3 IMPLANT
CANNULA VEN MTL TIP RT (MISCELLANEOUS)
CANNULA VRC MALB SNGL STG 34FR (MISCELLANEOUS) IMPLANT
CANNULAE RT ANGLE VENOUS 34FR (CANNULA) ×2
CATH HEART VENT LEFT (CATHETERS) ×3 IMPLANT
CATH ROBINSON RED A/P 18FR (CATHETERS) ×9 IMPLANT
CATH THORACIC 36FR (CATHETERS) ×3 IMPLANT
CATH THORACIC 36FR RT ANG (CATHETERS) ×3 IMPLANT
CAUTERY HI TEMP FINE TIP 2 (MISCELLANEOUS) IMPLANT
CNTNR URN SCR LID CUP LEK RST (MISCELLANEOUS) ×3 IMPLANT
CONN 1/2X1/2X1/2 BEN (MISCELLANEOUS) ×3 IMPLANT
CONN ST 3/8 X 1/2 (MISCELLANEOUS) ×6 IMPLANT
CONNECTOR STRAIGHT 3/8 (MISCELLANEOUS) IMPLANT
CONT SPEC 4OZ STRL OR WHT (MISCELLANEOUS) ×8
CONTAINER PROTECT SURGISLUSH (MISCELLANEOUS) ×6 IMPLANT
COUNTER NDL 20CT MAGNET RED (NEEDLE) IMPLANT
COVER CAMERA OR STL DISP (MISCELLANEOUS) IMPLANT
COVER SURGICAL LIGHT HANDLE (MISCELLANEOUS) ×6 IMPLANT
DEVICE SUT CK QUICK LOAD MINI (Prosthesis & Implant Heart) IMPLANT
DRAPE CARDIOVASCULAR INCISE (DRAPES) ×2
DRAPE INCISE IOBAN 66X45 STRL (DRAPES) IMPLANT
DRAPE SRG 135X102X78XABS (DRAPES) ×3 IMPLANT
DRAPE WARM FLUID 44X44 (DRAPES) ×3 IMPLANT
DRSG COVADERM 4X14 (GAUZE/BANDAGES/DRESSINGS) ×3 IMPLANT
ELECT BLADE 4.0 EZ CLEAN MEGAD (MISCELLANEOUS) ×2
ELECT CAUTERY BLADE 6.4 (BLADE) ×3 IMPLANT
ELECT REM PT RETURN 9FT ADLT (ELECTROSURGICAL) ×4
ELECTRODE BLDE 4.0 EZ CLN MEGD (MISCELLANEOUS) IMPLANT
ELECTRODE REM PT RTRN 9FT ADLT (ELECTROSURGICAL) ×6 IMPLANT
FELT TEFLON 1X6 (MISCELLANEOUS) ×6 IMPLANT
GAUZE 4X4 16PLY ~~LOC~~+RFID DBL (SPONGE) ×3 IMPLANT
GAUZE SPONGE 4X4 12PLY STRL (GAUZE/BANDAGES/DRESSINGS) ×6 IMPLANT
GLOVE BIO SURGEON STRL SZ 6 (GLOVE) IMPLANT
GLOVE BIO SURGEON STRL SZ 6.5 (GLOVE) IMPLANT
GLOVE BIO SURGEON STRL SZ7 (GLOVE) IMPLANT
GLOVE BIO SURGEON STRL SZ7.5 (GLOVE) IMPLANT
GLOVE BIOGEL PI IND STRL 7.0 (GLOVE) IMPLANT
GLOVE SS BIOGEL STRL SZ 6 (GLOVE) IMPLANT
GLOVE SURG MICRO LTX SZ7 (GLOVE) ×6 IMPLANT
GOWN STRL REUS W/ TWL LRG LVL3 (GOWN DISPOSABLE) ×12 IMPLANT
GOWN STRL REUS W/ TWL XL LVL3 (GOWN DISPOSABLE) ×3 IMPLANT
GOWN STRL REUS W/TWL LRG LVL3 (GOWN DISPOSABLE) ×10
GOWN STRL REUS W/TWL XL LVL3 (GOWN DISPOSABLE) ×2
GRAFT VALVE AORTIC CRYO 25 (Prosthesis & Implant Heart) IMPLANT
HEMOSTAT POWDER SURGIFOAM 1G (HEMOSTASIS) ×9 IMPLANT
HEMOSTAT SURGICEL 2X14 (HEMOSTASIS) ×3 IMPLANT
INSERT FOGARTY XLG (MISCELLANEOUS) IMPLANT
KIT BASIN OR (CUSTOM PROCEDURE TRAY) ×3 IMPLANT
KIT CATH CPB BARTLE (MISCELLANEOUS) ×3 IMPLANT
KIT SUCTION CATH 14FR (SUCTIONS) ×3 IMPLANT
KIT SUT CK MINI COMBO 4X17 (Prosthesis & Implant Heart) IMPLANT
KIT TOURNIQUET VASCULAR (KITS) IMPLANT
KIT TURNOVER KIT B (KITS) ×3 IMPLANT
KIT VASOVIEW HEMOPRO 2 VH 4000 (KITS) IMPLANT
KNIFE MICRO-UNI 3.5 30 DEG (BLADE) IMPLANT
LINE VENT (MISCELLANEOUS) IMPLANT
LOOP VASCLR EXTRA MAXI WHITE (MISCELLANEOUS) ×3 IMPLANT
LOOP VASCULAR SMAXI 22 WHT (MISCELLANEOUS) ×2
LOOPS VASCLR EXTRA MAXI WHITE (MISCELLANEOUS) ×2 IMPLANT
NS IRRIG 1000ML POUR BTL (IV SOLUTION) ×15 IMPLANT
PACK E OPEN HEART (SUTURE) ×3 IMPLANT
PACK OPEN HEART (CUSTOM PROCEDURE TRAY) ×3 IMPLANT
PAD ARMBOARD 7.5X6 YLW CONV (MISCELLANEOUS) ×6 IMPLANT
PAD ELECT DEFIB RADIOL ZOLL (MISCELLANEOUS) ×3 IMPLANT
POSITIONER HEAD DONUT 9IN (MISCELLANEOUS) ×3 IMPLANT
PUNCH AORTIC ROTATE 4.5MM 8IN (MISCELLANEOUS) IMPLANT
PUNCH AORTIC ROTATE 5MM 8IN (MISCELLANEOUS) IMPLANT
RING TRICUSPID T34 (Prosthesis & Implant Heart) IMPLANT
SEALANT SURG COSEAL 8ML (VASCULAR PRODUCTS) IMPLANT
SET MPS 3-ND DEL (MISCELLANEOUS) IMPLANT
SOLUTION ANTFG W/FOAM PAD STRL (MISCELLANEOUS) IMPLANT
SPONGE T-LAP 18X18 ~~LOC~~+RFID (SPONGE) ×12 IMPLANT
SUT EB EXC GRN/WHT 2-0 D/A SH (SUTURE)
SUT EB EXC GRN/WHT 2-0 V-5 (SUTURE) ×6 IMPLANT
SUT ETHIBON EXCEL 2-0 V-5 (SUTURE) IMPLANT
SUT ETHIBOND (SUTURE) IMPLANT
SUT ETHIBOND 2 0 V4 (SUTURE) IMPLANT
SUT ETHIBOND 2 0V4 GREEN (SUTURE) IMPLANT
SUT ETHIBOND 2-0 RB-1 WHT (SUTURE) IMPLANT
SUT ETHIBOND 4 0 RB 1 (SUTURE) IMPLANT
SUT ETHIBOND V-5 VALVE (SUTURE) IMPLANT
SUT ETHILON 3 0 PS 1 (SUTURE) IMPLANT
SUT MNCRL AB 4-0 PS2 18 (SUTURE) IMPLANT
SUT PROLENE 2 0 MH 48 (SUTURE) IMPLANT
SUT PROLENE 3 0 SH 1 (SUTURE) ×3 IMPLANT
SUT PROLENE 3 0 SH 48 (SUTURE) ×3 IMPLANT
SUT PROLENE 3 0 SH DA (SUTURE) IMPLANT
SUT PROLENE 3 0 SH1 36 (SUTURE) ×3 IMPLANT
SUT PROLENE 4 0 RB 1 (SUTURE) ×20
SUT PROLENE 4 0 SH DA (SUTURE) IMPLANT
SUT PROLENE 4-0 RB1 .5 CRCL 36 (SUTURE) ×12 IMPLANT
SUT PROLENE 5 0 C 1 36 (SUTURE) IMPLANT
SUT PROLENE 5 0 RB 2 (SUTURE) ×6 IMPLANT
SUT PROLENE 7 0 BV 1 (SUTURE) IMPLANT
SUT SILK 1 MH (SUTURE) IMPLANT
SUT SILK 1 TIES 10X30 (SUTURE) IMPLANT
SUT SILK 2 0 SH CR/8 (SUTURE) IMPLANT
SUT SILK 4 0 SH CR/8 (SUTURE) IMPLANT
SUT STEEL 6MS V (SUTURE) IMPLANT
SUT STEEL STERNAL CCS#1 18IN (SUTURE) IMPLANT
SUT STEEL SZ 6 DBL 3X14 BALL (SUTURE) IMPLANT
SUT VIC AB 1 CTX 27 (SUTURE) ×6 IMPLANT
SUT VIC AB 1 CTX 36 (SUTURE) ×4
SUT VIC AB 1 CTX36XBRD ANBCTR (SUTURE) ×6 IMPLANT
SUT VIC AB 2-0 CT1 27 (SUTURE) ×2
SUT VIC AB 2-0 CT1 TAPERPNT 27 (SUTURE) IMPLANT
SUT VIC AB 3-0 X1 27 (SUTURE) IMPLANT
SUTURE EB EXC GRN/WHT 2-0 D/A (SUTURE) IMPLANT
SYSTEM SAHARA CHEST DRAIN ATS (WOUND CARE) ×3 IMPLANT
TAPE CLOTH SURG 4X10 WHT LF (GAUZE/BANDAGES/DRESSINGS) IMPLANT
TAPE PAPER 2X10 WHT MICROPORE (GAUZE/BANDAGES/DRESSINGS) IMPLANT
TOWEL GREEN STERILE (TOWEL DISPOSABLE) ×3 IMPLANT
TOWEL GREEN STERILE FF (TOWEL DISPOSABLE) ×3 IMPLANT
TRAY FOLEY SLVR 16FR TEMP STAT (SET/KITS/TRAYS/PACK) ×3 IMPLANT
TUBE SUCTION CARDIAC 10FR (CANNULA) IMPLANT
TUBING LAP HI FLOW INSUFFLATIO (TUBING) IMPLANT
UNDERPAD 30X36 HEAVY ABSORB (UNDERPADS AND DIAPERS) ×3 IMPLANT
VALVE AORTIC CRYO 25MM (Prosthesis & Implant Heart) ×2 IMPLANT
VASCULAR TIE EXTRA MAXI WHITE (MISCELLANEOUS) ×2
VENT LEFT HEART 12002 (CATHETERS) ×2
VRC MALLEABLE SINGLE STG 34FR (MISCELLANEOUS) ×2
WATER STERILE IRR 1000ML POUR (IV SOLUTION) ×6 IMPLANT

## 2023-05-20 NOTE — Anesthesia Procedure Notes (Signed)
Arterial Line Insertion Start/End9/13/2024 7:00 AM, 05/20/2023 7:10 AM Performed by: Mariann Barter, MD, anesthesiologist  Patient location: Pre-op. Preanesthetic checklist: patient identified, IV checked, site marked, risks and benefits discussed, surgical consent, monitors and equipment checked, pre-op evaluation, timeout performed and anesthesia consent Left, brachial was placed Catheter size: 20 G Hand hygiene performed , maximum sterile barriers used  and Seldinger technique used  Attempts: 1 Procedure performed using ultrasound guided technique. Ultrasound Notes:anatomy identified, needle tip was noted to be adjacent to the nerve/plexus identified, no ultrasound evidence of intravascular and/or intraneural injection and image(s) printed for medical record Following insertion, Biopatch and dressing applied. Post procedure assessment: normal  Patient tolerated the procedure well with no immediate complications.

## 2023-05-20 NOTE — Brief Op Note (Signed)
05/17/2023 - 05/20/2023  7:13 AM  PATIENT:  [name]  62 y.o. male  PRE-OPERATIVE DIAGNOSIS:  Aortic root aneurysm, Prosthetic aortic valve dehiscence,  Moderate to severe paravalvular aortic insufficiency  POST-OPERATIVE DIAGNOSIS:  Aortic root aneurysm, Prosthetic aortic valve dehiscence, Moderate to severe paravalvular aortic insufficiency  PROCEDURE:  Procedure(s):  REDO STERNOTOMY  BENTALL PROCEDURE/REPLACEMENT OF ASCENDING AORTA -25 Homograft  -Reimplantation of Right and Left Coronary Artery  TRICUSPID VALVE REPAIR (N/A) -34 MC3 Ring  CANNULATION FOR VA ECMO  TRANSESOPHAGEAL ECHOCARDIOGRAM (N/A)  SURGEON:  Surgeons and Role:    * Alleen Borne, MD - Primary  PHYSICIAN ASSISTANT: Lowella Dandy PA-C  ASSISTANTS: Valene Bors, Kristie Cowman RNFA   ANESTHESIA:   general  EBL:  Per Anesthesia Record  BLOOD ADMINISTERED: 4 units CC PRBC,   CC CELLSAVER, 2 FFP, and 3 PLTS, 2 Cryo  DRAINS:  Mediastinal Chest Drains, Left Pleural Chest Tube    LOCAL MEDICATIONS USED:  NONE  SPECIMEN:  Source of Specimen:  Aortic Valve Prosthesis, Aortic Aneurysm  DISPOSITION OF SPECIMEN:  PATHOLOGY  COUNTS:  YES  TOURNIQUET:  * No tourniquets in log *  DICTATION: .Dragon Dictation  PLAN OF CARE: Admit to inpatient   PATIENT DISPOSITION:  ICU - intubated and in critical but stable   Delay start of Pharmacological VTE agent (>24hrs) due to surgical blood loss or risk of bleeding: yes

## 2023-05-20 NOTE — Transfer of Care (Signed)
Immediate Anesthesia Transfer of Care Note  Patient: Jaffer Dinsdale  Procedure(s) Performed: REDO AORTIC VALVE REPLACEMENT (AVR) USING A 25 MM CRYO AORTIC VALVE CONDUIT (Chest) ASCENDING AORTIC ROOT REPLACEMENT (Chest) TRICUSPID VALVE REPAIR USING A 34 MM MC3 ANNULOPLASTY RING (Chest) TRANSESOPHAGEAL ECHOCARDIOGRAM  Patient Location: ICU  Anesthesia Type:General  Level of Consciousness: sedated  Airway & Oxygen Therapy: Patient remains intubated per anesthesia plan and Patient placed on Ventilator (see vital sign flow sheet for setting)  Post-op Assessment: Report given to RN and Post -op Vital signs reviewed and stable  Post vital signs: stable  Last Vitals:  Vitals Value Taken Time  BP    Temp 36.4 C 05/20/23 2118  Pulse    Resp 16 05/20/23 2118  SpO2    Vitals shown include unfiled device data.  Last Pain:  Vitals:   05/20/23 0634  TempSrc:   PainSc: 10-Worst pain ever         Complications: No notable events documented.

## 2023-05-20 NOTE — Interval H&P Note (Signed)
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  The various methods of treatment have been discussed with the patient and family. After consideration of risks, benefits and other options for treatment, the patient has consented to  Procedure(s): REDO AORTIC VALVE REPLACEMENT (AVR) (N/A) ASCENDING AORTIC ROOT REPLACEMENT (N/A) possible, TRICUSPID VALVE REPAIR (N/A) TRANSESOPHAGEAL ECHOCARDIOGRAM (N/A) as a surgical intervention.  The patient's history has been reviewed, patient examined, no change in status, stable for surgery.  I have reviewed the patient's chart and labs.  Questions were answered to the patient's satisfaction.     Alleen Borne

## 2023-05-20 NOTE — Progress Notes (Signed)
Pt Hgb 7.3 this morning. PTT 40. Dr Ace Gins notified. No new orders at this time.

## 2023-05-20 NOTE — Progress Notes (Signed)
In OR since this morning for redo AVR, ascending aortic root replacement possible TV repair  Have yet to see   Will cont to follow for case completion and return to ICU for A&P    Tessie Fass MSN, AGACNP-BC [facility] Pulmonary/Critical Care Medicine 05/20/2023, 3:38 PM

## 2023-05-20 NOTE — Hospital Course (Addendum)
History of Present Illness:  [name] is a 89 you male with history of hypertension, hypercholesterolemia, type 2 diabetes, end-stage renal disease on hemodialysis until a couple weeks ago.  He has a known heart history undergoing coronary artery disease and bicuspid aortic valve with severe aortic insufficiency status post CABG x 4 and aortic valve replacement using a 23 mm Edwards magna-ease pericardial valve by Dr. Laneta Simmers on 11/25/2015.  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  He completed antibiotic treatment for this.  He presented to [facility] on 05/25/2023 with lower extremity edema and inability to lay flat.  Workup showed the patient to be anemic with a hemoglobin level of 6.0.  CXR was obtained and showed moderate pulmonary vascular congestion.  Echocardiogram was obtained and showed a preserved EF of 50-55% however there was concern for a partial dehiscence of the aortic valve annulus with a significant perivalvular leak which was new since his last Echo performed in May.  Due to this he was transferred to [facility] [place] for further care and evaluation.  TEE was obtained and confirmed the presence of a partial dehiscence of the aortic valve prosthesis with 2 perivalvular leaks adjacent to the left and right cusps.  There was moderate to severe aortic insufficiency.  There was also an echodensity in the subvalvular area of the tricuspid valve annulus with severe tricuspid regurgitation.  Cardiothoracic surgery consultation was requested and he was evaluated by Dr. Laneta Simmers who felt the patient would require surgical intervention, however this would be extremely high risk.  The risks and benefits of the procedure were explained to the patient and he was agreeable to proceed.  Hospital Course:  The patient was treated with CRRT to optimize volume status prior to going to the operating room.  He was started on broad spectrum antibiotics.  He was taken  to the operating room on 05/22/2023.  He underwent Redo Sternotomy, Biologic Bentall using a 28 mm Homograft with Re-implantation of right and left coronary artery, Tricuspid Valve Repair, and Endoscopic harvest of greater saphenous vein from his left leg. He was placed on [place] ECMO prior to leaving the operating room. He tolerated the procedure and was taken to the SICU stable condition. He was started on CRRT to remove volume as tolerated. He had expected postoperative blood loss anemia and thrombocytopenia, he was transfused with 1U of PRBCs and 1U of platelets. He was placed on Vancomycin and Ceftriaxone due to previous MSSA endocarditis. Aortic valve leaflet gram stain showed no WBC and cultures showed NGTD. Hemodynamics were stable on Epi 5, NE 1 and Vaso 0.03 on 09/15. ECMO flows were turned down. Pacer was set to VVI 50 since the patient was in sinus rhythm and later junctional. . Bivalirudin was started per pharmacy.  He underwent TEE which showed moderate LVH.  His EF was 30% with diffuse hypokinesis.  He had trivial AI, trivial TR, and trivial MR. He had thrombocytopenia post op. His platelet count went as low 20,000. Cortrak was placed and he received tube feedings. Patient was taken back to OR on 09/19 in order to undergo a mediastinal washout and insertion of left femoral a line. Also, VA ECMO was continued instead of converting to VV ECMO. Midodrine was added to help with labile BP. CXR looked worse (dense consolidation right lung) on 09/21. Pulmonary/CCM did a bronchoscopy. He has been on trickle tube feedings (abdomen distended). H and H was decreased to 7.9 and 24.5  so he received a unit of PRBCs.  Micofungin was added to antibiotic regimen. Thrombocytopenia worsened and his platelets on 09/23 were down to 14,000. He had no active signs of bleeding. He had severe malnutrition. Trickle tube feedings were stopped due to abdominal distention and he was started on TNA. Patient returned to the OR on 09/24  in order to de cannulated for  VA ECMO and convert  VV ECMO. He required an increase in his 3 vasopressors. He was coagulopathic and required PRBCs. He was given platelets and thrombocytopenia was improved as platelets were 66,000. WBC continued to slowly decrease. Broad spectrum antibiotics and antifungal were continued.  Despite this CT of chest continued to show extensive chest infiltrates.  BAL showed growth of candida and mold.  He was started on Amphotericin B.  Sedation was stopped and the patient became more responsive, He was intermittently following commands.  He was weaned off Levophed on 9/25 and Vasopressin 9/28.  His epinephrine was continued and he required additional support with resumption of Vasopressin overnight on 10/1.  He was unable to be weaned from ventilator and underwent tracheostomy placement on 10/1.  He developed increasing pressor support and elevated lactic acid.  His lactic acid improved without intervention.  He developed brief episodes of hemoptysis. Bronchoscopy showed an occlusive clot in the trachea with bleeding in the LUL.  He was treated with cryo, epinephrine, and TXA.  CXR has showed complete loss of lung fields due to bleeding and occlusive clots in airways.  On 07/02/2023 he again required rising pressor requirements.

## 2023-05-20 NOTE — Progress Notes (Addendum)
NAME:  [name], MRN:  [id number], DOB:  [birth year], LOS: 2 ADMISSION DATE:  05/22/2023, CONSULTATION DATE:  9/11 REFERRING MD:  Dr. Izora Ribas, CHIEF COMPLAINT:  aortic valve dehiscence   History of Present Illness:  Patient is [name] w/ pertinent PMH CAD s/p CABG 2017 w/ AVR (magna ease for bicuspid valve disease), HLD, HTN, prior CVA, T2DM, CKD4 was previously on HD presents to [facility] on 9/10 chest pain.  Patient recently admitted to [facility] on 5/25 w/ AKI and AMS. While in ED patient had PEA arrest w/ ROSC in about 8 minutes. Patient required dialysis on this admission. Also w/ MSSA bacteremia associated w/ tricuspid valve endocarditis. Patient transferred to [facility] on 5/25. Treated w/ 6 week course oxacillin and rifampin. This admission also suffered a R cerebellar CVA w/ MRI likely septic emboli and had cholangitis/pancreatitis requiring ERCP.  On 9/10 patient admitted to [facility] w/ chest pain, dyspnea, and BLE edema. BNP 2,097. CXR w/ pulm edema. Patient started on IV lasix infusion. Cards consulted. On 9/11 patient transferred to [facility]. Patient's echo showing possible aortic valve dehiscence. Cultures repeated and started on rocephin. Patient transferred to ICU for TEE and general anesthesia and to start CRRT. PCCM consulted.  Pertinent  Medical History   Past Medical History:  Diagnosis Date   Allergy    Anemia    Anxiety    Baker's cyst of knee    Blood transfusion without reported diagnosis    as baby    Coronary artery disease    quadruple bypass - March 2016   GERD (gastroesophageal reflux disease)    Gouty arthritis    "real bad" (01/17/2013)   Heart murmur    Hypercholesteremia    Hypertension    MSSA bacteremia 01/29/2023   Myocardial infarction (HCC) 2017   PEA (Pulseless electrical activity) (HCC) 01/29/2023   Stroke (HCC)    Type II diabetes mellitus (HCC)      Significant Hospital Events: Including procedures, antibiotic start and stop dates  in addition to other pertinent events   9/10 admitted 9/11 echo showing aortic valve dehiscence, confirmed by TEE.  9/11 started (back) on CRRT for volume overload and known CKD 4 9/12 breathing better after CRRT 9/13 to OR for Redo of aortic valve, tricuspid valve repair and ascending aortic root replacement w/ re-implantation of SVG to RCA. Post op TEE EF 55% RV mid-mod HK, AVR/TV repair stable. Could not come off CPB. Worse pulmonary edema. High dose pressors. Cannulated and started on VA ECMO w/ central cannulation   Interim History / Subjective:   Currently stable on VA ECMO Objective   Blood pressure 101/60, pulse 78, temperature 97.6 F (36.4 C), resp. rate (!) 24, height 5\' 6"  (1.676 m), weight 84.8 kg, SpO2 92%.        Intake/Output Summary (Last 24 hours) at 05/16/2023 2018 Last data filed at 05/21/2023 2006 Gross per 24 hour  Intake 4987.51 ml  Output 1314 ml  Net 3673.51 ml   Filed Weights   05/29/2023 2100 05/19/23 0626 05/29/2023 0545  Weight: 89.5 kg 88.2 kg 84.8 kg    Examination: General Critically ill 62 year old male sedated on vent HEENT normocephalic atraumatic orally intubated pupils equal reactive Pulmonary equal chest rise clear to auscultation postsurgical tubes intact Cardiac currently on VA ECMO, Abdomen soft Neuro heavily sedated Extremities with bilateral lower extremity dopplerable pulses GU minimal urine output   Assessment & Plan:   Post cardiotomy  vasoplegia w/ failure  to wean from CPB Acute on chronic combined HF  Plan VA ECMO per adv heart failure  Weaning NE, epi and vasopressin Tele   S/p AVR/Bentall and reimplantation of Coronaries and TV repair   h/o Post Prosthetic aortic valve dehiscence w/ mod-severe AVR due to MSSA bacteremia in 01/2023, S/P AVR at [facility] Plan cont rocephin and vanc day 2  Cont tele  Post-op per thoracic surgery   Acute respiratory failure w/ hypoxia due to volume overload, with untreated OSA Plan Cont full  vent support  PAD protocol  RASS goal -4  Am cxr F/u abg  CKD 4. Had recently been on HD Plan Resume CRRT  Serial chems   Acute on chronic anemia Thrombocytopenia Plan Serial cbcs Transfuse per HF team   L>R BLE swelling ->LE [place] neg for DVT Plan Observe  CAD s/p CABG in 2017 w/ AVR HTN HLD Plan See above   Hx of CVA Plan Resume statin and zetia when ok w surgical team   DMT2 Plan Ssi  Glucose goal 140-180   Best Practice (right click and "Reselect all SmartList Selections" daily)   Diet/type: Regular consistency (see orders) DVT prophylaxis: SCD and [place] heparin.  GI prophylaxis: N/A Lines: Central line and Dialysis Catheter Foley:  N/A Code Status:  full code Last date of multidisciplinary goals of care discussion [pending]  CRITICAL CARE 32 min  Performed by: Shelby Mattocks

## 2023-05-20 NOTE — Progress Notes (Addendum)
Brief nephrology note:  Patient is in OR since this morning.  I am unable to see him or examine him today.  I have reviewed his chart and lab results including treatment plan.  I agree with continuing CRRT prescription.  All 4K bath, UF as tolerated.  Please call us with any question. We will continue to follow.  Eddie North, MD [facility].

## 2023-05-20 NOTE — Anesthesia Preprocedure Evaluation (Addendum)
Anesthesia Evaluation  Patient identified by MRN, date of birth, ID band Patient awake    Reviewed: Allergy & Precautions, NPO status , Patient's Chart, lab work & pertinent test results, reviewed documented beta blocker date and time   History of Anesthesia Complications Negative for: history of anesthetic complications  Airway Mallampati: III  TM Distance: >3 FB Neck ROM: Full    Dental  (+) Missing,    Pulmonary sleep apnea , neg COPD, Patient abstained from smoking. Hypoxic respiratory failure on HFNC. Pleural effusions    + decreased breath sounds      Cardiovascular hypertension, + CAD, + Past MI and +CHF  (-) dysrhythmias + Valvular Problems/Murmurs  Rhythm:Regular Rate:Normal + Diastolic murmurs Dehiscence of bioprosthetic AVR with resultant severe AI   Neuro/Psych neg Seizures  Anxiety     TIACVA, No Residual Symptoms    GI/Hepatic ,GERD  ,,(+) neg Cirrhosis        Endo/Other  diabetes, Type 2    Renal/GU ARF and DialysisRenal disease     Musculoskeletal  (+) Arthritis ,    Abdominal   Peds  Hematology  (+) Blood dyscrasia, anemia   Anesthesia Other Findings   Reproductive/Obstetrics                             Anesthesia Physical Anesthesia Plan  ASA: 4  Anesthesia Plan: General   Post-op Pain Management:    Induction: Intravenous  PONV Risk Score and Plan: 2 and Propofol infusion and Ondansetron  Airway Management Planned: Oral ETT  Additional Equipment: Arterial line, CVP, PA Cath, TEE and Ultrasound Guidance Line Placement  Intra-op Plan:   Post-operative Plan: Post-operative intubation/ventilation  Informed Consent: I have reviewed the patients History and Physical, chart, labs and discussed the procedure including the risks, benefits and alternatives for the proposed anesthesia with the patient or authorized representative who has indicated his/her  understanding and acceptance.     Dental advisory given  Plan Discussed with:   Anesthesia Plan Comments:        Anesthesia Quick Evaluation

## 2023-05-20 NOTE — Anesthesia Procedure Notes (Signed)
Central Venous Catheter Insertion Performed by: Mariann Barter, MD, anesthesiologist Start/End9/13/2024 7:40 AM, 05/20/2023 7:50 AM Patient location: OR. Preanesthetic checklist: patient identified, IV checked, site marked, risks and benefits discussed, surgical consent, monitors and equipment checked, pre-op evaluation, timeout performed and anesthesia consent Position: Trendelenburg Patient sedated Hand hygiene performed , maximum sterile barriers used  and Seldinger technique used Catheter size: 8.5 Fr Central line was placed.MAC introducer Swan type:thermodilution Procedure performed using ultrasound guided technique. Ultrasound Notes:anatomy identified, needle tip was noted to be adjacent to the nerve/plexus identified, no ultrasound evidence of intravascular and/or intraneural injection and image(s) printed for medical record Attempts: 2 Following insertion, line sutured, dressing applied and Biopatch. Post procedure assessment: blood return through all ports, free fluid flow and no air  Post procedure complications: local hematoma. Patient tolerated the procedure well with no immediate complications. Additional procedure comments: Initial attempt on RIJ in holding room, met resistance with wire at approx 20cm (? Permcath), elected to discontinue attempts due to patient intolerance of flat positioning. Second attempt after induction of general anesthesia, easy needle placement and wire passage LIJ. Marland Kitchen

## 2023-05-20 NOTE — CV Procedure (Signed)
ECMO NOTE:   Indication: Post-cardiotomy shock with failure to wean from cardiopulmonary bypass   Initial cannulation date: 05/20/23   ECMO type: Central VA ECMO  1) RA central drainage cannula 2) Asc Ao return cannula   ECMO events:   - Initial cannulation 05/20/23    Daily data:   Flow 4.3L RPM 3250 pVen -37 dP 20 Sweep  2L   Labs:   ABG    Component Value Date/Time   PHART 7.354 05/20/2023 2039   PCO2ART 42.4 05/20/2023 2039   PO2ART 440 (H) 05/20/2023 2039   HCO3 23.6 05/20/2023 2039   TCO2 25 05/20/2023 2039   ACIDBASEDEF 2.0 05/20/2023 2039   O2SAT 100 05/20/2023 2039     Plan:  - Continue ECMO support. - Address coagulopathy - Assess for potential decannulation and chest closure on Monday  Arvilla Meres, MD  10:07 PM

## 2023-05-20 NOTE — Progress Notes (Signed)
05/20/2023 To be on centrally cannulated VA ECMO for post pump cardioplegia Keep heavily sedated and rest settings from my standpoint Will follow, appreciate TCTS/AHF/ECMO team.  Myrla Halsted MD PCCM

## 2023-05-20 NOTE — Progress Notes (Incomplete)
NAME:  [name], MRN:  [id number], DOB:  Jun 07, 1961, LOS: 2 ADMISSION DATE:  06/04/2023, CONSULTATION DATE:  9/11 REFERRING MD:  Dr. Izora Ribas, CHIEF COMPLAINT:  aortic valve dehiscence   History of Present Illness:  Patient is [name] w/ pertinent PMH CAD s/p CABG 2017 w/ AVR (magna ease for bicuspid valve disease), HLD, HTN, prior CVA, T2DM, CKD4 was previously on HD presents to [facility] on 9/10 chest pain.  Patient recently admitted to [facility] on 5/25 w/ AKI and AMS. While in ED patient had PEA arrest w/ ROSC in about 8 minutes. Patient required dialysis on this admission. Also w/ MSSA bacteremia associated w/ tricuspid valve endocarditis. Patient transferred to [facility] on 5/25. Treated w/ 6 week course oxacillin and rifampin. This admission also suffered a R cerebellar CVA w/ MRI likely septic emboli and had cholangitis/pancreatitis requiring ERCP.  On 9/10 patient admitted to [facility] w/ chest pain, dyspnea, and BLE edema. BNP 2,097. CXR w/ pulm edema. Patient started on IV lasix infusion. Cards consulted. On 9/11 patient transferred to [facility]. Patient's echo showing possible aortic valve dehiscence. Cultures repeated and started on rocephin. Patient transferred to ICU for TEE and general anesthesia and to start CRRT. PCCM consulted.  Pertinent  Medical History   Past Medical History:  Diagnosis Date   Allergy    Anemia    Anxiety    Baker's cyst of knee    Blood transfusion without reported diagnosis    as baby    Coronary artery disease    quadruple bypass - March 2016   GERD (gastroesophageal reflux disease)    Gouty arthritis    "real bad" (01/17/2013)   Heart murmur    Hypercholesteremia    Hypertension    MSSA bacteremia 01/29/2023   Myocardial infarction (HCC) 2017   PEA (Pulseless electrical activity) (HCC) 01/29/2023   Stroke (HCC)    Type II diabetes mellitus (HCC)      Significant Hospital Events: Including procedures, antibiotic start and stop dates  in addition to other pertinent events   9/10 admitted 9/11 echo showing aortic valve dehiscence, confirmed by TEE.  9/11 started (back) on CRRT for volume overload and known CKD 4 9/13 OR for redo sternotomy, AVR bentall , TV repair   Interim History / Subjective:   POD0  Redo sternotomy Bentall / replacement of ascending aorta TV repair  VA ECMO cannulation   Objective   Blood pressure 101/60, pulse 78, temperature 97.6 F (36.4 C), resp. rate (!) 24, height 5\' 6"  (1.676 m), weight 84.8 kg, SpO2 92%.        Intake/Output Summary (Last 24 hours) at 05/24/2023 1650 Last data filed at 05/24/2023 1228 Gross per 24 hour  Intake 2394.51 ml  Output 2329 ml  Net 65.51 ml   Filed Weights   05/10/2023 2100 05/19/23 0626 05/19/2023 0545  Weight: 89.5 kg 88.2 kg 84.8 kg    Examination: General:  HEENT:  Neuro:  CV:  PULM:   GI:  Extremities: Skin:   Resolved     Assessment & Plan:   Prosthetic AV dehiscence, now  s/p bentall/replacement of ascending aorta, TV repair (9/13 w  bartle) Hx MSSA bacteremia, TV endocarditis   ABLA, expected Vasoplegia  -case 9/13 sig for vasoplegia pre-bypass somewhat refractory to pressors, req MB.   P -cont abx -l/t/wires per cvts -post op labs -complete txa -insulin gtt  -***   Acute resp failure with hypoxia OSA Volume overload Endotracheally intubated  P -CXR abg after case -VAP, pulm hygiene -Volume off w CRRT  CAD s/p CABG in 2017 w/ AVR AoC combined HF  HTN HLD  -statin, zetia   CKD IV -CRRT per nephro   Hx of CVA -statin and zetia  DMT2 -post op insulin gtt   Best Practice (right click and "Reselect all SmartList Selections" daily)   Diet/type: Regular consistency (see orders) DVT prophylaxis: SCD and [place] heparin.  GI prophylaxis: N/A Lines: Central line and Dialysis Catheter Foley:  N/A Code Status:  full code Last date of multidisciplinary goals of care discussion [pending]  CRITICAL CARE Performed  by: Lanier Clam   CRITICAL CARE Performed by: Lanier Clam   Total critical care time: *** minutes  Critical care time was exclusive of separately billable procedures and treating other patients.  Critical care was necessary to treat or prevent imminent or life-threatening deterioration.  Critical care was time spent personally by me on the following activities: development of treatment plan with patient and/or surrogate as well as nursing, discussions with consultants, evaluation of patient's response to treatment, examination of patient, obtaining history from patient or surrogate, ordering and performing treatments and interventions, ordering and review of laboratory studies, ordering and review of radiographic studies, pulse oximetry and re-evaluation of patient's condition.  Tessie Fass MSN, AGACNP-BC [facility] Pulmonary/Critical Care Medicine Amion for pager 2023-06-19, 5:08 PM

## 2023-05-20 NOTE — Anesthesia Procedure Notes (Signed)
Procedure Name: Intubation Date/Time: 05/20/2023 7:30 AM  Performed by: Mariann Barter, MDPre-anesthesia Checklist: Patient identified, Emergency Drugs available, Suction available and Patient being monitored Patient Re-evaluated:Patient Re-evaluated prior to induction Oxygen Delivery Method: Circle system utilized Preoxygenation: Pre-oxygenation with 100% oxygen Induction Type: IV induction Ventilation: Mask ventilation without difficulty Laryngoscope Size: Mac and 4 Grade View: Grade I Tube type: Oral Tube size: 8.0 mm Number of attempts: 1 Airway Equipment and Method: Oral airway Placement Confirmation: ETT inserted through vocal cords under direct vision, positive ETCO2 and breath sounds checked- equal and bilateral Tube secured with: Tape Dental Injury: Teeth and Oropharynx as per pre-operative assessment

## 2023-05-20 NOTE — Consult Note (Signed)
Final    VAS US CAROTID  Result Date: 05/19/2023 Carotid Arterial Duplex Study Patient Name:  [name]  Date of Exam:   05/19/2023 Medical Rec #: [id number]      Accession #:    [id number] Date of Birth: [birth year]       Patient Gender: M Patient Age:   62 years Exam Location:  [facility] Procedure:      VAS US CAROTID Referring Phys: Evelene Croon --------------------------------------------------------------------------------  Indications:       Pre operative redo aortic valve. Risk Factors:      Hypertension, hyperlipidemia, Diabetes, coronary artery                    disease, prior CVA. Other Factors:     ESRD, Endocarditis,. Limitations        Today's exam was limited due to the patient's respiratory                    variation. Comparison Study:  Prior carotid duplex done 04/12/18 indicating 1-39% ICA                    stenosis Performing Technologist: Sherren Kerns RVS  Examination Guidelines: A complete evaluation includes B-mode imaging, spectral Doppler, color Doppler, and power Doppler as needed of all accessible portions of each vessel. Bilateral testing is considered an integral part of a complete examination. Limited examinations for reoccurring indications may be performed as noted.  Right Carotid Findings: +----------+--------+--------+--------+------------------+------------------+           PSV cm/sEDV cm/sStenosisPlaque DescriptionComments           +----------+--------+--------+--------+------------------+------------------+ CCA Prox  68      9                                 intimal thickening +----------+--------+--------+--------+------------------+------------------+ CCA Distal44      7                                 intimal thickening +----------+--------+--------+--------+------------------+------------------+ ICA Prox  42      16      1-39%   calcific                              +----------+--------+--------+--------+------------------+------------------+ ICA Mid   57      19                                                   +----------+--------+--------+--------+------------------+------------------+ ICA Distal58      19                                                   +----------+--------+--------+--------+------------------+------------------+ ECA       52      1                                                    +----------+--------+--------+--------+------------------+------------------+ +----------+--------+-------+--------+-------------------+  Aortic valve replacement. No focal consolidation. Interval increase in increased interstitial markings. Question developing patchy airspace opacities within the left lung. At least trace volume right pleural effusion. No pneumothorax. No acute osseous abnormality.  Sternotomy wires are intact. IMPRESSION: 1. Pulmonary edema with question developing superimposed infection on the left. 2.  At least trace volume right pleural effusion. Electronically Signed   By: Tish Frederickson M.D.   On: 05/19/2023 10:08    ECHO TEE  Result Date: 05/18/2023    TRANSESOPHOGEAL ECHO REPORT   Patient Name:   [name] Date of Exam: 05/18/2023 Medical Rec #:  [id number]     Height:       66.0 in Accession #:    [id number]    Weight:       197.3 lb Date of Birth:  1960/12/30      BSA:          1.988 m Patient Age:    62 years      BP:           130/88 mmHg Patient Gender: M             HR:           80 bpm. Exam Location:  Inpatient Procedure: Transesophageal Echo Indications:    Aortic valve dehiscence  History:        Patient has prior history of Echocardiogram examinations. Aortic                 Valve Disease.  Sonographer:    Darlys Gales Referring Phys: [id number] MAHESH A CHANDRASEKHAR PROCEDURE: The transesophogeal probe was passed without difficulty through the esophogus of the patient. Sedation performed by different physician. The patient developed no complications during the procedure.  IMPRESSIONS  1. A 23 mm Magna Ease Valve is dehisced. There are two perivalular leaks, One adjacent to the left cusp where deshicence is most prominent and a second adjacent to the right cusp. Best seen in 3D plane crop and 3D glass views. There is no holodiastolic flow reversal. Deep gastric imaging not performed, due to moderate sedation due to tenuous status and need for emergency procedure. . The aortic valve has been repaired/replaced. Aortic valve regurgitation is moderate to severe.  2. A 12 mm x 11 mm echodensity subvalvular to the tricuspid valve annulus is noted. This does not cause the mechanism of central tricubspid regurgitation.. The tricuspid valve is abnormal. Tricuspid valve regurgitation is severe.  3. Aortic dilatation noted. Aneurysm of the aortic root, measuring 50 mm. There is severe dilatation of the aortic root, measuring 50 mm. There is mild dilatation of the ascending aorta, measuring 43 mm.  4. Left ventricular ejection fraction, by estimation, is 55 to 60%. The left ventricle has normal function.  5. Large pleural  effusion in both left and right lateral regions.  6. Right ventricular systolic function is normal. The right ventricular size is dilated. There is severely elevated pulmonary artery systolic pressure. The estimated right ventricular systolic pressure is 60.2 mmHg.  7. The inferior vena cava is dilated in size with <50% respiratory variability, suggesting right atrial pressure of 15 mmHg.  8. Left atrial size was mild to moderately dilated. No left atrial/left atrial appendage thrombus was detected.  9. Right atrial size was mild to moderately dilated. 10. The mitral valve is normal in structure. Trivial mitral valve regurgitation. No evidence of mitral stenosis. FINDINGS  Left Ventricle: Left ventricular ejection fraction, by estimation, is 55 to 60%. The  2.3  --   --   --   --   --   --   --   --   --   --   PHOS  --  5.5* 4.0 3.8 2.7  --   --   --   --   --   --   --   --   --   --    < > = values in this interval not displayed.    Liver Function Tests: Recent Labs  Lab 05/17/23 0950 05/18/23 0411 05/19/23 0636 05/19/23 1132 05/20/23 0310  AST 14* 13*  --   --   --   ALT <5 8  --   --   --   ALKPHOS 98 97  --   --   --   BILITOT 1.5* 2.0*  --   --   --   PROT 8.2* 8.5*  --   --   --   ALBUMIN 3.5 3.5 3.3* 3.4* 3.0*   No results for input(s): "LIPASE", "AMYLASE" in the last 168 hours. No results for input(s): "AMMONIA" in the last 168  hours.  CBC: Recent Labs  Lab 05/17/23 0852 05/17/23 1021 05/17/23 2116 05/18/23 0411 05/19/23 0636 05/20/23 0309 05/20/23 0754 05/20/23 1512 05/20/23 1519 05/20/23 1611 05/20/23 1640 05/20/23 1720 05/20/23 1736 05/20/23 1740  WBC 11.6* 12.0*  --  12.1* 10.8* 12.3*  --   --   --   --   --   --   --   --   NEUTROABS 4.4  --   --   --  5.5  --   --   --   --   --   --   --   --   --   HGB 6.4* 6.0*   < > 7.6* 8.1* 7.3*   < > 7.7*   < > 8.5* 7.8* 9.9* 7.8* 9.2*  HCT 20.0* 19.7*   < > 23.8* 25.1* 22.9*   < > 22.6*   < > 25.0* 23.0* 29.0* 23.0* 27.0*  MCV 93.9 96.6  --  97.1 89.3 90.5  --   --   --   --   --   --   --   --   PLT 109* 128*  --  110* 114* 118*  --  52*  --   --   --   --   --   --    < > = values in this interval not displayed.    Cardiac Enzymes: No results for input(s): "CKTOTAL", "CKMB", "CKMBINDEX", "TROPONINI" in the last 168 hours.  BNP: BNP (last 3 results) Recent Labs    05/17/23 1100  BNP 2,097.0*    ProBNP (last 3 results) No results for input(s): "PROBNP" in the last 8760 hours.   CBG: Recent Labs  Lab 05/19/23 1619 05/19/23 1953 05/19/23 2307 05/20/23 0335 05/20/23 0617  GLUCAP 108* 113* 141* 112* 102*    Coagulation Studies: No results for input(s): "LABPROT", "INR" in the last 72 hours.  Other results:  Imaging: VAS [place] LOWER EXTREMITY VENOUS (DVT)  Result Date: 05/19/2023  Lower Venous DVT Study Patient Name:  DEYON WESSON  Date of Exam:   05/19/2023 Medical Rec #: [id number]      Accession #:    [id number] Date of Birth: August 01, 1961       Patient Gender: M Patient Age:   14 years Exam Location:  [facility] Procedure:  Final    VAS US CAROTID  Result Date: 05/19/2023 Carotid Arterial Duplex Study Patient Name:  [name]  Date of Exam:   05/19/2023 Medical Rec #: [id number]      Accession #:    [id number] Date of Birth: [birth year]       Patient Gender: M Patient Age:   62 years Exam Location:  [facility] Procedure:      VAS US CAROTID Referring Phys: Evelene Croon --------------------------------------------------------------------------------  Indications:       Pre operative redo aortic valve. Risk Factors:      Hypertension, hyperlipidemia, Diabetes, coronary artery                    disease, prior CVA. Other Factors:     ESRD, Endocarditis,. Limitations        Today's exam was limited due to the patient's respiratory                    variation. Comparison Study:  Prior carotid duplex done 04/12/18 indicating 1-39% ICA                    stenosis Performing Technologist: Sherren Kerns RVS  Examination Guidelines: A complete evaluation includes B-mode imaging, spectral Doppler, color Doppler, and power Doppler as needed of all accessible portions of each vessel. Bilateral testing is considered an integral part of a complete examination. Limited examinations for reoccurring indications may be performed as noted.  Right Carotid Findings: +----------+--------+--------+--------+------------------+------------------+           PSV cm/sEDV cm/sStenosisPlaque DescriptionComments           +----------+--------+--------+--------+------------------+------------------+ CCA Prox  68      9                                 intimal thickening +----------+--------+--------+--------+------------------+------------------+ CCA Distal44      7                                 intimal thickening +----------+--------+--------+--------+------------------+------------------+ ICA Prox  42      16      1-39%   calcific                              +----------+--------+--------+--------+------------------+------------------+ ICA Mid   57      19                                                   +----------+--------+--------+--------+------------------+------------------+ ICA Distal58      19                                                   +----------+--------+--------+--------+------------------+------------------+ ECA       52      1                                                    +----------+--------+--------+--------+------------------+------------------+ +----------+--------+-------+--------+-------------------+  Final    VAS US CAROTID  Result Date: 05/19/2023 Carotid Arterial Duplex Study Patient Name:  [name]  Date of Exam:   05/19/2023 Medical Rec #: [id number]      Accession #:    [id number] Date of Birth: [birth year]       Patient Gender: M Patient Age:   62 years Exam Location:  [facility] Procedure:      VAS US CAROTID Referring Phys: Evelene Croon --------------------------------------------------------------------------------  Indications:       Pre operative redo aortic valve. Risk Factors:      Hypertension, hyperlipidemia, Diabetes, coronary artery                    disease, prior CVA. Other Factors:     ESRD, Endocarditis,. Limitations        Today's exam was limited due to the patient's respiratory                    variation. Comparison Study:  Prior carotid duplex done 04/12/18 indicating 1-39% ICA                    stenosis Performing Technologist: Sherren Kerns RVS  Examination Guidelines: A complete evaluation includes B-mode imaging, spectral Doppler, color Doppler, and power Doppler as needed of all accessible portions of each vessel. Bilateral testing is considered an integral part of a complete examination. Limited examinations for reoccurring indications may be performed as noted.  Right Carotid Findings: +----------+--------+--------+--------+------------------+------------------+           PSV cm/sEDV cm/sStenosisPlaque DescriptionComments           +----------+--------+--------+--------+------------------+------------------+ CCA Prox  68      9                                 intimal thickening +----------+--------+--------+--------+------------------+------------------+ CCA Distal44      7                                 intimal thickening +----------+--------+--------+--------+------------------+------------------+ ICA Prox  42      16      1-39%   calcific                              +----------+--------+--------+--------+------------------+------------------+ ICA Mid   57      19                                                   +----------+--------+--------+--------+------------------+------------------+ ICA Distal58      19                                                   +----------+--------+--------+--------+------------------+------------------+ ECA       52      1                                                    +----------+--------+--------+--------+------------------+------------------+ +----------+--------+-------+--------+-------------------+  2.3  --   --   --   --   --   --   --   --   --   --   PHOS  --  5.5* 4.0 3.8 2.7  --   --   --   --   --   --   --   --   --   --    < > = values in this interval not displayed.    Liver Function Tests: Recent Labs  Lab 05/17/23 0950 05/18/23 0411 05/19/23 0636 05/19/23 1132 05/20/23 0310  AST 14* 13*  --   --   --   ALT <5 8  --   --   --   ALKPHOS 98 97  --   --   --   BILITOT 1.5* 2.0*  --   --   --   PROT 8.2* 8.5*  --   --   --   ALBUMIN 3.5 3.5 3.3* 3.4* 3.0*   No results for input(s): "LIPASE", "AMYLASE" in the last 168 hours. No results for input(s): "AMMONIA" in the last 168  hours.  CBC: Recent Labs  Lab 05/17/23 0852 05/17/23 1021 05/17/23 2116 05/18/23 0411 05/19/23 0636 05/20/23 0309 05/20/23 0754 05/20/23 1512 05/20/23 1519 05/20/23 1611 05/20/23 1640 05/20/23 1720 05/20/23 1736 05/20/23 1740  WBC 11.6* 12.0*  --  12.1* 10.8* 12.3*  --   --   --   --   --   --   --   --   NEUTROABS 4.4  --   --   --  5.5  --   --   --   --   --   --   --   --   --   HGB 6.4* 6.0*   < > 7.6* 8.1* 7.3*   < > 7.7*   < > 8.5* 7.8* 9.9* 7.8* 9.2*  HCT 20.0* 19.7*   < > 23.8* 25.1* 22.9*   < > 22.6*   < > 25.0* 23.0* 29.0* 23.0* 27.0*  MCV 93.9 96.6  --  97.1 89.3 90.5  --   --   --   --   --   --   --   --   PLT 109* 128*  --  110* 114* 118*  --  52*  --   --   --   --   --   --    < > = values in this interval not displayed.    Cardiac Enzymes: No results for input(s): "CKTOTAL", "CKMB", "CKMBINDEX", "TROPONINI" in the last 168 hours.  BNP: BNP (last 3 results) Recent Labs    05/17/23 1100  BNP 2,097.0*    ProBNP (last 3 results) No results for input(s): "PROBNP" in the last 8760 hours.   CBG: Recent Labs  Lab 05/19/23 1619 05/19/23 1953 05/19/23 2307 05/20/23 0335 05/20/23 0617  GLUCAP 108* 113* 141* 112* 102*    Coagulation Studies: No results for input(s): "LABPROT", "INR" in the last 72 hours.  Other results:  Imaging: VAS [place] LOWER EXTREMITY VENOUS (DVT)  Result Date: 05/19/2023  Lower Venous DVT Study Patient Name:  DEYON WESSON  Date of Exam:   05/19/2023 Medical Rec #: [id number]      Accession #:    [id number] Date of Birth: August 01, 1961       Patient Gender: M Patient Age:   14 years Exam Location:  [facility] Procedure:  2.3  --   --   --   --   --   --   --   --   --   --   PHOS  --  5.5* 4.0 3.8 2.7  --   --   --   --   --   --   --   --   --   --    < > = values in this interval not displayed.    Liver Function Tests: Recent Labs  Lab 05/17/23 0950 05/18/23 0411 05/19/23 0636 05/19/23 1132 05/20/23 0310  AST 14* 13*  --   --   --   ALT <5 8  --   --   --   ALKPHOS 98 97  --   --   --   BILITOT 1.5* 2.0*  --   --   --   PROT 8.2* 8.5*  --   --   --   ALBUMIN 3.5 3.5 3.3* 3.4* 3.0*   No results for input(s): "LIPASE", "AMYLASE" in the last 168 hours. No results for input(s): "AMMONIA" in the last 168  hours.  CBC: Recent Labs  Lab 05/17/23 0852 05/17/23 1021 05/17/23 2116 05/18/23 0411 05/19/23 0636 05/20/23 0309 05/20/23 0754 05/20/23 1512 05/20/23 1519 05/20/23 1611 05/20/23 1640 05/20/23 1720 05/20/23 1736 05/20/23 1740  WBC 11.6* 12.0*  --  12.1* 10.8* 12.3*  --   --   --   --   --   --   --   --   NEUTROABS 4.4  --   --   --  5.5  --   --   --   --   --   --   --   --   --   HGB 6.4* 6.0*   < > 7.6* 8.1* 7.3*   < > 7.7*   < > 8.5* 7.8* 9.9* 7.8* 9.2*  HCT 20.0* 19.7*   < > 23.8* 25.1* 22.9*   < > 22.6*   < > 25.0* 23.0* 29.0* 23.0* 27.0*  MCV 93.9 96.6  --  97.1 89.3 90.5  --   --   --   --   --   --   --   --   PLT 109* 128*  --  110* 114* 118*  --  52*  --   --   --   --   --   --    < > = values in this interval not displayed.    Cardiac Enzymes: No results for input(s): "CKTOTAL", "CKMB", "CKMBINDEX", "TROPONINI" in the last 168 hours.  BNP: BNP (last 3 results) Recent Labs    05/17/23 1100  BNP 2,097.0*    ProBNP (last 3 results) No results for input(s): "PROBNP" in the last 8760 hours.   CBG: Recent Labs  Lab 05/19/23 1619 05/19/23 1953 05/19/23 2307 05/20/23 0335 05/20/23 0617  GLUCAP 108* 113* 141* 112* 102*    Coagulation Studies: No results for input(s): "LABPROT", "INR" in the last 72 hours.  Other results:  Imaging: VAS [place] LOWER EXTREMITY VENOUS (DVT)  Result Date: 05/19/2023  Lower Venous DVT Study Patient Name:  DEYON WESSON  Date of Exam:   05/19/2023 Medical Rec #: [id number]      Accession #:    [id number] Date of Birth: August 01, 1961       Patient Gender: M Patient Age:   14 years Exam Location:  [facility] Procedure:  2.3  --   --   --   --   --   --   --   --   --   --   PHOS  --  5.5* 4.0 3.8 2.7  --   --   --   --   --   --   --   --   --   --    < > = values in this interval not displayed.    Liver Function Tests: Recent Labs  Lab 05/17/23 0950 05/18/23 0411 05/19/23 0636 05/19/23 1132 05/20/23 0310  AST 14* 13*  --   --   --   ALT <5 8  --   --   --   ALKPHOS 98 97  --   --   --   BILITOT 1.5* 2.0*  --   --   --   PROT 8.2* 8.5*  --   --   --   ALBUMIN 3.5 3.5 3.3* 3.4* 3.0*   No results for input(s): "LIPASE", "AMYLASE" in the last 168 hours. No results for input(s): "AMMONIA" in the last 168  hours.  CBC: Recent Labs  Lab 05/17/23 0852 05/17/23 1021 05/17/23 2116 05/18/23 0411 05/19/23 0636 05/20/23 0309 05/20/23 0754 05/20/23 1512 05/20/23 1519 05/20/23 1611 05/20/23 1640 05/20/23 1720 05/20/23 1736 05/20/23 1740  WBC 11.6* 12.0*  --  12.1* 10.8* 12.3*  --   --   --   --   --   --   --   --   NEUTROABS 4.4  --   --   --  5.5  --   --   --   --   --   --   --   --   --   HGB 6.4* 6.0*   < > 7.6* 8.1* 7.3*   < > 7.7*   < > 8.5* 7.8* 9.9* 7.8* 9.2*  HCT 20.0* 19.7*   < > 23.8* 25.1* 22.9*   < > 22.6*   < > 25.0* 23.0* 29.0* 23.0* 27.0*  MCV 93.9 96.6  --  97.1 89.3 90.5  --   --   --   --   --   --   --   --   PLT 109* 128*  --  110* 114* 118*  --  52*  --   --   --   --   --   --    < > = values in this interval not displayed.    Cardiac Enzymes: No results for input(s): "CKTOTAL", "CKMB", "CKMBINDEX", "TROPONINI" in the last 168 hours.  BNP: BNP (last 3 results) Recent Labs    05/17/23 1100  BNP 2,097.0*    ProBNP (last 3 results) No results for input(s): "PROBNP" in the last 8760 hours.   CBG: Recent Labs  Lab 05/19/23 1619 05/19/23 1953 05/19/23 2307 05/20/23 0335 05/20/23 0617  GLUCAP 108* 113* 141* 112* 102*    Coagulation Studies: No results for input(s): "LABPROT", "INR" in the last 72 hours.  Other results:  Imaging: VAS [place] LOWER EXTREMITY VENOUS (DVT)  Result Date: 05/19/2023  Lower Venous DVT Study Patient Name:  DEYON WESSON  Date of Exam:   05/19/2023 Medical Rec #: [id number]      Accession #:    [id number] Date of Birth: August 01, 1961       Patient Gender: M Patient Age:   14 years Exam Location:  [facility] Procedure:  2.3  --   --   --   --   --   --   --   --   --   --   PHOS  --  5.5* 4.0 3.8 2.7  --   --   --   --   --   --   --   --   --   --    < > = values in this interval not displayed.    Liver Function Tests: Recent Labs  Lab 05/17/23 0950 05/18/23 0411 05/19/23 0636 05/19/23 1132 05/20/23 0310  AST 14* 13*  --   --   --   ALT <5 8  --   --   --   ALKPHOS 98 97  --   --   --   BILITOT 1.5* 2.0*  --   --   --   PROT 8.2* 8.5*  --   --   --   ALBUMIN 3.5 3.5 3.3* 3.4* 3.0*   No results for input(s): "LIPASE", "AMYLASE" in the last 168 hours. No results for input(s): "AMMONIA" in the last 168  hours.  CBC: Recent Labs  Lab 05/17/23 0852 05/17/23 1021 05/17/23 2116 05/18/23 0411 05/19/23 0636 05/20/23 0309 05/20/23 0754 05/20/23 1512 05/20/23 1519 05/20/23 1611 05/20/23 1640 05/20/23 1720 05/20/23 1736 05/20/23 1740  WBC 11.6* 12.0*  --  12.1* 10.8* 12.3*  --   --   --   --   --   --   --   --   NEUTROABS 4.4  --   --   --  5.5  --   --   --   --   --   --   --   --   --   HGB 6.4* 6.0*   < > 7.6* 8.1* 7.3*   < > 7.7*   < > 8.5* 7.8* 9.9* 7.8* 9.2*  HCT 20.0* 19.7*   < > 23.8* 25.1* 22.9*   < > 22.6*   < > 25.0* 23.0* 29.0* 23.0* 27.0*  MCV 93.9 96.6  --  97.1 89.3 90.5  --   --   --   --   --   --   --   --   PLT 109* 128*  --  110* 114* 118*  --  52*  --   --   --   --   --   --    < > = values in this interval not displayed.    Cardiac Enzymes: No results for input(s): "CKTOTAL", "CKMB", "CKMBINDEX", "TROPONINI" in the last 168 hours.  BNP: BNP (last 3 results) Recent Labs    05/17/23 1100  BNP 2,097.0*    ProBNP (last 3 results) No results for input(s): "PROBNP" in the last 8760 hours.   CBG: Recent Labs  Lab 05/19/23 1619 05/19/23 1953 05/19/23 2307 05/20/23 0335 05/20/23 0617  GLUCAP 108* 113* 141* 112* 102*    Coagulation Studies: No results for input(s): "LABPROT", "INR" in the last 72 hours.  Other results:  Imaging: VAS [place] LOWER EXTREMITY VENOUS (DVT)  Result Date: 05/19/2023  Lower Venous DVT Study Patient Name:  DEYON WESSON  Date of Exam:   05/19/2023 Medical Rec #: [id number]      Accession #:    [id number] Date of Birth: August 01, 1961       Patient Gender: M Patient Age:   14 years Exam Location:  [facility] Procedure:  Final    VAS US CAROTID  Result Date: 05/19/2023 Carotid Arterial Duplex Study Patient Name:  [name]  Date of Exam:   05/19/2023 Medical Rec #: [id number]      Accession #:    [id number] Date of Birth: [birth year]       Patient Gender: M Patient Age:   62 years Exam Location:  [facility] Procedure:      VAS US CAROTID Referring Phys: Evelene Croon --------------------------------------------------------------------------------  Indications:       Pre operative redo aortic valve. Risk Factors:      Hypertension, hyperlipidemia, Diabetes, coronary artery                    disease, prior CVA. Other Factors:     ESRD, Endocarditis,. Limitations        Today's exam was limited due to the patient's respiratory                    variation. Comparison Study:  Prior carotid duplex done 04/12/18 indicating 1-39% ICA                    stenosis Performing Technologist: Sherren Kerns RVS  Examination Guidelines: A complete evaluation includes B-mode imaging, spectral Doppler, color Doppler, and power Doppler as needed of all accessible portions of each vessel. Bilateral testing is considered an integral part of a complete examination. Limited examinations for reoccurring indications may be performed as noted.  Right Carotid Findings: +----------+--------+--------+--------+------------------+------------------+           PSV cm/sEDV cm/sStenosisPlaque DescriptionComments           +----------+--------+--------+--------+------------------+------------------+ CCA Prox  68      9                                 intimal thickening +----------+--------+--------+--------+------------------+------------------+ CCA Distal44      7                                 intimal thickening +----------+--------+--------+--------+------------------+------------------+ ICA Prox  42      16      1-39%   calcific                              +----------+--------+--------+--------+------------------+------------------+ ICA Mid   57      19                                                   +----------+--------+--------+--------+------------------+------------------+ ICA Distal58      19                                                   +----------+--------+--------+--------+------------------+------------------+ ECA       52      1                                                    +----------+--------+--------+--------+------------------+------------------+ +----------+--------+-------+--------+-------------------+  Final    VAS US CAROTID  Result Date: 05/19/2023 Carotid Arterial Duplex Study Patient Name:  [name]  Date of Exam:   05/19/2023 Medical Rec #: [id number]      Accession #:    [id number] Date of Birth: [birth year]       Patient Gender: M Patient Age:   62 years Exam Location:  [facility] Procedure:      VAS US CAROTID Referring Phys: Evelene Croon --------------------------------------------------------------------------------  Indications:       Pre operative redo aortic valve. Risk Factors:      Hypertension, hyperlipidemia, Diabetes, coronary artery                    disease, prior CVA. Other Factors:     ESRD, Endocarditis,. Limitations        Today's exam was limited due to the patient's respiratory                    variation. Comparison Study:  Prior carotid duplex done 04/12/18 indicating 1-39% ICA                    stenosis Performing Technologist: Sherren Kerns RVS  Examination Guidelines: A complete evaluation includes B-mode imaging, spectral Doppler, color Doppler, and power Doppler as needed of all accessible portions of each vessel. Bilateral testing is considered an integral part of a complete examination. Limited examinations for reoccurring indications may be performed as noted.  Right Carotid Findings: +----------+--------+--------+--------+------------------+------------------+           PSV cm/sEDV cm/sStenosisPlaque DescriptionComments           +----------+--------+--------+--------+------------------+------------------+ CCA Prox  68      9                                 intimal thickening +----------+--------+--------+--------+------------------+------------------+ CCA Distal44      7                                 intimal thickening +----------+--------+--------+--------+------------------+------------------+ ICA Prox  42      16      1-39%   calcific                              +----------+--------+--------+--------+------------------+------------------+ ICA Mid   57      19                                                   +----------+--------+--------+--------+------------------+------------------+ ICA Distal58      19                                                   +----------+--------+--------+--------+------------------+------------------+ ECA       52      1                                                    +----------+--------+--------+--------+------------------+------------------+ +----------+--------+-------+--------+-------------------+

## 2023-05-20 NOTE — Progress Notes (Signed)
ECMO INITIATION   Patient: [name], [birth year], 62 y.o. Location:   Date of Service:  05/16/2023     Time: 7:38 PM  Date of Admission: 05/18/2023 Admitting diagnosis: Acute on chronic combined systolic and diastolic CHF (congestive heart failure) (HCC)  Ht: 5\' 6"  (167.6 cm) Wt: 84.8 kg BSA: Body surface area is 1.99 meters squared.  Blood Type: B POS Allergies:  Allergies  Allergen Reactions   Other Anaphylaxis    Mushrooms  Not listed on MAR    Plavix [Clopidogrel Bisulfate] Other (See Comments)    TTP Not listed on the [facility]   Fleet Enema [Enema] Other (See Comments)    Unknown reaction   Lovenox [Enoxaparin] Other (See Comments)    Unknown reaction   Morphine Other (See Comments)    Unknown reaction   Nsaids Other (See Comments)    Unknown reaction   Hydrocodone-Acetaminophen Itching    Past medical history:  Past Medical History:  Diagnosis Date   Allergy    Anemia    Anxiety    Baker's cyst of knee    Blood transfusion without reported diagnosis    as baby    Coronary artery disease    quadruple bypass - March 2016   GERD (gastroesophageal reflux disease)    Gouty arthritis    "real bad" (01/17/2013)   Heart murmur    Hypercholesteremia    Hypertension    MSSA bacteremia 01/29/2023   Myocardial infarction (HCC) 2017   PEA (Pulseless electrical activity) (HCC) 01/29/2023   Stroke (HCC)    Type II diabetes mellitus (HCC)    Past surgical history:  Past Surgical History:  Procedure Laterality Date   AORTIC VALVE REPLACEMENT N/A 11/25/2015   Procedure: AORTIC VALVE REPLACEMENT (AVR) USING A MAGNA EASE ;  Surgeon: Alleen Borne, MD;  Location: [facility] OR;  Service: Open Heart Surgery;  Laterality: N/A;   CARDIAC CATHETERIZATION N/A 11/19/2015   Procedure: Right/Left Heart Cath and Coronary Angiography;  Surgeon: Lyn Records, MD;  Location: [facility] INVASIVE CV LAB;  Service: Cardiovascular;  Laterality: N/A;   COLONOSCOPY  2004   CORONARY ARTERY BYPASS GRAFT  N/A 11/25/2015   Procedure: CORONARY ARTERY BYPASS GRAFTING (CABG)x4 LIMA-LAD; SVG-OM; SVG-RCA; SVG-DIAG;  Surgeon: Alleen Borne, MD;  Location: [facility] OR;  Service: Open Heart Surgery;  Laterality: N/A;   CYST REMOVAL TRUNK N/A 08/24/2016   Procedure: EXCISION OF BACK ABSCESS;  Surgeon: Jimmye Norman, MD;  Location: [facility];  Service: General;  Laterality: N/A;   FINGER AMPUTATION Right 1980's   3rd & 4th digits "got them mashed" (01/17/2013)   KNEE ARTHROSCOPY Right 1980's   MULTIPLE EXTRACTIONS WITH ALVEOLOPLASTY Bilateral 11/21/2015   Procedure: Extraction of tooth #'s 2,12 with alveoloplasty and gross debridement of remaining dentition;  Surgeon: Charlynne Pander, DDS;  Location: [facility] OR;  Service: Oral Surgery;  Laterality: Bilateral;   TEE WITHOUT CARDIOVERSION N/A 11/20/2015   Procedure: TRANSESOPHAGEAL ECHOCARDIOGRAM (TEE);  Surgeon: Laurey Morale, MD;  Location: [facility] ENDOSCOPY;  Service: Cardiovascular;  Laterality: N/A;   TEE WITHOUT CARDIOVERSION N/A 11/25/2015   Procedure: TRANSESOPHAGEAL ECHOCARDIOGRAM (TEE);  Surgeon: Alleen Borne, MD;  Location: [facility] OR;  Service: Open Heart Surgery;  Laterality: N/A;    Indication for ECMO: Post-Cardiotomy  ECMO was deployed at 1710 and initiated at 1810  Cannulated for VA and achieved initial ECMO 4.28LPM  and ECMO 2 sweep .    ECMO Cannula Information     Staff Present  Primary Perfusionist Larry Sierras   Assisting Perfusionist/ECMO Specialist Devota Pace RN  Cannulating Physician Evelene Croon   ECMO Lot Numbers  CardioHelp Console  [id number]  Oxygenator  [id number]  Tubing Pack  [id number]  ECMO Goals  Flow goal   4.5-5LPM  Anticoagulation goal   N/A  Cardiac goal    MAP > 65  Respiratory goal    O2 > 88%  Other goal       ECMO Handoff  Patient Information * Age Height Weight BSA IBW BMI  62 y.o. 5\' 6"  (167.6 cm)  (84.8 kg Body surface area is 1.99 meters squared. No data recorded Body mass index is  30.17 kg/m.   Review History * Primary Diagnosis   Acute on chronic combined systolic and diastolic CHF (congestive heart failure) (HCC)  Prior Cardiac Arrest within 24hrs of ECMO initiation?   ECMO and MCS * Type ECMO Flow ECMO Sweep Gases   [facility] ECMO    5.06 LPM   2 Sweep      Additional Mechanical Support   Ventilation *          Cannula Size and Locations       Drainage 36 Fr RA   Return 20 Fr Ao    *Cannula(e) sutured and anchored, secured and dressed.   Labs and Imaging *  *Cannulation position verified via imaging on arrival to ICU. Concerns communicated to attending surgeon. Labs reviewed.   All ECMO safety checks complete. ECMO flowsheet initiated, applicable charges captured, LDA's entered/confirmed, imaging and labs verified, blood products available, and report given to Althea Grimmer RN.

## 2023-05-20 NOTE — Op Note (Signed)
CARDIOVASCULAR SURGERY OPERATIVE NOTE  05/20/2023  Surgeon:  Alleen Borne, MD  First Assistant: Lowella Dandy, PA-C : An experienced assistant was required given the complexity of this surgery and the standard of surgical care. The assistant was needed for endoscopic vein harvest, exposure, dissection, suctioning, retraction of delicate tissues and sutures, instrument exchange and for overall help during this procedure.    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   Aortic root replacement using a 25mm Homograft valve conduit with reimplantation of coronary arteries. 4.   Extension of RCA vein graft with a segment of greater saphenous vein. 5.   Tricuspid valve annuloplasty.  Anesthesia:  General Endotracheal  Anesthesiologist: Dr. 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  He presented with progressive volume overload and congestive heart failure after discontinuation of hemodialysis 1 week ago.  The etiology of his prosthetic aortic valve dehiscence is either endocarditis or progressive enlargement of his aneurysm with weakening of the tissue between the annulus and the sewing ring.  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   He had blood cultures sent and  started on empiric antibiotic therapy.  Cultures were negative this morning so far. He underwent dialysis on CRRT for volume removal and correction of acidosis and hyperkalemia.  His shortness of breath and oxygenation have improved significantly with volume removal by CRRT.  I think we probably should proceed with surgery to decrease the risk of decompensation over the weekend.  He is not going to get better until this mechanical problem of aortic valve dehiscence is corrected. I discussed the operative procedure with the patient and his brother including alternatives, benefits and risks; including but not limited to bleeding, blood transfusion, infection, stroke, myocardial infarction, graft failure, heart block requiring a permanent pacemaker, organ dysfunction, and death.  [name] understands and agrees to proceed.    Pre-bypass TEE:  Dehiscence of prosthetic aortic valve with severe AI. LV and RV function good. Trivial MR, severe TR that is central with no vegetations seen.   Preparation:  The patient was seen in the preoperative holding area and the correct patient, correct operation were confirmed with the patient after reviewing the medical record and catheterization. The consent was signed by me. Preoperative antibiotics were given. A pulmonary arterial line and radial arterial line were placed by the anesthesia team in the OR since the patient could not lie flat. The patient was taken back to the operating room and positioned supine on the operating room table. After being placed under general endotracheal anesthesia by the anesthesia team a foley catheter was placed. The neck, chest, abdomen, and both legs were prepped with betadine soap and solution and draped in the usual sterile manner. A surgical time-out was taken and the correct patient and operative procedure were confirmed with the nursing and anesthesia staff.   Cardiopulmonary Bypass:  A redo median sternotomy was performed  using the oscillating saw without difficulty. The sternal edges were retracted with bone hooks and the heart was dissected from the sternum using electrocautery. The right atrium and ascending aorta were exposed by dividing the adhesions without difficulty.  The ascending aorta was of normal size and had no palpable plaque. A left ventricular vent was placed via the right superior pulmonary vein and a retrograde cardioplegia cannula was placed via the right atrium and advanced into the coronary sinus.  Aortic occlusion was performed with a single cross-clamp. Systemic cooling to 28 degrees Centigrade and topical cooling of the heart with iced saline were used. Retrograde cold KBC cardioplegia was used to induce diastolic arrest and was then given at about 60 minute intervals throughout the period of arrest to maintain myocardial temperature at or below 10 degrees centigrade. A temperature probe was inserted into the interventricular septum and an insulating pad was placed in the pericardium. CO2 was insufflated into the pericardium throughout the case to minimize intracardiac air.   Aortic root replacement: Assisted for the entire case by Lowella Dandy, PA-C.  The ascending aorta was transected above the STJ. Examination of the aortic root showed that the previous prosthetic valve had dehisced from the annulus over approximately 3/4 of the circumference along the left and right coronary sinuses. 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 The right and left coronary arteries were removed from the aortic root with a button of aortic wall around the ostia. The right ostium was calcified. They were retracted carefully out of the way with stay sutures to prevent rotation. The old prosthetic valve was removed without difficulty. It essentially was only being held in place by weak degenerated tissue that looked like endocarditis although there was no pus. The valve was sent for culture. The  annulus was debrided back to healthy appearing tissue and the tissue that was removed was sent for culture.  I sized the residual annulus and a 25 mm Homograft valve/root was felt to be the best choice since this looked like endocarditis and most of the annulus had to be debrided.  A series of 4-0 Ethibond simple sutures were placed around the annulus. A 25 mm cryopreserved homograft aortic valve/root ( ID [id number]) was prepared. The mitral leaflet was excised along with the excess septal muscle. The sutures were placed through the base of the homograft root taking care not to injure the valve leaflets. The homograft was lowered into place in the LVOT. The sutures were tied sequentially. The homograft left and right coronary stumps were too low for the coronary anastomoses so they were suture ligated with 4-0 prolene suture.  An opening was made higher in the sinus using a punch and the patient's left coronary button was anastomosed to it in an end to side manner using continuous 5-0 prolene suture. A new opening was made higher in the right sinus and the right coronary button was anastomosed in the same manner.  A light coating of CoSeal was applied to each anastomosis for hemostasis. The homograft aorta was cut to the appropriate length and anastomosed to the patient's ascending aorta in end to end manner using continuous 3-0 prolene suture. CoSeal was applied to seal the needle holes in the grafts. A vent cannula was placed into the aorta to remove any air.   Right coronary vein graft extension:  A segment of left GSV was anastomosed to the distal end of the transected RCA vein graft in end to end manner using continuous  7-0 Prolene suture. The other end was anastomosed to the hood of the old diagonal vein graft in end to side manner using continuous 6-0 Prolene suture. The aortic stump of the RCA vein graft was suture ligated with 4-0 prolene.    Tricuspid Valve Annuloplasty:  The vena cavae were  encircled with tapes. The right atrium was opened obliquely. A retractor was used to expose the valve and exposure was excellent. The valve structurally looked good but somewhat degenerative with annular enlargement of 4.7 cm on echo with only central regurgitation The anterior leaflet and the distance between the anteroseptal and posteroseptal commissures was measured and a 34 mm Edwards MC3 ring was chosen. This had model number [id number] and serial number [id number]. Then 2-0 Ethibond sutures were placed around the annulus. The sutures were placed through the  ring and the ring lowered into place. The sutures were tied using CorKnots. The valve was tested with saline and there was mild residual leak. The atrium was closed in two layers with 4-0 prolene suture.     Completion:  The patient was rewarmed to 37 degrees Centigrade. The crossclamp was removed with a time of 335 minutes. There was spontaneous return of  sinus rhythm. The vascular anastomoses all appeared hemostatic. Two temporary epicardial pacing wires were placed on the right atrium and two on the right ventricle. The patient was weaned from CPB without difficulty on low dose epinephrine. CPB time was 397 minutes. TEE showed normal homograft aortic valve function with trivial central AI. There was mild MR and TR. LV and RV function were good. Heparin was fully reversed with protamine.   Unfortunately over the next 15 minutes the patient developed progressive hypoxemia with sats into the 80's, then 70's with copious frothy fluid from the ETT despite normal PA pressures and low CVP. When the sats dropped into the 60's he began to have a declining BP and we decided to go back on pump and consider ECMO. The patient was heparinized and the venous cannula returned to the right atrium. CPB was reestablished. Dr. Gala Romney came to the OR and we decided to use [facility] ECMO to support the circulation since he was vasoplegic during the case. We kept the aortic and  right atrial cannula in and then weaned from bypass and converted to [facility] ECMO.   The patient was very coagulopathic and was given cryoprecipitate and platelets and FFP to correct laboratory abnormalities.  He continued to ooze from needle holes and soft tissues and I gave him a half dose of Novo Seven. Hemostasis was achieved. Mediastinal  and bilateral pleural drainage tubes were placed. The sternum was left open. An Esmark dressing was sewn to the wound edges and covered with an iodine drape.  All sponge, needle, and instrument counts were reported correct at the end of the case. The chest tubes were connected to pleurevac suction. The patient was then transported to the surgical intensive care unit in critical but stable condition.

## 2023-05-21 ENCOUNTER — Inpatient Hospital Stay (HOSPITAL_COMMUNITY): Payer: PPO

## 2023-05-21 ENCOUNTER — Encounter (HOSPITAL_COMMUNITY): Payer: Self-pay | Admitting: Surgery

## 2023-05-21 DIAGNOSIS — R570 Cardiogenic shock: Secondary | ICD-10-CM

## 2023-05-21 DIAGNOSIS — D649 Anemia, unspecified: Secondary | ICD-10-CM | POA: Diagnosis not present

## 2023-05-21 LAB — RENAL FUNCTION PANEL
Albumin: 2.6 g/dL — ABNORMAL LOW (ref 3.5–5.0)
Albumin: 2.9 g/dL — ABNORMAL LOW (ref 3.5–5.0)
Anion gap: 12 (ref 5–15)
Anion gap: 10 (ref 5–15)
BUN: 30 mg/dL — ABNORMAL HIGH (ref 8–23)
BUN: 22 mg/dL (ref 8–23)
CO2: 21 mmol/L — ABNORMAL LOW (ref 22–32)
CO2: 24 mmol/L (ref 22–32)
Calcium: 7.8 mg/dL — ABNORMAL LOW (ref 8.9–10.3)
Calcium: 7.8 mg/dL — ABNORMAL LOW (ref 8.9–10.3)
Chloride: 107 mmol/L (ref 98–111)
Chloride: 105 mmol/L (ref 98–111)
Creatinine, Ser: 1.89 mg/dL — ABNORMAL HIGH (ref 0.61–1.24)
Creatinine, Ser: 1.56 mg/dL — ABNORMAL HIGH (ref 0.61–1.24)
GFR, Estimated: 39 mL/min — ABNORMAL LOW (ref >60)
GFR, Estimated: 50 mL/min — ABNORMAL LOW (ref >60)
Glucose, Bld: 123 mg/dL — ABNORMAL HIGH (ref 70–99)
Glucose, Bld: 147 mg/dL — ABNORMAL HIGH (ref 70–99)
Phosphorus: 5.4 mg/dL — ABNORMAL HIGH (ref 2.5–4.6)
Phosphorus: 4.6 mg/dL (ref 2.5–4.6)
Potassium: 5.3 mmol/L — ABNORMAL HIGH (ref 3.5–5.1)
Potassium: 4.7 mmol/L (ref 3.5–5.1)
Sodium: 140 mmol/L (ref 135–145)
Sodium: 139 mmol/L (ref 135–145)

## 2023-05-21 LAB — APTT
aPTT: 52 s — ABNORMAL HIGH (ref 24–36)
aPTT: 37 s — ABNORMAL HIGH (ref 24–36)
aPTT: 37 s — ABNORMAL HIGH (ref 24–36)

## 2023-05-21 LAB — POCT I-STAT 7, (LYTES, BLD GAS, ICA,H+H)
Acid-Base Excess: 0 mmol/L (ref 0.0–2.0)
Acid-Base Excess: 0 mmol/L (ref 0.0–2.0)
Acid-Base Excess: 0 mmol/L (ref 0.0–2.0)
Acid-Base Excess: 1 mmol/L (ref 0.0–2.0)
Acid-Base Excess: 0 mmol/L (ref 0.0–2.0)
Acid-Base Excess: 0 mmol/L (ref 0.0–2.0)
Acid-base deficit: 1 mmol/L (ref 0.0–2.0)
Acid-base deficit: 1 mmol/L (ref 0.0–2.0)
Acid-base deficit: 1 mmol/L (ref 0.0–2.0)
Acid-base deficit: 1 mmol/L (ref 0.0–2.0)
Acid-base deficit: 1 mmol/L (ref 0.0–2.0)
Acid-base deficit: 1 mmol/L (ref 0.0–2.0)
Acid-base deficit: 1 mmol/L (ref 0.0–2.0)
Acid-base deficit: 1 mmol/L (ref 0.0–2.0)
Bicarbonate: 23.9 mmol/L (ref 20.0–28.0)
Bicarbonate: 24.2 mmol/L (ref 20.0–28.0)
Bicarbonate: 24.4 mmol/L (ref 20.0–28.0)
Bicarbonate: 24.7 mmol/L (ref 20.0–28.0)
Bicarbonate: 24.7 mmol/L (ref 20.0–28.0)
Bicarbonate: 24.8 mmol/L (ref 20.0–28.0)
Bicarbonate: 24.8 mmol/L (ref 20.0–28.0)
Bicarbonate: 25 mmol/L (ref 20.0–28.0)
Bicarbonate: 25.2 mmol/L (ref 20.0–28.0)
Bicarbonate: 25.3 mmol/L (ref 20.0–28.0)
Bicarbonate: 25.4 mmol/L (ref 20.0–28.0)
Bicarbonate: 25.4 mmol/L (ref 20.0–28.0)
Bicarbonate: 25.9 mmol/L (ref 20.0–28.0)
Bicarbonate: 26.1 mmol/L (ref 20.0–28.0)
Calcium, Ion: 1.05 mmol/L — ABNORMAL LOW (ref 1.15–1.40)
Calcium, Ion: 1.07 mmol/L — ABNORMAL LOW (ref 1.15–1.40)
Calcium, Ion: 1.07 mmol/L — ABNORMAL LOW (ref 1.15–1.40)
Calcium, Ion: 1.08 mmol/L — ABNORMAL LOW (ref 1.15–1.40)
Calcium, Ion: 1.08 mmol/L — ABNORMAL LOW (ref 1.15–1.40)
Calcium, Ion: 1.09 mmol/L — ABNORMAL LOW (ref 1.15–1.40)
Calcium, Ion: 1.09 mmol/L — ABNORMAL LOW (ref 1.15–1.40)
Calcium, Ion: 1.09 mmol/L — ABNORMAL LOW (ref 1.15–1.40)
Calcium, Ion: 1.1 mmol/L — ABNORMAL LOW (ref 1.15–1.40)
Calcium, Ion: 1.1 mmol/L — ABNORMAL LOW (ref 1.15–1.40)
Calcium, Ion: 1.1 mmol/L — ABNORMAL LOW (ref 1.15–1.40)
Calcium, Ion: 1.11 mmol/L — ABNORMAL LOW (ref 1.15–1.40)
Calcium, Ion: 1.11 mmol/L — ABNORMAL LOW (ref 1.15–1.40)
Calcium, Ion: 1.11 mmol/L — ABNORMAL LOW (ref 1.15–1.40)
HCT: 20 % — ABNORMAL LOW (ref 39.0–52.0)
HCT: 21 % — ABNORMAL LOW (ref 39.0–52.0)
HCT: 21 % — ABNORMAL LOW (ref 39.0–52.0)
HCT: 22 % — ABNORMAL LOW (ref 39.0–52.0)
HCT: 22 % — ABNORMAL LOW (ref 39.0–52.0)
HCT: 23 % — ABNORMAL LOW (ref 39.0–52.0)
HCT: 23 % — ABNORMAL LOW (ref 39.0–52.0)
HCT: 23 % — ABNORMAL LOW (ref 39.0–52.0)
HCT: 24 % — ABNORMAL LOW (ref 39.0–52.0)
HCT: 24 % — ABNORMAL LOW (ref 39.0–52.0)
HCT: 25 % — ABNORMAL LOW (ref 39.0–52.0)
HCT: 25 % — ABNORMAL LOW (ref 39.0–52.0)
HCT: 26 % — ABNORMAL LOW (ref 39.0–52.0)
HCT: 26 % — ABNORMAL LOW (ref 39.0–52.0)
Hemoglobin: 6.8 g/dL — CL (ref 13.0–17.0)
Hemoglobin: 7.1 g/dL — ABNORMAL LOW (ref 13.0–17.0)
Hemoglobin: 7.1 g/dL — ABNORMAL LOW (ref 13.0–17.0)
Hemoglobin: 7.5 g/dL — ABNORMAL LOW (ref 13.0–17.0)
Hemoglobin: 7.5 g/dL — ABNORMAL LOW (ref 13.0–17.0)
Hemoglobin: 7.8 g/dL — ABNORMAL LOW (ref 13.0–17.0)
Hemoglobin: 7.8 g/dL — ABNORMAL LOW (ref 13.0–17.0)
Hemoglobin: 7.8 g/dL — ABNORMAL LOW (ref 13.0–17.0)
Hemoglobin: 8.2 g/dL — ABNORMAL LOW (ref 13.0–17.0)
Hemoglobin: 8.2 g/dL — ABNORMAL LOW (ref 13.0–17.0)
Hemoglobin: 8.5 g/dL — ABNORMAL LOW (ref 13.0–17.0)
Hemoglobin: 8.5 g/dL — ABNORMAL LOW (ref 13.0–17.0)
Hemoglobin: 8.8 g/dL — ABNORMAL LOW (ref 13.0–17.0)
Hemoglobin: 8.8 g/dL — ABNORMAL LOW (ref 13.0–17.0)
O2 Saturation: 100 %
O2 Saturation: 100 %
O2 Saturation: 100 %
O2 Saturation: 100 %
O2 Saturation: 100 %
O2 Saturation: 100 %
O2 Saturation: 100 %
O2 Saturation: 100 %
O2 Saturation: 100 %
O2 Saturation: 100 %
O2 Saturation: 100 %
O2 Saturation: 100 %
O2 Saturation: 98 %
O2 Saturation: 99 %
Patient temperature: 36.5
Patient temperature: 36.5
Patient temperature: 36.6
Patient temperature: 36.6
Patient temperature: 36.7
Patient temperature: 36.7
Patient temperature: 36.7
Patient temperature: 36.7
Patient temperature: 36.7
Patient temperature: 36.7
Patient temperature: 36.7
Patient temperature: 36.8
Patient temperature: 36.8
Patient temperature: 36.9
Potassium: 4.2 mmol/L (ref 3.5–5.1)
Potassium: 4.4 mmol/L (ref 3.5–5.1)
Potassium: 4.5 mmol/L (ref 3.5–5.1)
Potassium: 4.6 mmol/L (ref 3.5–5.1)
Potassium: 4.7 mmol/L (ref 3.5–5.1)
Potassium: 5.3 mmol/L — ABNORMAL HIGH (ref 3.5–5.1)
Potassium: 5.4 mmol/L — ABNORMAL HIGH (ref 3.5–5.1)
Potassium: 5.5 mmol/L — ABNORMAL HIGH (ref 3.5–5.1)
Potassium: 5.5 mmol/L — ABNORMAL HIGH (ref 3.5–5.1)
Potassium: 5.6 mmol/L — ABNORMAL HIGH (ref 3.5–5.1)
Potassium: 5.7 mmol/L — ABNORMAL HIGH (ref 3.5–5.1)
Potassium: 5.7 mmol/L — ABNORMAL HIGH (ref 3.5–5.1)
Potassium: 5.7 mmol/L — ABNORMAL HIGH (ref 3.5–5.1)
Potassium: 5.9 mmol/L — ABNORMAL HIGH (ref 3.5–5.1)
Sodium: 137 mmol/L (ref 135–145)
Sodium: 137 mmol/L (ref 135–145)
Sodium: 138 mmol/L (ref 135–145)
Sodium: 138 mmol/L (ref 135–145)
Sodium: 139 mmol/L (ref 135–145)
Sodium: 139 mmol/L (ref 135–145)
Sodium: 139 mmol/L (ref 135–145)
Sodium: 139 mmol/L (ref 135–145)
Sodium: 140 mmol/L (ref 135–145)
Sodium: 140 mmol/L (ref 135–145)
Sodium: 140 mmol/L (ref 135–145)
Sodium: 140 mmol/L (ref 135–145)
Sodium: 140 mmol/L (ref 135–145)
Sodium: 141 mmol/L (ref 135–145)
TCO2: 25 mmol/L (ref 22–32)
TCO2: 26 mmol/L (ref 22–32)
TCO2: 26 mmol/L (ref 22–32)
TCO2: 26 mmol/L (ref 22–32)
TCO2: 26 mmol/L (ref 22–32)
TCO2: 26 mmol/L (ref 22–32)
TCO2: 26 mmol/L (ref 22–32)
TCO2: 26 mmol/L (ref 22–32)
TCO2: 27 mmol/L (ref 22–32)
TCO2: 27 mmol/L (ref 22–32)
TCO2: 27 mmol/L (ref 22–32)
TCO2: 27 mmol/L (ref 22–32)
TCO2: 27 mmol/L (ref 22–32)
TCO2: 27 mmol/L (ref 22–32)
pCO2 arterial: 37.1 mmHg (ref 32–48)
pCO2 arterial: 39.5 mmHg (ref 32–48)
pCO2 arterial: 41.8 mmHg (ref 32–48)
pCO2 arterial: 41.9 mmHg (ref 32–48)
pCO2 arterial: 42.1 mmHg (ref 32–48)
pCO2 arterial: 43 mmHg (ref 32–48)
pCO2 arterial: 43.6 mmHg (ref 32–48)
pCO2 arterial: 43.9 mmHg (ref 32–48)
pCO2 arterial: 44.3 mmHg (ref 32–48)
pCO2 arterial: 44.4 mmHg (ref 32–48)
pCO2 arterial: 45.2 mmHg (ref 32–48)
pCO2 arterial: 45.4 mmHg (ref 32–48)
pCO2 arterial: 45.7 mmHg (ref 32–48)
pCO2 arterial: 46.5 mmHg (ref 32–48)
pH, Arterial: 7.335 — ABNORMAL LOW (ref 7.35–7.45)
pH, Arterial: 7.349 — ABNORMAL LOW (ref 7.35–7.45)
pH, Arterial: 7.355 (ref 7.35–7.45)
pH, Arterial: 7.357 (ref 7.35–7.45)
pH, Arterial: 7.358 (ref 7.35–7.45)
pH, Arterial: 7.36 (ref 7.35–7.45)
pH, Arterial: 7.365 (ref 7.35–7.45)
pH, Arterial: 7.367 (ref 7.35–7.45)
pH, Arterial: 7.368 (ref 7.35–7.45)
pH, Arterial: 7.376 (ref 7.35–7.45)
pH, Arterial: 7.387 (ref 7.35–7.45)
pH, Arterial: 7.388 (ref 7.35–7.45)
pH, Arterial: 7.389 (ref 7.35–7.45)
pH, Arterial: 7.425 (ref 7.35–7.45)
pO2, Arterial: 106 mmHg (ref 83–108)
pO2, Arterial: 159 mmHg — ABNORMAL HIGH (ref 83–108)
pO2, Arterial: 242 mmHg — ABNORMAL HIGH (ref 83–108)
pO2, Arterial: 242 mmHg — ABNORMAL HIGH (ref 83–108)
pO2, Arterial: 254 mmHg — ABNORMAL HIGH (ref 83–108)
pO2, Arterial: 259 mmHg — ABNORMAL HIGH (ref 83–108)
pO2, Arterial: 298 mmHg — ABNORMAL HIGH (ref 83–108)
pO2, Arterial: 421 mmHg — ABNORMAL HIGH (ref 83–108)
pO2, Arterial: 447 mmHg — ABNORMAL HIGH (ref 83–108)
pO2, Arterial: 467 mmHg — ABNORMAL HIGH (ref 83–108)
pO2, Arterial: 467 mmHg — ABNORMAL HIGH (ref 83–108)
pO2, Arterial: 468 mmHg — ABNORMAL HIGH (ref 83–108)
pO2, Arterial: 493 mmHg — ABNORMAL HIGH (ref 83–108)
pO2, Arterial: 514 mmHg — ABNORMAL HIGH (ref 83–108)

## 2023-05-21 LAB — CG4 I-STAT (LACTIC ACID)
Lactic Acid, Venous: 2.1 mmol/L (ref 0.5–1.9)
Lactic Acid, Venous: 1.3 mmol/L (ref 0.5–1.9)
Lactic Acid, Venous: 1.2 mmol/L (ref 0.5–1.9)

## 2023-05-21 LAB — CBC
HCT: 25.8 % — ABNORMAL LOW (ref 39.0–52.0)
HCT: 24.3 % — ABNORMAL LOW (ref 39.0–52.0)
Hemoglobin: 8.6 g/dL — ABNORMAL LOW (ref 13.0–17.0)
Hemoglobin: 8.3 g/dL — ABNORMAL LOW (ref 13.0–17.0)
MCH: 28.6 pg (ref 26.0–34.0)
MCH: 29.2 pg (ref 26.0–34.0)
MCHC: 33.3 g/dL (ref 30.0–36.0)
MCHC: 34.2 g/dL (ref 30.0–36.0)
MCV: 85.7 fL (ref 80.0–100.0)
MCV: 85.6 fL (ref 80.0–100.0)
Platelets: 81 10*3/uL — ABNORMAL LOW (ref 150–400)
Platelets: 93 10*3/uL — ABNORMAL LOW (ref 150–400)
RBC: 3.01 MIL/uL — ABNORMAL LOW (ref 4.22–5.81)
RBC: 2.84 MIL/uL — ABNORMAL LOW (ref 4.22–5.81)
RDW: 17.4 % — ABNORMAL HIGH (ref 11.5–15.5)
RDW: 17.4 % — ABNORMAL HIGH (ref 11.5–15.5)
WBC: 18.9 10*3/uL — ABNORMAL HIGH (ref 4.0–10.5)
WBC: 24.1 10*3/uL — ABNORMAL HIGH (ref 4.0–10.5)
nRBC: 1.2 % — ABNORMAL HIGH (ref 0.0–0.2)
nRBC: 0.8 % — ABNORMAL HIGH (ref 0.0–0.2)

## 2023-05-21 LAB — GLUCOSE, CAPILLARY
Glucose-Capillary: 105 mg/dL — ABNORMAL HIGH (ref 70–99)
Glucose-Capillary: 109 mg/dL — ABNORMAL HIGH (ref 70–99)
Glucose-Capillary: 111 mg/dL — ABNORMAL HIGH (ref 70–99)
Glucose-Capillary: 113 mg/dL — ABNORMAL HIGH (ref 70–99)
Glucose-Capillary: 115 mg/dL — ABNORMAL HIGH (ref 70–99)
Glucose-Capillary: 116 mg/dL — ABNORMAL HIGH (ref 70–99)
Glucose-Capillary: 129 mg/dL — ABNORMAL HIGH (ref 70–99)
Glucose-Capillary: 132 mg/dL — ABNORMAL HIGH (ref 70–99)
Glucose-Capillary: 136 mg/dL — ABNORMAL HIGH (ref 70–99)
Glucose-Capillary: 144 mg/dL — ABNORMAL HIGH (ref 70–99)
Glucose-Capillary: 150 mg/dL — ABNORMAL HIGH (ref 70–99)
Glucose-Capillary: 150 mg/dL — ABNORMAL HIGH (ref 70–99)

## 2023-05-21 LAB — BASIC METABOLIC PANEL
Anion gap: 12 (ref 5–15)
BUN: 29 mg/dL — ABNORMAL HIGH (ref 8–23)
CO2: 22 mmol/L (ref 22–32)
Calcium: 7.8 mg/dL — ABNORMAL LOW (ref 8.9–10.3)
Chloride: 106 mmol/L (ref 98–111)
Creatinine, Ser: 1.91 mg/dL — ABNORMAL HIGH (ref 0.61–1.24)
GFR, Estimated: 39 mL/min — ABNORMAL LOW (ref >60)
Glucose, Bld: 124 mg/dL — ABNORMAL HIGH (ref 70–99)
Potassium: 5.3 mmol/L — ABNORMAL HIGH (ref 3.5–5.1)
Sodium: 140 mmol/L (ref 135–145)

## 2023-05-21 LAB — COOXEMETRY PANEL
Carboxyhemoglobin: 3.4 % — ABNORMAL HIGH (ref 0.5–1.5)
Methemoglobin: <0.7 % (ref 0.0–1.5)
O2 Saturation: 100 %
Total hemoglobin: 9.4 g/dL — ABNORMAL LOW (ref 12.0–16.0)

## 2023-05-21 LAB — HAPTOGLOBIN: Haptoglobin: 103 mg/dL (ref 32–363)

## 2023-05-21 LAB — HEPARIN LEVEL (UNFRACTIONATED): Heparin Unfractionated: <0.1 [IU]/mL — ABNORMAL LOW (ref 0.30–0.70)

## 2023-05-21 LAB — PREPARE RBC (CROSSMATCH)

## 2023-05-21 LAB — PROTIME-INR
INR: 1.3 — ABNORMAL HIGH (ref 0.8–1.2)
Prothrombin Time: 16.8 s — ABNORMAL HIGH (ref 11.4–15.2)

## 2023-05-21 LAB — LACTATE DEHYDROGENASE: LDH: 775 U/L — ABNORMAL HIGH (ref 98–192)

## 2023-05-21 LAB — ECHO INTRAOPERATIVE TEE
Height: 66 in
Weight: 2991.2 [oz_av]

## 2023-05-21 LAB — HEMOGLOBIN A1C
Hgb A1c MFr Bld: 6.1 % — ABNORMAL HIGH (ref 4.8–5.6)
Mean Plasma Glucose: 128 mg/dL

## 2023-05-21 LAB — FIBRINOGEN: Fibrinogen: 209 mg/dL — ABNORMAL LOW (ref 210–475)

## 2023-05-21 LAB — MAGNESIUM: Magnesium: 2.3 mg/dL (ref 1.7–2.4)

## 2023-05-21 MED ORDER — DEXTROSE 50 % IV SOLN: 0.0000 mL | INTRAVENOUS | Status: DC | PRN

## 2023-05-21 MED ORDER — OXYCODONE HCL 5 MG PO TABS: 5.0000 mg | ORAL_TABLET | ORAL | Status: DC | PRN

## 2023-05-21 MED ORDER — SODIUM CHLORIDE 0.9% IV SOLUTION: Freq: Once | INTRAVENOUS | Status: DC

## 2023-05-21 MED ORDER — PRISMASOL BGK 0/2.5 32-2.5 MEQ/L EC SOLN
Status: DC
  Filled 2023-05-21 (×18): qty 5000

## 2023-05-21 MED ORDER — INSULIN REGULAR(HUMAN) IN NACL 100-0.9 UT/100ML-% IV SOLN: INTRAVENOUS | Status: DC

## 2023-05-21 MED ORDER — SODIUM CHLORIDE 0.9 % IV SOLN: INTRAVENOUS | Status: DC

## 2023-05-21 MED ORDER — INSULIN ASPART 100 UNIT/ML IJ SOLN
0.0000 [IU] | INTRAMUSCULAR | Status: DC
  Administered 2023-05-21 – 2023-05-23 (×10): 2 [IU] via SUBCUTANEOUS
  Administered 2023-05-24: 3 [IU] via SUBCUTANEOUS
  Administered 2023-05-24: 2 [IU] via SUBCUTANEOUS
  Administered 2023-05-24: 3 [IU] via SUBCUTANEOUS
  Administered 2023-05-24: 2 [IU] via SUBCUTANEOUS
  Administered 2023-05-24: 3 [IU] via SUBCUTANEOUS
  Administered 2023-05-25: 2 [IU] via SUBCUTANEOUS
  Administered 2023-05-25 (×3): 5 [IU] via SUBCUTANEOUS
  Administered 2023-05-25: 3 [IU] via SUBCUTANEOUS
  Administered 2023-05-25 – 2023-05-28 (×7): 2 [IU] via SUBCUTANEOUS
  Administered 2023-05-28: 3 [IU] via SUBCUTANEOUS
  Administered 2023-05-28 – 2023-05-29 (×3): 2 [IU] via SUBCUTANEOUS
  Administered 2023-05-29 (×3): 3 [IU] via SUBCUTANEOUS
  Administered 2023-05-29: 2 [IU] via SUBCUTANEOUS
  Administered 2023-05-30: 3 [IU] via SUBCUTANEOUS
  Administered 2023-05-30 (×2): 2 [IU] via SUBCUTANEOUS
  Administered 2023-05-30: 1 [IU] via SUBCUTANEOUS
  Administered 2023-05-30 (×2): 2 [IU] via SUBCUTANEOUS
  Administered 2023-05-31 (×5): 3 [IU] via SUBCUTANEOUS
  Administered 2023-06-01 – 2023-06-02 (×6): 5 [IU] via SUBCUTANEOUS
  Administered 2023-06-02 (×2): 3 [IU] via SUBCUTANEOUS

## 2023-05-21 MED ORDER — EZETIMIBE 10 MG PO TABS
10.0000 mg | ORAL_TABLET | Freq: Every day | ORAL | Status: DC
  Administered 2023-05-22 – 2023-06-01 (×9): 10 mg
  Filled 2023-05-21 (×9): qty 1

## 2023-05-21 MED ORDER — ARTIFICIAL TEARS OPHTHALMIC OINT
TOPICAL_OINTMENT | OPHTHALMIC | Status: DC | PRN
  Administered 2023-05-21: 1 via OPHTHALMIC
  Filled 2023-05-21 (×2): qty 3.5

## 2023-05-21 MED ORDER — PANTOPRAZOLE SODIUM 40 MG IV SOLR: 40.0000 mg | Freq: Every day | INTRAVENOUS | Status: DC

## 2023-05-21 MED ORDER — ATORVASTATIN CALCIUM 80 MG PO TABS
80.0000 mg | ORAL_TABLET | Freq: Every day | ORAL | Status: DC
  Administered 2023-05-21 – 2023-05-29 (×8): 80 mg
  Filled 2023-05-21 (×8): qty 1

## 2023-05-21 MED ORDER — CHLORHEXIDINE GLUCONATE CLOTH 2 % EX PADS
6.0000 | MEDICATED_PAD | Freq: Every day | CUTANEOUS | Status: DC
  Administered 2023-05-21 – 2023-06-11 (×22): 6 via TOPICAL

## 2023-05-21 MED ORDER — DEXTROSE-SODIUM CHLORIDE 5-0.45 % IV SOLN: INTRAVENOUS | Status: DC

## 2023-05-21 MED ORDER — TRAMADOL HCL 50 MG PO TABS: 50.0000 mg | ORAL_TABLET | ORAL | Status: DC | PRN

## 2023-05-21 MED ORDER — PRISMASOL BGK 0/2.5 32-2.5 MEQ/L EC SOLN
Status: DC
  Filled 2023-05-21 (×5): qty 5000

## 2023-05-21 MED ORDER — PANTOPRAZOLE SODIUM 40 MG IV SOLR
40.0000 mg | Freq: Every day | INTRAVENOUS | Status: DC
  Administered 2023-05-21 – 2023-06-11 (×22): 40 mg via INTRAVENOUS
  Filled 2023-05-21 (×22): qty 10

## 2023-05-21 NOTE — Progress Notes (Signed)
Initial Nutrition Assessment  DOCUMENTATION CODES:   Not applicable  INTERVENTION:  Prior to initiation of trickle tube feeds, recommend verifying placement of OG tube with abdominal x-ray.  Once appropriate for initiation of trickle tube feeds via OG tube recommend: -Initiate Vital 1.5 at 20 mL/hour -While on trickle tube feeds, provide PROSource TF20 60 mL every 4 hours to help meet protein needs. Each packet provides 80 kcal and 20 grams of protein.  Once able to initiate tube feeds, provide Rena-vite per tube at bedtime.   Goal tube feed regimen: -Vital 1.5 at 60 mL/hour (1440 mL goal daily volume) -PROSource TF20 60 mL BID per tube -Provides: 2320 kcal, 137 grams of protein, 1094 mL H2O daily  NUTRITION DIAGNOSIS:   Inadequate oral intake related to inability to eat as evidenced by NPO status.  GOAL:   Patient will meet greater than or equal to 90% of their needs  MONITOR:   Vent status, Labs, Weight trends, TF tolerance, Skin, I & O's  REASON FOR ASSESSMENT:   Consult Assessment of nutrition requirement/status  ASSESSMENT:   62 year old male with PMHx of CAD s/p CABG 2017 with AVR, HLD, HTN, prior CVA, T2DM, CKD 4 was previously on HD. Recent admission on 5/25 with AKI and AMS, had PEA arrest with ROSC in 8 minutes, required dialysis, also with MSSA bacteremia associated with tricuspid valve endocarditis, suffered right cerebellar CVA and had cholangitis/pancreatitis requiring ERCP. 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  9/10: admitted 9/11: echo showing aortic valve dehiscence, confirmed by TEE 9/11: CRRT started for volume overload and known CKD 4 9/13: to OR for redo of aortic valve, tricuspid valve repair and ascending aortic root replacement with re-implantation of SVG to RCA; could not come off CPB, cannulated and started on [facility] ECMO with central cannulation  Pt currently intubated and heavily sedated. Currently  with open chest per MD note. Developing anasarca. On CRRT with UF as tolerated by BP and ECMO. Discussed with MD via secure chat. Plan is to hold off on starting trickle tube feeds today but may consider starting trickle tube feeds tomorrow.  Per review of chart pt was 95.7 kg on 03/30/23. Weights trending down in chart. 89.5 kg on admission. Currently 83.1 kg. Overall current wt 12.6 kg or 13.2% lower than wt on 7/24. Unsure how much may be related to fluid status.  Patient is currently intubated on ventilator support MV: 6.7 L/min Temp (24hrs), Avg:97.3 F (36.3 C), Min:96.8 F (36 C), Max:98.8 F (37.1 C)  Medications reviewed and include: Dulcolax, Colace 100 mg BID, Novolog 0-15 units Q4hrs, pantoprazole, Miralax 17 grams daily, human albumin 5% every 15 minutes PRN, ceftriaxone, Precedex gtt, epinephrine gtt at 5 mcg/min, fentanyl gtt, regular insulin gtt, Versed gtt, norepinephrine gtt (stopped this AM), vancomycin, vasopressin gtt at 0.02 units/min  Labs reviewed: Potassium 5.3, CO2 21, BUN 29, Creatinine 1.91  Enteral Access: 16 Fr. OGT placed 9/14; location of tube not mentioned on chest x-ray 9/14 Discussed with MD via secure chat but plan is to hold off on initiation of trickle tube feeds today.  UOP: 75 mL UOP in previous 24 hrs  Chest tube: 655 mL output in previous 24 hrs  CRRT: 497 mL removed in previous 24 hrs  I/O: +1786.2 mL since admission  NUTRITION - FOCUSED PHYSICAL EXAM:  Unable to complete as RD is working remotely.  Diet Order:   Diet Order  Diet NPO time specified  Diet effective now                  EDUCATION NEEDS:   No education needs have been identified at this time  Skin:  Skin Assessment: Skin Integrity Issues: Skin Integrity Issues:: Incisions, Other (Comment) Incisions: closed incision left leg Other: open chest  Last BM:  05/19/23 - small type 6  Height:   Ht Readings from Last 1 Encounters:  05/20/23 5\' 6"   (1.676 m)   Weight:   Wt Readings from Last 1 Encounters:  05/21/23 83.1 kg   Ideal Body Weight:  64.5 kg  BMI:  Body mass index is 29.57 kg/m.  Estimated Nutritional Needs:   Kcal:  2200-2400  Protein:  130-150 grams  Fluid:  2 L/day  Letta Median, MS, RD, LDN, CNSC Pager number available on Amion

## 2023-05-21 NOTE — Progress Notes (Signed)
Patient ID: [name], male   DOB: 03-29-1961, 62 y.o.   MRN: [id number] Extracorporeal support note   ECLS support day: Indication: Post-op vasoplegia and pulmonary edema  Configuration: [facility] ECMO  Drainage cannula: RA Return cannula: Ascending aorta  Pump speed: 3100 rpm  Pump flow: 4 L/min Pump used: Cardiohelp  Sweep gas: 4  Circuit check: No thrombus Anticoagulant: None, post-op Anticoagulation targets: No anticoagulation yet  Changes in support: none  Anticipated goals/duration of support: Wean to decannulation  .[name]  05/21/2023, 7:25 AM

## 2023-05-21 NOTE — Progress Notes (Addendum)
Patient ID: [name], male   DOB: [birth year], 62 y.o.   MRN: [id number]     Advanced Heart Failure Rounding Note  PCP-Cardiologist: Kristeen Miss, MD   Subjective:    9/13: OR for Bentall/AVR, TV repair, re-implantation of SVG-RCA.  Post-op vasoplegia and pulmonary edema, required central VA ECMO (RA drainage, ascending aorta return.  Post-op TEE EF 55%, mild-moderate RV hypokinesis.   MAP 90s on epinephrine 5 and vasopressin 0.02.  Currently on CVVH running even to mildly positive with chugging.   H/o MSSA endocarditis, currently on ceftriaxone/vancomycin.   Chest tube oozing.  Plts 81, hgb 8.6.  Had 2 units PRBCs overnight and FFP, 2 cryo.   Swan in appropriate position by CXR but not functional.  CXR with diffuse bilateral pulmonary edema.   A-V sequential pacing with no underlying rhythm  VA ECMO 3100 rpm Flow 4 L/min pVen -38 Delta P 21 Sweep 4 LDH 775 PTT 52 Lactate 1.3 ABG 7.36/45/468   Objective:   Weight Range: 83.1 kg Body mass index is 29.57 kg/m.   Vital Signs:   Temp:  [96.8 F (36 C)-97.5 F (36.4 C)] 97 F (36.1 C) (09/14 0700) Pulse Rate:  [79-173] 80 (09/14 0700) Resp:  [0-22] 0 (09/14 0700) SpO2:  [100 %] 100 % (09/14 0700) Arterial Line BP: (67-105)/(63-98) 105/98 (09/14 0700) FiO2 (%):  [50 %] 50 % (09/14 0343) Weight:  [83.1 kg] 83.1 kg (09/14 0615) Last BM Date : 05/18/23  Weight change: Filed Weights   05/19/23 0626 05/12/2023 0545 05/21/23 0615  Weight: 88.2 kg 84.8 kg 83.1 kg    Intake/Output:   Intake/Output Summary (Last 24 hours) at 05/21/2023 0728 Last data filed at 05/21/2023 0700 Gross per 24 hour  Intake 7975.27 ml  Output 1227 ml  Net 6748.27 ml      Physical Exam    General:  Intubated on vent HEENT: Normal Neck: Supple. JVP elevated.  Cor: PMI nondisplaced. Regular rate & rhythm. No rubs, gallops or murmurs. Lungs: Clear Abdomen: Soft, mildly distended. No hepatosplenomegaly. No bruits or masses.   Extremities: No cyanosis, clubbing, rash, edema Neuro: Sedated on vent   Telemetry   A-V sequential pacing (personally reviewed)   Labs    CBC Recent Labs    05/19/23 0636 05/25/2023 0309 05/15/2023 2118 05/21/23 0014 05/21/23 0340 05/21/23 0613  WBC 10.8*   < > 25.0*  --  18.9*  --   NEUTROABS 5.5  --   --   --   --   --   HGB 8.1*   < > 7.9*   < > 8.6* 7.1*  HCT 25.1*   < > 23.2*   < > 25.8* 21.0*  MCV 89.3   < > 86.9  --  85.7  --   PLT 114*   < > 117*  --  81*  --    < > = values in this interval not displayed.   Basic Metabolic Panel Recent Labs    08/65/78 2118 05/21/23 0014 05/21/23 0340 05/21/23 0613  NA 144   < > 140  140 140  K 5.5*   < > 5.3*  5.3* 5.7*  CL 110  --  106  107  --   CO2 21*  --  22  21*  --   GLUCOSE 112*  --  124*  123*  --   BUN 32*  --  29*  30*  --   CREATININE 1.99*  --  1.91*  1.89*  --  CALCIUM 8.1*  --  7.8*  7.8*  --   MG 2.4  --  2.3  --   PHOS 6.1*  --  5.4*  --    < > = values in this interval not displayed.   Liver Function Tests Recent Labs    05/29/2023 2118 05/21/23 0340  ALBUMIN <1.5* 2.6*   No results for input(s): "LIPASE", "AMYLASE" in the last 72 hours. Cardiac Enzymes No results for input(s): "CKTOTAL", "CKMB", "CKMBINDEX", "TROPONINI" in the last 72 hours.  BNP: BNP (last 3 results) Recent Labs    06/06/2023 1100  BNP 2,097.0*    ProBNP (last 3 results) No results for input(s): "PROBNP" in the last 8760 hours.   D-Dimer No results for input(s): "DDIMER" in the last 72 hours. Hemoglobin A1C Recent Labs    06/03/2023 0309  HGBA1C 6.1*   Fasting Lipid Panel No results for input(s): "CHOL", "HDL", "LDLCALC", "TRIG", "CHOLHDL", "LDLDIRECT" in the last 72 hours. Thyroid Function Tests No results for input(s): "TSH", "T4TOTAL", "T3FREE", "THYROIDAB" in the last 72 hours.  Invalid input(s): "FREET3"  Other results:   Imaging    DG Chest Port 1 View  Result Date: 05/10/2023 CLINICAL  DATA:  Status post AVR and tricuspid valve repair EXAM: PORTABLE CHEST 1 VIEW COMPARISON:  05/19/2023 FINDINGS: Postsurgical changes are noted. Cardiac shadow is stable. Swan-Ganz catheter is noted. Endotracheal tube is seen at the level of the carina and should be withdrawn 2 cm. Bilateral chest tubes as well as a pericardial and mediastinal drain are seen. Cannula is noted over the right atrium extending inferiorly. No pneumothorax is noted. Central vascular congestion is seen with edema noted bilaterally worse on the right than the left. No pneumothorax is seen. Cannulas are noted overlying the upper left chest with hemostats associated. IMPRESSION: Postsurgical changes as described. Central vascular congestion as well as pulmonary edema worse on the right than the left. No pneumothorax is seen. Endotracheal tube is at the level of the carina and should be withdrawn 2 cm. These results will be called to the ordering clinician or representative by the Radiologist Assistant, and communication documented in the PACS or [employer]. Electronically Signed   By: Alcide Clever M.D.   On: 05/10/2023 23:02   EP STUDY  Result Date: 05/28/2023 See surgical note for result.    Medications:     Scheduled Medications:  acetaminophen  1,000 mg Oral Q6H   Or   acetaminophen (TYLENOL) oral liquid 160 mg/5 mL  1,000 mg Per Tube Q6H   acetaminophen (TYLENOL) oral liquid 160 mg/5 mL  650 mg Per Tube Once   aspirin  324 mg Oral Once   aspirin EC  325 mg Oral Daily   Or   aspirin  324 mg Per Tube Daily   atorvastatin  80 mg Oral q1800   bisacodyl  10 mg Oral Daily   Or   bisacodyl  10 mg Rectal Daily   Chlorhexidine Gluconate Cloth  6 each Topical Daily   docusate  100 mg Per Tube BID   ezetimibe  10 mg Oral Daily   fentaNYL (SUBLIMAZE) injection  50 mcg Intravenous Once   metoCLOPramide (REGLAN) injection  10 mg Intravenous Q6H   metoprolol tartrate  12.5 mg Oral BID   Or   metoprolol tartrate  12.5  mg Per Tube BID   mupirocin ointment  1 Application Nasal BID   [START ON 05/22/2023] pantoprazole  40 mg Oral Daily   pantoprazole (PROTONIX) IV  40 mg Intravenous QHS   polyethylene glycol  17 g Per Tube Daily   sodium chloride flush  3 mL Intravenous Q12H    Infusions:   prismasol BGK 4/2.5 400 mL/hr at 05/21/2023 2142    prismasol BGK 4/2.5 400 mL/hr at 05/10/2023 2145   sodium chloride 20 mL/hr at 05/21/23 0700   sodium chloride     sodium chloride 20 mL/hr at 05/21/23 0700   albumin human 250 mL/hr at 05/21/23 0700   anticoagulant sodium citrate     cefTRIAXone (ROCEPHIN)  IV Stopped (05/19/23 2106)   dexmedetomidine (PRECEDEX) IV infusion 0.5 mcg/kg/hr (05/21/23 0700)   epinephrine 5 mcg/min (05/21/23 0700)   fentaNYL infusion INTRAVENOUS 200 mcg/hr (05/21/23 0700)   insulin 0.8 Units/hr (05/21/23 0700)   lactated ringers     lactated ringers 20 mL/hr at 05/21/23 0700   magnesium sulfate     midazolam 10 mg/hr (05/21/23 0700)   milrinone     norepinephrine (LEVOPHED) Adult infusion Stopped (05/21/23 0453)   prismasol BGK 4/2.5 1,000 mL/hr at 05/11/2023 2144   vancomycin Stopped (05/11/2023 2340)   vasopressin 0.02 Units/min (05/21/23 0700)    PRN Medications: sodium chloride, albumin human, anticoagulant sodium citrate, dextrose, fentaNYL, metoprolol tartrate, midazolam, midazolam, morphine injection, ondansetron (ZOFRAN) IV, oxyCODONE, sodium chloride flush, traMADol    Assessment/Plan   1. Post-cardiotomy vasoplegia/shock with failure to wean from CPB: Now on central VA ECMO (05/12/2023) with RA drainage/ascending aorta return. Post-op echo with EF 55%, mild-moderate RV dysfunction.  MAP 90s on epinephrine 5 + vasopressin 0.02.  CXR with diffuse pulmonary edema.  Currently CVVH running even to mildly positive.  - Wean pressors as able, should be able to come down today.  - Will decrease ECMO flow to 3.7 L/min to see if this will allow [place] to start to gently pull via CVVH.  -  Discussed with Dr. Laneta Simmers, no anticoagulation yet today for ECMO circuit, hopefully start bivalirudin tomorrow.  - Limited echo to reassess LV/RV and AoV.   - Decannulation once lungs more clear.  2. Acute hypoxic respiratory failure, post-op: Diffuse pulmonary edema.  On vent, rest settings.  CXR with diffuse pulmonary edema. PaO2 high, 468.  - As above, lowering ECMO flow to 3.7 L/min, will try to pull net negative gradually via CVVH.  - Discussed with Dr. Katrinka Blazing, will add blender to keep PaO2 100-200.  3. Severe prosthetic AI: Due to previous MSSA endocarditis with partial valve dehiscence s/p re-do AVR/Bentall with reimplantation of SVG-RCA and TV repair 06/01/2023  - Continuing ceftriaxone/vancomycin for MSSA coverage.  4. CAD s/p CABG/AVR 2017:  s/p AVR/Bentall and re-implantation of SVG-RCA 05/21/2023. 5. ESRD: CVVH as above.  6. DM2: management per CCM 7. Heart block: Post-op, now dependent on A-V sequential pacing.  8. H/o CVA: Prior cerebellar CVA.  9. Thrombocytopenia: Post-op.  Getting platelets this morning.  10. Anemia: Post-op, transfuse hgb < 8.   CRITICAL CARE Performed by: [name]  Total critical care time: 45 minutes  Critical care time was exclusive of separately billable procedures and treating other patients.  Critical care was necessary to treat or prevent imminent or life-threatening deterioration.  Critical care was time spent personally by me on the following activities: development of treatment plan with patient and/or surrogate as well as nursing, discussions with consultants, evaluation of patient's response to treatment, examination of patient, obtaining history from patient or surrogate, ordering and performing treatments and interventions, ordering and review of laboratory studies, ordering and review of  radiographic studies, pulse oximetry and re-evaluation of patient's condition.    Length of Stay: 3  [name], MD  05/21/2023, 7:28 AM  Advanced  Heart Failure Team Pager [phone] (M-F; 7a - 5p)  Please contact [facility] for night-coverage after hours (5p -7a ) and weekends on [email or web address]

## 2023-05-21 NOTE — Progress Notes (Addendum)
PT Cancellation Note  Patient Details Name: [name] MRN: [id number] DOB: [birth year]   Cancelled Treatment:    Reason Eval/Treat Not Completed: Patient not medically ready;Medical issues which prohibited therapy.  Heavily sedated post AVR redo, on ECMO.  Will check back Monday. 05/21/2023  Jacinto Halim., PT Acute Rehabilitation Services [phone]  (office)   [name] 05/21/2023, 1:57 PM

## 2023-05-21 NOTE — Progress Notes (Signed)
NAME:  [name], MRN:  [id number], DOB:  [birth year], LOS: 3 ADMISSION DATE:  05/29/2023, CONSULTATION DATE:  9/11 REFERRING MD:  Dr. Izora Ribas, CHIEF COMPLAINT:  aortic valve dehiscence   History of Present Illness:  Patient is [name] w/ pertinent PMH CAD s/p CABG 2017 w/ AVR (magna ease for bicuspid valve disease), HLD, HTN, prior CVA, T2DM, CKD4 was previously on HD presents to [facility] on 9/10 chest pain.  Patient recently admitted to [facility] on 5/25 w/ AKI and AMS. While in ED patient had PEA arrest w/ ROSC in about 8 minutes. Patient required dialysis on this admission. Also w/ MSSA bacteremia associated w/ tricuspid valve endocarditis. Patient transferred to [facility] on 5/25. Treated w/ 6 week course oxacillin and rifampin. This admission also suffered a R cerebellar CVA w/ MRI likely septic emboli and had cholangitis/pancreatitis requiring ERCP.  On 9/10 patient admitted to [facility] w/ chest pain, dyspnea, and BLE edema. BNP 2,097. CXR w/ pulm edema. Patient started on IV lasix infusion. Cards consulted. On 9/11 patient transferred to [facility]. Patient's echo showing possible aortic valve dehiscence. Cultures repeated and started on rocephin. Patient transferred to ICU for TEE and general anesthesia and to start CRRT. PCCM consulted.  Pertinent  Medical History   Past Medical History:  Diagnosis Date   Allergy    Anemia    Anxiety    Baker's cyst of knee    Blood transfusion without reported diagnosis    as baby    Coronary artery disease    quadruple bypass - March 2016   GERD (gastroesophageal reflux disease)    Gouty arthritis    "real bad" (01/17/2013)   Heart murmur    Hypercholesteremia    Hypertension    MSSA bacteremia 01/29/2023   Myocardial infarction (HCC) 2017   PEA (Pulseless electrical activity) (HCC) 01/29/2023   Stroke (HCC)    Type II diabetes mellitus (HCC)      Significant Hospital Events: Including procedures, antibiotic start and stop dates  in addition to other pertinent events   9/10 admitted 9/11 echo showing aortic valve dehiscence, confirmed by TEE.  9/11 started (back) on CRRT for volume overload and known CKD 4 9/12 breathing better after CRRT 9/13 to OR for Redo of aortic valve, tricuspid valve repair and ascending aortic root replacement w/ re-implantation of SVG to RCA. Post op TEE EF 55% RV mid-mod HK, AVR/TV repair stable. Could not come off CPB. Worse pulmonary edema. High dose pressors. Cannulated and started on VA ECMO w/ central cannulation   Interim History / Subjective:  Heavily sedated Needed fair bit of volume to maintain flows  Objective   Blood pressure 101/60, pulse 80, temperature 98.4 F (36.9 C), temperature source Core, resp. rate 16, height 5\' 6"  (1.676 m), weight 83.1 kg, SpO2 100%. PAP: (-1-22)/(-5-19) 0/-3 CVP:  [6 mmHg-15 mmHg] 10 mmHg  Vent Mode: SIMV;PRVC;PSV FiO2 (%):  [50 %] 50 % Set Rate:  [16 bmp] 16 bmp Vt Set:  [510 mL] 510 mL PEEP:  [5 cmH20] 5 cmH20 Pressure Support:  [10 cmH20] 10 cmH20 Plateau Pressure:  [25 cmH20-26 cmH20] 26 cmH20   Intake/Output Summary (Last 24 hours) at 05/21/2023 0917 Last data filed at 05/21/2023 0800 Gross per 24 hour  Intake 8325.11 ml  Output 1327 ml  Net 6998.11 ml   Filed Weights   05/19/23 0626 05/10/2023 0545 05/21/23 0615  Weight: 88.2 kg 84.8 kg 83.1 kg    Examination: Heavily sedated  Open chest ECMO circuit looks okay Paced Severely diminished breath sounds Vent mechanics: Plat 21, PEEP 5 = DP 16, 400cc TV which is ~6cc/kg Abd soft hypoactive BS Developing anasarca RASS -5  ABG looks except hyperoxia K a little up, nephro to adjust baths LDH 775 H/H okay, getting some plts  Fibrinogen 209 INR 1.3 PTT 52 CXR marked edema  Assessment & Plan:   Post cardiotomy vasoplegia/cardioplegia w/ failure to wean from CPB Acute on chronic combined HF  Hx MSSA endocarditis S/p AVR/Bentall and reimplantation of Coronaries and TV repair   Acute respiratory failure w/ hypoxia due to volume overload, with untreated OSA CKD 4. Had recently been on HD Circuit related hemolysis L>R BLE swelling ->LE [place] neg for DVT CAD s/p CABG in 2017 w/ AVR HTN HLD Hx of CVA DMT2  - Will place blender in ECMO circuit targeting paO2 100-200 - Pressors for mAP 65 - VA flows per TCTS/AHF, going to lower a bit to see if we can get closer to even - Abx as ordered - Rest settings on vent, target DP 15, compliance mostly driven by pulmonary edema  - CRRT targeting event to neg as able - Transfusion thresholds per TCTS - SSI  Best Practice (right click and "Reselect all SmartList Selections" daily)   Diet/type: Regular consistency (see orders) DVT prophylaxis: SCD and  heparin.  GI prophylaxis: N/A Lines: Central line and Dialysis Catheter Foley:  N/A Code Status:  full code Last date of multidisciplinary goals of care discussion [pending]  CRITICAL CARE 35 min  Performed by: Lorin Glass

## 2023-05-21 NOTE — Progress Notes (Signed)
Lab 05/21/23 0349 05/21/23 0459 05/21/23 0607 05/21/23 0702 05/21/23 0753  GLUCAP 115* 109* 111* 113* 116*    Iron Studies: No results for input(s): "IRON", "TIBC", "TRANSFERRIN", "FERRITIN" in the last 72 hours. Studies/Results: DG Chest Port 1 View  Result Date: 05/20/2023 CLINICAL DATA:  Status post AVR and tricuspid valve repair EXAM: PORTABLE CHEST 1 VIEW COMPARISON:  05/19/2023 FINDINGS: Postsurgical changes are noted. Cardiac shadow is stable. Swan-Ganz catheter is noted. Endotracheal tube is seen at the level of the carina and should be withdrawn 2 cm. Bilateral chest tubes as well as a pericardial and mediastinal drain are seen. Cannula is noted over the right atrium extending inferiorly. No pneumothorax is noted. Central vascular congestion is seen with edema noted bilaterally worse on the right than the left. No pneumothorax is seen. Cannulas are noted overlying the upper left chest with hemostats associated. IMPRESSION: Postsurgical changes as described. Central vascular congestion as well as pulmonary edema worse on the right than the left. No pneumothorax is seen. Endotracheal tube is at the level of the carina and should be withdrawn 2 cm. These results will be called to the ordering clinician or representative by the Radiologist Assistant, and communication documented in the PACS or [employer]. Electronically Signed   By: Alcide Clever M.D.   On: 05/20/2023 23:02   EP STUDY  Result Date: 05/20/2023 See surgical note for result.  VAS [place] LOWER EXTREMITY VENOUS (DVT)  Result Date: 05/19/2023   Lower Venous DVT Study Patient Name:  [name]  Date of Exam:   05/19/2023 Medical Rec #: [id number]      Accession #:    [id number] Date of Birth: 1961/03/16       Patient Gender: M Patient Age:   62 years Exam Location:  [facility] Procedure:      VAS [place] LOWER EXTREMITY VENOUS (DVT) Referring Phys: Jonny Ruiz PAYNE --------------------------------------------------------------------------------  Indications: Swelling.  Comparison Study: Prior negative right LEV done 11/2015 Performing Technologist: Sherren Kerns RVS  Examination Guidelines: A complete evaluation includes B-mode imaging, spectral Doppler, color Doppler, and power Doppler as needed of all accessible portions of each vessel. Bilateral testing is considered an integral part of a complete examination. Limited examinations for reoccurring indications may be performed as noted. The reflux portion of the exam is performed with the patient in reverse Trendelenburg.  +---------+---------------+---------+-----------+----------+--------------+ RIGHT    CompressibilityPhasicitySpontaneityPropertiesThrombus Aging +---------+---------------+---------+-----------+----------+--------------+ CFV      Full           Yes      Yes                                 +---------+---------------+---------+-----------+----------+--------------+ SFJ      Full                                                        +---------+---------------+---------+-----------+----------+--------------+ FV Prox  Full                                                        +---------+---------------+---------+-----------+----------+--------------+ FV Mid   Full                                                        +---------+---------------+---------+-----------+----------+--------------+  PFV      Full                                                        +---------+---------------+---------+-----------+----------+--------------+ POP      Full           Yes      Yes                                 +---------+---------------+---------+-----------+----------+--------------+ PTV      Full                                                         +---------+---------------+---------+-----------+----------+--------------+ PERO     Full                                                        +---------+---------------+---------+-----------+----------+--------------+     Summary: BILATERAL: - No evidence of deep vein thrombosis seen in the lower extremities, bilaterally. -No evidence of popliteal cyst, bilaterally.   *See table(s) above for measurements and observations. Electronically signed by Heath Lark on 05/19/2023 at 10:47:52 PM.    Final    VAS US CAROTID  Result Date: 05/19/2023 Carotid Arterial Duplex Study Patient Name:  [name]  Date of Exam:   05/19/2023 Medical Rec #: [id number]      Accession #:    [id number] Date of Birth: 03-08-1961       Patient Gender: M Patient Age:   64 years Exam Location:  [facility] Procedure:      VAS US CAROTID Referring Phys: Evelene Croon --------------------------------------------------------------------------------  Indications:       Pre operative redo aortic valve. Risk Factors:      Hypertension, hyperlipidemia, Diabetes, coronary artery                    disease, prior CVA. Other Factors:     ESRD, Endocarditis,. Limitations        Today's exam was limited due to the patient's respiratory                    variation. Comparison Study:  Prior carotid duplex done 04/12/18 indicating 1-39% ICA                    stenosis Performing Technologist: Sherren Kerns RVS  Examination Guidelines: A complete evaluation includes B-mode imaging, spectral Doppler, color Doppler, and power Doppler as needed of all accessible portions of each vessel. Bilateral testing is considered an integral part of a complete examination. Limited examinations for reoccurring indications may be performed as noted.  Right Carotid Findings: +----------+--------+--------+--------+------------------+------------------+           PSV cm/sEDV cm/sStenosisPlaque DescriptionComments            +----------+--------+--------+--------+------------------+------------------+ CCA Prox  68      9  PFV      Full                                                        +---------+---------------+---------+-----------+----------+--------------+ POP      Full           Yes      Yes                                 +---------+---------------+---------+-----------+----------+--------------+ PTV      Full                                                         +---------+---------------+---------+-----------+----------+--------------+ PERO     Full                                                        +---------+---------------+---------+-----------+----------+--------------+     Summary: BILATERAL: - No evidence of deep vein thrombosis seen in the lower extremities, bilaterally. -No evidence of popliteal cyst, bilaterally.   *See table(s) above for measurements and observations. Electronically signed by Heath Lark on 05/19/2023 at 10:47:52 PM.    Final    VAS US CAROTID  Result Date: 05/19/2023 Carotid Arterial Duplex Study Patient Name:  [name]  Date of Exam:   05/19/2023 Medical Rec #: [id number]      Accession #:    [id number] Date of Birth: 03-08-1961       Patient Gender: M Patient Age:   64 years Exam Location:  [facility] Procedure:      VAS US CAROTID Referring Phys: Evelene Croon --------------------------------------------------------------------------------  Indications:       Pre operative redo aortic valve. Risk Factors:      Hypertension, hyperlipidemia, Diabetes, coronary artery                    disease, prior CVA. Other Factors:     ESRD, Endocarditis,. Limitations        Today's exam was limited due to the patient's respiratory                    variation. Comparison Study:  Prior carotid duplex done 04/12/18 indicating 1-39% ICA                    stenosis Performing Technologist: Sherren Kerns RVS  Examination Guidelines: A complete evaluation includes B-mode imaging, spectral Doppler, color Doppler, and power Doppler as needed of all accessible portions of each vessel. Bilateral testing is considered an integral part of a complete examination. Limited examinations for reoccurring indications may be performed as noted.  Right Carotid Findings: +----------+--------+--------+--------+------------------+------------------+           PSV cm/sEDV cm/sStenosisPlaque DescriptionComments            +----------+--------+--------+--------+------------------+------------------+ CCA Prox  68      9  Lab 05/21/23 0349 05/21/23 0459 05/21/23 0607 05/21/23 0702 05/21/23 0753  GLUCAP 115* 109* 111* 113* 116*    Iron Studies: No results for input(s): "IRON", "TIBC", "TRANSFERRIN", "FERRITIN" in the last 72 hours. Studies/Results: DG Chest Port 1 View  Result Date: 05/20/2023 CLINICAL DATA:  Status post AVR and tricuspid valve repair EXAM: PORTABLE CHEST 1 VIEW COMPARISON:  05/19/2023 FINDINGS: Postsurgical changes are noted. Cardiac shadow is stable. Swan-Ganz catheter is noted. Endotracheal tube is seen at the level of the carina and should be withdrawn 2 cm. Bilateral chest tubes as well as a pericardial and mediastinal drain are seen. Cannula is noted over the right atrium extending inferiorly. No pneumothorax is noted. Central vascular congestion is seen with edema noted bilaterally worse on the right than the left. No pneumothorax is seen. Cannulas are noted overlying the upper left chest with hemostats associated. IMPRESSION: Postsurgical changes as described. Central vascular congestion as well as pulmonary edema worse on the right than the left. No pneumothorax is seen. Endotracheal tube is at the level of the carina and should be withdrawn 2 cm. These results will be called to the ordering clinician or representative by the Radiologist Assistant, and communication documented in the PACS or [employer]. Electronically Signed   By: Alcide Clever M.D.   On: 05/20/2023 23:02   EP STUDY  Result Date: 05/20/2023 See surgical note for result.  VAS [place] LOWER EXTREMITY VENOUS (DVT)  Result Date: 05/19/2023   Lower Venous DVT Study Patient Name:  [name]  Date of Exam:   05/19/2023 Medical Rec #: [id number]      Accession #:    [id number] Date of Birth: 1961/03/16       Patient Gender: M Patient Age:   62 years Exam Location:  [facility] Procedure:      VAS [place] LOWER EXTREMITY VENOUS (DVT) Referring Phys: Jonny Ruiz PAYNE --------------------------------------------------------------------------------  Indications: Swelling.  Comparison Study: Prior negative right LEV done 11/2015 Performing Technologist: Sherren Kerns RVS  Examination Guidelines: A complete evaluation includes B-mode imaging, spectral Doppler, color Doppler, and power Doppler as needed of all accessible portions of each vessel. Bilateral testing is considered an integral part of a complete examination. Limited examinations for reoccurring indications may be performed as noted. The reflux portion of the exam is performed with the patient in reverse Trendelenburg.  +---------+---------------+---------+-----------+----------+--------------+ RIGHT    CompressibilityPhasicitySpontaneityPropertiesThrombus Aging +---------+---------------+---------+-----------+----------+--------------+ CFV      Full           Yes      Yes                                 +---------+---------------+---------+-----------+----------+--------------+ SFJ      Full                                                        +---------+---------------+---------+-----------+----------+--------------+ FV Prox  Full                                                        +---------+---------------+---------+-----------+----------+--------------+ FV Mid   Full                                                        +---------+---------------+---------+-----------+----------+--------------+  Lab 05/21/23 0349 05/21/23 0459 05/21/23 0607 05/21/23 0702 05/21/23 0753  GLUCAP 115* 109* 111* 113* 116*    Iron Studies: No results for input(s): "IRON", "TIBC", "TRANSFERRIN", "FERRITIN" in the last 72 hours. Studies/Results: DG Chest Port 1 View  Result Date: 05/20/2023 CLINICAL DATA:  Status post AVR and tricuspid valve repair EXAM: PORTABLE CHEST 1 VIEW COMPARISON:  05/19/2023 FINDINGS: Postsurgical changes are noted. Cardiac shadow is stable. Swan-Ganz catheter is noted. Endotracheal tube is seen at the level of the carina and should be withdrawn 2 cm. Bilateral chest tubes as well as a pericardial and mediastinal drain are seen. Cannula is noted over the right atrium extending inferiorly. No pneumothorax is noted. Central vascular congestion is seen with edema noted bilaterally worse on the right than the left. No pneumothorax is seen. Cannulas are noted overlying the upper left chest with hemostats associated. IMPRESSION: Postsurgical changes as described. Central vascular congestion as well as pulmonary edema worse on the right than the left. No pneumothorax is seen. Endotracheal tube is at the level of the carina and should be withdrawn 2 cm. These results will be called to the ordering clinician or representative by the Radiologist Assistant, and communication documented in the PACS or [employer]. Electronically Signed   By: Alcide Clever M.D.   On: 05/20/2023 23:02   EP STUDY  Result Date: 05/20/2023 See surgical note for result.  VAS [place] LOWER EXTREMITY VENOUS (DVT)  Result Date: 05/19/2023   Lower Venous DVT Study Patient Name:  [name]  Date of Exam:   05/19/2023 Medical Rec #: [id number]      Accession #:    [id number] Date of Birth: 1961/03/16       Patient Gender: M Patient Age:   62 years Exam Location:  [facility] Procedure:      VAS [place] LOWER EXTREMITY VENOUS (DVT) Referring Phys: Jonny Ruiz PAYNE --------------------------------------------------------------------------------  Indications: Swelling.  Comparison Study: Prior negative right LEV done 11/2015 Performing Technologist: Sherren Kerns RVS  Examination Guidelines: A complete evaluation includes B-mode imaging, spectral Doppler, color Doppler, and power Doppler as needed of all accessible portions of each vessel. Bilateral testing is considered an integral part of a complete examination. Limited examinations for reoccurring indications may be performed as noted. The reflux portion of the exam is performed with the patient in reverse Trendelenburg.  +---------+---------------+---------+-----------+----------+--------------+ RIGHT    CompressibilityPhasicitySpontaneityPropertiesThrombus Aging +---------+---------------+---------+-----------+----------+--------------+ CFV      Full           Yes      Yes                                 +---------+---------------+---------+-----------+----------+--------------+ SFJ      Full                                                        +---------+---------------+---------+-----------+----------+--------------+ FV Prox  Full                                                        +---------+---------------+---------+-----------+----------+--------------+ FV Mid   Full                                                        +---------+---------------+---------+-----------+----------+--------------+  PFV      Full                                                        +---------+---------------+---------+-----------+----------+--------------+ POP      Full           Yes      Yes                                 +---------+---------------+---------+-----------+----------+--------------+ PTV      Full                                                         +---------+---------------+---------+-----------+----------+--------------+ PERO     Full                                                        +---------+---------------+---------+-----------+----------+--------------+     Summary: BILATERAL: - No evidence of deep vein thrombosis seen in the lower extremities, bilaterally. -No evidence of popliteal cyst, bilaterally.   *See table(s) above for measurements and observations. Electronically signed by Heath Lark on 05/19/2023 at 10:47:52 PM.    Final    VAS US CAROTID  Result Date: 05/19/2023 Carotid Arterial Duplex Study Patient Name:  [name]  Date of Exam:   05/19/2023 Medical Rec #: [id number]      Accession #:    [id number] Date of Birth: 03-08-1961       Patient Gender: M Patient Age:   64 years Exam Location:  [facility] Procedure:      VAS US CAROTID Referring Phys: Evelene Croon --------------------------------------------------------------------------------  Indications:       Pre operative redo aortic valve. Risk Factors:      Hypertension, hyperlipidemia, Diabetes, coronary artery                    disease, prior CVA. Other Factors:     ESRD, Endocarditis,. Limitations        Today's exam was limited due to the patient's respiratory                    variation. Comparison Study:  Prior carotid duplex done 04/12/18 indicating 1-39% ICA                    stenosis Performing Technologist: Sherren Kerns RVS  Examination Guidelines: A complete evaluation includes B-mode imaging, spectral Doppler, color Doppler, and power Doppler as needed of all accessible portions of each vessel. Bilateral testing is considered an integral part of a complete examination. Limited examinations for reoccurring indications may be performed as noted.  Right Carotid Findings: +----------+--------+--------+--------+------------------+------------------+           PSV cm/sEDV cm/sStenosisPlaque DescriptionComments            +----------+--------+--------+--------+------------------+------------------+ CCA Prox  68      9  PFV      Full                                                        +---------+---------------+---------+-----------+----------+--------------+ POP      Full           Yes      Yes                                 +---------+---------------+---------+-----------+----------+--------------+ PTV      Full                                                         +---------+---------------+---------+-----------+----------+--------------+ PERO     Full                                                        +---------+---------------+---------+-----------+----------+--------------+     Summary: BILATERAL: - No evidence of deep vein thrombosis seen in the lower extremities, bilaterally. -No evidence of popliteal cyst, bilaterally.   *See table(s) above for measurements and observations. Electronically signed by Heath Lark on 05/19/2023 at 10:47:52 PM.    Final    VAS US CAROTID  Result Date: 05/19/2023 Carotid Arterial Duplex Study Patient Name:  [name]  Date of Exam:   05/19/2023 Medical Rec #: [id number]      Accession #:    [id number] Date of Birth: 03-08-1961       Patient Gender: M Patient Age:   64 years Exam Location:  [facility] Procedure:      VAS US CAROTID Referring Phys: Evelene Croon --------------------------------------------------------------------------------  Indications:       Pre operative redo aortic valve. Risk Factors:      Hypertension, hyperlipidemia, Diabetes, coronary artery                    disease, prior CVA. Other Factors:     ESRD, Endocarditis,. Limitations        Today's exam was limited due to the patient's respiratory                    variation. Comparison Study:  Prior carotid duplex done 04/12/18 indicating 1-39% ICA                    stenosis Performing Technologist: Sherren Kerns RVS  Examination Guidelines: A complete evaluation includes B-mode imaging, spectral Doppler, color Doppler, and power Doppler as needed of all accessible portions of each vessel. Bilateral testing is considered an integral part of a complete examination. Limited examinations for reoccurring indications may be performed as noted.  Right Carotid Findings: +----------+--------+--------+--------+------------------+------------------+           PSV cm/sEDV cm/sStenosisPlaque DescriptionComments            +----------+--------+--------+--------+------------------+------------------+ CCA Prox  68      9  PFV      Full                                                        +---------+---------------+---------+-----------+----------+--------------+ POP      Full           Yes      Yes                                 +---------+---------------+---------+-----------+----------+--------------+ PTV      Full                                                         +---------+---------------+---------+-----------+----------+--------------+ PERO     Full                                                        +---------+---------------+---------+-----------+----------+--------------+     Summary: BILATERAL: - No evidence of deep vein thrombosis seen in the lower extremities, bilaterally. -No evidence of popliteal cyst, bilaterally.   *See table(s) above for measurements and observations. Electronically signed by Heath Lark on 05/19/2023 at 10:47:52 PM.    Final    VAS US CAROTID  Result Date: 05/19/2023 Carotid Arterial Duplex Study Patient Name:  [name]  Date of Exam:   05/19/2023 Medical Rec #: [id number]      Accession #:    [id number] Date of Birth: 03-08-1961       Patient Gender: M Patient Age:   64 years Exam Location:  [facility] Procedure:      VAS US CAROTID Referring Phys: Evelene Croon --------------------------------------------------------------------------------  Indications:       Pre operative redo aortic valve. Risk Factors:      Hypertension, hyperlipidemia, Diabetes, coronary artery                    disease, prior CVA. Other Factors:     ESRD, Endocarditis,. Limitations        Today's exam was limited due to the patient's respiratory                    variation. Comparison Study:  Prior carotid duplex done 04/12/18 indicating 1-39% ICA                    stenosis Performing Technologist: Sherren Kerns RVS  Examination Guidelines: A complete evaluation includes B-mode imaging, spectral Doppler, color Doppler, and power Doppler as needed of all accessible portions of each vessel. Bilateral testing is considered an integral part of a complete examination. Limited examinations for reoccurring indications may be performed as noted.  Right Carotid Findings: +----------+--------+--------+--------+------------------+------------------+           PSV cm/sEDV cm/sStenosisPlaque DescriptionComments            +----------+--------+--------+--------+------------------+------------------+ CCA Prox  68      9

## 2023-05-21 NOTE — Addendum Note (Signed)
Addendum  created 05/21/23 1208 by Mariann Barter, MD   Clinical Note Signed, Intraprocedure Event deleted, Intraprocedure Event edited

## 2023-05-21 NOTE — Addendum Note (Signed)
Addendum  created 05/21/23 1154 by Mariann Barter, MD   Clinical Note Signed, Intraprocedure Blocks edited

## 2023-05-21 NOTE — Anesthesia Postprocedure Evaluation (Signed)
Anesthesia Post Note  Patient: [name]  Procedure(s) Performed: REDO AORTIC VALVE REPLACEMENT (AVR) USING A 25 MM CRYO AORTIC VALVE CONDUIT (Chest) ASCENDING AORTIC ROOT REPLACEMENT (Chest) TRICUSPID VALVE REPAIR USING A 34 MM MC3 ANNULOPLASTY RING (Chest) TRANSESOPHAGEAL ECHOCARDIOGRAM     Patient location during evaluation: ICU Anesthesia Type: General Level of consciousness: sedated Pain management: pain level controlled Vital Signs Assessment: post-procedure vital signs reviewed and stable Respiratory status: patient remains intubated per anesthesia plan and patient on ventilator - see flowsheet for VS Cardiovascular status: stable Postop Assessment: no apparent nausea or vomiting Anesthetic complications: no   No notable events documented.  Last Vitals:  Vitals:   05/21/23 0645 05/21/23 0700  BP:    Pulse: 80 80  Resp: (!) 0 (!) 0  Temp: (!) 36.1 C (!) 36.1 C  SpO2: 100% 100%    Last Pain:  Vitals:   05/20/23 0634  TempSrc:   PainSc: 10-Worst pain ever                 Mariann Barter

## 2023-05-21 NOTE — Progress Notes (Signed)
1 Day Post-Op Procedure(s) (LRB): REDO AORTIC VALVE REPLACEMENT (AVR) USING A 25 MM CRYO AORTIC VALVE CONDUIT (N/A) ASCENDING AORTIC ROOT REPLACEMENT (N/A) TRICUSPID VALVE REPAIR USING A 34 MM MC3 ANNULOPLASTY RING (N/A) TRANSESOPHAGEAL ECHOCARDIOGRAM (N/A) Subjective:  Hemodynamics stable overnight but had to give volume to maintain flows with goal of 4-5 L/min.  Epi 5, vaso 0.02.  Co-ox 100 with PO2 of 514 CVP 7 Sedated on vent   Objective: Vital signs in last 24 hours: Temp:  [96.8 F (36 C)-97.5 F (36.4 C)] 97 F (36.1 C) (09/14 0700) Pulse Rate:  [79-173] 80 (09/14 0700) Cardiac Rhythm: A-V Sequential paced (09/13 2200) Resp:  [0-22] 0 (09/14 0700) SpO2:  [100 %] 100 % (09/14 0700) Arterial Line BP: (67-105)/(63-98) 105/98 (09/14 0700) FiO2 (%):  [50 %] 50 % (09/14 0343) Weight:  [83.1 kg] 83.1 kg (09/14 0615)  Hemodynamic parameters for last 24 hours: PAP: (-1-22)/(-5-19) 0/-3 CVP:  [6 mmHg-15 mmHg] 10 mmHg  Intake/Output from previous day: 09/13 0701 - 09/14 0700 In: 7975.3 [I.V.:3156.3; RUEAV:4098; IV Piggyback:1160] Out: 1227 [Urine:75; Chest Tube:655] Intake/Output this shift: No intake/output data recorded.  General appearance: sedated on vent Neurologic: intact Heart: regular rate and rhythm, S1, S2 normal, no murmur Lungs: coarse bilaterally Abdomen: soft, no BS. Extremities: anasarca, feet and hands warm Wound: chest dressing intact. Does not appear to be excessive hematoma under dressing. Some drainage around the chest tubes.  Lab Results: Recent Labs    05/20/23 2118 05/21/23 0014 05/21/23 0340 05/21/23 0613 05/21/23 0719  WBC 25.0*  --  18.9*  --   --   HGB 7.9*   < > 8.6* 7.1* 7.8*  HCT 23.2*   < > 25.8* 21.0* 23.0*  PLT 117*  --  81*  --   --    < > = values in this interval not displayed.   BMET:  Recent Labs    05/20/23 2118 05/21/23 0014 05/21/23 0340 05/21/23 0613 05/21/23 0719  NA 144   < > 140  140 140 140  K 5.5*   < >  5.3*  5.3* 5.7* 5.7*  CL 110  --  106  107  --   --   CO2 21*  --  22  21*  --   --   GLUCOSE 112*  --  124*  123*  --   --   BUN 32*  --  29*  30*  --   --   CREATININE 1.99*  --  1.91*  1.89*  --   --   CALCIUM 8.1*  --  7.8*  7.8*  --   --    < > = values in this interval not displayed.    PT/INR:  Recent Labs    05/21/23 0340  LABPROT 16.8*  INR 1.3*   ABG    Component Value Date/Time   PHART 7.389 05/21/2023 0719   HCO3 25.4 05/21/2023 0719   TCO2 27 05/21/2023 0719   ACIDBASEDEF 1.0 05/21/2023 0014   O2SAT 100 05/21/2023 0719   CBG (last 3)  Recent Labs    05/21/23 0459 05/21/23 0607 05/21/23 0702  GLUCAP 109* 111* 113*   CXR: bilateral pulmonary edema  Assessment/Plan: S/P Procedure(s) (LRB): REDO AORTIC VALVE REPLACEMENT (AVR) USING A 25 MM CRYO AORTIC VALVE CONDUIT (N/A) ASCENDING AORTIC ROOT REPLACEMENT (N/A) TRICUSPID VALVE REPAIR USING A 34 MM MC3 ANNULOPLASTY RING (N/A) TRANSESOPHAGEAL ECHOCARDIOGRAM (N/A)  Hemodynamics stable on central VA ECMO with low dose vasopressor requirements.  Continue CRRT to remove volume as tolerated.   Thrombocytopenia: will transfuse 1 unit plts this am since CT's still draining some bloody fluid. INR 1.3, fib 209.  Postop blood loss anemia: transfuse 1 unit this am.  Hold off on anticoagulation today and hopefully start tomorrow.  Continue vanc and ceftriaxone per pharmacy. Preop BC negative so far. OR valve and annular tissue GS negative for WBC and organisms. Culture pending.   LOS: 3 days    Alleen Borne 05/21/2023

## 2023-05-22 ENCOUNTER — Inpatient Hospital Stay (HOSPITAL_COMMUNITY): Payer: PPO

## 2023-05-22 ENCOUNTER — Encounter (HOSPITAL_COMMUNITY): Payer: Self-pay | Admitting: Surgery

## 2023-05-22 DIAGNOSIS — R570 Cardiogenic shock: Secondary | ICD-10-CM | POA: Diagnosis not present

## 2023-05-22 DIAGNOSIS — Z515 Encounter for palliative care: Secondary | ICD-10-CM | POA: Diagnosis not present

## 2023-05-22 DIAGNOSIS — Z7189 Other specified counseling: Secondary | ICD-10-CM

## 2023-05-22 DIAGNOSIS — D649 Anemia, unspecified: Secondary | ICD-10-CM | POA: Diagnosis not present

## 2023-05-22 LAB — BPAM RBC
Blood Product Expiration Date: 202409232359
Blood Product Expiration Date: 202409232359
Blood Product Expiration Date: 202410022359
Blood Product Expiration Date: 202410022359
Blood Product Expiration Date: 202410032359
Blood Product Expiration Date: 202410032359
Blood Product Expiration Date: 202410032359
Blood Product Expiration Date: 202410032359
Blood Product Expiration Date: 202410042359
Blood Product Expiration Date: 202410042359
Blood Product Expiration Date: 202410042359
Blood Product Expiration Date: 202410062359
ISSUE DATE / TIME: 202409130732
ISSUE DATE / TIME: 202409130732
ISSUE DATE / TIME: 202409130732
ISSUE DATE / TIME: 202409130732
ISSUE DATE / TIME: 202409130943
ISSUE DATE / TIME: 202409130943
ISSUE DATE / TIME: 202409130943
ISSUE DATE / TIME: 202409130943
ISSUE DATE / TIME: 202409140834
Unit Type and Rh: 7300
Unit Type and Rh: 7300
Unit Type and Rh: 7300
Unit Type and Rh: 7300
Unit Type and Rh: 7300
Unit Type and Rh: 7300
Unit Type and Rh: 7300
Unit Type and Rh: 7300
Unit Type and Rh: 7300
Unit Type and Rh: 7300
Unit Type and Rh: 7300
Unit Type and Rh: 7300

## 2023-05-22 LAB — POCT I-STAT 7, (LYTES, BLD GAS, ICA,H+H)
Acid-Base Excess: 0 mmol/L (ref 0.0–2.0)
Acid-Base Excess: 0 mmol/L (ref 0.0–2.0)
Acid-Base Excess: 0 mmol/L (ref 0.0–2.0)
Acid-Base Excess: 1 mmol/L (ref 0.0–2.0)
Acid-base deficit: 1 mmol/L (ref 0.0–2.0)
Bicarbonate: 24.2 mmol/L (ref 20.0–28.0)
Bicarbonate: 24.2 mmol/L (ref 20.0–28.0)
Bicarbonate: 24.3 mmol/L (ref 20.0–28.0)
Bicarbonate: 24.6 mmol/L (ref 20.0–28.0)
Bicarbonate: 25 mmol/L (ref 20.0–28.0)
Calcium, Ion: 1.1 mmol/L — ABNORMAL LOW (ref 1.15–1.40)
Calcium, Ion: 1.11 mmol/L — ABNORMAL LOW (ref 1.15–1.40)
Calcium, Ion: 1.11 mmol/L — ABNORMAL LOW (ref 1.15–1.40)
Calcium, Ion: 1.12 mmol/L — ABNORMAL LOW (ref 1.15–1.40)
Calcium, Ion: 1.1 mmol/L — ABNORMAL LOW (ref 1.15–1.40)
HCT: 22 % — ABNORMAL LOW (ref 39.0–52.0)
HCT: 24 % — ABNORMAL LOW (ref 39.0–52.0)
HCT: 24 % — ABNORMAL LOW (ref 39.0–52.0)
HCT: 24 % — ABNORMAL LOW (ref 39.0–52.0)
HCT: 22 % — ABNORMAL LOW (ref 39.0–52.0)
Hemoglobin: 7.5 g/dL — ABNORMAL LOW (ref 13.0–17.0)
Hemoglobin: 8.2 g/dL — ABNORMAL LOW (ref 13.0–17.0)
Hemoglobin: 8.2 g/dL — ABNORMAL LOW (ref 13.0–17.0)
Hemoglobin: 8.2 g/dL — ABNORMAL LOW (ref 13.0–17.0)
Hemoglobin: 7.5 g/dL — ABNORMAL LOW (ref 13.0–17.0)
O2 Saturation: 100 %
O2 Saturation: 100 %
O2 Saturation: 100 %
O2 Saturation: 100 %
O2 Saturation: 100 %
Patient temperature: 36.7
Patient temperature: 36.7
Patient temperature: 36.8
Patient temperature: 36.9
Patient temperature: 36.9
Potassium: 4.1 mmol/L (ref 3.5–5.1)
Potassium: 4 mmol/L (ref 3.5–5.1)
Potassium: 3.9 mmol/L (ref 3.5–5.1)
Potassium: 3.8 mmol/L (ref 3.5–5.1)
Potassium: 3.5 mmol/L (ref 3.5–5.1)
Sodium: 138 mmol/L (ref 135–145)
Sodium: 138 mmol/L (ref 135–145)
Sodium: 137 mmol/L (ref 135–145)
Sodium: 137 mmol/L (ref 135–145)
Sodium: 136 mmol/L (ref 135–145)
TCO2: 25 mmol/L (ref 22–32)
TCO2: 25 mmol/L (ref 22–32)
TCO2: 25 mmol/L (ref 22–32)
TCO2: 26 mmol/L (ref 22–32)
TCO2: 26 mmol/L (ref 22–32)
pCO2 arterial: 39.2 mmHg (ref 32–48)
pCO2 arterial: 38.3 mmHg (ref 32–48)
pCO2 arterial: 37.7 mmHg (ref 32–48)
pCO2 arterial: 37.5 mmHg (ref 32–48)
pCO2 arterial: 37.6 mmHg (ref 32–48)
pH, Arterial: 7.398 (ref 7.35–7.45)
pH, Arterial: 7.408 (ref 7.35–7.45)
pH, Arterial: 7.417 (ref 7.35–7.45)
pH, Arterial: 7.424 (ref 7.35–7.45)
pH, Arterial: 7.43 (ref 7.35–7.45)
pO2, Arterial: 239 mmHg — ABNORMAL HIGH (ref 83–108)
pO2, Arterial: 202 mmHg — ABNORMAL HIGH (ref 83–108)
pO2, Arterial: 184 mmHg — ABNORMAL HIGH (ref 83–108)
pO2, Arterial: 173 mmHg — ABNORMAL HIGH (ref 83–108)
pO2, Arterial: 182 mmHg — ABNORMAL HIGH (ref 83–108)

## 2023-05-22 LAB — TYPE AND SCREEN
ABO/RH(D): B POS
Antibody Screen: NEGATIVE
Unit division: 0
Unit division: 0
Unit division: 0
Unit division: 0
Unit division: 0
Unit division: 0
Unit division: 0
Unit division: 0
Unit division: 0
Unit division: 0
Unit division: 0
Unit division: 0

## 2023-05-22 LAB — BPAM PLATELET PHERESIS
Blood Product Expiration Date: 202409152359
Blood Product Expiration Date: 202409152359
Blood Product Expiration Date: 202409162359
Blood Product Expiration Date: 202409162359
Blood Product Expiration Date: 202409162359
Blood Product Expiration Date: 202409172359
ISSUE DATE / TIME: 202409131258
ISSUE DATE / TIME: 202409131258
ISSUE DATE / TIME: 202409131507
ISSUE DATE / TIME: 202409131507
ISSUE DATE / TIME: 202409131547
ISSUE DATE / TIME: 202409140848
Unit Type and Rh: 6200
Unit Type and Rh: 6200
Unit Type and Rh: 7300
Unit Type and Rh: 8400
Unit Type and Rh: 6200
Unit Type and Rh: 5100

## 2023-05-22 LAB — RENAL FUNCTION PANEL
Albumin: 2.9 g/dL — ABNORMAL LOW (ref 3.5–5.0)
Albumin: 2.7 g/dL — ABNORMAL LOW (ref 3.5–5.0)
Anion gap: 11 (ref 5–15)
Anion gap: 10 (ref 5–15)
BUN: 18 mg/dL (ref 8–23)
BUN: 15 mg/dL (ref 8–23)
CO2: 23 mmol/L (ref 22–32)
CO2: 23 mmol/L (ref 22–32)
Calcium: 7.8 mg/dL — ABNORMAL LOW (ref 8.9–10.3)
Calcium: 7.9 mg/dL — ABNORMAL LOW (ref 8.9–10.3)
Chloride: 103 mmol/L (ref 98–111)
Chloride: 102 mmol/L (ref 98–111)
Creatinine, Ser: 1.43 mg/dL — ABNORMAL HIGH (ref 0.61–1.24)
Creatinine, Ser: 1.31 mg/dL — ABNORMAL HIGH (ref 0.61–1.24)
GFR, Estimated: 55 mL/min — ABNORMAL LOW (ref >60)
GFR, Estimated: >60 mL/min (ref >60)
Glucose, Bld: 135 mg/dL — ABNORMAL HIGH (ref 70–99)
Glucose, Bld: 126 mg/dL — ABNORMAL HIGH (ref 70–99)
Phosphorus: 3.9 mg/dL (ref 2.5–4.6)
Phosphorus: 2.8 mg/dL (ref 2.5–4.6)
Potassium: 3.9 mmol/L (ref 3.5–5.1)
Potassium: 3.6 mmol/L (ref 3.5–5.1)
Sodium: 137 mmol/L (ref 135–145)
Sodium: 135 mmol/L (ref 135–145)

## 2023-05-22 LAB — GLUCOSE, CAPILLARY
Glucose-Capillary: 122 mg/dL — ABNORMAL HIGH (ref 70–99)
Glucose-Capillary: 135 mg/dL — ABNORMAL HIGH (ref 70–99)
Glucose-Capillary: 132 mg/dL — ABNORMAL HIGH (ref 70–99)
Glucose-Capillary: 122 mg/dL — ABNORMAL HIGH (ref 70–99)
Glucose-Capillary: 133 mg/dL — ABNORMAL HIGH (ref 70–99)

## 2023-05-22 LAB — CBC
HCT: 20.9 % — ABNORMAL LOW (ref 39.0–52.0)
HCT: 22.8 % — ABNORMAL LOW (ref 39.0–52.0)
Hemoglobin: 7 g/dL — ABNORMAL LOW (ref 13.0–17.0)
Hemoglobin: 7.8 g/dL — ABNORMAL LOW (ref 13.0–17.0)
MCH: 28.9 pg (ref 26.0–34.0)
MCH: 29.5 pg (ref 26.0–34.0)
MCHC: 33.5 g/dL (ref 30.0–36.0)
MCHC: 34.2 g/dL (ref 30.0–36.0)
MCV: 86.4 fL (ref 80.0–100.0)
MCV: 86.4 fL (ref 80.0–100.0)
Platelets: 58 10*3/uL — ABNORMAL LOW (ref 150–400)
Platelets: 49 10*3/uL — ABNORMAL LOW (ref 150–400)
RBC: 2.42 MIL/uL — ABNORMAL LOW (ref 4.22–5.81)
RBC: 2.64 MIL/uL — ABNORMAL LOW (ref 4.22–5.81)
RDW: 17.8 % — ABNORMAL HIGH (ref 11.5–15.5)
RDW: 17.6 % — ABNORMAL HIGH (ref 11.5–15.5)
WBC: 35.8 10*3/uL — ABNORMAL HIGH (ref 4.0–10.5)
WBC: 50.7 10*3/uL (ref 4.0–10.5)
nRBC: 1.4 % — ABNORMAL HIGH (ref 0.0–0.2)
nRBC: 1.4 % — ABNORMAL HIGH (ref 0.0–0.2)

## 2023-05-22 LAB — PREPARE PLATELET PHERESIS
Unit division: 0
Unit division: 0
Unit division: 0
Unit division: 0
Unit division: 0
Unit division: 0

## 2023-05-22 LAB — PREPARE FRESH FROZEN PLASMA
Unit division: 0
Unit division: 0

## 2023-05-22 LAB — BPAM FFP
Blood Product Expiration Date: 202409142359
Blood Product Expiration Date: 202409142359
Blood Product Expiration Date: 202409182359
Blood Product Expiration Date: 202409182359
Blood Product Expiration Date: 202409182359
Blood Product Expiration Date: 202409182359
ISSUE DATE / TIME: 202409131407
ISSUE DATE / TIME: 202409131407
ISSUE DATE / TIME: 202409131442
ISSUE DATE / TIME: 202409131442
ISSUE DATE / TIME: 202409132005
ISSUE DATE / TIME: 202409132005
Unit Type and Rh: 7300
Unit Type and Rh: 7300
Unit Type and Rh: 8400
Unit Type and Rh: 8400
Unit Type and Rh: 7300
Unit Type and Rh: 7300

## 2023-05-22 LAB — HEPATIC FUNCTION PANEL
ALT: 12 U/L (ref 0–44)
AST: 83 U/L — ABNORMAL HIGH (ref 15–41)
Albumin: 2.9 g/dL — ABNORMAL LOW (ref 3.5–5.0)
Alkaline Phosphatase: 39 U/L (ref 38–126)
Bilirubin, Direct: 0.4 mg/dL — ABNORMAL HIGH (ref 0.0–0.2)
Indirect Bilirubin: 0.9 mg/dL (ref 0.3–0.9)
Total Bilirubin: 1.3 mg/dL — ABNORMAL HIGH (ref 0.3–1.2)
Total Protein: 5.3 g/dL — ABNORMAL LOW (ref 6.5–8.1)

## 2023-05-22 LAB — BPAM CRYOPRECIPITATE
Blood Product Expiration Date: 202409172359
Blood Product Expiration Date: 202409182359
Blood Product Expiration Date: 202409182359
Blood Product Expiration Date: 202409182359
ISSUE DATE / TIME: 202409131407
ISSUE DATE / TIME: 202409131442
ISSUE DATE / TIME: 202409132331
ISSUE DATE / TIME: 202409132331
Unit Type and Rh: 6200
Unit Type and Rh: 7300
Unit Type and Rh: 6200
Unit Type and Rh: 6200

## 2023-05-22 LAB — PREPARE CRYOPRECIPITATE
Unit division: 0
Unit division: 0
Unit division: 0
Unit division: 0

## 2023-05-22 LAB — LACTIC ACID, PLASMA
Lactic Acid, Venous: 1.7 mmol/L (ref 0.5–1.9)
Lactic Acid, Venous: 1.7 mmol/L (ref 0.5–1.9)

## 2023-05-22 LAB — PROTIME-INR
INR: 1.5 — ABNORMAL HIGH (ref 0.8–1.2)
Prothrombin Time: 18.4 seconds — ABNORMAL HIGH (ref 11.4–15.2)

## 2023-05-22 LAB — APTT
aPTT: 38 s — ABNORMAL HIGH (ref 24–36)
aPTT: 53 seconds — ABNORMAL HIGH (ref 24–36)
aPTT: 62 seconds — ABNORMAL HIGH (ref 24–36)

## 2023-05-22 LAB — CG4 I-STAT (LACTIC ACID): Lactic Acid, Venous: 1.5 mmol/L (ref 0.5–1.9)

## 2023-05-22 LAB — VANCOMYCIN, TROUGH: Vancomycin Tr: 17 ug/mL (ref 15–20)

## 2023-05-22 LAB — FIBRINOGEN: Fibrinogen: 411 mg/dL (ref 210–475)

## 2023-05-22 LAB — PREPARE RBC (CROSSMATCH)

## 2023-05-22 LAB — LACTATE DEHYDROGENASE: LDH: 1020 U/L — ABNORMAL HIGH (ref 98–192)

## 2023-05-22 MED ORDER — SODIUM CHLORIDE 0.9 % IV SOLN
1.0000 g | Freq: Three times a day (TID) | INTRAVENOUS | Status: DC
  Administered 2023-05-22 – 2023-06-04 (×37): 1 g via INTRAVENOUS
  Filled 2023-05-22 (×37): qty 20

## 2023-05-22 MED ORDER — ORAL CARE MOUTH RINSE
15.0000 mL | OROMUCOSAL | Status: DC
  Administered 2023-05-22 – 2023-06-11 (×246): 15 mL via OROMUCOSAL

## 2023-05-22 MED ORDER — SODIUM CHLORIDE 0.9% IV SOLUTION: Freq: Once | INTRAVENOUS | Status: AC

## 2023-05-22 MED ORDER — ORAL CARE MOUTH RINSE: 15.0000 mL | OROMUCOSAL | Status: DC | PRN

## 2023-05-22 MED ORDER — SODIUM CHLORIDE 0.9 % IV SOLN
0.0150 mg/kg/h | INTRAVENOUS | Status: DC
  Administered 2023-05-22: 0.02 mg/kg/h via INTRAVENOUS
  Filled 2023-05-22 (×2): qty 250

## 2023-05-22 NOTE — Progress Notes (Signed)
Pharmacy Antibiotic Note  [name] is a 62 y.o. male admitted on 05/17/2023 with bioprosthetic aortic valve dehiscence > s/p aortic root / valve repair possible endocarditis - s/p MSSA TV endocarditis 01/2023. Afebrile, wbc 12 esrd >CRRT / ECMO post op. Initial antibiotics: vancomycin + ceftriaxone.  WBC jump up to 50 tonight, will broaden antibiotic coverage and add meropenem.   Plan: Vancomycin 1gm IV q24h Meropenem 1gm IV q8h  Height: 5\' 6"  (167.6 cm) Weight: 87.6 kg (193 lb 2 oz) IBW/kg (Calculated) : 63.8  Temp (24hrs), Avg:97.8 F (36.6 C), Min:97.3 F (36.3 C), Max:98.1 F (36.7 C)  Recent Labs  Lab 05/20/23 2118 05/21/23 0340 05/21/23 0351 05/21/23 0609 05/21/23 1440 05/21/23 1707 05/21/23 1720 05/22/23 0337 05/22/23 0351 05/22/23 1634  WBC 25.0* 18.9*  --   --  24.1*  --   --  35.8*  --  50.7*  CREATININE 1.99* 1.91*  1.89*  --   --   --  1.56*  --  1.43*  --  1.31*  LATICACIDVEN  --   --  2.1* 1.3  --   --  1.2  --  1.5 1.7    Estimated Creatinine Clearance: 59.8 mL/min (A) (by C-G formula based on SCr of 1.31 mg/dL (H)).    Allergies  Allergen Reactions   Other Anaphylaxis    Mushrooms  Not listed on MAR    Plavix [Clopidogrel Bisulfate] Other (See Comments)    TTP Not listed on the [facility]   Fleet Enema [Enema] Other (See Comments)    Unknown reaction   Lovenox [Enoxaparin] Other (See Comments)    Unknown reaction   Morphine Other (See Comments)    Unknown reaction   Nsaids Other (See Comments)    Unknown reaction   Hydrocodone-Acetaminophen Itching    Antimicrobials this admission:   Dose adjustments this admission:   Microbiology results: 9/11 BCx: ngtd 9/11 MRSA pcr neg    Leota Sauers Pharm.D. CPP, BCPS Clinical Pharmacist [phone] 05/22/2023 6:02 PM

## 2023-05-22 NOTE — Progress Notes (Signed)
NAME:  Riyad Brinser, MRN:  [id number], DOB:  20-Mar-1961, LOS: 4 ADMISSION DATE:  05/24/2023, CONSULTATION DATE:  9/11 REFERRING MD:  Dr. Izora Ribas, CHIEF COMPLAINT:  aortic valve dehiscence   History of Present Illness:  Patient is [name] w/ pertinent PMH CAD s/p CABG 2017 w/ AVR (magna ease for bicuspid valve disease), HLD, HTN, prior CVA, T2DM, CKD4 was previously on HD presents to [facility] on 9/10 chest pain.  Patient recently admitted to [facility] on 5/25 w/ AKI and AMS. While in ED patient had PEA arrest w/ ROSC in about 8 minutes. Patient required dialysis on this admission. Also w/ MSSA bacteremia associated w/ tricuspid valve endocarditis. Patient transferred to [facility] on 5/25. Treated w/ 6 week course oxacillin and rifampin. This admission also suffered a R cerebellar CVA w/ MRI likely septic emboli and had cholangitis/pancreatitis requiring ERCP.  On 9/10 patient admitted to [facility] w/ chest pain, dyspnea, and BLE edema. BNP 2,097. CXR w/ pulm edema. Patient started on IV lasix infusion. Cards consulted. On 9/11 patient transferred to [facility]. Patient's echo showing possible aortic valve dehiscence. Cultures repeated and started on rocephin. Patient transferred to ICU for TEE and general anesthesia and to start CRRT. PCCM consulted.  Pertinent  Medical History   Past Medical History:  Diagnosis Date   Allergy    Anemia    Anxiety    Baker's cyst of knee    Blood transfusion without reported diagnosis    as baby    Coronary artery disease    quadruple bypass - March 2016   GERD (gastroesophageal reflux disease)    Gouty arthritis    "real bad" (01/17/2013)   Heart murmur    Hypercholesteremia    Hypertension    MSSA bacteremia 01/29/2023   Myocardial infarction (HCC) 2017   PEA (Pulseless electrical activity) (HCC) 01/29/2023   Stroke (HCC)    Type II diabetes mellitus (HCC)      Significant Hospital Events: Including procedures, antibiotic start and stop dates  in addition to other pertinent events   9/10 admitted 9/11 echo showing aortic valve dehiscence, confirmed by TEE.  9/11 started (back) on CRRT for volume overload and known CKD 4 9/12 breathing better after CRRT 9/13 to OR for Redo of aortic valve, tricuspid valve repair and ascending aortic root replacement w/ re-implantation of SVG to RCA. Post op TEE EF 55% RV mid-mod HK, AVR/TV repair stable. Could not come off CPB. Worse pulmonary edema. High dose pressors. Cannulated and started on VA ECMO w/ central cannulation   Interim History / Subjective:  Still having trouble getting negative. Remains sedated with open chest.  Objective   Blood pressure 101/60, pulse (!) 52, temperature 97.7 F (36.5 C), resp. rate 15, height 5\' 6"  (1.676 m), weight 87.6 kg, SpO2 100%. PAP: (8-29)/(4-10) 29/7 CVP:  [9 mmHg-14 mmHg] 9 mmHg  Vent Mode: PRVC FiO2 (%):  [50 %] 50 % Set Rate:  [15 bmp-16 bmp] 15 bmp Vt Set:  [400 mL-510 mL] 400 mL PEEP:  [5 cmH20] 5 cmH20 Pressure Support:  [10 cmH20] 10 cmH20 Plateau Pressure:  [20 cmH20-28 cmH20] 28 cmH20   Intake/Output Summary (Last 24 hours) at 05/22/2023 0815 Last data filed at 05/22/2023 0700 Gross per 24 hour  Intake 4499.77 ml  Output 2653.2 ml  Net 1846.57 ml   Filed Weights   05/23/2023 0545 05/21/23 0615 05/22/23 0615  Weight: 84.8 kg 83.1 kg 87.6 kg    Examination: Heavily sedated  Open chest ECMO circuit looks okay: some fibrin on post-oxygenator although I can't see it Paced with underlying questionable sinus rhythm; adjusted at bedside to backup mode by Dr.Bartle as MAPs increased with intrinsic rhythm Harsh rhonci bilaterally, DP up slightly to 18 cmH2O Abd soft hypoactive BS anasarca RASS -5  ABG improved hyperoxia BMP stable on CRRT LDH creeping upward off bival H/H lower, getting another unit Fibrinogen 209>>411 CXR marked edema vs. Airspace disease unchanged  Assessment & Plan:   Post cardiotomy vasoplegia/cardioplegia w/  failure to wean from CPB Acute on chronic combined HF  Hx MSSA endocarditis S/p AVR/Bentall and reimplantation of Coronaries and TV repair  Acute respiratory failure w/ hypoxia due to volume overload, with untreated OSA CKD 4. Had recently been on HD Circuit related hemolysis L>R BLE swelling ->LE [place] neg for DVT CAD s/p CABG in 2017 w/ AVR HTN HLD Hx of CVA DMT2  - Blender in ECMO circuit targeting paO2 <300 - Pressors for mAP 65 - VA flows per TCTS/AHF, lowering today to facilitate fluid removal - Abx as ordered; prolonged course expected - Rest settings on vent, target DP 15, compliance mostly driven by pulmonary edema; already on 6 cc/kg; could consider 4 cc/kg if continued rise in DP - CRRT targeting event to neg as able - Transfusion thresholds per TCTS - SSI  Best Practice (right click and "Reselect all SmartList Selections" daily)   Diet/type: Regular consistency (see orders) DVT prophylaxis: bival to start today GI prophylaxis: PPI Lines: Central line and Dialysis Catheter Foley: yes Code Status:  full code Last date of multidisciplinary goals of care discussion [pending]  CRITICAL CARE 32 min  Performed by: Lorin Glass

## 2023-05-22 NOTE — Progress Notes (Signed)
Overnight ECMO updates:  Around 1920 pt experienced chugging associated with drop in ECMO flows from 3.7 down to 2.5 LPM. SBP dropped to the 50's. Pressors titrated up and Albumin boluses given. Communicated to bedside RN to stop fluid removal on CRRT for time being.  After initial flow/chugging issues patient tolerated ECMO flows of 3.7 LPM overnight with no issues. Bedside RN was able to slowly pull fluid with CRRT.  0430: AM Hemoglobin of 7.0 and Plts of 58 called into Dr. Shirlee Latch. Verbal order for 1 unit of PRBC.

## 2023-05-22 NOTE — Progress Notes (Signed)
2 Days Post-Op Procedure(s) (LRB): REDO AORTIC VALVE REPLACEMENT (AVR) USING A 25 MM CRYO AORTIC VALVE CONDUIT (N/A) ASCENDING AORTIC ROOT REPLACEMENT (N/A) TRICUSPID VALVE REPAIR USING A 34 MM MC3 ANNULOPLASTY RING (N/A) TRANSESOPHAGEAL ECHOCARDIOGRAM (N/A) Subjective:  Hemodynamics relatively stable overnight but had some chugging and low flows responding to more volume. Not able to remove anything. +2L for 24 hrs.  Appears to be in sinus rhythm 70's. Pulsatile arterial waveform.   Objective: Vital signs in last 24 hours: Temp:  [97.3 F (36.3 C)-98.8 F (37.1 C)] 97.5 F (36.4 C) (09/15 0700) Pulse Rate:  [73-130] 76 (09/15 0700) Cardiac Rhythm: A-V Sequential paced (09/14 2000) Resp:  [0-18] 0 (09/15 0700) SpO2:  [98 %-100 %] 99 % (09/15 0700) Arterial Line BP: (77-98)/(67-85) 86/72 (09/15 0700) FiO2 (%):  [50 %] 50 % (09/15 0600) Weight:  [87.6 kg] 87.6 kg (09/15 0615)  Hemodynamic parameters for last 24 hours: PAP: (8-20)/(4-10) 14/10 CVP:  [9 mmHg-14 mmHg] 11 mmHg  Intake/Output from previous day: 09/14 0701 - 09/15 0700 In: 4849.6 [I.V.:2963.6; Blood:920; NG/GT:330; IV Piggyback:636.1] Out: 2753.2 [Urine:30; Emesis/NG output:300; Chest Tube:290] Intake/Output this shift: No intake/output data recorded.  General appearance: intubated and sedated Neurologic: unable to assess Heart: regular rate and rhythm, S1, S2 normal, no murmur Lungs: coarse bilaterally Abdomen: soft, no bowel sounds  Extremities: anasarca Wound: chest dressing intact. Does not appear to have any significant hematoma under Esmark. Chest tube output low.  Lab Results: Recent Labs    05/21/23 1440 05/21/23 1715 05/22/23 0337 05/22/23 0345  WBC 24.1*  --  35.8*  --   HGB 8.3*   < > 7.0* 7.5*  HCT 24.3*   < > 20.9* 22.0*  PLT 93*  --  58*  --    < > = values in this interval not displayed.   BMET:  Recent Labs    05/21/23 1707 05/21/23 1715 05/22/23 0337 05/22/23 0345  NA 139   <  > 137 138  K 4.7   < > 3.9 4.1  CL 105  --  103  --   CO2 24  --  23  --   GLUCOSE 147*  --  135*  --   BUN 22  --  18  --   CREATININE 1.56*  --  1.43*  --   CALCIUM 7.8*  --  7.8*  --    < > = values in this interval not displayed.    PT/INR:  Recent Labs    05/22/23 0337  LABPROT 18.4*  INR 1.5*   ABG    Component Value Date/Time   PHART 7.398 05/22/2023 0345   HCO3 24.2 05/22/2023 0345   TCO2 25 05/22/2023 0345   ACIDBASEDEF 1.0 05/22/2023 0345   O2SAT 100 05/22/2023 0345   CBG (last 3)  Recent Labs    05/21/23 2007 05/21/23 2351 05/22/23 0343  GLUCAP 129* 136* 122*   CXR: diffuse bilateral airspace disease consistent with pulmonary edema. Assessment/Plan: S/P Procedure(s) (LRB): REDO AORTIC VALVE REPLACEMENT (AVR) USING A 25 MM CRYO AORTIC VALVE CONDUIT (N/A) ASCENDING AORTIC ROOT REPLACEMENT (N/A) TRICUSPID VALVE REPAIR USING A 34 MM MC3 ANNULOPLASTY RING (N/A) TRANSESOPHAGEAL ECHOCARDIOGRAM (N/A)  Hemodynamics fairly stable on epi 5, NE 1, vaso 0.03. ECMO flows turned down since cardiac function was good and very pulsatile arterial waveform. This should avoid chugging and low flow alerts and hopefully allow volume removal. Sinus rhythm and seem to like his own rhythm so will put pacer to  VVI 50.  Some fibrin seen in membrane and chest tube output minimal so will start bival per pharmacy today.  Thrombocytopenia: likely related to surgery, heparin, ECMO. I doubt HIT given time course but we are starting bival anyway.  Leukocytosis to 36K, likely inflammatory. He remain on vanc and ceftriaxone. OR cultures negative so far.  Continue CRRT to try to remove 50/hr today.   LOS: 4 days    Alleen Borne 05/22/2023

## 2023-05-22 NOTE — Progress Notes (Signed)
PT Cancellation Note  Patient Details Name: Maclovio Exline MRN: [id number] DOB: Oct 19, 1960   Cancelled Treatment:    Reason Eval/Treat Not Completed: Patient not medically ready; remains on ECMO and vented.  Will continue attempts.    Elray Mcgregor 05/22/2023, 8:56 AM Sheran Lawless, PT Acute Rehabilitation Services Office:[phone] 05/22/2023

## 2023-05-22 NOTE — Consult Note (Addendum)
Palliative Care Consult Note                                  Date: 05/22/2023   Patient Name: [name]  DOB: May 22, 1961  MRN: [id number]  Age / Sex: 62 y.o., male  PCP: Marcine Matar, MD Referring Physician: Alleen Borne, MD  Reason for Consultation: Patient is on ECMO  HPI/Patient Profile: 62 y.o. male  with past medical history of CAD s/p CABG 2017 w/ AVR (magna ease for bicuspid valve disease), HLD, HTN, prior CVA, T2DM, CKD4 was previously on HD admitted to [facility] on 2023-05-27 for evaluation of chest pain.   He was found to have an aortic valve dehiscence confirmed by TEE on 05/18/23. Started CRRT for volume overload that day. 06/03/2023 he went to OR for redo of aortic valve, tricuspid valve repair and ascending aortic root replacement with re-implantation of SVG to RCA; could not come off CPB, cannulated and started on VA ECMO with central cannulation  Recent admission on 5/25 with AKI and AMS, had PEA arrest with ROSC in 8 minutes, required dialysis, also with MSSA bacteremia associated with tricuspid valve endocarditis, suffered right cerebellar CVA and had cholangitis/pancreatitis requiring ERCP.   Past Medical History:  Diagnosis Date   Allergy    Anemia    Anxiety    Baker's cyst of knee    Blood transfusion without reported diagnosis    as baby    Coronary artery disease    quadruple bypass - March 2016   GERD (gastroesophageal reflux disease)    Gouty arthritis    "real bad" (01/17/2013)   Heart murmur    Hypercholesteremia    Hypertension    MSSA bacteremia 01/29/2023   Myocardial infarction (HCC) 2017   PEA (Pulseless electrical activity) (HCC) 01/29/2023   Stroke (HCC)    Type II diabetes mellitus (HCC)     Subjective:   I have reviewed medical records including EPIC notes, labs and imaging, received updates from nursing and the provider, assessed the patient and then met in the family room with his  daughter, son, and son-in-law to discuss diagnosis prognosis, GOC, EOL wishes, disposition and options.  I introduced Palliative Medicine as specialized medical care for people living with serious illness. It focuses on providing relief from symptoms and stress of a serious illness. The goal is to improve quality of life for both the patient and the family.  Today's Discussion: Reviewed life history. Patient has two children and four grandchildren. He enjoyed detailing cars. He was independent before this admission-- completing all ADLs and IADLs.   His children have a good understanding of his chronic and acute conditions. They understand he is critically ill but are hopeful he will be weaned from ECMO. We discussed that even if he were to get off ECMO his recovery would be long. They understand this.  They have never discussed goals of care with their father as he did not like to talk about it. Before his surgery he did tell his son that he was afraid of the surgery and that he would want everything done to keep him alive. 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 We discussed time for outcomes, hoping for his recovery, but also preparing for if he worsens.  Discussed the importance of continued conversation with family and the medical providers regarding overall plan of care and treatment options, ensuring decisions  are within the context of the patient's values and GOCs.  Questions and concerns were addressed. The family was encouraged to call with questions or concerns. PMT will continue to support holistically.  Review of Systems  Unable to perform ROS   Objective:   Primary Diagnoses: Present on Admission:  Volume overload  Essential hypertension  GERD (gastroesophageal reflux disease)  CAD (coronary artery disease)  Class 1 obesity  Symptomatic anemia  Acute on chronic combined systolic and diastolic CHF (congestive heart failure) (HCC)   Physical Exam Constitutional:       Interventions: He is sedated and intubated.     Comments: ECMO  HENT:     Head: Normocephalic and atraumatic.  Pulmonary:     Effort: He is intubated.     Vital Signs:  BP 101/60   Pulse 72   Temp 98.1 F (36.7 C)   Resp 15   Ht 5\' 6"  (1.676 m)   Wt 87.6 kg   SpO2 99%   BMI 31.17 kg/m   Palliative Assessment/Data: 10%    Advanced Care Planning:   Existing Vynca/ACP Documentation: None  Primary Decision Maker: NEXT OF KIN. Patient's children Caryn Bee and Danford Bad are his decision makers.  Code Status/Advance Care Planning: Full code   Assessment & Plan:   SUMMARY OF RECOMMENDATIONS   Full code Full scope Time for outcomes Encouraged continued discussion re: goals of care PMT support  Discussed with: RN and Dr. Shirlee Latch  Time Total: 75 minutes   Thank you for allowing [place] to participate in the care of [name] PMT will continue to support holistically.   Signed by: Sarina Ser, NP Palliative Medicine Team  Team Phone # [phone] (Nights/Weekends)  05/22/2023, 4:52 PM

## 2023-05-22 NOTE — Progress Notes (Signed)
Patient ID: [name], male   DOB: Jun 10, 1961, 62 y.o.   MRN: [id number] Extracorporeal support note   ECLS support day: Indication: Post-op vasoplegia and pulmonary edema  Configuration: Central VA ECMO  Drainage cannula: RA Return cannula: Ascending aorta  Pump speed: 3000 rpm  Pump flow: 3.8 L/min Pump used: Cardiohelp  Sweep gas: 4.5 with blender to lower PaO2  Circuit check: Small amount of fibrin noted Anticoagulant: To start bivalirudin today Anticoagulation targets: PTT 50-80  Changes in support: none  Anticipated goals/duration of support: Wean to decannulation  .[name]  05/22/2023, 7:34 AM

## 2023-05-22 NOTE — Progress Notes (Signed)
     Referral received for [name]: on ECMO. Chart reviewed and updates received from RN. Patient assessed and is unable to engage appropriately in discussions.   I was able to speak with the patient's daughter [name]. She is waiting to hear from her brother. She will call PMT with time to meet.  Detailed note and recommendations to follow once GOC has been completed.   Thank you for your referral and allowing PMT to assist in [facility] care.   Sarina Ser, NP Palliative Medicine Team  Team Phone # [phone]   NO CHARGE

## 2023-05-22 NOTE — Progress Notes (Signed)
Pharmacy Antibiotic Note  [name] is a 62 y.o. male admitted on 05/17/2023 with bioprosthetic aortic valve dehiscence > s/p aortic root / valve repair possible endocarditis - s/p MSSA TV endocarditis 01/2023. Afebrile, wbc 12 esrd >CRRT / ECMO post op. Initial antibiotics: vancomycin + ceftriaxone.  WBC jump up to 50 tonight, will broaden antibiotic coverage and add meropenem. Vancomycin trough 17 at goal on CRRT   Plan: Vancomycin 1gm IV q24h Meropenem 1gm IV q8h  Height: 5\' 6"  (167.6 cm) Weight: 87.6 kg (193 lb 2 oz) IBW/kg (Calculated) : 63.8  Temp (24hrs), Avg:97.8 F (36.6 C), Min:97.3 F (36.3 C), Max:98.1 F (36.7 C)  Recent Labs  Lab 05/20/23 2118 05/21/23 0340 05/21/23 0351 05/21/23 0609 05/21/23 1440 05/21/23 1707 05/21/23 1720 05/22/23 0337 05/22/23 0351 05/22/23 1634 05/22/23 2042  WBC 25.0* 18.9*  --   --  24.1*  --   --  35.8*  --  50.7*  --   CREATININE 1.99* 1.91*  1.89*  --   --   --  1.56*  --  1.43*  --  1.31*  --   LATICACIDVEN  --   --    < > 1.3  --   --  1.2  --  1.5 1.7 1.7  VANCOTROUGH  --   --   --   --   --   --   --   --   --   --  17   < > = values in this interval not displayed.    Estimated Creatinine Clearance: 59.8 mL/min (A) (by C-G formula based on SCr of 1.31 mg/dL (H)).    Allergies  Allergen Reactions   Other Anaphylaxis    Mushrooms  Not listed on MAR    Plavix [Clopidogrel Bisulfate] Other (See Comments)    TTP Not listed on the [facility]   Fleet Enema [Enema] Other (See Comments)    Unknown reaction   Lovenox [Enoxaparin] Other (See Comments)    Unknown reaction   Morphine Other (See Comments)    Unknown reaction   Nsaids Other (See Comments)    Unknown reaction   Hydrocodone-Acetaminophen Itching    Antimicrobials this admission:   Dose adjustments this admission:   Microbiology results: 9/11 BCx: ngtd 9/11 MRSA pcr neg    Leota Sauers Pharm.D. CPP, BCPS Clinical Pharmacist [phone] 05/22/2023 9:52  PM

## 2023-05-22 NOTE — Progress Notes (Signed)
ANTICOAGULATION CONSULT NOTE - Follow up Consult  Pharmacy Consult for bivalirudin Indication:  VA ECMO  Allergies  Allergen Reactions   Other Anaphylaxis    Mushrooms  Not listed on MAR    Plavix [Clopidogrel Bisulfate] Other (See Comments)    TTP Not listed on the [facility]   Fleet Enema [Enema] Other (See Comments)    Unknown reaction   Lovenox [Enoxaparin] Other (See Comments)    Unknown reaction   Morphine Other (See Comments)    Unknown reaction   Nsaids Other (See Comments)    Unknown reaction   Hydrocodone-Acetaminophen Itching    Patient Measurements: Height: 5\' 6"  (167.6 cm) Weight: 87.6 kg (193 lb 2 oz) IBW/kg (Calculated) : 63.8 Heparin Dosing Weight: 81.3 kg  Vital Signs: Temp: 97.9 F (36.6 C) (09/15 2100) Pulse Rate: 77 (09/15 2100)  Labs: Recent Labs    05/20/23 2118 05/21/23 0014 05/21/23 0340 05/21/23 0613 05/21/23 1440 05/21/23 1707 05/21/23 1715 05/22/23 0337 05/22/23 0345 05/22/23 1356 05/22/23 1634 05/22/23 1641 05/22/23 2042  HGB 7.9*   < > 8.6*   < > 8.3*  --    < > 7.0*   < >  --  7.8* 8.2* 7.5*  HCT 23.2*   < > 25.8*   < > 24.3*  --    < > 20.9*   < >  --  22.8* 24.0* 22.0*  PLT 117*  --  81*  --  93*  --   --  58*  --   --  49*  --   --   APTT 68*  --  52*  --  37*  --    < > 38*  --  53*  --   --  62*  LABPROT 18.7*  --  16.8*  --   --   --   --  18.4*  --   --   --   --   --   INR 1.5*  --  1.3*  --   --   --   --  1.5*  --   --   --   --   --   HEPARINUNFRC  --   --   --   --  <0.10*  --   --   --   --   --   --   --   --   CREATININE 1.99*  --  1.91*  1.89*  --   --  1.56*  --  1.43*  --   --  1.31*  --   --    < > = values in this interval not displayed.    Estimated Creatinine Clearance: 59.8 mL/min (A) (by C-G formula based on SCr of 1.31 mg/dL (H)).   Medical History: Past Medical History:  Diagnosis Date   Allergy    Anemia    Anxiety    Baker's cyst of knee    Blood transfusion without reported diagnosis    as  baby    Coronary artery disease    quadruple bypass - March 2016   GERD (gastroesophageal reflux disease)    Gouty arthritis    "real bad" (01/17/2013)   Heart murmur    Hypercholesteremia    Hypertension    MSSA bacteremia 01/29/2023   Myocardial infarction (HCC) 2017   PEA (Pulseless electrical activity) (HCC) 01/29/2023   Stroke (HCC)    Type II diabetes mellitus (HCC)     Medications:  Scheduled:   sodium chloride  Intravenous Once   sodium chloride   Intravenous Once   acetaminophen  1,000 mg Oral Q6H   Or   acetaminophen (TYLENOL) oral liquid 160 mg/5 mL  1,000 mg Per Tube Q6H   acetaminophen (TYLENOL) oral liquid 160 mg/5 mL  650 mg Per Tube Once   aspirin  324 mg Oral Once   aspirin EC  325 mg Oral Daily   Or   aspirin  324 mg Per Tube Daily   atorvastatin  80 mg Per Tube q1800   bisacodyl  10 mg Oral Daily   Or   bisacodyl  10 mg Rectal Daily   Chlorhexidine Gluconate Cloth  6 each Topical Daily   docusate  100 mg Per Tube BID   ezetimibe  10 mg Per Tube Daily   fentaNYL (SUBLIMAZE) injection  50 mcg Intravenous Once   insulin aspart  0-15 Units Subcutaneous Q4H   mupirocin ointment  1 Application Nasal BID   mouth rinse  15 mL Mouth Rinse Q2H   pantoprazole (PROTONIX) IV  40 mg Intravenous QHS   polyethylene glycol  17 g Per Tube Daily   sodium chloride flush  3 mL Intravenous Q12H    Assessment: 62 yom underwent Bentall/AVR, TV repair, re-implantation of SVG-RCA complicated with vasoplegia and pulmonary edema requiring central VA ECMO. No AC PTA.  Hgb 7, plt 58 - received 1 unit PRBC today. Baseline aPTT 38 off anticoagulation. Fibrinogen 411, LDH up to 1020. Slight oozing at return cannula site - stable. Circuit with slight fibrin formation. Stable on CRRT currently. Started on bivalirudin 0.02mg /kg/hr today with aptt 62 sec at goal   Goal of Therapy:  aPTT 50-70 seconds Monitor platelets by anticoagulation protocol: Yes   Plan:  Continue bivalirudin  0.02 mg/kg/hr  Monitor q12 hr aPTT and CBC, and for s/sx of bleeding    Leota Sauers Pharm.D. CPP, BCPS Clinical Pharmacist [phone] 05/22/2023 9:55 PM    Please check AMION for all [facility] Pharmacy phone numbers After 10:00 PM, call Main Pharmacy [phone]

## 2023-05-22 NOTE — Progress Notes (Signed)
[facility] NEPHROLOGY PROGRESS NOTE  Assessment/ Plan: 25M recently recovered GFR from dialysis dependent AKI with dehiscence  of prosthetic aortic value and severe AI/TR.   # Anuric AKI/dialysis dependent AKI with fluid overload: Patient is status post cardiac surgery on 9/13 and currently on ECMO. He remains anuric with fluid overload.  Continue to run CRRT at current prescription.  UF as tolerated by BP and ECMO, Angiomax for anticoagulation.  Discussed with ICU nurses.  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    # Postcardiotomy vasoplegia/shock with failure to wean: Currently on ECMO per CHF team.  On pressors.  Volume managing with CRRT.  # Hyperkalemia: Managed with CRRT prescription and follow labs.  # Anemia: Transfuse as needed per primary team.  Subjective: Seen and examined ICU.  Currently has ECMO, CRRT and multiple lines.  Unable to do UF yesterday because of ECMO and hypotension.  Currently on multiple pressors.  Objective Vital signs in last 24 hours: Vitals:   05/22/23 0900 05/22/23 0915 05/22/23 0930 05/22/23 0936  BP:      Pulse:      Resp: 15 15 15 15   Temp:      TempSrc:      SpO2:      Weight:      Height:       Weight change: 4.5 kg  Intake/Output Summary (Last 24 hours) at 05/22/2023 0942 Last data filed at 05/22/2023 0900 Gross per 24 hour  Intake 4713.9 ml  Output 2922.3 ml  Net 1791.6 ml       Labs: RENAL PANEL Recent Labs  Lab 05/19/23 0636 05/19/23 1132 05/20/23 0310 05/20/23 0754 05/20/23 2036 05/20/23 2039 05/20/23 2118 05/21/23 0014 05/21/23 0340 05/21/23 4098 05/21/23 1707 05/21/23 1715 05/21/23 2009 05/21/23 2124 05/21/23 2352 05/22/23 0337 05/22/23 0345  NA 138   < > 134*   < > 144   < > 144   < > 140  140   < > 139   < > 139 138 138 137 138  K 5.0   < > 4.2   < > 5.2*   < > 5.5*   < > 5.3*  5.3*   < > 4.7    < > 4.5 4.4 4.2 3.9 4.1  CL 104   < > 104   < > 108  --  110  --  106  107  --  105  --   --   --   --  103  --   CO2 18*   < > 22  --   --   --  21*  --  22  21*  --  24  --   --   --   --  23  --   GLUCOSE 101*   < > 97   < > 91  --  112*  --  124*  123*  --  147*  --   --   --   --  135*  --   BUN 49*   < > 32*   < > 30*  --  32*  --  29*  30*  --  22  --   --   --   --  18  --   CREATININE 2.10*   < > 1.80*   < > 1.70*  --  1.99*  --  1.91*  1.89*  --  1.56*  --   --   --   --  1.43*  --   CALCIUM 8.6*   < > 8.3*  --   --   --  8.1*  --  7.8*  7.8*  --  7.8*  --   --   --   --  7.8*  --   MG 2.3  --  2.3  --   --   --  2.4  --  2.3  --   --   --   --   --   --   --   --   PHOS 4.0   < > 2.7  --   --   --  6.1*  --  5.4*  --  4.6  --   --   --   --  3.9  --   ALBUMIN 3.3*   < > 3.0*  --   --   --  <1.5*  --  2.6*  --  2.9*  --   --   --   --  2.9*  2.9*  --    < > = values in this interval not displayed.    Liver Function Tests: Recent Labs  Lab 05/17/23 0950 05/18/23 0411 05/19/23 0636 05/21/23 0340 05/21/23 1707 05/22/23 0337  AST 14* 13*  --   --   --  83*  ALT <5 8  --   --   --  12  ALKPHOS 98 97  --   --   --  39  BILITOT 1.5* 2.0*  --   --   --  1.3*  PROT 8.2* 8.5*  --   --   --  5.3*  ALBUMIN 3.5 3.5   < > 2.6* 2.9* 2.9*  2.9*   < > = values in this interval not displayed.   No results for input(s): "LIPASE", "AMYLASE" in the last 168 hours. No results for input(s): "AMMONIA" in the last 168 hours. CBC: Recent Labs    08/12/22 0934 01/28/23 2355 05/17/23 2116 05/18/23 0411 05/19/23 0636 05/20/23 0309 05/21/23 1440 05/21/23 1715 05/21/23 2009 05/21/23 2124 05/21/23 2352 05/22/23 0337 05/22/23 0345  HGB 10.2*   < > 7.6*   < > 8.1*   < > 8.3*   < > 7.5* 7.8* 7.1* 7.0* 7.5*  MCV 94   < >  --    < > 89.3   < > 85.6  --   --   --   --  86.4  --   VITAMINB12  --   --   --   --  701  --   --   --   --   --   --   --   --   FERRITIN 537*  --  2,200*  --    --   --   --   --   --   --   --   --   --   TIBC 264  --  204*  --   --   --   --   --   --   --   --   --   --   IRON 83  --  140  --   --   --   --   --   --   --   --   --   --    < > = values in this  interval not displayed.    Cardiac Enzymes: No results for input(s): "CKTOTAL", "CKMB", "CKMBINDEX", "TROPONINI" in the last 168 hours. CBG: Recent Labs  Lab 05/21/23 1712 05/21/23 2007 05/21/23 2351 05/22/23 0343 05/22/23 0740  GLUCAP 132* 129* 136* 122* 135*    Iron Studies: No results for input(s): "IRON", "TIBC", "TRANSFERRIN", "FERRITIN" in the last 72 hours. Studies/Results: DG CHEST PORT 1 VIEW  Result Date: 05/22/2023 CLINICAL DATA:  ECMO EXAM: PORTABLE CHEST - 1 VIEW COMPARISON:  the previous day's study FINDINGS: Endotracheal tube tip less than 2 cm above carina. Gastric tube to the decompressed stomach. Stable right IJ hemodialysis catheter, bilateral chest tubes, right atrial and Left subclavian cannulae. The left IJ Swan-Ganz is retracted to the right atrium. Diffuse of the earlier opacities throughout the lungs as before. No pneumothorax or significant effusion. Heart size and mediastinal contours are within normal limits. Visualized bones unremarkable. IMPRESSION: 1. Endotracheal tube tip less than 2 cm above carina. 2. Retraction of Swan-Ganz catheter to the right atrium. 3. Diffuse bilateral pulmonary opacities as before. Electronically Signed   By: Corlis Leak M.D.   On: 05/22/2023 08:51   DG CHEST PORT 1 VIEW  Result Date: 05/22/2023 CLINICAL DATA:  Ventilator dependence. EXAM: PORTABLE CHEST 1 VIEW COMPARISON:  Earlier same day FINDINGS: Endotracheal tube tip is 2.3 cm above the base of the carina. The NG tube passes into the stomach although the distal tip position is not included on the film. Right IJ central line tip overlies the mid SVC level. Bilateral chest tubes noted without evidence for pneumothorax. Left IJ presumed pulmonary artery catheter is looped in the  right atrium with tip position in the region of the tricuspid valve. Presumed ECMO cannula again noted. Diffuse bilateral airspace disease, right greater than left, compatible with edema. IMPRESSION: 1. Left IJ presumed pulmonary artery catheter is looped in the right atrium with tip position in the region of the tricuspid valve. 2. Otherwise stable exam. No pneumothorax. Electronically Signed   By: Kennith Center M.D.   On: 05/22/2023 08:14   DG Chest Port 1 View  Result Date: 05/21/2023 CLINICAL DATA:  Status post aortic valve replacement. Follow-up exam. EXAM: PORTABLE CHEST 1 VIEW COMPARISON:  05/20/2023 and older studies. FINDINGS: Hazy bilateral airspace lung opacities are without substantial change from the previous day's exam, consistent pulmonary edema. Endotracheal tube tip lies 1.4 cm above the carina. Remaining support apparatus including tunneled right internal jugular central venous catheter, left internal jugular Swan-Ganz catheter, mediastinal tube and bilateral chest tubes as well as cardiac support device are all unchanged. No pneumothorax. IMPRESSION: 1. Endotracheal tube tip appears retracted from the previous day's study, now well positioned. 2. No other change. Persistent bilateral airspace lung opacities consistent with pulmonary edema. Stable remaining support apparatus is Electronically Signed   By: Amie Portland M.D.   On: 05/21/2023 10:10   DG Chest Port 1 View  Result Date: 05/20/2023 CLINICAL DATA:  Status post AVR and tricuspid valve repair EXAM: PORTABLE CHEST 1 VIEW COMPARISON:  05/19/2023 FINDINGS: Postsurgical changes are noted. Cardiac shadow is stable. Swan-Ganz catheter is noted. Endotracheal tube is seen at the level of the carina and should be withdrawn 2 cm. Bilateral chest tubes as well as a pericardial and mediastinal drain are seen. Cannula is noted over the right atrium extending inferiorly. No pneumothorax is noted. Central vascular congestion is seen with edema  noted bilaterally worse on the right than the left. No pneumothorax is seen. Cannulas are noted  overlying the upper left chest with hemostats associated. IMPRESSION: Postsurgical changes as described. Central vascular congestion as well as pulmonary edema worse on the right than the left. No pneumothorax is seen. Endotracheal tube is at the level of the carina and should be withdrawn 2 cm. These results will be called to the ordering clinician or representative by the Radiologist Assistant, and communication documented in the PACS or [employer]. Electronically Signed   By: Alcide Clever M.D.   On: 05/20/2023 23:02    Medications: Infusions:   prismasol BGK 4/2.5 400 mL/hr at 05/21/23 2252   sodium chloride 20 mL/hr at 05/22/23 0900   sodium chloride     sodium chloride 20 mL/hr at 05/22/23 0900   sodium chloride     anticoagulant sodium citrate     bivalirudin (ANGIOMAX) 250 mg in sodium chloride 0.9 % 500 mL (0.5 mg/mL) infusion 0.02 mg/kg/hr (05/22/23 0900)   cefTRIAXone (ROCEPHIN)  IV Stopped (05/21/23 1957)   dexmedetomidine (PRECEDEX) IV infusion 0.5 mcg/kg/hr (05/22/23 0900)   epinephrine 5 mcg/min (05/22/23 0900)   fentaNYL infusion INTRAVENOUS 200 mcg/hr (05/22/23 0900)   insulin Stopped (05/21/23 0754)   lactated ringers     lactated ringers 20 mL/hr at 05/22/23 0900   magnesium sulfate     midazolam 10 mg/hr (05/22/23 0900)   milrinone     norepinephrine (LEVOPHED) Adult infusion 1 mcg/min (05/22/23 0900)   prismasol BGK 2/2.5 dialysis solution 1,500 mL/hr at 05/22/23 0933   prismasol BGK 2/2.5 replacement solution 400 mL/hr at 05/21/23 2121   vancomycin Stopped (05/21/23 2215)   vasopressin 0.03 Units/min (05/22/23 0922)    Scheduled Medications:  sodium chloride   Intravenous Once   sodium chloride   Intravenous Once   acetaminophen  1,000 mg Oral Q6H   Or   acetaminophen (TYLENOL) oral liquid 160 mg/5 mL  1,000 mg Per Tube Q6H   acetaminophen (TYLENOL) oral liquid  160 mg/5 mL  650 mg Per Tube Once   aspirin  324 mg Oral Once   aspirin EC  325 mg Oral Daily   Or   aspirin  324 mg Per Tube Daily   atorvastatin  80 mg Per Tube q1800   bisacodyl  10 mg Oral Daily   Or   bisacodyl  10 mg Rectal Daily   Chlorhexidine Gluconate Cloth  6 each Topical Daily   docusate  100 mg Per Tube BID   ezetimibe  10 mg Per Tube Daily   fentaNYL (SUBLIMAZE) injection  50 mcg Intravenous Once   insulin aspart  0-15 Units Subcutaneous Q4H   metoCLOPramide (REGLAN) injection  10 mg Intravenous Q6H   mupirocin ointment  1 Application Nasal BID   mouth rinse  15 mL Mouth Rinse Q2H   pantoprazole (PROTONIX) IV  40 mg Intravenous QHS   polyethylene glycol  17 g Per Tube Daily   sodium chloride flush  3 mL Intravenous Q12H    have reviewed scheduled and prn medications.  Physical Exam: General: Critically ill looking male, intubated, sedated, multiple lines Heart:RRR, s1s2 nl Lungs: Multiple lines and ECMO in the chest, coarse breath sound Abdomen:soft,  non-distended Extremities: Peripheral edema present Dialysis Access: HD line   Maryjane Benedict Jaynie Collins 05/22/2023,9:42 AM  LOS: 4 days

## 2023-05-22 NOTE — Progress Notes (Addendum)
ANTICOAGULATION CONSULT NOTE - Initial Consult  Pharmacy Consult for bivalirudin Indication:  VA ECMO  Allergies  Allergen Reactions   Other Anaphylaxis    Mushrooms  Not listed on MAR    Plavix [Clopidogrel Bisulfate] Other (See Comments)    TTP Not listed on the [facility]   Fleet Enema [Enema] Other (See Comments)    Unknown reaction   Lovenox [Enoxaparin] Other (See Comments)    Unknown reaction   Morphine Other (See Comments)    Unknown reaction   Nsaids Other (See Comments)    Unknown reaction   Hydrocodone-Acetaminophen Itching    Patient Measurements: Height: 5\' 6"  (167.6 cm) Weight: 87.6 kg (193 lb 2 oz) IBW/kg (Calculated) : 63.8 Heparin Dosing Weight: 81.3 kg  Vital Signs: Temp: 97.5 F (36.4 C) (09/15 0700) Pulse Rate: 76 (09/15 0700)  Labs: Recent Labs    05/20/23 2118 05/21/23 0014 05/21/23 0340 05/21/23 0613 05/21/23 1440 05/21/23 1707 05/21/23 1715 05/21/23 2004 05/21/23 2009 05/21/23 2352 05/22/23 0337 05/22/23 0345  HGB 7.9*   < > 8.6*   < > 8.3*  --    < >  --    < > 7.1* 7.0* 7.5*  HCT 23.2*   < > 25.8*   < > 24.3*  --    < >  --    < > 21.0* 20.9* 22.0*  PLT 117*  --  81*  --  93*  --   --   --   --   --  58*  --   APTT 68*  --  52*  --  37*  --   --  37*  --   --  38*  --   LABPROT 18.7*  --  16.8*  --   --   --   --   --   --   --  18.4*  --   INR 1.5*  --  1.3*  --   --   --   --   --   --   --  1.5*  --   HEPARINUNFRC  --   --   --   --  <0.10*  --   --   --   --   --   --   --   CREATININE 1.99*  --  1.91*  1.89*  --   --  1.56*  --   --   --   --  1.43*  --    < > = values in this interval not displayed.    Estimated Creatinine Clearance: 54.8 mL/min (A) (by C-G formula based on SCr of 1.43 mg/dL (H)).   Medical History: Past Medical History:  Diagnosis Date   Allergy    Anemia    Anxiety    Baker's cyst of knee    Blood transfusion without reported diagnosis    as baby    Coronary artery disease    quadruple bypass -  March 2016   GERD (gastroesophageal reflux disease)    Gouty arthritis    "real bad" (01/17/2013)   Heart murmur    Hypercholesteremia    Hypertension    MSSA bacteremia 01/29/2023   Myocardial infarction (HCC) 2017   PEA (Pulseless electrical activity) (HCC) 01/29/2023   Stroke (HCC)    Type II diabetes mellitus (HCC)     Medications:  Scheduled:   sodium chloride   Intravenous Once   sodium chloride   Intravenous Once   acetaminophen  1,000 mg  Oral Q6H   Or   acetaminophen (TYLENOL) oral liquid 160 mg/5 mL  1,000 mg Per Tube Q6H   acetaminophen (TYLENOL) oral liquid 160 mg/5 mL  650 mg Per Tube Once   aspirin  324 mg Oral Once   aspirin EC  325 mg Oral Daily   Or   aspirin  324 mg Per Tube Daily   atorvastatin  80 mg Per Tube q1800   bisacodyl  10 mg Oral Daily   Or   bisacodyl  10 mg Rectal Daily   Chlorhexidine Gluconate Cloth  6 each Topical Daily   docusate  100 mg Per Tube BID   ezetimibe  10 mg Per Tube Daily   fentaNYL (SUBLIMAZE) injection  50 mcg Intravenous Once   insulin aspart  0-15 Units Subcutaneous Q4H   metoCLOPramide (REGLAN) injection  10 mg Intravenous Q6H   mupirocin ointment  1 Application Nasal BID   mouth rinse  15 mL Mouth Rinse Q2H   pantoprazole (PROTONIX) IV  40 mg Intravenous QHS   polyethylene glycol  17 g Per Tube Daily   sodium chloride flush  3 mL Intravenous Q12H    Assessment: 62 yom underwent Bentall/AVR, TV repair, re-implantation of SVG-RCA complicated with vasoplegia and pulmonary edema requiring central VA ECMO. No AC PTA.  Hgb 7, plt 58 - received 1 unit PRBC today. Baseline aPTT 38 off anticoagulation. Fibrinogen 411, LDH up to 1020. Slight oozing at return cannula site. Circuit with slight fibrin formation. Stable on CRRT currently.  Goal of Therapy:  aPTT 50-70 seconds Monitor platelets by anticoagulation protocol: Yes   Plan:  Start bivalirudin 0.02 mg/kg/hr  Will order aPTT in 6 hours Monitor q12 hr aPTT once stable,  CBC, and for s/sx of bleeding   Thank you for allowing pharmacy to participate in this patient's care,  Sherron Monday, PharmD, BCCCP Clinical Pharmacist  Phone: [phone] 05/22/2023 7:29 AM  Please check AMION for all [facility] Pharmacy phone numbers After 10:00 PM, call Main Pharmacy [phone]  ADDENDUM Initial aPTT came back therapeutic at 53, on bivalirudin@0 .02 mg/kg/hr. Continued slight oozing at return cannula site - circuit remains stable. No issues with infusion. Will continue at same rate and confirm in 6 hrs.   Thank you for allowing pharmacy to participate in this patient's care,  Sherron Monday, PharmD, BCCCP Clinical Pharmacist

## 2023-05-22 NOTE — Progress Notes (Signed)
OT Cancellation Note  Patient Details Name: [name] MRN: [id number] DOB: Nov 24, 1960   Cancelled Treatment:    Reason Eval/Treat Not Completed: Patient not medically ready (on ECMO, intubated, will check back for OT evaluation as appropriate)  Carver Fila, OTD, OTR/L [facility] [phone]   Yashua Bracco K Koonce 05/22/2023, 8:04 AM

## 2023-05-22 NOTE — Progress Notes (Signed)
This ES received this patient from OR staff at 2100. Flow goal listed as 4-5 LPM. After ~2 hours on unit, this goal became unachievable. For rest of night, the patient generally maintained flows of 3.4-4.1 LPM. I did not attempt to flow higher than 4.1 after initial drop in flows--negative pressure on drainage cannula made this impossible. Based on post-op TEE & body habitus, the patient likely doesn't require higher flows. Additionally, hemodynamics & lab work are suggestive of improved end-organ perfusion despite lower flows.  CRRT attempted shortly after arrival to unit. For approx. 1 hour, the patient tolerated UF pull. Due to recurrent negative venous alarms, the patient was only hemofiltered for remainder of night--no additional volume was able to be removed.  Despite zero fluid removal on CRRT, patient's vasoplegia persisted. Frequent negative venous alarms were present anytime volume (blood products/albumin) wasn't infusing, with flows dropping to ~2-3 LPM. Although conservative fluid resuscitation was ideal, additional volume was given to maintain adequate flows. 4 albumins were given on my shift with a 5th being started at shift change. MD made aware of above information during morning rounds.

## 2023-05-22 NOTE — Progress Notes (Signed)
Patient ID: [name], male   DOB: 18-Dec-1960, 62 y.o.   MRN: [id number]     Advanced Heart Failure Rounding Note  PCP-Cardiologist: Kristeen Miss, MD   Subjective:    9/13: OR for Bentall/AVR, TV repair, re-implantation of SVG-RCA.  Post-op vasoplegia and pulmonary edema, required central VA ECMO (RA drainage, ascending aorta return.  Post-op TEE EF 55%, mild-moderate RV hypokinesis.  9/14: Blender added to circuit due to high PaO2  MAP around 70 on epinephrine 5, NE 1,  and vasopressin 0.02.  CVVH running but net positive yesterday due to chugging.   H/o MSSA endocarditis, currently on ceftriaxone/vancomycin.   Hgb 7, plts 58 => given 1 unit PRBCs this morning.   Ernestine Conrad noted to be in right atrium, advanced at bedside.  CVP 9.  CXR with diffuse bilateral pulmonary edema.   A-V sequential pacing, underlying ?atrial fibrillation when pacing held.   VA ECMO 3000 rpm Flow 3.8 L/min pVen -22 Delta P 20 Sweep 4.5 LDH 775 => 1020 Lactate 1.3 => 1.5 ABG 7.40/39/239   Objective:   Weight Range: 87.6 kg Body mass index is 31.17 kg/m.   Vital Signs:   Temp:  [97.3 F (36.3 C)-98.8 F (37.1 C)] 97.5 F (36.4 C) (09/15 0700) Pulse Rate:  [73-130] 76 (09/15 0700) Resp:  [0-18] 0 (09/15 0700) SpO2:  [98 %-100 %] 99 % (09/15 0700) Arterial Line BP: (77-98)/(67-85) 86/72 (09/15 0700) FiO2 (%):  [50 %] 50 % (09/15 0600) Weight:  [87.6 kg] 87.6 kg (09/15 0615) Last BM Date : 05/18/23  Weight change: Filed Weights   05/08/2023 0545 05/21/23 0615 05/22/23 0615  Weight: 84.8 kg 83.1 kg 87.6 kg    Intake/Output:   Intake/Output Summary (Last 24 hours) at 05/22/2023 0736 Last data filed at 05/22/2023 0700 Gross per 24 hour  Intake 4849.61 ml  Output 2753.2 ml  Net 2096.41 ml      Physical Exam    General: Intubated/sedated Neck: Thick, JVP not visualized.  Lungs: Decreased bilaterally.  CV: Nondisplaced PMI.  Heart irregular S1/S2, no S3/S4, no murmur.  No peripheral  edema.   Abdomen: Soft, nontender, no hepatosplenomegaly, no distention.  Skin: Intact without lesions or rashes.  Neurologic: Sedated on vent Extremities: No clubbing or cyanosis.  HEENT: Normal.   Telemetry   A-V sequential pacing => underlying ?atrial fibrillation rate 80s (personally reviewed)   Labs    CBC Recent Labs    05/21/23 1440 05/21/23 1715 05/22/23 0337 05/22/23 0345  WBC 24.1*  --  35.8*  --   HGB 8.3*   < > 7.0* 7.5*  HCT 24.3*   < > 20.9* 22.0*  MCV 85.6  --  86.4  --   PLT 93*  --  58*  --    < > = values in this interval not displayed.   Basic Metabolic Panel Recent Labs    13/24/40 2118 05/21/23 0014 05/21/23 0340 05/21/23 0613 05/21/23 1707 05/21/23 1715 05/22/23 0337 05/22/23 0345  NA 144   < > 140  140   < > 139   < > 137 138  K 5.5*   < > 5.3*  5.3*   < > 4.7   < > 3.9 4.1  CL 110  --  106  107  --  105  --  103  --   CO2 21*  --  22  21*  --  24  --  23  --   GLUCOSE 112*  --  124*  123*  --  147*  --  135*  --   BUN 32*  --  29*  30*  --  22  --  18  --   CREATININE 1.99*  --  1.91*  1.89*  --  1.56*  --  1.43*  --   CALCIUM 8.1*  --  7.8*  7.8*  --  7.8*  --  7.8*  --   MG 2.4  --  2.3  --   --   --   --   --   PHOS 6.1*  --  5.4*  --  4.6  --  3.9  --    < > = values in this interval not displayed.   Liver Function Tests Recent Labs    05/21/23 1707 05/22/23 0337  AST  --  83*  ALT  --  12  ALKPHOS  --  39  BILITOT  --  1.3*  PROT  --  5.3*  ALBUMIN 2.9* 2.9*  2.9*   No results for input(s): "LIPASE", "AMYLASE" in the last 72 hours. Cardiac Enzymes No results for input(s): "CKTOTAL", "CKMB", "CKMBINDEX", "TROPONINI" in the last 72 hours.  BNP: BNP (last 3 results) Recent Labs    05/11/2023 1100  BNP 2,097.0*    ProBNP (last 3 results) No results for input(s): "PROBNP" in the last 8760 hours.   D-Dimer No results for input(s): "DDIMER" in the last 72 hours. Hemoglobin A1C Recent Labs    05/15/2023 0309   HGBA1C 6.1*   Fasting Lipid Panel No results for input(s): "CHOL", "HDL", "LDLCALC", "TRIG", "CHOLHDL", "LDLDIRECT" in the last 72 hours. Thyroid Function Tests No results for input(s): "TSH", "T4TOTAL", "T3FREE", "THYROIDAB" in the last 72 hours.  Invalid input(s): "FREET3"  Other results:   Imaging    No results found.   Medications:     Scheduled Medications:  sodium chloride   Intravenous Once   sodium chloride   Intravenous Once   acetaminophen  1,000 mg Oral Q6H   Or   acetaminophen (TYLENOL) oral liquid 160 mg/5 mL  1,000 mg Per Tube Q6H   acetaminophen (TYLENOL) oral liquid 160 mg/5 mL  650 mg Per Tube Once   aspirin  324 mg Oral Once   aspirin EC  325 mg Oral Daily   Or   aspirin  324 mg Per Tube Daily   atorvastatin  80 mg Per Tube q1800   bisacodyl  10 mg Oral Daily   Or   bisacodyl  10 mg Rectal Daily   Chlorhexidine Gluconate Cloth  6 each Topical Daily   docusate  100 mg Per Tube BID   ezetimibe  10 mg Per Tube Daily   fentaNYL (SUBLIMAZE) injection  50 mcg Intravenous Once   insulin aspart  0-15 Units Subcutaneous Q4H   metoCLOPramide (REGLAN) injection  10 mg Intravenous Q6H   mupirocin ointment  1 Application Nasal BID   mouth rinse  15 mL Mouth Rinse Q2H   pantoprazole (PROTONIX) IV  40 mg Intravenous QHS   polyethylene glycol  17 g Per Tube Daily   sodium chloride flush  3 mL Intravenous Q12H    Infusions:   prismasol BGK 4/2.5 400 mL/hr at 05/21/23 2252   sodium chloride 20 mL/hr at 05/22/23 0700   sodium chloride     sodium chloride 20 mL/hr at 05/22/23 0700   sodium chloride     anticoagulant sodium citrate     bivalirudin (ANGIOMAX) 250 mg in  sodium chloride 0.9 % 500 mL (0.5 mg/mL) infusion     cefTRIAXone (ROCEPHIN)  IV Stopped (05/21/23 1957)   dexmedetomidine (PRECEDEX) IV infusion 0.5 mcg/kg/hr (05/22/23 0700)   epinephrine 5 mcg/min (05/22/23 0700)   fentaNYL infusion INTRAVENOUS 200 mcg/hr (05/22/23 0700)   insulin Stopped  (05/21/23 0754)   lactated ringers     lactated ringers 20 mL/hr at 05/22/23 0700   magnesium sulfate     midazolam 10 mg/hr (05/22/23 0700)   milrinone     norepinephrine (LEVOPHED) Adult infusion 1 mcg/min (05/22/23 0700)   prismasol BGK 2/2.5 dialysis solution 1,500 mL/hr at 05/22/23 0528   prismasol BGK 2/2.5 replacement solution 400 mL/hr at 05/21/23 2121   vancomycin Stopped (05/21/23 2215)   vasopressin 0.03 Units/min (05/22/23 0700)    PRN Medications: sodium chloride, anticoagulant sodium citrate, artificial tears, dextrose, fentaNYL, metoprolol tartrate, midazolam, midazolam, morphine injection, ondansetron (ZOFRAN) IV, mouth rinse, oxyCODONE, sodium chloride flush, traMADol    Assessment/Plan   1. Post-cardiotomy vasoplegia/shock with failure to wean from CPB: Now on central VA ECMO (06/04/2023) with RA drainage/ascending aorta return. Post-op echo with EF 55%, mild-moderate RV dysfunction. Unable to get images yesterday by TTE.  MAP around 70 now on epinephrine 5 + NE 1 + vasopressin 0.02, pulsatile arterial line.  CXR still with diffuse pulmonary edema.  I/Os positive again yesterday due to chugging when we try to pull via CVVH.   Ernestine Conrad advanced, will get pCXR.  - Need to pull via CVVH today to clear lungs.  We decreased ECMO flow to 3.4 to see if we can pull CVVH without chugging, can lower further as long as MAP tolerates.   - Continue current pressor support to allow CVVH.   - Start bivalirudin today with goal PTT 50-70.  - Will aim for TEE tomorrow for better cardiac visualization.  - Decannulation once lungs more clear.  2. Acute hypoxic respiratory failure, post-op: Diffuse pulmonary edema.  On vent, rest settings.  CXR with diffuse pulmonary edema. Blender added to keep PaO2 down, ABG stable today.  - As above, lowering ECMO flow to 3.4 L/min, will try to pull net negative gradually via CVVH.  3. Severe prosthetic AI: Due to previous MSSA endocarditis with partial valve  dehiscence s/p re-do AVR/Bentall with reimplantation of SVG-RCA and TV repair 05/09/2023  - Continuing ceftriaxone/vancomycin for MSSA coverage.  4. CAD s/p CABG/AVR 2017:  s/p AVR/Bentall and re-implantation of SVG-RCA 06/04/2023. 5. ESRD: CVVH as above.  6. DM2: management per CCM 7. Heart block: Post-op, now has underlying rhythm (?atrial fibrillation).   - Will keep pacing to backup for now.  - ECG.   8. H/o CVA: Prior cerebellar CVA.  9. Thrombocytopenia: Post-op, 58 K today.   10. Anemia: Post-op, transfuse hgb < 8.   CRITICAL CARE Performed by: [name]  Total critical care time: 45 minutes  Critical care time was exclusive of separately billable procedures and treating other patients.  Critical care was necessary to treat or prevent imminent or life-threatening deterioration.  Critical care was time spent personally by me on the following activities: development of treatment plan with patient and/or surrogate as well as nursing, discussions with consultants, evaluation of patient's response to treatment, examination of patient, obtaining history from patient or surrogate, ordering and performing treatments and interventions, ordering and review of laboratory studies, ordering and review of radiographic studies, pulse oximetry and re-evaluation of patient's condition.    Length of Stay: 4  [name], MD  05/22/2023, 7:36 AM  Advanced Heart Failure Team Pager [phone] (M-F; 7a - 5p)  Please contact [facility] for night-coverage after hours (5p -7a ) and weekends on [email or web address]

## 2023-05-23 ENCOUNTER — Inpatient Hospital Stay (HOSPITAL_COMMUNITY): Payer: PPO

## 2023-05-23 DIAGNOSIS — R570 Cardiogenic shock: Secondary | ICD-10-CM | POA: Diagnosis not present

## 2023-05-23 DIAGNOSIS — E8770 Fluid overload, unspecified: Secondary | ICD-10-CM | POA: Diagnosis not present

## 2023-05-23 DIAGNOSIS — I361 Nonrheumatic tricuspid (valve) insufficiency: Secondary | ICD-10-CM | POA: Diagnosis not present

## 2023-05-23 LAB — CBC
HCT: 21.8 % — ABNORMAL LOW (ref 39.0–52.0)
HCT: 25.2 % — ABNORMAL LOW (ref 39.0–52.0)
HCT: 25.1 % — ABNORMAL LOW (ref 39.0–52.0)
Hemoglobin: 7.3 g/dL — ABNORMAL LOW (ref 13.0–17.0)
Hemoglobin: 8.5 g/dL — ABNORMAL LOW (ref 13.0–17.0)
Hemoglobin: 8.4 g/dL — ABNORMAL LOW (ref 13.0–17.0)
MCH: 29.4 pg (ref 26.0–34.0)
MCH: 29.6 pg (ref 26.0–34.0)
MCH: 29.5 pg (ref 26.0–34.0)
MCHC: 33.5 g/dL (ref 30.0–36.0)
MCHC: 33.7 g/dL (ref 30.0–36.0)
MCHC: 33.5 g/dL (ref 30.0–36.0)
MCV: 87.9 fL (ref 80.0–100.0)
MCV: 87.8 fL (ref 80.0–100.0)
MCV: 88.1 fL (ref 80.0–100.0)
Platelets: 43 10*3/uL — ABNORMAL LOW (ref 150–400)
Platelets: 39 10*3/uL — ABNORMAL LOW (ref 150–400)
Platelets: 40 10*3/uL — ABNORMAL LOW (ref 150–400)
RBC: 2.48 MIL/uL — ABNORMAL LOW (ref 4.22–5.81)
RBC: 2.87 MIL/uL — ABNORMAL LOW (ref 4.22–5.81)
RBC: 2.85 MIL/uL — ABNORMAL LOW (ref 4.22–5.81)
RDW: 18 % — ABNORMAL HIGH (ref 11.5–15.5)
RDW: 17.3 % — ABNORMAL HIGH (ref 11.5–15.5)
RDW: 17.9 % — ABNORMAL HIGH (ref 11.5–15.5)
WBC: 54.5 10*3/uL (ref 4.0–10.5)
WBC: 53.8 10*3/uL (ref 4.0–10.5)
WBC: 53.7 10*3/uL (ref 4.0–10.5)
nRBC: 1.1 % — ABNORMAL HIGH (ref 0.0–0.2)
nRBC: 1.1 % — ABNORMAL HIGH (ref 0.0–0.2)
nRBC: 1.1 % — ABNORMAL HIGH (ref 0.0–0.2)

## 2023-05-23 LAB — POCT I-STAT 7, (LYTES, BLD GAS, ICA,H+H)
Acid-Base Excess: 1 mmol/L (ref 0.0–2.0)
Acid-Base Excess: 2 mmol/L (ref 0.0–2.0)
Acid-Base Excess: 1 mmol/L (ref 0.0–2.0)
Acid-Base Excess: 0 mmol/L (ref 0.0–2.0)
Acid-Base Excess: 0 mmol/L (ref 0.0–2.0)
Bicarbonate: 25.5 mmol/L (ref 20.0–28.0)
Bicarbonate: 26.3 mmol/L (ref 20.0–28.0)
Bicarbonate: 25.4 mmol/L (ref 20.0–28.0)
Bicarbonate: 24.6 mmol/L (ref 20.0–28.0)
Bicarbonate: 24.3 mmol/L (ref 20.0–28.0)
Calcium, Ion: 1.14 mmol/L — ABNORMAL LOW (ref 1.15–1.40)
Calcium, Ion: 1.12 mmol/L — ABNORMAL LOW (ref 1.15–1.40)
Calcium, Ion: 1.12 mmol/L — ABNORMAL LOW (ref 1.15–1.40)
Calcium, Ion: 1.13 mmol/L — ABNORMAL LOW (ref 1.15–1.40)
Calcium, Ion: 1.14 mmol/L — ABNORMAL LOW (ref 1.15–1.40)
HCT: 23 % — ABNORMAL LOW (ref 39.0–52.0)
HCT: 21 % — ABNORMAL LOW (ref 39.0–52.0)
HCT: 23 % — ABNORMAL LOW (ref 39.0–52.0)
HCT: 25 % — ABNORMAL LOW (ref 39.0–52.0)
HCT: 26 % — ABNORMAL LOW (ref 39.0–52.0)
Hemoglobin: 7.8 g/dL — ABNORMAL LOW (ref 13.0–17.0)
Hemoglobin: 7.1 g/dL — ABNORMAL LOW (ref 13.0–17.0)
Hemoglobin: 7.8 g/dL — ABNORMAL LOW (ref 13.0–17.0)
Hemoglobin: 8.5 g/dL — ABNORMAL LOW (ref 13.0–17.0)
Hemoglobin: 8.8 g/dL — ABNORMAL LOW (ref 13.0–17.0)
O2 Saturation: 100 %
O2 Saturation: 100 %
O2 Saturation: 100 %
O2 Saturation: 100 %
O2 Saturation: 100 %
Patient temperature: 36.9
Patient temperature: 36.6
Patient temperature: 36.8
Patient temperature: 36.6
Patient temperature: 37
Potassium: 3.6 mmol/L (ref 3.5–5.1)
Potassium: 4 mmol/L (ref 3.5–5.1)
Potassium: 4.1 mmol/L (ref 3.5–5.1)
Potassium: 4.1 mmol/L (ref 3.5–5.1)
Potassium: 4.1 mmol/L (ref 3.5–5.1)
Sodium: 137 mmol/L (ref 135–145)
Sodium: 136 mmol/L (ref 135–145)
Sodium: 135 mmol/L (ref 135–145)
Sodium: 137 mmol/L (ref 135–145)
Sodium: 138 mmol/L (ref 135–145)
TCO2: 27 mmol/L (ref 22–32)
TCO2: 27 mmol/L (ref 22–32)
TCO2: 27 mmol/L (ref 22–32)
TCO2: 26 mmol/L (ref 22–32)
TCO2: 25 mmol/L (ref 22–32)
pCO2 arterial: 39.2 mmHg (ref 32–48)
pCO2 arterial: 39.7 mmHg (ref 32–48)
pCO2 arterial: 38.5 mmHg (ref 32–48)
pCO2 arterial: 37.9 mmHg (ref 32–48)
pCO2 arterial: 38.8 mmHg (ref 32–48)
pH, Arterial: 7.42 (ref 7.35–7.45)
pH, Arterial: 7.427 (ref 7.35–7.45)
pH, Arterial: 7.426 (ref 7.35–7.45)
pH, Arterial: 7.419 (ref 7.35–7.45)
pH, Arterial: 7.404 (ref 7.35–7.45)
pO2, Arterial: 168 mmHg — ABNORMAL HIGH (ref 83–108)
pO2, Arterial: 170 mmHg — ABNORMAL HIGH (ref 83–108)
pO2, Arterial: 173 mmHg — ABNORMAL HIGH (ref 83–108)
pO2, Arterial: 169 mmHg — ABNORMAL HIGH (ref 83–108)
pO2, Arterial: 188 mmHg — ABNORMAL HIGH (ref 83–108)

## 2023-05-23 LAB — RENAL FUNCTION PANEL
Albumin: 2.4 g/dL — ABNORMAL LOW (ref 3.5–5.0)
Albumin: 2.4 g/dL — ABNORMAL LOW (ref 3.5–5.0)
Anion gap: 10 (ref 5–15)
Anion gap: 13 (ref 5–15)
BUN: 13 mg/dL (ref 8–23)
BUN: 12 mg/dL (ref 8–23)
CO2: 23 mmol/L (ref 22–32)
CO2: 23 mmol/L (ref 22–32)
Calcium: 7.9 mg/dL — ABNORMAL LOW (ref 8.9–10.3)
Calcium: 8 mg/dL — ABNORMAL LOW (ref 8.9–10.3)
Chloride: 102 mmol/L (ref 98–111)
Chloride: 101 mmol/L (ref 98–111)
Creatinine, Ser: 1.25 mg/dL — ABNORMAL HIGH (ref 0.61–1.24)
Creatinine, Ser: 1.26 mg/dL — ABNORMAL HIGH (ref 0.61–1.24)
GFR, Estimated: >60 mL/min (ref >60)
GFR, Estimated: >60 mL/min (ref >60)
Glucose, Bld: 114 mg/dL — ABNORMAL HIGH (ref 70–99)
Glucose, Bld: 108 mg/dL — ABNORMAL HIGH (ref 70–99)
Phosphorus: 2.3 mg/dL — ABNORMAL LOW (ref 2.5–4.6)
Phosphorus: 2.1 mg/dL — ABNORMAL LOW (ref 2.5–4.6)
Potassium: 3.4 mmol/L — ABNORMAL LOW (ref 3.5–5.1)
Potassium: 3.9 mmol/L (ref 3.5–5.1)
Sodium: 135 mmol/L (ref 135–145)
Sodium: 137 mmol/L (ref 135–145)

## 2023-05-23 LAB — HEPATIC FUNCTION PANEL
ALT: 12 U/L (ref 0–44)
AST: 82 U/L — ABNORMAL HIGH (ref 15–41)
Albumin: 2.4 g/dL — ABNORMAL LOW (ref 3.5–5.0)
Alkaline Phosphatase: 73 U/L (ref 38–126)
Bilirubin, Direct: 0.4 mg/dL — ABNORMAL HIGH (ref 0.0–0.2)
Indirect Bilirubin: 0.8 mg/dL (ref 0.3–0.9)
Total Bilirubin: 1.2 mg/dL (ref 0.3–1.2)
Total Protein: 5.5 g/dL — ABNORMAL LOW (ref 6.5–8.1)

## 2023-05-23 LAB — CG4 I-STAT (LACTIC ACID)
Lactic Acid, Venous: 1.4 mmol/L (ref 0.5–1.9)
Lactic Acid, Venous: 1.1 mmol/L (ref 0.5–1.9)

## 2023-05-23 LAB — ECHO TEE: Est EF: 30

## 2023-05-23 LAB — GLUCOSE, CAPILLARY
Glucose-Capillary: 105 mg/dL — ABNORMAL HIGH (ref 70–99)
Glucose-Capillary: 109 mg/dL — ABNORMAL HIGH (ref 70–99)
Glucose-Capillary: 111 mg/dL — ABNORMAL HIGH (ref 70–99)
Glucose-Capillary: 117 mg/dL — ABNORMAL HIGH (ref 70–99)
Glucose-Capillary: 123 mg/dL — ABNORMAL HIGH (ref 70–99)
Glucose-Capillary: 91 mg/dL (ref 70–99)

## 2023-05-23 LAB — PROTIME-INR
INR: 1.8 — ABNORMAL HIGH (ref 0.8–1.2)
Prothrombin Time: 21.5 seconds — ABNORMAL HIGH (ref 11.4–15.2)

## 2023-05-23 LAB — PHOSPHORUS
Phosphorus: 2.4 mg/dL — ABNORMAL LOW (ref 2.5–4.6)
Phosphorus: 2 mg/dL — ABNORMAL LOW (ref 2.5–4.6)

## 2023-05-23 LAB — CULTURE, BLOOD (ROUTINE X 2)
Culture: NO GROWTH
Culture: NO GROWTH

## 2023-05-23 LAB — PREPARE RBC (CROSSMATCH)

## 2023-05-23 LAB — APTT
aPTT: 60 s — ABNORMAL HIGH (ref 24–36)
aPTT: 58 s — ABNORMAL HIGH (ref 24–36)

## 2023-05-23 LAB — FIBRINOGEN: Fibrinogen: 637 mg/dL — ABNORMAL HIGH (ref 210–475)

## 2023-05-23 LAB — LACTATE DEHYDROGENASE: LDH: 1431 U/L — ABNORMAL HIGH (ref 98–192)

## 2023-05-23 LAB — MAGNESIUM
Magnesium: 2.3 mg/dL (ref 1.7–2.4)
Magnesium: 2.4 mg/dL (ref 1.7–2.4)

## 2023-05-23 MED ORDER — PROSOURCE TF20 ENFIT COMPATIBL EN LIQD
60.0000 mL | ENTERAL | Status: DC
  Administered 2023-05-23 – 2023-05-29 (×29): 60 mL
  Filled 2023-05-23 (×24): qty 60

## 2023-05-23 MED ORDER — SODIUM CHLORIDE 0.9 % IV SOLN: INTRAVENOUS | Status: DC | PRN

## 2023-05-23 MED ORDER — POTASSIUM CHLORIDE 10 MEQ/50ML IV SOLN
10.0000 meq | INTRAVENOUS | Status: AC
  Administered 2023-05-23 (×4): 10 meq via INTRAVENOUS
  Filled 2023-05-23 (×2): qty 50

## 2023-05-23 MED ORDER — SODIUM CHLORIDE 0.9% IV SOLUTION
Freq: Once | INTRAVENOUS | Status: AC
  Administered 2023-05-23: 50 mL via INTRAVENOUS

## 2023-05-23 MED ORDER — PIVOT 1.5 CAL PO LIQD
1000.0000 mL | ORAL | Status: DC
  Administered 2023-05-23 – 2023-05-24 (×2): 1000 mL

## 2023-05-23 MED ORDER — METOCLOPRAMIDE HCL 5 MG/ML IJ SOLN
10.0000 mg | Freq: Once | INTRAMUSCULAR | Status: AC
  Administered 2023-05-23: 10 mg via INTRAVENOUS
  Filled 2023-05-23: qty 2

## 2023-05-23 MED ORDER — SODIUM CHLORIDE 0.9 % IV SOLN: INTRAVENOUS | Status: DC

## 2023-05-23 MED ORDER — VITAL HIGH PROTEIN PO LIQD: 1000.0000 mL | ORAL | Status: DC

## 2023-05-23 MED ORDER — RENA-VITE PO TABS
1.0000 | ORAL_TABLET | Freq: Two times a day (BID) | ORAL | Status: DC
  Administered 2023-05-23 – 2023-05-29 (×11): 1
  Filled 2023-05-23 (×11): qty 1

## 2023-05-23 MED ORDER — PRISMASOL BGK 4/2.5 32-4-2.5 MEQ/L EC SOLN: Status: DC

## 2023-05-23 MED ORDER — PROSOURCE TF20 ENFIT COMPATIBL EN LIQD: 60.0000 mL | Freq: Every day | ENTERAL | Status: DC

## 2023-05-23 MED ORDER — PRISMASOL BGK 4/2.5 32-4-2.5 MEQ/L REPLACEMENT SOLN: Status: DC

## 2023-05-23 MED FILL — Thrombin (Recombinant) For Soln 20000 Unit: CUTANEOUS | Qty: 1 | Status: AC

## 2023-05-23 NOTE — Procedures (Signed)
Cortrak  Tube Type:  Cortrak - 43 inches Tube Location:  Left nare Secured by: Bridle Technique Used to Measure Tube Placement:  Marking at nare/corner of mouth Cortrak Secured At:  95 cm   Cortrak Tube Team Note:  Consult received to place a Cortrak feeding tube.   X-ray is required, abdominal x-ray has been ordered by the Cortrak team. Please confirm tube placement before using the Cortrak tube.   If the tube becomes dislodged please keep the tube and contact the Cortrak team at [email or web address] for replacement.  If after hours and replacement cannot be delayed, place a NG tube and confirm placement with an abdominal x-ray.    Betsey Holiday MS, RD, LDN Please refer to [facility] for RD and/or RD on-call/weekend/after hours pager

## 2023-05-23 NOTE — Progress Notes (Signed)
Nutrition Follow-up  DOCUMENTATION CODES:   Not applicable  INTERVENTION:   Reverse trendelenburg >10 degrees at all times while TF infusing until able to maintain HOB >30  Tube Feeding via Cortrak: Pivot 1.5 at 60 ml/hr  Begin TF at rate of 20 ml/hr, titrate by 10 mL q 8 hours until goal rate of 60 ml/hr Pro-Source TF20 60 mL daily TF regimen at goal provides 155 g of protein, 2240 kcals and 1094 mL of free water  Pro-Source TF20 60 mL q 4 hours until TF at goal rate, each packet provides 20 grams of protein and 80 kcals.   Renal MVI BID   NUTRITION DIAGNOSIS:   Inadequate oral intake related to inability to eat as evidenced by NPO status.  Being addressed via TF   GOAL:   Patient will meet greater than or equal to 90% of their needs  Progressing  MONITOR:   Vent status, Labs, Weight trends, TF tolerance, Skin, I & O's  REASON FOR ASSESSMENT:   Consult Assessment of nutrition requirement/status  ASSESSMENT:   62 year old male with PMHx of CAD s/p CABG 2017 with AVR, HLD, HTN, prior CVA, T2DM, CKD 4 was previously on HD. Recent admission on 5/25 with AKI and AMS, had PEA arrest with ROSC in 8 minutes, required dialysis, also with MSSA bacteremia associated with tricuspid valve endocarditis, suffered right cerebellar CVA and had cholangitis/pancreatitis requiring ERCP. 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  9/10: admitted 9/11: echo showing aortic valve dehiscence, confirmed by TEE; CRRT started for volume overload and known CKD 4 9/13: to OR for redo of aortic valve, tricuspid valve repair and ascending aortic root replacement with re-implantation of SVG to RCA; could not come off CPB, cannulated and started on VA ECMO with central cannulation  Pt remains on vent support, VA ECMO, open chest,  Remains on CVVHD, anuric Levophed at 2 Vasopressin 0.03 Epinephrine at 5  NG with 750 mL in 24 hours, 170 mL thus far today.  Plan for Cortrak  Current Wt: 84.9 kg Admit Wt: 89.5 kg Lowest Wt: 83.1 kg (exact dry weight not known)  +small BM on 8/12. +constipation, noted orders for scheduled colace, dulcolax, miralax  Labs: phosphorus 2.4 (L-close monitoring due to CRRT) Meds: reviewed   Diet Order:   Diet Order             Diet NPO time specified  Diet effective now                   EDUCATION NEEDS:   No education needs have been identified at this time  Skin:  Skin Assessment: Skin Integrity Issues: Skin Integrity Issues:: Incisions, Other (Comment) Incisions: closed incision left leg Other: open chest  Last BM:  05/19/23 - small type 6  Height:   Ht Readings from Last 1 Encounters:  05/20/23 5\' 6"  (1.676 m)    Weight:   Wt Readings from Last 1 Encounters:  05/23/23 84.9 kg    Ideal Body Weight:  64.5 kg  BMI:  Body mass index is 30.21 kg/m.  Estimated Nutritional Needs:   Kcal:  2200-2400  Protein:  130-150 grams  Fluid:  1L plus UOP  Romelle Starcher MS, RDN, LDN, CNSC Registered Dietitian 3 Clinical Nutrition RD Pager and On-Call Pager Number Located in [place]

## 2023-05-23 NOTE — Progress Notes (Signed)
ANTICOAGULATION CONSULT NOTE - Follow up Consult  Pharmacy Consult for bivalirudin Indication:  VA ECMO  Allergies  Allergen Reactions   Other Anaphylaxis    Mushrooms  Not listed on MAR    Plavix [Clopidogrel Bisulfate] Other (See Comments)    TTP Not listed on the [facility]   Fleet Enema [Enema] Other (See Comments)    Unknown reaction   Lovenox [Enoxaparin] Other (See Comments)    Unknown reaction   Morphine Other (See Comments)    Unknown reaction   Nsaids Other (See Comments)    Unknown reaction   Hydrocodone-Acetaminophen Itching    Patient Measurements: Height: 5\' 6"  (167.6 cm) Weight: 84.9 kg (187 lb 2.7 oz) IBW/kg (Calculated) : 63.8 Heparin Dosing Weight: 81.3 kg  Vital Signs: Temp: 97.9 F (36.6 C) (09/16 0500) Pulse Rate: 80 (09/16 0500)  Labs: Recent Labs    05/21/23 0340 05/21/23 0613 05/21/23 1440 05/21/23 1707 05/22/23 0337 05/22/23 0345 05/22/23 1356 05/22/23 1634 05/22/23 1641 05/22/23 2042 05/23/23 0438 05/23/23 0439  HGB 8.6*   < > 8.3*   < > 7.0*   < >  --  7.8*   < > 7.5* 7.3* 7.8*  HCT 25.8*   < > 24.3*   < > 20.9*   < >  --  22.8*   < > 22.0* 21.8* 23.0*  PLT 81*  --  93*  --  58*  --   --  49*  --   --  43*  --   APTT 52*  --  37*   < > 38*  --  53*  --   --  62* 60*  --   LABPROT 16.8*  --   --   --  18.4*  --   --   --   --   --  21.5*  --   INR 1.3*  --   --   --  1.5*  --   --   --   --   --  1.8*  --   HEPARINUNFRC  --   --  <0.10*  --   --   --   --   --   --   --   --   --   CREATININE 1.91*  1.89*  --   --    < > 1.43*  --   --  1.31*  --   --  1.25*  --    < > = values in this interval not displayed.    Estimated Creatinine Clearance: 61.8 mL/min (A) (by C-G formula based on SCr of 1.25 mg/dL (H)).   Medical History: Past Medical History:  Diagnosis Date   Allergy    Anemia    Anxiety    Baker's cyst of knee    Blood transfusion without reported diagnosis    as baby    Coronary artery disease    quadruple bypass -  March 2016   GERD (gastroesophageal reflux disease)    Gouty arthritis    "real bad" (01/17/2013)   Heart murmur    Hypercholesteremia    Hypertension    MSSA bacteremia 01/29/2023   Myocardial infarction (HCC) 2017   PEA (Pulseless electrical activity) (HCC) 01/29/2023   Stroke (HCC)    Type II diabetes mellitus (HCC)     Medications:  Scheduled:   sodium chloride   Intravenous Once   sodium chloride   Intravenous Once   acetaminophen  1,000 mg Oral Q6H   Or  acetaminophen (TYLENOL) oral liquid 160 mg/5 mL  1,000 mg Per Tube Q6H   acetaminophen (TYLENOL) oral liquid 160 mg/5 mL  650 mg Per Tube Once   aspirin  324 mg Oral Once   aspirin EC  325 mg Oral Daily   Or   aspirin  324 mg Per Tube Daily   atorvastatin  80 mg Per Tube q1800   bisacodyl  10 mg Oral Daily   Or   bisacodyl  10 mg Rectal Daily   Chlorhexidine Gluconate Cloth  6 each Topical Daily   docusate  100 mg Per Tube BID   ezetimibe  10 mg Per Tube Daily   fentaNYL (SUBLIMAZE) injection  50 mcg Intravenous Once   insulin aspart  0-15 Units Subcutaneous Q4H   mupirocin ointment  1 Application Nasal BID   mouth rinse  15 mL Mouth Rinse Q2H   pantoprazole (PROTONIX) IV  40 mg Intravenous QHS   polyethylene glycol  17 g Per Tube Daily   sodium chloride flush  3 mL Intravenous Q12H    Assessment: 62 yom underwent Bentall/AVR, TV repair, re-implantation of SVG-RCA complicated with vasoplegia and pulmonary edema requiring central VA ECMO. No AC PTA.  Hgb 7.3 (plan for 1 unit PRBC), plt down to 43. INR 1.8. Fibrinogen up to 637, LDH slightly up to 1431. Slight oozy but stable. Circuit with slight fibrin formation - now improved. Stable on CRRT currently. aPTT therapeutic at 60 on bivalirudin 0.02mg /kg/hr.  Goal of Therapy:  aPTT 50-70 seconds Monitor platelets by anticoagulation protocol: Yes   Plan:  Continue bivalirudin 0.02 mg/kg/hr  Monitor q12 hr aPTT and CBC, and for s/sx of bleeding   Thank you for  allowing pharmacy to participate in this patient's care,  Sherron Monday, PharmD, BCCCP Clinical Pharmacist  Phone: [phone] 05/23/2023 7:25 AM  Please check AMION for all [facility] Pharmacy phone numbers After 10:00 PM, call Main Pharmacy [phone]

## 2023-05-23 NOTE — Progress Notes (Signed)
PT Cancellation Note  Patient Details Name: [name] MRN: [id number] DOB: 02-26-1961   Cancelled Treatment:    Reason Eval/Treat Not Completed: Patient not medically ready. Pt now on ECMO and vent with open chest from surgery. Plan is to return to OR tomorrow. PT to return later in week as able, as appropriate to perform PT re-eval.  Lewis Shock, PT, DPT Acute Rehabilitation Services Secure chat preferred Office #: [phone]    Iona Hansen 05/23/2023, 8:22 AM

## 2023-05-23 NOTE — TOC Initial Note (Signed)
Transition of Care [facility]) - Initial/Assessment Note    Patient Details  Name: [name] MRN: [id number] Date of Birth: 1961/09/06  [facility] of [facility]) CM/SW Contact:    Elliot Cousin, RN Phone Number: [phone] 05/23/2023, 2:47 PM  Clinical Narrative:   CM contacted pt's dtr, Danford Bad. States pt lives in home with his SO. Provided her with CM contact number. Will continue to follow as patient progresses for dc needs.                 Expected Discharge Plan: Home w Home Health Services Barriers to Discharge: Continued Medical Work up   Patient Goals and CMS Choice            Expected Discharge Plan and Services   Discharge Planning Services: CM Consult Post Acute Care Choice: Home Health Living arrangements for the past 2 months: Single Family Home                             HH Agency: [facility]        Prior Living Arrangements/Services Living arrangements for the past 2 months: Single Family Home Lives with:: Significant Other                   Activities of Daily Living Home Assistive Devices/Equipment: Cane (specify quad or straight) ADL Screening (condition at time of admission) Patient's cognitive ability adequate to safely complete daily activities?: Yes Is the patient deaf or have difficulty hearing?: No Does the patient have difficulty seeing, even when wearing glasses/contacts?: No Does the patient have difficulty concentrating, remembering, or making decisions?: No Patient able to express need for assistance with ADLs?: Yes Does the patient have difficulty dressing or bathing?: No Independently performs ADLs?: Yes (appropriate for developmental age) Does the patient have difficulty walking or climbing stairs?: No Weakness of Legs: Both Weakness of Arms/Hands: None  Permission Sought/Granted      Share Information with NAME: Burns Spain     Permission granted to share info w Relationship: daughter  Permission  granted to share info w Contact Information: [phone]  Emotional Assessment   Attitude/Demeanor/Rapport: Intubated (Following Commands or Not Following Commands)       Psych Involvement: No (comment)  Admission diagnosis:  Volume overload [E87.70] Anemia, unspecified type [D64.9] Acute on chronic congestive heart failure, unspecified heart failure type (HCC) [I50.9] Acute on chronic combined systolic and diastolic CHF (congestive heart failure) (HCC) [I50.43] Patient Active Problem List   Diagnosis Date Noted   Postoperative shock, cardiogenic (HCC) 06/04/2023   Acute on chronic combined systolic and diastolic CHF (congestive heart failure) (HCC) 05/18/2023   Prosthetic aortic valve failure 05/18/2023   Acute respiratory failure with hypoxia (HCC) 05/18/2023   Anemia 05/18/2023   Volume overload 05/18/2023   Acute on chronic congestive heart failure (HCC) 05/30/2023   ESRD (end stage renal disease) on dialysis (HCC) 04/11/2023   Endocarditis of tricuspid valve 03/30/2023   Medication management 03/30/2023   Symptomatic anemia 02/26/2023   Allergy, unspecified, initial encounter 02/25/2023   Anaphylactic shock, unspecified, initial encounter 02/25/2023   Dependence on renal dialysis (HCC) 02/15/2023   Chronic kidney disease, stage 3 unspecified (HCC) 02/15/2023   Hypertensive chronic kidney disease with stage 1 through stage 4 chronic kidney disease, or unspecified chronic kidney disease 02/15/2023   Personal history of transient ischemic attack (TIA), and cerebral infarction without residual deficits 02/15/2023   Sepsis (HCC) 01/29/2023  TTP (thrombotic thrombocytopenic purpura) (HCC) 01/29/2023   MSSA bacteremia 01/29/2023   PEA (Pulseless electrical activity) (HCC) 01/29/2023   Severe sepsis with septic shock (HCC) 01/29/2023   Prosthetic valve endocarditis (HCC) 01/29/2023   Influenza vaccine refused 10/09/2020   OSA on CPAP 02/06/2019   Macroalbuminuric diabetic  nephropathy (HCC) 02/06/2019   TIA (transient ischemic attack) 04/10/2018   Colon cancer screening 05/04/2017   Cerebral infarction (HCC) 12/11/2015   Acute ischemic VBA thalamic stroke (HCC)    Atherosclerosis of native coronary artery of native heart without angina pectoris    H/O heart bypass surgery    Benign essential HTN    Type 2 diabetes mellitus without complication, without long-term current use of insulin (HCC)    S/P AVR 11/25/2015   CAD (coronary artery disease)    Chronic periodontitis 11/21/2015   Bicuspid aortic valve    Controlled type 2 diabetes mellitus with diabetic nephropathy, without long-term current use of insulin (HCC)    Essential hypertension 02/24/2015   Dyslipidemia 02/24/2015   GERD (gastroesophageal reflux disease) 05/02/2013   Primary gout 05/02/2013   Allergic rhinitis due to allergen 07/09/2010   Class 1 obesity 07/08/2008   PCP:  Marcine Matar, MD Pharmacy:   [facility] [address] [place], [place] - [address] [place] [address] Phone: [phone] Fax: [phone]     Social Determinants of Health (SDOH) Social History: SDOH Screenings   Food Insecurity: No Food Insecurity (06/14/23)  Housing: Low Risk  (2023/06/14)  Transportation Needs: No Transportation Needs (14-Jun-2023)  Utilities: Not At Risk (06-14-2023)  Alcohol Screen: Low Risk  (11/11/2022)  Depression (PHQ2-9): Low Risk  (03/30/2023)  Financial Resource Strain: Low Risk  (12/09/2022)  Physical Activity: Inactive (12/09/2022)  Social Connections: Socially Isolated (11/11/2022)  Stress: No Stress Concern Present (12/09/2022)  Tobacco Use: Low Risk  (05/12/2023)   SDOH Interventions:     Readmission Risk Interventions     No data to display

## 2023-05-23 NOTE — Progress Notes (Signed)
  Echocardiogram Echocardiogram Transesophageal has been performed.  Milda Smart 05/23/2023, 8:33 AM

## 2023-05-23 NOTE — Progress Notes (Signed)
      [address]       [place] [address]             [phone]      Intubated, sedated  BP 101/60   Pulse 66   Temp 97.9 F (36.6 C)   Resp 15   Ht 5\' 6"  (1.676 m)   Wt 84.9 kg   SpO2 99%   BMI 30.21 kg/m  41/23 CO 4.4 CI 2.2 On epi 5, norepi 2. Vasopressin 0.03 PRVC 15/50%/5 PEEP  K= 4.1 Hct =25, WBC 54K unchanged PLT stable at 40 K with no evidence of bleeding  On ECMO and CRRT, tolerating volume removal  Viviann Spare C. Dorris Fetch, MD [facility] [phone]

## 2023-05-23 NOTE — Progress Notes (Addendum)
Patient ID: [name], male   DOB: Jun 24, 1961, 62 y.o.   MRN: [id number]     Advanced Heart Failure Rounding Note  PCP-Cardiologist: Kristeen Miss, MD   Subjective:    9/13: OR for Bentall/AVR, TV repair, re-implantation of SVG-RCA.  Post-op vasoplegia and pulmonary edema, required central [facility] ECMO (RA drainage, ascending aorta return.  Post-op TEE EF 55%, mild-moderate RV hypokinesis.  9/14: Blender added to circuit due to high PaO2  MAP around 70s-80s on epinephrine 5, NE 2,  and vasopressin 0.03.  CVVH 50-100 cc/hr net negative UF, net negative 2074.     H/o MSSA endocarditis, currently on meropenem/vancomycin. WBCs 54K.   Hgb 7.3, plts 43  Swan: CVP 10 PA 34/22  CXR with diffuse bilateral pulmonary edema.   Appears to be in atrial fibrillation rate controlled.   VA ECMO 2800 rpm Flow 3.3 L/min pVen -15 Delta P 19 Sweep 4.5 LDH 775 => 1020 => 1431 Lactate 1.3 => 1.5 => 1.4 ABG 7.42/39/168   Objective:   Weight Range: 84.9 kg Body mass index is 30.21 kg/m.   Vital Signs:   Temp:  [97.7 F (36.5 C)-98.1 F (36.7 C)] 97.9 F (36.6 C) (09/16 0500) Pulse Rate:  [47-80] 80 (09/16 0500) Resp:  [0-15] 15 (09/16 0500) SpO2:  [98 %-100 %] 99 % (09/16 0500) Arterial Line BP: (79-93)/(65-78) 87/75 (09/16 0500) FiO2 (%):  [50 %] 50 % (09/16 0344) Weight:  [84.9 kg] 84.9 kg (09/16 0500) Last BM Date : 05/18/23  Weight change: Filed Weights   05/21/23 0615 05/22/23 0615 05/23/23 0500  Weight: 83.1 kg 87.6 kg 84.9 kg    Intake/Output:   Intake/Output Summary (Last 24 hours) at 05/23/2023 0736 Last data filed at 05/23/2023 0700 Gross per 24 hour  Intake 3156.12 ml  Output 5230.7 ml  Net -2074.58 ml      Physical Exam    General: Intubated/sedated Neck: JVP difficult, no thyromegaly or thyroid nodule.  Lungs: Decreased BS CV: Nondisplaced PMI.  Heart irregular S1/S2, no S3/S4, no murmur.  Trace ankle edema.  Abdomen: Soft, nontender, no  hepatosplenomegaly, no distention.  Skin: Intact without lesions or rashes.  Neurologic: Alert and oriented x 3.  Psych: Normal affect. Extremities: No clubbing or cyanosis.  HEENT: Normal.   Telemetry   ?atrial fibrillation rate 80s (personally reviewed)   Labs    CBC Recent Labs    05/22/23 1634 05/22/23 1641 05/23/23 0438 05/23/23 0439  WBC 50.7*  --  54.5*  --   HGB 7.8*   < > 7.3* 7.8*  HCT 22.8*   < > 21.8* 23.0*  MCV 86.4  --  87.9  --   PLT 49*  --  43*  --    < > = values in this interval not displayed.   Basic Metabolic Panel Recent Labs    28/41/32 2118 05/21/23 0014 05/21/23 0340 05/21/23 0613 05/22/23 1634 05/22/23 1641 05/23/23 0438 05/23/23 0439  NA 144   < > 140  140   < > 135   < > 135 137  K 5.5*   < > 5.3*  5.3*   < > 3.6   < > 3.4* 3.6  CL 110  --  106  107   < > 102  --  102  --   CO2 21*  --  22  21*   < > 23  --  23  --   GLUCOSE 112*  --  124*  123*   < >  126*  --  114*  --   BUN 32*  --  29*  30*   < > 15  --  13  --   CREATININE 1.99*  --  1.91*  1.89*   < > 1.31*  --  1.25*  --   CALCIUM 8.1*  --  7.8*  7.8*   < > 7.9*  --  7.9*  --   MG 2.4  --  2.3  --   --   --   --   --   PHOS 6.1*  --  5.4*   < > 2.8  --  2.3*  --    < > = values in this interval not displayed.   Liver Function Tests Recent Labs    05/22/23 0337 05/22/23 1634 05/23/23 0438  AST 83*  --  82*  ALT 12  --  12  ALKPHOS 39  --  73  BILITOT 1.3*  --  1.2  PROT 5.3*  --  5.5*  ALBUMIN 2.9*  2.9* 2.7* 2.4*  2.4*   No results for input(s): "LIPASE", "AMYLASE" in the last 72 hours. Cardiac Enzymes No results for input(s): "CKTOTAL", "CKMB", "CKMBINDEX", "TROPONINI" in the last 72 hours.  BNP: BNP (last 3 results) Recent Labs    06/04/2023 1100  BNP 2,097.0*    ProBNP (last 3 results) No results for input(s): "PROBNP" in the last 8760 hours.   D-Dimer No results for input(s): "DDIMER" in the last 72 hours. Hemoglobin A1C No results for  input(s): "HGBA1C" in the last 72 hours.  Fasting Lipid Panel No results for input(s): "CHOL", "HDL", "LDLCALC", "TRIG", "CHOLHDL", "LDLDIRECT" in the last 72 hours. Thyroid Function Tests No results for input(s): "TSH", "T4TOTAL", "T3FREE", "THYROIDAB" in the last 72 hours.  Invalid input(s): "FREET3"  Other results:   Imaging    DG CHEST PORT 1 VIEW  Result Date: 05/22/2023 CLINICAL DATA:  Ventilator dependence. EXAM: PORTABLE CHEST 1 VIEW COMPARISON:  Earlier same day FINDINGS: Endotracheal tube tip is 2.3 cm above the base of the carina. The NG tube passes into the stomach although the distal tip position is not included on the film. Right IJ central line tip overlies the mid SVC level. Bilateral chest tubes noted without evidence for pneumothorax. Left IJ presumed pulmonary artery catheter is looped in the right atrium with tip position in the region of the tricuspid valve. Presumed ECMO cannula again noted. Diffuse bilateral airspace disease, right greater than left, compatible with edema. IMPRESSION: 1. Left IJ presumed pulmonary artery catheter is looped in the right atrium with tip position in the region of the tricuspid valve. 2. Otherwise stable exam. No pneumothorax. Electronically Signed   By: Kennith Center M.D.   On: 05/22/2023 08:14     Medications:     Scheduled Medications:  sodium chloride   Intravenous Once   sodium chloride   Intravenous Once   acetaminophen  1,000 mg Oral Q6H   Or   acetaminophen (TYLENOL) oral liquid 160 mg/5 mL  1,000 mg Per Tube Q6H   acetaminophen (TYLENOL) oral liquid 160 mg/5 mL  650 mg Per Tube Once   aspirin  324 mg Oral Once   aspirin EC  325 mg Oral Daily   Or   aspirin  324 mg Per Tube Daily   atorvastatin  80 mg Per Tube q1800   bisacodyl  10 mg Oral Daily   Or   bisacodyl  10 mg Rectal Daily   Chlorhexidine  Gluconate Cloth  6 each Topical Daily   docusate  100 mg Per Tube BID   ezetimibe  10 mg Per Tube Daily   fentaNYL  (SUBLIMAZE) injection  50 mcg Intravenous Once   insulin aspart  0-15 Units Subcutaneous Q4H   mupirocin ointment  1 Application Nasal BID   mouth rinse  15 mL Mouth Rinse Q2H   pantoprazole (PROTONIX) IV  40 mg Intravenous QHS   polyethylene glycol  17 g Per Tube Daily   sodium chloride flush  3 mL Intravenous Q12H    Infusions:   prismasol BGK 4/2.5 400 mL/hr at 05/23/23 0038    prismasol BGK 4/2.5 400 mL/hr at 05/23/23 0038   sodium chloride Stopped (05/23/23 0533)   sodium chloride     sodium chloride Stopped (05/22/23 1310)   sodium chloride     anticoagulant sodium citrate     bivalirudin (ANGIOMAX) 250 mg in sodium chloride 0.9 % 500 mL (0.5 mg/mL) infusion 0.02 mg/kg/hr (05/23/23 0600)   dexmedetomidine (PRECEDEX) IV infusion 0.5 mcg/kg/hr (05/23/23 0600)   epinephrine 5 mcg/min (05/23/23 0600)   fentaNYL infusion INTRAVENOUS 200 mcg/hr (05/23/23 0600)   insulin Stopped (05/21/23 0754)   lactated ringers     lactated ringers 20 mL/hr at 05/23/23 0600   meropenem (MERREM) IV 200 mL/hr at 05/23/23 0600   midazolam 10 mg/hr (05/23/23 0600)   milrinone     norepinephrine (LEVOPHED) Adult infusion 2 mcg/min (05/23/23 0600)   potassium chloride 10 mEq (05/23/23 0736)   prismasol BGK 4/2.5 1,500 mL/hr at 05/23/23 0735   vancomycin Stopped (05/22/23 2209)   vasopressin 0.03 Units/min (05/23/23 0600)    PRN Medications: sodium chloride, anticoagulant sodium citrate, artificial tears, dextrose, fentaNYL, metoprolol tartrate, midazolam, midazolam, morphine injection, ondansetron (ZOFRAN) IV, mouth rinse, oxyCODONE, sodium chloride flush, traMADol    Assessment/Plan   1. Post-cardiotomy vasoplegia/shock with failure to wean from CPB: Now on central [facility] ECMO (Jun 14, 2023) with RA drainage/ascending aorta return. Post-op echo with EF 55%, mild-moderate RV dysfunction. Unable to get images yesterday by TTE.  MAP around 70 now on epinephrine 5 + NE 2 + vasopressin 0.03, pulsatile arterial  line.  CXR still with diffuse pulmonary edema.  I/Os finally net negative via CVVH, -2L net.   - Need to pull via CVVH today to clear lungs, aim for 50-100 cc/hr net negative UF.  We decreased ECMO flow to 3.3 to see if we can pull CVVH without chugging, can lower down to 3 L/min today as long as MAP tolerates.   - Continue current pressor support to allow CVVH.   - Continue bivalirudin today with goal PTT 50-70.  - TEE today for better cardiac visualization.  - Decannulation once lungs more clear. May need to transition to [facility].  2. Acute hypoxic respiratory failure, post-op: Diffuse pulmonary edema.  On vent, rest settings.  CXR with diffuse pulmonary edema. Blender added to keep PaO2 down, ABG stable today.  - As above, lowering ECMO flow to 3 L/min, will try to continue to pull net negative via CVVH.  3. Severe prosthetic AI: Due to previous MSSA endocarditis with partial valve dehiscence s/p re-do AVR/Bentall with reimplantation of SVG-RCA and TV repair June 14, 2023.   - Continuing meropenem/vancomycin.  4. CAD s/p CABG/AVR 2017:  s/p AVR/Bentall and re-implantation of SVG-RCA Jun 14, 2023. - Stop ASA for now with platelets down.  5. ESRD: CVVH as above.  6. DM2: management per CCM 7. Heart block: Post-op, now has underlying rhythm (?atrial fibrillation).   -  Will keep pacing to backup for now.  - ECG.   8. H/o CVA: Prior cerebellar CVA.  9. Thrombocytopenia: Post-op, 43 K today.   10. Anemia: Post-op, transfuse hgb < 8.  - Give 1 unit PRBCs today.  11. ID: H/o MSSA bacteremia.  WBCs up to 54K now.  - Meropenem + vancomycin, antibiotics broadened yesterday.   CRITICAL CARE Performed by: [name]  Total critical care time: 45 minutes  Critical care time was exclusive of separately billable procedures and treating other patients.  Critical care was necessary to treat or prevent imminent or life-threatening deterioration.  Critical care was time spent personally by me on the following  activities: development of treatment plan with patient and/or surrogate as well as nursing, discussions with consultants, evaluation of patient's response to treatment, examination of patient, obtaining history from patient or surrogate, ordering and performing treatments and interventions, ordering and review of laboratory studies, ordering and review of radiographic studies, pulse oximetry and re-evaluation of patient's condition.    Length of Stay: 5  [name], MD  05/23/2023, 7:36 AM  Advanced Heart Failure Team Pager [phone] (M-F; 7a - 5p)  Please contact [facility] for night-coverage after hours (5p -7a ) and weekends on [email or web address]

## 2023-05-23 NOTE — Progress Notes (Signed)
Patient ID: [name], male   DOB: 1960/09/16, 62 y.o.   MRN: [id number]  Extracorporeal support note   ECLS support day: Indication: Post-op vasoplegia and pulmonary edema  Configuration: [facility] ECMO  Drainage cannula: RA Return cannula: Ascending aorta  Pump speed: 2800 rpm  Pump flow: 3.3 L/min Pump used: Cardiohelp  Sweep gas: 4.5 with blender to lower PaO2  Circuit check: No fibrin Anticoagulant: Bivalirudin Anticoagulation targets: PTT 50-70  Changes in support: none  Anticipated goals/duration of support: Wean to decannulation  .[name]  05/23/2023, 7:35 AM

## 2023-05-23 NOTE — Progress Notes (Signed)
3 Days Post-Op Procedure(s) (LRB): REDO AORTIC VALVE REPLACEMENT (AVR) USING A 25 MM CRYO AORTIC VALVE CONDUIT (N/A) ASCENDING AORTIC ROOT REPLACEMENT (N/A) TRICUSPID VALVE REPAIR USING A 34 MM MC3 ANNULOPLASTY RING (N/A) TRANSESOPHAGEAL ECHOCARDIOGRAM (N/A) Subjective:  Has been hemodynamically stable overnight. Remains on epi 5, NE 2, vaso 0.03.  Rhythm is regular in 70's. ECG looks RBBB but not sure it is sinus. Could be accelerated junction.  ECMO flow 3.27 -2075 cc for 24 hrs.  Removing 50-100 cc per hr with CRRT.  Objective: Vital signs in last 24 hours: Temp:  [97.7 F (36.5 C)-98.1 F (36.7 C)] 97.9 F (36.6 C) (09/16 0500) Pulse Rate:  [47-80] 80 (09/16 0500) Cardiac Rhythm: Junctional rhythm (09/16 0046) Resp:  [0-15] 15 (09/16 0500) SpO2:  [98 %-100 %] 99 % (09/16 0500) Arterial Line BP: (79-93)/(65-78) 87/75 (09/16 0500) FiO2 (%):  [50 %] 50 % (09/16 0344) Weight:  [84.9 kg] 84.9 kg (09/16 0500)  Hemodynamic parameters for last 24 hours: PAP: (29-38)/(19-25) 35/23 CVP:  [7 mmHg-12 mmHg] 11 mmHg  Intake/Output from previous day: 09/15 0701 - 09/16 0700 In: 3156.1 [I.V.:2541.6; NG/GT:125; IV Piggyback:489.5] Out: 5230.7 [Urine:20; Emesis/NG output:750; Chest Tube:40] Intake/Output this shift: No intake/output data recorded.  General appearance: intubated and sedated on vent Neurologic: unable to assess Heart: regular rate and rhythm, S1, S2 normal, no murmur Lungs: coarse bilaterally Abdomen: soft, no bowel sounds  Extremities: edema moderate. Wound: chest dressing intact.   Lab Results: Recent Labs    05/22/23 1634 05/22/23 1641 05/23/23 0438 05/23/23 0439  WBC 50.7*  --  54.5*  --   HGB 7.8*   < > 7.3* 7.8*  HCT 22.8*   < > 21.8* 23.0*  PLT 49*  --  43*  --    < > = values in this interval not displayed.   BMET:  Recent Labs    05/22/23 1634 05/22/23 1641 05/23/23 0438 05/23/23 0439  NA 135   < > 135 137  K 3.6   < > 3.4* 3.6  CL 102   --  102  --   CO2 23  --  23  --   GLUCOSE 126*  --  114*  --   BUN 15  --  13  --   CREATININE 1.31*  --  1.25*  --   CALCIUM 7.9*  --  7.9*  --    < > = values in this interval not displayed.    PT/INR:  Recent Labs    05/23/23 0438  LABPROT 21.5*  INR 1.8*   ABG    Component Value Date/Time   PHART 7.420 05/23/2023 0439   HCO3 25.5 05/23/2023 0439   TCO2 27 05/23/2023 0439   ACIDBASEDEF 1.0 05/22/2023 0345   O2SAT 100 05/23/2023 0439   CBG (last 3)  Recent Labs    05/22/23 2041 05/23/23 0035 05/23/23 0437  GLUCAP 133* 123* 117*   CXR: unchanged bilateral air space disease c/w edema, ARDS  Assessment/Plan: S/P Procedure(s) (LRB): REDO AORTIC VALVE REPLACEMENT (AVR) USING A 25 MM CRYO AORTIC VALVE CONDUIT (N/A) ASCENDING AORTIC ROOT REPLACEMENT (N/A) TRICUSPID VALVE REPAIR USING A 34 MM MC3 ANNULOPLASTY RING (N/A) TRANSESOPHAGEAL ECHOCARDIOGRAM (N/A)  POD 3  Hemodynamics stable on current low dose vasopressor support. ECMO flow turned down this weekend to allow pulsatile arterial flow and volume removal. Plan TEE today to assess cardiac function.  Continue volume removal with CRRT.  Leukocytosis to 54K which is likely inflammatory at that level.  Antibiotic coverage broadened to vanc and Merrem. Preop blood cultures and OR cultures negative so far.  Thrombocytopenia: likely related to heparin during surgery, ECMO. Will continue to follow since no bleeding. I doubt HIT given time course early postop.   Continue bival anticoagulation for ECMO.  Start tube feeds.  LOS: 5 days    [name] 05/23/2023

## 2023-05-23 NOTE — Plan of Care (Signed)
  Problem: Respiratory: Goal: Ability to maintain a clear airway and adequate ventilation will improve Outcome: Progressing   Problem: Cardiac: Goal: Ability to achieve and maintain adequate cardiopulmonary perfusion will improve Outcome: Progressing   Problem: Fluid Volume: Goal: Ability to maintain a balanced intake and output will improve Outcome: Progressing   Problem: Fluid Volume: Goal: Ability to maintain a balanced intake and output will improve Outcome: Progressing

## 2023-05-23 NOTE — Progress Notes (Addendum)
[facility] NEPHROLOGY PROGRESS NOTE  Assessment/ Plan: 58M recently recovered GFR from dialysis dependent AKI with dehiscence  of prosthetic aortic value and severe AI/TR.   # Anuric AKI/dialysis dependent AKI with fluid overload: Started on RRT during previous admission in May 2024 until 05/10/23 (recovered). Patient is status post cardiac surgery on 9/13 and currently on ECMO. CRRT start 9/11. He remains anuric with fluid overload.  Continue to run CRRT at current prescription.  UF as tolerated by BP and ECMO, Angiomax for anticoagulation.  Discussed with ICU RN at bedside.  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  Per CTS  # Postcardiotomy vasoplegia/shock with failure to wean: Currently on ECMO per CHF team.  On pressors per primary service.  Volume managing with CRRT.  # Hyperkalemia: Managed with CRRT prescription and follow labs. K stable today  # Anemia: Transfuse as needed per primary team.  Subjective: Seen and examined on CRRT.  Currently on ECMO, CRRT and multiple lines.  Neg neg ~1.9L Discussed with RN, plan for TEE today. Uop: 20cc Pressors: epi, NE, vaso  Objective Vital signs in last 24 hours: Vitals:   05/23/23 0715 05/23/23 0730 05/23/23 0745 05/23/23 0800  BP:      Pulse: 75 72 68 71  Resp: (!) 5 (!) 0 13 15  Temp: 97.9 F (36.6 C) 97.7 F (36.5 C) 97.7 F (36.5 C) 97.7 F (36.5 C)  TempSrc:      SpO2: 99% 99% 99% 99%  Weight:      Height:       Weight change: -2.7 kg  Intake/Output Summary (Last 24 hours) at 05/23/2023 0823 Last data filed at 05/23/2023 0800 Gross per 24 hour  Intake 3305.02 ml  Output 5319.8 ml  Net -2014.78 ml       Labs: RENAL PANEL Recent Labs  Lab 05/19/23 0636 05/19/23 1132 05/20/23 0310 05/20/23 0754 05/20/23 2118 05/21/23 0014 05/21/23 0340 05/21/23 2778 05/21/23 1707 05/21/23 1715 05/22/23 0337  05/22/23 0345 05/22/23 1634 05/22/23 1641 05/22/23 2042 05/23/23 0438 05/23/23 0439  NA 138   < > 134*   < > 144   < > 140  140   < > 139   < > 137   < > 135 137 136 135 137  K 5.0   < > 4.2   < > 5.5*   < > 5.3*  5.3*   < > 4.7   < > 3.9   < > 3.6 3.8 3.5 3.4* 3.6  CL 104   < > 104   < > 110  --  106  107  --  105  --  103  --  102  --   --  102  --   CO2 18*   < > 22  --  21*  --  22  21*  --  24  --  23  --  23  --   --  23  --   GLUCOSE 101*   < > 97   < > 112*  --  124*  123*  --  147*  --  135*  --  126*  --   --  114*  --   BUN 49*   < > 32*   < > 32*  --  29*  30*  --  22  --  18  --  15  --   --  13  --   CREATININE 2.10*   < > 1.80*   < > 1.99*  --  1.91*  1.89*  --  1.56*  --  1.43*  --  1.31*  --   --  1.25*  --   CALCIUM 8.6*   < > 8.3*  --  8.1*  --  7.8*  7.8*  --  7.8*  --  7.8*  --  7.9*  --   --  7.9*  --   MG 2.3  --  2.3  --  2.4  --  2.3  --   --   --   --   --   --   --   --   --   --   PHOS 4.0   < > 2.7  --  6.1*  --  5.4*  --  4.6  --  3.9  --  2.8  --   --  2.3*  --   ALBUMIN 3.3*   < > 3.0*  --  <1.5*  --  2.6*  --  2.9*  --  2.9*  2.9*  --  2.7*  --   --  2.4*  2.4*  --    < > = values in this interval not displayed.    Liver Function Tests: Recent Labs  Lab 05/18/23 0411 05/19/23 0636 05/22/23 0337 05/22/23 1634 05/23/23 0438  AST 13*  --  83*  --  82*  ALT 8  --  12  --  12  ALKPHOS 97  --  39  --  73  BILITOT 2.0*  --  1.3*  --  1.2  PROT 8.5*  --  5.3*  --  5.5*  ALBUMIN 3.5   < > 2.9*  2.9* 2.7* 2.4*  2.4*   < > = values in this interval not displayed.   No results for input(s): "LIPASE", "AMYLASE" in the last 168 hours. No results for input(s): "AMMONIA" in the last 168 hours. CBC: Recent Labs    08/12/22 0934 01/28/23 2355 05/17/23 2116 05/18/23 0411 05/19/23 0636 05/20/23 0309 05/22/23 1634 05/22/23 1641 05/22/23 2042 05/23/23 0438 05/23/23 0439  HGB 10.2*   < > 7.6*   < > 8.1*   < > 7.8* 8.2* 7.5* 7.3* 7.8*  MCV 94    < >  --    < > 89.3   < > 86.4  --   --  87.9  --   VITAMINB12  --   --   --   --  701  --   --   --   --   --   --   FERRITIN 537*  --  2,200*  --   --   --   --   --   --   --   --   TIBC 264  --  204*  --   --   --   --   --   --   --   --   IRON 83  --  140  --   --   --   --   --   --   --   --    < > = values in this interval not displayed.    Cardiac Enzymes: No results for input(s): "CKTOTAL", "CKMB", "CKMBINDEX", "TROPONINI" in the last 168 hours. CBG: Recent Labs  Lab 05/22/23 1205 05/22/23 1638 05/22/23 2041 05/23/23 0035 05/23/23 0437  GLUCAP 132* 122* 133*  123* 117*    Iron Studies: No results for input(s): "IRON", "TIBC", "TRANSFERRIN", "FERRITIN" in the last 72 hours. Studies/Results: DG CHEST PORT 1 VIEW  Result Date: 05/22/2023 CLINICAL DATA:  ECMO EXAM: PORTABLE CHEST - 1 VIEW COMPARISON:  the previous day's study FINDINGS: Endotracheal tube tip less than 2 cm above carina. Gastric tube to the decompressed stomach. Stable right IJ hemodialysis catheter, bilateral chest tubes, right atrial and Left subclavian cannulae. The left IJ Swan-Ganz is retracted to the right atrium. Diffuse of the earlier opacities throughout the lungs as before. No pneumothorax or significant effusion. Heart size and mediastinal contours are within normal limits. Visualized bones unremarkable. IMPRESSION: 1. Endotracheal tube tip less than 2 cm above carina. 2. Retraction of Swan-Ganz catheter to the right atrium. 3. Diffuse bilateral pulmonary opacities as before. Electronically Signed   By: Corlis Leak M.D.   On: 05/22/2023 08:51   DG CHEST PORT 1 VIEW  Result Date: 05/22/2023 CLINICAL DATA:  Ventilator dependence. EXAM: PORTABLE CHEST 1 VIEW COMPARISON:  Earlier same day FINDINGS: Endotracheal tube tip is 2.3 cm above the base of the carina. The NG tube passes into the stomach although the distal tip position is not included on the film. Right IJ central line tip overlies the mid SVC level.  Bilateral chest tubes noted without evidence for pneumothorax. Left IJ presumed pulmonary artery catheter is looped in the right atrium with tip position in the region of the tricuspid valve. Presumed ECMO cannula again noted. Diffuse bilateral airspace disease, right greater than left, compatible with edema. IMPRESSION: 1. Left IJ presumed pulmonary artery catheter is looped in the right atrium with tip position in the region of the tricuspid valve. 2. Otherwise stable exam. No pneumothorax. Electronically Signed   By: Kennith Center M.D.   On: 05/22/2023 08:14    Medications: Infusions:   prismasol BGK 4/2.5 400 mL/hr at 05/23/23 0038    prismasol BGK 4/2.5 400 mL/hr at 05/23/23 0038   sodium chloride Stopped (05/23/23 0736)   sodium chloride     sodium chloride Stopped (05/22/23 1310)   sodium chloride     anticoagulant sodium citrate     bivalirudin (ANGIOMAX) 250 mg in sodium chloride 0.9 % 500 mL (0.5 mg/mL) infusion 0.02 mg/kg/hr (05/23/23 0800)   dexmedetomidine (PRECEDEX) IV infusion 0.5 mcg/kg/hr (05/23/23 0800)   epinephrine 5 mcg/min (05/23/23 0800)   fentaNYL infusion INTRAVENOUS 200 mcg/hr (05/23/23 0800)   insulin Stopped (05/21/23 0754)   lactated ringers     lactated ringers 20 mL/hr at 05/23/23 0800   meropenem (MERREM) IV Stopped (05/23/23 0603)   midazolam 10 mg/hr (05/23/23 0800)   norepinephrine (LEVOPHED) Adult infusion 2 mcg/min (05/23/23 0800)   potassium chloride 50 mL/hr at 05/23/23 0800   prismasol BGK 4/2.5 1,500 mL/hr at 05/23/23 0735   vancomycin Stopped (05/22/23 2209)   vasopressin 0.03 Units/min (05/23/23 0800)    Scheduled Medications:  sodium chloride   Intravenous Once   sodium chloride   Intravenous Once   acetaminophen  1,000 mg Oral Q6H   Or   acetaminophen (TYLENOL) oral liquid 160 mg/5 mL  1,000 mg Per Tube Q6H   acetaminophen (TYLENOL) oral liquid 160 mg/5 mL  650 mg Per Tube Once   atorvastatin  80 mg Per Tube q1800   bisacodyl  10 mg Oral  Daily   Or   bisacodyl  10 mg Rectal Daily   Chlorhexidine Gluconate Cloth  6 each Topical Daily   docusate  100  mg Per Tube BID   ezetimibe  10 mg Per Tube Daily   fentaNYL (SUBLIMAZE) injection  50 mcg Intravenous Once   insulin aspart  0-15 Units Subcutaneous Q4H   mupirocin ointment  1 Application Nasal BID   mouth rinse  15 mL Mouth Rinse Q2H   pantoprazole (PROTONIX) IV  40 mg Intravenous QHS   polyethylene glycol  17 g Per Tube Daily   sodium chloride flush  3 mL Intravenous Q12H    have reviewed scheduled and prn medications.  Physical Exam: General: Critically ill looking male, intubated, sedated, multiple lines Heart: S1S2 Lungs: Multiple lines and ECMO in the chest, coarse breath sound bilaterally, intubated Abdomen:soft,  non-distended Extremities: trace to 1+ pitting edema b/l Les Neuro: sedated Dialysis Access: [facility] HD line   Cherylann Hobday 05/23/2023,8:23 AM  LOS: 5 days

## 2023-05-23 NOTE — CV Procedure (Signed)
Procedure: TEE  Sedation: Versed/Fentanyl  Indication: LV function, unable to see heart via TTE  Findings: Please see echo section for full report.  Normal LV size with moderate LV hypertrophy.  EF 30%, diffuse hypokinesis.  Normal RV size with moderate hypokinesis.  Normal left atrial size, no LA appendage thrombus.  Normal right atrial size.  No PFO/ASD by color doppler.  S/p Bentall and bioprosthetic aortic valve replacement.  Trivial AI, no stenosis of bioprosthetic valve.  S/p tricuspid repair, trivial TR.  Trivial mitral regurgitation.  No evidence for endocarditis.  Normal caliber thoracic aorta.   [name] 05/23/2023 8:33 AM

## 2023-05-23 NOTE — Progress Notes (Signed)
ANTICOAGULATION CONSULT NOTE - Follow up Consult  Pharmacy Consult for bivalirudin Indication:  VA ECMO  Allergies  Allergen Reactions   Other Anaphylaxis    Mushrooms  Not listed on MAR    Plavix [Clopidogrel Bisulfate] Other (See Comments)    TTP Not listed on the [facility]   Fleet Enema [Enema] Other (See Comments)    Unknown reaction   Lovenox [Enoxaparin] Other (See Comments)    Unknown reaction   Morphine Other (See Comments)    Unknown reaction   Nsaids Other (See Comments)    Unknown reaction   Hydrocodone-Acetaminophen Itching    Patient Measurements: Height: 5\' 6"  (167.6 cm) Weight: 84.9 kg (187 lb 2.7 oz) IBW/kg (Calculated) : 63.8 Heparin Dosing Weight: 81.3 kg  Vital Signs: Temp: 97.9 F (36.6 C) (09/16 1700) Temp Source: Core (09/16 1600) Pulse Rate: 66 (09/16 1719)  Labs: Recent Labs    05/21/23 0340 05/21/23 0613 05/21/23 1440 05/21/23 1707 05/22/23 0337 05/22/23 0345 05/22/23 1634 05/22/23 1641 05/22/23 2042 05/23/23 0438 05/23/23 0439 05/23/23 1310 05/23/23 1620 05/23/23 1725  HGB 8.6*   < > 8.3*   < > 7.0*   < > 7.8*   < > 7.5* 7.3*   < > 8.5* 8.4* 8.5*  HCT 25.8*   < > 24.3*   < > 20.9*   < > 22.8*   < > 22.0* 21.8*   < > 25.2* 25.1* 25.0*  PLT 81*  --  93*  --  58*  --  49*  --   --  43*  --  39* 40*  --   APTT 52*  --  37*   < > 38*   < >  --   --  62* 60*  --   --  58*  --   LABPROT 16.8*  --   --   --  18.4*  --   --   --   --  21.5*  --   --   --   --   INR 1.3*  --   --   --  1.5*  --   --   --   --  1.8*  --   --   --   --   HEPARINUNFRC  --   --  <0.10*  --   --   --   --   --   --   --   --   --   --   --   CREATININE 1.91*  1.89*  --   --    < > 1.43*  --  1.31*  --   --  1.25*  --   --  1.26*  --    < > = values in this interval not displayed.    Estimated Creatinine Clearance: 61.3 mL/min (A) (by C-G formula based on SCr of 1.26 mg/dL (H)).   Medical History: Past Medical History:  Diagnosis Date   Allergy    Anemia     Anxiety    Baker's cyst of knee    Blood transfusion without reported diagnosis    as baby    Coronary artery disease    quadruple bypass - March 2016   GERD (gastroesophageal reflux disease)    Gouty arthritis    "real bad" (01/17/2013)   Heart murmur    Hypercholesteremia    Hypertension    MSSA bacteremia 01/29/2023   Myocardial infarction (HCC) 2017   PEA (Pulseless electrical activity) (HCC) 01/29/2023  Stroke (HCC)    Type II diabetes mellitus (HCC)     Medications:  Scheduled:   acetaminophen  1,000 mg Oral Q6H   Or   acetaminophen (TYLENOL) oral liquid 160 mg/5 mL  1,000 mg Per Tube Q6H   atorvastatin  80 mg Per Tube q1800   bisacodyl  10 mg Oral Daily   Or   bisacodyl  10 mg Rectal Daily   Chlorhexidine Gluconate Cloth  6 each Topical Daily   docusate  100 mg Per Tube BID   ezetimibe  10 mg Per Tube Daily   feeding supplement (PROSource TF20)  60 mL Per Tube Q4H   insulin aspart  0-15 Units Subcutaneous Q4H   multivitamin  1 tablet Per Tube BID   mupirocin ointment  1 Application Nasal BID   mouth rinse  15 mL Mouth Rinse Q2H   pantoprazole (PROTONIX) IV  40 mg Intravenous QHS   polyethylene glycol  17 g Per Tube Daily   sodium chloride flush  3 mL Intravenous Q12H    Assessment: 62 yom underwent Bentall/AVR, TV repair, re-implantation of SVG-RCA complicated with vasoplegia and pulmonary edema requiring central VA ECMO. No AC PTA.  Hgb 7.3 (plan for 1 unit PRBC), plt down to 43. INR 1.8. Fibrinogen up to 637, LDH slightly up to 1431. Slight oozy but stable. Circuit with slight fibrin formation - now improved. Stable on CRRT currently. aPTT therapeutic at 60 on bivalirudin 0.02mg /kg/hr.  PM: aPTT 58 on biva 0.02 mg/kg/hr. Per discussion with RN, slight increase in fibrin formation, oozing improved.  Goal of Therapy:  aPTT 50-70 seconds Monitor platelets by anticoagulation protocol: Yes   Plan:  Continue bivalirudin 0.02 mg/kg/hr  Monitor q12 hr aPTT and  CBC, and for s/sx of bleeding   Thank you for allowing pharmacy to participate in this patient's care,  Loralee Pacas, PharmD, BCPS 05/23/2023 5:37 PM  Please check AMION for all [facility] Pharmacy phone numbers After 10:00 PM, call Main Pharmacy [phone]

## 2023-05-23 NOTE — Progress Notes (Signed)
NAME:  [name], MRN:  [id number], DOB:  July 26, 1961, LOS: 5 ADMISSION DATE:  05/23/2023, CONSULTATION DATE:  9/11 REFERRING MD:  Dr. Izora Ribas, CHIEF COMPLAINT:  aortic valve dehiscence   History of Present Illness:  Patient is [name] w/ pertinent PMH CAD s/p CABG 2017 w/ AVR (magna ease for bicuspid valve disease), HLD, HTN, prior CVA, T2DM, CKD4 was previously on HD presents to [facility] on 9/10 chest pain.  Patient recently admitted to [facility] on 5/25 w/ AKI and AMS. While in ED patient had PEA arrest w/ ROSC in about 8 minutes. Patient required dialysis on this admission. Also w/ MSSA bacteremia associated w/ tricuspid valve endocarditis. Patient transferred to [facility] on 5/25. Treated w/ 6 week course oxacillin and rifampin. This admission also suffered a R cerebellar CVA w/ MRI likely septic emboli and had cholangitis/pancreatitis requiring ERCP.  On 9/10 patient admitted to [facility] w/ chest pain, dyspnea, and BLE edema. BNP 2,097. CXR w/ pulm edema. Patient started on IV lasix infusion. Cards consulted. On 9/11 patient transferred to [facility]. Patient's echo showing possible aortic valve dehiscence. Cultures repeated and started on rocephin. Patient transferred to ICU for TEE and general anesthesia and to start CRRT. PCCM consulted.  Pertinent  Medical History   Past Medical History:  Diagnosis Date   Allergy    Anemia    Anxiety    Baker's cyst of knee    Blood transfusion without reported diagnosis    as baby    Coronary artery disease    quadruple bypass - March 2016   GERD (gastroesophageal reflux disease)    Gouty arthritis    "real bad" (01/17/2013)   Heart murmur    Hypercholesteremia    Hypertension    MSSA bacteremia 01/29/2023   Myocardial infarction (HCC) 2017   PEA (Pulseless electrical activity) (HCC) 01/29/2023   Stroke (HCC)    Type II diabetes mellitus (HCC)      Significant Hospital Events: Including procedures, antibiotic start and stop dates  in addition to other pertinent events   9/10 admitted 9/11 echo showing aortic valve dehiscence, confirmed by TEE.  9/11 started (back) on CRRT for volume overload and known CKD 4 9/12 breathing better after CRRT 9/13 to OR for Redo of aortic valve, tricuspid valve repair and ascending aortic root replacement w/ re-implantation of SVG to RCA. Post op TEE EF 55% RV mid-mod HK, AVR/TV repair stable. Could not come off CPB. Worse pulmonary edema. High dose pressors. Cannulated and started on VA ECMO w/ central cannulation  9/16 MDT ECMO rounds this AM, plans to remove more volume, 1 U PRBCs   Interim History / Subjective:   Patient remains intubated critically ill on mechanical support, centrally cannulated to VA ECMO, on continuous CVVHD with right subclavian dialysis catheter  Objective   Blood pressure 101/60, pulse 71, temperature 97.7 F (36.5 C), resp. rate 15, height 5\' 6"  (1.676 m), weight 84.9 kg, SpO2 99%. PAP: (29-38)/(19-25) 36/23 CVP:  [4 mmHg-12 mmHg] 11 mmHg CO:  [3.1 L/min] 3.1 L/min CI:  [1.6 L/min/m2] 1.6 L/min/m2  Vent Mode: PRVC FiO2 (%):  [50 %] 50 % Set Rate:  [15 bmp] 15 bmp Vt Set:  [400 mL] 400 mL PEEP:  [5 cmH20] 5 cmH20 Plateau Pressure:  [26 cmH20-28 cmH20] 28 cmH20   Intake/Output Summary (Last 24 hours) at 05/23/2023 0855 Last data filed at 05/23/2023 0800 Gross per 24 hour  Intake 3305.02 ml  Output 5319.8 ml  Net -2014.78 ml   Filed Weights   05/21/23 0615 05/22/23 0615 05/23/23 0500  Weight: 83.1 kg 87.6 kg 84.9 kg    Examination: General: Chronically ill-appearing elderly gentleman, intubated on mechanical life support, chest open HEENT: NCAT  Heart: Open chest, S1-S2 auscultated Lungs: Mechanically ventilated lungs Abdomen: Soft mildly distended Extremities: Bilateral dependent edema  Chest x-ray: Reviewed, her right lung significant infiltrate  ABG reviewed, LDH reviewed, fibrinogen reviewed  ECMO circuit examined with Dr. Shirlee Latch and  ecmo specialist    Assessment & Plan:   Post cardiotomy vasoplegia/cardioplegia w/ failure to wean from CPB Acute on chronic combined HF  Hx MSSA endocarditis S/p AVR/Bentall and reimplantation of Coronaries and TV repair  Acute respiratory failure w/ hypoxia due to volume overload, with untreated OSA CKD 4. Had recently been on HD Circuit related hemolysis L>R BLE swelling ->LE [place] neg for DVT CAD s/p CABG in 2017 w/ AVR HTN HLD Hx of CVA DMT2  Plan: Patient remains critically ill in the intensive care unit O2 blender in place to maintain lower PaO2 since he is centrally cannulated Remains on pressors with goal mean arterial pressure greater than 60 Discussed turning down his VA flow rates today in an effort to prevent additional choking this appears stable. He is anemic which is multifactorial hemoglobin 7.3 additional 1 unit PRBCs given today. Multidisciplinary discussion with advanced heart failure service and cardiothoracic surgery at bedside. TEE today will start tube feeds afterwards Orders placed for core track. Continue CVVHD to pull negative fluid balance.   Best Practice (right click and "Reselect all SmartList Selections" daily)   Diet/type: Regular consistency (see orders) DVT prophylaxis: bival to start today GI prophylaxis: PPI Lines: Central line and Dialysis Catheter Foley: yes Code Status:  full code Last date of multidisciplinary goals of care discussion [pending]   This patient is critically ill with multiple organ system failure; which, requires frequent high complexity decision making, assessment, support, evaluation, and titration of therapies. This was completed through the application of advanced monitoring technologies and extensive interpretation of multiple databases. During this encounter critical care time was devoted to patient care services described in this note for 34 minutes.   [name], DO [facility] 05/23/2023 8:55 AM

## 2023-05-24 ENCOUNTER — Inpatient Hospital Stay (HOSPITAL_COMMUNITY): Payer: PPO

## 2023-05-24 DIAGNOSIS — E8770 Fluid overload, unspecified: Secondary | ICD-10-CM | POA: Diagnosis not present

## 2023-05-24 DIAGNOSIS — R570 Cardiogenic shock: Secondary | ICD-10-CM | POA: Diagnosis not present

## 2023-05-24 LAB — TYPE AND SCREEN
ABO/RH(D): B POS
Antibody Screen: NEGATIVE
Unit division: 0
Unit division: 0
Unit division: 0
Unit division: 0
Unit division: 0
Unit division: 0

## 2023-05-24 LAB — RENAL FUNCTION PANEL
Albumin: 2.4 g/dL — ABNORMAL LOW (ref 3.5–5.0)
Albumin: 2.3 g/dL — ABNORMAL LOW (ref 3.5–5.0)
Anion gap: 9 (ref 5–15)
Anion gap: 6 (ref 5–15)
BUN: 11 mg/dL (ref 8–23)
BUN: 14 mg/dL (ref 8–23)
CO2: 22 mmol/L (ref 22–32)
CO2: 25 mmol/L (ref 22–32)
Calcium: 8.1 mg/dL — ABNORMAL LOW (ref 8.9–10.3)
Calcium: 7.8 mg/dL — ABNORMAL LOW (ref 8.9–10.3)
Chloride: 103 mmol/L (ref 98–111)
Chloride: 105 mmol/L (ref 98–111)
Creatinine, Ser: 1.24 mg/dL (ref 0.61–1.24)
Creatinine, Ser: 1.29 mg/dL — ABNORMAL HIGH (ref 0.61–1.24)
GFR, Estimated: >60 mL/min (ref >60)
GFR, Estimated: >60 mL/min (ref >60)
Glucose, Bld: 115 mg/dL — ABNORMAL HIGH (ref 70–99)
Glucose, Bld: 154 mg/dL — ABNORMAL HIGH (ref 70–99)
Phosphorus: 1.7 mg/dL — ABNORMAL LOW (ref 2.5–4.6)
Phosphorus: 1.7 mg/dL — ABNORMAL LOW (ref 2.5–4.6)
Potassium: 3.8 mmol/L (ref 3.5–5.1)
Potassium: 3.5 mmol/L (ref 3.5–5.1)
Sodium: 134 mmol/L — ABNORMAL LOW (ref 135–145)
Sodium: 136 mmol/L (ref 135–145)

## 2023-05-24 LAB — POCT I-STAT 7, (LYTES, BLD GAS, ICA,H+H)
Acid-Base Excess: 0 mmol/L (ref 0.0–2.0)
Acid-Base Excess: 0 mmol/L (ref 0.0–2.0)
Acid-base deficit: 1 mmol/L (ref 0.0–2.0)
Acid-base deficit: 1 mmol/L (ref 0.0–2.0)
Bicarbonate: 24.5 mmol/L (ref 20.0–28.0)
Bicarbonate: 23.9 mmol/L (ref 20.0–28.0)
Bicarbonate: 25.4 mmol/L (ref 20.0–28.0)
Bicarbonate: 25.1 mmol/L (ref 20.0–28.0)
Calcium, Ion: 1.16 mmol/L (ref 1.15–1.40)
Calcium, Ion: 1.13 mmol/L — ABNORMAL LOW (ref 1.15–1.40)
Calcium, Ion: 1.14 mmol/L — ABNORMAL LOW (ref 1.15–1.40)
Calcium, Ion: 1.16 mmol/L (ref 1.15–1.40)
HCT: 26 % — ABNORMAL LOW (ref 39.0–52.0)
HCT: 26 % — ABNORMAL LOW (ref 39.0–52.0)
HCT: 26 % — ABNORMAL LOW (ref 39.0–52.0)
HCT: 24 % — ABNORMAL LOW (ref 39.0–52.0)
Hemoglobin: 8.8 g/dL — ABNORMAL LOW (ref 13.0–17.0)
Hemoglobin: 8.8 g/dL — ABNORMAL LOW (ref 13.0–17.0)
Hemoglobin: 8.8 g/dL — ABNORMAL LOW (ref 13.0–17.0)
Hemoglobin: 8.2 g/dL — ABNORMAL LOW (ref 13.0–17.0)
O2 Saturation: 100 %
O2 Saturation: 100 %
O2 Saturation: 99 %
O2 Saturation: 99 %
Patient temperature: 37.1
Patient temperature: 36.6
Patient temperature: 36.7
Patient temperature: 36.6
Potassium: 3.9 mmol/L (ref 3.5–5.1)
Potassium: 4 mmol/L (ref 3.5–5.1)
Potassium: 3.6 mmol/L (ref 3.5–5.1)
Potassium: 3.8 mmol/L (ref 3.5–5.1)
Sodium: 137 mmol/L (ref 135–145)
Sodium: 138 mmol/L (ref 135–145)
Sodium: 137 mmol/L (ref 135–145)
Sodium: 139 mmol/L (ref 135–145)
TCO2: 26 mmol/L (ref 22–32)
TCO2: 25 mmol/L (ref 22–32)
TCO2: 27 mmol/L (ref 22–32)
TCO2: 27 mmol/L (ref 22–32)
pCO2 arterial: 38.6 mmHg (ref 32–48)
pCO2 arterial: 37.4 mmHg (ref 32–48)
pCO2 arterial: 42.9 mmHg (ref 32–48)
pCO2 arterial: 46 mmHg (ref 32–48)
pH, Arterial: 7.41 (ref 7.35–7.45)
pH, Arterial: 7.411 (ref 7.35–7.45)
pH, Arterial: 7.379 (ref 7.35–7.45)
pH, Arterial: 7.343 — ABNORMAL LOW (ref 7.35–7.45)
pO2, Arterial: 171 mmHg — ABNORMAL HIGH (ref 83–108)
pO2, Arterial: 181 mmHg — ABNORMAL HIGH (ref 83–108)
pO2, Arterial: 147 mmHg — ABNORMAL HIGH (ref 83–108)
pO2, Arterial: 130 mmHg — ABNORMAL HIGH (ref 83–108)

## 2023-05-24 LAB — CBC
HCT: 25.3 % — ABNORMAL LOW (ref 39.0–52.0)
HCT: 24.3 % — ABNORMAL LOW (ref 39.0–52.0)
Hemoglobin: 8.7 g/dL — ABNORMAL LOW (ref 13.0–17.0)
Hemoglobin: 8 g/dL — ABNORMAL LOW (ref 13.0–17.0)
MCH: 31 pg (ref 26.0–34.0)
MCH: 29.6 pg (ref 26.0–34.0)
MCHC: 34.4 g/dL (ref 30.0–36.0)
MCHC: 32.9 g/dL (ref 30.0–36.0)
MCV: 90 fL (ref 80.0–100.0)
MCV: 90 fL (ref 80.0–100.0)
Platelets: 38 10*3/uL — ABNORMAL LOW (ref 150–400)
Platelets: 33 10*3/uL — ABNORMAL LOW (ref 150–400)
RBC: 2.81 MIL/uL — ABNORMAL LOW (ref 4.22–5.81)
RBC: 2.7 MIL/uL — ABNORMAL LOW (ref 4.22–5.81)
RDW: 18.3 % — ABNORMAL HIGH (ref 11.5–15.5)
RDW: 18.6 % — ABNORMAL HIGH (ref 11.5–15.5)
WBC: 52.6 10*3/uL (ref 4.0–10.5)
WBC: 48.4 10*3/uL — ABNORMAL HIGH (ref 4.0–10.5)
nRBC: 1 % — ABNORMAL HIGH (ref 0.0–0.2)
nRBC: 1.4 % — ABNORMAL HIGH (ref 0.0–0.2)

## 2023-05-24 LAB — GLUCOSE, CAPILLARY
Glucose-Capillary: 115 mg/dL — ABNORMAL HIGH (ref 70–99)
Glucose-Capillary: 125 mg/dL — ABNORMAL HIGH (ref 70–99)
Glucose-Capillary: 127 mg/dL — ABNORMAL HIGH (ref 70–99)
Glucose-Capillary: 155 mg/dL — ABNORMAL HIGH (ref 70–99)
Glucose-Capillary: 157 mg/dL — ABNORMAL HIGH (ref 70–99)
Glucose-Capillary: 187 mg/dL — ABNORMAL HIGH (ref 70–99)

## 2023-05-24 LAB — BPAM RBC
Blood Product Expiration Date: 202410042359
Blood Product Expiration Date: 202410072359
Blood Product Expiration Date: 202410072359
Blood Product Expiration Date: 202410072359
Blood Product Expiration Date: 202410072359
Blood Product Expiration Date: 202410102359
ISSUE DATE / TIME: 202409150446
ISSUE DATE / TIME: 202409160920
Unit Type and Rh: 7300
Unit Type and Rh: 7300
Unit Type and Rh: 7300
Unit Type and Rh: 7300
Unit Type and Rh: 7300
Unit Type and Rh: 7300

## 2023-05-24 LAB — HEPATIC FUNCTION PANEL
ALT: 11 U/L (ref 0–44)
AST: 68 U/L — ABNORMAL HIGH (ref 15–41)
Albumin: 2.4 g/dL — ABNORMAL LOW (ref 3.5–5.0)
Alkaline Phosphatase: 86 U/L (ref 38–126)
Bilirubin, Direct: 0.6 mg/dL — ABNORMAL HIGH (ref 0.0–0.2)
Indirect Bilirubin: 0.9 mg/dL (ref 0.3–0.9)
Total Bilirubin: 1.5 mg/dL — ABNORMAL HIGH (ref 0.3–1.2)
Total Protein: 6 g/dL — ABNORMAL LOW (ref 6.5–8.1)

## 2023-05-24 LAB — PROTIME-INR
INR: 1.8 — ABNORMAL HIGH (ref 0.8–1.2)
Prothrombin Time: 20.7 seconds — ABNORMAL HIGH (ref 11.4–15.2)

## 2023-05-24 LAB — APTT
aPTT: 66 s — ABNORMAL HIGH (ref 24–36)
aPTT: 76 s — ABNORMAL HIGH (ref 24–36)
aPTT: 66 s — ABNORMAL HIGH (ref 24–36)

## 2023-05-24 LAB — SURGICAL PATHOLOGY

## 2023-05-24 LAB — PREPARE RBC (CROSSMATCH)

## 2023-05-24 LAB — LACTIC ACID, PLASMA: Lactic Acid, Venous: 1.1 mmol/L (ref 0.5–1.9)

## 2023-05-24 LAB — MAGNESIUM
Magnesium: 2.4 mg/dL (ref 1.7–2.4)
Magnesium: 2.5 mg/dL — ABNORMAL HIGH (ref 1.7–2.4)

## 2023-05-24 LAB — LACTATE DEHYDROGENASE: LDH: 1353 U/L — ABNORMAL HIGH (ref 98–192)

## 2023-05-24 LAB — FIBRINOGEN: Fibrinogen: >800 mg/dL — ABNORMAL HIGH (ref 210–475)

## 2023-05-24 MED ORDER — SODIUM CHLORIDE 0.9% IV SOLUTION: Freq: Once | INTRAVENOUS | Status: AC

## 2023-05-24 MED ORDER — SODIUM PHOSPHATES 45 MMOLE/15ML IV SOLN
15.0000 mmol | Freq: Once | INTRAVENOUS | Status: AC
  Administered 2023-05-24: 15 mmol via INTRAVENOUS
  Filled 2023-05-24: qty 5

## 2023-05-24 NOTE — Plan of Care (Signed)
  Problem: Respiratory: Goal: Ability to maintain a clear airway and adequate ventilation will improve Outcome: Progressing   Problem: Metabolic: Goal: Ability to maintain appropriate glucose levels will improve Outcome: Progressing   Problem: Nutritional: Goal: Maintenance of adequate nutrition will improve Outcome: Progressing   Problem: Clinical Measurements: Goal: Diagnostic test results will improve Outcome: Progressing Goal: Respiratory complications will improve Outcome: Progressing Goal: Cardiovascular complication will be avoided Outcome: Progressing   Problem: Nutrition: Goal: Adequate nutrition will be maintained Outcome: Progressing   Problem: Safety: Goal: Ability to remain free from injury will improve Outcome: Progressing   Problem: Skin Integrity: Goal: Risk for impaired skin integrity will decrease Outcome: Progressing   Problem: Cardiac: Goal: Will achieve and/or maintain hemodynamic stability Outcome: Progressing

## 2023-05-24 NOTE — Progress Notes (Signed)
[name]  Assessment/ Plan: 80M recently recovered GFR from dialysis dependent AKI with dehiscence  of prosthetic aortic value and severe AI/TR.   # Anuric AKI/dialysis dependent AKI with fluid overload: Started on RRT during previous admission in May 2024 until 05/10/23 (recovered). Patient is status post cardiac surgery on 9/13 and currently on ECMO. CRRT start 9/11. He remains anuric with fluid overload.  Continue to run CRRT at current prescription.  UF as tolerated by BP and ECMO, Angiomax for anticoagulation.  UFG: net neg 100cc/hr. Discussed with ICU RN at bedside.  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  Per CTS  # AHRF: VDRF, per primary service. Abx per primary service to cover for PNA  # Postcardiotomy vasoplegia/shock with failure to wean: Currently on ECMO per CHF team.  On pressors per primary service.  Volume managing with CRRT.  # Hyperkalemia: Managed with CRRT prescription and follow labs. K stable today  # Anemia: Transfuse as needed per primary team.  Discussed with ICU RN.  Subjective: Seen and examined on CRRT.  Currently on ECMO, CRRT and multiple lines.  Neg neg ~2.4L Discussed with RN Uop: 28cc Pressors: epi, NE, vaso  Objective Vital signs in last 24 hours: Vitals:   05/24/23 0630 05/24/23 0645 05/24/23 0700 05/24/23 0806  BP:      Pulse: 87 88 82 75  Resp: 15 15 15    Temp: 97.9 F (36.6 C) 98.1 F (36.7 C) 98.1 F (36.7 C)   TempSrc:      SpO2: 100% 100% 100% 100%  Weight:      Height:       Weight change: -0.9 kg  Intake/Output Summary (Last 24 hours) at 05/24/2023 0815 Last data filed at 05/24/2023 0800 Gross per 24 hour  Intake 4850.92 ml  Output 7229.9 ml  Net -2378.98 ml       Labs: RENAL PANEL Recent Labs  Lab 05/29/2023 2118 05/21/23 0014 05/21/23 0340 05/21/23 6440 05/22/23 0337  05/22/23 0345 05/22/23 1634 05/22/23 1641 05/23/23 0438 05/23/23 0439 05/23/23 0945 05/23/23 1620 05/23/23 1725 05/23/23 1952 05/24/23 0411 05/24/23 0413  NA 144   < > 140  140   < > 137   < > 135   < > 135   < >  --  137 137 138 134* 137  K 5.5*   < > 5.3*  5.3*   < > 3.9   < > 3.6   < > 3.4*   < >  --  3.9 4.1 4.1 3.8 3.9  CL 110  --  106  107   < > 103  --  102  --  102  --   --  101  --   --  103  --   CO2 21*  --  22  21*   < > 23  --  23  --  23  --   --  23  --   --  22  --   GLUCOSE 112*  --  124*  123*   < > 135*  --  126*  --  114*  --   --  108*  --   --  115*  --   BUN 32*  --  29*  30*   < > 18  --  15  --  13  --   --  12  --   --  11  --   CREATININE 1.99*  --  1.91*  1.89*   < > 1.43*  --  1.31*  --  1.25*  --   --  1.26*  --   --  1.24  --   CALCIUM 8.1*  --  7.8*  7.8*   < > 7.8*  --  7.9*  --  7.9*  --   --  8.0*  --   --  8.1*  --   MG 2.4  --  2.3  --   --   --   --   --   --   --  2.3 2.4  --   --  2.4  --   PHOS 6.1*  --  5.4*   < > 3.9  --  2.8  --  2.3*  --  2.4* 2.0*  2.1*  --   --  1.7*  --   ALBUMIN <1.5*  --  2.6*   < > 2.9*  2.9*  --  2.7*  --  2.4*  2.4*  --   --  2.4*  --   --  2.4*  2.4*  --    < > = values in this interval not displayed.    Liver Function Tests: Recent Labs  Lab 05/22/23 0337 05/22/23 1634 05/23/23 0438 05/23/23 1620 05/24/23 0411  AST 83*  --  82*  --  68*  ALT 12  --  12  --  11  ALKPHOS 39  --  73  --  86  BILITOT 1.3*  --  1.2  --  1.5*  PROT 5.3*  --  5.5*  --  6.0*  ALBUMIN 2.9*  2.9*   < > 2.4*  2.4* 2.4* 2.4*  2.4*   < > = values in this interval not displayed.   No results for input(s): "LIPASE", "AMYLASE" in the last 168 hours. No results for input(s): "AMMONIA" in the last 168 hours. CBC: Recent Labs    08/12/22 0934 01/28/23 2355 05/28/2023 2116 05/18/23 0411 05/19/23 0636 05/23/2023 0309 05/23/23 1620 05/23/23 1725 05/23/23 1952 05/24/23 0411 05/24/23 0413  HGB 10.2*   < > 7.6*   < >  8.1*   < > 8.4* 8.5* 8.8* 8.7* 8.8*  MCV 94   < >  --    < > 89.3   < > 88.1  --   --  90.0  --   VITAMINB12  --   --   --   --  701  --   --   --   --   --   --   FERRITIN 537*  --  2,200*  --   --   --   --   --   --   --   --   TIBC 264  --  204*  --   --   --   --   --   --   --   --   IRON 83  --  140  --   --   --   --   --   --   --   --    < > = values in this interval not displayed.    Cardiac Enzymes: No results for input(s): "CKTOTAL", "CKMB", "CKMBINDEX", "TROPONINI" in the last 168 hours. CBG: Recent Labs  Lab 05/23/23 1601 05/23/23 1949 05/24/23 0006 05/24/23 0409 05/24/23 0810  GLUCAP 105* 109*  127* 115* 125*    Iron Studies: No results for input(s): "IRON", "TIBC", "TRANSFERRIN", "FERRITIN" in the last 72 hours. Studies/Results: ECHO TEE  Result Date: 05/23/2023    TRANSESOPHOGEAL ECHO REPORT   Patient Name:   [name] Date of Exam: 05/23/2023 Medical Rec #:  [id number]     Height:       66.0 in Accession #:    [id number]    Weight:       187.2 lb Date of Birth:  1961-01-31      BSA:          1.944 m Patient Age:    62 years      BP:           0/0 mmHg Patient Gender: M             HR:           59 bpm. Exam Location:  Inpatient Procedure: Transesophageal Echo, Color Doppler and Cardiac Doppler Indications:    CHF  History:        Patient has prior history of Echocardiogram examinations, most                 recent 05/27/2023. Previous Myocardial Infarction and CAD,                 Stroke, TV repair; Risk Factors:Diabetes and Hypertension.                 Aortic Valve: 25 mm Cryo AV conduit/AA replacement valve is                 present in the aortic position. Procedure Date: 05/23/2023.  Sonographer:    Milda Smart Referring Phys: [id number] DALTON S MCLEAN PROCEDURE: After discussion of the risks and benefits of a TEE, an informed consent was obtained from the patient. The patient was intubated. TEE procedure time was 15 minutes. The transesophogeal probe was passed without  difficulty through the esophogus  of the patient. Imaged were obtained with the patient in a supine position. Sedation performed by performing physician. Patients was under conscious sedation during this procedure. Image quality was adequate. The patient's vital signs; including heart rate, blood pressure, and oxygen saturation; remained stable throughout the procedure. The patient developed no complications during the procedure.  IMPRESSIONS  1. Left ventricular ejection fraction, by estimation, is 30%. The left ventricle has moderate to severely decreased function. The left ventricle demonstrates global hypokinesis. There is moderate left ventricular hypertrophy.  2. Right ventricular systolic function is moderately reduced. The right ventricular size is mildly enlarged. Moderately increased right ventricular wall thickness.  3. The tricuspid valve is has been repaired. There is mild tricuspid regurgitation.  4. Bioprosthetic aortic valve. Aortic valve regurgitation is trivial. No aortic stenosis is present.  5. The mitral valve is normal in structure. Trivial mitral valve regurgitation. No evidence of mitral stenosis.  6. No left atrial/left atrial appendage thrombus was detected.  7. No PFO/ASD by color doppler.  8. Aortic root/ascending aorta has been replaced, s/p Bentall. FINDINGS  Left Ventricle: Left ventricular ejection fraction, by estimation, is 30%. The left ventricle has moderate to severely decreased function. The left ventricle demonstrates global hypokinesis. The left ventricular internal cavity size was normal in size. There is moderate left ventricular hypertrophy. Right Ventricle: The right ventricular size is mildly enlarged. Moderately increased right ventricular wall thickness. Right ventricular systolic function is moderately reduced. Left Atrium: Left atrial size was normal in size. No  left atrial/left atrial appendage thrombus was detected. Right Atrium: Right atrial size was normal in  size. Pericardium: Trivial pericardial effusion is present. Mitral Valve: The mitral valve is normal in structure. Trivial mitral valve regurgitation. No evidence of mitral valve stenosis. Tricuspid Valve: The tricuspid valve is has been repaired/replaced. Tricuspid valve regurgitation is mild. Aortic Valve: The aortic valve has been repaired/replaced. Aortic valve regurgitation is trivial. No aortic stenosis is present. There is a 25 mm Cryo AV conduit/AA replacement valve present in the aortic position. Procedure Date: 05/21/2023. Pulmonic Valve: The pulmonic valve was normal in structure. Pulmonic valve regurgitation is not visualized. Aorta: The aortic root/ascending aorta has been repaired/replaced. IAS/Shunts: No PFO/ASD by color doppler. Additional Comments: Spectral Doppler performed. TRICUSPID VALVE TR Peak grad:   26.4 mmHg TR Vmax:        257.00 cm/s Dalton McleanMD Electronically signed by Wilfred Lacy Signature Date/Time: 05/23/2023/4:07:34 PM    Final    DG Abd Portable 1V  Result Date: 05/23/2023 CLINICAL DATA:  Feeding tube placement. EXAM: PORTABLE ABDOMEN - 1 VIEW COMPARISON:  None Available. FINDINGS: Tip of the weighted enteric tube in the right upper quadrant in the region of the distal stomach or proximal duodenum. Portions of additional support apparatus are partially included in the field of view. Relative paucity bowel gas in the included abdomen. IMPRESSION: Tip of the weighted enteric tube in the right upper quadrant in the region of the distal stomach or proximal duodenum. Electronically Signed   By: Narda Rutherford M.D.   On: 05/23/2023 15:05   DG CHEST PORT 1 VIEW  Result Date: 05/23/2023 CLINICAL DATA:  Patient on ECMO EXAM: PORTABLE CHEST 1 VIEW COMPARISON:  Prior chest x-ray 05/22/2023 FINDINGS: The patient is intubated. The tip of the endotracheal tube is 2.2 cm above the carina. ECMO apparatus are in place with 2 large bore cannula overlie the thoracic inlet in unchanged  position. There are currently clamped with hemostats. Right IJ approach tunneled hemodialysis catheter with the tip at the cavoatrial junction. Left IJ approach vascular sheath conveys a Swan-Ganz catheter into the heart. The tip of the Swan overlies the main pulmonary outflow tract. Additional ECMO tubing overlies the right atrium. Gastric tube in the stomach. Bilateral chest tubes are in place. Improving pulmonary edema with asymmetric improvement in the left lung versus the right. Persistent near confluent airspace opacity throughout the right lung. No pneumothorax. Stable cardiomegaly. IMPRESSION: 1. Interval change in position of the Swan-Ganz catheter which now overlies the main pulmonary outflow tract. 2. Other support apparatus in stable and satisfactory position. 3. Slightly improved aeration in the left lung with persistent near confluent airspace opacity throughout the right lung. 4. No pneumothorax. Electronically Signed   By: Malachy Moan M.D.   On: 05/23/2023 08:28    Medications: Infusions:   prismasol BGK 4/2.5 400 mL/hr at 05/24/23 0200    prismasol BGK 4/2.5 400 mL/hr at 05/24/23 0200   sodium chloride Stopped (05/24/23 0032)   sodium chloride Stopped (05/22/23 1310)   sodium chloride 10 mL/hr at 05/24/23 0800   anticoagulant sodium citrate     bivalirudin (ANGIOMAX) 250 mg in sodium chloride 0.9 % 500 mL (0.5 mg/mL) infusion 0.02 mg/kg/hr (05/24/23 0800)   dexmedetomidine (PRECEDEX) IV infusion 0.5 mcg/kg/hr (05/24/23 0800)   epinephrine 5 mcg/min (05/24/23 0800)   feeding supplement (PIVOT 1.5 CAL) 40 mL/hr at 05/24/23 0800   fentaNYL infusion INTRAVENOUS 200 mcg/hr (05/24/23 0800)   insulin Stopped (05/21/23 0754)  lactated ringers 10 mL/hr at 05/24/23 0532   meropenem (MERREM) IV Stopped (05/24/23 1610)   midazolam 10 mg/hr (05/24/23 0800)   norepinephrine (LEVOPHED) Adult infusion 2 mcg/min (05/24/23 0800)   prismasol BGK 4/2.5 1,500 mL/hr at 05/24/23 0757   sodium  phosphate 15 mmol in dextrose 5 % 250 mL infusion     vancomycin Stopped (05/23/23 2249)   vasopressin 0.03 Units/min (05/24/23 0800)    Scheduled Medications:  acetaminophen  1,000 mg Oral Q6H   Or   acetaminophen (TYLENOL) oral liquid 160 mg/5 mL  1,000 mg Per Tube Q6H   atorvastatin  80 mg Per Tube q1800   bisacodyl  10 mg Oral Daily   Or   bisacodyl  10 mg Rectal Daily   Chlorhexidine Gluconate Cloth  6 each Topical Daily   docusate  100 mg Per Tube BID   ezetimibe  10 mg Per Tube Daily   feeding supplement (PROSource TF20)  60 mL Per Tube Q4H   insulin aspart  0-15 Units Subcutaneous Q4H   multivitamin  1 tablet Per Tube BID   mupirocin ointment  1 Application Nasal BID   mouth rinse  15 mL Mouth Rinse Q2H   pantoprazole (PROTONIX) IV  40 mg Intravenous QHS   polyethylene glycol  17 g Per Tube Daily   sodium chloride flush  3 mL Intravenous Q12H    have reviewed scheduled and prn medications.  Physical Exam: General: Critically ill looking male, intubated, sedated, multiple lines Heart: S1S2 Lungs: Multiple lines and ECMO in the chest, coarse breath sound bilaterally, intubated Abdomen:soft,  non-distended Extremities: trace pitting edema b/l Les Neuro: sedated Dialysis Access: LIJ HD line   [name] 05/24/2023,8:15 AM  LOS: 6 days

## 2023-05-24 NOTE — Progress Notes (Signed)
   05/24/23 1124  Spiritual Encounters  Type of Visit Initial  Care provided to: [facility] present during encounter Nurse  Referral source Nurse (RN/NT/LPN)  Reason for visit Routine spiritual support  OnCall Visit No   Chaplain doing rounds on floor was asked by RN to speak with Pt's son who was in the waiting area as he appeared to be struggling.  Chaplain met son, Alycia Rossetti, in the waiting area and practiced compassionate presence.  Alycia Rossetti spoke of losing his mother 5 years ago, having a 65 year old at home, looking for work, and now trying to process his father's critical health and possibly losing him. Chaplain explored son's sources of hope and strength and prayed with him when asked.  Chaplain services remain available by Spiritual Consult or for emergent cases, paging [phone]  Chaplain Raelene Bott, MDiv [email or web address] [phone]

## 2023-05-24 NOTE — Progress Notes (Signed)
Patient ID: [name], male   DOB: [birth year], 61 y.o.   MRN: [id number]  TCTS Evening Rounds:  Hemodynamically stable today. Went up slightly on NE from 2 to 5. Removed 900 cc so far today. MAP 70's.  ECMO flow 3.0 l/min.  Woke up a little and vagaled today.   Continue present support and volume removal with CRRT.

## 2023-05-24 NOTE — Progress Notes (Signed)
ANTICOAGULATION CONSULT NOTE - Follow up Consult  Pharmacy Consult for bivalirudin Indication:  VA ECMO  Allergies  Allergen Reactions   Other Anaphylaxis    Mushrooms  Not listed on MAR    Plavix [Clopidogrel Bisulfate] Other (See Comments)    TTP Not listed on the [facility]   Fleet Enema [Enema] Other (See Comments)    Unknown reaction   Lovenox [Enoxaparin] Other (See Comments)    Unknown reaction   Morphine Other (See Comments)    Unknown reaction   Nsaids Other (See Comments)    Unknown reaction   Hydrocodone-Acetaminophen Itching    Patient Measurements: Height: 5\' 6"  (167.6 cm) Weight: 84 kg (185 lb 3 oz) IBW/kg (Calculated) : 63.8 Heparin Dosing Weight: 81.3 kg  Vital Signs: Temp: 97.7 F (36.5 C) (09/17 2230) Temp Source: Core (09/17 2000) Pulse Rate: 77 (09/17 2230)  Labs: Recent Labs    05/22/23 0337 05/22/23 0345 05/23/23 0438 05/23/23 0439 05/23/23 1620 05/23/23 1725 05/24/23 0411 05/24/23 0413 05/24/23 1600 05/24/23 1602 05/24/23 1956 05/24/23 2219  HGB 7.0*   < > 7.3*   < > 8.4*   < > 8.7*   < > 8.0* 8.8* 8.2*  --   HCT 20.9*   < > 21.8*   < > 25.1*   < > 25.3*   < > 24.3* 26.0* 24.0*  --   PLT 58*   < > 43*   < > 40*  --  38*  --  33*  --   --   --   APTT 38*   < > 60*  --  58*  --  66*  --  76*  --   --  66*  LABPROT 18.4*  --  21.5*  --   --   --  20.7*  --   --   --   --   --   INR 1.5*  --  1.8*  --   --   --  1.8*  --   --   --   --   --   CREATININE 1.43*   < > 1.25*  --  1.26*  --  1.24  --  1.29*  --   --   --    < > = values in this interval not displayed.    Estimated Creatinine Clearance: 59.6 mL/min (A) (by C-G formula based on SCr of 1.29 mg/dL (H)).   Assessment: 62 yom underwent Bentall/AVR, TV repair, re-implantation of SVG-RCA complicated with vasoplegia and pulmonary edema requiring central VA ECMO. No AC PTA.  aPTT now therapeutic at 66 sec.  No bleeding per RN  Goal of Therapy:  aPTT 50-70 seconds Monitor platelets  by anticoagulation protocol: Yes   Plan:  Continue bivalirudin at 0.017 mg/kg/hr  Monitor q12 hr aPTT and CBC, and for s/sx of bleeding   Arabella Merles, PharmD. Clinical Pharmacist 05/24/2023 11:00 PM

## 2023-05-24 NOTE — Progress Notes (Signed)
Patient ID: [name], male   DOB: 24-Sep-1960, 62 y.o.   MRN: [id number]     Advanced Heart Failure Rounding Note  PCP-Cardiologist: Dwayne Miss, MD   Subjective:    05-23-23: OR for Bentall/AVR, TV repair, re-implantation of SVG-RCA.  Post-op vasoplegia and pulmonary edema, required central VA ECMO (RA drainage, ascending aorta return.  Post-op TEE EF 55%, mild-moderate RV hypokinesis.  9/14: Blender added to circuit due to high PaO2 9/16: TEE with EF 30%, moderate LVH, moderate RV dysfunction with mild enlargement, stable bioprosthetic aortic valve, repaired TV with mild TR, no endocarditis noted.   MAP 70s on epinephrine 5, NE 2,  and vasopressin 0.03 (unchanged).  CVVH 100 cc/hr net negative UF, net negative 2440, weight down.  CXR with some clearing of left lung, right lung still with prominent airspace disease.     H/o MSSA endocarditis, currently on meropenem/vancomycin. WBCs 54 => 53K.   Hgb 8.7, plts 43 => 38  [place]wan: CVP 10 PA 27/15  Rhythm = junctional  Tube feeds running.   VA ECMO 2800 rpm Flow 3 L/min pVen -18 Delta P 20 Sweep 4.5 LDH 775 => 1020 => 1431=> 1353 Lactate 1.3 => 1.5 => 1.4 => 1.1 ABG 7.41/38.6/171   Objective:   Weight Range: 84 kg Body mass index is 29.89 kg/m.   Vital Signs:   Temp:  [97.7 F (36.5 C)-98.1 F (36.7 C)] 98.1 F (36.7 C) (09/17 0700) Pulse Rate:  [63-93] 82 (09/17 0700) Resp:  [0-17] 15 (09/17 0700) SpO2:  [97 %-100 %] 100 % (09/17 0700) Arterial Line BP: (74-99)/(65-81) 77/67 (09/17 0700) FiO2 (%):  [50 %] 50 % (09/17 0400) Weight:  [84 kg] 84 kg (09/17 0515) Last BM Date : 05/18/23  Weight change: Filed Weights   05/22/23 0615 05/23/23 0500 05/24/23 0515  Weight: 87.6 kg 84.9 kg 84 kg    Intake/Output:   Intake/Output Summary (Last 24 hours) at 05/24/2023 0737 Last data filed at 05/24/2023 0700 Gross per 24 hour  Intake 4788.55 ml  Output 7228.8 ml  Net -2440.25 ml      Physical Exam    General:  Intubated/sedated.  Neck: JVP difficult  Lungs: Decreased BS bilaterally CV: Nondisplaced PMI.  Heart regular S1/S2, no S3/S4, no murmur.  1+ ankle edema.     Abdomen: Soft, nontender, no hepatosplenomegaly, no distention.  Skin: Intact without lesions or rashes.  Neurologic: Sedated on vent Extremities: No clubbing or cyanosis.  HEENT: Normal.   Telemetry   Junctional rhythm 70s (personally reviewed)   Labs    CBC Recent Labs    05/23/23 1620 05/23/23 1725 05/24/23 0411 05/24/23 0413  WBC 53.7*  --  52.6*  --   HGB 8.4*   < > 8.7* 8.8*  HCT 25.1*   < > 25.3* 26.0*  MCV 88.1  --  90.0  --   PLT 40*  --  38*  --    < > = values in this interval not displayed.   Basic Metabolic Panel Recent Labs    40/98/11 1620 05/23/23 1725 05/24/23 0411 05/24/23 0413  NA 137   < > 134* 137  K 3.9   < > 3.8 3.9  CL 101  --  103  --   CO2 23  --  22  --   GLUCOSE 108*  --  115*  --   BUN 12  --  11  --   CREATININE 1.26*  --  1.24  --  CALCIUM 8.0*  --  8.1*  --   MG 2.4  --  2.4  --   PHOS 2.0*  2.1*  --  1.7*  --    < > = values in this interval not displayed.   Liver Function Tests Recent Labs    05/23/23 0438 05/23/23 1620 05/24/23 0411  AST 82*  --  68*  ALT 12  --  11  ALKPHOS 73  --  86  BILITOT 1.2  --  1.5*  PROT 5.5*  --  6.0*  ALBUMIN 2.4*  2.4* 2.4* 2.4*  2.4*   No results for input(s): "LIPASE", "AMYLASE" in the last 72 hours. Cardiac Enzymes No results for input(s): "CKTOTAL", "CKMB", "CKMBINDEX", "TROPONINI" in the last 72 hours.  BNP: BNP (last 3 results) Recent Labs    05/23/2023 1100  BNP 2,097.0*    ProBNP (last 3 results) No results for input(s): "PROBNP" in the last 8760 hours.   D-Dimer No results for input(s): "DDIMER" in the last 72 hours. Hemoglobin A1C No results for input(s): "HGBA1C" in the last 72 hours.  Fasting Lipid Panel No results for input(s): "CHOL", "HDL", "LDLCALC", "TRIG", "CHOLHDL", "LDLDIRECT" in the last 72  hours. Thyroid Function Tests No results for input(s): "TSH", "T4TOTAL", "T3FREE", "THYROIDAB" in the last 72 hours.  Invalid input(s): "FREET3"  Other results:   Imaging    ECHO TEE  Result Date: 05/23/2023    TRANSESOPHOGEAL ECHO REPORT   Patient Name:   [name] Date of Exam: 05/23/2023 Medical Rec #:  [id number]     Height:       66.0 in Accession #:    [id number]    Weight:       187.2 lb Date of Birth:  24-Apr-1961      BSA:          1.944 m Patient Age:    62 years      BP:           0/0 mmHg Patient Gender: M             HR:           59 bpm. Exam Location:  Inpatient Procedure: Transesophageal Echo, Color Doppler and Cardiac Doppler Indications:    CHF  History:        Patient has prior history of Echocardiogram examinations, most                 recent June 14, 2023. Previous Myocardial Infarction and CAD,                 Stroke, TV repair; Risk Factors:Diabetes and Hypertension.                 Aortic Valve: 25 mm Cryo AV conduit/AA replacement valve is                 present in the aortic position. Procedure Date: 06-14-2023.  Sonographer:    [name] Referring Phys: 31 [name] PROCEDURE: After discussion of the risks and benefits of a TEE, an informed consent was obtained from the patient. The patient was intubated. TEE procedure time was 15 minutes. The transesophogeal probe was passed without difficulty through the esophogus  of the patient. Imaged were obtained with the patient in a supine position. Sedation performed by performing physician. Patients was under conscious sedation during this procedure. Image quality was adequate. The patient's vital signs; including heart rate, blood pressure, and oxygen saturation; remained stable throughout  the procedure. The patient developed no complications during the procedure.  IMPRESSIONS  1. Left ventricular ejection fraction, by estimation, is 30%. The left ventricle has moderate to severely decreased function. The left ventricle  demonstrates global hypokinesis. There is moderate left ventricular hypertrophy.  2. Right ventricular systolic function is moderately reduced. The right ventricular size is mildly enlarged. Moderately increased right ventricular wall thickness.  3. The tricuspid valve is has been repaired. There is mild tricuspid regurgitation.  4. Bioprosthetic aortic valve. Aortic valve regurgitation is trivial. No aortic stenosis is present.  5. The mitral valve is normal in structure. Trivial mitral valve regurgitation. No evidence of mitral stenosis.  6. No left atrial/left atrial appendage thrombus was detected.  7. No PFO/ASD by color doppler.  8. Aortic root/ascending aorta has been replaced, s/p Bentall. FINDINGS  Left Ventricle: Left ventricular ejection fraction, by estimation, is 30%. The left ventricle has moderate to severely decreased function. The left ventricle demonstrates global hypokinesis. The left ventricular internal cavity size was normal in size. There is moderate left ventricular hypertrophy. Right Ventricle: The right ventricular size is mildly enlarged. Moderately increased right ventricular wall thickness. Right ventricular systolic function is moderately reduced. Left Atrium: Left atrial size was normal in size. No left atrial/left atrial appendage thrombus was detected. Right Atrium: Right atrial size was normal in size. Pericardium: Trivial pericardial effusion is present. Mitral Valve: The mitral valve is normal in structure. Trivial mitral valve regurgitation. No evidence of mitral valve stenosis. Tricuspid Valve: The tricuspid valve is has been repaired/replaced. Tricuspid valve regurgitation is mild. Aortic Valve: The aortic valve has been repaired/replaced. Aortic valve regurgitation is trivial. No aortic stenosis is present. There is a 25 mm Cryo AV conduit/AA replacement valve present in the aortic position. Procedure Date: 05/19/2023. Pulmonic Valve: The pulmonic valve was normal in structure.  Pulmonic valve regurgitation is not visualized. Aorta: The aortic root/ascending aorta has been repaired/replaced. IAS/Shunts: No PFO/ASD by color doppler. Additional Comments: Spectral Doppler performed. TRICUSPID VALVE TR Peak grad:   26.4 mmHg TR Vmax:        257.00 cm/s Selby Foisy McleanMD Electronically signed by Wilfred Lacy Signature Date/Time: 05/23/2023/4:07:34 PM    Final    DG Abd Portable 1V  Result Date: 05/23/2023 CLINICAL DATA:  Feeding tube placement. EXAM: PORTABLE ABDOMEN - 1 VIEW COMPARISON:  None Available. FINDINGS: Tip of the weighted enteric tube in the right upper quadrant in the region of the distal stomach or proximal duodenum. Portions of additional support apparatus are partially included in the field of view. Relative paucity bowel gas in the included abdomen. IMPRESSION: Tip of the weighted enteric tube in the right upper quadrant in the region of the distal stomach or proximal duodenum. Electronically Signed   By: Narda Rutherford M.D.   On: 05/23/2023 15:05     Medications:     Scheduled Medications:  acetaminophen  1,000 mg Oral Q6H   Or   acetaminophen (TYLENOL) oral liquid 160 mg/5 mL  1,000 mg Per Tube Q6H   atorvastatin  80 mg Per Tube q1800   bisacodyl  10 mg Oral Daily   Or   bisacodyl  10 mg Rectal Daily   Chlorhexidine Gluconate Cloth  6 each Topical Daily   docusate  100 mg Per Tube BID   ezetimibe  10 mg Per Tube Daily   feeding supplement (PROSource TF20)  60 mL Per Tube Q4H   insulin aspart  0-15 Units Subcutaneous Q4H   multivitamin  1  tablet Per Tube BID   mupirocin ointment  1 Application Nasal BID   mouth rinse  15 mL Mouth Rinse Q2H   pantoprazole (PROTONIX) IV  40 mg Intravenous QHS   polyethylene glycol  17 g Per Tube Daily   sodium chloride flush  3 mL Intravenous Q12H    Infusions:   prismasol BGK 4/2.5 400 mL/hr at 05/24/23 0200    prismasol BGK 4/2.5 400 mL/hr at 05/24/23 0200   sodium chloride Stopped (05/24/23 0032)   sodium  chloride Stopped (05/22/23 1310)   sodium chloride 10 mL/hr at 05/24/23 0700   anticoagulant sodium citrate     bivalirudin (ANGIOMAX) 250 mg in sodium chloride 0.9 % 500 mL (0.5 mg/mL) infusion 0.02 mg/kg/hr (05/24/23 0700)   dexmedetomidine (PRECEDEX) IV infusion 0.5 mcg/kg/hr (05/24/23 0700)   epinephrine 5 mcg/min (05/24/23 0700)   feeding supplement (PIVOT 1.5 CAL) 40 mL/hr at 05/24/23 0700   fentaNYL infusion INTRAVENOUS 200 mcg/hr (05/24/23 0700)   insulin Stopped (05/21/23 0754)   lactated ringers 10 mL/hr at 05/24/23 0532   meropenem (MERREM) IV Stopped (05/24/23 5638)   midazolam 10 mg/hr (05/24/23 0700)   norepinephrine (LEVOPHED) Adult infusion 2 mcg/min (05/24/23 0700)   prismasol BGK 4/2.5 1,500 mL/hr at 05/24/23 0401   vancomycin Stopped (05/23/23 2249)   vasopressin 0.03 Units/min (05/24/23 0700)    PRN Medications: sodium chloride, sodium chloride, anticoagulant sodium citrate, artificial tears, dextrose, fentaNYL, midazolam, midazolam, morphine injection, ondansetron (ZOFRAN) IV, mouth rinse, oxyCODONE, sodium chloride flush, traMADol    Assessment/Plan   1. Post-cardiotomy vasoplegia/shock with failure to wean from CPB: Now on central VA ECMO (06/04/2023) with RA drainage/ascending aorta return. Post-op echo with EF 55%, mild-moderate RV dysfunction. Unable to get images yesterday by TTE.  TEE on 9/16 showed EF 30%, moderate LVH, moderate RV dysfunction with mild enlargement, stable bioprosthetic aortic valve, repaired TV with mild TR, no endocarditis noted.  MAP around 70 now on epinephrine 5 + NE 2 + vasopressin 0.03, pulsatile arterial line.  CXR with clearing left lung, right lung with airspace disease.  I/Os net negative -2440 via CVVH and weight down.  CVP 10.  Keep LVAD flow around 3 L/min.  - Continue CVVH at net negative 100 cc/hr today.  - Continue current pressor support to allow CVVH.   - Continue bivalirudin today with goal PTT 50-70.  - ?Decannulation with  chest wall closure Thursday.  May need to transition to [facility] at that time depending on respiratory dynamics.  2. Acute hypoxic respiratory failure, post-op: On vent, rest settings. Blender added to keep PaO2 down, ABG stable today. CXR with some clearing of left lung, right lung with ongoing diffuse airspace disease.  - Continue CVVH to pull fluid.  - Meropenem/vancomycin for coverage of PNA 3. Severe prosthetic AI: Due to previous MSSA endocarditis with partial valve dehiscence s/p re-do AVR/Bentall with reimplantation of SVG-RCA and TV repair 05/10/2023.   - Continuing meropenem/vancomycin.  4. CAD s/p CABG/AVR 2017:  s/p AVR/Bentall and re-implantation of SVG-RCA 06/03/2023. - Off ASA with platelets down.  5. ESRD: CVVH as above.  6. DM2: management per CCM 7. Heart block: Post-op, now in stable junctional rhythm.   8. H/o CVA: Prior cerebellar CVA.  9. Thrombocytopenia: Post-op, 43 => 38 K today.  Suspect inflammatory.  10. Anemia: Post-op, transfuse hgb < 8. 8.7 today .  11. ID: H/o MSSA bacteremia.  WBCs 54 => 53K now.  - Meropenem + vancomycin, antibiotics broadened yesterday.   CRITICAL  CARE Performed by: [name]  Total critical care time: 45 minutes  Critical care time was exclusive of separately billable procedures and treating other patients.  Critical care was necessary to treat or prevent imminent or life-threatening deterioration.  Critical care was time spent personally by me on the following activities: development of treatment plan with patient and/or surrogate as well as nursing, discussions with consultants, evaluation of patient's response to treatment, examination of patient, obtaining history from patient or surrogate, ordering and performing treatments and interventions, ordering and review of laboratory studies, ordering and review of radiographic studies, pulse oximetry and re-evaluation of patient's condition.    Length of Stay: 6  [name], MD   05/24/2023, 7:37 AM  Advanced Heart Failure Team Pager [phone] (M-F; 7a - 5p)  Please contact [facility] for night-coverage after hours (5p -7a ) and weekends on [email or web address]

## 2023-05-24 NOTE — Progress Notes (Signed)
Patient ID: [name], male   DOB: [birth year], 62 y.o.   MRN: [id number]  Extracorporeal support note   ECLS support day: Indication: Post-op vasoplegia and pulmonary edema  Configuration: Central VA ECMO  Drainage cannula: RA Return cannula: Ascending aorta  Pump speed: 2800 rpm  Pump flow: 3.0 L/min Pump used: Cardiohelp  Sweep gas: 4.5 with blender to lower PaO2  Circuit check: No fibrin Anticoagulant: Bivalirudin Anticoagulation targets: PTT 50-70  Changes in support: none  Anticipated goals/duration of support: Wean to decannulation  .[name]  05/24/2023, 7:36 AM

## 2023-05-24 NOTE — Discharge Instructions (Signed)
Discharge Instructions:  1. You may shower, please wash incisions daily with soap and water and keep dry.  If you wish to cover wounds with dressing you may do so but please keep clean and change daily.  No tub baths or swimming until incisions have completely healed.  If your incisions become red or develop any drainage please call our office at [phone]  2. No Driving until cleared by Dr. Sharee Pimple office and you are no longer using narcotic pain medications  3. Monitor your weight daily.. Please use the same scale and weigh at same time... If you gain 5-10 lbs in 48 hours with associated lower extremity swelling, please contact our office at [phone]  4. Fever of 101.5 for at least 24 hours with no source, please contact our office at [phone]  5. Activity- up as tolerated, please walk at least 3 times per day.  Avoid strenuous activity, no lifting, pushing, or pulling with your arms over 8-10 lbs for a minimum of 6 weeks  6. If any questions or concerns arise, please do not hesitate to contact our office at [phone]

## 2023-05-24 NOTE — Progress Notes (Signed)
NAME:  [name], MRN:  [id number], DOB:  1960-10-13, LOS: 6 ADMISSION DATE:  15-Jun-2023, CONSULTATION DATE:  9/11 REFERRING MD:  Dr. Izora Ribas, CHIEF COMPLAINT:  aortic valve dehiscence   History of Present Illness:  Patient is [name] w/ pertinent PMH CAD s/p CABG 2017 w/ AVR (magna ease for bicuspid valve disease), HLD, HTN, prior CVA, T2DM, CKD4 was previously on HD presents to [facility] on 2023-06-15 chest pain.  Patient recently admitted to [facility] on 5/25 w/ AKI and AMS. While in ED patient had PEA arrest w/ ROSC in about 8 minutes. Patient required dialysis on this admission. Also w/ MSSA bacteremia associated w/ tricuspid valve endocarditis. Patient transferred to [facility] on 5/25. Treated w/ 6 week course oxacillin and rifampin. This admission also suffered a R cerebellar CVA w/ MRI likely septic emboli and had cholangitis/pancreatitis requiring ERCP.  On 06-15-2023 patient admitted to [facility] w/ chest pain, dyspnea, and BLE edema. BNP 2,097. CXR w/ pulm edema. Patient started on IV lasix infusion. Cards consulted. On 9/11 patient transferred to [facility] Patient's echo showing possible aortic valve dehiscence. Cultures repeated and started on rocephin. Patient transferred to ICU for TEE and general anesthesia and to start CRRT. PCCM consulted.  Pertinent  Medical History   Past Medical History:  Diagnosis Date   Allergy    Anemia    Anxiety    Baker's cyst of knee    Blood transfusion without reported diagnosis    as baby    Coronary artery disease    quadruple bypass - March 2016   GERD (gastroesophageal reflux disease)    Gouty arthritis    "real bad" (01/17/2013)   Heart murmur    Hypercholesteremia    Hypertension    MSSA bacteremia 01/29/2023   Myocardial infarction (HCC) 2017   PEA (Pulseless electrical activity) (HCC) 01/29/2023   Stroke (HCC)    Type II diabetes mellitus (HCC)      Significant Hospital Events: Including procedures, antibiotic start and stop dates  in addition to other pertinent events   06-15-2023 admitted 9/11 echo showing aortic valve dehiscence, confirmed by TEE.  9/11 started (back) on CRRT for volume overload and known CKD 4 9/12 breathing better after CRRT 9/13 to OR for Redo of aortic valve, tricuspid valve repair and ascending aortic root replacement w/ re-implantation of SVG to RCA. Post op TEE EF 55% RV mid-mod HK, AVR/TV repair stable. Could not come off CPB. Worse pulmonary edema. High dose pressors. Cannulated and started on VA ECMO w/ central cannulation  9/16 MDT ECMO rounds this AM, plans to remove more volume, 1 U PRBCs   Interim History / Subjective:   Patient remains critically ill intubated on mechanical support centrally cannulated with VA ECMO and continuous CVVHD through a right subclavian dialysis catheter.  No issues overnight.  Still pulling fluid.  Tolerating.  Pressure stable on low-dose norepinephrine vasopressin.  Objective   Blood pressure 101/60, pulse 74, temperature 98.1 F (36.7 C), resp. rate 16, height 5\' 6"  (1.676 m), weight 84 kg, SpO2 100%. PAP: (23-45)/(13-26) 25/17 CVP:  [1 mmHg-9 mmHg] 4 mmHg CO:  [2.6 L/min-4.4 L/min] 2.6 L/min CI:  [1.3 L/min/m2-2.23 L/min/m2] 1.3 L/min/m2  Vent Mode: PRVC FiO2 (%):  [50 %] 50 % Set Rate:  [15 bmp] 15 bmp Vt Set:  [400 mL] 400 mL PEEP:  [5 cmH20] 5 cmH20 Plateau Pressure:  [20 cmH20-30 cmH20] 26 cmH20   Intake/Output Summary (Last 24 hours) at 05/24/2023  2831 Last data filed at 05/24/2023 0900 Gross per 24 hour  Intake 4862.99 ml  Output 7082.2 ml  Net -2219.21 ml   Filed Weights   05/22/23 0615 05/23/23 0500 05/24/23 0515  Weight: 87.6 kg 84.9 kg 84 kg    Examination: General: Elderly gentleman chronically ill-appearing on mechanical support, chest open VA ECMO central cannulization HEENT: Endotracheal tube in place Heart: Open chest, S1-S2 auscultated Lungs: Bilateral mechanically ventilated breaths Abdomen: Soft nontender  nondistended Extremities: Bilateral dependent edema  Chest x-ray: Reviewed, still has significant right lung infiltrate and consolidation  ABG, LDH and fibrinogen reviewed labs stable  ECMO circuit examined.  With specialist.   Assessment & Plan:   Post cardiotomy vasoplegia/cardioplegia w/ failure to wean from CPB Acute on chronic combined HF  Hx MSSA endocarditis S/p AVR/Bentall and reimplantation of Coronaries and TV repair  Acute respiratory failure w/ hypoxia due to volume overload, with untreated OSA CKD 4. Had recently been on HD Circuit related hemolysis L>R BLE swelling ->LE [place] neg for DVT CAD s/p CABG in 2017 w/ AVR HTN HLD Hx of CVA DMT2  Plan: Patient remains critically ill on ECMO support. Discussed multidisciplinary rounding. Oxygen blender in place to lower PaO2 with central cannulization. Low-dose pressor requirement today. Dropped flow rates and his blood pressures seem to tolerate. At this time regarding plan to remove additional fluid hopefully a few liters over the next 2 days. Tentative plan would be for closure and central Decannulization on Thursday.  Possibly need for VV support Tolerating tube feeds.   Best Practice (right click and "Reselect all SmartList Selections" daily)   Diet/type: Regular consistency (see orders) DVT prophylaxis: bival to start today GI prophylaxis: PPI Lines: Central line and Dialysis Catheter Foley: yes Code Status:  full code Last date of multidisciplinary goals of care discussion [pending]  This patient is critically ill with multiple organ system failure; which, requires frequent high complexity decision making, assessment, support, evaluation, and titration of therapies. This was completed through the application of advanced monitoring technologies and extensive interpretation of multiple databases. During this encounter critical care time was devoted to patient care services described in this note for 32 minutes.    [name], DO  Pulmonary Critical Care 05/24/2023 9:14 AM

## 2023-05-24 NOTE — Progress Notes (Signed)
4 Days Post-Op Procedure(s) (LRB): REDO AORTIC VALVE REPLACEMENT (AVR) USING A 25 MM CRYO AORTIC VALVE CONDUIT (N/A) ASCENDING AORTIC ROOT REPLACEMENT (N/A) TRICUSPID VALVE REPAIR USING A 34 MM MC3 ANNULOPLASTY RING (N/A) TRANSESOPHAGEAL ECHOCARDIOGRAM (N/A) Subjective:  Hemodynamics stable overnight with no change in drips. Still vaso 0.31m epi 5, NE 2.  ECMO flow 3L/min ( keeping lower intentionally). Still has pulsatile arterial and PA waveforms. Removing over 100 cc/hr with CRRT. Had to change filter overnight.  -2285 cc for 24 hrs on CRRT.  Objective: Vital signs in last 24 hours: Temp:  [97.7 F (36.5 C)-98.1 F (36.7 C)] 97.9 F (36.6 C) (09/17 0530) Pulse Rate:  [63-90] 86 (09/17 0530) Cardiac Rhythm: Junctional rhythm;Other (Comment) (09/16 2000) Resp:  [0-17] 15 (09/17 0530) SpO2:  [97 %-100 %] 100 % (09/17 0530) Arterial Line BP: (74-99)/(65-81) 80/69 (09/17 0530) FiO2 (%):  [50 %] 50 % (09/17 0400) Weight:  [84 kg] 84 kg (09/17 0515)  Hemodynamic parameters for last 24 hours: PAP: (27-45)/(16-26) 27/16 CVP:  [3 mmHg-11 mmHg] 4 mmHg CO:  [2.8 L/min-4.4 L/min] 2.8 L/min CI:  [1.5 L/min/m2-2.23 L/min/m2] 1.5 L/min/m2  Intake/Output from previous day: 09/16 0701 - 09/17 0700 In: 4632.8 [I.V.:2584; Blood:293.3; UJ/WJ:1914.7; IV Piggyback:640.6] Out: 6917.8 [Urine:28; Emesis/NG output:190; Chest Tube:110] Intake/Output this shift: Total I/O In: 2288.2 [I.V.:1155.6; NG/GT:762.8; IV Piggyback:369.8] Out: 3525 [Urine:15; Chest Tube:10]  General appearance: intubated and sedated on vent Neurologic: unable to assess Heart: regular rate and rhythm, S1, S2 normal, no murmur Lungs: coarse and sounds consolidated on right. Abdomen: soft, hypoactive bowel sounds  Extremities: edema moderate Wound: chest dressing intact. There does not appear to be any significant hematoma under [place]. Minimal chest tube drainage.  Lab Results: Recent Labs    05/23/23 1620  05/23/23 1725 05/24/23 0411 05/24/23 0413  WBC 53.7*  --  52.6*  --   HGB 8.4*   < > 8.7* 8.8*  HCT 25.1*   < > 25.3* 26.0*  PLT 40*  --  38*  --    < > = values in this interval not displayed.   BMET:  Recent Labs    05/23/23 1620 05/23/23 1725 05/24/23 0411 05/24/23 0413  NA 137   < > 134* 137  K 3.9   < > 3.8 3.9  CL 101  --  103  --   CO2 23  --  22  --   GLUCOSE 108*  --  115*  --   BUN 12  --  11  --   CREATININE 1.26*  --  1.24  --   CALCIUM 8.0*  --  8.1*  --    < > = values in this interval not displayed.    PT/INR:  Recent Labs    05/24/23 0411  LABPROT 20.7*  INR 1.8*   ABG    Component Value Date/Time   PHART 7.410 05/24/2023 0413   HCO3 24.5 05/24/2023 0413   TCO2 26 05/24/2023 0413   ACIDBASEDEF 1.0 05/22/2023 0345   O2SAT 100 05/24/2023 0413   CBG (last 3)  Recent Labs    05/23/23 1949 05/24/23 0006 05/24/23 0409  GLUCAP 109* 127* 115*   CXR unchanged with more opacity in right lung than left.  Assessment/Plan: S/P Procedure(s) (LRB): REDO AORTIC VALVE REPLACEMENT (AVR) USING A 25 MM CRYO AORTIC VALVE CONDUIT (N/A) ASCENDING AORTIC ROOT REPLACEMENT (N/A) TRICUSPID VALVE REPAIR USING A 34 MM MC3 ANNULOPLASTY RING (N/A) TRANSESOPHAGEAL ECHOCARDIOGRAM (N/A)  POD 4  Hemodynamics stable  on low dose vasopressor. TEE yesterday showed decreased LV and RV function but I suspect some of that is that the ventricles are not filled. Trivial central AI as noted at time of surgery due to prominent nodulus on once of homograft leaflets. Trivial MR, mild TR.  Continue to remove volume today.  Broad spectrum antibiotics. WBC stable at 53 K which I suspect is inflammatory given rapid increase postop. Operative cultures and preop BC negative.  Thrombocytopenia stable. I don't think there is a need to transfuse plts at this time. Doubt HIT given time course of the drop but on bival anyway.  Continue bival for ECMO. No bleeding and Hgb  stable.  LOS: 6  days    Alleen Borne 05/24/2023

## 2023-05-24 NOTE — Progress Notes (Signed)
ANTICOAGULATION CONSULT NOTE - Follow up Consult  Pharmacy Consult for bivalirudin Indication:  VA ECMO  Allergies  Allergen Reactions   Other Anaphylaxis    Mushrooms  Not listed on MAR    Plavix [Clopidogrel Bisulfate] Other (See Comments)    TTP Not listed on the [facility]   Fleet Enema [Enema] Other (See Comments)    Unknown reaction   Lovenox [Enoxaparin] Other (See Comments)    Unknown reaction   Morphine Other (See Comments)    Unknown reaction   Nsaids Other (See Comments)    Unknown reaction   Hydrocodone-Acetaminophen Itching    Patient Measurements: Height: 5\' 6"  (167.6 cm) Weight: 84 kg (185 lb 3 oz) IBW/kg (Calculated) : 63.8 Heparin Dosing Weight: 81.3 kg  Vital Signs: Temp: 98.1 F (36.7 C) (09/17 0700) Temp Source: Core (09/16 2000) Pulse Rate: 82 (09/17 0700)  Labs: Recent Labs    05/21/23 1440 05/21/23 1707 05/22/23 0337 05/22/23 0345 05/23/23 0438 05/23/23 0439 05/23/23 1310 05/23/23 1620 05/23/23 1725 05/23/23 1952 05/24/23 0411 05/24/23 0413  HGB 8.3*   < > 7.0*   < > 7.3*   < > 8.5* 8.4*   < > 8.8* 8.7* 8.8*  HCT 24.3*   < > 20.9*   < > 21.8*   < > 25.2* 25.1*   < > 26.0* 25.3* 26.0*  PLT 93*  --  58*   < > 43*  --  39* 40*  --   --  38*  --   APTT 37*   < > 38*   < > 60*  --   --  58*  --   --  66*  --   LABPROT  --   --  18.4*  --  21.5*  --   --   --   --   --  20.7*  --   INR  --   --  1.5*  --  1.8*  --   --   --   --   --  1.8*  --   HEPARINUNFRC <0.10*  --   --   --   --   --   --   --   --   --   --   --   CREATININE  --    < > 1.43*   < > 1.25*  --   --  1.26*  --   --  1.24  --    < > = values in this interval not displayed.    Estimated Creatinine Clearance: 62 mL/min (by C-G formula based on SCr of 1.24 mg/dL).   Medical History: Past Medical History:  Diagnosis Date   Allergy    Anemia    Anxiety    Baker's cyst of knee    Blood transfusion without reported diagnosis    as baby    Coronary artery disease     quadruple bypass - March 2016   GERD (gastroesophageal reflux disease)    Gouty arthritis    "real bad" (01/17/2013)   Heart murmur    Hypercholesteremia    Hypertension    MSSA bacteremia 01/29/2023   Myocardial infarction (HCC) 2017   PEA (Pulseless electrical activity) (HCC) 01/29/2023   Stroke (HCC)    Type II diabetes mellitus (HCC)     Medications:  Scheduled:   acetaminophen  1,000 mg Oral Q6H   Or   acetaminophen (TYLENOL) oral liquid 160 mg/5 mL  1,000 mg Per Tube Q6H  atorvastatin  80 mg Per Tube q1800   bisacodyl  10 mg Oral Daily   Or   bisacodyl  10 mg Rectal Daily   Chlorhexidine Gluconate Cloth  6 each Topical Daily   docusate  100 mg Per Tube BID   ezetimibe  10 mg Per Tube Daily   feeding supplement (PROSource TF20)  60 mL Per Tube Q4H   insulin aspart  0-15 Units Subcutaneous Q4H   multivitamin  1 tablet Per Tube BID   mupirocin ointment  1 Application Nasal BID   mouth rinse  15 mL Mouth Rinse Q2H   pantoprazole (PROTONIX) IV  40 mg Intravenous QHS   polyethylene glycol  17 g Per Tube Daily   sodium chloride flush  3 mL Intravenous Q12H    Assessment: 62 yom underwent Bentall/AVR, TV repair, re-implantation of SVG-RCA complicated with vasoplegia and pulmonary edema requiring central VA ECMO. No AC PTA.  Hgb 8.8, plt down to 38. INR 1.8. Fibrinogen >800, LDH slightly down to 1353. Oozing has improved. Circuit with slight singular fibrin formation. Stable on CRRT currently. aPTT therapeutic at 66 on bivalirudin 0.02mg /kg/hr.  Goal of Therapy:  aPTT 50-70 seconds Monitor platelets by anticoagulation protocol: Yes   Plan:  Continue bivalirudin 0.02 mg/kg/hr  Monitor q12 hr aPTT and CBC, and for s/sx of bleeding   Thank you for allowing pharmacy to participate in this patient's care,  Sherron Monday, PharmD, BCCCP Clinical Pharmacist  Phone: [phone] 05/24/2023 7:26 AM  Please check AMION for all [facility] Pharmacy phone numbers After 10:00 PM, call Main  Pharmacy [phone]

## 2023-05-24 NOTE — Progress Notes (Signed)
ANTICOAGULATION CONSULT NOTE - Follow up Consult  Pharmacy Consult for bivalirudin Indication:  VA ECMO  Allergies  Allergen Reactions   Other Anaphylaxis    Mushrooms  Not listed on MAR    Plavix [Clopidogrel Bisulfate] Other (See Comments)    TTP Not listed on the [facility]   Fleet Enema [Enema] Other (See Comments)    Unknown reaction   Lovenox [Enoxaparin] Other (See Comments)    Unknown reaction   Morphine Other (See Comments)    Unknown reaction   Nsaids Other (See Comments)    Unknown reaction   Hydrocodone-Acetaminophen Itching    Patient Measurements: Height: 5\' 6"  (167.6 cm) Weight: 84 kg (185 lb 3 oz) IBW/kg (Calculated) : 63.8 Heparin Dosing Weight: 81.3 kg  Vital Signs: Temp: 98.1 F (36.7 C) (09/17 1745) Temp Source: Core (09/17 1600) Pulse Rate: 74 (09/17 1715)  Labs: Recent Labs    05/22/23 0337 05/22/23 0345 05/23/23 0438 05/23/23 0439 05/23/23 1620 05/23/23 1725 05/24/23 0411 05/24/23 0413 05/24/23 0813 05/24/23 1600 05/24/23 1602  HGB 7.0*   < > 7.3*   < > 8.4*   < > 8.7*   < > 8.8* 8.0* 8.8*  HCT 20.9*   < > 21.8*   < > 25.1*   < > 25.3*   < > 26.0* 24.3* 26.0*  PLT 58*   < > 43*   < > 40*  --  38*  --   --  33*  --   APTT 38*   < > 60*  --  58*  --  66*  --   --  76*  --   LABPROT 18.4*  --  21.5*  --   --   --  20.7*  --   --   --   --   INR 1.5*  --  1.8*  --   --   --  1.8*  --   --   --   --   CREATININE 1.43*   < > 1.25*  --  1.26*  --  1.24  --   --  1.29*  --    < > = values in this interval not displayed.    Estimated Creatinine Clearance: 59.6 mL/min (A) (by C-G formula based on SCr of 1.29 mg/dL (H)).   Assessment: 62 yom underwent Bentall/AVR, TV repair, re-implantation of SVG-RCA complicated with vasoplegia and pulmonary edema requiring central VA ECMO. No AC PTA.  aPTT now supra-therapeutic at 76 sec.  No bleeding per RN.  Goal of Therapy:  aPTT 50-70 seconds Monitor platelets by anticoagulation protocol: Yes   Plan:   Reduce bivalirudin to 0.017 mg/kg/hr  Check 4 hr aPTT Monitor q12 hr aPTT and CBC, and for s/sx of bleeding   Loreda Silverio D. Laney Potash, PharmD, BCPS, BCCCP 05/24/2023, 6:14 PM

## 2023-05-25 ENCOUNTER — Inpatient Hospital Stay (HOSPITAL_COMMUNITY): Payer: PPO

## 2023-05-25 ENCOUNTER — Encounter (HOSPITAL_COMMUNITY): Payer: Self-pay | Admitting: Surgery

## 2023-05-25 DIAGNOSIS — E8770 Fluid overload, unspecified: Secondary | ICD-10-CM | POA: Diagnosis not present

## 2023-05-25 DIAGNOSIS — R570 Cardiogenic shock: Secondary | ICD-10-CM | POA: Diagnosis not present

## 2023-05-25 DIAGNOSIS — T8201XA Breakdown (mechanical) of heart valve prosthesis, initial encounter: Secondary | ICD-10-CM | POA: Diagnosis not present

## 2023-05-25 DIAGNOSIS — Z515 Encounter for palliative care: Secondary | ICD-10-CM | POA: Diagnosis not present

## 2023-05-25 DIAGNOSIS — I5043 Acute on chronic combined systolic (congestive) and diastolic (congestive) heart failure: Secondary | ICD-10-CM | POA: Diagnosis not present

## 2023-05-25 LAB — POCT I-STAT 7, (LYTES, BLD GAS, ICA,H+H)
Acid-Base Excess: 0 mmol/L (ref 0.0–2.0)
Acid-Base Excess: 0 mmol/L (ref 0.0–2.0)
Acid-base deficit: 1 mmol/L (ref 0.0–2.0)
Acid-base deficit: 1 mmol/L (ref 0.0–2.0)
Acid-base deficit: 1 mmol/L (ref 0.0–2.0)
Acid-base deficit: 2 mmol/L (ref 0.0–2.0)
Acid-base deficit: 2 mmol/L (ref 0.0–2.0)
Bicarbonate: 23 mmol/L (ref 20.0–28.0)
Bicarbonate: 23.9 mmol/L (ref 20.0–28.0)
Bicarbonate: 23.9 mmol/L (ref 20.0–28.0)
Bicarbonate: 24 mmol/L (ref 20.0–28.0)
Bicarbonate: 24.7 mmol/L (ref 20.0–28.0)
Bicarbonate: 25.6 mmol/L (ref 20.0–28.0)
Bicarbonate: 25.8 mmol/L (ref 20.0–28.0)
Calcium, Ion: 1.12 mmol/L — ABNORMAL LOW (ref 1.15–1.40)
Calcium, Ion: 1.12 mmol/L — ABNORMAL LOW (ref 1.15–1.40)
Calcium, Ion: 1.14 mmol/L — ABNORMAL LOW (ref 1.15–1.40)
Calcium, Ion: 1.14 mmol/L — ABNORMAL LOW (ref 1.15–1.40)
Calcium, Ion: 1.16 mmol/L (ref 1.15–1.40)
Calcium, Ion: 1.16 mmol/L (ref 1.15–1.40)
Calcium, Ion: 1.16 mmol/L (ref 1.15–1.40)
HCT: 27 % — ABNORMAL LOW (ref 39.0–52.0)
HCT: 27 % — ABNORMAL LOW (ref 39.0–52.0)
HCT: 28 % — ABNORMAL LOW (ref 39.0–52.0)
HCT: 29 % — ABNORMAL LOW (ref 39.0–52.0)
HCT: 30 % — ABNORMAL LOW (ref 39.0–52.0)
HCT: 30 % — ABNORMAL LOW (ref 39.0–52.0)
HCT: 30 % — ABNORMAL LOW (ref 39.0–52.0)
Hemoglobin: 10.2 g/dL — ABNORMAL LOW (ref 13.0–17.0)
Hemoglobin: 10.2 g/dL — ABNORMAL LOW (ref 13.0–17.0)
Hemoglobin: 10.2 g/dL — ABNORMAL LOW (ref 13.0–17.0)
Hemoglobin: 9.2 g/dL — ABNORMAL LOW (ref 13.0–17.0)
Hemoglobin: 9.2 g/dL — ABNORMAL LOW (ref 13.0–17.0)
Hemoglobin: 9.5 g/dL — ABNORMAL LOW (ref 13.0–17.0)
Hemoglobin: 9.9 g/dL — ABNORMAL LOW (ref 13.0–17.0)
O2 Saturation: 100 %
O2 Saturation: 100 %
O2 Saturation: 100 %
O2 Saturation: 100 %
O2 Saturation: 99 %
O2 Saturation: 99 %
O2 Saturation: 99 %
Patient temperature: 36.5
Patient temperature: 36.6
Patient temperature: 36.6
Patient temperature: 36.6
Patient temperature: 36.8
Patient temperature: 37
Patient temperature: 37
Potassium: 3.3 mmol/L — ABNORMAL LOW (ref 3.5–5.1)
Potassium: 3.4 mmol/L — ABNORMAL LOW (ref 3.5–5.1)
Potassium: 3.7 mmol/L (ref 3.5–5.1)
Potassium: 4.1 mmol/L (ref 3.5–5.1)
Potassium: 4.1 mmol/L (ref 3.5–5.1)
Potassium: 4.2 mmol/L (ref 3.5–5.1)
Potassium: 4.3 mmol/L (ref 3.5–5.1)
Sodium: 138 mmol/L (ref 135–145)
Sodium: 138 mmol/L (ref 135–145)
Sodium: 138 mmol/L (ref 135–145)
Sodium: 138 mmol/L (ref 135–145)
Sodium: 139 mmol/L (ref 135–145)
Sodium: 139 mmol/L (ref 135–145)
Sodium: 139 mmol/L (ref 135–145)
TCO2: 24 mmol/L (ref 22–32)
TCO2: 25 mmol/L (ref 22–32)
TCO2: 25 mmol/L (ref 22–32)
TCO2: 25 mmol/L (ref 22–32)
TCO2: 26 mmol/L (ref 22–32)
TCO2: 27 mmol/L (ref 22–32)
TCO2: 27 mmol/L (ref 22–32)
pCO2 arterial: 34.3 mmHg (ref 32–48)
pCO2 arterial: 40.9 mmHg (ref 32–48)
pCO2 arterial: 43.5 mmHg (ref 32–48)
pCO2 arterial: 44 mmHg (ref 32–48)
pCO2 arterial: 44.7 mmHg (ref 32–48)
pCO2 arterial: 45.7 mmHg (ref 32–48)
pCO2 arterial: 46.5 mmHg (ref 32–48)
pH, Arterial: 7.336 — ABNORMAL LOW (ref 7.35–7.45)
pH, Arterial: 7.34 — ABNORMAL LOW (ref 7.35–7.45)
pH, Arterial: 7.343 — ABNORMAL LOW (ref 7.35–7.45)
pH, Arterial: 7.349 — ABNORMAL LOW (ref 7.35–7.45)
pH, Arterial: 7.373 (ref 7.35–7.45)
pH, Arterial: 7.377 (ref 7.35–7.45)
pH, Arterial: 7.432 (ref 7.35–7.45)
pO2, Arterial: 124 mmHg — ABNORMAL HIGH (ref 83–108)
pO2, Arterial: 132 mmHg — ABNORMAL HIGH (ref 83–108)
pO2, Arterial: 163 mmHg — ABNORMAL HIGH (ref 83–108)
pO2, Arterial: 216 mmHg — ABNORMAL HIGH (ref 83–108)
pO2, Arterial: 251 mmHg — ABNORMAL HIGH (ref 83–108)
pO2, Arterial: 263 mmHg — ABNORMAL HIGH (ref 83–108)
pO2, Arterial: 290 mmHg — ABNORMAL HIGH (ref 83–108)

## 2023-05-25 LAB — CG4 I-STAT (LACTIC ACID)
Lactic Acid, Venous: 1.5 mmol/L (ref 0.5–1.9)
Lactic Acid, Venous: 1.2 mmol/L (ref 0.5–1.9)

## 2023-05-25 LAB — CBC
HCT: 27.3 % — ABNORMAL LOW (ref 39.0–52.0)
HCT: 28.6 % — ABNORMAL LOW (ref 39.0–52.0)
Hemoglobin: 9 g/dL — ABNORMAL LOW (ref 13.0–17.0)
Hemoglobin: 9.3 g/dL — ABNORMAL LOW (ref 13.0–17.0)
MCH: 29.4 pg (ref 26.0–34.0)
MCH: 29.1 pg (ref 26.0–34.0)
MCHC: 33 g/dL (ref 30.0–36.0)
MCHC: 32.5 g/dL (ref 30.0–36.0)
MCV: 89.2 fL (ref 80.0–100.0)
MCV: 89.4 fL (ref 80.0–100.0)
Platelets: 28 10*3/uL — CL (ref 150–400)
Platelets: 24 10*3/uL — CL (ref 150–400)
RBC: 3.06 MIL/uL — ABNORMAL LOW (ref 4.22–5.81)
RBC: 3.2 MIL/uL — ABNORMAL LOW (ref 4.22–5.81)
RDW: 18.6 % — ABNORMAL HIGH (ref 11.5–15.5)
RDW: 19 % — ABNORMAL HIGH (ref 11.5–15.5)
WBC: 44.9 10*3/uL — ABNORMAL HIGH (ref 4.0–10.5)
WBC: 48.9 10*3/uL — ABNORMAL HIGH (ref 4.0–10.5)
nRBC: 1.8 % — ABNORMAL HIGH (ref 0.0–0.2)
nRBC: 1.4 % — ABNORMAL HIGH (ref 0.0–0.2)

## 2023-05-25 LAB — AEROBIC/ANAEROBIC CULTURE W GRAM STAIN (SURGICAL/DEEP WOUND)
Culture: NO GROWTH
Culture: NO GROWTH
Gram Stain: NONE SEEN
Gram Stain: NONE SEEN

## 2023-05-25 LAB — HEPATIC FUNCTION PANEL
ALT: 9 U/L (ref 0–44)
AST: 48 U/L — ABNORMAL HIGH (ref 15–41)
Albumin: 2.2 g/dL — ABNORMAL LOW (ref 3.5–5.0)
Alkaline Phosphatase: 115 U/L (ref 38–126)
Bilirubin, Direct: 0.4 mg/dL — ABNORMAL HIGH (ref 0.0–0.2)
Indirect Bilirubin: 0.8 mg/dL (ref 0.3–0.9)
Total Bilirubin: 1.2 mg/dL (ref 0.3–1.2)
Total Protein: 5.9 g/dL — ABNORMAL LOW (ref 6.5–8.1)

## 2023-05-25 LAB — RENAL FUNCTION PANEL
Albumin: 2.2 g/dL — ABNORMAL LOW (ref 3.5–5.0)
Albumin: 2.5 g/dL — ABNORMAL LOW (ref 3.5–5.0)
Anion gap: 10 (ref 5–15)
Anion gap: 11 (ref 5–15)
BUN: 24 mg/dL — ABNORMAL HIGH (ref 8–23)
BUN: 23 mg/dL (ref 8–23)
CO2: 23 mmol/L (ref 22–32)
CO2: 22 mmol/L (ref 22–32)
Calcium: 8.1 mg/dL — ABNORMAL LOW (ref 8.9–10.3)
Calcium: 8.2 mg/dL — ABNORMAL LOW (ref 8.9–10.3)
Chloride: 105 mmol/L (ref 98–111)
Chloride: 102 mmol/L (ref 98–111)
Creatinine, Ser: 1.19 mg/dL (ref 0.61–1.24)
Creatinine, Ser: 1.14 mg/dL (ref 0.61–1.24)
GFR, Estimated: >60 mL/min (ref >60)
GFR, Estimated: >60 mL/min (ref >60)
Glucose, Bld: 225 mg/dL — ABNORMAL HIGH (ref 70–99)
Glucose, Bld: 125 mg/dL — ABNORMAL HIGH (ref 70–99)
Phosphorus: <1 mg/dL — CL (ref 2.5–4.6)
Phosphorus: 1.6 mg/dL — ABNORMAL LOW (ref 2.5–4.6)
Potassium: 3.4 mmol/L — ABNORMAL LOW (ref 3.5–5.1)
Potassium: 4 mmol/L (ref 3.5–5.1)
Sodium: 138 mmol/L (ref 135–145)
Sodium: 135 mmol/L (ref 135–145)

## 2023-05-25 LAB — GLUCOSE, CAPILLARY
Glucose-Capillary: 105 mg/dL — ABNORMAL HIGH (ref 70–99)
Glucose-Capillary: 132 mg/dL — ABNORMAL HIGH (ref 70–99)
Glucose-Capillary: 137 mg/dL — ABNORMAL HIGH (ref 70–99)
Glucose-Capillary: 182 mg/dL — ABNORMAL HIGH (ref 70–99)
Glucose-Capillary: 209 mg/dL — ABNORMAL HIGH (ref 70–99)
Glucose-Capillary: 225 mg/dL — ABNORMAL HIGH (ref 70–99)
Glucose-Capillary: 228 mg/dL — ABNORMAL HIGH (ref 70–99)

## 2023-05-25 LAB — LACTATE DEHYDROGENASE: LDH: 1209 U/L — ABNORMAL HIGH (ref 98–192)

## 2023-05-25 LAB — FIBRINOGEN: Fibrinogen: >800 mg/dL — ABNORMAL HIGH (ref 210–475)

## 2023-05-25 LAB — PROTIME-INR
INR: 1.6 — ABNORMAL HIGH (ref 0.8–1.2)
Prothrombin Time: 19.5 seconds — ABNORMAL HIGH (ref 11.4–15.2)

## 2023-05-25 LAB — LACTIC ACID, PLASMA: Lactic Acid, Venous: 2.1 mmol/L (ref 0.5–1.9)

## 2023-05-25 LAB — PREPARE RBC (CROSSMATCH)

## 2023-05-25 MED ORDER — POTASSIUM CHLORIDE 20 MEQ PO PACK: 20.0000 meq | PACK | Freq: Once | ORAL | Status: DC

## 2023-05-25 MED ORDER — ROCURONIUM BROMIDE 10 MG/ML (PF) SYRINGE
PREFILLED_SYRINGE | INTRAVENOUS | Status: AC
  Filled 2023-05-25: qty 10

## 2023-05-25 MED ORDER — POTASSIUM PHOSPHATES 15 MMOLE/5ML IV SOLN
30.0000 mmol | Freq: Once | INTRAVENOUS | Status: AC
  Administered 2023-05-25: 30 mmol via INTRAVENOUS
  Filled 2023-05-25: qty 10

## 2023-05-25 MED ORDER — SORBITOL 70 % SOLN
60.0000 mL | Freq: Once | Status: AC
  Administered 2023-05-25: 60 mL
  Filled 2023-05-25: qty 60

## 2023-05-25 MED ORDER — ALBUMIN HUMAN 5 % IV SOLN
INTRAVENOUS | Status: AC
  Administered 2023-05-25: 12.5 g via INTRAVENOUS
  Filled 2023-05-25: qty 250

## 2023-05-25 MED ORDER — POLYETHYLENE GLYCOL 3350 17 G PO PACK
17.0000 g | PACK | Freq: Two times a day (BID) | ORAL | Status: DC
  Administered 2023-05-25 – 2023-05-27 (×4): 17 g
  Filled 2023-05-25 (×5): qty 1

## 2023-05-25 MED ORDER — HYDROMORPHONE HCL 1 MG/ML IJ SOLN
1.0000 mg | Freq: Once | INTRAMUSCULAR | Status: AC
  Administered 2023-05-25: 1 mg via INTRAVENOUS

## 2023-05-25 MED ORDER — ANTICOAGULANT SODIUM CITRATE 4% (200MG/5ML) IV SOLN
5.0000 mL | Freq: Once | Status: AC
  Administered 2023-05-26: 3.98 mL
  Filled 2023-05-25: qty 5

## 2023-05-25 MED ORDER — POTASSIUM CHLORIDE 10 MEQ/100ML IV SOLN: 10.0000 meq | INTRAVENOUS | Status: DC

## 2023-05-25 MED ORDER — HYDROMORPHONE HCL-NACL 50-0.9 MG/50ML-% IV SOLN
0.5000 mg/h | INTRAVENOUS | Status: DC
  Administered 2023-05-25 – 2023-05-30 (×12): 4 mg/h via INTRAVENOUS
  Administered 2023-05-30 – 2023-06-01 (×2): 3 mg/h via INTRAVENOUS
  Administered 2023-06-01: 4 mg/h via INTRAVENOUS
  Filled 2023-05-25 (×17): qty 50

## 2023-05-25 MED ORDER — SODIUM CHLORIDE 0.9% IV SOLUTION: Freq: Once | INTRAVENOUS | Status: DC

## 2023-05-25 MED ORDER — METOCLOPRAMIDE HCL 5 MG/5ML PO SOLN
5.0000 mg | Freq: Four times a day (QID) | ORAL | Status: DC
  Filled 2023-05-25 (×2): qty 10

## 2023-05-25 MED ORDER — POTASSIUM CHLORIDE 10 MEQ/50ML IV SOLN
10.0000 meq | INTRAVENOUS | Status: AC
  Administered 2023-05-25 (×2): 10 meq via INTRAVENOUS
  Filled 2023-05-25 (×2): qty 50

## 2023-05-25 MED ORDER — METOCLOPRAMIDE HCL 10 MG PO TABS: 5.0000 mg | ORAL_TABLET | Freq: Four times a day (QID) | ORAL | Status: DC

## 2023-05-25 MED ORDER — METOCLOPRAMIDE HCL 5 MG/ML IJ SOLN
5.0000 mg | Freq: Four times a day (QID) | INTRAMUSCULAR | Status: DC
  Administered 2023-05-25 – 2023-06-04 (×40): 5 mg via INTRAVENOUS
  Filled 2023-05-25 (×40): qty 2

## 2023-05-25 MED ORDER — SODIUM PHOSPHATES 45 MMOLE/15ML IV SOLN
30.0000 mmol | Freq: Once | INTRAVENOUS | Status: DC
  Filled 2023-05-25: qty 10

## 2023-05-25 MED ORDER — HYDROMORPHONE BOLUS VIA INFUSION
0.2500 mg | INTRAVENOUS | Status: DC | PRN
  Administered 2023-05-31: 1 mg via INTRAVENOUS

## 2023-05-25 MED ORDER — NOREPINEPHRINE 16 MG/250ML-% IV SOLN
0.0000 ug/min | INTRAVENOUS | Status: DC
  Administered 2023-05-25: 2 ug/min via INTRAVENOUS
  Administered 2023-05-26: 16 ug/min via INTRAVENOUS
  Administered 2023-05-27: 15 ug/min via INTRAVENOUS
  Administered 2023-05-27: 14 ug/min via INTRAVENOUS
  Administered 2023-05-28: 10 ug/min via INTRAVENOUS
  Administered 2023-05-28: 13 ug/min via INTRAVENOUS
  Administered 2023-05-31: 2 ug/min via INTRAVENOUS
  Administered 2023-06-07: 7 ug/min via INTRAVENOUS
  Administered 2023-06-08: 8 ug/min via INTRAVENOUS
  Administered 2023-06-10: 23 ug/min via INTRAVENOUS
  Administered 2023-06-11: 31 ug/min via INTRAVENOUS
  Administered 2023-06-11: 32 ug/min via INTRAVENOUS
  Administered 2023-06-11: 16 ug/min via INTRAVENOUS
  Administered 2023-06-12: 31 ug/min via INTRAVENOUS
  Filled 2023-05-25 (×17): qty 250

## 2023-05-25 MED ORDER — ALBUMIN HUMAN 5 % IV SOLN: 12.5000 g | Freq: Once | INTRAVENOUS | Status: AC

## 2023-05-25 MED ORDER — INSULIN ASPART 100 UNIT/ML IJ SOLN: 2.0000 [IU] | INTRAMUSCULAR | Status: DC

## 2023-05-25 NOTE — Procedures (Addendum)
Bronchoscopy Procedure Note  [name]  [id number]  Mar 09, 1961  Date:05/25/23  Time:9:10 AM   Provider Performing:Essie Gehret [name]   Procedure(s):  Flexible Bronchoscopy (29528) and Initial Therapeutic Aspiration of Tracheobronchial Tree (41324)  Indication(s) ECMO, mucus plugging, blood clots   Consent Risks of the procedure as well as the alternatives and risks of each were explained to the patient and/or caregiver.  Consent for the procedure was obtained and is signed in the bedside chart  Anesthesia Sedate on vent,dilaudid and versed    Time Out Verified patient identification, verified procedure, site/side was marked, verified correct patient position, special equipment/implants available, medications/allergies/relevant history reviewed, required imaging and test results available.   Sterile Technique Usual hand hygiene, masks, gowns, and gloves were used   Procedure Description Bronchoscope advanced through endotracheal tube and into airway.  Airways were examined down to subsegmental level with findings noted below.    Findings:  There were bilateral mucus plugs and clots within the airway.  The scope was used for therapeutic suctioning and clearance.  Saline was used for irrigation.  The distal airways were clear at the termination of the procedure.    Complications/Tolerance None; patient tolerated the procedure well. Chest X-ray is not needed post procedure.   EBL Minimal   Specimen(s) None   Dwayne Igo, DO [facility] 05/25/2023 9:17 AM

## 2023-05-25 NOTE — Progress Notes (Signed)
Nutrition Follow-up  DOCUMENTATION CODES:   Not applicable  INTERVENTION:   Recommend considering repeat abd xray WITH contrast if confirmation of post-pyloric position is desired.  TF on hold, bowel regimen increased, added reglan, monitoring lactic acid trend. Recommend adjusting bowel regimen if continues with constipation by addition of enema and/or ?neostigmine/relistor  Tube Feeding Recommendations via Cortrak: Recommend considering trial of trickle TF later today Pivot 1.5 at 20 ml/hr Pro-Source TF20 60 mL q 4 hours  Phosphorus critically low at <1.0 and on CRRT, pt will require significant repletion to improve levels to at least >2 with labs draws BID   NUTRITION DIAGNOSIS:   Inadequate oral intake related to inability to eat as evidenced by NPO status.  Continues  GOAL:   Patient will meet greater than or equal to 90% of their needs  Not Met, TF on hold  MONITOR:   Vent status, Labs, Weight trends, TF tolerance, Skin, I & O's  REASON FOR ASSESSMENT:   Consult Assessment of nutrition requirement/status  ASSESSMENT:   62 year old male with PMHx of CAD s/p CABG 2017 with AVR, HLD, HTN, prior CVA, T2DM, CKD 4 was previously on HD. Recent admission on 5/25 with AKI and AMS, had PEA arrest with ROSC in 8 minutes, required dialysis, also with MSSA bacteremia associated with tricuspid valve endocarditis, suffered right cerebellar CVA and had cholangitis/pancreatitis requiring ERCP. 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  9/10: admitted 9/11: echo showing aortic valve dehiscence, confirmed by TEE; CRRT started for volume overload and known CKD 4 9/13: to OR for redo of aortic valve, tricuspid valve repair and ascending aortic root replacement with re-implantation of SVG to RCA; could not come off CPB, cannulated and started on VA ECMO with central cannulation 9/16 TEE with EF 30%, moderate LVH, moderate RV dysfunction with  mild enlargement, no endocarditis 9/18 Bronch this AM, TF held due to bilious output from mouth reported.   Pt remains sedated on vent support, VA ECMO. Plan to return to OR for washout tomorrow. Noted possible need for conversion to VV ECMO when ready for VA decannulation  TF on hold with bile coming from patients mouth. Per RN, MD did not want to place OG/NG tube for decompression today due to high risk for bleeding.  Abd xray this AM with Cortrak "tip looped in stomach with tip at lumbosacral region". Initial abd xray with tip either in distal stomach or proximal duodenum.   No BM with last BM (small) on 9/12. Received sorbitol this AM, reglan added back to regimen  Central cannulation, chest remains open  WBC 52.6 but trending down. Lactic Acid up to 2.1 but already trending back down  Phosphorus critically low at <1.0 and on CRRT, pt will require significant repletion to improve levels to at least >2.   Labs: CBGs 155-228 (ICU goal 140-180), potassium 3.7 wdl, magnesium 2.4  Meds: reglan, sorbitol x 1, colace, miralax, dulcolax   Diet Order:   Diet Order     None       EDUCATION NEEDS:   No education needs have been identified at this time  Skin:  Skin Assessment: Skin Integrity Issues: Skin Integrity Issues:: Incisions, Other (Comment) Incisions: closed incision left leg Other: open chest  Last BM:  05/19/23 - small type 6  Height:   Ht Readings from Last 1 Encounters:  05/20/23 5\' 6"  (1.676 m)    Weight:   Wt Readings from Last 1  Encounters:  05/25/23 80.5 kg    Ideal Body Weight:  64.5 kg  BMI:  Body mass index is 28.64 kg/m.  Estimated Nutritional Needs:   Kcal:  2200-2400  Protein:  130-150 grams  Fluid:  1L plus UOP  Dwayne Starcher MS, RDN, LDN, CNSC Registered Dietitian 3 Clinical Nutrition RD Pager and On-Call Pager Number Located in [place]

## 2023-05-25 NOTE — Progress Notes (Signed)
NAME:  Beren Linnemann, MRN:  [id number], DOB:  1961/06/04, LOS: 7 ADMISSION DATE:  05/18/23, CONSULTATION DATE:  9/11 REFERRING MD:  Dr. Izora Ribas, CHIEF COMPLAINT:  aortic valve dehiscence   History of Present Illness:  Patient is [name] w/ pertinent PMH CAD s/p CABG 2017 w/ AVR (magna ease for bicuspid valve disease), HLD, HTN, prior CVA, T2DM, CKD4 was previously on HD presents to [facility] on 05-18-2023 chest pain.  Patient recently admitted to [facility] on 5/25 w/ AKI and AMS. While in ED patient had PEA arrest w/ ROSC in about 8 minutes. Patient required dialysis on this admission. Also w/ MSSA bacteremia associated w/ tricuspid valve endocarditis. Patient transferred to [facility] on 5/25. Treated w/ 6 week course oxacillin and rifampin. This admission also suffered a R cerebellar CVA w/ MRI likely septic emboli and had cholangitis/pancreatitis requiring ERCP.  On May 18, 2023 patient admitted to [facility] w/ chest pain, dyspnea, and BLE edema. BNP 2,097. CXR w/ pulm edema. Patient started on IV lasix infusion. Cards consulted. On 9/11 patient transferred to [facility]. Patient's echo showing possible aortic valve dehiscence. Cultures repeated and started on rocephin. Patient transferred to ICU for TEE and general anesthesia and to start CRRT. PCCM consulted.  Pertinent  Medical History   Past Medical History:  Diagnosis Date   Allergy    Anemia    Anxiety    Baker's cyst of knee    Blood transfusion without reported diagnosis    as baby    Coronary artery disease    quadruple bypass - March 2016   GERD (gastroesophageal reflux disease)    Gouty arthritis    "real bad" (01/17/2013)   Heart murmur    Hypercholesteremia    Hypertension    MSSA bacteremia 01/29/2023   Myocardial infarction (HCC) 2017   PEA (Pulseless electrical activity) (HCC) 01/29/2023   Stroke (HCC)    Type II diabetes mellitus (HCC)     Significant Hospital Events: Including procedures, antibiotic start and stop dates in  addition to other pertinent events   May 18, 2023 admitted 9/11 echo showing aortic valve dehiscence, confirmed by TEE.  9/11 started (back) on CRRT for volume overload and known CKD 4 9/12 breathing better after CRRT 9/13 to OR for Redo of aortic valve, tricuspid valve repair and ascending aortic root replacement w/ re-implantation of SVG to RCA. Post op TEE EF 55% RV mid-mod HK, AVR/TV repair stable. Could not come off CPB. Worse pulmonary edema. High dose pressors. Cannulated and started on VA ECMO w/ central cannulation  9/16 MDT ECMO rounds this AM, plans to remove more volume, 1 U PRBCs  9/18 bronch, clot and mucus plugs removed   Interim History / Subjective:   Patient remains critically ill, intubated on life support   Objective   Blood pressure 90/72, pulse 82, temperature 97.7 F (36.5 C), resp. rate 11, height 5\' 6"  (1.676 m), weight 80.5 kg, SpO2 99%. PAP: (20-27)/(13-21) 23/16 CVP:  [1 mmHg-9 mmHg] 3 mmHg  Vent Mode: PRVC FiO2 (%):  [50 %] 50 % Set Rate:  [15 bmp] 15 bmp Vt Set:  [400 mL] 400 mL PEEP:  [5 cmH20] 5 cmH20 Plateau Pressure:  [23 cmH20-27 cmH20] 27 cmH20   Intake/Output Summary (Last 24 hours) at 05/25/2023 0909 Last data filed at 05/25/2023 0800 Gross per 24 hour  Intake 5035.55 ml  Output 7070.2 ml  Net -2034.65 ml   Filed Weights   05/23/23 0500 05/24/23 0515 05/25/23 0530  Weight:  84.9 kg 84 kg 80.5 kg    Examination: General: Elderly gentleman, chronically ill-appearing, critically ill on mechanical support. HEENT: Endotracheal tube in place Heart: Patient has open chest, regular rate rhythm S1-S2 auscultated Lungs: Bilateral mechanically ventilated breath sounds, diminished in the bases Abdomen: Soft, nontender, mildly distended Extremities: Bilateral dependent edema in the flanks, legs are looking better  Chest x-ray reviewed, bilateral infiltrates, right consolidation worse.  Labs reviewed   Assessment & Plan:   Post cardiotomy  vasoplegia/cardioplegia w/ failure to wean from CPB Acute on chronic combined HF  Hx MSSA endocarditis S/p AVR/Bentall and reimplantation of Coronaries and TV repair  Acute respiratory failure w/ hypoxia due to volume overload, with untreated OSA CKD 4. Had recently been on HD Circuit related hemolysis L>R BLE swelling ->LE [place] neg for DVT CAD s/p CABG in 2017 w/ AVR HTN HLD Hx of CVA DMT2  Plan: Patient remains critically ill on ECMO support Rounded with multidisciplinary team. Decision made for bronchoscopy today.  Please see separate note. Volume removal with CVVHD. Remains on low-dose pressors. Discussed with thoracic surgery considerations for decannulation versus closure. At this point he is not tolerating decreased flows so he is still going to require VA ECMO support. Another option would be consider Impella placement plus VV ECMO access however he has a tunneled catheter on the right which makes considerations for catheter access and cannula access difficult. At this time plan is to go back to the operating room for washout tomorrow. Likely return continued on [place] ECMO support. Regarding switch from fentanyl to Dilaudid.   Best Practice (right click and "Reselect all SmartList Selections" daily)   Diet/type: Regular consistency (see orders) DVT prophylaxis: bival to start today GI prophylaxis: PPI Lines: Central line and Dialysis Catheter Foley: yes Code Status:  full code Last date of multidisciplinary goals of care discussion [mdt team discussions]  This patient is critically ill with multiple organ system failure; which, requires frequent high complexity decision making, assessment, support, evaluation, and titration of therapies. This was completed through the application of advanced monitoring technologies and extensive interpretation of multiple databases. During this encounter critical care time was devoted to patient care services described in this note for 33  minutes.   [name], DO [facility] 05/25/2023 9:09 AM

## 2023-05-25 NOTE — Progress Notes (Signed)
ANTICOAGULATION CONSULT NOTE - Follow up Consult  Pharmacy Consult for bivalirudin Indication:  VA ECMO  Allergies  Allergen Reactions   Other Anaphylaxis    Mushrooms  Not listed on MAR    Plavix [Clopidogrel Bisulfate] Other (See Comments)    TTP Not listed on the [facility]   Fleet Enema [Enema] Other (See Comments)    Unknown reaction   Lovenox [Enoxaparin] Other (See Comments)    Unknown reaction   Morphine Other (See Comments)    Unknown reaction   Nsaids Other (See Comments)    Unknown reaction   Hydrocodone-Acetaminophen Itching    Patient Measurements: Height: 5\' 6"  (167.6 cm) Weight: 80.5 kg (177 lb 7.5 oz) IBW/kg (Calculated) : 63.8 Heparin Dosing Weight: 81.3 kg  Vital Signs: Temp: 97.9 F (36.6 C) (09/18 1800) Temp Source: Core (09/18 1700) BP: 94/79 (09/18 1328) Pulse Rate: 91 (09/18 1800)  Labs: Recent Labs    05/23/23 0438 05/23/23 0439 05/24/23 0411 05/24/23 0413 05/24/23 1600 05/24/23 1602 05/24/23 2219 05/25/23 0009 05/25/23 0412 05/25/23 0421 05/25/23 1147 05/25/23 1240 05/25/23 1703  HGB 7.3*   < > 8.7*   < > 8.0*   < >  --    < > 9.0*   < > 10.2* 9.9* 9.3*  10.2*  HCT 21.8*   < > 25.3*   < > 24.3*   < >  --    < > 27.3*   < > 30.0* 29.0* 28.6*  30.0*  PLT 43*   < > 38*  --  33*  --   --   --  28*  --   --   --  24*  APTT 60*   < > 66*  --  76*  --  66*  --  59*  --   --   --  73*  LABPROT 21.5*  --  20.7*  --   --   --   --   --  19.5*  --   --   --   --   INR 1.8*  --  1.8*  --   --   --   --   --  1.6*  --   --   --   --   CREATININE 1.25*   < > 1.24  --  1.29*  --   --   --  1.19  --   --   --  1.14   < > = values in this interval not displayed.    Estimated Creatinine Clearance: 66.1 mL/min (by C-G formula based on SCr of 1.14 mg/dL).   Assessment: 62 yom underwent Bentall/AVR, TV repair, re-implantation of SVG-RCA complicated with vasoplegia and pulmonary edema requiring central VA ECMO. No AC PTA.  aPTT tonight came back  supratherapeutic at 73 sec. Hgb 9.3, plt 24 (continuing to trend down - will monitor closely). LDH down slightly to 1209. Fibrinogen >800. Remains on CRRT. No circuit issues - no fibrin in oxygenator. No s/sx of bleeding.   Goal of Therapy:  aPTT 50-70 seconds Monitor platelets by anticoagulation protocol: Yes   Plan:  Reduce bivalirudin at 0.015 mg/kg/hr  Monitor q12 hr aPTT and CBC, and for s/sx of bleeding   Thank you for allowing pharmacy to participate in this patient's care,  Sherron Monday, PharmD, BCCCP Clinical Pharmacist  Phone: [phone] 05/25/2023 6:30 PM  Please check AMION for all [facility] Pharmacy phone numbers After 10:00 PM, call Main Pharmacy [phone]

## 2023-05-25 NOTE — Progress Notes (Signed)
Tube feeds stopped due to yellow color substance removed from mouth with yankauer. MD Mclean made aware. Order placed and abdominal xray obtained.

## 2023-05-25 NOTE — Progress Notes (Signed)
Patient ID: [name], male   DOB: [birth year], 62 y.o.   MRN: [id number]   Extracorporeal support note   ECLS support day: Indication: Post-op vasoplegia and pulmonary edema  Configuration: Central [facility] ECMO  Drainage cannula: RA Return cannula: Ascending aorta  Pump speed: 2800 rpm  Pump flow: 3.0 L/min Pump used: Cardiohelp  Sweep gas: 5 with blender to lower PaO2  Circuit check: No fibrin Anticoagulant: Bivalirudin Anticoagulation targets: PTT 50-70  Changes in support: none  Anticipated goals/duration of support: Wean to decannulation  .[name]  05/25/2023, 8:25 AM

## 2023-05-25 NOTE — Progress Notes (Signed)
5 Days Post-Op Procedure(s) (LRB): REDO AORTIC VALVE REPLACEMENT (AVR) USING A 25 MM CRYO AORTIC VALVE CONDUIT (N/A) ASCENDING AORTIC ROOT REPLACEMENT (N/A) TRICUSPID VALVE REPAIR USING A 34 MM MC3 ANNULOPLASTY RING (N/A) TRANSESOPHAGEAL ECHOCARDIOGRAM (N/A) Subjective:  Labile hemodynamics overnight with bearing down on vent, probably vagal, with decreased flow. MAP trended down with volume removal and NE increased to 6. Epi on 4.5, vaso 0.03.  ECMO flow 3.0. PA 22/14 with CVP 2. Arterial waveform still very pulsatile.   -2026 cc/24 hrs. Wt down 7.5 lbs if accurate, probably not.  Abdomen distended and some yellowish secretions in mouth so tube feeds stopped. KUB this am shows tube looks like it is in duodenum to me.  Objective: Vital signs in last 24 hours: Temp:  [97.7 F (36.5 C)-98.1 F (36.7 C)] 97.7 F (36.5 C) (09/18 0615) Pulse Rate:  [71-102] 81 (09/18 0615) Cardiac Rhythm: Junctional rhythm (09/18 0400) Resp:  [0-19] 5 (09/18 0615) SpO2:  [97 %-100 %] 100 % (09/18 0615) Arterial Line BP: (71-89)/(61-74) 74/61 (09/18 0615) FiO2 (%):  [50 %] 50 % (09/18 0225) Weight:  [80.5 kg] 80.5 kg (09/18 0530)  Hemodynamic parameters for last 24 hours: PAP: (20-27)/(13-21) 22/14 CVP:  [1 mmHg-9 mmHg] 2 mmHg CO:  [2.6 L/min] 2.6 L/min CI:  [1.3 L/min/m2] 1.3 L/min/m2  Intake/Output from previous day: 09/17 0701 - 09/18 0700 In: 4989.5 [I.V.:2101.5; Blood:400; NG/GT:1747.2; IV Piggyback:740.9] Out: 7015.8 [Urine:10; Chest Tube:50] Intake/Output this shift: Total I/O In: 2764.3 [I.V.:1029.4; Blood:400; NG/GT:932.7; IV Piggyback:402.2] Out: 3503 [Urine:5; Chest Tube:40]  General appearance: intubated and sedated on vent Neurologic: unable to assess Heart: regular rate and rhythm, S1, S2 normal, no murmur Lungs: coarse bilat and right lower lobe sounds consolidated. Abdomen: soft, distended, no bowel sounds, does not appear tender.C Extremities: edema continues to  improve. Wound: chest dressing intact with no significant hematoma under Esmark. Minimal chest tube output.  Lab Results: Recent Labs    05/24/23 1600 05/24/23 1602 05/25/23 0412 05/25/23 0421  WBC 48.4*  --  44.9*  --   HGB 8.0*   < > 9.0* 9.5*  HCT 24.3*   < > 27.3* 28.0*  PLT 33*  --  28*  --    < > = values in this interval not displayed.   BMET:  Recent Labs    05/24/23 1600 05/24/23 1602 05/25/23 0412 05/25/23 0421  NA 136   < > 138 139  K 3.5   < > 3.4* 3.4*  CL 105  --  105  --   CO2 25  --  23  --   GLUCOSE 154*  --  225*  --   BUN 14  --  24*  --   CREATININE 1.29*  --  1.19  --   CALCIUM 7.8*  --  8.1*  --    < > = values in this interval not displayed.    PT/INR:  Recent Labs    05/25/23 0412  LABPROT 19.5*  INR 1.6*   ABG    Component Value Date/Time   PHART 7.373 05/25/2023 0421   HCO3 23.9 05/25/2023 0421   TCO2 25 05/25/2023 0421   ACIDBASEDEF 1.0 05/25/2023 0421   O2SAT 99 05/25/2023 0421   CBG (last 3)  Recent Labs    05/24/23 1954 05/25/23 0002 05/25/23 0418  GLUCAP 187* 225* 228*   CXR: bilateral diffuse air space disease worse on right.  Assessment/Plan: S/P Procedure(s) (LRB): REDO AORTIC VALVE REPLACEMENT (AVR) USING A 25  MM CRYO AORTIC VALVE CONDUIT (N/A) ASCENDING AORTIC ROOT REPLACEMENT (N/A) TRICUSPID VALVE REPAIR USING A 34 MM MC3 ANNULOPLASTY RING (N/A) TRANSESOPHAGEAL ECHOCARDIOGRAM (N/A)  POD 5  Hemodynamics still fairly stable but has required a little higher vasopressor dose with more volume removal.  Continue to remove volume as tolerated. May need to slow down.  Thrombocytopenia: observe since not bleeding.  Leukocytosis improved.  Continue broad spectrum antibiotics. Preop BC and operative cultures negative.  Tentatively planning attempt at [facility] ECMO decannulation in OR tomorrow and chest closure. I think he will like need conversion to VV ECMO with appearance of lungs on CXR.   I will call his son or  daughter today to discuss the procedure.   LOS: 7 days    Alleen Borne 05/25/2023

## 2023-05-25 NOTE — Inpatient Diabetes Management (Signed)
Inpatient Diabetes Program Recommendations  AACE/ADA: New Consensus Statement on Inpatient Glycemic Control (2015)  Target Ranges:  Prepandial:   less than 140 mg/dL      Peak postprandial:   less than 180 mg/dL (1-2 hours)      Critically ill patients:  140 - 180 mg/dL   Lab Results  Component Value Date   GLUCAP 209 (H) 05/25/2023   HGBA1C 6.1 (H) 05/20/2023    Review of Glycemic Control  Latest Reference Range & Units 05/24/23 11:58 05/24/23 15:59 05/24/23 19:54 05/25/23 00:02 05/25/23 04:18 05/25/23 08:33  Glucose-Capillary 70 - 99 mg/dL 725 (H) 366 (H) 440 (H) 225 (H) 228 (H) 209 (H)   Diabetes history: DM 2 Outpatient Diabetes medications:  Farxiga 10 mg daily Current orders for Inpatient glycemic control:  Novolog moderate q 4 hours Pivot 1.5 ml- Goal 60 ml/hr Inpatient Diabetes Program Recommendations:    Note CBG's increased overnight.  May consider adding Novolog meal coverage 2 units q 4 hours to cover CHO in tube feeds.   Thanks,  Beryl Meager, RN, BC-ADM Inpatient Diabetes Coordinator Pager [phone]  (8a-5p)

## 2023-05-25 NOTE — Progress Notes (Signed)
Patient ID: [name], male   DOB: Dec 21, 1960, 62 y.o.   MRN: [id number]     Advanced Heart Failure Rounding Note  PCP-Cardiologist: Kristeen Miss, MD   Subjective:    June 09, 2023: OR for Bentall/AVR, TV repair, re-implantation of SVG-RCA.  Post-op vasoplegia and pulmonary edema, required central VA ECMO (RA drainage, ascending aorta return.  Post-op TEE EF 55%, mild-moderate RV hypokinesis.  9/14: Blender added to circuit due to high PaO2 9/16: TEE with EF 30%, moderate LVH, moderate RV dysfunction with mild enlargement, stable bioprosthetic aortic valve, repaired TV with mild TR, no endocarditis noted.   MAP 70 on epinephrine 4.5, NE 8,  and vasopressin 0.03 (higher NE).  CVVH 100 cc/hr net negative UF, net negative 2165, weight significantly.  CXR with some clearing of left lung, right lung still with prominent airspace disease without changes.  Tube feeds stopped as tube feed contents found in mouth this morning.      H/o MSSA endocarditis, currently on meropenem/vancomycin. WBCs 54 => 53 => 45K.   Hgb 9, plts 43 => 38 => 28  Swan: CVP 6-7 PA 22/14  Rhythm = junctional  VA ECMO 2800 rpm Flow 3 L/min pVen -17 Delta P 22 Sweep 5 LDH 775 => 1020 => 1431=> 1353 => 1209 Lactate 1.3 => 1.5 => 1.4 => 1.1 => 2.1 ABG 7.37/41/124   Objective:   Weight Range: 80.5 kg Body mass index is 28.64 kg/m.   Vital Signs:   Temp:  [97.7 F (36.5 C)-98.1 F (36.7 C)] 97.7 F (36.5 C) (09/18 0800) Pulse Rate:  [71-102] 82 (09/18 0800) Resp:  [0-19] 11 (09/18 0800) BP: (90)/(72) 90/72 (09/18 0754) SpO2:  [97 %-100 %] 99 % (09/18 0800) Arterial Line BP: (71-89)/(60-74) 76/63 (09/18 0800) FiO2 (%):  [50 %] 50 % (09/18 0754) Weight:  [80.5 kg] 80.5 kg (09/18 0530) Last BM Date : 05/19/23  Weight change: Filed Weights   05/23/23 0500 05/24/23 0515 05/25/23 0530  Weight: 84.9 kg 84 kg 80.5 kg    Intake/Output:   Intake/Output Summary (Last 24 hours) at 05/25/2023 0830 Last data  filed at 05/25/2023 0800 Gross per 24 hour  Intake 5182.33 ml  Output 7385.3 ml  Net -2202.97 ml      Physical Exam    General: Sedated on vent Neck: No JVD, no thyromegaly or thyroid nodule.  Lungs: Clear to auscultation bilaterally with normal respiratory effort. CV: Chest wall open with central VA ECMO  Abdomen: Soft, nontender, no hepatosplenomegaly, no distention.  Skin: Intact without lesions or rashes.  Neurologic: Sedated on vent Extremities: No clubbing or cyanosis.  HEENT: Normal.    Telemetry   Junctional rhythm 70s (personally reviewed)   Labs    CBC Recent Labs    05/24/23 1600 05/24/23 1602 05/25/23 0412 05/25/23 0421  WBC 48.4*  --  44.9*  --   HGB 8.0*   < > 9.0* 9.5*  HCT 24.3*   < > 27.3* 28.0*  MCV 90.0  --  89.2  --   PLT 33*  --  28*  --    < > = values in this interval not displayed.   Basic Metabolic Panel Recent Labs    40/98/11 1600 05/24/23 1602 05/25/23 0412 05/25/23 0421  NA 136   < > 138 139  K 3.5   < > 3.4* 3.4*  CL 105  --  105  --   CO2 25  --  23  --   GLUCOSE 154*  --  225*  --   BUN 14  --  24*  --   CREATININE 1.29*  --  1.19  --   CALCIUM 7.8*  --  8.1*  --   MG 2.5*  --  2.4  --   PHOS 1.7*  --  <1.0*  --    < > = values in this interval not displayed.   Liver Function Tests Recent Labs    05/24/23 0411 05/24/23 1600 05/25/23 0412  AST 68*  --  48*  ALT 11  --  9  ALKPHOS 86  --  115  BILITOT 1.5*  --  1.2  PROT 6.0*  --  5.9*  ALBUMIN 2.4*  2.4* 2.3* 2.2*  2.2*   No results for input(s): "LIPASE", "AMYLASE" in the last 72 hours. Cardiac Enzymes No results for input(s): "CKTOTAL", "CKMB", "CKMBINDEX", "TROPONINI" in the last 72 hours.  BNP: BNP (last 3 results) Recent Labs    06/03/2023 1100  BNP 2,097.0*    ProBNP (last 3 results) No results for input(s): "PROBNP" in the last 8760 hours.   D-Dimer No results for input(s): "DDIMER" in the last 72 hours. Hemoglobin A1C No results for  input(s): "HGBA1C" in the last 72 hours.  Fasting Lipid Panel No results for input(s): "CHOL", "HDL", "LDLCALC", "TRIG", "CHOLHDL", "LDLDIRECT" in the last 72 hours. Thyroid Function Tests No results for input(s): "TSH", "T4TOTAL", "T3FREE", "THYROIDAB" in the last 72 hours.  Invalid input(s): "FREET3"  Other results:   Imaging    No results found.   Medications:     Scheduled Medications:  acetaminophen  1,000 mg Oral Q6H   Or   acetaminophen (TYLENOL) oral liquid 160 mg/5 mL  1,000 mg Per Tube Q6H   atorvastatin  80 mg Per Tube q1800   bisacodyl  10 mg Oral Daily   Or   bisacodyl  10 mg Rectal Daily   Chlorhexidine Gluconate Cloth  6 each Topical Daily   docusate  100 mg Per Tube BID   ezetimibe  10 mg Per Tube Daily   feeding supplement (PROSource TF20)  60 mL Per Tube Q4H    HYDROmorphone (DILAUDID) injection  1 mg Intravenous Once   insulin aspart  0-15 Units Subcutaneous Q4H   metoCLOPramide  5 mg Per Tube Q6H   multivitamin  1 tablet Per Tube BID   mouth rinse  15 mL Mouth Rinse Q2H   pantoprazole (PROTONIX) IV  40 mg Intravenous QHS   polyethylene glycol  17 g Per Tube BID   sodium chloride flush  3 mL Intravenous Q12H   sorbitol  60 mL Per Tube Once    Infusions:   prismasol BGK 4/2.5 400 mL/hr at 05/25/23 0319    prismasol BGK 4/2.5 400 mL/hr at 05/25/23 0320   sodium chloride Stopped (05/24/23 0032)   sodium chloride Stopped (05/22/23 1310)   sodium chloride 10 mL/hr at 05/25/23 0800   anticoagulant sodium citrate     bivalirudin (ANGIOMAX) 250 mg in sodium chloride 0.9 % 500 mL (0.5 mg/mL) infusion 0.017 mg/kg/hr (05/25/23 0800)   dexmedetomidine (PRECEDEX) IV infusion 0.7 mcg/kg/hr (05/25/23 0800)   epinephrine 5 mcg/min (05/25/23 0800)   feeding supplement (PIVOT 1.5 CAL) Stopped (05/25/23 0545)   HYDROmorphone     insulin Stopped (05/21/23 0754)   lactated ringers 10 mL/hr at 05/24/23 0532   meropenem (MERREM) IV Stopped (05/25/23 0546)    midazolam 10 mg/hr (05/25/23 0800)   norepinephrine (LEVOPHED) Adult infusion 6 mcg/min (05/25/23 0800)  potassium PHOSPHATE IVPB (in mmol) 85 mL/hr at 05/25/23 0800   prismasol BGK 4/2.5 1,500 mL/hr at 05/25/23 0354   vancomycin Stopped (05/24/23 2245)   vasopressin 0.03 Units/min (05/25/23 0800)    PRN Medications: sodium chloride, sodium chloride, anticoagulant sodium citrate, artificial tears, dextrose, fentaNYL, HYDROmorphone, midazolam, midazolam, morphine injection, ondansetron (ZOFRAN) IV, mouth rinse, oxyCODONE, sodium chloride flush, traMADol    Assessment/Plan   1. Post-cardiotomy vasoplegia/shock with failure to wean from CPB: Now on central VA ECMO (05/18/2023) with RA drainage/ascending aorta return. Post-op echo with EF 55%, mild-moderate RV dysfunction. Unable to get images yesterday by TTE.  TEE on 9/16 showed EF 30%, moderate LVH, moderate RV dysfunction with mild enlargement, stable bioprosthetic aortic valve, repaired TV with mild TR, no endocarditis noted.  MAP around 70 now on epinephrine 5 + NE 8 + vasopressin 0.03, pulsatile arterial line.  CXR with clearing left lung, right lung with persistent airspace disease not clearing with CVVH.  I/Os net negative -2165 via CVVH and weight down.  CVP 6-7.  I decreased LVAD flow to 2.5 L/min then 2 L/min today, patient promptly became significantly hypotensive and we increased back to 3 L/min.  - Run CVVH even to slightly negative today, I think we have effectively diuresed him.  - Wean pressors as able.   - Continue bivalirudin today with goal PTT 50-70.  - Discussed with Drs Laneta Simmers and Icard, he is not ready to make transition from [facility] ECMO to VV ECMO alone.  He did not tolerate ECMO flow weaning today below 3L/min.  We considered placement of Impella 5.5 with eventual transition to VV ECMO but right-sided access would be difficult given his permanent dialysis tunneled catheter in the right chest and left subclavian Impella 5.5 access  would prevent use of the left subclavian vein for VV ECMO.  For now, plan will be to OR for washout tomorrow and TEE evaluation, probably leaving VA ECMO central cannulation.  2. Acute hypoxic respiratory failure, post-op: On vent, rest settings. Blender added to keep PaO2 down, ABG slightly more acidotic today and sweep up to 5. CXR with some clearing of left lung, right lung with ongoing diffuse airspace disease without change.  Suspect extensive PNA, think we have done what we can with CVVH for lung clearing at this point with CVP 6-7 and weight down.  - Keep CVVH even to slightly negative today.  - Discuss bronchoscopy with CCM.   - Meropenem/vancomycin for coverage of PNA 3. Severe prosthetic AI: Due to previous MSSA endocarditis with partial valve dehiscence s/p re-do AVR/Bentall with reimplantation of SVG-RCA and TV repair 05/30/2023.   - Continuing meropenem/vancomycin.  4. CAD s/p CABG/AVR 2017:  s/p AVR/Bentall and re-implantation of SVG-RCA 05/24/2023. - Off ASA with platelets down.  5. ESRD: CVVH as above.  6. DM2: management per CCM 7. Heart block: Post-op, now in stable junctional rhythm.   8. H/o CVA: Prior cerebellar CVA.  9. Thrombocytopenia: Post-op, 43 => 38 => 28 K today.  Suspect inflammatory.  - Will need platelet transfusion with OR washout tomorrow.  10. Anemia: Post-op, transfuse hgb < 8. 9 today (improved).  11. ID: H/o MSSA bacteremia.  WBCs 54 => 53 => 45K now.  - Meropenem + vancomycin, antibiotics broadened yesterday.   CRITICAL CARE Performed by: [name]  Total critical care time: 45 minutes  Critical care time was exclusive of separately billable procedures and treating other patients.  Critical care was necessary to treat or prevent imminent or  life-threatening deterioration.  Critical care was time spent personally by me on the following activities: development of treatment plan with patient and/or surrogate as well as nursing, discussions with  consultants, evaluation of patient's response to treatment, examination of patient, obtaining history from patient or surrogate, ordering and performing treatments and interventions, ordering and review of laboratory studies, ordering and review of radiographic studies, pulse oximetry and re-evaluation of patient's condition.    Length of Stay: 7  [name], MD  05/25/2023, 8:30 AM  Advanced Heart Failure Team Pager [phone] (M-F; 7a - 5p)  Please contact [facility] for night-coverage after hours (5p -7a ) and weekends on [email or web address]

## 2023-05-25 NOTE — H&P (View-Only) (Signed)
5 Days Post-Op Procedure(s) (LRB): REDO AORTIC VALVE REPLACEMENT (AVR) USING A 25 MM CRYO AORTIC VALVE CONDUIT (N/A) ASCENDING AORTIC ROOT REPLACEMENT (N/A) TRICUSPID VALVE REPAIR USING A 34 MM MC3 ANNULOPLASTY RING (N/A) TRANSESOPHAGEAL ECHOCARDIOGRAM (N/A) Subjective:  Labile hemodynamics overnight with bearing down on vent, probably vagal, with decreased flow. MAP trended down with volume removal and NE increased to 6. Epi on 4.5, vaso 0.03.  ECMO flow 3.0. PA 22/14 with CVP 2. Arterial waveform still very pulsatile.   -2026 cc/24 hrs. Wt down 7.5 lbs if accurate, probably not.  Abdomen distended and some yellowish secretions in mouth so tube feeds stopped. KUB this am shows tube looks like it is in duodenum to me.  Objective: Vital signs in last 24 hours: Temp:  [97.7 F (36.5 C)-98.1 F (36.7 C)] 97.7 F (36.5 C) (09/18 0615) Pulse Rate:  [71-102] 81 (09/18 0615) Cardiac Rhythm: Junctional rhythm (09/18 0400) Resp:  [0-19] 5 (09/18 0615) SpO2:  [97 %-100 %] 100 % (09/18 0615) Arterial Line BP: (71-89)/(61-74) 74/61 (09/18 0615) FiO2 (%):  [50 %] 50 % (09/18 0225) Weight:  [80.5 kg] 80.5 kg (09/18 0530)  Hemodynamic parameters for last 24 hours: PAP: (20-27)/(13-21) 22/14 CVP:  [1 mmHg-9 mmHg] 2 mmHg CO:  [2.6 L/min] 2.6 L/min CI:  [1.3 L/min/m2] 1.3 L/min/m2  Intake/Output from previous day: 09/17 0701 - 09/18 0700 In: 4989.5 [I.V.:2101.5; Blood:400; NG/GT:1747.2; IV Piggyback:740.9] Out: 7015.8 [Urine:10; Chest Tube:50] Intake/Output this shift: Total I/O In: 2764.3 [I.V.:1029.4; Blood:400; NG/GT:932.7; IV Piggyback:402.2] Out: 3503 [Urine:5; Chest Tube:40]  General appearance: intubated and sedated on vent Neurologic: unable to assess Heart: regular rate and rhythm, S1, S2 normal, no murmur Lungs: coarse bilat and right lower lobe sounds consolidated. Abdomen: soft, distended, no bowel sounds, does not appear tender.C Extremities: edema continues to  improve. Wound: chest dressing intact with no significant hematoma under Esmark. Minimal chest tube output.  Lab Results: Recent Labs    05/24/23 1600 05/24/23 1602 05/25/23 0412 05/25/23 0421  WBC 48.4*  --  44.9*  --   HGB 8.0*   < > 9.0* 9.5*  HCT 24.3*   < > 27.3* 28.0*  PLT 33*  --  28*  --    < > = values in this interval not displayed.   BMET:  Recent Labs    05/24/23 1600 05/24/23 1602 05/25/23 0412 05/25/23 0421  NA 136   < > 138 139  K 3.5   < > 3.4* 3.4*  CL 105  --  105  --   CO2 25  --  23  --   GLUCOSE 154*  --  225*  --   BUN 14  --  24*  --   CREATININE 1.29*  --  1.19  --   CALCIUM 7.8*  --  8.1*  --    < > = values in this interval not displayed.    PT/INR:  Recent Labs    05/25/23 0412  LABPROT 19.5*  INR 1.6*   ABG    Component Value Date/Time   PHART 7.373 05/25/2023 0421   HCO3 23.9 05/25/2023 0421   TCO2 25 05/25/2023 0421   ACIDBASEDEF 1.0 05/25/2023 0421   O2SAT 99 05/25/2023 0421   CBG (last 3)  Recent Labs    05/24/23 1954 05/25/23 0002 05/25/23 0418  GLUCAP 187* 225* 228*   CXR: bilateral diffuse air space disease worse on right.  Assessment/Plan: S/P Procedure(s) (LRB): REDO AORTIC VALVE REPLACEMENT (AVR) USING A 25  MM CRYO AORTIC VALVE CONDUIT (N/A) ASCENDING AORTIC ROOT REPLACEMENT (N/A) TRICUSPID VALVE REPAIR USING A 34 MM MC3 ANNULOPLASTY RING (N/A) TRANSESOPHAGEAL ECHOCARDIOGRAM (N/A)  POD 5  Hemodynamics still fairly stable but has required a little higher vasopressor dose with more volume removal.  Continue to remove volume as tolerated. May need to slow down.  Thrombocytopenia: observe since not bleeding.  Leukocytosis improved.  Continue broad spectrum antibiotics. Preop BC and operative cultures negative.  Tentatively planning attempt at [facility] ECMO decannulation in OR tomorrow and chest closure. I think he will like need conversion to VV ECMO with appearance of lungs on CXR.   I will call his son or  daughter today to discuss the procedure.   LOS: 7 days    Alleen Borne 05/25/2023

## 2023-05-25 NOTE — Progress Notes (Signed)
[facility] NEPHROLOGY PROGRESS NOTE  Assessment/ Plan: 63M recently recovered GFR from dialysis dependent AKI with dehiscence  of prosthetic aortic value and severe AI/TR.   # Anuric AKI/dialysis dependent AKI with fluid overload: Started on RRT during previous admission in May 2024 until 05/10/23 (recovered). Patient is status post cardiac surgery on 9/13 and currently on ECMO. CRRT start 9/11. He remains anuric with fluid overload.  Continue to run CRRT at current prescription.  UF as tolerated by BP and ECMO, Angiomax for anticoagulation.  UFG: net even to net neg 50cc/hr. Discussed with ICU RN at bedside.  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  Per CTS  # AHRF: VDRF, per primary service. Abx per primary service to cover for PNA  # Postcardiotomy vasoplegia/shock with failure to wean: Currently on ECMO per CHF team.  On pressors per primary service.  Volume managing with CRRT. Possible chest closure and VA ecmo decann tomorrow, will likely need to covert VV ecmo  # Hyperkalemia: Managed with CRRT prescription and follow labs. K stable today  # Anemia: Transfuse as needed per primary team.  Discussed with ICU RN.  Subjective: Seen and examined on CRRT.  Currently on ECMO, CRRT and multiple lines.  Aiming for net even to net neg 50cc/hr Discussed with RN Pressors: epi, NE, vaso  Objective Vital signs in last 24 hours: Vitals:   05/25/23 0645 05/25/23 0700 05/25/23 0754 05/25/23 0800  BP:   90/72   Pulse: 82 82 81 82  Resp: (!) 7 15 12 11   Temp: 97.7 F (36.5 C) 97.7 F (36.5 C) 97.7 F (36.5 C) 97.7 F (36.5 C)  TempSrc:      SpO2: 100% 100% 100% 99%  Weight:      Height:       Weight change: -3.5 kg  Intake/Output Summary (Last 24 hours) at 05/25/2023 0821 Last data filed at 05/25/2023 0800 Gross per 24 hour  Intake 5182.33 ml  Output 7385.3 ml  Net -2202.97 ml        Labs: RENAL PANEL Recent Labs  Lab 05/23/23 0438 05/23/23 0439 05/23/23 0945 05/23/23 1620 05/23/23 1725 05/24/23 0411 05/24/23 0413 05/24/23 1600 05/24/23 1602 05/24/23 1956 05/25/23 0009 05/25/23 0412 05/25/23 0421  NA 135   < >  --  137   < > 134*   < > 136 137 139 139 138 139  K 3.4*   < >  --  3.9   < > 3.8   < > 3.5 3.6 3.8 3.3* 3.4* 3.4*  CL 102  --   --  101  --  103  --  105  --   --   --  105  --   CO2 23  --   --  23  --  22  --  25  --   --   --  23  --   GLUCOSE 114*  --   --  108*  --  115*  --  154*  --   --   --  225*  --   BUN 13  --   --  12  --  11  --  14  --   --   --  24*  --   CREATININE 1.25*  --   --  1.26*  --  1.24  --  1.29*  --   --   --  1.19  --   CALCIUM 7.9*  --   --  8.0*  --  8.1*  --  7.8*  --   --   --  8.1*  --   MG  --   --  2.3 2.4  --  2.4  --  2.5*  --   --   --  2.4  --   PHOS 2.3*  --  2.4* 2.0*  2.1*  --  1.7*  --  1.7*  --   --   --  <1.0*  --   ALBUMIN 2.4*  2.4*  --   --  2.4*  --  2.4*  2.4*  --  2.3*  --   --   --  2.2*  2.2*  --    < > = values in this interval not displayed.    Liver Function Tests: Recent Labs  Lab 05/23/23 0438 05/23/23 1620 05/24/23 0411 05/24/23 1600 05/25/23 0412  AST 82*  --  68*  --  48*  ALT 12  --  11  --  9  ALKPHOS 73  --  86  --  115  BILITOT 1.2  --  1.5*  --  1.2  PROT 5.5*  --  6.0*  --  5.9*  ALBUMIN 2.4*  2.4*   < > 2.4*  2.4* 2.3* 2.2*  2.2*   < > = values in this interval not displayed.   No results for input(s): "LIPASE", "AMYLASE" in the last 168 hours. No results for input(s): "AMMONIA" in the last 168 hours. CBC: Recent Labs    08/12/22 0934 01/28/23 2355 05/22/2023 2116 05/18/23 0411 05/19/23 0636 05/14/2023 0309 05/24/23 1600 05/24/23 1602 05/24/23 1956 05/25/23 0009 05/25/23 0412 05/25/23 0421  HGB 10.2*   < > 7.6*   < > 8.1*   < > 8.0* 8.8* 8.2* 9.2* 9.0* 9.5*  MCV 94   < >  --    < > 89.3   < > 90.0  --   --   --  89.2  --   VITAMINB12  --    --   --   --  701  --   --   --   --   --   --   --   FERRITIN 537*  --  2,200*  --   --   --   --   --   --   --   --   --   TIBC 264  --  204*  --   --   --   --   --   --   --   --   --   IRON 83  --  140  --   --   --   --   --   --   --   --   --    < > = values in this interval not displayed.    Cardiac Enzymes: No results for input(s): "CKTOTAL", "CKMB", "CKMBINDEX", "TROPONINI" in the last 168 hours. CBG: Recent Labs  Lab 05/24/23 1158 05/24/23 1559 05/24/23 1954 05/25/23 0002 05/25/23 0418  GLUCAP 155* 157* 187* 225* 228*    Iron Studies: No results for input(s): "IRON", "TIBC", "TRANSFERRIN", "FERRITIN" in the last 72 hours. Studies/Results: DG CHEST PORT 1 VIEW  Result Date: 05/24/2023 CLINICAL DATA:  ECMO. EXAM: PORTABLE CHEST 1 VIEW COMPARISON:  Radiograph yesterday. FINDINGS: The endotracheal tube appears borderline low in positioning, approximately 17 mm from  the carina. The remaining support apparatus is unchanged in positioning. Right internal jugular dialysis catheter, left Swan-Ganz catheter, multiple ECMO catheters and right-sided chest tube persist. Weighted enteric tube tip below the diaphragm not included in the field of view. Stable heart size and mediastinal contours. Persistent bilateral lung opacities without significant change. No visualized pneumothorax. IMPRESSION: 1. The endotracheal tube appears borderline low in positioning, approximately 17 mm from the carina. Recommend retraction of 1 cm. 2. Remaining support apparatus is unchanged in positioning. 3. Persistent bilateral lung opacities without significant change. Electronically Signed   By: Narda Rutherford M.D.   On: 05/24/2023 10:58   ECHO TEE  Result Date: 05/23/2023    TRANSESOPHOGEAL ECHO REPORT   Patient Name:   [name] Date of Exam: 05/23/2023 Medical Rec #:  [id number]     Height:       66.0 in Accession #:    [id number]    Weight:       187.2 lb Date of Birth:  Jan 21, 1961      BSA:           1.944 m Patient Age:    62 years      BP:           0/0 mmHg Patient Gender: M             HR:           59 bpm. Exam Location:  Inpatient Procedure: Transesophageal Echo, Color Doppler and Cardiac Doppler Indications:    CHF  History:        Patient has prior history of Echocardiogram examinations, most                 recent 05/18/2023. Previous Myocardial Infarction and CAD,                 Stroke, TV repair; Risk Factors:Diabetes and Hypertension.                 Aortic Valve: 25 mm Cryo AV conduit/AA replacement valve is                 present in the aortic position. Procedure Date: 06/03/2023.  Sonographer:    Milda Smart Referring Phys: [id number] DALTON S MCLEAN PROCEDURE: After discussion of the risks and benefits of a TEE, an informed consent was obtained from the patient. The patient was intubated. TEE procedure time was 15 minutes. The transesophogeal probe was passed without difficulty through the esophogus  of the patient. Imaged were obtained with the patient in a supine position. Sedation performed by performing physician. Patients was under conscious sedation during this procedure. Image quality was adequate. The patient's vital signs; including heart rate, blood pressure, and oxygen saturation; remained stable throughout the procedure. The patient developed no complications during the procedure.  IMPRESSIONS  1. Left ventricular ejection fraction, by estimation, is 30%. The left ventricle has moderate to severely decreased function. The left ventricle demonstrates global hypokinesis. There is moderate left ventricular hypertrophy.  2. Right ventricular systolic function is moderately reduced. The right ventricular size is mildly enlarged. Moderately increased right ventricular wall thickness.  3. The tricuspid valve is has been repaired. There is mild tricuspid regurgitation.  4. Bioprosthetic aortic valve. Aortic valve regurgitation is trivial. No aortic stenosis is present.  5. The mitral valve is  normal in structure. Trivial mitral valve regurgitation. No evidence of mitral stenosis.  6. No left atrial/left atrial appendage thrombus was detected.  7. No PFO/ASD  by color doppler.  8. Aortic root/ascending aorta has been replaced, s/p Bentall. FINDINGS  Left Ventricle: Left ventricular ejection fraction, by estimation, is 30%. The left ventricle has moderate to severely decreased function. The left ventricle demonstrates global hypokinesis. The left ventricular internal cavity size was normal in size. There is moderate left ventricular hypertrophy. Right Ventricle: The right ventricular size is mildly enlarged. Moderately increased right ventricular wall thickness. Right ventricular systolic function is moderately reduced. Left Atrium: Left atrial size was normal in size. No left atrial/left atrial appendage thrombus was detected. Right Atrium: Right atrial size was normal in size. Pericardium: Trivial pericardial effusion is present. Mitral Valve: The mitral valve is normal in structure. Trivial mitral valve regurgitation. No evidence of mitral valve stenosis. Tricuspid Valve: The tricuspid valve is has been repaired/replaced. Tricuspid valve regurgitation is mild. Aortic Valve: The aortic valve has been repaired/replaced. Aortic valve regurgitation is trivial. No aortic stenosis is present. There is a 25 mm Cryo AV conduit/AA replacement valve present in the aortic position. Procedure Date: 07/03/2023. Pulmonic Valve: The pulmonic valve was normal in structure. Pulmonic valve regurgitation is not visualized. Aorta: The aortic root/ascending aorta has been repaired/replaced. IAS/Shunts: No PFO/ASD by color doppler. Additional Comments: Spectral Doppler performed. TRICUSPID VALVE TR Peak grad:   26.4 mmHg TR Vmax:        257.00 cm/s Dalton McleanMD Electronically signed by Wilfred Lacy Signature Date/Time: 05/23/2023/4:07:34 PM    Final    DG Abd Portable 1V  Result Date: 05/23/2023 CLINICAL DATA:  Feeding  tube placement. EXAM: PORTABLE ABDOMEN - 1 VIEW COMPARISON:  None Available. FINDINGS: Tip of the weighted enteric tube in the right upper quadrant in the region of the distal stomach or proximal duodenum. Portions of additional support apparatus are partially included in the field of view. Relative paucity bowel gas in the included abdomen. IMPRESSION: Tip of the weighted enteric tube in the right upper quadrant in the region of the distal stomach or proximal duodenum. Electronically Signed   By: Narda Rutherford M.D.   On: 05/23/2023 15:05    Medications: Infusions:   prismasol BGK 4/2.5 400 mL/hr at 05/25/23 0319    prismasol BGK 4/2.5 400 mL/hr at 05/25/23 0320   sodium chloride Stopped (05/24/23 0032)   sodium chloride Stopped (05/22/23 1310)   sodium chloride 10 mL/hr at 05/25/23 0800   anticoagulant sodium citrate     bivalirudin (ANGIOMAX) 250 mg in sodium chloride 0.9 % 500 mL (0.5 mg/mL) infusion 0.017 mg/kg/hr (05/25/23 0800)   dexmedetomidine (PRECEDEX) IV infusion 0.7 mcg/kg/hr (05/25/23 0800)   epinephrine 5 mcg/min (05/25/23 0800)   feeding supplement (PIVOT 1.5 CAL) Stopped (05/25/23 0545)   HYDROmorphone     insulin Stopped (05/21/23 0754)   lactated ringers 10 mL/hr at 05/24/23 0532   meropenem (MERREM) IV Stopped (05/25/23 0546)   midazolam 10 mg/hr (05/25/23 0800)   norepinephrine (LEVOPHED) Adult infusion 6 mcg/min (05/25/23 0800)   potassium PHOSPHATE IVPB (in mmol) 85 mL/hr at 05/25/23 0800   prismasol BGK 4/2.5 1,500 mL/hr at 05/25/23 0354   vancomycin Stopped (05/24/23 2245)   vasopressin 0.03 Units/min (05/25/23 0800)    Scheduled Medications:  acetaminophen  1,000 mg Oral Q6H   Or   acetaminophen (TYLENOL) oral liquid 160 mg/5 mL  1,000 mg Per Tube Q6H   atorvastatin  80 mg Per Tube q1800   bisacodyl  10 mg Oral Daily   Or   bisacodyl  10 mg Rectal Daily   Chlorhexidine Gluconate  Cloth  6 each Topical Daily   docusate  100 mg Per Tube BID   ezetimibe  10  mg Per Tube Daily   feeding supplement (PROSource TF20)  60 mL Per Tube Q4H    HYDROmorphone (DILAUDID) injection  1 mg Intravenous Once   insulin aspart  0-15 Units Subcutaneous Q4H   metoCLOPramide  5 mg Per Tube Q6H   multivitamin  1 tablet Per Tube BID   mouth rinse  15 mL Mouth Rinse Q2H   pantoprazole (PROTONIX) IV  40 mg Intravenous QHS   polyethylene glycol  17 g Per Tube BID   sodium chloride flush  3 mL Intravenous Q12H   sorbitol  60 mL Per Tube Once    have reviewed scheduled and prn medications.  Physical Exam: General: Critically ill looking male, intubated, sedated, multiple lines Heart: S1S2 Lungs: Multiple lines and ECMO in the chest, diminished breath sounds bibasilar, intubated Abdomen:soft,  non-distended Extremities: no sig edema b/l Les Neuro: sedated Dialysis Access: LIJ HD line   [name] 05/25/2023,8:21 AM  LOS: 7 days

## 2023-05-25 NOTE — Progress Notes (Signed)
ANTICOAGULATION CONSULT NOTE - Follow up Consult  Pharmacy Consult for bivalirudin Indication:  VA ECMO  Allergies  Allergen Reactions   Other Anaphylaxis    Mushrooms  Not listed on MAR    Plavix [Clopidogrel Bisulfate] Other (See Comments)    TTP Not listed on the [facility]   Fleet Enema [Enema] Other (See Comments)    Unknown reaction   Lovenox [Enoxaparin] Other (See Comments)    Unknown reaction   Morphine Other (See Comments)    Unknown reaction   Nsaids Other (See Comments)    Unknown reaction   Hydrocodone-Acetaminophen Itching    Patient Measurements: Height: 5\' 6"  (167.6 cm) Weight: 80.5 kg (177 lb 7.5 oz) IBW/kg (Calculated) : 63.8 Heparin Dosing Weight: 81.3 kg  Vital Signs: Temp: 97.7 F (36.5 C) (09/18 1000) Temp Source: Core (09/18 0400) BP: 90/72 (09/18 0754) Pulse Rate: 90 (09/18 1000)  Labs: Recent Labs    05/23/23 0438 05/23/23 0439 05/24/23 0411 05/24/23 0413 05/24/23 1600 05/24/23 1602 05/24/23 2219 05/25/23 0009 05/25/23 0412 05/25/23 0421 05/25/23 0836  HGB 7.3*   < > 8.7*   < > 8.0*   < >  --    < > 9.0* 9.5* 9.2*  HCT 21.8*   < > 25.3*   < > 24.3*   < >  --    < > 27.3* 28.0* 27.0*  PLT 43*   < > 38*  --  33*  --   --   --  28*  --   --   APTT 60*   < > 66*  --  76*  --  66*  --  59*  --   --   LABPROT 21.5*  --  20.7*  --   --   --   --   --  19.5*  --   --   INR 1.8*  --  1.8*  --   --   --   --   --  1.6*  --   --   CREATININE 1.25*   < > 1.24  --  1.29*  --   --   --  1.19  --   --    < > = values in this interval not displayed.    Estimated Creatinine Clearance: 63.4 mL/min (by C-G formula based on SCr of 1.19 mg/dL).   Assessment: 62 yom underwent Bentall/AVR, TV repair, re-implantation of SVG-RCA complicated with vasoplegia and pulmonary edema requiring central VA ECMO. No AC PTA.  aPTT remains therapeutic at 59 sec. No bleeding per RN. H/H remains stable.  Goal of Therapy:  aPTT 50-70 seconds Monitor platelets by  anticoagulation protocol: Yes   Plan:  Continue bivalirudin at 0.017 mg/kg/hr  Monitor q12 hr aPTT and CBC, and for s/sx of bleeding   Wilmer Floor, PharmD PGY2 Cardiology Pharmacy Resident 05/25/2023 11:01 AM

## 2023-05-25 NOTE — Procedures (Signed)
Bedside Bronchoscopy Procedure Note [name] [id number] Mar 21, 1961  Procedure: Bronchoscopy Indications: Diagnostic evaluation of the airways, Obtain specimens for culture and/or other diagnostic studies, and Remove secretions  Procedure Details: ET Tube Size: ET Tube secured at lip (cm): Bite block in place: Yes In preparation for procedure, Patient hyper-oxygenated with 100 % FiO2 Airway entered and the following bronchi were examined: RUL, RML, RLL, LUL, LLL, and Bronchi.   Bronchoscope removed.    Evaluation BP 90/72   Pulse 82   Temp 97.7 F (36.5 C)   Resp 11   Ht 5\' 6"  (1.676 m)   Wt 80.5 kg   SpO2 99%   BMI 28.64 kg/m  Breath Sounds:Diminished O2 sats: stable throughout Patient's Current Condition: stable Specimens:  None Complications: No apparent complications Patient did tolerate procedure well. Assisted Dr. Tonia Brooms with bronch. ECMO team & bedside RN were present during bronch.   [name], Margaretmary Dys 05/25/2023, 9:10 AM

## 2023-05-25 NOTE — Progress Notes (Signed)
Patient ID: Sumedh Carelock, male   DOB: 1960-11-07, 62 y.o.   MRN: [id number]    Progress Note from the Palliative Medicine Team at [facility]   Patient Name: Coreyon Cowherd        Date: 05/25/2023 DOB: 1961/03/22  Age: 62 y.o. MRN#: [id number] Attending Physician: Alleen Borne, MD Primary Care Physician: Marcine Matar, MD Admit Date: 05/17/2023   Reason for Consultation/Follow-up   Palliative medicine support/patient is on ECMO   HPI/ Brief Hospital Review  62 y.o. male  with past medical history of CAD s/p CABG 2017 w/ AVR (magna ease for bicuspid valve disease), HLD, HTN, prior CVA, T2DM, CKD4 was previously on HD admitted to [facility] on 05/17/2023 for evaluation of chest pain.    He was found to have an aortic valve dehiscence confirmed by TEE on 05/18/23. Started CRRT for volume overload that day. 05/20/23 he went to OR for redo of aortic valve, tricuspid valve repair and ascending aortic root replacement with re-implantation of SVG to RCA; could not come off CPB, cannulated and started on VA ECMO with central cannulation   Recent admission on 5/25 with AKI and AMS, had PEA arrest with ROSC in 8 minutes, required dialysis, also with MSSA bacteremia associated with tricuspid valve endocarditis, suffered right cerebellar CVA and had cholangitis/pancreatitis requiring ERCP.   Significant Hospital events/per CCM  9/10 admitted 9/11 echo showing aortic valve dehiscence, confirmed by TEE.  9/11 started (back) on CRRT for volume overload and known CKD 4 9/12 breathing better after CRRT 9/13 to OR for Redo of aortic valve, tricuspid valve repair and ascending aortic root replacement w/ re-implantation of SVG to RCA. Post op TEE EF 55% RV mid-mod HK, AVR/TV repair stable. Could not come off CPB. Worse pulmonary edema. High dose pressors. Cannulated and started on VA ECMO w/ central cannulation  9/16 MDT ECMO rounds this AM, plans to remove more volume, 1 U PRBCs  9/18 bronch, clot and mucus  plugs removed   Patient remains critically ill with multiorgan system failure.  Subjective  Extensive chart review has been completed  including labs, vital signs, imaging, progress/consult notes, orders, medications and available advance directive documents.  Discussed with nursing staff at bedside   This NP assessed patient at the bedside for palliative medicine  needs and emotional  support, no family at bedside  PMT will continue to follow from a distance.  Please reach out with any palliative medicine needs   Discussed with primary team and nursing staff  Time:  25  minutes  Detailed review of medical records ( labs, imaging, vital signs), medically appropriate exam ( MS, skin, cardiac,  resp)   discussed with treatment team, counseling and education to patient, family, staff, documenting clinical information, medication management, coordination of care    Lorinda Creed NP  Palliative Medicine Team Team Phone # [phone] Pager [phone]

## 2023-05-26 ENCOUNTER — Encounter (HOSPITAL_COMMUNITY): Admission: EM | Disposition: E | Payer: Self-pay | Source: Home / Self Care | Attending: Surgery

## 2023-05-26 ENCOUNTER — Inpatient Hospital Stay (HOSPITAL_COMMUNITY): Payer: PPO | Admitting: Anesthesiology

## 2023-05-26 ENCOUNTER — Inpatient Hospital Stay (HOSPITAL_COMMUNITY): Payer: PPO

## 2023-05-26 DIAGNOSIS — I5043 Acute on chronic combined systolic (congestive) and diastolic (congestive) heart failure: Secondary | ICD-10-CM | POA: Diagnosis not present

## 2023-05-26 DIAGNOSIS — N186 End stage renal disease: Secondary | ICD-10-CM | POA: Diagnosis not present

## 2023-05-26 DIAGNOSIS — R570 Cardiogenic shock: Secondary | ICD-10-CM | POA: Diagnosis not present

## 2023-05-26 DIAGNOSIS — E8770 Fluid overload, unspecified: Secondary | ICD-10-CM | POA: Diagnosis not present

## 2023-05-26 DIAGNOSIS — I130 Hypertensive heart and chronic kidney disease with heart failure and stage 1 through stage 4 chronic kidney disease, or unspecified chronic kidney disease: Secondary | ICD-10-CM | POA: Diagnosis not present

## 2023-05-26 DIAGNOSIS — I9789 Other postprocedural complications and disorders of the circulatory system, not elsewhere classified: Secondary | ICD-10-CM | POA: Diagnosis not present

## 2023-05-26 HISTORY — PX: INTRAOPERATIVE TRANSESOPHAGEAL ECHOCARDIOGRAM: SHX5062

## 2023-05-26 HISTORY — PX: STERNAL CLOSURE: SHX6203

## 2023-05-26 LAB — CBC
HCT: 27 % — ABNORMAL LOW (ref 39.0–52.0)
HCT: 22.5 % — ABNORMAL LOW (ref 39.0–52.0)
HCT: 26.1 % — ABNORMAL LOW (ref 39.0–52.0)
Hemoglobin: 9.1 g/dL — ABNORMAL LOW (ref 13.0–17.0)
Hemoglobin: 7.4 g/dL — ABNORMAL LOW (ref 13.0–17.0)
Hemoglobin: 8.7 g/dL — ABNORMAL LOW (ref 13.0–17.0)
MCH: 30.1 pg (ref 26.0–34.0)
MCH: 29.6 pg (ref 26.0–34.0)
MCH: 30.1 pg (ref 26.0–34.0)
MCHC: 33.7 g/dL (ref 30.0–36.0)
MCHC: 32.9 g/dL (ref 30.0–36.0)
MCHC: 33.3 g/dL (ref 30.0–36.0)
MCV: 89.4 fL (ref 80.0–100.0)
MCV: 90 fL (ref 80.0–100.0)
MCV: 90.3 fL (ref 80.0–100.0)
Platelets: 20 10*3/uL — CL (ref 150–400)
Platelets: 45 10*3/uL — ABNORMAL LOW (ref 150–400)
Platelets: 38 10*3/uL — ABNORMAL LOW (ref 150–400)
RBC: 3.02 MIL/uL — ABNORMAL LOW (ref 4.22–5.81)
RBC: 2.5 MIL/uL — ABNORMAL LOW (ref 4.22–5.81)
RBC: 2.89 MIL/uL — ABNORMAL LOW (ref 4.22–5.81)
RDW: 18.8 % — ABNORMAL HIGH (ref 11.5–15.5)
RDW: 19.3 % — ABNORMAL HIGH (ref 11.5–15.5)
RDW: 18.6 % — ABNORMAL HIGH (ref 11.5–15.5)
WBC: 48.7 10*3/uL — ABNORMAL HIGH (ref 4.0–10.5)
WBC: 49.7 10*3/uL — ABNORMAL HIGH (ref 4.0–10.5)
WBC: 49.2 10*3/uL — ABNORMAL HIGH (ref 4.0–10.5)
nRBC: 0.9 % — ABNORMAL HIGH (ref 0.0–0.2)
nRBC: 0.7 % — ABNORMAL HIGH (ref 0.0–0.2)
nRBC: 0.7 % — ABNORMAL HIGH (ref 0.0–0.2)

## 2023-05-26 LAB — ECHO INTRAOPERATIVE TEE
Height: 66 in
Weight: 2765.45 oz

## 2023-05-26 LAB — RENAL FUNCTION PANEL
Albumin: 2.3 g/dL — ABNORMAL LOW (ref 3.5–5.0)
Albumin: 2.2 g/dL — ABNORMAL LOW (ref 3.5–5.0)
Anion gap: 9 (ref 5–15)
Anion gap: 11 (ref 5–15)
BUN: 21 mg/dL (ref 8–23)
BUN: 20 mg/dL (ref 8–23)
CO2: 22 mmol/L (ref 22–32)
CO2: 22 mmol/L (ref 22–32)
Calcium: 8.2 mg/dL — ABNORMAL LOW (ref 8.9–10.3)
Calcium: 7.8 mg/dL — ABNORMAL LOW (ref 8.9–10.3)
Chloride: 104 mmol/L (ref 98–111)
Chloride: 102 mmol/L (ref 98–111)
Creatinine, Ser: 1.14 mg/dL (ref 0.61–1.24)
Creatinine, Ser: 1.33 mg/dL — ABNORMAL HIGH (ref 0.61–1.24)
GFR, Estimated: >60 mL/min (ref >60)
GFR, Estimated: >60 mL/min (ref >60)
Glucose, Bld: 118 mg/dL — ABNORMAL HIGH (ref 70–99)
Glucose, Bld: 127 mg/dL — ABNORMAL HIGH (ref 70–99)
Phosphorus: 1.7 mg/dL — ABNORMAL LOW (ref 2.5–4.6)
Phosphorus: 3.6 mg/dL (ref 2.5–4.6)
Potassium: 4 mmol/L (ref 3.5–5.1)
Potassium: 4.5 mmol/L (ref 3.5–5.1)
Sodium: 135 mmol/L (ref 135–145)
Sodium: 135 mmol/L (ref 135–145)

## 2023-05-26 LAB — POCT I-STAT 7, (LYTES, BLD GAS, ICA,H+H)
Acid-Base Excess: 0 mmol/L (ref 0.0–2.0)
Acid-base deficit: 3 mmol/L — ABNORMAL HIGH (ref 0.0–2.0)
Acid-base deficit: 3 mmol/L — ABNORMAL HIGH (ref 0.0–2.0)
Acid-base deficit: 2 mmol/L (ref 0.0–2.0)
Acid-base deficit: 1 mmol/L (ref 0.0–2.0)
Acid-base deficit: 2 mmol/L (ref 0.0–2.0)
Acid-base deficit: 2 mmol/L (ref 0.0–2.0)
Bicarbonate: 24.5 mmol/L (ref 20.0–28.0)
Bicarbonate: 23.5 mmol/L (ref 20.0–28.0)
Bicarbonate: 23.9 mmol/L (ref 20.0–28.0)
Bicarbonate: 24 mmol/L (ref 20.0–28.0)
Bicarbonate: 25.8 mmol/L (ref 20.0–28.0)
Bicarbonate: 24.3 mmol/L (ref 20.0–28.0)
Bicarbonate: 23.6 mmol/L (ref 20.0–28.0)
Calcium, Ion: 1.16 mmol/L (ref 1.15–1.40)
Calcium, Ion: 1.06 mmol/L — ABNORMAL LOW (ref 1.15–1.40)
Calcium, Ion: 1.1 mmol/L — ABNORMAL LOW (ref 1.15–1.40)
Calcium, Ion: 1.08 mmol/L — ABNORMAL LOW (ref 1.15–1.40)
Calcium, Ion: 1.11 mmol/L — ABNORMAL LOW (ref 1.15–1.40)
Calcium, Ion: 1.12 mmol/L — ABNORMAL LOW (ref 1.15–1.40)
Calcium, Ion: 1.12 mmol/L — ABNORMAL LOW (ref 1.15–1.40)
HCT: 28 % — ABNORMAL LOW (ref 39.0–52.0)
HCT: 22 % — ABNORMAL LOW (ref 39.0–52.0)
HCT: 27 % — ABNORMAL LOW (ref 39.0–52.0)
HCT: 25 % — ABNORMAL LOW (ref 39.0–52.0)
HCT: 26 % — ABNORMAL LOW (ref 39.0–52.0)
HCT: 26 % — ABNORMAL LOW (ref 39.0–52.0)
HCT: 26 % — ABNORMAL LOW (ref 39.0–52.0)
Hemoglobin: 9.5 g/dL — ABNORMAL LOW (ref 13.0–17.0)
Hemoglobin: 7.5 g/dL — ABNORMAL LOW (ref 13.0–17.0)
Hemoglobin: 9.2 g/dL — ABNORMAL LOW (ref 13.0–17.0)
Hemoglobin: 8.5 g/dL — ABNORMAL LOW (ref 13.0–17.0)
Hemoglobin: 8.8 g/dL — ABNORMAL LOW (ref 13.0–17.0)
Hemoglobin: 8.8 g/dL — ABNORMAL LOW (ref 13.0–17.0)
Hemoglobin: 8.8 g/dL — ABNORMAL LOW (ref 13.0–17.0)
O2 Saturation: 99 %
O2 Saturation: 100 %
O2 Saturation: 100 %
O2 Saturation: 100 %
O2 Saturation: 100 %
O2 Saturation: 99 %
O2 Saturation: 98 %
Patient temperature: 36.8
Patient temperature: 36.1
Patient temperature: 36.3
Patient temperature: 36.4
Patient temperature: 36.5
Patient temperature: 36.4
Patient temperature: 36.5
Potassium: 4.1 mmol/L (ref 3.5–5.1)
Potassium: 3.9 mmol/L (ref 3.5–5.1)
Potassium: 4.6 mmol/L (ref 3.5–5.1)
Potassium: 4.4 mmol/L (ref 3.5–5.1)
Potassium: 4.6 mmol/L (ref 3.5–5.1)
Potassium: 4.5 mmol/L (ref 3.5–5.1)
Potassium: 4.4 mmol/L (ref 3.5–5.1)
Sodium: 137 mmol/L (ref 135–145)
Sodium: 138 mmol/L (ref 135–145)
Sodium: 135 mmol/L (ref 135–145)
Sodium: 135 mmol/L (ref 135–145)
Sodium: 136 mmol/L (ref 135–145)
Sodium: 136 mmol/L (ref 135–145)
Sodium: 136 mmol/L (ref 135–145)
TCO2: 26 mmol/L (ref 22–32)
TCO2: 25 mmol/L (ref 22–32)
TCO2: 25 mmol/L (ref 22–32)
TCO2: 25 mmol/L (ref 22–32)
TCO2: 27 mmol/L (ref 22–32)
TCO2: 26 mmol/L (ref 22–32)
TCO2: 25 mmol/L (ref 22–32)
pCO2 arterial: 40.2 mmHg (ref 32–48)
pCO2 arterial: 46 mmHg (ref 32–48)
pCO2 arterial: 47.3 mmHg (ref 32–48)
pCO2 arterial: 45.9 mmHg (ref 32–48)
pCO2 arterial: 48.9 mmHg — ABNORMAL HIGH (ref 32–48)
pCO2 arterial: 45 mmHg (ref 32–48)
pCO2 arterial: 42.9 mmHg (ref 32–48)
pH, Arterial: 7.393 (ref 7.35–7.45)
pH, Arterial: 7.312 — ABNORMAL LOW (ref 7.35–7.45)
pH, Arterial: 7.308 — ABNORMAL LOW (ref 7.35–7.45)
pH, Arterial: 7.323 — ABNORMAL LOW (ref 7.35–7.45)
pH, Arterial: 7.328 — ABNORMAL LOW (ref 7.35–7.45)
pH, Arterial: 7.337 — ABNORMAL LOW (ref 7.35–7.45)
pH, Arterial: 7.346 — ABNORMAL LOW (ref 7.35–7.45)
pO2, Arterial: 148 mmHg — ABNORMAL HIGH (ref 83–108)
pO2, Arterial: 239 mmHg — ABNORMAL HIGH (ref 83–108)
pO2, Arterial: 194 mmHg — ABNORMAL HIGH (ref 83–108)
pO2, Arterial: 197 mmHg — ABNORMAL HIGH (ref 83–108)
pO2, Arterial: 227 mmHg — ABNORMAL HIGH (ref 83–108)
pO2, Arterial: 127 mmHg — ABNORMAL HIGH (ref 83–108)
pO2, Arterial: 109 mmHg — ABNORMAL HIGH (ref 83–108)

## 2023-05-26 LAB — HEPATIC FUNCTION PANEL
ALT: 12 U/L (ref 0–44)
AST: 64 U/L — ABNORMAL HIGH (ref 15–41)
Albumin: 2.3 g/dL — ABNORMAL LOW (ref 3.5–5.0)
Alkaline Phosphatase: 105 U/L (ref 38–126)
Bilirubin, Direct: 0.7 mg/dL — ABNORMAL HIGH (ref 0.0–0.2)
Indirect Bilirubin: 0.8 mg/dL (ref 0.3–0.9)
Total Bilirubin: 1.5 mg/dL — ABNORMAL HIGH (ref 0.3–1.2)
Total Protein: 6.3 g/dL — ABNORMAL LOW (ref 6.5–8.1)

## 2023-05-26 LAB — GLUCOSE, CAPILLARY
Glucose-Capillary: 126 mg/dL — ABNORMAL HIGH (ref 70–99)
Glucose-Capillary: 103 mg/dL — ABNORMAL HIGH (ref 70–99)
Glucose-Capillary: 128 mg/dL — ABNORMAL HIGH (ref 70–99)
Glucose-Capillary: 117 mg/dL — ABNORMAL HIGH (ref 70–99)
Glucose-Capillary: 142 mg/dL — ABNORMAL HIGH (ref 70–99)

## 2023-05-26 LAB — FIBRINOGEN: Fibrinogen: >800 mg/dL — ABNORMAL HIGH (ref 210–475)

## 2023-05-26 LAB — APTT
aPTT: 68 seconds — ABNORMAL HIGH (ref 24–36)
aPTT: 74 seconds — ABNORMAL HIGH (ref 24–36)
aPTT: 59 seconds — ABNORMAL HIGH (ref 24–36)

## 2023-05-26 LAB — PROTIME-INR
INR: 1.5 — ABNORMAL HIGH (ref 0.8–1.2)
Prothrombin Time: 18.6 seconds — ABNORMAL HIGH (ref 11.4–15.2)

## 2023-05-26 LAB — LACTATE DEHYDROGENASE: LDH: 1426 U/L — ABNORMAL HIGH (ref 98–192)

## 2023-05-26 LAB — MAGNESIUM: Magnesium: 2.5 mg/dL — ABNORMAL HIGH (ref 1.7–2.4)

## 2023-05-26 LAB — PREPARE RBC (CROSSMATCH)

## 2023-05-26 LAB — LACTIC ACID, PLASMA: Lactic Acid, Venous: 1.6 mmol/L (ref 0.5–1.9)

## 2023-05-26 SURGERY — CLOSURE, STERNUM
Anesthesia: General | Site: Esophagus

## 2023-05-26 MED ORDER — SODIUM CHLORIDE 0.9% IV SOLUTION: Freq: Once | INTRAVENOUS | Status: AC

## 2023-05-26 MED ORDER — HEMOSTATIC AGENTS (NO CHARGE) OPTIME
TOPICAL | Status: DC | PRN
Start: 2023-05-26 — End: 2023-05-26
  Administered 2023-05-26 (×2): 1 via TOPICAL

## 2023-05-26 MED ORDER — VASOPRESSIN 20 UNITS/100 ML INFUSION FOR SHOCK
0.0000 [IU]/min | INTRAVENOUS | Status: DC
  Administered 2023-05-26: 0.03 [IU]/min via INTRAVENOUS
  Administered 2023-05-27: 0.04 [IU]/min via INTRAVENOUS
  Administered 2023-05-27: 0.03 [IU]/min via INTRAVENOUS
  Administered 2023-05-27 – 2023-05-31 (×12): 0.04 [IU]/min via INTRAVENOUS
  Administered 2023-05-31 (×2): 0.05 [IU]/min via INTRAVENOUS
  Administered 2023-06-01 – 2023-06-03 (×7): 0.04 [IU]/min via INTRAVENOUS
  Administered 2023-06-06: 0.03 [IU]/min via INTRAVENOUS
  Administered 2023-06-07: 0.02 [IU]/min via INTRAVENOUS
  Administered 2023-06-07: 0.04 [IU]/min via INTRAVENOUS
  Administered 2023-06-07: 0.05 [IU]/min via INTRAVENOUS
  Administered 2023-06-08 – 2023-06-10 (×3): 0.03 [IU]/min via INTRAVENOUS
  Filled 2023-05-26 (×33): qty 100

## 2023-05-26 MED ORDER — METHYLNALTREXONE BROMIDE 12 MG/0.6ML ~~LOC~~ SOLN
12.0000 mg | Freq: Once | SUBCUTANEOUS | Status: AC
  Administered 2023-05-26: 12 mg via SUBCUTANEOUS
  Filled 2023-05-26: qty 0.6

## 2023-05-26 MED ORDER — POTASSIUM PHOSPHATES 15 MMOLE/5ML IV SOLN
15.0000 mmol | Freq: Once | INTRAVENOUS | Status: AC
  Administered 2023-05-26: 15 mmol via INTRAVENOUS
  Filled 2023-05-26: qty 5

## 2023-05-26 MED ORDER — ROCURONIUM BROMIDE 10 MG/ML (PF) SYRINGE
PREFILLED_SYRINGE | INTRAVENOUS | Status: DC | PRN
  Administered 2023-05-26: 100 mg via INTRAVENOUS

## 2023-05-26 MED ORDER — SODIUM CHLORIDE 0.9 % IV SOLN
0.0050 mg/kg/h | INTRAVENOUS | Status: DC
  Administered 2023-05-28: 0.006 mg/kg/h via INTRAVENOUS
  Filled 2023-05-26 (×3): qty 250

## 2023-05-26 MED ORDER — THROMBIN (RECOMBINANT) 20000 UNITS EX SOLR
CUTANEOUS | Status: AC
  Filled 2023-05-26: qty 20000

## 2023-05-26 MED ORDER — THROMBIN 20000 UNITS EX SOLR
CUTANEOUS | Status: DC | PRN
  Administered 2023-05-26: 20000 [IU] via TOPICAL

## 2023-05-26 MED ORDER — 0.9 % SODIUM CHLORIDE (POUR BTL) OPTIME
TOPICAL | Status: DC | PRN
Start: 2023-05-26 — End: 2023-05-26
  Administered 2023-05-26: 4000 mL

## 2023-05-26 MED FILL — Lidocaine HCl Local Preservative Free (PF) Inj 2%: INTRAMUSCULAR | Qty: 14 | Status: AC

## 2023-05-26 MED FILL — Heparin Sodium (Porcine) Inj 1000 Unit/ML: Qty: 1000 | Status: AC

## 2023-05-26 MED FILL — Potassium Chloride Inj 2 mEq/ML: INTRAVENOUS | Qty: 40 | Status: AC

## 2023-05-26 SURGICAL SUPPLY — 101 items
ANCHOR CATH FOLEY SECURE (MISCELLANEOUS) ×2 IMPLANT
BAG DECANTER FOR FLEXI CONT (MISCELLANEOUS) ×4 IMPLANT
BANDAGE ESMARK 6X9 LF (GAUZE/BANDAGES/DRESSINGS) ×1 IMPLANT
BLADE CLIPPER SURG (BLADE) ×3 IMPLANT
BLADE SURG 11 STRL SS (BLADE) ×1 IMPLANT
BLADE SURG 15 STRL LF DISP TIS (BLADE) ×1 IMPLANT
BLADE SURG 15 STRL SS (BLADE) ×3
BNDG CMPR 9X6 STRL LF SNTH (GAUZE/BANDAGES/DRESSINGS) ×3
BNDG ESMARK 6X9 LF (GAUZE/BANDAGES/DRESSINGS) ×3 IMPLANT
BRUSH SCRUB EZ PLAIN DRY (MISCELLANEOUS) ×6 IMPLANT
CANISTER SUCT 3000ML PPV (MISCELLANEOUS) ×5 IMPLANT
CATH ROBINSON RED A/P 18FR (CATHETERS) IMPLANT
CATH THORACIC 28FR (CATHETERS) ×1 IMPLANT
CLIP TI MEDIUM 24 (CLIP) ×1 IMPLANT
CLIP TI WIDE RED SMALL 24 (CLIP) ×1 IMPLANT
CONTAINER PROTECT SURGISLUSH (MISCELLANEOUS) ×4 IMPLANT
COVER CAMERA OR STL DISP (MISCELLANEOUS) ×1 IMPLANT
COVER SURGICAL LIGHT HANDLE (MISCELLANEOUS) ×6 IMPLANT
DRAPE CARDIOVASCULAR INCISE (DRAPES)
DRAPE CV SPLIT W-CLR ANES SCRN (DRAPES) ×1 IMPLANT
DRAPE INCISE IOBAN 66X45 STRL (DRAPES) ×1 IMPLANT
DRAPE PERI GROIN 82X75IN TIB (DRAPES) ×1 IMPLANT
DRAPE SRG 135X102X78XABS (DRAPES) ×3 IMPLANT
DRAPE WARM FLUID 44X44 (DRAPES) IMPLANT
DRSG COVADERM 4X14 (GAUZE/BANDAGES/DRESSINGS) ×4 IMPLANT
ELECT CAUTERY BLADE 6.4 (BLADE) ×4 IMPLANT
ELECT REM PT RETURN 9FT ADLT (ELECTROSURGICAL) ×6 IMPLANT
ELECTRODE REM PT RTRN 9FT ADLT (ELECTROSURGICAL) ×8 IMPLANT
FELT TEFLON 1X6 (MISCELLANEOUS) ×1 IMPLANT
GAUZE 4X4 16PLY ~~LOC~~+RFID DBL (SPONGE) ×4 IMPLANT
GAUZE SPONGE 4X4 12PLY STRL (GAUZE/BANDAGES/DRESSINGS) ×7 IMPLANT
GLOVE BIO SURGEON STRL SZ 6 (GLOVE) ×1 IMPLANT
GLOVE BIO SURGEON STRL SZ 6.5 (GLOVE) ×2 IMPLANT
GLOVE BIO SURGEON STRL SZ7 (GLOVE) IMPLANT
GLOVE BIO SURGEON STRL SZ7.5 (GLOVE) IMPLANT
GLOVE BIOGEL PI IND STRL 6.5 (GLOVE) ×1 IMPLANT
GLOVE BIOGEL PI IND STRL 7.0 (GLOVE) IMPLANT
GLOVE BIOGEL PI IND STRL 7.5 (GLOVE) ×1 IMPLANT
GLOVE ECLIPSE 7.0 STRL STRAW (GLOVE) ×1 IMPLANT
GLOVE SS BIOGEL STRL SZ 6.5 (GLOVE) ×1 IMPLANT
GLOVE SURG MICRO LTX SZ7 (GLOVE) ×8 IMPLANT
GOWN STRL REUS W/ TWL LRG LVL3 (GOWN DISPOSABLE) ×16 IMPLANT
GOWN STRL REUS W/ TWL XL LVL3 (GOWN DISPOSABLE) ×4 IMPLANT
GOWN STRL REUS W/TWL LRG LVL3 (GOWN DISPOSABLE) ×12
GOWN STRL REUS W/TWL XL LVL3 (GOWN DISPOSABLE) ×3
HEMOSTAT POWDER SURGIFOAM 1G (HEMOSTASIS) ×11 IMPLANT
HEMOSTAT SURGICEL 2X14 (HEMOSTASIS) ×3 IMPLANT
IV ADAPTER SYR DOUBLE MALE LL (MISCELLANEOUS) ×1 IMPLANT
IV ADPR TBG 2 MALE LL ART (MISCELLANEOUS) ×3
KIT BASIN OR (CUSTOM PROCEDURE TRAY) ×4 IMPLANT
KIT CATH CPB BARTLE (MISCELLANEOUS) IMPLANT
KIT SUCTION CATH 14FR (SUCTIONS) ×4 IMPLANT
KIT TURNOVER KIT B (KITS) ×4 IMPLANT
NS IRRIG 1000ML POUR BTL (IV SOLUTION) ×19 IMPLANT
PACK CHEST (CUSTOM PROCEDURE TRAY) ×4 IMPLANT
PACK E OPEN HEART (SUTURE) ×4 IMPLANT
PAD ARMBOARD 7.5X6 YLW CONV (MISCELLANEOUS) ×8 IMPLANT
PAD ELECT DEFIB RADIOL ZOLL (MISCELLANEOUS) ×4 IMPLANT
PENCIL BUTTON HOLSTER BLD 10FT (ELECTRODE) ×1 IMPLANT
POSITIONER HEAD DONUT 9IN (MISCELLANEOUS) ×4 IMPLANT
SET MICROPUNCTURE 5F STIFF (MISCELLANEOUS) ×1 IMPLANT
SPONGE T-LAP 18X18 ~~LOC~~+RFID (SPONGE) ×13 IMPLANT
SPONGE T-LAP 4X18 ~~LOC~~+RFID (SPONGE) ×3 IMPLANT
STOPCOCK 4 WAY LG BORE MALE ST (IV SETS) ×1 IMPLANT
SUT ETHIBOND 2 0 SH (SUTURE) ×3
SUT ETHIBOND 2 0 SH 36X2 (SUTURE) ×1 IMPLANT
SUT MNCRL AB 4-0 PS2 18 (SUTURE) IMPLANT
SUT PROLENE 2 0 MH 48 (SUTURE) ×2 IMPLANT
SUT PROLENE 3 0 SH1 36 (SUTURE) ×1 IMPLANT
SUT PROLENE 4 0 RB 1 (SUTURE) ×3
SUT PROLENE 4-0 RB1 .5 CRCL 36 (SUTURE) ×1 IMPLANT
SUT PROLENE 5 0 C 1 36 (SUTURE) ×1 IMPLANT
SUT PROLENE 6 0 C 1 30 (SUTURE) IMPLANT
SUT PROLENE 7 0 BV 1 (SUTURE) IMPLANT
SUT PROLENE 7 0 BV1 MDA (SUTURE) IMPLANT
SUT PROLENE 8 0 BV175 6 (SUTURE) IMPLANT
SUT SILK 1 MH (SUTURE) ×1 IMPLANT
SUT SILK 1 TIES 10X30 (SUTURE) ×1 IMPLANT
SUT SILK 2 0 SH CR/8 (SUTURE) ×1 IMPLANT
SUT SILK 2 0 TIES 10X30 (SUTURE) ×1 IMPLANT
SUT SILK 3 0 TIES 10X30 (SUTURE) ×1 IMPLANT
SUT SILK 4 0 TIE 10X30 (SUTURE) ×1 IMPLANT
SUT STEEL STERNAL CCS#1 18IN (SUTURE) IMPLANT
SUT STEEL SZ 6 DBL 3X14 BALL (SUTURE) IMPLANT
SUT VIC AB 1 CTX 36 (SUTURE) ×6
SUT VIC AB 1 CTX36XBRD ANBCTR (SUTURE) ×8 IMPLANT
SUT VIC AB 3-0 SH 27 (SUTURE)
SUT VIC AB 3-0 SH 27X BRD (SUTURE) IMPLANT
SUT VICRYL 4-0 PS2 18IN ABS (SUTURE) IMPLANT
SYR 5ML LL (SYRINGE) ×1 IMPLANT
SYSTEM SAHARA CHEST DRAIN ATS (WOUND CARE) ×2 IMPLANT
TOWEL GREEN STERILE (TOWEL DISPOSABLE) ×5 IMPLANT
TOWEL GREEN STERILE FF (TOWEL DISPOSABLE) ×4 IMPLANT
TOWEL OR NON WOVEN STRL DISP B (DISPOSABLE) ×1 IMPLANT
TRAY CATH LUMEN 1 20CM STRL (SET/KITS/TRAYS/PACK) ×1 IMPLANT
TUBE CONNECTING 12X1/4 (SUCTIONS) ×2 IMPLANT
TUBE CONNECTING 20X1/4 (TUBING) ×2 IMPLANT
TUBING ART PRESS 48 MALE/FEM (TUBING) ×2 IMPLANT
UNDERPAD 30X36 HEAVY ABSORB (UNDERPADS AND DIAPERS) ×4 IMPLANT
WATER STERILE IRR 1000ML POUR (IV SOLUTION) ×8 IMPLANT
YANKAUER SUCT BULB TIP NO VENT (SUCTIONS) ×1 IMPLANT

## 2023-05-26 NOTE — Progress Notes (Signed)
Patient ID: [name], male   DOB: 08-Jun-1961, 62 y.o.   MRN: [id number]   Extracorporeal support note   ECLS support day: Indication: Post-op vasoplegia and pulmonary edema  Configuration: Central VA ECMO  Drainage cannula: RA Return cannula: Ascending aorta  Pump speed: 2950 rpm  Pump flow: 3.2 L/min Pump used: Cardiohelp  Sweep gas: 5.5 with blender to lower PaO2  Circuit check: No fibrin Anticoagulant: Bivalirudin Anticoagulation targets: PTT 50-70  Changes in support: none  Anticipated goals/duration of support: Wean to decannulation  .[name]  05/26/2023, 7:20 AM

## 2023-05-26 NOTE — Progress Notes (Signed)
Pt was transported to OR without complication.

## 2023-05-26 NOTE — Anesthesia Preprocedure Evaluation (Signed)
Anesthesia Evaluation  Patient identified by MRN, date of birth, ID band Patient unresponsive    Reviewed: Allergy & Precautions, H&P , NPO status , Patient's Chart, lab work & pertinent test results  Airway Mallampati: Intubated       Dental   Pulmonary sleep apnea , Patient abstained from smoking.   breath sounds clear to auscultation       Cardiovascular hypertension, + CAD, + Past MI, + CABG and +CHF  + Valvular Problems/Murmurs  Rhythm:regular Rate:Normal  Pt on VA ecmo  9/13 re-do AVR/bentall. Unable to wean from CPB.   Neuro/Psych  PSYCHIATRIC DISORDERS Anxiety     TIACVA    GI/Hepatic ,GERD  ,,  Endo/Other  diabetes, Type 2    Renal/GU ESRFRenal disease     Musculoskeletal  (+) Arthritis ,    Abdominal   Peds  Hematology  (+) Blood dyscrasia, anemia PLTS 20 INR 1.5   Anesthesia Other Findings   Reproductive/Obstetrics                             Anesthesia Physical Anesthesia Plan  ASA: 4  Anesthesia Plan: General   Post-op Pain Management:    Induction: Intravenous  PONV Risk Score and Plan: 2 and Ondansetron, Dexamethasone and Treatment may vary due to age or medical condition  Airway Management Planned: Oral ETT  Additional Equipment: Arterial line, CVP and TEE  Intra-op Plan:   Post-operative Plan: Post-operative intubation/ventilation  Informed Consent: I have reviewed the patients History and Physical, chart, labs and discussed the procedure including the risks, benefits and alternatives for the proposed anesthesia with the patient or authorized representative who has indicated his/her understanding and acceptance.     Dental advisory given  Plan Discussed with: CRNA, Anesthesiologist and Surgeon  Anesthesia Plan Comments:        Anesthesia Quick Evaluation

## 2023-05-26 NOTE — Interval H&P Note (Signed)
History and Physical Interval Note:  05/26/2023 6:51 AM  [name]  has presented today for surgery, with the diagnosis of CARDIOGENIC SHOCK.  The various methods of treatment have been discussed with the patient and family. After consideration of risks, benefits and other options for treatment, the patient has consented to  Procedure(s): CHEST CLOSURE WITH WASHOUT (N/A) possible DECANNULATION FOR VA ECMO (N/A) possible CANNULATION FOR VV ECMO (EXTRACORPOREAL MEMBRANE OXYGENATION) (N/A) TRANSESOPHAGEAL ECHOCARDIOGRAM (N/A) as a surgical intervention.  The patient's history has been reviewed, patient examined, no change in status, stable for surgery.  I have reviewed the patient's chart and labs.  Questions were answered to the patient's satisfaction.     Alleen Borne

## 2023-05-26 NOTE — Progress Notes (Signed)
Brief Nutrition Follow-up:  Pt remains on vent support, OR today for washout today, chest remains open, VA ECMO.  Cortrak removed during TEE while on OR, OG placed.   TF remain on, rectal tube in place and starting to have small amounts of stool. OG placed yesterday with 2.75 mL output in 24 hours.   Discussed plan with Dr. Laneta Simmers. Holding on TF today, continue to decompress and administer bowel regimen. Possible reinsertion of Cortrak and possible TF tomorrow but will wait and reassess in AM.   Romelle Starcher MS, RDN, LDN, CNSC Registered Dietitian 3 Clinical Nutrition RD Pager and On-Call Pager Number Located in [place]

## 2023-05-26 NOTE — Plan of Care (Signed)
  Problem: Respiratory: Goal: Ability to maintain a clear airway and adequate ventilation will improve Outcome: Progressing   Problem: Cardiac: Goal: Will achieve and/or maintain hemodynamic stability Outcome: Progressing   Problem: Respiratory: Goal: Respiratory status will improve Outcome: Progressing   Problem: Skin Integrity: Goal: Wound healing without signs and symptoms of infection Outcome: Progressing

## 2023-05-26 NOTE — Progress Notes (Signed)
NAME:  Janet Szwed, MRN:  [id number], DOB:  1961/08/11, LOS: 8 ADMISSION DATE:  06/01/2023, CONSULTATION DATE:  9/11 REFERRING MD:  Dr. Izora Ribas, CHIEF COMPLAINT:  aortic valve dehiscence   History of Present Illness:  Patient is [name] w/ pertinent PMH CAD s/p CABG 2017 w/ AVR (magna ease for bicuspid valve disease), HLD, HTN, prior CVA, T2DM, CKD4 was previously on HD presents to [facility] on 9/10 chest pain.  Patient recently admitted to [facility] on 5/25 w/ AKI and AMS. While in ED patient had PEA arrest w/ ROSC in about 8 minutes. Patient required dialysis on this admission. Also w/ MSSA bacteremia associated w/ tricuspid valve endocarditis. Patient transferred to [facility] on 5/25. Treated w/ 6 week course oxacillin and rifampin. This admission also suffered a R cerebellar CVA w/ MRI likely septic emboli and had cholangitis/pancreatitis requiring ERCP.  On 9/10 patient admitted to [facility] w/ chest pain, dyspnea, and BLE edema. BNP 2,097. CXR w/ pulm edema. Patient started on IV lasix infusion. Cards consulted. On 9/11 patient transferred to [facility]. Patient's echo showing possible aortic valve dehiscence. Cultures repeated and started on rocephin. Patient transferred to ICU for TEE and general anesthesia and to start CRRT. PCCM consulted.  Pertinent  Medical History   Past Medical History:  Diagnosis Date   Allergy    Anemia    Anxiety    Baker's cyst of knee    Blood transfusion without reported diagnosis    as baby    Coronary artery disease    quadruple bypass - March 2016   GERD (gastroesophageal reflux disease)    Gouty arthritis    "real bad" (01/17/2013)   Heart murmur    Hypercholesteremia    Hypertension    MSSA bacteremia 01/29/2023   Myocardial infarction (HCC) 2017   PEA (Pulseless electrical activity) (HCC) 01/29/2023   Stroke (HCC)    Type II diabetes mellitus (HCC)     Significant Hospital Events: Including procedures, antibiotic start and stop dates in  addition to other pertinent events   9/10 admitted 9/11 echo showing aortic valve dehiscence, confirmed by TEE.  9/11 started (back) on CRRT for volume overload and known CKD 4 9/12 breathing better after CRRT Jun 11, 2023 to OR for Redo of aortic valve, tricuspid valve repair and ascending aortic root replacement w/ re-implantation of SVG to RCA. Post op TEE EF 55% RV mid-mod HK, AVR/TV repair stable. Could not come off CPB. Worse pulmonary edema. High dose pressors. Cannulated and started on VA ECMO w/ central cannulation  9/16 MDT ECMO rounds this AM, plans to remove more volume, 1 U PRBCs  9/18 bronch, clot and mucus plugs removed  9/19 OR for washout   Interim History / Subjective:   Patient remains critically ill, intubated on mechanical life support, VA ECMO and CVVHD.  Objective   Blood pressure 92/64, pulse 87, temperature (!) 97.5 F (36.4 C), resp. rate 20, height 5\' 6"  (1.676 m), weight 78.4 kg, SpO2 99%. PAP: (16-38)/(13-24) 31/20 CVP:  [0 mmHg-10 mmHg] 6 mmHg  Vent Mode: PRVC FiO2 (%):  [50 %] 50 % Set Rate:  [15 bmp] 15 bmp Vt Set:  [400 mL] 400 mL PEEP:  [5 cmH20] 5 cmH20 Plateau Pressure:  [26 cmH20-28 cmH20] 28 cmH20   Intake/Output Summary (Last 24 hours) at 05/22/2023 1703 Last data filed at 05/13/2023 1700 Gross per 24 hour  Intake 4052.65 ml  Output 2036.2 ml  Net 2016.45 ml   [employer]  05/24/23 0515 05/25/23 0530 05/13/2023 0500  Weight: 84 kg 80.5 kg 78.4 kg    Examination: General: Elderly gentleman chronically ill-appearing critically ill intubated on mechanical support HEENT: Endotracheal tube in place Heart: Patient has an open chest, auscultation with regular rate rhythm S1-S2 Lungs: Diminished in the bilateral bases Abdomen: Soft, nontender nondistended Extremities: Bilateral lower extremity edema  Chest x-ray reviewed: Bilateral airspace disease, mild progression in the right lung compared to previous.  Looks like it is slightly improving on the  left.  Labs reviewed   Assessment & Plan:   Post cardiotomy vasoplegia/cardioplegia w/ failure to wean from CPB Acute on chronic combined HF  Hx MSSA endocarditis S/p AVR/Bentall and reimplantation of Coronaries and TV repair  Acute respiratory failure w/ hypoxia due to volume overload, with untreated OSA CKD 4. Had recently been on HD Circuit related hemolysis L>R BLE swelling ->LE [place] neg for DVT CAD s/p CABG in 2017 w/ AVR HTN HLD Hx of CVA DMT2 Leukocytosis, multifactorial   Plan: Patient remains in the intensive care unit on VA ECMO support OR today for washout. Multidisciplinary team rounding Patient was seen and assessed after visit from the operating room. Repeat blood gas shows little bit of worsening respiratory acidosis they have decreased this week.  Will increase his respiratory rate to increase his minute ventilation some. I am not sure that chasing his CO2 makes a difference if he will not tolerate coming down on his ECMO flow rates. Continue CVVHD for volume management Remains on low-dose pressors, maintain mean arterial pressure greater than 65. Likely plan to leave status quo over the weekend. Currently sedated with Dilaudid plus Versed.   Best Practice (right click and "Reselect all SmartList Selections" daily)   Diet/type: Regular consistency (see orders) DVT prophylaxis: bival to start today GI prophylaxis: PPI Lines: Central line and Dialysis Catheter Foley: yes Code Status:  full code Last date of multidisciplinary goals of care discussion [mdt team discussions]  This patient is critically ill with multiple organ system failure; which, requires frequent high complexity decision making, assessment, support, evaluation, and titration of therapies. This was completed through the application of advanced monitoring technologies and extensive interpretation of multiple databases. During this encounter critical care time was devoted to patient care services  described in this note for 32 minutes.   [name], DO [facility] 05/10/2023 5:03 PM

## 2023-05-26 NOTE — Progress Notes (Signed)
Patient ID: [name], male   DOB: [birth year], 62 y.o.   MRN: [id number]     Advanced Heart Failure Rounding Note  PCP-Cardiologist: Kristeen Miss, MD   Subjective:    Jun 05, 2023: OR for Bentall/AVR, TV repair, re-implantation of SVG-RCA.  Post-op vasoplegia and pulmonary edema, required central VA ECMO (RA drainage, ascending aorta return.  Post-op TEE EF 55%, mild-moderate RV hypokinesis.  9/14: Blender added to circuit due to high PaO2 9/16: TEE with EF 30%, moderate LVH, moderate RV dysfunction with mild enlargement, stable bioprosthetic aortic valve, repaired TV with mild TR, no endocarditis noted.  9/18: Bronchoscopy with mucus plugs and clots.  MAP 65-70 on epinephrine 8, NE 10,  and vasopressin 0.03. CVVH running even though weight down again.  CXR with some clearing of left lung, right lung still with prominent airspace disease without changes.  Tube feeds stopped as tube feed contents found in mouth this morning.      H/o MSSA endocarditis, currently on meropenem/vancomycin. WBCs 54 => 53 => 45 => 48.7K.   Hgb 9.1, plts 43 => 38 => 28 => 20  [place]wan: CVP 6 PA 23/17  Rhythm = junctional  VA ECMO 2950 rpm Flow 3.2 L/min pVen -25 Delta P 24 Sweep 5.5 LDH 775 => 1020 => 1431=> 1353 => 1209 => 1426 Lactate 1.3 => 1.5 => 1.4 => 1.1 => 2.1 => 1.6 ABG 7.39/40/148   Objective:   Weight Range: 78.4 kg Body mass index is 27.9 kg/m.   Vital Signs:   Temp:  [97.5 F (36.4 C)-97.9 F (36.6 C)] 97.5 F (36.4 C) (09/19 0700) Pulse Rate:  [79-95] 95 (09/19 0700) Resp:  [11-22] 15 (09/19 0700) BP: (90-94)/(72-79) 94/79 (09/18 1328) SpO2:  [77 %-100 %] 98 % (09/19 0700) Arterial Line BP: (70-94)/(59-80) 80/67 (09/19 0700) FiO2 (%):  [50 %] 50 % (09/19 0416) Weight:  [78.4 kg] 78.4 kg (09/19 0500) Last BM Date : 05/19/23  Weight change: Filed Weights   05/24/23 0515 05/25/23 0530 05/10/2023 0500  Weight: 84 kg 80.5 kg 78.4 kg    Intake/Output:   Intake/Output Summary  (Last 24 hours) at 05/13/2023 0721 Last data filed at 05/27/2023 0700 Gross per 24 hour  Intake 3829.13 ml  Output 6055.7 ml  Net -2226.57 ml      Physical Exam    General: Sedated on vent Neck: No JVD, no thyromegaly or thyroid nodule.  Lungs: Clear to auscultation bilaterally with normal respiratory effort. CV: Chest open with VA ECMO  Abdomen: Soft, no hepatosplenomegaly, no distention.  Skin: Intact without lesions or rashes.  Neurologic: Sedated on vent Extremities: No clubbing or cyanosis.  HEENT: Normal.   Telemetry   Junctional rhythm 70s (personally reviewed)   Labs    CBC Recent Labs    05/25/23 1703 05/25/23 2001 05/18/2023 0404 05/14/2023 0411  WBC 48.9*  --  48.7*  --   HGB 9.3*  10.2*   < > 9.1* 9.5*  HCT 28.6*  30.0*   < > 27.0* 28.0*  MCV 89.4  --  89.4  --   PLT 24*  --  20*  --    < > = values in this interval not displayed.   Basic Metabolic Panel Recent Labs    64/33/29 0412 05/25/23 0421 05/25/23 1703 05/25/23 2001 05/24/2023 0404 05/28/2023 0411  NA 138   < > 135  138   < > 135 137  K 3.4*   < > 4.0  4.1   < >  4.0 4.1  CL 105  --  102  --  104  --   CO2 23  --  22  --  22  --   GLUCOSE 225*  --  125*  --  118*  --   BUN 24*  --  23  --  21  --   CREATININE 1.19  --  1.14  --  1.14  --   CALCIUM 8.1*  --  8.2*  --  8.2*  --   MG 2.4  --   --   --  2.5*  --   PHOS <1.0*  --  1.6*  --  1.7*  --    < > = values in this interval not displayed.   Liver Function Tests Recent Labs    05/25/23 0412 05/25/23 1703 05/12/2023 0404  AST 48*  --  64*  ALT 9  --  12  ALKPHOS 115  --  105  BILITOT 1.2  --  1.5*  PROT 5.9*  --  6.3*  ALBUMIN 2.2*  2.2* 2.5* 2.3*  2.3*   No results for input(s): "LIPASE", "AMYLASE" in the last 72 hours. Cardiac Enzymes No results for input(s): "CKTOTAL", "CKMB", "CKMBINDEX", "TROPONINI" in the last 72 hours.  BNP: BNP (last 3 results) Recent Labs    05/30/2023 1100  BNP 2,097.0*    ProBNP (last 3  results) No results for input(s): "PROBNP" in the last 8760 hours.   D-Dimer No results for input(s): "DDIMER" in the last 72 hours. Hemoglobin A1C No results for input(s): "HGBA1C" in the last 72 hours.  Fasting Lipid Panel No results for input(s): "CHOL", "HDL", "LDLCALC", "TRIG", "CHOLHDL", "LDLDIRECT" in the last 72 hours. Thyroid Function Tests No results for input(s): "TSH", "T4TOTAL", "T3FREE", "THYROIDAB" in the last 72 hours.  Invalid input(s): "FREET3"  Other results:   Imaging    DG Abd Portable 1V  Result Date: 05/25/2023 CLINICAL DATA:  Check gastric catheter placement EXAM: PORTABLE ABDOMEN - 1 VIEW COMPARISON:  05/25/2023 FINDINGS: Postsurgical changes are noted. Multiple tubes and lines as well as ECMO cannula are noted. Bilateral thoracostomy catheters and a pericardial drain are seen. Weighted feeding catheter is identified coiled within the stomach with the distal tip directed towards the pylorus similar to that seen on prior exam. IMPRESSION: Postsurgical changes with tubes and lines as described above. Feeding catheter is noted in the stomach directed towards the pylorus. The tip is not visualized on this exam. Electronically Signed   By: Alcide Clever M.D.   On: 05/25/2023 19:25     Medications:     Scheduled Medications:  sodium chloride   Intravenous Once   atorvastatin  80 mg Per Tube q1800   bisacodyl  10 mg Oral Daily   Or   bisacodyl  10 mg Rectal Daily   Chlorhexidine Gluconate Cloth  6 each Topical Daily   docusate  100 mg Per Tube BID   ezetimibe  10 mg Per Tube Daily   feeding supplement (PROSource TF20)  60 mL Per Tube Q4H   insulin aspart  0-15 Units Subcutaneous Q4H   insulin aspart  2 Units Subcutaneous Q4H   metoCLOPramide (REGLAN) injection  5 mg Intravenous Q6H   multivitamin  1 tablet Per Tube BID   mouth rinse  15 mL Mouth Rinse Q2H   pantoprazole (PROTONIX) IV  40 mg Intravenous QHS   polyethylene glycol  17 g Per Tube BID    sodium chloride flush  3 mL  Intravenous Q12H    Infusions:   prismasol BGK 4/2.5 400 mL/hr at 05/10/2023 0432    prismasol BGK 4/2.5 400 mL/hr at 05/11/2023 0432   sodium chloride Stopped (05/24/23 0032)   sodium chloride Stopped (05/22/23 1310)   sodium chloride 10 mL/hr at 05/30/2023 0700   anticoagulant sodium citrate     bivalirudin (ANGIOMAX) 250 mg in sodium chloride 0.9 % 500 mL (0.5 mg/mL) infusion 0.015 mg/kg/hr (05/10/2023 0700)   dexmedetomidine (PRECEDEX) IV infusion 0.7 mcg/kg/hr (05/27/2023 0700)   epinephrine 8 mcg/min (05/30/2023 0700)   HYDROmorphone 4 mg/hr (05/25/2023 0700)   insulin Stopped (05/21/23 0754)   lactated ringers 10 mL/hr at 05/24/23 0532   meropenem (MERREM) IV Stopped (05/27/2023 0537)   midazolam 12 mg/hr (05/22/2023 0700)   norepinephrine (LEVOPHED) Adult infusion 10 mcg/min (05/30/2023 0700)   prismasol BGK 4/2.5 1,500 mL/hr at 05/23/2023 0549   vancomycin Stopped (05/25/23 2301)   vasopressin 0.03 Units/min (05/19/2023 0700)    PRN Medications: sodium chloride, sodium chloride, anticoagulant sodium citrate, artificial tears, dextrose, HYDROmorphone, midazolam, midazolam, morphine injection, ondansetron (ZOFRAN) IV, mouth rinse, oxyCODONE, sodium chloride flush, traMADol    Assessment/Plan   1. Post-cardiotomy vasoplegia/shock with failure to wean from CPB: Now on central VA ECMO (06/01/2023) with RA drainage/ascending aorta return. Post-op echo with EF 55%, mild-moderate RV dysfunction. Unable to get images yesterday by TTE.  TEE on 9/16 showed EF 30%, moderate LVH, moderate RV dysfunction with mild enlargement, stable bioprosthetic aortic valve, repaired TV with mild TR, no endocarditis noted.  MAP around 65-70 now on epinephrine 8 + NE 10 + vasopressin 0.03, pulsatile arterial line.  CXR with clearing left lung, right lung with persistent airspace disease not clearing with CVVH.  CVVH now running even and weight down.  CVP 6.  I decreased LVAD flow to 2.5 L/min today,  patient promptly became significantly hypotensive and we increased back to 3.2 L/min.  - Run CVVH even to slightly positive today, I think we have effectively diuresed him.  - Wean pressors as able.   - Continue bivalirudin today with goal PTT 50-70.  - Discussed with Drs Laneta Simmers and Icard, he is not ready to make transition from [facility] ECMO to VV ECMO alone.  He did not tolerate ECMO flow weaning today below 3 L/min.  We considered placement of Impella 5.5 with eventual transition to VV ECMO but right-sided access would be difficult given his permanent dialysis tunneled catheter in the right chest and left subclavian Impella 5.5 access would prevent use of the left subclavian vein for VV ECMO. No room for central placement of Impella 5.5. For now, plan will be to OR for washout today and TEE evaluation, probably leaving VA ECMO central cannulation.  2. Acute hypoxic respiratory failure, post-op: On vent, rest settings. Blender added to keep PaO2 down. Sweep remains 5.5. CXR with some clearing of left lung, right lung with ongoing diffuse airspace disease without change.  Suspect extensive PNA, think we have done what we can with CVVH for lung clearing at this point with CVP 6 and weight down.  - Keep CVVH even to slightly positive today.  - Meropenem/vancomycin for coverage of PNA 3. Severe prosthetic AI: Due to previous MSSA endocarditis with partial valve dehiscence s/p re-do AVR/Bentall with reimplantation of SVG-RCA and TV repair 05/24/2023.   - Continuing meropenem/vancomycin.  4. CAD s/p CABG/AVR 2017:  s/p AVR/Bentall and re-implantation of SVG-RCA 05/13/2023. - Off ASA with platelets down.  5. ESRD: CVVH as above.  6. DM2: management per CCM 7. Heart block: Post-op, now in stable junctional rhythm.   8. H/o CVA: Prior cerebellar CVA.  9. Thrombocytopenia: Post-op, 43 => 38 => 28 => 20 K today.  Suspect inflammatory and hemolysis.  - Will need platelet transfusion with OR washout today. 10. Anemia:  Post-op, transfuse hgb < 8. 9.1 today (improved).  11. ID: H/o MSSA bacteremia.  WBCs 54 => 53 => 45 => 48.7K now.  - Meropenem + vancomycin.   CRITICAL CARE Performed by: [name]  Total critical care time: 45 minutes  Critical care time was exclusive of separately billable procedures and treating other patients.  Critical care was necessary to treat or prevent imminent or life-threatening deterioration.  Critical care was time spent personally by me on the following activities: development of treatment plan with patient and/or surrogate as well as nursing, discussions with consultants, evaluation of patient's response to treatment, examination of patient, obtaining history from patient or surrogate, ordering and performing treatments and interventions, ordering and review of laboratory studies, ordering and review of radiographic studies, pulse oximetry and re-evaluation of patient's condition.    Length of Stay: 8  [name], MD  05/08/2023, 7:21 AM  Advanced Heart Failure Team Pager [phone] (M-F; 7a - 5p)  Please contact [facility] for night-coverage after hours (5p -7a ) and weekends on [email or web address]

## 2023-05-26 NOTE — Transfer of Care (Signed)
Immediate Anesthesia Transfer of Care Note  Patient: [name]  Procedure(s) Performed: MEDIASTINAL WASHOUT (Chest) INTRAOPERATIVE TRANSESOPHAGEAL ECHOCARDIOGRAM (Esophagus)  Patient Location: SICU  Anesthesia Type:General  Level of Consciousness: Patient remains intubated per anesthesia plan  Airway & Oxygen Therapy: Patient remains intubated per anesthesia plan  Post-op Assessment: Report given to RN and Post -op Vital signs reviewed and stable  Post vital signs: Reviewed and stable  Last Vitals:  Vitals Value Taken Time  BP 96/64 05/26/23 1100  Temp 36.5 C 05/26/23 1141  Pulse 95 05/26/23 1156  Resp 16 05/26/23 1156  SpO2 100 % 05/26/23 1156  Vitals shown include unfiled device data.  Last Pain:  Vitals:   05/26/23 0400  TempSrc: Core  PainSc:          Complications: No notable events documented.

## 2023-05-26 NOTE — Op Note (Signed)
  CARDIOVASCULAR SURGERY OPERATIVE NOTE  05/26/2023  Surgeon:  Alleen Borne, MD  First Assistant: RNFA   Preoperative Diagnosis:  VA ECMO s/p  aortic root replacement.   Postoperative Diagnosis:  Same   Procedure:  Mediastinal washout 2.   Insertion of left femoral arterial line for BP monitoring  Anesthesia:  General Endotracheal   Clinical History/Surgical Indication:  The patient is a 62 year old gentleman on HD with hx of bioprosthetic AVR and CABG in 2017 who presented with CHF and was found to have partial dehiscence of the aortic valve prosthesis and severe periprosthetic AI. He underwent redo sternotomy with removal of the old prosthesis and aortic root replacement using a Homograft valve conduit with reimplantation of the coronary arteries and extension of one of his vein grafts to the RCA and tricuspid valve annuloplasty for severe TR 6 days ago. He was taken back to the OR today for mediastinal washout and weaning of ECMO for possible decannulation or conversion to VV ECMO. I discussed the procedure with the patients son including alternatives, benefits and risks and he agreed to proceed and signed the consent.  Preparation:  The patient was taken directly back to the OR room from the ICU. He was already on broad spectrum antibiotics. After being placed under general endotracheal anesthesia by the anesthesia team the old dressings were removed and the neck, chest, abdomen, and both groins were prepped with betadine soap and solution and draped in the usual sterile manner. A surgical time-out was taken and the correct patient and operative procedure were confirmed with the nursing and anesthesia staff.  Insertion of left femoral arterial line:   The left brachial arterial line had a good tracing but it was difficult to draw blood from. Therefore I decided to insert a femoral line to  be sure we were making decisions with accurate BP readings. The left common femoral artery was cannulated using US guidance and micropuncture technique and a long femoral catheter inserted without difficulty. It was connected to the transducer and the tracing was good.   Mediastinal washout:  There was a moderate amount of clot present anterior to the RV and along the RA and this was removed. The chest tubes were de-clotted. We gradually turned the ECMO flow down to 1.5 l/min. TEE was used to evaluate the heart and the RV and LV function looked much better than it did on flow of 3 l/min. I suspect this was because the heart was more filled on lower flow. The aortic valve was functioning normally. There was no MR, trivial TR. We watch him for a while on 1.5 L flow and the MAP gradually decreased into the 50's despite good cardiac function. We though this was likely vasoplegia. We made the decision to continue central VA ECMO instead of converting to VV ECMO and escalating vasopressors. Hopefully with more time the vasoplegia will improve. The chest tubes were returned to the original positions. Hemostasis was complete. We resumed the Bivalirudin. He did receive 2 units of platelets due to marked thrombocytopenia in the 20's. An Esmark was sewn around the wound edges and an iodine impregnated sticky drape was applied over the chest.    All sponge, needle, and instrument counts were reported correct at the end of the case. The patient was then transported to the surgical intensive care unit in critical but stable condition.

## 2023-05-26 NOTE — Progress Notes (Signed)
PT Cancellation Note  Patient Details Name: [name] MRN: [id number] DOB: September 27, 1960   Cancelled Treatment:    Reason Eval/Treat Not Completed: Other (comment) (Pt will likely be sedated with open chest for days yet.  Will sign off and await a reorder.) 05/26/2023  Jacinto Halim., PT Acute Rehabilitation Services [phone]  (office)   Eliseo Gum Joevanni Roddey 05/26/2023, 11:14 AM

## 2023-05-26 NOTE — Progress Notes (Signed)
PT Cancellation Note  Patient Details Name: Doulgas Budnick MRN: [id number] DOB: [birth year]   Cancelled Treatment:    Reason Eval/Treat Not Completed: Patient at procedure or test/unavailable.  Will check back later and see pt as able/appropriate. 05/26/2023  Jacinto Halim., PT Acute Rehabilitation Services [phone]  (office)   Eliseo Gum Shashana Fullington 05/26/2023, 10:13 AM

## 2023-05-26 NOTE — Progress Notes (Signed)
Pt was transported from OR to 2h23 without complications.

## 2023-05-26 NOTE — Progress Notes (Addendum)
OT Cancellation Note  Patient Details Name: [name] MRN: [id number] DOB: 11/15/1960   Cancelled Treatment:    Reason Eval/Treat Not Completed: Patient at procedure or test/ unavailable.  Patient will remain sedated with open chest.  OT will await new orders when appropriate.    Lateria Alderman D Mischa Brittingham 05/26/2023, 7:56 AM 05/26/2023  RP, OTR/L  Acute Rehabilitation Services  Office:  [phone]

## 2023-05-26 NOTE — Progress Notes (Signed)
Patient ID: [name], male   DOB: [birth year], 62 y.o.   MRN: [id number]  Patient taken to OR for chest washout today.  I obtained TEE images in OR, initial LV EF in the 35% range diffusely with mild-moderate RV dysfunction.  After chest washout, TEE showed significantly increased LV function with EF in the 50-55% range and near-normal RV function.  Flow decreased to 1.5 L/min on VA ECMO with drop in MAP to 50s.  We increased pressors but were unable to get MAP beyond 60.  Suspect primarily vasoplegia/vasodilatory shock at this point.  I discussed situation again with Drs Laneta Simmers and Donata Clay.  Consideration has been made of VVECMO conversion with Impella 5.5, but patient does not have good access point for Impella 5.5 and with improved LV function and relatively small LV, do not think it would be helpful.  He is not able to come off VA ECMO currently due to vasodilatory shock.  Therefore, we turned ECMO flow back up to 3 L/min.  Will close skin but chest will remain open.  Patient will need more time to recover from vasoplegia.   CRITICAL CARE Performed by: [name]  Total critical care time: 60 minutes  Critical care time was exclusive of separately billable procedures and treating other patients.  Critical care was necessary to treat or prevent imminent or life-threatening deterioration.  Critical care was time spent personally by me on the following activities: development of treatment plan with patient and/or surrogate as well as nursing, discussions with consultants, evaluation of patient's response to treatment, examination of patient, obtaining history from patient or surrogate, ordering and performing treatments and interventions, ordering and review of laboratory studies, ordering and review of radiographic studies, pulse oximetry and re-evaluation of patient's condition.  [name] 05/26/2023 10:06 AM

## 2023-05-26 NOTE — Progress Notes (Signed)
ANTICOAGULATION CONSULT NOTE - Follow up Consult  Pharmacy Consult for bivalirudin Indication:  VA ECMO  Allergies  Allergen Reactions   Other Anaphylaxis    Mushrooms  Not listed on MAR    Plavix [Clopidogrel Bisulfate] Other (See Comments)    TTP Not listed on the [facility]   Fleet Enema [Enema] Other (See Comments)    Unknown reaction   Lovenox [Enoxaparin] Other (See Comments)    Unknown reaction   Morphine Other (See Comments)    Unknown reaction   Nsaids Other (See Comments)    Unknown reaction   Hydrocodone-Acetaminophen Itching    Patient Measurements: Height: 5\' 6"  (167.6 cm) Weight: 78.4 kg (172 lb 13.5 oz) IBW/kg (Calculated) : 63.8 Heparin Dosing Weight: 81.3 kg  Vital Signs: Temp: 97.5 F (36.4 C) (09/19 1715) Temp Source: Core (09/19 1600) BP: 92/64 (09/19 1501) Pulse Rate: 90 (09/19 1715)  Labs: Recent Labs    05/24/23 0411 05/24/23 0413 05/25/23 0412 05/25/23 0421 05/25/23 1703 05/25/23 2001 05/26/23 0404 05/26/23 0411 05/26/23 1151 05/26/23 1554 05/26/23 1603 05/26/23 1653  HGB 8.7*   < > 9.0*   < > 9.3*  10.2*   < > 9.1*   < > 7.4* 8.7* 9.2* 8.5*  HCT 25.3*   < > 27.3*   < > 28.6*  30.0*   < > 27.0*   < > 22.5* 26.1* 27.0* 25.0*  PLT 38*   < > 28*  --  24*  --  20*  --  45* 38*  --   --   APTT 66*   < > 59*  --  73*  --  68*  --   --  74*  --   --   LABPROT 20.7*  --  19.5*  --   --   --  18.6*  --   --   --   --   --   INR 1.8*  --  1.6*  --   --   --  1.5*  --   --   --   --   --   CREATININE 1.24   < > 1.19  --  1.14  --  1.14  --   --  1.33*  --   --    < > = values in this interval not displayed.    Estimated Creatinine Clearance: 56 mL/min (A) (by C-G formula based on SCr of 1.33 mg/dL (H)).   Assessment: 62 yom underwent Bentall/AVR, TV repair, re-implantation of SVG-RCA complicated with vasoplegia and pulmonary edema requiring central VA ECMO. No AC PTA.  aPTT slightly supratherapeutic at 74 sec on bival 0.015 mg/kg/hr. Of  note, infusion was paused briefly ~1hr during transit to OR. Also, CRRT was held ~5hr in [place], likely contributing to decreased clearance of drug. Hgb 9.5, plt increased from 20 > 38 after transfusion. LDH up slightly to 1426. Fibrinogen >800. CRRT now resumed. No circuit issues - no fibrin in oxygenator. No s/sx of bleeding.  Goal of Therapy:  aPTT 50-70 seconds Monitor platelets by anticoagulation protocol: Yes   Plan:  Decrease bivalirudin to 0.013 mg/kg/hr  Obtain 6-hr aPTT after rate decreased Monitor q12 hr aPTT and CBC, and for s/sx of bleeding.  Thank you for allowing pharmacy to participate in this patient's care,  Loralee Pacas, PharmD, BCPS 05/26/2023 5:33 PM  Please check AMION for all [facility] Pharmacy phone numbers After 10:00 PM, call Main Pharmacy [phone]    Update 05/26/23 1300:  Bivalirudin infusion  was paused briefly in transit to OR for washout and resumed promptly once procedure was started (~1 hour). Okay to continue bivalirudin at 0.015 mg/kg/hour and check next aPTT at 1700.  Wilmer Floor, PharmD PGY2 Cardiology Pharmacy Resident

## 2023-05-26 NOTE — Progress Notes (Signed)
EVENING ROUNDS NOTE :     [address]       [place] [address]             [phone]                 Day of Surgery Procedure(s) (LRB): MEDIASTINAL WASHOUT (N/A) INTRAOPERATIVE TRANSESOPHAGEAL ECHOCARDIOGRAM (N/A)   Total Length of Stay:  LOS: 8 days  Events:   No events Stable Low CT output    BP 92/64   Pulse 91   Temp 97.7 F (36.5 C)   Resp (!) 0   Ht 5\' 6"  (1.676 m)   Wt 78.4 kg   SpO2 100%   BMI 27.90 kg/m   PAP: (16-38)/(13-24) 31/20 CVP:  [0 mmHg-10 mmHg] 7 mmHg  Vent Mode: PRVC FiO2 (%):  [50 %] 50 % Set Rate:  [15 bmp-24 bmp] 24 bmp Vt Set:  [400 mL] 400 mL PEEP:  [5 cmH20] 5 cmH20 Plateau Pressure:  [26 cmH20-28 cmH20] 28 cmH20    prismasol BGK 4/2.5 400 mL/hr at 05/26/23 1130    prismasol BGK 4/2.5 400 mL/hr at 05/26/23 1130   sodium chloride Stopped (05/24/23 0032)   sodium chloride Stopped (05/22/23 1310)   sodium chloride 10 mL/hr at 05/26/23 0700   anticoagulant sodium citrate     bivalirudin (ANGIOMAX) 250 mg in sodium chloride 0.9 % 500 mL (0.5 mg/mL) infusion 0.013 mg/kg/hr (05/26/23 2100)   dexmedetomidine (PRECEDEX) IV infusion 0.7 mcg/kg/hr (05/26/23 2116)   epinephrine 9 mcg/min (05/26/23 2129)   HYDROmorphone 4 mg/hr (05/26/23 2100)   insulin Stopped (05/21/23 0754)   lactated ringers 10 mL/hr at 05/24/23 0532   meropenem (MERREM) IV 1 g (05/26/23 2107)   midazolam 11 mg/hr (05/26/23 2100)   norepinephrine (LEVOPHED) Adult infusion 12 mcg/min (05/26/23 2100)   prismasol BGK 4/2.5 1,500 mL/hr at 05/26/23 2103   vancomycin Stopped (05/25/23 2301)   vasopressin 0.03 Units/min (05/26/23 2100)    I/O last 3 completed shifts: In: 6470.8 [I.V.:3238.5; Blood:952; Other:360; NG/GT:300; IV Piggyback:1620.3] Out: 7451.7 [Urine:5; Emesis/NG output:2750; Stool:150; Blood:200; Chest Tube:260]      Latest Ref Rng & Units 05/26/2023    9:24 PM 05/26/2023    7:48 PM 05/26/2023    4:53 PM  CBC  Hemoglobin 13.0 - 17.0  g/dL 8.8  8.8  8.5   Hematocrit 39.0 - 52.0 % 26.0  26.0  25.0        Latest Ref Rng & Units 05/26/2023    9:24 PM 05/26/2023    7:48 PM 05/26/2023    4:53 PM  BMP  Sodium 135 - 145 mmol/L 136  136  135   Potassium 3.5 - 5.1 mmol/L 4.5  4.6  4.4     ABG    Component Value Date/Time   PHART 7.337 (L) 05/26/2023 2124   PCO2ART 45.0 05/26/2023 2124   PO2ART 127 (H) 05/26/2023 2124   HCO3 24.3 05/26/2023 2124   TCO2 26 05/26/2023 2124   ACIDBASEDEF 2.0 05/26/2023 2124   O2SAT 99 05/26/2023 2124       Brynda Greathouse, MD 05/26/2023 9:41 PM

## 2023-05-26 NOTE — Progress Notes (Addendum)
ANTICOAGULATION CONSULT NOTE - Follow up Consult  Pharmacy Consult for bivalirudin Indication:  VA ECMO  Allergies  Allergen Reactions   Other Anaphylaxis    Mushrooms  Not listed on MAR    Plavix [Clopidogrel Bisulfate] Other (See Comments)    TTP Not listed on the [facility]   Fleet Enema [Enema] Other (See Comments)    Unknown reaction   Lovenox [Enoxaparin] Other (See Comments)    Unknown reaction   Morphine Other (See Comments)    Unknown reaction   Nsaids Other (See Comments)    Unknown reaction   Hydrocodone-Acetaminophen Itching    Patient Measurements: Height: 5\' 6"  (167.6 cm) Weight: 78.4 kg (172 lb 13.5 oz) IBW/kg (Calculated) : 63.8 Heparin Dosing Weight: 81.3 kg  Vital Signs: Temp: 97.7 F (36.5 C) (09/19 0730) Temp Source: Core (09/19 0400) Pulse Rate: 81 (09/19 0730)  Labs: Recent Labs    05/24/23 0411 05/24/23 0413 05/25/23 0412 05/25/23 0421 05/25/23 1703 05/25/23 2001 05/26/23 0404 05/26/23 0411  HGB 8.7*   < > 9.0*   < > 9.3*  10.2* 10.2* 9.1* 9.5*  HCT 25.3*   < > 27.3*   < > 28.6*  30.0* 30.0* 27.0* 28.0*  PLT 38*   < > 28*  --  24*  --  20*  --   APTT 66*   < > 59*  --  73*  --  68*  --   LABPROT 20.7*  --  19.5*  --   --   --  18.6*  --   INR 1.8*  --  1.6*  --   --   --  1.5*  --   CREATININE 1.24   < > 1.19  --  1.14  --  1.14  --    < > = values in this interval not displayed.    Estimated Creatinine Clearance: 65.3 mL/min (by C-G formula based on SCr of 1.14 mg/dL).   Assessment: 62 yom underwent Bentall/AVR, TV repair, re-implantation of SVG-RCA complicated with vasoplegia and pulmonary edema requiring central VA ECMO. No AC PTA.  aPTT this morning is therapeutic at 68 sec. Hgb 9.5, plt 20. LDH up slightly to 1426. Fibrinogen >800. Remains on CRRT. No circuit issues - no fibrin in oxygenator. No s/sx of bleeding.  Plan to take patient to the OR for washout and give platelets during procedure. Bivalirudin will be stopped  immediately before transfer and will f/u for when to resume AC once back on floor.  Goal of Therapy:  aPTT 50-70 seconds Monitor platelets by anticoagulation protocol: Yes   Plan:  Continue bivalirudin at 0.015 mg/kg/hr and hold immediately before going to OR. Obtain 6-hr aPTT after bivalirudin is resumed. Monitor q12 hr aPTT and CBC, and for s/sx of bleeding.  Thank you for allowing pharmacy to participate in this patient's care,  Wilmer Floor, PharmD PGY2 Cardiology Pharmacy Resident 05/26/2023 9:14 AM  Please check AMION for all [facility] Pharmacy phone numbers After 10:00 PM, call Main Pharmacy [phone]    Update 05/26/23 1300:  Bivalirudin infusion was paused briefly in transit to OR for washout and resumed promptly once procedure was started (~1 hour). Okay to continue bivalirudin at 0.015 mg/kg/hour and check next aPTT at 1700.  Wilmer Floor, PharmD PGY2 Cardiology Pharmacy Resident

## 2023-05-26 NOTE — Progress Notes (Signed)
[name] NEPHROLOGY PROGRESS NOTE  Assessment/ Plan: 43M recently recovered GFR from dialysis dependent AKI with dehiscence  of prosthetic aortic value and severe AI/TR.   # Anuric AKI/dialysis dependent AKI with fluid overload: Started on RRT during previous admission in May 2024 until 05/10/23 (recovered). Patient is status post cardiac surgery on 9/13 and currently on ECMO. CRRT start 9/11. He remains anuric with fluid overload.  Continue to run CRRT at current prescription (resume post-op).  UF as tolerated by BP and ECMO, Angiomax for anticoagulation.  UFG: net even to net neg 50cc/hr. Discussed with ICU RN at bedside.  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  Per CTS  # AHRF: VDRF, per primary service. Abx per primary service to cover for PNA  # Postcardiotomy vasoplegia/shock with failure to wean: Currently on ECMO per HF team.  On pressors per primary service.  Volume managing with CRRT. OR for washout today with plans to likely return to [facility] ECMO postop  # Hyperkalemia: Managed with CRRT prescription and follow labs. K stable today  # Anemia: Transfuse as needed per primary team.  Discussed with ICU RN.  Subjective:  Currently off CRRT, going to OR. To resume CRRT postop. Pressors: epi, NE, vaso  Objective Vital signs in last 24 hours: Vitals:   05/26/23 0615 05/26/23 0630 05/26/23 0645 05/26/23 0700  BP:      Pulse: 93 95 92 95  Resp: 15 15 15 15   Temp: 97.7 F (36.5 C) (!) 97.5 F (36.4 C) (!) 97.5 F (36.4 C) (!) 97.5 F (36.4 C)  TempSrc:      SpO2: 99% 100% 99% 98%  Weight:      Height:       Weight change: -2.1 kg  Intake/Output Summary (Last 24 hours) at 05/26/2023 0744 Last data filed at 05/26/2023 0700 Gross per 24 hour  Intake 3829.13 ml  Output 6055.7 ml  Net -2226.57 ml       Labs: RENAL PANEL Recent Labs  Lab 05/23/23 1620  05/23/23 1725 05/24/23 0411 05/24/23 0413 05/24/23 1600 05/24/23 1602 05/25/23 0412 05/25/23 0421 05/25/23 1240 05/25/23 1703 05/25/23 2001 05/26/23 0404 05/26/23 0411  NA 137   < > 134*   < > 136   < > 138   < > 138 135  138 139 135 137  K 3.9   < > 3.8   < > 3.5   < > 3.4*   < > 4.3 4.0  4.1 4.1 4.0 4.1  CL 101  --  103  --  105  --  105  --   --  102  --  104  --   CO2 23  --  22  --  25  --  23  --   --  22  --  22  --   GLUCOSE 108*  --  115*  --  154*  --  225*  --   --  125*  --  118*  --   BUN 12  --  11  --  14  --  24*  --   --  23  --  21  --   CREATININE 1.26*  --  1.24  --  1.29*  --  1.19  --   --  1.14  --  1.14  --   CALCIUM 8.0*  --  8.1*  --  7.8*  --  8.1*  --   --  8.2*  --  8.2*  --   MG 2.4  --  2.4  --  2.5*  --  2.4  --   --   --   --  2.5*  --   PHOS 2.0*  2.1*  --  1.7*  --  1.7*  --  <1.0*  --   --  1.6*  --  1.7*  --   ALBUMIN 2.4*  --  2.4*  2.4*  --  2.3*  --  2.2*  2.2*  --   --  2.5*  --  2.3*  2.3*  --    < > = values in this interval not displayed.    Liver Function Tests: Recent Labs  Lab 05/24/23 0411 05/24/23 1600 05/25/23 0412 05/25/23 1703 05/26/23 0404  AST 68*  --  48*  --  64*  ALT 11  --  9  --  12  ALKPHOS 86  --  115  --  105  BILITOT 1.5*  --  1.2  --  1.5*  PROT 6.0*  --  5.9*  --  6.3*  ALBUMIN 2.4*  2.4*   < > 2.2*  2.2* 2.5* 2.3*  2.3*   < > = values in this interval not displayed.   No results for input(s): "LIPASE", "AMYLASE" in the last 168 hours. No results for input(s): "AMMONIA" in the last 168 hours. CBC: Recent Labs    08/12/22 0934 01/28/23 2355 05/17/23 2116 05/18/23 0411 05/19/23 0636 05/20/23 0309 05/25/23 1240 05/25/23 1703 05/25/23 2001 05/26/23 0404 05/26/23 0411  HGB 10.2*   < > 7.6*   < > 8.1*   < > 9.9* 9.3*  10.2* 10.2* 9.1* 9.5*  MCV 94   < >  --    < > 89.3   < >  --  89.4  --  89.4  --   VITAMINB12  --   --   --   --  701  --   --   --   --   --   --   FERRITIN 537*  --   2,200*  --   --   --   --   --   --   --   --   TIBC 264  --  204*  --   --   --   --   --   --   --   --   IRON 83  --  140  --   --   --   --   --   --   --   --    < > = values in this interval not displayed.    Cardiac Enzymes: No results for input(s): "CKTOTAL", "CKMB", "CKMBINDEX", "TROPONINI" in the last 168 hours. CBG: Recent Labs  Lab 05/25/23 1144 05/25/23 1700 05/25/23 1958 05/25/23 2312 05/26/23 0408  GLUCAP 182* 137* 105* 132* 126*    Iron Studies: No results for input(s): "IRON", "TIBC", "TRANSFERRIN", "FERRITIN" in the last 72 hours. Studies/Results: DG Abd Portable 1V  Result Date: 05/25/2023 CLINICAL DATA:  Check gastric catheter placement EXAM: PORTABLE ABDOMEN - 1 VIEW COMPARISON:  05/25/2023 FINDINGS: Postsurgical changes are noted. Multiple tubes and lines as well as ECMO cannula are noted. Bilateral thoracostomy catheters and a pericardial drain are seen. Weighted feeding catheter is identified coiled within the stomach with the distal tip directed towards the pylorus similar to that  seen on prior exam. IMPRESSION: Postsurgical changes with tubes and lines as described above. Feeding catheter is noted in the stomach directed towards the pylorus. The tip is not visualized on this exam. Electronically Signed   By: Alcide Clever M.D.   On: 05/25/2023 19:25   DG Abd Portable 1V  Result Date: 05/25/2023 CLINICAL DATA:  Gastric distention.  Feeding tube. EXAM: PORTABLE ABDOMEN - 1 VIEW COMPARISON:  05/23/2023.  Portable chest dated 05/23/2023. FINDINGS: Paucity of intestinal gas. No visible gastric or bowel distention. Mediastinal and epicardial drainage tubes. Feeding tube looped in the proximal stomach and extending distally in a vertical orientation with its tip at the lumbosacral junction on the right. Stable right ventricular ECMO catheter. Increased right basilar airspace opacity stable mild left basilar airspace opacity. Thoracic and lumbar spine degenerative changes.  IMPRESSION: 1. Feeding tube looped in the proximal stomach and extending distally in a vertical orientation with its tip at the lumbosacral junction on the right. 2. Paucity of intestinal gas. No visible gastric or bowel distention. 3. Increased right basilar atelectasis or pneumonia. Electronically Signed   By: Beckie Salts M.D.   On: 05/25/2023 10:44   DG CHEST PORT 1 VIEW  Result Date: 05/25/2023 CLINICAL DATA:  ECMO EXAM: PORTABLE CHEST 1 VIEW COMPARISON:  Chest radiograph 1 day prior FINDINGS: The endotracheal tube is in the lower thoracic trachea proximally 1.2 cm from the carina, unchanged. A right IJ vascular catheter, right and left chest tubes, left IJ Swan-Ganz catheter, enteric catheter, presumed mediastinal drains, and ECMO cannulae are stable in position. The cardiomediastinal silhouette is grossly stable. Extensive opacities throughout the right lung are overall similar to the prior study. There is no new or worsening focal airspace opacity. There are probable bilateral pleural effusions. There is no definite pneumothorax There is no acute osseous abnormality. IMPRESSION: 1. Endotracheal tube remains in the lower thoracic trachea. Consider retraction by approximately 1 cm. Additional support apparatus as above, stable. 2. Unchanged opacities throughout the right lung with probable small pleural effusions. No definite pneumothorax. Electronically Signed   By: Lesia Hausen M.D.   On: 05/25/2023 10:43    Medications: Infusions:   prismasol BGK 4/2.5 400 mL/hr at 05/26/23 0432    prismasol BGK 4/2.5 400 mL/hr at 05/26/23 0432   sodium chloride Stopped (05/24/23 0032)   sodium chloride Stopped (05/22/23 1310)   sodium chloride 10 mL/hr at 05/26/23 0700   anticoagulant sodium citrate     bivalirudin (ANGIOMAX) 250 mg in sodium chloride 0.9 % 500 mL (0.5 mg/mL) infusion 0.015 mg/kg/hr (05/26/23 0700)   dexmedetomidine (PRECEDEX) IV infusion 0.7 mcg/kg/hr (05/26/23 0700)   epinephrine 8  mcg/min (05/26/23 0700)   HYDROmorphone 4 mg/hr (05/26/23 0700)   insulin Stopped (05/21/23 0754)   lactated ringers 10 mL/hr at 05/24/23 0532   meropenem (MERREM) IV Stopped (05/26/23 0537)   midazolam 12 mg/hr (05/26/23 0700)   norepinephrine (LEVOPHED) Adult infusion 10 mcg/min (05/26/23 0700)   prismasol BGK 4/2.5 1,500 mL/hr at 05/26/23 0549   vancomycin Stopped (05/25/23 2301)   vasopressin 0.03 Units/min (05/26/23 0700)    Scheduled Medications:  sodium chloride   Intravenous Once   atorvastatin  80 mg Per Tube q1800   bisacodyl  10 mg Oral Daily   Or   bisacodyl  10 mg Rectal Daily   Chlorhexidine Gluconate Cloth  6 each Topical Daily   docusate  100 mg Per Tube BID   ezetimibe  10 mg Per Tube Daily  feeding supplement (PROSource TF20)  60 mL Per Tube Q4H   insulin aspart  0-15 Units Subcutaneous Q4H   insulin aspart  2 Units Subcutaneous Q4H   metoCLOPramide (REGLAN) injection  5 mg Intravenous Q6H   multivitamin  1 tablet Per Tube BID   mouth rinse  15 mL Mouth Rinse Q2H   pantoprazole (PROTONIX) IV  40 mg Intravenous QHS   polyethylene glycol  17 g Per Tube BID   sodium chloride flush  3 mL Intravenous Q12H    have reviewed scheduled and prn medications.  Physical Exam: General: Critically ill looking male, intubated, sedated, multiple lines Heart: S1S2 Lungs: Multiple lines and ECMO in the chest, diminished breath sounds bibasilar, intubated Abdomen:soft,  non-distended Extremities: no sig edema b/l Les Neuro: sedated Dialysis Access: LIJ HD line   Taila Basinski 05/26/2023,7:44 AM  LOS: 8 days

## 2023-05-27 ENCOUNTER — Inpatient Hospital Stay (HOSPITAL_COMMUNITY): Payer: PPO

## 2023-05-27 ENCOUNTER — Encounter (HOSPITAL_COMMUNITY): Payer: Self-pay | Admitting: Surgery

## 2023-05-27 DIAGNOSIS — R570 Cardiogenic shock: Secondary | ICD-10-CM | POA: Diagnosis not present

## 2023-05-27 DIAGNOSIS — Z515 Encounter for palliative care: Secondary | ICD-10-CM | POA: Diagnosis not present

## 2023-05-27 DIAGNOSIS — D649 Anemia, unspecified: Secondary | ICD-10-CM | POA: Diagnosis not present

## 2023-05-27 DIAGNOSIS — I509 Heart failure, unspecified: Secondary | ICD-10-CM | POA: Diagnosis not present

## 2023-05-27 LAB — POCT I-STAT 7, (LYTES, BLD GAS, ICA,H+H)
Acid-Base Excess: 0 mmol/L (ref 0.0–2.0)
Acid-Base Excess: 0 mmol/L (ref 0.0–2.0)
Acid-base deficit: 1 mmol/L (ref 0.0–2.0)
Acid-base deficit: 2 mmol/L (ref 0.0–2.0)
Acid-base deficit: 3 mmol/L — ABNORMAL HIGH (ref 0.0–2.0)
Acid-base deficit: 2 mmol/L (ref 0.0–2.0)
Acid-base deficit: 2 mmol/L (ref 0.0–2.0)
Acid-base deficit: 2 mmol/L (ref 0.0–2.0)
Acid-base deficit: 1 mmol/L (ref 0.0–2.0)
Bicarbonate: 22.5 mmol/L (ref 20.0–28.0)
Bicarbonate: 22.7 mmol/L (ref 20.0–28.0)
Bicarbonate: 23.6 mmol/L (ref 20.0–28.0)
Bicarbonate: 23.9 mmol/L (ref 20.0–28.0)
Bicarbonate: 24.2 mmol/L (ref 20.0–28.0)
Bicarbonate: 24.6 mmol/L (ref 20.0–28.0)
Bicarbonate: 24.6 mmol/L (ref 20.0–28.0)
Bicarbonate: 25.4 mmol/L (ref 20.0–28.0)
Bicarbonate: 26 mmol/L (ref 20.0–28.0)
Calcium, Ion: 1.04 mmol/L — ABNORMAL LOW (ref 1.15–1.40)
Calcium, Ion: 1.11 mmol/L — ABNORMAL LOW (ref 1.15–1.40)
Calcium, Ion: 1.13 mmol/L — ABNORMAL LOW (ref 1.15–1.40)
Calcium, Ion: 1.13 mmol/L — ABNORMAL LOW (ref 1.15–1.40)
Calcium, Ion: 1.13 mmol/L — ABNORMAL LOW (ref 1.15–1.40)
Calcium, Ion: 1.15 mmol/L (ref 1.15–1.40)
Calcium, Ion: 1.16 mmol/L (ref 1.15–1.40)
Calcium, Ion: 1.16 mmol/L (ref 1.15–1.40)
Calcium, Ion: 1.17 mmol/L (ref 1.15–1.40)
HCT: 24 % — ABNORMAL LOW (ref 39.0–52.0)
HCT: 25 % — ABNORMAL LOW (ref 39.0–52.0)
HCT: 25 % — ABNORMAL LOW (ref 39.0–52.0)
HCT: 25 % — ABNORMAL LOW (ref 39.0–52.0)
HCT: 26 % — ABNORMAL LOW (ref 39.0–52.0)
HCT: 26 % — ABNORMAL LOW (ref 39.0–52.0)
HCT: 26 % — ABNORMAL LOW (ref 39.0–52.0)
HCT: 27 % — ABNORMAL LOW (ref 39.0–52.0)
HCT: 32 % — ABNORMAL LOW (ref 39.0–52.0)
Hemoglobin: 10.9 g/dL — ABNORMAL LOW (ref 13.0–17.0)
Hemoglobin: 8.2 g/dL — ABNORMAL LOW (ref 13.0–17.0)
Hemoglobin: 8.5 g/dL — ABNORMAL LOW (ref 13.0–17.0)
Hemoglobin: 8.5 g/dL — ABNORMAL LOW (ref 13.0–17.0)
Hemoglobin: 8.5 g/dL — ABNORMAL LOW (ref 13.0–17.0)
Hemoglobin: 8.8 g/dL — ABNORMAL LOW (ref 13.0–17.0)
Hemoglobin: 8.8 g/dL — ABNORMAL LOW (ref 13.0–17.0)
Hemoglobin: 8.8 g/dL — ABNORMAL LOW (ref 13.0–17.0)
Hemoglobin: 9.2 g/dL — ABNORMAL LOW (ref 13.0–17.0)
O2 Saturation: 100 %
O2 Saturation: 100 %
O2 Saturation: 93 %
O2 Saturation: 98 %
O2 Saturation: 98 %
O2 Saturation: 99 %
O2 Saturation: 99 %
O2 Saturation: 99 %
O2 Saturation: 99 %
Patient temperature: 36.3
Patient temperature: 36.3
Patient temperature: 36.4
Patient temperature: 36.4
Patient temperature: 36.5
Patient temperature: 36.5
Patient temperature: 36.5
Patient temperature: 36.5
Patient temperature: 36.6
Potassium: 3.7 mmol/L (ref 3.5–5.1)
Potassium: 3.8 mmol/L (ref 3.5–5.1)
Potassium: 3.8 mmol/L (ref 3.5–5.1)
Potassium: 4 mmol/L (ref 3.5–5.1)
Potassium: 4.1 mmol/L (ref 3.5–5.1)
Potassium: 4.2 mmol/L (ref 3.5–5.1)
Potassium: 4.3 mmol/L (ref 3.5–5.1)
Potassium: 4.4 mmol/L (ref 3.5–5.1)
Potassium: 4.4 mmol/L (ref 3.5–5.1)
Sodium: 135 mmol/L (ref 135–145)
Sodium: 136 mmol/L (ref 135–145)
Sodium: 136 mmol/L (ref 135–145)
Sodium: 136 mmol/L (ref 135–145)
Sodium: 136 mmol/L (ref 135–145)
Sodium: 136 mmol/L (ref 135–145)
Sodium: 138 mmol/L (ref 135–145)
Sodium: 139 mmol/L (ref 135–145)
Sodium: 139 mmol/L (ref 135–145)
TCO2: 23 mmol/L (ref 22–32)
TCO2: 24 mmol/L (ref 22–32)
TCO2: 25 mmol/L (ref 22–32)
TCO2: 25 mmol/L (ref 22–32)
TCO2: 26 mmol/L (ref 22–32)
TCO2: 26 mmol/L (ref 22–32)
TCO2: 26 mmol/L (ref 22–32)
TCO2: 27 mmol/L (ref 22–32)
TCO2: 27 mmol/L (ref 22–32)
pCO2 arterial: 31.7 mmHg — ABNORMAL LOW (ref 32–48)
pCO2 arterial: 40.6 mmHg (ref 32–48)
pCO2 arterial: 41.3 mmHg (ref 32–48)
pCO2 arterial: 43.2 mmHg (ref 32–48)
pCO2 arterial: 44.3 mmHg (ref 32–48)
pCO2 arterial: 44.4 mmHg (ref 32–48)
pCO2 arterial: 45.2 mmHg (ref 32–48)
pCO2 arterial: 46.5 mmHg (ref 32–48)
pCO2 arterial: 47.5 mmHg (ref 32–48)
pH, Arterial: 7.33 — ABNORMAL LOW (ref 7.35–7.45)
pH, Arterial: 7.336 — ABNORMAL LOW (ref 7.35–7.45)
pH, Arterial: 7.344 — ABNORMAL LOW (ref 7.35–7.45)
pH, Arterial: 7.345 — ABNORMAL LOW (ref 7.35–7.45)
pH, Arterial: 7.351 (ref 7.35–7.45)
pH, Arterial: 7.353 (ref 7.35–7.45)
pH, Arterial: 7.355 (ref 7.35–7.45)
pH, Arterial: 7.37 (ref 7.35–7.45)
pH, Arterial: 7.457 — ABNORMAL HIGH (ref 7.35–7.45)
pO2, Arterial: 105 mmHg (ref 83–108)
pO2, Arterial: 109 mmHg — ABNORMAL HIGH (ref 83–108)
pO2, Arterial: 134 mmHg — ABNORMAL HIGH (ref 83–108)
pO2, Arterial: 154 mmHg — ABNORMAL HIGH (ref 83–108)
pO2, Arterial: 173 mmHg — ABNORMAL HIGH (ref 83–108)
pO2, Arterial: 174 mmHg — ABNORMAL HIGH (ref 83–108)
pO2, Arterial: 193 mmHg — ABNORMAL HIGH (ref 83–108)
pO2, Arterial: 254 mmHg — ABNORMAL HIGH (ref 83–108)
pO2, Arterial: 69 mmHg — ABNORMAL LOW (ref 83–108)

## 2023-05-27 LAB — TYPE AND SCREEN
ABO/RH(D): B POS
Antibody Screen: NEGATIVE
Unit division: 0
Unit division: 0
Unit division: 0
Unit division: 0
Unit division: 0
Unit division: 0

## 2023-05-27 LAB — HEPATIC FUNCTION PANEL
ALT: 11 U/L (ref 0–44)
AST: 68 U/L — ABNORMAL HIGH (ref 15–41)
Albumin: 2.2 g/dL — ABNORMAL LOW (ref 3.5–5.0)
Alkaline Phosphatase: 85 U/L (ref 38–126)
Bilirubin, Direct: 0.8 mg/dL — ABNORMAL HIGH (ref 0.0–0.2)
Indirect Bilirubin: 0.7 mg/dL (ref 0.3–0.9)
Total Bilirubin: 1.5 mg/dL — ABNORMAL HIGH (ref 0.3–1.2)
Total Protein: 5.8 g/dL — ABNORMAL LOW (ref 6.5–8.1)

## 2023-05-27 LAB — CBC
HCT: 25.6 % — ABNORMAL LOW (ref 39.0–52.0)
HCT: 24.8 % — ABNORMAL LOW (ref 39.0–52.0)
Hemoglobin: 8.4 g/dL — ABNORMAL LOW (ref 13.0–17.0)
Hemoglobin: 8.1 g/dL — ABNORMAL LOW (ref 13.0–17.0)
MCH: 29.5 pg (ref 26.0–34.0)
MCH: 30.1 pg (ref 26.0–34.0)
MCHC: 32.8 g/dL (ref 30.0–36.0)
MCHC: 32.7 g/dL (ref 30.0–36.0)
MCV: 89.8 fL (ref 80.0–100.0)
MCV: 92.2 fL (ref 80.0–100.0)
Platelets: 24 10*3/uL — CL (ref 150–400)
Platelets: 19 10*3/uL — CL (ref 150–400)
RBC: 2.85 MIL/uL — ABNORMAL LOW (ref 4.22–5.81)
RBC: 2.69 MIL/uL — ABNORMAL LOW (ref 4.22–5.81)
RDW: 18.8 % — ABNORMAL HIGH (ref 11.5–15.5)
RDW: 18.7 % — ABNORMAL HIGH (ref 11.5–15.5)
WBC: 48 10*3/uL — ABNORMAL HIGH (ref 4.0–10.5)
WBC: 47.1 10*3/uL — ABNORMAL HIGH (ref 4.0–10.5)
nRBC: 0.8 % — ABNORMAL HIGH (ref 0.0–0.2)
nRBC: 0.7 % — ABNORMAL HIGH (ref 0.0–0.2)

## 2023-05-27 LAB — PROTIME-INR
INR: 1.6 — ABNORMAL HIGH (ref 0.8–1.2)
Prothrombin Time: 19.3 seconds — ABNORMAL HIGH (ref 11.4–15.2)

## 2023-05-27 LAB — GLUCOSE, CAPILLARY
Glucose-Capillary: 110 mg/dL — ABNORMAL HIGH (ref 70–99)
Glucose-Capillary: 112 mg/dL — ABNORMAL HIGH (ref 70–99)
Glucose-Capillary: 119 mg/dL — ABNORMAL HIGH (ref 70–99)
Glucose-Capillary: 127 mg/dL — ABNORMAL HIGH (ref 70–99)
Glucose-Capillary: 97 mg/dL (ref 70–99)
Glucose-Capillary: 97 mg/dL (ref 70–99)

## 2023-05-27 LAB — BPAM PLATELET PHERESIS
Blood Product Expiration Date: 202409192359
Blood Product Expiration Date: 202409192359
ISSUE DATE / TIME: 202409190736
ISSUE DATE / TIME: 202409190736
Unit Type and Rh: 5100
Unit Type and Rh: 6200

## 2023-05-27 LAB — BPAM RBC
Blood Product Expiration Date: 202410072359
Blood Product Expiration Date: 202410072359
Blood Product Expiration Date: 202410072359
Blood Product Expiration Date: 202410102359
Blood Product Expiration Date: 202410142359
Blood Product Expiration Date: 202410202359
ISSUE DATE / TIME: 202409172013
ISSUE DATE / TIME: 202409190607
ISSUE DATE / TIME: 202409190607
ISSUE DATE / TIME: 202409190607
ISSUE DATE / TIME: 202409191243
Unit Type and Rh: 7300
Unit Type and Rh: 7300
Unit Type and Rh: 7300
Unit Type and Rh: 7300
Unit Type and Rh: 7300
Unit Type and Rh: 7300

## 2023-05-27 LAB — RENAL FUNCTION PANEL
Albumin: 2.2 g/dL — ABNORMAL LOW (ref 3.5–5.0)
Albumin: 2 g/dL — ABNORMAL LOW (ref 3.5–5.0)
Anion gap: 14 (ref 5–15)
Anion gap: 11 (ref 5–15)
BUN: 17 mg/dL (ref 8–23)
BUN: 14 mg/dL (ref 8–23)
CO2: 23 mmol/L (ref 22–32)
CO2: 23 mmol/L (ref 22–32)
Calcium: 8.2 mg/dL — ABNORMAL LOW (ref 8.9–10.3)
Calcium: 8.1 mg/dL — ABNORMAL LOW (ref 8.9–10.3)
Chloride: 99 mmol/L (ref 98–111)
Chloride: 103 mmol/L (ref 98–111)
Creatinine, Ser: 1.2 mg/dL (ref 0.61–1.24)
Creatinine, Ser: 1.36 mg/dL — ABNORMAL HIGH (ref 0.61–1.24)
GFR, Estimated: >60 mL/min (ref >60)
GFR, Estimated: 58 mL/min — ABNORMAL LOW (ref >60)
Glucose, Bld: 107 mg/dL — ABNORMAL HIGH (ref 70–99)
Glucose, Bld: 114 mg/dL — ABNORMAL HIGH (ref 70–99)
Phosphorus: 2.2 mg/dL — ABNORMAL LOW (ref 2.5–4.6)
Phosphorus: 2.5 mg/dL (ref 2.5–4.6)
Potassium: 4.3 mmol/L (ref 3.5–5.1)
Potassium: 4 mmol/L (ref 3.5–5.1)
Sodium: 136 mmol/L (ref 135–145)
Sodium: 137 mmol/L (ref 135–145)

## 2023-05-27 LAB — APTT
aPTT: 73 seconds — ABNORMAL HIGH (ref 24–36)
aPTT: 78 seconds — ABNORMAL HIGH (ref 24–36)
aPTT: 77 seconds — ABNORMAL HIGH (ref 24–36)

## 2023-05-27 LAB — PREPARE PLATELET PHERESIS
Unit division: 0
Unit division: 0

## 2023-05-27 LAB — FIBRINOGEN: Fibrinogen: 777 mg/dL — ABNORMAL HIGH (ref 210–475)

## 2023-05-27 LAB — LACTATE DEHYDROGENASE: LDH: 1295 U/L — ABNORMAL HIGH (ref 98–192)

## 2023-05-27 LAB — MAGNESIUM: Magnesium: 2.3 mg/dL (ref 1.7–2.4)

## 2023-05-27 LAB — LACTIC ACID, PLASMA: Lactic Acid, Venous: 1.6 mmol/L (ref 0.5–1.9)

## 2023-05-27 LAB — HEMOGLOBIN AND HEMATOCRIT, BLOOD
HCT: 23.1 % — ABNORMAL LOW (ref 39.0–52.0)
Hemoglobin: 7.7 g/dL — ABNORMAL LOW (ref 13.0–17.0)

## 2023-05-27 LAB — PREPARE RBC (CROSSMATCH)

## 2023-05-27 MED ORDER — METHYLENE BLUE (ANTIDOTE) 1 % IV SOLN
1.0000 mg/kg | Freq: Once | Status: AC
  Administered 2023-05-27: 84 mg via INTRAVENOUS
  Filled 2023-05-27: qty 8.4

## 2023-05-27 MED ORDER — SODIUM CHLORIDE 0.9 % IV SOLN
200.0000 mg | INTRAVENOUS | Status: DC
  Administered 2023-05-27 – 2023-06-04 (×9): 200 mg via INTRAVENOUS
  Filled 2023-05-27 (×9): qty 10

## 2023-05-27 MED ORDER — ALBUMIN HUMAN 5 % IV SOLN
12.5000 g | INTRAVENOUS | Status: DC | PRN
  Administered 2023-05-27 – 2023-05-30 (×2): 12.5 g via INTRAVENOUS
  Filled 2023-05-27 (×4): qty 250

## 2023-05-27 MED ORDER — METOCLOPRAMIDE HCL 5 MG/ML IJ SOLN
10.0000 mg | Freq: Once | INTRAMUSCULAR | Status: AC
  Administered 2023-05-27: 10 mg via INTRAVENOUS
  Filled 2023-05-27: qty 2

## 2023-05-27 MED ORDER — PIVOT 1.5 CAL PO LIQD
1000.0000 mL | ORAL | Status: DC
  Administered 2023-05-27 – 2023-05-28 (×2): 1000 mL

## 2023-05-27 MED ORDER — SODIUM PHOSPHATES 45 MMOLE/15ML IV SOLN
15.0000 mmol | Freq: Once | INTRAVENOUS | Status: AC
  Administered 2023-05-27: 15 mmol via INTRAVENOUS
  Filled 2023-05-27: qty 5

## 2023-05-27 MED ORDER — SODIUM CHLORIDE 0.9% IV SOLUTION: Freq: Once | INTRAVENOUS | Status: DC

## 2023-05-27 NOTE — Progress Notes (Signed)
ANTICOAGULATION CONSULT NOTE - Follow up Consult  Pharmacy Consult for bivalirudin Indication:  VA ECMO  Allergies  Allergen Reactions   Other Anaphylaxis    Mushrooms  Not listed on MAR    Plavix [Clopidogrel Bisulfate] Other (See Comments)    TTP Not listed on the [facility]   Fleet Enema [Enema] Other (See Comments)    Unknown reaction   Lovenox [Enoxaparin] Other (See Comments)    Unknown reaction   Morphine Other (See Comments)    Unknown reaction   Nsaids Other (See Comments)    Unknown reaction   Hydrocodone-Acetaminophen Itching    Patient Measurements: Height: 5\' 6"  (167.6 cm) Weight: 84 kg (185 lb 3 oz) IBW/kg (Calculated) : 63.8 Heparin Dosing Weight: 81.3 kg  Vital Signs: Temp: 97.9 F (36.6 C) (09/20 2230) Temp Source: Core (09/20 2000) BP: 92/70 (09/20 1120) Pulse Rate: 82 (09/20 2230)  Labs: Recent Labs    05/25/23 0412 05/25/23 0421 05/26/23 0404 05/26/23 0411 05/26/23 1554 05/26/23 1603 05/27/23 0307 05/27/23 0315 05/27/23 0432 05/27/23 0438 05/27/23 1611 05/27/23 1619 05/27/23 2204 05/27/23 2206  HGB 9.0*   < > 9.1*   < > 8.7*   < > 8.4*   < >  --    < > 8.1* 8.8* 7.7* 8.2*  HCT 27.3*   < > 27.0*   < > 26.1*   < > 25.6*   < >  --    < > 24.8* 26.0* 23.1* 24.0*  PLT 28*   < > 20*   < > 38*  --  24*  --   --   --  19*  --   --   --   APTT 59*   < > 68*  --  74*   < >  --   --  73*  --  78*  --  77*  --   LABPROT 19.5*  --  18.6*  --   --   --   --   --  19.3*  --   --   --   --   --   INR 1.6*  --  1.5*  --   --   --   --   --  1.6*  --   --   --   --   --   CREATININE 1.19   < > 1.14  --  1.33*  --  1.20  --   --   --  1.36*  --   --   --    < > = values in this interval not displayed.    Estimated Creatinine Clearance: 56.5 mL/min (A) (by C-G formula based on SCr of 1.36 mg/dL (H)).   Assessment: 62 yom underwent Bentall/AVR, TV repair, re-implantation of SVG-RCA complicated with vasoplegia and pulmonary edema requiring central VA ECMO.   No AC PTA.  aPTT supratherapeutic (78 sec) and up despite rate decrease to bival 0.0115 mg/kg/hr.  Continues on CRRT, no bleeding noted.    9/20 PM Update: aPTT remains supratherapeutic (77 sec).  No bleeding or oozing noted.   Goal of Therapy:  aPTT 50-70 seconds Monitor platelets by anticoagulation protocol: Yes   Plan:  Reduce bivalirudin to 0.008 mg/kg/hr  Monitor q12 hr aPTT and CBC, and for s/sx of bleeding.  Trixie Rude, PharmD Clinical Pharmacist 05/27/2023  10:39 PM

## 2023-05-27 NOTE — Progress Notes (Signed)
Patient ID: [name], male   DOB: 1961/06/13, 62 y.o.   MRN: [id number]   Extracorporeal support note   ECLS support day: Indication: Post-op vasoplegia and pulmonary edema  Configuration: Central VA ECMO  Drainage cannula: RA Return cannula: Ascending aorta  Pump speed: 2950 rpm  Pump flow: 3.2 L/min Pump used: Cardiohelp  Sweep gas: 4 with blender to lower PaO2  Circuit check: No fibrin Anticoagulant: Bivalirudin Anticoagulation targets: PTT 50-70  Changes in support: none  Anticipated goals/duration of support: Wean to decannulation  .[name]  05/27/2023, 7:24 AM

## 2023-05-27 NOTE — Procedures (Signed)
Bronchoscopy Procedure Note  Koji Grief  [id number]  05-28-1961  Date:05/27/23  Time:8:36 AM   Provider Performing:Rayni Nemitz C Katrinka Blazing   Procedure(s):  Flexible bronchoscopy with bronchial alveolar lavage (91478)  Indication(s) Nonresolving PNA  Consent Part of ECMO consent  Anesthesia In place for mechanical ventilation   Time Out Verified patient identification, verified procedure, site/side was marked, verified correct patient position, special equipment/implants available, medications/allergies/relevant history reviewed, required imaging and test results available.   Sterile Technique Usual hand hygiene, masks, gowns, and gloves were used   Procedure Description Bronchoscope advanced through endotracheal tube and into airway.  Airways were examined down to subsegmental level with findings noted below.   Following diagnostic evaluation, BAL(s) performed in RLL with normal saline and return of tan colored fluid (see photo)  Findings:  - Mildly irritated airways - ETT in good position - Copious secretions in R   Complications/Tolerance None; patient tolerated the procedure well. Chest X-ray is not needed post procedure.   EBL Minimal   Specimen(s) RLL BAL: fungal and bacterial cultures

## 2023-05-27 NOTE — Progress Notes (Signed)
[facility] NEPHROLOGY PROGRESS NOTE  Assessment/ Plan: 71M recently recovered GFR from dialysis dependent AKI with dehiscence  of prosthetic aortic value and severe AI/TR.   # Anuric AKI/dialysis dependent AKI with fluid overload: Started on RRT during previous admission in May 2024 until 05/10/23 (recovered). Patient is status post cardiac surgery on 9/13 and currently on ECMO. CRRT start 9/11. He remains anuric with fluid overload.  Continue to run CRRT at current prescription (resume post-op).  UF as tolerated by BP and ECMO, Angiomax for anticoagulation.  UFG: net even. Discussed with ICU RN at bedside.  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  Per CTS  # AHRF: VDRF, per primary service. Abx per primary service to cover for PNA  # Postcardiotomy vasoplegia/shock with failure to wean: Currently on ECMO per HF team.  On pressors per primary service.  Volume managing with CRRT. S/p washout 9/19  # Hyperkalemia: Managed with CRRT prescription and follow labs. K WNL  # Anemia: Transfuse as needed per primary team.  Discussed with ICU RN.  Subjective:  Patient seen and examined on CRRT. Running net even. S/p chest washout yesterday Pressors: epi, NE, vaso Net pos 1.1L  Objective Vital signs in last 24 hours: Vitals:   05/27/23 0815 05/27/23 0823 05/27/23 0830 05/27/23 0845  BP:      Pulse: 82  95 95  Resp: 14 15 (!) 21 (!) 0  Temp: 97.7 F (36.5 C)  (!) 97.5 F (36.4 C) 97.7 F (36.5 C)  TempSrc:      SpO2: 100%  100% 99%  Weight:      Height:       Weight change: 5.6 kg  Intake/Output Summary (Last 24 hours) at 05/27/2023 0940 Last data filed at 05/27/2023 0900 Gross per 24 hour  Intake 3962.93 ml  Output 3358 ml  Net 604.93 ml       Labs: RENAL PANEL Recent Labs  Lab 05/24/23 0411 05/24/23 0413 05/24/23 1600 05/24/23 1602 05/25/23 0412 05/25/23 0421  05/25/23 1703 05/25/23 2001 05/26/23 0404 05/26/23 0411 05/26/23 1554 05/26/23 1603 05/27/23 0051 05/27/23 0307 05/27/23 0315 05/27/23 0438 05/27/23 0813  NA 134*   < > 136   < > 138   < > 135  138   < > 135   < > 135   < > 136 136 136 136 136  K 3.8   < > 3.5   < > 3.4*   < > 4.0  4.1   < > 4.0   < > 4.5   < > 4.4 4.3 4.3 4.4 4.0  CL 103  --  105  --  105  --  102  --  104  --  102  --   --  99  --   --   --   CO2 22  --  25  --  23  --  22  --  22  --  22  --   --  23  --   --   --   GLUCOSE 115*  --  154*  --  225*  --  125*  --  118*  --  127*  --   --  107*  --   --   --   BUN 11  --  14  --  24*  --  23  --  21  --  20  --   --  17  --   --   --   CREATININE 1.24  --  1.29*  --  1.19  --  1.14  --  1.14  --  1.33*  --   --  1.20  --   --   --   CALCIUM 8.1*  --  7.8*  --  8.1*  --  8.2*  --  8.2*  --  7.8*  --   --  8.2*  --   --   --   MG 2.4  --  2.5*  --  2.4  --   --   --  2.5*  --   --   --   --  2.3  --   --   --   PHOS 1.7*  --  1.7*  --  <1.0*  --  1.6*  --  1.7*  --  3.6  --   --  2.2*  --   --   --   ALBUMIN 2.4*  2.4*  --  2.3*  --  2.2*  2.2*  --  2.5*  --  2.3*  2.3*  --  2.2*  --   --  2.2*  2.2*  --   --   --    < > = values in this interval not displayed.    Liver Function Tests: Recent Labs  Lab 05/25/23 0412 05/25/23 1703 05/26/23 0404 05/26/23 1554 05/27/23 0307  AST 48*  --  64*  --  68*  ALT 9  --  12  --  11  ALKPHOS 115  --  105  --  85  BILITOT 1.2  --  1.5*  --  1.5*  PROT 5.9*  --  6.3*  --  5.8*  ALBUMIN 2.2*  2.2*   < > 2.3*  2.3* 2.2* 2.2*  2.2*   < > = values in this interval not displayed.   No results for input(s): "LIPASE", "AMYLASE" in the last 168 hours. No results for input(s): "AMMONIA" in the last 168 hours. CBC: Recent Labs    08/12/22 0934 01/28/23 2355 05/17/23 2116 05/18/23 0411 05/19/23 0636 05/20/23 0309 05/26/23 1554 05/26/23 1603 05/27/23 0051 05/27/23 0307 05/27/23 0315 05/27/23 0438 05/27/23 0813   HGB 10.2*   < > 7.6*   < > 8.1*   < > 8.7*   < > 8.5* 8.4* 9.2* 8.8* 8.8*  MCV 94   < >  --    < > 89.3   < > 90.3  --   --  89.8  --   --   --   VITAMINB12  --   --   --   --  701  --   --   --   --   --   --   --   --   FERRITIN 537*  --  2,200*  --   --   --   --   --   --   --   --   --   --   TIBC 264  --  204*  --   --   --   --   --   --   --   --   --   --   IRON 83  --  140  --   --   --   --   --   --   --   --   --   --    < > =  values in this interval not displayed.    Cardiac Enzymes: No results for input(s): "CKTOTAL", "CKMB", "CKMBINDEX", "TROPONINI" in the last 168 hours. CBG: Recent Labs  Lab 05/26/23 1131 05/26/23 1600 05/26/23 1946 05/26/23 2320 05/27/23 0312  GLUCAP 103* 128* 117* 142* 110*    Iron Studies: No results for input(s): "IRON", "TIBC", "TRANSFERRIN", "FERRITIN" in the last 72 hours. Studies/Results: DG CHEST PORT 1 VIEW  Result Date: 05/27/2023 CLINICAL DATA:  ECMO EXAM: PORTABLE CHEST 1 VIEW COMPARISON:  Chest radiograph 1 day prior FINDINGS: The endotracheal tube tip is proximally 2.3 cm from the carina. The left IJ Swan-Ganz catheter terminates in the pulmonary outflow tract. The right IJ vascular catheter terminates in the region of the cavoatrial junction. The enteric catheter tip is likely in the region of the GE junction. Bilateral chest tubes and presumed mediastinal drains as well as ECMO cannula appear overall stable. The cardiomediastinal silhouette is stable Extensive airspace opacities in the right lung and to a lesser degree in the left upper lobe are overall unchanged. There is no pneumothorax There is no acute osseous abnormality. IMPRESSION: 1. Enteric catheter tip likely in the region of the GE junction. Recommend advancement. 2. Additional support apparatus as above is overall stable. 3. Extensive airspace opacities in the right lung and to a lesser degree in the left upper lobe are overall unchanged. Electronically Signed   By: Lesia Hausen M.D.   On: 05/27/2023 08:40   DG CHEST PORT 1 VIEW  Result Date: 05/26/2023 CLINICAL DATA:  Orogastric tube placement.  ECMO. EXAM: PORTABLE CHEST 1 VIEW COMPARISON:  05/26/2023 and 05/25/2023 FINDINGS: The endotracheal and feeding tubes have been removed. Orogastric tube with its tip in the distal esophagus. This could be advanced 15 cm place it in the mid stomach. The other tubes and catheters are unchanged. Stable enlarged cardiac silhouette. Dense air space consolidation in the right lung with mild progression. Mildly improved aeration of the left lung. Lower thoracic spine degenerative changes. IMPRESSION: 1. Orogastric tube with its tip in the distal esophagus. This could be advanced 15 cm place it in the mid stomach. 2. Mild progression of dense air space consolidation in the right lung. 3. Mildly improved aeration of the left lung. These results will be called to the ordering clinician or representative by the Radiologist Assistant, and communication documented in the PACS or [employer]. Electronically Signed   By: Beckie Salts M.D.   On: 05/26/2023 15:30   DG Abd 1 View  Result Date: 05/26/2023 CLINICAL DATA:  Orogastric tube placement EXAM: ABDOMEN - 1 VIEW COMPARISON:  KUB 1 day prior FINDINGS: The orogastric tube tip is at the level of the GE junction and could be advanced 2-3 cm. ECMO cannula, chest tubes, and presumed mediastinal drains are again noted which appear overall stable. There is a paucity of bowel gas in the imaged abdomen. There is no definite free intraperitoneal air. IMPRESSION: Enteric catheter tip at the level of the GE junction. This could be advanced 2-3 cm for better placement. Electronically Signed   By: Lesia Hausen M.D.   On: 05/26/2023 14:13   DG CHEST PORT 1 VIEW  Result Date: 05/26/2023 CLINICAL DATA:  ECMO. EXAM: PORTABLE CHEST 1 VIEW COMPARISON:  May 25, 2023. FINDINGS: Endotracheal and nasogastric tubes are in grossly good position. Feeding tube  is seen entering stomach. Left internal jugular Swan-Ganz catheter is noted directed into right pulmonary artery. Right internal jugular dialysis catheter is unchanged. Bilateral chest tubes  are noted without definite pneumothorax. Stable bilateral lung opacities are noted, right greater than left. IMPRESSION: Stable support apparatus. Stable bilateral lung opacities, right greater than left. Electronically Signed   By: Lupita Raider M.D.   On: 05/26/2023 10:16   DG Abd Portable 1V  Result Date: 05/25/2023 CLINICAL DATA:  Check gastric catheter placement EXAM: PORTABLE ABDOMEN - 1 VIEW COMPARISON:  05/25/2023 FINDINGS: Postsurgical changes are noted. Multiple tubes and lines as well as ECMO cannula are noted. Bilateral thoracostomy catheters and a pericardial drain are seen. Weighted feeding catheter is identified coiled within the stomach with the distal tip directed towards the pylorus similar to that seen on prior exam. IMPRESSION: Postsurgical changes with tubes and lines as described above. Feeding catheter is noted in the stomach directed towards the pylorus. The tip is not visualized on this exam. Electronically Signed   By: Alcide Clever M.D.   On: 05/25/2023 19:25    Medications: Infusions:   prismasol BGK 4/2.5 400 mL/hr at 05/26/23 2346    prismasol BGK 4/2.5 400 mL/hr at 05/26/23 2346   sodium chloride Stopped (05/24/23 0032)   sodium chloride Stopped (05/22/23 1310)   sodium chloride 10 mL/hr at 05/26/23 0700   anticoagulant sodium citrate     bivalirudin (ANGIOMAX) 250 mg in sodium chloride 0.9 % 500 mL (0.5 mg/mL) infusion 0.0115 mg/kg/hr (05/27/23 0900)   dexmedetomidine (PRECEDEX) IV infusion 0.7 mcg/kg/hr (05/27/23 0900)   epinephrine 9 mcg/min (05/27/23 0900)   HYDROmorphone 4 mg/hr (05/27/23 0900)   insulin Stopped (05/21/23 0754)   lactated ringers 10 mL/hr at 05/27/23 0900   meropenem (MERREM) IV Stopped (05/27/23 0543)   micafungin (MYCAMINE) 200 mg in sodium chloride 0.9  % 100 mL IVPB 110 mL/hr at 05/27/23 0900   midazolam 11 mg/hr (05/27/23 0900)   norepinephrine (LEVOPHED) Adult infusion 15 mcg/min (05/27/23 0900)   prismasol BGK 4/2.5 1,500 mL/hr at 05/27/23 2952   sodium phosphate 15 mmol in dextrose 5 % 250 mL infusion     vancomycin Stopped (05/26/23 2307)   vasopressin 0.04 Units/min (05/27/23 0900)    Scheduled Medications:  sodium chloride   Intravenous Once   atorvastatin  80 mg Per Tube q1800   bisacodyl  10 mg Oral Daily   Or   bisacodyl  10 mg Rectal Daily   Chlorhexidine Gluconate Cloth  6 each Topical Daily   docusate  100 mg Per Tube BID   ezetimibe  10 mg Per Tube Daily   feeding supplement (PROSource TF20)  60 mL Per Tube Q4H   insulin aspart  0-15 Units Subcutaneous Q4H   metoCLOPramide (REGLAN) injection  10 mg Intravenous Once   metoCLOPramide (REGLAN) injection  5 mg Intravenous Q6H   multivitamin  1 tablet Per Tube BID   mouth rinse  15 mL Mouth Rinse Q2H   pantoprazole (PROTONIX) IV  40 mg Intravenous QHS   polyethylene glycol  17 g Per Tube BID   sodium chloride flush  3 mL Intravenous Q12H    have reviewed scheduled and prn medications.  Physical Exam: General: Critically ill looking male, intubated, sedated, multiple lines Heart: S1S2 Lungs: Multiple lines and ECMO in the chest, diminished breath sounds bibasilar, intubated Abdomen:soft,  non-distended Extremities: no sig edema b/l Les Neuro: sedated Dialysis Access: LIJ HD line   Dalicia Kisner 05/27/2023,9:40 AM  LOS: 9 days

## 2023-05-27 NOTE — Progress Notes (Addendum)
Pharmacy Antibiotic Note  [name] is a 62 y.o. male admitted on 05/17/2023 with bioprosthetic aortic valve dehiscence > s/p aortic root / valve repair possible endocarditis - s/p MSSA TV endocarditis 01/2023. Initially afebrile, wbc 12 esrd >CRRT / ECMO post op. Current antibiotics: vancomycin + meropenem.  Vancomycin trough of 17 obtained 9/16 on CRRT, therapeutic. WBC remains elevated at 48.0, will add antifungal coverage and add micafungin.  Plan: Vancomycin 1gm IV q24h Meropenem 1gm IV q8h Micafungin 200 mg IV q24h  Height: 5\' 6"  (167.6 cm) Weight: 84 kg (185 lb 3 oz) IBW/kg (Calculated) : 63.8  Temp (24hrs), Avg:97.6 F (36.4 C), Min:97.3 F (36.3 C), Max:97.7 F (36.5 C)  Recent Labs  Lab 05/22/23 2042 05/23/23 0438 05/25/23 0412 05/25/23 1154 05/25/23 1703 05/26/23 0404 05/26/23 1151 05/26/23 1554 05/27/23 0307  WBC  --    < > 44.9*  --  48.9* 48.7* 49.7* 49.2* 48.0*  CREATININE  --    < > 1.19  --  1.14 1.14  --  1.33* 1.20  LATICACIDVEN 1.7   < > 2.1* 1.5 1.2 1.6  --   --  1.6  VANCOTROUGH 17  --   --   --   --   --   --   --   --    < > = values in this interval not displayed.    Estimated Creatinine Clearance: 64.1 mL/min (by C-G formula based on SCr of 1.2 mg/dL).    Allergies  Allergen Reactions   Other Anaphylaxis    Mushrooms  Not listed on MAR    Plavix [Clopidogrel Bisulfate] Other (See Comments)    TTP Not listed on the [facility]   Fleet Enema [Enema] Other (See Comments)    Unknown reaction   Lovenox [Enoxaparin] Other (See Comments)    Unknown reaction   Morphine Other (See Comments)    Unknown reaction   Nsaids Other (See Comments)    Unknown reaction   Hydrocodone-Acetaminophen Itching    Antimicrobials this admission: Ceftriaxone 9/11>> 9/15 Meropenem 9/15 >> Vanc 9/11>> Micafungin 9/20 >>  Dose adjustments this admission:  Microbiology results: 9/20 bronch sputum cx: pending 9/13 Aortic valve: ngtd 9/12 MRSA PCR: neg 9/11 BCx:  ngtd 9/11 MRSA pcr neg  Wilmer Floor, PharmD PGY2 Cardiology Pharmacy Resident 05/27/2023 3:50 PM

## 2023-05-27 NOTE — Progress Notes (Signed)
ANTICOAGULATION CONSULT NOTE - Follow up Consult  Pharmacy Consult for bivalirudin Indication:  VA ECMO  Allergies  Allergen Reactions   Other Anaphylaxis    Mushrooms  Not listed on MAR    Plavix [Clopidogrel Bisulfate] Other (See Comments)    TTP Not listed on the [facility]   Fleet Enema [Enema] Other (See Comments)    Unknown reaction   Lovenox [Enoxaparin] Other (See Comments)    Unknown reaction   Morphine Other (See Comments)    Unknown reaction   Nsaids Other (See Comments)    Unknown reaction   Hydrocodone-Acetaminophen Itching    Patient Measurements: Height: 5\' 6"  (167.6 cm) Weight: 84 kg (185 lb 3 oz) IBW/kg (Calculated) : 63.8 Heparin Dosing Weight: 81.3 kg  Vital Signs: Temp: 97.7 F (36.5 C) (09/20 1000) Temp Source: Core (09/20 0400) Pulse Rate: 106 (09/20 1000)  Labs: Recent Labs    05/25/23 0412 05/25/23 0421 05/26/23 0404 05/26/23 0411 05/26/23 1151 05/26/23 1554 05/26/23 1603 05/26/23 2316 05/26/23 2322 05/27/23 0307 05/27/23 0315 05/27/23 0432 05/27/23 0438 05/27/23 0813  HGB 9.0*   < > 9.1*   < > 7.4* 8.7*   < >  --    < > 8.4* 9.2*  --  8.8* 8.8*  HCT 27.3*   < > 27.0*   < > 22.5* 26.1*   < >  --    < > 25.6* 27.0*  --  26.0* 26.0*  PLT 28*   < > 20*  --  45* 38*  --   --   --  24*  --   --   --   --   APTT 59*   < > 68*  --   --  74*  --  59*  --   --   --  73*  --   --   LABPROT 19.5*  --  18.6*  --   --   --   --   --   --   --   --  19.3*  --   --   INR 1.6*  --  1.5*  --   --   --   --   --   --   --   --  1.6*  --   --   CREATININE 1.19   < > 1.14  --   --  1.33*  --   --   --  1.20  --   --   --   --    < > = values in this interval not displayed.    Estimated Creatinine Clearance: 64.1 mL/min (by C-G formula based on SCr of 1.2 mg/dL).   Assessment: 62 yom underwent Bentall/AVR, TV repair, re-implantation of SVG-RCA complicated with vasoplegia and pulmonary edema requiring central VA ECMO. No AC PTA.  aPTT supratherapeutic  at 73 sec on bival 0.013 mg/kg/hr. CRRT was held while patient was in OR yesterday but resumed afterwards in early afternoon. Hgb 8.8, plt remain low 24. LDH remains elevated at 1295. Fibrinogen remains elevated at 777. No circuit issues - no new fibrin in oxygenator (small fibrin streak in oxygenator). No s/sx of bleeding.  Goal of Therapy:  aPTT 50-70 seconds Monitor platelets by anticoagulation protocol: Yes   Plan:  Reduce bivalirudin to 0.0115 mg/kg/hr  Recheck aPTT at 1700  Monitor q12 hr aPTT and CBC, and for s/sx of bleeding.  Wilmer Floor, PharmD PGY2 Cardiology Pharmacy Resident 05/27/2023 10:26 AM

## 2023-05-27 NOTE — Progress Notes (Signed)
NAME:  [name], MRN:  [id number], DOB:  [birth year], LOS: 9 ADMISSION DATE:  05/24/2023, CONSULTATION DATE:  9/11 REFERRING MD:  Dr. Izora Ribas, CHIEF COMPLAINT:  aortic valve dehiscence   History of Present Illness:  Patient is [name] w/ pertinent PMH CAD s/p CABG 2017 w/ AVR, HLD, HTN, prior CVA, T2DM, CKD4 was previously on HD presents to [facility] on 9/10 chest pain.  Patient recently admitted to [facility] on 5/25 w/ AKI and AMS. While in ED patient had PEA arrest w/ ROSC in about 8 minutes. Patient required dialysis on this admission. Also w/ MSSA bacteremia associated w/ tricuspid valve endocarditis. Patient transferred to [facility] on 5/25. Treated w/ 6 week course oxacillin and rifampin. This admission also suffered a R cerebellar CVA w/ MRI likely septic emboli and had cholangitis/pancreatitis requiring ERCP.  On 9/10 patient admitted to [facility] w/ chest pain, dyspnea, and BLE edema. BNP 2,097. CXR w/ pulm edema. Patient started on IV lasix infusion. Cards consulted. On 9/11 patient transferred to [facility]. Patient's echo showing possible aortic valve dehiscence. Cultures repeated and started on rocephin. Patient transferred to ICU for TEE and general anesthesia and to start CRRT. PCCM consulted.  Pertinent  Medical History   Past Medical History:  Diagnosis Date   Allergy    Anemia    Anxiety    Baker's cyst of knee    Blood transfusion without reported diagnosis    as baby    Coronary artery disease    quadruple bypass - March 2016   GERD (gastroesophageal reflux disease)    Gouty arthritis    "real bad" (01/17/2013)   Heart murmur    Hypercholesteremia    Hypertension    MSSA bacteremia 01/29/2023   Myocardial infarction (HCC) 2017   PEA (Pulseless electrical activity) (HCC) 01/29/2023   Stroke (HCC)    Type II diabetes mellitus (HCC)     Significant Hospital Events: Including procedures, antibiotic start and stop dates in addition to other pertinent events    9/10 admitted 9/11 echo showing aortic valve dehiscence, confirmed by TEE.  9/11 started (back) on CRRT for volume overload and known CKD 4 9/12 breathing better after CRRT 9/13 to OR for Redo of aortic valve, tricuspid valve repair and ascending aortic root replacement w/ re-implantation of SVG to RCA. Post op TEE EF 55% RV mid-mod HK, AVR/TV repair stable. Could not come off CPB. Worse pulmonary edema. High dose pressors. Cannulated and started on VA ECMO w/ central cannulation  9/16 MDT ECMO rounds this AM, plans to remove more volume, 1 U PRBCs  9/18 bronch, clot and mucus plugs removed  9/19 OR for washout   Interim History / Subjective:  Washout yesterday, still too vasoplegic to wean from pump.   EF has improved markedly.  Objective   Blood pressure 92/64, pulse 87, temperature (!) 97.5 F (36.4 C), resp. rate (!) 0, height 5\' 6"  (1.676 m), weight 84 kg, SpO2 99%. PAP: (23-38)/(14-24) 25/16 CVP:  [3 mmHg-10 mmHg] 3 mmHg  Vent Mode: PRVC FiO2 (%):  [50 %] 50 % Set Rate:  [15 bmp-24 bmp] 24 bmp Vt Set:  [400 mL] 400 mL PEEP:  [5 cmH20] 5 cmH20 Plateau Pressure:  [26 cmH20-28 cmH20] 28 cmH20   Intake/Output Summary (Last 24 hours) at 05/27/2023 0706 Last data filed at 05/27/2023 0700 Gross per 24 hour  Intake 4334.41 ml  Output 3178 ml  Net 1156.41 ml   Filed Weights   05/25/23  0530 05/15/2023 0500 05/27/23 0500  Weight: 80.5 kg 78.4 kg 84 kg    Examination: Sedated on vent Pupils small/equal ETT minimal secretions Chest dressed, mediastinal drains minimal ouptut Lungs rhonci worse on R, DP 25 cmH2O (30-5) Ext no edema ECMO circuit looks good, minimal clots  ABG ok CMP ok LDH stable 1400>>1200 CBG ok Temp ok Plts down WBC stable/slightly improved Hgb stable CXR continued dense R infiltrates more peripheral, odd configuration  Assessment & Plan:   Post cardiotomy vasoplegia/cardioplegia w/ failure to wean from CPB Acute on chronic combined HF  Hx MSSA  endocarditis S/p AVR/Bentall and reimplantation of Coronaries and TV repair  Acute respiratory failure w/ hypoxia due to volume overload, with untreated OSA CKD 4. Had recently been on HD R sided HCAP postop Circuit related hemolysis L>R BLE swelling ->LE [place] neg for DVT CAD s/p CABG in 2017 w/ AVR HTN HLD Hx of CVA DMT2 Leukocytosis, multifactorial   - Slow pump wean over weekend per AHF/TCTS - Bival for [facility] - Balanced fluid balance via CRRT and transfusions per TCTS - Sweep for pH 7.35-7.4; RR keeping at 24 today - Pressors for MAP 65 - Broad spectrum abx  - Insulin gtt - Heavy sedation for now - Repeat bronch today w/ tracheal aspirate  Best Practice (right click and "Reselect all SmartList Selections" daily)   Diet/type: TF DVT prophylaxis: bival GI prophylaxis: PPI Lines: Central line and Dialysis Catheter Foley: yes Code Status:  full code Last date of multidisciplinary goals of care discussion [mdt team discussions]  33 min cc time Myrla Halsted MD [facility] 05/27/2023 7:06 AM

## 2023-05-27 NOTE — Progress Notes (Addendum)
Nutrition Follow-up  DOCUMENTATION CODES:   Severe malnutrition in context of chronic illness  INTERVENTION:   Reverse trendelenburg >10 degrees at all times while TF infusing until able to maintain HOB >30   Tube Feeding via Cortrak:  Pivot 1.5 at 20 ml/hr (trickles only today) Goal TF Regimen is Pivot 1.5 at 60 ml/hr with Pro-Source TF20 60 mL daily TF regimen at goal provides 155 g of protein, 2240 kcals and 1094 mL of free water   Pro-Source TF20 60 mL q 4 hours while on trickle TF, each packet provides 20 grams of protein and 80 kcals.   Continue Renal MVI BID  NUTRITION DIAGNOSIS:   Severe Malnutrition related to chronic illness as evidenced by severe muscle depletion, moderate fat depletion, edema, severe fat depletion, moderate muscle depletion.  Being addressed via nutrition support  GOAL:   Patient will meet greater than or equal to 90% of their needs  Progressing but not met  MONITOR:   Vent status, Labs, Weight trends, TF tolerance, Skin, I & O's  REASON FOR ASSESSMENT:   Consult Assessment of nutrition requirement/status  ASSESSMENT:   62 year old male with PMHx of CAD s/p CABG 2017 with AVR, HLD, HTN, prior CVA, T2DM, CKD 4 was previously on HD. Recent admission on 5/25 with AKI and AMS, had PEA arrest with ROSC in 8 minutes, required dialysis, also with MSSA bacteremia associated with tricuspid valve endocarditis, suffered right cerebellar CVA and had cholangitis/pancreatitis requiring ERCP. 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   9/10: Admitted 9/11 Echo showing aortic valve dehiscence, confirmed by TEE; CRRT started for volume overload and known CKD 4 9/13to OR for redo of aortic valve, tricuspid valve repair and ascending aortic root replacement with re-implantation of SVG to RCA; could not come off CPB, cannulated and started on VA ECMO with central cannulation 9/16 TEE with EF 30%, moderate LVH,  moderate RV dysfunction with mild enlargement, no endocarditis 9/18 Bronch this AM, TF held due to bilious output from mouth reported 9/19 Mediastinal washout, OG decompression, TF remain on hold 9/20 Bronch: copious secretions in R (pic in chart), mild irritation; abdomen soft, +stool, Cortrak placed, trickle initiated  Pt remains on vent support, sedated with precedex, versed, hydromorphone Remains on pressors: Levophed at 17, epinephrine at 9, vasopressin 0.04  +stool via FMS, 600 mL in 24 hours Abdomen much softer today, minimal output from OG tube  Cortrak placed today, Dr. Katrinka Blazing reviewed abd xray and ok placement per MD. Dr. Katrinka Blazing also agreeable to trial of trickle TF today  Phosphorus improved to 2.2 today, continuing to supplement, pt remains on CRRT. Anuric  Chest tubes with 250 mL in 24 hours, 10 mL thus far today  Labs: phosphorus 2.2 (L), magnesium 2.3 (wdl), potassium 4.2 (wdl), Creatinine wdl Meds: renal MVI BID  NUTRITION - FOCUSED PHYSICAL EXAM:  Flowsheet Row Most Recent Value  Orbital Region Severe depletion  Upper Arm Region Moderate depletion  Thoracic and Lumbar Region Unable to assess  [edema]  Buccal Region Severe depletion  Temple Region Severe depletion  Clavicle Bone Region Moderate depletion  Clavicle and Acromion Bone Region Severe depletion  Scapular Bone Region Unable to assess  [edema]  Dorsal Hand Moderate depletion  Patellar Region Severe depletion  Anterior Thigh Region Severe depletion  Posterior Calf Region Unable to assess  [edema]  Edema (RD Assessment) Moderate       Diet Order:   Diet Order  Diet NPO time specified  Diet effective midnight                   EDUCATION NEEDS:   No education needs have been identified at this time  Skin:  Skin Assessment: Skin Integrity Issues: Skin Integrity Issues:: Incisions, Other (Comment) Incisions: closed incision left leg Other: open chest  Last BM:  05/19/23 - small  type 6  Height:   Ht Readings from Last 1 Encounters:  05/20/23 5\' 6"  (1.676 m)    Weight:   Wt Readings from Last 1 Encounters:  05/27/23 84 kg    Ideal Body Weight:  64.5 kg  BMI:  Body mass index is 29.89 kg/m.  Estimated Nutritional Needs:   Kcal:  2200-2400  Protein:  130-150 grams  Fluid:  1L plus UOP   Romelle Starcher MS, RDN, LDN, CNSC Registered Dietitian 3 Clinical Nutrition RD Pager and On-Call Pager Number Located in [place]

## 2023-05-27 NOTE — Progress Notes (Signed)
ANTICOAGULATION CONSULT NOTE - Follow up Consult  Pharmacy Consult for bivalirudin Indication:  VA ECMO  Allergies  Allergen Reactions   Other Anaphylaxis    Mushrooms  Not listed on MAR    Plavix [Clopidogrel Bisulfate] Other (See Comments)    TTP Not listed on the [facility]   Fleet Enema [Enema] Other (See Comments)    Unknown reaction   Lovenox [Enoxaparin] Other (See Comments)    Unknown reaction   Morphine Other (See Comments)    Unknown reaction   Nsaids Other (See Comments)    Unknown reaction   Hydrocodone-Acetaminophen Itching    Patient Measurements: Height: 5\' 6"  (167.6 cm) Weight: 78.4 kg (172 lb 13.5 oz) IBW/kg (Calculated) : 63.8 Heparin Dosing Weight: 81.3 kg  Vital Signs: Temp: 97.7 F (36.5 C) (09/20 0000) Temp Source: Core (09/19 2000) BP: 92/64 (09/19 1501) Pulse Rate: 86 (09/20 0000)  Labs: Recent Labs    05/24/23 0411 05/24/23 0413 05/25/23 0412 05/25/23 0421 05/25/23 1703 05/25/23 2001 05/26/23 0404 05/26/23 0411 05/26/23 1151 05/26/23 1554 05/26/23 1603 05/26/23 1948 05/26/23 2124 05/26/23 2316 05/26/23 2322  HGB 8.7*   < > 9.0*   < > 9.3*  10.2*   < > 9.1*   < > 7.4* 8.7*   < > 8.8* 8.8*  --  8.8*  HCT 25.3*   < > 27.3*   < > 28.6*  30.0*   < > 27.0*   < > 22.5* 26.1*   < > 26.0* 26.0*  --  26.0*  PLT 38*   < > 28*  --  24*  --  20*  --  45* 38*  --   --   --   --   --   APTT 66*   < > 59*  --  73*  --  68*  --   --  74*  --   --   --  59*  --   LABPROT 20.7*  --  19.5*  --   --   --  18.6*  --   --   --   --   --   --   --   --   INR 1.8*  --  1.6*  --   --   --  1.5*  --   --   --   --   --   --   --   --   CREATININE 1.24   < > 1.19  --  1.14  --  1.14  --   --  1.33*  --   --   --   --   --    < > = values in this interval not displayed.    Estimated Creatinine Clearance: 56 mL/min (A) (by C-G formula based on SCr of 1.33 mg/dL (H)).   Assessment: 62 yom underwent Bentall/AVR, TV repair, re-implantation of SVG-RCA  complicated with vasoplegia and pulmonary edema requiring central VA ECMO. No AC PTA.  aPTT slightly supratherapeutic at 74 sec on bival 0.015 mg/kg/hr. Of note, infusion was paused briefly ~1hr during transit to OR. Also, CRRT was held ~5hr in [place], likely contributing to decreased clearance of drug. Hgb 9.5, plt increased from 20 > 38 after transfusion. LDH up slightly to 1426. Fibrinogen >800. CRRT now resumed. No circuit issues - no fibrin in oxygenator. No s/sx of bleeding.  aPTT 59 - therapeutic   Goal of Therapy:  aPTT 50-70 seconds Monitor platelets by anticoagulation protocol: Yes  Plan:  Continue bivalirudin at 0.013 mg/kg/hr  Recheck aPTT w AM labs  Monitor q12 hr aPTT and CBC, and for s/sx of bleeding.  Calton Dach, PharmD, BCCCP Clinical Pharmacist 05/27/2023 12:09 AM

## 2023-05-27 NOTE — Plan of Care (Signed)
  Problem: Respiratory: Goal: Ability to maintain a clear airway and adequate ventilation will improve Outcome: Progressing   Problem: Clinical Measurements: Goal: Diagnostic test results will improve Outcome: Progressing Goal: Respiratory complications will improve Outcome: Progressing Goal: Cardiovascular complication will be avoided Outcome: Progressing   Problem: Elimination: Goal: Will not experience complications related to bowel motility Outcome: Progressing   Problem: Skin Integrity: Goal: Risk for impaired skin integrity will decrease Outcome: Progressing

## 2023-05-27 NOTE — Procedures (Signed)
Cortrak  Person Inserting Tube:  Mahala Menghini, RD Tube Type:  Cortrak - 55 inches Tube Size:  10 Tube Location:  Left nare Secured by: Bridle Technique Used to Measure Tube Placement:  Marking at nare/corner of mouth Cortrak Secured At:  73 cm   Cortrak Tube Team Note:  Consult received to place a Cortrak feeding tube.   X-ray is required, abdominal x-ray has been ordered by the Cortrak team. Please confirm tube placement before using the Cortrak tube.   If the tube becomes dislodged please keep the tube and contact the Cortrak team at [email or web address] for replacement.  If after hours and replacement cannot be delayed, place a NG tube and confirm placement with an abdominal x-ray.    Mertie Clause, MS, RD, LDN Registered Dietitian II Please see [email or web address] for contact information.

## 2023-05-27 NOTE — Progress Notes (Signed)
ANTICOAGULATION CONSULT NOTE - Follow up Consult  Pharmacy Consult for bivalirudin Indication:  VA ECMO  Allergies  Allergen Reactions   Other Anaphylaxis    Mushrooms  Not listed on MAR    Plavix [Clopidogrel Bisulfate] Other (See Comments)    TTP Not listed on the [facility]   Fleet Enema [Enema] Other (See Comments)    Unknown reaction   Lovenox [Enoxaparin] Other (See Comments)    Unknown reaction   Morphine Other (See Comments)    Unknown reaction   Nsaids Other (See Comments)    Unknown reaction   Hydrocodone-Acetaminophen Itching    Patient Measurements: Height: 5\' 6"  (167.6 cm) Weight: 84 kg (185 lb 3 oz) IBW/kg (Calculated) : 63.8 Heparin Dosing Weight: 81.3 kg  Vital Signs: Temp: 97.7 F (36.5 C) (09/20 1730) Temp Source: Core (09/20 1600) BP: 92/70 (09/20 1120) Pulse Rate: 83 (09/20 1730)  Labs: Recent Labs    05/25/23 0412 05/25/23 0421 05/26/23 0404 05/26/23 0411 05/26/23 1554 05/26/23 1603 05/26/23 2316 05/26/23 2322 05/27/23 0307 05/27/23 0315 05/27/23 0432 05/27/23 0438 05/27/23 0813 05/27/23 1611 05/27/23 1619  HGB 9.0*   < > 9.1*   < > 8.7*   < >  --    < > 8.4*   < >  --    < > 8.8* 8.1* 8.8*  HCT 27.3*   < > 27.0*   < > 26.1*   < >  --    < > 25.6*   < >  --    < > 26.0* 24.8* 26.0*  PLT 28*   < > 20*   < > 38*  --   --   --  24*  --   --   --   --  19*  --   APTT 59*   < > 68*  --  74*  --  59*  --   --   --  73*  --   --  78*  --   LABPROT 19.5*  --  18.6*  --   --   --   --   --   --   --  19.3*  --   --   --   --   INR 1.6*  --  1.5*  --   --   --   --   --   --   --  1.6*  --   --   --   --   CREATININE 1.19   < > 1.14  --  1.33*  --   --   --  1.20  --   --   --   --  1.36*  --    < > = values in this interval not displayed.    Estimated Creatinine Clearance: 56.5 mL/min (A) (by C-G formula based on SCr of 1.36 mg/dL (H)).   Assessment: 62 yom underwent Bentall/AVR, TV repair, re-implantation of SVG-RCA complicated with  vasoplegia and pulmonary edema requiring central VA ECMO.  No AC PTA.  aPTT supratherapeutic (78 sec) and up despite rate decrease to bival 0.0115 mg/kg/hr.  Continues on CRRT, no bleeding noted.    Goal of Therapy:  aPTT 50-70 seconds Monitor platelets by anticoagulation protocol: Yes   Plan:  Reduce bivalirudin to 0.01 mg/kg/hr  4 hour aPTT  Monitor q12 hr aPTT and CBC, and for s/sx of bleeding.  Trixie Rude, PharmD Clinical Pharmacist 05/27/2023  5:51 PM

## 2023-05-27 NOTE — Progress Notes (Signed)
1 Day Post-Op Procedure(s) (LRB): MEDIASTINAL WASHOUT (N/A) INTRAOPERATIVE TRANSESOPHAGEAL ECHOCARDIOGRAM (N/A) Subjective:  Stable hemodynamics overnight.   Remains on vaso 0.03, epi 9, NE 15  ECMO flow 3 l/min. MAP 65-70, PA 23/15, CVP 5.  I/O +1179 cc/24 hrs. Trying to keep even to slightly negative.  Objective: Vital signs in last 24 hours: Temp:  [96.4 F (35.8 C)-99.3 F (37.4 C)] 97.7 F (36.5 C) (09/20 0615) Pulse Rate:  [73-110] 84 (09/20 0615) Cardiac Rhythm: Junctional rhythm (09/20 0400) Resp:  [0-24] 0 (09/20 0615) BP: (92-96)/(64) 92/64 (09/19 1501) SpO2:  [98 %-100 %] 99 % (09/20 0615) Arterial Line BP: (80-106)/(51-69) 80/57 (09/20 0615) FiO2 (%):  [50 %] 50 % (09/20 0348) Weight:  [84 kg] 84 kg (09/20 0500)  Hemodynamic parameters for last 24 hours: PAP: (23-38)/(14-24) 23/15 CVP:  [3 mmHg-10 mmHg] 5 mmHg  Intake/Output from previous day: 09/19 0701 - 09/20 0700 In: 4242.8 [I.V.:1826; Blood:952; NG/GT:350; IV Piggyback:754.8] Out: 3063 [Urine:65; Emesis/NG output:50; Stool:250; Blood:200; Chest Tube:250] Intake/Output this shift: Total I/O In: 1601.1 [I.V.:881.2; NG/GT:300; IV Piggyback:420] Out: 1667 [Urine:65; Emesis/NG output:50; Stool:100; Chest Tube:40]  General appearance: intubated and sedated Neurologic: unable to assess Heart: regular rate and rhythm, S1, S2 normal, no murmur Lungs: coarse and tubular on right. Abdomen: soft, no bowel sounds Extremities: edema mild in legs Wound: chest dressing intact. No hematoma under [place].  Chest tube output minimal Lab Results: Recent Labs    05/26/23 1554 05/26/23 1603 05/27/23 0307 05/27/23 0315 05/27/23 0438  WBC 49.2*  --  48.0*  --   --   HGB 8.7*   < > 8.4* 9.2* 8.8*  HCT 26.1*   < > 25.6* 27.0* 26.0*  PLT 38*  --  24*  --   --    < > = values in this interval not displayed.   BMET:  Recent Labs    05/26/23 1554 05/26/23 1603 05/27/23 0307 05/27/23 0315 05/27/23 0438  NA 135    < > 136 136 136  K 4.5   < > 4.3 4.3 4.4  CL 102  --  99  --   --   CO2 22  --  23  --   --   GLUCOSE 127*  --  107*  --   --   BUN 20  --  17  --   --   CREATININE 1.33*  --  1.20  --   --   CALCIUM 7.8*  --  8.2*  --   --    < > = values in this interval not displayed.    PT/INR:  Recent Labs    05/27/23 0432  LABPROT 19.3*  INR 1.6*   ABG    Component Value Date/Time   PHART 7.355 05/27/2023 0438   HCO3 25.4 05/27/2023 0438   TCO2 27 05/27/2023 0438   ACIDBASEDEF 2.0 05/27/2023 0315   O2SAT 99 05/27/2023 0438   CBG (last 3)  Recent Labs    05/26/23 1946 05/26/23 2320 05/27/23 0312  GLUCAP 117* 142* 110*   CXR: I think aeration of right lung looks a little improved today. Left lung stable.  Assessment/Plan:  POD 7 redo sternotomy, removal of dehisced aortic valve prosthesis, aortic root homograft, extension of vein graft to RCA, TV annuloplasty.  Hemodynamics stable on current vasopressors with VA ECMO 3l/min. LV and RV function looked good yesterday when weaned to 1.5 l/min but MAP dropped into 50's due to vasoplegia. Will continue ECMO support into next  week and reevaluate.  Continue bivalirudin anticoagulation.  Thrombocytopenia: received two units plts in OR yesterday and increased from 20K to 45K but trending back down to 24K this am. Would not transfuse further unless there is bleeding.  Continue broad spectrum antibiotics for pneumonia. Preop BC and operative cultures negative.  Ideally would like to start nutrition but no BS yet. Suctioned out over 2L of dark bilious fluid a couple days ago when OG inserted after vomiting.   LOS: 9 days    Alleen Borne 05/27/2023

## 2023-05-27 NOTE — Progress Notes (Addendum)
Daily Progress Note   Patient Name: [name]       Date: 05/27/2023 DOB: 06/04/1961  Age: 62 y.o. MRN#: [id number] Attending Physician: Alleen Borne, MD Primary Care Physician: Marcine Matar, MD Admit Date: 05/29/2023  Reason for Consultation/Follow-up: Patient is on ECMO  Length of Stay: 9  Current Medications: Scheduled Meds:   sodium chloride   Intravenous Once   atorvastatin  80 mg Per Tube q1800   bisacodyl  10 mg Oral Daily   Or   bisacodyl  10 mg Rectal Daily   Chlorhexidine Gluconate Cloth  6 each Topical Daily   docusate  100 mg Per Tube BID   ezetimibe  10 mg Per Tube Daily   feeding supplement (PROSource TF20)  60 mL Per Tube Q4H   insulin aspart  0-15 Units Subcutaneous Q4H   metoCLOPramide (REGLAN) injection  5 mg Intravenous Q6H   multivitamin  1 tablet Per Tube BID   mouth rinse  15 mL Mouth Rinse Q2H   pantoprazole (PROTONIX) IV  40 mg Intravenous QHS   polyethylene glycol  17 g Per Tube BID   sodium chloride flush  3 mL Intravenous Q12H    Continuous Infusions:   prismasol BGK 4/2.5 400 mL/hr at 05/27/23 1227    prismasol BGK 4/2.5 400 mL/hr at 05/27/23 1226   sodium chloride Stopped (05/24/23 0032)   sodium chloride Stopped (05/22/23 1310)   sodium chloride 10 mL/hr at 06/04/2023 0700   albumin human     anticoagulant sodium citrate     bivalirudin (ANGIOMAX) 250 mg in sodium chloride 0.9 % 500 mL (0.5 mg/mL) infusion 0.0115 mg/kg/hr (05/27/23 1400)   dexmedetomidine (PRECEDEX) IV infusion 0.7 mcg/kg/hr (05/27/23 1400)   epinephrine 8 mcg/min (05/27/23 1400)   feeding supplement (PIVOT 1.5 CAL) 20 mL/hr at 05/27/23 1400   HYDROmorphone 4 mg/hr (05/27/23 1400)   insulin Stopped (05/21/23 0754)   lactated ringers 10 mL/hr at 05/27/23 1400    meropenem (MERREM) IV Stopped (05/27/23 1337)   micafungin (MYCAMINE) 200 mg in sodium chloride 0.9 % 100 mL IVPB Stopped (05/27/23 0944)   midazolam 11 mg/hr (05/27/23 1400)   norepinephrine (LEVOPHED) Adult infusion 15 mcg/min (05/27/23 1400)   prismasol BGK 4/2.5 1,500 mL/hr at 05/27/23 1349   sodium phosphate 15 mmol in dextrose 5 % 250 mL infusion  43 mL/hr at 05/27/23 1400   vancomycin Stopped (05/25/2023 2307)   vasopressin 0.04 Units/min (05/27/23 1400)    PRN Meds: sodium chloride, sodium chloride, albumin human, anticoagulant sodium citrate, artificial tears, dextrose, HYDROmorphone, midazolam, midazolam, morphine injection, ondansetron (ZOFRAN) IV, mouth rinse, oxyCODONE, sodium chloride flush, traMADol  Physical Exam Constitutional:      Appearance: He is ill-appearing.     Interventions: He is sedated and intubated.     Comments: ECMO  Pulmonary:     Effort: He is intubated.             Vital Signs: BP 92/70   Pulse 84   Temp (!) 97.5 F (36.4 C)   Resp (!) 0   Ht 5\' 6"  (1.676 m)   Wt 84 kg   SpO2 99%   BMI 29.89 kg/m  SpO2: SpO2: 99 % O2 Device: O2 Device: Ventilator O2 Flow Rate: O2 Flow Rate (L/min): 4 L/min   Patient Active Problem List   Diagnosis Date Noted   Postoperative shock, cardiogenic (HCC) 2023/05/25   Acute on chronic combined systolic and diastolic CHF (congestive heart failure) (HCC) 05/18/2023   Prosthetic aortic valve failure 05/18/2023   Acute respiratory failure with hypoxia (HCC) 05/18/2023   Anemia 05/18/2023   Volume overload 05/30/2023   Acute on chronic congestive heart failure (HCC) 05/09/2023   ESRD (end stage renal disease) on dialysis (HCC) 04/11/2023   Endocarditis of tricuspid valve 03/30/2023   Medication management 03/30/2023   Symptomatic anemia 02/26/2023   Allergy, unspecified, initial encounter 02/25/2023   Anaphylactic shock, unspecified, initial encounter 02/25/2023   Dependence on renal dialysis (HCC) 02/15/2023    Chronic kidney disease, stage 3 unspecified (HCC) 02/15/2023   Hypertensive chronic kidney disease with stage 1 through stage 4 chronic kidney disease, or unspecified chronic kidney disease 02/15/2023   Personal history of transient ischemic attack (TIA), and cerebral infarction without residual deficits 02/15/2023   Sepsis (HCC) 01/29/2023   TTP (thrombotic thrombocytopenic purpura) (HCC) 01/29/2023   MSSA bacteremia 01/29/2023   PEA (Pulseless electrical activity) (HCC) 01/29/2023   Severe sepsis with septic shock (HCC) 01/29/2023   Prosthetic valve endocarditis (HCC) 01/29/2023   Influenza vaccine refused 10/09/2020   OSA on CPAP 02/06/2019   Macroalbuminuric diabetic nephropathy (HCC) 02/06/2019   TIA (transient ischemic attack) 04/10/2018   Colon cancer screening 05/04/2017   Cerebral infarction (HCC) 12/11/2015   Acute ischemic VBA thalamic stroke (HCC)    Atherosclerosis of native coronary artery of native heart without angina pectoris    H/O heart bypass surgery    Benign essential HTN    Type 2 diabetes mellitus without complication, without long-term current use of insulin (HCC)    S/P AVR 11/25/2015   CAD (coronary artery disease)    Chronic periodontitis 11/21/2015   Bicuspid aortic valve    Controlled type 2 diabetes mellitus with diabetic nephropathy, without long-term current use of insulin (HCC)    Essential hypertension 02/24/2015   Dyslipidemia 02/24/2015   GERD (gastroesophageal reflux disease) 05/02/2013   Primary gout 05/02/2013   Allergic rhinitis due to allergen 07/09/2010   Class 1 obesity 07/08/2008    Palliative Care Assessment & Plan   Patient Profile: 62 y.o. male  with past medical history of CAD s/p CABG 2017 w/ AVR (magna ease for bicuspid valve disease), HLD, HTN, prior CVA, T2DM, CKD4 was previously on HD admitted to [facility] on 05/19/2023 for evaluation of chest pain.    He was found to have an aortic  valve dehiscence confirmed by TEE on 05/18/23.  Started CRRT for volume overload that day. 05/21/2023 he went to OR for redo of aortic valve, tricuspid valve repair and ascending aortic root replacement with re-implantation of SVG to RCA; could not come off CPB, cannulated and started on VA ECMO with central cannulation   Recent admission on 5/25 with AKI and AMS, had PEA arrest with ROSC in 8 minutes, required dialysis, also with MSSA bacteremia associated with tricuspid valve endocarditis, suffered right cerebellar CVA and had cholangitis/pancreatitis requiring ERCP.   Significant Hospital events/per CCM   27-May-2023 admitted 9/11 echo showing aortic valve dehiscence, confirmed by TEE.  9/11 started (back) on CRRT for volume overload and known CKD 4 9/12 breathing better after CRRT 9/13 to OR for Redo of aortic valve, tricuspid valve repair and ascending aortic root replacement w/ re-implantation of SVG to RCA. Post op TEE EF 55% RV mid-mod HK, AVR/TV repair stable. Could not come off CPB. Worse pulmonary edema. High dose pressors. Cannulated and started on VA ECMO w/ central cannulation  9/16 MDT ECMO rounds this AM, plans to remove more volume, 1 U PRBCs  9/18 bronch, clot and mucus plugs removed  9/19 OR for washout  Subjective: Patient remains critically ill with multiorgan system failure. No family at bedside.  1:50: Spoke with daughter Danford Bad. Her brother and her are doing well and taking things one day at a time. I encouraged her to reach out with needs.  Recommendations/Plan: Full code Full scope Time for outcomes Encouraged continued discussion re: goals of care PMT support- as needed   Code Status:    Code Status Orders  (From admission, onward)           Start     Ordered   05/21/23 0806  Full code  Continuous       Comments: Full code with ACLS Protocol WITH NO CHEST COMPRESSIONS  Question:  By:  Answer:  Consent: discussion documented in EHR   05/21/23 0806        Extensive chart review has been completed   including labs, vital signs, imaging, progress/consult notes, orders, medications and available advance directive documents.  Discussed with nursing staff at bedside   Care plan was discussed with bedside RN  Time spent: 25 minutes  Thank you for allowing the Palliative Medicine Team to assist in the care of this patient.   Sherryll Burger, NP  Please contact Palliative Medicine Team phone at [phone] for questions and concerns.

## 2023-05-27 NOTE — Progress Notes (Signed)
Patient ID: [name], male   DOB: 1960/12/12, 62 y.o.   MRN: [id number]  TCTS Evening Rounds:  Hemodynamics stable on current vasopressors. We gave him a dose of methylene blue today which helped for a short time but did not have a lasting effect.  ECMO flow 3 l/min.  CRRT to keep even.  Bronch today by CCM with copious brown secretions in right lung. Abundant WBC but no organisms on GS. Cultures pending.

## 2023-05-27 NOTE — Progress Notes (Addendum)
Patient ID: [name], male   DOB: 1961-09-06, 62 y.o.   MRN: [id number]     Advanced Heart Failure Rounding Note  PCP-Cardiologist: Kristeen Miss, MD   Subjective:    9/13: OR for Bentall/AVR, TV repair, re-implantation of SVG-RCA.  Post-op vasoplegia and pulmonary edema, required central VA ECMO (RA drainage, ascending aorta return.  Post-op TEE EF 55%, mild-moderate RV hypokinesis.  9/14: Blender added to circuit due to high PaO2 9/16: TEE with EF 30%, moderate LVH, moderate RV dysfunction with mild enlargement, stable bioprosthetic aortic valve, repaired TV with mild TR, no endocarditis noted.  9/18: Bronchoscopy with mucus plugs and clots. 9/19: To OR for mediastinal washout.  By TEE, EF 50% with moderate LVH, mild RV enlargement and mild RV dysfunction, stable bioprosthetic aortic valve, repaired TV with mild TR.  He did not tolerate lowering of ECMO flows even with increasing of pressors due to vasoplegia.   MAP 65-70 on epinephrine 9, NE 15,  and vasopressin 0.03. CVVH running even to slightly positive.  CXR with some clearing of left lung, right lung still with prominent airspace disease without changes.  H/o MSSA endocarditis, currently on meropenem/vancomycin. WBCs 54 => 53 => 45 => 48.7 => 48K.   Hgb 8.4, plts 43 => 38 => 28 => 20 => 24  [place]: CVP 3 PA 25/16  Rhythm = junctional  VA ECMO 2950 rpm Flow 3.2 L/min pVen -16 Delta P 21 Sweep 4 LDH 775 => 1020 => 1431=> 1353 => 1209 => 1426 => 1295 Lactate 1.3 => 1.5 => 1.4 => 1.1 => 2.1 => 1.6 => 1.6 ABG 7.35/45/174   Objective:   Weight Range: 84 kg Body mass index is 29.89 kg/m.   Vital Signs:   Temp:  [96.4 F (35.8 C)-99.3 F (37.4 C)] 97.5 F (36.4 C) (09/20 0700) Pulse Rate:  [73-110] 87 (09/20 0700) Resp:  [0-24] 0 (09/20 0700) BP: (92-96)/(64) 92/64 (09/19 1501) SpO2:  [98 %-100 %] 99 % (09/20 0700) Arterial Line BP: (80-106)/(51-69) 89/61 (09/20 0700) FiO2 (%):  [50 %] 50 % (09/20 0348) Weight:   [84 kg] 84 kg (09/20 0500) Last BM Date : 05/19/23  Weight change: Filed Weights   05/25/23 0530 05/28/2023 0500 05/27/23 0500  Weight: 80.5 kg 78.4 kg 84 kg    Intake/Output:   Intake/Output Summary (Last 24 hours) at 05/27/2023 0725 Last data filed at 05/27/2023 0700 Gross per 24 hour  Intake 4334.41 ml  Output 3178 ml  Net 1156.41 ml      Physical Exam    General: Sedated on vent Neck: No JVD, no thyromegaly or thyroid nodule.  Lungs: Clear to auscultation bilaterally with normal respiratory effort. CV: Chest open.  No peripheral edema.   Abdomen: Soft, nontender, no hepatosplenomegaly, no distention.  Skin: Intact without lesions or rashes.  Neurologic: Sedated on vent. . Extremities: No clubbing or cyanosis.  HEENT: Normal.   Telemetry   Junctional rhythm 70s (personally reviewed)   Labs    CBC Recent Labs    05/21/2023 1554 05/18/2023 1603 05/27/23 0307 05/27/23 0315 05/27/23 0438  WBC 49.2*  --  48.0*  --   --   HGB 8.7*   < > 8.4* 9.2* 8.8*  HCT 26.1*   < > 25.6* 27.0* 26.0*  MCV 90.3  --  89.8  --   --   PLT 38*  --  24*  --   --    < > = values in this interval not displayed.  Basic Metabolic Panel Recent Labs    69/62/95 0404 06/04/2023 0411 06/04/2023 1554 06/01/2023 1603 05/27/23 0307 05/27/23 0315 05/27/23 0438  NA 135   < > 135   < > 136 136 136  K 4.0   < > 4.5   < > 4.3 4.3 4.4  CL 104  --  102  --  99  --   --   CO2 22  --  22  --  23  --   --   GLUCOSE 118*  --  127*  --  107*  --   --   BUN 21  --  20  --  17  --   --   CREATININE 1.14  --  1.33*  --  1.20  --   --   CALCIUM 8.2*  --  7.8*  --  8.2*  --   --   MG 2.5*  --   --   --  2.3  --   --   PHOS 1.7*  --  3.6  --  2.2*  --   --    < > = values in this interval not displayed.   Liver Function Tests Recent Labs    05/21/2023 0404 06/04/2023 1554 05/27/23 0307  AST 64*  --  68*  ALT 12  --  11  ALKPHOS 105  --  85  BILITOT 1.5*  --  1.5*  PROT 6.3*  --  5.8*  ALBUMIN 2.3*   2.3* 2.2* 2.2*  2.2*   No results for input(s): "LIPASE", "AMYLASE" in the last 72 hours. Cardiac Enzymes No results for input(s): "CKTOTAL", "CKMB", "CKMBINDEX", "TROPONINI" in the last 72 hours.  BNP: BNP (last 3 results) Recent Labs    05/12/2023 1100  BNP 2,097.0*    ProBNP (last 3 results) No results for input(s): "PROBNP" in the last 8760 hours.   D-Dimer No results for input(s): "DDIMER" in the last 72 hours. Hemoglobin A1C No results for input(s): "HGBA1C" in the last 72 hours.  Fasting Lipid Panel No results for input(s): "CHOL", "HDL", "LDLCALC", "TRIG", "CHOLHDL", "LDLDIRECT" in the last 72 hours. Thyroid Function Tests No results for input(s): "TSH", "T4TOTAL", "T3FREE", "THYROIDAB" in the last 72 hours.  Invalid input(s): "FREET3"  Other results:   Imaging    DG CHEST PORT 1 VIEW  Result Date: 05/10/2023 CLINICAL DATA:  Orogastric tube placement.  ECMO. EXAM: PORTABLE CHEST 1 VIEW COMPARISON:  05/22/2023 and 05/25/2023 FINDINGS: The endotracheal and feeding tubes have been removed. Orogastric tube with its tip in the distal esophagus. This could be advanced 15 cm place it in the mid stomach. The other tubes and catheters are unchanged. Stable enlarged cardiac silhouette. Dense air space consolidation in the right lung with mild progression. Mildly improved aeration of the left lung. Lower thoracic spine degenerative changes. IMPRESSION: 1. Orogastric tube with its tip in the distal esophagus. This could be advanced 15 cm place it in the mid stomach. 2. Mild progression of dense air space consolidation in the right lung. 3. Mildly improved aeration of the left lung. These results will be called to the ordering clinician or representative by the Radiologist Assistant, and communication documented in the PACS or [employer]. Electronically Signed   By: Beckie Salts M.D.   On: 05/19/2023 15:30   DG Abd 1 View  Result Date: 05/16/2023 CLINICAL DATA:  Orogastric  tube placement EXAM: ABDOMEN - 1 VIEW COMPARISON:  KUB 1 day prior FINDINGS: The orogastric tube  tip is at the level of the GE junction and could be advanced 2-3 cm. ECMO cannula, chest tubes, and presumed mediastinal drains are again noted which appear overall stable. There is a paucity of bowel gas in the imaged abdomen. There is no definite free intraperitoneal air. IMPRESSION: Enteric catheter tip at the level of the GE junction. This could be advanced 2-3 cm for better placement. Electronically Signed   By: Lesia Hausen M.D.   On: 05/30/2023 14:13     Medications:     Scheduled Medications:  sodium chloride   Intravenous Once   atorvastatin  80 mg Per Tube q1800   bisacodyl  10 mg Oral Daily   Or   bisacodyl  10 mg Rectal Daily   Chlorhexidine Gluconate Cloth  6 each Topical Daily   docusate  100 mg Per Tube BID   ezetimibe  10 mg Per Tube Daily   feeding supplement (PROSource TF20)  60 mL Per Tube Q4H   insulin aspart  0-15 Units Subcutaneous Q4H   metoCLOPramide (REGLAN) injection  5 mg Intravenous Q6H   multivitamin  1 tablet Per Tube BID   mouth rinse  15 mL Mouth Rinse Q2H   pantoprazole (PROTONIX) IV  40 mg Intravenous QHS   polyethylene glycol  17 g Per Tube BID   sodium chloride flush  3 mL Intravenous Q12H    Infusions:   prismasol BGK 4/2.5 400 mL/hr at 05/22/2023 2346    prismasol BGK 4/2.5 400 mL/hr at 05/25/2023 2346   sodium chloride Stopped (05/24/23 0032)   sodium chloride Stopped (05/22/23 1310)   sodium chloride 10 mL/hr at 06/04/2023 0700   anticoagulant sodium citrate     bivalirudin (ANGIOMAX) 250 mg in sodium chloride 0.9 % 500 mL (0.5 mg/mL) infusion 0.013 mg/kg/hr (05/27/23 0700)   dexmedetomidine (PRECEDEX) IV infusion 0.7 mcg/kg/hr (05/27/23 0700)   epinephrine 9 mcg/min (05/27/23 0700)   HYDROmorphone 4 mg/hr (05/27/23 0700)   insulin Stopped (05/21/23 0754)   lactated ringers 10 mL/hr at 05/27/23 0700   meropenem (MERREM) IV Stopped (05/27/23 0543)    midazolam 11 mg/hr (05/27/23 0700)   norepinephrine (LEVOPHED) Adult infusion 15 mcg/min (05/27/23 0700)   prismasol BGK 4/2.5 1,500 mL/hr at 05/27/23 0623   vancomycin Stopped (06/02/2023 2307)   vasopressin 0.03 Units/min (05/27/23 0700)    PRN Medications: sodium chloride, sodium chloride, anticoagulant sodium citrate, artificial tears, dextrose, HYDROmorphone, midazolam, midazolam, morphine injection, ondansetron (ZOFRAN) IV, mouth rinse, oxyCODONE, sodium chloride flush, traMADol    Assessment/Plan   1. Post-cardiotomy vasoplegia/shock with failure to wean from CPB: Now on central VA ECMO (06/05/2023) with RA drainage/ascending aorta return. Post-op echo with EF 55%, mild-moderate RV dysfunction. Unable to get images yesterday by TTE.  TEE on 9/16 showed EF 30%, moderate LVH, moderate RV dysfunction with mild enlargement, stable bioprosthetic aortic valve, repaired TV with mild TR, no endocarditis noted.  To OR for mediastinal washout on 9/19.  By TEE 9/19, EF 50% with moderate LVH, mild RV enlargement and mild RV dysfunction, stable bioprosthetic aortic valve, repaired TV with mild TR.  He did not tolerate lowering of ECMO flows even with increasing of pressors due to vasoplegia. Today, MAP around 65-70 on epinephrine 9 + NE 15 + vasopressin 0.03, pulsatile arterial line.  CXR with clearing left lung, right lung with persistent airspace disease not clearing with CVVH.  CVVH now running even.  CVP 3.   - We decreased ECMO flow to 3.0 L/min. Will try to  slowly lower ECMO flow over the weekend, aim for 0.5 L/min/day.  Hopefully will have resolution of vasoplegia and can wean from [place] ECMO to VV ECMO.  - Discussed Impella 5.5 yesterday in OR with Dr. Laneta Simmers, but does not have access for this and with improved LV function not sure it would be helpful.  - Run CVVH even to slightly positive today, aim for CVP around 7 for better hemodynamics.   - Wean pressors as able.   - Continue bivalirudin today with  goal PTT 50-70.  2. Acute hypoxic respiratory failure, post-op: On vent, rest settings. Blender added to keep PaO2 down. Sweep remains 5.5. CXR with some clearing of left lung, right lung with ongoing diffuse airspace disease without change.  Suspect extensive PNA, think we have done what we can with CVVH for lung clearing at this point with CVP 6 and weight down.  - Keep CVVH even to slightly positive today.  - Meropenem/vancomycin for coverage of PNA 3. Severe prosthetic AI: Due to previous MSSA endocarditis with partial valve dehiscence s/p re-do AVR/Bentall with reimplantation of SVG-RCA and TV repair 05/25/2023.   - Continuing meropenem/vancomycin.  4. CAD s/p CABG/AVR 2017:  s/p AVR/Bentall and re-implantation of SVG-RCA 05/25/2023. - Off ASA with platelets down.  5. ESRD: CVVH as above.  6. DM2: management per CCM 7. Heart block: Post-op, now in stable junctional rhythm.   8. H/o CVA: Prior cerebellar CVA.  9. Thrombocytopenia: Post-op, 43 => 38 => 28 => 20 => 24 K today.  Suspect inflammatory and hemolysis.  10. Anemia: Post-op, transfuse hgb < 8. 8.4 today. 11. ID: H/o MSSA bacteremia.  WBCs 54 => 53 => 45 => 48.7 => 48K now.  - Meropenem + vancomycin.  - Add micafungin for fungal coverage today.  12. FEN: TFs off currently, ?aspiration earlier this week.   - Replace Cortrack.   CRITICAL CARE Performed by: [name]  Total critical care time: 45 minutes  Critical care time was exclusive of separately billable procedures and treating other patients.  Critical care was necessary to treat or prevent imminent or life-threatening deterioration.  Critical care was time spent personally by me on the following activities: development of treatment plan with patient and/or surrogate as well as nursing, discussions with consultants, evaluation of patient's response to treatment, examination of patient, obtaining history from patient or surrogate, ordering and performing treatments and  interventions, ordering and review of laboratory studies, ordering and review of radiographic studies, pulse oximetry and re-evaluation of patient's condition.    Length of Stay: 9  [name], MD  05/27/2023, 7:25 AM  Advanced Heart Failure Team Pager [phone] (M-F; 7a - 5p)  Please contact [facility] for night-coverage after hours (5p -7a ) and weekends on [email or web address]

## 2023-05-28 ENCOUNTER — Inpatient Hospital Stay (HOSPITAL_COMMUNITY): Payer: PPO

## 2023-05-28 DIAGNOSIS — J9601 Acute respiratory failure with hypoxia: Secondary | ICD-10-CM | POA: Diagnosis not present

## 2023-05-28 DIAGNOSIS — T8111XA Postprocedural  cardiogenic shock, initial encounter: Secondary | ICD-10-CM | POA: Diagnosis not present

## 2023-05-28 DIAGNOSIS — E43 Unspecified severe protein-calorie malnutrition: Secondary | ICD-10-CM | POA: Insufficient documentation

## 2023-05-28 DIAGNOSIS — I509 Heart failure, unspecified: Secondary | ICD-10-CM | POA: Diagnosis not present

## 2023-05-28 LAB — RENAL FUNCTION PANEL
Albumin: 2.1 g/dL — ABNORMAL LOW (ref 3.5–5.0)
Albumin: 2 g/dL — ABNORMAL LOW (ref 3.5–5.0)
Anion gap: 10 (ref 5–15)
Anion gap: 8 (ref 5–15)
BUN: 16 mg/dL (ref 8–23)
BUN: 20 mg/dL (ref 8–23)
CO2: 23 mmol/L (ref 22–32)
CO2: 24 mmol/L (ref 22–32)
Calcium: 8.4 mg/dL — ABNORMAL LOW (ref 8.9–10.3)
Calcium: 8.4 mg/dL — ABNORMAL LOW (ref 8.9–10.3)
Chloride: 103 mmol/L (ref 98–111)
Chloride: 103 mmol/L (ref 98–111)
Creatinine, Ser: 1.13 mg/dL (ref 0.61–1.24)
Creatinine, Ser: 1.14 mg/dL (ref 0.61–1.24)
GFR, Estimated: >60 mL/min (ref >60)
GFR, Estimated: >60 mL/min (ref >60)
Glucose, Bld: 142 mg/dL — ABNORMAL HIGH (ref 70–99)
Glucose, Bld: 152 mg/dL — ABNORMAL HIGH (ref 70–99)
Phosphorus: 1.8 mg/dL — ABNORMAL LOW (ref 2.5–4.6)
Phosphorus: 1.4 mg/dL — ABNORMAL LOW (ref 2.5–4.6)
Potassium: 4.1 mmol/L (ref 3.5–5.1)
Potassium: 3.6 mmol/L (ref 3.5–5.1)
Sodium: 136 mmol/L (ref 135–145)
Sodium: 135 mmol/L (ref 135–145)

## 2023-05-28 LAB — POCT I-STAT 7, (LYTES, BLD GAS, ICA,H+H)
Acid-base deficit: 1 mmol/L (ref 0.0–2.0)
Acid-base deficit: 1 mmol/L (ref 0.0–2.0)
Acid-base deficit: 2 mmol/L (ref 0.0–2.0)
Acid-base deficit: 1 mmol/L (ref 0.0–2.0)
Acid-base deficit: 1 mmol/L (ref 0.0–2.0)
Bicarbonate: 24.8 mmol/L (ref 20.0–28.0)
Bicarbonate: 23.8 mmol/L (ref 20.0–28.0)
Bicarbonate: 25.1 mmol/L (ref 20.0–28.0)
Bicarbonate: 24.4 mmol/L (ref 20.0–28.0)
Bicarbonate: 24.5 mmol/L (ref 20.0–28.0)
Calcium, Ion: 1.18 mmol/L (ref 1.15–1.40)
Calcium, Ion: 1.18 mmol/L (ref 1.15–1.40)
Calcium, Ion: 1.21 mmol/L (ref 1.15–1.40)
Calcium, Ion: 1.2 mmol/L (ref 1.15–1.40)
Calcium, Ion: 1.17 mmol/L (ref 1.15–1.40)
HCT: 28 % — ABNORMAL LOW (ref 39.0–52.0)
HCT: 27 % — ABNORMAL LOW (ref 39.0–52.0)
HCT: 30 % — ABNORMAL LOW (ref 39.0–52.0)
HCT: 28 % — ABNORMAL LOW (ref 39.0–52.0)
HCT: 28 % — ABNORMAL LOW (ref 39.0–52.0)
Hemoglobin: 9.5 g/dL — ABNORMAL LOW (ref 13.0–17.0)
Hemoglobin: 10.2 g/dL — ABNORMAL LOW (ref 13.0–17.0)
Hemoglobin: 9.2 g/dL — ABNORMAL LOW (ref 13.0–17.0)
Hemoglobin: 9.5 g/dL — ABNORMAL LOW (ref 13.0–17.0)
Hemoglobin: 9.5 g/dL — ABNORMAL LOW (ref 13.0–17.0)
O2 Saturation: 98 %
O2 Saturation: 97 %
O2 Saturation: 98 %
O2 Saturation: 98 %
O2 Saturation: 97 %
Patient temperature: 36.6
Patient temperature: 36.5
Patient temperature: 36.5
Patient temperature: 36.5
Patient temperature: 36.6
Potassium: 4.1 mmol/L (ref 3.5–5.1)
Potassium: 3.9 mmol/L (ref 3.5–5.1)
Potassium: 3.9 mmol/L (ref 3.5–5.1)
Potassium: 3.9 mmol/L (ref 3.5–5.1)
Potassium: 3.7 mmol/L (ref 3.5–5.1)
Sodium: 136 mmol/L (ref 135–145)
Sodium: 136 mmol/L (ref 135–145)
Sodium: 137 mmol/L (ref 135–145)
Sodium: 137 mmol/L (ref 135–145)
Sodium: 135 mmol/L (ref 135–145)
TCO2: 26 mmol/L (ref 22–32)
TCO2: 25 mmol/L (ref 22–32)
TCO2: 27 mmol/L (ref 22–32)
TCO2: 26 mmol/L (ref 22–32)
TCO2: 26 mmol/L (ref 22–32)
pCO2 arterial: 43.7 mmHg (ref 32–48)
pCO2 arterial: 45.3 mmHg (ref 32–48)
pCO2 arterial: 46.4 mmHg (ref 32–48)
pCO2 arterial: 44.5 mmHg (ref 32–48)
pCO2 arterial: 43.9 mmHg (ref 32–48)
pH, Arterial: 7.36 (ref 7.35–7.45)
pH, Arterial: 7.327 — ABNORMAL LOW (ref 7.35–7.45)
pH, Arterial: 7.339 — ABNORMAL LOW (ref 7.35–7.45)
pH, Arterial: 7.344 — ABNORMAL LOW (ref 7.35–7.45)
pH, Arterial: 7.352 (ref 7.35–7.45)
pO2, Arterial: 102 mmHg (ref 83–108)
pO2, Arterial: 116 mmHg — ABNORMAL HIGH (ref 83–108)
pO2, Arterial: 97 mmHg (ref 83–108)
pO2, Arterial: 103 mmHg (ref 83–108)
pO2, Arterial: 95 mmHg (ref 83–108)

## 2023-05-28 LAB — CBC
HCT: 26.4 % — ABNORMAL LOW (ref 39.0–52.0)
HCT: 26.6 % — ABNORMAL LOW (ref 39.0–52.0)
Hemoglobin: 8.9 g/dL — ABNORMAL LOW (ref 13.0–17.0)
Hemoglobin: 8.7 g/dL — ABNORMAL LOW (ref 13.0–17.0)
MCH: 30.3 pg (ref 26.0–34.0)
MCH: 29.9 pg (ref 26.0–34.0)
MCHC: 33.7 g/dL (ref 30.0–36.0)
MCHC: 32.7 g/dL (ref 30.0–36.0)
MCV: 89.8 fL (ref 80.0–100.0)
MCV: 91.4 fL (ref 80.0–100.0)
Platelets: 19 10*3/uL — CL (ref 150–400)
Platelets: 20 10*3/uL — CL (ref 150–400)
RBC: 2.94 MIL/uL — ABNORMAL LOW (ref 4.22–5.81)
RBC: 2.91 MIL/uL — ABNORMAL LOW (ref 4.22–5.81)
RDW: 19.4 % — ABNORMAL HIGH (ref 11.5–15.5)
RDW: 20 % — ABNORMAL HIGH (ref 11.5–15.5)
WBC: 42 10*3/uL — ABNORMAL HIGH (ref 4.0–10.5)
WBC: 48.5 10*3/uL — ABNORMAL HIGH (ref 4.0–10.5)
nRBC: 0.8 % — ABNORMAL HIGH (ref 0.0–0.2)
nRBC: 0.8 % — ABNORMAL HIGH (ref 0.0–0.2)

## 2023-05-28 LAB — LACTIC ACID, PLASMA: Lactic Acid, Venous: 1.8 mmol/L (ref 0.5–1.9)

## 2023-05-28 LAB — ALT: ALT: 9 U/L (ref 0–44)

## 2023-05-28 LAB — PROTIME-INR
INR: 1.7 — ABNORMAL HIGH (ref 0.8–1.2)
Prothrombin Time: 19.7 seconds — ABNORMAL HIGH (ref 11.4–15.2)

## 2023-05-28 LAB — APTT
aPTT: 70 seconds — ABNORMAL HIGH (ref 24–36)
aPTT: 70 seconds — ABNORMAL HIGH (ref 24–36)

## 2023-05-28 LAB — BILIRUBIN, TOTAL: Total Bilirubin: 2.1 mg/dL — ABNORMAL HIGH (ref 0.3–1.2)

## 2023-05-28 LAB — FIBRINOGEN: Fibrinogen: >800 mg/dL — ABNORMAL HIGH (ref 210–475)

## 2023-05-28 LAB — COOXEMETRY PANEL
Carboxyhemoglobin: 3.3 % — ABNORMAL HIGH (ref 0.5–1.5)
Methemoglobin: <0.7 % (ref 0.0–1.5)
O2 Saturation: 78.1 %
Total hemoglobin: 8.8 g/dL — ABNORMAL LOW (ref 12.0–16.0)

## 2023-05-28 LAB — GLUCOSE, CAPILLARY
Glucose-Capillary: 136 mg/dL — ABNORMAL HIGH (ref 70–99)
Glucose-Capillary: 138 mg/dL — ABNORMAL HIGH (ref 70–99)
Glucose-Capillary: 141 mg/dL — ABNORMAL HIGH (ref 70–99)
Glucose-Capillary: 157 mg/dL — ABNORMAL HIGH (ref 70–99)
Glucose-Capillary: 127 mg/dL — ABNORMAL HIGH (ref 70–99)

## 2023-05-28 LAB — ALKALINE PHOSPHATASE: Alkaline Phosphatase: 85 U/L (ref 38–126)

## 2023-05-28 LAB — MISC LABCORP TEST (SEND OUT): Labcorp test code: 4051

## 2023-05-28 LAB — LACTATE DEHYDROGENASE: LDH: 1077 U/L — ABNORMAL HIGH (ref 98–192)

## 2023-05-28 LAB — PROTEIN, TOTAL: Total Protein: 5.9 g/dL — ABNORMAL LOW (ref 6.5–8.1)

## 2023-05-28 LAB — BILIRUBIN, DIRECT: Bilirubin, Direct: 1.1 mg/dL — ABNORMAL HIGH (ref 0.0–0.2)

## 2023-05-28 LAB — AST: AST: 60 U/L — ABNORMAL HIGH (ref 15–41)

## 2023-05-28 MED ORDER — MIDODRINE HCL 5 MG PO TABS
5.0000 mg | ORAL_TABLET | Freq: Three times a day (TID) | ORAL | Status: DC
  Administered 2023-05-28 (×3): 5 mg via ORAL
  Filled 2023-05-28 (×3): qty 1

## 2023-05-28 MED ORDER — METHYLNALTREXONE BROMIDE 12 MG/0.6ML ~~LOC~~ SOLN
12.0000 mg | Freq: Once | SUBCUTANEOUS | Status: AC
  Administered 2023-05-28: 12 mg via SUBCUTANEOUS
  Filled 2023-05-28: qty 0.6

## 2023-05-28 MED ORDER — ETOMIDATE 2 MG/ML IV SOLN
INTRAVENOUS | Status: AC
  Filled 2023-05-28: qty 20

## 2023-05-28 MED ORDER — POTASSIUM PHOSPHATES 15 MMOLE/5ML IV SOLN
30.0000 mmol | Freq: Once | INTRAVENOUS | Status: AC
  Administered 2023-05-28: 30 mmol via INTRAVENOUS
  Filled 2023-05-28: qty 10

## 2023-05-28 MED ORDER — MIDODRINE HCL 5 MG PO TABS
5.0000 mg | ORAL_TABLET | Freq: Three times a day (TID) | ORAL | Status: DC
  Administered 2023-05-29: 5 mg
  Filled 2023-05-28: qty 1

## 2023-05-28 MED ORDER — ROCURONIUM BROMIDE 10 MG/ML (PF) SYRINGE
PREFILLED_SYRINGE | INTRAVENOUS | Status: AC
  Filled 2023-05-28: qty 10

## 2023-05-28 NOTE — Progress Notes (Signed)
ANTICOAGULATION CONSULT NOTE - Follow up Consult  Pharmacy Consult for bivalirudin Indication:  VA ECMO  Allergies  Allergen Reactions   Other Anaphylaxis    Mushrooms  Not listed on MAR    Plavix [Clopidogrel Bisulfate] Other (See Comments)    TTP Not listed on the [facility]   Fleet Enema [Enema] Other (See Comments)    Unknown reaction   Lovenox [Enoxaparin] Other (See Comments)    Unknown reaction   Morphine Other (See Comments)    Unknown reaction   Nsaids Other (See Comments)    Unknown reaction   Hydrocodone-Acetaminophen Itching    Patient Measurements: Height: 5\' 6"  (167.6 cm) Weight: 84.8 kg (186 lb 15.2 oz) IBW/kg (Calculated) : 63.8 Heparin Dosing Weight: 81.3 kg  Vital Signs: Temp: 97.9 F (36.6 C) (09/21 1615) Temp Source: Core (09/21 0800) BP: 96/65 (09/21 1506) Pulse Rate: 92 (09/21 1615)  Labs: Recent Labs    05/26/23 0404 05/26/23 0411 05/27/23 0432 05/27/23 0438 05/27/23 1611 05/27/23 1619 05/27/23 2204 05/27/23 2206 05/28/23 0411 05/28/23 0416 05/28/23 1235 05/28/23 1558 05/28/23 1600  HGB 9.1*   < >  --    < > 8.1*   < > 7.7*   < > 8.9*   < > 9.5* 8.7* 9.5*  HCT 27.0*   < >  --    < > 24.8*   < > 23.1*   < > 26.4*   < > 28.0* 26.6* 28.0*  PLT 20*   < >  --   --  19*  --   --   --  19*  --   --  20*  --   APTT 68*   < > 73*  --  78*  --  77*  --  70*  --   --  70*  --   LABPROT 18.6*  --  19.3*  --   --   --   --   --  19.7*  --   --   --   --   INR 1.5*  --  1.6*  --   --   --   --   --  1.7*  --   --   --   --   CREATININE 1.14   < >  --   --  1.36*  --   --   --  1.13  --   --  1.14  --    < > = values in this interval not displayed.    Estimated Creatinine Clearance: 67.7 mL/min (by C-G formula based on SCr of 1.14 mg/dL).   Assessment: 62 yom underwent Bentall/AVR, TV repair, re-implantation of SVG-RCA complicated with vasoplegia and pulmonary edema requiring central VA ECMO.  No AC PTA.  aPTT remains therapeutic at upper end of  range at 70 seconds. Pltc remains low at 20k, H/H stable. Fibrinogen remains >800, LDH >1000.   Goal of Therapy:  aPTT 50-70 seconds Monitor platelets by anticoagulation protocol: Yes   Plan:  Reduce bivalirudin to 0.006 mg/kg/hr  Monitor q12 hr aPTT and CBC, and for s/sx of bleeding.  Eldridge Scot, PharmD, BCCCP Clinical Pharmacist 05/28/2023, 4:39 PM

## 2023-05-28 NOTE — Progress Notes (Signed)
Patient ID: [name], male   DOB: 02/14/1961, 62 y.o.   MRN: [id number]     Advanced Heart Failure Rounding Note  PCP-Cardiologist: Kristeen Miss, MD   Subjective:    June 16, 2023: OR for Bentall/AVR, TV repair, re-implantation of SVG-RCA.  Post-op vasoplegia and pulmonary edema, required central VA ECMO (RA drainage, ascending aorta return.  Post-op TEE EF 55%, mild-moderate RV hypokinesis.  9/14: Blender added to circuit due to high PaO2 9/16: TEE with EF 30%, moderate LVH, moderate RV dysfunction with mild enlargement, stable bioprosthetic aortic valve, repaired TV with mild TR, no endocarditis noted.  9/18: Bronchoscopy with mucus plugs and clots. 9/19: To OR for mediastinal washout.  By TEE, EF 50% with moderate LVH, mild RV enlargement and mild RV dysfunction, stable bioprosthetic aortic valve, repaired TV with mild TR.  He did not tolerate lowering of ECMO flows even with increasing of pressors due to vasoplegia.   Remains intubated/sedated. On Central [facility] ECMO. MAPs 65-70 on epinephrine 9-> 10, NE 15-> 20,  and vasopressin 0.03. CVVH running even to slightly positive.  CXR worse today.   H/o MSSA endocarditis, currently on meropenem/vancomycin. WBCs 54 => 53 => 45 => 48.7 => 48K. +> 42K  Hgb 8.9, plts 43 => 38 => 28 => 20 => 24 => 19k No bleeding  Swan: CVP 4 PA 30/19 CO/CI 3.9/2.0 SVR 1224  Rhythm = junctional  VA ECMO 2910 rpm Flow 3.0 L/min pVen -11 Delta P 23 Sweep 4 LDH 1077 Lactate 1.8 ABG 7.36/44/102   Objective:   Weight Range: 84.8 kg Body mass index is 30.17 kg/m.   Vital Signs:   Temp:  [97.5 F (36.4 C)-97.9 F (36.6 C)] 97.7 F (36.5 C) (09/21 0800) Pulse Rate:  [66-106] 79 (09/21 0800) Resp:  [0-25] 0 (09/21 0800) BP: (92-94)/(59-70) 94/59 (09/21 0701) SpO2:  [97 %-100 %] 100 % (09/21 0800) Arterial Line BP: (70-112)/(47-73) 88/56 (09/21 0800) FiO2 (%):  [50 %] 50 % (09/21 0701) Weight:  [84.8 kg] 84.8 kg (09/21 0600) Last BM Date :  05/27/23  Weight change: Filed Weights   05/08/2023 0500 05/27/23 0500 05/28/23 0600  Weight: 78.4 kg 84 kg 84.8 kg    Intake/Output:   Intake/Output Summary (Last 24 hours) at 05/28/2023 0845 Last data filed at 05/28/2023 0800 Gross per 24 hour  Intake 4605.03 ml  Output 3949 ml  Net 656.03 ml      Physical Exam    General: Sedated on vent HEENT: + ETT OG Neck: supple. + swan Cor: Chest open + cannulas and CTs Lungs: coarse Abdomen: soft, mildly distended. Hypoactive bowel sounds. Extremities: warm no edema Neuro: intubated sedates  Telemetry   Junctional rhythm 70s (personally reviewed)   Labs    CBC Recent Labs    05/27/23 1611 05/27/23 1619 05/28/23 0411 05/28/23 0416  WBC 47.1*  --  42.0*  --   HGB 8.1*   < > 8.9* 9.5*  HCT 24.8*   < > 26.4* 28.0*  MCV 92.2  --  89.8  --   PLT 19*  --  19*  --    < > = values in this interval not displayed.   Basic Metabolic Panel Recent Labs    78/29/56 0404 05/28/2023 0411 05/27/23 0307 05/27/23 0315 05/27/23 1611 05/27/23 1619 05/28/23 0411 05/28/23 0416  NA 135   < > 136   < > 137   < > 136 136  K 4.0   < > 4.3   < >  4.0   < > 4.1 4.1  CL 104   < > 99  --  103  --  103  --   CO2 22   < > 23  --  23  --  23  --   GLUCOSE 118*   < > 107*  --  114*  --  142*  --   BUN 21   < > 17  --  14  --  16  --   CREATININE 1.14   < > 1.20  --  1.36*  --  1.13  --   CALCIUM 8.2*   < > 8.2*  --  8.1*  --  8.4*  --   MG 2.5*  --  2.3  --   --   --   --   --   PHOS 1.7*   < > 2.2*  --  2.5  --  1.8*  --    < > = values in this interval not displayed.   Liver Function Tests Recent Labs    05/27/23 0307 05/27/23 1611 05/28/23 0411  AST 68*  --  60*  ALT 11  --  9  ALKPHOS 85  --  85  BILITOT 1.5*  --  2.1*  PROT 5.8*  --  5.9*  ALBUMIN 2.2*  2.2* 2.0* 2.1*   No results for input(s): "LIPASE", "AMYLASE" in the last 72 hours. Cardiac Enzymes No results for input(s): "CKTOTAL", "CKMB", "CKMBINDEX", "TROPONINI" in  the last 72 hours.  BNP: BNP (last 3 results) Recent Labs    05/18/2023 1100  BNP 2,097.0*    ProBNP (last 3 results) No results for input(s): "PROBNP" in the last 8760 hours.   D-Dimer No results for input(s): "DDIMER" in the last 72 hours. Hemoglobin A1C No results for input(s): "HGBA1C" in the last 72 hours.  Fasting Lipid Panel No results for input(s): "CHOL", "HDL", "LDLCALC", "TRIG", "CHOLHDL", "LDLDIRECT" in the last 72 hours. Thyroid Function Tests No results for input(s): "TSH", "T4TOTAL", "T3FREE", "THYROIDAB" in the last 72 hours.  Invalid input(s): "FREET3"  Other results:   Imaging    DG Abd 1 View  Result Date: 05/27/2023 CLINICAL DATA:  Encounter for nasogastric tube placement. EXAM: ABDOMEN - 1 VIEW COMPARISON:  One-view abdomen 06-04-23 FINDINGS: Is bilateral chest tubes are in place. Mediastinal drain is in place. ECMO catheters are stable. A small bore feeding tube terminates in the distal gastric antrum or proximal duodenum. Right-sided airspace disease is again noted. IMPRESSION: 1. Small bore feeding tube terminates in the distal gastric antrum or proximal duodenum. 2. Support apparatus otherwise. Electronically Signed   By: Marin Roberts M.D.   On: 05/27/2023 11:27     Medications:     Scheduled Medications:  sodium chloride   Intravenous Once   sodium chloride   Intravenous Once   atorvastatin  80 mg Per Tube q1800   bisacodyl  10 mg Oral Daily   Or   bisacodyl  10 mg Rectal Daily   Chlorhexidine Gluconate Cloth  6 each Topical Daily   docusate  100 mg Per Tube BID   etomidate       ezetimibe  10 mg Per Tube Daily   feeding supplement (PROSource TF20)  60 mL Per Tube Q4H   insulin aspart  0-15 Units Subcutaneous Q4H   metoCLOPramide (REGLAN) injection  5 mg Intravenous Q6H   multivitamin  1 tablet Per Tube BID   mouth rinse  15 mL Mouth Rinse  Q2H   pantoprazole (PROTONIX) IV  40 mg Intravenous QHS   polyethylene glycol  17 g Per  Tube BID   rocuronium bromide       sodium chloride flush  3 mL Intravenous Q12H    Infusions:   prismasol BGK 4/2.5 400 mL/hr at 05/27/23 1227    prismasol BGK 4/2.5 400 mL/hr at 05/27/23 1226   sodium chloride Stopped (05/24/23 0032)   sodium chloride Stopped (05/22/23 1310)   sodium chloride 10 mL/hr at 05/28/23 0800   albumin human Stopped (05/27/23 1852)   anticoagulant sodium citrate     bivalirudin (ANGIOMAX) 250 mg in sodium chloride 0.9 % 500 mL (0.5 mg/mL) infusion 0.008 mg/kg/hr (05/28/23 0800)   dexmedetomidine (PRECEDEX) IV infusion 0.07 mcg/kg/hr (05/28/23 0819)   epinephrine 8 mcg/min (05/28/23 0800)   feeding supplement (PIVOT 1.5 CAL) 20 mL/hr at 05/28/23 0800   HYDROmorphone 4 mg/hr (05/28/23 0800)   insulin Stopped (05/21/23 0754)   lactated ringers Stopped (05/27/23 2100)   meropenem (MERREM) IV Stopped (05/28/23 3244)   micafungin (MYCAMINE) 200 mg in sodium chloride 0.9 % 100 mL IVPB 200 mg (05/28/23 0842)   midazolam 11 mg/hr (05/28/23 0800)   norepinephrine (LEVOPHED) Adult infusion 10 mcg/min (05/28/23 0800)   prismasol BGK 4/2.5 1,500 mL/hr at 05/28/23 0629   vancomycin Stopped (05/27/23 2251)   vasopressin 0.04 Units/min (05/28/23 0800)    PRN Medications: sodium chloride, sodium chloride, albumin human, anticoagulant sodium citrate, artificial tears, dextrose, etomidate, HYDROmorphone, midazolam, midazolam, morphine injection, ondansetron (ZOFRAN) IV, mouth rinse, oxyCODONE, rocuronium bromide, sodium chloride flush, traMADol    Assessment/Plan   1. Post-cardiotomy vasoplegia/shock with failure to wean from CPB: Now on central VA ECMO (05/29/2023) with RA drainage/ascending aorta return. Post-op echo with EF 55%, mild-moderate RV dysfunction. Unable to get images yesterday by TTE.  TEE on 9/16 showed EF 30%, moderate LVH, moderate RV dysfunction with mild enlargement, stable bioprosthetic aortic valve, repaired TV with mild TR, no endocarditis noted.  To  OR for mediastinal washout on 19-Jun-2023.  By TEE 06/19/2023, EF 50% with moderate LVH, mild RV enlargement and mild RV dysfunction, stable bioprosthetic aortic valve, repaired TV with mild TR.  He did not tolerate lowering of ECMO flows even with increasing of pressors due to vasoplegia. Today, MAP around 65-70 on epinephrine 9 + NE 15 + vasopressin 0.03, pulsatile arterial line.  CXR with clearing left lung, right lung with persistent airspace disease not clearing with CVVH.  CVVH now running even.  CVP 3.  Unable to tolerate wean from [facility] ECMO during washout. Impella 5.5 considered in OR, but does not have access for this and with improved LV function not sure it would be helpful.  - ECMO flow now down to 3.0 L/min. Plan was to to slowly lower ECMO flow over the weekend, aim for 0.5 L/min/day.  However on 3L today, MAPs are down some and index marginal with normal SVR (1250). We increased flows temporarily with improvement in outputs and MAPs but then MAPs fell back down to mid 60s. Flow turned back down to 3L - Will add midodrine 5 tid and see if this will let us decrease flows - I d/w Dr. Laneta Simmers and Dr. Katrinka Blazing at bedside - Run CVVH even to slightly positive today, aim for CVP around 7 for better hemodynamics.   - Wean pressors as able.   - Continue bivalirudin today with goal PTT 50-70. Discussed dosing with PharmD personally. - Prognosis concerning 2. Acute hypoxic respiratory failure,  post-op: On vent, rest settings. Blender added to keep PaO2 down. Sweep remains 5.5. CXR with some clearing of left lung, right lung with ongoing diffuse airspace disease without change.  Suspect extensive PNA, think we have done what we can with CVVH for lung clearing at this point. Keep CVP < 8. CXR worse today despite bronch yesterday. CCM planning to relookk - Keep CVVH even to slightly positive today.  - Meropenem/vancomycin for coverage of PNA 3. Severe prosthetic AI: Due to previous MSSA endocarditis with partial valve  dehiscence s/p re-do AVR/Bentall with reimplantation of SVG-RCA and TV repair 05/24/2023.   - Continuing meropenem/vancomycin.  4. CAD s/p CABG/AVR 2017:  s/p AVR/Bentall and re-implantation of SVG-RCA 05/09/2023. - Off ASA with platelets down.  5. ESRD: CVVH as above.  6. DM2: management per CCM 7. Heart block: Post-op, now in stable junctional rhythm.   8. H/o CVA: Prior cerebellar CVA.  9. Thrombocytopenia: Post-op, 43 => 38 => 28 => 20 => 24 K +> 19K today.  Suspect inflammatory and hemolysis.  10. Anemia: Post-op, transfuse hgb < 8. 8.9 today. 11. ID: H/o MSSA bacteremia.  WBCs 54 => 53 => 45 => 48.7 => 48K => 42Know.  - Meropenem + vancomycin.  - Micafungin for fungal coverage added 9/20 12. FEN: TFs off currently, ?aspiration earlier this week.   - TFs restarted   CRITICAL CARE Performed by: Arvilla Meres  Total critical care time: 60 minutes  Critical care time was exclusive of separately billable procedures and treating other patients.  Critical care was necessary to treat or prevent imminent or life-threatening deterioration.  Critical care was time spent personally by me on the following activities: development of treatment plan with patient and/or surrogate as well as nursing, discussions with consultants, evaluation of patient's response to treatment, examination of patient, obtaining history from patient or surrogate, ordering and performing treatments and interventions, ordering and review of laboratory studies, ordering and review of radiographic studies, pulse oximetry and re-evaluation of patient's condition.    Length of Stay: 10  Arvilla Meres, MD  05/28/2023, 8:45 AM  Advanced Heart Failure Team Pager [phone] (M-F; 7a - 5p)  Please contact [facility] for night-coverage after hours (5p -7a ) and weekends on [email or web address]

## 2023-05-28 NOTE — Progress Notes (Signed)
2 Days Post-Op Procedure(s) (LRB): MEDIASTINAL WASHOUT (N/A) INTRAOPERATIVE TRANSESOPHAGEAL ECHOCARDIOGRAM (N/A) Subjective:  Hemodynamics essentially unchanged on VA ECMO with vasoplegia requiring NE 10, epi 8 vaso 0.04.  some increase in NE overnight to 14. Flow at 3l/min.  Midodrine added.  CXR this am looks worse with dense consolidation of right lung.   CRRT keeping about even. + 808 cc yesterday.  Objective: Vital signs in last 24 hours: Temp:  [97.5 F (36.4 C)-97.9 F (36.6 C)] 97.7 F (36.5 C) (09/21 0900) Pulse Rate:  [59-106] 74 (09/21 0900) Cardiac Rhythm: Junctional rhythm (09/21 0800) Resp:  [0-25] 0 (09/21 0900) BP: (92-94)/(59-70) 94/59 (09/21 0701) SpO2:  [97 %-100 %] 100 % (09/21 0900) Arterial Line BP: (70-112)/(47-73) 100/63 (09/21 0900) FiO2 (%):  [50 %] 50 % (09/21 0701) Weight:  [84.8 kg] 84.8 kg (09/21 0600)  Hemodynamic parameters for last 24 hours: PAP: (23-39)/(15-26) 31/19 CVP:  [1 mmHg-10 mmHg] 5 mmHg PCWP:  [19 mmHg] 19 mmHg CO:  [3.9 L/min-4.6 L/min] 3.9 L/min CI:  [2 L/min/m2-2.38 L/min/m2] 2 L/min/m2  Intake/Output from previous day: 09/20 0701 - 09/21 0700 In: 4652.6 [I.V.:2179.2; Blood:315; NG/GT:913; IV Piggyback:1185.4] Out: 3844 [Urine:5; Stool:735; Chest Tube:90] Intake/Output this shift: Total I/O In: 106.2 [I.V.:86.2; NG/GT:20] Out: 200   General appearance: intubated and sedated on vent Neurologic: unable to assess Heart: regular rate and rhythm, S1, S2 normal, no murmur Lungs: coarse bilat but very tubular on right consistent with consolidation of right lung. Abdomen: distended but soft, no bowel sounds  Extremities: edema mild Wound: chest dressing intact.  Lab Results: Recent Labs    05/27/23 1611 05/27/23 1619 05/28/23 0411 05/28/23 0416  WBC 47.1*  --  42.0*  --   HGB 8.1*   < > 8.9* 9.5*  HCT 24.8*   < > 26.4* 28.0*  PLT 19*  --  19*  --    < > = values in this interval not displayed.   BMET:  Recent Labs     05/27/23 1611 05/27/23 1619 05/28/23 0411 05/28/23 0416  NA 137   < > 136 136  K 4.0   < > 4.1 4.1  CL 103  --  103  --   CO2 23  --  23  --   GLUCOSE 114*  --  142*  --   BUN 14  --  16  --   CREATININE 1.36*  --  1.13  --   CALCIUM 8.1*  --  8.4*  --    < > = values in this interval not displayed.    PT/INR:  Recent Labs    05/28/23 0411  LABPROT 19.7*  INR 1.7*   ABG    Component Value Date/Time   PHART 7.360 05/28/2023 0416   HCO3 24.8 05/28/2023 0416   TCO2 26 05/28/2023 0416   ACIDBASEDEF 1.0 05/28/2023 0416   O2SAT 78.1 05/28/2023 0456   CBG (last 3)  Recent Labs    05/27/23 2013 05/27/23 2341 05/28/23 0415  GLUCAP 97 127* 136*    Assessment/Plan:  POD 8 redo sternotomy, removal of dehisced aortic valve prosthesis, aortic root homograft, extension of vein graft to RCA, TV annuloplasty.  VA ECMO.  Remains vasoplegic requiring multiple vasopressors. Midodrine added.  Acute hypoxemic resp failure requiring VA ECMO. Right lung looked a little improved yesterday but worse today. CCM planning bronch again. Will continue support on ECMO for now. Prognosis for lung recovery declines as we get further out from surgery without improvement.  Continuing broad spectrum antibiotics for pneumonia.   ESRD on CRRT to keep even to slightly positive.  Bival for anticoagulation.  Thrombocytopenia stable at 19 K. Will observe as long as there is no bleeding.  Severe malnutrition in context of chronic illness. Trickle tube feeds going but no BS and abdomen is distended. Some stool out yesterday. I don't think we can advance tube feeds at this time.   LOS: 10 days    [name] 05/28/2023

## 2023-05-28 NOTE — Progress Notes (Signed)
Patient ID: [name], male   DOB: Dec 23, 1960, 62 y.o.   MRN: [id number]   Extracorporeal support note   ECLS support day: Indication: Post-op vasoplegia and pulmonary edema  Configuration: Central VA ECMO  Drainage cannula: RA Return cannula: Ascending aorta  Pump speed: 2910 rpm  Pump flow: 3.0 L/min Pump used: Cardiohelp  Sweep gas: 4 with blender  at 85%to lower PaO2  Circuit check: Small clot at 12p Anticoagulant: Bivalirudin Anticoagulation targets: PTT 50-70  Changes in support: none  Anticipated goals/duration of support: Wean to decannulation  .[name]  05/28/2023, 8:44 AM

## 2023-05-28 NOTE — Progress Notes (Signed)
[name]  Assessment/ Plan: [name] recently recovered GFR from dialysis dependent AKI with dehiscence  of prosthetic aortic value and severe AI/TR.   # Anuric AKI/dialysis dependent AKI with fluid overload: Started on RRT during previous admission in May 2024 until 05/10/23 (recovered). Patient is status post cardiac surgery on 9/13 and currently on ECMO. CRRT start 9/11. He remains anuric with fluid overload.  Continue to run CRRT at current prescription (resume post-op).  UF as tolerated by BP and ECMO, Angiomax for anticoagulation.  UFG: net even. Discussed with ICU RN at bedside.  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  Per CTS  # AHRF: VDRF, per primary service. Abx/antifungal per primary service  # Postcardiotomy vasoplegia/shock with failure to wean: Currently on ECMO per HF team.  On pressors per primary service.  Volume managing with CRRT. S/p washout 9/19  # Hyperkalemia: Managed with CRRT prescription and follow labs. K WNL  # Anemia: Transfuse as needed per primary team.  Discussed with ICU RN.  Subjective:  Patient seen and examined on CRRT. Running net even given CVPs Pressors: epi, NE, vaso Net pos 0.8L  Objective Vital signs in last 24 hours: Vitals:   05/28/23 0845 05/28/23 0900 05/28/23 0915 05/28/23 0930  BP:      Pulse: 68 74 78 77  Resp: (!) 0 (!) 0 14 (!) 9  Temp: 97.7 F (36.5 C) 97.7 F (36.5 C) 97.7 F (36.5 C) 97.7 F (36.5 C)  TempSrc:      SpO2: 100% 100% 100% 100%  Weight:      Height:       Weight change: 0.8 kg  Intake/Output Summary (Last 24 hours) at 05/28/2023 0956 Last data filed at 05/28/2023 0900 Gross per 24 hour  Intake 4640.28 ml  Output 4027 ml  Net 613.28 ml       Labs: RENAL PANEL Recent Labs  Lab 05/24/23 0411 05/24/23 0413 05/24/23 1600 05/24/23 1602 05/25/23 0412 05/25/23 0421  05/26/23 0404 05/26/23 0411 05/26/23 1554 05/26/23 1603 05/27/23 0307 05/27/23 0315 05/27/23 1611 05/27/23 1619 05/27/23 2206 05/28/23 0411 05/28/23 0416 05/28/23 0932 05/28/23 0942  NA 134*   < > 136   < > 138   < > 135   < > 135   < > 136   < > 137   < > 136 136 136 136 137  K 3.8   < > 3.5   < > 3.4*   < > 4.0   < > 4.5   < > 4.3   < > 4.0   < > 4.1 4.1 4.1 3.9 3.9  CL 103  --  105  --  105   < > 104  --  102  --  99  --  103  --   --  103  --   --   --   CO2 22  --  25  --  23   < > 22  --  22  --  23  --  23  --   --  23  --   --   --   GLUCOSE 115*  --  154*  --  225*   < > 118*  --  127*  --  107*  --  114*  --   --  142*  --   --   --  BUN 11  --  14  --  24*   < > 21  --  20  --  17  --  14  --   --  16  --   --   --   CREATININE 1.24  --  1.29*  --  1.19   < > 1.14  --  1.33*  --  1.20  --  1.36*  --   --  1.13  --   --   --   CALCIUM 8.1*  --  7.8*  --  8.1*   < > 8.2*  --  7.8*  --  8.2*  --  8.1*  --   --  8.4*  --   --   --   MG 2.4  --  2.5*  --  2.4  --  2.5*  --   --   --  2.3  --   --   --   --   --   --   --   --   PHOS 1.7*  --  1.7*  --  <1.0*   < > 1.7*  --  3.6  --  2.2*  --  2.5  --   --  1.8*  --   --   --   ALBUMIN 2.4*  2.4*  --  2.3*  --  2.2*  2.2*   < > 2.3*  2.3*  --  2.2*  --  2.2*  2.2*  --  2.0*  --   --  2.1*  --   --   --    < > = values in this interval not displayed.    Liver Function Tests: Recent Labs  Lab 05/26/23 0404 05/26/23 1554 05/27/23 0307 05/27/23 1611 05/28/23 0411  AST 64*  --  68*  --  60*  ALT 12  --  11  --  9  ALKPHOS 105  --  85  --  85  BILITOT 1.5*  --  1.5*  --  2.1*  PROT 6.3*  --  5.8*  --  5.9*  ALBUMIN 2.3*  2.3*   < > 2.2*  2.2* 2.0* 2.1*   < > = values in this interval not displayed.   No results for input(s): "LIPASE", "AMYLASE" in the last 168 hours. No results for input(s): "AMMONIA" in the last 168 hours. CBC: Recent Labs    08/12/22 0934 01/28/23 2355 05/17/23 2116 05/18/23 0411  05/19/23 0636 05/20/23 0309 05/27/23 1611 05/27/23 1619 05/27/23 2206 05/28/23 0411 05/28/23 0416 05/28/23 0932 05/28/23 0942  HGB 10.2*   < > 7.6*   < > 8.1*   < > 8.1*   < > 8.2* 8.9* 9.5* 10.2* 9.2*  MCV 94   < >  --    < > 89.3   < > 92.2  --   --  89.8  --   --   --   VITAMINB12  --   --   --   --  701  --   --   --   --   --   --   --   --   FERRITIN 537*  --  2,200*  --   --   --   --   --   --   --   --   --   --   TIBC 264  --  204*  --   --   --   --   --   --   --   --   --   --  IRON 83  --  140  --   --   --   --   --   --   --   --   --   --    < > = values in this interval not displayed.    Cardiac Enzymes: No results for input(s): "CKTOTAL", "CKMB", "CKMBINDEX", "TROPONINI" in the last 168 hours. CBG: Recent Labs  Lab 05/27/23 1206 05/27/23 1616 05/27/23 2013 05/27/23 2341 05/28/23 0415  GLUCAP 112* 119* 97 127* 136*    Iron Studies: No results for input(s): "IRON", "TIBC", "TRANSFERRIN", "FERRITIN" in the last 72 hours. Studies/Results: DG CHEST PORT 1 VIEW  Result Date: 05/28/2023 CLINICAL DATA:  [id number] Personal history of ECMO [id number] EXAM: PORTABLE CHEST 1 VIEW COMPARISON:  May 27, 2023, May 26, 2023. FINDINGS: The cardiomediastinal silhouette is unchanged and obscure.ETT tip terminates 2.6 cm above the carina. ECMO cannulation support apparatus. RIGHT chest CVC tip terminates over the superior cavoatrial junction. LEFT IJ PA catheter tip terminates over the expected main PA. Bilateral chest tubes and midline surgical drains. Overlying tubing and hemostats. The enteric tube courses through the chest to the abdomen beyond the field-of-view. No significant pneumothorax. Persistent heterogeneous opacification of the near entirety of the RIGHT lung, favored not significantly changed in September 19th. Mild aeration of the RIGHT apex. Similar appearance of hazy opacities throughout the LEFT lung with a background interstitial prominence. IMPRESSION: 1.  Support apparatus as described above. 2. Similar appearance of bilateral RIGHT greater than LEFT airspace opacities Electronically Signed   By: Meda Klinefelter M.D.   On: 05/28/2023 09:20   DG Abd 1 View  Result Date: 05/27/2023 CLINICAL DATA:  Encounter for nasogastric tube placement. EXAM: ABDOMEN - 1 VIEW COMPARISON:  One-view abdomen 05/26/2023 FINDINGS: Is bilateral chest tubes are in place. Mediastinal drain is in place. ECMO catheters are stable. A small bore feeding tube terminates in the distal gastric antrum or proximal duodenum. Right-sided airspace disease is again noted. IMPRESSION: 1. Small bore feeding tube terminates in the distal gastric antrum or proximal duodenum. 2. Support apparatus otherwise. Electronically Signed   By: Marin Roberts M.D.   On: 05/27/2023 11:27   DG CHEST PORT 1 VIEW  Result Date: 05/27/2023 CLINICAL DATA:  ECMO EXAM: PORTABLE CHEST 1 VIEW COMPARISON:  Chest radiograph 1 day prior FINDINGS: The endotracheal tube tip is proximally 2.3 cm from the carina. The left IJ Swan-Ganz catheter terminates in the pulmonary outflow tract. The right IJ vascular catheter terminates in the region of the cavoatrial junction. The enteric catheter tip is likely in the region of the GE junction. Bilateral chest tubes and presumed mediastinal drains as well as ECMO cannula appear overall stable. The cardiomediastinal silhouette is stable Extensive airspace opacities in the right lung and to a lesser degree in the left upper lobe are overall unchanged. There is no pneumothorax There is no acute osseous abnormality. IMPRESSION: 1. Enteric catheter tip likely in the region of the GE junction. Recommend advancement. 2. Additional support apparatus as above is overall stable. 3. Extensive airspace opacities in the right lung and to a lesser degree in the left upper lobe are overall unchanged. Electronically Signed   By: Lesia Hausen M.D.   On: 05/27/2023 08:40   DG CHEST PORT 1  VIEW  Result Date: 05/26/2023 CLINICAL DATA:  Orogastric tube placement.  ECMO. EXAM: PORTABLE CHEST 1 VIEW COMPARISON:  05/26/2023 and 05/25/2023 FINDINGS: The endotracheal and feeding tubes have been removed. Orogastric tube with  its tip in the distal esophagus. This could be advanced 15 cm place it in the mid stomach. The other tubes and catheters are unchanged. Stable enlarged cardiac silhouette. Dense air space consolidation in the right lung with mild progression. Mildly improved aeration of the left lung. Lower thoracic spine degenerative changes. IMPRESSION: 1. Orogastric tube with its tip in the distal esophagus. This could be advanced 15 cm place it in the mid stomach. 2. Mild progression of dense air space consolidation in the right lung. 3. Mildly improved aeration of the left lung. These results will be called to the ordering clinician or representative by the Radiologist Assistant, and communication documented in the PACS or [employer]. Electronically Signed   By: Beckie Salts M.D.   On: 05/26/2023 15:30   DG Abd 1 View  Result Date: 05/26/2023 CLINICAL DATA:  Orogastric tube placement EXAM: ABDOMEN - 1 VIEW COMPARISON:  KUB 1 day prior FINDINGS: The orogastric tube tip is at the level of the GE junction and could be advanced 2-3 cm. ECMO cannula, chest tubes, and presumed mediastinal drains are again noted which appear overall stable. There is a paucity of bowel gas in the imaged abdomen. There is no definite free intraperitoneal air. IMPRESSION: Enteric catheter tip at the level of the GE junction. This could be advanced 2-3 cm for better placement. Electronically Signed   By: Lesia Hausen M.D.   On: 05/26/2023 14:13    Medications: Infusions:   prismasol BGK 4/2.5 400 mL/hr at 05/27/23 1227    prismasol BGK 4/2.5 400 mL/hr at 05/27/23 1226   sodium chloride Stopped (05/24/23 0032)   sodium chloride Stopped (05/22/23 1310)   sodium chloride 10 mL/hr at 05/28/23 0900   albumin  human Stopped (05/27/23 1852)   anticoagulant sodium citrate     bivalirudin (ANGIOMAX) 250 mg in sodium chloride 0.9 % 500 mL (0.5 mg/mL) infusion 0.007 mg/kg/hr (05/28/23 0900)   dexmedetomidine (PRECEDEX) IV infusion 0.7 mcg/kg/hr (05/28/23 0900)   epinephrine 9 mcg/min (05/28/23 0900)   feeding supplement (PIVOT 1.5 CAL) Stopped (05/28/23 0900)   HYDROmorphone 4 mg/hr (05/28/23 0927)   insulin Stopped (05/21/23 0754)   lactated ringers Stopped (05/27/23 2100)   meropenem (MERREM) IV Stopped (05/28/23 4098)   micafungin (MYCAMINE) 200 mg in sodium chloride 0.9 % 100 mL IVPB 110 mL/hr at 05/28/23 0900   midazolam 11 mg/hr (05/28/23 0953)   norepinephrine (LEVOPHED) Adult infusion 10 mcg/min (05/28/23 0900)   prismasol BGK 4/2.5 1,500 mL/hr at 05/28/23 0954   vancomycin Stopped (05/27/23 2251)   vasopressin 0.04 Units/min (05/28/23 0911)    Scheduled Medications:  sodium chloride   Intravenous Once   sodium chloride   Intravenous Once   atorvastatin  80 mg Per Tube q1800   bisacodyl  10 mg Oral Daily   Or   bisacodyl  10 mg Rectal Daily   Chlorhexidine Gluconate Cloth  6 each Topical Daily   docusate  100 mg Per Tube BID   etomidate       ezetimibe  10 mg Per Tube Daily   feeding supplement (PROSource TF20)  60 mL Per Tube Q4H   insulin aspart  0-15 Units Subcutaneous Q4H   metoCLOPramide (REGLAN) injection  5 mg Intravenous Q6H   midodrine  5 mg Oral Q8H   multivitamin  1 tablet Per Tube BID   mouth rinse  15 mL Mouth Rinse Q2H   pantoprazole (PROTONIX) IV  40 mg Intravenous QHS   polyethylene glycol  17 g Per Tube BID   rocuronium bromide       sodium chloride flush  3 mL Intravenous Q12H    have reviewed scheduled and prn medications.  Physical Exam: General: Critically ill looking male, intubated, sedated, multiple lines Heart: S1S2 Lungs: Multiple lines and ECMO in the chest, diminished breath sounds bibasilar, intubated Abdomen:soft,  non-distended Extremities:  no sig edema b/l Les Neuro: sedated Dialysis Access: LIJ HD line   [name] 05/28/2023,9:56 AM  LOS: 10 days

## 2023-05-28 NOTE — Progress Notes (Signed)
ANTICOAGULATION CONSULT NOTE - Follow up Consult  Pharmacy Consult for bivalirudin Indication:  VA ECMO  Allergies  Allergen Reactions   Other Anaphylaxis    Mushrooms  Not listed on MAR    Plavix [Clopidogrel Bisulfate] Other (See Comments)    TTP Not listed on the [facility]   Fleet Enema [Enema] Other (See Comments)    Unknown reaction   Lovenox [Enoxaparin] Other (See Comments)    Unknown reaction   Morphine Other (See Comments)    Unknown reaction   Nsaids Other (See Comments)    Unknown reaction   Hydrocodone-Acetaminophen Itching    Patient Measurements: Height: 5\' 6"  (167.6 cm) Weight: 84.8 kg (186 lb 15.2 oz) IBW/kg (Calculated) : 63.8 Heparin Dosing Weight: 81.3 kg  Vital Signs: Temp: 97.9 F (36.6 C) (09/21 0700) Temp Source: Core (09/21 0400) Pulse Rate: 66 (09/21 0700)  Labs: Recent Labs    05/26/23 0404 05/26/23 0411 05/27/23 0307 05/27/23 0315 05/27/23 0432 05/27/23 0438 05/27/23 1611 05/27/23 1619 05/27/23 2204 05/27/23 2206 05/28/23 0411 05/28/23 0416  HGB 9.1*   < > 8.4*   < >  --    < > 8.1*   < > 7.7* 8.2* 8.9* 9.5*  HCT 27.0*   < > 25.6*   < >  --    < > 24.8*   < > 23.1* 24.0* 26.4* 28.0*  PLT 20*   < > 24*  --   --   --  19*  --   --   --  19*  --   APTT 68*   < >  --   --  73*  --  78*  --  77*  --  70*  --   LABPROT 18.6*  --   --   --  19.3*  --   --   --   --   --  19.7*  --   INR 1.5*  --   --   --  1.6*  --   --   --   --   --  1.7*  --   CREATININE 1.14   < > 1.20  --   --   --  1.36*  --   --   --  1.13  --    < > = values in this interval not displayed.    Estimated Creatinine Clearance: 68.3 mL/min (by C-G formula based on SCr of 1.13 mg/dL).   Assessment: 62 yom underwent Bentall/AVR, TV repair, re-implantation of SVG-RCA complicated with vasoplegia and pulmonary edema requiring central VA ECMO.  No AC PTA.  aPTT therapeutic at upper end of range at 70 seconds. Pltc remains low at 19k, H/H stable. Fibrinogen remains >800,  LDH >1000.   Goal of Therapy:  aPTT 50-70 seconds Monitor platelets by anticoagulation protocol: Yes   Plan:  Reduce bivalirudin to 0.007 mg/kg/hr  Monitor q12 hr aPTT and CBC, and for s/sx of bleeding.  Fredonia Highland, PharmD, BCPS, [facility] Clinical Pharmacist [phone] Please check AMION for all [facility] Pharmacy numbers 05/28/2023

## 2023-05-28 NOTE — Progress Notes (Signed)
NAME:  [name], MRN:  [id number], DOB:  12/29/1960, LOS: 10 ADMISSION DATE:  06/04/2023, CONSULTATION DATE:  9/11 REFERRING MD:  Dr. Izora Ribas, CHIEF COMPLAINT:  aortic valve dehiscence   History of Present Illness:  Patient is [name] w/ pertinent PMH CAD s/p CABG 2017 w/ AVR, HLD, HTN, prior CVA, T2DM, CKD4 was previously on HD presents to [facility] on 9/10 chest pain.  Patient recently admitted to [facility] on 5/25 w/ AKI and AMS. While in ED patient had PEA arrest w/ ROSC in about 8 minutes. Patient required dialysis on this admission. Also w/ MSSA bacteremia associated w/ tricuspid valve endocarditis. Patient transferred to [facility] on 5/25. Treated w/ 6 week course oxacillin and rifampin. This admission also suffered a R cerebellar CVA w/ MRI likely septic emboli and had cholangitis/pancreatitis requiring ERCP.  On 9/10 patient admitted to [facility] w/ chest pain, dyspnea, and BLE edema. BNP 2,097. CXR w/ pulm edema. Patient started on IV lasix infusion. Cards consulted. On 9/11 patient transferred to [facility]. Patient's echo showing possible aortic valve dehiscence. Cultures repeated and started on rocephin. Patient transferred to ICU for TEE and general anesthesia and to start CRRT. PCCM consulted.  Pertinent  Medical History   Past Medical History:  Diagnosis Date   Allergy    Anemia    Anxiety    Baker's cyst of knee    Blood transfusion without reported diagnosis    as baby    Coronary artery disease    quadruple bypass - March 2016   GERD (gastroesophageal reflux disease)    Gouty arthritis    "real bad" (01/17/2013)   Heart murmur    Hypercholesteremia    Hypertension    MSSA bacteremia 01/29/2023   Myocardial infarction (HCC) 2017   PEA (Pulseless electrical activity) (HCC) 01/29/2023   Stroke (HCC)    Type II diabetes mellitus (HCC)     Significant Hospital Events: Including procedures, antibiotic start and stop dates in addition to other pertinent events    9/10 admitted 9/11 echo showing aortic valve dehiscence, confirmed by TEE.  9/11 started (back) on CRRT for volume overload and known CKD 4 9/12 breathing better after CRRT Jun 17, 2023 to OR for Redo of aortic valve, tricuspid valve repair and ascending aortic root replacement w/ re-implantation of SVG to RCA. Post op TEE EF 55% RV mid-mod HK, AVR/TV repair stable. Could not come off CPB. Worse pulmonary edema. High dose pressors. Cannulated and started on VA ECMO w/ central cannulation  9/16 MDT ECMO rounds this AM, plans to remove more volume, 1 U PRBCs  9/18 bronch, clot and mucus plugs removed  9/19 OR for washout  9/20 bronch 9/21 bronch  Interim History / Subjective:  Remains on CRRT, pressors, VA ECMO  Objective   Blood pressure (!) 94/59, pulse 71, temperature 97.9 F (36.6 C), resp. rate (!) 0, height 5\' 6"  (1.676 m), weight 84.8 kg, SpO2 100%. PAP: (23-39)/(14-26) 30/19 CVP:  [1 mmHg-10 mmHg] 6 mmHg PCWP:  [19 mmHg] 19 mmHg CO:  [3.9 L/min-4.6 L/min] 3.9 L/min CI:  [2 L/min/m2-2.38 L/min/m2] 2 L/min/m2  Vent Mode: PRVC FiO2 (%):  [50 %] 50 % Set Rate:  [24 bmp] 24 bmp Vt Set:  [400 mL] 400 mL PEEP:  [5 cmH20] 5 cmH20 Plateau Pressure:  [25 cmH20-31 cmH20] 31 cmH20   Intake/Output Summary (Last 24 hours) at 05/28/2023 1122 Last data filed at 05/28/2023 1100 Gross per 24 hour  Intake 4427.96 ml  Output 3550 ml  Net 877.96 ml   Filed Weights   06/05/2023 0500 05/27/23 0500 05/28/23 0600  Weight: 78.4 kg 84 kg 84.8 kg    Examination: Sedated on vent Pupils equal, small Rhonci worse on R, [place] with consolidation, no effusion ECMO circuit looks okay Heavily sedated but coughs to suction Mild anasarca  ABG mildly acidemic Coox 78% Plts remain low, WBC high 40s, H/H okay LDH 1k Fibrinogen > 800 CXR still dense R sided infiltrates  Assessment & Plan:   Post cardiotomy vasoplegia/cardioplegia w/ failure to wean from CPB Acute on chronic combined HF  Hx MSSA  endocarditis S/p AVR/Bentall and reimplantation of Coronaries and TV repair  Acute respiratory failure w/ hypoxia due to volume overload, with untreated OSA CKD 4. Had recently been on HD R sided HCAP postop Circuit related hemolysis, thrombocytopenia consumptive L>R BLE swelling ->LE [place] neg for DVT CAD s/p CABG in 2017 w/ AVR HTN HLD Hx of CVA DMT2 Leukocytosis, multifactorial   - Pump changes over weekend per AHF/TCTS - Bival for [facility] - Balanced fluid balance via CRRT and transfusions per TCTS, AHF - Sweep for pH 7.35-7.4; maintain resp rate today; try higher PEEP if Bps allow to see if we can get better recruitment on right - Pressors for MAP 65 - Broad spectrum abx, f/u BAL culture - Insulin gtt - Heavy sedation for now - Repeat therapeutic bronch today  Best Practice (right click and "Reselect all SmartList Selections" daily)   Diet/type: TF DVT prophylaxis: bival GI prophylaxis: PPI Lines: Central line and Dialysis Catheter Foley: yes Code Status:  full code Last date of multidisciplinary goals of care discussion [mdt team discussions]  31 min cc time Myrla Halsted MD [place] 05/28/2023 11:22 AM

## 2023-05-28 NOTE — Procedures (Signed)
Bronchoscopy Procedure Note  [name]  [id number]  Dec 19, 1960  Date:05/28/23  Time:11:22 AM   Provider Performing:Allyn Bertoni C Katrinka Blazing   Procedure(s):  Subsequent Therapeutic Aspiration of Tracheobronchial Tree [phone])  Indication(s) Nonresolving PNA  Consent Part of ECMO consent  Anesthesia In place for mechanical ventilation   Time Out Verified patient identification, verified procedure, site/side was marked, verified correct patient position, special equipment/implants available, medications/allergies/relevant history reviewed, required imaging and test results available.   Sterile Technique Usual hand hygiene, masks, gowns, and gloves were used   Procedure Description Bronchoscope advanced through endotracheal tube and into airway.  Airways were examined down to subsegmental level with findings noted below.   Following diagnostic evaluation, Therapeutic aspiration performed in RUL, RLL, RML    Findings:  - Mildly irritated airways - ETT in good position - Ongoing heavy mucopurulent secretions on right   Complications/Tolerance None; patient tolerated the procedure well. Chest X-ray is not needed post procedure.   EBL Minimal   Specimen(s) None

## 2023-05-28 NOTE — Progress Notes (Signed)
Patient ID: [name], male   DOB: 1960/11/03, 62 y.o.   MRN: [id number]  TCTS evening rounds:  Hemodynamics unchanged on VA ECMO 3 l/min, NE 9, epi 10, vaso 0.04  Remains on CRRT slightly negative today.  Abdomen distended. No stool today.

## 2023-05-29 ENCOUNTER — Inpatient Hospital Stay (HOSPITAL_COMMUNITY): Payer: PPO

## 2023-05-29 DIAGNOSIS — I509 Heart failure, unspecified: Secondary | ICD-10-CM | POA: Diagnosis not present

## 2023-05-29 DIAGNOSIS — R570 Cardiogenic shock: Secondary | ICD-10-CM | POA: Diagnosis not present

## 2023-05-29 LAB — FUNGUS CULTURE WITH STAIN

## 2023-05-29 LAB — POCT I-STAT 7, (LYTES, BLD GAS, ICA,H+H)
Acid-base deficit: 1 mmol/L (ref 0.0–2.0)
Acid-base deficit: 2 mmol/L (ref 0.0–2.0)
Acid-base deficit: 3 mmol/L — ABNORMAL HIGH (ref 0.0–2.0)
Acid-base deficit: 4 mmol/L — ABNORMAL HIGH (ref 0.0–2.0)
Bicarbonate: 22.9 mmol/L (ref 20.0–28.0)
Bicarbonate: 23.3 mmol/L (ref 20.0–28.0)
Bicarbonate: 23.4 mmol/L (ref 20.0–28.0)
Bicarbonate: 24.5 mmol/L (ref 20.0–28.0)
Calcium, Ion: 1.15 mmol/L (ref 1.15–1.40)
Calcium, Ion: 1.17 mmol/L (ref 1.15–1.40)
Calcium, Ion: 1.19 mmol/L (ref 1.15–1.40)
Calcium, Ion: 1.2 mmol/L (ref 1.15–1.40)
HCT: 25 % — ABNORMAL LOW (ref 39.0–52.0)
HCT: 25 % — ABNORMAL LOW (ref 39.0–52.0)
HCT: 27 % — ABNORMAL LOW (ref 39.0–52.0)
HCT: 31 % — ABNORMAL LOW (ref 39.0–52.0)
Hemoglobin: 10.5 g/dL — ABNORMAL LOW (ref 13.0–17.0)
Hemoglobin: 8.5 g/dL — ABNORMAL LOW (ref 13.0–17.0)
Hemoglobin: 8.5 g/dL — ABNORMAL LOW (ref 13.0–17.0)
Hemoglobin: 9.2 g/dL — ABNORMAL LOW (ref 13.0–17.0)
O2 Saturation: 97 %
O2 Saturation: 97 %
O2 Saturation: 99 %
O2 Saturation: 99 %
Patient temperature: 36.1
Patient temperature: 36.3
Patient temperature: 36.4
Patient temperature: 36.5
Potassium: 3.9 mmol/L (ref 3.5–5.1)
Potassium: 3.9 mmol/L (ref 3.5–5.1)
Potassium: 4 mmol/L (ref 3.5–5.1)
Potassium: 4.1 mmol/L (ref 3.5–5.1)
Sodium: 135 mmol/L (ref 135–145)
Sodium: 136 mmol/L (ref 135–145)
Sodium: 136 mmol/L (ref 135–145)
Sodium: 137 mmol/L (ref 135–145)
TCO2: 24 mmol/L (ref 22–32)
TCO2: 25 mmol/L (ref 22–32)
TCO2: 25 mmol/L (ref 22–32)
TCO2: 26 mmol/L (ref 22–32)
pCO2 arterial: 43 mmHg (ref 32–48)
pCO2 arterial: 44.9 mmHg (ref 32–48)
pCO2 arterial: 45 mmHg (ref 32–48)
pCO2 arterial: 45.3 mmHg (ref 32–48)
pH, Arterial: 7.307 — ABNORMAL LOW (ref 7.35–7.45)
pH, Arterial: 7.321 — ABNORMAL LOW (ref 7.35–7.45)
pH, Arterial: 7.34 — ABNORMAL LOW (ref 7.35–7.45)
pH, Arterial: 7.342 — ABNORMAL LOW (ref 7.35–7.45)
pO2, Arterial: 102 mmHg (ref 83–108)
pO2, Arterial: 125 mmHg — ABNORMAL HIGH (ref 83–108)
pO2, Arterial: 126 mmHg — ABNORMAL HIGH (ref 83–108)
pO2, Arterial: 95 mmHg (ref 83–108)

## 2023-05-29 LAB — PREPARE RBC (CROSSMATCH)

## 2023-05-29 LAB — CBC WITH DIFFERENTIAL/PLATELET
Abs Immature Granulocytes: 11.48 10*3/uL — ABNORMAL HIGH (ref 0.00–0.07)
Basophils Absolute: 0 10*3/uL (ref 0.0–0.1)
Basophils Relative: 0 %
Eosinophils Absolute: 0 10*3/uL (ref 0.0–0.5)
Eosinophils Relative: 0 %
HCT: 24.5 % — ABNORMAL LOW (ref 39.0–52.0)
Hemoglobin: 7.9 g/dL — ABNORMAL LOW (ref 13.0–17.0)
Immature Granulocytes: 23 %
Lymphocytes Relative: 12 %
Lymphs Abs: 5.8 10*3/uL — ABNORMAL HIGH (ref 0.7–4.0)
MCH: 29.3 pg (ref 26.0–34.0)
MCHC: 32.2 g/dL (ref 30.0–36.0)
MCV: 90.7 fL (ref 80.0–100.0)
Monocytes Absolute: 6.3 10*3/uL — ABNORMAL HIGH (ref 0.1–1.0)
Monocytes Relative: 13 %
Neutro Abs: 25.7 10*3/uL — ABNORMAL HIGH (ref 1.7–7.7)
Neutrophils Relative %: 52 %
Platelets: 17 10*3/uL — CL (ref 150–400)
RBC: 2.7 MIL/uL — ABNORMAL LOW (ref 4.22–5.81)
RDW: 19.9 % — ABNORMAL HIGH (ref 11.5–15.5)
Smear Review: DECREASED
WBC: 49.2 10*3/uL — ABNORMAL HIGH (ref 4.0–10.5)
nRBC: 0.7 % — ABNORMAL HIGH (ref 0.0–0.2)

## 2023-05-29 LAB — CBC
HCT: 26.1 % — ABNORMAL LOW (ref 39.0–52.0)
HCT: 24.4 % — ABNORMAL LOW (ref 39.0–52.0)
Hemoglobin: 8.5 g/dL — ABNORMAL LOW (ref 13.0–17.0)
Hemoglobin: 7.8 g/dL — ABNORMAL LOW (ref 13.0–17.0)
MCH: 30.1 pg (ref 26.0–34.0)
MCH: 29.1 pg (ref 26.0–34.0)
MCHC: 32.6 g/dL (ref 30.0–36.0)
MCHC: 32 g/dL (ref 30.0–36.0)
MCV: 92.6 fL (ref 80.0–100.0)
MCV: 91 fL (ref 80.0–100.0)
Platelets: 20 10*3/uL — CL (ref 150–400)
Platelets: 17 10*3/uL — CL (ref 150–400)
RBC: 2.82 MIL/uL — ABNORMAL LOW (ref 4.22–5.81)
RBC: 2.68 MIL/uL — ABNORMAL LOW (ref 4.22–5.81)
RDW: 19.9 % — ABNORMAL HIGH (ref 11.5–15.5)
RDW: 19.8 % — ABNORMAL HIGH (ref 11.5–15.5)
WBC: 53.4 10*3/uL (ref 4.0–10.5)
WBC: 49.9 10*3/uL — ABNORMAL HIGH (ref 4.0–10.5)
nRBC: 0.9 % — ABNORMAL HIGH (ref 0.0–0.2)
nRBC: 0.7 % — ABNORMAL HIGH (ref 0.0–0.2)

## 2023-05-29 LAB — RENAL FUNCTION PANEL
Albumin: 2 g/dL — ABNORMAL LOW (ref 3.5–5.0)
Albumin: 1.9 g/dL — ABNORMAL LOW (ref 3.5–5.0)
Anion gap: 14 (ref 5–15)
Anion gap: 7 (ref 5–15)
BUN: 29 mg/dL — ABNORMAL HIGH (ref 8–23)
BUN: 30 mg/dL — ABNORMAL HIGH (ref 8–23)
CO2: 21 mmol/L — ABNORMAL LOW (ref 22–32)
CO2: 24 mmol/L (ref 22–32)
Calcium: 8.7 mg/dL — ABNORMAL LOW (ref 8.9–10.3)
Calcium: 7.9 mg/dL — ABNORMAL LOW (ref 8.9–10.3)
Chloride: 99 mmol/L (ref 98–111)
Chloride: 104 mmol/L (ref 98–111)
Creatinine, Ser: 1.11 mg/dL (ref 0.61–1.24)
Creatinine, Ser: 1.12 mg/dL (ref 0.61–1.24)
GFR, Estimated: >60 mL/min (ref >60)
GFR, Estimated: >60 mL/min (ref >60)
Glucose, Bld: 163 mg/dL — ABNORMAL HIGH (ref 70–99)
Glucose, Bld: 135 mg/dL — ABNORMAL HIGH (ref 70–99)
Phosphorus: 2.1 mg/dL — ABNORMAL LOW (ref 2.5–4.6)
Phosphorus: 2.2 mg/dL — ABNORMAL LOW (ref 2.5–4.6)
Potassium: 3.7 mmol/L (ref 3.5–5.1)
Potassium: 3.9 mmol/L (ref 3.5–5.1)
Sodium: 134 mmol/L — ABNORMAL LOW (ref 135–145)
Sodium: 135 mmol/L (ref 135–145)

## 2023-05-29 LAB — GLUCOSE, CAPILLARY
Glucose-Capillary: 139 mg/dL — ABNORMAL HIGH (ref 70–99)
Glucose-Capillary: 147 mg/dL — ABNORMAL HIGH (ref 70–99)
Glucose-Capillary: 160 mg/dL — ABNORMAL HIGH (ref 70–99)
Glucose-Capillary: 173 mg/dL — ABNORMAL HIGH (ref 70–99)
Glucose-Capillary: 175 mg/dL — ABNORMAL HIGH (ref 70–99)

## 2023-05-29 LAB — HEPATIC FUNCTION PANEL
ALT: 8 U/L (ref 0–44)
AST: 71 U/L — ABNORMAL HIGH (ref 15–41)
Albumin: 1.9 g/dL — ABNORMAL LOW (ref 3.5–5.0)
Alkaline Phosphatase: 100 U/L (ref 38–126)
Bilirubin, Direct: 1.2 mg/dL — ABNORMAL HIGH (ref 0.0–0.2)
Indirect Bilirubin: 1.1 mg/dL — ABNORMAL HIGH (ref 0.3–0.9)
Total Bilirubin: 2.3 mg/dL — ABNORMAL HIGH (ref 0.3–1.2)
Total Protein: 6.3 g/dL — ABNORMAL LOW (ref 6.5–8.1)

## 2023-05-29 LAB — APTT
aPTT: 71 seconds — ABNORMAL HIGH (ref 24–36)
aPTT: 65 seconds — ABNORMAL HIGH (ref 24–36)

## 2023-05-29 LAB — PROTIME-INR
INR: 1.6 — ABNORMAL HIGH (ref 0.8–1.2)
Prothrombin Time: 18.9 seconds — ABNORMAL HIGH (ref 11.4–15.2)

## 2023-05-29 LAB — FIBRINOGEN: Fibrinogen: >800 mg/dL — ABNORMAL HIGH (ref 210–475)

## 2023-05-29 LAB — LACTATE DEHYDROGENASE: LDH: 1200 U/L — ABNORMAL HIGH (ref 98–192)

## 2023-05-29 LAB — LACTIC ACID, PLASMA: Lactic Acid, Venous: 2.1 mmol/L (ref 0.5–1.9)

## 2023-05-29 MED ORDER — POTASSIUM CHLORIDE 20 MEQ PO PACK
20.0000 meq | PACK | Freq: Once | ORAL | Status: AC
  Administered 2023-05-29: 20 meq
  Filled 2023-05-29: qty 1

## 2023-05-29 MED ORDER — SODIUM CHLORIDE 0.9% IV SOLUTION: Freq: Once | INTRAVENOUS | Status: DC

## 2023-05-29 MED ORDER — POTASSIUM PHOSPHATES 15 MMOLE/5ML IV SOLN
15.0000 mmol | Freq: Once | INTRAVENOUS | Status: AC
  Administered 2023-05-29: 15 mmol via INTRAVENOUS
  Filled 2023-05-29: qty 5

## 2023-05-29 MED ORDER — MIDODRINE HCL 5 MG PO TABS
10.0000 mg | ORAL_TABLET | Freq: Three times a day (TID) | ORAL | Status: DC
  Administered 2023-05-29 – 2023-05-30 (×3): 10 mg
  Filled 2023-05-29 (×3): qty 2

## 2023-05-29 NOTE — Progress Notes (Signed)
[facility] NEPHROLOGY PROGRESS NOTE  Assessment/ Plan: 74M recently recovered GFR from dialysis dependent AKI with dehiscence  of prosthetic aortic value and severe AI/TR.   # Anuric AKI/dialysis dependent AKI with fluid overload: Started on RRT during previous admission in May 2024 until 05/10/23 (recovered). Patient is status post cardiac surgery on 9/13 and currently on ECMO. CRRT start 9/11. He remains anuric with fluid overload.  Continue to run CRRT at current prescription (resume post-op).  UF as tolerated by BP and ECMO, Angiomax for anticoagulation.  UFG: net even. Discussed with ICU RN at bedside.  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  Per CTS  # AHRF: VDRF, per primary service. Abx/antifungal per primary service  # Postcardiotomy vasoplegia/shock with failure to wean: Currently on ECMO per HF team.  On pressors per primary service.  Volume managing with CRRT. S/p washout 9/19. Started on midodrine.  # Hyperkalemia: Managed with CRRT prescription and follow labs. K WNL  # Anemia, thrombocytopenia: Transfuse as needed per primary team.  Discussed with ICU RN.  Subjective:  Patient seen and examined on CRRT. Running net even. Large stool output (~0.9L) yesterday, distended abd Pressors: epi, NE, vaso Net neg ~1.1L  Objective Vital signs in last 24 hours: Vitals:   05/29/23 0800 05/29/23 0815 05/29/23 0830 05/29/23 0845  BP:      Pulse: 75 79 81 78  Resp: (!) 24 (!) 24 (!) 24 19  Temp: (!) 97.5 F (36.4 C) 97.7 F (36.5 C) (!) 97.5 F (36.4 C) (!) 97.5 F (36.4 C)  TempSrc: Core     SpO2: 100% 100% 100% 100%  Weight:      Height:       Weight change: -2.1 kg  Intake/Output Summary (Last 24 hours) at 05/29/2023 0924 Last data filed at 05/29/2023 0900 Gross per 24 hour  Intake 4196.06 ml  Output 5309 ml  Net -1112.94 ml       Labs: RENAL  PANEL Recent Labs  Lab 05/24/23 0411 05/24/23 0413 05/24/23 1600 05/24/23 1602 05/25/23 0412 05/25/23 0421 05/26/23 0404 05/26/23 0411 05/27/23 0307 05/27/23 0315 05/27/23 1611 05/27/23 1619 05/28/23 0411 05/28/23 0416 05/28/23 1558 05/28/23 1600 05/29/23 0054 05/29/23 0407 05/29/23 0751  NA 134*   < > 136   < > 138   < > 135   < > 136   < > 137   < > 136   < > 135 135 137 134* 136  K 3.8   < > 3.5   < > 3.4*   < > 4.0   < > 4.3   < > 4.0   < > 4.1   < > 3.6 3.7 3.9 3.7 3.9  CL 103  --  105  --  105   < > 104   < > 99  --  103  --  103  --  103  --   --  99  --   CO2 22  --  25  --  23   < > 22   < > 23  --  23  --  23  --  24  --   --  21*  --   GLUCOSE 115*  --  154*  --  225*   < > 118*   < > 107*  --  114*  --  142*  --  152*  --   --  163*  --   BUN 11  --  14  --  24*   < > 21   < > 17  --  14  --  16  --  20  --   --  29*  --   CREATININE 1.24  --  1.29*  --  1.19   < > 1.14   < > 1.20  --  1.36*  --  1.13  --  1.14  --   --  1.11  --   CALCIUM 8.1*  --  7.8*  --  8.1*   < > 8.2*   < > 8.2*  --  8.1*  --  8.4*  --  8.4*  --   --  8.7*  --   MG 2.4  --  2.5*  --  2.4  --  2.5*  --  2.3  --   --   --   --   --   --   --   --   --   --   PHOS 1.7*  --  1.7*  --  <1.0*   < > 1.7*   < > 2.2*  --  2.5  --  1.8*  --  1.4*  --   --  2.1*  --   ALBUMIN 2.4*  2.4*  --  2.3*  --  2.2*  2.2*   < > 2.3*  2.3*   < > 2.2*  2.2*  --  2.0*  --  2.1*  --  2.0*  --   --  1.9*  2.0*  --    < > = values in this interval not displayed.    Liver Function Tests: Recent Labs  Lab 05/27/23 0307 05/27/23 1611 05/28/23 0411 05/28/23 1558 05/29/23 0407  AST 68*  --  60*  --  71*  ALT 11  --  9  --  8  ALKPHOS 85  --  85  --  100  BILITOT 1.5*  --  2.1*  --  2.3*  PROT 5.8*  --  5.9*  --  6.3*  ALBUMIN 2.2*  2.2*   < > 2.1* 2.0* 1.9*  2.0*   < > = values in this interval not displayed.   No results for input(s): "LIPASE", "AMYLASE" in the last 168 hours. No results for input(s):  "AMMONIA" in the last 168 hours. CBC: Recent Labs    08/12/22 0934 01/28/23 2355 05/17/23 2116 05/18/23 0411 05/19/23 0636 05/20/23 0309 05/28/23 1558 05/28/23 1600 05/29/23 0054 05/29/23 0407 05/29/23 0751  HGB 10.2*   < > 7.6*   < > 8.1*   < > 8.7* 9.5* 8.5* 8.5* 8.5*  MCV 94   < >  --    < > 89.3   < > 91.4  --   --  92.6  --   VITAMINB12  --   --   --   --  701  --   --   --   --   --   --   FERRITIN 537*  --  2,200*  --   --   --   --   --   --   --   --   TIBC 264  --  204*  --   --   --   --   --   --   --   --   IRON 83  --  140  --   --   --   --   --   --   --   --    < > =  values in this interval not displayed.    Cardiac Enzymes: No results for input(s): "CKTOTAL", "CKMB", "CKMBINDEX", "TROPONINI" in the last 168 hours. CBG: Recent Labs  Lab 05/28/23 1234 05/28/23 1601 05/28/23 2003 05/29/23 0007 05/29/23 0410  GLUCAP 141* 157* 127* 139* 160*    Iron Studies: No results for input(s): "IRON", "TIBC", "TRANSFERRIN", "FERRITIN" in the last 72 hours. Studies/Results: DG CHEST PORT 1 VIEW  Result Date: 05/29/2023 CLINICAL DATA:  [id number] Personal history of ECMO [id number] EXAM: PORTABLE CHEST 1 VIEW COMPARISON:  September 19th -21 2024 FINDINGS: The cardiomediastinal silhouette is unchanged in contour.ECMO cannulation support apparatus. Overlying tubing and hemostats. Carina is challenging to visualize but the ETT tip is estimated to terminate 19 mm above the carina. LEFT IJ PA catheter tip terminates over the main pulmonary artery. Bilateral chest tubes. Midline drain. RIGHT chest CVC tip terminates over the superior cavoatrial junction. The enteric tube courses through the chest to the abdomen beyond the field-of-view. Temporary cardiac pacing wires. No pneumothorax. Similar appearance of near complete opacification of the RIGHT lung, not significantly changed. LEFT basilar opacities, not significantly changed. IMPRESSION: 1. Similar appearance of near complete  opacification of the RIGHT lung. 2. Support apparatus as described above. Electronically Signed   By: Meda Klinefelter M.D.   On: 05/29/2023 08:59   DG CHEST PORT 1 VIEW  Result Date: 05/28/2023 CLINICAL DATA:  [id number] Personal history of ECMO [id number] EXAM: PORTABLE CHEST 1 VIEW COMPARISON:  May 27, 2023, May 26, 2023. FINDINGS: The cardiomediastinal silhouette is unchanged and obscure.ETT tip terminates 2.6 cm above the carina. ECMO cannulation support apparatus. RIGHT chest CVC tip terminates over the superior cavoatrial junction. LEFT IJ PA catheter tip terminates over the expected main PA. Bilateral chest tubes and midline surgical drains. Overlying tubing and hemostats. The enteric tube courses through the chest to the abdomen beyond the field-of-view. No significant pneumothorax. Persistent heterogeneous opacification of the near entirety of the RIGHT lung, favored not significantly changed in September 19th. Mild aeration of the RIGHT apex. Similar appearance of hazy opacities throughout the LEFT lung with a background interstitial prominence. IMPRESSION: 1. Support apparatus as described above. 2. Similar appearance of bilateral RIGHT greater than LEFT airspace opacities Electronically Signed   By: Meda Klinefelter M.D.   On: 05/28/2023 09:20   DG Abd 1 View  Result Date: 05/27/2023 CLINICAL DATA:  Encounter for nasogastric tube placement. EXAM: ABDOMEN - 1 VIEW COMPARISON:  One-view abdomen 05/26/2023 FINDINGS: Is bilateral chest tubes are in place. Mediastinal drain is in place. ECMO catheters are stable. A small bore feeding tube terminates in the distal gastric antrum or proximal duodenum. Right-sided airspace disease is again noted. IMPRESSION: 1. Small bore feeding tube terminates in the distal gastric antrum or proximal duodenum. 2. Support apparatus otherwise. Electronically Signed   By: Marin Roberts M.D.   On: 05/27/2023 11:27    Medications: Infusions:   prismasol  BGK 4/2.5 400 mL/hr at 05/29/23 0039    prismasol BGK 4/2.5 400 mL/hr at 05/29/23 0039   sodium chloride Stopped (05/24/23 0032)   sodium chloride Stopped (05/22/23 1310)   sodium chloride 10 mL/hr at 05/29/23 0900   albumin human Stopped (05/27/23 1852)   anticoagulant sodium citrate     bivalirudin (ANGIOMAX) 250 mg in sodium chloride 0.9 % 500 mL (0.5 mg/mL) infusion 0.005 mg/kg/hr (05/29/23 0900)   dexmedetomidine (PRECEDEX) IV infusion 0.7 mcg/kg/hr (05/29/23 0900)   epinephrine 10 mcg/min (05/29/23 0900)   feeding supplement (PIVOT 1.5 CAL)  20 mL/hr at 05/29/23 0900   HYDROmorphone 4 mg/hr (05/29/23 0919)   insulin Stopped (05/21/23 0754)   lactated ringers Stopped (05/27/23 2100)   meropenem (MERREM) IV Stopped (05/29/23 1610)   micafungin (MYCAMINE) 200 mg in sodium chloride 0.9 % 100 mL IVPB Stopped (05/29/23 0853)   midazolam 11 mg/hr (05/29/23 0900)   norepinephrine (LEVOPHED) Adult infusion 12 mcg/min (05/29/23 0900)   potassium PHOSPHATE IVPB (in mmol) 15 mmol (05/29/23 0900)   prismasol BGK 4/2.5 1,500 mL/hr at 05/29/23 0903   vancomycin Stopped (05/28/23 2249)   vasopressin 0.04 Units/min (05/29/23 0900)    Scheduled Medications:  sodium chloride   Intravenous Once   sodium chloride   Intravenous Once   atorvastatin  80 mg Per Tube q1800   bisacodyl  10 mg Oral Daily   Or   bisacodyl  10 mg Rectal Daily   Chlorhexidine Gluconate Cloth  6 each Topical Daily   docusate  100 mg Per Tube BID   ezetimibe  10 mg Per Tube Daily   feeding supplement (PROSource TF20)  60 mL Per Tube Q4H   insulin aspart  0-15 Units Subcutaneous Q4H   metoCLOPramide (REGLAN) injection  5 mg Intravenous Q6H   midodrine  10 mg Per Tube Q8H   multivitamin  1 tablet Per Tube BID   mouth rinse  15 mL Mouth Rinse Q2H   pantoprazole (PROTONIX) IV  40 mg Intravenous QHS   polyethylene glycol  17 g Per Tube BID   sodium chloride flush  3 mL Intravenous Q12H    have reviewed scheduled and prn  medications.  Physical Exam: General: Critically ill looking male, intubated, sedated, multiple lines Heart: S1S2 Lungs: Multiple lines and ECMO in the chest, diminished breath sounds bibasilar, intubated Abdomen:distended Extremities: no sig edema b/l Les Neuro: sedated Dialysis Access: LIJ HD line   [name] 05/29/2023,9:24 AM  LOS: 11 days

## 2023-05-29 NOTE — Progress Notes (Signed)
PHARMACY - TOTAL PARENTERAL NUTRITION CONSULT NOTE   Indication:  Tube Feed Intolerance  Pharmacy received consult for new TPN initiation due to intolerance of tube feeds. Has been receiving trickle TF since 9/20 @1200  - OG to suction tonight with 2L output. Has had small bowel movements daily over the past few days.  TPN labs ordered for 9/23 and will have pharmacist review in the morning for initiation.  Eldridge Scot, PharmD, BCCCP Clinical Pharmacist 05/29/2023, 6:49 PM

## 2023-05-29 NOTE — Progress Notes (Signed)
Patient ID: [name], male   DOB: [birth year], 62 y.o.   MRN: [id number]     Advanced Heart Failure Rounding Note  PCP-Cardiologist: Kristeen Miss, MD   Subjective:    9/13: OR for Bentall/AVR, TV repair, re-implantation of SVG-RCA.  Post-op vasoplegia and pulmonary edema, required central VA ECMO (RA drainage, ascending aorta return.  Post-op TEE EF 55%, mild-moderate RV hypokinesis.  9/14: Blender added to circuit due to high PaO2 9/16: TEE with EF 30%, moderate LVH, moderate RV dysfunction with mild enlargement, stable bioprosthetic aortic valve, repaired TV with mild TR, no endocarditis noted.  9/18: Bronchoscopy with mucus plugs and clots. 06/20/23: To OR for mediastinal washout.  By TEE, EF 50% with moderate LVH, mild RV enlargement and mild RV dysfunction, stable bioprosthetic aortic valve, repaired TV with mild TR.  He did not tolerate lowering of ECMO flows even with increasing of pressors due to vasoplegia.  9/21: Bronchoscopy  Remains intubated/sedated. On Central [facility] ECMO. MAPs 65-70 on epinephrine 10, NE 13,  and vasopressin 0.04. CVVH running even.  CXR unchanged with diffuse right lung airspace disease.   H/o MSSA endocarditis, currently on meropenem/vancomycin/micafungin. WBCs 54 => 53 => 45 => 48.7 => 48 => 42 => 53K  Hgb 8.5, plts 43 => 38 => 28 => 20 => 24 => 19 => 20k No bleeding  [place]wan: CVP 7 PA 30/19 SVR 1323  Rhythm = junctional  VA ECMO 3000 rpm Flow 3.0 L/min pVen -14 Delta P 24 Sweep 4.5 LDH 1077 => 1200 Lactate 1.8 => 2/1 ABG 7.31/45/102   Objective:   Weight Range: 82.7 kg Body mass index is 29.43 kg/m.   Vital Signs:   Temp:  [97.5 F (36.4 C)-98.1 F (36.7 C)] 97.7 F (36.5 C) (09/22 0730) Pulse Rate:  [59-121] 81 (09/22 0730) Resp:  [0-24] 24 (09/22 0730) BP: (96-101)/(65-66) 96/65 (09/21 1506) SpO2:  [99 %-100 %] 100 % (09/22 0730) Arterial Line BP: (60-112)/(-1-72) 95/59 (09/22 0730) FiO2 (%):  [50 %] 50 % (09/22 0400) Weight:   [82.7 kg] 82.7 kg (09/22 0500) Last BM Date : 05/29/23  Weight change: Filed Weights   05/27/23 0500 05/28/23 0600 05/29/23 0500  Weight: 84 kg 84.8 kg 82.7 kg    Intake/Output:   Intake/Output Summary (Last 24 hours) at 05/29/2023 0811 Last data filed at 05/29/2023 0800 Gross per 24 hour  Intake 4222.23 ml  Output 5232 ml  Net -1009.77 ml      Physical Exam    General: NAD Neck: No JVD, no thyromegaly or thyroid nodule.  Lungs: Decreased bilaterally CV: Chest wall open.  Heart regular S1/S2, no S3/S4, no murmur.  No peripheral edema.   Abdomen: Soft, nontender, no hepatosplenomegaly, no distention.  Skin: Intact without lesions or rashes.  Neurologic: Sedated on vent Extremities: No clubbing or cyanosis.  HEENT: Normal.   Telemetry   Junctional rhythm 70s (personally reviewed)   Labs    CBC Recent Labs    05/28/23 1558 05/28/23 1600 05/29/23 0407 05/29/23 0751  WBC 48.5*  --  53.4*  --   HGB 8.7*   < > 8.5* 8.5*  HCT 26.6*   < > 26.1* 25.0*  MCV 91.4  --  92.6  --   PLT 20*  --  20*  --    < > = values in this interval not displayed.   Basic Metabolic Panel Recent Labs    11/91/47 0307 05/27/23 0315 05/28/23 1558 05/28/23 1600 05/29/23 0407 05/29/23 0751  NA  136   < > 135   < > 134* 136  K 4.3   < > 3.6   < > 3.7 3.9  CL 99   < > 103  --  99  --   CO2 23   < > 24  --  21*  --   GLUCOSE 107*   < > 152*  --  163*  --   BUN 17   < > 20  --  29*  --   CREATININE 1.20   < > 1.14  --  1.11  --   CALCIUM 8.2*   < > 8.4*  --  8.7*  --   MG 2.3  --   --   --   --   --   PHOS 2.2*   < > 1.4*  --  2.1*  --    < > = values in this interval not displayed.   Liver Function Tests Recent Labs    05/28/23 0411 05/28/23 1558 05/29/23 0407  AST 60*  --  71*  ALT 9  --  8  ALKPHOS 85  --  100  BILITOT 2.1*  --  2.3*  PROT 5.9*  --  6.3*  ALBUMIN 2.1* 2.0* 1.9*  2.0*   No results for input(s): "LIPASE", "AMYLASE" in the last 72 hours. Cardiac  Enzymes No results for input(s): "CKTOTAL", "CKMB", "CKMBINDEX", "TROPONINI" in the last 72 hours.  BNP: BNP (last 3 results) Recent Labs    06/05/2023 1100  BNP 2,097.0*    ProBNP (last 3 results) No results for input(s): "PROBNP" in the last 8760 hours.   D-Dimer No results for input(s): "DDIMER" in the last 72 hours. Hemoglobin A1C No results for input(s): "HGBA1C" in the last 72 hours.  Fasting Lipid Panel No results for input(s): "CHOL", "HDL", "LDLCALC", "TRIG", "CHOLHDL", "LDLDIRECT" in the last 72 hours. Thyroid Function Tests No results for input(s): "TSH", "T4TOTAL", "T3FREE", "THYROIDAB" in the last 72 hours.  Invalid input(s): "FREET3"  Other results:   Imaging    No results found.   Medications:     Scheduled Medications:  sodium chloride   Intravenous Once   sodium chloride   Intravenous Once   atorvastatin  80 mg Per Tube q1800   bisacodyl  10 mg Oral Daily   Or   bisacodyl  10 mg Rectal Daily   Chlorhexidine Gluconate Cloth  6 each Topical Daily   docusate  100 mg Per Tube BID   ezetimibe  10 mg Per Tube Daily   feeding supplement (PROSource TF20)  60 mL Per Tube Q4H   insulin aspart  0-15 Units Subcutaneous Q4H   metoCLOPramide (REGLAN) injection  5 mg Intravenous Q6H   midodrine  5 mg Per Tube Q8H   multivitamin  1 tablet Per Tube BID   mouth rinse  15 mL Mouth Rinse Q2H   pantoprazole (PROTONIX) IV  40 mg Intravenous QHS   polyethylene glycol  17 g Per Tube BID   potassium chloride  20 mEq Per Tube Once   sodium chloride flush  3 mL Intravenous Q12H    Infusions:   prismasol BGK 4/2.5 400 mL/hr at 05/29/23 0039    prismasol BGK 4/2.5 400 mL/hr at 05/29/23 0039   sodium chloride Stopped (05/24/23 0032)   sodium chloride Stopped (05/22/23 1310)   sodium chloride 10 mL/hr at 05/29/23 0800   albumin human Stopped (05/27/23 1852)   anticoagulant sodium citrate     bivalirudin (  ANGIOMAX) 250 mg in sodium chloride 0.9 % 500 mL (0.5 mg/mL)  infusion 0.005 mg/kg/hr (05/29/23 0800)   dexmedetomidine (PRECEDEX) IV infusion 0.7 mcg/kg/hr (05/29/23 0800)   epinephrine 10 mcg/min (05/29/23 0800)   feeding supplement (PIVOT 1.5 CAL) 20 mL/hr at 05/29/23 0800   HYDROmorphone 4 mg/hr (05/29/23 0800)   insulin Stopped (05/21/23 0754)   lactated ringers Stopped (05/27/23 2100)   meropenem (MERREM) IV Stopped (05/29/23 1478)   micafungin (MYCAMINE) 200 mg in sodium chloride 0.9 % 100 mL IVPB 110 mL/hr at 05/29/23 0800   midazolam 11 mg/hr (05/29/23 0800)   norepinephrine (LEVOPHED) Adult infusion 12 mcg/min (05/29/23 0800)   potassium PHOSPHATE IVPB (in mmol)     prismasol BGK 4/2.5 1,500 mL/hr at 05/29/23 0538   vancomycin Stopped (05/28/23 2249)   vasopressin 0.04 Units/min (05/29/23 0800)    PRN Medications: sodium chloride, sodium chloride, albumin human, anticoagulant sodium citrate, artificial tears, dextrose, HYDROmorphone, midazolam, midazolam, morphine injection, ondansetron (ZOFRAN) IV, mouth rinse, oxyCODONE, sodium chloride flush, traMADol    Assessment/Plan   1. Post-cardiotomy vasoplegia/shock with failure to wean from CPB: Now on central VA ECMO (05/23/2023) with RA drainage/ascending aorta return. Post-op echo with EF 55%, mild-moderate RV dysfunction. Unable to get images yesterday by TTE.  TEE on 9/16 showed EF 30%, moderate LVH, moderate RV dysfunction with mild enlargement, stable bioprosthetic aortic valve, repaired TV with mild TR, no endocarditis noted.  To OR for mediastinal washout on 9/19.  By TEE 9/19, EF 50% with moderate LVH, mild RV enlargement and mild RV dysfunction, stable bioprosthetic aortic valve, repaired TV with mild TR.  He did not tolerate lowering of ECMO flows even with increasing of pressors due to vasoplegia. CXR with clearing left lung, right lung with persistent airspace disease not clearing with CVVH.  Impella 5.5 considered in OR, but does not have access for this and with improved LV function  not sure it would be helpful. ECMO flow now at 3.0 L/min. CVVH now running even. Plan was to to slowly lower ECMO flow over the weekend, aim for 0.5 L/min/day.  Today, he is on NE 13, epinephrine 10, vasopressin 0.04.  CVP 7.  - I decreased flow to 2.7 L/min today, MAP dropped and we increased back to 3 L/min.  - Continue current pressor support.  - Increase midodrine to 10 tid.  - Continue to run CVVH even, aim for CVP around 7 for better hemodynamics.   - Continue bivalirudin today with goal PTT 50-70. Discussed dosing with PharmD personally. - Prognosis concerning, we are unable to wean from [facility] ECMO so far.  2. Acute hypoxic respiratory failure, post-op: On vent, rest settings. Blender added to keep PaO2 down. Sweep remains 4.5. CXR with some clearing of left lung, right lung with ongoing diffuse airspace disease without change.  Suspect extensive PNA, think we have done what we can with CVVH for lung clearing at this point. Keep CVP < 8. Bronchoscopy yesterday with no change in CXR.  - Keep CVVH even today.  - Meropenem/vancomycin/micafungin for coverage of PNA 3. Severe prosthetic AI: Due to previous MSSA endocarditis with partial valve dehiscence s/p re-do AVR/Bentall with reimplantation of SVG-RCA and TV repair 05/16/2023.   - Continuing meropenem/vancomycin/micafungin  4. CAD s/p CABG/AVR 2017:  s/p AVR/Bentall and re-implantation of SVG-RCA 05/28/2023. - Off ASA with platelets down.  5. ESRD: CVVH as above.  6. DM2: management per CCM 7. Heart block: Post-op, now in stable junctional rhythm.   8. H/o CVA:  Prior cerebellar CVA.  9. Thrombocytopenia: Post-op, 43 => 38 => 28 => 20 => 24 K => 19 => 20K today.  Suspect inflammatory and hemolysis.  10. Anemia: Post-op, transfuse hgb < 8. 8.5 today. 11. ID: H/o MSSA bacteremia.  WBCs 54 => 53 => 45 => 48.7 => 48K => 42 => 53K now.  - Meropenem + vancomycin.  - Micafungin for fungal coverage added 9/20 12. FEN: TFs restarted slowly via [place].   Suspect aspiration last week.    CRITICAL CARE Performed by: [name]  Total critical care time: 45 minutes  Critical care time was exclusive of separately billable procedures and treating other patients.  Critical care was necessary to treat or prevent imminent or life-threatening deterioration.  Critical care was time spent personally by me on the following activities: development of treatment plan with patient and/or surrogate as well as nursing, discussions with consultants, evaluation of patient's response to treatment, examination of patient, obtaining history from patient or surrogate, ordering and performing treatments and interventions, ordering and review of laboratory studies, ordering and review of radiographic studies, pulse oximetry and re-evaluation of patient's condition.    Length of Stay: 89  [name], MD  05/29/2023, 8:11 AM  Advanced Heart Failure Team Pager [phone] (M-F; 7a - 5p)  Please contact [facility] for night-coverage after hours (5p -7a ) and weekends on [email or web address]

## 2023-05-29 NOTE — Progress Notes (Signed)
Patient ID: [name], male   DOB: 04/09/1961, 62 y.o.   MRN: [id number] TCTS evening Rounds:  Hemodynamics stable.  NE down to 2 mcg. Epi still 10, vaso 0.04  OG put to suction today and 2L removed with decrease in abd distention. Will stop tube feeds since he has not had any BS and plan to start TNA tomorrow.  CBC    Component Value Date/Time   WBC 49.2 (H) 05/29/2023 1722   RBC 2.70 (L) 05/29/2023 1722   HGB 7.9 (L) 05/29/2023 1722   HGB 6.4 (LL) 05/17/2023 0852   HGB 10.2 (L) 08/12/2022 0934   HCT 24.5 (L) 05/29/2023 1722   HCT 29.2 (L) 08/12/2022 0934   PLT 17 (LL) 05/29/2023 1722   PLT 109 (L) 05/17/2023 0852   PLT 252 08/12/2022 0934   MCV 90.7 05/29/2023 1722   MCV 94 08/12/2022 0934   MCH 29.3 05/29/2023 1722   MCHC 32.2 05/29/2023 1722   RDW 19.9 (H) 05/29/2023 1722   RDW 15.5 (H) 08/12/2022 0934   LYMPHSABS 5.8 (H) 05/29/2023 1722   LYMPHSABS 1.9 11/04/2021 1028   MONOABS 6.3 (H) 05/29/2023 1722   EOSABS 0.0 05/29/2023 1722   EOSABS 0.2 11/04/2021 1028   BASOSABS 0.0 05/29/2023 1722   BASOSABS 0.1 11/04/2021 1028   Will transfuse 1 unit PRBC's tonight. Follow plt count.

## 2023-05-29 NOTE — Progress Notes (Addendum)
NAME:  [name], MRN:  [id number], DOB:  March 13, 1961, LOS: 11 ADMISSION DATE:  05/18/2023, CONSULTATION DATE:  9/11 REFERRING MD:  Dr. Izora Ribas, CHIEF COMPLAINT:  aortic valve dehiscence   History of Present Illness:  Patient is [name] w/ pertinent PMH CAD s/p CABG 2017 w/ AVR, HLD, HTN, prior CVA, T2DM, CKD4 was previously on HD presents to [facility] on 9/10 chest pain.  Patient recently admitted to [facility] on 5/25 w/ AKI and AMS. While in ED patient had PEA arrest w/ ROSC in about 8 minutes. Patient required dialysis on this admission. Also w/ MSSA bacteremia associated w/ tricuspid valve endocarditis. Patient transferred to [facility] on 5/25. Treated w/ 6 week course oxacillin and rifampin. This admission also suffered a R cerebellar CVA w/ MRI likely septic emboli and had cholangitis/pancreatitis requiring ERCP.  On 9/10 patient admitted to [facility] w/ chest pain, dyspnea, and BLE edema. BNP 2,097. CXR w/ pulm edema. Patient started on IV lasix infusion. Cards consulted. On 9/11 patient transferred to [facility]. Patient's echo showing possible aortic valve dehiscence. Cultures repeated and started on rocephin. Patient transferred to ICU for TEE and general anesthesia and to start CRRT. PCCM consulted.  Pertinent  Medical History   Past Medical History:  Diagnosis Date   Allergy    Anemia    Anxiety    Baker's cyst of knee    Blood transfusion without reported diagnosis    as baby    Coronary artery disease    quadruple bypass - March 2016   GERD (gastroesophageal reflux disease)    Gouty arthritis    "real bad" (01/17/2013)   Heart murmur    Hypercholesteremia    Hypertension    MSSA bacteremia 01/29/2023   Myocardial infarction (HCC) 2017   PEA (Pulseless electrical activity) (HCC) 01/29/2023   Stroke (HCC)    Type II diabetes mellitus (HCC)     Significant Hospital Events: Including procedures, antibiotic start and stop dates in addition to other pertinent events    9/10 admitted 9/11 echo showing aortic valve dehiscence, confirmed by TEE.  9/11 started (back) on CRRT for volume overload and known CKD 4 9/12 breathing better after CRRT 9/13 to OR for Redo of aortic valve, tricuspid valve repair and ascending aortic root replacement w/ re-implantation of SVG to RCA. Post op TEE EF 55% RV mid-mod HK, AVR/TV repair stable. Could not come off CPB. Worse pulmonary edema. High dose pressors. Cannulated and started on VA ECMO w/ central cannulation  9/16 MDT ECMO rounds this AM, plans to remove more volume, 1 U PRBCs  9/18 bronch, clot and mucus plugs removed  9/19 OR for washout  9/20 bronch 9/21 bronch  Interim History / Subjective:  Abd distension improved with relistor Stable night Gtt: bival, precedex, epi 10, dilaudid 4, versed 11, vaso 0.04  Objective   Blood pressure 96/65, pulse 77, temperature (!) 97.5 F (36.4 C), resp. rate (!) 24, height 5\' 6"  (1.676 m), weight 82.7 kg, SpO2 100%. PAP: (26-37)/(14-28) 29/19 CVP:  [3 mmHg-17 mmHg] 7 mmHg PCWP:  [20 mmHg-22 mmHg] 20 mmHg CO:  [3.7 L/min-4.5 L/min] 4.1 L/min CI:  [1.9 L/min/m2-2.3 L/min/m2] 2.08 L/min/m2  Vent Mode: PRVC FiO2 (%):  [50 %] 50 % Set Rate:  [24 bmp] 24 bmp Vt Set:  [400 mL] 400 mL PEEP:  [5 cmH20-50 cmH20] 5 cmH20 Pressure Support:  [5 cmH20] 5 cmH20 Plateau Pressure:  [29 cmH20-31 cmH20] 31 cmH20   Intake/Output Summary (  Last 24 hours) at 05/29/2023 0732 Last data filed at 05/29/2023 0600 Gross per 24 hour  Intake 4002.83 ml  Output 5130 ml  Net -1127.17 ml   Filed Weights   05/27/23 0500 05/28/23 0600 05/29/23 0500  Weight: 84 kg 84.8 kg 82.7 kg    Examination: Sedated on vent Pupils ~61mm equal Continued rhonci on exam Driving pressure 25 (11-9), PEEP increases worsened Bps Global mild anasarca Minimal drain output ECMO circuit looks okay LDH up 1200 from 1000 ABG mildly acidemic Profound leukamoid reaction and severe thrombocytopenia stable LDH 1k CXR  still dense R sided infiltrates, no changes after bronchs  Assessment & Plan:   Post cardiotomy vasoplegia/cardioplegia w/ failure to wean from CPB Acute on chronic combined HF  Hx MSSA endocarditis S/p AVR/Bentall and reimplantation of Coronaries and TV repair  Acute respiratory failure w/ hypoxia due to volume overload, with untreated OSA CKD 4. Had recently been on HD- now on CRRT R sided HCAP postop- no growth on culture, therapeutic bronchoscopy x 3 dose for incessant R mucus plugging; unfortunately no improvement with this radiographically or by vent needs Circuit related hemolysis, thrombocytopenia consumptive- stable, no transfusion needs at present L>R BLE swelling ->LE [place] neg for DVT- stable TF intolerance- ongoing issue CAD s/p CABG in 2017 w/ AVR HTN HLD Hx of CVA DMT2 Leukocytosis, multifactorial   - Pump changes per AHF/TCTS - Bival for AC - Balanced fluid balance via CRRT and transfusions per TCTS, AHF - Sweep for pH 7.35-7.4; increase a bit this am - Pressors for MAP 65; midodrine added 9/21 - Broad spectrum abx, f/u BAL culture, still NGTD - Insulin gtt - Heavy sedation for now - Overall not much progress, may need to speak with family RE: GOC if continues to languish - Discussed above with AHF+TCTS+ECMO specialists  Best Practice (right click and "Reselect all SmartList Selections" daily)   Diet/type: TF DVT prophylaxis: bival GI prophylaxis: PPI Lines: Central line and Dialysis Catheter Foley: yes Code Status:  full code Last date of multidisciplinary goals of care discussion [mdt team discussions]  35 min cc time Myrla Halsted MD [facility] 05/29/2023 7:32 AM

## 2023-05-29 NOTE — Progress Notes (Signed)
Patient ID: [name], male   DOB: 06-09-1961, 62 y.o.   MRN: [id number]   Extracorporeal support note   ECLS support day: Indication: Post-op vasoplegia and pulmonary edema  Configuration: Central VA ECMO  Drainage cannula: RA Return cannula: Ascending aorta  Pump speed: 3000 rpm  Pump flow: 3.0 L/min Pump used: Cardiohelp  Sweep gas: 4.5 with blender at 85%to lower PaO2  Circuit check: Small clot at 12p Anticoagulant: Bivalirudin Anticoagulation targets: PTT 50-70  Changes in support: none  Anticipated goals/duration of support: Wean to decannulation  .[name]  05/29/2023, 8:09 AM

## 2023-05-29 NOTE — Progress Notes (Signed)
ANTICOAGULATION CONSULT NOTE - Follow up Consult  Pharmacy Consult for bivalirudin Indication:  VA ECMO  Allergies  Allergen Reactions   Other Anaphylaxis    Mushrooms  Not listed on MAR    Plavix [Clopidogrel Bisulfate] Other (See Comments)    TTP Not listed on the [facility]   Fleet Enema [Enema] Other (See Comments)    Unknown reaction   Lovenox [Enoxaparin] Other (See Comments)    Unknown reaction   Morphine Other (See Comments)    Unknown reaction   Nsaids Other (See Comments)    Unknown reaction   Hydrocodone-Acetaminophen Itching    Patient Measurements: Height: 5\' 6"  (167.6 cm) Weight: 82.7 kg (182 lb 5.1 oz) IBW/kg (Calculated) : 63.8 Heparin Dosing Weight: 81.3 kg  Vital Signs: Temp: 97.7 F (36.5 C) (09/22 0730) Temp Source: Core (09/22 0400) Pulse Rate: 81 (09/22 0730)  Labs: Recent Labs    05/27/23 0432 05/27/23 0438 05/28/23 0411 05/28/23 0416 05/28/23 1558 05/28/23 1600 05/29/23 0054 05/29/23 0407  HGB  --    < > 8.9*   < > 8.7* 9.5* 8.5* 8.5*  HCT  --    < > 26.4*   < > 26.6* 28.0* 25.0* 26.1*  PLT  --    < > 19*  --  20*  --   --  20*  APTT 73*   < > 70*  --  70*  --   --  71*  LABPROT 19.3*  --  19.7*  --   --   --   --  18.9*  INR 1.6*  --  1.7*  --   --   --   --  1.6*  CREATININE  --    < > 1.13  --  1.14  --   --  1.11   < > = values in this interval not displayed.    Estimated Creatinine Clearance: 68.8 mL/min (by C-G formula based on SCr of 1.11 mg/dL).   Assessment: 62 yom underwent Bentall/AVR, TV repair, re-implantation of SVG-RCA complicated with vasoplegia and pulmonary edema requiring central VA ECMO.  No AC PTA.  aPTT therapeutic at upper end of range at 71 seconds. Pltc remains low at 20k, H/H stable. No bleeding concerns per RN.   Goal of Therapy:  aPTT 50-70 seconds Monitor platelets by anticoagulation protocol: Yes   Plan:  Reduce bivalirudin to 0.005 mg/kg/hr  Monitor q12 hr aPTT and CBC, and for s/sx of  bleeding.  Fredonia Highland, PharmD, BCPS, [facility] Clinical Pharmacist [phone] Please check AMION for all [facility] Pharmacy numbers 05/29/2023

## 2023-05-29 NOTE — Progress Notes (Signed)
ANTICOAGULATION CONSULT NOTE - Follow up Consult  Pharmacy Consult for bivalirudin Indication:  VA ECMO  Allergies  Allergen Reactions   Other Anaphylaxis    Mushrooms  Not listed on MAR    Plavix [Clopidogrel Bisulfate] Other (See Comments)    TTP Not listed on the [facility]   Fleet Enema [Enema] Other (See Comments)    Unknown reaction   Lovenox [Enoxaparin] Other (See Comments)    Unknown reaction   Morphine Other (See Comments)    Unknown reaction   Nsaids Other (See Comments)    Unknown reaction   Hydrocodone-Acetaminophen Itching    Patient Measurements: Height: 5\' 6"  (167.6 cm) Weight: 82.7 kg (182 lb 5.1 oz) IBW/kg (Calculated) : 63.8 Heparin Dosing Weight: 81.3 kg  Vital Signs: Temp: 97.5 F (36.4 C) (09/22 1645) Temp Source: Core (09/22 0800) Pulse Rate: 72 (09/22 1645)  Labs: Recent Labs    05/27/23 0432 05/27/23 0438 05/28/23 0411 05/28/23 0416 05/28/23 1558 05/28/23 1600 05/29/23 0407 05/29/23 0751 05/29/23 1212 05/29/23 1609 05/29/23 1612  HGB  --    < > 8.9*   < > 8.7*   < > 8.5*   < > 9.2* 7.8* 10.5*  HCT  --    < > 26.4*   < > 26.6*   < > 26.1*   < > 27.0* 24.4* 31.0*  PLT  --    < > 19*  --  20*  --  20*  --   --  17*  --   APTT 73*   < > 70*  --  70*  --  71*  --   --  65*  --   LABPROT 19.3*  --  19.7*  --   --   --  18.9*  --   --   --   --   INR 1.6*  --  1.7*  --   --   --  1.6*  --   --   --   --   CREATININE  --    < > 1.13  --  1.14  --  1.11  --   --  1.12  --    < > = values in this interval not displayed.    Estimated Creatinine Clearance: 68.2 mL/min (by C-G formula based on SCr of 1.12 mg/dL).   Assessment: 62 yom underwent Bentall/AVR, TV repair, re-implantation of SVG-RCA complicated with vasoplegia and pulmonary edema requiring central VA ECMO.  No AC PTA.  aPTT 65 (therapeutic) on bivalirudin 0.005 mg/kg/hr  Goal of Therapy:  aPTT 50-70 seconds Monitor platelets by anticoagulation protocol: Yes   Plan:  Continue  bivalirudin to 0.005 mg/kg/hr  Monitor q12 hr aPTT and CBC, and for s/sx of bleeding.  Eldridge Scot, PharmD, BCCCP Clinical Pharmacist 05/29/2023, 5:03 PM

## 2023-05-29 NOTE — Progress Notes (Signed)
3 Days Post-Op Procedure(s) (LRB): MEDIASTINAL WASHOUT (N/A) INTRAOPERATIVE TRANSESOPHAGEAL ECHOCARDIOGRAM (N/A) Subjective:  Hemodynamics have been about the same. Up and down on NE and ECMO flow to keep MAP>65.   Epi 10, NE 12-14, vaso 0.04  Objective: Vital signs in last 24 hours: Temp:  [97.5 F (36.4 C)-98.1 F (36.7 C)] 97.5 F (36.4 C) (09/22 0845) Pulse Rate:  [60-121] 78 (09/22 0845) Cardiac Rhythm: Junctional rhythm (09/22 0800) Resp:  [0-24] 19 (09/22 0845) BP: (96-101)/(65-66) 96/65 (09/21 1506) SpO2:  [99 %-100 %] 100 % (09/22 0845) Arterial Line BP: (60-112)/(-1-72) 106/67 (09/22 0845) FiO2 (%):  [50 %] 50 % (09/22 0400) Weight:  [82.7 kg] 82.7 kg (09/22 0500)  Hemodynamic parameters for last 24 hours: PAP: (26-37)/(14-28) 31/18 CVP:  [3 mmHg-17 mmHg] 7 mmHg PCWP:  [20 mmHg-22 mmHg] 20 mmHg CO:  [3.7 L/min-4.5 L/min] 3.8 L/min CI:  [1.9 L/min/m2-2.3 L/min/m2] 1.95 L/min/m2  Intake/Output from previous day: 09/21 0701 - 09/22 0700 In: 4143.3 [I.V.:2235.7; NG/GT:767.7; IV Piggyback:1139.9] Out: 5272 [Stool:900; Chest Tube:20] Intake/Output this shift: Total I/O In: 185.2 [I.V.:93.9; Other:60; NG/GT:20; IV Piggyback:11.3] Out: 160 [Chest Tube:10]  General appearance: intubated and sedated on ECMO Neurologic: unable to assess Heart: regular rate and rhythm Lungs: fairly clear on left, consolidated on right Abdomen: soft,distended about the same as yesterday; no bowel sounds Extremities: edema mild Wound: chest dressing intact. Leg incision ok  Lab Results: Recent Labs    05/28/23 1558 05/28/23 1600 05/29/23 0407 05/29/23 0751  WBC 48.5*  --  53.4*  --   HGB 8.7*   < > 8.5* 8.5*  HCT 26.6*   < > 26.1* 25.0*  PLT 20*  --  20*  --    < > = values in this interval not displayed.   BMET:  Recent Labs    05/28/23 1558 05/28/23 1600 05/29/23 0407 05/29/23 0751  NA 135   < > 134* 136  K 3.6   < > 3.7 3.9  CL 103  --  99  --   CO2 24  --  21*  --    GLUCOSE 152*  --  163*  --   BUN 20  --  29*  --   CREATININE 1.14  --  1.11  --   CALCIUM 8.4*  --  8.7*  --    < > = values in this interval not displayed.    PT/INR:  Recent Labs    05/29/23 0407  LABPROT 18.9*  INR 1.6*   ABG    Component Value Date/Time   PHART 7.321 (L) 05/29/2023 0751   HCO3 23.3 05/29/2023 0751   TCO2 25 05/29/2023 0751   ACIDBASEDEF 3.0 (H) 05/29/2023 0751   O2SAT 97 05/29/2023 0751   CBG (last 3)  Recent Labs    05/28/23 2003 05/29/23 0007 05/29/23 0410  GLUCAP 127* 139* 160*   CXR: I think the aeration on the right may be slightly better than yesterday. Left lung looks ok  Assessment/Plan:  POD 9 redo sternotomy, removal of dehisced aortic valve prosthesis, aortic root homograft, extension of vein graft to RCA, TV annuloplasty.  VA ECMO.   Remains vasoplegic requiring multiple vasopressors. Midodrine added.   Acute hypoxemic resp failure requiring VA ECMO. Right lung looked a little improved yesterday but worse today. Will continue support on ECMO for now. Prognosis for lung recovery declines as we get further out from surgery without improvement. Bronch cultures negative so far.   Continuing broad spectrum antibiotics for  pneumonia. Leukocytosis to 53.4. remains afebrile but on CRRT.   ESRD on CRRT to keep even to slightly positive.   Bival for anticoagulation.   Thrombocytopenia stable at 20 K. Will observe as long as there is no bleeding.   Severe malnutrition in context of chronic illness. Trickle tube feeds going but no BS and abdomen is distended. 1L stool out overnight.  I don't think we can advance tube feeds at this time.     LOS: 11 days    Alleen Borne 05/29/2023

## 2023-05-30 ENCOUNTER — Inpatient Hospital Stay (HOSPITAL_COMMUNITY): Payer: PPO

## 2023-05-30 DIAGNOSIS — I509 Heart failure, unspecified: Secondary | ICD-10-CM | POA: Diagnosis not present

## 2023-05-30 DIAGNOSIS — J9601 Acute respiratory failure with hypoxia: Secondary | ICD-10-CM | POA: Diagnosis not present

## 2023-05-30 DIAGNOSIS — T8111XA Postprocedural  cardiogenic shock, initial encounter: Secondary | ICD-10-CM | POA: Diagnosis not present

## 2023-05-30 LAB — TYPE AND SCREEN
ABO/RH(D): B POS
Antibody Screen: NEGATIVE
Unit division: 0
Unit division: 0
Unit division: 0
Unit division: 0
Unit division: 0
Unit division: 0

## 2023-05-30 LAB — POCT I-STAT 7, (LYTES, BLD GAS, ICA,H+H)
Acid-Base Excess: 0 mmol/L (ref 0.0–2.0)
Acid-base deficit: 1 mmol/L (ref 0.0–2.0)
Acid-base deficit: 1 mmol/L (ref 0.0–2.0)
Acid-base deficit: 1 mmol/L (ref 0.0–2.0)
Bicarbonate: 23.7 mmol/L (ref 20.0–28.0)
Bicarbonate: 24.8 mmol/L (ref 20.0–28.0)
Bicarbonate: 24.9 mmol/L (ref 20.0–28.0)
Bicarbonate: 24.9 mmol/L (ref 20.0–28.0)
Calcium, Ion: 1.16 mmol/L (ref 1.15–1.40)
Calcium, Ion: 1.16 mmol/L (ref 1.15–1.40)
Calcium, Ion: 1.16 mmol/L (ref 1.15–1.40)
Calcium, Ion: 1.19 mmol/L (ref 1.15–1.40)
HCT: 26 % — ABNORMAL LOW (ref 39.0–52.0)
HCT: 26 % — ABNORMAL LOW (ref 39.0–52.0)
HCT: 27 % — ABNORMAL LOW (ref 39.0–52.0)
HCT: 27 % — ABNORMAL LOW (ref 39.0–52.0)
Hemoglobin: 8.8 g/dL — ABNORMAL LOW (ref 13.0–17.0)
Hemoglobin: 8.8 g/dL — ABNORMAL LOW (ref 13.0–17.0)
Hemoglobin: 9.2 g/dL — ABNORMAL LOW (ref 13.0–17.0)
Hemoglobin: 9.2 g/dL — ABNORMAL LOW (ref 13.0–17.0)
O2 Saturation: 99 %
O2 Saturation: 99 %
O2 Saturation: 99 %
O2 Saturation: 99 %
Patient temperature: 36.3
Patient temperature: 36.4
Patient temperature: 36.5
Patient temperature: 36.5
Potassium: 3.8 mmol/L (ref 3.5–5.1)
Potassium: 3.8 mmol/L (ref 3.5–5.1)
Potassium: 4.1 mmol/L (ref 3.5–5.1)
Potassium: 4.2 mmol/L (ref 3.5–5.1)
Sodium: 136 mmol/L (ref 135–145)
Sodium: 136 mmol/L (ref 135–145)
Sodium: 136 mmol/L (ref 135–145)
Sodium: 137 mmol/L (ref 135–145)
TCO2: 25 mmol/L (ref 22–32)
TCO2: 26 mmol/L (ref 22–32)
TCO2: 26 mmol/L (ref 22–32)
TCO2: 26 mmol/L (ref 22–32)
pCO2 arterial: 39.7 mmHg (ref 32–48)
pCO2 arterial: 41.1 mmHg (ref 32–48)
pCO2 arterial: 44.1 mmHg (ref 32–48)
pCO2 arterial: 46.1 mmHg (ref 32–48)
pH, Arterial: 7.336 — ABNORMAL LOW (ref 7.35–7.45)
pH, Arterial: 7.358 (ref 7.35–7.45)
pH, Arterial: 7.382 (ref 7.35–7.45)
pH, Arterial: 7.387 (ref 7.35–7.45)
pO2, Arterial: 126 mmHg — ABNORMAL HIGH (ref 83–108)
pO2, Arterial: 129 mmHg — ABNORMAL HIGH (ref 83–108)
pO2, Arterial: 133 mmHg — ABNORMAL HIGH (ref 83–108)
pO2, Arterial: 155 mmHg — ABNORMAL HIGH (ref 83–108)

## 2023-05-30 LAB — BPAM RBC
Blood Product Expiration Date: 202410062359
Blood Product Expiration Date: 202410082359
Blood Product Expiration Date: 202410072359
Blood Product Expiration Date: 202410102359
Blood Product Expiration Date: 202410142359
Blood Product Expiration Date: 202410202359
ISSUE DATE / TIME: 202409221849
ISSUE DATE / TIME: 202409190607
ISSUE DATE / TIME: 202409190607
ISSUE DATE / TIME: 202409202329
Unit Type and Rh: 7300
Unit Type and Rh: 7300
Unit Type and Rh: 7300
Unit Type and Rh: 7300
Unit Type and Rh: 7300
Unit Type and Rh: 7300

## 2023-05-30 LAB — CG4 I-STAT (LACTIC ACID): Lactic Acid, Venous: 1.3 mmol/L (ref 0.5–1.9)

## 2023-05-30 LAB — COMPREHENSIVE METABOLIC PANEL
ALT: 9 U/L (ref 0–44)
AST: 76 U/L — ABNORMAL HIGH (ref 15–41)
Albumin: 1.8 g/dL — ABNORMAL LOW (ref 3.5–5.0)
Alkaline Phosphatase: 99 U/L (ref 38–126)
Anion gap: 10 (ref 5–15)
BUN: 22 mg/dL (ref 8–23)
CO2: 22 mmol/L (ref 22–32)
Calcium: 8.3 mg/dL — ABNORMAL LOW (ref 8.9–10.3)
Chloride: 102 mmol/L (ref 98–111)
Creatinine, Ser: 1.13 mg/dL (ref 0.61–1.24)
GFR, Estimated: >60 mL/min (ref >60)
Glucose, Bld: 118 mg/dL — ABNORMAL HIGH (ref 70–99)
Potassium: 3.7 mmol/L (ref 3.5–5.1)
Sodium: 134 mmol/L — ABNORMAL LOW (ref 135–145)
Total Bilirubin: 3 mg/dL — ABNORMAL HIGH (ref 0.3–1.2)
Total Protein: 6.1 g/dL — ABNORMAL LOW (ref 6.5–8.1)

## 2023-05-30 LAB — CBC
HCT: 27.2 % — ABNORMAL LOW (ref 39.0–52.0)
HCT: 26.2 % — ABNORMAL LOW (ref 39.0–52.0)
Hemoglobin: 8.9 g/dL — ABNORMAL LOW (ref 13.0–17.0)
Hemoglobin: 8.6 g/dL — ABNORMAL LOW (ref 13.0–17.0)
MCH: 30.3 pg (ref 26.0–34.0)
MCH: 30.7 pg (ref 26.0–34.0)
MCHC: 32.7 g/dL (ref 30.0–36.0)
MCHC: 32.8 g/dL (ref 30.0–36.0)
MCV: 92.5 fL (ref 80.0–100.0)
MCV: 93.6 fL (ref 80.0–100.0)
Platelets: 14 10*3/uL — CL (ref 150–400)
Platelets: 14 10*3/uL — CL (ref 150–400)
RBC: 2.94 MIL/uL — ABNORMAL LOW (ref 4.22–5.81)
RBC: 2.8 MIL/uL — ABNORMAL LOW (ref 4.22–5.81)
RDW: 18.6 % — ABNORMAL HIGH (ref 11.5–15.5)
RDW: 18.9 % — ABNORMAL HIGH (ref 11.5–15.5)
WBC: 45.6 10*3/uL — ABNORMAL HIGH (ref 4.0–10.5)
WBC: 44.9 10*3/uL — ABNORMAL HIGH (ref 4.0–10.5)
nRBC: 0.7 % — ABNORMAL HIGH (ref 0.0–0.2)
nRBC: 0.6 % — ABNORMAL HIGH (ref 0.0–0.2)

## 2023-05-30 LAB — RENAL FUNCTION PANEL
Albumin: 1.9 g/dL — ABNORMAL LOW (ref 3.5–5.0)
Anion gap: 9 (ref 5–15)
BUN: 16 mg/dL (ref 8–23)
CO2: 24 mmol/L (ref 22–32)
Calcium: 8 mg/dL — ABNORMAL LOW (ref 8.9–10.3)
Chloride: 102 mmol/L (ref 98–111)
Creatinine, Ser: 1.2 mg/dL (ref 0.61–1.24)
GFR, Estimated: >60 mL/min (ref >60)
Glucose, Bld: 141 mg/dL — ABNORMAL HIGH (ref 70–99)
Phosphorus: 4 mg/dL (ref 2.5–4.6)
Potassium: 3.9 mmol/L (ref 3.5–5.1)
Sodium: 135 mmol/L (ref 135–145)

## 2023-05-30 LAB — CULTURE, RESPIRATORY W GRAM STAIN

## 2023-05-30 LAB — FIBRINOGEN: Fibrinogen: 760 mg/dL — ABNORMAL HIGH (ref 210–475)

## 2023-05-30 LAB — MAGNESIUM: Magnesium: 2.4 mg/dL (ref 1.7–2.4)

## 2023-05-30 LAB — PHOSPHORUS: Phosphorus: 2 mg/dL — ABNORMAL LOW (ref 2.5–4.6)

## 2023-05-30 LAB — PROTIME-INR
INR: 1.5 — ABNORMAL HIGH (ref 0.8–1.2)
Prothrombin Time: 18.5 seconds — ABNORMAL HIGH (ref 11.4–15.2)

## 2023-05-30 LAB — LACTATE DEHYDROGENASE: LDH: 1116 U/L — ABNORMAL HIGH (ref 98–192)

## 2023-05-30 LAB — APTT
aPTT: 60 seconds — ABNORMAL HIGH (ref 24–36)
aPTT: 71 seconds — ABNORMAL HIGH (ref 24–36)

## 2023-05-30 LAB — PATHOLOGIST SMEAR REVIEW

## 2023-05-30 LAB — VANCOMYCIN, TROUGH: Vancomycin Tr: 17 ug/mL (ref 15–20)

## 2023-05-30 LAB — TRIGLYCERIDES: Triglycerides: 122 mg/dL (ref <150)

## 2023-05-30 MED ORDER — LACTULOSE 10 GM/15ML PO SOLN
30.0000 g | Freq: Three times a day (TID) | ORAL | Status: DC
  Administered 2023-05-30: 30 g
  Filled 2023-05-30: qty 45

## 2023-05-30 MED ORDER — SODIUM PHOSPHATES 45 MMOLE/15ML IV SOLN
30.0000 mmol | Freq: Once | INTRAVENOUS | Status: AC
  Administered 2023-05-30: 30 mmol via INTRAVENOUS
  Filled 2023-05-30: qty 10

## 2023-05-30 MED ORDER — SODIUM CHLORIDE 0.9% IV SOLUTION: Freq: Once | INTRAVENOUS | Status: DC

## 2023-05-30 MED ORDER — TRACE MINERALS CU-MN-SE-ZN 300-55-60-3000 MCG/ML IV SOLN
INTRAVENOUS | Status: AC
  Filled 2023-05-30: qty 467.2

## 2023-05-30 MED ORDER — NEOSTIGMINE METHYLSULFATE 10 MG/10ML IV SOLN
0.2500 mg | Freq: Four times a day (QID) | INTRAVENOUS | Status: AC
  Administered 2023-05-30 – 2023-05-31 (×4): 0.25 mg via SUBCUTANEOUS
  Filled 2023-05-30 (×4): qty 0.25

## 2023-05-30 MED ORDER — AMIODARONE IV BOLUS ONLY 150 MG/100ML
150.0000 mg | Freq: Once | INTRAVENOUS | Status: AC
  Administered 2023-05-30: 150 mg via INTRAVENOUS
  Filled 2023-05-30: qty 100

## 2023-05-30 MED ORDER — TRACE MINERALS CU-MN-SE-ZN 300-55-60-3000 MCG/ML IV SOLN
INTRAVENOUS | Status: DC
Start: 2023-05-30 — End: 2023-05-30

## 2023-05-30 MED ORDER — METHYLNALTREXONE BROMIDE 12 MG/0.6ML ~~LOC~~ SOLN
12.0000 mg | Freq: Once | SUBCUTANEOUS | Status: AC
  Administered 2023-05-30: 12 mg via SUBCUTANEOUS
  Filled 2023-05-30: qty 0.6

## 2023-05-30 NOTE — Progress Notes (Signed)
Nutrition Follow-up  DOCUMENTATION CODES:   Severe malnutrition in context of chronic illness  INTERVENTION:   TPN to meet 100% nutritional needs -Add Thiamine 100 mg IV daily x 5 days  Recommend attempting to advance Cortrak tube to post pyloric position when able  NUTRITION DIAGNOSIS:   Severe Malnutrition related to chronic illness as evidenced by severe muscle depletion, moderate fat depletion, edema, severe fat depletion, moderate muscle depletion.  Continues, being addressed via nutrition support  GOAL:   Patient will meet greater than or equal to 90% of their needs  Not met but addressing via initiation of TPN  MONITOR:   Vent status, Labs, Weight trends, TF tolerance, Skin, I & O's  REASON FOR ASSESSMENT:   Consult New TPN/TNA  ASSESSMENT:   62 year old male with PMHx of CAD s/p CABG 2017 with AVR, HLD, HTN, prior CVA, T2DM, CKD 4 was previously on HD. Recent admission on 5/25 with AKI and AMS, had PEA arrest with ROSC in 8 minutes, required dialysis, also with MSSA bacteremia associated with tricuspid valve endocarditis, suffered right cerebellar CVA and had cholangitis/pancreatitis requiring ERCP. 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  9/10: Admitted 9/11 Echo showing aortic valve dehiscence, confirmed by TEE; CRRT started for volume overload and known CKD 4 9/13 OR for redo of aortic valve, tricuspid valve repair and ascending aortic root replacement with re-implantation of SVG to RCA; could not come off CPB, cannulated and started on VA ECMO with central cannulation 9/16 TEE with EF 30%, moderate LVH, moderate RV dysfunction with mild enlargement, no endocarditis 9/18 Bronch this AM, TF held due to bilious output from mouth reported 9/19 Mediastinal washout, OG decompression, TF remain on hold 9/20 Bronch: copious secretions in R (pic in chart), mild irritation; abdomen soft, +stool, Cortrak placed, trickle  initiated 9/20 Pivot 1.5 at 20 restarted 9/21 Chest xray worse-dense consolidation RL, Bronch performed with ongoing heavy mucopurulent secretions on right,  Remains on trickles, no BM 9/22 TF at trickles with 1L stool but later held and OG to suction with 2L removed 9/23 TPN initiated  Pt remains on vent support, VA ECMO, CRRT-anuric. Chest remains open. Noted possible plan for conversion to VV ECMO tomorrow Levophed 7 Vasopressin 0.04 Epinephrine 10  OG to suction yesterday with return of 2L TF mixed with bile, 1.050 mL out thus far today. Pt has not been tolerating trickle TF OG and Cortrak both gastric Minimal nutrition since admission, plan for TPN today  +small amounts of stool via FMS. 235 mL in 24 hours, 335 mL today  Noted lactulose stated today, remains on reglan 4mg  IV q 6 hours. Pt also receicing scheduled colace and dulcolax. Relistor x 1  Weight down to 82.8 kg  Labs: sodium 134 (L), phosphorus 2.0 (L), corrected calcium 10.1 (wdl with albumin 1.8), ionized calcium 1.16 (wdl), magnesium 2.4 (wdl), potassium 3.8 (wdl), TG 122 (wdl), WBC 45.6 (H), platelets 14 (L), lactic acid 1.3 (wdl) Meds: sodium phosphate, insulin    Diet Order:   Diet Order             Diet NPO time specified  Diet effective midnight                   EDUCATION NEEDS:   No education needs have been identified at this time  Skin:  Skin Assessment: Skin Integrity Issues: Skin Integrity Issues:: Incisions, Other (Comment) Incisions: closed incision left leg Other: open chest  Last BM:  05/19/23 - small type 6  Height:   Ht Readings from Last 1 Encounters:  05/20/23 5\' 6"  (1.676 m)    Weight:   Wt Readings from Last 1 Encounters:  05/30/23 82.8 kg    Ideal Body Weight:  64.5 kg  BMI:  Body mass index is 29.46 kg/m.  Estimated Nutritional Needs:   Kcal:  2200-2400  Protein:  130-150 grams  Fluid:  1L plus UOP  Romelle Starcher MS, RDN, LDN, CNSC Registered Dietitian  3 Clinical Nutrition RD Pager and On-Call Pager Number Located in [place]

## 2023-05-30 NOTE — Progress Notes (Signed)
ANTICOAGULATION CONSULT NOTE - Follow up Consult  Pharmacy Consult for bivalirudin Indication:  VA ECMO  Allergies  Allergen Reactions   Other Anaphylaxis    Mushrooms  Not listed on MAR    Plavix [Clopidogrel Bisulfate] Other (See Comments)    TTP Not listed on the [facility]   Fleet Enema [Enema] Other (See Comments)    Unknown reaction   Lovenox [Enoxaparin] Other (See Comments)    Unknown reaction   Morphine Other (See Comments)    Unknown reaction   Nsaids Other (See Comments)    Unknown reaction   Hydrocodone-Acetaminophen Itching    Patient Measurements: Height: 5\' 6"  (167.6 cm) Weight: 82.8 kg (182 lb 8.7 oz) IBW/kg (Calculated) : 63.8 Heparin Dosing Weight: 81.3 kg  Vital Signs: Temp: 97.5 F (36.4 C) (09/23 0730) Pulse Rate: 74 (09/23 0730)  Labs: Recent Labs    05/28/23 0411 05/28/23 0416 05/29/23 0407 05/29/23 0751 05/29/23 1609 05/29/23 1612 05/29/23 1722 05/30/23 0002 05/30/23 0418 05/30/23 0613  HGB 8.9*   < > 8.5*   < > 7.8*   < > 7.9* 9.2* 8.9* 8.8*  HCT 26.4*   < > 26.1*   < > 24.4*   < > 24.5* 27.0* 27.2* 26.0*  PLT 19*   < > 20*  --  17*  --  17*  --  14*  --   APTT 70*   < > 71*  --  65*  --   --   --  60*  --   LABPROT 19.7*  --  18.9*  --   --   --   --   --  18.5*  --   INR 1.7*  --  1.6*  --   --   --   --   --  1.5*  --   CREATININE 1.13   < > 1.11  --  1.12  --   --   --  1.13  --    < > = values in this interval not displayed.    Estimated Creatinine Clearance: 67.6 mL/min (by C-G formula based on SCr of 1.13 mg/dL).   Assessment: 62 yom underwent Bentall/AVR, TV repair, re-implantation of SVG-RCA complicated with vasoplegia and pulmonary edema requiring central VA ECMO.  No AC PTA.  aPTT 60 sec (therapeutic) on bivalirudin 0.005 mg/kg/hr. Plt remain low (15-20).  Goal of Therapy:  aPTT 50-70 seconds Monitor platelets by anticoagulation protocol: Yes   Plan:  Continue bivalirudin to 0.005 mg/kg/hr  Monitor q12 hr aPTT and  CBC, and for s/sx of bleeding.  Wilmer Floor, PharmD PGY2 Cardiology Pharmacy Resident 05/30/2023, 8:10 AM

## 2023-05-30 NOTE — Plan of Care (Signed)
  Problem: Safety: Goal: Ability to remain free from injury will improve Outcome: Progressing   Problem: Skin Integrity: Goal: Risk for impaired skin integrity will decrease Outcome: Progressing   Problem: Cardiac: Goal: Will achieve and/or maintain hemodynamic stability Outcome: Progressing

## 2023-05-30 NOTE — Progress Notes (Signed)
Patient ID: [name], male   DOB: 21-Dec-1960, 62 y.o.   MRN: [id number]   Extracorporeal support note   ECLS support day: Indication: Post-op vasoplegia and pulmonary edema  Configuration: Central VA ECMO  Drainage cannula: RA Return cannula: Ascending aorta  Pump speed: 2900 rpm  Pump flow: 2.9 L/min Pump used: Cardiohelp  Sweep gas: 4 with blender at 85%to lower PaO2  Circuit check: Small clot at 12p Anticoagulant: Bivalirudin Anticoagulation targets: PTT 50-70  Changes in support: none  Anticipated goals/duration of support: Wean to VA decannulation and transition to VV ECMO.   Arvilla Meres MD  05/30/2023, 8:23 PM

## 2023-05-30 NOTE — Progress Notes (Signed)
Patient ID: [name], male   DOB: 03-23-1961, 62 y.o.   MRN: [id number] I discussed the operative procedure with pt's son in the waiting room and his dad on the phone. I reviewed benefits and risks including but not limited to bleeding, blood transfusion, infection, blood vessel injury, heart failure, circulatory shock and death. They understand and agree to proceed tomorrow.

## 2023-05-30 NOTE — Plan of Care (Signed)
  Problem: Skin Integrity: Goal: Risk for impaired skin integrity will be minimized. Outcome: Progressing   Problem: Fluid Volume: Goal: Ability to maintain a balanced intake and output will improve Outcome: Progressing   Problem: Metabolic: Goal: Ability to maintain appropriate glucose levels will improve Outcome: Progressing   Problem: Skin Integrity: Goal: Risk for impaired skin integrity will decrease Outcome: Progressing

## 2023-05-30 NOTE — Progress Notes (Addendum)
PHARMACY - TOTAL PARENTERAL NUTRITION CONSULT NOTE   Indication:  unable to tolerate tube feeding  Patient Measurements: Height: 5\' 6"  (167.6 cm) Weight: 82.8 kg (182 lb 8.7 oz) IBW/kg (Calculated) : 63.8 TPN AdjBW (KG): 69 Body mass index is 29.46 kg/m. Usual Weight: ~90kg  Assessment: 62 yo male with significant PMH for HLD, HTN, T2DM, CKD4 (prev on HD) and recent MSSA bacteremia + endocarditis (May 2024). Back in May 2024 admission, underwent ERCP for cholangitis/pancreatitis. Admitted on 9/11 w/ chest pain found to have aortic valve dehiscence. S/p aortic valve replacement redo on 9/13 - developed pulmonary edema post op and was placed on VA ECMO. S/p mediastinal washout on 9/19 - chest still open. Unable to speak w/ patient due to intubation. Pharmacy consulted to manage TPN for inability to tolerate tube feeding and malnutrition.   Glucose / Insulin: hx T2DM - A1c 6.1%, on Farxiga PTA. Previously on insulin gtt post CT surgery >> transitioned to mSSI q4h. 11 units SSI used/24 hours. CBGs 118-175 Electrolytes: K 3.8 (s/p Kphos x1), Na 134, Phos 2, Mg 2.4, CoCa 10.1 Renal: Hx CKD4 on CRRT (required HD in the past) 9/11 >>  Hepatic: Alk phos 99, AST 76/ALT 9, Tbili 3, albumin 1.8, TG 122 Intake / Output: 2L OG output, Trickle TF @ 20 mL/hr stopped 9/22 PM due to inc OG output and no bowel sounds. Relistor x1 given 9/23, receiving Reglan 5mg  IV q6h - Chest tube output 50mL, stool output 335 mL - NGT placed 9/22, Cortrak placed 9/20  MIVF: LR and NS @ KVO  GI Imaging:  - None since TPN start  GI Surgeries / Procedures:  - None since TPN start - planning transition to VV ECMO on 9/24  Central access: 9/13 TPN start date: 9/23  Nutritional Goals: Goal TPN rate is 75 mL/hr (provides 131 g of protein and 2250 kcals per day)  RD Assessment: Estimated Needs Total Energy Estimated Needs: 2200-2400 Total Protein Estimated Needs: 130-150 grams Total Fluid Estimated Needs: 1L plus  UOP  Current Nutrition:  NPO and TPN to start 9/23  Plan:  Start TPN at 46mL/hr at 1800 - will provide 1199 kcal and 70g protein meeting ~50% of needs Electrolytes in TPN: Na 52mEq/L, K 27mEq/L, Ca 97mEq/L, Mg 38mEq/L, and Phos to 56mmol/L. Cl:Ac 1:1 D/c per tube MVI and begin standard MVI and trace elements within TPN No chromium (on CRRT and currently on shortage) Add thiamine to TPN x 5 days for refeeding risk (9/23 >> 9/27) Continue Moderate q4h SSI and adjust as needed  Continue M[facility] at [facility] Monitor TPN labs on Mon/Thurs, RFP BID while on CRRT  Give NaPhos IV x1 per nephrology  Rexford Maus, PharmD, BCPS 05/30/2023 9:08 AM

## 2023-05-30 NOTE — Progress Notes (Signed)
4 Days Post-Op Procedure(s) (LRB): MEDIASTINAL WASHOUT (N/A) INTRAOPERATIVE TRANSESOPHAGEAL ECHOCARDIOGRAM (N/A) Subjective:  He has been hemodynamically stable overnight. Epi 10, NE 2, vaso 0.04  CVP 5, PA 30/17, CI 2.7 but not accurate with right atrial cannula on ECMO.  -1900 cc on CRRT  Objective: Vital signs in last 24 hours: Temp:  [96.8 F (36 C)-97.7 F (36.5 C)] 97.5 F (36.4 C) (09/23 0645) Pulse Rate:  [72-86] 74 (09/23 0645) Cardiac Rhythm: Junctional rhythm (09/22 2000) Resp:  [0-25] 24 (09/23 0645) SpO2:  [98 %-100 %] 100 % (09/23 0645) Arterial Line BP: (78-131)/(47-77) 100/61 (09/23 0645) FiO2 (%):  [50 %] 50 % (09/23 0341) Weight:  [82.8 kg] 82.8 kg (09/23 0500)  Hemodynamic parameters for last 24 hours: PAP: (26-39)/(15-22) 30/17 CVP:  [3 mmHg-10 mmHg] 5 mmHg CO:  [3.8 L/min-5.5 L/min] 5.3 L/min CI:  [1.95 L/min/m2-2.8 L/min/m2] 2.7 L/min/m2  Intake/Output from previous day: 09/22 0701 - 09/23 0700 In: 3836.1 [I.V.:2123.3; Blood:315; NG/GT:390; IV Piggyback:992.8] Out: 5738 [Emesis/NG output:2250; Stool:235; Chest Tube:50] Intake/Output this shift: No intake/output data recorded.  General appearance: intubated and sedated on ECMO Neurologic: unable to assess Heart: regular rate and rhythm, S1, S2 normal, no murmur Lungs: fairly clear on left, still sounds consolidated on right Abdomen: soft, no bowel sounds, no distension since OG to suction.  Extremities: no edema Wound: chest dressing intact  Lab Results: Recent Labs    05/29/23 1722 05/30/23 0002 05/30/23 0418 05/30/23 0613  WBC 49.2*  --  45.6*  --   HGB 7.9*   < > 8.9* 8.8*  HCT 24.5*   < > 27.2* 26.0*  PLT 17*  --  14*  --    < > = values in this interval not displayed.   BMET:  Recent Labs    05/29/23 1609 05/29/23 1612 05/30/23 0418 05/30/23 0613  NA 135   < > 134* 137  K 3.9   < > 3.7 3.8  CL 104  --  102  --   CO2 24  --  22  --   GLUCOSE 135*  --  118*  --   BUN 30*   --  22  --   CREATININE 1.12  --  1.13  --   CALCIUM 7.9*  --  8.3*  --    < > = values in this interval not displayed.    PT/INR:  Recent Labs    05/30/23 0418  LABPROT 18.5*  INR 1.5*   ABG    Component Value Date/Time   PHART 7.387 05/30/2023 0613   HCO3 24.9 05/30/2023 0613   TCO2 26 05/30/2023 0613   ACIDBASEDEF 1.0 05/30/2023 0002   O2SAT 99 05/30/2023 0613   CBG (last 3)  Recent Labs    05/29/23 0748 05/29/23 1211 05/29/23 1611  GLUCAP 173* 175* 147*   CXR: left lung looks ok, right lung aeration looks like it is slowly improving to me.  Assessment/Plan:  POD 10 redo sternotomy, removal of dehisced aortic valve prosthesis, aortic root homograft, extension of vein graft to RCA, TV annuloplasty.  VA ECMO.   Remains vasoplegic requiring multiple vasopressors but improved and NE weaned down to 2. Midodrine added.   Acute hypoxemic resp failure requiring VA ECMO. Right lung looks gradually a little better to me.   Continuing broad spectrum antibiotics for pneumonia. Leukocytosis down slightly 45.6. remains afebrile but on CRRT. Only thing that has cultured from BAL is fungus that may be contaminant. He is on Micafungin.  ESRD on CRRT to keep even to slightly positive. -1900 cc yesterday due to OG output of 2250.   Bival for anticoagulation.   Thrombocytopenia to 17K. Will observe as long as there is no bleeding.   Severe malnutrition in context of chronic illness. Trickle tube feeds stopped due to abdominal distension and no BS. 2L suctioned from OG. Will start TNA today.  LOS: 12 days    Alleen Borne 05/30/2023

## 2023-05-30 NOTE — Progress Notes (Signed)
Pharmacy Antibiotic Note  Tavian Ferretiz is a 62 y.o. male admitted on 05/17/2023 with bioprosthetic aortic valve dehiscence > s/p aortic root / valve repair possible endocarditis - s/p MSSA TV endocarditis 01/2023. Initially afebrile, wbc 12 esrd >CRRT / ECMO post op. Current antibiotics: vancomycin + meropenem.  Vancomycin trough of 17 obtained 9/16 on CRRT, therapeutic. WBC remains elevated at 48.0, will add antifungal coverage and add micafungin.  VT 17 mcg/mL on 9/23 @ 2212 (on CRRT) - drawn appropriately -- therapeutic   Plan: Continue vancomycin 1000 mg IV  q 24 on CRRT F/u RRT plans, levels PRN per protocol   Height: 5\' 6"  (167.6 cm) Weight: 82.8 kg (182 lb 8.7 oz) IBW/kg (Calculated) : 63.8  Temp (24hrs), Avg:97.6 F (36.4 C), Min:97.3 F (36.3 C), Max:97.9 F (36.6 C)  Recent Labs  Lab 05/26/23 0404 05/26/23 1151 05/27/23 0307 05/27/23 1611 05/28/23 0411 05/28/23 1558 05/29/23 0407 05/29/23 1609 05/29/23 1722 05/30/23 0418 05/30/23 0752 05/30/23 1608 05/30/23 2212  WBC 48.7*   < > 48.0*   < > 42.0* 48.5* 53.4* 49.9* 49.2* 45.6*  --  44.9*  --   CREATININE 1.14   < > 1.20   < > 1.13 1.14 1.11 1.12  --  1.13  --  1.20  --   LATICACIDVEN 1.6  --  1.6  --  1.8  --  2.1*  --   --   --  1.3  --   --   VANCOTROUGH  --   --   --   --   --   --   --   --   --   --   --   --  17   < > = values in this interval not displayed.    Estimated Creatinine Clearance: 63.6 mL/min (by C-G formula based on SCr of 1.2 mg/dL).    Allergies  Allergen Reactions   Other Anaphylaxis    Mushrooms  Not listed on MAR    Plavix [Clopidogrel Bisulfate] Other (See Comments)    TTP Not listed on the [facility]   Fleet Enema [Enema] Other (See Comments)    Unknown reaction   Lovenox [Enoxaparin] Other (See Comments)    Unknown reaction   Morphine Other (See Comments)    Unknown reaction   Nsaids Other (See Comments)    Unknown reaction   Hydrocodone-Acetaminophen Itching    Antimicrobials  this admission: Ceftriaxone 9/11>> 9/15 Meropenem 9/15 > Vanc 9/11> Micafungin 9/20 >  Dose adjustments this admission:  Microbiology results: 9/20 bronch sputum cx: pending 9/13 Aortic valve: ngtd 9/12 MRSA PCR: neg 9/11 BCx: ngtd 9/11 MRSA pcr neg   Calton Dach, PharmD, BCCCP Clinical Pharmacist 05/30/2023 11:15 PM

## 2023-05-30 NOTE — Progress Notes (Signed)
ANTICOAGULATION CONSULT NOTE - Follow up Consult  Pharmacy Consult for bivalirudin Indication:  VA ECMO  Allergies  Allergen Reactions   Other Anaphylaxis    Mushrooms  Not listed on MAR    Plavix [Clopidogrel Bisulfate] Other (See Comments)    TTP Not listed on the [facility]   Fleet Enema [Enema] Other (See Comments)    Unknown reaction   Lovenox [Enoxaparin] Other (See Comments)    Unknown reaction   Morphine Other (See Comments)    Unknown reaction   Nsaids Other (See Comments)    Unknown reaction   Hydrocodone-Acetaminophen Itching    Patient Measurements: Height: 5\' 6"  (167.6 cm) Weight: 82.8 kg (182 lb 8.7 oz) IBW/kg (Calculated) : 63.8 Heparin Dosing Weight: 81.3 kg  Vital Signs: Temp: 97.7 F (36.5 C) (09/23 1900) Temp Source: Core (09/23 1200) BP: 105/62 (09/23 0810) Pulse Rate: 72 (09/23 1900)  Labs: Recent Labs    05/28/23 0411 05/28/23 0416 05/29/23 0407 05/29/23 0751 05/29/23 1609 05/29/23 1612 05/29/23 1722 05/30/23 0002 05/30/23 0418 05/30/23 0613 05/30/23 1208 05/30/23 1608  HGB 8.9*   < > 8.5*   < > 7.8*   < > 7.9*   < > 8.9* 8.8* 9.2* 8.6*  HCT 26.4*   < > 26.1*   < > 24.4*   < > 24.5*   < > 27.2* 26.0* 27.0* 26.2*  PLT 19*   < > 20*  --  17*  --  17*  --  14*  --   --  14*  APTT 70*   < > 71*  --  65*  --   --   --  60*  --   --  71*  LABPROT 19.7*  --  18.9*  --   --   --   --   --  18.5*  --   --   --   INR 1.7*  --  1.6*  --   --   --   --   --  1.5*  --   --   --   CREATININE 1.13   < > 1.11  --  1.12  --   --   --  1.13  --   --  1.20   < > = values in this interval not displayed.    Estimated Creatinine Clearance: 63.6 mL/min (by C-G formula based on SCr of 1.2 mg/dL).   Assessment: 62 yom underwent Bent[place]/AVR, TV repair, re-implantation of SVG-RCA complicated with vasoplegia and pulmonary edema requiring central VA ECMO.  No AC PTA.  aPTT 71 sec (slightly supratherapeutic) on bivalirudin 0.005 mg/kg/hr. Plt remain low  (15-20).  Goal of Therapy:  aPTT 50-70 seconds Monitor platelets by anticoagulation protocol: Yes   Plan:  Continue bivalirudin to 0.005 mg/kg/hr  Monitor q12 hr aPTT and CBC, and for s/sx of bleeding.  Harland German, PharmD Clinical Pharmacist **Pharmacist phone directory can now be found on [email or web address] ([email or web address]).  Listed under [facility].

## 2023-05-30 NOTE — Progress Notes (Signed)
Patient ID: [name], male   DOB: 02-Mar-1961, 62 y.o.   MRN: [id number] S: Intubated and sedated. Pt seen and examined while on CRRT.  Labs and orders reviewed and changes made accordingly.  O:BP 96/65   Pulse 74   Temp (!) 97.5 F (36.4 C)   Resp (!) 24   Ht 5\' 6"  (1.676 m)   Wt 82.8 kg   SpO2 100%   BMI 29.46 kg/m   Intake/Output Summary (Last 24 hours) at 05/30/2023 0847 Last data filed at 05/30/2023 0800 Gross per 24 hour  Intake 3857.26 ml  Output 5758 ml  Net -1900.74 ml   Intake/Output: I/O last 3 completed shifts: In: 6190.1 [I.V.:3264.4; Blood:315; Other:15; NG/GT:750; IV Piggyback:1845.8] Out: 8947 [Emesis/NG output:2250; Stool:1035; Chest Tube:70]  Intake/Output this shift:  Total I/O In: 146.4 [I.V.:86.4; IV Piggyback:60] Out: 180  Weight change: 0.1 kg WNU:[id number] and sedated CVS: RRR at 74  Resp:ventilated BS bilaterally Abd: hypoactive bowel sounds, soft, NT/ND Ext: trace pretibal edema bilaterally  Recent Labs  Lab 05/24/23 0411 05/24/23 0413 05/25/23 0412 05/25/23 0421 05/14/2023 0404 05/16/2023 0411 05/27/23 0307 05/27/23 0315 05/27/23 1611 05/27/23 1619 05/28/23 0411 05/28/23 0416 05/28/23 1558 05/28/23 1600 05/29/23 0407 05/29/23 0751 05/29/23 1212 05/29/23 1609 05/29/23 1612 05/30/23 0002 05/30/23 0418 05/30/23 0613  NA 134*   < > 138   < > 135   < > 136   < > 137   < > 136   < > 135   < > 134* 136 136 135 135 136 134* 137  K 3.8   < > 3.4*   < > 4.0   < > 4.3   < > 4.0   < > 4.1   < > 3.6   < > 3.7 3.9 4.1 3.9 4.0 3.8 3.7 3.8  CL 103   < > 105   < > 104   < > 99  --  103  --  103  --  103  --  99  --   --  104  --   --  102  --   CO2 22   < > 23   < > 22   < > 23  --  23  --  23  --  24  --  21*  --   --  24  --   --  22  --   GLUCOSE 115*   < > 225*   < > 118*   < > 107*  --  114*  --  142*  --  152*  --  163*  --   --  135*  --   --  118*  --   BUN 11   < > 24*   < > 21   < > 17  --  14  --  16  --  20  --  29*  --   --  30*  --    --  22  --   CREATININE 1.24   < > 1.19   < > 1.14   < > 1.20  --  1.36*  --  1.13  --  1.14  --  1.11  --   --  1.12  --   --  1.13  --   ALBUMIN 2.4*  2.4*   < > 2.2*  2.2*   < > 2.3*  2.3*   < > 2.2*  2.2*  --  2.0*  --  2.1*  --  2.0*  --  1.9*  2.0*  --   --  1.9*  --   --  1.8*  --   CALCIUM 8.1*   < > 8.1*   < > 8.2*   < > 8.2*  --  8.1*  --  8.4*  --  8.4*  --  8.7*  --   --  7.9*  --   --  8.3*  --   PHOS 1.7*   < > <1.0*   < > 1.7*   < > 2.2*  --  2.5  --  1.8*  --  1.4*  --  2.1*  --   --  2.2*  --   --  2.0*  --   AST 68*  --  48*  --  64*  --  68*  --   --   --  60*  --   --   --  71*  --   --   --   --   --  76*  --   ALT 11  --  9  --  12  --  11  --   --   --  9  --   --   --  8  --   --   --   --   --  9  --    < > = values in this interval not displayed.   Liver Function Tests: Recent Labs  Lab 05/28/23 0411 05/28/23 1558 05/29/23 0407 05/29/23 1609 05/30/23 0418  AST 60*  --  71*  --  76*  ALT 9  --  8  --  9  ALKPHOS 85  --  100  --  99  BILITOT 2.1*  --  2.3*  --  3.0*  PROT 5.9*  --  6.3*  --  6.1*  ALBUMIN 2.1*   < > 1.9*  2.0* 1.9* 1.8*   < > = values in this interval not displayed.   No results for input(s): "LIPASE", "AMYLASE" in the last 168 hours. No results for input(s): "AMMONIA" in the last 168 hours. CBC: Recent Labs  Lab 05/28/23 1558 05/28/23 1600 05/29/23 0407 05/29/23 0751 05/29/23 1609 05/29/23 1612 05/29/23 1722 05/30/23 0002 05/30/23 0418 05/30/23 0613  WBC 48.5*  --  53.4*  --  49.9*  --  49.2*  --  45.6*  --   NEUTROABS  --   --   --   --   --   --  25.7*  --   --   --   HGB 8.7*   < > 8.5*   < > 7.8*   < > 7.9* 9.2* 8.9* 8.8*  HCT 26.6*   < > 26.1*   < > 24.4*   < > 24.5* 27.0* 27.2* 26.0*  MCV 91.4  --  92.6  --  91.0  --  90.7  --  92.5  --   PLT 20*  --  20*  --  17*  --  17*  --  14*  --    < > = values in this interval not displayed.   Cardiac Enzymes: No results for input(s): "CKTOTAL", "CKMB", "CKMBINDEX",  "TROPONINI" in the last 168 hours. CBG: Recent Labs  Lab 05/29/23 0007 05/29/23 0410 05/29/23 0748 05/29/23 1211 05/29/23 1611  GLUCAP 139* 160* 173* 175* 147*    Iron Studies: No results for input(s): "IRON", "TIBC", "TRANSFERRIN", "FERRITIN" in the last 72  hours. Studies/Results: DG CHEST PORT 1 VIEW  Result Date: 05/30/2023 CLINICAL DATA:  ECMO EXAM: PORTABLE CHEST 1 VIEW COMPARISON:  Some view chest x-ray 05/29/2023 FINDINGS: The patient is intubated. Mediastinal drain and bilateral chest tubes are in place. ECMO catheters stable. Dense consolidation laterally in the right lung is again noted. Edema on the left is stable. IMPRESSION: 1. Stable support apparatus. 2. Stable dense consolidation laterally in the right lung. Electronically Signed   By: Marin Roberts M.D.   On: 05/30/2023 07:44   DG Abd 1 View  Result Date: 05/29/2023 CLINICAL DATA:  [id number] Encounter for orogastric (OG) tube placement [id number] EXAM: ABDOMEN - 1 VIEW COMPARISON:  05/27/2023 FINDINGS: Limited radiograph of the lower chest and upper abdomen was obtained for the purposes of enteric tube localization. Small bore enteric tube is seen coursing below the diaphragm with distal tip terminating within the expected location of the proximal gastric body. Additional large bore feeding tube terminates within the distal stomach. Partially imaged ECMO cannulas, left basilar chest tube. Right basilar consolidation. IMPRESSION: 1. Small bore enteric tube terminates within the expected location of the proximal gastric body. 2. Additional large bore feeding tube terminates within the distal stomach. Electronically Signed   By: Duanne Guess D.O.   On: 05/29/2023 15:37   DG CHEST PORT 1 VIEW  Result Date: 05/29/2023 CLINICAL DATA:  [id number] Personal history of ECMO [id number] EXAM: PORTABLE CHEST 1 VIEW COMPARISON:  September 19th -21 2024 FINDINGS: The cardiomediastinal silhouette is unchanged in contour.ECMO cannulation support  apparatus. Overlying tubing and hemostats. Carina is challenging to visualize but the ETT tip is estimated to terminate 19 mm above the carina. LEFT IJ PA catheter tip terminates over the main pulmonary artery. Bilateral chest tubes. Midline drain. RIGHT chest CVC tip terminates over the superior cavoatrial junction. The enteric tube courses through the chest to the abdomen beyond the field-of-view. Temporary cardiac pacing wires. No pneumothorax. Similar appearance of near complete opacification of the RIGHT lung, not significantly changed. LEFT basilar opacities, not significantly changed. IMPRESSION: 1. Similar appearance of near complete opacification of the RIGHT lung. 2. Support apparatus as described above. Electronically Signed   By: Meda Klinefelter M.D.   On: 05/29/2023 08:59    sodium chloride   Intravenous Once   sodium chloride   Intravenous Once   sodium chloride   Intravenous Once   atorvastatin  80 mg Per Tube q1800   bisacodyl  10 mg Oral Daily   Or   bisacodyl  10 mg Rectal Daily   Chlorhexidine Gluconate Cloth  6 each Topical Daily   docusate  100 mg Per Tube BID   ezetimibe  10 mg Per Tube Daily   insulin aspart  0-15 Units Subcutaneous Q4H   lactulose  30 g Per Tube TID   metoCLOPramide (REGLAN) injection  5 mg Intravenous Q6H   midodrine  10 mg Per Tube Q8H   multivitamin  1 tablet Per Tube BID   mouth rinse  15 mL Mouth Rinse Q2H   pantoprazole (PROTONIX) IV  40 mg Intravenous QHS   sodium chloride flush  3 mL Intravenous Q12H    BMET    Component Value Date/Time   NA 137 05/30/2023 0613   NA 143 11/02/2022 1454   K 3.8 05/30/2023 0613   CL 102 05/30/2023 0418   CO2 22 05/30/2023 0418   GLUCOSE 118 (H) 05/30/2023 0418   BUN 22 05/30/2023 0418   BUN 31 (H) 11/02/2022 1454  CREATININE 1.13 05/30/2023 0418   CREATININE 2.71 (H) 06/03/2023 0950   CREATININE 1.05 02/24/2015 1601   CALCIUM 8.3 (L) 05/30/2023 0418   GFRNONAA >60 05/30/2023 0418   GFRNONAA 26  (L) 05/23/2023 0950   GFRNONAA 80 02/24/2015 1601   GFRAA 62 10/09/2020 1520   GFRAA >89 02/24/2015 1601   CBC    Component Value Date/Time   WBC 45.6 (H) 05/30/2023 0418   RBC 2.94 (L) 05/30/2023 0418   HGB 8.8 (L) 05/30/2023 0613   HGB 6.4 (LL) 06/04/2023 0852   HGB 10.2 (L) 08/12/2022 0934   HCT 26.0 (L) 05/30/2023 0613   HCT 29.2 (L) 08/12/2022 0934   PLT 14 (LL) 05/30/2023 0418   PLT 109 (L) 05/13/2023 0852   PLT 252 08/12/2022 0934   MCV 92.5 05/30/2023 0418   MCV 94 08/12/2022 0934   MCH 30.3 05/30/2023 0418   MCHC 32.7 05/30/2023 0418   RDW 18.6 (H) 05/30/2023 0418   RDW 15.5 (H) 08/12/2022 0934   LYMPHSABS 5.8 (H) 05/29/2023 1722   LYMPHSABS 1.9 11/04/2021 1028   MONOABS 6.3 (H) 05/29/2023 1722   EOSABS 0.0 05/29/2023 1722   EOSABS 0.2 11/04/2021 1028   BASOSABS 0.0 05/29/2023 1722   BASOSABS 0.1 11/04/2021 1028     Assessment/Plan:  Anuric AKI/dialysis dependent AKI with fluid overload: Started on RRT during previous admission in May 2024 until 05/10/23 (recovered). Patient is status post cardiac surgery on 9/13 and currently on ECMO. CRRT start 9/11. He remains anuric with fluid overload.  Continue to run CRRT at current prescription (resume post-op).  UF as tolerated by BP and ECMO, Angiomax for anticoagulation.  UFG: net even. Discussed with ICU RN at bedside. CRRT ordrers:  all fluids 4K/2.5Ca; dialysate 1500 mL/hr, pre- and post- filter at 400 mL/hr, sodium citrate for anticoagulation and UF goal 0 to +50 mL/hr for today.  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  Per CTS AHRF: VDRF, per primary service. Abx/antifungal per primary service Postcardiotomy vasoplegia/shock with failure to wean: Currently on ECMO per HF team.  On pressors per primary service.  Volume managing with CRRT. S/p washout 9/19. Started on midodrine. Hyperkalemia: Managed with CRRT prescription and  follow labs. K WNL Anemia, thrombocytopenia: Transfuse as needed per primary team. Hypophosphatemia - will replete and follow.   Irena Cords, MD [facility]

## 2023-05-30 NOTE — Progress Notes (Signed)
Patient ID: [name], male   DOB: 03/24/1961, 62 y.o.   MRN: [id number]     Advanced Heart Failure Rounding Note  PCP-Cardiologist: Kristeen Miss, MD   Subjective:    28-May-2023: OR for Bentall/AVR, TV repair, re-implantation of SVG-RCA.  Post-op vasoplegia and pulmonary edema, required central VA ECMO (RA drainage, ascending aorta return.  Post-op TEE EF 55%, mild-moderate RV hypokinesis.  9/14: Blender added to circuit due to high PaO2 9/16: TEE with EF 30%, moderate LVH, moderate RV dysfunction with mild enlargement, stable bioprosthetic aortic valve, repaired TV with mild TR, no endocarditis noted.  9/18: Bronchoscopy with mucus plugs and clots. 9/19: To OR for mediastinal washout.  By TEE, EF 50% with moderate LVH, mild RV enlargement and mild RV dysfunction, stable bioprosthetic aortic valve, repaired TV with mild TR.  He did not tolerate lowering of ECMO flows even with increasing of pressors due to vasoplegia.  9/21: Bronchoscopy  Remains intubated/sedated. On Central VA ECMO. Now on epinephrine 10, NE 5,  and vasopressin 0.04. CVVH running even.  CXR unchanged with diffuse right lung airspace disease.  CVP 5-6  On TPN.   ECMO flow turned down slightly yesterday and tolerating   Remains in junctional rhythm     Objective:   Weight Range: 82.8 kg Body mass index is 29.46 kg/m.   Vital Signs:   Temp:  [97.3 F (36.3 C)-97.9 F (36.6 C)] 97.7 F (36.5 C) (09/23 2015) Pulse Rate:  [64-77] 72 (09/23 2015) Resp:  [0-26] 24 (09/23 2015) BP: (105)/(62) 105/62 (09/23 0810) SpO2:  [98 %-100 %] 100 % (09/23 2015) Arterial Line BP: (78-131)/(47-77) 118/70 (09/23 2015) FiO2 (%):  [50 %] 50 % (09/23 2000) Weight:  [82.8 kg] 82.8 kg (09/23 0500) Last BM Date : 05/30/23  Weight change: Filed Weights   05/28/23 0600 05/29/23 0500 05/30/23 0500  Weight: 84.8 kg 82.7 kg 82.8 kg    Intake/Output:   Intake/Output Summary (Last 24 hours) at 05/30/2023 2028 Last data filed at  05/30/2023 2000 Gross per 24 hour  Intake 3978.81 ml  Output 3944 ml  Net 34.81 ml      Physical Exam    General:  Critically-ill appearing. On vent HEENT: + ETT Neck:  + swan and HD cath Cor: Chest open. Regular. + ecmo cannulas and CTs Lungs: coarse Abdomen: soft, nontender, nondistended. No hepatosplenomegaly. No bruits or masses. Good bowel sounds. Extremities: no cyanosis, clubbing, rash, edema Neuro: sedated   Telemetry   Junctional rhythm 70s + PVCs (personally reviewed)   Labs    CBC Recent Labs    05/29/23 1722 05/30/23 0002 05/30/23 0418 05/30/23 0613 05/30/23 1608 05/30/23 1945  WBC 49.2*  --  45.6*  --  44.9*  --   NEUTROABS 25.7*  --   --   --   --   --   HGB 7.9*   < > 8.9*   < > 8.6* 8.8*  HCT 24.5*   < > 27.2*   < > 26.2* 26.0*  MCV 90.7  --  92.5  --  93.6  --   PLT 17*  --  14*  --  14*  --    < > = values in this interval not displayed.   Basic Metabolic Panel Recent Labs    40/98/11 0418 05/30/23 0613 05/30/23 1608 05/30/23 1945  NA 134*   < > 135 136  K 3.7   < > 3.9 4.2  CL 102  --  102  --  CO2 22  --  24  --   GLUCOSE 118*  --  141*  --   BUN 22  --  16  --   CREATININE 1.13  --  1.20  --   CALCIUM 8.3*  --  8.0*  --   MG 2.4  --   --   --   PHOS 2.0*  --  4.0  --    < > = values in this interval not displayed.   Liver Function Tests Recent Labs    05/29/23 0407 05/29/23 1609 05/30/23 0418 05/30/23 1608  AST 71*  --  76*  --   ALT 8  --  9  --   ALKPHOS 100  --  99  --   BILITOT 2.3*  --  3.0*  --   PROT 6.3*  --  6.1*  --   ALBUMIN 1.9*  2.0*   < > 1.8* 1.9*   < > = values in this interval not displayed.   No results for input(s): "LIPASE", "AMYLASE" in the last 72 hours. Cardiac Enzymes No results for input(s): "CKTOTAL", "CKMB", "CKMBINDEX", "TROPONINI" in the last 72 hours.  BNP: BNP (last 3 results) Recent Labs    06/06/2023 1100  BNP 2,097.0*    ProBNP (last 3 results) No results for input(s):  "PROBNP" in the last 8760 hours.   D-Dimer No results for input(s): "DDIMER" in the last 72 hours. Hemoglobin A1C No results for input(s): "HGBA1C" in the last 72 hours.  Fasting Lipid Panel Recent Labs    05/30/23 0418  TRIG 122   Thyroid Function Tests No results for input(s): "TSH", "T4TOTAL", "T3FREE", "THYROIDAB" in the last 72 hours.  Invalid input(s): "FREET3"  Other results:   Imaging    DG CHEST PORT 1 VIEW  Result Date: 05/30/2023 CLINICAL DATA:  ECMO EXAM: PORTABLE CHEST 1 VIEW COMPARISON:  Some view chest x-ray 05/29/2023 FINDINGS: The patient is intubated. Mediastinal drain and bilateral chest tubes are in place. ECMO catheters stable. Dense consolidation laterally in the right lung is again noted. Edema on the left is stable. IMPRESSION: 1. Stable support apparatus. 2. Stable dense consolidation laterally in the right lung. Electronically Signed   By: Marin Roberts M.D.   On: 05/30/2023 07:44     Medications:     Scheduled Medications:  sodium chloride   Intravenous Once   sodium chloride   Intravenous Once   sodium chloride   Intravenous Once   sodium chloride   Intravenous Once   atorvastatin  80 mg Per Tube q1800   bisacodyl  10 mg Oral Daily   Or   bisacodyl  10 mg Rectal Daily   Chlorhexidine Gluconate Cloth  6 each Topical Daily   docusate  100 mg Per Tube BID   ezetimibe  10 mg Per Tube Daily   insulin aspart  0-15 Units Subcutaneous Q4H   metoCLOPramide (REGLAN) injection  5 mg Intravenous Q6H   neostigmine  0.25 mg Subcutaneous Q6H   mouth rinse  15 mL Mouth Rinse Q2H   pantoprazole (PROTONIX) IV  40 mg Intravenous QHS   sodium chloride flush  3 mL Intravenous Q12H    Infusions:   prismasol BGK 4/2.5 400 mL/hr at 05/30/23 1715    prismasol BGK 4/2.5 400 mL/hr at 05/30/23 1715   sodium chloride Stopped (05/24/23 0032)   sodium chloride Stopped (05/22/23 1310)   sodium chloride 10 mL/hr at 05/30/23 2000   albumin human Stopped  (05/30/23  6644)   anticoagulant sodium citrate     bivalirudin (ANGIOMAX) 250 mg in sodium chloride 0.9 % 500 mL (0.5 mg/mL) infusion 0.005 mg/kg/hr (05/30/23 2000)   dexmedetomidine (PRECEDEX) IV infusion 0.7 mcg/kg/hr (05/30/23 2000)   epinephrine 10 mcg/min (05/30/23 2000)   HYDROmorphone 3 mg/hr (05/30/23 2000)   insulin Stopped (05/21/23 0754)   lactated ringers Stopped (05/27/23 2100)   meropenem (MERREM) IV Stopped (05/30/23 1437)   micafungin (MYCAMINE) 200 mg in sodium chloride 0.9 % 100 mL IVPB Stopped (05/30/23 0913)   midazolam 7 mg/hr (05/30/23 2000)   norepinephrine (LEVOPHED) Adult infusion 2 mcg/min (05/30/23 2000)   prismasol BGK 4/2.5 1,500 mL/hr at 05/30/23 1952   TPN ADULT (ION) 40 mL/hr at 05/30/23 2000   vancomycin Stopped (05/29/23 2247)   vasopressin 0.04 Units/min (05/30/23 2000)    PRN Medications: sodium chloride, sodium chloride, albumin human, anticoagulant sodium citrate, artificial tears, dextrose, HYDROmorphone, midazolam, midazolam, morphine injection, ondansetron (ZOFRAN) IV, mouth rinse, oxyCODONE, sodium chloride flush, traMADol    Assessment/Plan   1. Post-cardiotomy vasoplegia/shock with failure to wean from CPB: Now on central VA ECMO (05/25/2023) with RA drainage/ascending aorta return. Post-op echo with EF 55%, mild-moderate RV dysfunction. Unable to get images yesterday by TTE.  TEE on 9/16 showed EF 30%, moderate LVH, moderate RV dysfunction with mild enlargement, stable bioprosthetic aortic valve, repaired TV with mild TR, no endocarditis noted.  To OR for mediastinal washout on 9/19.  By TEE 9/19, EF 50% with moderate LVH, mild RV enlargement and mild RV dysfunction, stable bioprosthetic aortic valve, repaired TV with mild TR.  He did not tolerate lowering of ECMO flows even with increasing of pressors due to vasoplegia. CXR with clearing left lung, right lung with persistent airspace disease not clearing with CVVH.  Impella 5.5 considered in OR, but  does not have access for this and with improved LV function not sure it would be helpful. ECMO flow now at 3.0 L/min. CVVH now running even. Plan was to to slowly lower ECMO flow over the weekend, aim for 0.5 L/min/day.  Today, he is on NE 5, epinephrine 10, vasopressin 0.04.  CVP 5-6.  - He is not making much progress with further ECMO wean now 10 days out. I have d/w Dr. Laneta Simmers and ECMO team. I think that further VA ECMO support will have more risk than benefit. Will pan to take to OR tomorrow for decannulation and switch to VV ECMO support. I discussed with his family and they realize that there is a chance that he may not tolerate this but there is no other option.  - Will continue/adjust current pressor support as need - Continue midodrine to 10 tid.  - Will run CVVH mildly positive, aim for CVP 8-12 for better hemodynamics.   - Continue bivalirudin today with goal PTT 50-70. Discussed dosing with PharmD personally. 2. Acute hypoxic respiratory failure, post-op: On vent, rest settings. Blender added to keep PaO2 down. Sweep remains 4.5. CXR with some clearing of left lung, right lung with ongoing diffuse airspace disease without change.  Suspect extensive PNA, think we have done what we can with CVVH for lung clearing at this point. Bronchoscopy yesterday with no change in CXR.  -  Plan as above with switch to VV ECMO tomorrow  - Meropenem/vancomycin/micafungin for coverage of PNA 3. 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   - Continuing meropenem/vancomycin/micafungin  4. CAD s/p CABG/AVR 2017:  s/p AVR/Bentall and re-implantation of SVG-RCA 05/16/2023. - Off ASA with platelets down.  5. ESRD: CVVH as above. Keep mildly positive 6. DM2: management per CCM 7. Heart block: Post-op, now in stable junctional rhythm.   8. H/o CVA: Prior cerebellar CVA.  9. Thrombocytopenia:  Post-op, 43 => 38 => 28 => 20 => 24 K => 19 => 20K today.  Suspect inflammatory and hemolysis.  - will need several packs of platelets prior to OR tomorrow. Ideally get PLTS > 50K 10. Anemia: Post-op, transfuse hgb < 8. 8.5 today. 11. ID: H/o MSSA bacteremia.  WBCs 54 => 53 => 45 => 48.7 => 48K => 42 => 53K => 45K now.  - Meropenem + vancomycin.  - Micafungin for fungal coverage added 9/20 12. FEN: TFs restarted slowly via [place].  Suspect aspiration last week.    CRITICAL CARE Performed by: Arvilla Meres  Total critical care time: 60 minutes  Critical care time was exclusive of separately billable procedures and treating other patients.  Critical care was necessary to treat or prevent imminent or life-threatening deterioration.  Critical care was time spent personally by me on the following activities: development of treatment plan with patient and/or surrogate as well as nursing, discussions with consultants, evaluation of patient's response to treatment, examination of patient, obtaining history from patient or surrogate, ordering and performing treatments and interventions, ordering and review of laboratory studies, ordering and review of radiographic studies, pulse oximetry and re-evaluation of patient's condition.    Length of Stay: 12  Arvilla Meres, MD  05/30/2023, 8:28 PM  Advanced Heart Failure Team Pager [phone] (M-F; 7a - 5p)  Please contact [facility] for night-coverage after hours (5p -7a ) and weekends on [email or web address]

## 2023-05-30 NOTE — Progress Notes (Signed)
NAME:  [name], MRN:  [id number], DOB:  11-25-1960, LOS: 12 ADMISSION DATE:  06/04/2023, CONSULTATION DATE:  9/11 REFERRING MD:  Dr. Izora Ribas, CHIEF COMPLAINT:  aortic valve dehiscence   History of Present Illness:  Patient is [name] w/ pertinent PMH CAD s/p CABG 2017 w/ AVR, HLD, HTN, prior CVA, T2DM, CKD4 was previously on HD presents to [facility] on 9/10 chest pain.  Patient recently admitted to [facility] on 5/25 w/ AKI and AMS. While in ED patient had PEA arrest w/ ROSC in about 8 minutes. Patient required dialysis on this admission. Also w/ MSSA bacteremia associated w/ tricuspid valve endocarditis. Patient transferred to [facility] on 5/25. Treated w/ 6 week course oxacillin and rifampin. This admission also suffered a R cerebellar CVA w/ MRI likely septic emboli and had cholangitis/pancreatitis requiring ERCP.  On 9/10 patient admitted to [facility] w/ chest pain, dyspnea, and BLE edema. BNP 2,097. CXR w/ pulm edema. Patient started on IV lasix infusion. Cards consulted. On 9/11 patient transferred to [facility]. Patient's echo showing possible aortic valve dehiscence. Cultures repeated and started on rocephin. Patient transferred to ICU for TEE and general anesthesia and to start CRRT. PCCM consulted.  Pertinent  Medical History   Past Medical History:  Diagnosis Date   Allergy    Anemia    Anxiety    Baker's cyst of knee    Blood transfusion without reported diagnosis    as baby    Coronary artery disease    quadruple bypass - March 2016   GERD (gastroesophageal reflux disease)    Gouty arthritis    "real bad" (01/17/2013)   Heart murmur    Hypercholesteremia    Hypertension    MSSA bacteremia 01/29/2023   Myocardial infarction (HCC) 2017   PEA (Pulseless electrical activity) (HCC) 01/29/2023   Stroke (HCC)    Type II diabetes mellitus (HCC)     Significant Hospital Events: Including procedures, antibiotic start and stop dates in addition to other pertinent events    9/10 admitted 9/11 echo showing aortic valve dehiscence, confirmed by TEE.  9/11 started (back) on CRRT for volume overload and known CKD 4 9/12 breathing better after CRRT 9/13 to OR for Redo of aortic valve, tricuspid valve repair and ascending aortic root replacement w/ re-implantation of SVG to RCA. Post op TEE EF 55% RV mid-mod HK, AVR/TV repair stable. Could not come off CPB. Worse pulmonary edema. High dose pressors. Cannulated and started on VA ECMO w/ central cannulation  9/16 MDT ECMO rounds this AM, plans to remove more volume, 1 U PRBCs  9/18 bronch, clot and mucus plugs removed  June 12, 2023 OR for washout  9/20 bronch 9/21 bronch  Interim History / Subjective:  Ended up going to suction yesterday and 2L of TF coming out of NGT. Pressor needs stable  Current gtt: bival, precedex, epi 10, dilaudid, midazolam 11, vaso 004, levo 2  Objective   Blood pressure 96/65, pulse 74, temperature (!) 97.5 F (36.4 C), resp. rate (!) 24, height 5\' 6"  (1.676 m), weight 82.8 kg, SpO2 100%. PAP: (26-39)/(15-26) 32/16 CVP:  [3 mmHg-14 mmHg] 4 mmHg CO:  [4.3 L/min-5.5 L/min] 4.7 L/min CI:  [2.2 L/min/m2-2.8 L/min/m2] 2.4 L/min/m2  Vent Mode: PRVC FiO2 (%):  [50 %] 50 % Set Rate:  [24 bmp] 24 bmp Vt Set:  [400 mL] 400 mL PEEP:  [5 cmH20] 5 cmH20 Plateau Pressure:  [30 cmH20] 30 cmH20   Intake/Output Summary (Last 24 hours)  at 05/30/2023 1610 Last data filed at 05/30/2023 0900 Gross per 24 hour  Intake 3834.11 ml  Output 5818 ml  Net -1983.89 ml   Filed Weights   05/28/23 0600 05/29/23 0500 05/30/23 0500  Weight: 84.8 kg 82.7 kg 82.8 kg    Examination: Sedated on vent Lungs actually sound more clear today, wonder if offloading stomach helped DP remains around 25 Abd soft, absent BS Anasarca improved Continuous air leak noted in one of the atriums connected to his open chest  CXR stable dense R peripheral infiltrates maybe slowly improving aeration ABG looks good, sweep 3 @  85% Flowing 2.9LPM LDH stable Plts 14  Assessment & Plan:   Post cardiotomy vasoplegia/cardioplegia w/ failure to wean from CPB Acute on chronic combined HF  Hx MSSA endocarditis S/p AVR/Bentall and reimplantation of Coronaries and TV repair  Acute respiratory failure w/ hypoxia due to volume overload, with untreated OSA CKD 4. Had recently been on HD- now on CRRT R sided HCAP postop- no growth on culture, therapeutic bronchoscopy x 3 dose for incessant R mucus plugging; unfortunately no improvement with this radiographically or by vent needs; some mold on BAL, already on micafungin Circuit related hemolysis, thrombocytopenia consumptive- stable, no transfusion needs at present L>R BLE swelling ->LE [place] neg for DVT- stable TF intolerance- ongoing issue CAD s/p CABG in 2017 w/ AVR HTN HLD Hx of CVA DMT2 Leukocytosis, multifactorial   - Pump changes per AHF/TCTS - Bival for [facility] - CRRT a little positive today in anticipation of trying to switch to VV - Sweep for pH 7.35-7.4 - Pressors for MAP 65 - Broad spectrum abx, f/u BAL culture, still NGTD - Insulin PRN - Heavy sedation for now - Plan tentatively is to tank up fluid status a bit today and try to convert to VV ECMO tomorrow via either RIJ or L  crescent with removal of central cannulation +/- chest closure; if BP does not tolerate this we may be out of options - Discussed above with AHF+TCTS+ECMO specialists  Best Practice (right click and "Reselect all SmartList Selections" daily)   Diet/type: TF DVT prophylaxis: bival GI prophylaxis: PPI Lines: Central line and Dialysis Catheter Foley: yes Code Status:  full code Last date of multidisciplinary goals of care discussion [updated son on phone, he will be by this afternoon]  32 min cc time Myrla Halsted MD [facility] 05/30/2023 9:03 AM

## 2023-05-31 ENCOUNTER — Inpatient Hospital Stay (HOSPITAL_COMMUNITY): Payer: PPO

## 2023-05-31 ENCOUNTER — Inpatient Hospital Stay (HOSPITAL_COMMUNITY): Payer: Self-pay

## 2023-05-31 ENCOUNTER — Encounter (HOSPITAL_COMMUNITY): Admission: EM | Disposition: E | Payer: Self-pay | Source: Home / Self Care | Attending: Surgery

## 2023-05-31 DIAGNOSIS — J9601 Acute respiratory failure with hypoxia: Secondary | ICD-10-CM | POA: Diagnosis not present

## 2023-05-31 DIAGNOSIS — R570 Cardiogenic shock: Secondary | ICD-10-CM | POA: Diagnosis not present

## 2023-05-31 DIAGNOSIS — T8111XA Postprocedural  cardiogenic shock, initial encounter: Secondary | ICD-10-CM | POA: Diagnosis not present

## 2023-05-31 DIAGNOSIS — I5033 Acute on chronic diastolic (congestive) heart failure: Secondary | ICD-10-CM

## 2023-05-31 DIAGNOSIS — I132 Hypertensive heart and chronic kidney disease with heart failure and with stage 5 chronic kidney disease, or end stage renal disease: Secondary | ICD-10-CM | POA: Diagnosis not present

## 2023-05-31 DIAGNOSIS — I509 Heart failure, unspecified: Secondary | ICD-10-CM | POA: Diagnosis not present

## 2023-05-31 DIAGNOSIS — N186 End stage renal disease: Secondary | ICD-10-CM | POA: Diagnosis not present

## 2023-05-31 DIAGNOSIS — I9789 Other postprocedural complications and disorders of the circulatory system, not elsewhere classified: Secondary | ICD-10-CM | POA: Diagnosis not present

## 2023-05-31 HISTORY — PX: TEE WITHOUT CARDIOVERSION: SHX5443

## 2023-05-31 HISTORY — PX: CANNULATION FOR ECMO (EXTRACORPOREAL MEMBRANE OXYGENATION): SHX6796

## 2023-05-31 LAB — POCT I-STAT 7, (LYTES, BLD GAS, ICA,H+H)
Acid-Base Excess: 0 mmol/L (ref 0.0–2.0)
Acid-Base Excess: 0 mmol/L (ref 0.0–2.0)
Acid-Base Excess: 1 mmol/L (ref 0.0–2.0)
Acid-Base Excess: 0 mmol/L (ref 0.0–2.0)
Acid-base deficit: 3 mmol/L — ABNORMAL HIGH (ref 0.0–2.0)
Acid-base deficit: 4 mmol/L — ABNORMAL HIGH (ref 0.0–2.0)
Acid-base deficit: 6 mmol/L — ABNORMAL HIGH (ref 0.0–2.0)
Acid-base deficit: 10 mmol/L — ABNORMAL HIGH (ref 0.0–2.0)
Acid-base deficit: 13 mmol/L — ABNORMAL HIGH (ref 0.0–2.0)
Acid-base deficit: 11 mmol/L — ABNORMAL HIGH (ref 0.0–2.0)
Bicarbonate: 25.9 mmol/L (ref 20.0–28.0)
Bicarbonate: 25.6 mmol/L (ref 20.0–28.0)
Bicarbonate: 25.4 mmol/L (ref 20.0–28.0)
Bicarbonate: 24.4 mmol/L (ref 20.0–28.0)
Bicarbonate: 21.6 mmol/L (ref 20.0–28.0)
Bicarbonate: 20.9 mmol/L (ref 20.0–28.0)
Bicarbonate: 19.1 mmol/L — ABNORMAL LOW (ref 20.0–28.0)
Bicarbonate: 17 mmol/L — ABNORMAL LOW (ref 20.0–28.0)
Bicarbonate: 14.4 mmol/L — ABNORMAL LOW (ref 20.0–28.0)
Bicarbonate: 15.6 mmol/L — ABNORMAL LOW (ref 20.0–28.0)
Calcium, Ion: 1.21 mmol/L (ref 1.15–1.40)
Calcium, Ion: 1.22 mmol/L (ref 1.15–1.40)
Calcium, Ion: 1.18 mmol/L (ref 1.15–1.40)
Calcium, Ion: 1.14 mmol/L — ABNORMAL LOW (ref 1.15–1.40)
Calcium, Ion: 1.18 mmol/L (ref 1.15–1.40)
Calcium, Ion: 1.28 mmol/L (ref 1.15–1.40)
Calcium, Ion: 1.23 mmol/L (ref 1.15–1.40)
Calcium, Ion: 1.19 mmol/L (ref 1.15–1.40)
Calcium, Ion: 1.16 mmol/L (ref 1.15–1.40)
Calcium, Ion: 1.08 mmol/L — ABNORMAL LOW (ref 1.15–1.40)
HCT: 26 % — ABNORMAL LOW (ref 39.0–52.0)
HCT: 28 % — ABNORMAL LOW (ref 39.0–52.0)
HCT: 23 % — ABNORMAL LOW (ref 39.0–52.0)
HCT: 25 % — ABNORMAL LOW (ref 39.0–52.0)
HCT: 26 % — ABNORMAL LOW (ref 39.0–52.0)
HCT: 28 % — ABNORMAL LOW (ref 39.0–52.0)
HCT: 25 % — ABNORMAL LOW (ref 39.0–52.0)
HCT: 23 % — ABNORMAL LOW (ref 39.0–52.0)
HCT: 21 % — ABNORMAL LOW (ref 39.0–52.0)
HCT: 24 % — ABNORMAL LOW (ref 39.0–52.0)
Hemoglobin: 8.8 g/dL — ABNORMAL LOW (ref 13.0–17.0)
Hemoglobin: 9.5 g/dL — ABNORMAL LOW (ref 13.0–17.0)
Hemoglobin: 7.8 g/dL — ABNORMAL LOW (ref 13.0–17.0)
Hemoglobin: 8.5 g/dL — ABNORMAL LOW (ref 13.0–17.0)
Hemoglobin: 8.8 g/dL — ABNORMAL LOW (ref 13.0–17.0)
Hemoglobin: 9.5 g/dL — ABNORMAL LOW (ref 13.0–17.0)
Hemoglobin: 8.5 g/dL — ABNORMAL LOW (ref 13.0–17.0)
Hemoglobin: 7.8 g/dL — ABNORMAL LOW (ref 13.0–17.0)
Hemoglobin: 7.1 g/dL — ABNORMAL LOW (ref 13.0–17.0)
Hemoglobin: 8.2 g/dL — ABNORMAL LOW (ref 13.0–17.0)
O2 Saturation: 99 %
O2 Saturation: 100 %
O2 Saturation: 100 %
O2 Saturation: 100 %
O2 Saturation: 100 %
O2 Saturation: 100 %
O2 Saturation: 100 %
O2 Saturation: 99 %
O2 Saturation: 99 %
O2 Saturation: 99 %
Patient temperature: 36.5
Patient temperature: 36.5
Patient temperature: 36.2
Patient temperature: 36.5
Patient temperature: 36.5
Patient temperature: 36.6
Patient temperature: 36.5
Patient temperature: 36.4
Potassium: 3.9 mmol/L (ref 3.5–5.1)
Potassium: 3.9 mmol/L (ref 3.5–5.1)
Potassium: 3.8 mmol/L (ref 3.5–5.1)
Potassium: 3.8 mmol/L (ref 3.5–5.1)
Potassium: 3.9 mmol/L (ref 3.5–5.1)
Potassium: 4.1 mmol/L (ref 3.5–5.1)
Potassium: 4.3 mmol/L (ref 3.5–5.1)
Potassium: 4.5 mmol/L (ref 3.5–5.1)
Potassium: 4.7 mmol/L (ref 3.5–5.1)
Potassium: 4.6 mmol/L (ref 3.5–5.1)
Sodium: 135 mmol/L (ref 135–145)
Sodium: 137 mmol/L (ref 135–145)
Sodium: 139 mmol/L (ref 135–145)
Sodium: 139 mmol/L (ref 135–145)
Sodium: 137 mmol/L (ref 135–145)
Sodium: 139 mmol/L (ref 135–145)
Sodium: 138 mmol/L (ref 135–145)
Sodium: 138 mmol/L (ref 135–145)
Sodium: 137 mmol/L (ref 135–145)
Sodium: 138 mmol/L (ref 135–145)
TCO2: 27 mmol/L (ref 22–32)
TCO2: 27 mmol/L (ref 22–32)
TCO2: 27 mmol/L (ref 22–32)
TCO2: 25 mmol/L (ref 22–32)
TCO2: 23 mmol/L (ref 22–32)
TCO2: 22 mmol/L (ref 22–32)
TCO2: 20 mmol/L — ABNORMAL LOW (ref 22–32)
TCO2: 18 mmol/L — ABNORMAL LOW (ref 22–32)
TCO2: 16 mmol/L — ABNORMAL LOW (ref 22–32)
TCO2: 17 mmol/L — ABNORMAL LOW (ref 22–32)
pCO2 arterial: 44.5 mmHg (ref 32–48)
pCO2 arterial: 44.6 mmHg (ref 32–48)
pCO2 arterial: 36.2 mmHg (ref 32–48)
pCO2 arterial: 35.9 mmHg (ref 32–48)
pCO2 arterial: 32.9 mmHg (ref 32–48)
pCO2 arterial: 33.6 mmHg (ref 32–48)
pCO2 arterial: 34.3 mmHg (ref 32–48)
pCO2 arterial: 39.3 mmHg (ref 32–48)
pCO2 arterial: 36.9 mmHg (ref 32–48)
pCO2 arterial: 35.5 mmHg (ref 32–48)
pH, Arterial: 7.37 (ref 7.35–7.45)
pH, Arterial: 7.365 (ref 7.35–7.45)
pH, Arterial: 7.454 — ABNORMAL HIGH (ref 7.35–7.45)
pH, Arterial: 7.441 (ref 7.35–7.45)
pH, Arterial: 7.422 (ref 7.35–7.45)
pH, Arterial: 7.399 (ref 7.35–7.45)
pH, Arterial: 7.351 (ref 7.35–7.45)
pH, Arterial: 7.242 — ABNORMAL LOW (ref 7.35–7.45)
pH, Arterial: 7.197 — CL (ref 7.35–7.45)
pH, Arterial: 7.247 — ABNORMAL LOW (ref 7.35–7.45)
pO2, Arterial: 135 mmHg — ABNORMAL HIGH (ref 83–108)
pO2, Arterial: 180 mmHg — ABNORMAL HIGH (ref 83–108)
pO2, Arterial: 381 mmHg — ABNORMAL HIGH (ref 83–108)
pO2, Arterial: 407 mmHg — ABNORMAL HIGH (ref 83–108)
pO2, Arterial: 396 mmHg — ABNORMAL HIGH (ref 83–108)
pO2, Arterial: 199 mmHg — ABNORMAL HIGH (ref 83–108)
pO2, Arterial: 181 mmHg — ABNORMAL HIGH (ref 83–108)
pO2, Arterial: 179 mmHg — ABNORMAL HIGH (ref 83–108)
pO2, Arterial: 172 mmHg — ABNORMAL HIGH (ref 83–108)
pO2, Arterial: 156 mmHg — ABNORMAL HIGH (ref 83–108)

## 2023-05-31 LAB — PREPARE PLATELET PHERESIS
Unit division: 0
Unit division: 0

## 2023-05-31 LAB — CBC
HCT: 25.3 % — ABNORMAL LOW (ref 39.0–52.0)
HCT: 27.8 % — ABNORMAL LOW (ref 39.0–52.0)
HCT: 24.8 % — ABNORMAL LOW (ref 39.0–52.0)
Hemoglobin: 8.3 g/dL — ABNORMAL LOW (ref 13.0–17.0)
Hemoglobin: 9.2 g/dL — ABNORMAL LOW (ref 13.0–17.0)
Hemoglobin: 8.1 g/dL — ABNORMAL LOW (ref 13.0–17.0)
MCH: 30.7 pg (ref 26.0–34.0)
MCH: 29.3 pg (ref 26.0–34.0)
MCH: 29.6 pg (ref 26.0–34.0)
MCHC: 32.8 g/dL (ref 30.0–36.0)
MCHC: 33.1 g/dL (ref 30.0–36.0)
MCHC: 32.7 g/dL (ref 30.0–36.0)
MCV: 93.7 fL (ref 80.0–100.0)
MCV: 88.5 fL (ref 80.0–100.0)
MCV: 90.5 fL (ref 80.0–100.0)
Platelets: 23 10*3/uL — CL (ref 150–400)
Platelets: 56 10*3/uL — ABNORMAL LOW (ref 150–400)
Platelets: 72 10*3/uL — ABNORMAL LOW (ref 150–400)
RBC: 2.7 MIL/uL — ABNORMAL LOW (ref 4.22–5.81)
RBC: 3.14 MIL/uL — ABNORMAL LOW (ref 4.22–5.81)
RBC: 2.74 MIL/uL — ABNORMAL LOW (ref 4.22–5.81)
RDW: 18.9 % — ABNORMAL HIGH (ref 11.5–15.5)
RDW: 17.2 % — ABNORMAL HIGH (ref 11.5–15.5)
RDW: 17.8 % — ABNORMAL HIGH (ref 11.5–15.5)
WBC: 44.3 10*3/uL — ABNORMAL HIGH (ref 4.0–10.5)
WBC: 33.2 10*3/uL — ABNORMAL HIGH (ref 4.0–10.5)
WBC: 42.7 10*3/uL — ABNORMAL HIGH (ref 4.0–10.5)
nRBC: 0.5 % — ABNORMAL HIGH (ref 0.0–0.2)
nRBC: 0.9 % — ABNORMAL HIGH (ref 0.0–0.2)
nRBC: 1.1 % — ABNORMAL HIGH (ref 0.0–0.2)

## 2023-05-31 LAB — RENAL FUNCTION PANEL
Albumin: 1.9 g/dL — ABNORMAL LOW (ref 3.5–5.0)
Albumin: 1.8 g/dL — ABNORMAL LOW (ref 3.5–5.0)
Anion gap: 11 (ref 5–15)
Anion gap: 10 (ref 5–15)
BUN: 14 mg/dL (ref 8–23)
BUN: 17 mg/dL (ref 8–23)
CO2: 23 mmol/L (ref 22–32)
CO2: 21 mmol/L — ABNORMAL LOW (ref 22–32)
Calcium: 8.7 mg/dL — ABNORMAL LOW (ref 8.9–10.3)
Calcium: 8.9 mg/dL (ref 8.9–10.3)
Chloride: 101 mmol/L (ref 98–111)
Chloride: 106 mmol/L (ref 98–111)
Creatinine, Ser: 1.06 mg/dL (ref 0.61–1.24)
Creatinine, Ser: 1.26 mg/dL — ABNORMAL HIGH (ref 0.61–1.24)
GFR, Estimated: >60 mL/min (ref >60)
GFR, Estimated: >60 mL/min (ref >60)
Glucose, Bld: 172 mg/dL — ABNORMAL HIGH (ref 70–99)
Glucose, Bld: 117 mg/dL — ABNORMAL HIGH (ref 70–99)
Phosphorus: 2.8 mg/dL (ref 2.5–4.6)
Phosphorus: 4.8 mg/dL — ABNORMAL HIGH (ref 2.5–4.6)
Potassium: 3.8 mmol/L (ref 3.5–5.1)
Potassium: 3.8 mmol/L (ref 3.5–5.1)
Sodium: 135 mmol/L (ref 135–145)
Sodium: 137 mmol/L (ref 135–145)

## 2023-05-31 LAB — BPAM PLATELET PHERESIS
Blood Product Expiration Date: 202409242359
Blood Product Expiration Date: 202409252359
ISSUE DATE / TIME: 202409232110
ISSUE DATE / TIME: 202409232110
Unit Type and Rh: 6200
Unit Type and Rh: 6200

## 2023-05-31 LAB — MAGNESIUM: Magnesium: 2.5 mg/dL — ABNORMAL HIGH (ref 1.7–2.4)

## 2023-05-31 LAB — GLOBAL TEG PANEL
CFF Max Amplitude: 40.9 mm — ABNORMAL HIGH (ref 15–32)
CK with Heparinase (R): 6.8 min (ref 4.3–8.3)
Citrated Functional Fibrinogen: 746.4 mg/dL — ABNORMAL HIGH (ref 278–581)
Citrated Kaolin (K): 1.1 min (ref 0.8–2.1)
Citrated Kaolin (MA): 69 mm (ref 52–69)
Citrated Kaolin (R): 7.5 min (ref 4.6–9.1)
Citrated Kaolin Angle: 74.6 deg (ref 63–78)
Citrated Rapid TEG (MA): 66.4 mm (ref 52–70)

## 2023-05-31 LAB — PREPARE RBC (CROSSMATCH)

## 2023-05-31 LAB — GLUCOSE, CAPILLARY
Glucose-Capillary: 104 mg/dL — ABNORMAL HIGH (ref 70–99)
Glucose-Capillary: 105 mg/dL — ABNORMAL HIGH (ref 70–99)
Glucose-Capillary: 118 mg/dL — ABNORMAL HIGH (ref 70–99)
Glucose-Capillary: 122 mg/dL — ABNORMAL HIGH (ref 70–99)
Glucose-Capillary: 143 mg/dL — ABNORMAL HIGH (ref 70–99)
Glucose-Capillary: 144 mg/dL — ABNORMAL HIGH (ref 70–99)
Glucose-Capillary: 145 mg/dL — ABNORMAL HIGH (ref 70–99)
Glucose-Capillary: 150 mg/dL — ABNORMAL HIGH (ref 70–99)
Glucose-Capillary: 150 mg/dL — ABNORMAL HIGH (ref 70–99)
Glucose-Capillary: 153 mg/dL — ABNORMAL HIGH (ref 70–99)
Glucose-Capillary: 159 mg/dL — ABNORMAL HIGH (ref 70–99)
Glucose-Capillary: 162 mg/dL — ABNORMAL HIGH (ref 70–99)
Glucose-Capillary: 169 mg/dL — ABNORMAL HIGH (ref 70–99)
Glucose-Capillary: 171 mg/dL — ABNORMAL HIGH (ref 70–99)
Glucose-Capillary: 184 mg/dL — ABNORMAL HIGH (ref 70–99)

## 2023-05-31 LAB — LACTIC ACID, PLASMA
Lactic Acid, Venous: 1.5 mmol/L (ref 0.5–1.9)
Lactic Acid, Venous: 2.6 mmol/L (ref 0.5–1.9)

## 2023-05-31 LAB — HEPATIC FUNCTION PANEL
ALT: 9 U/L (ref 0–44)
AST: 69 U/L — ABNORMAL HIGH (ref 15–41)
Albumin: 1.9 g/dL — ABNORMAL LOW (ref 3.5–5.0)
Alkaline Phosphatase: 99 U/L (ref 38–126)
Bilirubin, Direct: 1.7 mg/dL — ABNORMAL HIGH (ref 0.0–0.2)
Indirect Bilirubin: 1.5 mg/dL — ABNORMAL HIGH (ref 0.3–0.9)
Total Bilirubin: 3.2 mg/dL — ABNORMAL HIGH (ref 0.3–1.2)
Total Protein: 6.5 g/dL (ref 6.5–8.1)

## 2023-05-31 LAB — ECHO INTRAOPERATIVE TEE
Height: 66 in
Weight: 2987.67 oz

## 2023-05-31 LAB — APTT: aPTT: 67 seconds — ABNORMAL HIGH (ref 24–36)

## 2023-05-31 LAB — CG4 I-STAT (LACTIC ACID)
Lactic Acid, Venous: 10.6 mmol/L (ref 0.5–1.9)
Lactic Acid, Venous: 11.9 mmol/L (ref 0.5–1.9)

## 2023-05-31 LAB — LACTATE DEHYDROGENASE: LDH: 1008 U/L — ABNORMAL HIGH (ref 98–192)

## 2023-05-31 LAB — PROTIME-INR
INR: 1.4 — ABNORMAL HIGH (ref 0.8–1.2)
Prothrombin Time: 17.7 seconds — ABNORMAL HIGH (ref 11.4–15.2)

## 2023-05-31 LAB — FIBRINOGEN
Fibrinogen: 743 mg/dL — ABNORMAL HIGH (ref 210–475)
Fibrinogen: 558 mg/dL — ABNORMAL HIGH (ref 210–475)

## 2023-05-31 SURGERY — CANNULATION FOR ECMO (EXTRACORPOREAL MEMBRANE OXYGENATION)
Anesthesia: General

## 2023-05-31 MED ORDER — HEPARIN 6000 UNIT IRRIGATION SOLUTION
Status: AC
  Filled 2023-05-31: qty 500

## 2023-05-31 MED ORDER — THROMBIN 20000 UNITS EX SOLR: OROMUCOSAL | Status: DC | PRN

## 2023-05-31 MED ORDER — TRACE MINERALS CU-MN-SE-ZN 300-55-60-3000 MCG/ML IV SOLN
INTRAVENOUS | Status: AC
  Filled 2023-05-31: qty 876

## 2023-05-31 MED ORDER — SODIUM CHLORIDE 0.9% IV SOLUTION: Freq: Once | INTRAVENOUS | Status: DC

## 2023-05-31 MED ORDER — SODIUM BICARBONATE 8.4 % IV SOLN
100.0000 meq | Freq: Once | INTRAVENOUS | Status: AC
  Administered 2023-05-31: 100 meq via INTRAVENOUS

## 2023-05-31 MED ORDER — THROMBIN (RECOMBINANT) 20000 UNITS EX SOLR
CUTANEOUS | Status: AC
  Filled 2023-05-31: qty 20000

## 2023-05-31 MED ORDER — NOREPINEPHRINE BITARTRATE 1 MG/ML IV SOLN
INTRAVENOUS | Status: DC | PRN
  Administered 2023-05-31: .5 mL via INTRAVENOUS

## 2023-05-31 MED ORDER — ALBUMIN HUMAN 5 % IV SOLN: INTRAVENOUS | Status: DC | PRN

## 2023-05-31 MED ORDER — 0.9 % SODIUM CHLORIDE (POUR BTL) OPTIME
TOPICAL | Status: DC | PRN
Start: 2023-05-31 — End: 2023-05-31
  Administered 2023-05-31: 1000 mL

## 2023-05-31 MED ORDER — CALCIUM CHLORIDE 10 % IV SOLN
INTRAVENOUS | Status: DC | PRN
Start: 2023-05-31 — End: 2023-05-31
  Administered 2023-05-31 (×2): 500 mg via INTRAVENOUS

## 2023-05-31 MED ORDER — SODIUM CHLORIDE 0.9 % IR SOLN
Status: DC | PRN
  Administered 2023-05-31 (×2): 1000 mL

## 2023-05-31 MED ORDER — THROMBIN 20000 UNITS EX KIT
PACK | CUTANEOUS | Status: DC | PRN
Start: 2023-05-31 — End: 2023-05-31
  Administered 2023-05-31: 20000 [IU] via TOPICAL

## 2023-05-31 MED ORDER — SODIUM BICARBONATE 8.4 % IV SOLN
INTRAVENOUS | Status: DC
  Filled 2023-05-31: qty 1000

## 2023-05-31 MED ORDER — HEPARIN 6000 UNIT IRRIGATION SOLUTION
Status: DC | PRN
Start: 2023-05-31 — End: 2023-05-31
  Administered 2023-05-31: 1

## 2023-05-31 SURGICAL SUPPLY — 78 items
BAG BANDED W/RUBBER/TAPE 36X54 (MISCELLANEOUS) IMPLANT
BAG DECANTER FOR FLEXI CONT (MISCELLANEOUS) ×2 IMPLANT
BAG EQP BAND 135X91 W/RBR TAPE (MISCELLANEOUS) ×1
BLADE CLIPPER SURG (BLADE) ×2 IMPLANT
BLADE SURG 11 STRL SS (BLADE) ×2 IMPLANT
CANISTER WOUNDNEG PRESSURE 500 (CANNISTER) IMPLANT
CANNULA FEMORAL ART 14 SM (MISCELLANEOUS) IMPLANT
CATH DUAL LUMEN CRESCENT 30FR (CATHETERS) IMPLANT
CATH ROBINSON RED A/P 18FR (CATHETERS) IMPLANT
CATH THORACIC 28FR (CATHETERS) IMPLANT
CATH THORACIC 28FR RT ANG (CATHETERS) IMPLANT
CATH THORACIC 36FR (CATHETERS) IMPLANT
CATH THORACIC 36FR RT ANG (CATHETERS) IMPLANT
CLIP TI LARGE 6 (CLIP) ×2 IMPLANT
CONN Y 3/8X3/8X3/8 BEN (MISCELLANEOUS) IMPLANT
CONNECTOR STRAIGHT 3/8 (MISCELLANEOUS) IMPLANT
CONTAINER PROTECT SURGISLUSH (MISCELLANEOUS) ×4 IMPLANT
COVER DOME SNAP 22 D (MISCELLANEOUS) IMPLANT
COVER PROBE W GEL 5X96 (DRAPES) ×2 IMPLANT
COVER SURGICAL LIGHT HANDLE (MISCELLANEOUS) ×2 IMPLANT
DRAPE C-ARM 42X72 X-RAY (DRAPES) ×2 IMPLANT
DRAPE CARDIOVASCULAR INCISE (DRAPES) ×1
DRAPE CV SPLIT W-CLR ANES SCRN (DRAPES) IMPLANT
DRAPE INCISE IOBAN 66X45 STRL (DRAPES) ×2 IMPLANT
DRAPE PERI GROIN 82X75IN TIB (DRAPES) IMPLANT
DRAPE SLUSH/WARMER DISC (DRAPES) ×2 IMPLANT
DRAPE SRG 135X102X78XABS (DRAPES) ×2 IMPLANT
DRESSING PEEL AND PLAC PRVNA20 (GAUZE/BANDAGES/DRESSINGS) IMPLANT
DRSG PEEL AND PLACE PREVENA 20 (GAUZE/BANDAGES/DRESSINGS) ×1
DRSG TEGADERM 4X4.5 CHG (GAUZE/BANDAGES/DRESSINGS) IMPLANT
ELECT CAUTERY BLADE 6.4 (BLADE) ×2 IMPLANT
ELECT REM PT RETURN 9FT ADLT (ELECTROSURGICAL) ×1
ELECTRODE REM PT RTRN 9FT ADLT (ELECTROSURGICAL) ×2 IMPLANT
FELT TEFLON 1X6 (MISCELLANEOUS) IMPLANT
GAUZE 4X4 16PLY ~~LOC~~+RFID DBL (SPONGE) ×2 IMPLANT
GAUZE SPONGE 4X4 12PLY STRL (GAUZE/BANDAGES/DRESSINGS) IMPLANT
GLOVE BIO SURGEON STRL SZ7.5 (GLOVE) ×2 IMPLANT
GOWN STRL REUS W/ TWL LRG LVL3 (GOWN DISPOSABLE) ×8 IMPLANT
GOWN STRL REUS W/TWL LRG LVL3 (GOWN DISPOSABLE) ×4
HEMOSTAT POWDER SURGIFOAM 1G (HEMOSTASIS) IMPLANT
KIT BASIN OR (CUSTOM PROCEDURE TRAY) ×2 IMPLANT
KIT DILATOR VASC 18G NDL (KITS) ×2 IMPLANT
KIT VASCULAR ACCESS OPUS (MISCELLANEOUS) IMPLANT
NDL PERC 18GX7CM (NEEDLE) IMPLANT
NEEDLE PERC 18GX7CM (NEEDLE) ×1 IMPLANT
NS IRRIG 1000ML POUR BTL (IV SOLUTION) ×4 IMPLANT
PACK GENERAL/GYN (CUSTOM PROCEDURE TRAY) ×2 IMPLANT
PAD ARMBOARD 7.5X6 YLW CONV (MISCELLANEOUS) ×2 IMPLANT
SCRUB POVIDONE IODINE 4 OZ (MISCELLANEOUS) ×2 IMPLANT
SET MICROPUNCTURE 5F STIFF (MISCELLANEOUS) IMPLANT
SET PRESSURE INFUSION 24 (MISCELLANEOUS) ×2 IMPLANT
SHEATH AVANTI 11CM 5FR (SHEATH) ×2 IMPLANT
SHEATH BRITE TIP 7FRX11 (SHEATH) IMPLANT
SOL PREP POV-IOD 4OZ 10% (MISCELLANEOUS) ×2 IMPLANT
SPONGE T-LAP 18X18 ~~LOC~~+RFID (SPONGE) ×8 IMPLANT
SPONGE T-LAP 4X18 ~~LOC~~+RFID (SPONGE) ×2 IMPLANT
STAPLER VISISTAT 35W (STAPLE) IMPLANT
SUT ETHIBOND 2 0 SH (SUTURE) ×2
SUT ETHIBOND 2 0 SH 36X2 (SUTURE) IMPLANT
SUT ETHILON 2 0 PSLX (SUTURE) IMPLANT
SUT ETHILON 2 LR (SUTURE) IMPLANT
SUT PERMA SILK 0 CT1 (SUTURE) IMPLANT
SUT PROLENE 3 0 SH DA (SUTURE) IMPLANT
SUT PROLENE 5 0 C 1 36 (SUTURE) IMPLANT
SUT SILK 1 MH (SUTURE) ×12 IMPLANT
SUT STEEL STERNAL CCS#1 18IN (SUTURE) IMPLANT
SUT STEEL SZ 6 DBL 3X14 BALL (SUTURE) IMPLANT
SYR 10ML LL (SYRINGE) ×2 IMPLANT
SYR 30ML LL (SYRINGE) ×2 IMPLANT
SYSTEM SAHARA CHEST DRAIN ATS (WOUND CARE) IMPLANT
TAPE CLOTH SURG 6X10 WHT LF (GAUZE/BANDAGES/DRESSINGS) IMPLANT
TOWEL GREEN STERILE (TOWEL DISPOSABLE) ×2 IMPLANT
TOWEL GREEN STERILE FF (TOWEL DISPOSABLE) ×2 IMPLANT
TRAY FOLEY MTR SLVR 16FR STAT (SET/KITS/TRAYS/PACK) ×2 IMPLANT
TUBING MEDICAL 3X8X3X32 (MISCELLANEOUS) IMPLANT
WATER STERILE IRR 1000ML POUR (IV SOLUTION) ×4 IMPLANT
WIRE AMPLATZ SSTIFF .035X260CM (WIRE) IMPLANT
WIRE BENTSON .035X145CM (WIRE) IMPLANT

## 2023-05-31 NOTE — Progress Notes (Signed)
ANTICOAGULATION CONSULT NOTE - Follow up Consult  Pharmacy Consult for bivalirudin Indication:  VA ECMO  Allergies  Allergen Reactions   Other Anaphylaxis    Mushrooms  Not listed on MAR    Plavix [Clopidogrel Bisulfate] Other (See Comments)    TTP Not listed on the [employer]   Fleet Enema [Enema] Other (See Comments)    Unknown reaction   Lovenox [Enoxaparin] Other (See Comments)    Unknown reaction   Morphine Other (See Comments)    Unknown reaction   Nsaids Other (See Comments)    Unknown reaction   Hydrocodone-Acetaminophen Itching    Patient Measurements: Height: 5\' 6"  (167.6 cm) Weight: 84.7 kg (186 lb 11.7 oz) IBW/kg (Calculated) : 63.8 Heparin Dosing Weight: 81.3 kg  Vital Signs: Temp: 97.7 F (36.5 C) (09/24 0445) Temp Source: Core (09/24 0035) Pulse Rate: 70 (09/24 0445)  Labs: Recent Labs    05/29/23 0407 05/29/23 0751 05/30/23 0418 05/30/23 1308 05/30/23 1608 05/30/23 1945 05/31/23 0417 05/31/23 0418  HGB 8.5*   < > 8.9*   < > 8.6* 8.8* 8.8* 8.3*  HCT 26.1*   < > 27.2*   < > 26.2* 26.0* 26.0* 25.3*  PLT 20*   < > 14*  --  14*  --   --  23*  APTT 71*   < > 60*  --  71*  --   --  67*  LABPROT 18.9*  --  18.5*  --   --   --   --  17.7*  INR 1.6*  --  1.5*  --   --   --   --  1.4*  CREATININE 1.11   < > 1.13  --  1.20  --   --  1.06   < > = values in this interval not displayed.    Estimated Creatinine Clearance: 72.8 mL/min (by C-G formula based on SCr of 1.06 mg/dL).   Assessment: 62 yom underwent Bentall/AVR, TV repair, re-implantation of SVG-RCA complicated with vasoplegia and pulmonary edema requiring central VA ECMO.  No AC PTA.  aPTT 67- therapeutic   Goal of Therapy:  aPTT 50-70 seconds Monitor platelets by anticoagulation protocol: Yes   Plan:  Continue bivalirudin 0.005 mg/kg/hr  Monitor q12 hr aPTT and CBC, and for s/sx of bleeding.  Calton Dach, PharmD, BCCCP Clinical Pharmacist 05/31/2023 5:40 AM

## 2023-05-31 NOTE — Progress Notes (Signed)
Pt transported on vent to OR w/o any complications. CRNA placed pt on their vent.

## 2023-05-31 NOTE — Op Note (Signed)
  CARDIOVASCULAR SURGERY OPERATIVE NOTE  05/31/2023  Surgeon:  Alleen Borne, MD  First Assistant: RNFA   Preoperative Diagnosis:  VA ECMO s/p aortic root replacement.  Postoperative Diagnosis:  Same   Procedure:  Conversion to veno-venous ECMO via right internal jugular Crescent cannula. Central decannulation and closure of chest.  Anesthesia:  General Endotracheal   Clinical History/Surgical Indication:  The patient is a 62 year old gentleman on HD with hx of bioprosthetic AVR and CABG in 2017 who presented with CHF and was found to have partial dehiscence of the aortic valve prosthesis and severe periprosthetic AI. He underwent redo sternotomy with removal of the old prosthesis and aortic root replacement using a Homograft valve conduit with reimplantation of the coronary arteries and extension of one of his vein grafts to the RCA and tricuspid valve annuloplasty for severe TR on 05/20/2023. He has made gradual improvement and our multidisciplinary ECMO team felt that he was ready for conversion to VV ECMO and central decannulation. I discussed the procedure, alternatives and risks with the patient's son and father and they agreed to proceed and son signed the consent.  Preparation:  The patient was taken directly back to the OR room from the ICU. He was already on broad spectrum antibiotics. After being placed under general endotracheal anesthesia by the anesthesia team the old dressings were removed and the neck, chest, abdomen, and both groins were prepped with betadine soap and solution and draped in the usual sterile manner. A surgical time-out was taken and the correct patient and operative procedure were confirmed with the nursing and anesthesia staff.   Procedure:  Aright internal jugular Crescent cannula was inserted by Dr. Gala Romney and the patient was converted to VV ECMO after  weaning VA ECMO to 1.5 L/min. This will be dictated by Dr. Gala Romney. He remained hemodynamically stable on VV ECMO. The aortic and right atrial cannula were removed. The old chest tubes were removed since they were clotted. There was some clot in the anterior mediastinum that was removed. The sternal bone was scraped with a curette to remove the residual bone wax and fibrinous debris. The subcutaneous tissue was debrided using the suction irrigator with saline. The skin edge appeared necrotic and this was sharply incised and resection with a scalpel. Then new chest tubes were placed with bilateral pleural, posterior pericardium and anterior mediastinal. The sternum was closed with double #6 stainless steel wires. The subcutaneous tissue was approximated using interrupted 2-0 nylon suture tied over red rubber bolsters. The skin was approximated with staples. A Prevena suction wound dressing was applied. Dry sterile dressings were placed around the chest tubes which were connected to pleurevac suction. The patient was then transported to the surgical intensive care unit in critical but stable condition.

## 2023-05-31 NOTE — Progress Notes (Signed)
NAME:  [name], MRN:  [id number], DOB:  01-29-1961, LOS: 13 ADMISSION DATE:  05/29/2023, CONSULTATION DATE:  9/11 REFERRING MD:  Dr. Izora Ribas, CHIEF COMPLAINT:  aortic valve dehiscence   History of Present Illness:  Patient is [name] w/ pertinent PMH CAD s/p CABG 2017 w/ AVR, HLD, HTN, prior CVA, T2DM, CKD4 was previously on HD presents to [facility] on 9/10 chest pain.  Patient recently admitted to [facility] on 5/25 w/ AKI and AMS. While in ED patient had PEA arrest w/ ROSC in about 8 minutes. Patient required dialysis on this admission. Also w/ MSSA bacteremia associated w/ tricuspid valve endocarditis. Patient transferred to [facility] on 5/25. Treated w/ 6 week course oxacillin and rifampin. This admission also suffered a R cerebellar CVA w/ MRI likely septic emboli and had cholangitis/pancreatitis requiring ERCP.  On 9/10 patient admitted to [facility] w/ chest pain, dyspnea, and BLE edema. BNP 2,097. CXR w/ pulm edema. Patient started on IV lasix infusion. Cards consulted. On 9/11 patient transferred to [facility]. Patient's echo showing possible aortic valve dehiscence. Cultures repeated and started on rocephin. Patient transferred to ICU for TEE and general anesthesia and to start CRRT. PCCM consulted.  Pertinent  Medical History   Past Medical History:  Diagnosis Date   Allergy    Anemia    Anxiety    Baker's cyst of knee    Blood transfusion without reported diagnosis    as baby    Coronary artery disease    quadruple bypass - March 2016   GERD (gastroesophageal reflux disease)    Gouty arthritis    "real bad" (01/17/2013)   Heart murmur    Hypercholesteremia    Hypertension    MSSA bacteremia 01/29/2023   Myocardial infarction (HCC) 2017   PEA (Pulseless electrical activity) (HCC) 01/29/2023   Stroke (HCC)    Type II diabetes mellitus (HCC)     Significant Hospital Events: Including procedures, antibiotic start and stop dates in addition to other pertinent events    9/10 admitted 9/11 echo showing aortic valve dehiscence, confirmed by TEE.  9/11 started (back) on CRRT for volume overload and known CKD 4 9/12 breathing better after CRRT 9/13 to OR for Redo of aortic valve, tricuspid valve repair and ascending aortic root replacement w/ re-implantation of SVG to RCA. Post op TEE EF 55% RV mid-mod HK, AVR/TV repair stable. Could not come off CPB. Worse pulmonary edema. High dose pressors. Cannulated and started on VA ECMO w/ central cannulation  9/16 MDT ECMO rounds this AM, plans to remove more volume, 1 U PRBCs  9/18 bronch, clot and mucus plugs removed  9/19 OR for washout  9/20 bronch 9/21 bronch  Interim History / Subjective:  No events. Stable pressor needs. Planned for OR this afternoon.  Objective   Blood pressure 94/66, pulse 68, temperature 97.7 F (36.5 C), resp. rate (!) 24, height 5\' 6"  (1.676 m), weight 84.7 kg, SpO2 100%. PAP: (34-63)/(16-45) 35/16 CVP:  [2 mmHg-13 mmHg] 5 mmHg CO:  [3.9 L/min-5.3 L/min] 3.9 L/min CI:  [2.02 L/min/m2-2.7 L/min/m2] 2.02 L/min/m2  Vent Mode: PRVC FiO2 (%):  [50 %] 50 % Set Rate:  [24 bmp] 24 bmp Vt Set:  [400 mL] 400 mL PEEP:  [5 cmH20] 5 cmH20 Pressure Support:  [5 cmH20] 5 cmH20 Plateau Pressure:  [30 cmH20-35 cmH20] 30 cmH20   Intake/Output Summary (Last 24 hours) at 05/15/2023 1118 Last data filed at 05/21/2023 1100 Gross per 24 hour  Intake 4169.55 ml  Output 4083 ml  Net 86.55 ml   Filed Weights   05/29/23 0500 05/30/23 0500 10-Jun-2023 0500  Weight: 82.7 kg 82.8 kg 84.7 kg    Examination: Sedated on vent Lungs stable driving pressures and breath sonds Abd soft, absent BS stable Anasarca improved Air leak still present but improved on pleural drain  CXR stable R infiltrates Mold and candida on BAL Valve cultures neg Bilirubin climbing Plts 23 after transfusion yesterday Stable leukocytosis Stable coagulopathy and hemolysis parameters  Assessment & Plan:   Post cardiotomy  vasoplegia/cardioplegia w/ failure to wean from CPB Acute on chronic combined HF  Hx MSSA endocarditis S/p AVR/Bentall and reimplantation of Coronaries and TV repair  Acute respiratory failure w/ hypoxia due to volume overload, with untreated OSA CKD 4. Had recently been on HD- now on CRRT R sided HCAP postop-therapeutic bronchoscopy x 3 dose for incessant R mucus plugging; unfortunately no improvement with this radiographically or by vent needs; some mold/candida on BAL, already on micafungin Circuit related hemolysis, thrombocytopenia consumptive- stable, no transfusion needs at present L>R BLE swelling ->LE [place] neg for DVT- stable TF intolerance- ongoing issue now on TPN CAD s/p CABG in 2017 w/ AVR HTN HLD Hx of CVA DMT2 Leukocytosis, multifactorial   - Pump changes per AHF/TCTS - Bival for AC - CRRT even - Sweep for pH 7.35-7.4 - Pressors for MAP 65 - TPN per pharmD, NGT to LIS - Broad spectrum abx, duration TBD - Insulin PRN - Heavy sedation for now - Plan is to convert to VV ECMO today and central VA decannulation.  If fails this he is out of options and at EOL unfortunately. - Discussed above with AHF+TCTS+ECMO specialists  Best Practice (right click and "Reselect all SmartList Selections" daily)   Diet/type: TPN DVT prophylaxis: bival GI prophylaxis: PPI Lines: Central line and Dialysis Catheter Foley: yes Code Status:  full code Last date of multidisciplinary goals of care discussion [updated son by phone yesterday as did Dr. Laneta Simmers  34 min cc time Myrla Halsted MD  Pulmonary Critical Care 06/27/2023 11:18 AM

## 2023-05-31 NOTE — CV Procedure (Addendum)
   VV ECMO Cannulation Note  Operators: Nicholes Mango, MD Evelene Croon, MD  Indication: Acute hypoxic respiratory failure   Description of procedure:  The patient had been maintained on central VA ECMO for post-cardiotomy shock. His hemodynamics have improved but he continues with severe respiratory failure. After an appropriate ECMO weaning trial the patient was brought to the OR for VV ECMO cannulation and VA ECMO decannulation and chest closure.  The patient was prepped and draped on the table. An appropriate timeout was taken. A 7FR sheath was placed in the RIJ under u/s guidance. A long stiff wire was passed through the sheath and into the distal IVC under fluoro guidance. The 7FR sheath was removed and the tract was serially dilated and a 30 FR Crescent dual lumen ECMO cannula was placed with the outflow port positioned in the RA adjacent to the tricuspid valve. The wire and dilator were removed and both ports were burped and clamped. Wet-to wet connections were made to the ECMO tubing and the lines were clamped. Attention was then turned to the existing VA ECMO circuit. ECMO was clamped off and the lines were cut. Wet-to wet connections were the made to the appropriate connection on the VV ECMO circuit and flow was resumed. There was good color change. Cannula position was confirmed under TEE guidance and the cannula was sutured in place.  The patient's hemodynamics remained stable off of VA ECMO and his drips were adjusted with assistance from the anesthesia team.   I scrubbed out and Dr. Laneta Simmers proceed with removing the central VA cannulas and chest closure (see full operative note).   No apparent complications.   Estimated blood loss ~ 50-100cc,   Arvilla Meres, MD  6:44 PM

## 2023-05-31 NOTE — Progress Notes (Signed)
Pt transported on vent from OR to 2H23 w/o any complications.

## 2023-05-31 NOTE — Progress Notes (Signed)
Patient ID: [name], male   DOB: May 21, 1961, 62 y.o.   MRN: [id number]   Extracorporeal support note   ECLS support day: Indication: Post-op vasoplegia and pulmonary edema  Configuration: Central VA ECMO  Drainage cannula: RA Return cannula: Ascending aorta  Pump speed: 2700 rpm  Pump flow: 2.6 L/min Pump used: Cardiohelp  Sweep gas: 4 with blender at 85%to lower PaO2  Circuit check: Small clot at 12p Anticoagulant: Bivalirudin Anticoagulation targets: PTT 50-70  Changes in support: none  Anticipated goals/duration of support: We turned down ECMO support and increased NE/epi and VP and patient tolerated well. Plan to transition to decannulated central ECMO today and transition to VV ECMO.   Arvilla Meres MD  05/31/2023, 12:28 PM

## 2023-05-31 NOTE — Progress Notes (Signed)
Patient ID: [name], male   DOB: 12/31/1960, 62 y.o.   MRN: [id number]     Advanced Heart Failure Rounding Note  PCP-Cardiologist: Kristeen Miss, MD   Subjective:    9/13: OR for Bentall/AVR, TV repair, re-implantation of SVG-RCA.  Post-op vasoplegia and pulmonary edema, required central VA ECMO (RA drainage, ascending aorta return.  Post-op TEE EF 55%, mild-moderate RV hypokinesis.  9/14: Blender added to circuit due to high PaO2 9/16: TEE with EF 30%, moderate LVH, moderate RV dysfunction with mild enlargement, stable bioprosthetic aortic valve, repaired TV with mild TR, no endocarditis noted.  9/18: Bronchoscopy with mucus plugs and clots. 9/19: To OR for mediastinal washout.  By TEE, EF 50% with moderate LVH, mild RV enlargement and mild RV dysfunction, stable bioprosthetic aortic valve, repaired TV with mild TR.  He did not tolerate lowering of ECMO flows even with increasing of pressors due to vasoplegia.  9/21: Bronchoscopy  Remains intubated/sedated. On Central VA ECMO. Now on epinephrine 10, NE off,  and vasopressin 0.003. CVVH running even.  CXR unchanged with diffuse/severe right lung airspace disease.  CVP 8  On TPN.   Yesterday was having frequent PVCs. Now improved.   PLTs 14k overnight -> received 2packs platelets. Now 23K. No bleeding    Objective:   Weight Range: 84.7 kg Body mass index is 30.14 kg/m.   Vital Signs:   Temp:  [97.5 F (36.4 C)-97.9 F (36.6 C)] 97.7 F (36.5 C) 2023-06-13 1156) Pulse Rate:  [64-73] 68 06/13/23 1156) Resp:  [0-26] 24 2023/06/13 1156) BP: (94)/(66) 94/66 13-Jun-2023 0742) SpO2:  [98 %-100 %] 100 % 06/13/2023 1156) Arterial Line BP: (80-126)/(45-74) 115/70 06/13/2023 1145) FiO2 (%):  [50 %] 50 % Jun 13, 2023 1156) Weight:  [84.7 kg] 84.7 kg 06-13-2023 0500) Last BM Date : 05/30/23  Weight change: Filed Weights   05/29/23 0500 05/30/23 0500 06-13-23 0500  Weight: 82.7 kg 82.8 kg 84.7 kg    Intake/Output:   Intake/Output Summary (Last 24 hours) at  06/13/2023 1244 Last data filed at 2023/06/13 1100 Gross per 24 hour  Intake 4045.09 ml  Output 3457 ml  Net 588.09 ml      Physical Exam    General:  Critically-ill appearing. On vent HEENT: + ETT Neck:  + swan and HD cath Cor: Chest open ECMO cannulas and CTs ok .   Lungs: coarse R>L  Abdomen: soft, nontender, nondistended. No hepatosplenomegaly. No bruits or masses. Good bowel sounds. Extremities: no cyanosis, clubbing, rash, edema Neuro: Sedated  Telemetry   Junctional rhythm + PVCs Personally reviewed   Labs    CBC Recent Labs    05/29/23 1722 05/30/23 0002 05/30/23 1608 05/30/23 1945 13-Jun-2023 0418 06-13-23 0747  WBC 49.2*   < > 44.9*  --  44.3*  --   NEUTROABS 25.7*  --   --   --   --   --   HGB 7.9*   < > 8.6*   < > 8.3* 9.5*  HCT 24.5*   < > 26.2*   < > 25.3* 28.0*  MCV 90.7   < > 93.6  --  93.7  --   PLT 17*   < > 14*  --  23*  --    < > = values in this interval not displayed.   Basic Metabolic Panel Recent Labs    95/28/41 0418 05/30/23 0613 05/30/23 1608 05/30/23 1945 June 13, 2023 0418 2023/06/13 0747  NA 134*   < > 135   < >  135 137  K 3.7   < > 3.9   < > 3.8 3.9  CL 102  --  102  --  101  --   CO2 22  --  24  --  23  --   GLUCOSE 118*  --  141*  --  172*  --   BUN 22  --  16  --  14  --   CREATININE 1.13  --  1.20  --  1.06  --   CALCIUM 8.3*  --  8.0*  --  8.7*  --   MG 2.4  --   --   --  2.5*  --   PHOS 2.0*  --  4.0  --  2.8  --    < > = values in this interval not displayed.   Liver Function Tests Recent Labs    05/30/23 0418 05/30/23 1608 06/05/2023 0418  AST 76*  --  69*  ALT 9  --  9  ALKPHOS 99  --  99  BILITOT 3.0*  --  3.2*  PROT 6.1*  --  6.5  ALBUMIN 1.8* 1.9* 1.9*  1.9*   No results for input(s): "LIPASE", "AMYLASE" in the last 72 hours. Cardiac Enzymes No results for input(s): "CKTOTAL", "CKMB", "CKMBINDEX", "TROPONINI" in the last 72 hours.  BNP: BNP (last 3 results) Recent Labs    05/19/2023 1100  BNP 2,097.0*     ProBNP (last 3 results) No results for input(s): "PROBNP" in the last 8760 hours.   D-Dimer No results for input(s): "DDIMER" in the last 72 hours. Hemoglobin A1C No results for input(s): "HGBA1C" in the last 72 hours.  Fasting Lipid Panel Recent Labs    05/30/23 0418  TRIG 122   Thyroid Function Tests No results for input(s): "TSH", "T4TOTAL", "T3FREE", "THYROIDAB" in the last 72 hours.  Invalid input(s): "FREET3"  Other results:   Imaging    DG CHEST PORT 1 VIEW  Result Date: 05/24/2023 CLINICAL DATA:  Provided history: Personal history of ECMO. EXAM: PORTABLE CHEST 1 VIEW COMPARISON:  Chest radiographs 05/30/2023 and earlier. FINDINGS: ET tube present with tip 3.5 cm above the level of the carina. Mediastinal and bilateral chest tubes. ECMO cannulation support apparatus. An enteric tube is terminates at the expected level of the gastric fundus/body (with side port at the level of the distal esophagus/GE junction). A second enteric tube passes below the level of left hemidiaphragm and terminates outside of the field of view. Right-sided central venous catheter with tip at the level of the superior cavoatrial junction. Left IJ approach central venous catheter terminating in the region of the main pulmonary artery. The cardiomediastinal silhouette is unchanged. Persistent dense airspace disease throughout much of the right lung. No evidence of pleural effusion or pneumothorax. IMPRESSION: 1. Support apparatus as described. 2. Persistent dense airspace disease throughout much of the right lung, similar to the prior examination of 05/30/2023. Electronically Signed   By: Jackey Loge D.O.   On: 05/09/2023 11:06     Medications:     Scheduled Medications:  sodium chloride   Intravenous Once   sodium chloride   Intravenous Once   sodium chloride   Intravenous Once   sodium chloride   Intravenous Once   sodium chloride   Intravenous Once   sodium chloride   Intravenous Once    atorvastatin  80 mg Per Tube q1800   bisacodyl  10 mg Oral Daily   Or   bisacodyl  10  mg Rectal Daily   Chlorhexidine Gluconate Cloth  6 each Topical Daily   docusate  100 mg Per Tube BID   ezetimibe  10 mg Per Tube Daily   insulin aspart  0-15 Units Subcutaneous Q4H   metoCLOPramide (REGLAN) injection  5 mg Intravenous Q6H   mouth rinse  15 mL Mouth Rinse Q2H   pantoprazole (PROTONIX) IV  40 mg Intravenous QHS   sodium chloride flush  3 mL Intravenous Q12H    Infusions:   prismasol BGK 4/2.5 400 mL/hr at 05/30/23 1715    prismasol BGK 4/2.5 400 mL/hr at 05/30/23 1715   sodium chloride Stopped (05/24/23 0032)   sodium chloride Stopped (05/22/23 1310)   sodium chloride 10 mL/hr at 05/12/2023 1100   albumin human Stopped (05/30/23 0819)   anticoagulant sodium citrate     dexmedetomidine (PRECEDEX) IV infusion 0.7 mcg/kg/hr (05/27/2023 1140)   epinephrine 4 mcg/min (05/29/2023 1100)   HYDROmorphone 3 mg/hr (05/09/2023 1100)   insulin Stopped (05/21/23 0754)   lactated ringers Stopped (05/27/23 2100)   meropenem (MERREM) IV Stopped (05/15/2023 0552)   micafungin (MYCAMINE) 200 mg in sodium chloride 0.9 % 100 mL IVPB Stopped (05/27/2023 0909)   midazolam 6 mg/hr (05/14/2023 1106)   norepinephrine (LEVOPHED) Adult infusion 4 mcg/min (05/13/2023 1100)   prismasol BGK 4/2.5 1,500 mL/hr at 05/25/2023 0914   TPN ADULT (ION) 40 mL/hr at 05/28/2023 1100   TPN ADULT (ION)     vancomycin Stopped (05/30/23 2313)   vasopressin 0.05 Units/min (05/15/2023 1100)    PRN Medications: sodium chloride, sodium chloride, albumin human, anticoagulant sodium citrate, artificial tears, dextrose, HYDROmorphone, midazolam, midazolam, morphine injection, ondansetron (ZOFRAN) IV, mouth rinse, oxyCODONE, sodium chloride flush, traMADol    Assessment/Plan   1. Post-cardiotomy vasoplegia/shock with failure to wean from CPB: Now on central VA ECMO (05/16/2023) with RA drainage/ascending aorta return. Post-op echo with EF 55%,  mild-moderate RV dysfunction. Unable to get images yesterday by TTE.  TEE on 9/16 showed EF 30%, moderate LVH, moderate RV dysfunction with mild enlargement, stable bioprosthetic aortic valve, repaired TV with mild TR, no endocarditis noted.  To OR for mediastinal washout on 9/19.  By TEE 9/19, EF 50% with moderate LVH, mild RV enlargement and mild RV dysfunction, stable bioprosthetic aortic valve, repaired TV with mild TR.  He did not tolerate lowering of ECMO flows even with increasing of pressors due to vasoplegia. CXR with clearing left lung, right lung with persistent airspace disease not clearing with CVVH.  Impella 5.5 considered in OR, but does not have access for this and with improved LV function not sure it would be helpful. ECMO flow now at 2.5 L/min. CVVH now running even. Today, he is on epinephrine 8, vasopressin 0.08 Off NE .  CVP 58  - We increases epi and VP and added back NE and we were able to wean VA ECMO to 1.5L with stable hemodynamics. Will plan VA decannulation today in OR with transition to VV - Will continue/adjust current pressor support as need - Continue midodrine to 10 tid.  - Will run CVVH even, aim for CVP 8-12 for better hemodynamics.   - D/c bival for OR. Give more PLTs in OR 2. Acute hypoxic respiratory failure, post-op: On vent, rest settings. Blender added to keep PaO2 down. Sweep remains 4.5. CXR with some clearing of left lung, right lung with ongoing diffuse airspace disease without change.  Suspect extensive PNA, think we have done what we can with CVVH for lung clearing  at this point. Bronchoscopy yesterday with no change in CXR.  -  Plan as above with switch to VV ECMO today - Meropenem/vancomycin/micafungin for coverage of PNA 3. Severe prosthetic AI: Due to previous MSSA endocarditis with partial valve dehiscence s/p re-do AVR/Bentall with reimplantation of SVG-RCA and TV repair 05/15/2023.   - Continuing meropenem/vancomycin/micafungin  4. CAD s/p CABG/AVR 2017:   s/p AVR/Bentall and re-implantation of SVG-RCA 06/03/2023. - Off ASA with platelets down.  5. ESRD: CVVH as above. Keep mildly positive 6. DM2: management per CCM 7. Heart block: Post-op, now in stable junctional rhythm.   8. H/o CVA: Prior cerebellar CVA.  9. Thrombocytopenia: Post-op, 43 => 38 => 28 => 20 => 24 K => 19 => 20K -> 23K today.  Suspect inflammatory and hemolysis.  - will need several packs of platelets prior to OR today d/w Dr. Laneta Simmers  10. Anemia: Post-op, transfuse hgb < 8. Hgb 8.3 11. ID: H/o MSSA bacteremia.  WBCs 54 => 53 => 45 => 48.7 => 48K => 42 => 53K => 45K now.  - Meropenem + vancomycin.  - Micafungin for fungal coverage added 9/20 12. FEN: TFs restarted slowly via [place].  Suspect aspiration last week.    CRITICAL CARE Performed by: Arvilla Meres  Total critical care time: 60 minutes  Critical care time was exclusive of separately billable procedures and treating other patients.  Critical care was necessary to treat or prevent imminent or life-threatening deterioration.  Critical care was time spent personally by me on the following activities: development of treatment plan with patient and/or surrogate as well as nursing, discussions with consultants, evaluation of patient's response to treatment, examination of patient, obtaining history from patient or surrogate, ordering and performing treatments and interventions, ordering and review of laboratory studies, ordering and review of radiographic studies, pulse oximetry and re-evaluation of patient's condition.    Length of Stay: 13  Arvilla Meres, MD  2023/06/18, 12:44 PM  Advanced Heart Failure Team Pager [phone] (M-F; 7a - 5p)  Please contact [facility] for night-coverage after hours (5p -7a ) and weekends on [email or web address]

## 2023-05-31 NOTE — Progress Notes (Signed)
   Patient returned from OR on VV ECMO  On return had significant skin oozing but CTs remained dry.   Hemodynamics stable on current gtts  VV ECMO settings based on ABG.   PLTS now > 50K. Fibrinogen > 500. Coags pending.   Discussed with Dr. Laneta Simmers. Will continue to hold pressure on skin incision.   Support with products as needed.   Additional CCT 45 minutes.   Arvilla Meres, MD  6:51 PM

## 2023-05-31 NOTE — Progress Notes (Signed)
Patient ID: [name], male   DOB: December 04, 1960, 62 y.o.   MRN: [id number]   Extracorporeal support note   ECLS support day: 1 Indication: Acute hypoxic respiratory failure  Configuration: Central VV ECMO  30FR Dual-lumen Crescent cannula in RIJ   Pump speed: 3285 rpm  Pump flow: 3.5 L/min Pump used: Cardiohelp  Sweep gas: 4   Circuit check: Small clot at 12p Anticoagulant: None for now due to post-op bleeding Anticoagulation targets:   Changes in support: none  Anticipated goals/duration of support: Wean off VV ECMO as tolerated  .Arvilla Meres MD  05/31/2023, 6:45 PM

## 2023-05-31 NOTE — Progress Notes (Signed)
Patient ID: [name], male   DOB: 1960/10/10, 62 y.o.   MRN: [id number] S:No events overnight.  Pt was seen and examined will on CRRT and reviewed labs and orders and adjustments made accordingly. O:BP 94/66   Pulse 68   Temp 97.7 F (36.5 C)   Resp (!) 24   Ht 5\' 6"  (1.676 m)   Wt 84.7 kg   SpO2 100%   BMI 30.14 kg/m   Intake/Output Summary (Last 24 hours) at 06/05/2023 0857 Last data filed at 05/19/2023 0800 Gross per 24 hour  Intake 4220.33 ml  Output 4462 ml  Net -241.67 ml   Intake/Output: I/O last 3 completed shifts: In: 6411.3 [I.V.:3632.4; Blood:905; Other:55; NG/GT:345; IV Piggyback:1473.9] Out: 6310 [Emesis/NG output:1250; Stool:370; Chest Tube:40]  Intake/Output this shift:  Total I/O In: 114.6 [I.V.:114.6] Out: 109  Weight change: 1.9 kg NFA:OZHYQMVHQ and sedated CVS: RRR Resp: ventilated BS bilaterally Abd:+BS, soft, NT/ND Ext: no edema  Recent Labs  Lab 05/25/23 0412 05/25/23 0421 06/01/2023 0404 05/24/2023 0411 05/27/23 0307 05/27/23 0315 05/28/23 0411 05/28/23 0416 05/28/23 1558 05/28/23 1600 05/29/23 0407 05/29/23 0751 05/29/23 1609 05/29/23 1612 05/30/23 0418 05/30/23 4696 05/30/23 1208 05/30/23 1608 05/30/23 1945 05/27/2023 0417 06/01/2023 0418  NA 138   < > 135   < > 136   < > 136   < > 135   < > 134*   < > 135   < > 134* 137 136 135 136 135 135  K 3.4*   < > 4.0   < > 4.3   < > 4.1   < > 3.6   < > 3.7   < > 3.9   < > 3.7 3.8 4.1 3.9 4.2 3.9 3.8  CL 105   < > 104   < > 99   < > 103  --  103  --  99  --  104  --  102  --   --  102  --   --  101  CO2 23   < > 22   < > 23   < > 23  --  24  --  21*  --  24  --  22  --   --  24  --   --  23  GLUCOSE 225*   < > 118*   < > 107*   < > 142*  --  152*  --  163*  --  135*  --  118*  --   --  141*  --   --  172*  BUN 24*   < > 21   < > 17   < > 16  --  20  --  29*  --  30*  --  22  --   --  16  --   --  14  CREATININE 1.19   < > 1.14   < > 1.20   < > 1.13  --  1.14  --  1.11  --  1.12  --  1.13  --   --   1.20  --   --  1.06  ALBUMIN 2.2*  2.2*   < > 2.3*  2.3*   < > 2.2*  2.2*   < > 2.1*  --  2.0*  --  1.9*  2.0*  --  1.9*  --  1.8*  --   --  1.9*  --   --  1.9*  1.9*  CALCIUM 8.1*   < >  8.2*   < > 8.2*   < > 8.4*  --  8.4*  --  8.7*  --  7.9*  --  8.3*  --   --  8.0*  --   --  8.7*  PHOS <1.0*   < > 1.7*   < > 2.2*   < > 1.8*  --  1.4*  --  2.1*  --  2.2*  --  2.0*  --   --  4.0  --   --  2.8  AST 48*  --  64*  --  68*  --  60*  --   --   --  71*  --   --   --  76*  --   --   --   --   --  69*  ALT 9  --  12  --  11  --  9  --   --   --  8  --   --   --  9  --   --   --   --   --  9   < > = values in this interval not displayed.   Liver Function Tests: Recent Labs  Lab 05/29/23 0407 05/29/23 1609 05/30/23 0418 05/30/23 1608 05/12/2023 0418  AST 71*  --  76*  --  69*  ALT 8  --  9  --  9  ALKPHOS 100  --  99  --  99  BILITOT 2.3*  --  3.0*  --  3.2*  PROT 6.3*  --  6.1*  --  6.5  ALBUMIN 1.9*  2.0*   < > 1.8* 1.9* 1.9*  1.9*   < > = values in this interval not displayed.   No results for input(s): "LIPASE", "AMYLASE" in the last 168 hours. No results for input(s): "AMMONIA" in the last 168 hours. CBC: Recent Labs  Lab 05/29/23 1609 05/29/23 1612 05/29/23 1722 05/30/23 0002 05/30/23 0418 05/30/23 0613 05/30/23 1608 05/30/23 1945 05/29/2023 0417 05/18/2023 0418  WBC 49.9*  --  49.2*  --  45.6*  --  44.9*  --   --  44.3*  NEUTROABS  --   --  25.7*  --   --   --   --   --   --   --   HGB 7.8*   < > 7.9*   < > 8.9*   < > 8.6* 8.8* 8.8* 8.3*  HCT 24.4*   < > 24.5*   < > 27.2*   < > 26.2* 26.0* 26.0* 25.3*  MCV 91.0  --  90.7  --  92.5  --  93.6  --   --  93.7  PLT 17*  --  17*  --  14*  --  14*  --   --  23*   < > = values in this interval not displayed.   Cardiac Enzymes: No results for input(s): "CKTOTAL", "CKMB", "CKMBINDEX", "TROPONINI" in the last 168 hours. CBG: Recent Labs  Lab 05/30/23 1205 05/30/23 1613 05/30/23 1943 05/30/23 2357 05/14/2023 0415  GLUCAP  143* 144* 150* 184* 171*    Iron Studies: No results for input(s): "IRON", "TIBC", "TRANSFERRIN", "FERRITIN" in the last 72 hours. Studies/Results: DG CHEST PORT 1 VIEW  Result Date: 05/30/2023 CLINICAL DATA:  ECMO EXAM: PORTABLE CHEST 1 VIEW COMPARISON:  Some view chest x-ray 05/29/2023 FINDINGS: The patient is intubated. Mediastinal drain and bilateral chest tubes are in place. ECMO catheters stable. Dense  consolidation laterally in the right lung is again noted. Edema on the left is stable. IMPRESSION: 1. Stable support apparatus. 2. Stable dense consolidation laterally in the right lung. Electronically Signed   By: Marin Roberts M.D.   On: 05/30/2023 07:44   DG Abd 1 View  Result Date: 05/29/2023 CLINICAL DATA:  [id number] Encounter for orogastric (OG) tube placement [id number] EXAM: ABDOMEN - 1 VIEW COMPARISON:  05/27/2023 FINDINGS: Limited radiograph of the lower chest and upper abdomen was obtained for the purposes of enteric tube localization. Small bore enteric tube is seen coursing below the diaphragm with distal tip terminating within the expected location of the proximal gastric body. Additional large bore feeding tube terminates within the distal stomach. Partially imaged ECMO cannulas, left basilar chest tube. Right basilar consolidation. IMPRESSION: 1. Small bore enteric tube terminates within the expected location of the proximal gastric body. 2. Additional large bore feeding tube terminates within the distal stomach. Electronically Signed   By: Duanne Guess D.O.   On: 05/29/2023 15:37    sodium chloride   Intravenous Once   sodium chloride   Intravenous Once   sodium chloride   Intravenous Once   sodium chloride   Intravenous Once   sodium chloride   Intravenous Once   atorvastatin  80 mg Per Tube q1800   bisacodyl  10 mg Oral Daily   Or   bisacodyl  10 mg Rectal Daily   Chlorhexidine Gluconate Cloth  6 each Topical Daily   docusate  100 mg Per Tube BID   ezetimibe  10 mg  Per Tube Daily   insulin aspart  0-15 Units Subcutaneous Q4H   metoCLOPramide (REGLAN) injection  5 mg Intravenous Q6H   neostigmine  0.25 mg Subcutaneous Q6H   mouth rinse  15 mL Mouth Rinse Q2H   pantoprazole (PROTONIX) IV  40 mg Intravenous QHS   sodium chloride flush  3 mL Intravenous Q12H    BMET    Component Value Date/Time   NA 135 06/28/2023 0418   NA 143 11/02/2022 1454   K 3.8 June 28, 2023 0418   CL 101 Jun 28, 2023 0418   CO2 23 2023/06/28 0418   GLUCOSE 172 (H) 06-28-23 0418   BUN 14 2023-06-28 0418   BUN 31 (H) 11/02/2022 1454   CREATININE 1.06 28-Jun-2023 0418   CREATININE 2.71 (H) 05/23/2023 0950   CREATININE 1.05 02/24/2015 1601   CALCIUM 8.7 (L) 06/28/23 0418   GFRNONAA >60 2023/06/28 0418   GFRNONAA 26 (L) 06/05/2023 0950   GFRNONAA 80 02/24/2015 1601   GFRAA 62 10/09/2020 1520   GFRAA >89 02/24/2015 1601   CBC    Component Value Date/Time   WBC 44.3 (H) 06-28-23 0418   RBC 2.70 (L) 28-Jun-2023 0418   HGB 8.3 (L) 06/28/23 0418   HGB 6.4 (LL) 05/11/2023 0852   HGB 10.2 (L) 08/12/2022 0934   HCT 25.3 (L) 06/28/2023 0418   HCT 29.2 (L) 08/12/2022 0934   PLT 23 (LL) 06-28-23 0418   PLT 109 (L) 05/13/2023 0852   PLT 252 08/12/2022 0934   MCV 93.7 Jun 28, 2023 0418   MCV 94 08/12/2022 0934   MCH 30.7 2023-06-28 0418   MCHC 32.8 06-28-2023 0418   RDW 18.9 (H) 28-Jun-2023 0418   RDW 15.5 (H) 08/12/2022 0934   LYMPHSABS 5.8 (H) 05/29/2023 1722   LYMPHSABS 1.9 11/04/2021 1028   MONOABS 6.3 (H) 05/29/2023 1722   EOSABS 0.0 05/29/2023 1722   EOSABS 0.2 11/04/2021 1028   BASOSABS 0.0  05/29/2023 1722   BASOSABS 0.1 11/04/2021 1028    Assessment/Plan:   Anuric AKI/dialysis dependent AKI with fluid overload: Started on RRT during previous admission in May 2024 until 05/10/23 (recovered). Patient is status post cardiac surgery on 9/13 and currently on ECMO. CRRT start 9/11. He remains anuric with fluid overload.  Continue to run CRRT at current prescription  (resume post-op).  UF as tolerated by BP and ECMO, Angiomax for anticoagulation.  UFG: net even. Discussed with ICU RN at bedside. CRRT ordrers:  all fluids 4K/2.5Ca; dialysate 1500 mL/hr, pre- and post- filter at 400 mL/hr, sodium citrate for anticoagulation and UF goal is to keep even today prior to surgery then will likely need to UF 50 mL/hr after volume expanded.  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  Per CTS AHRF: VDRF, per primary service. Abx/antifungal per primary service Postcardiotomy vasoplegia/shock with failure to wean: Currently on ECMO per HF team.  On pressors per primary service.  Volume managing with CRRT. S/p washout 9/19. Started on midodrine. Hyperkalemia: Managed with CRRT prescription and follow labs. K WNL Anemia, thrombocytopenia: Transfuse as needed per primary team. Hypophosphatemia - will replete and follow.   Irena Cords, MD [facility]

## 2023-05-31 NOTE — Anesthesia Postprocedure Evaluation (Signed)
Anesthesia Post Note  Patient: [name]  Procedure(s) Performed: MEDIASTINAL WASHOUT (Chest) INTRAOPERATIVE TRANSESOPHAGEAL ECHOCARDIOGRAM (Esophagus)     Patient location during evaluation: SICU Anesthesia Type: General Level of consciousness: sedated Pain management: pain level controlled Vital Signs Assessment: post-procedure vital signs reviewed and stable Respiratory status: patient remains intubated per anesthesia plan Cardiovascular status: stable Postop Assessment: no apparent nausea or vomiting Anesthetic complications: no   No notable events documented.  Last Vitals:  Vitals:   05/31/23 1230 05/31/23 1245  BP:    Pulse: 66 64  Resp: (!) 24 17  Temp: (!) 36.4 C   SpO2: 100% 100%    Last Pain:  Vitals:   05/31/23 1200  TempSrc: Core  PainSc:                  [name] S

## 2023-05-31 NOTE — Progress Notes (Signed)
PHARMACY - TOTAL PARENTERAL NUTRITION CONSULT NOTE   Indication:  unable to tolerate tube feeding  Patient Measurements: Height: 5\' 6"  (167.6 cm) Weight: 84.7 kg (186 lb 11.7 oz) IBW/kg (Calculated) : 63.8 TPN AdjBW (KG): 69 Body mass index is 30.14 kg/m. Usual Weight: ~90kg  Assessment: 62 yo male with significant PMH for HLD, HTN, T2DM, CKD4 (prev on HD) and recent MSSA bacteremia + endocarditis (May 2024). Back in May 2024 admission, underwent ERCP for cholangitis/pancreatitis. Admitted on 9/11 w/ chest pain found to have aortic valve dehiscence. S/p aortic valve replacement redo on 9/13 - developed pulmonary edema post op and was placed on VA ECMO. S/p mediastinal washout on 9/19 - chest still open. Unable to speak w/ patient due to intubation (utilizing Precedex and Versed drips for sedation). Pharmacy consulted to manage TPN for inability to tolerate tube feeding and malnutrition.   Glucose / Insulin: hx T2DM - A1c 6.1%, on Farxiga PTA. Previously on insulin gtt post CT surgery >> transitioned to mSSI q4h. 12 units SSI used/24 hours. CBGs 143-184 Electrolytes: K 3.8, Na 135, Phos 2.8 (s/p NaPhos x1), Mg 2.5, CoCa 10.4 Renal: Hx CKD4 currently on CRRT (required HD in the past), using 4K bags, bivalirudin for anticoag - not using systemic sodium citrate infusion, only for catheter lock per d/w RN at bedside. CRRT 9/11 >>  Hepatic: Alk phos 99, AST 69 (improved)/ALT 9, Tbili 3.2, albumin 1.9, TG 122 Intake / Output: 1.25L OG output, Trickle TF @ 20 mL/hr stopped 9/22 PM due to inc OG output and no bowel sounds. Relistor x1 given 9/23, receiving Reglan 5mg  IV q6h - Chest tube output 40mL, stool output 335 mL - NGT placed 9/22, Cortrak placed 9/20  MIVF: LR and NS @ KVO  GI Imaging:  - None since TPN start  GI Surgeries / Procedures:  - None since TPN start - planning transition to VV ECMO on 9/24  Central access: 9/13 TPN start date: 9/23  Nutritional Goals: Goal  concentrated TPN rate is 75 mL/hr (provides 131 g of protein and 2250 kcals per day)  RD Assessment: Estimated Needs Total Energy Estimated Needs: 2200-2400 Total Protein Estimated Needs: 130-150 grams Total Fluid Estimated Needs: 1L plus UOP  Current Nutrition:  NPO and TPN to start 9/23  Plan:  Increase concentrated TPN to 75 mL/hr at 1800 - will provide 2250 kcal and 131 g protein meeting ~100% of needs Electrolytes in TPN: Na 76mEq/L, K 26mEq/L, Ca 0 mEq/L, Mg 35mEq/L, and Phos to 17 mmol/L (overall amount inc w/ TPN rate inc). Cl:Ac 1:1 D/c per tube MVI and begin standard MVI and trace elements within TPN No chromium (on CRRT and currently on shortage) Add thiamine to TPN x 5 days for refeeding risk (9/23 >> 9/27) Continue Moderate q4h SSI and adjust as needed  Continue M[facility]F at [facility] Monitor TPN labs on Mon/Thurs, RFP BID while on CRRT  Rexford Maus, PharmD, BCPS 05/31/2023 7:44 AM

## 2023-05-31 NOTE — Progress Notes (Signed)
Overnight ECMO updates:  Around 2140 bedside RN reached out to Dr Laneta Simmers concerning increased pressors needs, continued oozing from chest closure site, and drop in hemoglobin. Lactic acid and TEG results also give to MD. Verbal order to give 2 PRBC and 2 FFP. Manual pressure held to site also. Dr Gala Romney also made aware of ABG results. Order to give 2 amps of Bicarb and to start Bicarb drip. Recheck lactic acid in 1 hr.  ECMO sweep adjusted per protocol.  0000: 2 more amps of Bicarb given per Dr Gala Romney. Also reached out to on call Renal MD concerning CRRT adjustments. Bicarb added to to CRRT. PIV Bicarb to be stopped when CRRT Bicarb starts. Bedside RN aware of plans.

## 2023-05-31 NOTE — Transfer of Care (Signed)
Immediate Anesthesia Transfer of Care Note  Patient: [name]  Procedure(s) Performed: CONVERSION TO VV ECMO (EXTRACORPOREAL MEMBRANE OXYGENATION) DECANNULATION FOR ECMO TRANSESOPHAGEAL ECHOCARDIOGRAM  Patient Location: PACU  Anesthesia Type:General  Level of Consciousness: Patient remains intubated per anesthesia plan  Airway & Oxygen Therapy: Patient placed on Ventilator (see vital sign flow sheet for setting)  Post-op Assessment: Report given to RN and Post -op Vital signs reviewed and stable  Post vital signs: Reviewed and stable  Last Vitals:  Vitals Value Taken Time  BP 110/56   Temp 36.2 C 05/31/23 1713  Pulse 67 05/31/23 1713  Resp 0 05/31/23 1713  SpO2 100 % 05/31/23 1713  Vitals shown include unfiled device data.  Last Pain:  Vitals:   05/31/23 1200  TempSrc: Core  PainSc:          Complications: No notable events documented.

## 2023-05-31 NOTE — Anesthesia Preprocedure Evaluation (Addendum)
Anesthesia Evaluation  Patient identified by MRN, date of birth, ID band Patient unresponsive  General Assessment Comment:Pt sedated, ventilated, ECMO support  Reviewed: Allergy & Precautions, NPO status , Patient's Chart, lab work & pertinent test results, Unable to perform ROS - Chart review only  History of Anesthesia Complications Negative for: history of anesthetic complications  Airway Mallampati: Intubated  TM Distance: >3 FB     Dental  (+) Poor Dentition, Missing   Pulmonary sleep apnea , COPD, Patient abstained from smoking. Intubated, ventilated, and sedated   breath sounds clear to auscultation       Cardiovascular hypertension, Pt. on medications and Pt. on home beta blockers + CAD, + Past MI, + CABG and +CHF  + Valvular Problems/Murmurs (s/p Tricuspid repair and aortic valve replacement)  Rhythm:Regular Rate:Normal  Complicated AVR with tricuspid repair, post pump vasoplegia requiring VA ECMO Drainage cannula: RA Return cannula: Ascending aorta Weaning and transitioning to VV ECMO  On epinephrine 10, NE 5,  and vasopressin 0.04   05/23/2023 ECHO: EF 30%.  1. The LV has moderate to severely decreased function, global hypokinesis. There is moderate left ventricular hypertrophy.   2. Right ventricular systolic function is moderately reduced. The right ventricular size is mildly enlarged. Moderately increased right ventricular wall thickness.   3. The tricuspid valve is has been repaired. There is mild tricuspid regurgitation.   4. Bioprosthetic aortic valve. Aortic valve regurgitation is trivial. No aortic stenosis is present.   5. The mitral valve is normal in structure. Trivial mitral valve regurgitation. No evidence of mitral stenosis.     Neuro/Psych   Anxiety     TIACVA    GI/Hepatic Neg liver ROS,,,Has feeding tube   Endo/Other  diabetes (glu 153)  BMI 30  Renal/GU ARFRenal diseaseCVVH      Musculoskeletal   Abdominal   Peds  Hematology Hb 8.3, plt 23k   Anesthesia Other Findings   Reproductive/Obstetrics                             Anesthesia Physical Anesthesia Plan  ASA: 5  Anesthesia Plan: General   Post-op Pain Management: Minimal or no pain anticipated   Induction: Intravenous  PONV Risk Score and Plan: 2 and Treatment may vary due to age or medical condition  Airway Management Planned: Oral ETT  Additional Equipment: Arterial line, PA Cath and TEE  Intra-op Plan:   Post-operative Plan: Post-operative intubation/ventilation  Informed Consent:      History available from chart only  Plan Discussed with: Surgeon and CRNA  Anesthesia Plan Comments: (Pt intubated, ventilated, and sedated, consent by Drs. Bensimhon and Bartle)        Anesthesia Quick Evaluation

## 2023-06-01 ENCOUNTER — Inpatient Hospital Stay (HOSPITAL_COMMUNITY): Payer: PPO

## 2023-06-01 ENCOUNTER — Encounter (HOSPITAL_COMMUNITY): Payer: Self-pay | Admitting: Surgery

## 2023-06-01 DIAGNOSIS — R570 Cardiogenic shock: Secondary | ICD-10-CM | POA: Diagnosis not present

## 2023-06-01 DIAGNOSIS — T8111XA Postprocedural  cardiogenic shock, initial encounter: Secondary | ICD-10-CM | POA: Diagnosis not present

## 2023-06-01 DIAGNOSIS — J9601 Acute respiratory failure with hypoxia: Secondary | ICD-10-CM | POA: Diagnosis not present

## 2023-06-01 LAB — POCT I-STAT 7, (LYTES, BLD GAS, ICA,H+H)
Acid-Base Excess: 0 mmol/L (ref 0.0–2.0)
Acid-Base Excess: 4 mmol/L — ABNORMAL HIGH (ref 0.0–2.0)
Acid-Base Excess: 3 mmol/L — ABNORMAL HIGH (ref 0.0–2.0)
Acid-Base Excess: 4 mmol/L — ABNORMAL HIGH (ref 0.0–2.0)
Acid-Base Excess: 6 mmol/L — ABNORMAL HIGH (ref 0.0–2.0)
Acid-Base Excess: 6 mmol/L — ABNORMAL HIGH (ref 0.0–2.0)
Acid-base deficit: 7 mmol/L — ABNORMAL HIGH (ref 0.0–2.0)
Acid-base deficit: 3 mmol/L — ABNORMAL HIGH (ref 0.0–2.0)
Bicarbonate: 17.8 mmol/L — ABNORMAL LOW (ref 20.0–28.0)
Bicarbonate: 21 mmol/L (ref 20.0–28.0)
Bicarbonate: 23.6 mmol/L (ref 20.0–28.0)
Bicarbonate: 27.6 mmol/L (ref 20.0–28.0)
Bicarbonate: 30.4 mmol/L — ABNORMAL HIGH (ref 20.0–28.0)
Bicarbonate: 30.7 mmol/L — ABNORMAL HIGH (ref 20.0–28.0)
Bicarbonate: 31.1 mmol/L — ABNORMAL HIGH (ref 20.0–28.0)
Bicarbonate: 32.2 mmol/L — ABNORMAL HIGH (ref 20.0–28.0)
Calcium, Ion: 1.03 mmol/L — ABNORMAL LOW (ref 1.15–1.40)
Calcium, Ion: 1.07 mmol/L — ABNORMAL LOW (ref 1.15–1.40)
Calcium, Ion: 1.06 mmol/L — ABNORMAL LOW (ref 1.15–1.40)
Calcium, Ion: 1.07 mmol/L — ABNORMAL LOW (ref 1.15–1.40)
Calcium, Ion: 1.11 mmol/L — ABNORMAL LOW (ref 1.15–1.40)
Calcium, Ion: 1.12 mmol/L — ABNORMAL LOW (ref 1.15–1.40)
Calcium, Ion: 1.1 mmol/L — ABNORMAL LOW (ref 1.15–1.40)
Calcium, Ion: 1.09 mmol/L — ABNORMAL LOW (ref 1.15–1.40)
HCT: 26 % — ABNORMAL LOW (ref 39.0–52.0)
HCT: 27 % — ABNORMAL LOW (ref 39.0–52.0)
HCT: 27 % — ABNORMAL LOW (ref 39.0–52.0)
HCT: 28 % — ABNORMAL LOW (ref 39.0–52.0)
HCT: 28 % — ABNORMAL LOW (ref 39.0–52.0)
HCT: 28 % — ABNORMAL LOW (ref 39.0–52.0)
HCT: 28 % — ABNORMAL LOW (ref 39.0–52.0)
HCT: 36 % — ABNORMAL LOW (ref 39.0–52.0)
Hemoglobin: 8.8 g/dL — ABNORMAL LOW (ref 13.0–17.0)
Hemoglobin: 9.2 g/dL — ABNORMAL LOW (ref 13.0–17.0)
Hemoglobin: 9.2 g/dL — ABNORMAL LOW (ref 13.0–17.0)
Hemoglobin: 9.5 g/dL — ABNORMAL LOW (ref 13.0–17.0)
Hemoglobin: 9.5 g/dL — ABNORMAL LOW (ref 13.0–17.0)
Hemoglobin: 9.5 g/dL — ABNORMAL LOW (ref 13.0–17.0)
Hemoglobin: 9.5 g/dL — ABNORMAL LOW (ref 13.0–17.0)
Hemoglobin: 12.2 g/dL — ABNORMAL LOW (ref 13.0–17.0)
O2 Saturation: 99 %
O2 Saturation: 99 %
O2 Saturation: 100 %
O2 Saturation: 100 %
O2 Saturation: 99 %
O2 Saturation: 99 %
O2 Saturation: 100 %
O2 Saturation: 99 %
Patient temperature: 36.5
Patient temperature: 36.5
Patient temperature: 36.6
Patient temperature: 36.7
Patient temperature: 36.7
Patient temperature: 36.7
Patient temperature: 36.6
Potassium: 4.2 mmol/L (ref 3.5–5.1)
Potassium: 4.2 mmol/L (ref 3.5–5.1)
Potassium: 4 mmol/L (ref 3.5–5.1)
Potassium: 4 mmol/L (ref 3.5–5.1)
Potassium: 3.8 mmol/L (ref 3.5–5.1)
Potassium: 3.8 mmol/L (ref 3.5–5.1)
Potassium: 4 mmol/L (ref 3.5–5.1)
Potassium: 3.8 mmol/L (ref 3.5–5.1)
Sodium: 138 mmol/L (ref 135–145)
Sodium: 138 mmol/L (ref 135–145)
Sodium: 137 mmol/L (ref 135–145)
Sodium: 138 mmol/L (ref 135–145)
Sodium: 138 mmol/L (ref 135–145)
Sodium: 137 mmol/L (ref 135–145)
Sodium: 137 mmol/L (ref 135–145)
Sodium: 137 mmol/L (ref 135–145)
TCO2: 19 mmol/L — ABNORMAL LOW (ref 22–32)
TCO2: 22 mmol/L (ref 22–32)
TCO2: 24 mmol/L (ref 22–32)
TCO2: 29 mmol/L (ref 22–32)
TCO2: 32 mmol/L (ref 22–32)
TCO2: 32 mmol/L (ref 22–32)
TCO2: 33 mmol/L — ABNORMAL HIGH (ref 22–32)
TCO2: 34 mmol/L — ABNORMAL HIGH (ref 22–32)
pCO2 arterial: 30.4 mmHg — ABNORMAL LOW (ref 32–48)
pCO2 arterial: 31.7 mmHg — ABNORMAL LOW (ref 32–48)
pCO2 arterial: 31.5 mmHg — ABNORMAL LOW (ref 32–48)
pCO2 arterial: 34.4 mmHg (ref 32–48)
pCO2 arterial: 58.2 mmHg — ABNORMAL HIGH (ref 32–48)
pCO2 arterial: 55.3 mmHg — ABNORMAL HIGH (ref 32–48)
pCO2 arterial: 48.4 mmHg — ABNORMAL HIGH (ref 32–48)
pCO2 arterial: 50.5 mmHg — ABNORMAL HIGH (ref 32–48)
pH, Arterial: 7.372 (ref 7.35–7.45)
pH, Arterial: 7.426 (ref 7.35–7.45)
pH, Arterial: 7.482 — ABNORMAL HIGH (ref 7.35–7.45)
pH, Arterial: 7.511 — ABNORMAL HIGH (ref 7.35–7.45)
pH, Arterial: 7.324 — ABNORMAL LOW (ref 7.35–7.45)
pH, Arterial: 7.351 (ref 7.35–7.45)
pH, Arterial: 7.414 (ref 7.35–7.45)
pH, Arterial: 7.412 (ref 7.35–7.45)
pO2, Arterial: 165 mmHg — ABNORMAL HIGH (ref 83–108)
pO2, Arterial: 149 mmHg — ABNORMAL HIGH (ref 83–108)
pO2, Arterial: 180 mmHg — ABNORMAL HIGH (ref 83–108)
pO2, Arterial: 176 mmHg — ABNORMAL HIGH (ref 83–108)
pO2, Arterial: 133 mmHg — ABNORMAL HIGH (ref 83–108)
pO2, Arterial: 149 mmHg — ABNORMAL HIGH (ref 83–108)
pO2, Arterial: 175 mmHg — ABNORMAL HIGH (ref 83–108)
pO2, Arterial: 167 mmHg — ABNORMAL HIGH (ref 83–108)

## 2023-06-01 LAB — BPAM PLATELET PHERESIS
Blood Product Expiration Date: 202409262359
Blood Product Expiration Date: 202409262359
Blood Product Expiration Date: 202409272359
Blood Product Expiration Date: 202409272359
Blood Product Expiration Date: 202409272359
ISSUE DATE / TIME: 202409241235
ISSUE DATE / TIME: 202409241235
ISSUE DATE / TIME: 202409241630
ISSUE DATE / TIME: 202409241811
ISSUE DATE / TIME: 202409241811
Unit Type and Rh: 5100
Unit Type and Rh: 6200
Unit Type and Rh: 6200
Unit Type and Rh: 5100
Unit Type and Rh: 6200

## 2023-06-01 LAB — RENAL FUNCTION PANEL
Albumin: 2 g/dL — ABNORMAL LOW (ref 3.5–5.0)
Albumin: 2 g/dL — ABNORMAL LOW (ref 3.5–5.0)
Anion gap: 16 — ABNORMAL HIGH (ref 5–15)
Anion gap: 15 (ref 5–15)
BUN: 18 mg/dL (ref 8–23)
BUN: 20 mg/dL (ref 8–23)
CO2: 19 mmol/L — ABNORMAL LOW (ref 22–32)
CO2: 25 mmol/L (ref 22–32)
Calcium: 8.1 mg/dL — ABNORMAL LOW (ref 8.9–10.3)
Calcium: 8 mg/dL — ABNORMAL LOW (ref 8.9–10.3)
Chloride: 102 mmol/L (ref 98–111)
Chloride: 97 mmol/L — ABNORMAL LOW (ref 98–111)
Creatinine, Ser: 1.19 mg/dL (ref 0.61–1.24)
Creatinine, Ser: 1.11 mg/dL (ref 0.61–1.24)
GFR, Estimated: >60 mL/min (ref >60)
GFR, Estimated: >60 mL/min (ref >60)
Glucose, Bld: 204 mg/dL — ABNORMAL HIGH (ref 70–99)
Glucose, Bld: 206 mg/dL — ABNORMAL HIGH (ref 70–99)
Phosphorus: 3.9 mg/dL (ref 2.5–4.6)
Phosphorus: 2.9 mg/dL (ref 2.5–4.6)
Potassium: 4.2 mmol/L (ref 3.5–5.1)
Potassium: 4.4 mmol/L (ref 3.5–5.1)
Sodium: 137 mmol/L (ref 135–145)
Sodium: 137 mmol/L (ref 135–145)

## 2023-06-01 LAB — PREPARE PLATELET PHERESIS
Unit division: 0
Unit division: 0
Unit division: 0
Unit division: 0
Unit division: 0

## 2023-06-01 LAB — BPAM FFP
Blood Product Expiration Date: 202409292359
Blood Product Expiration Date: 202409292359
ISSUE DATE / TIME: 202409242233
ISSUE DATE / TIME: 202409242233
Unit Type and Rh: 7300
Unit Type and Rh: 7300

## 2023-06-01 LAB — LACTIC ACID, PLASMA
Lactic Acid, Venous: 8.1 mmol/L (ref 0.5–1.9)
Lactic Acid, Venous: 6.9 mmol/L (ref 0.5–1.9)

## 2023-06-01 LAB — CG4 I-STAT (LACTIC ACID)
Lactic Acid, Venous: 4.3 mmol/L (ref 0.5–1.9)
Lactic Acid, Venous: 2.8 mmol/L (ref 0.5–1.9)
Lactic Acid, Venous: 2.4 mmol/L (ref 0.5–1.9)

## 2023-06-01 LAB — GLUCOSE, CAPILLARY
Glucose-Capillary: 204 mg/dL — ABNORMAL HIGH (ref 70–99)
Glucose-Capillary: 240 mg/dL — ABNORMAL HIGH (ref 70–99)
Glucose-Capillary: 229 mg/dL — ABNORMAL HIGH (ref 70–99)
Glucose-Capillary: 207 mg/dL — ABNORMAL HIGH (ref 70–99)
Glucose-Capillary: 207 mg/dL — ABNORMAL HIGH (ref 70–99)

## 2023-06-01 LAB — HEPATIC FUNCTION PANEL
ALT: 12 U/L (ref 0–44)
AST: 88 U/L — ABNORMAL HIGH (ref 15–41)
Albumin: 2 g/dL — ABNORMAL LOW (ref 3.5–5.0)
Alkaline Phosphatase: 80 U/L (ref 38–126)
Bilirubin, Direct: 2.4 mg/dL — ABNORMAL HIGH (ref 0.0–0.2)
Indirect Bilirubin: 2 mg/dL — ABNORMAL HIGH (ref 0.3–0.9)
Total Bilirubin: 4.4 mg/dL — ABNORMAL HIGH (ref 0.3–1.2)
Total Protein: 5.7 g/dL — ABNORMAL LOW (ref 6.5–8.1)

## 2023-06-01 LAB — CBC
HCT: 27.7 % — ABNORMAL LOW (ref 39.0–52.0)
HCT: 26.5 % — ABNORMAL LOW (ref 39.0–52.0)
Hemoglobin: 9.4 g/dL — ABNORMAL LOW (ref 13.0–17.0)
Hemoglobin: 9.3 g/dL — ABNORMAL LOW (ref 13.0–17.0)
MCH: 29.9 pg (ref 26.0–34.0)
MCH: 30.1 pg (ref 26.0–34.0)
MCHC: 33.9 g/dL (ref 30.0–36.0)
MCHC: 35.1 g/dL (ref 30.0–36.0)
MCV: 88.2 fL (ref 80.0–100.0)
MCV: 85.8 fL (ref 80.0–100.0)
Platelets: 66 10*3/uL — ABNORMAL LOW (ref 150–400)
Platelets: 38 10*3/uL — ABNORMAL LOW (ref 150–400)
RBC: 3.14 MIL/uL — ABNORMAL LOW (ref 4.22–5.81)
RBC: 3.09 MIL/uL — ABNORMAL LOW (ref 4.22–5.81)
RDW: 17 % — ABNORMAL HIGH (ref 11.5–15.5)
RDW: 17.2 % — ABNORMAL HIGH (ref 11.5–15.5)
WBC: 40.9 10*3/uL — ABNORMAL HIGH (ref 4.0–10.5)
WBC: 43.2 10*3/uL — ABNORMAL HIGH (ref 4.0–10.5)
nRBC: 1.7 % — ABNORMAL HIGH (ref 0.0–0.2)
nRBC: 1.6 % — ABNORMAL HIGH (ref 0.0–0.2)

## 2023-06-01 LAB — PROTIME-INR
INR: 1.6 — ABNORMAL HIGH (ref 0.8–1.2)
Prothrombin Time: 18.9 seconds — ABNORMAL HIGH (ref 11.4–15.2)

## 2023-06-01 LAB — PREPARE FRESH FROZEN PLASMA
Unit division: 0
Unit division: 0

## 2023-06-01 LAB — APTT: aPTT: 51 seconds — ABNORMAL HIGH (ref 24–36)

## 2023-06-01 LAB — LACTATE DEHYDROGENASE: LDH: 1076 U/L — ABNORMAL HIGH (ref 98–192)

## 2023-06-01 LAB — FIBRINOGEN: Fibrinogen: 482 mg/dL — ABNORMAL HIGH (ref 210–475)

## 2023-06-01 LAB — MAGNESIUM: Magnesium: 2.1 mg/dL (ref 1.7–2.4)

## 2023-06-01 MED ORDER — TRACE MINERALS CU-MN-SE-ZN 300-55-60-3000 MCG/ML IV SOLN
INTRAVENOUS | Status: AC
  Filled 2023-06-01: qty 876

## 2023-06-01 MED ORDER — SODIUM CHLORIDE 0.9 % IV SOLN
0.0080 mg/kg/h | INTRAVENOUS | Status: DC
  Administered 2023-06-01: 0.003 mg/kg/h via INTRAVENOUS
  Administered 2023-06-05: 0.005 mg/kg/h via INTRAVENOUS
  Filled 2023-06-01 (×2): qty 250

## 2023-06-01 MED ORDER — INSULIN GLARGINE-YFGN 100 UNIT/ML ~~LOC~~ SOLN
7.0000 [IU] | Freq: Every day | SUBCUTANEOUS | Status: DC
  Administered 2023-06-01: 7 [IU] via SUBCUTANEOUS
  Filled 2023-06-01 (×2): qty 0.07

## 2023-06-01 MED ORDER — SODIUM BICARBONATE 8.4 % IV SOLN
100.0000 meq | Freq: Once | INTRAVENOUS | Status: AC
  Administered 2023-06-01: 100 meq via INTRAVENOUS

## 2023-06-01 MED ORDER — ROCURONIUM BROMIDE 50 MG/5ML IV SOLN
100.0000 mg | Freq: Once | INTRAVENOUS | Status: AC
  Administered 2023-06-01: 100 mg via INTRAVENOUS

## 2023-06-01 MED ORDER — STERILE WATER FOR INJECTION IV SOLN
INTRAVENOUS | Status: DC
  Filled 2023-06-01 (×13): qty 150

## 2023-06-01 MED ORDER — INSULIN GLARGINE-YFGN 100 UNIT/ML ~~LOC~~ SOLN
10.0000 [IU] | Freq: Every day | SUBCUTANEOUS | Status: DC
  Filled 2023-06-01: qty 0.1

## 2023-06-01 MED ORDER — SODIUM BICARBONATE 8.4 % IV SOLN
INTRAVENOUS | Status: AC
  Filled 2023-06-01: qty 100

## 2023-06-01 MED FILL — Electrolyte-R (PH 7.4) Solution: INTRAVENOUS | Qty: 3000 | Status: AC

## 2023-06-01 MED FILL — Thrombin (Recombinant) For Soln 20000 Unit: CUTANEOUS | Qty: 1 | Status: AC

## 2023-06-01 MED FILL — Potassium Chloride Inj 2 mEq/ML: INTRAVENOUS | Qty: 40 | Status: AC

## 2023-06-01 MED FILL — Heparin Sodium (Porcine) Inj 1000 Unit/ML: INTRAMUSCULAR | Qty: 30 | Status: AC

## 2023-06-01 MED FILL — Mannitol IV Soln 20%: INTRAVENOUS | Qty: 500 | Status: AC

## 2023-06-01 MED FILL — Sodium Chloride IV Soln 0.9%: INTRAVENOUS | Qty: 5000 | Status: AC

## 2023-06-01 MED FILL — Calcium Chloride Inj 10%: INTRAVENOUS | Qty: 10 | Status: AC

## 2023-06-01 MED FILL — Sodium Bicarbonate IV Soln 8.4%: INTRAVENOUS | Qty: 300 | Status: AC

## 2023-06-01 MED FILL — Lidocaine HCl Local Preservative Free (PF) Inj 2%: INTRAMUSCULAR | Qty: 14 | Status: AC

## 2023-06-01 NOTE — Progress Notes (Signed)
Date of Exam: 05/31/2023 Medical Rec #:  [id number]      Height:       66.0 in Accession #:     [id number]     Weight:       186.7 lb Date of Birth:  Aug 23, 1961       BSA:          1.94 m Patient Age:    62 years       BP:           138/80 mmHg Patient Gender: M              HR:           68 bpm. Exam Location:  Anesthesiology Transesophogeal exam was perform intraoperatively during surgical procedure. Patient was closely monitored under general anesthesia during the entirety of examination. Indications:     VA ECMO conversion to VV ECMO Performing Phys: [id number] Alleen Borne Diagnosing Phys: Jairo Ben MD Complications: No known complications during this procedure. POST-OP IMPRESSIONS limited post exchange to VV ECMO _ Left Ventricle: The left ventricular function is essentially unchanged from pre-op exam. Overall EF 50-55%, visually with mild global hypokinesis. _ Right Ventricle: The right ventricular function remains mildly reduced, unchanged from pre-op exam. _ Aortic Valve: The aortic valve homograft appears unchanged from pre-op images. There is trivial regurgitation. _ Mitral Valve: The mitral valve function appears unchanged from pre-op images. _ Tricuspid Valve: There is mild tricuspid regurgitation.  -Right atrium: The old VA cannula has been removed, A large cannula is well positioned in the RA, with turbulent flow evident. PRE-OP FINDINGS  Left Ventricle: The left ventricle has low normal systolic function, with an ejection fraction of 50-55% on VA ECMO. Measured EF 54%. The cavity size was normal. Left ventrical mild global hypokinesis without regional wall motion abnormalities. There is  no left ventricular hypertrophy. Left ventricular diastolic function was not evaluated. Right Ventricle: The right ventricle has mildly reduced systolic function. The cavity was normal. There is no increase in right ventricular wall thickness. Left Atrium: Left atrial size was normal in size. No left atrial/left atrial appendage thrombus was detected. Left atrial appendage velocity is normal at greater than  40 cm/s. Right Atrium: Right atrial size was normal in size. VA ECMO catheter present in the right atrium. There appears to be thrombus/vegetation on distal tip of the catheter. Interatrial Septum: No atrial level shunt detected by color flow Doppler. There is left bowing of the interatrial septum, suggestive of elevated right atrial pressure. Pericardium: There is no evidence of pericardial effusion. Mitral Valve: The mitral valve is normal in structure. Mitral valve regurgitation is trivial by color flow Doppler. There is no evidence of mitral valve vegetation. There is no evidence of mitral stenosis. Tricuspid Valve: The tricuspid valve was normal in structure. Tricuspid valve regurgitation is trivial by color flow Doppler. No evidence of tricuspid stenosis is present. The tricuspid valve is status post repair with an annuloplasty ring. There is no evidence of tricuspid valve vegetation. Aortic Valve: The aortic valve has been repaired/replaced. Aortic valve regurgitation is trivial by color flow Doppler. 25 mm homograft valve is present in the aortic position. There is mild AI. The valve functions well, without AS or perivalvular leak. Pulmonic Valve: The pulmonic valve was normal in structure, with normal leaflet excursion. No evidence of pulmonic stenosis. Pulmonic valve regurgitation is mild by color flow Doppler, around the PA catheter. Aorta: The aortic arch are normal in size and  Patient ID: [name], male   DOB: 03-30-1961, 62 y.o.   MRN: [id number]     Advanced Heart Failure Rounding Note  PCP-Cardiologist: Kristeen Miss, MD   Subjective:    9/13: OR for Bentall/AVR, TV repair, re-implantation of SVG-RCA.  Post-op vasoplegia and pulmonary edema, required central VA ECMO (RA drainage, ascending aorta return.  Post-op TEE EF 55%, mild-moderate RV hypokinesis.  9/14: Blender added to circuit due to high PaO2 9/16: TEE with EF 30%, moderate LVH, moderate RV dysfunction with mild enlargement, stable bioprosthetic aortic valve, repaired TV with mild TR, no endocarditis noted.  9/18: Bronchoscopy with mucus plugs and clots. 9/19: To OR for mediastinal washout.  By TEE, EF 50% with moderate LVH, mild RV enlargement and mild RV dysfunction, stable bioprosthetic aortic valve, repaired TV with mild TR.  He did not tolerate lowering of ECMO flows even with increasing of pressors due to vasoplegia.  9/21: Bronchoscopy 9/24: Decannulated from [facility] ECMO. Chest closed. Transitioned to VV ECMO  Was hypotensive and acidotic overnight. Pressors increased. Transfused for bleeding. Lactic acid now coming down. Pressors being weaned   On VV ECMO. CXR still with dense R infiltrate   Objective:   Weight Range: 87.3 kg Body mass index is 31.06 kg/m.   Vital Signs:   Temp:  [97.3 F (36.3 C)-97.9 F (36.6 C)] 97.9 F (36.6 C) (09/25 0800) Pulse Rate:  [64-84] 78 (09/25 0800) Resp:  [0-24] 24 (09/25 0800) SpO2:  [95 %-100 %] 100 % (09/25 0800) Arterial Line BP: (104-154)/(45-83) 117/61 (09/25 0800) FiO2 (%):  [50 %-100 %] 50 % (09/25 0419) Weight:  [87.3 kg] 87.3 kg (09/25 0500) Last BM Date : 05/30/23  Weight change: Filed Weights   05/30/23 0500 05/31/23 0500 06/01/23 0500  Weight: 82.8 kg 84.7 kg 87.3 kg    Intake/Output:   Intake/Output Summary (Last 24 hours) at 06/01/2023 0841 Last data filed at 06/01/2023 0800 Gross per 24 hour  Intake 7361.59 ml  Output 3149  ml  Net 4212.59 ml      Physical Exam    General:  Critically-ill appearing. On vent HEENT: + ETT Neck:  +VV ECMO cannula + swan YTK:ZSWFUXN dressing. RRR  Lungs: coarse Abdomen: soft, nontender, nondistended. Hypoactive BS Extremities: no cyanosis, clubbing, rash, 1+ edema Neuro: intubated/sedated  Telemetry   Junctional rhythm + PVCs Personally reviewed  Labs    CBC Recent Labs    05/29/23 1722 05/30/23 0002 05/31/23 1952 05/31/23 2016 06/01/23 0424 06/01/23 0433 06/01/23 0750  WBC 49.2*   < > 42.7*  --  40.9*  --   --   NEUTROABS 25.7*  --   --   --   --   --   --   HGB 7.9*   < > 8.1*   < > 9.4* 9.2* 9.2*  HCT 24.5*   < > 24.8*   < > 27.7* 27.0* 27.0*  MCV 90.7   < > 90.5  --  88.2  --   --   PLT 17*   < > 72*  --  66*  --   --    < > = values in this interval not displayed.   Basic Metabolic Panel Recent Labs    23/55/73 0418 05/30/23 0613 05/31/23 0418 05/31/23 0747 05/31/23 1713 05/31/23 1800 06/01/23 0424 06/01/23 0433 06/01/23 0750  NA 134*   < > 135   < > 137   < > 137 138 137  K 3.7   < >  Date of Exam: 05/31/2023 Medical Rec #:  [id number]      Height:       66.0 in Accession #:     [id number]     Weight:       186.7 lb Date of Birth:  Aug 23, 1961       BSA:          1.94 m Patient Age:    62 years       BP:           138/80 mmHg Patient Gender: M              HR:           68 bpm. Exam Location:  Anesthesiology Transesophogeal exam was perform intraoperatively during surgical procedure. Patient was closely monitored under general anesthesia during the entirety of examination. Indications:     VA ECMO conversion to VV ECMO Performing Phys: [id number] Alleen Borne Diagnosing Phys: Jairo Ben MD Complications: No known complications during this procedure. POST-OP IMPRESSIONS limited post exchange to VV ECMO _ Left Ventricle: The left ventricular function is essentially unchanged from pre-op exam. Overall EF 50-55%, visually with mild global hypokinesis. _ Right Ventricle: The right ventricular function remains mildly reduced, unchanged from pre-op exam. _ Aortic Valve: The aortic valve homograft appears unchanged from pre-op images. There is trivial regurgitation. _ Mitral Valve: The mitral valve function appears unchanged from pre-op images. _ Tricuspid Valve: There is mild tricuspid regurgitation.  -Right atrium: The old VA cannula has been removed, A large cannula is well positioned in the RA, with turbulent flow evident. PRE-OP FINDINGS  Left Ventricle: The left ventricle has low normal systolic function, with an ejection fraction of 50-55% on VA ECMO. Measured EF 54%. The cavity size was normal. Left ventrical mild global hypokinesis without regional wall motion abnormalities. There is  no left ventricular hypertrophy. Left ventricular diastolic function was not evaluated. Right Ventricle: The right ventricle has mildly reduced systolic function. The cavity was normal. There is no increase in right ventricular wall thickness. Left Atrium: Left atrial size was normal in size. No left atrial/left atrial appendage thrombus was detected. Left atrial appendage velocity is normal at greater than  40 cm/s. Right Atrium: Right atrial size was normal in size. VA ECMO catheter present in the right atrium. There appears to be thrombus/vegetation on distal tip of the catheter. Interatrial Septum: No atrial level shunt detected by color flow Doppler. There is left bowing of the interatrial septum, suggestive of elevated right atrial pressure. Pericardium: There is no evidence of pericardial effusion. Mitral Valve: The mitral valve is normal in structure. Mitral valve regurgitation is trivial by color flow Doppler. There is no evidence of mitral valve vegetation. There is no evidence of mitral stenosis. Tricuspid Valve: The tricuspid valve was normal in structure. Tricuspid valve regurgitation is trivial by color flow Doppler. No evidence of tricuspid stenosis is present. The tricuspid valve is status post repair with an annuloplasty ring. There is no evidence of tricuspid valve vegetation. Aortic Valve: The aortic valve has been repaired/replaced. Aortic valve regurgitation is trivial by color flow Doppler. 25 mm homograft valve is present in the aortic position. There is mild AI. The valve functions well, without AS or perivalvular leak. Pulmonic Valve: The pulmonic valve was normal in structure, with normal leaflet excursion. No evidence of pulmonic stenosis. Pulmonic valve regurgitation is mild by color flow Doppler, around the PA catheter. Aorta: The aortic arch are normal in size and  Patient ID: [name], male   DOB: 03-30-1961, 62 y.o.   MRN: [id number]     Advanced Heart Failure Rounding Note  PCP-Cardiologist: Kristeen Miss, MD   Subjective:    9/13: OR for Bentall/AVR, TV repair, re-implantation of SVG-RCA.  Post-op vasoplegia and pulmonary edema, required central VA ECMO (RA drainage, ascending aorta return.  Post-op TEE EF 55%, mild-moderate RV hypokinesis.  9/14: Blender added to circuit due to high PaO2 9/16: TEE with EF 30%, moderate LVH, moderate RV dysfunction with mild enlargement, stable bioprosthetic aortic valve, repaired TV with mild TR, no endocarditis noted.  9/18: Bronchoscopy with mucus plugs and clots. 9/19: To OR for mediastinal washout.  By TEE, EF 50% with moderate LVH, mild RV enlargement and mild RV dysfunction, stable bioprosthetic aortic valve, repaired TV with mild TR.  He did not tolerate lowering of ECMO flows even with increasing of pressors due to vasoplegia.  9/21: Bronchoscopy 9/24: Decannulated from [facility] ECMO. Chest closed. Transitioned to VV ECMO  Was hypotensive and acidotic overnight. Pressors increased. Transfused for bleeding. Lactic acid now coming down. Pressors being weaned   On VV ECMO. CXR still with dense R infiltrate   Objective:   Weight Range: 87.3 kg Body mass index is 31.06 kg/m.   Vital Signs:   Temp:  [97.3 F (36.3 C)-97.9 F (36.6 C)] 97.9 F (36.6 C) (09/25 0800) Pulse Rate:  [64-84] 78 (09/25 0800) Resp:  [0-24] 24 (09/25 0800) SpO2:  [95 %-100 %] 100 % (09/25 0800) Arterial Line BP: (104-154)/(45-83) 117/61 (09/25 0800) FiO2 (%):  [50 %-100 %] 50 % (09/25 0419) Weight:  [87.3 kg] 87.3 kg (09/25 0500) Last BM Date : 05/30/23  Weight change: Filed Weights   05/30/23 0500 05/31/23 0500 06/01/23 0500  Weight: 82.8 kg 84.7 kg 87.3 kg    Intake/Output:   Intake/Output Summary (Last 24 hours) at 06/01/2023 0841 Last data filed at 06/01/2023 0800 Gross per 24 hour  Intake 7361.59 ml  Output 3149  ml  Net 4212.59 ml      Physical Exam    General:  Critically-ill appearing. On vent HEENT: + ETT Neck:  +VV ECMO cannula + swan YTK:ZSWFUXN dressing. RRR  Lungs: coarse Abdomen: soft, nontender, nondistended. Hypoactive BS Extremities: no cyanosis, clubbing, rash, 1+ edema Neuro: intubated/sedated  Telemetry   Junctional rhythm + PVCs Personally reviewed  Labs    CBC Recent Labs    05/29/23 1722 05/30/23 0002 05/31/23 1952 05/31/23 2016 06/01/23 0424 06/01/23 0433 06/01/23 0750  WBC 49.2*   < > 42.7*  --  40.9*  --   --   NEUTROABS 25.7*  --   --   --   --   --   --   HGB 7.9*   < > 8.1*   < > 9.4* 9.2* 9.2*  HCT 24.5*   < > 24.8*   < > 27.7* 27.0* 27.0*  MCV 90.7   < > 90.5  --  88.2  --   --   PLT 17*   < > 72*  --  66*  --   --    < > = values in this interval not displayed.   Basic Metabolic Panel Recent Labs    23/55/73 0418 05/30/23 0613 05/31/23 0418 05/31/23 0747 05/31/23 1713 05/31/23 1800 06/01/23 0424 06/01/23 0433 06/01/23 0750  NA 134*   < > 135   < > 137   < > 137 138 137  K 3.7   < >  Patient ID: [name], male   DOB: 03-30-1961, 62 y.o.   MRN: [id number]     Advanced Heart Failure Rounding Note  PCP-Cardiologist: Kristeen Miss, MD   Subjective:    9/13: OR for Bentall/AVR, TV repair, re-implantation of SVG-RCA.  Post-op vasoplegia and pulmonary edema, required central VA ECMO (RA drainage, ascending aorta return.  Post-op TEE EF 55%, mild-moderate RV hypokinesis.  9/14: Blender added to circuit due to high PaO2 9/16: TEE with EF 30%, moderate LVH, moderate RV dysfunction with mild enlargement, stable bioprosthetic aortic valve, repaired TV with mild TR, no endocarditis noted.  9/18: Bronchoscopy with mucus plugs and clots. 9/19: To OR for mediastinal washout.  By TEE, EF 50% with moderate LVH, mild RV enlargement and mild RV dysfunction, stable bioprosthetic aortic valve, repaired TV with mild TR.  He did not tolerate lowering of ECMO flows even with increasing of pressors due to vasoplegia.  9/21: Bronchoscopy 9/24: Decannulated from [facility] ECMO. Chest closed. Transitioned to VV ECMO  Was hypotensive and acidotic overnight. Pressors increased. Transfused for bleeding. Lactic acid now coming down. Pressors being weaned   On VV ECMO. CXR still with dense R infiltrate   Objective:   Weight Range: 87.3 kg Body mass index is 31.06 kg/m.   Vital Signs:   Temp:  [97.3 F (36.3 C)-97.9 F (36.6 C)] 97.9 F (36.6 C) (09/25 0800) Pulse Rate:  [64-84] 78 (09/25 0800) Resp:  [0-24] 24 (09/25 0800) SpO2:  [95 %-100 %] 100 % (09/25 0800) Arterial Line BP: (104-154)/(45-83) 117/61 (09/25 0800) FiO2 (%):  [50 %-100 %] 50 % (09/25 0419) Weight:  [87.3 kg] 87.3 kg (09/25 0500) Last BM Date : 05/30/23  Weight change: Filed Weights   05/30/23 0500 05/31/23 0500 06/01/23 0500  Weight: 82.8 kg 84.7 kg 87.3 kg    Intake/Output:   Intake/Output Summary (Last 24 hours) at 06/01/2023 0841 Last data filed at 06/01/2023 0800 Gross per 24 hour  Intake 7361.59 ml  Output 3149  ml  Net 4212.59 ml      Physical Exam    General:  Critically-ill appearing. On vent HEENT: + ETT Neck:  +VV ECMO cannula + swan YTK:ZSWFUXN dressing. RRR  Lungs: coarse Abdomen: soft, nontender, nondistended. Hypoactive BS Extremities: no cyanosis, clubbing, rash, 1+ edema Neuro: intubated/sedated  Telemetry   Junctional rhythm + PVCs Personally reviewed  Labs    CBC Recent Labs    05/29/23 1722 05/30/23 0002 05/31/23 1952 05/31/23 2016 06/01/23 0424 06/01/23 0433 06/01/23 0750  WBC 49.2*   < > 42.7*  --  40.9*  --   --   NEUTROABS 25.7*  --   --   --   --   --   --   HGB 7.9*   < > 8.1*   < > 9.4* 9.2* 9.2*  HCT 24.5*   < > 24.8*   < > 27.7* 27.0* 27.0*  MCV 90.7   < > 90.5  --  88.2  --   --   PLT 17*   < > 72*  --  66*  --   --    < > = values in this interval not displayed.   Basic Metabolic Panel Recent Labs    23/55/73 0418 05/30/23 0613 05/31/23 0418 05/31/23 0747 05/31/23 1713 05/31/23 1800 06/01/23 0424 06/01/23 0433 06/01/23 0750  NA 134*   < > 135   < > 137   < > 137 138 137  K 3.7   < >

## 2023-06-01 NOTE — Procedures (Signed)
Bedside Bronchoscopy Procedure Note [name] [id number] 22-Apr-1961  Procedure: Bronchoscopy Indications: Diagnostic evaluation of the airways, Obtain specimens for culture and/or other diagnostic studies, and Remove secretions  Procedure Details: ET Tube Size: ET Tube secured at lip (cm): Bite block in place: Yes In preparation for procedure, Patient hyper-oxygenated with 100 % FiO2 Airway entered and the following bronchi were examined: RUL, RML, RLL, LUL, LLL, and Bronchi.   Bronchoscope removed.    Evaluation BP 94/66   Pulse 76   Temp 97.9 F (36.6 C)   Resp (!) 24   Ht 5\' 6"  (1.676 m)   Wt 87.3 kg   SpO2 100%   BMI 31.06 kg/m  Breath Sounds:Diminished and Rhonch O2 sats: stable throughout Patient's Current Condition: stable Specimens:  Sent - Thick ping tinged secretions. Complications: No apparent complications Patient did tolerate procedure well.   [name] 06/01/2023, 9:44 AM

## 2023-06-01 NOTE — Procedures (Signed)
Intubation Procedure Note  [name]  [id number]  Nov 23, 1960  Date:06/01/23  Time:9:40 AM   Provider Performing:Sri Clegg C Katrinka Blazing    Procedure: Intubation (31500)  Indication(s) Respiratory Failure  Consent Part of ECMO consent   Anesthesia Heavily sedated already, gave 1x dose of rocuronium   Time Out Verified patient identification, verified procedure, site/side was marked, verified correct patient position, special equipment/implants available, medications/allergies/relevant history reviewed, required imaging and test results available.   Sterile Technique Usual hand hygeine, masks, and gloves were used   Procedure Description Patient positioned in bed supine.  Sedation given as noted above.  Existing ETT removed.  Patient was intubated with endotracheal tube using Glidescope.  View was Grade 1 full glottis .  Number of attempts was 1.  Colorimetric CO2 detector was consistent with tracheal placement.   Complications/Tolerance None; patient tolerated the procedure well. Chest X-ray is ordered to verify placement.   EBL Minimal   Specimen(s) None

## 2023-06-01 NOTE — Procedures (Signed)
Bronchoscopy Procedure Note  Germain Vankooten  [id number]  [birth year]  Date:06/01/23  Time:9:41 AM   Provider Performing:Silver Parkey C Katrinka Blazing   Procedure(s):  Flexible bronchoscopy with bronchial alveolar lavage [phone]) and Subsequent Therapeutic Aspiration of Tracheobronchial Tree [phone])  Indication(s) Persistent pneumonia  Consent Part of ECMO bundle  Anesthesia In place for ETT exchange   Time Out Verified patient identification, verified procedure, site/side was marked, verified correct patient position, special equipment/implants available, medications/allergies/relevant history reviewed, required imaging and test results available.   Sterile Technique Usual hand hygiene, masks, gowns, and gloves were used   Procedure Description Bronchoscope advanced through endotracheal tube and into airway.  Airways were examined down to subsegmental level with findings noted below.     Findings:  - ETT low, repositioned - Significant suction trauma at carina - R>L bronchiitic changes/erythema - Left lower lobe atelectasis from mucus plugging improved with therapeutic suctioning - Ongoing RUL, RML, RLL copious mucopurulent secretions, BAL done in RLL   Complications/Tolerance None; patient tolerated the procedure well. Chest X-ray is not needed post procedure.   EBL Minimal   Specimen(s) RLL BAL

## 2023-06-01 NOTE — Progress Notes (Signed)
Patient ID: [name], male   DOB: 1961-04-10, 62 y.o.   MRN: [id number]   Extracorporeal support note   ECLS support day: 2 Indication: Acute hypoxic respiratory failure  Configuration: Central VV ECMO  30FR Dual-lumen Crescent cannula in RIJ   Pump speed: 3365 rpm  Pump flow: 3.5 L/min Pump used: Cardiohelp  Sweep gas: 6  Circuit check: Small clot at 12p Anticoagulant: None for now due to post-op bleeding Anticoagulation targets:   Changes in support: none  Anticipated goals/duration of support: Wean off VV ECMO as tolerated  .Arvilla Meres MD  06/01/2023, 8:36 AM

## 2023-06-01 NOTE — Progress Notes (Signed)
NAME:  [name], MRN:  [id number], DOB:  [birth year], LOS: 14 ADMISSION DATE:  05/25/2023, CONSULTATION DATE:  9/11 REFERRING MD:  Dr. Izora Ribas, CHIEF COMPLAINT:  aortic valve dehiscence   History of Present Illness:  Patient is [name] w/ pertinent PMH CAD s/p CABG 2017 w/ AVR, HLD, HTN, prior CVA, T2DM, CKD4 was previously on HD presents to [facility] on 9/10 chest pain.  Patient recently admitted to [facility] on 5/25 w/ AKI and AMS. While in ED patient had PEA arrest w/ ROSC in about 8 minutes. Patient required dialysis on this admission. Also w/ MSSA bacteremia associated w/ tricuspid valve endocarditis. Patient transferred to [facility] on 5/25. Treated w/ 6 week course oxacillin and rifampin. This admission also suffered a R cerebellar CVA w/ MRI likely septic emboli and had cholangitis/pancreatitis requiring ERCP.  On 9/10 patient admitted to [facility] w/ chest pain, dyspnea, and BLE edema. BNP 2,097. CXR w/ pulm edema. Patient started on IV lasix infusion. Cards consulted. On 9/11 patient transferred to [facility]. Patient's echo showing possible aortic valve dehiscence. Cultures repeated and started on rocephin. Patient transferred to ICU for TEE and general anesthesia and to start CRRT. PCCM consulted.  Pertinent  Medical History   Past Medical History:  Diagnosis Date   Allergy    Anemia    Anxiety    Baker's cyst of knee    Blood transfusion without reported diagnosis    as baby    Coronary artery disease    quadruple bypass - March 2016   GERD (gastroesophageal reflux disease)    Gouty arthritis    "real bad" (01/17/2013)   Heart murmur    Hypercholesteremia    Hypertension    MSSA bacteremia 01/29/2023   Myocardial infarction (HCC) 2017   PEA (Pulseless electrical activity) (HCC) 01/29/2023   Stroke (HCC)    Type II diabetes mellitus (HCC)     Significant Hospital Events: Including procedures, antibiotic start and stop dates in addition to other pertinent events    9/10 admitted 9/11 echo showing aortic valve dehiscence, confirmed by TEE.  9/11 started (back) on CRRT for volume overload and known CKD 4 9/12 breathing better after CRRT 9/13 to OR for Redo of aortic valve, tricuspid valve repair and ascending aortic root replacement w/ re-implantation of SVG to RCA. Post op TEE EF 55% RV mid-mod HK, AVR/TV repair stable. Could not come off CPB. Worse pulmonary edema. High dose pressors. Cannulated and started on VA ECMO w/ central cannulation  9/16 MDT ECMO rounds this AM, plans to remove more volume, 1 U PRBCs  9/18 bronch, clot and mucus plugs removed  9/19 OR for washout  9/20 bronch 9/21 bronch 9/24 VA to VV, some bleeding and acidemia issues postop  Interim History / Subjective:  Bleeding leading to escalating pressors leading to worsening acidemia.  Improved after blood products.  Dr. Laneta Simmers looked at chest today and oozing has eased up considerably.  Objective   Blood pressure 94/66, pulse 78, temperature 97.9 F (36.6 C), resp. rate (!) 24, height 5\' 6"  (1.676 m), weight 87.3 kg, SpO2 100%. PAP: (34-63)/(16-37) 49/25 CVP:  [5 mmHg-15 mmHg] 12 mmHg PCWP:  [9 mmHg] 9 mmHg CO:  [6.5 L/min-8.4 L/min] 8.4 L/min CI:  [3.3 L/min/m2-4.3 L/min/m2] 4.3 L/min/m2  Vent Mode: PRVC FiO2 (%):  [50 %-100 %] 50 % Set Rate:  [24 bmp] 24 bmp Vt Set:  [400 mL] 400 mL PEEP:  [5 cmH20] 5 cmH20 Plateau Pressure:  [  30 cmH20-32 cmH20] 32 cmH20   Intake/Output Summary (Last 24 hours) at 06/01/2023 1610 Last data filed at 06/01/2023 0800 Gross per 24 hour  Intake 7361.59 ml  Output 3149 ml  Net 4212.59 ml   Filed Weights   05/30/23 0500 06/03/2023 0500 06/01/23 0500  Weight: 82.8 kg 84.7 kg 87.3 kg    Examination: Sedated on vent Pupils equal RIJ crescent in place Sternotomy site dressed without strikethrough ECMO circuit looks okay Abd remains quiescent Ext some mild edema  ABG alkalotic, coming down on sweep CXR continued dense R consolidation  now with some edema superimposed Hemolysis labs stable BMP stable Lactate downtrending   Assessment & Plan:   Post cardiotomy vasoplegia/cardioplegia w/ failure to wean from CPB- improved; 9/24 converted to VV Acute on chronic combined HF, Hx MSSA endocarditis- S/p AVR/Bentall and reimplantation of Coronaries and TV repair 9/13 Acute respiratory failure w/ hypoxia due to volume overload, with untreated OSA, and dense nonresolving R infiltrate CKD 4. Had recently been on HD- now on CRRT Circuit related hemolysis, thrombocytopenia consumptive- stable TF intolerance- ongoing issue now on TPN CAD s/p CABG in 2017 w/ AVR HTN HLD Hx of CVA DMT2 Leukocytosis, multifactorial   - VV ECMO for pH 7.35-7.45 - Change out ETT and repeat bronch today - CRRT even after AHF/TCTS discussion - Pressors for MAP 65 - TPN per pharmD, NGT to LIS - Broad spectrum abx, duration TBD - Insulin PRN - Heavy sedation for now - Start postural drainage with R side up - Start low dose bival and keep eye on incisional bleeding - Discussed above with AHF+TCTS+ECMO specialists  Best Practice (right click and "Reselect all SmartList Selections" daily)   Diet/type: TPN DVT prophylaxis: bival GI prophylaxis: PPI Lines: Central line and Dialysis Catheter Foley: yes Code Status:  full code Last date of multidisciplinary goals of care discussion [per TCTS and AHF]  35 min cc time Myrla Halsted MD [facility] 06/01/2023 8:42 AM

## 2023-06-01 NOTE — Progress Notes (Signed)
Recevied call from bedside RN  Increased lactate, acidemia, s/p 4 amps bicarb On peripheral gtt Change post-fluids to isotonic bicarb  Call with questions  Bufford Buttner MD [facility] [phone]

## 2023-06-01 NOTE — Progress Notes (Addendum)
PHARMACY - TOTAL PARENTERAL NUTRITION CONSULT NOTE   Indication:  unable to tolerate tube feeding  Patient Measurements: Height: 5\' 6"  (167.6 cm) Weight: 87.3 kg (192 lb 7.4 oz) IBW/kg (Calculated) : 63.8 TPN AdjBW (KG): 69 Body mass index is 31.06 kg/m. Usual Weight: ~90kg  Assessment: 62 yo male with significant PMH for HLD, HTN, T2DM, CKD4 (prev on HD) and recent MSSA bacteremia + endocarditis (May 2024). Back in May 2024 admission, underwent ERCP for cholangitis/pancreatitis. Admitted on 9/11 w/ chest pain found to have aortic valve dehiscence. S/p aortic valve replacement redo on 9/13 - developed pulmonary edema post op and was placed on VA ECMO. S/p mediastinal washout on 9/19 - chest still open. Unable to speak w/ patient due to intubation (utilizing Precedex and Versed drips for sedation). Pharmacy consulted to manage TPN for inability to tolerate tube feeding and malnutrition.   Glucose / Insulin: hx T2DM - A1c 6.1%, on Farxiga PTA. Previously on insulin gtt post CT surgery >> transitioned to mSSI q4h. 16 units SSI used since TPN at goal rate. CBGs 169-240 since TPN at goal rate.  Electrolytes: K 4.2 (stable), Na 137, Phos 3.9, last Mg 2.5 (none in TPN), CoCa 9.7 (none in TPN), CO2 19 (started on bicarb gtt overnight and transitioned to bicarb post-filter CRRT fluids) Renal: Hx CKD4 currently on CRRT (required HD in the past), using 4K bags, systemic bivalirudin for anticoag - not using systemic sodium citrate infusion, only for catheter lock per d/w RN at bedside. CRRT 9/11 >>  Hepatic: Alk phos 80, AST 88/ALT 12 - both rising slightly, Tbili 4.4, albumin 2, TG 122 Intake / Output: 50mL OG output, Trickle TF @ 20 mL/hr stopped 9/22 PM due to inc OG output and no bowel sounds. Relistor x1 given 9/23, receiving Reglan 5mg  IV q6h - Chest tube output , stool output 35 mL - NGT placed 9/22, Cortrak placed 9/20  MIVF: LR and NS @ KVO, Bicarb gtt for CRRT post-filter fluids GI  Imaging:  - None since TPN start  GI Surgeries / Procedures:  - None since TPN start - planning transition to VV ECMO on 9/24  Central access: 9/13 TPN start date: 9/23  Nutritional Goals: Goal concentrated TPN rate is 75 mL/hr (provides 131 g of protein and 2250 kcals per day)  RD Assessment: Estimated Needs Total Energy Estimated Needs: 2200-2400 Total Protein Estimated Needs: 130-150 grams Total Fluid Estimated Needs: 1L plus UOP  Current Nutrition:  NPO and TPN  Plan:  Continue concentrated TPN at 75 mL/hr at 1800 - will provide 2250 kcal and 131 g protein meeting ~100% of needs Electrolytes in TPN: Na 34mEq/L, K 24mEq/L, Ca 0 mEq/L, Mg 81mEq/L, and Phos to 17 mmol/L. Cl;Ac 1:2 Standard MVI and trace elements within TPN No chromium (on CRRT and currently on shortage) Add thiamine to TPN x 5 days for refeeding risk (9/23 >> 9/27) Continue Moderate q4h SSI and adjust as needed  Start Semglee 7 units SQ daily per discussion with CCM - can consider adding to TPN bag once needs determined.  Continue MIVF at [facility] Monitor TPN labs on Mon/Thurs, RFP BID while on CRRT  Rexford Maus, PharmD, BCPS 06/01/2023 7:57 AM

## 2023-06-01 NOTE — Progress Notes (Addendum)
ANTICOAGULATION CONSULT NOTE - Follow up Consult  Pharmacy Consult for bivalirudin Indication:  VA ECMO  Allergies  Allergen Reactions   Other Anaphylaxis    Mushrooms  Not listed on MAR    Plavix [Clopidogrel Bisulfate] Other (See Comments)    TTP Not listed on the [facility]   Fleet Enema [Enema] Other (See Comments)    Unknown reaction   Lovenox [Enoxaparin] Other (See Comments)    Unknown reaction   Morphine Other (See Comments)    Unknown reaction   Nsaids Other (See Comments)    Unknown reaction   Hydrocodone-Acetaminophen Itching    Patient Measurements: Height: 5\' 6"  (167.6 cm) Weight: 87.3 kg (192 lb 7.4 oz) IBW/kg (Calculated) : 63.8 Heparin Dosing Weight: 81.3 kg  Vital Signs: Temp: 98.1 F (36.7 C) (09/25 1200) Temp Source: Core (09/25 0800) Pulse Rate: 74 (09/25 1200)  Labs: Recent Labs    05/30/23 0418 05/30/23 5621 05/30/23 1608 05/30/23 1945 05/31/23 0418 05/31/23 0747 05/31/23 1713 05/31/23 1800 05/31/23 1952 05/31/23 2016 06/01/23 0424 06/01/23 0433 06/01/23 0750 06/01/23 1155 06/01/23 1317  HGB 8.9*   < > 8.6*   < > 8.3*   < > 9.2*   < > 8.1*   < > 9.4*   < > 9.2* 9.5* 9.5*  HCT 27.2*   < > 26.2*   < > 25.3*   < > 27.8*   < > 24.8*   < > 27.7*   < > 27.0* 28.0* 28.0*  PLT 14*  --  14*  --  23*  --  56*  --  72*  --  66*  --   --   --   --   APTT 60*  --  71*  --  67*  --   --   --   --   --   --   --   --   --   --   LABPROT 18.5*  --   --   --  17.7*  --   --   --   --   --  18.9*  --   --   --   --   INR 1.5*  --   --   --  1.4*  --   --   --   --   --  1.6*  --   --   --   --   CREATININE 1.13  --  1.20  --  1.06  --  1.26*  --   --   --  1.19  --   --   --   --    < > = values in this interval not displayed.    Estimated Creatinine Clearance: 65.8 mL/min (by C-G formula based on SCr of 1.19 mg/dL).   Assessment: 62 yom underwent Bentall/AVR, TV repair, re-implantation of SVG-RCA complicated with vasoplegia and pulmonary edema  requiring central VA ECMO. On 9/24 patient underwent transition VA to VV ECMO. Bivalirudin continuous infusion was held prior to procedure. Patient received a total of 5 units of PRBC, 5 units of platelets, and 2 units of FFP. Hgb stable at 9.5 and Plt up to 66. Of note, CRRT was stopped for procedure (~6 hours) but resumed upon arrival back to the floor at 1800. No AC PTA.  Resumed bivalirudin the morning after arriving back from the OR following ECMO VA > VV. Hgb stable at 9.5. Plt this AM 66 after replacement. Slight oozing from R internal jugular  site but stable. Single small clot in circuit. No fibrin streaks. LDH 1076 and fibrinogen 482, both elevated but stable  aPTT is therapeutic at 51 sec on bivalirudin 0.003 mg/kg/hour ~5 hours after resuming infusion.   Goal of Therapy:  aPTT 50-70 seconds Monitor platelets by anticoagulation protocol: Yes   Plan:  Continue bivalirudin at 0.003 mg/kg/hr  Monitor q12 hr aPTT and CBC, LDH, fibrinogen and for s/sx of bleeding.  Wilmer Floor, PharmD PGY2 Cardiology Pharmacy Resident  06/01/2023 2:08 PM

## 2023-06-01 NOTE — Progress Notes (Signed)
Patient ID: [name], male   DOB: Jun 02, 1961, 62 y.o.   MRN: [id number] S: Pt underwent conversion from AV to VV ECMO and chest closure last night.  Became acidotic and started on isotonic bicarb in post-filter replacement fluid.  Stable this morning.  Pt was seen and examined while on CRRT and labs and orders reviewed and adjustments made accordingly.  O:BP 94/66   Pulse 78   Temp 97.9 F (36.6 C)   Resp (!) 24   Ht 5\' 6"  (1.676 m)   Wt 87.3 kg   SpO2 100%   BMI 31.06 kg/m   Intake/Output Summary (Last 24 hours) at 06/01/2023 0853 Last data filed at 06/01/2023 0800 Gross per 24 hour  Intake 7361.59 ml  Output 3149 ml  Net 4212.59 ml   Intake/Output: I/O last 3 completed shifts: In: 9774.6 [I.V.:4380.3; Blood:4124.2; NG/GT:110; IV Piggyback:1160.2] Out: 5423 [Emesis/NG output:150; Stool:35; Blood:725; Chest Tube:634]  Intake/Output this shift:  Total I/O In: 169.4 [I.V.:138.5; IV Piggyback:30.9] Out: 204 [Stool:35] Weight change: 2.6 kg Gen: intubated and sedated CVS: RRR Resp:scattered rhonchi ION:[id number] BS, soft, NT/ND GMW:NUUVO to 1+ edema  Recent Labs  Lab 06/07/2023 0404 Jun 07, 2023 0411 05/27/23 0307 05/27/23 0315 05/28/23 0411 05/28/23 0416 05/29/23 0407 05/29/23 0751 05/29/23 1609 05/29/23 1612 05/30/23 0418 05/30/23 5366 05/30/23 1608 05/30/23 1945 05/22/2023 0418 05/08/2023 0747 05/12/2023 1713 06/02/2023 1800 05/19/2023 2016 05/18/2023 2149 05/24/2023 2329 06/01/23 0208 06/01/23 0424 06/01/23 0433 06/01/23 0750  NA 135   < > 136   < > 136   < > 134*   < > 135   < > 134*   < > 135   < > 135   < > 137   < > 138 137 138 138 137 138 137  K 4.0   < > 4.3   < > 4.1   < > 3.7   < > 3.9   < > 3.7   < > 3.9   < > 3.8   < > 3.8   < > 4.5 4.7 4.6 4.2 4.2 4.2 4.0  CL 104   < > 99   < > 103   < > 99  --  104  --  102  --  102  --  101  --  106  --   --   --   --   --  102  --   --   CO2 22   < > 23   < > 23   < > 21*  --  24  --  22  --  24  --  23  --  21*  --   --    --   --   --  19*  --   --   GLUCOSE 118*   < > 107*   < > 142*   < > 163*  --  135*  --  118*  --  141*  --  172*  --  117*  --   --   --   --   --  204*  --   --   BUN 21   < > 17   < > 16   < > 29*  --  30*  --  22  --  16  --  14  --  17  --   --   --   --   --  18  --   --   CREATININE 1.14   < >  1.20   < > 1.13   < > 1.11  --  1.12  --  1.13  --  1.20  --  1.06  --  1.26*  --   --   --   --   --  1.19  --   --   ALBUMIN 2.3*  2.3*   < > 2.2*  2.2*   < > 2.1*   < > 1.9*  2.0*  --  1.9*  --  1.8*  --  1.9*  --  1.9*  1.9*  --  1.8*  --   --   --   --   --  2.0*  2.0*  --   --   CALCIUM 8.2*   < > 8.2*   < > 8.4*   < > 8.7*  --  7.9*  --  8.3*  --  8.0*  --  8.7*  --  8.9  --   --   --   --   --  8.1*  --   --   PHOS 1.7*   < > 2.2*   < > 1.8*   < > 2.1*  --  2.2*  --  2.0*  --  4.0  --  2.8  --  4.8*  --   --   --   --   --  3.9  --   --   AST 64*  --  68*  --  60*  --  71*  --   --   --  76*  --   --   --  69*  --   --   --   --   --   --   --  88*  --   --   ALT 12  --  11  --  9  --  8  --   --   --  9  --   --   --  9  --   --   --   --   --   --   --  12  --   --    < > = values in this interval not displayed.   Liver Function Tests: Recent Labs  Lab 05/30/23 0418 05/30/23 1608 05/09/2023 0418 06/02/2023 1713 06/01/23 0424  AST 76*  --  69*  --  88*  ALT 9  --  9  --  12  ALKPHOS 99  --  99  --  80  BILITOT 3.0*  --  3.2*  --  4.4*  PROT 6.1*  --  6.5  --  5.7*  ALBUMIN 1.8*   < > 1.9*  1.9* 1.8* 2.0*  2.0*   < > = values in this interval not displayed.   No results for input(s): "LIPASE", "AMYLASE" in the last 168 hours. No results for input(s): "AMMONIA" in the last 168 hours. CBC: Recent Labs  Lab 05/29/23 1722 05/30/23 0002 05/30/23 1608 05/30/23 1945 06/02/2023 0418 05/27/2023 0747 05/13/2023 1713 05/29/2023 1800 06/05/2023 1952 06/01/2023 2016 06/01/23 0424 06/01/23 0433 06/01/23 0750  WBC 49.2*   < > 44.9*  --  44.3*  --  33.2*  --  42.7*  --  40.9*  --   --    NEUTROABS 25.7*  --   --   --   --   --   --   --   --   --   --   --   --  HGB 7.9*   < > 8.6*   < > 8.3*   < > 9.2*   < > 8.1*   < > 9.4* 9.2* 9.2*  HCT 24.5*   < > 26.2*   < > 25.3*   < > 27.8*   < > 24.8*   < > 27.7* 27.0* 27.0*  MCV 90.7   < > 93.6  --  93.7  --  88.5  --  90.5  --  88.2  --   --   PLT 17*   < > 14*  --  23*  --  56*  --  72*  --  66*  --   --    < > = values in this interval not displayed.   Cardiac Enzymes: No results for input(s): "CKTOTAL", "CKMB", "CKMBINDEX", "TROPONINI" in the last 168 hours. CBG: Recent Labs  Lab 05/30/2023 1856 05/10/2023 2153 06/04/2023 2328 06/01/23 0431 06/01/23 0749  GLUCAP 159* 150* 169* 204* 240*    Iron Studies: No results for input(s): "IRON", "TIBC", "TRANSFERRIN", "FERRITIN" in the last 72 hours. Studies/Results: DG CHEST PORT 1 VIEW  Result Date: 05/11/2023 CLINICAL DATA:  Chest pain, history of ECMO. EXAM: PORTABLE CHEST 1 VIEW COMPARISON:  Same day. FINDINGS: The heart size and mediastinal contours are within normal limits. Right internal jugular dialysis catheter is again noted. Endotracheal tube is in good position. Left internal jugular catheter is unchanged. Bilateral chest tubes are noted without pneumothorax. Status post coronary bypass graft. ECMO device is noted. Bilateral lung opacities are again noted, right greater than left. The visualized skeletal structures are unremarkable. IMPRESSION: Stable support apparatus and bilateral lung opacities as noted above. Electronically Signed   By: Lupita Raider M.D.   On: 06/03/2023 19:51   DG Abd 1 View  Result Date: 06/03/2023 CLINICAL DATA:  Orogastric tube placement. EXAM: ABDOMEN - 1 VIEW COMPARISON:  May 29, 2023. FINDINGS: Distal tip of nasogastric tube is seen in expected position of proximal stomach. IMPRESSION: Distal tip of nasogastric tube is seen in expected position of proximal stomach. Electronically Signed   By: Lupita Raider M.D.   On: 05/25/2023 19:49    ECHO INTRAOPERATIVE TEE  Result Date: 05/22/2023  *INTRAOPERATIVE TRANSESOPHAGEAL REPORT *  Patient Name:   [name]  Date of Exam: 05/28/2023 Medical Rec #:  [id number]      Height:       66.0 in Accession #:    [id number]     Weight:       186.7 lb Date of Birth:  Jun 01, 1961       BSA:          1.94 m Patient Age:    62 years       BP:           138/80 mmHg Patient Gender: M              HR:           68 bpm. Exam Location:  Anesthesiology Transesophogeal exam was perform intraoperatively during surgical procedure. Patient was closely monitored under general anesthesia during the entirety of examination. Indications:     VA ECMO conversion to VV ECMO Performing Phys: [id number] Alleen Borne Diagnosing Phys: Jairo Ben MD Complications: No known complications during this procedure. POST-OP IMPRESSIONS limited post exchange to VV ECMO _ Left Ventricle: The left ventricular function is essentially unchanged from pre-op exam. Overall EF 50-55%, visually with mild global hypokinesis. _ Right Ventricle:  The right ventricular function remains mildly reduced, unchanged from pre-op exam. _ Aortic Valve: The aortic valve homograft appears unchanged from pre-op images. There is trivial regurgitation. _ Mitral Valve: The mitral valve function appears unchanged from pre-op images. _ Tricuspid Valve: There is mild tricuspid regurgitation.  -Right atrium: The old VA cannula has been removed, A large cannula is well positioned in the RA, with turbulent flow evident. PRE-OP FINDINGS  Left Ventricle: The left ventricle has low normal systolic function, with an ejection fraction of 50-55% on VA ECMO. Measured EF 54%. The cavity size was normal. Left ventrical mild global hypokinesis without regional wall motion abnormalities. There is  no left ventricular hypertrophy. Left ventricular diastolic function was not evaluated. Right Ventricle: The right ventricle has mildly reduced systolic function. The cavity was normal.  There is no increase in right ventricular wall thickness. Left Atrium: Left atrial size was normal in size. No left atrial/left atrial appendage thrombus was detected. Left atrial appendage velocity is normal at greater than 40 cm/s. Right Atrium: Right atrial size was normal in size. VA ECMO catheter present in the right atrium. There appears to be thrombus/vegetation on distal tip of the catheter. Interatrial Septum: No atrial level shunt detected by color flow Doppler. There is left bowing of the interatrial septum, suggestive of elevated right atrial pressure. Pericardium: There is no evidence of pericardial effusion. Mitral Valve: The mitral valve is normal in structure. Mitral valve regurgitation is trivial by color flow Doppler. There is no evidence of mitral valve vegetation. There is no evidence of mitral stenosis. Tricuspid Valve: The tricuspid valve was normal in structure. Tricuspid valve regurgitation is trivial by color flow Doppler. No evidence of tricuspid stenosis is present. The tricuspid valve is status post repair with an annuloplasty ring. There is no evidence of tricuspid valve vegetation. Aortic Valve: The aortic valve has been repaired/replaced. Aortic valve regurgitation is trivial by color flow Doppler. 25 mm homograft valve is present in the aortic position. There is mild AI. The valve functions well, without AS or perivalvular leak. Pulmonic Valve: The pulmonic valve was normal in structure, with normal leaflet excursion. No evidence of pulmonic stenosis. Pulmonic valve regurgitation is mild by color flow Doppler, around the PA catheter. Aorta: The aortic arch are normal in size and structure. There is evidence of plaque in the aortic arch; Grade I, measuring 1-30mm in size. There is a homograft seen in the position of the ascending aorta. Pulmonary Artery: The pulmonary artery is of normal size. Venous: The inferior vena cava was not well visualized. Shunts: There is no evidence of an  atrial septal defect.  Jairo Ben MD Electronically signed by Jairo Ben MD Signature Date/Time: 06-26-23/6:07:38 PM    Final    CARDIAC CATHETERIZATION  Result Date: Jun 26, 2023 See surgical note for result.  EP STUDY  Result Date: 06-26-23 See surgical note for result.  HYBRID OR IMAGING ([facility] ONLY)  Result Date: 2023/06/26 There is no interpretation for this exam.  This order is for images obtained during a surgical procedure.  Please See "Surgeries" Tab for more information regarding the procedure.   DG CHEST PORT 1 VIEW  Result Date: 06-26-23 CLINICAL DATA:  Provided history: Personal history of ECMO. EXAM: PORTABLE CHEST 1 VIEW COMPARISON:  Chest radiographs 05/30/2023 and earlier. FINDINGS: ET tube present with tip 3.5 cm above the level of the carina. Mediastinal and bilateral chest tubes. ECMO cannulation support apparatus. An enteric tube is terminates at the expected level of the  gastric fundus/body (with side port at the level of the distal esophagus/GE junction). A second enteric tube passes below the level of left hemidiaphragm and terminates outside of the field of view. Right-sided central venous catheter with tip at the level of the superior cavoatrial junction. Left IJ approach central venous catheter terminating in the region of the main pulmonary artery. The cardiomediastinal silhouette is unchanged. Persistent dense airspace disease throughout much of the right lung. No evidence of pleural effusion or pneumothorax. IMPRESSION: 1. Support apparatus as described. 2. Persistent dense airspace disease throughout much of the right lung, similar to the prior examination of 05/30/2023. Electronically Signed   By: Jackey Loge D.O.   On: 05/12/2023 11:06    sodium chloride   Intravenous Once   sodium chloride   Intravenous Once   sodium chloride   Intravenous Once   sodium chloride   Intravenous Once   sodium chloride   Intravenous Once   sodium chloride   Intravenous  Once   sodium chloride   Intravenous Once   atorvastatin  80 mg Per Tube q1800   bisacodyl  10 mg Oral Daily   Or   bisacodyl  10 mg Rectal Daily   Chlorhexidine Gluconate Cloth  6 each Topical Daily   docusate  100 mg Per Tube BID   ezetimibe  10 mg Per Tube Daily   insulin aspart  0-15 Units Subcutaneous Q4H   metoCLOPramide (REGLAN) injection  5 mg Intravenous Q6H   mouth rinse  15 mL Mouth Rinse Q2H   pantoprazole (PROTONIX) IV  40 mg Intravenous QHS   sodium chloride flush  3 mL Intravenous Q12H    BMET    Component Value Date/Time   NA 137 06/01/2023 0750   NA 143 11/02/2022 1454   K 4.0 06/01/2023 0750   CL 102 06/01/2023 0424   CO2 19 (L) 06/01/2023 0424   GLUCOSE 204 (H) 06/01/2023 0424   BUN 18 06/01/2023 0424   BUN 31 (H) 11/02/2022 1454   CREATININE 1.19 06/01/2023 0424   CREATININE 2.71 (H) 06/04/2023 0950   CREATININE 1.05 02/24/2015 1601   CALCIUM 8.1 (L) 06/01/2023 0424   GFRNONAA >60 06/01/2023 0424   GFRNONAA 26 (L) 05/29/2023 0950   GFRNONAA 80 02/24/2015 1601   GFRAA 62 10/09/2020 1520   GFRAA >89 02/24/2015 1601   CBC    Component Value Date/Time   WBC 40.9 (H) 06/01/2023 0424   RBC 3.14 (L) 06/01/2023 0424   HGB 9.2 (L) 06/01/2023 0750   HGB 6.4 (LL) 06/05/2023 0852   HGB 10.2 (L) 08/12/2022 0934   HCT 27.0 (L) 06/01/2023 0750   HCT 29.2 (L) 08/12/2022 0934   PLT 66 (L) 06/01/2023 0424   PLT 109 (L) 05/08/2023 0852   PLT 252 08/12/2022 0934   MCV 88.2 06/01/2023 0424   MCV 94 08/12/2022 0934   MCH 29.9 06/01/2023 0424   MCHC 33.9 06/01/2023 0424   RDW 17.0 (H) 06/01/2023 0424   RDW 15.5 (H) 08/12/2022 0934   LYMPHSABS 5.8 (H) 05/29/2023 1722   LYMPHSABS 1.9 11/04/2021 1028   MONOABS 6.3 (H) 05/29/2023 1722   EOSABS 0.0 05/29/2023 1722   EOSABS 0.2 11/04/2021 1028   BASOSABS 0.0 05/29/2023 1722   BASOSABS 0.1 11/04/2021 1028      Assessment/Plan:   Anuric AKI/dialysis dependent AKI with fluid overload: Started on RRT during  previous admission in May 2024 until 05/10/23 (recovered). Patient is status post cardiac surgery on 9/13 and currently  on ECMO. CRRT start 9/11. He remains anuric with fluid overload.  Continue to run CRRT at current prescription (resume post-op).  UF as tolerated by BP and ECMO, Angiomax for anticoagulation.  UFG: net even. Discussed with ICU RN at bedside. CRRT ordrers:  fluids 4K/2.5Ca; dialysate 1500 mL/hr, 4K/2.5Ca pre-filter at 400 mL/hr, post-filter isotonic bicarb at 200 mL/hr, no anticoagulation and UF goal is to keep even today and hopefully can start to UF 50 mL/ if Bp stabilizes.  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  Per CTS AHRF: VDRF, per primary service. Abx/antifungal per primary service Postcardiotomy vasoplegia/shock with failure to wean: Currently on ECMO per HF team.  On pressors per primary service.  Volume managing with CRRT. S/p washout 06-15-2023. Started on midodrine. Hyperkalemia: Managed with CRRT prescription and follow labs. K WNL Anemia, thrombocytopenia: Transfuse as needed per primary team. Hypophosphatemia - improved after repletion  Irena Cords, MD [facility]

## 2023-06-01 NOTE — Progress Notes (Signed)
1 Day Post-Op Procedure(s) (LRB): CONVERSION TO VV ECMO (EXTRACORPOREAL MEMBRANE OXYGENATION) (N/A) DECANNULATION FOR ECMO (N/A) TRANSESOPHAGEAL ECHOCARDIOGRAM (N/A) Subjective:  Labile hemodynamics last night due to bleeding from wound due to coagulopathy. Chest tube output remained low. Vasopressors jacked up to 30 NE, .05 vaso, 15 epi with subsequent increase in lactate. Hgb dropped to 7.1. We transfused him 2 units PRBC's and 2 units FFP after plts last night and bleeding stopped and able to wean off NE overnight.  This am on Epi 8, vaso 0.04. PA 50/27, CVP 11, CI 4.3 ( falsely elevated due to Crescent cannula in RA).  Lactate down to 6,9 from 11.9 at 11:30 pm when on high dose vasopressors.  Objective: Vital signs in last 24 hours: Temp:  [97.3 F (36.3 C)-97.9 F (36.6 C)] 97.9 F (36.6 C) (09/25 0615) Pulse Rate:  [64-84] 78 (09/25 0615) Cardiac Rhythm: Junctional rhythm (09/25 0000) Resp:  [0-24] 12 (09/25 0545) BP: (94)/(66) 94/66 (09/24 0742) SpO2:  [95 %-100 %] 100 % (09/25 0615) Arterial Line BP: (80-154)/(45-83) 113/60 (09/25 0615) FiO2 (%):  [50 %-100 %] 50 % (09/25 0419) Weight:  [87.3 kg] 87.3 kg (09/25 0500)  Hemodynamic parameters for last 24 hours: PAP: (34-63)/(16-37) 50/27 CVP:  [5 mmHg-15 mmHg] 11 mmHg PCWP:  [9 mmHg] 9 mmHg CO:  [3.9 L/min-8.4 L/min] 8.4 L/min CI:  [2.02 L/min/m2-4.3 L/min/m2] 4.3 L/min/m2  Intake/Output from previous day: 09/24 0701 - 09/25 0700 In: 7306.8 [I.V.:2952.6; Blood:3534.2; NG/GT:60; IV Piggyback:760.1] Out: 2844 [Emesis/NG output:50; Stool:35; Blood:725; Chest Tube:534] Intake/Output this shift: No intake/output data recorded.  General appearance: sedated on vent Neurologic: unable to assess Heart: regular rate and rhythm, S1, S2 normal, no murmur, click, rub or gallop Lungs: some rales on left which is new, right lung sounds like aeration is better  Abdomen: soft, non-tender; bowel sounds normal; no masses,  no  organomegaly Extremities: edema mild Wound: chest dressing changed by me. The wound is intact with no further bleeding. Cleaned with H2O2 and covered with sterile gauze. Chest tube output low.  Lab Results: Recent Labs    05/31/23 1952 05/31/23 2016 06/01/23 0424 06/01/23 0433  WBC 42.7*  --  40.9*  --   HGB 8.1*   < > 9.4* 9.2*  HCT 24.8*   < > 27.7* 27.0*  PLT 72*  --  66*  --    < > = values in this interval not displayed.   BMET:  Recent Labs    05/31/23 1713 05/31/23 1800 06/01/23 0424 06/01/23 0433  NA 137   < > 137 138  K 3.8   < > 4.2 4.2  CL 106  --  102  --   CO2 21*  --  19*  --   GLUCOSE 117*  --  204*  --   BUN 17  --  18  --   CREATININE 1.26*  --  1.19  --   CALCIUM 8.9  --  8.1*  --    < > = values in this interval not displayed.    PT/INR:  Recent Labs    06/01/23 0424  LABPROT 18.9*  INR 1.6*   ABG    Component Value Date/Time   PHART 7.426 06/01/2023 0433   HCO3 21.0 06/01/2023 0433   TCO2 22 06/01/2023 0433   ACIDBASEDEF 3.0 (H) 06/01/2023 0433   O2SAT 99 06/01/2023 0433   CBG (last 3)  Recent Labs    05/31/23 2153 05/31/23 2328 06/01/23 0431  GLUCAP 150*  169* 204*   CXR: increased pulmonary edema on left. Right lung consolidation unchanged.  Assessment/Plan: S/P Procedure(s) (LRB): CONVERSION TO VV ECMO (EXTRACORPOREAL MEMBRANE OXYGENATION) (N/A) DECANNULATION FOR ECMO (N/A) TRANSESOPHAGEAL ECHOCARDIOGRAM (N/A)  POD 1  Hemodynamics stable this am. Wean vasopressors as tolerated.  Continue CRRT and start volume removal with increased PA pressure, pulmonary edema on CXR.  Hgb ok after transfusion overnight. No further bleeding and coagulopathy corrected. Plts 66 this am after transfusion. Can start bival slowly for ECMO.  Continue broad spectrum antibiotics.  TNA for nutrition with no BS and hx of ileus.  ECMO per CCM and heart failure team.   LOS: 14 days    Alleen Borne 06/01/2023

## 2023-06-01 NOTE — Inpatient Diabetes Management (Signed)
Inpatient Diabetes Program Recommendations  AACE/ADA: New Consensus Statement on Inpatient Glycemic Control (2015)  Target Ranges:  Prepandial:   less than 140 mg/dL      Peak postprandial:   less than 180 mg/dL (1-2 hours)      Critically ill patients:  140 - 180 mg/dL    Latest Reference Range & Units 05/30/23 23:57 05/31/23 04:15 05/31/23 07:44 05/31/23 12:04 05/31/23 17:05 05/31/23 18:56 05/31/23 21:53  Glucose-Capillary 70 - 99 mg/dL 098 (H) 119 (H) 147 (H) 153 (H) 104 (H) 159 (H) 150 (H)  (H): Data is abnormally high  Latest Reference Range & Units 05/31/23 23:28 06/01/23 04:31 06/01/23 07:49  Glucose-Capillary 70 - 99 mg/dL 829 (H) 562 (H) 130 (H)  (H): Data is abnormally high      Home DM Meds: Farxiga 10 mg daily   Current Orders: Novolog Moderate Correction Scale/ SSI (0-15 units) Q4 hours    MD- Note pt getting TPN 75cc/hr.  CBGs yesterday were OK but on the rise since 4am today.  Could Pharmacy add Insulin to the TPN for tonight?  If decision made not to add Insulin to the TPN, could try adding Levemir split dose: Levemir 8 units BID (0.2 units/kg)    --Will follow patient during hospitalization--  Ambrose Finland RN, MSN, CDCES Diabetes Coordinator Inpatient Glycemic Control Team Team Pager: [phone] (8a-5p)

## 2023-06-01 NOTE — Progress Notes (Signed)
EVENING ROUNDS NOTE :     [address]       [place] [address]             [phone]                 1 Day Post-Op Procedure(s) (LRB): CONVERSION TO VV ECMO (EXTRACORPOREAL MEMBRANE OXYGENATION) (N/A) DECANNULATION FOR ECMO (N/A) TRANSESOPHAGEAL ECHOCARDIOGRAM (N/A)   Total Length of Stay:  LOS: 14 days  Events:   No major events today    BP 94/66   Pulse 74   Temp 97.9 F (36.6 C)   Resp 20   Ht 5\' 6"  (1.676 m)   Wt 87.3 kg   SpO2 100%   BMI 31.06 kg/m   PAP: (42-63)/(18-37) 49/22 CVP:  [10 mmHg-17 mmHg] 11 mmHg PCWP:  [9 mmHg] 9 mmHg CO:  [6.5 L/min-8.4 L/min] 8.4 L/min CI:  [3.3 L/min/m2-4.3 L/min/m2] 4.3 L/min/m2  Vent Mode: PCV FiO2 (%):  [50 %] 50 % Set Rate:  [20 bmp-24 bmp] 20 bmp Vt Set:  [400 mL] 400 mL PEEP:  [5 cmH20-8 cmH20] 8 cmH20 Plateau Pressure:  [25 cmH20-34 cmH20] 25 cmH20    prismasol BGK 4/2.5 400 mL/hr at 06/01/23 0616   sodium chloride Stopped (05/24/23 0032)   sodium chloride 10 mL/hr at 06/01/23 0722   sodium chloride 10 mL/hr at 06/01/23 0700   albumin human Stopped (05/30/23 0819)   anticoagulant sodium citrate     bivalirudin (ANGIOMAX) 250 mg in sodium chloride 0.9 % 500 mL (0.5 mg/mL) infusion 0.003 mg/kg/hr (06/01/23 1900)   dexmedetomidine (PRECEDEX) IV infusion 0.7 mcg/kg/hr (06/01/23 1900)   epinephrine 7 mcg/min (06/01/23 1900)   HYDROmorphone 2 mg/hr (06/01/23 1900)   insulin Stopped (05/21/23 0754)   lactated ringers Stopped (05/27/23 2100)   meropenem (MERREM) IV Stopped (06/01/23 1426)   micafungin (MYCAMINE) 200 mg in sodium chloride 0.9 % 100 mL IVPB Stopped (06/01/23 0843)   midazolam 5 mg/hr (06/01/23 1900)   norepinephrine (LEVOPHED) Adult infusion Stopped (06/01/23 1405)   prismasol BGK 4/2.5 1,500 mL/hr at 06/01/23 1804   sodium bicarbonate 150 mEq in sterile water 1,150 mL infusion 200 mL/hr at 06/01/23 1730   TPN ADULT (ION) 75 mL/hr at 06/01/23 1900   vancomycin Stopped (05/31/23  2332)   vasopressin 0.04 Units/min (06/01/23 1900)    I/O last 3 completed shifts: In: 9168.1 [I.V.:4564; Blood:3534.2; NG/GT:100; IV Piggyback:969.9] Out: 5384.1 [Emesis/NG output:100; Stool:70; Blood:725; Chest Tube:629]      Latest Ref Rng & Units 06/01/2023    4:27 PM 06/01/2023    4:17 PM 06/01/2023    2:04 PM  CBC  WBC 4.0 - 10.5 K/uL  43.2    Hemoglobin 13.0 - 17.0 g/dL 9.5  9.3  9.5   Hematocrit 39.0 - 52.0 % 28.0  26.5  28.0   Platelets 150 - 400 K/uL  38         Latest Ref Rng & Units 06/01/2023    4:27 PM 06/01/2023    4:17 PM 06/01/2023    2:04 PM  BMP  Glucose 70 - 99 mg/dL  956    BUN 8 - 23 mg/dL  20    Creatinine 2.13 - 1.24 mg/dL  0.86    Sodium 578 - 469 mmol/L 137  137  137   Potassium 3.5 - 5.1 mmol/L 4.0  4.4  3.8   Chloride 98 - 111 mmol/L  97    CO2 22 - 32 mmol/L  25    Calcium 8.9 - 10.3 mg/dL  8.0      ABG    Component Value Date/Time   PHART 7.414 06/01/2023 1627   PCO2ART 48.4 (H) 06/01/2023 1627   PO2ART 175 (H) 06/01/2023 1627   HCO3 31.1 (H) 06/01/2023 1627   TCO2 33 (H) 06/01/2023 1627   ACIDBASEDEF 3.0 (H) 06/01/2023 0433   O2SAT 100 06/01/2023 1627       Brynda Greathouse, MD 06/01/2023 7:26 PM

## 2023-06-02 ENCOUNTER — Inpatient Hospital Stay (HOSPITAL_COMMUNITY): Payer: PPO

## 2023-06-02 DIAGNOSIS — T8111XA Postprocedural  cardiogenic shock, initial encounter: Secondary | ICD-10-CM | POA: Diagnosis not present

## 2023-06-02 DIAGNOSIS — R570 Cardiogenic shock: Secondary | ICD-10-CM | POA: Diagnosis not present

## 2023-06-02 DIAGNOSIS — J9601 Acute respiratory failure with hypoxia: Secondary | ICD-10-CM | POA: Diagnosis not present

## 2023-06-02 LAB — POCT I-STAT 7, (LYTES, BLD GAS, ICA,H+H)
Acid-Base Excess: 8 mmol/L — ABNORMAL HIGH (ref 0.0–2.0)
Acid-Base Excess: 9 mmol/L — ABNORMAL HIGH (ref 0.0–2.0)
Acid-Base Excess: 6 mmol/L — ABNORMAL HIGH (ref 0.0–2.0)
Acid-Base Excess: 4 mmol/L — ABNORMAL HIGH (ref 0.0–2.0)
Acid-Base Excess: 5 mmol/L — ABNORMAL HIGH (ref 0.0–2.0)
Acid-Base Excess: 5 mmol/L — ABNORMAL HIGH (ref 0.0–2.0)
Bicarbonate: 33.3 mmol/L — ABNORMAL HIGH (ref 20.0–28.0)
Bicarbonate: 34.3 mmol/L — ABNORMAL HIGH (ref 20.0–28.0)
Bicarbonate: 31.9 mmol/L — ABNORMAL HIGH (ref 20.0–28.0)
Bicarbonate: 30.4 mmol/L — ABNORMAL HIGH (ref 20.0–28.0)
Bicarbonate: 29.8 mmol/L — ABNORMAL HIGH (ref 20.0–28.0)
Bicarbonate: 28.8 mmol/L — ABNORMAL HIGH (ref 20.0–28.0)
Calcium, Ion: 1.1 mmol/L — ABNORMAL LOW (ref 1.15–1.40)
Calcium, Ion: 1.09 mmol/L — ABNORMAL LOW (ref 1.15–1.40)
Calcium, Ion: 1.1 mmol/L — ABNORMAL LOW (ref 1.15–1.40)
Calcium, Ion: 1.1 mmol/L — ABNORMAL LOW (ref 1.15–1.40)
Calcium, Ion: 1.07 mmol/L — ABNORMAL LOW (ref 1.15–1.40)
Calcium, Ion: 1.07 mmol/L — ABNORMAL LOW (ref 1.15–1.40)
HCT: 26 % — ABNORMAL LOW (ref 39.0–52.0)
HCT: 26 % — ABNORMAL LOW (ref 39.0–52.0)
HCT: 27 % — ABNORMAL LOW (ref 39.0–52.0)
HCT: 28 % — ABNORMAL LOW (ref 39.0–52.0)
HCT: 25 % — ABNORMAL LOW (ref 39.0–52.0)
HCT: 25 % — ABNORMAL LOW (ref 39.0–52.0)
Hemoglobin: 8.8 g/dL — ABNORMAL LOW (ref 13.0–17.0)
Hemoglobin: 8.8 g/dL — ABNORMAL LOW (ref 13.0–17.0)
Hemoglobin: 9.2 g/dL — ABNORMAL LOW (ref 13.0–17.0)
Hemoglobin: 9.5 g/dL — ABNORMAL LOW (ref 13.0–17.0)
Hemoglobin: 8.5 g/dL — ABNORMAL LOW (ref 13.0–17.0)
Hemoglobin: 8.5 g/dL — ABNORMAL LOW (ref 13.0–17.0)
O2 Saturation: 100 %
O2 Saturation: 100 %
O2 Saturation: 99 %
O2 Saturation: 99 %
O2 Saturation: 100 %
O2 Saturation: 99 %
Patient temperature: 36.6
Patient temperature: 36.5
Patient temperature: 36.7
Patient temperature: 36.7
Patient temperature: 36.7
Patient temperature: 36.7
Potassium: 4 mmol/L (ref 3.5–5.1)
Potassium: 4.1 mmol/L (ref 3.5–5.1)
Potassium: 4.1 mmol/L (ref 3.5–5.1)
Potassium: 4.3 mmol/L (ref 3.5–5.1)
Potassium: 4.3 mmol/L (ref 3.5–5.1)
Potassium: 4.3 mmol/L (ref 3.5–5.1)
Sodium: 135 mmol/L (ref 135–145)
Sodium: 136 mmol/L (ref 135–145)
Sodium: 134 mmol/L — ABNORMAL LOW (ref 135–145)
Sodium: 135 mmol/L (ref 135–145)
Sodium: 136 mmol/L (ref 135–145)
Sodium: 136 mmol/L (ref 135–145)
TCO2: 35 mmol/L — ABNORMAL HIGH (ref 22–32)
TCO2: 36 mmol/L — ABNORMAL HIGH (ref 22–32)
TCO2: 33 mmol/L — ABNORMAL HIGH (ref 22–32)
TCO2: 32 mmol/L (ref 22–32)
TCO2: 31 mmol/L (ref 22–32)
TCO2: 30 mmol/L (ref 22–32)
pCO2 arterial: 50.6 mmHg — ABNORMAL HIGH (ref 32–48)
pCO2 arterial: 51.8 mmHg — ABNORMAL HIGH (ref 32–48)
pCO2 arterial: 51.9 mmHg — ABNORMAL HIGH (ref 32–48)
pCO2 arterial: 54.6 mmHg — ABNORMAL HIGH (ref 32–48)
pCO2 arterial: 42.9 mmHg (ref 32–48)
pCO2 arterial: 40.4 mmHg (ref 32–48)
pH, Arterial: 7.425 (ref 7.35–7.45)
pH, Arterial: 7.427 (ref 7.35–7.45)
pH, Arterial: 7.396 (ref 7.35–7.45)
pH, Arterial: 7.353 (ref 7.35–7.45)
pH, Arterial: 7.448 (ref 7.35–7.45)
pH, Arterial: 7.461 — ABNORMAL HIGH (ref 7.35–7.45)
pO2, Arterial: 173 mmHg — ABNORMAL HIGH (ref 83–108)
pO2, Arterial: 183 mmHg — ABNORMAL HIGH (ref 83–108)
pO2, Arterial: 155 mmHg — ABNORMAL HIGH (ref 83–108)
pO2, Arterial: 166 mmHg — ABNORMAL HIGH (ref 83–108)
pO2, Arterial: 167 mmHg — ABNORMAL HIGH (ref 83–108)
pO2, Arterial: 151 mmHg — ABNORMAL HIGH (ref 83–108)

## 2023-06-02 LAB — PREPARE PLATELET PHERESIS
Unit division: 0
Unit division: 0

## 2023-06-02 LAB — BPAM RBC
Blood Product Expiration Date: 202410222359
Blood Product Expiration Date: 202410272359
Blood Product Expiration Date: 202410272359
Blood Product Expiration Date: 202410282359
Blood Product Expiration Date: 202410292359
Blood Product Expiration Date: 202410292359
Blood Product Expiration Date: 202410292359
Blood Product Expiration Date: 202410272359
Blood Product Expiration Date: 202410292359
Blood Product Expiration Date: 202410292359
Blood Product Expiration Date: 202410292359
ISSUE DATE / TIME: 202409241355
ISSUE DATE / TIME: 202409242222
ISSUE DATE / TIME: 202409251541
ISSUE DATE / TIME: 202410222359
ISSUE DATE / TIME: 202409241355
Unit Type and Rh: 7300
Unit Type and Rh: 7300
Unit Type and Rh: 7300
Unit Type and Rh: 7300
Unit Type and Rh: 7300
Unit Type and Rh: 7300
Unit Type and Rh: 7300
Unit Type and Rh: 7300
Unit Type and Rh: 7300
Unit Type and Rh: 7300
Unit Type and Rh: 7300
Unit Type and Rh: 7300
Unit Type and Rh: 7300
Unit Type and Rh: 7300
Unit Type and Rh: 7300
Unit Type and Rh: 7300
Unit Type and Rh: 7300
Unit Type and Rh: 7300

## 2023-06-02 LAB — TYPE AND SCREEN
ABO/RH(D): B POS
Antibody Screen: NEGATIVE
Unit division: 0
Unit division: 0
Unit division: 0
Unit division: 0
Unit division: 0
Unit division: 0
Unit division: 0
Unit division: 0
Unit division: 0
Unit division: 0
Unit division: 0
Unit division: 0
Unit division: 0
Unit division: 0

## 2023-06-02 LAB — CBC
HCT: 25.5 % — ABNORMAL LOW (ref 39.0–52.0)
HCT: 25.8 % — ABNORMAL LOW (ref 39.0–52.0)
Hemoglobin: 8.7 g/dL — ABNORMAL LOW (ref 13.0–17.0)
Hemoglobin: 8.7 g/dL — ABNORMAL LOW (ref 13.0–17.0)
MCH: 29.4 pg (ref 26.0–34.0)
MCH: 29.9 pg (ref 26.0–34.0)
MCHC: 34.1 g/dL (ref 30.0–36.0)
MCHC: 33.7 g/dL (ref 30.0–36.0)
MCV: 86.1 fL (ref 80.0–100.0)
MCV: 88.7 fL (ref 80.0–100.0)
Platelets: 26 10*3/uL — CL (ref 150–400)
Platelets: 21 10*3/uL — CL (ref 150–400)
RBC: 2.96 MIL/uL — ABNORMAL LOW (ref 4.22–5.81)
RBC: 2.91 MIL/uL — ABNORMAL LOW (ref 4.22–5.81)
RDW: 17.3 % — ABNORMAL HIGH (ref 11.5–15.5)
RDW: 17.6 % — ABNORMAL HIGH (ref 11.5–15.5)
WBC: 40.1 10*3/uL — ABNORMAL HIGH (ref 4.0–10.5)
WBC: 45 10*3/uL — ABNORMAL HIGH (ref 4.0–10.5)
nRBC: 1.4 % — ABNORMAL HIGH (ref 0.0–0.2)
nRBC: 1.3 % — ABNORMAL HIGH (ref 0.0–0.2)

## 2023-06-02 LAB — BPAM PLATELET PHERESIS
Blood Product Expiration Date: 202409282359
Blood Product Expiration Date: 202409282359
ISSUE DATE / TIME: 202409261734
ISSUE DATE / TIME: 202409261734
Unit Type and Rh: 5100
Unit Type and Rh: 5100

## 2023-06-02 LAB — PROTIME-INR
INR: 1.5 — ABNORMAL HIGH (ref 0.8–1.2)
Prothrombin Time: 18 seconds — ABNORMAL HIGH (ref 11.4–15.2)

## 2023-06-02 LAB — COMPREHENSIVE METABOLIC PANEL
ALT: 10 U/L (ref 0–44)
AST: 112 U/L — ABNORMAL HIGH (ref 15–41)
Albumin: 1.8 g/dL — ABNORMAL LOW (ref 3.5–5.0)
Alkaline Phosphatase: 84 U/L (ref 38–126)
Anion gap: 13 (ref 5–15)
BUN: 23 mg/dL (ref 8–23)
CO2: 27 mmol/L (ref 22–32)
Calcium: 8 mg/dL — ABNORMAL LOW (ref 8.9–10.3)
Chloride: 96 mmol/L — ABNORMAL LOW (ref 98–111)
Creatinine, Ser: 0.98 mg/dL (ref 0.61–1.24)
GFR, Estimated: >60 mL/min (ref >60)
Glucose, Bld: 202 mg/dL — ABNORMAL HIGH (ref 70–99)
Potassium: 4.1 mmol/L (ref 3.5–5.1)
Sodium: 136 mmol/L (ref 135–145)
Total Bilirubin: 4.5 mg/dL — ABNORMAL HIGH (ref 0.3–1.2)
Total Protein: 6 g/dL — ABNORMAL LOW (ref 6.5–8.1)

## 2023-06-02 LAB — GLUCOSE, CAPILLARY
Glucose-Capillary: 173 mg/dL — ABNORMAL HIGH (ref 70–99)
Glucose-Capillary: 188 mg/dL — ABNORMAL HIGH (ref 70–99)
Glucose-Capillary: 193 mg/dL — ABNORMAL HIGH (ref 70–99)
Glucose-Capillary: 200 mg/dL — ABNORMAL HIGH (ref 70–99)
Glucose-Capillary: 204 mg/dL — ABNORMAL HIGH (ref 70–99)
Glucose-Capillary: 215 mg/dL — ABNORMAL HIGH (ref 70–99)

## 2023-06-02 LAB — RENAL FUNCTION PANEL
Albumin: 1.8 g/dL — ABNORMAL LOW (ref 3.5–5.0)
Albumin: 1.8 g/dL — ABNORMAL LOW (ref 3.5–5.0)
Anion gap: 6 (ref 5–15)
Anion gap: 14 (ref 5–15)
BUN: 23 mg/dL (ref 8–23)
BUN: 23 mg/dL (ref 8–23)
CO2: 32 mmol/L (ref 22–32)
CO2: 24 mmol/L (ref 22–32)
Calcium: 7.8 mg/dL — ABNORMAL LOW (ref 8.9–10.3)
Calcium: 7.7 mg/dL — ABNORMAL LOW (ref 8.9–10.3)
Chloride: 96 mmol/L — ABNORMAL LOW (ref 98–111)
Chloride: 97 mmol/L — ABNORMAL LOW (ref 98–111)
Creatinine, Ser: 1.03 mg/dL (ref 0.61–1.24)
Creatinine, Ser: 0.92 mg/dL (ref 0.61–1.24)
GFR, Estimated: >60 mL/min (ref >60)
GFR, Estimated: >60 mL/min (ref >60)
Glucose, Bld: 202 mg/dL — ABNORMAL HIGH (ref 70–99)
Glucose, Bld: 226 mg/dL — ABNORMAL HIGH (ref 70–99)
Phosphorus: 2.2 mg/dL — ABNORMAL LOW (ref 2.5–4.6)
Phosphorus: 4.3 mg/dL (ref 2.5–4.6)
Potassium: 4.1 mmol/L (ref 3.5–5.1)
Potassium: 4.6 mmol/L (ref 3.5–5.1)
Sodium: 134 mmol/L — ABNORMAL LOW (ref 135–145)
Sodium: 135 mmol/L (ref 135–145)

## 2023-06-02 LAB — LACTIC ACID, PLASMA: Lactic Acid, Venous: 2.2 mmol/L (ref 0.5–1.9)

## 2023-06-02 LAB — CULTURE, RESPIRATORY W GRAM STAIN

## 2023-06-02 LAB — MAGNESIUM: Magnesium: 2.1 mg/dL (ref 1.7–2.4)

## 2023-06-02 LAB — APTT
aPTT: 53 seconds — ABNORMAL HIGH (ref 24–36)
aPTT: 53 seconds — ABNORMAL HIGH (ref 24–36)

## 2023-06-02 LAB — PHOSPHORUS: Phosphorus: 2.2 mg/dL — ABNORMAL LOW (ref 2.5–4.6)

## 2023-06-02 LAB — COOXEMETRY PANEL
Carboxyhemoglobin: 3 % — ABNORMAL HIGH (ref 0.5–1.5)
Methemoglobin: 1.1 % (ref 0.0–1.5)
O2 Saturation: 64.3 %
Total hemoglobin: 9.1 g/dL — ABNORMAL LOW (ref 12.0–16.0)

## 2023-06-02 LAB — FIBRINOGEN: Fibrinogen: 483 mg/dL — ABNORMAL HIGH (ref 210–475)

## 2023-06-02 LAB — LACTATE DEHYDROGENASE: LDH: 1333 U/L — ABNORMAL HIGH (ref 98–192)

## 2023-06-02 LAB — CG4 I-STAT (LACTIC ACID): Lactic Acid, Venous: 1.9 mmol/L (ref 0.5–1.9)

## 2023-06-02 MED ORDER — PRISMASOL BGK 4/2.5 32-4-2.5 MEQ/L REPLACEMENT SOLN: Status: DC

## 2023-06-02 MED ORDER — OXIDIZED CELLULOSE EX PADS
2.0000 | MEDICATED_PAD | CUTANEOUS | Status: DC | PRN
  Administered 2023-06-03: 2 via TOPICAL
  Filled 2023-06-02 (×3): qty 2

## 2023-06-02 MED ORDER — INSULIN ASPART 100 UNIT/ML IJ SOLN
0.0000 [IU] | INTRAMUSCULAR | Status: DC
  Administered 2023-06-02: 7 [IU] via SUBCUTANEOUS
  Administered 2023-06-02 (×2): 4 [IU] via SUBCUTANEOUS
  Administered 2023-06-03 (×2): 7 [IU] via SUBCUTANEOUS
  Administered 2023-06-03: 3 [IU] via SUBCUTANEOUS
  Administered 2023-06-03 (×3): 7 [IU] via SUBCUTANEOUS
  Administered 2023-06-04 (×2): 4 [IU] via SUBCUTANEOUS
  Administered 2023-06-04: 7 [IU] via SUBCUTANEOUS
  Administered 2023-06-04 (×2): 11 [IU] via SUBCUTANEOUS
  Administered 2023-06-04: 4 [IU] via SUBCUTANEOUS
  Administered 2023-06-04 – 2023-06-05 (×5): 7 [IU] via SUBCUTANEOUS
  Administered 2023-06-05 – 2023-06-06 (×5): 4 [IU] via SUBCUTANEOUS
  Administered 2023-06-06: 7 [IU] via SUBCUTANEOUS
  Administered 2023-06-06 (×2): 4 [IU] via SUBCUTANEOUS
  Administered 2023-06-07: 7 [IU] via SUBCUTANEOUS
  Administered 2023-06-07: 4 [IU] via SUBCUTANEOUS
  Administered 2023-06-07: 11 [IU] via SUBCUTANEOUS
  Administered 2023-06-07 (×2): 4 [IU] via SUBCUTANEOUS
  Administered 2023-06-08: 11 [IU] via SUBCUTANEOUS
  Administered 2023-06-08 (×2): 7 [IU] via SUBCUTANEOUS
  Administered 2023-06-08: 11 [IU] via SUBCUTANEOUS
  Administered 2023-06-08 (×2): 7 [IU] via SUBCUTANEOUS
  Administered 2023-06-08: 15 [IU] via SUBCUTANEOUS
  Administered 2023-06-09 (×2): 7 [IU] via SUBCUTANEOUS
  Administered 2023-06-09 (×2): 11 [IU] via SUBCUTANEOUS
  Administered 2023-06-09: 7 [IU] via SUBCUTANEOUS
  Administered 2023-06-10 (×2): 4 [IU] via SUBCUTANEOUS

## 2023-06-02 MED ORDER — SODIUM PHOSPHATES 45 MMOLE/15ML IV SOLN
30.0000 mmol | Freq: Once | INTRAVENOUS | Status: AC
  Administered 2023-06-02: 30 mmol via INTRAVENOUS
  Filled 2023-06-02: qty 10

## 2023-06-02 MED ORDER — AMIODARONE IV BOLUS ONLY 150 MG/100ML
150.0000 mg | Freq: Once | INTRAVENOUS | Status: AC
  Administered 2023-06-02: 150 mg via INTRAVENOUS
  Filled 2023-06-02: qty 100

## 2023-06-02 MED ORDER — TRACE MINERALS CU-MN-SE-ZN 300-55-60-3000 MCG/ML IV SOLN
INTRAVENOUS | Status: AC
  Filled 2023-06-02: qty 876

## 2023-06-02 MED ORDER — OXIDIZED CELLULOSE EX PADS
1.0000 | MEDICATED_PAD | Freq: Once | CUTANEOUS | Status: AC
  Administered 2023-06-02: 1 via TOPICAL
  Filled 2023-06-02: qty 1

## 2023-06-02 MED ORDER — SODIUM CHLORIDE 0.9% IV SOLUTION: Freq: Once | INTRAVENOUS | Status: DC

## 2023-06-02 MED ORDER — INSULIN GLARGINE-YFGN 100 UNIT/ML ~~LOC~~ SOLN
15.0000 [IU] | Freq: Every day | SUBCUTANEOUS | Status: DC
  Administered 2023-06-02: 15 [IU] via SUBCUTANEOUS
  Filled 2023-06-02 (×2): qty 0.15

## 2023-06-02 NOTE — Progress Notes (Signed)
2 Days Post-Op Procedure(s) (LRB): CONVERSION TO VV ECMO (EXTRACORPOREAL MEMBRANE OXYGENATION) (N/A) DECANNULATION FOR ECMO (N/A) TRANSESOPHAGEAL ECHOCARDIOGRAM (N/A) Subjective:  Hemodynamics stable overnight. NE is off. Epi down to 5, vaso 0.04.  Co-ox is 64% PA 47/27, CVP 13.  -1274 cc for 24 hrs on CRRT.  Remains on vent. Only on Precedex now. Reportedly flinching eyes a little but nothing else so far.   Objective: Vital signs in last 24 hours: Temp:  [97.7 F (36.5 C)-98.1 F (36.7 C)] 97.7 F (36.5 C) (09/26 0615) Pulse Rate:  [71-78] 72 (09/26 0615) Cardiac Rhythm: Junctional rhythm (09/26 0400) Resp:  [8-24] 20 (09/26 0615) SpO2:  [100 %] 100 % (09/26 0615) Arterial Line BP: (99-205)/(49-122) 110/58 (09/26 0615) FiO2 (%):  [50 %] 50 % (09/26 0400) Weight:  [86.3 kg] 86.3 kg (09/26 0500)  Hemodynamic parameters for last 24 hours: PAP: (43-57)/(20-29) 47/27 CVP:  [10 mmHg-17 mmHg] 13 mmHg  Intake/Output from previous day: 09/25 0701 - 09/26 0700 In: 3754.1 [I.V.:3064.4; NG/GT:80; IV Piggyback:609.7] Out: 5028.1 [Emesis/NG output:200; Stool:70; Chest Tube:75] Intake/Output this shift: Total I/O In: 1882.9 [I.V.:1443; NG/GT:40; IV Piggyback:399.9] Out: 2698 [Emesis/NG output:150; Stool:35; Chest Tube:40]  General appearance: intubated on vent Neurologic: unable to assess Heart: regular rate and rhythm, S1, S2 normal, no murmur Lungs: tubular on right, rales on left. Abdomen: soft, no bowel sounds. Extremities: edema mild Wound: chest dressing dry.  Lab Results: Recent Labs    06/01/23 1617 06/01/23 1627 06/02/23 0403 06/02/23 0433  WBC 43.2*  --  40.1*  --   HGB 9.3*   < > 8.7* 8.8*  HCT 26.5*   < > 25.5* 26.0*  PLT 38*  --  26*  --    < > = values in this interval not displayed.   BMET:  Recent Labs    06/01/23 1617 06/01/23 1627 06/02/23 0403 06/02/23 0433  NA 137   < > 136  134* 135  K 4.4   < > 4.1  4.1 4.0  CL 97*  --  96*  96*  --    CO2 25  --  27  32  --   GLUCOSE 206*  --  202*  202*  --   BUN 20  --  23  23  --   CREATININE 1.11  --  0.98  1.03  --   CALCIUM 8.0*  --  8.0*  7.8*  --    < > = values in this interval not displayed.    PT/INR:  Recent Labs    06/02/23 0403  LABPROT 18.0*  INR 1.5*   ABG    Component Value Date/Time   PHART 7.425 06/02/2023 0433   HCO3 33.3 (H) 06/02/2023 0433   TCO2 35 (H) 06/02/2023 0433   ACIDBASEDEF 3.0 (H) 06/01/2023 0433   O2SAT 100 06/02/2023 0433   CBG (last 3)  Recent Labs    06/01/23 1947 06/02/23 0000 06/02/23 0358  GLUCAP 207* 200* 204*   CXR: unchanged with right lung consolidation and bilateral edema.  Assessment/Plan: S/P Procedure(s) (LRB): CONVERSION TO VV ECMO (EXTRACORPOREAL MEMBRANE OXYGENATION) (N/A) DECANNULATION FOR ECMO (N/A) TRANSESOPHAGEAL ECHOCARDIOGRAM (N/A)  Hemodynamics stable on lower pressor requirement. Lactate trended down to 2.2 this am.   Leukocytosis still 40K but trending down.  Thrombocytopenia: plts trending down again but no bleeding.   Continue bival for VV ECMO.  Keep chest tubes in today. Will probably remove MT's tomorrow and keep pleural tubes in.  Continue broad spectrum antibiotics  and antifungal.  TNA for nutrition until gut function returns.     LOS: 15 days    Alleen Borne 06/02/2023

## 2023-06-02 NOTE — Progress Notes (Signed)
ANTICOAGULATION CONSULT NOTE - Follow up Consult  Pharmacy Consult for bivalirudin Indication:  VV ECMO  Allergies  Allergen Reactions   Other Anaphylaxis    Mushrooms  Not listed on MAR    Plavix [Clopidogrel Bisulfate] Other (See Comments)    TTP Not listed on the [facility]   Fleet Enema [Enema] Other (See Comments)    Unknown reaction   Lovenox [Enoxaparin] Other (See Comments)    Unknown reaction   Morphine Other (See Comments)    Unknown reaction   Nsaids Other (See Comments)    Unknown reaction   Hydrocodone-Acetaminophen Itching    Patient Measurements: Height: 5\' 6"  (167.6 cm) Weight: 86.3 kg (190 lb 4.1 oz) IBW/kg (Calculated) : 63.8 Heparin Dosing Weight: 81.3 kg  Vital Signs: Temp: 97.9 F (36.6 C) (09/26 1400) Temp Source: Core (09/26 1400) Pulse Rate: 72 (09/26 1400)  Labs: Recent Labs    05/31/23 0418 05/31/23 0747 06/01/23 0424 06/01/23 0433 06/01/23 1357 06/01/23 1404 06/01/23 1617 06/01/23 1627 06/02/23 0403 06/02/23 0433 06/02/23 1207 06/02/23 1553 06/02/23 1601  HGB 8.3*   < > 9.4*   < >  --    < > 9.3*   < > 8.7*   < > 9.2* 8.7* 9.5*  HCT 25.3*   < > 27.7*   < >  --    < > 26.5*   < > 25.5*   < > 27.0* 25.8* 28.0*  PLT 23*   < > 66*  --   --   --  38*  --  26*  --   --  21*  --   APTT 67*  --   --   --  51*  --   --   --  53*  --   --  53*  --   LABPROT 17.7*  --  18.9*  --   --   --   --   --  18.0*  --   --   --   --   INR 1.4*  --  1.6*  --   --   --   --   --  1.5*  --   --   --   --   CREATININE 1.06   < > 1.19  --   --   --  1.11  --  0.98  1.03  --   --   --   --    < > = values in this interval not displayed.    Estimated Creatinine Clearance: 75.6 mL/min (by C-G formula based on SCr of 1.03 mg/dL).   Assessment: 62 yom underwent Bentall/AVR, TV repair, re-implantation of SVG-RCA complicated with vasoplegia and pulmonary edema requiring central VA ECMO.   On 9/24 patient underwent transition VA to VV ECMO. Bivalirudin  continuous infusion was held prior to procedure. Patient received a total of 5 units of PRBC, 5 units of platelets, and 2 units of FFP. Hgb stable at 9.5 and Plt up to 66. Of note, CRRT was stopped for procedure (~6 hours) but resumed upon arrival back to the floor at 1800.  No AC PTA.  Resumed bivalirudin the morning after arriving back from the OR following ECMO VA > VV.   Received multiple blood products 9/24 and 2 units PRBC 9/26. Slight oozing from R internal jugular site but stable. Single small clot in circuit. No fibrin streaks. LDH 1076 and fibrinogen 482, both elevated but stable.  aPTT remains therapeutic at 53 sec on bivalirudin  0.003 mg/kg/hour. Single small clot in circuit. No fibrin streaks. LDH up to 1333 and fibrinogen 463, both elevated but stable.   Goal of Therapy:  aPTT 50-70 seconds Monitor platelets by anticoagulation protocol: Yes   Plan:  Continue bivalirudin at 0.003 mg/kg/hr  Monitor q12 hr aPTT and CBC, LDH, fibrinogen and for s/sx of bleeding.  Trixie Rude, PharmD Clinical Pharmacist 06/02/2023  5:31 PM

## 2023-06-02 NOTE — Progress Notes (Signed)
NAME:  [name], MRN:  [id number], DOB:  16-Mar-1961, LOS: 15 ADMISSION DATE:  05/21/2023, CONSULTATION DATE:  9/11 REFERRING MD:  Dr. Izora Ribas, CHIEF COMPLAINT:  aortic valve dehiscence   History of Present Illness:  Patient is [name] w/ pertinent PMH CAD s/p CABG 2017 w/ AVR, HLD, HTN, prior CVA, T2DM, CKD4 was previously on HD presents to [facility] on 9/10 chest pain.  Patient recently admitted to [facility] on 5/25 w/ AKI and AMS. While in ED patient had PEA arrest w/ ROSC in about 8 minutes. Patient required dialysis on this admission. Also w/ MSSA bacteremia associated w/ tricuspid valve endocarditis. Patient transferred to [facility] on 5/25. Treated w/ 6 week course oxacillin and rifampin. This admission also suffered a R cerebellar CVA w/ MRI likely septic emboli and had cholangitis/pancreatitis requiring ERCP.  On 9/10 patient admitted to [facility] w/ chest pain, dyspnea, and BLE edema. BNP 2,097. CXR w/ pulm edema. Patient started on IV lasix infusion. Cards consulted. On 9/11 patient transferred to [facility]. Patient's echo showing possible aortic valve dehiscence. Cultures repeated and started on rocephin. Patient transferred to ICU for TEE and general anesthesia and to start CRRT. PCCM consulted.  Pertinent  Medical History   Past Medical History:  Diagnosis Date   Allergy    Anemia    Anxiety    Baker's cyst of knee    Blood transfusion without reported diagnosis    as baby    Coronary artery disease    quadruple bypass - March 2016   GERD (gastroesophageal reflux disease)    Gouty arthritis    "real bad" (01/17/2013)   Heart murmur    Hypercholesteremia    Hypertension    MSSA bacteremia 01/29/2023   Myocardial infarction (HCC) 2017   PEA (Pulseless electrical activity) (HCC) 01/29/2023   Stroke (HCC)    Type II diabetes mellitus (HCC)     Significant Hospital Events: Including procedures, antibiotic start and stop dates in addition to other pertinent events    9/10 admitted 9/11 echo showing aortic valve dehiscence, confirmed by TEE.  9/11 started (back) on CRRT for volume overload and known CKD 4 9/12 breathing better after CRRT 9/13 to OR for Redo of aortic valve, tricuspid valve repair and ascending aortic root replacement w/ re-implantation of SVG to RCA. Post op TEE EF 55% RV mid-mod HK, AVR/TV repair stable. Could not come off CPB. Worse pulmonary edema. High dose pressors. Cannulated and started on VA ECMO w/ central cannulation  9/16 MDT ECMO rounds this AM, plans to remove more volume, 1 U PRBCs  9/18 bronch, clot and mucus plugs removed  9/19 OR for washout  9/20 bronch 9/21 bronch 9/24 VA to VV, some bleeding and acidemia issues postop 9/25 ett exchange and bronch  Interim History / Subjective:  Improved pressor needs today. Changed to rest settings on vent.  Objective   Blood pressure 94/66, pulse 72, temperature 97.7 F (36.5 C), temperature source Core, resp. rate 19, height 5\' 6"  (1.676 m), weight 86.3 kg, SpO2 100%. PAP: (43-57)/(20-29) 49/25 CVP:  [10 mmHg-17 mmHg] 14 mmHg  Vent Mode: PCV FiO2 (%):  [50 %] 50 % Set Rate:  [20 bmp-24 bmp] 24 bmp Vt Set:  [400 mL] 400 mL PEEP:  [8 cmH20-10 cmH20] 10 cmH20 Plateau Pressure:  [25 cmH20-26 cmH20] 26 cmH20   Intake/Output Summary (Last 24 hours) at 06/02/2023 0945 Last data filed at 06/02/2023 0800 Gross per 24 hour  Intake 3617.47  ml  Output 5123.3 ml  Net -1505.83 ml   Filed Weights   06/03/2023 0500 06/01/23 0500 06/02/23 0500  Weight: 84.7 kg 87.3 kg 86.3 kg    Examination: On vent unresponsive, sedation has been turned off Sternotomy dressed without strikethrough VV ECMO circuit and insertion site looks okay ETT minimal thick secretions Ext 1+ edema Abd soft, still no bowel sounds  ABG looks okay, coming down on sweep CXR worsened aeration probably due to rest settings + edema LDH up a bit Plts drifting back down WBC improved slightly  Assessment & Plan:    Post cardiotomy vasoplegia/cardioplegia w/ failure to wean from CPB- improved; 9/24 converted to VV Acute on chronic combined HF, Hx MSSA endocarditis- S/p AVR/Bentall and reimplantation of Coronaries and TV repair 9/13 Acute respiratory failure w/ hypoxia due to volume overload, with untreated OSA, and dense nonresolving R infiltrate CKD 4. Had recently been on HD- now on CRRT Circuit related hemolysis, thrombocytopenia consumptive- stable TF intolerance- ongoing issue now on TPN CAD s/p CABG in 2017 w/ AVR HTN HLD Hx of CVA DMT2 Leukocytosis, multifactorial   - VV ECMO for pH 7.35-7.45 - Rest settings on vent, going to push PEEP up a bit and work on postural drainage (PEEP increase yesterday accompanied by worsening shock) - CRRT pull today - Pressors for MAP 65 - TPN per pharmD, NGT to LIS - Broad spectrum abx, duration TBD - Insulin PRN - Hold sedation - Bival for AC, plts only if bleeding - If no improvement in CXR and mentation tomorrow need to consider CT chest and CT head - Discussed above with AHF+TCTS+ECMO specialists  Best Practice (right click and "Reselect all SmartList Selections" daily)   Diet/type: TPN DVT prophylaxis: bival GI prophylaxis: PPI Lines: Central line and Dialysis Catheter Foley: yes Code Status:  full code Last date of multidisciplinary goals of care discussion [per TCTS and AHF]  37 min cc time Myrla Halsted MD [facility] 06/02/2023 9:45 AM

## 2023-06-02 NOTE — Progress Notes (Signed)
Patient ID: [name], male   DOB: August 21, 1961, 62 y.o.   MRN: [id number]     Advanced Heart Failure Rounding Note  PCP-Cardiologist: Kristeen Miss, MD   Subjective:    9/13: OR for Bentall/AVR, TV repair, re-implantation of SVG-RCA.  Post-op vasoplegia and pulmonary edema, required central VA ECMO (RA drainage, ascending aorta return.  Post-op TEE EF 55%, mild-moderate RV hypokinesis.  9/14: Blender added to circuit due to high PaO2 9/16: TEE with EF 30%, moderate LVH, moderate RV dysfunction with mild enlargement, stable bioprosthetic aortic valve, repaired TV with mild TR, no endocarditis noted.  9/18: Bronchoscopy with mucus plugs and clots. 9/19: To OR for mediastinal washout.  By TEE, EF 50% with moderate LVH, mild RV enlargement and mild RV dysfunction, stable bioprosthetic aortic valve, repaired TV with mild TR.  He did not tolerate lowering of ECMO flows even with increasing of pressors due to vasoplegia.  9/21: Bronchoscopy 9/24: Decannulated from [facility] ECMO. Chest closed. Transitioned to VV ECMO   On VV ECMO. Circuit stable. On low dose bival. No bleeding.   Remains intubated and sedated. Underwent bronch - diffuse R-sided secretions.   Remains on epi 9. VP 0.04. NE off. On CVVHD pulling -50-100.  On TPN   PLTs 26K WBC 40.1K   Objective:   Weight Range: 86.3 kg Body mass index is 30.71 kg/m.   Vital Signs:   Temp:  [97.7 F (36.5 C)-97.9 F (36.6 C)] 97.9 F (36.6 C) (09/26 1400) Pulse Rate:  [71-75] 72 (09/26 1400) Resp:  [0-24] 0 (09/26 1400) SpO2:  [100 %] 100 % (09/26 1400) Arterial Line BP: (99-205)/(49-122) 105/52 (09/26 1400) FiO2 (%):  [50 %] 50 % (09/26 1104) Weight:  [86.3 kg] 86.3 kg (09/26 0500) Last BM Date : 06/01/23  Weight change: Filed Weights   05/24/2023 0500 06/01/23 0500 06/02/23 0500  Weight: 84.7 kg 87.3 kg 86.3 kg    Intake/Output:   Intake/Output Summary (Last 24 hours) at 06/02/2023 1513 Last data filed at 06/02/2023 1400 Gross per  24 hour  Intake 3745.08 ml  Output 5332.4 ml  Net -1587.32 ml      Physical Exam    General:  Critically-ill appearing. On vent HEENT: + ETT Neck:  +VV ECMO cannula + swan General:  Well appearing. No resp difficulty HEENT: normal Cor: Sternal dressing ok. + CTs. Regular rate & rhythm. No rubs, gallops or murmurs. Lungs: coarse R>L Abdomen: soft, nontender, nondistended. No hepatosplenomegaly. No bruits or masses. Good bowel sounds. Extremities: no cyanosis, clubbing, rash, 1+ edema Neuro: intubated/sedated   Telemetry   Junctional rhythm + PVCs Personally reviewed  Labs    CBC Recent Labs    06/01/23 1617 06/01/23 1627 06/02/23 0403 06/02/23 0433 06/02/23 0801 06/02/23 1207  WBC 43.2*  --  40.1*  --   --   --   HGB 9.3*   < > 8.7*   < > 8.8* 9.2*  HCT 26.5*   < > 25.5*   < > 26.0* 27.0*  MCV 85.8  --  86.1  --   --   --   PLT 38*  --  26*  --   --   --    < > = values in this interval not displayed.   Basic Metabolic Panel Recent Labs    46/96/29 0424 06/01/23 0433 06/01/23 1617 06/01/23 1627 06/02/23 0403 06/02/23 0433 06/02/23 0801 06/02/23 1207  NA 137   < > 137   < > 136  134*   < >  136 134*  K 4.2   < > 4.4   < > 4.1  4.1   < > 4.1 4.1  CL 102  --  97*  --  96*  96*  --   --   --   CO2 19*  --  25  --  27  32  --   --   --   GLUCOSE 204*  --  206*  --  202*  202*  --   --   --   BUN 18  --  20  --  23  23  --   --   --   CREATININE 1.19  --  1.11  --  0.98  1.03  --   --   --   CALCIUM 8.1*  --  8.0*  --  8.0*  7.8*  --   --   --   MG 2.1  --   --   --  2.1  --   --   --   PHOS 3.9  --  2.9  --  2.2*  2.2*  --   --   --    < > = values in this interval not displayed.   Liver Function Tests Recent Labs    06/01/23 0424 06/01/23 1617 06/02/23 0403  AST 88*  --  112*  ALT 12  --  10  ALKPHOS 80  --  84  BILITOT 4.4*  --  4.5*  PROT 5.7*  --  6.0*  ALBUMIN 2.0*  2.0* 2.0* 1.8*  1.8*   No results for input(s): "LIPASE",  "AMYLASE" in the last 72 hours. Cardiac Enzymes No results for input(s): "CKTOTAL", "CKMB", "CKMBINDEX", "TROPONINI" in the last 72 hours.  BNP: BNP (last 3 results) Recent Labs    05/14/2023 1100  BNP 2,097.0*    ProBNP (last 3 results) No results for input(s): "PROBNP" in the last 8760 hours.   D-Dimer No results for input(s): "DDIMER" in the last 72 hours. Hemoglobin A1C No results for input(s): "HGBA1C" in the last 72 hours.  Fasting Lipid Panel No results for input(s): "CHOL", "HDL", "LDLCALC", "TRIG", "CHOLHDL", "LDLDIRECT" in the last 72 hours.  Thyroid Function Tests No results for input(s): "TSH", "T4TOTAL", "T3FREE", "THYROIDAB" in the last 72 hours.  Invalid input(s): "FREET3"  Other results:   Imaging    DG CHEST PORT 1 VIEW  Result Date: 06/02/2023 CLINICAL DATA:  ECMO. EXAM: PORTABLE CHEST 1 VIEW COMPARISON:  June 01, 2023. FINDINGS: Stable endotracheal and nasogastric tube. Stable bilateral jugular catheters. Stable ECMO device. Stable bilateral chest tubes without pneumothorax. Stable bilateral lung opacities, right greater than left. IMPRESSION: Stable support apparatus.  Stable bilateral lung opacities. Electronically Signed   By: Lupita Raider M.D.   On: 06/02/2023 10:01     Medications:     Scheduled Medications:  sodium chloride   Intravenous Once   sodium chloride   Intravenous Once   sodium chloride   Intravenous Once   sodium chloride   Intravenous Once   sodium chloride   Intravenous Once   sodium chloride   Intravenous Once   sodium chloride   Intravenous Once   atorvastatin  80 mg Per Tube q1800   bisacodyl  10 mg Oral Daily   Or   bisacodyl  10 mg Rectal Daily   Chlorhexidine Gluconate Cloth  6 each Topical Daily   docusate  100 mg Per Tube BID   ezetimibe  10 mg Per  Tube Daily   insulin aspart  0-20 Units Subcutaneous Q4H   insulin glargine-yfgn  15 Units Subcutaneous Daily   metoCLOPramide (REGLAN) injection  5 mg  Intravenous Q6H   mouth rinse  15 mL Mouth Rinse Q2H   pantoprazole (PROTONIX) IV  40 mg Intravenous QHS   sodium chloride flush  3 mL Intravenous Q12H    Infusions:   prismasol BGK 4/2.5 400 mL/hr at 06/02/23 0554    prismasol BGK 4/2.5 400 mL/hr at 06/02/23 0943   sodium chloride Stopped (05/24/23 0032)   sodium chloride Stopped (06/01/23 2021)   sodium chloride 10 mL/hr at 06/02/23 1400   albumin human Stopped (05/30/23 0819)   anticoagulant sodium citrate     bivalirudin (ANGIOMAX) 250 mg in sodium chloride 0.9 % 500 mL (0.5 mg/mL) infusion 0.003 mg/kg/hr (06/02/23 1400)   dexmedetomidine (PRECEDEX) IV infusion 0.5 mcg/kg/hr (06/02/23 1459)   epinephrine 9 mcg/min (06/02/23 1400)   HYDROmorphone Stopped (06/02/23 0247)   insulin Stopped (05/21/23 0754)   lactated ringers Stopped (05/27/23 2100)   meropenem (MERREM) IV 1 g (06/02/23 1404)   micafungin (MYCAMINE) 200 mg in sodium chloride 0.9 % 100 mL IVPB Stopped (06/02/23 0907)   midazolam Stopped (06/02/23 0247)   norepinephrine (LEVOPHED) Adult infusion Stopped (06/01/23 1405)   prismasol BGK 4/2.5 1,500 mL/hr at 06/02/23 1103   sodium phosphate 30 mmol in sodium chloride 0.9 % 250 mL infusion 43 mL/hr at 06/02/23 1400   TPN ADULT (ION) 75 mL/hr at 06/02/23 1400   TPN ADULT (ION)     vancomycin Stopped (06/01/23 2309)   vasopressin 0.04 Units/min (06/02/23 1459)    PRN Medications: sodium chloride, sodium chloride, albumin human, anticoagulant sodium citrate, artificial tears, dextrose, HYDROmorphone, midazolam, midazolam, morphine injection, ondansetron (ZOFRAN) IV, mouth rinse, oxidized cellulose, oxyCODONE, sodium chloride flush, traMADol    Assessment/Plan   1. Post-cardiotomy vasoplegia/shock with failure to wean from CPB: Now on central VA ECMO (05/27/2023) with RA drainage/ascending aorta return. Post-op echo with EF 55%, mild-moderate RV dysfunction. Unable to get images yesterday by TTE.  TEE on 9/16 showed EF 30%,  moderate LVH, moderate RV dysfunction with mild enlargement, stable bioprosthetic aortic valve, repaired TV with mild TR, no endocarditis noted.  To OR for mediastinal washout on 9/19.  By TEE 9/19, EF 50% with moderate LVH, mild RV enlargement and mild RV dysfunction, stable bioprosthetic aortic valve, repaired TV with mild TR.  He did not tolerate lowering of ECMO flows even with increasing of pressors due to vasoplegia. CXR with clearing left lung, right lung with persistent airspace disease not clearing with CVVH. Switch from [place] ECMO to VV ECMO with chest closure on 05/30/2023 - Remains on epi 8 and VP 0.04 Off NE. Will continue to wean as tolerated - Continue to wean sedation - Weight up 10 pounds. Continue to pull on CVVHD -50-75 - Continue low-dose bival 2. Acute hypoxic respiratory failure, post-op: On vent, rest settings. Blender added to keep PaO2 down. Sweep remains 4.5. CXR with some clearing of left lung, right lung with ongoing diffuse airspace disease without change.  Suspect extensive PNA, think we have done what we can with CVVH for lung clearing at this point. Bronchoscopy yesterday with no change in CXR.  -  Now on VV ECMO  - R lung consolidation is severe. Await bronch cultures  - Meropenem/vancomycin/micafungin for coverage of PNA - Will need trach.  - D/w CCM 3. Severe prosthetic AI: Due to previous MSSA endocarditis with partial valve  dehiscence s/p re-do AVR/Bentall with reimplantation of SVG-RCA and TV repair Jun 15, 2023.   - Continuing meropenem/vancomycin/micafungin  4. CAD s/p CABG/AVR 2017:  s/p AVR/Bentall and re-implantation of SVG-RCA June 15, 2023. - Off ASA with platelets down.  5. ESRD: CVVH as above. Keep negative 6. DM2: management per CCM 7. Heart block: Post-op, now in stable junctional rhythm.   8. H/o CVA: Prior cerebellar CVA.  9. Thrombocytopenia: Post-op, 43 => 38 => 28 => 20 => 24 K => 19 => 20K -> 23K -> 26K today.  Suspect inflammatory and hemolysis.  10. Acute  blood loss anemia: Post-op, transfuse hgb < 8.  11. ID: H/o MSSA bacteremia.  WBCs 54 => 53 => 45 => 48.7 => 48K => 42 => 53K => 45K => 41K -> 40k now.  - Meropenem + vancomycin.  - Micafungin for fungal coverage added 9/20 12. FEN: TFs restarted slowly via [place].    CRITICAL CARE Performed by: Arvilla Meres  Total critical care time: 50 minutes  Critical care time was exclusive of separately billable procedures and treating other patients.  Critical care was necessary to treat or prevent imminent or life-threatening deterioration.  Critical care was time spent personally by me on the following activities: development of treatment plan with patient and/or surrogate as well as nursing, discussions with consultants, evaluation of patient's response to treatment, examination of patient, obtaining history from patient or surrogate, ordering and performing treatments and interventions, ordering and review of laboratory studies, ordering and review of radiographic studies, pulse oximetry and re-evaluation of patient's condition.    Length of Stay: 15  Arvilla Meres, MD  06/02/2023, 3:13 PM  Advanced Heart Failure Team Pager [phone] (M-F; 7a - 5p)  Please contact [facility] for night-coverage after hours (5p -7a ) and weekends on [email or web address]

## 2023-06-02 NOTE — Progress Notes (Signed)
ANTICOAGULATION CONSULT NOTE - Follow up Consult  Pharmacy Consult for bivalirudin Indication:  VV ECMO  Allergies  Allergen Reactions   Other Anaphylaxis    Mushrooms  Not listed on MAR    Plavix [Clopidogrel Bisulfate] Other (See Comments)    TTP Not listed on the [facility]   Fleet Enema [Enema] Other (See Comments)    Unknown reaction   Lovenox [Enoxaparin] Other (See Comments)    Unknown reaction   Morphine Other (See Comments)    Unknown reaction   Nsaids Other (See Comments)    Unknown reaction   Hydrocodone-Acetaminophen Itching    Patient Measurements: Height: 5\' 6"  (167.6 cm) Weight: 86.3 kg (190 lb 4.1 oz) IBW/kg (Calculated) : 63.8 Heparin Dosing Weight: 81.3 kg  Vital Signs: Temp: 97.7 F (36.5 C) (09/26 0800) Temp Source: Core (09/26 0800) Pulse Rate: 72 (09/26 0800)  Labs: Recent Labs    05/31/23 0418 05/31/23 0747 06/01/23 0424 06/01/23 0433 06/01/23 1357 06/01/23 1404 06/01/23 1617 06/01/23 1627 06/02/23 0403 06/02/23 0433 06/02/23 0801  HGB 8.3*   < > 9.4*   < >  --    < > 9.3*   < > 8.7* 8.8* 8.8*  HCT 25.3*   < > 27.7*   < >  --    < > 26.5*   < > 25.5* 26.0* 26.0*  PLT 23*   < > 66*  --   --   --  38*  --  26*  --   --   APTT 67*  --   --   --  51*  --   --   --  53*  --   --   LABPROT 17.7*  --  18.9*  --   --   --   --   --  18.0*  --   --   INR 1.4*  --  1.6*  --   --   --   --   --  1.5*  --   --   CREATININE 1.06   < > 1.19  --   --   --  1.11  --  0.98  1.03  --   --    < > = values in this interval not displayed.    Estimated Creatinine Clearance: 75.6 mL/min (by C-G formula based on SCr of 1.03 mg/dL).   Assessment: 62 yom underwent [place]/AVR, TV repair, re-implantation of SVG-RCA complicated with vasoplegia and pulmonary edema requiring central VA ECMO. On 9/24 patient underwent transition VA to VV ECMO. Bivalirudin continuous infusion was held prior to procedure. Patient received a total of 5 units of PRBC, 5 units of  platelets, and 2 units of FFP. Hgb stable at 9.5 and Plt up to 66. Of note, CRRT was stopped for procedure (~6 hours) but resumed upon arrival back to the floor at 1800. No AC PTA.  Resumed bivalirudin the morning after arriving back from the OR following ECMO VA > VV. Hgb stable at 9.5. Plt this AM 66 after replacement. Slight oozing from R internal jugular site but stable. Single small clot in circuit. No fibrin streaks. LDH 1076 and fibrinogen 482, both elevated but stable  aPTT is therapeutic at 53 sec on bivalirudin 0.003 mg/kg/hour. Single small clot in circuit. No fibrin streaks. LDH up to 1333 and fibrinogen 463, both elevated but stable   Goal of Therapy:  aPTT 50-70 seconds Monitor platelets by anticoagulation protocol: Yes   Plan:  Continue bivalirudin at 0.003 mg/kg/hr  Monitor q12 hr aPTT and CBC, LDH, fibrinogen and for s/sx of bleeding.  Wilmer Floor, PharmD PGY2 Cardiology Pharmacy Resident  06/02/2023 8:18 AM

## 2023-06-02 NOTE — Procedures (Signed)
Central Venous Catheter Insertion Procedure Note  Toronto Trabert  [id number]  1960/09/18  Date:06/02/23  Time:1:42 PM   Provider Performing:Carr Shartzer Salena Saner Katrinka Blazing   Procedure: Insertion of Non-tunneled Central Venous [id number]) with US guidance (56213)   Indication(s) Medication administration  Consent Part of ECMO consent  Anesthesia Topical only with 1% lidocaine   Timeout Verified patient identification, verified procedure, site/side was marked, verified correct patient position, special equipment/implants available, medications/allergies/relevant history reviewed, required imaging and test results available.  Sterile Technique Maximal sterile technique including full sterile barrier drape, hand hygiene, sterile gown, sterile gloves, mask, hair covering, sterile ultrasound probe cover (if used).  Procedure Description Area of catheter insertion was cleaned with chlorhexidine and draped in sterile fashion.  With real-time ultrasound guidance a central venous catheter was placed into the left subclavian vein. Nonpulsatile blood flow and easy flushing noted in all ports.  The catheter was sutured in place and sterile dressing applied.  Complications/Tolerance None; patient tolerated the procedure well. Chest X-ray is ordered to verify placement for internal jugular or subclavian cannulation.   Chest x-ray is not ordered for femoral cannulation.  EBL Minimal  Specimen(s) None

## 2023-06-02 NOTE — Progress Notes (Signed)
88.2  --  85.8  --  86.1  --   --   PLT 17*   < > 56*  --  72*  --  66*  --  38*  --  26*  --   --    < > = values in this interval not displayed.   Cardiac Enzymes: No results for input(s): "CKTOTAL", "CKMB", "CKMBINDEX", "TROPONINI" in the last 168 hours. CBG: Recent Labs  Lab 06/01/23 1625 06/01/23 1947 06/02/23 0000 06/02/23 0358 06/02/23 0759  GLUCAP 207* 207* 200* 204* 193*    Iron Studies: No results for input(s): "IRON", "TIBC", "TRANSFERRIN", "FERRITIN" in the last 72 hours. Studies/Results: DG CHEST PORT 1 VIEW  Result Date: 06/01/2023 CLINICAL DATA:  Respiratory failure EXAM: PORTABLE CHEST 1 VIEW COMPARISON:  05/31/2023 FINDINGS: Endotracheal tube, Swan-Ganz catheter, right dialysis catheter, ECMO, bilateral chest tubes, NG tube remain in place, unchanged. Prior CABG. Heart normal size. Bilateral airspace disease, most confluent in the right lung unchanged. No effusion or pneumothorax. IMPRESSION: Support devices stable. Stable bilateral airspace disease, right greater than left. Electronically Signed   By: Charlett Nose M.D.   On: 06/01/2023 10:47   DG CHEST PORT 1 VIEW  Result Date: 05/31/2023 CLINICAL DATA:  Chest pain, history of ECMO. EXAM: PORTABLE CHEST 1 VIEW COMPARISON:  Same day. FINDINGS: The heart size and mediastinal contours are within normal limits. Right internal jugular dialysis catheter is again noted. Endotracheal tube is in good position. Left internal jugular catheter is unchanged. Bilateral chest tubes are noted without pneumothorax. Status post coronary bypass graft. ECMO device is noted. Bilateral lung opacities are again noted, right greater than left. The visualized skeletal structures are unremarkable. IMPRESSION: Stable support apparatus and bilateral lung opacities as noted above. Electronically Signed   By: Lupita Raider M.D.   On: 05/31/2023 19:51   DG Abd 1 View  Result Date: 05/31/2023 CLINICAL DATA:  Orogastric  tube placement. EXAM: ABDOMEN - 1 VIEW COMPARISON:  May 29, 2023. FINDINGS: Distal tip of nasogastric tube is seen in expected position of proximal stomach. IMPRESSION: Distal tip of nasogastric tube is seen in expected position of proximal stomach. Electronically Signed   By: Lupita Raider M.D.   On: 05/31/2023 19:49   ECHO INTRAOPERATIVE TEE  Result Date: 05/31/2023  *INTRAOPERATIVE TRANSESOPHAGEAL REPORT *  Patient Name:   [name]  Date of Exam: 05/31/2023 Medical Rec #:  [id number]      Height:       66.0 in Accession #:    [id number]     Weight:       186.7 lb Date of Birth:  May 04, 1961       BSA:          1.94 m Patient Age:    62 years       BP:           138/80 mmHg Patient Gender: M              HR:           68 bpm. Exam Location:  Anesthesiology Transesophogeal exam was perform intraoperatively during surgical procedure. Patient was closely monitored under general anesthesia during the entirety of examination. Indications:     VA ECMO conversion to VV ECMO Performing Phys: [address] Borne Diagnosing Phys: Jairo Ben MD Complications: No known complications during this procedure. POST-OP IMPRESSIONS limited post exchange to VV ECMO _ Left Ventricle: The left ventricular function is essentially unchanged from  Patient ID: Raad Bajema, male   DOB: Aug 23, 1961, 62 y.o.   MRN: [id number] S: Pt seen and examined while on CRRT.  Labs and orders reviewed and adjustments made accordingly.  Discussed case with RN and PCCM. O:BP 94/66   Pulse 72   Temp 97.7 F (36.5 C) (Core)   Resp 19   Ht 5\' 6"  (1.676 m)   Wt 86.3 kg   SpO2 100%   BMI 30.71 kg/m   Intake/Output Summary (Last 24 hours) at 06/02/2023 0907 Last data filed at 06/02/2023 0800 Gross per 24 hour  Intake 3617.47 ml  Output 5123.3 ml  Net -1505.83 ml   Intake/Output: I/O last 3 completed shifts: In: 8079.1 [I.V.:5277.1; Blood:1692.2; NG/GT:100; IV Piggyback:1009.8] Out: 7322.1 [Emesis/NG output:250; Stool:105; Blood:425; Chest Tube:589]  Intake/Output this shift:  Total I/O In: 123.7 [I.V.:123.7] Out: 244  Weight change: -1 kg BJY:NWGNFAOZHY ill-appearing, intubated and sedated CVS:RRR Resp:coarse BS bilaterally Abd:+BS, soft Ext: trace presacral edema  Recent Labs  Lab 05/27/23 0307 05/27/23 0315 05/28/23 0411 05/28/23 0416 05/29/23 0407 05/29/23 0751 05/30/23 0418 05/30/23 8657 05/30/23 1608 05/30/23 1945 05/31/23 0418 05/31/23 0747 05/31/23 1713 05/31/23 1800 06/01/23 0424 06/01/23 0433 06/01/23 1404 06/01/23 1617 06/01/23 1627 06/01/23 2213 06/02/23 0403 06/02/23 0433 06/02/23 0801  NA 136   < > 136   < > 134*   < > 134*   < > 135   < > 135   < > 137   < > 137   < > 137 137 137 137 136  134* 135 136  K 4.3   < > 4.1   < > 3.7   < > 3.7   < > 3.9   < > 3.8   < > 3.8   < > 4.2   < > 3.8 4.4 4.0 3.8 4.1  4.1 4.0 4.1  CL 99   < > 103   < > 99   < > 102  --  102  --  101  --  106  --  102  --   --  97*  --   --  96*  96*  --   --   CO2 23   < > 23   < > 21*   < > 22  --  24  --  23  --  21*  --  19*  --   --  25  --   --  27  32  --   --   GLUCOSE 107*   < > 142*   < > 163*   < > 118*  --  141*  --  172*  --  117*  --  204*  --   --  206*  --   --  202*  202*  --   --   BUN 17   < > 16   < > 29*   < > 22   --  16  --  14  --  17  --  18  --   --  20  --   --  23  23  --   --   CREATININE 1.20   < > 1.13   < > 1.11   < > 1.13  --  1.20  --  1.06  --  1.26*  --  1.19  --   --  1.11  --   --  0.98  1.03  --   --  88.2  --  85.8  --  86.1  --   --   PLT 17*   < > 56*  --  72*  --  66*  --  38*  --  26*  --   --    < > = values in this interval not displayed.   Cardiac Enzymes: No results for input(s): "CKTOTAL", "CKMB", "CKMBINDEX", "TROPONINI" in the last 168 hours. CBG: Recent Labs  Lab 06/01/23 1625 06/01/23 1947 06/02/23 0000 06/02/23 0358 06/02/23 0759  GLUCAP 207* 207* 200* 204* 193*    Iron Studies: No results for input(s): "IRON", "TIBC", "TRANSFERRIN", "FERRITIN" in the last 72 hours. Studies/Results: DG CHEST PORT 1 VIEW  Result Date: 06/01/2023 CLINICAL DATA:  Respiratory failure EXAM: PORTABLE CHEST 1 VIEW COMPARISON:  05/31/2023 FINDINGS: Endotracheal tube, Swan-Ganz catheter, right dialysis catheter, ECMO, bilateral chest tubes, NG tube remain in place, unchanged. Prior CABG. Heart normal size. Bilateral airspace disease, most confluent in the right lung unchanged. No effusion or pneumothorax. IMPRESSION: Support devices stable. Stable bilateral airspace disease, right greater than left. Electronically Signed   By: Charlett Nose M.D.   On: 06/01/2023 10:47   DG CHEST PORT 1 VIEW  Result Date: 05/31/2023 CLINICAL DATA:  Chest pain, history of ECMO. EXAM: PORTABLE CHEST 1 VIEW COMPARISON:  Same day. FINDINGS: The heart size and mediastinal contours are within normal limits. Right internal jugular dialysis catheter is again noted. Endotracheal tube is in good position. Left internal jugular catheter is unchanged. Bilateral chest tubes are noted without pneumothorax. Status post coronary bypass graft. ECMO device is noted. Bilateral lung opacities are again noted, right greater than left. The visualized skeletal structures are unremarkable. IMPRESSION: Stable support apparatus and bilateral lung opacities as noted above. Electronically Signed   By: Lupita Raider M.D.   On: 05/31/2023 19:51   DG Abd 1 View  Result Date: 05/31/2023 CLINICAL DATA:  Orogastric  tube placement. EXAM: ABDOMEN - 1 VIEW COMPARISON:  May 29, 2023. FINDINGS: Distal tip of nasogastric tube is seen in expected position of proximal stomach. IMPRESSION: Distal tip of nasogastric tube is seen in expected position of proximal stomach. Electronically Signed   By: Lupita Raider M.D.   On: 05/31/2023 19:49   ECHO INTRAOPERATIVE TEE  Result Date: 05/31/2023  *INTRAOPERATIVE TRANSESOPHAGEAL REPORT *  Patient Name:   [name]  Date of Exam: 05/31/2023 Medical Rec #:  [id number]      Height:       66.0 in Accession #:    [id number]     Weight:       186.7 lb Date of Birth:  May 04, 1961       BSA:          1.94 m Patient Age:    62 years       BP:           138/80 mmHg Patient Gender: M              HR:           68 bpm. Exam Location:  Anesthesiology Transesophogeal exam was perform intraoperatively during surgical procedure. Patient was closely monitored under general anesthesia during the entirety of examination. Indications:     VA ECMO conversion to VV ECMO Performing Phys: [address] Alleen Borne Diagnosing Phys: Jairo Ben MD Complications: No known complications during this procedure. POST-OP IMPRESSIONS limited post exchange to VV ECMO _ Left Ventricle: The left ventricular function is essentially unchanged from  88.2  --  85.8  --  86.1  --   --   PLT 17*   < > 56*  --  72*  --  66*  --  38*  --  26*  --   --    < > = values in this interval not displayed.   Cardiac Enzymes: No results for input(s): "CKTOTAL", "CKMB", "CKMBINDEX", "TROPONINI" in the last 168 hours. CBG: Recent Labs  Lab 06/01/23 1625 06/01/23 1947 06/02/23 0000 06/02/23 0358 06/02/23 0759  GLUCAP 207* 207* 200* 204* 193*    Iron Studies: No results for input(s): "IRON", "TIBC", "TRANSFERRIN", "FERRITIN" in the last 72 hours. Studies/Results: DG CHEST PORT 1 VIEW  Result Date: 06/01/2023 CLINICAL DATA:  Respiratory failure EXAM: PORTABLE CHEST 1 VIEW COMPARISON:  05/31/2023 FINDINGS: Endotracheal tube, Swan-Ganz catheter, right dialysis catheter, ECMO, bilateral chest tubes, NG tube remain in place, unchanged. Prior CABG. Heart normal size. Bilateral airspace disease, most confluent in the right lung unchanged. No effusion or pneumothorax. IMPRESSION: Support devices stable. Stable bilateral airspace disease, right greater than left. Electronically Signed   By: Charlett Nose M.D.   On: 06/01/2023 10:47   DG CHEST PORT 1 VIEW  Result Date: 05/31/2023 CLINICAL DATA:  Chest pain, history of ECMO. EXAM: PORTABLE CHEST 1 VIEW COMPARISON:  Same day. FINDINGS: The heart size and mediastinal contours are within normal limits. Right internal jugular dialysis catheter is again noted. Endotracheal tube is in good position. Left internal jugular catheter is unchanged. Bilateral chest tubes are noted without pneumothorax. Status post coronary bypass graft. ECMO device is noted. Bilateral lung opacities are again noted, right greater than left. The visualized skeletal structures are unremarkable. IMPRESSION: Stable support apparatus and bilateral lung opacities as noted above. Electronically Signed   By: Lupita Raider M.D.   On: 05/31/2023 19:51   DG Abd 1 View  Result Date: 05/31/2023 CLINICAL DATA:  Orogastric  tube placement. EXAM: ABDOMEN - 1 VIEW COMPARISON:  May 29, 2023. FINDINGS: Distal tip of nasogastric tube is seen in expected position of proximal stomach. IMPRESSION: Distal tip of nasogastric tube is seen in expected position of proximal stomach. Electronically Signed   By: Lupita Raider M.D.   On: 05/31/2023 19:49   ECHO INTRAOPERATIVE TEE  Result Date: 05/31/2023  *INTRAOPERATIVE TRANSESOPHAGEAL REPORT *  Patient Name:   [name]  Date of Exam: 05/31/2023 Medical Rec #:  [id number]      Height:       66.0 in Accession #:    [id number]     Weight:       186.7 lb Date of Birth:  May 04, 1961       BSA:          1.94 m Patient Age:    62 years       BP:           138/80 mmHg Patient Gender: M              HR:           68 bpm. Exam Location:  Anesthesiology Transesophogeal exam was perform intraoperatively during surgical procedure. Patient was closely monitored under general anesthesia during the entirety of examination. Indications:     VA ECMO conversion to VV ECMO Performing Phys: 24[address] Alleen Borne Diagnosing Phys: Jairo Ben MD Complications: No known complications during this procedure. POST-OP IMPRESSIONS limited post exchange to VV ECMO _ Left Ventricle: The left ventricular function is essentially unchanged from  88.2  --  85.8  --  86.1  --   --   PLT 17*   < > 56*  --  72*  --  66*  --  38*  --  26*  --   --    < > = values in this interval not displayed.   Cardiac Enzymes: No results for input(s): "CKTOTAL", "CKMB", "CKMBINDEX", "TROPONINI" in the last 168 hours. CBG: Recent Labs  Lab 06/01/23 1625 06/01/23 1947 06/02/23 0000 06/02/23 0358 06/02/23 0759  GLUCAP 207* 207* 200* 204* 193*    Iron Studies: No results for input(s): "IRON", "TIBC", "TRANSFERRIN", "FERRITIN" in the last 72 hours. Studies/Results: DG CHEST PORT 1 VIEW  Result Date: 06/01/2023 CLINICAL DATA:  Respiratory failure EXAM: PORTABLE CHEST 1 VIEW COMPARISON:  05/31/2023 FINDINGS: Endotracheal tube, Swan-Ganz catheter, right dialysis catheter, ECMO, bilateral chest tubes, NG tube remain in place, unchanged. Prior CABG. Heart normal size. Bilateral airspace disease, most confluent in the right lung unchanged. No effusion or pneumothorax. IMPRESSION: Support devices stable. Stable bilateral airspace disease, right greater than left. Electronically Signed   By: Charlett Nose M.D.   On: 06/01/2023 10:47   DG CHEST PORT 1 VIEW  Result Date: 05/31/2023 CLINICAL DATA:  Chest pain, history of ECMO. EXAM: PORTABLE CHEST 1 VIEW COMPARISON:  Same day. FINDINGS: The heart size and mediastinal contours are within normal limits. Right internal jugular dialysis catheter is again noted. Endotracheal tube is in good position. Left internal jugular catheter is unchanged. Bilateral chest tubes are noted without pneumothorax. Status post coronary bypass graft. ECMO device is noted. Bilateral lung opacities are again noted, right greater than left. The visualized skeletal structures are unremarkable. IMPRESSION: Stable support apparatus and bilateral lung opacities as noted above. Electronically Signed   By: Lupita Raider M.D.   On: 05/31/2023 19:51   DG Abd 1 View  Result Date: 05/31/2023 CLINICAL DATA:  Orogastric  tube placement. EXAM: ABDOMEN - 1 VIEW COMPARISON:  May 29, 2023. FINDINGS: Distal tip of nasogastric tube is seen in expected position of proximal stomach. IMPRESSION: Distal tip of nasogastric tube is seen in expected position of proximal stomach. Electronically Signed   By: Lupita Raider M.D.   On: 05/31/2023 19:49   ECHO INTRAOPERATIVE TEE  Result Date: 05/31/2023  *INTRAOPERATIVE TRANSESOPHAGEAL REPORT *  Patient Name:   [name]  Date of Exam: 05/31/2023 Medical Rec #:  [id number]      Height:       66.0 in Accession #:    [id number]     Weight:       186.7 lb Date of Birth:  May 04, 1961       BSA:          1.94 m Patient Age:    62 years       BP:           138/80 mmHg Patient Gender: M              HR:           68 bpm. Exam Location:  Anesthesiology Transesophogeal exam was perform intraoperatively during surgical procedure. Patient was closely monitored under general anesthesia during the entirety of examination. Indications:     VA ECMO conversion to VV ECMO Performing Phys: 24[address] Alleen Borne Diagnosing Phys: Jairo Ben MD Complications: No known complications during this procedure. POST-OP IMPRESSIONS limited post exchange to VV ECMO _ Left Ventricle: The left ventricular function is essentially unchanged from  Patient ID: Raad Bajema, male   DOB: Aug 23, 1961, 62 y.o.   MRN: [id number] S: Pt seen and examined while on CRRT.  Labs and orders reviewed and adjustments made accordingly.  Discussed case with RN and PCCM. O:BP 94/66   Pulse 72   Temp 97.7 F (36.5 C) (Core)   Resp 19   Ht 5\' 6"  (1.676 m)   Wt 86.3 kg   SpO2 100%   BMI 30.71 kg/m   Intake/Output Summary (Last 24 hours) at 06/02/2023 0907 Last data filed at 06/02/2023 0800 Gross per 24 hour  Intake 3617.47 ml  Output 5123.3 ml  Net -1505.83 ml   Intake/Output: I/O last 3 completed shifts: In: 8079.1 [I.V.:5277.1; Blood:1692.2; NG/GT:100; IV Piggyback:1009.8] Out: 7322.1 [Emesis/NG output:250; Stool:105; Blood:425; Chest Tube:589]  Intake/Output this shift:  Total I/O In: 123.7 [I.V.:123.7] Out: 244  Weight change: -1 kg BJY:NWGNFAOZHY ill-appearing, intubated and sedated CVS:RRR Resp:coarse BS bilaterally Abd:+BS, soft Ext: trace presacral edema  Recent Labs  Lab 05/27/23 0307 05/27/23 0315 05/28/23 0411 05/28/23 0416 05/29/23 0407 05/29/23 0751 05/30/23 0418 05/30/23 8657 05/30/23 1608 05/30/23 1945 05/31/23 0418 05/31/23 0747 05/31/23 1713 05/31/23 1800 06/01/23 0424 06/01/23 0433 06/01/23 1404 06/01/23 1617 06/01/23 1627 06/01/23 2213 06/02/23 0403 06/02/23 0433 06/02/23 0801  NA 136   < > 136   < > 134*   < > 134*   < > 135   < > 135   < > 137   < > 137   < > 137 137 137 137 136  134* 135 136  K 4.3   < > 4.1   < > 3.7   < > 3.7   < > 3.9   < > 3.8   < > 3.8   < > 4.2   < > 3.8 4.4 4.0 3.8 4.1  4.1 4.0 4.1  CL 99   < > 103   < > 99   < > 102  --  102  --  101  --  106  --  102  --   --  97*  --   --  96*  96*  --   --   CO2 23   < > 23   < > 21*   < > 22  --  24  --  23  --  21*  --  19*  --   --  25  --   --  27  32  --   --   GLUCOSE 107*   < > 142*   < > 163*   < > 118*  --  141*  --  172*  --  117*  --  204*  --   --  206*  --   --  202*  202*  --   --   BUN 17   < > 16   < > 29*   < > 22   --  16  --  14  --  17  --  18  --   --  20  --   --  23  23  --   --   CREATININE 1.20   < > 1.13   < > 1.11   < > 1.13  --  1.20  --  1.06  --  1.26*  --  1.19  --   --  1.11  --   --  0.98  1.03  --   --

## 2023-06-02 NOTE — Progress Notes (Signed)
PHARMACY - TOTAL PARENTERAL NUTRITION CONSULT NOTE   Indication:  unable to tolerate tube feeding  Patient Measurements: Height: 5\' 6"  (167.6 cm) Weight: 86.3 kg (190 lb 4.1 oz) IBW/kg (Calculated) : 63.8 TPN AdjBW (KG): 69 Body mass index is 30.71 kg/m. Usual Weight: ~90kg  Assessment: 62 yo male with significant PMH for HLD, HTN, T2DM, CKD4 (prev on HD) and recent MSSA bacteremia + endocarditis (May 2024). Back in May 2024 admission, underwent ERCP for cholangitis/pancreatitis. Admitted on 9/11 w/ chest pain found to have aortic valve dehiscence. S/p aortic valve replacement redo on 9/13 - developed pulmonary edema post op and was placed on [facility]O. S/p mediastinal washout on 9/19 - chest still open. Unable to speak w/ patient due to intubation (utilizing Precedex and Versed drips for sedation). Pharmacy consulted to manage TPN for inability to tolerate tube feeding and malnutrition.   Glucose / Insulin: hx T2DM - A1c 6.1%, on Farxiga PTA. Previously on insulin gtt post CT surgery >> transitioned to mSSI q4h. ~23 units SSI used/24 hours. CBGs low 200s. Started on Semglee yesterday.  Electrolytes: K 4 (stable), Na 135, Phos 2.2 (low), Mg 2.1 (none in TPN), CoCa 9.8 (none in TPN), CO2 27, Cl 96 Renal: Hx CKD4 currently on CRRT (required HD in the past), using 4K bags, systemic bivalirudin for anticoag - not using systemic sodium citrate infusion, only for catheter lock per d/w RN at bedside on 9/24 CRRT 9/11 >>  Hepatic: Alk phos 84, AST 88/ALT 12 - both rising slightly, Tbili 4.4, albumin 2, TG 122 Intake / Output: OG output, Trickle TF @ 20 mL/hr stopped 9/22 PM due to inc OG output and no bowel sounds. Relistor x1 given 9/23, receiving Reglan 5mg  IV q6h - Chest tube output , stool output 70 mL - NGT placed 9/22, Cortrak placed 9/20  MIVF: LR and NS @ KVO GI Imaging:  - None since TPN start  GI Surgeries / Procedures:  - None since TPN start - planning transition to VV ECMO on  9/24  Central access: 9/13 TPN start date: 9/23  Nutritional Goals: Goal concentrated TPN rate is 75 mL/hr (provides 131 g of protein and 2250 kcals per day)  RD Assessment: Estimated Needs Total Energy Estimated Needs: 2200-2400 Total Protein Estimated Needs: 130-150 grams Total Fluid Estimated Needs: 1L plus UOP  Current Nutrition:  NPO and TPN  Plan:  Continue concentrated TPN at 75 mL/hr at 1800 - will provide 2250 kcal and 131 g protein meeting ~100% of needs Electrolytes in TPN: Na 104mEq/L, K 66mEq/L, Ca 52mEq/L, Mg 49mEq/L, and Phos to 20 mmol/L. Max chloride (still getting some bicarb) (TPN compounder unable to formulate other Cl:Ac ratios with current formula) Standard MVI and trace elements within TPN No chromium (on CRRT and currently on shortage) Add thiamine to TPN x 5 days for refeeding risk (9/23 >> 9/27) Adjust SSI to Resistant q4h SSI  Increase Semglee to 15 units SQ daily per discussion with CCM - can consider adding to TPN bag once needs determined.  Continue M[facility]F at [facility] Monitor TPN labs on Mon/Thurs, RFP BID while on CRRT  Repeat sodium phosphate IV x1 today (switch base fluid to NS given elev CBGs)  Rexford Maus, PharmD, BCPS 06/02/2023 7:23 AM

## 2023-06-02 NOTE — Progress Notes (Signed)
Patient ID: [name], male   DOB: October 08, 1960, 62 y.o.   MRN: [id number]  TCTS Evening Rounds:  Hemodynamics fairly stable today.  Epi 9, vaso 0.04  -522 cc so far today.  CBC    Component Value Date/Time   WBC 45.0 (H) 06/02/2023 1553   RBC 2.91 (L) 06/02/2023 1553   HGB 9.5 (L) 06/02/2023 1601   HGB 6.4 (LL) 05/17/2023 0852   HGB 10.2 (L) 08/12/2022 0934   HCT 28.0 (L) 06/02/2023 1601   HCT 29.2 (L) 08/12/2022 0934   PLT 21 (LL) 06/02/2023 1553   PLT 109 (L) 05/17/2023 0852   PLT 252 08/12/2022 0934   MCV 88.7 06/02/2023 1553   MCV 94 08/12/2022 0934   MCH 29.9 06/02/2023 1553   MCHC 33.7 06/02/2023 1553   RDW 17.6 (H) 06/02/2023 1553   RDW 15.5 (H) 08/12/2022 0934   LYMPHSABS 5.8 (H) 05/29/2023 1722   LYMPHSABS 1.9 11/04/2021 1028   MONOABS 6.3 (H) 05/29/2023 1722   EOSABS 0.0 05/29/2023 1722   EOSABS 0.2 11/04/2021 1028   BASOSABS 0.0 05/29/2023 1722   BASOSABS 0.1 11/04/2021 1028   BMET    Component Value Date/Time   NA 135 06/02/2023 1601   NA 143 11/02/2022 1454   K 4.3 06/02/2023 1601   CL 97 (L) 06/02/2023 1553   CO2 24 06/02/2023 1553   GLUCOSE 226 (H) 06/02/2023 1553   BUN 23 06/02/2023 1553   BUN 31 (H) 11/02/2022 1454   CREATININE 0.92 06/02/2023 1553   CREATININE 2.71 (H) 05/17/2023 0950   CREATININE 1.05 02/24/2015 1601   CALCIUM 7.7 (L) 06/02/2023 1553   EGFR 43 (L) 11/02/2022 1454   GFRNONAA >60 06/02/2023 1553   GFRNONAA 26 (L) 05/17/2023 0950   GFRNONAA 80 02/24/2015 1601   Receiving some plts to remove neck sleeve.

## 2023-06-02 NOTE — Progress Notes (Signed)
Patient ID: [name], male   DOB: 1960-12-19, 62 y.o.   MRN: [id number]   Extracorporeal support note   ECLS support day: 3 Indication: Acute hypoxic respiratory failure  Configuration: Central VV ECMO  30FR Dual-lumen Crescent cannula in RIJ   Pump speed: 3400 rpm  Pump flow: 3.5 L/min Pump used: Cardiohelp  Sweep gas: 7  Circuit check: Small clot at 12p Anticoagulant: None for now due to post-op bleeding Anticoagulation targets:   Changes in support: none  Anticipated goals/duration of support: Wean off VV ECMO as tolerated. Discussed on multi-disciplinary rounds  .Arvilla Meres MD  06/02/2023, 3:12 PM

## 2023-06-03 ENCOUNTER — Inpatient Hospital Stay (HOSPITAL_COMMUNITY): Payer: PPO

## 2023-06-03 DIAGNOSIS — T8111XA Postprocedural  cardiogenic shock, initial encounter: Secondary | ICD-10-CM | POA: Diagnosis not present

## 2023-06-03 DIAGNOSIS — J9601 Acute respiratory failure with hypoxia: Secondary | ICD-10-CM | POA: Diagnosis not present

## 2023-06-03 DIAGNOSIS — R570 Cardiogenic shock: Secondary | ICD-10-CM | POA: Diagnosis not present

## 2023-06-03 LAB — CBC
HCT: 24.9 % — ABNORMAL LOW (ref 39.0–52.0)
HCT: 25.2 % — ABNORMAL LOW (ref 39.0–52.0)
Hemoglobin: 8.4 g/dL — ABNORMAL LOW (ref 13.0–17.0)
Hemoglobin: 8.3 g/dL — ABNORMAL LOW (ref 13.0–17.0)
MCH: 29.8 pg (ref 26.0–34.0)
MCH: 29.9 pg (ref 26.0–34.0)
MCHC: 33.7 g/dL (ref 30.0–36.0)
MCHC: 32.9 g/dL (ref 30.0–36.0)
MCV: 88.3 fL (ref 80.0–100.0)
MCV: 90.6 fL (ref 80.0–100.0)
Platelets: 44 10*3/uL — ABNORMAL LOW (ref 150–400)
Platelets: 31 10*3/uL — ABNORMAL LOW (ref 150–400)
RBC: 2.82 MIL/uL — ABNORMAL LOW (ref 4.22–5.81)
RBC: 2.78 MIL/uL — ABNORMAL LOW (ref 4.22–5.81)
RDW: 17.5 % — ABNORMAL HIGH (ref 11.5–15.5)
RDW: 17.5 % — ABNORMAL HIGH (ref 11.5–15.5)
WBC: 44.4 10*3/uL — ABNORMAL HIGH (ref 4.0–10.5)
WBC: 43.3 10*3/uL — ABNORMAL HIGH (ref 4.0–10.5)
nRBC: 1 % — ABNORMAL HIGH (ref 0.0–0.2)
nRBC: 0.9 % — ABNORMAL HIGH (ref 0.0–0.2)

## 2023-06-03 LAB — PROTIME-INR
INR: 1.4 — ABNORMAL HIGH (ref 0.8–1.2)
Prothrombin Time: 17.6 s — ABNORMAL HIGH (ref 11.4–15.2)

## 2023-06-03 LAB — POCT I-STAT 7, (LYTES, BLD GAS, ICA,H+H)
Acid-Base Excess: 4 mmol/L — ABNORMAL HIGH (ref 0.0–2.0)
Acid-Base Excess: 2 mmol/L (ref 0.0–2.0)
Acid-Base Excess: 2 mmol/L (ref 0.0–2.0)
Acid-Base Excess: 2 mmol/L (ref 0.0–2.0)
Bicarbonate: 28.3 mmol/L — ABNORMAL HIGH (ref 20.0–28.0)
Bicarbonate: 26.6 mmol/L (ref 20.0–28.0)
Bicarbonate: 26.3 mmol/L (ref 20.0–28.0)
Bicarbonate: 25.8 mmol/L (ref 20.0–28.0)
Calcium, Ion: 1.12 mmol/L — ABNORMAL LOW (ref 1.15–1.40)
Calcium, Ion: 1.12 mmol/L — ABNORMAL LOW (ref 1.15–1.40)
Calcium, Ion: 1.12 mmol/L — ABNORMAL LOW (ref 1.15–1.40)
Calcium, Ion: 1.12 mmol/L — ABNORMAL LOW (ref 1.15–1.40)
HCT: 27 % — ABNORMAL LOW (ref 39.0–52.0)
HCT: 25 % — ABNORMAL LOW (ref 39.0–52.0)
HCT: 25 % — ABNORMAL LOW (ref 39.0–52.0)
HCT: 26 % — ABNORMAL LOW (ref 39.0–52.0)
Hemoglobin: 9.2 g/dL — ABNORMAL LOW (ref 13.0–17.0)
Hemoglobin: 8.5 g/dL — ABNORMAL LOW (ref 13.0–17.0)
Hemoglobin: 8.5 g/dL — ABNORMAL LOW (ref 13.0–17.0)
Hemoglobin: 8.8 g/dL — ABNORMAL LOW (ref 13.0–17.0)
O2 Saturation: 99 %
O2 Saturation: 100 %
O2 Saturation: 100 %
O2 Saturation: 100 %
Patient temperature: 36.8
Patient temperature: 97.9
Patient temperature: 36.6
Patient temperature: 36.8
Potassium: 4.5 mmol/L (ref 3.5–5.1)
Potassium: 4.4 mmol/L (ref 3.5–5.1)
Potassium: 4.5 mmol/L (ref 3.5–5.1)
Potassium: 4.4 mmol/L (ref 3.5–5.1)
Sodium: 136 mmol/L (ref 135–145)
Sodium: 136 mmol/L (ref 135–145)
Sodium: 136 mmol/L (ref 135–145)
Sodium: 136 mmol/L (ref 135–145)
TCO2: 30 mmol/L (ref 22–32)
TCO2: 28 mmol/L (ref 22–32)
TCO2: 27 mmol/L (ref 22–32)
TCO2: 27 mmol/L (ref 22–32)
pCO2 arterial: 39.8 mm[Hg] (ref 32–48)
pCO2 arterial: 39.6 mm[Hg] (ref 32–48)
pCO2 arterial: 37.5 mm[Hg] (ref 32–48)
pCO2 arterial: 37.6 mm[Hg] (ref 32–48)
pH, Arterial: 7.46 — ABNORMAL HIGH (ref 7.35–7.45)
pH, Arterial: 7.434 (ref 7.35–7.45)
pH, Arterial: 7.454 — ABNORMAL HIGH (ref 7.35–7.45)
pH, Arterial: 7.443 (ref 7.35–7.45)
pO2, Arterial: 157 mm[Hg] — ABNORMAL HIGH (ref 83–108)
pO2, Arterial: 172 mm[Hg] — ABNORMAL HIGH (ref 83–108)
pO2, Arterial: 170 mm[Hg] — ABNORMAL HIGH (ref 83–108)
pO2, Arterial: 180 mm[Hg] — ABNORMAL HIGH (ref 83–108)

## 2023-06-03 LAB — CG4 I-STAT (LACTIC ACID)
Lactic Acid, Venous: 2 mmol/L (ref 0.5–1.9)
Lactic Acid, Venous: 1.7 mmol/L (ref 0.5–1.9)

## 2023-06-03 LAB — RENAL FUNCTION PANEL
Albumin: 1.9 g/dL — ABNORMAL LOW (ref 3.5–5.0)
Anion gap: 9 (ref 5–15)
BUN: 29 mg/dL — ABNORMAL HIGH (ref 8–23)
CO2: 25 mmol/L (ref 22–32)
Calcium: 7.9 mg/dL — ABNORMAL LOW (ref 8.9–10.3)
Chloride: 102 mmol/L (ref 98–111)
Creatinine, Ser: 1.05 mg/dL (ref 0.61–1.24)
GFR, Estimated: >60 mL/min (ref >60)
Glucose, Bld: 215 mg/dL — ABNORMAL HIGH (ref 70–99)
Phosphorus: 3.2 mg/dL (ref 2.5–4.6)
Potassium: 4.6 mmol/L (ref 3.5–5.1)
Sodium: 136 mmol/L (ref 135–145)

## 2023-06-03 LAB — BASIC METABOLIC PANEL
Anion gap: 9 (ref 5–15)
BUN: 24 mg/dL — ABNORMAL HIGH (ref 8–23)
CO2: 27 mmol/L (ref 22–32)
Calcium: 8.1 mg/dL — ABNORMAL LOW (ref 8.9–10.3)
Chloride: 97 mmol/L — ABNORMAL LOW (ref 98–111)
Creatinine, Ser: 0.93 mg/dL (ref 0.61–1.24)
GFR, Estimated: >60 mL/min (ref >60)
Glucose, Bld: 230 mg/dL — ABNORMAL HIGH (ref 70–99)
Potassium: 4.6 mmol/L (ref 3.5–5.1)
Sodium: 133 mmol/L — ABNORMAL LOW (ref 135–145)

## 2023-06-03 LAB — HEPATIC FUNCTION PANEL
ALT: 13 U/L (ref 0–44)
AST: 111 U/L — ABNORMAL HIGH (ref 15–41)
Albumin: 1.9 g/dL — ABNORMAL LOW (ref 3.5–5.0)
Alkaline Phosphatase: 90 U/L (ref 38–126)
Bilirubin, Direct: 2.6 mg/dL — ABNORMAL HIGH (ref 0.0–0.2)
Indirect Bilirubin: 2.1 mg/dL — ABNORMAL HIGH (ref 0.3–0.9)
Total Bilirubin: 4.7 mg/dL — ABNORMAL HIGH (ref 0.3–1.2)
Total Protein: 6.4 g/dL — ABNORMAL LOW (ref 6.5–8.1)

## 2023-06-03 LAB — CRYPTOCOCCAL ANTIGEN: Crypto Ag: NEGATIVE

## 2023-06-03 LAB — GLUCOSE, CAPILLARY
Glucose-Capillary: 126 mg/dL — ABNORMAL HIGH (ref 70–99)
Glucose-Capillary: 212 mg/dL — ABNORMAL HIGH (ref 70–99)
Glucose-Capillary: 216 mg/dL — ABNORMAL HIGH (ref 70–99)
Glucose-Capillary: 219 mg/dL — ABNORMAL HIGH (ref 70–99)
Glucose-Capillary: 228 mg/dL — ABNORMAL HIGH (ref 70–99)
Glucose-Capillary: 230 mg/dL — ABNORMAL HIGH (ref 70–99)

## 2023-06-03 LAB — COOXEMETRY PANEL
Carboxyhemoglobin: 3.4 % — ABNORMAL HIGH (ref 0.5–1.5)
Methemoglobin: 1.9 % — ABNORMAL HIGH (ref 0.0–1.5)
O2 Saturation: 61.9 %
Total hemoglobin: 8.5 g/dL — ABNORMAL LOW (ref 12.0–16.0)

## 2023-06-03 LAB — MAGNESIUM: Magnesium: 2.2 mg/dL (ref 1.7–2.4)

## 2023-06-03 LAB — APTT
aPTT: 50 s — ABNORMAL HIGH (ref 24–36)
aPTT: 52 s — ABNORMAL HIGH (ref 24–36)

## 2023-06-03 LAB — AMMONIA: Ammonia: 26 umol/L (ref 9–35)

## 2023-06-03 LAB — LACTATE DEHYDROGENASE: LDH: 1170 U/L — ABNORMAL HIGH (ref 98–192)

## 2023-06-03 LAB — PHOSPHORUS: Phosphorus: 3.2 mg/dL (ref 2.5–4.6)

## 2023-06-03 LAB — FIBRINOGEN: Fibrinogen: 483 mg/dL — ABNORMAL HIGH (ref 210–475)

## 2023-06-03 MED ORDER — INSULIN DETEMIR 100 UNIT/ML ~~LOC~~ SOLN
10.0000 [IU] | Freq: Two times a day (BID) | SUBCUTANEOUS | Status: DC
  Administered 2023-06-03 (×2): 10 [IU] via SUBCUTANEOUS
  Filled 2023-06-03 (×4): qty 0.1

## 2023-06-03 MED ORDER — TRACE MINERALS CU-MN-SE-ZN 300-55-60-3000 MCG/ML IV SOLN
INTRAVENOUS | Status: AC
  Filled 2023-06-03: qty 876

## 2023-06-03 NOTE — Progress Notes (Signed)
Patient ID: [name], male   DOB: [birth year], 62 y.o.   MRN: [id number] S: remains on ecmo. On epi and vaso. Plan for total body ct today.  O:BP 120/66   Pulse 71   Temp 97.9 F (36.6 C) (Oral)   Resp 18   Ht 5\' 6"  (1.676 m)   Wt 82.6 kg   SpO2 100%   BMI 29.39 kg/m   Intake/Output Summary (Last 24 hours) at 06/03/2023 0949 Last data filed at 06/03/2023 0700 Gross per 24 hour  Intake 4364.49 ml  Output 5939.7 ml  Net -1575.21 ml   Intake/Output: I/O last 3 completed shifts: In: 6716.4 [I.V.:4748.3; Blood:610; NG/GT:80; IV Piggyback:1278.1] Out: 9387.7 [Emesis/NG output:200; Stool:70; Chest Tube:120]  Intake/Output this shift:  No intake/output data recorded. Weight change: -3.7 kg JYN:[id number] ill-appearing, intubated and sedated MVH:QIONGE rate, no audible rub Resp:coarse BS bilaterally, ventilated, bilateral chest rise Abd:+BS, soft Ext: trace presacral edema  Recent Labs  Lab 05/28/23 0411 05/28/23 0416 05/29/23 0407 05/29/23 0751 05/30/23 0418 05/30/23 9528 05/23/2023 0418 05/29/2023 0747 05/10/2023 1713 05/22/2023 1800 06/01/23 0424 06/01/23 0433 06/01/23 1617 06/01/23 1627 06/02/23 0403 06/02/23 0433 06/02/23 1207 06/02/23 1553 06/02/23 1601 06/02/23 1846 06/02/23 2150 06/03/23 0405 06/03/23 0410  NA 136   < > 134*   < > 134*   < > 135   < > 137   < > 137   < > 137   < > 136  134*   < > 134* 135 135 136 136 133* 136  K 4.1   < > 3.7   < > 3.7   < > 3.8   < > 3.8   < > 4.2   < > 4.4   < > 4.1  4.1   < > 4.1 4.6 4.3 4.3 4.3 4.6 4.5  CL 103   < > 99   < > 102   < > 101  --  106  --  102  --  97*  --  96*  96*  --   --  97*  --   --   --  97*  --   CO2 23   < > 21*   < > 22   < > 23  --  21*  --  19*  --  25  --  27  32  --   --  24  --   --   --  27  --   GLUCOSE 142*   < > 163*   < > 118*   < > 172*  --  117*  --  204*  --  206*  --  202*  202*  --   --  226*  --   --   --  230*  --   BUN 16   < > 29*   < > 22   < > 14  --  17  --  18  --  20  --  23   23  --   --  23  --   --   --  24*  --   CREATININE 1.13   < > 1.11   < > 1.13   < > 1.06  --  1.26*  --  1.19  --  1.11  --  0.98  1.03  --   --  0.92  --   --   --  0.93  --   ALBUMIN 2.1*   < >  1.9*  2.0*   < > 1.8*   < > 1.9*  1.9*  --  1.8*  --  2.0*  2.0*  --  2.0*  --  1.8*  1.8*  --   --  1.8*  --   --   --  1.9*  --   CALCIUM 8.4*   < > 8.7*   < > 8.3*   < > 8.7*  --  8.9  --  8.1*  --  8.0*  --  8.0*  7.8*  --   --  7.7*  --   --   --  8.1*  --   PHOS 1.8*   < > 2.1*   < > 2.0*   < > 2.8  --  4.8*  --  3.9  --  2.9  --  2.2*  2.2*  --   --  4.3  --   --   --  3.2  --   AST 60*  --  71*  --  76*  --  69*  --   --   --  88*  --   --   --  112*  --   --   --   --   --   --  111*  --   ALT 9  --  8  --  9  --  9  --   --   --  12  --   --   --  10  --   --   --   --   --   --  13  --    < > = values in this interval not displayed.   Liver Function Tests: Recent Labs  Lab 06/01/23 0424 06/01/23 1617 06/02/23 0403 06/02/23 1553 06/03/23 0405  AST 88*  --  112*  --  111*  ALT 12  --  10  --  13  ALKPHOS 80  --  84  --  90  BILITOT 4.4*  --  4.5*  --  4.7*  PROT 5.7*  --  6.0*  --  6.4*  ALBUMIN 2.0*  2.0*   < > 1.8*  1.8* 1.8* 1.9*   < > = values in this interval not displayed.   No results for input(s): "LIPASE", "AMYLASE" in the last 168 hours. Recent Labs  Lab 06/03/23 0742  AMMONIA 26   CBC: Recent Labs  Lab 05/29/23 1722 05/30/23 0002 06/01/23 0424 06/01/23 0433 06/01/23 1617 06/01/23 1627 06/02/23 0403 06/02/23 0433 06/02/23 1553 06/02/23 1601 06/02/23 2150 06/03/23 0405 06/03/23 0410  WBC 49.2*   < > 40.9*  --  43.2*  --  40.1*  --  45.0*  --   --  44.4*  --   NEUTROABS 25.7*  --   --   --   --   --   --   --   --   --   --   --   --   HGB 7.9*   < > 9.4*   < > 9.3*   < > 8.7*   < > 8.7*   < > 8.5* 8.4* 9.2*  HCT 24.5*   < > 27.7*   < > 26.5*   < > 25.5*   < > 25.8*   < > 25.0* 24.9* 27.0*  MCV 90.7   < > 88.2  --  85.8  --  86.1  --  88.7  --    --  88.3  --   PLT 17*   < > 66*  --  38*  --  26*  --  21*  --   --  44*  --    < > = values in this interval not displayed.   Cardiac Enzymes: No results for input(s): "CKTOTAL", "CKMB", "CKMBINDEX", "TROPONINI" in the last 168 hours. CBG: Recent Labs  Lab 06/02/23 1204 06/02/23 1600 06/02/23 1942 06/03/23 0014 06/03/23 0408  GLUCAP 188* 215* 173* 126* 219*    Iron Studies: No results for input(s): "IRON", "TIBC", "TRANSFERRIN", "FERRITIN" in the last 72 hours. Studies/Results: DG Chest Port 1 View  Result Date: 06/02/2023 CLINICAL DATA:  Central line placement. EXAM: PORTABLE CHEST 1 VIEW COMPARISON:  06/02/2023 FINDINGS: Endotracheal tube 2.2 cm above the carina. This could be retracted 3 cm. Stable mediastinal and bilateral chest tubes.  No pneumothorax. Stable right jugular catheter with its tip in the superior vena cava just above the superior cavoatrial junction. Left jugular catheter tip in the superior vena cava without significant change. Interval left subclavian catheter with its tip in the proximal superior vena cava. Stable post CABG changes. Stable enlarged cardiac silhouette. No significant overall change in airspace opacity in both lungs, including dense opacity in the right lung. IMPRESSION: 1. Endotracheal tube 2.2 cm above the carina. This could be retracted 3 cm. 2. Interval left subclavian catheter with its tip in the proximal superior vena cava. 3. No pneumothorax. 4. No significant change in bilateral pneumonia and/or pulmonary edema. Electronically Signed   By: Beckie Salts M.D.   On: 06/02/2023 16:08   DG CHEST PORT 1 VIEW  Result Date: 06/02/2023 CLINICAL DATA:  ECMO. EXAM: PORTABLE CHEST 1 VIEW COMPARISON:  June 01, 2023. FINDINGS: Stable endotracheal and nasogastric tube. Stable bilateral jugular catheters. Stable ECMO device. Stable bilateral chest tubes without pneumothorax. Stable bilateral lung opacities, right greater than left. IMPRESSION: Stable  support apparatus.  Stable bilateral lung opacities. Electronically Signed   By: Lupita Raider M.D.   On: 06/02/2023 10:01    sodium chloride   Intravenous Once   sodium chloride   Intravenous Once   sodium chloride   Intravenous Once   sodium chloride   Intravenous Once   sodium chloride   Intravenous Once   sodium chloride   Intravenous Once   sodium chloride   Intravenous Once   sodium chloride   Intravenous Once   atorvastatin  80 mg Per Tube q1800   bisacodyl  10 mg Oral Daily   Or   bisacodyl  10 mg Rectal Daily   Chlorhexidine Gluconate Cloth  6 each Topical Daily   docusate  100 mg Per Tube BID   ezetimibe  10 mg Per Tube Daily   insulin aspart  0-20 Units Subcutaneous Q4H   insulin detemir  10 Units Subcutaneous BID   metoCLOPramide (REGLAN) injection  5 mg Intravenous Q6H   mouth rinse  15 mL Mouth Rinse Q2H   pantoprazole (PROTONIX) IV  40 mg Intravenous QHS   sodium chloride flush  3 mL Intravenous Q12H    BMET    Component Value Date/Time   NA 136 06/03/2023 0410   NA 143 11/02/2022 1454   K 4.5 06/03/2023 0410   CL 97 (L) 06/03/2023 0405   CO2 27 06/03/2023 0405   GLUCOSE 230 (H) 06/03/2023 0405   BUN 24 (H) 06/03/2023 0405   BUN 31 (H) 11/02/2022 1454   CREATININE 0.93 06/03/2023 0405   CREATININE  2.71 (H) 06/30/2023 0950   CREATININE 1.05 02/24/2015 1601   CALCIUM 8.1 (L) 06/03/2023 0405   GFRNONAA >60 06/03/2023 0405   GFRNONAA 26 (L) Jun 11, 2023 0950   GFRNONAA 80 02/24/2015 1601   GFRAA 62 10/09/2020 1520   GFRAA >89 02/24/2015 1601   CBC    Component Value Date/Time   WBC 44.4 (H) 06/03/2023 0405   RBC 2.82 (L) 06/03/2023 0405   HGB 9.2 (L) 06/03/2023 0410   HGB 6.4 (LL) 06/28/2023 0852   HGB 10.2 (L) 08/12/2022 0934   HCT 27.0 (L) 06/03/2023 0410   HCT 29.2 (L) 08/12/2022 0934   PLT 44 (L) 06/03/2023 0405   PLT 109 (L) Jun 11, 2023 0852   PLT 252 08/12/2022 0934   MCV 88.3 06/03/2023 0405   MCV 94 08/12/2022 0934   MCH 29.8 06/03/2023  0405   MCHC 33.7 06/03/2023 0405   RDW 17.5 (H) 06/03/2023 0405   RDW 15.5 (H) 08/12/2022 0934   LYMPHSABS 5.8 (H) 05/29/2023 1722   LYMPHSABS 1.9 11/04/2021 1028   MONOABS 6.3 (H) 05/29/2023 1722   EOSABS 0.0 05/29/2023 1722   EOSABS 0.2 11/04/2021 1028   BASOSABS 0.0 05/29/2023 1722   BASOSABS 0.1 11/04/2021 1028    Assessment/Plan:   Anuric AKI/dialysis dependent AKI with fluid overload: Started on RRT during previous admission in May 2024 until 05/10/23 (recovered). Patient is status post cardiac surgery on 9/13 and currently on ECMO. CRRT start 9/11. No signs of renal recovery.  UF as tolerated by BP and ECMO, Angiomax for anticoagulation.  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  Per CTS AHRF: VDRF, per primary service. Abx/antifungal per primary service Postcardiotomy vasoplegia/shock with failure to wean: Currently on ECMO per HF team.  On pressors per primary service.  Volume managing with CRRT. S/p washout 9/19. Started on midodrine. Hyperkalemia: resolved Anemia, thrombocytopenia: Transfuse as needed per primary team. Hypophosphatemia - repletion prn

## 2023-06-03 NOTE — Procedures (Signed)
I saw and evaluated the patient on CRRT.  I reviewed the last 24 hours events.  Adjustments to CRRT prescription are made as needed. Ceasar Mons Weights   06/01/23 0500 06/02/23 0500 06/03/23 0630  Weight: 87.3 kg 86.3 kg 82.6 kg    Recent Labs  Lab 06/03/23 0405 06/03/23 0410  NA 133* 136  K 4.6 4.5  CL 97*  --   CO2 27  --   GLUCOSE 230*  --   BUN 24*  --   CREATININE 0.93  --   CALCIUM 8.1*  --   PHOS 3.2  --     Recent Labs  Lab 05/29/23 1722 05/30/23 0002 06/02/23 0403 06/02/23 0433 06/02/23 1553 06/02/23 1601 06/02/23 2150 06/03/23 0405 06/03/23 0410  WBC 49.2*   < > 40.1*  --  45.0*  --   --  44.4*  --   NEUTROABS 25.7*  --   --   --   --   --   --   --   --   HGB 7.9*   < > 8.7*   < > 8.7*   < > 8.5* 8.4* 9.2*  HCT 24.5*   < > 25.5*   < > 25.8*   < > 25.0* 24.9* 27.0*  MCV 90.7   < > 86.1  --  88.7  --   --  88.3  --   PLT 17*   < > 26*  --  21*  --   --  44*  --    < > = values in this interval not displayed.    Scheduled Meds:  sodium chloride   Intravenous Once   sodium chloride   Intravenous Once   sodium chloride   Intravenous Once   sodium chloride   Intravenous Once   sodium chloride   Intravenous Once   sodium chloride   Intravenous Once   sodium chloride   Intravenous Once   sodium chloride   Intravenous Once   atorvastatin  80 mg Per Tube q1800   bisacodyl  10 mg Oral Daily   Or   bisacodyl  10 mg Rectal Daily   Chlorhexidine Gluconate Cloth  6 each Topical Daily   docusate  100 mg Per Tube BID   ezetimibe  10 mg Per Tube Daily   insulin aspart  0-20 Units Subcutaneous Q4H   insulin detemir  10 Units Subcutaneous BID   metoCLOPramide (REGLAN) injection  5 mg Intravenous Q6H   mouth rinse  15 mL Mouth Rinse Q2H   pantoprazole (PROTONIX) IV  40 mg Intravenous QHS   sodium chloride flush  3 mL Intravenous Q12H   Continuous Infusions:   prismasol BGK 4/2.5 400 mL/hr at 06/02/23 2001    prismasol BGK 4/2.5 400 mL/hr at 06/02/23 2131    sodium chloride Stopped (05/24/23 0032)   sodium chloride Stopped (06/01/23 2021)   sodium chloride 10 mL/hr at 06/03/23 0700   albumin human Stopped (05/30/23 0819)   anticoagulant sodium citrate     bivalirudin (ANGIOMAX) 250 mg in sodium chloride 0.9 % 500 mL (0.5 mg/mL) infusion 0.003 mg/kg/hr (06/03/23 0755)   dexmedetomidine (PRECEDEX) IV infusion 0.5 mcg/kg/hr (06/03/23 0851)   epinephrine 6 mcg/min (06/03/23 0700)   HYDROmorphone Stopped (06/02/23 0247)   insulin Stopped (05/21/23 0754)   lactated ringers Stopped (05/27/23 2100)   meropenem (MERREM) IV Stopped (06/03/23 0610)   micafungin (MYCAMINE) 200 mg in sodium chloride 0.9 % 100 mL IVPB 200  mg (06/03/23 0800)   midazolam Stopped (06/02/23 0247)   norepinephrine (LEVOPHED) Adult infusion Stopped (06/01/23 1405)   prismasol BGK 4/2.5 1,500 mL/hr at 06/03/23 0608   TPN ADULT (ION) 75 mL/hr at 06/03/23 0700   TPN ADULT (ION)     vancomycin Stopped (06/02/23 2308)   vasopressin 0.04 Units/min (06/03/23 0804)   PRN Meds:.sodium chloride, sodium chloride, albumin human, anticoagulant sodium citrate, artificial tears, dextrose, HYDROmorphone, midazolam, midazolam, morphine injection, ondansetron (ZOFRAN) IV, mouth rinse, oxidized cellulose, oxyCODONE, sodium chloride flush, traMADol   Louie Bun,  MD 06/03/2023, 9:49 AM

## 2023-06-03 NOTE — Progress Notes (Signed)
Pt transported on Vent from 2H23 to CT and back without any complications. RN at bedside, ECMO specialist at bedside, RT will monitor as needed.

## 2023-06-03 NOTE — Progress Notes (Signed)
PHARMACY - TOTAL PARENTERAL NUTRITION CONSULT NOTE   Indication:  unable to tolerate tube feeding  Patient Measurements: Height: 5\' 6"  (167.6 cm) Weight: 82.6 kg (182 lb 1.6 oz) IBW/kg (Calculated) : 63.8 TPN AdjBW (KG): 69 Body mass index is 29.39 kg/m. Usual Weight: ~90kg  Assessment: 62 yo male with significant PMH for HLD, HTN, T2DM, CKD4 (prev on HD) and recent MSSA bacteremia + endocarditis (May 2024). Back in May 2024 admission, underwent ERCP for cholangitis/pancreatitis. Admitted on 9/11 w/ chest pain found to have aortic valve dehiscence. S/p aortic valve replacement redo on 9/13 - developed pulmonary edema post op and was placed on [place] ECMO, transitioned to VV ECMO 9/24. S/p mediastinal washout on 9/19 - chest still open. Unable to speak w/ patient due to intubation (utilizing Precedex for sedation). Pharmacy consulted to manage TPN for inability to tolerate tube feeding and malnutrition.   Glucose / Insulin: hx T2DM - A1c 6.1%, on Farxiga PTA. Previously on insulin gtt post CT surgery >> transitioned to rSSI q4h. 28 units SSI used/24 hours. CBGs mostly 180-200s. One drop to 126 overnight. Semglee increased yesterday.  Electrolytes: K trending up, Na 133, Phos 3.2 (s/p NaPhos 30 mmol), Mg 2.2 (none in TPN), CoCa 9.7 (added back 9/26), CO2 27, Cl 97 Renal: Hx CKD4 currently on CRRT (required HD in the past), using 4K bags, systemic bivalirudin for anticoag - not using systemic sodium citrate infusion, only for catheter lock per d/w RN at bedside on 9/24 CRRT 9/11 >>  Hepatic: Alk phos 90, AST 111/ALT 13 - both rising slightly, Tbili 4.7, albumin 1.9, TG 122 Intake / Output: 50mL OG output, Trickle TF @ 20 mL/hr stopped 9/22 PM due to inc OG output and no bowel sounds. Receiving Reglan 5mg  IV q6h - Chest tube output 80mL, stool output 35 mL - NGT placed 9/22, Cortrak placed 9/20  MIVF: LR and NS @ KVO GI Imaging:  - None since TPN start  GI Surgeries / Procedures:  - 9/24  transition to VV ECMO  Central access: 9/13 TPN start date: 9/23  Nutritional Goals: Goal concentrated TPN rate is 75 mL/hr (provides 131 g of protein and 2250 kcals per day)  RD Assessment: Estimated Needs Total Energy Estimated Needs: 2200-2400 Total Protein Estimated Needs: 130-150 grams Total Fluid Estimated Needs: 1L plus UOP  Current Nutrition:  NPO and TPN  Plan:  Continue concentrated TPN at 75 mL/hr at 1800 - will provide 2250 kcal and 131 g protein meeting ~100% of needs Electrolytes in TPN: Na 10mEq/L, K 7mEq/L, Ca 39mEq/L, Mg 79mEq/L, and Phos 20 mmol/L. Max chloride (still getting some bicarb) (TPN compounder unable to formulate other Cl:Ac ratios with current formula) Standard MVI and trace elements within TPN No chromium (on CRRT and currently on shortage) Add thiamine to TPN x 5 days for refeeding risk (9/23 >> 9/27) Continue Resistant q4h SSI  Adjust to Levemir 10 units SQ BID per discussion with team - can consider adding to TPN bag once needs determined.  Continue MIVF at [facility] Monitor TPN labs on Mon/Thurs, RFP BID while on CRRT   Rexford Maus, PharmD, BCPS 06/03/2023 7:11 AM

## 2023-06-03 NOTE — Progress Notes (Signed)
Patient ID: [name], male   DOB: 05-10-1961, 62 y.o.   MRN: [id number]   Extracorporeal support note   ECLS support day: 4 Indication: Acute hypoxic respiratory failure  Configuration: Central VV ECMO  30FR Dual-lumen Crescent cannula in RIJ   Pump speed: 3400 rpm  Pump flow: 3.5 L/min Pump used: Cardiohelp  Sweep gas: 8  ABG: 7.46/40/157/99% LDH: 1,333 -> 1,170  Circuit check: Small clot at 12p Anticoagulant: On bival Anticoagulation targets:  PTT 50-60  Changes in support: none  Anticipated goals/duration of support: Wean off VV ECMO as tolerated. Plan pan CT today Discussed on multi-disciplinary rounds  .Arvilla Meres MD  06/03/2023, 7:49 AM

## 2023-06-03 NOTE — Consult Note (Signed)
[facility]    Date of Admission:  05/18/2023     Total days of antibiotics 16               Reason for Consult: Fungal infection    Referring Provider: Antibiotic Stewardship Primary Care Provider: Marcine Matar, MD   ASSESSMENT:  [name] is a 62 y/o AA male with recent history of MSSA bacteremia and prosthetic tricuspid valve endocarditis and CVA with suspected septic emboli s/p treatment with oxacillin and rifampin presenting with chest pain and shortness of breath and found to have aortic valve dehiscence and now s/p aortic and tricuspid valve replacement. Course has been complicated by need for ECMO and inability to come off by-pass. Currently on day 12 of antibiotic therapy with vancomycin, meropenem and micafungin with most recent bronchoscopy showing mold. Previous respiratory culture with candida albicans and mold. Have asked microbiology lab to further evaluate mold. Will check for cryptococcal antigen, aspergillus antibody, toxoplasmosis antibodies and blastomyces antigen. Continue current dose of micafungin pending additional fungal information along with broad spectrum antibiotic coverage with meropenem and vancomycin. Remaining medical and supportive care per Cardiology and CCM.   PLAN:  Continue micafungin, meropenem, and vancomycin.  Check fungal antigen/antibodies Await further information from Microbiology on mold work up. Remaining medical and supportive care per Cardiology and CCM.    Principal Problem:   Cardiogenic shock (HCC) Active Problems:   CAD (coronary artery disease)   Type 2 diabetes mellitus without complication, without long-term current use of insulin (HCC)   OSA on CPAP   Symptomatic anemia   Acute on chronic congestive heart failure (HCC)   Prosthetic aortic valve failure   Acute respiratory failure with hypoxia (HCC)   Anemia   Protein-calorie malnutrition, severe    sodium chloride   Intravenous Once    sodium chloride   Intravenous Once   sodium chloride   Intravenous Once   sodium chloride   Intravenous Once   sodium chloride   Intravenous Once   sodium chloride   Intravenous Once   sodium chloride   Intravenous Once   sodium chloride   Intravenous Once   atorvastatin  80 mg Per Tube q1800   bisacodyl  10 mg Oral Daily   Or   bisacodyl  10 mg Rectal Daily   Chlorhexidine Gluconate Cloth  6 each Topical Daily   docusate  100 mg Per Tube BID   ezetimibe  10 mg Per Tube Daily   insulin aspart  0-20 Units Subcutaneous Q4H   insulin detemir  10 Units Subcutaneous BID   metoCLOPramide (REGLAN) injection  5 mg Intravenous Q6H   mouth rinse  15 mL Mouth Rinse Q2H   pantoprazole (PROTONIX) IV  40 mg Intravenous QHS   sodium chloride flush  3 mL Intravenous Q12H     HPI: [name] is a 62 y.o. male with previous medical history of Type 2 diabetes,CAD s/p CABG x 4, hypertension, and CVA [facility].  [name] began having chest pain, shortness of breath and bilateral lower extremity edema 1 week prior to presentation in the setting of recent discontinuation of hemodialysis due to improved renal function. Found to have aortic valve dehiscence and started on CRRT for volume overload. Brought to the OR on 05/19/2023 for redo aortic valve replacement, ascending aortic root replacement and tricuspid valve repair. Course complicated by inability to tolerate coming off cardiac  by-pass and was placed back on by-pass with decision to cannulate for ECMO. Returned to the OR on 9/19 for mediastinal washout and was unable to wean ECMO. Bronchoscopy performed on 05/25/23 due to mucus plugging and blood clots. Repeat bronchscopy performed on 9/20 secondary to un-resolving pneumonia with respiratory cultures growing mold and candida albicans. Converted to [place] to VV ECOMO on 9/24 and ETT exchange and bronchoscopy on 9/25. Respiratory culture once again with  mold.  [name] was previously treated for MSSA bacteremia complicated by prosthetic tricuspid valve endocarditis in May with oxacillin and rifampin. Course was complicated by cerebellar CVA from suspected septic emboli. Since admission has been afebrile with leukocytosis with most recent being 44.4. Has received the following antibiotics:  Vancomycin 9/15 >> Meropenem 9/15 >> Micafungin 9/20 >> Ceftriaxone 9/11>>9/12>>9/14 >>9/15   Initially started on vancomycin and ceftriaxone which was changed on 05/22/23 to vancomycin and meropenem secondary to WBC count jump. Initial blood cultures were negative and OR valve cultures were without growth.  Review of Systems: Review of Systems  Unable to perform ROS: Intubated     Past Medical History:  Diagnosis Date   Allergy    Anemia    Anxiety    Baker's cyst of knee    Blood transfusion without reported diagnosis    as baby    Coronary artery disease    quadruple bypass - March 2016   GERD (gastroesophageal reflux disease)    Gouty arthritis    "real bad" (01/17/2013)   Heart murmur    Hypercholesteremia    Hypertension    MSSA bacteremia 01/29/2023   Myocardial infarction (HCC) 2017   PEA (Pulseless electrical activity) (HCC) 01/29/2023   Stroke (HCC)    Type II diabetes mellitus (HCC)     Social History   Tobacco Use   Smoking status: Never   Smokeless tobacco: Never  Vaping Use   Vaping status: Never Used  Substance Use Topics   Alcohol use: Not Currently    Comment: occasionally   Drug use: Not Currently    Types: Marijuana    Comment: pt states occasionally    Family History  Problem Relation Age of Onset   Diabetes Father    Hypertension Father    Hypertension Mother    Hypertension Sister    Hypertension Brother    Hypertension Brother    Hypertension Brother    Colon cancer Neg Hx    Esophageal cancer Neg Hx    Rectal cancer Neg Hx    Stomach cancer Neg Hx    Colon polyps Neg Hx     Allergies   Allergen Reactions   Other Anaphylaxis    Mushrooms  Not listed on MAR    Plavix [Clopidogrel Bisulfate] Other (See Comments)    TTP Not listed on the [facility]   Fleet Enema [Enema] Other (See Comments)    Unknown reaction   Lovenox [Enoxaparin] Other (See Comments)    Unknown reaction   Morphine Other (See Comments)    Unknown reaction   Nsaids Other (See Comments)    Unknown reaction   Hydrocodone-Acetaminophen Itching    OBJECTIVE: Blood pressure 120/66, pulse 70, temperature 97.9 F (36.6 C), temperature source Oral, resp. rate (!) 25, height 5\' 6"  (1.676 m), weight 82.6 kg, SpO2 100%.  Physical Exam Constitutional:      General: He is not in acute distress.    Appearance: He is well-developed.  Cardiovascular:     Rate and Rhythm: Normal rate  and regular rhythm.     Heart sounds: Normal heart sounds.  Pulmonary:     Effort: Pulmonary effort is normal.     Breath sounds: Normal breath sounds.     Lab Results Lab Results  Component Value Date   WBC 44.4 (H) 06/03/2023   HGB 8.5 (L) 06/03/2023   HCT 25.0 (L) 06/03/2023   MCV 88.3 06/03/2023   PLT 44 (L) 06/03/2023    Lab Results  Component Value Date   CREATININE 0.93 06/03/2023   BUN 24 (H) 06/03/2023   NA 136 06/03/2023   K 4.5 06/03/2023   CL 97 (L) 06/03/2023   CO2 27 06/03/2023    Lab Results  Component Value Date   ALT 13 06/03/2023   AST 111 (H) 06/03/2023   ALKPHOS 90 06/03/2023   BILITOT 4.7 (H) 06/03/2023     Microbiology: Recent Results (from the past 240 hour(s))  Culture, Respiratory w Gram Stain     Status: Abnormal   Collection Time: 05/27/23  8:37 AM   Specimen: Bronchoalveolar Lavage; Respiratory  Result Value Ref Range Status   Specimen Description BRONCHIAL ALVEOLAR LAVAGE  Final   Special Requests NONE  Final   Gram Stain   Final    ABUNDANT WBC PRESENT, PREDOMINANTLY PMN NO ORGANISMS SEEN Performed at [facility], [address], [place], [place] [address]    Culture  (A)  Final    FUNGUS (MOLD) ISOLATED, PROBABLE CONTAMINANT/COLONIZER (SAPROPHYTE). CONTACT MICROBIOLOGY IF FURTHER IDENTIFICATION REQUIRED [phone].   Report Status 05/30/2023 FINAL  Final  Culture, fungus without smear     Status: Abnormal (Preliminary result)   Collection Time: 05/27/23  8:38 AM   Specimen: Bronchoalveolar Lavage; Other  Result Value Ref Range Status   Specimen Description BRONCHIAL ALVEOLAR LAVAGE  Final   Special Requests NONE  Final   Culture (A)  Final    CANDIDA ALBICANS MOLD CONTINUING TO HOLD Performed at [facility], [address] [place], [place] [address]    Report Status PENDING  Incomplete  Fungus Culture With Stain     Status: Abnormal (Preliminary result)   Collection Time: 05/27/23  8:38 AM   Specimen: Bronchial Alveolar Lavage; Respiratory  Result Value Ref Range Status   Fungus Stain Final report (A)  Final    Comment: (NOTE) Performed At: [facility] [address] [place], [place] [id number] Jolene Schimke MD BM:[id number]    Fungus (Mycology) Culture PENDING  Incomplete   Fungal Source BRONCHIAL ALVEOLAR LAVAGE  Final    Comment: Performed at [facility], [address] [place], [place] [address]  Fungus Culture Result     Status: Abnormal   Collection Time: 05/27/23  8:38 AM  Result Value Ref Range Status   Result 1 Comment (A)  Final    Comment: (NOTE) Fungal elements, such as arthroconidia, hyphal fragments, chlamydoconidia, observed. Performed At: [facility] [address] [place], [place] [id number] Jolene Schimke MD QV:[id number]   Culture, Respiratory w Gram Stain     Status: None   Collection Time: 06/01/23  9:44 AM   Specimen: Bronchoalveolar Lavage; Respiratory  Result Value Ref Range Status   Specimen Description BRONCHIAL ALVEOLAR LAVAGE  Final   Special Requests NONE  Final   Gram Stain   Final    ABUNDANT WBC PRESENT, PREDOMINANTLY PMN NO ORGANISMS SEEN Performed at [facility], [address], [place], [place] [address]    Culture   Final  FEW FUNGUS (MOLD) ISOLATED, PROBABLE CONTAMINANT/COLONIZER (SAPROPHYTE). CONTACT MICROBIOLOGY IF FURTHER IDENTIFICATION REQUIRED [phone].   Report Status 06/03/2023 FINAL  Final     Marcos Eke, NP [facility]  06/03/2023  4:44 PM

## 2023-06-03 NOTE — Plan of Care (Signed)
  Problem: Respiratory: Goal: Ability to maintain a clear airway and adequate ventilation will improve Outcome: Progressing   Problem: Clinical Measurements: Goal: Diagnostic test results will improve Outcome: Progressing Goal: Cardiovascular complication will be avoided Outcome: Progressing   Problem: Nutrition: Goal: Adequate nutrition will be maintained Outcome: Progressing   Problem: Safety: Goal: Ability to remain free from injury will improve Outcome: Progressing

## 2023-06-03 NOTE — Progress Notes (Signed)
      [address]       [place] [address]             [phone]      Intubated, sedated  BP 120/66   Pulse 70   Temp 97.9 F (36.6 C) (Oral)   Resp (!) 25   Ht 5\' 6"  (1.676 m)   Wt 82.6 kg   SpO2 100%   BMI 29.39 kg/m  PCV 24/50%/ 10 PEEP 7.45/37/170 CVP 12   Intake/Output Summary (Last 24 hours) at 06/03/2023 1711 Last data filed at 06/03/2023 1700 Gross per 24 hour  Intake 4265.55 ml  Output 6222.1 ml  Net -1956.55 ml   K 4.5 Hct 25  Head CT- chronic left frontal infarct, no acute findings CT CAP- Consolidation of lungs R>L, mild small bowel wall thickening  Continue current Rx  Greyson Peavy C. Dorris Fetch, MD [facility] [phone]

## 2023-06-03 NOTE — Progress Notes (Signed)
Nutrition Follow-up  DOCUMENTATION CODES:   Severe malnutrition in context of chronic illness  INTERVENTION:   TPN to meet 100% nutritional needs  Pending results of CT abdomen, recommend considering trial of trickle TF. If unable to tolerate gastric feedings, recommend attempting advancement of Cortrak to post pyloric position  NUTRITION DIAGNOSIS:   Severe Malnutrition related to chronic illness as evidenced by severe muscle depletion, moderate fat depletion, edema, severe fat depletion, moderate muscle depletion.  Being addressed via TPN  GOAL:   Patient will meet greater than or equal to 90% of their needs  Met via TPN  MONITOR:   Vent status, Labs, Weight trends, TF tolerance, Skin, I & O's, TPN  REASON FOR ASSESSMENT:   Consult New TPN/TNA  ASSESSMENT:   62 year old male with PMHx of CAD s/p CABG 2017 with AVR, HLD, HTN, prior CVA, T2DM, CKD 4 was previously on HD. Recent admission on 5/25 with AKI and AMS, had PEA arrest with ROSC in 8 minutes, required dialysis, also with MSSA bacteremia associated with tricuspid valve endocarditis, suffered right cerebellar CVA and had cholangitis/pancreatitis requiring ERCP. 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  9/10: Admitted 9/11 Echo showing aortic valve dehiscence, confirmed by TEE; CRRT started for volume overload and known CKD 4 9/13 OR for redo of aortic valve, tricuspid valve repair and ascending aortic root replacement with re-implantation of SVG to RCA; could not come off CPB, cannulated and started on VA ECMO with central cannulation 9/16 TEE with EF 30%, moderate LVH, moderate RV dysfunction with mild enlargement, no endocarditis 9/18 Bronch this AM, TF held due to bilious output from mouth reported 9/19 Mediastinal washout, OG decompression, TF remain on hold 9/20 Bronch: copious secretions in R (pic in chart), mild irritation; abdomen soft, +stool, Cortrak placed,  trickle initiated 9/20 Pivot 1.5 at 20 restarted 9/21 Chest xray worse-dense consolidation RL, Bronch performed with ongoing heavy mucopurulent secretions on right,  Remains on trickles, no BM 9/22 TF at trickles with 1L stool but later held and OG to suction with 2L removed 9/23 TPN initiated Jun 13, 2023 OR for transition from [place] to VV ECMO  Pt remains on vent support, VV ECMO, CRRT, CT Head/C/A/P today Epinephrine at 5, vasopressin at 0.04  NPO, NG to LIS, +flatus, last stool 9/25  Pt remains on TPN at rate of 75 ml/hr providing 131 g of protein, 2250 kcals  Multiple skin tears but no pressure injuries documented  CBGs 212-230 (ICU goal 140-180); noted MD and Pharmacist are addressing  Labs: phosphorus 3.2 (wdl), magnesium 2.2 (wdl), potassium 4.5 (wdl), sodium 136 (wdl) Meds: colace, ss novolog, levemir, reglan  Diet Order:   Diet Order             Diet NPO time specified  Diet effective midnight                   EDUCATION NEEDS:   No education needs have been identified at this time  Skin:  Skin Assessment: Skin Integrity Issues: Skin Integrity Issues:: Other (Comment) Incisions: closed incision left leg Other: chest closed, multiple skin tears  Last BM:  9/25 small  Height:   Ht Readings from Last 1 Encounters:  05/30/23 5\' 6"  (1.676 m)    Weight:   Wt Readings from Last 1 Encounters:  06/03/23 82.6 kg    Ideal Body Weight:  64.5 kg  BMI:  Body mass index is 29.39 kg/m.  Estimated Nutritional  Needs:   Kcal:  2200-2400  Protein:  130-150 grams  Fluid:  1L plus UOP    Romelle Starcher MS, RDN, LDN, CNSC Registered Dietitian 3 Clinical Nutrition RD Pager and On-Call Pager Number Located in [place]

## 2023-06-03 NOTE — Progress Notes (Signed)
ANTICOAGULATION CONSULT NOTE - Follow up Consult  Pharmacy Consult for bivalirudin Indication:  VV ECMO  Allergies  Allergen Reactions   Other Anaphylaxis    Mushrooms  Not listed on MAR    Plavix [Clopidogrel Bisulfate] Other (See Comments)    TTP Not listed on the [facility]   Fleet Enema [Enema] Other (See Comments)    Unknown reaction   Lovenox [Enoxaparin] Other (See Comments)    Unknown reaction   Morphine Other (See Comments)    Unknown reaction   Nsaids Other (See Comments)    Unknown reaction   Hydrocodone-Acetaminophen Itching    Patient Measurements: Height: 5\' 6"  (167.6 cm) Weight: 82.6 kg (182 lb 1.6 oz) IBW/kg (Calculated) : 63.8 Heparin Dosing Weight: 81.3 kg  Vital Signs: Temp: 97.9 F (36.6 C) (09/27 1600) Temp Source: Oral (09/27 1600) Pulse Rate: 70 (09/27 1730)  Labs: Recent Labs    06/01/23 0424 06/01/23 0433 06/02/23 0403 06/02/23 0433 06/02/23 1553 06/02/23 1601 06/03/23 0405 06/03/23 0410 06/03/23 1130 06/03/23 1606 06/03/23 1618  HGB 9.4*   < > 8.7*   < > 8.7*   < > 8.4*   < > 8.5* 8.3* 8.5*  HCT 27.7*   < > 25.5*   < > 25.8*   < > 24.9*   < > 25.0* 25.2* 25.0*  PLT 66*   < > 26*  --  21*  --  44*  --   --  31*  --   APTT  --    < > 53*  --  53*  --  50*  --   --  52*  --   LABPROT 18.9*  --  18.0*  --   --   --  17.6*  --   --   --   --   INR 1.6*  --  1.5*  --   --   --  1.4*  --   --   --   --   CREATININE 1.19   < > 0.98  1.03  --  0.92  --  0.93  --   --  1.05  --    < > = values in this interval not displayed.    Estimated Creatinine Clearance: 72.6 mL/min (by C-G formula based on SCr of 1.05 mg/dL).   Assessment: 62 yom underwent [place]/AVR, TV repair, re-implantation of SVG-RCA complicated with vasoplegia and pulmonary edema requiring central VA ECMO.   On 9/24 patient underwent transition VA to VV ECMO. Bivalirudin continuous infusion was held prior to procedure. Patient received a total of 5 units of PRBC, 5 units of  platelets, and 2 units of FFP. Hgb stable at 9.5 and Plt up to 66. Of note, CRRT was stopped for procedure (~6 hours) but resumed upon arrival back to the floor at 1800.  No AC PTA.  Resumed bivalirudin the morning after arriving back from the OR following ECMO VA > VV.   Received multiple blood products 9/24 and 2 units PRBC 9/26. Slight oozing from R internal jugular site but stable. Single small clot in circuit. No fibrin streaks. LDH 1076 and fibrinogen 482, both elevated but stable.  aPTT remains therapeutic at 52 sec on bivalirudin 0.003 mg/kg/hour.  No issues with circuit. LDH down to 1170, and fibrinogen 463, elevated but stable.   Goal of Therapy:  aPTT 50-70 seconds Monitor platelets by anticoagulation protocol: Yes   Plan:  Continue bivalirudin at 0.003 mg/kg/hr  Monitor q12 hr aPTT and CBC, LDH,  fibrinogen and for s/sx of bleeding.  Trixie Rude, PharmD Clinical Pharmacist 06/03/2023  5:42 PM

## 2023-06-03 NOTE — Progress Notes (Signed)
Daily Progress Note   Patient Name: [name]       Date: 06/03/2023 DOB: July 05, 1961  Age: 62 y.o. MRN#: [id number] Attending Physician: Alleen Borne, MD Primary Care Physician: Marcine Matar, MD Admit Date: 06/05/2023  Reason for Consultation/Follow-up: Patient is on ECMO  Length of Stay: 16  Current Medications: Scheduled Meds:   sodium chloride   Intravenous Once   sodium chloride   Intravenous Once   sodium chloride   Intravenous Once   sodium chloride   Intravenous Once   sodium chloride   Intravenous Once   sodium chloride   Intravenous Once   sodium chloride   Intravenous Once   sodium chloride   Intravenous Once   atorvastatin  80 mg Per Tube q1800   bisacodyl  10 mg Oral Daily   Or   bisacodyl  10 mg Rectal Daily   Chlorhexidine Gluconate Cloth  6 each Topical Daily   docusate  100 mg Per Tube BID   ezetimibe  10 mg Per Tube Daily   insulin aspart  0-20 Units Subcutaneous Q4H   insulin detemir  10 Units Subcutaneous BID   metoCLOPramide (REGLAN) injection  5 mg Intravenous Q6H   mouth rinse  15 mL Mouth Rinse Q2H   pantoprazole (PROTONIX) IV  40 mg Intravenous QHS   sodium chloride flush  3 mL Intravenous Q12H    Continuous Infusions:   prismasol BGK 4/2.5 400 mL/hr at 06/03/23 1026    prismasol BGK 4/2.5 400 mL/hr at 06/03/23 1210   sodium chloride Stopped (05/24/23 0032)   sodium chloride Stopped (06/01/23 2021)   sodium chloride 10 mL/hr at 06/03/23 1700   albumin human Stopped (05/30/23 0819)   anticoagulant sodium citrate     bivalirudin (ANGIOMAX) 250 mg in sodium chloride 0.9 % 500 mL (0.5 mg/mL) infusion 0.003 mg/kg/hr (06/03/23 1700)   dexmedetomidine (PRECEDEX) IV infusion Stopped (06/03/23 1111)   epinephrine 5 mcg/min (06/03/23 1700)    HYDROmorphone Stopped (06/02/23 0247)   insulin Stopped (05/21/23 0754)   lactated ringers Stopped (05/27/23 2100)   meropenem (MERREM) IV Stopped (06/03/23 1414)   micafungin (MYCAMINE) 200 mg in sodium chloride 0.9 % 100 mL IVPB Stopped (06/03/23 0900)   midazolam Stopped (06/02/23 0247)   norepinephrine (LEVOPHED) Adult infusion Stopped (06/01/23 1405)   prismasol BGK 4/2.5 1,500  mL/hr at 06/03/23 1511   TPN ADULT (ION) 75 mL/hr at 06/03/23 1700   TPN ADULT (ION)     vancomycin Stopped (06/02/23 2308)   vasopressin 0.04 Units/min (06/03/23 1700)    PRN Meds: sodium chloride, sodium chloride, albumin human, anticoagulant sodium citrate, artificial tears, dextrose, HYDROmorphone, midazolam, midazolam, morphine injection, ondansetron (ZOFRAN) IV, mouth rinse, oxidized cellulose, oxyCODONE, sodium chloride flush, traMADol  Physical Exam Constitutional:      Appearance: He is ill-appearing.     Interventions: He is intubated.     Comments: ECMO  Pulmonary:     Effort: He is intubated.             Vital Signs: BP 120/66   Pulse 70   Temp 97.9 F (36.6 C) (Oral)   Resp (!) 25   Ht 5\' 6"  (1.676 m)   Wt 82.6 kg   SpO2 100%   BMI 29.39 kg/m  SpO2: SpO2: 100 % O2 Device: O2 Device: Ventilator O2 Flow Rate: O2 Flow Rate (L/min): 4 L/min   Patient Active Problem List   Diagnosis Date Noted   Cardiogenic shock (HCC) 06/01/2023   Protein-calorie malnutrition, severe 05/28/2023   Cardiogenic postoperative shock (HCC) 05/18/2023   Acute on chronic combined systolic and diastolic CHF (congestive heart failure) (HCC) 05/18/2023   Prosthetic aortic valve failure 05/18/2023   Acute respiratory failure with hypoxia (HCC) 05/18/2023   Anemia 05/18/2023   Volume overload 05/09/2023   Acute on chronic congestive heart failure (HCC) 05/15/2023   ESRD (end stage renal disease) on dialysis (HCC) 04/11/2023   Endocarditis of tricuspid valve 03/30/2023   Medication management 03/30/2023    Symptomatic anemia 02/26/2023   Allergy, unspecified, initial encounter 02/25/2023   Anaphylactic shock, unspecified, initial encounter 02/25/2023   Dependence on renal dialysis (HCC) 02/15/2023   Chronic kidney disease, stage 3 unspecified (HCC) 02/15/2023   Hypertensive chronic kidney disease with stage 1 through stage 4 chronic kidney disease, or unspecified chronic kidney disease 02/15/2023   Personal history of transient ischemic attack (TIA), and cerebral infarction without residual deficits 02/15/2023   Sepsis (HCC) 01/29/2023   TTP (thrombotic thrombocytopenic purpura) (HCC) 01/29/2023   MSSA bacteremia 01/29/2023   PEA (Pulseless electrical activity) (HCC) 01/29/2023   Severe sepsis with septic shock (HCC) 01/29/2023   Prosthetic valve endocarditis (HCC) 01/29/2023   Influenza vaccine refused 10/09/2020   OSA on CPAP 02/06/2019   Macroalbuminuric diabetic nephropathy (HCC) 02/06/2019   TIA (transient ischemic attack) 04/10/2018   Colon cancer screening 05/04/2017   Cerebral infarction (HCC) 12/11/2015   Acute ischemic VBA thalamic stroke (HCC)    Atherosclerosis of native coronary artery of native heart without angina pectoris    H/O heart bypass surgery    Benign essential HTN    Type 2 diabetes mellitus without complication, without long-term current use of insulin (HCC)    S/P AVR 11/25/2015   CAD (coronary artery disease)    Chronic periodontitis 11/21/2015   Bicuspid aortic valve    Controlled type 2 diabetes mellitus with diabetic nephropathy, without long-term current use of insulin (HCC)    Essential hypertension 02/24/2015   Dyslipidemia 02/24/2015   GERD (gastroesophageal reflux disease) 05/02/2013   Primary gout 05/02/2013   Allergic rhinitis due to allergen 07/09/2010   Class 1 obesity 07/08/2008    Palliative Care Assessment & Plan   Patient Profile: 62 y.o. male  with past medical history of CAD s/p CABG 2017 w/ AVR (magna ease for bicuspid valve  disease), HLD,  HTN, prior CVA, T2DM, CKD4 was previously on HD admitted to [facility] on 06-14-23 for evaluation of chest pain.    He was found to have an aortic valve dehiscence confirmed by TEE on 05/18/23. Started CRRT for volume overload that day. 05/29/2023 he went to OR for redo of aortic valve, tricuspid valve repair and ascending aortic root replacement with re-implantation of SVG to RCA; could not come off CPB, cannulated and started on VA ECMO with central cannulation   Recent admission on 5/25 with AKI and AMS, had PEA arrest with ROSC in 8 minutes, required dialysis, also with MSSA bacteremia associated with tricuspid valve endocarditis, suffered right cerebellar CVA and had cholangitis/pancreatitis requiring ERCP.   Significant Hospital events/per CCM   06-14-2023 admitted 9/11 echo showing aortic valve dehiscence, confirmed by TEE.  9/11 started (back) on CRRT for volume overload and known CKD 4 9/12 breathing better after CRRT 9/13 to OR for Redo of aortic valve, tricuspid valve repair and ascending aortic root replacement w/ re-implantation of SVG to RCA. Post op TEE EF 55% RV mid-mod HK, AVR/TV repair stable. Could not come off CPB. Worse pulmonary edema. High dose pressors. Cannulated and started on VA ECMO w/ central cannulation  9/16 MDT ECMO rounds this AM, plans to remove more volume, 1 U PRBCs  9/18 bronch, clot and mucus plugs removed  9/19 OR for washout 9/20 bronch 9/21 bronch 9/24 VA to VV, some bleeding and acidemia issues postop 9/25 ett exchange and bronch  Subjective: Patient remains critically ill with multiorgan system failure. No family at bedside.  17:40: Reached out to son [name]. We discuss how difficult this has been for the family. Emotional support offered. They would prefer we call once a week for check ins.  I encouraged him to reach out if needs arise before then.  Recommendations/Plan: Full code Full scope Time for outcomes Encouraged continued discussion re:  goals of care PMT support- as needed   Code Status:    Code Status Orders  (From admission, onward)           Start     Ordered   05/21/23 0806  Full code  Continuous       Comments: Full code with ACLS Protocol WITH NO CHEST COMPRESSIONS  Question:  By:  Answer:  Consent: discussion documented in EHR   05/21/23 0806        Extensive chart review has been completed  including labs, vital signs, imaging, progress/consult notes, orders, medications and available advance directive documents.  Discussed with nursing staff at bedside   Care plan was discussed with bedside RN  Time spent: 25 minutes  Thank you for allowing the Palliative Medicine Team to assist in the care of this patient.   Sherryll Burger, NP  Please contact Palliative Medicine Team phone at [phone] for questions and concerns.

## 2023-06-03 NOTE — Progress Notes (Signed)
Patient ID: [name], male   DOB: 10-Mar-1961, 62 y.o.   MRN: [id number]     Advanced Heart Failure Rounding Note  PCP-Cardiologist: Kristeen Miss, MD   Subjective:    2023-05-24: OR for Bentall/AVR, TV repair, re-implantation of SVG-RCA.  Post-op vasoplegia and pulmonary edema, required central VA ECMO (RA drainage, ascending aorta return.  Post-op TEE EF 55%, mild-moderate RV hypokinesis.  9/14: Blender added to circuit due to high PaO2 9/16: TEE with EF 30%, moderate LVH, moderate RV dysfunction with mild enlargement, stable bioprosthetic aortic valve, repaired TV with mild TR, no endocarditis noted.  9/18: Bronchoscopy with mucus plugs and clots. 9/19: To OR for mediastinal washout.  By TEE, EF 50% with moderate LVH, mild RV enlargement and mild RV dysfunction, stable bioprosthetic aortic valve, repaired TV with mild TR.  He did not tolerate lowering of ECMO flows even with increasing of pressors due to vasoplegia.  9/21: Bronchoscopy  BAL: growing candida and dold 9/24: Decannulated from [facility] ECMO. Chest closed. Transitioned to VV ECMO  On VV ECMO. Circuit stable.   Remains intubated and sedated.   Remains on epi 9 ->^. VP 0.04  On CVVHD pulling -50-100.  On TPN   Off sedation except for Precedex. Hasn't woken up yet.   ABG 7.46/40/157/99% on Sweep 8 and 3.5L flow  On bival PTT 50. No bleeding  Going for pan CT today   Objective:   Weight Range: 82.6 kg Body mass index is 29.39 kg/m.   Vital Signs:   Temp:  [97 F (36.1 C)-98.2 F (36.8 C)] 97.9 F (36.6 C) (09/27 0400) Pulse Rate:  [69-74] 72 (09/27 0715) Resp:  [0-29] 12 (09/27 0715) BP: (120-129)/(66-71) 120/66 (09/26 1800) SpO2:  [99 %-100 %] 100 % (09/27 0715) Arterial Line BP: (95-141)/(45-71) 111/59 (09/27 0715) FiO2 (%):  [50 %] 50 % (09/27 0400) Weight:  [82.6 kg] 82.6 kg (09/27 0630) Last BM Date : 06/01/23  Weight change: Filed Weights   06/01/23 0500 06/02/23 0500 06/03/23 0630  Weight: 87.3 kg 86.3 kg  82.6 kg    Intake/Output:   Intake/Output Summary (Last 24 hours) at 06/03/2023 0739 Last data filed at 06/03/2023 0700 Gross per 24 hour  Intake 4710.5 ml  Output 6439.7 ml  Net -1729.2 ml      Physical Exam    General:  Critically-ill appearing. On vent HEENT: + ETT Neck:  +VV ECMO cannula + swan Cor: Chest dressing. + CTs   Lungs: clear Abdomen: soft, nontender, nondistended. No hepatosplenomegaly. No bruits or masses. Good bowel sounds. Extremities: no cyanosis, clubbing, rash, 1+ edema Neuro: intubated/sedated  Telemetry   Junctional rhythm + PVCs Personally reviewed  Labs    CBC Recent Labs    06/02/23 1553 06/02/23 1601 06/03/23 0405 06/03/23 0410  WBC 45.0*  --  44.4*  --   HGB 8.7*   < > 8.4* 9.2*  HCT 25.8*   < > 24.9* 27.0*  MCV 88.7  --  88.3  --   PLT 21*  --  44*  --    < > = values in this interval not displayed.   Basic Metabolic Panel Recent Labs    15/17/61 0403 06/02/23 0433 06/02/23 1553 06/02/23 1601 06/03/23 0405 06/03/23 0410  NA 136  134*   < > 135   < > 133* 136  K 4.1  4.1   < > 4.6   < > 4.6 4.5  CL 96*  96*  --  97*  --  97*  --   CO2 27  32  --  24  --  27  --   GLUCOSE 202*  202*  --  226*  --  230*  --   BUN 23  23  --  23  --  24*  --   CREATININE 0.98  1.03  --  0.92  --  0.93  --   CALCIUM 8.0*  7.8*  --  7.7*  --  8.1*  --   MG 2.1  --   --   --  2.2  --   PHOS 2.2*  2.2*  --  4.3  --  3.2  --    < > = values in this interval not displayed.   Liver Function Tests Recent Labs    06/02/23 0403 06/02/23 1553 06/03/23 0405  AST 112*  --  111*  ALT 10  --  13  ALKPHOS 84  --  90  BILITOT 4.5*  --  4.7*  PROT 6.0*  --  6.4*  ALBUMIN 1.8*  1.8* 1.8* 1.9*   No results for input(s): "LIPASE", "AMYLASE" in the last 72 hours. Cardiac Enzymes No results for input(s): "CKTOTAL", "CKMB", "CKMBINDEX", "TROPONINI" in the last 72 hours.  BNP: BNP (last 3 results) Recent Labs    05/11/2023 1100  BNP  2,097.0*    ProBNP (last 3 results) No results for input(s): "PROBNP" in the last 8760 hours.   D-Dimer No results for input(s): "DDIMER" in the last 72 hours. Hemoglobin A1C No results for input(s): "HGBA1C" in the last 72 hours.  Fasting Lipid Panel No results for input(s): "CHOL", "HDL", "LDLCALC", "TRIG", "CHOLHDL", "LDLDIRECT" in the last 72 hours.  Thyroid Function Tests No results for input(s): "TSH", "T4TOTAL", "T3FREE", "THYROIDAB" in the last 72 hours.  Invalid input(s): "FREET3"  Other results:   Imaging    DG Chest Port 1 View  Result Date: 06/02/2023 CLINICAL DATA:  Central line placement. EXAM: PORTABLE CHEST 1 VIEW COMPARISON:  06/02/2023 FINDINGS: Endotracheal tube 2.2 cm above the carina. This could be retracted 3 cm. Stable mediastinal and bilateral chest tubes.  No pneumothorax. Stable right jugular catheter with its tip in the superior vena cava just above the superior cavoatrial junction. Left jugular catheter tip in the superior vena cava without significant change. Interval left subclavian catheter with its tip in the proximal superior vena cava. Stable post CABG changes. Stable enlarged cardiac silhouette. No significant overall change in airspace opacity in both lungs, including dense opacity in the right lung. IMPRESSION: 1. Endotracheal tube 2.2 cm above the carina. This could be retracted 3 cm. 2. Interval left subclavian catheter with its tip in the proximal superior vena cava. 3. No pneumothorax. 4. No significant change in bilateral pneumonia and/or pulmonary edema. Electronically Signed   By: Beckie Salts M.D.   On: 06/02/2023 16:08     Medications:     Scheduled Medications:  sodium chloride   Intravenous Once   sodium chloride   Intravenous Once   sodium chloride   Intravenous Once   sodium chloride   Intravenous Once   sodium chloride   Intravenous Once   sodium chloride   Intravenous Once   sodium chloride   Intravenous Once   sodium  chloride   Intravenous Once   atorvastatin  80 mg Per Tube q1800   bisacodyl  10 mg Oral Daily   Or   bisacodyl  10 mg Rectal Daily   Chlorhexidine Gluconate Cloth  6 each Topical Daily   docusate  100 mg Per Tube BID   ezetimibe  10 mg Per Tube Daily   insulin aspart  0-20 Units Subcutaneous Q4H   insulin detemir  10 Units Subcutaneous BID   metoCLOPramide (REGLAN) injection  5 mg Intravenous Q6H   mouth rinse  15 mL Mouth Rinse Q2H   pantoprazole (PROTONIX) IV  40 mg Intravenous QHS   sodium chloride flush  3 mL Intravenous Q12H    Infusions:   prismasol BGK 4/2.5 400 mL/hr at 06/02/23 2001    prismasol BGK 4/2.5 400 mL/hr at 06/02/23 2131   sodium chloride Stopped (05/24/23 0032)   sodium chloride Stopped (06/01/23 2021)   sodium chloride 10 mL/hr at 06/03/23 0700   albumin human Stopped (05/30/23 0819)   anticoagulant sodium citrate     bivalirudin (ANGIOMAX) 250 mg in sodium chloride 0.9 % 500 mL (0.5 mg/mL) infusion 0.003 mg/kg/hr (06/03/23 0700)   dexmedetomidine (PRECEDEX) IV infusion 0.5 mcg/kg/hr (06/03/23 0700)   epinephrine 6 mcg/min (06/03/23 0700)   HYDROmorphone Stopped (06/02/23 0247)   insulin Stopped (05/21/23 0754)   lactated ringers Stopped (05/27/23 2100)   meropenem (MERREM) IV Stopped (06/03/23 0610)   micafungin (MYCAMINE) 200 mg in sodium chloride 0.9 % 100 mL IVPB Stopped (06/02/23 0907)   midazolam Stopped (06/02/23 0247)   norepinephrine (LEVOPHED) Adult infusion Stopped (06/01/23 1405)   prismasol BGK 4/2.5 1,500 mL/hr at 06/03/23 0608   TPN ADULT (ION) 75 mL/hr at 06/03/23 0700   vancomycin Stopped (06/02/23 2308)   vasopressin 0.04 Units/min (06/03/23 0700)    PRN Medications: sodium chloride, sodium chloride, albumin human, anticoagulant sodium citrate, artificial tears, dextrose, HYDROmorphone, midazolam, midazolam, morphine injection, ondansetron (ZOFRAN) IV, mouth rinse, oxidized cellulose, oxyCODONE, sodium chloride flush,  traMADol    Assessment/Plan   1. Post-cardiotomy vasoplegia/shock with failure to wean from CPB: Now on central VA ECMO (06/03/2023) with RA drainage/ascending aorta return. Post-op echo with EF 55%, mild-moderate RV dysfunction. Unable to get images yesterday by TTE.  TEE on 9/16 showed EF 30%, moderate LVH, moderate RV dysfunction with mild enlargement, stable bioprosthetic aortic valve, repaired TV with mild TR, no endocarditis noted.  To OR for mediastinal washout on 9/19.  By TEE 9/19, EF 50% with moderate LVH, mild RV enlargement and mild RV dysfunction, stable bioprosthetic aortic valve, repaired TV with mild TR.  He did not tolerate lowering of ECMO flows even with increasing of pressors due to vasoplegia. CXR with clearing left lung, right lung with persistent airspace disease not clearing with CVVH. Switch from [place] ECMO to VV ECMO with chest closure on 05/24/2023 - Epi weaning 9 -> 6. Continue to wean. Continue VP 0.04.  - Continue to wean sedation. Will stop precedex after CT today - Weight down 8 pounds over night. ABout 2-5 pounds from baseline. Continue to pull on CVVHD -50-75 - LSH 1,300 -> 1,110. Continue bival. Discussed dosing with PharmD personally. 2. Acute hypoxic respiratory failure, post-op: On vent, rest settings. Blender added to keep PaO2 down. Sweep remains 4.5. CXR with some clearing of left lung, right lung with ongoing diffuse airspace disease without change.  Suspect extensive PNA, think we have done what we can with CVVH for lung clearing at this point. Bronchoscopy yesterday with no change in CXR.  -  Now on VV ECMO  - R lung consolidation is severe. BAL from 9/20 + candida and mold - Meropenem/vancomycin/micafungin for coverage of PNA - For CT today - Will need  trach.  - D/w CCM 3. Severe prosthetic AI: Due to previous MSSA endocarditis with partial valve dehiscence s/p re-do AVR/Bentall with reimplantation of SVG-RCA and TV repair 05/19/2023.   - Continuing  meropenem/vancomycin/micafungin  4. CAD s/p CABG/AVR 2017:  s/p AVR/Bentall and re-implantation of SVG-RCA 05/08/2023. - Off ASA with platelets down.  5. ESRD: CVVH as above. Keep negative 6. DM2: management per CCM 7. Heart block: Post-op, now in stable junctional rhythm.   8. H/o CVA: Prior cerebellar CVA.  9. Thrombocytopenia: Post-op, 43 => 38 => 28 ->-> -> 23K -> 26K  -> 44K (got PLTs yesterday).  Suspect inflammatory and hemolysis.  10. Acute blood loss anemia: Post-op, transfuse hgb < 8.  11. ID: H/o MSSA bacteremia.  WBCs 54 => 53 => 45 => 48.7 => 48K => 42 => 53K => 45K => 41K -> 40k -> 44K now.  - Meropenem + vancomycin.  - Micafungin for fungal coverage added 9/20 12. FEN: Receiving TFs 13. Liver failure, post-op - bili 4.7 stable  CRITICAL CARE Performed by: Arvilla Meres  Total critical care time: 45 minutes  Critical care time was exclusive of separately billable procedures and treating other patients.  Critical care was necessary to treat or prevent imminent or life-threatening deterioration.  Critical care was time spent personally by me on the following activities: development of treatment plan with patient and/or surrogate as well as nursing, discussions with consultants, evaluation of patient's response to treatment, examination of patient, obtaining history from patient or surrogate, ordering and performing treatments and interventions, ordering and review of laboratory studies, ordering and review of radiographic studies, pulse oximetry and re-evaluation of patient's condition.    Length of Stay: 16  Arvilla Meres, MD  06/03/2023, 7:39 AM  Advanced Heart Failure Team Pager [phone] (M-F; 7a - 5p)  Please contact [facility] for night-coverage after hours (5p -7a ) and weekends on [email or web address]

## 2023-06-03 NOTE — Progress Notes (Signed)
NAME:  [name], MRN:  [id number], DOB:  March 23, 1961, LOS: 16 ADMISSION DATE:  05/24/2023, CONSULTATION DATE:  9/11 REFERRING MD:  Dr. Izora Ribas, CHIEF COMPLAINT:  aortic valve dehiscence   History of Present Illness:  Patient is [name] w/ pertinent PMH CAD s/p CABG 2017 w/ AVR, HLD, HTN, prior CVA, T2DM, CKD4 was previously on HD presents to [facility] on 9/10 chest pain.  Patient recently admitted to [facility] on 5/25 w/ AKI and AMS. While in ED patient had PEA arrest w/ ROSC in about 8 minutes. Patient required dialysis on this admission. Also w/ MSSA bacteremia associated w/ tricuspid valve endocarditis. Patient transferred to [facility] on 5/25. Treated w/ 6 week course oxacillin and rifampin. This admission also suffered a R cerebellar CVA w/ MRI likely septic emboli and had cholangitis/pancreatitis requiring ERCP.  On 9/10 patient admitted to [facility] w/ chest pain, dyspnea, and BLE edema. BNP 2,097. CXR w/ pulm edema. Patient started on IV lasix infusion. Cards consulted. On 9/11 patient transferred to [facility]. Patient's echo showing possible aortic valve dehiscence. Cultures repeated and started on rocephin. Patient transferred to ICU for TEE and general anesthesia and to start CRRT. PCCM consulted.  Pertinent  Medical History   Past Medical History:  Diagnosis Date   Allergy    Anemia    Anxiety    Baker's cyst of knee    Blood transfusion without reported diagnosis    as baby    Coronary artery disease    quadruple bypass - March 2016   GERD (gastroesophageal reflux disease)    Gouty arthritis    "real bad" (01/17/2013)   Heart murmur    Hypercholesteremia    Hypertension    MSSA bacteremia 01/29/2023   Myocardial infarction (HCC) 2017   PEA (Pulseless electrical activity) (HCC) 01/29/2023   Stroke (HCC)    Type II diabetes mellitus (HCC)     Significant Hospital Events: Including procedures, antibiotic start and stop dates in addition to other pertinent events    9/10 admitted 9/11 echo showing aortic valve dehiscence, confirmed by TEE.  9/11 started (back) on CRRT for volume overload and known CKD 4 9/12 breathing better after CRRT 9/13 to OR for Redo of aortic valve, tricuspid valve repair and ascending aortic root replacement w/ re-implantation of SVG to RCA. Post op TEE EF 55% RV mid-mod HK, AVR/TV repair stable. Could not come off CPB. Worse pulmonary edema. High dose pressors. Cannulated and started on VA ECMO w/ central cannulation  9/16 MDT ECMO rounds this AM, plans to remove more volume, 1 U PRBCs  9/18 bronch, clot and mucus plugs removed  June 09, 2023 OR for washout  9/20 bronch 9/21 bronch 9/24 VA to VV, some bleeding and acidemia issues postop 9/25 ett exchange and bronch  Interim History / Subjective:  No events. Triggering vent. Still not waking up much.  Objective   Blood pressure 120/66, pulse 72, temperature 97.9 F (36.6 C), temperature source Oral, resp. rate 12, height 5\' 6"  (1.676 m), weight 82.6 kg, SpO2 100%. PAP: (49-56)/(25-33) 56/30 CVP:  [12 mmHg-23 mmHg] 12 mmHg  Vent Mode: PCV FiO2 (%):  [50 %] 50 % Set Rate:  [24 bmp] 24 bmp Vt Set:  [400 mL] 400 mL PEEP:  [10 cmH20] 10 cmH20 Plateau Pressure:  [26 cmH20-28 cmH20] 28 cmH20   Intake/Output Summary (Last 24 hours) at 06/03/2023 0729 Last data filed at 06/03/2023 0700 Gross per 24 hour  Intake 4710.5 ml  Output  6439.7 ml  Net -1729.2 ml   Filed Weights   06/01/23 0500 06/02/23 0500 06/03/23 0630  Weight: 87.3 kg 86.3 kg 82.6 kg    Examination: No distress Triggers vent Pupils equal Global anasarca Cannula and CVL sites look okay L groin femoral A line Lungs remains coarse Pupils small, reactive  CXR stable ARDS pattern Rising bili Plts better Stable CBC, CMP Alkalotic on 9 sweep  Assessment & Plan:   Post cardiotomy vasoplegia/cardioplegia w/ failure to wean from CPB- improved; 9/24 converted to VV Acute on chronic combined HF, Hx MSSA  endocarditis- S/p AVR/Bentall and reimplantation of Coronaries and TV repair 9/13 Acute respiratory failure w/ hypoxia due to volume overload, with untreated OSA, and dense nonresolving R infiltrate CKD 4. Had recently been on HD- now on CRRT Circuit related hemolysis, thrombocytopenia consumptive- stable TF intolerance- ongoing issue now on TPN CAD s/p CABG in 2017 w/ AVR HTN HLD Hx of CVA DMT2 Leukocytosis, multifactorial   - VV ECMO for pH 7.35-7.45 - Rest settings on vent, BP is very sensitive to peep increases while supine likely related to RA return; able to tolerate increased PEEP when left lung down - CRRT pull as tolerated by chugging and pressor need - Pressors for MAP 65 - TPN per pharmD, NGT to LIS; still absent BS - Broad spectrum abx, duration TBD; f/u BAL data - Insulin PRN - Hold sedation; may take a while for versed to get out of system - Bival for [facility], plts only if bleeding - CT Head/C/A/P today - May need to consider steroids - Discussed above with AHF+TCTS+ECMO specialists  Best Practice (right click and "Reselect all SmartList Selections" daily)   Diet/type: TPN DVT prophylaxis: bival GI prophylaxis: PPI Lines: Central line and Dialysis Catheter Foley: yes Code Status:  full code Last date of multidisciplinary goals of care discussion [per TCTS and AHF]  33 min cc time Myrla Halsted MD [facility] 06/03/2023 7:29 AM

## 2023-06-04 ENCOUNTER — Inpatient Hospital Stay (HOSPITAL_COMMUNITY): Payer: PPO

## 2023-06-04 DIAGNOSIS — D649 Anemia, unspecified: Secondary | ICD-10-CM | POA: Diagnosis not present

## 2023-06-04 DIAGNOSIS — R739 Hyperglycemia, unspecified: Secondary | ICD-10-CM | POA: Diagnosis not present

## 2023-06-04 DIAGNOSIS — J80 Acute respiratory distress syndrome: Secondary | ICD-10-CM

## 2023-06-04 DIAGNOSIS — I25810 Atherosclerosis of coronary artery bypass graft(s) without angina pectoris: Secondary | ICD-10-CM | POA: Diagnosis not present

## 2023-06-04 DIAGNOSIS — Z9911 Dependence on respirator [ventilator] status: Secondary | ICD-10-CM

## 2023-06-04 DIAGNOSIS — R570 Cardiogenic shock: Secondary | ICD-10-CM | POA: Diagnosis not present

## 2023-06-04 DIAGNOSIS — T8201XA Breakdown (mechanical) of heart valve prosthesis, initial encounter: Secondary | ICD-10-CM | POA: Diagnosis not present

## 2023-06-04 LAB — CBC
HCT: 26.1 % — ABNORMAL LOW (ref 39.0–52.0)
HCT: 26.6 % — ABNORMAL LOW (ref 39.0–52.0)
Hemoglobin: 8.6 g/dL — ABNORMAL LOW (ref 13.0–17.0)
Hemoglobin: 8.5 g/dL — ABNORMAL LOW (ref 13.0–17.0)
MCH: 29.7 pg (ref 26.0–34.0)
MCH: 29.9 pg (ref 26.0–34.0)
MCHC: 33 g/dL (ref 30.0–36.0)
MCHC: 32 g/dL (ref 30.0–36.0)
MCV: 90 fL (ref 80.0–100.0)
MCV: 93.7 fL (ref 80.0–100.0)
Platelets: 25 10*3/uL — CL (ref 150–400)
Platelets: 26 10*3/uL — CL (ref 150–400)
RBC: 2.9 MIL/uL — ABNORMAL LOW (ref 4.22–5.81)
RBC: 2.84 MIL/uL — ABNORMAL LOW (ref 4.22–5.81)
RDW: 17.6 % — ABNORMAL HIGH (ref 11.5–15.5)
RDW: 17.7 % — ABNORMAL HIGH (ref 11.5–15.5)
WBC: 41.8 10*3/uL — ABNORMAL HIGH (ref 4.0–10.5)
WBC: 41.9 10*3/uL — ABNORMAL HIGH (ref 4.0–10.5)
nRBC: 0.9 % — ABNORMAL HIGH (ref 0.0–0.2)
nRBC: 1 % — ABNORMAL HIGH (ref 0.0–0.2)

## 2023-06-04 LAB — RENAL FUNCTION PANEL
Albumin: 2 g/dL — ABNORMAL LOW (ref 3.5–5.0)
Albumin: 2 g/dL — ABNORMAL LOW (ref 3.5–5.0)
Anion gap: 11 (ref 5–15)
Anion gap: 13 (ref 5–15)
BUN: 29 mg/dL — ABNORMAL HIGH (ref 8–23)
BUN: 31 mg/dL — ABNORMAL HIGH (ref 8–23)
CO2: 23 mmol/L (ref 22–32)
CO2: 23 mmol/L (ref 22–32)
Calcium: 8.2 mg/dL — ABNORMAL LOW (ref 8.9–10.3)
Calcium: 8.3 mg/dL — ABNORMAL LOW (ref 8.9–10.3)
Chloride: 101 mmol/L (ref 98–111)
Chloride: 99 mmol/L (ref 98–111)
Creatinine, Ser: 1.06 mg/dL (ref 0.61–1.24)
Creatinine, Ser: 1.06 mg/dL (ref 0.61–1.24)
GFR, Estimated: >60 mL/min (ref >60)
GFR, Estimated: >60 mL/min (ref >60)
Glucose, Bld: 196 mg/dL — ABNORMAL HIGH (ref 70–99)
Glucose, Bld: 228 mg/dL — ABNORMAL HIGH (ref 70–99)
Phosphorus: 3.4 mg/dL (ref 2.5–4.6)
Phosphorus: 3.9 mg/dL (ref 2.5–4.6)
Potassium: 4.4 mmol/L (ref 3.5–5.1)
Potassium: 4.7 mmol/L (ref 3.5–5.1)
Sodium: 135 mmol/L (ref 135–145)
Sodium: 135 mmol/L (ref 135–145)

## 2023-06-04 LAB — POCT I-STAT 7, (LYTES, BLD GAS, ICA,H+H)
Acid-Base Excess: 0 mmol/L (ref 0.0–2.0)
Acid-Base Excess: 1 mmol/L (ref 0.0–2.0)
Acid-Base Excess: 1 mmol/L (ref 0.0–2.0)
Acid-base deficit: 1 mmol/L (ref 0.0–2.0)
Acid-base deficit: 2 mmol/L (ref 0.0–2.0)
Acid-base deficit: 3 mmol/L — ABNORMAL HIGH (ref 0.0–2.0)
Bicarbonate: 22.8 mmol/L (ref 20.0–28.0)
Bicarbonate: 24.5 mmol/L (ref 20.0–28.0)
Bicarbonate: 24.6 mmol/L (ref 20.0–28.0)
Bicarbonate: 24.8 mmol/L (ref 20.0–28.0)
Bicarbonate: 25.2 mmol/L (ref 20.0–28.0)
Bicarbonate: 26.6 mmol/L (ref 20.0–28.0)
Calcium, Ion: 1.12 mmol/L — ABNORMAL LOW (ref 1.15–1.40)
Calcium, Ion: 1.13 mmol/L — ABNORMAL LOW (ref 1.15–1.40)
Calcium, Ion: 1.15 mmol/L (ref 1.15–1.40)
Calcium, Ion: 1.16 mmol/L (ref 1.15–1.40)
Calcium, Ion: 1.16 mmol/L (ref 1.15–1.40)
Calcium, Ion: 1.17 mmol/L (ref 1.15–1.40)
HCT: 27 % — ABNORMAL LOW (ref 39.0–52.0)
HCT: 27 % — ABNORMAL LOW (ref 39.0–52.0)
HCT: 27 % — ABNORMAL LOW (ref 39.0–52.0)
HCT: 27 % — ABNORMAL LOW (ref 39.0–52.0)
HCT: 27 % — ABNORMAL LOW (ref 39.0–52.0)
HCT: 29 % — ABNORMAL LOW (ref 39.0–52.0)
Hemoglobin: 9.2 g/dL — ABNORMAL LOW (ref 13.0–17.0)
Hemoglobin: 9.2 g/dL — ABNORMAL LOW (ref 13.0–17.0)
Hemoglobin: 9.2 g/dL — ABNORMAL LOW (ref 13.0–17.0)
Hemoglobin: 9.2 g/dL — ABNORMAL LOW (ref 13.0–17.0)
Hemoglobin: 9.2 g/dL — ABNORMAL LOW (ref 13.0–17.0)
Hemoglobin: 9.9 g/dL — ABNORMAL LOW (ref 13.0–17.0)
O2 Saturation: 100 %
O2 Saturation: 100 %
O2 Saturation: 99 %
O2 Saturation: 99 %
O2 Saturation: 99 %
O2 Saturation: 99 %
Patient temperature: 36.2
Patient temperature: 36.5
Patient temperature: 36.8
Patient temperature: 36.8
Patient temperature: 36.8
Patient temperature: 36.8
Potassium: 4.3 mmol/L (ref 3.5–5.1)
Potassium: 4.3 mmol/L (ref 3.5–5.1)
Potassium: 4.3 mmol/L (ref 3.5–5.1)
Potassium: 4.4 mmol/L (ref 3.5–5.1)
Potassium: 4.5 mmol/L (ref 3.5–5.1)
Potassium: 4.7 mmol/L (ref 3.5–5.1)
Sodium: 134 mmol/L — ABNORMAL LOW (ref 135–145)
Sodium: 135 mmol/L (ref 135–145)
Sodium: 135 mmol/L (ref 135–145)
Sodium: 136 mmol/L (ref 135–145)
Sodium: 136 mmol/L (ref 135–145)
Sodium: 138 mmol/L (ref 135–145)
TCO2: 24 mmol/L (ref 22–32)
TCO2: 26 mmol/L (ref 22–32)
TCO2: 26 mmol/L (ref 22–32)
TCO2: 26 mmol/L (ref 22–32)
TCO2: 26 mmol/L (ref 22–32)
TCO2: 28 mmol/L (ref 22–32)
pCO2 arterial: 39.3 mm[Hg] (ref 32–48)
pCO2 arterial: 40.1 mm[Hg] (ref 32–48)
pCO2 arterial: 42.4 mm[Hg] (ref 32–48)
pCO2 arterial: 43.5 mm[Hg] (ref 32–48)
pCO2 arterial: 45.8 mm[Hg] (ref 32–48)
pCO2 arterial: 45.9 mm[Hg] (ref 32–48)
pH, Arterial: 7.334 — ABNORMAL LOW (ref 7.35–7.45)
pH, Arterial: 7.337 — ABNORMAL LOW (ref 7.35–7.45)
pH, Arterial: 7.359 (ref 7.35–7.45)
pH, Arterial: 7.371 (ref 7.35–7.45)
pH, Arterial: 7.396 (ref 7.35–7.45)
pH, Arterial: 7.414 (ref 7.35–7.45)
pO2, Arterial: 153 mm[Hg] — ABNORMAL HIGH (ref 83–108)
pO2, Arterial: 157 mm[Hg] — ABNORMAL HIGH (ref 83–108)
pO2, Arterial: 160 mm[Hg] — ABNORMAL HIGH (ref 83–108)
pO2, Arterial: 164 mm[Hg] — ABNORMAL HIGH (ref 83–108)
pO2, Arterial: 191 mm[Hg] — ABNORMAL HIGH (ref 83–108)
pO2, Arterial: 205 mm[Hg] — ABNORMAL HIGH (ref 83–108)

## 2023-06-04 LAB — APTT
aPTT: 48 s — ABNORMAL HIGH (ref 24–36)
aPTT: 47 s — ABNORMAL HIGH (ref 24–36)

## 2023-06-04 LAB — HEPATIC FUNCTION PANEL
ALT: 16 U/L (ref 0–44)
AST: 108 U/L — ABNORMAL HIGH (ref 15–41)
Albumin: 2 g/dL — ABNORMAL LOW (ref 3.5–5.0)
Alkaline Phosphatase: 90 U/L (ref 38–126)
Bilirubin, Direct: 2.8 mg/dL — ABNORMAL HIGH (ref 0.0–0.2)
Indirect Bilirubin: 1.9 mg/dL — ABNORMAL HIGH (ref 0.3–0.9)
Total Bilirubin: 4.7 mg/dL — ABNORMAL HIGH (ref 0.3–1.2)
Total Protein: 7.5 g/dL (ref 6.5–8.1)

## 2023-06-04 LAB — GLUCOSE, CAPILLARY
Glucose-Capillary: 185 mg/dL — ABNORMAL HIGH (ref 70–99)
Glucose-Capillary: 191 mg/dL — ABNORMAL HIGH (ref 70–99)
Glucose-Capillary: 198 mg/dL — ABNORMAL HIGH (ref 70–99)
Glucose-Capillary: 212 mg/dL — ABNORMAL HIGH (ref 70–99)
Glucose-Capillary: 222 mg/dL — ABNORMAL HIGH (ref 70–99)
Glucose-Capillary: 223 mg/dL — ABNORMAL HIGH (ref 70–99)
Glucose-Capillary: 260 mg/dL — ABNORMAL HIGH (ref 70–99)

## 2023-06-04 LAB — COOXEMETRY PANEL
Carboxyhemoglobin: 3.1 % — ABNORMAL HIGH (ref 0.5–1.5)
Methemoglobin: <0.7 % (ref 0.0–1.5)
O2 Saturation: 74.3 %
Total hemoglobin: 8.8 g/dL — ABNORMAL LOW (ref 12.0–16.0)

## 2023-06-04 LAB — MAGNESIUM: Magnesium: 2.3 mg/dL (ref 1.7–2.4)

## 2023-06-04 LAB — PROTIME-INR
INR: 1.3 — ABNORMAL HIGH (ref 0.8–1.2)
Prothrombin Time: 16.2 s — ABNORMAL HIGH (ref 11.4–15.2)

## 2023-06-04 LAB — AMMONIA: Ammonia: 40 umol/L — ABNORMAL HIGH (ref 9–35)

## 2023-06-04 LAB — FIBRINOGEN: Fibrinogen: 530 mg/dL — ABNORMAL HIGH (ref 210–475)

## 2023-06-04 LAB — CG4 I-STAT (LACTIC ACID): Lactic Acid, Venous: 1.8 mmol/L (ref 0.5–1.9)

## 2023-06-04 LAB — LACTATE DEHYDROGENASE: LDH: 1150 U/L — ABNORMAL HIGH (ref 98–192)

## 2023-06-04 MED ORDER — DIPHENHYDRAMINE HCL 25 MG PO CAPS: 25.0000 mg | ORAL_CAPSULE | Freq: Every day | ORAL | Status: DC | PRN

## 2023-06-04 MED ORDER — INSULIN DETEMIR 100 UNIT/ML ~~LOC~~ SOLN
12.0000 [IU] | Freq: Two times a day (BID) | SUBCUTANEOUS | Status: DC
  Administered 2023-06-04 (×2): 12 [IU] via SUBCUTANEOUS
  Filled 2023-06-04 (×4): qty 0.12

## 2023-06-04 MED ORDER — TRACE MINERALS CU-MN-SE-ZN 300-55-60-3000 MCG/ML IV SOLN
INTRAVENOUS | Status: AC
  Filled 2023-06-04: qty 876

## 2023-06-04 MED ORDER — DEXTROSE 5 % IV SOLN
3.0000 mg/kg | INTRAVENOUS | Status: DC
  Administered 2023-06-04 – 2023-06-07 (×4): 240 mg via INTRAVENOUS
  Filled 2023-06-04 (×4): qty 60

## 2023-06-04 MED ORDER — DIPHENHYDRAMINE HCL 50 MG/ML IJ SOLN: 25.0000 mg | Freq: Every day | INTRAMUSCULAR | Status: DC | PRN

## 2023-06-04 MED ORDER — DEXTROSE 5% FOR FLUSHING BEFORE AND AFTER AMBISOME
10.0000 mL | INTRAVENOUS | Status: DC
  Filled 2023-06-04: qty 250

## 2023-06-04 MED ORDER — SODIUM CHLORIDE 0.9 % IV BOLUS FOR AMBISOME: 250.0000 mL | INTRAVENOUS | Status: DC

## 2023-06-04 MED ORDER — MEPERIDINE HCL 25 MG/ML IJ SOLN: 25.0000 mg | INTRAMUSCULAR | Status: DC | PRN

## 2023-06-04 MED ORDER — DEXTROSE 5% FOR FLUSHING BEFORE AND AFTER AMBISOME
10.0000 mL | INTRAVENOUS | Status: DC
  Administered 2023-06-04: 10 mL via INTRAVENOUS
  Filled 2023-06-04 (×2): qty 250

## 2023-06-04 MED ORDER — ACETAMINOPHEN 325 MG PO TABS: 650.0000 mg | ORAL_TABLET | Freq: Every day | ORAL | Status: DC | PRN

## 2023-06-04 MED ORDER — INSULIN ASPART 100 UNIT/ML IJ SOLN
3.0000 [IU] | INTRAMUSCULAR | Status: DC
  Administered 2023-06-04 – 2023-06-05 (×3): 3 [IU] via SUBCUTANEOUS

## 2023-06-04 MED ORDER — DEXTROSE 5 % IV SOLN
3.0000 mg/kg | INTRAVENOUS | Status: DC
  Filled 2023-06-04: qty 60

## 2023-06-04 MED ORDER — SODIUM CHLORIDE 0.9 % IV SOLN
300.0000 mg | INTRAVENOUS | Status: DC
  Filled 2023-06-04: qty 16.7

## 2023-06-04 MED ORDER — ARTIFICIAL TEARS OPHTHALMIC OINT
TOPICAL_OINTMENT | Freq: Three times a day (TID) | OPHTHALMIC | Status: DC
  Administered 2023-06-04 – 2023-06-07 (×4): 1 via OPHTHALMIC
  Filled 2023-06-04 (×2): qty 3.5

## 2023-06-04 MED ORDER — CEFAZOLIN SODIUM-DEXTROSE 2-4 GM/100ML-% IV SOLN
2.0000 g | Freq: Two times a day (BID) | INTRAVENOUS | Status: DC
  Administered 2023-06-04 – 2023-06-11 (×15): 2 g via INTRAVENOUS
  Filled 2023-06-04 (×15): qty 100

## 2023-06-04 MED ORDER — SODIUM CHLORIDE 0.9 % IV BOLUS FOR AMBISOME
250.0000 mL | INTRAVENOUS | Status: DC
  Administered 2023-06-04 – 2023-06-07 (×4): 250 mL via INTRAVENOUS

## 2023-06-04 MED ORDER — DEXTROSE 5% FOR FLUSHING BEFORE AND AFTER AMBISOME
10.0000 mL | INTRAVENOUS | Status: DC
  Administered 2023-06-04 – 2023-06-07 (×4): 10 mL via INTRAVENOUS
  Filled 2023-06-04 (×6): qty 250

## 2023-06-04 MED ORDER — SODIUM CHLORIDE 0.9 % IV SOLN
200.0000 mg | INTRAVENOUS | Status: DC
  Administered 2023-06-04 – 2023-06-05 (×2): 200 mg via INTRAVENOUS
  Filled 2023-06-04 (×4): qty 10

## 2023-06-04 MED ORDER — SODIUM CHLORIDE 0.9 % IV SOLN
300.0000 mg | Freq: Two times a day (BID) | INTRAVENOUS | Status: DC
  Filled 2023-06-04 (×2): qty 16.7

## 2023-06-04 MED ORDER — SODIUM CHLORIDE 0.9 % IV BOLUS FOR AMBISOME
250.0000 mL | INTRAVENOUS | Status: DC
  Administered 2023-06-04 – 2023-06-06 (×3): 250 mL via INTRAVENOUS

## 2023-06-04 NOTE — Progress Notes (Signed)
ANTICOAGULATION CONSULT NOTE - Follow up Consult  Pharmacy Consult for bivalirudin Indication:  VV ECMO  Allergies  Allergen Reactions   Other Anaphylaxis    Mushrooms  Not listed on MAR    Plavix [Clopidogrel Bisulfate] Other (See Comments)    TTP Not listed on the [facility]   Fleet Enema [Enema] Other (See Comments)    Unknown reaction   Lovenox [Enoxaparin] Other (See Comments)    Unknown reaction   Morphine Other (See Comments)    Unknown reaction   Nsaids Other (See Comments)    Unknown reaction   Hydrocodone-Acetaminophen Itching    Patient Measurements: Height: 5\' 6"  (167.6 cm) Weight: 80.5 kg (177 lb 7.5 oz) IBW/kg (Calculated) : 63.8 Heparin Dosing Weight: 81.3 kg  Vital Signs: Temp: 97.2 F (36.2 C) (09/28 1700) Temp Source: Oral (09/28 1130) Pulse Rate: 62 (09/28 1700)  Labs: Recent Labs    06/02/23 0403 06/02/23 0433 06/03/23 0405 06/03/23 0410 06/03/23 1606 06/03/23 1618 06/04/23 0413 06/04/23 0843 06/04/23 1604 06/04/23 1620 06/04/23 1732  HGB 8.7*   < > 8.4*   < > 8.3*   < > 8.6*  9.9*   < > 8.5* 9.2* 9.2*  HCT 25.5*   < > 24.9*   < > 25.2*   < > 26.1*  29.0*   < > 26.6* 27.0* 27.0*  PLT 26*   < > 44*  --  31*  --  25*  --  26*  --   --   APTT 53*   < > 50*  --  52*  --  48*  --  47*  --   --   LABPROT 18.0*  --  17.6*  --   --   --  16.2*  --   --   --   --   INR 1.5*  --  1.4*  --   --   --  1.3*  --   --   --   --   CREATININE 0.98  1.03   < > 0.93  --  1.05  --  1.06  --  1.06  --   --    < > = values in this interval not displayed.    Estimated Creatinine Clearance: 71.1 mL/min (by C-G formula based on SCr of 1.06 mg/dL).   Assessment: 62 yom underwent Bentall/AVR, TV repair, re-implantation of SVG-RCA complicated with vasoplegia and pulmonary edema requiring central VA ECMO.   On 9/24 patient underwent transition VA to VV ECMO. Bivalirudin continuous infusion was held prior to procedure. Patient received a total of 5 units of PRBC, 5  units of platelets, and 2 units of FFP. Hgb stable at 9.5 and Plt up to 66. Of note, CRRT was stopped for procedure (~6 hours) but resumed upon arrival back to the floor at 1800.  No AC PTA.  Resumed bivalirudin the morning after arriving back from the OR following ECMO VA > VV.   No clots in circuit. No fibrin streaks. LDH and fibrinogen both elevated but stable.  aPTT this morning is subtherapeutic at 48 sec on bivalirudin 0.003 mg/kg/hour.   9/28 PM Update: aPTT 47 sec remains subtherapeutic after increase to bivalirudin 0.004 mg/kg/hour.   Goal of Therapy:  aPTT 50-70 seconds Monitor platelets by anticoagulation protocol: Yes   Plan:  Increase bivalirudin at 0.005 mg/kg/hr  Check aPTT at 0500 Monitor q12 hr aPTT and CBC, LDH, fibrinogen and for s/sx of bleeding.  Trixie Rude, PharmD Clinical Pharmacist 06/04/2023  6:01 PM

## 2023-06-04 NOTE — Progress Notes (Signed)
4 Days Post-Op Procedure(s) (LRB): CONVERSION TO VV ECMO (EXTRACORPOREAL MEMBRANE OXYGENATION) (N/A) DECANNULATION FOR ECMO (N/A) TRANSESOPHAGEAL ECHOCARDIOGRAM (N/A) Subjective: Intubated, not waking up yet  Objective: Vital signs in last 24 hours: Temp:  [97.9 F (36.6 C)-98.2 F (36.8 C)] 98.2 F (36.8 C) (09/28 0400) Pulse Rate:  [60-73] 60 (09/28 0900) Cardiac Rhythm: Junctional rhythm;Bundle branch block (09/28 0800) Resp:  [0-35] 31 (09/28 0900) SpO2:  [98 %-100 %] 100 % (09/28 0900) Arterial Line BP: (100-141)/(46-74) 115/52 (09/28 0900) FiO2 (%):  [40 %-50 %] 40 % (09/28 0829) Weight:  [80.5 kg] 80.5 kg (09/28 0500)  Hemodynamic parameters for last 24 hours: CVP:  [10 mmHg-89 mmHg] 10 mmHg  Intake/Output from previous day: 09/27 0701 - 09/28 0700 In: 3377.2 [I.V.:2617.2; NG/GT:20; IV Piggyback:625] Out: 6569 [Emesis/NG output:75; Stool:230; Chest Tube:45] Intake/Output this shift: Total I/O In: 212.9 [I.V.:183.2; IV Piggyback:29.7] Out: 579.4 [Stool:35; Chest Tube:4]  General appearance: ill appearing Neurologic: unresponsive Abdomen: distended Wound: intact  Lab Results: Recent Labs    06/03/23 1606 06/03/23 1618 06/04/23 0413 06/04/23 0843  WBC 43.3*  --  41.8*  --   HGB 8.3*   < > 8.6*  9.9* 9.2*  HCT 25.2*   < > 26.1*  29.0* 27.0*  PLT 31*  --  25*  --    < > = values in this interval not displayed.   BMET:  Recent Labs    06/03/23 1606 06/03/23 1618 06/04/23 0413 06/04/23 0843  NA 136   < > 135  136 138  K 4.6   < > 4.4  4.3 4.3  CL 102  --  101  --   CO2 25  --  23  --   GLUCOSE 215*  --  196*  --   BUN 29*  --  29*  --   CREATININE 1.05  --  1.06  --   CALCIUM 7.9*  --  8.2*  --    < > = values in this interval not displayed.    PT/INR:  Recent Labs    06/04/23 0413  LABPROT 16.2*  INR 1.3*   ABG    Component Value Date/Time   PHART 7.359 06/04/2023 0843   HCO3 24.6 06/04/2023 0843   TCO2 26 06/04/2023 0843    ACIDBASEDEF 1.0 06/04/2023 0843   O2SAT 99 06/04/2023 0843   CBG (last 3)  Recent Labs    06/04/23 0010 06/04/23 0409 06/04/23 0840  GLUCAP 223* 198* 185*    Assessment/Plan: S/P Procedure(s) (LRB): CONVERSION TO VV ECMO (EXTRACORPOREAL MEMBRANE OXYGENATION) (N/A) DECANNULATION FOR ECMO (N/A) TRANSESOPHAGEAL ECHOCARDIOGRAM (N/A) Remains critically ill NEURO- not waking up yet, off sedation CV- junctional at 65  On Epi @ 2 RESP- respiratory failure requiring VV ECMO  Pneumonia, growing mold from cultures  CXR unchanged ID- on Vanco, meropenem, micafungin  WBC 42K RENAL- on CVVHD Nutrition- on TPN D/w AHF and CCM  LOS: 17 days    Loreli Slot 06/04/2023

## 2023-06-04 NOTE — Progress Notes (Signed)
Patient ID: [name], male   DOB: March 14, 1961, 62 y.o.   MRN: [id number]     Advanced Heart Failure Rounding Note  PCP-Cardiologist: Kristeen Miss, MD   Subjective:    9/13: OR for Bentall/AVR, TV repair, re-implantation of SVG-RCA.  Post-op vasoplegia and pulmonary edema, required central VA ECMO (RA drainage, ascending aorta return.  Post-op TEE EF 55%, mild-moderate RV hypokinesis.  9/14: Blender added to circuit due to high PaO2 9/16: TEE with EF 30%, moderate LVH, moderate RV dysfunction with mild enlargement, stable bioprosthetic aortic valve, repaired TV with mild TR, no endocarditis noted.  9/18: Bronchoscopy with mucus plugs and clots. 9/19: To OR for mediastinal washout.  By TEE, EF 50% with moderate LVH, mild RV enlargement and mild RV dysfunction, stable bioprosthetic aortic valve, repaired TV with mild TR.  He did not tolerate lowering of ECMO flows even with increasing of pressors due to vasoplegia.  9/21: Bronchoscopy  BAL: growing candida and dold 9/24: Decannulated from [facility] ECMO. Chest closed. Transitioned to VV ECMO  On VV ECMO. Circuit stable.   Remains intubated; off sedation now >48 with no neurologic response on my exam. Continues to trigger the ventilator. On epinephrine . CVP 10 after 3.2L volume removal.   Objective:   Weight Range: 80.5 kg Body mass index is 28.64 kg/m.   Vital Signs:   Temp:  [97.9 F (36.6 C)-98.2 F (36.8 C)] 98.2 F (36.8 C) (09/28 0400) Pulse Rate:  [60-73] 60 (09/28 0900) Resp:  [0-35] 31 (09/28 0900) SpO2:  [98 %-100 %] 100 % (09/28 0900) Arterial Line BP: (100-141)/(46-74) 115/52 (09/28 0900) FiO2 (%):  [40 %-50 %] 40 % (09/28 0829) Weight:  [80.5 kg] 80.5 kg (09/28 0500) Last BM Date : 06/03/23  Weight change: Filed Weights   06/02/23 0500 06/03/23 0630 06/04/23 0500  Weight: 86.3 kg 82.6 kg 80.5 kg    Intake/Output:   Intake/Output Summary (Last 24 hours) at 06/04/2023 0926 Last data filed at 06/04/2023  0900 Gross per 24 hour  Intake 3209.77 ml  Output 6842.4 ml  Net -3632.63 ml      Physical Exam    General:  Critically-ill appearing. On vent HEENT: + ETT Neck:  +VV ECMO cannula + swan Cor: Chest dressing. + CTs   Lungs: minimal lung sounds on the right; very coarse w/ diffuse rales on the left Abdomen: soft, nontender, nondistended. No hepatosplenomegaly. No bruits or masses. Good bowel sounds. Extremities: no cyanosis, clubbing, rash, 1+ edema Neuro: intubated/sedated  Telemetry   Junctional rhythm + PVCs Personally reviewed  Labs    CBC Recent Labs    06/03/23 1606 06/03/23 1618 06/04/23 0413 06/04/23 0843  WBC 43.3*  --  41.8*  --   HGB 8.3*   < > 8.6*  9.9* 9.2*  HCT 25.2*   < > 26.1*  29.0* 27.0*  MCV 90.6  --  90.0  --   PLT 31*  --  25*  --    < > = values in this interval not displayed.   Basic Metabolic Panel Recent Labs    19/14/78 0405 06/03/23 0410 06/03/23 1606 06/03/23 1618 06/04/23 0413 06/04/23 0843  NA 133*   < > 136   < > 135  136 138  K 4.6   < > 4.6   < > 4.4  4.3 4.3  CL 97*  --  102  --  101  --   CO2 27  --  25  --  23  --  GLUCOSE 230*  --  215*  --  196*  --   BUN 24*  --  29*  --  29*  --   CREATININE 0.93  --  1.05  --  1.06  --   CALCIUM 8.1*  --  7.9*  --  8.2*  --   MG 2.2  --   --   --  2.3  --   PHOS 3.2  --  3.2  --  3.4  --    < > = values in this interval not displayed.   Liver Function Tests Recent Labs    06/03/23 0405 06/03/23 1606 06/04/23 0413  AST 111*  --  108*  ALT 13  --  16  ALKPHOS 90  --  90  BILITOT 4.7*  --  4.7*  PROT 6.4*  --  7.5  ALBUMIN 1.9* 1.9* 2.0*  2.0*   BNP: BNP (last 3 results) Recent Labs    05/16/2023 1100  BNP 2,097.0*     Imaging    CT CHEST ABDOMEN PELVIS WO CONTRAST  Result Date: 06/03/2023 CLINICAL DATA:  Pneumonia with possible complication.  Delirium. EXAM: CT CHEST, ABDOMEN AND PELVIS WITHOUT CONTRAST TECHNIQUE: Multidetector CT imaging of the chest,  abdomen and pelvis was performed following the standard protocol without IV contrast. RADIATION DOSE REDUCTION: This exam was performed according to the departmental dose-optimization program which includes automated exposure control, adjustment of the mA and/or kV according to patient size and/or use of iterative reconstruction technique. COMPARISON:  March 20, 2017. FINDINGS: CT CHEST FINDINGS Cardiovascular: Pericardial drain is noted inferior the heart. ECMO device is seen entering right internal jugular vein with tip extending into IVC. Postsurgical changes are seen involving ascending thoracic aorta. No significant pericardial effusion is noted. Mediastinum/Nodes: Endotracheal tube is in grossly good position. Mediastinal drain is noted anteriorly. Feeding tube is seen passing through esophagus into stomach. Thyroid gland is unremarkable. No significantly enlarged adenopathy is noted. Lungs/Pleura: No pneumothorax or definite effusion is noted. Extensive airspace opacities are noted in both lower lobes as well as in right upper lobe, most consistent with pneumonia. Patchy opacities are noted in the left upper lobe concerning for pneumonia. Bilateral chest tubes are noted. Musculoskeletal: Sternotomy wires are noted. No acute osseous abnormality. CT ABDOMEN PELVIS FINDINGS Hepatobiliary: Cholelithiasis. No biliary dilatation. Liver is unremarkable on these unenhanced images. Pancreas: Unremarkable. No pancreatic ductal dilatation or surrounding inflammatory changes. Spleen: Normal in size without focal abnormality. Adrenals/Urinary Tract: Adrenal glands are unremarkable. Kidneys are normal, without renal calculi, focal lesion, or hydronephrosis. Bladder is unremarkable. Stomach/Bowel: Feeding tube tip is seen in proximal stomach. There is no evidence of bowel obstruction. Mild wall thickening of several small bowel loops in left side of abdomen is noted suggesting enteritis. Rectal tube is noted.  Vascular/Lymphatic: Aortic atherosclerosis. No enlarged abdominal or pelvic lymph nodes. Reproductive: Mild prostatic enlargement is noted. Other: No ascites or hernia. Musculoskeletal: No acute or significant osseous findings. IMPRESSION: ECMO device is noted. Postsurgical changes are seen involving ascending thoracic aorta. Bilateral chest tubes are noted as well as mediastinal and pericardial drains. Extensive bilateral pulmonary airspace opacities are noted most consistent with pneumonia, right greater than left. Endotracheal and feeding tubes are in grossly good position. Mild wall thickening is seen in several small bowel loops in left side of abdomen suggesting enteritis. Cholelithiasis. Mild prostatic enlargement. Aortic Atherosclerosis (ICD10-I70.0). Electronically Signed   By: Lupita Raider M.D.   On: 06/03/2023 11:33   CT  HEAD WO CONTRAST ( )  Result Date: 06/03/2023 CLINICAL DATA:  Delirium with pneumonia, complication suspected. EXAM: CT HEAD WITHOUT CONTRAST TECHNIQUE: Contiguous axial images were obtained from the base of the skull through the vertex without intravenous contrast. RADIATION DOSE REDUCTION: This exam was performed according to the departmental dose-optimization program which includes automated exposure control, adjustment of the mA and/or kV according to patient size and/or use of iterative reconstruction technique. COMPARISON:  01/29/2023 FINDINGS: Brain: No evidence of acute infarction, hemorrhage, hydrocephalus, extra-axial collection or mass lesion/mass effect. Small chronic appearing and cortically based infarct at the anterior left frontal lobe. Cerebral volume loss which is generalized and overall mild. Vascular: No hyperdense vessel or unexpected calcification. Skull: Normal. Negative for fracture or focal lesion. Sinuses/Orbits: Bilateral mastoid and middle ear opacification in the setting of intubation. IMPRESSION: No acute finding. Chronic left frontal infarct  affecting cortex. Electronically Signed   By: Tiburcio Pea M.D.   On: 06/03/2023 11:22     Medications:     Scheduled Medications:  atorvastatin  80 mg Per Tube q1800   bisacodyl  10 mg Oral Daily   Or   bisacodyl  10 mg Rectal Daily   Chlorhexidine Gluconate Cloth  6 each Topical Daily   docusate  100 mg Per Tube BID   ezetimibe  10 mg Per Tube Daily   insulin aspart  0-20 Units Subcutaneous Q4H   insulin detemir  12 Units Subcutaneous BID   metoCLOPramide (REGLAN) injection  5 mg Intravenous Q6H   mouth rinse  15 mL Mouth Rinse Q2H   pantoprazole (PROTONIX) IV  40 mg Intravenous QHS   sodium chloride flush  3 mL Intravenous Q12H    Infusions:   prismasol BGK 4/2.5 400 mL/hr at 06/03/23 2259    prismasol BGK 4/2.5 400 mL/hr at 06/03/23 1210   sodium chloride Stopped (05/24/23 0032)   sodium chloride 10 mL/hr at 06/04/23 0900   albumin human Stopped (05/30/23 0819)   anticoagulant sodium citrate     bivalirudin (ANGIOMAX) 250 mg in sodium chloride 0.9 % 500 mL (0.5 mg/mL) infusion 0.004 mg/kg/hr (06/04/23 0900)   dexmedetomidine (PRECEDEX) IV infusion Stopped (06/03/23 1111)   epinephrine 2 mcg/min (06/04/23 0900)   HYDROmorphone Stopped (06/02/23 0247)   meropenem (MERREM) IV Stopped (06/04/23 2952)   micafungin (MYCAMINE) 200 mg in sodium chloride 0.9 % 100 mL IVPB 110 mL/hr at 06/04/23 0900   midazolam Stopped (06/02/23 0247)   norepinephrine (LEVOPHED) Adult infusion Stopped (06/01/23 1405)   prismasol BGK 4/2.5 1,500 mL/hr at 06/04/23 0747   TPN ADULT (ION) 75 mL/hr at 06/04/23 0900   TPN ADULT (ION)     vancomycin Stopped (06/03/23 2358)   vasopressin Stopped (06/04/23 0127)    PRN Medications: sodium chloride, sodium chloride, albumin human, anticoagulant sodium citrate, artificial tears, dextrose, HYDROmorphone, midazolam, midazolam, morphine injection, ondansetron (ZOFRAN) IV, mouth rinse, oxidized cellulose, oxyCODONE, sodium chloride flush,  traMADol    Assessment/Plan   1. Post-cardiotomy vasoplegia/shock with failure to wean from CPB: Now on central VA ECMO (05/11/2023) with RA drainage/ascending aorta return. Post-op echo with EF 55%, mild-moderate RV dysfunction. Unable to get images yesterday by TTE.  TEE on 9/16 showed EF 30%, moderate LVH, moderate RV dysfunction with mild enlargement, stable bioprosthetic aortic valve, repaired TV with mild TR, no endocarditis noted.  To OR for mediastinal washout on 06/16/23.  By TEE 16-Jun-2023, EF 50% with moderate LVH, mild RV enlargement and mild RV dysfunction, stable bioprosthetic aortic valve, repaired TV  with mild TR.  He did not tolerate lowering of ECMO flows even with increasing of pressors due to vasoplegia. CXR with clearing left lung, right lung with persistent airspace disease not clearing with CVVH. Switch from [place] ECMO to VV ECMO with chest closure on 05/10/2023 - Epi weaned to . Continue to wean. Continue VP 0.04.  - Sedation now on hold. - CVP 10-11 today; continue to pull on CVVHD ~100cc/hr to maintain net negative; goal CVP 7.  - LDH stable. Continue bival. Discussed dosing with PharmD personally. 2. Acute hypoxic respiratory failure, post-op: On vent, rest settings. Blender added to keep PaO2 down. Sweep remains 4.5. CXR with some clearing of left lung, right lung with ongoing diffuse airspace disease without change.  Suspect extensive PNA, think we have done what we can with CVVH for lung clearing at this point. Bronchoscopy yesterday with no change in CXR.  - Now on VV ECMO  - R lung consolidation is severe. BAL from 9/20 + candida and mold - Meropenem/vancomycin/micafungin for coverage of PNA - CT chest with extensive infiltrates. - Will need trach.  - D/w CCM 3. Severe prosthetic AI: Due to previous MSSA endocarditis with partial valve dehiscence s/p re-do AVR/Bentall with reimplantation of SVG-RCA and TV repair 06-14-2023.   - Continuing meropenem/vancomycin/micafungin. 4. CAD s/p  CABG/AVR 2017:  s/p AVR/Bentall and re-implantation of SVG-RCA 06-14-23. - Off ASA with platelets down.  5. ESRD: CVVH as above. Keep negative 6. DM2: management per CCM 7. Heart block: Post-op, now in stable junctional rhythm.   8. H/o CVA: Prior cerebellar CVA.  9. Thrombocytopenia: Post-op, 43 => 38 => 28 ->-> -> 23K -> 26K  -> 44K (got PLTs yesterday).  Suspect inflammatory and hemolysis.  10. Acute blood loss anemia: Post-op, transfuse hgb < 8.  11. ID: H/o MSSA bacteremia.  WBCs 25K.  - Meropenem + vancomycin - Micafungin for fungal coverage added 9/20 - Discussing 2nd line fungal coverage with CCM today; ?amphotericin.  12. FEN: Receiving TFs 13. Liver failure, post-op  CRITICAL CARE Performed by: Dorthula Nettles   Total critical care time: 40 minutes  Critical care time was exclusive of separately billable procedures and treating other patients.  Critical care was necessary to treat or prevent imminent or life-threatening deterioration.  Critical care was time spent personally by me on the following activities: development of treatment plan with patient and/or surrogate as well as nursing, discussions with consultants, evaluation of patient's response to treatment, examination of patient, obtaining history from patient or surrogate, ordering and performing treatments and interventions, ordering and review of laboratory studies, ordering and review of radiographic studies, pulse oximetry and re-evaluation of patient's condition.   Length of Stay: [address] DO  06/04/2023, 9:26 AM  Advanced Heart Failure Team Pager [phone] (M-F; 7a - 5p)  Please contact [facility] for night-coverage after hours (5p -7a ) and weekends on [email or web address]

## 2023-06-04 NOTE — Progress Notes (Signed)
Daughter Danford Bad updated via phone. All questions answered.   Steffanie Dunn, DO 06/04/23 2:43 PM [facility]  For contact information, see Amion. If no response to pager, please call PCCM consult pager. After hours, 7PM- 7AM, please call Elink.

## 2023-06-04 NOTE — Progress Notes (Addendum)
Subjective: Intubated on ECMO   Antibiotics:  Anti-infectives (From admission, onward)    Start     Dose/Rate Route Frequency Ordered Stop   05/27/23 0830  micafungin (MYCAMINE) 200 mg in sodium chloride 0.9 % 100 mL IVPB        200 mg 110 mL/hr over 1 Hours Intravenous Every 24 hours 05/27/23 0732     05/22/23 1830  meropenem (MERREM) 1 g in sodium chloride 0.9 % 100 mL IVPB        1 g 200 mL/hr over 30 Minutes Intravenous Every 8 hours 05/22/23 1740     05/21/23 0100  vancomycin (VANCOCIN) IVPB 1000 mg/200 mL premix  Status:  Discontinued        1,000 mg 200 mL/hr over 60 Minutes Intravenous  Once 05/21/2023 2136 05/27/2023 2142   05/18/2023 2230  ceFAZolin (ANCEF) IVPB 2g/100 mL premix  Status:  Discontinued        2 g 200 mL/hr over 30 Minutes Intravenous Every 8 hours 06/06/2023 2136 05/24/2023 2142   05/19/2023 0400  vancomycin (VANCOREADY) IVPB 1500 mg/300 mL  Status:  Discontinued        1,500 mg 150 mL/hr over 120 Minutes Intravenous To Surgery 05/19/23 1407 05/22/2023 2108   06/03/2023 0400  ceFAZolin (ANCEF) IVPB 2g/100 mL premix        2 g 200 mL/hr over 30 Minutes Intravenous To Surgery 05/19/23 1407 05/23/2023 1421   05/15/2023 0400  ceFAZolin (ANCEF) IVPB 2g/100 mL premix  Status:  Discontinued        2 g 200 mL/hr over 30 Minutes Intravenous To Surgery 05/19/23 1407 05/27/2023 2108   05/19/23 2200  vancomycin (VANCOCIN) IVPB 1000 mg/200 mL premix        1,000 mg 200 mL/hr over 60 Minutes Intravenous Every 24 hours 05/18/23 2038     05/18/23 2200  vancomycin (VANCOREADY) IVPB 2000 mg/400 mL        2,000 mg 200 mL/hr over 120 Minutes Intravenous  Once 05/18/23 2038 05/18/23 2350   05/18/23 2000  rifampin (RIFADIN) capsule 150 mg  Status:  Discontinued        150 mg Oral Daily 05/18/23 1900 05/18/23 1903   05/18/23 2000  cefTRIAXone (ROCEPHIN) 2 g in sodium chloride 0.9 % 100 mL IVPB  Status:  Discontinued        2 g 200 mL/hr over 30 Minutes Intravenous Every 24 hours  05/18/23 1902 05/22/23 2153       Medications: Scheduled Meds:  artificial tears   Both Eyes Q8H   atorvastatin  80 mg Per Tube q1800   bisacodyl  10 mg Oral Daily   Or   bisacodyl  10 mg Rectal Daily   Chlorhexidine Gluconate Cloth  6 each Topical Daily   docusate  100 mg Per Tube BID   ezetimibe  10 mg Per Tube Daily   insulin aspart  0-20 Units Subcutaneous Q4H   insulin detemir  12 Units Subcutaneous BID   metoCLOPramide (REGLAN) injection  5 mg Intravenous Q6H   mouth rinse  15 mL Mouth Rinse Q2H   pantoprazole (PROTONIX) IV  40 mg Intravenous QHS   sodium chloride flush  3 mL Intravenous Q12H   Continuous Infusions:   prismasol BGK 4/2.5 400 mL/hr at 06/03/23 2259    prismasol BGK 4/2.5 400 mL/hr at 06/04/23 1059   sodium chloride Stopped (05/24/23 0032)   sodium chloride 10 mL/hr at 06/04/23 1100  albumin human Stopped (05/30/23 0819)   anticoagulant sodium citrate     bivalirudin (ANGIOMAX) 250 mg in sodium chloride 0.9 % 500 mL (0.5 mg/mL) infusion 0.004 mg/kg/hr (06/04/23 1100)   dexmedetomidine (PRECEDEX) IV infusion Stopped (06/03/23 1111)   epinephrine 4 mcg/min (06/04/23 1100)   HYDROmorphone Stopped (06/02/23 0247)   meropenem (MERREM) IV Stopped (06/04/23 0102)   micafungin (MYCAMINE) 200 mg in sodium chloride 0.9 % 100 mL IVPB Stopped (06/04/23 0946)   midazolam Stopped (06/02/23 0247)   norepinephrine (LEVOPHED) Adult infusion Stopped (06/01/23 1405)   prismasol BGK 4/2.5 1,500 mL/hr at 06/04/23 1057   TPN ADULT (ION) 75 mL/hr at 06/04/23 1100   TPN ADULT (ION)     vancomycin Stopped (06/03/23 2358)   vasopressin Stopped (06/04/23 0127)   PRN Meds:.sodium chloride, sodium chloride, albumin human, anticoagulant sodium citrate, artificial tears, dextrose, HYDROmorphone, midazolam, midazolam, morphine injection, ondansetron (ZOFRAN) IV, mouth rinse, oxidized cellulose, oxyCODONE, sodium chloride flush, traMADol    Objective: Weight change: -2.1  kg  Intake/Output Summary (Last 24 hours) at 06/04/2023 1103 Last data filed at 06/04/2023 1100 Gross per 24 hour  Intake 3227.99 ml  Output 7045.4 ml  Net -3817.41 ml   Blood pressure 120/66, pulse 61, temperature 98.2 F (36.8 C), temperature source Core, resp. rate (!) 29, height 5\' 6"  (1.676 m), weight 80.5 kg, SpO2 100%. Temp:  [97.9 F (36.6 C)-98.2 F (36.8 C)] 98.2 F (36.8 C) (09/28 0400) Pulse Rate:  [60-73] 61 (09/28 1100) Resp:  [0-35] 29 (09/28 1100) SpO2:  [98 %-100 %] 100 % (09/28 1100) Arterial Line BP: (100-141)/(46-74) 119/54 (09/28 1100) FiO2 (%):  [40 %-50 %] 40 % (09/28 0829) Weight:  [80.5 kg] 80.5 kg (09/28 0500)  Physical Exam: Physical Exam Constitutional:      Appearance: He is ill-appearing.     Interventions: He is intubated.  HENT:     Head: Normocephalic and atraumatic.  Cardiovascular:     Rate and Rhythm: Tachycardia present.     Heart sounds: No murmur heard.    No friction rub. No gallop.  Pulmonary:     Effort: He is intubated.  Abdominal:     General: There is no distension.  Skin:    General: Skin is warm.  Neurological:     General: No focal deficit present.      CBC:    BMET Recent Labs    06/03/23 1606 06/03/23 1618 06/04/23 0413 06/04/23 0843  NA 136   < > 135  136 138  K 4.6   < > 4.4  4.3 4.3  CL 102  --  101  --   CO2 25  --  23  --   GLUCOSE 215*  --  196*  --   BUN 29*  --  29*  --   CREATININE 1.05  --  1.06  --   CALCIUM 7.9*  --  8.2*  --    < > = values in this interval not displayed.     Liver Panel  Recent Labs    06/03/23 0405 06/03/23 1606 06/04/23 0413  PROT 6.4*  --  7.5  ALBUMIN 1.9* 1.9* 2.0*  2.0*  AST 111*  --  108*  ALT 13  --  16  ALKPHOS 90  --  90  BILITOT 4.7*  --  4.7*  BILIDIR 2.6*  --  2.8*  IBILI 2.1*  --  1.9*       Sedimentation Rate No results for input(s): "  ESRSEDRATE" in the last 72 hours. C-Reactive Protein No results for input(s): "CRP" in the last 72  hours.  Micro Results: Recent Results (from the past 720 hour(s))  Culture, blood (Routine X 2) w Reflex to ID Panel     Status: None   Collection Time: 05/18/23  8:23 PM   Specimen: BLOOD RIGHT HAND  Result Value Ref Range Status   Specimen Description BLOOD RIGHT HAND  Final   Special Requests   Final    BOTTLES DRAWN AEROBIC AND ANAEROBIC Blood Culture results may not be optimal due to an excessive volume of blood received in culture bottles   Culture   Final    NO GROWTH 5 DAYS Performed at [facility], [address], [place], [place] [address]    Report Status 05/23/2023 FINAL  Final  Culture, blood (Routine X 2) w Reflex to ID Panel     Status: None   Collection Time: 05/18/23  8:27 PM   Specimen: BLOOD RIGHT HAND  Result Value Ref Range Status   Specimen Description BLOOD RIGHT HAND  Final   Special Requests   Final    BOTTLES DRAWN AEROBIC AND ANAEROBIC Blood Culture results may not be optimal due to an excessive volume of blood received in culture bottles   Culture   Final    NO GROWTH 5 DAYS Performed at [facility], [address], [place], [place] [address]    Report Status 05/23/2023 FINAL  Final  SARS Coronavirus 2 by RT PCR (hospital order, performed in [facility] hospital lab) *cepheid single result test* Path Tissue     Status: None   Collection Time: 05/19/23  4:24 AM   Specimen: Path Tissue; Nasal Swab  Result Value Ref Range Status   SARS Coronavirus 2 by RT PCR NEGATIVE NEGATIVE Final    Comment: Performed at [facility], [address], [place], [place] [address]  Surgical PCR screen     Status: None   Collection Time: 05/19/23  4:25 AM   Specimen: Nasal Mucosa; Nasal Swab  Result Value Ref Range Status   MRSA, PCR NEGATIVE NEGATIVE Final   Staphylococcus aureus NEGATIVE NEGATIVE Final    Comment: (NOTE) The Xpert SA Assay (FDA approved for NASAL specimens in patients 60 years of age and older), is one component of a  comprehensive surveillance program. It is not intended to diagnose infection nor to guide or monitor treatment. Performed at [facility], [address] [place], [place] [address]   Aerobic/Anaerobic Culture w Gram Stain (surgical/deep wound)     Status: None   Collection Time: 06/03/2023 11:27 AM   Specimen: Path Tissue  Result Value Ref Range Status   Specimen Description TISSUE  Final   Special Requests AORTIC VALVE  Final   Gram Stain NO WBC SEEN NO ORGANISMS SEEN   Final   Culture   Final    No growth aerobically or anaerobically. Performed at [facility], [address], [place], [place] [address]    Report Status 05/25/2023 FINAL  Final  Aerobic/Anaerobic Culture w Gram Stain (surgical/deep wound)     Status: None   Collection Time: 06/06/2023 12:08 PM   Specimen: Path Tissue  Result Value Ref Range Status   Specimen Description TISSUE  Final   Special Requests AORTIC VALVE LEAFLETS  Final   Gram Stain NO WBC SEEN NO ORGANISMS SEEN   Final   Culture   Final  No growth aerobically or anaerobically. Performed at [facility], [address], [place], [place] [address]    Report Status 05/25/2023 FINAL  Final  Culture, Respiratory w Gram Stain     Status: Abnormal   Collection Time: 05/27/23  8:37 AM   Specimen: Bronchoalveolar Lavage; Respiratory  Result Value Ref Range Status   Specimen Description BRONCHIAL ALVEOLAR LAVAGE  Final   Special Requests NONE  Final   Gram Stain   Final    ABUNDANT WBC PRESENT, PREDOMINANTLY PMN NO ORGANISMS SEEN Performed at [facility], [address] [place], [place] [address]    Culture (A)  Final    FUNGUS (MOLD) ISOLATED, PROBABLE CONTAMINANT/COLONIZER (SAPROPHYTE). CONTACT MICROBIOLOGY IF FURTHER IDENTIFICATION REQUIRED [phone].   Report Status 05/30/2023 FINAL  Final  Culture, fungus without smear     Status: Abnormal (Preliminary result)   Collection Time: 05/27/23  8:38 AM   Specimen:  Bronchoalveolar Lavage; Other  Result Value Ref Range Status   Specimen Description BRONCHIAL ALVEOLAR LAVAGE  Final   Special Requests NONE  Final   Culture (A)  Final    CANDIDA ALBICANS MOLD CONTINUING TO HOLD Performed at [facility], [address] [place], [place] [address]    Report Status PENDING  Incomplete  Fungus Culture With Stain     Status: Abnormal (Preliminary result)   Collection Time: 05/27/23  8:38 AM   Specimen: Bronchial Alveolar Lavage; Respiratory  Result Value Ref Range Status   Fungus Stain Final report (A)  Final    Comment: (NOTE) Performed At: [facility] [address] [place], [place] [id number] Jolene Schimke MD KV:[id number]    Fungus (Mycology) Culture PENDING  Incomplete   Fungal Source BRONCHIAL ALVEOLAR LAVAGE  Final    Comment: Performed at [facility], [address], [place], [place] [address]  Fungus Culture Result     Status: Abnormal   Collection Time: 05/27/23  8:38 AM  Result Value Ref Range Status   Result 1 Comment (A)  Final    Comment: (NOTE) Fungal elements, such as arthroconidia, hyphal fragments, chlamydoconidia, observed. Performed At: [facility] [address] [place], [place] [id number] Jolene Schimke MD AY:[id number]   Culture, Respiratory w Gram Stain     Status: None   Collection Time: 06/01/23  9:44 AM   Specimen: Bronchoalveolar Lavage; Respiratory  Result Value Ref Range Status   Specimen Description BRONCHIAL ALVEOLAR LAVAGE  Final   Special Requests NONE  Final   Gram Stain   Final    ABUNDANT WBC PRESENT, PREDOMINANTLY PMN NO ORGANISMS SEEN Performed at [facility], [address] [place], [place] [address]    Culture   Final    FEW FUNGUS (MOLD) ISOLATED, PROBABLE CONTAMINANT/COLONIZER (SAPROPHYTE). CONTACT MICROBIOLOGY IF FURTHER IDENTIFICATION REQUIRED [phone].   Report Status 06/03/2023 FINAL  Final    Studies/Results: DG CHEST PORT 1 VIEW  Result  Date: 06/04/2023 CLINICAL DATA:  Adult respiratory distress syndrome. EXAM: PORTABLE CHEST 1 VIEW COMPARISON:  Chest radiograph and chest CT dated 06/03/2023. FINDINGS: An endotracheal tube terminates in the midthoracic trachea. An enteric tube reaches the distal esophagus and likely enters the stomach. Multiple mediastinal and thoracostomy tubes appear unchanged. An ECMO catheter appears unchanged. A right internal jugular central venous catheter appears unchanged. Moderate to severe right and moderate left airspace opacities have improved since prior exam. No significant pneumothorax. IMPRESSION: Improved bilateral airspace opacities. Electronically Signed   By: Joselyn Glassman  Litton M.D.   On: 06/04/2023 10:28   CT CHEST ABDOMEN PELVIS WO CONTRAST  Result Date: 06/03/2023 CLINICAL DATA:  Pneumonia with possible complication.  Delirium. EXAM: CT CHEST, ABDOMEN AND PELVIS WITHOUT CONTRAST TECHNIQUE: Multidetector CT imaging of the chest, abdomen and pelvis was performed following the standard protocol without IV contrast. RADIATION DOSE REDUCTION: This exam was performed according to the departmental dose-optimization program which includes automated exposure control, adjustment of the mA and/or kV according to patient size and/or use of iterative reconstruction technique. COMPARISON:  March 20, 2017. FINDINGS: CT CHEST FINDINGS Cardiovascular: Pericardial drain is noted inferior the heart. ECMO device is seen entering right internal jugular vein with tip extending into IVC. Postsurgical changes are seen involving ascending thoracic aorta. No significant pericardial effusion is noted. Mediastinum/Nodes: Endotracheal tube is in grossly good position. Mediastinal drain is noted anteriorly. Feeding tube is seen passing through esophagus into stomach. Thyroid gland is unremarkable. No significantly enlarged adenopathy is noted. Lungs/Pleura: No pneumothorax or definite effusion is noted. Extensive airspace opacities are  noted in both lower lobes as well as in right upper lobe, most consistent with pneumonia. Patchy opacities are noted in the left upper lobe concerning for pneumonia. Bilateral chest tubes are noted. Musculoskeletal: Sternotomy wires are noted. No acute osseous abnormality. CT ABDOMEN PELVIS FINDINGS Hepatobiliary: Cholelithiasis. No biliary dilatation. Liver is unremarkable on these unenhanced images. Pancreas: Unremarkable. No pancreatic ductal dilatation or surrounding inflammatory changes. Spleen: Normal in size without focal abnormality. Adrenals/Urinary Tract: Adrenal glands are unremarkable. Kidneys are normal, without renal calculi, focal lesion, or hydronephrosis. Bladder is unremarkable. Stomach/Bowel: Feeding tube tip is seen in proximal stomach. There is no evidence of bowel obstruction. Mild wall thickening of several small bowel loops in left side of abdomen is noted suggesting enteritis. Rectal tube is noted. Vascular/Lymphatic: Aortic atherosclerosis. No enlarged abdominal or pelvic lymph nodes. Reproductive: Mild prostatic enlargement is noted. Other: No ascites or hernia. Musculoskeletal: No acute or significant osseous findings. IMPRESSION: ECMO device is noted. Postsurgical changes are seen involving ascending thoracic aorta. Bilateral chest tubes are noted as well as mediastinal and pericardial drains. Extensive bilateral pulmonary airspace opacities are noted most consistent with pneumonia, right greater than left. Endotracheal and feeding tubes are in grossly good position. Mild wall thickening is seen in several small bowel loops in left side of abdomen suggesting enteritis. Cholelithiasis. Mild prostatic enlargement. Aortic Atherosclerosis (ICD10-I70.0). Electronically Signed   By: Lupita Raider M.D.   On: 06/03/2023 11:33   CT HEAD WO CONTRAST ( )  Result Date: 06/03/2023 CLINICAL DATA:  Delirium with pneumonia, complication suspected. EXAM: CT HEAD WITHOUT CONTRAST TECHNIQUE:  Contiguous axial images were obtained from the base of the skull through the vertex without intravenous contrast. RADIATION DOSE REDUCTION: This exam was performed according to the departmental dose-optimization program which includes automated exposure control, adjustment of the mA and/or kV according to patient size and/or use of iterative reconstruction technique. COMPARISON:  01/29/2023 FINDINGS: Brain: No evidence of acute infarction, hemorrhage, hydrocephalus, extra-axial collection or mass lesion/mass effect. Small chronic appearing and cortically based infarct at the anterior left frontal lobe. Cerebral volume loss which is generalized and overall mild. Vascular: No hyperdense vessel or unexpected calcification. Skull: Normal. Negative for fracture or focal lesion. Sinuses/Orbits: Bilateral mastoid and middle ear opacification in the setting of intubation. IMPRESSION: No acute finding. Chronic left frontal infarct affecting cortex. Electronically Signed   By: Tiburcio Pea M.D.   On: 06/03/2023 11:22   DG CHEST PORT 1  VIEW  Result Date: 06/03/2023 CLINICAL DATA:  ARDS.  ECMO. EXAM: PORTABLE CHEST 1 VIEW COMPARISON:  Chest radiograph yesterday FINDINGS: Endotracheal tube, enteric tube, right internal jugular dialysis catheter, left internal jugular sheath, bilateral chest tubes, mediastinal drains, and right ECMO catheter are unchanged in positioning. Median sternotomy. Heterogeneous bilateral lung opacities, right greater than left, without significant interval change. No pneumothorax or large pleural effusion. IMPRESSION: 1. No significant interval change. Heterogeneous bilateral lung opacities, right greater than left, without significant interval change. 2. Support apparatus unchanged in positioning. Electronically Signed   By: Narda Rutherford M.D.   On: 06/03/2023 09:50   DG Chest Port 1 View  Result Date: 06/02/2023 CLINICAL DATA:  Central line placement. EXAM: PORTABLE CHEST 1 VIEW  COMPARISON:  06/02/2023 FINDINGS: Endotracheal tube 2.2 cm above the carina. This could be retracted 3 cm. Stable mediastinal and bilateral chest tubes.  No pneumothorax. Stable right jugular catheter with its tip in the superior vena cava just above the superior cavoatrial junction. Left jugular catheter tip in the superior vena cava without significant change. Interval left subclavian catheter with its tip in the proximal superior vena cava. Stable post CABG changes. Stable enlarged cardiac silhouette. No significant overall change in airspace opacity in both lungs, including dense opacity in the right lung. IMPRESSION: 1. Endotracheal tube 2.2 cm above the carina. This could be retracted 3 cm. 2. Interval left subclavian catheter with its tip in the proximal superior vena cava. 3. No pneumothorax. 4. No significant change in bilateral pneumonia and/or pulmonary edema. Electronically Signed   By: Beckie Salts M.D.   On: 06/02/2023 16:08      Assessment/Plan:  INTERVAL HISTORY:  saprophyte growing in 2 cultures, and fungal culture seems to be growing an aspergillus which is UNDOUBTEDLY at most a colonizer rather than representative of invasive fungal infection (see commentary below)   Principal Problem:   Cardiogenic shock (HCC) Active Problems:   CAD (coronary artery disease)   Type 2 diabetes mellitus without complication, without long-term current use of insulin (HCC)   OSA on CPAP   Symptomatic anemia   Acute on chronic congestive heart failure (HCC)   Prosthetic aortic valve failure   Acute respiratory failure with hypoxia (HCC)   Anemia   Protein-calorie malnutrition, severe    [name] is a 62 y.o. male with recent history of MSSA bacteremia and prosthetic tricuspid valve endocarditis and CVA with suspected septic emboli s/p treatment with oxacillin and rifampin presenting with chest pain and shortness of breath and found to have aortic valve dehiscence and now s/p aortic and  tricuspid valve replacement. Course has been complicated by need for ECMO and inability to come off by-pass   He has grown prosthetic molds from 2 BAL cultures and on the fungal culture appears to be growing and Aspergillus.  Also grew a Candida species   CT surgery have added cresemba now   #1 MOLDS and candida in lung cultures:  I DO NOT KNOW WHY WE ARE TREATING this patient as if he has an invasive mold infection of the lungs.  He is NOT a LUNG transplant patient has not been on chemotherapy and to my knowledge has not been on prolonged corticosteroid therapy  Similarly candida is not a pathogen in the lungs with exception of lung transplant world where even there it is rare  Absent some missing piece of the story that would put him in one oft he above categories I would DC all of his  antifungal therapy  #2 ARDS and leukocytosis on ECMO  To best I can determine these are the reasons for his more than TWO weeks of carbapenem therapy and vancomycin therapy  We have zero target for these antibiotics and if there were a target it would be dead by now  Additionally they appear to be doing nothing to improve either his WBC or his ARDS  Instead we are ensuring that the patient will get CRE which will be a MUCH bigger and more expensive problem to deal with if and when it emerges   Similarly VRE will be in his future as well    I WOULD DCING these broad spectrum antibiotics and  having him on cefazolin for his heart valve  #3 Endocarditis: would switch back to cefazolin  #4 Renal failure on CRRT  I have personally spent 50 minutes involved in face-to-face and non-face-to-face activities for this patient on the day of the visit. Professional time spent includes the following activities: Preparing to see the patient (review of tests), Obtaining and/or reviewing separately obtained history (admission/discharge record), Performing a medically appropriate examination and/or evaluation  , Ordering medications/tests/procedures, referring and communicating with other health care professionals, Documenting clinical information in the EMR, Independently interpreting results (not separately reported), Communicating results to the patient/family/caregiver, Counseling and educating the patient/family/caregiver and Care coordination (not separately reported).     I will reach out to primary team and have sent secure chat to Dr. Dorris Fetch and Dr. Chestine Spore.   LOS: 17 days   Acey Lav 06/04/2023, 11:03 AM

## 2023-06-04 NOTE — Progress Notes (Addendum)
Pharmacy Antibiotic Note  [name] is a 62 y.o. male admitted on 05/08/2023 with  escalation in fungal CNS/PNA coverage .  Pharmacy has been consulted for amphotericin B dosing.  BAL cx continue to grow few fungus (mold) - BAL cx from 9/20 growing candida albicans. Was started on micafungin on 9/20. Discussion with HF/CCM/CTVS on rounds - would like escalation in anti-fungal coverage. Voriconazole avoid given ECMO circuit given variable PK effects in studies. Isavuconazole is currently unavailable/not stocked. Posaconazole avoided given baseline prolonged Qtc, variable CNS penetration, and slightly elevated LFTs. CT head with no acute finding although completed without contrast. CT chest showing extensive bilateral pulmonary opacities consistent with PNA. WBC 41.8, afebrile (of note on CRRT). LA 1.8.   Will order 250 mL fluids to be ran before and after amphotericin B. K 4.4, Mag 2.3 - will monitor closely while on therapy given on concurrent CRRT and TPN.   Plan: Amphotericin B liposome 240 mg (3 mg/kg) every 24 hours Monitor electrolytes (K>4, Mag>2), signs of infusion reaction, cx results, clinical pic  Height: 5\' 6"  (167.6 cm) Weight: 80.5 kg (177 lb 7.5 oz) IBW/kg (Calculated) : 63.8  Temp (24hrs), Avg:98.1 F (36.7 C), Min:97.9 F (36.6 C), Max:98.2 F (36.8 C)  Recent Labs  Lab 05/30/23 2212 05/29/2023 0418 06/02/23 0403 06/02/23 0405 06/02/23 1553 06/02/23 1608 06/03/23 0405 06/03/23 0415 06/03/23 0606 06/03/23 1606 06/04/23 0412 06/04/23 0413  WBC  --    < > 40.1*  --  45.0*  --  44.4*  --   --  43.3*  --  41.8*  CREATININE  --    < > 0.98  1.03  --  0.92  --  0.93  --   --  1.05  --  1.06  LATICACIDVEN  --    < >  --  2.2*  --  1.9  --  2.0* 1.7  --  1.8  --   VANCOTROUGH 17  --   --   --   --   --   --   --   --   --   --   --    < > = values in this interval not displayed.    Estimated Creatinine Clearance: 71.1 mL/min (by C-G formula based on SCr of 1.06  mg/dL).    Allergies  Allergen Reactions   Other Anaphylaxis    Mushrooms  Not listed on MAR    Plavix [Clopidogrel Bisulfate] Other (See Comments)    TTP Not listed on the [facility]   Fleet Enema [Enema] Other (See Comments)    Unknown reaction   Lovenox [Enoxaparin] Other (See Comments)    Unknown reaction   Morphine Other (See Comments)    Unknown reaction   Nsaids Other (See Comments)    Unknown reaction   Hydrocodone-Acetaminophen Itching    Antimicrobials this admission: Ceftriaxone 9/11>> 9/15 Meropenem 9/15 >> Vanc 9/11>> Micafungin 9/20 >> 9/28 AmphoB 9/28>>  Dose adjustments this admission: N/A  Microbiology results: BAL 9/28: no orgs seen to date, few fungi (probable contaminant/colonizer - micro to identify) BAL 9/20: candida albicans, fungus/mold isolated (probable contaminant/colonizer - micro to identify) Aortic valve 9/13: ngtd Bcx 9/11: ngtd  Thank you for allowing pharmacy to participate in this patient's care,  Sherron Monday, PharmD, BCCCP Clinical Pharmacist  Phone: [phone] 06/04/2023 11:22 AM  Please check AMION for all [facility] Pharmacy phone numbers After 10:00 PM, call Main Pharmacy [phone]

## 2023-06-04 NOTE — Progress Notes (Signed)
      [address]       [place] [address]             [phone]      No new issues  BP 120/66   Pulse 68   Temp (!) 97.2 F (36.2 C)   Resp (!) 34   Ht 5\' 6"  (1.676 m)   Wt 80.5 kg   SpO2 100%   BMI 28.64 kg/m    Intake/Output Summary (Last 24 hours) at 06/04/2023 1825 Last data filed at 06/04/2023 1800 Gross per 24 hour  Intake 3755.63 ml  Output 6694.7 ml  Net -2939.07 ml   Continue current Rx  Sorren Vallier C. Dorris Fetch, MD [facility] [phone]

## 2023-06-04 NOTE — Plan of Care (Signed)
Problem: Activity: Goal: Ability to tolerate increased activity will improve Outcome: Not Progressing   Problem: Respiratory: Goal: Ability to maintain a clear airway and adequate ventilation will improve Outcome: Not Progressing   Problem: Role Relationship: Goal: Method of communication will improve Outcome: Not Progressing   Problem: Education: Goal: Ability to manage disease process will improve Outcome: Not Progressing   Problem: Cardiac: Goal: Ability to achieve and maintain adequate cardiopulmonary perfusion will improve Outcome: Not Progressing   Problem: Neurologic: Goal: Promote progressive neurologic recovery Outcome: Not Progressing   Problem: Skin Integrity: Goal: Risk for impaired skin integrity will be minimized. Outcome: Not Progressing   Problem: Education: Goal: Ability to describe self-care measures that may prevent or decrease complications (Diabetes Survival Skills Education) will improve Outcome: Not Progressing   Problem: Coping: Goal: Ability to adjust to condition or change in health will improve Outcome: Not Progressing   Problem: Fluid Volume: Goal: Ability to maintain a balanced intake and output will improve Outcome: Not Progressing   Problem: Health Behavior/Discharge Planning: Goal: Ability to identify and utilize available resources and services will improve Outcome: Not Progressing Goal: Ability to manage health-related needs will improve Outcome: Not Progressing   Problem: Metabolic: Goal: Ability to maintain appropriate glucose levels will improve Outcome: Not Progressing   Problem: Nutritional: Goal: Maintenance of adequate nutrition will improve Outcome: Not Progressing Goal: Progress toward achieving an optimal weight will improve Outcome: Not Progressing   Problem: Skin Integrity: Goal: Risk for impaired skin integrity will decrease Outcome: Not Progressing   Problem: Tissue Perfusion: Goal: Adequacy of tissue  perfusion will improve Outcome: Not Progressing   Problem: Education: Goal: Knowledge of General Education information will improve Description: Including pain rating scale, medication(s)/side effects and non-pharmacologic comfort measures Outcome: Not Progressing   Problem: Health Behavior/Discharge Planning: Goal: Ability to manage health-related needs will improve Outcome: Not Progressing   Problem: Clinical Measurements: Goal: Ability to maintain clinical measurements within normal limits will improve Outcome: Not Progressing Goal: Will remain free from infection Outcome: Not Progressing Goal: Diagnostic test results will improve Outcome: Not Progressing Goal: Respiratory complications will improve Outcome: Not Progressing Goal: Cardiovascular complication will be avoided Outcome: Not Progressing   Problem: Activity: Goal: Risk for activity intolerance will decrease Outcome: Not Progressing   Problem: Nutrition: Goal: Adequate nutrition will be maintained Outcome: Not Progressing   Problem: Coping: Goal: Level of anxiety will decrease Outcome: Not Progressing   Problem: Elimination: Goal: Will not experience complications related to bowel motility Outcome: Not Progressing Goal: Will not experience complications related to urinary retention Outcome: Not Progressing   Problem: Pain Managment: Goal: General experience of comfort will improve Outcome: Not Progressing   Problem: Safety: Goal: Ability to remain free from injury will improve Outcome: Not Progressing   Problem: Skin Integrity: Goal: Risk for impaired skin integrity will decrease Outcome: Not Progressing   Problem: Education: Goal: Ability to describe self-care measures that may prevent or decrease complications (Diabetes Survival Skills Education) will improve Outcome: Not Progressing   Problem: Coping: Goal: Ability to adjust to condition or change in health will improve Outcome: Not  Progressing   Problem: Fluid Volume: Goal: Ability to maintain a balanced intake and output will improve Outcome: Not Progressing   Problem: Health Behavior/Discharge Planning: Goal: Ability to identify and utilize available resources and services will improve Outcome: Not Progressing Goal: Ability to manage health-related needs will improve Outcome: Not Progressing   Problem: Metabolic: Goal: Ability to maintain appropriate glucose levels will improve Outcome: Not  Progressing   Problem: Nutritional: Goal: Maintenance of adequate nutrition will improve Outcome: Not Progressing Goal: Progress toward achieving an optimal weight will improve Outcome: Not Progressing   Problem: Skin Integrity: Goal: Risk for impaired skin integrity will decrease Outcome: Not Progressing   Problem: Tissue Perfusion: Goal: Adequacy of tissue perfusion will improve Outcome: Not Progressing

## 2023-06-04 NOTE — Progress Notes (Addendum)
At [facility] request, I called [facility] transfer center to speak to transplant ID- not available for tele consults for patients not known to their service.  [facility] called, also not available for tele ID consults for unknown patient. They refer all all unknown calls to a covering hospitalist. Information given for consult, images powershared, facesheet faxed, awaiting a call back.    Steffanie Dunn, DO 06/04/23 11:44 AM [facility]  For contact information, see Amion. If no response to pager, please call PCCM consult pager. After hours, 7PM- 7AM, please call Elink.    Received a call back from ID at [facility]-- reviewed the case over the phone. She indicated that formal recommendations in this case are unable to be made without a complete chart review being completed, but in a patient not improving with prolonged critical illness, it is reasonable to broaden to amphotericin, and they often have to do ampho + voriconazole.   Steffanie Dunn, DO 06/04/23 3:16 PM [facility]  For contact information, see Amion. If no response to pager, please call PCCM consult pager. After hours, 7PM- 7AM, please call Elink.

## 2023-06-04 NOTE — Progress Notes (Signed)
Patient ID: [name], male   DOB: Oct 08, 1960, 62 y.o.   MRN: [id number]   Extracorporeal support note   ECLS support day: 5 Indication: Acute hypoxic respiratory failure  Configuration: Central VV ECMO  30FR Dual-lumen Crescent cannula in RIJ   Pump speed: 3400 rpm  Pump flow: 3.5 L/min Pump used: Cardiohelp  Sweep gas: 6  ABG: 7.39/43/164 LDH: 1150  Circuit check: Small clot at 12p Anticoagulant: On bival Anticoagulation targets:  PTT 50-60  Changes in support: none  Anticipated goals/duration of support: Wean VV ECMO as able; has significant pulmonary infiltrates. Started on amphotericin.   Eliezer Lofts Dannel Rafter DO  06/04/2023, 9:19 AM

## 2023-06-04 NOTE — Progress Notes (Addendum)
INFECTIOUS DISEASE ATTENDING ADDENDUM:   Date: 06/04/2023  Patient name: [name]  Medical record number: [id number]  Date of birth: [birth year]   Spoke with Venetia Constable, MD, PharmD who is transplant ID at [facility]  She also felt this was unlikley to be an invasive mold infection but given protracted ICU stay w pt on ECMO that invasive aspergillus infection was not completely "far fetched"  She is using voriconazole or posaconazole.  Unfortunately the patient has had some QTc prolongation.  We are having QT rechecked if it looks like we can use voriconazole or posaconazole we can start posaconazole with the former being more problematic in the context of CRRT.  If these are not options I would favor using the echinocandin and not giving Amphotericin as I am certain that we will permanently ruin his kidneys.  I would favor this approach and then procuring Cresemba during the week.  She also agreed that the carbapenem therapy needed to be stopped to prevent CRE emergence  Acey Lav 06/04/2023, 12:24 PM

## 2023-06-04 NOTE — Progress Notes (Signed)
Patient ID: [name], male   DOB: 02-27-1961, 62 y.o.   MRN: [id number] S: remains on ecmo. Minimal epi requirements. Ongoing thrombocytopenia. CRRT no issues O:BP 120/66   Pulse 60   Temp 98.2 F (36.8 C) (Core)   Resp (!) 31   Ht 5\' 6"  (1.676 m)   Wt 80.5 kg   SpO2 100%   BMI 28.64 kg/m   Intake/Output Summary (Last 24 hours) at 06/04/2023 0931 Last data filed at 06/04/2023 0900 Gross per 24 hour  Intake 3209.77 ml  Output 6842.4 ml  Net -3632.63 ml   Intake/Output: I/O last 3 completed shifts: In: 5643.6 [I.V.:4128.8; Blood:305; Other:115; NG/GT:60; IV Piggyback:1034.9] Out: 36644 [Emesis/NG output:125; Stool:265; Chest Tube:45]  Intake/Output this shift:  Total I/O In: 212.9 [I.V.:183.2; IV Piggyback:29.7] Out: 579.4 [Stool:35; Chest Tube:4] Weight change: -2.1 kg IHK:[id number] ill-appearing, intubated and sedated FIE:PPIRJJ rate, no audible rub Resp:coarse BS bilaterally, ventilated, bilateral chest rise Abd:+BS, soft Ext: no lee  Recent Labs  Lab 05/29/23 0407 05/29/23 0751 05/30/23 0418 05/30/23 0613 06/04/2023 0418 06/06/2023 0747 06/01/23 0424 06/01/23 0433 06/01/23 1617 06/01/23 1627 06/02/23 0403 06/02/23 0433 06/02/23 1553 06/02/23 1601 06/03/23 0405 06/03/23 0410 06/03/23 1130 06/03/23 1606 06/03/23 1618 06/03/23 2004 06/04/23 0012 06/04/23 0413 06/04/23 0843  NA 134*   < > 134*   < > 135   < > 137   < > 137   < > 136  134*   < > 135   < > 133*   < > 136 136 136 136 135 135  136 138  K 3.7   < > 3.7   < > 3.8   < > 4.2   < > 4.4   < > 4.1  4.1   < > 4.6   < > 4.6   < > 4.4 4.6 4.5 4.4 4.3 4.4  4.3 4.3  CL 99   < > 102   < > 101   < > 102  --  97*  --  96*  96*  --  97*  --  97*  --   --  102  --   --   --  101  --   CO2 21*   < > 22   < > 23   < > 19*  --  25  --  27  32  --  24  --  27  --   --  25  --   --   --  23  --   GLUCOSE 163*   < > 118*   < > 172*   < > 204*  --  206*  --  202*  202*  --  226*  --  230*  --   --  215*  --   --    --  196*  --   BUN 29*   < > 22   < > 14   < > 18  --  20  --  23  23  --  23  --  24*  --   --  29*  --   --   --  29*  --   CREATININE 1.11   < > 1.13   < > 1.06   < > 1.19  --  1.11  --  0.98  1.03  --  0.92  --  0.93  --   --  1.05  --   --   --  1.06  --  ALBUMIN 1.9*  2.0*   < > 1.8*   < > 1.9*  1.9*   < > 2.0*  2.0*  --  2.0*  --  1.8*  1.8*  --  1.8*  --  1.9*  --   --  1.9*  --   --   --  2.0*  2.0*  --   CALCIUM 8.7*   < > 8.3*   < > 8.7*   < > 8.1*  --  8.0*  --  8.0*  7.8*  --  7.7*  --  8.1*  --   --  7.9*  --   --   --  8.2*  --   PHOS 2.1*   < > 2.0*   < > 2.8   < > 3.9  --  2.9  --  2.2*  2.2*  --  4.3  --  3.2  --   --  3.2  --   --   --  3.4  --   AST 71*  --  76*  --  69*  --  88*  --   --   --  112*  --   --   --  111*  --   --   --   --   --   --  108*  --   ALT 8  --  9  --  9  --  12  --   --   --  10  --   --   --  13  --   --   --   --   --   --  16  --    < > = values in this interval not displayed.   Liver Function Tests: Recent Labs  Lab 06/02/23 0403 06/02/23 1553 06/03/23 0405 06/03/23 1606 06/04/23 0413  AST 112*  --  111*  --  108*  ALT 10  --  13  --  16  ALKPHOS 84  --  90  --  90  BILITOT 4.5*  --  4.7*  --  4.7*  PROT 6.0*  --  6.4*  --  7.5  ALBUMIN 1.8*  1.8*   < > 1.9* 1.9* 2.0*  2.0*   < > = values in this interval not displayed.   No results for input(s): "LIPASE", "AMYLASE" in the last 168 hours. Recent Labs  Lab 06/03/23 0742  AMMONIA 26   CBC: Recent Labs  Lab 05/29/23 1722 05/30/23 0002 06/02/23 0403 06/02/23 0433 06/02/23 1553 06/02/23 1601 06/03/23 0405 06/03/23 0410 06/03/23 1606 06/03/23 1618 06/04/23 0012 06/04/23 0413 06/04/23 0843  WBC 49.2*   < > 40.1*  --  45.0*  --  44.4*  --  43.3*  --   --  41.8*  --   NEUTROABS 25.7*  --   --   --   --   --   --   --   --   --   --   --   --   HGB 7.9*   < > 8.7*   < > 8.7*   < > 8.4*   < > 8.3*   < > 9.2* 8.6*  9.9* 9.2*  HCT 24.5*   < > 25.5*   < > 25.8*   <  > 24.9*   < > 25.2*   < > 27.0* 26.1*  29.0* 27.0*  MCV 90.7   < > 86.1  --  88.7  --  88.3  --  90.6  --   --  90.0  --   PLT 17*   < > 26*  --  21*  --  44*  --  31*  --   --  25*  --    < > = values in this interval not displayed.   Cardiac Enzymes: No results for input(s): "CKTOTAL", "CKMB", "CKMBINDEX", "TROPONINI" in the last 168 hours. CBG: Recent Labs  Lab 06/03/23 1615 06/03/23 2003 06/04/23 0010 06/04/23 0409 06/04/23 0840  GLUCAP 212* 216* 223* 198* 185*    Iron Studies: No results for input(s): "IRON", "TIBC", "TRANSFERRIN", "FERRITIN" in the last 72 hours. Studies/Results: CT CHEST ABDOMEN PELVIS WO CONTRAST  Result Date: 06/03/2023 CLINICAL DATA:  Pneumonia with possible complication.  Delirium. EXAM: CT CHEST, ABDOMEN AND PELVIS WITHOUT CONTRAST TECHNIQUE: Multidetector CT imaging of the chest, abdomen and pelvis was performed following the standard protocol without IV contrast. RADIATION DOSE REDUCTION: This exam was performed according to the departmental dose-optimization program which includes automated exposure control, adjustment of the mA and/or kV according to patient size and/or use of iterative reconstruction technique. COMPARISON:  March 20, 2017. FINDINGS: CT CHEST FINDINGS Cardiovascular: Pericardial drain is noted inferior the heart. ECMO device is seen entering right internal jugular vein with tip extending into IVC. Postsurgical changes are seen involving ascending thoracic aorta. No significant pericardial effusion is noted. Mediastinum/Nodes: Endotracheal tube is in grossly good position. Mediastinal drain is noted anteriorly. Feeding tube is seen passing through esophagus into stomach. Thyroid gland is unremarkable. No significantly enlarged adenopathy is noted. Lungs/Pleura: No pneumothorax or definite effusion is noted. Extensive airspace opacities are noted in both lower lobes as well as in right upper lobe, most consistent with pneumonia. Patchy opacities  are noted in the left upper lobe concerning for pneumonia. Bilateral chest tubes are noted. Musculoskeletal: Sternotomy wires are noted. No acute osseous abnormality. CT ABDOMEN PELVIS FINDINGS Hepatobiliary: Cholelithiasis. No biliary dilatation. Liver is unremarkable on these unenhanced images. Pancreas: Unremarkable. No pancreatic ductal dilatation or surrounding inflammatory changes. Spleen: Normal in size without focal abnormality. Adrenals/Urinary Tract: Adrenal glands are unremarkable. Kidneys are normal, without renal calculi, focal lesion, or hydronephrosis. Bladder is unremarkable. Stomach/Bowel: Feeding tube tip is seen in proximal stomach. There is no evidence of bowel obstruction. Mild wall thickening of several small bowel loops in left side of abdomen is noted suggesting enteritis. Rectal tube is noted. Vascular/Lymphatic: Aortic atherosclerosis. No enlarged abdominal or pelvic lymph nodes. Reproductive: Mild prostatic enlargement is noted. Other: No ascites or hernia. Musculoskeletal: No acute or significant osseous findings. IMPRESSION: ECMO device is noted. Postsurgical changes are seen involving ascending thoracic aorta. Bilateral chest tubes are noted as well as mediastinal and pericardial drains. Extensive bilateral pulmonary airspace opacities are noted most consistent with pneumonia, right greater than left. Endotracheal and feeding tubes are in grossly good position. Mild wall thickening is seen in several small bowel loops in left side of abdomen suggesting enteritis. Cholelithiasis. Mild prostatic enlargement. Aortic Atherosclerosis (ICD10-I70.0). Electronically Signed   By: Lupita Raider M.D.   On: 06/03/2023 11:33   CT HEAD WO CONTRAST ( )  Result Date: 06/03/2023 CLINICAL DATA:  Delirium with pneumonia, complication suspected. EXAM: CT HEAD WITHOUT CONTRAST TECHNIQUE: Contiguous axial images were obtained from the base of the skull through the vertex without intravenous contrast.  RADIATION DOSE REDUCTION: This exam was performed according to the departmental dose-optimization program which includes automated exposure control, adjustment of the mA and/or kV according to  patient size and/or use of iterative reconstruction technique. COMPARISON:  01/29/2023 FINDINGS: Brain: No evidence of acute infarction, hemorrhage, hydrocephalus, extra-axial collection or mass lesion/mass effect. Small chronic appearing and cortically based infarct at the anterior left frontal lobe. Cerebral volume loss which is generalized and overall mild. Vascular: No hyperdense vessel or unexpected calcification. Skull: Normal. Negative for fracture or focal lesion. Sinuses/Orbits: Bilateral mastoid and middle ear opacification in the setting of intubation. IMPRESSION: No acute finding. Chronic left frontal infarct affecting cortex. Electronically Signed   By: Tiburcio Pea M.D.   On: 06/03/2023 11:22   DG CHEST PORT 1 VIEW  Result Date: 06/03/2023 CLINICAL DATA:  ARDS.  ECMO. EXAM: PORTABLE CHEST 1 VIEW COMPARISON:  Chest radiograph yesterday FINDINGS: Endotracheal tube, enteric tube, right internal jugular dialysis catheter, left internal jugular sheath, bilateral chest tubes, mediastinal drains, and right ECMO catheter are unchanged in positioning. Median sternotomy. Heterogeneous bilateral lung opacities, right greater than left, without significant interval change. No pneumothorax or large pleural effusion. IMPRESSION: 1. No significant interval change. Heterogeneous bilateral lung opacities, right greater than left, without significant interval change. 2. Support apparatus unchanged in positioning. Electronically Signed   By: Narda Rutherford M.D.   On: 06/03/2023 09:50   DG Chest Port 1 View  Result Date: 06/02/2023 CLINICAL DATA:  Central line placement. EXAM: PORTABLE CHEST 1 VIEW COMPARISON:  06/02/2023 FINDINGS: Endotracheal tube 2.2 cm above the carina. This could be retracted 3 cm. Stable  mediastinal and bilateral chest tubes.  No pneumothorax. Stable right jugular catheter with its tip in the superior vena cava just above the superior cavoatrial junction. Left jugular catheter tip in the superior vena cava without significant change. Interval left subclavian catheter with its tip in the proximal superior vena cava. Stable post CABG changes. Stable enlarged cardiac silhouette. No significant overall change in airspace opacity in both lungs, including dense opacity in the right lung. IMPRESSION: 1. Endotracheal tube 2.2 cm above the carina. This could be retracted 3 cm. 2. Interval left subclavian catheter with its tip in the proximal superior vena cava. 3. No pneumothorax. 4. No significant change in bilateral pneumonia and/or pulmonary edema. Electronically Signed   By: Beckie Salts M.D.   On: 06/02/2023 16:08    atorvastatin  80 mg Per Tube q1800   bisacodyl  10 mg Oral Daily   Or   bisacodyl  10 mg Rectal Daily   Chlorhexidine Gluconate Cloth  6 each Topical Daily   docusate  100 mg Per Tube BID   ezetimibe  10 mg Per Tube Daily   insulin aspart  0-20 Units Subcutaneous Q4H   insulin detemir  12 Units Subcutaneous BID   metoCLOPramide (REGLAN) injection  5 mg Intravenous Q6H   mouth rinse  15 mL Mouth Rinse Q2H   pantoprazole (PROTONIX) IV  40 mg Intravenous QHS   sodium chloride flush  3 mL Intravenous Q12H    BMET    Component Value Date/Time   NA 138 06/04/2023 0843   NA 143 11/02/2022 1454   K 4.3 06/04/2023 0843   CL 101 06/04/2023 0413   CO2 23 06/04/2023 0413   GLUCOSE 196 (H) 06/04/2023 0413   BUN 29 (H) 06/04/2023 0413   BUN 31 (H) 11/02/2022 1454   CREATININE 1.06 06/04/2023 0413   CREATININE 2.71 (H) 05/30/2023 0950   CREATININE 1.05 02/24/2015 1601   CALCIUM 8.2 (L) 06/04/2023 0413   GFRNONAA >60 06/04/2023 0413   GFRNONAA 26 (L) 05/25/2023 6045  GFRNONAA 80 02/24/2015 1601   GFRAA 62 10/09/2020 1520   GFRAA >89 02/24/2015 1601   CBC    Component  Value Date/Time   WBC 41.8 (H) 06/04/2023 0413   RBC 2.90 (L) 06/04/2023 0413   HGB 9.2 (L) 06/04/2023 0843   HGB 6.4 (LL) 05/16/2023 0852   HGB 10.2 (L) 08/12/2022 0934   HCT 27.0 (L) 06/04/2023 0843   HCT 29.2 (L) 08/12/2022 0934   PLT 25 (LL) 06/04/2023 0413   PLT 109 (L) 06/01/2023 0852   PLT 252 08/12/2022 0934   MCV 90.0 06/04/2023 0413   MCV 94 08/12/2022 0934   MCH 29.7 06/04/2023 0413   MCHC 33.0 06/04/2023 0413   RDW 17.6 (H) 06/04/2023 0413   RDW 15.5 (H) 08/12/2022 0934   LYMPHSABS 5.8 (H) 05/29/2023 1722   LYMPHSABS 1.9 11/04/2021 1028   MONOABS 6.3 (H) 05/29/2023 1722   EOSABS 0.0 05/29/2023 1722   EOSABS 0.2 11/04/2021 1028   BASOSABS 0.0 05/29/2023 1722   BASOSABS 0.1 11/04/2021 1028    Assessment/Plan:   Anuric AKI/dialysis dependent AKI with fluid overload: Started on RRT during previous admission in May 2024 until 05/10/23 (recovered). Patient is status post cardiac surgery on 9/13 and currently on ECMO. CRRT start 9/11. No signs of renal recovery.  UF as tolerated by BP and ECMO, Angiomax for anticoagulation.  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  Per CTS AHRF: VDRF, per primary service. Abx/antifungal per primary service Postcardiotomy vasoplegia/shock with failure to wean: Currently on ECMO per HF team.  On pressors per primary service but requirements decreasing.  Volume managing with CRRT. S/p washout 06-02-2023. Started on midodrine. Anemia, thrombocytopenia: Transfuse as needed per primary team. Hypophosphatemia - repletion prn

## 2023-06-04 NOTE — Progress Notes (Signed)
ANTICOAGULATION CONSULT NOTE - Follow up Consult  Pharmacy Consult for bivalirudin Indication:  VV ECMO  Allergies  Allergen Reactions   Other Anaphylaxis    Mushrooms  Not listed on MAR    Plavix [Clopidogrel Bisulfate] Other (See Comments)    TTP Not listed on the [facility]   Fleet Enema [Enema] Other (See Comments)    Unknown reaction   Lovenox [Enoxaparin] Other (See Comments)    Unknown reaction   Morphine Other (See Comments)    Unknown reaction   Nsaids Other (See Comments)    Unknown reaction   Hydrocodone-Acetaminophen Itching    Patient Measurements: Height: 5\' 6"  (167.6 cm) Weight: 80.5 kg (177 lb 7.5 oz) IBW/kg (Calculated) : 63.8 Heparin Dosing Weight: 81.3 kg  Vital Signs: Temp: 98.2 F (36.8 C) (09/28 0400) Temp Source: Core (09/28 0400) Pulse Rate: 62 (09/28 0700)  Labs: Recent Labs    06/02/23 0403 06/02/23 0433 06/03/23 0405 06/03/23 0410 06/03/23 1606 06/03/23 1618 06/03/23 2004 06/04/23 0012 06/04/23 0413  HGB 8.7*   < > 8.4*   < > 8.3*   < > 8.8* 9.2* 8.6*  9.9*  HCT 25.5*   < > 24.9*   < > 25.2*   < > 26.0* 27.0* 26.1*  29.0*  PLT 26*   < > 44*  --  31*  --   --   --  25*  APTT 53*   < > 50*  --  52*  --   --   --  48*  LABPROT 18.0*  --  17.6*  --   --   --   --   --  16.2*  INR 1.5*  --  1.4*  --   --   --   --   --  1.3*  CREATININE 0.98  1.03   < > 0.93  --  1.05  --   --   --  1.06   < > = values in this interval not displayed.    Estimated Creatinine Clearance: 71.1 mL/min (by C-G formula based on SCr of 1.06 mg/dL).   Assessment: 62 yom underwent Bentall/AVR, TV repair, re-implantation of SVG-RCA complicated with vasoplegia and pulmonary edema requiring central VA ECMO.   On 9/24 patient underwent transition VA to VV ECMO. Bivalirudin continuous infusion was held prior to procedure. Patient received a total of 5 units of PRBC, 5 units of platelets, and 2 units of FFP. Hgb stable at 9.5 and Plt up to 66. Of note, CRRT was  stopped for procedure (~6 hours) but resumed upon arrival back to the floor at 1800.  No AC PTA.  Resumed bivalirudin the morning after arriving back from the OR following ECMO VA > VV.   No clots in circuit. No fibrin streaks. LDH and fibrinogen both elevated but stable.  aPTT this morning is subtherapeutic at 48 sec on bivalirudin 0.003 mg/kg/hour.    Goal of Therapy:  aPTT 50-70 seconds Monitor platelets by anticoagulation protocol: Yes   Plan:  Increase bivalirudin at 0.004 mg/kg/hr  Check aPTT at 1700 Monitor q12 hr aPTT and CBC, LDH, fibrinogen and for s/sx of bleeding.  Wilmer Floor, PharmD PGY2 Cardiology Pharmacy Resident 06/04/2023  7:04 AM

## 2023-06-04 NOTE — Progress Notes (Signed)
NAME:  [name], MRN:  [id number], DOB:  [birth year], LOS: 17 ADMISSION DATE:  05/21/2023, CONSULTATION DATE:  9/11 REFERRING MD:  Dr. Izora Ribas, CHIEF COMPLAINT:  aortic valve dehiscence   History of Present Illness:  Patient is [name] w/ pertinent PMH CAD s/p CABG 2017 w/ AVR, HLD, HTN, prior CVA, T2DM, CKD4 was previously on HD presents to [facility] on 9/10 chest pain.  Patient recently admitted to [facility] on 5/25 w/ AKI and AMS. While in ED patient had PEA arrest w/ ROSC in about 8 minutes. Patient required dialysis on this admission. Also w/ MSSA bacteremia associated w/ tricuspid valve endocarditis. Patient transferred to [facility] on 5/25. Treated w/ 6 week course oxacillin and rifampin. This admission also suffered a R cerebellar CVA w/ MRI likely septic emboli and had cholangitis/pancreatitis requiring ERCP.  On 9/10 patient admitted to [facility] w/ chest pain, dyspnea, and BLE edema. BNP 2,097. CXR w/ pulm edema. Patient started on IV lasix infusion. Cards consulted. On 9/11 patient transferred to [facility]. Patient's echo showing possible aortic valve dehiscence. Cultures repeated and started on rocephin. Patient transferred to ICU for TEE and general anesthesia and to start CRRT. PCCM consulted.  Pertinent  Medical History   Past Medical History:  Diagnosis Date   Allergy    Anemia    Anxiety    Baker's cyst of knee    Blood transfusion without reported diagnosis    as baby    Coronary artery disease    quadruple bypass - March 2016   GERD (gastroesophageal reflux disease)    Gouty arthritis    "real bad" (01/17/2013)   Heart murmur    Hypercholesteremia    Hypertension    MSSA bacteremia 01/29/2023   Myocardial infarction (HCC) 2017   PEA (Pulseless electrical activity) (HCC) 01/29/2023   Stroke (HCC)    Type II diabetes mellitus (HCC)     Significant Hospital Events: Including procedures, antibiotic start and stop dates in addition to other pertinent events    9/10 admitted 9/11 echo showing aortic valve dehiscence, confirmed by TEE.  9/11 started (back) on CRRT for volume overload and known CKD 4 9/12 breathing better after CRRT 9/13 to OR for Redo of aortic valve, tricuspid valve repair and ascending aortic root replacement w/ re-implantation of SVG to RCA. Post op TEE EF 55% RV mid-mod HK, AVR/TV repair stable. Could not come off CPB. Worse pulmonary edema. High dose pressors. Cannulated and started on VA ECMO w/ central cannulation  9/16 MDT ECMO rounds this AM, plans to remove more volume, 1 U PRBCs  9/18 bronch, clot and mucus plugs removed  9/19 OR for washout  9/20 bronch 9/21 bronch Jun 05, 2023 VA to VV, some bleeding and acidemia issues postop 9/25 ett exchange and bronch  Interim History / Subjective:  Remains on low-dose epinephrine.  Overnight no acute events.   Objective   Blood pressure 120/66, pulse 62, temperature 98.2 F (36.8 C), temperature source Core, resp. rate (!) 30, height 5\' 6"  (1.676 m), weight 80.5 kg, SpO2 100%. CVP:  [10 mmHg-89 mmHg] 16 mmHg  Vent Mode: PCV FiO2 (%):  [40 %-50 %] 40 % Set Rate:  [24 bmp] 24 bmp Vt Set:  [400 mL] 400 mL PEEP:  [10 cmH20] 10 cmH20 Plateau Pressure:  [27 cmH20-29 cmH20] 27 cmH20   Intake/Output Summary (Last 24 hours) at 06/04/2023 0735 Last data filed at 06/04/2023 0700 Gross per 24 hour  Intake 3377.18 ml  Output  6569 ml  Net -3191.82 ml   Filed Weights   06/02/23 0500 06/03/23 0630 06/04/23 0500  Weight: 86.3 kg 82.6 kg 80.5 kg    Examination: Critically ill appearing man lying in bed in NAD [place]/AT, eyes anicteric, conjunctival injection. ETT in place Absent R breath sounds, rhales on the left. Pplat 26.  S1S2, RRR, junctional rhythm, occasionally flipping axis on tele. Perfusing equally with both rhythms. Abd soft, NT, hypoactive bowel sounds Missing distal 2 digits on R hand, devitalized toes on left foot.  No edema Skin warm, dry, no diffuse rashes.  RASS -5, no  response to stimulation. Breathing over the vent.   7.3 7/46/205/27 BUN 29, creatinine 1.06 AST 108 ALT 16 Total bilirubin 4.7 WBC 41.8 H/H 8.6/26.1 Platelets 25 Fibrinogen 530 INR 1.3 PTT 48 Cryptococcal antigen negative Blasto antigen pending 9/25 resp culture: few mold 9/20 BAL fungal culture: fungal elements positive; Candida albicans.  CXR personally reviewed-right greater than left opacities, left opacities increased compared to several days ago.  Endotracheal tube about 4.5 cm above the carina. RIJ ECMO cannula.   Assessment & Plan:   Post cardiotomy vasoplegia & cardioplegia w/ failure to wean from CPB- improved; 9/24 converted to VV [facility] for refractory hypoxia. Acute on chronic combined HF, Hx MSSA endocarditis- S/p AVR/Bentall and reimplantation of Coronaries and TV repair 9/13 CAD s/p CABG in 2017 w/ AVR Junctional rhythm-stable without pacing -zetia> on hold until able to take orals -holding aspirin, statin -tele monitoring -con't epi to maintain MAP >65, SBP >100  Acute respiratory failure w/ hypoxia due to volume overload Bilateral pneumonia & ARDS- concern for fungal pneumonia History of untreated OSA -change antifungal to isavuconazole to broaden coverage -follow cultures -ID deferred amphotericin -con't vanc & meropenem -Con't VV ECMO. Currently on sweep 6L -LTVV; rest vent settings. Con't current settings PS 18/PEEP 10, 40% FiO2  CKD 4. Had recently been on HD- now on CRRT Circuit related hemolysis, thrombocytopenia consumptive- stable -con't CRRT -strict I/O -renally dose meds, avoid nephrotoxic meds -appreciate nephrology's management  TF intolerance- ongoing issue now on TPN -con't TPN -con't NGT to LIWS; hopefully off sedation can retry enteral nutrition in a few days -con't reglan  Acute encephalopathy, concern for metabolic cause from heavy sedation, but not waking up off sedation for several days.  No acute findings on head CT 9/27.  Hx  of CVA -con't to hold all sedation -holding statin and aspirin  HTN HLD -holding PTA antihypertensives and zetia  DMT2 -SSI PRN -increase semglee to 12 units BID -goal BG 140-180 -no insulin in TPN  Leukocytosis, multifactorial  -monitor  Anemia due to critical illness, operative blood loss Thrombocytopenia -transfuse for Hb <7.5, platelets only if bleeding -low dose bival    Best Practice (right click and "Reselect all SmartList Selections" daily)   Diet/type: TPN DVT prophylaxis: bival GI prophylaxis: PPI Lines: Central line and Dialysis Catheter Foley: yes Code Status:  full code Last date of multidisciplinary goals of care discussion [per TCTS and AHF]   This patient is critically ill with multiple organ system failure which requires frequent high complexity decision making, assessment, support, evaluation, and titration of therapies. This was completed through the application of advanced monitoring technologies and extensive interpretation of multiple databases. During this encounter critical care time was devoted to patient care services described in this note for 45 minutes.  Steffanie Dunn, DO 06/04/23 9:59 AM  Pulmonary & Critical Care  For contact information, see Amion. If no response  to pager, please call PCCM consult pager. After hours, 7PM- 7AM, please call Elink.

## 2023-06-04 NOTE — Progress Notes (Signed)
PHARMACY - TOTAL PARENTERAL NUTRITION CONSULT NOTE   Indication:  unable to tolerate tube feeding  Patient Measurements: Height: 5\' 6"  (167.6 cm) Weight: 80.5 kg (177 lb 7.5 oz) IBW/kg (Calculated) : 63.8 TPN AdjBW (KG): 69 Body mass index is 28.64 kg/m. Usual Weight: ~90kg  Assessment: 62 yo male with significant PMH for HLD, HTN, T2DM, CKD4 (prev on HD) and recent MSSA bacteremia + endocarditis (May 2024). Back in May 2024 admission, underwent ERCP for cholangitis/pancreatitis. Admitted on 9/11 w/ chest pain found to have aortic valve dehiscence. S/p aortic valve replacement redo on 9/13 - developed pulmonary edema post op and was placed on [place] ECMO, transitioned to VV ECMO 9/24. S/p mediastinal washout on 9/19 - chest still open. Unable to speak w/ patient due to intubation (utilizing Precedex for sedation). Pharmacy consulted to manage TPN for inability to tolerate tube feeding and malnutrition.   Glucose / Insulin: hx T2DM - A1c 6.1%, on Farxiga PTA. Previously on insulin gtt post CT surgery >> transitioned to rSSI q4h. 39 units SSI used/24 hours. CBGs 190-220s. Semglee changed to detemir 10u q12h 9/27. Electrolytes: all WNL Renal: Hx CKD4 currently on CRRT (required HD in the past), using 4K bags, systemic bivalirudin for anticoag - not using systemic sodium citrate infusion, only for catheter lock per d/w RN at bedside on 9/24 CRRT 9/11 >>  Hepatic: Alk phos 90, AST 108- trending down, ALT 16 - rising slightly, Tbili down to 1.9, albumin 2, TG 122 (9/23) Intake / Output:  - OG output 75mL - Stool output 230 mL, receiving Reglan 5mg  IV q6h - Chest tube output 45mL - CRRT removed 6,258mL - NGT placed 9/22, Cortrak placed 9/20  MIVF: NS @ K[facility] GI Imaging:  - CT A/P 9/27: mild wall thickening in several small bowel loops suggesting enteritis, cholelithiasis GI Surgeries / Procedures:  - 9/24 transition to VV ECMO  Central access: 9/13 TPN start date: 9/23  Nutritional Goals: Goal  concentrated TPN rate is 75 mL/hr (provides 131 g of protein and 2250 kcals per day)  RD Assessment: Estimated Needs Total Energy Estimated Needs: 2200-2400 Total Protein Estimated Needs: 130-150 grams Total Fluid Estimated Needs: 1L plus UOP  Current Nutrition:  NPO and TPN  Plan:  Continue concentrated TPN at 75 mL/hr at 1800 - will provide 2250 kcal and 131 g protein meeting ~100% of needs Electrolytes in TPN: Na 11mEq/L, K 59mEq/L, Ca 20mEq/L, Mg 38mEq/L, and Phos 20 mmol/L. Max chloride (still getting some bicarb) (TPN compounder unable to formulate other Cl:Ac ratios with current formula) Standard MVI and trace elements within TPN No chromium (on CRRT and currently on shortage) Thiamine discontinued from TPN s/p 5 days for refeeding risk (9/23 >> 9/27) Continue Resistant q4h SSI  Increase detemir 12 units SQ BID per discussion with team - can consider adding to TPN bag once needs determined.  Continue MIVF at [facility] Monitor TPN labs on Mon/Thurs, RFP BID while on CRRT   Wilburn Cornelia, PharmD, BCPS Clinical Pharmacist 06/04/2023 9:15 AM   Please refer to [facility] for pharmacy phone number

## 2023-06-04 NOTE — Procedures (Signed)
I saw and evaluated the patient on CRRT.  I reviewed the last 24 hours events.  Adjustments to CRRT prescription are made as needed. Ceasar Mons Weights   06/02/23 0500 06/03/23 0630 06/04/23 0500  Weight: 86.3 kg 82.6 kg 80.5 kg    Recent Labs  Lab 06/04/23 0413 06/04/23 0843  NA 135  136 138  K 4.4  4.3 4.3  CL 101  --   CO2 23  --   GLUCOSE 196*  --   BUN 29*  --   CREATININE 1.06  --   CALCIUM 8.2*  --   PHOS 3.4  --     Recent Labs  Lab 05/29/23 1722 05/30/23 0002 06/03/23 0405 06/03/23 0410 06/03/23 1606 06/03/23 1618 06/04/23 0012 06/04/23 0413 06/04/23 0843  WBC 49.2*   < > 44.4*  --  43.3*  --   --  41.8*  --   NEUTROABS 25.7*  --   --   --   --   --   --   --   --   HGB 7.9*   < > 8.4*   < > 8.3*   < > 9.2* 8.6*  9.9* 9.2*  HCT 24.5*   < > 24.9*   < > 25.2*   < > 27.0* 26.1*  29.0* 27.0*  MCV 90.7   < > 88.3  --  90.6  --   --  90.0  --   PLT 17*   < > 44*  --  31*  --   --  25*  --    < > = values in this interval not displayed.    Scheduled Meds:  atorvastatin  80 mg Per Tube q1800   bisacodyl  10 mg Oral Daily   Or   bisacodyl  10 mg Rectal Daily   Chlorhexidine Gluconate Cloth  6 each Topical Daily   docusate  100 mg Per Tube BID   ezetimibe  10 mg Per Tube Daily   insulin aspart  0-20 Units Subcutaneous Q4H   insulin detemir  12 Units Subcutaneous BID   metoCLOPramide (REGLAN) injection  5 mg Intravenous Q6H   mouth rinse  15 mL Mouth Rinse Q2H   pantoprazole (PROTONIX) IV  40 mg Intravenous QHS   sodium chloride flush  3 mL Intravenous Q12H   Continuous Infusions:   prismasol BGK 4/2.5 400 mL/hr at 06/03/23 2259    prismasol BGK 4/2.5 400 mL/hr at 06/03/23 1210   sodium chloride Stopped (05/24/23 0032)   sodium chloride 10 mL/hr at 06/04/23 0900   albumin human Stopped (05/30/23 0819)   anticoagulant sodium citrate     bivalirudin (ANGIOMAX) 250 mg in sodium chloride 0.9 % 500 mL (0.5 mg/mL) infusion 0.004 mg/kg/hr (06/04/23 0900)    dexmedetomidine (PRECEDEX) IV infusion Stopped (06/03/23 1111)   epinephrine 2 mcg/min (06/04/23 0900)   HYDROmorphone Stopped (06/02/23 0247)   meropenem (MERREM) IV Stopped (06/04/23 9563)   micafungin (MYCAMINE) 200 mg in sodium chloride 0.9 % 100 mL IVPB 110 mL/hr at 06/04/23 0900   midazolam Stopped (06/02/23 0247)   norepinephrine (LEVOPHED) Adult infusion Stopped (06/01/23 1405)   prismasol BGK 4/2.5 1,500 mL/hr at 06/04/23 0747   TPN ADULT (ION) 75 mL/hr at 06/04/23 0900   TPN ADULT (ION)     vancomycin Stopped (06/03/23 2358)   vasopressin Stopped (06/04/23 0127)   PRN Meds:.sodium chloride, sodium chloride, albumin human, anticoagulant sodium citrate, artificial tears, dextrose, HYDROmorphone, midazolam, midazolam, morphine  injection, ondansetron (ZOFRAN) IV, mouth rinse, oxidized cellulose, oxyCODONE, sodium chloride flush, traMADol   Louie Bun,  MD 06/04/2023, 9:34 AM

## 2023-06-05 ENCOUNTER — Inpatient Hospital Stay (HOSPITAL_COMMUNITY): Payer: PPO

## 2023-06-05 DIAGNOSIS — Z9911 Dependence on respirator [ventilator] status: Secondary | ICD-10-CM | POA: Diagnosis not present

## 2023-06-05 DIAGNOSIS — T8201XA Breakdown (mechanical) of heart valve prosthesis, initial encounter: Secondary | ICD-10-CM | POA: Diagnosis not present

## 2023-06-05 DIAGNOSIS — R570 Cardiogenic shock: Secondary | ICD-10-CM | POA: Diagnosis not present

## 2023-06-05 DIAGNOSIS — D649 Anemia, unspecified: Secondary | ICD-10-CM | POA: Diagnosis not present

## 2023-06-05 DIAGNOSIS — I25810 Atherosclerosis of coronary artery bypass graft(s) without angina pectoris: Secondary | ICD-10-CM | POA: Diagnosis not present

## 2023-06-05 DIAGNOSIS — J80 Acute respiratory distress syndrome: Secondary | ICD-10-CM | POA: Diagnosis not present

## 2023-06-05 LAB — RENAL FUNCTION PANEL
Albumin: 2 g/dL — ABNORMAL LOW (ref 3.5–5.0)
Albumin: 2 g/dL — ABNORMAL LOW (ref 3.5–5.0)
Anion gap: 14 (ref 5–15)
Anion gap: 13 (ref 5–15)
BUN: 34 mg/dL — ABNORMAL HIGH (ref 8–23)
BUN: 36 mg/dL — ABNORMAL HIGH (ref 8–23)
CO2: 23 mmol/L (ref 22–32)
CO2: 22 mmol/L (ref 22–32)
Calcium: 8.6 mg/dL — ABNORMAL LOW (ref 8.9–10.3)
Calcium: 8.5 mg/dL — ABNORMAL LOW (ref 8.9–10.3)
Chloride: 98 mmol/L (ref 98–111)
Chloride: 98 mmol/L (ref 98–111)
Creatinine, Ser: 1.11 mg/dL (ref 0.61–1.24)
Creatinine, Ser: 1.11 mg/dL (ref 0.61–1.24)
GFR, Estimated: >60 mL/min (ref >60)
GFR, Estimated: >60 mL/min (ref >60)
Glucose, Bld: 171 mg/dL — ABNORMAL HIGH (ref 70–99)
Glucose, Bld: 206 mg/dL — ABNORMAL HIGH (ref 70–99)
Phosphorus: 3.1 mg/dL (ref 2.5–4.6)
Phosphorus: 3.5 mg/dL (ref 2.5–4.6)
Potassium: 4.2 mmol/L (ref 3.5–5.1)
Potassium: 4.1 mmol/L (ref 3.5–5.1)
Sodium: 135 mmol/L (ref 135–145)
Sodium: 133 mmol/L — ABNORMAL LOW (ref 135–145)

## 2023-06-05 LAB — APTT
aPTT: 45 s — ABNORMAL HIGH (ref 24–36)
aPTT: 46 s — ABNORMAL HIGH (ref 24–36)

## 2023-06-05 LAB — POCT I-STAT 7, (LYTES, BLD GAS, ICA,H+H)
Acid-Base Excess: 0 mmol/L (ref 0.0–2.0)
Acid-Base Excess: 0 mmol/L (ref 0.0–2.0)
Acid-base deficit: 1 mmol/L (ref 0.0–2.0)
Bicarbonate: 25 mmol/L (ref 20.0–28.0)
Bicarbonate: 24.8 mmol/L (ref 20.0–28.0)
Bicarbonate: 24.2 mmol/L (ref 20.0–28.0)
Calcium, Ion: 1.18 mmol/L (ref 1.15–1.40)
Calcium, Ion: 1.16 mmol/L (ref 1.15–1.40)
Calcium, Ion: 1.17 mmol/L (ref 1.15–1.40)
HCT: 26 % — ABNORMAL LOW (ref 39.0–52.0)
HCT: 29 % — ABNORMAL LOW (ref 39.0–52.0)
HCT: 28 % — ABNORMAL LOW (ref 39.0–52.0)
Hemoglobin: 8.8 g/dL — ABNORMAL LOW (ref 13.0–17.0)
Hemoglobin: 9.9 g/dL — ABNORMAL LOW (ref 13.0–17.0)
Hemoglobin: 9.5 g/dL — ABNORMAL LOW (ref 13.0–17.0)
O2 Saturation: 100 %
O2 Saturation: 99 %
O2 Saturation: 99 %
Patient temperature: 37
Patient temperature: 36.3
Patient temperature: 36.2
Potassium: 4.2 mmol/L (ref 3.5–5.1)
Potassium: 4.4 mmol/L (ref 3.5–5.1)
Potassium: 4.3 mmol/L (ref 3.5–5.1)
Sodium: 135 mmol/L (ref 135–145)
Sodium: 134 mmol/L — ABNORMAL LOW (ref 135–145)
Sodium: 136 mmol/L (ref 135–145)
TCO2: 26 mmol/L (ref 22–32)
TCO2: 26 mmol/L (ref 22–32)
TCO2: 25 mmol/L (ref 22–32)
pCO2 arterial: 40.2 mm[Hg] (ref 32–48)
pCO2 arterial: 38.4 mm[Hg] (ref 32–48)
pCO2 arterial: 38.8 mm[Hg] (ref 32–48)
pH, Arterial: 7.403 (ref 7.35–7.45)
pH, Arterial: 7.415 (ref 7.35–7.45)
pH, Arterial: 7.399 (ref 7.35–7.45)
pO2, Arterial: 166 mm[Hg] — ABNORMAL HIGH (ref 83–108)
pO2, Arterial: 159 mm[Hg] — ABNORMAL HIGH (ref 83–108)
pO2, Arterial: 158 mm[Hg] — ABNORMAL HIGH (ref 83–108)

## 2023-06-05 LAB — BASIC METABOLIC PANEL
Anion gap: 14 (ref 5–15)
BUN: 34 mg/dL — ABNORMAL HIGH (ref 8–23)
CO2: 22 mmol/L (ref 22–32)
Calcium: 8.6 mg/dL — ABNORMAL LOW (ref 8.9–10.3)
Chloride: 99 mmol/L (ref 98–111)
Creatinine, Ser: 1.13 mg/dL (ref 0.61–1.24)
GFR, Estimated: >60 mL/min (ref >60)
Glucose, Bld: 173 mg/dL — ABNORMAL HIGH (ref 70–99)
Potassium: 4.2 mmol/L (ref 3.5–5.1)
Sodium: 135 mmol/L (ref 135–145)

## 2023-06-05 LAB — PROTIME-INR
INR: 1.4 — ABNORMAL HIGH (ref 0.8–1.2)
Prothrombin Time: 17.6 s — ABNORMAL HIGH (ref 11.4–15.2)

## 2023-06-05 LAB — HEPATIC FUNCTION PANEL
ALT: 17 U/L (ref 0–44)
AST: 116 U/L — ABNORMAL HIGH (ref 15–41)
Albumin: 2 g/dL — ABNORMAL LOW (ref 3.5–5.0)
Alkaline Phosphatase: 92 U/L (ref 38–126)
Bilirubin, Direct: 3 mg/dL — ABNORMAL HIGH (ref 0.0–0.2)
Indirect Bilirubin: 2.2 mg/dL — ABNORMAL HIGH (ref 0.3–0.9)
Total Bilirubin: 5.2 mg/dL — ABNORMAL HIGH (ref 0.3–1.2)
Total Protein: 7.7 g/dL (ref 6.5–8.1)

## 2023-06-05 LAB — CBC
HCT: 25 % — ABNORMAL LOW (ref 39.0–52.0)
HCT: 25.2 % — ABNORMAL LOW (ref 39.0–52.0)
Hemoglobin: 8.1 g/dL — ABNORMAL LOW (ref 13.0–17.0)
Hemoglobin: 8.3 g/dL — ABNORMAL LOW (ref 13.0–17.0)
MCH: 29.3 pg (ref 26.0–34.0)
MCH: 30.3 pg (ref 26.0–34.0)
MCHC: 32.4 g/dL (ref 30.0–36.0)
MCHC: 32.9 g/dL (ref 30.0–36.0)
MCV: 90.6 fL (ref 80.0–100.0)
MCV: 92 fL (ref 80.0–100.0)
Platelets: 25 10*3/uL — CL (ref 150–400)
Platelets: 28 10*3/uL — CL (ref 150–400)
RBC: 2.76 MIL/uL — ABNORMAL LOW (ref 4.22–5.81)
RBC: 2.74 MIL/uL — ABNORMAL LOW (ref 4.22–5.81)
RDW: 17.6 % — ABNORMAL HIGH (ref 11.5–15.5)
RDW: 17.5 % — ABNORMAL HIGH (ref 11.5–15.5)
WBC: 44.5 10*3/uL — ABNORMAL HIGH (ref 4.0–10.5)
WBC: 42.7 10*3/uL — ABNORMAL HIGH (ref 4.0–10.5)
nRBC: 1.1 % — ABNORMAL HIGH (ref 0.0–0.2)
nRBC: 1.1 % — ABNORMAL HIGH (ref 0.0–0.2)

## 2023-06-05 LAB — CG4 I-STAT (LACTIC ACID): Lactic Acid, Venous: 1.9 mmol/L (ref 0.5–1.9)

## 2023-06-05 LAB — LACTATE DEHYDROGENASE: LDH: 979 U/L — ABNORMAL HIGH (ref 98–192)

## 2023-06-05 LAB — FIBRINOGEN: Fibrinogen: 557 mg/dL — ABNORMAL HIGH (ref 210–475)

## 2023-06-05 LAB — GLUCOSE, CAPILLARY
Glucose-Capillary: 158 mg/dL — ABNORMAL HIGH (ref 70–99)
Glucose-Capillary: 204 mg/dL — ABNORMAL HIGH (ref 70–99)
Glucose-Capillary: 220 mg/dL — ABNORMAL HIGH (ref 70–99)
Glucose-Capillary: 203 mg/dL — ABNORMAL HIGH (ref 70–99)
Glucose-Capillary: 222 mg/dL — ABNORMAL HIGH (ref 70–99)

## 2023-06-05 LAB — MAGNESIUM: Magnesium: 2.4 mg/dL (ref 1.7–2.4)

## 2023-06-05 MED ORDER — DEXTROSE 5% FOR FLUSHING BEFORE AND AFTER AMBISOME
10.0000 mL | INTRAVENOUS | Status: DC
  Administered 2023-06-05 – 2023-06-07 (×3): 10 mL via INTRAVENOUS
  Filled 2023-06-05 (×3): qty 250

## 2023-06-05 MED ORDER — HYDROMORPHONE HCL 1 MG/ML IJ SOLN
1.0000 mg | INTRAMUSCULAR | Status: DC | PRN
  Administered 2023-06-06 – 2023-06-10 (×11): 1 mg via INTRAVENOUS
  Filled 2023-06-05 (×11): qty 1

## 2023-06-05 MED ORDER — INSULIN ASPART 100 UNIT/ML IJ SOLN
5.0000 [IU] | INTRAMUSCULAR | Status: DC
  Administered 2023-06-05 – 2023-06-10 (×29): 5 [IU] via SUBCUTANEOUS

## 2023-06-05 MED ORDER — TRACE MINERALS CU-MN-SE-ZN 300-55-60-3000 MCG/ML IV SOLN
INTRAVENOUS | Status: AC
  Filled 2023-06-05: qty 876

## 2023-06-05 MED ORDER — INSULIN DETEMIR 100 UNIT/ML ~~LOC~~ SOLN
16.0000 [IU] | Freq: Two times a day (BID) | SUBCUTANEOUS | Status: DC
  Administered 2023-06-05 (×2): 16 [IU] via SUBCUTANEOUS
  Filled 2023-06-05 (×4): qty 0.16

## 2023-06-05 NOTE — Progress Notes (Signed)
Subjective: Intubated on ECMO   Antibiotics:  Anti-infectives (From admission, onward)    Start     Dose/Rate Route Frequency Ordered Stop   06/04/23 2200  ceFAZolin (ANCEF) IVPB 2g/100 mL premix        2 g 200 mL/hr over 30 Minutes Intravenous Every 12 hours 06/04/23 1535     06/04/23 1630  micafungin (MYCAMINE) 200 mg in sodium chloride 0.9 % 100 mL IVPB        200 mg 110 mL/hr over 1 Hours Intravenous Every 24 hours 06/04/23 1535     06/04/23 1545  amphotericin B liposome (AMBISOME) 240 mg in dextrose 5 % 500 mL IVPB        3 mg/kg  80.5 kg 280 mL/hr over 120 Minutes Intravenous Every 24 hours 06/04/23 1439     06/04/23 1400  amphotericin B liposome (AMBISOME) 240 mg in dextrose 5 % 500 mL IVPB  Status:  Discontinued        3 mg/kg  80.5 kg 280 mL/hr over 120 Minutes Intravenous Every 24 hours 06/04/23 1114 06/04/23 1130   06/04/23 1400  posaconazole (NOXAFIL) 300 mg in sodium chloride 0.9 % 150 mL IVPB  Status:  Discontinued       Placed in "And" Linked Group   300 mg 111.1 mL/hr over 90 Minutes Intravenous Every 12 hours 06/04/23 1310 06/04/23 1439   06/04/23 1400  posaconazole (NOXAFIL) 300 mg in sodium chloride 0.9 % 150 mL IVPB  Status:  Discontinued       Placed in "And" Linked Group   300 mg 111.1 mL/hr over 90 Minutes Intravenous Every 24 hours 06/04/23 1310 06/04/23 1439   05/27/23 0830  micafungin (MYCAMINE) 200 mg in sodium chloride 0.9 % 100 mL IVPB  Status:  Discontinued        200 mg 110 mL/hr over 1 Hours Intravenous Every 24 hours 05/27/23 0732 06/04/23 1114   05/22/23 1830  meropenem (MERREM) 1 g in sodium chloride 0.9 % 100 mL IVPB  Status:  Discontinued        1 g 200 mL/hr over 30 Minutes Intravenous Every 8 hours 05/22/23 1740 06/04/23 1232   05/21/23 0100  vancomycin (VANCOCIN) IVPB 1000 mg/200 mL premix  Status:  Discontinued        1,000 mg 200 mL/hr over 60 Minutes Intravenous  Once 05/14/2023 2136 05/10/2023 2142   06/01/2023 2230  ceFAZolin  (ANCEF) IVPB 2g/100 mL premix  Status:  Discontinued        2 g 200 mL/hr over 30 Minutes Intravenous Every 8 hours 05/16/2023 2136 06/02/2023 2142   05/12/2023 0400  vancomycin (VANCOREADY) IVPB 1500 mg/300 mL  Status:  Discontinued        1,500 mg 150 mL/hr over 120 Minutes Intravenous To Surgery 05/19/23 1407 06/02/2023 2108   05/14/2023 0400  ceFAZolin (ANCEF) IVPB 2g/100 mL premix        2 g 200 mL/hr over 30 Minutes Intravenous To Surgery 05/19/23 1407 05/13/2023 1421   05/18/2023 0400  ceFAZolin (ANCEF) IVPB 2g/100 mL premix  Status:  Discontinued        2 g 200 mL/hr over 30 Minutes Intravenous To Surgery 05/19/23 1407 05/23/2023 2108   05/19/23 2200  vancomycin (VANCOCIN) IVPB 1000 mg/200 mL premix  Status:  Discontinued        1,000 mg 200 mL/hr over 60 Minutes Intravenous Every 24 hours 05/18/23 2038 06/04/23 1441   05/18/23 2200  vancomycin (  VANCOREADY) IVPB 2000 mg/400 mL        2,000 mg 200 mL/hr over 120 Minutes Intravenous  Once 05/18/23 2038 05/18/23 2350   05/18/23 2000  rifampin (RIFADIN) capsule 150 mg  Status:  Discontinued        150 mg Oral Daily 05/18/23 1900 05/18/23 1903   05/18/23 2000  cefTRIAXone (ROCEPHIN) 2 g in sodium chloride 0.9 % 100 mL IVPB  Status:  Discontinued        2 g 200 mL/hr over 30 Minutes Intravenous Every 24 hours 05/18/23 1902 05/22/23 2153       Medications: Scheduled Meds:  artificial tears   Both Eyes Q8H   bisacodyl  10 mg Rectal Daily   Chlorhexidine Gluconate Cloth  6 each Topical Daily   dextrose  10 mL Intravenous Q24H   insulin aspart  0-20 Units Subcutaneous Q4H   insulin aspart  5 Units Subcutaneous Q4H   insulin detemir  16 Units Subcutaneous BID   mouth rinse  15 mL Mouth Rinse Q2H   pantoprazole (PROTONIX) IV  40 mg Intravenous QHS   sodium chloride  250 mL Intravenous Q24H   sodium chloride  250 mL Intravenous Q24H   sodium chloride flush  3 mL Intravenous Q12H   Continuous Infusions:   prismasol BGK 4/2.5 400 mL/hr at  06/04/23 2343    prismasol BGK 4/2.5 400 mL/hr at 06/05/23 0153   sodium chloride Stopped (05/24/23 0032)   sodium chloride 10 mL/hr at 06/05/23 1100   albumin human Stopped (05/30/23 0819)   amphotericin B liposome (AMBISOME) 240 mg in dextrose 5 % 500 mL IVPB Stopped (06/05/23 0339)   anticoagulant sodium citrate     bivalirudin (ANGIOMAX) 250 mg in sodium chloride 0.9 % 500 mL (0.5 mg/mL) infusion 0.006 mg/kg/hr (06/05/23 1100)    ceFAZolin (ANCEF) IV Stopped (06/05/23 1036)   epinephrine 4 mcg/min (06/05/23 1100)   micafungin (MYCAMINE) 200 mg in sodium chloride 0.9 % 100 mL IVPB Stopped (06/04/23 1854)   norepinephrine (LEVOPHED) Adult infusion Stopped (06/01/23 1405)   prismasol BGK 4/2.5 1,500 mL/hr at 06/05/23 0929   TPN ADULT (ION) 75 mL/hr at 06/05/23 1100   TPN ADULT (ION)     vasopressin Stopped (06/04/23 0127)   PRN Meds:.sodium chloride, sodium chloride, acetaminophen, acetaminophen, albumin human, anticoagulant sodium citrate, artificial tears, dextrose, diphenhydrAMINE **OR** diphenhydrAMINE, HYDROmorphone (DILAUDID) injection, meperidine (DEMEROL) injection, midazolam, mouth rinse, oxidized cellulose, sodium chloride flush    Objective: Weight change: -1.7 kg  Intake/Output Summary (Last 24 hours) at 06/05/2023 1134 Last data filed at 06/05/2023 1104 Gross per 24 hour  Intake 4154.77 ml  Output 5560.2 ml  Net -1405.43 ml   Blood pressure (!) 119/56, pulse 72, temperature (!) 97.3 F (36.3 C), resp. rate (!) 32, height 5\' 6"  (1.676 m), weight 78.8 kg, SpO2 100%. Temp:  [97.2 F (36.2 C)-97.9 F (36.6 C)] 97.3 F (36.3 C) (09/29 1100) Pulse Rate:  [61-75] 72 (09/29 1100) Resp:  [17-36] 32 (09/29 1100) BP: (119-134)/(56-68) 119/56 (09/29 1100) SpO2:  [99 %-100 %] 100 % (09/29 1100) Arterial Line BP: (105-135)/(45-61) 119/56 (09/29 1100) FiO2 (%):  [40 %] 40 % (09/29 1100) Weight:  [78.8 kg] 78.8 kg (09/29 0500)  Physical Exam: Physical Exam Constitutional:       Interventions: He is intubated.  HENT:     Head: Normocephalic and atraumatic.  Cardiovascular:     Rate and Rhythm: Tachycardia present.  Pulmonary:     Effort: He is intubated.  Skin:    General: Skin is warm.  Neurological:     General: No focal deficit present.      CBC:    BMET Recent Labs    06/04/23 1604 06/04/23 1620 06/05/23 0346 06/05/23 0347 06/05/23 0751  NA 135   < > 135  135 135 134*  K 4.7   < > 4.2  4.2 4.2 4.4  CL 99  --  99  98  --   --   CO2 23  --  22  23  --   --   GLUCOSE 228*  --  173*  171*  --   --   BUN 31*  --  34*  34*  --   --   CREATININE 1.06  --  1.13  1.11  --   --   CALCIUM 8.3*  --  8.6*  8.6*  --   --    < > = values in this interval not displayed.     Liver Panel  Recent Labs    06/04/23 0413 06/04/23 1604 06/05/23 0346  PROT 7.5  --  7.7  ALBUMIN 2.0*  2.0* 2.0* 2.0*  2.0*  AST 108*  --  116*  ALT 16  --  17  ALKPHOS 90  --  92  BILITOT 4.7*  --  5.2*  BILIDIR 2.8*  --  3.0*  IBILI 1.9*  --  2.2*       Sedimentation Rate No results for input(s): "ESRSEDRATE" in the last 72 hours. C-Reactive Protein No results for input(s): "CRP" in the last 72 hours.  Micro Results: Recent Results (from the past 720 hour(s))  Culture, blood (Routine X 2) w Reflex to ID Panel     Status: None   Collection Time: 05/18/23  8:23 PM   Specimen: BLOOD RIGHT HAND  Result Value Ref Range Status   Specimen Description BLOOD RIGHT HAND  Final   Special Requests   Final    BOTTLES DRAWN AEROBIC AND ANAEROBIC Blood Culture results may not be optimal due to an excessive volume of blood received in culture bottles   Culture   Final    NO GROWTH 5 DAYS Performed at [facility], [address], [place], [place] [address]    Report Status 05/23/2023 FINAL  Final  Culture, blood (Routine X 2) w Reflex to ID Panel     Status: None   Collection Time: 05/18/23  8:27 PM   Specimen: BLOOD RIGHT HAND  Result Value Ref  Range Status   Specimen Description BLOOD RIGHT HAND  Final   Special Requests   Final    BOTTLES DRAWN AEROBIC AND ANAEROBIC Blood Culture results may not be optimal due to an excessive volume of blood received in culture bottles   Culture   Final    NO GROWTH 5 DAYS Performed at [facility], [address] [place], [place] [address]    Report Status 05/23/2023 FINAL  Final  SARS Coronavirus 2 by RT PCR (hospital order, performed in [facility] hospital lab) *cepheid single result test* Path Tissue     Status: None   Collection Time: 05/19/23  4:24 AM   Specimen: Path Tissue; Nasal Swab  Result Value Ref Range Status   SARS Coronavirus 2 by RT PCR NEGATIVE NEGATIVE Final    Comment: Performed at [facility], [address] [place], [place] [address]  Surgical PCR screen     Status: None  Collection Time: 05/19/23  4:25 AM   Specimen: Nasal Mucosa; Nasal Swab  Result Value Ref Range Status   MRSA, PCR NEGATIVE NEGATIVE Final   Staphylococcus aureus NEGATIVE NEGATIVE Final    Comment: (NOTE) The Xpert SA Assay (FDA approved for NASAL specimens in patients 46 years of age and older), is one component of a comprehensive surveillance program. It is not intended to diagnose infection nor to guide or monitor treatment. Performed at [facility], [address] [place], [place] [address]   Aerobic/Anaerobic Culture w Gram Stain (surgical/deep wound)     Status: None   Collection Time: 05/30/2023 11:27 AM   Specimen: Path Tissue  Result Value Ref Range Status   Specimen Description TISSUE  Final   Special Requests AORTIC VALVE  Final   Gram Stain NO WBC SEEN NO ORGANISMS SEEN   Final   Culture   Final    No growth aerobically or anaerobically. Performed at [facility], [address] [place], [place] [address]    Report Status 05/25/2023 FINAL  Final  Aerobic/Anaerobic Culture w Gram Stain (surgical/deep wound)     Status: None   Collection Time:  06/03/2023 12:08 PM   Specimen: Path Tissue  Result Value Ref Range Status   Specimen Description TISSUE  Final   Special Requests AORTIC VALVE LEAFLETS  Final   Gram Stain NO WBC SEEN NO ORGANISMS SEEN   Final   Culture   Final    No growth aerobically or anaerobically. Performed at [facility], [address] [place], [place] [address]    Report Status 05/25/2023 FINAL  Final  Culture, Respiratory w Gram Stain     Status: Abnormal   Collection Time: 05/27/23  8:37 AM   Specimen: Bronchoalveolar Lavage; Respiratory  Result Value Ref Range Status   Specimen Description BRONCHIAL ALVEOLAR LAVAGE  Final   Special Requests NONE  Final   Gram Stain   Final    ABUNDANT WBC PRESENT, PREDOMINANTLY PMN NO ORGANISMS SEEN Performed at [facility], [address] [place], [place] [address]    Culture (A)  Final    FUNGUS (MOLD) ISOLATED, PROBABLE CONTAMINANT/COLONIZER (SAPROPHYTE). CONTACT MICROBIOLOGY IF FURTHER IDENTIFICATION REQUIRED [phone].   Report Status 05/30/2023 FINAL  Final  Culture, fungus without smear     Status: Abnormal (Preliminary result)   Collection Time: 05/27/23  8:38 AM   Specimen: Bronchoalveolar Lavage; Other  Result Value Ref Range Status   Specimen Description BRONCHIAL ALVEOLAR LAVAGE  Final   Special Requests NONE  Final   Culture (A)  Final    CANDIDA ALBICANS MOLD CONTINUING TO HOLD ISOLATE REFERRED FOR IDENTIFICATION TO Sammuel Hines Performed at [facility], [address], [place], [place] [address]    Report Status PENDING  Incomplete  Fungus Culture With Stain     Status: Abnormal   Collection Time: 05/27/23  8:38 AM   Specimen: Bronchial Alveolar Lavage; Respiratory  Result Value Ref Range Status   Fungus Stain Final report (A)  Final   Fungus (Mycology) Culture Preliminary report (A)  Final    Comment: (NOTE) Performed At: [facility] [address] [place], [place] [id number] Jolene Schimke MD  XF:[id number]    Fungal Source BRONCHIAL ALVEOLAR LAVAGE  Final    Comment: Performed at [facility], [address] [place], [place] [address]  Fungus Culture Result     Status: Abnormal   Collection Time: 05/27/23  8:38 AM  Result Value Ref Range Status   Result 1 Comment (A)  Final    Comment: (NOTE) Fungal elements, such as arthroconidia, hyphal fragments, chlamydoconidia, observed. Performed At: [facility] [address] [place], [place] [id number] Jolene Schimke MD WU:[id number]   Fungal organism reflex     Status: Abnormal   Collection Time: 05/27/23  8:38 AM  Result Value Ref Range Status   Fungal result 1 Candida albicans (A)  Final    Comment: (NOTE) Scant growth Performed At: [facility] [address] [place], [place] [id number] Jolene Schimke MD VH:[id number]   Culture, Respiratory w Gram Stain     Status: None   Collection Time: 06/01/23  9:44 AM   Specimen: Bronchoalveolar Lavage; Respiratory  Result Value Ref Range Status   Specimen Description BRONCHIAL ALVEOLAR LAVAGE  Final   Special Requests NONE  Final   Gram Stain   Final    ABUNDANT WBC PRESENT, PREDOMINANTLY PMN NO ORGANISMS SEEN Performed at [facility], [address] [place], [place] [address]    Culture   Final    FEW FUNGUS (MOLD) ISOLATED, PROBABLE CONTAMINANT/COLONIZER (SAPROPHYTE). CONTACT MICROBIOLOGY IF FURTHER IDENTIFICATION REQUIRED [phone].   Report Status 06/03/2023 FINAL  Final    Studies/Results: DG CHEST PORT 1 VIEW  Result Date: 06/05/2023 CLINICAL DATA:  Postop aortic valve replacement and replacement of the ascending aortic root 2 weeks ago. ARDS on ECMO. EXAM: PORTABLE CHEST 1 VIEW COMPARISON:  Radiographs 06/04/2023 and 06/03/2023.  CT 06/03/2023. FINDINGS: 0556 hours. Two views submitted. Patient remains rotated to the left. Tip of the endotracheal tube is unchanged at the level of the mid trachea. Enteric tube is looped in the proximal  stomach. Right internal jugular and left subclavian venous catheters, bilateral chest tubes and ECMO apparatus appear unchanged. The heart size and mediastinal contours are stable post median sternotomy. Right greater than left airspace opacities and small bilateral pleural effusions are unchanged. No evidence of pneumothorax. IMPRESSION: Stable examination. Unchanged position of the support system. No significant change in bilateral airspace opacities and small bilateral pleural effusions. No evidence of pneumothorax. Electronically Signed   By: Carey Bullocks M.D.   On: 06/05/2023 09:54   DG CHEST PORT 1 VIEW  Result Date: 06/04/2023 CLINICAL DATA:  Adult respiratory distress syndrome. EXAM: PORTABLE CHEST 1 VIEW COMPARISON:  Chest radiograph and chest CT dated 06/03/2023. FINDINGS: An endotracheal tube terminates in the midthoracic trachea. An enteric tube reaches the distal esophagus and likely enters the stomach. Multiple mediastinal and thoracostomy tubes appear unchanged. An ECMO catheter appears unchanged. A right internal jugular central venous catheter appears unchanged. Moderate to severe right and moderate left airspace opacities have improved since prior exam. No significant pneumothorax. IMPRESSION: Improved bilateral airspace opacities. Electronically Signed   By: Romona Curls M.D.   On: 06/04/2023 10:28      Assessment/Plan:  INTERVAL HISTORY:  saprophyte growing in 2 cultures, and fungal culture seems to be growing an aspergillus which is UNDOUBTEDLY at most a colonizer rather than representative of invasive fungal infection (see commentary below)   Principal Problem:   Cardiogenic shock (HCC) Active Problems:   CAD (coronary artery disease)   Type 2 diabetes mellitus without complication, without long-term current use of insulin (HCC)   OSA on CPAP   Symptomatic anemia   Acute on chronic congestive heart failure (HCC)   Prosthetic aortic valve failure   Acute respiratory  failure with hypoxia (HCC)   Anemia  Protein-calorie malnutrition, severe    [name] is a 62 y.o. male with recent history of MSSA bacteremia and prosthetic tricuspid valve endocarditis and CVA with suspected septic emboli s/p treatment with oxacillin and rifampin presenting with chest pain and shortness of breath and found to have aortic valve dehiscence and now s/p aortic and tricuspid valve replacement. Course has been complicated by need for ECMO and inability to come off by-pass   He has grown prosthetic molds from 2 BAL cultures and on the fungal culture appears to be growing and Aspergillus.  Also grew a Candida species   Added Amphotericin to his echinocandin yesterday--see notes from yesterday re discussios with Dr. Retia Passe from [facility] and Dr. Chestine Spore also spoke with Transplant ID at [facility]     #1  Possible invasive mold such as Aspergillus:  For now we will continue Amphotericin and the echinocandin  There is mention of procuring cresemba but it would be an IV form since he has a small bowel obstruction that has provided [place] giving him posaconazole    #2 ARDS and leukocytosis on ECMO  Per CCM  Carbapenem and vancomycin stopped   #3 Endocarditis: On cefazolin  #4 Renal failure on CRRT  I have personally spent 50 minutes involved in face-to-face and non-face-to-face activities for this patient on the day of the visit. Professional time spent includes the following activities: Preparing to see the patient (review of tests), Obtaining and/or reviewing separately obtained history (admission/discharge record), Performing a medically appropriate examination and/or evaluation , Ordering medications/tests/procedures, referring and communicating with other health care professionals, Documenting clinical information in the EMR, Independently interpreting results (not separately reported), Communicating results to the patient/family/caregiver, Counseling and educating the  patient/family/caregiver and Care coordination (not separately reported).   Dr. Drue Second is back tomorrow.   LOS: 18 days   Acey Lav 06/05/2023, 11:34 AM

## 2023-06-05 NOTE — Plan of Care (Signed)
  Problem: Activity: Goal: Ability to tolerate increased activity will improve Outcome: Not Progressing   Problem: Respiratory: Goal: Ability to maintain a clear airway and adequate ventilation will improve Outcome: Progressing   Problem: Role Relationship: Goal: Method of communication will improve Outcome: Not Progressing   Problem: Education: Goal: Ability to manage disease process will improve Outcome: Progressing   Problem: Cardiac: Goal: Ability to achieve and maintain adequate cardiopulmonary perfusion will improve Outcome: Progressing   Problem: Neurologic: Goal: Promote progressive neurologic recovery Outcome: Progressing   Problem: Skin Integrity: Goal: Risk for impaired skin integrity will be minimized. Outcome: Progressing   Problem: Education: Goal: Ability to describe self-care measures that may prevent or decrease complications (Diabetes Survival Skills Education) will improve Outcome: Not Applicable   Problem: Coping: Goal: Ability to adjust to condition or change in health will improve Outcome: Not Applicable   Problem: Fluid Volume: Goal: Ability to maintain a balanced intake and output will improve Outcome: Progressing   Problem: Health Behavior/Discharge Planning: Goal: Ability to identify and utilize available resources and services will improve Outcome: Not Applicable Goal: Ability to manage health-related needs will improve Outcome: Not Applicable   Problem: Metabolic: Goal: Ability to maintain appropriate glucose levels will improve Outcome: Progressing   Problem: Nutritional: Goal: Maintenance of adequate nutrition will improve Outcome: Progressing Goal: Progress toward achieving an optimal weight will improve Outcome: Progressing   Problem: Skin Integrity: Goal: Risk for impaired skin integrity will decrease Outcome: Progressing   Problem: Tissue Perfusion: Goal: Adequacy of tissue perfusion will improve Outcome: Progressing    Problem: Education: Goal: Knowledge of General Education information will improve Description: Including pain rating scale, medication(s)/side effects and non-pharmacologic comfort measures Outcome: Not Applicable   Problem: Health Behavior/Discharge Planning: Goal: Ability to manage health-related needs will improve Outcome: Not Applicable   Problem: Clinical Measurements: Goal: Ability to maintain clinical measurements within normal limits will improve Outcome: Progressing Goal: Will remain free from infection Outcome: Progressing Goal: Diagnostic test results will improve Outcome: Progressing Goal: Respiratory complications will improve Outcome: Progressing Goal: Cardiovascular complication will be avoided Outcome: Progressing

## 2023-06-05 NOTE — Plan of Care (Signed)
Problem: Activity: Goal: Ability to tolerate increased activity will improve Outcome: Not Progressing   Problem: Respiratory: Goal: Ability to maintain a clear airway and adequate ventilation will improve Outcome: Not Progressing   Problem: Role Relationship: Goal: Method of communication will improve Outcome: Not Progressing   Problem: Education: Goal: Ability to manage disease process will improve Outcome: Not Progressing   Problem: Cardiac: Goal: Ability to achieve and maintain adequate cardiopulmonary perfusion will improve Outcome: Not Progressing   Problem: Neurologic: Goal: Promote progressive neurologic recovery Outcome: Not Progressing   Problem: Skin Integrity: Goal: Risk for impaired skin integrity will be minimized. Outcome: Not Progressing   Problem: Fluid Volume: Goal: Ability to maintain a balanced intake and output will improve Outcome: Not Progressing   Problem: Metabolic: Goal: Ability to maintain appropriate glucose levels will improve Outcome: Not Progressing   Problem: Nutritional: Goal: Maintenance of adequate nutrition will improve Outcome: Not Progressing Goal: Progress toward achieving an optimal weight will improve Outcome: Not Progressing   Problem: Skin Integrity: Goal: Risk for impaired skin integrity will decrease Outcome: Not Progressing   Problem: Tissue Perfusion: Goal: Adequacy of tissue perfusion will improve Outcome: Not Progressing   Problem: Clinical Measurements: Goal: Ability to maintain clinical measurements within normal limits will improve Outcome: Not Progressing Goal: Will remain free from infection Outcome: Not Progressing Goal: Diagnostic test results will improve Outcome: Not Progressing Goal: Respiratory complications will improve Outcome: Not Progressing Goal: Cardiovascular complication will be avoided Outcome: Not Progressing   Problem: Activity: Goal: Risk for activity intolerance will  decrease Outcome: Not Progressing   Problem: Nutrition: Goal: Adequate nutrition will be maintained Outcome: Not Progressing   Problem: Coping: Goal: Level of anxiety will decrease Outcome: Not Progressing   Problem: Elimination: Goal: Will not experience complications related to bowel motility Outcome: Not Progressing Goal: Will not experience complications related to urinary retention Outcome: Not Progressing   Problem: Pain Managment: Goal: General experience of comfort will improve Outcome: Not Progressing   Problem: Safety: Goal: Ability to remain free from injury will improve Outcome: Not Progressing   Problem: Skin Integrity: Goal: Risk for impaired skin integrity will decrease Outcome: Not Progressing   Problem: Education: Goal: Ability to describe self-care measures that may prevent or decrease complications (Diabetes Survival Skills Education) will improve Outcome: Not Progressing   Problem: Coping: Goal: Ability to adjust to condition or change in health will improve Outcome: Not Progressing   Problem: Fluid Volume: Goal: Ability to maintain a balanced intake and output will improve Outcome: Not Progressing   Problem: Health Behavior/Discharge Planning: Goal: Ability to identify and utilize available resources and services will improve Outcome: Not Progressing Goal: Ability to manage health-related needs will improve Outcome: Not Progressing   Problem: Metabolic: Goal: Ability to maintain appropriate glucose levels will improve Outcome: Not Progressing   Problem: Nutritional: Goal: Maintenance of adequate nutrition will improve Outcome: Not Progressing Goal: Progress toward achieving an optimal weight will improve Outcome: Not Progressing   Problem: Skin Integrity: Goal: Risk for impaired skin integrity will decrease Outcome: Not Progressing   Problem: Tissue Perfusion: Goal: Adequacy of tissue perfusion will improve Outcome: Not Progressing    Problem: Education: Goal: Will demonstrate proper wound care and an understanding of methods to prevent future damage Outcome: Not Progressing Goal: Knowledge of disease or condition will improve Outcome: Not Progressing Goal: Knowledge of the prescribed therapeutic regimen will improve Outcome: Not Progressing Goal: Individualized Educational Video(s) Outcome: Not Progressing   Problem: Activity: Goal: Risk for activity intolerance  will decrease Outcome: Not Progressing   Problem: Cardiac: Goal: Will achieve and/or maintain hemodynamic stability Outcome: Not Progressing   Problem: Clinical Measurements: Goal: Postoperative complications will be avoided or minimized Outcome: Not Progressing

## 2023-06-05 NOTE — Procedures (Signed)
I saw and evaluated the patient on CRRT.  I reviewed the last 24 hours events.  Adjustments to CRRT prescription are made as needed. [name] Weights   06/03/23 0630 06/04/23 0500 06/05/23 0500  Weight: 82.6 kg 80.5 kg 78.8 kg    Recent Labs  Lab 06/05/23 0346 06/05/23 0347 06/05/23 0751  NA 135  135   < > 134*  K 4.2  4.2   < > 4.4  CL 99  98  --   --   CO2 22  23  --   --   GLUCOSE 173*  171*  --   --   BUN 34*  34*  --   --   CREATININE 1.13  1.11  --   --   CALCIUM 8.6*  8.6*  --   --   PHOS 3.1  --   --    < > = values in this interval not displayed.    Recent Labs  Lab 05/29/23 1722 05/30/23 0002 06/04/23 0413 06/04/23 0843 06/04/23 1604 06/04/23 1620 06/05/23 0346 06/05/23 0347 06/05/23 0751  WBC 49.2*   < > 41.8*  --  41.9*  --  44.5*  --   --   NEUTROABS 25.7*  --   --   --   --   --   --   --   --   HGB 7.9*   < > 8.6*  9.9*   < > 8.5*   < > 8.1* 8.8* 9.9*  HCT 24.5*   < > 26.1*  29.0*   < > 26.6*   < > 25.0* 26.0* 29.0*  MCV 90.7   < > 90.0  --  93.7  --  90.6  --   --   PLT 17*   < > 25*  --  26*  --  25*  --   --    < > = values in this interval not displayed.    Scheduled Meds:  artificial tears   Both Eyes Q8H   bisacodyl  10 mg Rectal Daily   Chlorhexidine Gluconate Cloth  6 each Topical Daily   dextrose  10 mL Intravenous Q24H   insulin aspart  0-20 Units Subcutaneous Q4H   insulin aspart  5 Units Subcutaneous Q4H   insulin detemir  16 Units Subcutaneous BID   mouth rinse  15 mL Mouth Rinse Q2H   pantoprazole (PROTONIX) IV  40 mg Intravenous QHS   sodium chloride  250 mL Intravenous Q24H   sodium chloride  250 mL Intravenous Q24H   sodium chloride flush  3 mL Intravenous Q12H   Continuous Infusions:   prismasol BGK 4/2.5 400 mL/hr at 06/04/23 2343    prismasol BGK 4/2.5 400 mL/hr at 06/05/23 0153   sodium chloride Stopped (05/24/23 0032)   sodium chloride 10 mL/hr at 06/05/23 1000   albumin human Stopped (05/30/23 0819)    amphotericin B liposome (AMBISOME) 240 mg in dextrose 5 % 500 mL IVPB Stopped (06/05/23 0339)   anticoagulant sodium citrate     bivalirudin (ANGIOMAX) 250 mg in sodium chloride 0.9 % 500 mL (0.5 mg/mL) infusion 0.006 mg/kg/hr (06/05/23 1000)    ceFAZolin (ANCEF) IV 2 g (06/05/23 1006)   epinephrine 4 mcg/min (06/05/23 1000)   micafungin (MYCAMINE) 200 mg in sodium chloride 0.9 % 100 mL IVPB Stopped (06/04/23 1854)   norepinephrine (LEVOPHED) Adult infusion Stopped (06/01/23 1405)   prismasol BGK 4/2.5 1,500 mL/hr at 06/05/23 1610  TPN ADULT (ION) 75 mL/hr at 06/05/23 1000   TPN ADULT (ION)     vasopressin Stopped (06/04/23 0127)   PRN Meds:.sodium chloride, sodium chloride, acetaminophen, acetaminophen, albumin human, anticoagulant sodium citrate, artificial tears, dextrose, diphenhydrAMINE **OR** diphenhydrAMINE, HYDROmorphone (DILAUDID) injection, meperidine (DEMEROL) injection, midazolam, mouth rinse, oxidized cellulose, sodium chloride flush   Louie Bun,  MD 06/05/2023, 10:22 AM

## 2023-06-05 NOTE — Progress Notes (Signed)
Patient ID: [name], male   DOB: 23-Jun-1961, 62 y.o.   MRN: [id number]     Advanced Heart Failure Rounding Note  PCP-Cardiologist: Kristeen Miss, MD   Subjective:    9/13: OR for Bentall/AVR, TV repair, re-implantation of SVG-RCA.  Post-op vasoplegia and pulmonary edema, required central [facility] ECMO (RA drainage, ascending aorta return.  Post-op TEE EF 55%, mild-moderate RV hypokinesis.  9/14: Blender added to circuit due to high PaO2 9/16: TEE with EF 30%, moderate LVH, moderate RV dysfunction with mild enlargement, stable bioprosthetic aortic valve, repaired TV with mild TR, no endocarditis noted.  9/18: Bronchoscopy with mucus plugs and clots. 9/19: To OR for mediastinal washout.  By TEE, EF 50% with moderate LVH, mild RV enlargement and mild RV dysfunction, stable bioprosthetic aortic valve, repaired TV with mild TR.  He did not tolerate lowering of ECMO flows even with increasing of pressors due to vasoplegia.  9/21: Bronchoscopy  BAL: growing candida and dold 9/24: Decannulated from [facility] ECMO. Chest closed. Transitioned to VV ECMO  On VV ECMO. Circuit stable.   Remains intubated; sedation held now for 72H; some response to painful stimuli but otherwise no purposeful movement. Negative 1.6L over the past 24h.   Objective:   Weight Range: 78.8 kg Body mass index is 28.04 kg/m.   Vital Signs:   Temp:  [97.2 F (36.2 C)-99.6 F (37.6 C)] 97.3 F (36.3 C) (09/29 0900) Pulse Rate:  [61-75] 70 (09/29 0900) Resp:  [17-36] 33 (09/29 0900) BP: (134)/(68) 134/68 (09/29 0803) SpO2:  [99 %-100 %] 100 % (09/29 0900) Arterial Line BP: (105-135)/(45-61) 114/52 (09/29 0900) FiO2 (%):  [40 %] 40 % (09/29 0803) Weight:  [78.8 kg] 78.8 kg (09/29 0500) Last BM Date : 06/04/23  Weight change: Filed Weights   06/03/23 0630 06/04/23 0500 06/05/23 0500  Weight: 82.6 kg 80.5 kg 78.8 kg    Intake/Output:   Intake/Output Summary (Last 24 hours) at 06/05/2023 0923 Last data filed at 06/05/2023  0900 Gross per 24 hour  Intake 4123.39 ml  Output 5578.7 ml  Net -1455.31 ml      Physical Exam    General:  Critically-ill appearing. On vent.  HEENT: + ETT Neck:  +VV ECMO cannula + swan Cor: Chest dressing. + CTs   Lungs: minimal lung sounds on the right; very coarse w/ diffuse rales on the left Abdomen: soft, nontender, nondistended. No hepatosplenomegaly. No bruits or masses. Good bowel sounds. Extremities: no cyanosis, clubbing, rash, 1+ edema Neuro: intubated/sedated  Telemetry   Junctional rhythm + PVCs Personally reviewed  Labs    CBC Recent Labs    06/04/23 1604 06/04/23 1620 06/05/23 0346 06/05/23 0347 06/05/23 0751  WBC 41.9*  --  44.5*  --   --   HGB 8.5*   < > 8.1* 8.8* 9.9*  HCT 26.6*   < > 25.0* 26.0* 29.0*  MCV 93.7  --  90.6  --   --   PLT 26*  --  25*  --   --    < > = values in this interval not displayed.   Basic Metabolic Panel Recent Labs    40/98/11 0413 06/04/23 0843 06/04/23 1604 06/04/23 1620 06/05/23 0346 06/05/23 0347 06/05/23 0751  NA 135  136   < > 135   < > 135  135 135 134*  K 4.4  4.3   < > 4.7   < > 4.2  4.2 4.2 4.4  CL 101  --  99  --  99  98  --   --   CO2 23  --  23  --  22  23  --   --   GLUCOSE 196*  --  228*  --  173*  171*  --   --   BUN 29*  --  31*  --  34*  34*  --   --   CREATININE 1.06  --  1.06  --  1.13  1.11  --   --   CALCIUM 8.2*  --  8.3*  --  8.6*  8.6*  --   --   MG 2.3  --   --   --  2.4  --   --   PHOS 3.4  --  3.9  --  3.1  --   --    < > = values in this interval not displayed.   Liver Function Tests Recent Labs    06/04/23 0413 06/04/23 1604 06/05/23 0346  AST 108*  --  116*  ALT 16  --  17  ALKPHOS 90  --  92  BILITOT 4.7*  --  5.2*  PROT 7.5  --  7.7  ALBUMIN 2.0*  2.0* 2.0* 2.0*  2.0*   BNP: BNP (last 3 results) Recent Labs    05/25/2023 1100  BNP 2,097.0*     Imaging    No results found.   Medications:     Scheduled Medications:  artificial tears    Both Eyes Q8H   bisacodyl  10 mg Rectal Daily   Chlorhexidine Gluconate Cloth  6 each Topical Daily   dextrose  10 mL Intravenous Q24H   insulin aspart  0-20 Units Subcutaneous Q4H   insulin aspart  3 Units Subcutaneous Q4H   insulin detemir  12 Units Subcutaneous BID   mouth rinse  15 mL Mouth Rinse Q2H   pantoprazole (PROTONIX) IV  40 mg Intravenous QHS   sodium chloride  250 mL Intravenous Q24H   sodium chloride  250 mL Intravenous Q24H   sodium chloride flush  3 mL Intravenous Q12H    Infusions:   prismasol BGK 4/2.5 400 mL/hr at 06/04/23 2343    prismasol BGK 4/2.5 400 mL/hr at 06/05/23 0153   sodium chloride Stopped (05/24/23 0032)   sodium chloride 10 mL/hr at 06/05/23 0900   albumin human Stopped (05/30/23 0819)   amphotericin B liposome (AMBISOME) 240 mg in dextrose 5 % 500 mL IVPB Stopped (06/05/23 0339)   anticoagulant sodium citrate     bivalirudin (ANGIOMAX) 250 mg in sodium chloride 0.9 % 500 mL (0.5 mg/mL) infusion 0.006 mg/kg/hr (06/05/23 0900)    ceFAZolin (ANCEF) IV Stopped (06/04/23 2210)   epinephrine 3 mcg/min (06/05/23 0900)   HYDROmorphone Stopped (06/02/23 0247)   micafungin (MYCAMINE) 200 mg in sodium chloride 0.9 % 100 mL IVPB Stopped (06/04/23 1854)   midazolam Stopped (06/02/23 0247)   norepinephrine (LEVOPHED) Adult infusion Stopped (06/01/23 1405)   prismasol BGK 4/2.5 1,500 mL/hr at 06/05/23 0647   TPN ADULT (ION) 75 mL/hr at 06/05/23 0900   TPN ADULT (ION)     vasopressin Stopped (06/04/23 0127)    PRN Medications: sodium chloride, sodium chloride, acetaminophen, acetaminophen, albumin human, anticoagulant sodium citrate, artificial tears, dextrose, diphenhydrAMINE **OR** diphenhydrAMINE, HYDROmorphone, meperidine (DEMEROL) injection, midazolam, midazolam, morphine injection, mouth rinse, oxidized cellulose, oxyCODONE, sodium chloride flush    Assessment/Plan   1. Post-cardiotomy vasoplegia/shock with failure to wean from CPB: Now on central  VA ECMO (05/23/2023) with RA  drainage/ascending aorta return. Post-op echo with EF 55%, mild-moderate RV dysfunction. Unable to get images yesterday by TTE.  TEE on 9/16 showed EF 30%, moderate LVH, moderate RV dysfunction with mild enlargement, stable bioprosthetic aortic valve, repaired TV with mild TR, no endocarditis noted.  To OR for mediastinal washout on 9/19.  By TEE 9/19, EF 50% with moderate LVH, mild RV enlargement and mild RV dysfunction, stable bioprosthetic aortic valve, repaired TV with mild TR.  He did not tolerate lowering of ECMO flows even with increasing of pressors due to vasoplegia. CXR with clearing left lung, right lung with persistent airspace disease not clearing with CVVH. Switch from [place] ECMO to VV ECMO with chest closure on 05/21/2023 - Epi weaned to 3-62mcg. Continue to wean. Continue VP 0.04.  - Sedation now on hold. - CVP 8-10 today; continue to pull on CVVHD ~100cc/hr to maintain net negative; goal CVP 7.  - LDH stable. Continue bival. Discussed dosing with PharmD personally.  2. Acute hypoxic respiratory failure, post-op: On vent, rest settings. Blender added to keep PaO2 down. Sweep remains 4.5. CXR with some clearing of left lung, right lung with ongoing diffuse airspace disease without change.  Suspect extensive PNA, think we have done what we can with CVVH for lung clearing at this point. Bronchoscopy yesterday with no change in CXR.  - Now on VV ECMO  - R lung consolidation is severe. BAL from 9/20 + candida and mold - Meropenem/vancomycin/micafungin for coverage of PNA - CT chest with extensive infiltrates. - Will need trach.  - D/w CCM  3. Severe prosthetic AI: Due to previous MSSA endocarditis with partial valve dehiscence s/p re-do AVR/Bentall with reimplantation of SVG-RCA and TV repair 05/16/2023.   - Continuing meropenem/vancomycin/micafungin/amphotericin.  4. CAD s/p CABG/AVR 2017:  s/p AVR/Bentall and re-implantation of SVG-RCA 05/22/2023. - Off ASA with platelets  down.   5. ESRD: CVVH as above. Keep negative  6. DM2: management per CCM  7. Heart block: Post-op, now in stable junctional rhythm.    8. H/o CVA: Prior cerebellar CVA.   9. Thrombocytopenia: monitoring. Suspect inflammatory and hemolysis.   10. Acute blood loss anemia: Post-op, transfuse hgb < 8.   11. ID: H/o MSSA bacteremia.  WBCs 25K.  - Meropenem + vancomycin - Micafungin for fungal coverage added 9/20 - Started amphotericin 06/04/23; slight improvement in lung sounds.   12. FEN: Receiving Tfs  13. Liver failure, post-op  CRITICAL CARE Performed by: Dorthula Nettles   Total critical care time: 40 minutes  Critical care time was exclusive of separately billable procedures and treating other patients.  Critical care was necessary to treat or prevent imminent or life-threatening deterioration.  Critical care was time spent personally by me on the following activities: development of treatment plan with patient and/or surrogate as well as nursing, discussions with consultants, evaluation of patient's response to treatment, examination of patient, obtaining history from patient or surrogate, ordering and performing treatments and interventions, ordering and review of laboratory studies, ordering and review of radiographic studies, pulse oximetry and re-evaluation of patient's condition.   Length of Stay: [address], DO  06/05/2023, 9:23 AM  Advanced Heart Failure Team Pager [phone] (M-F; 7a - 5p)  Please contact [facility] for night-coverage after hours (5p -7a ) and weekends on [email or web address]

## 2023-06-05 NOTE — Progress Notes (Signed)
Patient ID: [name], male   DOB: Mar 28, 1961, 62 y.o.   MRN: [id number] S: remains on ecmo. Minimal epi requirements. Ongoing thrombocytopenia. CRRT no issues O:BP 134/68   Pulse 72   Temp (!) 97.3 F (36.3 C)   Resp (!) 33   Ht 5\' 6"  (1.676 m)   Wt 78.8 kg   SpO2 100%   BMI 28.04 kg/m   Intake/Output Summary (Last 24 hours) at 06/05/2023 1017 Last data filed at 06/05/2023 1000 Gross per 24 hour  Intake 4049.07 ml  Output 5539.1 ml  Net -1490.03 ml   Intake/Output: I/O last 3 completed shifts: In: 5836.2 [I.V.:3435.8; Other:175; IV Piggyback:2225.4] Out: 9652.1 [Emesis/NG output:25; Other:50; Stool:445; Chest Tube:18]  Intake/Output this shift:  Total I/O In: 328 [I.V.:278; Other:30; NG/GT:20] Out: 620.6 [Stool:35] Weight change: -1.7 kg RJJ:[id number] ill-appearing, intubated and sedated ZSW:FUXNAT rate, no audible rub Resp:coarse BS bilaterally, ventilated, bilateral chest rise Abd:+BS, soft Ext: no lee  Recent Labs  Lab 05/30/23 0418 05/30/23 0613 05/11/2023 0418 05/27/2023 0747 06/01/23 0424 06/01/23 0433 06/02/23 0403 06/02/23 0433 06/02/23 1553 06/02/23 1601 06/03/23 0405 06/03/23 0410 06/03/23 1606 06/03/23 1618 06/04/23 0413 06/04/23 0843 06/04/23 1604 06/04/23 1620 06/04/23 1732 06/04/23 1948 06/05/23 0346 06/05/23 0347 06/05/23 0751  NA 134*   < > 135   < > 137   < > 136  134*   < > 135   < > 133*   < > 136   < > 135  136   < > 135 136 135 134* 135  135 135 134*  K 3.7   < > 3.8   < > 4.2   < > 4.1  4.1   < > 4.6   < > 4.6   < > 4.6   < > 4.4  4.3   < > 4.7 4.7 4.5 4.4 4.2  4.2 4.2 4.4  CL 102   < > 101   < > 102   < > 96*  96*  --  97*  --  97*  --  102  --  101  --  99  --   --   --  99  98  --   --   CO2 22   < > 23   < > 19*   < > 27  32  --  24  --  27  --  25  --  23  --  23  --   --   --  22  23  --   --   GLUCOSE 118*   < > 172*   < > 204*   < > 202*  202*  --  226*  --  230*  --  215*  --  196*  --  228*  --   --   --  173*   171*  --   --   BUN 22   < > 14   < > 18   < > 23  23  --  23  --  24*  --  29*  --  29*  --  31*  --   --   --  34*  34*  --   --   CREATININE 1.13   < > 1.06   < > 1.19   < > 0.98  1.03  --  0.92  --  0.93  --  1.05  --  1.06  --  1.06  --   --   --  1.13  1.11  --   --   ALBUMIN 1.8*   < > 1.9*  1.9*   < > 2.0*  2.0*   < > 1.8*  1.8*  --  1.8*  --  1.9*  --  1.9*  --  2.0*  2.0*  --  2.0*  --   --   --  2.0*  2.0*  --   --   CALCIUM 8.3*   < > 8.7*   < > 8.1*   < > 8.0*  7.8*  --  7.7*  --  8.1*  --  7.9*  --  8.2*  --  8.3*  --   --   --  8.6*  8.6*  --   --   PHOS 2.0*   < > 2.8   < > 3.9   < > 2.2*  2.2*  --  4.3  --  3.2  --  3.2  --  3.4  --  3.9  --   --   --  3.1  --   --   AST 76*  --  69*  --  88*  --  112*  --   --   --  111*  --   --   --  108*  --   --   --   --   --  116*  --   --   ALT 9  --  9  --  12  --  10  --   --   --  13  --   --   --  16  --   --   --   --   --  17  --   --    < > = values in this interval not displayed.   Liver Function Tests: Recent Labs  Lab 06/03/23 0405 06/03/23 1606 06/04/23 0413 06/04/23 1604 06/05/23 0346  AST 111*  --  108*  --  116*  ALT 13  --  16  --  17  ALKPHOS 90  --  90  --  92  BILITOT 4.7*  --  4.7*  --  5.2*  PROT 6.4*  --  7.5  --  7.7  ALBUMIN 1.9*   < > 2.0*  2.0* 2.0* 2.0*  2.0*   < > = values in this interval not displayed.   No results for input(s): "LIPASE", "AMYLASE" in the last 168 hours. Recent Labs  Lab 06/03/23 0742 06/04/23 1229  AMMONIA 26 40*   CBC: Recent Labs  Lab 05/29/23 1722 05/30/23 0002 06/03/23 0405 06/03/23 0410 06/03/23 1606 06/03/23 1618 06/04/23 0413 06/04/23 0843 06/04/23 1604 06/04/23 1620 06/05/23 0346 06/05/23 0347 06/05/23 0751  WBC 49.2*   < > 44.4*  --  43.3*  --  41.8*  --  41.9*  --  44.5*  --   --   NEUTROABS 25.7*  --   --   --   --   --   --   --   --   --   --   --   --   HGB 7.9*   < > 8.4*   < > 8.3*   < > 8.6*  9.9*   < > 8.5*   < > 8.1* 8.8* 9.9*   HCT 24.5*   < > 24.9*   < > 25.2*   < > 26.1*  29.0*   < > 26.6*   < >  25.0* 26.0* 29.0*  MCV 90.7   < > 88.3  --  90.6  --  90.0  --  93.7  --  90.6  --   --   PLT 17*   < > 44*  --  31*  --  25*  --  26*  --  25*  --   --    < > = values in this interval not displayed.   Cardiac Enzymes: No results for input(s): "CKTOTAL", "CKMB", "CKMBINDEX", "TROPONINI" in the last 168 hours. CBG: Recent Labs  Lab 06/04/23 1617 06/04/23 1944 06/04/23 2310 06/05/23 0317 06/05/23 0749  GLUCAP 222* 260* 212* 158* 204*    Iron Studies: No results for input(s): "IRON", "TIBC", "TRANSFERRIN", "FERRITIN" in the last 72 hours. Studies/Results: DG CHEST PORT 1 VIEW  Result Date: 06/05/2023 CLINICAL DATA:  Postop aortic valve replacement and replacement of the ascending aortic root 2 weeks ago. ARDS on ECMO. EXAM: PORTABLE CHEST 1 VIEW COMPARISON:  Radiographs 06/04/2023 and 06/03/2023.  CT 06/03/2023. FINDINGS: 0556 hours. Two views submitted. Patient remains rotated to the left. Tip of the endotracheal tube is unchanged at the level of the mid trachea. Enteric tube is looped in the proximal stomach. Right internal jugular and left subclavian venous catheters, bilateral chest tubes and ECMO apparatus appear unchanged. The heart size and mediastinal contours are stable post median sternotomy. Right greater than left airspace opacities and small bilateral pleural effusions are unchanged. No evidence of pneumothorax. IMPRESSION: Stable examination. Unchanged position of the support system. No significant change in bilateral airspace opacities and small bilateral pleural effusions. No evidence of pneumothorax. Electronically Signed   By: Carey Bullocks M.D.   On: 06/05/2023 09:54   DG CHEST PORT 1 VIEW  Result Date: 06/04/2023 CLINICAL DATA:  Adult respiratory distress syndrome. EXAM: PORTABLE CHEST 1 VIEW COMPARISON:  Chest radiograph and chest CT dated 06/03/2023. FINDINGS: An endotracheal tube terminates in  the midthoracic trachea. An enteric tube reaches the distal esophagus and likely enters the stomach. Multiple mediastinal and thoracostomy tubes appear unchanged. An ECMO catheter appears unchanged. A right internal jugular central venous catheter appears unchanged. Moderate to severe right and moderate left airspace opacities have improved since prior exam. No significant pneumothorax. IMPRESSION: Improved bilateral airspace opacities. Electronically Signed   By: Romona Curls M.D.   On: 06/04/2023 10:28    artificial tears   Both Eyes Q8H   bisacodyl  10 mg Rectal Daily   Chlorhexidine Gluconate Cloth  6 each Topical Daily   dextrose  10 mL Intravenous Q24H   insulin aspart  0-20 Units Subcutaneous Q4H   insulin aspart  5 Units Subcutaneous Q4H   insulin detemir  16 Units Subcutaneous BID   mouth rinse  15 mL Mouth Rinse Q2H   pantoprazole (PROTONIX) IV  40 mg Intravenous QHS   sodium chloride  250 mL Intravenous Q24H   sodium chloride  250 mL Intravenous Q24H   sodium chloride flush  3 mL Intravenous Q12H    BMET    Component Value Date/Time   NA 134 (L) 06/05/2023 0751   NA 143 11/02/2022 1454   K 4.4 06/05/2023 0751   CL 98 06/05/2023 0346   CL 99 06/05/2023 0346   CO2 23 06/05/2023 0346   CO2 22 06/05/2023 0346   GLUCOSE 171 (H) 06/05/2023 0346   GLUCOSE 173 (H) 06/05/2023 0346   BUN 34 (H) 06/05/2023 0346   BUN 34 (H) 06/05/2023 0346   BUN 31 (H)  11/02/2022 1454   CREATININE 1.11 06/05/2023 0346   CREATININE 1.13 06/05/2023 0346   CREATININE 2.71 (H) Jun 14, 2023 0950   CREATININE 1.05 02/24/2015 1601   CALCIUM 8.6 (L) 06/05/2023 0346   CALCIUM 8.6 (L) 06/05/2023 0346   GFRNONAA >60 06/05/2023 0346   GFRNONAA >60 06/05/2023 0346   GFRNONAA 26 (L) 2023/06/14 0950   GFRNONAA 80 02/24/2015 1601   GFRAA 62 10/09/2020 1520   GFRAA >89 02/24/2015 1601   CBC    Component Value Date/Time   WBC 44.5 (H) 06/05/2023 0346   RBC 2.76 (L) 06/05/2023 0346   HGB 9.9 (L)  06/05/2023 0751   HGB 6.4 (LL) 2023/06/14 0852   HGB 10.2 (L) 08/12/2022 0934   HCT 29.0 (L) 06/05/2023 0751   HCT 29.2 (L) 08/12/2022 0934   PLT 25 (LL) 06/05/2023 0346   PLT 109 (L) June 14, 2023 0852   PLT 252 08/12/2022 0934   MCV 90.6 06/05/2023 0346   MCV 94 08/12/2022 0934   MCH 29.3 06/05/2023 0346   MCHC 32.4 06/05/2023 0346   RDW 17.6 (H) 06/05/2023 0346   RDW 15.5 (H) 08/12/2022 0934   LYMPHSABS 5.8 (H) 05/29/2023 1722   LYMPHSABS 1.9 11/04/2021 1028   MONOABS 6.3 (H) 05/29/2023 1722   EOSABS 0.0 05/29/2023 1722   EOSABS 0.2 11/04/2021 1028   BASOSABS 0.0 05/29/2023 1722   BASOSABS 0.1 11/04/2021 1028    Assessment/Plan:   Anuric AKI/dialysis dependent AKI with fluid overload: Started on RRT during previous admission in May 2024 until 05/10/23 (recovered). Patient is status post cardiac surgery on 9/13 and currently on ECMO. CRRT start 9/11. No signs of renal recovery.  UF as tolerated by BP and ECMO, Angiomax for anticoagulation.  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  Per CTS Shock: multfactoria on pressors and ecmo. Ongoing infectious concerns followed by ID AHRF: VDRF, per primary service. Abx/antifungal per primary service Postcardiotomy vasoplegia/shock with failure to wean: Currently on ECMO per HF team.  On pressors per primary service but requirements decreasing.  Volume managing with CRRT. S/p washout 9/19. Started on midodrine. Anemia, thrombocytopenia: Transfuse as needed per primary team. Hypophosphatemia - repletion prn

## 2023-06-05 NOTE — Progress Notes (Signed)
PHARMACY - TOTAL PARENTERAL NUTRITION CONSULT NOTE   Indication:  unable to tolerate tube feeding  Patient Measurements: Height: 5\' 6"  (167.6 cm) Weight: 78.8 kg (173 lb 11.6 oz) IBW/kg (Calculated) : 63.8 TPN AdjBW (KG): 69 Body mass index is 28.04 kg/m. Usual Weight: ~90kg  Assessment: 62 yo male with significant PMH for HLD, HTN, T2DM, CKD4 (prev on HD) and recent MSSA bacteremia + endocarditis (May 2024). Back in May 2024 admission, underwent ERCP for cholangitis/pancreatitis. Admitted on 9/11 w/ chest pain found to have aortic valve dehiscence. S/p aortic valve replacement redo on 9/13 - developed pulmonary edema post op and was placed on [place] ECMO, transitioned to VV ECMO 9/24. S/p mediastinal washout on 9/19 - chest still open. Unable to speak w/ patient due to intubation (utilizing Precedex for sedation). Pharmacy consulted to manage TPN for inability to tolerate tube feeding and malnutrition.   Glucose / Insulin: hx T2DM - A1c 6.1%, on Farxiga PTA. Previously on insulin gtt post CT surgery >> transitioned to rSSI q4h. 47 units SSI used/24 hours. CBGs 150-260s. Detemir increased to 12u q12h 9/28. Electrolytes: all WNL Renal: Hx CKD4 currently on CRRT (required HD in the past), using 4K bags, systemic bivalirudin for anticoag - not using systemic sodium citrate infusion, only for catheter lock per d/w RN at bedside on 9/24 CRRT 9/11 >>  Hepatic: Alk phos WNL, AST 116- trending down, ALT 17 - rising slightly, Tbili up to 5.2, albumin 2, TG 122 (9/23) Intake / Output:  - OG output 0mL - Stool output 215 mL, receiving Reglan 5mg  IV q6h - Chest tube output 18mL - CRRT removed - NGT placed 9/22, Cortrak placed 9/20  MIVF: NS @ K[facility] GI Imaging:  - CT A/P 9/27: mild wall thickening in several small bowel loops suggesting enteritis, cholelithiasis GI Surgeries / Procedures:  - 9/24 transition to VV ECMO  Central access: 9/13 TPN start date: 9/23  Nutritional Goals: Goal  concentrated TPN rate is 75 mL/hr (provides 131 g of protein and 2250 kcals per day)  RD Assessment: Estimated Needs Total Energy Estimated Needs: 2200-2400 Total Protein Estimated Needs: 130-150 grams Total Fluid Estimated Needs: 1L plus UOP  Current Nutrition:  NPO and TPN  Plan:  Continue concentrated TPN at 75 mL/hr at 1800 - will provide 2250 kcal and 131 g protein meeting ~100% of needs Electrolytes in TPN: Na increased to 74mEq/L, K 79mEq/L, Ca 66mEq/L, Mg 72mEq/L, and Phos 20 mmol/L. Max chloride (still getting some bicarb) (TPN compounder unable to formulate other Cl:Ac ratios with current formula) Standard MVI and trace elements within TPN No chromium (on CRRT and currently on shortage) Continue Resistant q4h SSI  Continue detemir 12 units SQ BID and aspart 3u q4h added in addition to SSI per discussion with team - can consider adding to TPN bag once needs determined.  Continue MIVF at [facility] Monitor TPN labs on Mon/Thurs, RFP BID while on CRRT   Wilburn Cornelia, PharmD, BCPS Clinical Pharmacist 06/05/2023 9:01 AM   Please refer to [facility] for pharmacy phone number

## 2023-06-05 NOTE — Progress Notes (Signed)
NAME:  Youness Lawes, MRN:  [id number], DOB:  1961-01-22, LOS: 18 ADMISSION DATE:  05/30/2023, CONSULTATION DATE:  9/11 REFERRING MD:  Dr. Izora Ribas, CHIEF COMPLAINT:  aortic valve dehiscence   History of Present Illness:  Patient is [name] w/ pertinent PMH CAD s/p CABG 2017 w/ AVR, HLD, HTN, prior CVA, T2DM, CKD4 was previously on HD presents to [facility] on 9/10 chest pain.  Patient recently admitted to [facility] on 5/25 w/ AKI and AMS. While in ED patient had PEA arrest w/ ROSC in about 8 minutes. Patient required dialysis on this admission. Also w/ MSSA bacteremia associated w/ tricuspid valve endocarditis. Patient transferred to [facility] on 5/25. Treated w/ 6 week course oxacillin and rifampin. This admission also suffered a R cerebellar CVA w/ MRI likely septic emboli and had cholangitis/pancreatitis requiring ERCP.  On 9/10 patient admitted to [facility] w/ chest pain, dyspnea, and BLE edema. BNP 2,097. CXR w/ pulm edema. Patient started on IV lasix infusion. Cards consulted. On 9/11 patient transferred to [facility]. Patient's echo showing possible aortic valve dehiscence. Cultures repeated and started on rocephin. Patient transferred to ICU for TEE and general anesthesia and to start CRRT. PCCM consulted.  Pertinent  Medical History   Past Medical History:  Diagnosis Date   Allergy    Anemia    Anxiety    Baker's cyst of knee    Blood transfusion without reported diagnosis    as baby    Coronary artery disease    quadruple bypass - March 2016   GERD (gastroesophageal reflux disease)    Gouty arthritis    "real bad" (01/17/2013)   Heart murmur    Hypercholesteremia    Hypertension    MSSA bacteremia 01/29/2023   Myocardial infarction (HCC) 2017   PEA (Pulseless electrical activity) (HCC) 01/29/2023   Stroke (HCC)    Type II diabetes mellitus (HCC)     Significant Hospital Events: Including procedures, antibiotic start and stop dates in addition to other pertinent events    9/10 admitted 9/11 echo showing aortic valve dehiscence, confirmed by TEE.  9/11 started (back) on CRRT for volume overload and known CKD 4 9/12 breathing better after CRRT 05/27/2023 to OR for Redo of aortic valve, tricuspid valve repair and ascending aortic root replacement w/ re-implantation of SVG to RCA. Post op TEE EF 55% RV mid-mod HK, AVR/TV repair stable. Could not come off CPB. Worse pulmonary edema. High dose pressors. Cannulated and started on [place] ECMO w/ central cannulation  9/16 MDT ECMO rounds this AM, plans to remove more volume, 1 U PRBCs  9/18 bronch, clot and mucus plugs removed  9/19 OR for washout  9/20 bronch 9/21 bronch 9/24 VA to VV, some bleeding and acidemia issues postop 9/25 ett exchange and bronch  Interim History / Subjective:  Overnight no acute events.   Objective   Blood pressure 134/68, pulse 70, temperature (!) 97.3 F (36.3 C), resp. rate (!) 33, height 5\' 6"  (1.676 m), weight 78.8 kg, SpO2 100%. CVP:  [8 mmHg-14 mmHg] 8 mmHg  Vent Mode: PCV FiO2 (%):  [40 %] 40 % Set Rate:  [24 bmp] 24 bmp Vt Set:  [400 mL] 400 mL PEEP:  [10 cmH20] 10 cmH20 Plateau Pressure:  [26 cmH20-27 cmH20] 27 cmH20   Intake/Output Summary (Last 24 hours) at 06/05/2023 0926 Last data filed at 06/05/2023 0900 Gross per 24 hour  Intake 4123.39 ml  Output 5578.7 ml  Net -1455.31 ml  Filed Weights   06/03/23 0630 06/04/23 0500 06/05/23 0500  Weight: 82.6 kg 80.5 kg 78.8 kg    Examination: Critically ill appearing man lying in bed in NAD /AT, eyes anicteric. Continues to have conjunctival injection. ETT in place.  S1S2, RRR Rhales bilaterally, improved breath sounds on the R today.  Abd soft, NT. Bilious NG output.  No significant LE edema. Ischemic digital digits- similar to previous exams.  Skin warm, dry. No diffuse rashes.  RASS -5, mild wincing response to nailbed pressure. Breathes over the vent. Still has small pupils.   7.42/38/159/25 BUN 34, CR 1.13 on  CRRT AST 116 ALT17 T bili 5.2 WBC 44.5 H/H 8.1/25 Platelets 25 Fibrinogen LDH 979, downtrending   Cryptococcal antigen negative Blasto antigen pending 9/25 resp culture: few mold> speciation pending (send out to [facility]) 9/20 BAL fungal culture: fungal elements positive; Candida albicans.  CXR personally reviewed- ETT ~2cm above carina. RIJ TDC, RIJ ECMO cannula. Similar bilateral R>L infiltrates.    Assessment & Plan:   Post cardiotomy vasoplegia & cardioplegia w/ failure to wean from CPB- improved; 9/24 converted to VV [facility] for refractory hypoxia. Acute on chronic combined HF, Hx MSSA endocarditis- S/p AVR/[place] and reimplantation of coronaries and TV repair 9/13 CAD s/p CABG in 2017 w/ AVR Junctional rhythm-stable without pacing -zetia, aspirin, statin on hold since not able to takes orals -tele monitoring -con't epi to maintain MAP >65, SBP >100  Acute respiratory failure w/ hypoxia - multifactorial; initially had hypervolemia.  Bilateral pneumonia & ARDS- appears to be fungal pneumonia. Main risk factors are prolonged critical illness, previous sepsis in the past 6 months.  History of untreated OSA -Change antifungal to isavuconazole to broaden coverage> ordered, but won't be here until tomorrow. Amphotericin started yesterday after multidisciplinary discussion.  -Con't following cultures. Sendout cultures to [facility] pending. -Con't VV ECMO support; on sweep 7L. Titrate to pH 7.35-7.45.  -Con't LTVV, remains on PS 18 with Vt ~250cc, PEEP 10, FiO2 40%  CKD 4. Had recently been on HD- now on CRRT Circuit related hemolysis, thrombocytopenia consumptive- stable -CRRT per nephrology; pulling 50cc/h. As he gets closer to euvolemia we may run into CRRT & circuit flow issues with both cannulas in RIJ -strict I/O -renally dose meds, avoid nephrotoxic medications  TF intolerance- ongoing issue now on TPN -con't NGT to LIWS to prevent aspiration -TPN -Holding reglan due to  Qtc concerns with antifungals. Unfortunately erythromycin is also likely to cause Qtc issues.    Acute encephalopathy, concern for metabolic cause from heavy sedation, but not waking up off sedation for several days.  No acute findings on head CT 9/27. Likely has sequestered versed into circuit and deposited on adipose tissue, prolonging clearance time.  Hx of CVA Mild hyperammoniemia-- does not fully explain degree of encephalopathy -unable to take lactulose currently with ileus; can consider rectal administration if ammonia rises -con't to hold sedation  HTN HLD -holding zetia and antihypertensive medications  DMT2; uncontrolled -con't TPN coverage with aspart q5h-- increasing to 5 units with hold parameters -con't semglee 12 units BID -SSI PEN -goal BG 140-180 -need to know if insulin is added to TPN-- would stop or decrease the Q4h aspart coverage  Leukocytosis, multifactorial  -con't to monitor  Anemia due to critical illness, operative blood loss Thrombocytopenia -transfuse for Hb <7.5 or hemodynamically significant bleeding -con't bival  Hyperbilirubinemia-- most likely liver dysfunction from acute illness. LFTs are stable but mildly elevated. Has a history of cholelithiasis requiring ERCP and stenting  in 03/2023 -monitor -may need RUQ [place] if bilirubin continues to rise; he is at risk of acalculous cholecystitis -check direct bili tomorrow with LFTs    Best Practice (right click and "Reselect all SmartList Selections" daily)   Diet/type: TPN DVT prophylaxis: bival GI prophylaxis: PPI Lines: Central line and Dialysis Catheter Foley: yes Code Status:  full code Last date of multidisciplinary goals of care discussion [per TCTS and AHF]   This patient is critically ill with multiple organ system failure which requires frequent high complexity decision making, assessment, support, evaluation, and titration of therapies. This was completed through the application of  advanced monitoring technologies and extensive interpretation of multiple databases. During this encounter critical care time was devoted to patient care services described in this note for 45 minutes.  Steffanie Dunn, DO 06/05/23 10:17 AM [facility]  For contact information, see Amion. If no response to pager, please call PCCM consult pager. After hours, 7PM- 7AM, please call Elink.

## 2023-06-05 NOTE — Progress Notes (Signed)
Patient ID: Jatavian Calica, male   DOB: 05-24-1961, 62 y.o.   MRN: [id number]   Extracorporeal support note   ECLS support day: 6 Indication: Acute hypoxic respiratory failure  Configuration: Central VV ECMO  30FR Dual-lumen Crescent cannula in RIJ   Pump speed: 3400 rpm  Pump flow: 3.4 L/min Pump used: Cardiohelp  Sweep gas: 7  ABG: 7.4/38/159 LDH: 979  Circuit check: Small clot at 12p Anticoagulant: On bival Anticoagulation targets:  PTT 50-60  Changes in support: none  Anticipated goals/duration of support: Wean VV ECMO as able; has significant pulmonary infiltrates. Started on amphotericin yesterday.   Eliezer Lofts Chimaobi Casebolt DO  06/05/2023, 9:22 AM

## 2023-06-05 NOTE — Anesthesia Postprocedure Evaluation (Signed)
Anesthesia Post Note  Patient: [name]  Procedure(s) Performed: CONVERSION TO VV ECMO (EXTRACORPOREAL MEMBRANE OXYGENATION) DECANNULATION FOR ECMO TRANSESOPHAGEAL ECHOCARDIOGRAM     Patient location during evaluation: SICU Anesthesia Type: General Level of consciousness: sedated, patient remains intubated per anesthesia plan and obtunded/minimal responses Pain management: pain level controlled Vital Signs Assessment: vitals unstable Respiratory status: patient remains intubated per anesthesia plan and patient on ventilator - see flowsheet for VS (ECMO dependent, probable PNA) Cardiovascular status: unstable Postop Assessment: no apparent nausea or vomiting Anesthetic complications: no Comments: Pt remains critically ill, with little change, needing circulatory and ventilatory support   No notable events documented.  Last Vitals:  Vitals:   06/05/23 0900 06/05/23 1000  BP:    Pulse: 70 72  Resp: (!) 33 (!) 33  Temp: (!) 36.3 C (!) 36.3 C  SpO2: 100% 100%    Last Pain:  Vitals:   06/05/23 0800  TempSrc: Esophageal  PainSc:                  Shelda Truby,E. Rhenda [place]

## 2023-06-05 NOTE — Progress Notes (Signed)
5 Days Post-Op Procedure(s) (LRB): CONVERSION TO VV ECMO (EXTRACORPOREAL MEMBRANE OXYGENATION) (N/A) DECANNULATION FOR ECMO (N/A) TRANSESOPHAGEAL ECHOCARDIOGRAM (N/A) Subjective: Intubated, minimal response to painful stimuli  Objective: Vital signs in last 24 hours: Temp:  [97.2 F (36.2 C)-99.6 F (37.6 C)] 97.3 F (36.3 C) (09/29 0900) Pulse Rate:  [61-75] 70 (09/29 0900) Cardiac Rhythm: Junctional rhythm;Bundle branch block (09/29 0800) Resp:  [17-36] 33 (09/29 0900) BP: (134)/(68) 134/68 (09/29 0803) SpO2:  [99 %-100 %] 100 % (09/29 0900) Arterial Line BP: (105-135)/(45-61) 114/52 (09/29 0900) FiO2 (%):  [40 %] 40 % (09/29 0803) Weight:  [78.8 kg] 78.8 kg (09/29 0500)  Hemodynamic parameters for last 24 hours: CVP:  [8 mmHg-14 mmHg] 8 mmHg  Intake/Output from previous day: 09/28 0701 - 09/29 0700 In: 4106.1 [I.V.:2235.9; IV Piggyback:1810.2] Out: 5715.1 [Stool:215; Chest Tube:18] Intake/Output this shift: Total I/O In: 230.3 [I.V.:180.3; Other:30; NG/GT:20] Out: 443 [Stool:35]  General appearance: toxic Neurologic: minimally responsive Lungs: some BS on R Abdomen: less distended  Lab Results: Recent Labs    06/04/23 1604 06/04/23 1620 06/05/23 0346 06/05/23 0347 06/05/23 0751  WBC 41.9*  --  44.5*  --   --   HGB 8.5*   < > 8.1* 8.8* 9.9*  HCT 26.6*   < > 25.0* 26.0* 29.0*  PLT 26*  --  25*  --   --    < > = values in this interval not displayed.   BMET:  Recent Labs    06/04/23 1604 06/04/23 1620 06/05/23 0346 06/05/23 0347 06/05/23 0751  NA 135   < > 135  135 135 134*  K 4.7   < > 4.2  4.2 4.2 4.4  CL 99  --  99  98  --   --   CO2 23  --  22  23  --   --   GLUCOSE 228*  --  173*  171*  --   --   BUN 31*  --  34*  34*  --   --   CREATININE 1.06  --  1.13  1.11  --   --   CALCIUM 8.3*  --  8.6*  8.6*  --   --    < > = values in this interval not displayed.    PT/INR:  Recent Labs    06/05/23 0346  LABPROT 17.6*  INR 1.4*    ABG    Component Value Date/Time   PHART 7.415 06/05/2023 0751   HCO3 24.8 06/05/2023 0751   TCO2 26 06/05/2023 0751   ACIDBASEDEF 3.0 (H) 06/04/2023 1948   O2SAT 99 06/05/2023 0751   CBG (last 3)  Recent Labs    06/04/23 2310 06/05/23 0317 06/05/23 0749  GLUCAP 212* 158* 204*    Assessment/Plan: S/P Procedure(s) (LRB): CONVERSION TO VV ECMO (EXTRACORPOREAL MEMBRANE OXYGENATION) (N/A) DECANNULATION FOR ECMO (N/A) TRANSESOPHAGEAL ECHOCARDIOGRAM (N/A) Remains critically ill NEURO- minimal response to apinful stimuli  Sedation off  CT with no acute findings CV- junctional rhythm  On epi at 3 RESP- VDRF on VV ECMO, vent  CXR possibly slightly improved on right ID- WBC remains elevated, up slightly to 44k  On Vanco/ meropenem completed  On Ancef  Amphotericin started yesterday, on micafungin Gi- less distended  On TNA Discussed on rounds with CCM and AHF   LOS: 18 days    Loreli Slot 06/05/2023

## 2023-06-05 NOTE — Progress Notes (Signed)
ANTICOAGULATION CONSULT NOTE - Follow up Consult  Pharmacy Consult for bivalirudin Indication:  VV ECMO  Allergies  Allergen Reactions   Other Anaphylaxis    Mushrooms  Not listed on MAR    Plavix [Clopidogrel Bisulfate] Other (See Comments)    TTP Not listed on the [facility]   Fleet Enema [Enema] Other (See Comments)    Unknown reaction   Lovenox [Enoxaparin] Other (See Comments)    Unknown reaction   Morphine Other (See Comments)    Unknown reaction   Nsaids Other (See Comments)    Unknown reaction   Hydrocodone-Acetaminophen Itching    Patient Measurements: Height: 5\' 6"  (167.6 cm) Weight: 78.8 kg (173 lb 11.6 oz) IBW/kg (Calculated) : 63.8 Heparin Dosing Weight: 81.3 kg  Vital Signs: Temp: 97.3 F (36.3 C) (09/29 1815) Temp Source: Esophageal (09/29 1600) BP: 116/55 (09/29 1429) Pulse Rate: 73 (09/29 1815)  Labs: Recent Labs    06/03/23 0405 06/03/23 0410 06/04/23 0413 06/04/23 0843 06/04/23 1604 06/04/23 1620 06/05/23 0346 06/05/23 0347 06/05/23 0751 06/05/23 1621 06/05/23 1631  HGB 8.4*   < > 8.6*  9.9*   < > 8.5*   < > 8.1*   < > 9.9* 8.3* 9.5*  HCT 24.9*   < > 26.1*  29.0*   < > 26.6*   < > 25.0*   < > 29.0* 25.2* 28.0*  PLT 44*   < > 25*  --  26*  --  25*  --   --  28*  --   APTT 50*   < > 48*  --  47*  --  45*  --   --  46*  --   LABPROT 17.6*  --  16.2*  --   --   --  17.6*  --   --   --   --   INR 1.4*  --  1.3*  --   --   --  1.4*  --   --   --   --   CREATININE 0.93   < > 1.06  --  1.06  --  1.13  1.11  --   --  1.11  --    < > = values in this interval not displayed.    Estimated Creatinine Clearance: 67.2 mL/min (by C-G formula based on SCr of 1.11 mg/dL).   Assessment: 62 yom underwent Bentall/AVR, TV repair, re-implantation of SVG-RCA complicated with vasoplegia and pulmonary edema requiring central VA ECMO. On 06-03-2023 patient underwent transition VA to VV ECMO.   Hgb stable at 8.8 and Plt down to 25. No clots in circuit. No fibrin  streaks. LDH and fibrinogen both elevated but stable.  aPTT this morning is subtherapeutic at 45 sec on bivalirudin 0.005 mg/kg/hour.   9/29 PM update: aPTT 46 sec remains subtherapeutic after rate increase to 0.006 mg/kg/hr.   Goal of Therapy:  aPTT 50-70 seconds Monitor platelets by anticoagulation protocol: Yes   Plan:  Increase bivalirudin at 0.007 mg/kg/hr  Check aPTT at 0500 Monitor q12 hr aPTT and CBC, LDH, fibrinogen and for s/sx of bleeding.  Trixie Rude, PharmD Clinical Pharmacist 06/05/2023  6:18 PM

## 2023-06-05 NOTE — Progress Notes (Signed)
ANTICOAGULATION CONSULT NOTE - Follow up Consult  Pharmacy Consult for bivalirudin Indication:  VV ECMO  Allergies  Allergen Reactions   Other Anaphylaxis    Mushrooms  Not listed on MAR    Plavix [Clopidogrel Bisulfate] Other (See Comments)    TTP Not listed on the [facility]   Fleet Enema [Enema] Other (See Comments)    Unknown reaction   Lovenox [Enoxaparin] Other (See Comments)    Unknown reaction   Morphine Other (See Comments)    Unknown reaction   Nsaids Other (See Comments)    Unknown reaction   Hydrocodone-Acetaminophen Itching    Patient Measurements: Height: 5\' 6"  (167.6 cm) Weight: 78.8 kg (173 lb 11.6 oz) IBW/kg (Calculated) : 63.8 Heparin Dosing Weight: 81.3 kg  Vital Signs: Temp: 97.7 F (36.5 C) (09/29 0700) Temp Source: Esophageal (09/29 0400) Pulse Rate: 71 (09/29 0700)  Labs: Recent Labs    06/03/23 0405 06/03/23 0410 06/04/23 0413 06/04/23 0843 06/04/23 1604 06/04/23 1620 06/04/23 1948 06/05/23 0346 06/05/23 0347  HGB 8.4*   < > 8.6*  9.9*   < > 8.5*   < > 9.2* 8.1* 8.8*  HCT 24.9*   < > 26.1*  29.0*   < > 26.6*   < > 27.0* 25.0* 26.0*  PLT 44*   < > 25*  --  26*  --   --  25*  --   APTT 50*   < > 48*  --  47*  --   --  45*  --   LABPROT 17.6*  --  16.2*  --   --   --   --  17.6*  --   INR 1.4*  --  1.3*  --   --   --   --  1.4*  --   CREATININE 0.93   < > 1.06  --  1.06  --   --  1.13  1.11  --    < > = values in this interval not displayed.    Estimated Creatinine Clearance: 67.2 mL/min (by C-G formula based on SCr of 1.11 mg/dL).   Assessment: 62 yom underwent Bentall/AVR, TV repair, re-implantation of SVG-RCA complicated with vasoplegia and pulmonary edema requiring central VA ECMO. On 9/24 patient underwent transition VA to VV ECMO.   Hgb stable at 8.8 and Plt down to 25. No clots in circuit. No fibrin streaks. LDH and fibrinogen both elevated but stable.  aPTT this morning is subtherapeutic at 45 sec on bivalirudin 0.005  mg/kg/hour.    Goal of Therapy:  aPTT 50-70 seconds Monitor platelets by anticoagulation protocol: Yes   Plan:  Increase bivalirudin at 0.006 mg/kg/hr  Check aPTT at 1700 Monitor q12 hr aPTT and CBC, LDH, fibrinogen and for s/sx of bleeding.  Wilmer Floor, PharmD PGY2 Cardiology Pharmacy Resident 06/05/2023  7:29 AM

## 2023-06-05 NOTE — Progress Notes (Signed)
      [address]       [place] [address]             [phone]      No change in mental status  BP (!) 116/55   Pulse 73   Temp (!) 97.3 F (36.3 C)   Resp (!) 37   Ht 5\' 6"  (1.676 m)   Wt 78.8 kg   SpO2 100%   BMI 28.04 kg/m  CVP 8 on epi @ 5  Intake/Output Summary (Last 24 hours) at 06/05/2023 1835 Last data filed at 06/05/2023 1800 Gross per 24 hour  Intake 4048.26 ml  Output 5979.3 ml  Net -1931.04 ml   Continue current Rx  Gustava Berland C. Dorris Fetch, MD [facility] [phone]

## 2023-06-05 NOTE — Progress Notes (Signed)
eLink Physician-Brief Progress Note Patient Name: [name] DOB: 30-Jul-1961 MRN: [id number]   Date of Service  06/05/2023  HPI/Events of Note  Bedside RN reporting pain with turns and bathing.  Patient is encephalopathic and has been off all sedating medications he was assessed his mental state.  eICU Interventions  With his bathing over and him not currently being turned, he appears comfortable.  Discussed this in detail with bedside nurse.  No indication at the moment for pain medication.  Will continue to monitor him off all sedating medications.     Intervention Category Intermediate Interventions: Pain - evaluation and management  Carilyn Goodpasture 06/05/2023, 5:44 AM

## 2023-06-06 ENCOUNTER — Inpatient Hospital Stay (HOSPITAL_COMMUNITY): Payer: PPO

## 2023-06-06 DIAGNOSIS — J181 Lobar pneumonia, unspecified organism: Secondary | ICD-10-CM | POA: Diagnosis not present

## 2023-06-06 DIAGNOSIS — J9602 Acute respiratory failure with hypercapnia: Secondary | ICD-10-CM

## 2023-06-06 DIAGNOSIS — I509 Heart failure, unspecified: Secondary | ICD-10-CM | POA: Diagnosis not present

## 2023-06-06 DIAGNOSIS — J189 Pneumonia, unspecified organism: Secondary | ICD-10-CM | POA: Diagnosis not present

## 2023-06-06 DIAGNOSIS — Z9911 Dependence on respirator [ventilator] status: Secondary | ICD-10-CM | POA: Diagnosis not present

## 2023-06-06 DIAGNOSIS — R570 Cardiogenic shock: Secondary | ICD-10-CM | POA: Diagnosis not present

## 2023-06-06 LAB — POCT I-STAT 7, (LYTES, BLD GAS, ICA,H+H)
Acid-Base Excess: 0 mmol/L (ref 0.0–2.0)
Acid-base deficit: 1 mmol/L (ref 0.0–2.0)
Acid-base deficit: 2 mmol/L (ref 0.0–2.0)
Acid-base deficit: 3 mmol/L — ABNORMAL HIGH (ref 0.0–2.0)
Acid-base deficit: 2 mmol/L (ref 0.0–2.0)
Bicarbonate: 21.8 mmol/L (ref 20.0–28.0)
Bicarbonate: 22.4 mmol/L (ref 20.0–28.0)
Bicarbonate: 23.1 mmol/L (ref 20.0–28.0)
Bicarbonate: 23.8 mmol/L (ref 20.0–28.0)
Bicarbonate: 24.8 mmol/L (ref 20.0–28.0)
Calcium, Ion: 1.13 mmol/L — ABNORMAL LOW (ref 1.15–1.40)
Calcium, Ion: 1.14 mmol/L — ABNORMAL LOW (ref 1.15–1.40)
Calcium, Ion: 1.17 mmol/L (ref 1.15–1.40)
Calcium, Ion: 1.18 mmol/L (ref 1.15–1.40)
Calcium, Ion: 1.19 mmol/L (ref 1.15–1.40)
HCT: 25 % — ABNORMAL LOW (ref 39.0–52.0)
HCT: 25 % — ABNORMAL LOW (ref 39.0–52.0)
HCT: 26 % — ABNORMAL LOW (ref 39.0–52.0)
HCT: 27 % — ABNORMAL LOW (ref 39.0–52.0)
HCT: 28 % — ABNORMAL LOW (ref 39.0–52.0)
Hemoglobin: 8.5 g/dL — ABNORMAL LOW (ref 13.0–17.0)
Hemoglobin: 8.5 g/dL — ABNORMAL LOW (ref 13.0–17.0)
Hemoglobin: 8.8 g/dL — ABNORMAL LOW (ref 13.0–17.0)
Hemoglobin: 9.2 g/dL — ABNORMAL LOW (ref 13.0–17.0)
Hemoglobin: 9.5 g/dL — ABNORMAL LOW (ref 13.0–17.0)
O2 Saturation: 100 %
O2 Saturation: 100 %
O2 Saturation: 100 %
O2 Saturation: 99 %
O2 Saturation: 99 %
Patient temperature: 36.5
Patient temperature: 36.4
Patient temperature: 36.5
Patient temperature: 36
Potassium: 3.8 mmol/L (ref 3.5–5.1)
Potassium: 3.9 mmol/L (ref 3.5–5.1)
Potassium: 3.9 mmol/L (ref 3.5–5.1)
Potassium: 4 mmol/L (ref 3.5–5.1)
Potassium: 4.1 mmol/L (ref 3.5–5.1)
Sodium: 135 mmol/L (ref 135–145)
Sodium: 136 mmol/L (ref 135–145)
Sodium: 136 mmol/L (ref 135–145)
Sodium: 137 mmol/L (ref 135–145)
Sodium: 138 mmol/L (ref 135–145)
TCO2: 23 mmol/L (ref 22–32)
TCO2: 23 mmol/L (ref 22–32)
TCO2: 24 mmol/L (ref 22–32)
TCO2: 25 mmol/L (ref 22–32)
TCO2: 26 mmol/L (ref 22–32)
pCO2 arterial: 34.3 mm[Hg] (ref 32–48)
pCO2 arterial: 36.1 mm[Hg] (ref 32–48)
pCO2 arterial: 38.2 mm[Hg] (ref 32–48)
pCO2 arterial: 39.3 mm[Hg] (ref 32–48)
pCO2 arterial: 41.3 mm[Hg] (ref 32–48)
pH, Arterial: 7.373 (ref 7.35–7.45)
pH, Arterial: 7.386 (ref 7.35–7.45)
pH, Arterial: 7.388 (ref 7.35–7.45)
pH, Arterial: 7.401 (ref 7.35–7.45)
pH, Arterial: 7.421 (ref 7.35–7.45)
pO2, Arterial: 148 mm[Hg] — ABNORMAL HIGH (ref 83–108)
pO2, Arterial: 155 mm[Hg] — ABNORMAL HIGH (ref 83–108)
pO2, Arterial: 177 mm[Hg] — ABNORMAL HIGH (ref 83–108)
pO2, Arterial: 178 mm[Hg] — ABNORMAL HIGH (ref 83–108)
pO2, Arterial: 178 mm[Hg] — ABNORMAL HIGH (ref 83–108)

## 2023-06-06 LAB — CBC
HCT: 25.2 % — ABNORMAL LOW (ref 39.0–52.0)
HCT: 24.2 % — ABNORMAL LOW (ref 39.0–52.0)
Hemoglobin: 8.2 g/dL — ABNORMAL LOW (ref 13.0–17.0)
Hemoglobin: 8.1 g/dL — ABNORMAL LOW (ref 13.0–17.0)
MCH: 29.9 pg (ref 26.0–34.0)
MCH: 29.8 pg (ref 26.0–34.0)
MCHC: 32.5 g/dL (ref 30.0–36.0)
MCHC: 33.5 g/dL (ref 30.0–36.0)
MCV: 92 fL (ref 80.0–100.0)
MCV: 89 fL (ref 80.0–100.0)
Platelets: 27 10*3/uL — CL (ref 150–400)
Platelets: 29 10*3/uL — CL (ref 150–400)
RBC: 2.74 MIL/uL — ABNORMAL LOW (ref 4.22–5.81)
RBC: 2.72 MIL/uL — ABNORMAL LOW (ref 4.22–5.81)
RDW: 17.6 % — ABNORMAL HIGH (ref 11.5–15.5)
RDW: 17.4 % — ABNORMAL HIGH (ref 11.5–15.5)
WBC: 46.5 10*3/uL — ABNORMAL HIGH (ref 4.0–10.5)
WBC: 45.4 10*3/uL — ABNORMAL HIGH (ref 4.0–10.5)
nRBC: 1.1 % — ABNORMAL HIGH (ref 0.0–0.2)
nRBC: 1.1 % — ABNORMAL HIGH (ref 0.0–0.2)

## 2023-06-06 LAB — RENAL FUNCTION PANEL
Albumin: 2 g/dL — ABNORMAL LOW (ref 3.5–5.0)
Anion gap: 12 (ref 5–15)
BUN: 42 mg/dL — ABNORMAL HIGH (ref 8–23)
CO2: 20 mmol/L — ABNORMAL LOW (ref 22–32)
Calcium: 8.2 mg/dL — ABNORMAL LOW (ref 8.9–10.3)
Chloride: 101 mmol/L (ref 98–111)
Creatinine, Ser: 1.27 mg/dL — ABNORMAL HIGH (ref 0.61–1.24)
GFR, Estimated: >60 mL/min (ref >60)
Glucose, Bld: 191 mg/dL — ABNORMAL HIGH (ref 70–99)
Phosphorus: 3.4 mg/dL (ref 2.5–4.6)
Potassium: 4.1 mmol/L (ref 3.5–5.1)
Sodium: 133 mmol/L — ABNORMAL LOW (ref 135–145)

## 2023-06-06 LAB — MAGNESIUM: Magnesium: 2.4 mg/dL (ref 1.7–2.4)

## 2023-06-06 LAB — BPAM RBC
Blood Product Expiration Date: 202410292359
Unit Type and Rh: 7300

## 2023-06-06 LAB — LACTATE DEHYDROGENASE: LDH: 959 U/L — ABNORMAL HIGH (ref 98–192)

## 2023-06-06 LAB — TYPE AND SCREEN
ABO/RH(D): B POS
Antibody Screen: NEGATIVE

## 2023-06-06 LAB — COMPREHENSIVE METABOLIC PANEL
ALT: 17 U/L (ref 0–44)
AST: 126 U/L — ABNORMAL HIGH (ref 15–41)
Albumin: 2 g/dL — ABNORMAL LOW (ref 3.5–5.0)
Alkaline Phosphatase: 122 U/L (ref 38–126)
Anion gap: 13 (ref 5–15)
BUN: 37 mg/dL — ABNORMAL HIGH (ref 8–23)
CO2: 22 mmol/L (ref 22–32)
Calcium: 8.7 mg/dL — ABNORMAL LOW (ref 8.9–10.3)
Chloride: 99 mmol/L (ref 98–111)
Creatinine, Ser: 1.23 mg/dL (ref 0.61–1.24)
GFR, Estimated: >60 mL/min (ref >60)
Glucose, Bld: 200 mg/dL — ABNORMAL HIGH (ref 70–99)
Potassium: 4.1 mmol/L (ref 3.5–5.1)
Sodium: 134 mmol/L — ABNORMAL LOW (ref 135–145)
Total Bilirubin: 4.3 mg/dL — ABNORMAL HIGH (ref 0.3–1.2)
Total Protein: 8.4 g/dL — ABNORMAL HIGH (ref 6.5–8.1)

## 2023-06-06 LAB — PROTIME-INR
INR: 1.4 — ABNORMAL HIGH (ref 0.8–1.2)
Prothrombin Time: 17.7 s — ABNORMAL HIGH (ref 11.4–15.2)

## 2023-06-06 LAB — PHOSPHORUS: Phosphorus: 3.3 mg/dL (ref 2.5–4.6)

## 2023-06-06 LAB — GLUCOSE, CAPILLARY
Glucose-Capillary: 155 mg/dL — ABNORMAL HIGH (ref 70–99)
Glucose-Capillary: 162 mg/dL — ABNORMAL HIGH (ref 70–99)
Glucose-Capillary: 170 mg/dL — ABNORMAL HIGH (ref 70–99)
Glucose-Capillary: 175 mg/dL — ABNORMAL HIGH (ref 70–99)
Glucose-Capillary: 175 mg/dL — ABNORMAL HIGH (ref 70–99)
Glucose-Capillary: 186 mg/dL — ABNORMAL HIGH (ref 70–99)
Glucose-Capillary: 224 mg/dL — ABNORMAL HIGH (ref 70–99)

## 2023-06-06 LAB — APTT
aPTT: 45 s — ABNORMAL HIGH (ref 24–36)
aPTT: 43 s — ABNORMAL HIGH (ref 24–36)

## 2023-06-06 LAB — FIBRINOGEN: Fibrinogen: 627 mg/dL — ABNORMAL HIGH (ref 210–475)

## 2023-06-06 LAB — TRIGLYCERIDES: Triglycerides: 188 mg/dL — ABNORMAL HIGH (ref <150)

## 2023-06-06 LAB — BILIRUBIN, DIRECT: Bilirubin, Direct: 2.7 mg/dL — ABNORMAL HIGH (ref 0.0–0.2)

## 2023-06-06 LAB — CG4 I-STAT (LACTIC ACID): Lactic Acid, Venous: 1.5 mmol/L (ref 0.5–1.9)

## 2023-06-06 MED ORDER — SODIUM CHLORIDE 0.9 % IV SOLN
0.0160 mg/kg/h | INTRAVENOUS | Status: DC
  Filled 2023-06-06: qty 250

## 2023-06-06 MED ORDER — INSULIN DETEMIR 100 UNIT/ML ~~LOC~~ SOLN
20.0000 [IU] | Freq: Two times a day (BID) | SUBCUTANEOUS | Status: DC
  Administered 2023-06-06 – 2023-06-07 (×3): 20 [IU] via SUBCUTANEOUS
  Filled 2023-06-06 (×4): qty 0.2

## 2023-06-06 MED ORDER — TRACE MINERALS CU-MN-SE-ZN 300-55-60-3000 MCG/ML IV SOLN
INTRAVENOUS | Status: AC
  Filled 2023-06-06: qty 876

## 2023-06-06 NOTE — Progress Notes (Addendum)
Pharmacy Antibiotic Note  Cortez Flippen is a 62 y.o. male admitted on 05/19/23 with  escalation in fungal CNS/PNA coverage .Pharmacy has been consulted for amphotericin B dosing.  BAL cx continue to grow few fungus (mold) - BAL cx from 9/20 growing candida albicans. Was started on micafungin on 9/20. Discussion with HF/CCM/CTVS on rounds - would like escalation in anti-fungal coverage. Voriconazole avoid given ECMO circuit given variable PK effects in studies. Isavuconazole is currently unavailable/not stocked. Posaconazole avoided given baseline prolonged Qtc, variable CNS penetration, and slightly elevated LFTs. CT head with no acute finding although completed without contrast. CT chest showing extensive bilateral pulmonary opacities consistent with PNA.   WBC 46.5, afebrile, on CRRT and TPN. LA 1.5. on 250 mL fluids running before and after AmphoB.  9/30: Due to no additional coverage provided with Micafungin, will discontinue and continue AmphoB. Deferring Cresemba at this time.  Plan: Discontinue Micafungin  Continue Amphotericin B liposome 240 mg (3 mg/kg) every 24 hours --F/U Aspergillus antibody galactomannan, blastomyces antigen Monitor electrolytes (K>4, Mag>2), signs of infusion reaction, cx results, clinical pic  Height: 5\' 6"  (167.6 cm) Weight: 76.9 kg (169 lb 8.5 oz) IBW/kg (Calculated) : 63.8  Temp (24hrs), Avg:97.4 F (36.3 C), Min:97 F (36.1 C), Max:97.7 F (36.5 C)  Recent Labs  Lab 05/30/23 2212 05/09/2023 0418 06/03/23 0415 06/03/23 0606 06/03/23 1606 06/04/23 0412 06/04/23 0413 06/04/23 1604 06/05/23 0346 06/05/23 0759 06/05/23 1621 06/06/23 0415 06/06/23 0419  WBC  --    < >  --   --    < >  --  41.8* 41.9* 44.5*  --  42.7* 46.5*  --   CREATININE  --    < >  --   --    < >  --  1.06 1.06 1.13  1.11  --  1.11 1.23  --   LATICACIDVEN  --    < > 2.0* 1.7  --  1.8  --   --   --  1.9  --   --  1.5  VANCOTROUGH 17  --   --   --   --   --   --   --   --   --    --   --   --    < > = values in this interval not displayed.    Estimated Creatinine Clearance: 60 mL/min (by C-G formula based on SCr of 1.23 mg/dL).    Allergies  Allergen Reactions   Other Anaphylaxis    Mushrooms  Not listed on MAR    Plavix [Clopidogrel Bisulfate] Other (See Comments)    TTP Not listed on the [facility]   Fleet Enema [Enema] Other (See Comments)    Unknown reaction   Lovenox [Enoxaparin] Other (See Comments)    Unknown reaction   Morphine Other (See Comments)    Unknown reaction   Nsaids Other (See Comments)    Unknown reaction   Hydrocodone-Acetaminophen Itching    Antimicrobials this admission: Ceftriaxone 9/11>> 9/15 Meropenem 9/15 >>9/28 Vanc 9/11>>9/27 Micafungin 9/20 >> 9/30 AmphoB 9/28>>  Dose adjustments this admission: N/A  Microbiology results: Blastomyces antigen 9/27: sent  Aspergillus antigen 9/27: sent  BAL 9/28: no orgs seen to date, few fungi (probable contaminant/colonizer - micro to identify) BAL 9/20: candida albicans, fungus/mold isolated (probable contaminant/colonizer - micro to identify) Aortic valve 9/13: ngtd Bcx 9/11: ngtd  Thank you for allowing pharmacy to participate in this patient's care,  Verdene Rio, PharmD PGY1 Pharmacy Resident

## 2023-06-06 NOTE — Progress Notes (Signed)
PHARMACY - TOTAL PARENTERAL NUTRITION CONSULT NOTE   Indication:  unable to tolerate tube feeding  Patient Measurements: Height: 5\' 6"  (167.6 cm) Weight: 76.9 kg (169 lb 8.5 oz) IBW/kg (Calculated) : 63.8 TPN AdjBW (KG): 69 Body mass index is 27.36 kg/m. Usual Weight: ~90kg  Assessment: 62 yo male with significant PMH for HLD, HTN, T2DM, CKD4 (prev on HD) and recent MSSA bacteremia + endocarditis (May 2024). Back in May 2024 admission, underwent ERCP for cholangitis/pancreatitis. Admitted on 9/11 w/ chest pain found to have aortic valve dehiscence. S/p aortic valve replacement redo on 9/13 - developed pulmonary edema post op and was placed on [place] ECMO, transitioned to VV ECMO 9/24. S/p mediastinal washout on 9/19 - chest still open. Unable to speak w/ patient due to intubation (utilizing Precedex for sedation). Pharmacy consulted to manage TPN for inability to tolerate tube feeding and malnutrition.   Glucose / Insulin: hx T2DM - A1c 6.1%, on Farxiga PTA. Previously on insulin gtt post CT surgery >> transitioned to rSSI q4h.  36 units SSI used + 5 units q4h for a total of 66 units in 24 hours. Detemir increased to 16u BID yesterday. CBGs [id number]. -Of note, patient is receiving Amphotericin B in dextrose 500 mL + D5 flushes before and after infusion (drug not compatible in other base fluid) Electrolytes: K 4.1 - stable (none in TPN), Mag 2.4 - trending up (none in TPN), CoCa 10.3 (upper end of normal) Renal: Hx CKD4 currently on CRRT (required HD in the past), using 4K bags, systemic bivalirudin for anticoag - not using systemic sodium citrate infusion, only for catheter lock per d/w RN at bedside on 9/24 CRRT 9/11 >>  Hepatic: Alk phos WNL, AST 116- trending down, ALT 17 - rising slightly, Tbili up to 5.2, albumin 2, TG 122 (9/23) Intake / Output:  - OG output 0mL - Stool output 215 mL, receiving Reglan 5mg  IV q6h - Chest tube output 18mL - CRRT removed - NGT placed 9/22, Cortrak  placed 9/20  MIVF: NS @ KVO GI Imaging:  - CT A/P 9/27: mild wall thickening in several small bowel loops suggesting enteritis, cholelithiasis GI Surgeries / Procedures:  - 9/24 transition to VV ECMO  Central access: 9/13 TPN start date: 9/23  Nutritional Goals: Goal concentrated TPN rate is 75 mL/hr (provides 131 g of protein and 2250 kcals per day)  RD Assessment: Estimated Needs Total Energy Estimated Needs: 2200-2400 Total Protein Estimated Needs: 130-150 grams Total Fluid Estimated Needs: 1L plus UOP  Current Nutrition:  NPO and TPN  Plan:  Continue concentrated TPN at 75 mL/hr at 1800 - will provide 2250 kcal and 131 g protein meeting ~100% of needs Electrolytes in TPN: Na 168mEq/L, K 71mEq/L, Ca 35mEq/L, Mg 60mEq/L, and Phos 20 mmol/L. Max chloride (still getting some bicarb) (TPN compounder unable to formulate other Cl:Ac ratios with current formula) Standard MVI and trace elements within TPN No chromium (on CRRT and currently on shortage) Continue Resistant q4h SSI and continue aspart 5 units q4h Increase detemir to 20 units SQ BID - will likely need to add regular insulin to TPN bag. Held off today pending ID plans for amphotericin (may switch). Discussed w/ team. [facility]F at [facility] Monitor TPN labs on Mon/Thurs, RFP BID while on CRRT Watch Mag and K closely while on ampho B  Rexford Maus, PharmD, BCPS 06/06/2023 7:40 AM

## 2023-06-06 NOTE — Progress Notes (Addendum)
ANTICOAGULATION CONSULT NOTE - Follow up Consult  Pharmacy Consult for bivalirudin Indication:  VV ECMO  Allergies  Allergen Reactions   Other Anaphylaxis    Mushrooms  Not listed on MAR    Plavix [Clopidogrel Bisulfate] Other (See Comments)    TTP Not listed on the [facility]   Fleet Enema [Enema] Other (See Comments)    Unknown reaction   Lovenox [Enoxaparin] Other (See Comments)    Unknown reaction   Morphine Other (See Comments)    Unknown reaction   Nsaids Other (See Comments)    Unknown reaction   Hydrocodone-Acetaminophen Itching    Patient Measurements: Height: 5\' 6"  (167.6 cm) Weight: 76.9 kg (169 lb 8.5 oz) IBW/kg (Calculated) : 63.8 Heparin Dosing Weight: 81.3 kg  Vital Signs: Temp: 97.7 F (36.5 C) (09/30 0715) Temp Source: Esophageal (09/30 0400) Pulse Rate: 77 (09/30 0715)  Labs: Recent Labs    06/04/23 0413 06/04/23 0843 06/05/23 0346 06/05/23 0347 06/05/23 1621 06/05/23 1631 06/06/23 0016 06/06/23 0415 06/06/23 0646  HGB 8.6*  9.9*   < > 8.1*   < > 8.3*   < > 9.5* 8.2* 9.2*  HCT 26.1*  29.0*   < > 25.0*   < > 25.2*   < > 28.0* 25.2* 27.0*  PLT 25*   < > 25*  --  28*  --   --  27*  --   APTT 48*   < > 45*  --  46*  --   --  45*  --   LABPROT 16.2*  --  17.6*  --   --   --   --  17.7*  --   INR 1.3*  --  1.4*  --   --   --   --  1.4*  --   CREATININE 1.06   < > 1.13  1.11  --  1.11  --   --  1.23  --    < > = values in this interval not displayed.    Estimated Creatinine Clearance: 60 mL/min (by C-G formula based on SCr of 1.23 mg/dL).   Assessment: 62 yom underwent Bentall/AVR, TV repair, re-implantation of SVG-RCA complicated with vasoplegia and pulmonary edema requiring central VA ECMO. On 9/24 patient underwent transition VA to VV ECMO.   Hgb stable at 8.2, plt 27. Some fibrin in circuit. LDH is 959. Fibrinogen trending up to 626. aPTT this morning is subtherapeutic at 45 sec on bivalirudin 0.007 mg/kg/hour. No s/sx of bleeding or infusion  issues.    Goal of Therapy:  aPTT 50-70 seconds Monitor platelets by anticoagulation protocol: Yes   Plan:  Increase bivalirudin at 0.008 mg/kg/hr  Monitor q12 hr aPTT and CBC, LDH, fibrinogen and for s/sx of bleeding.  Thank you for allowing pharmacy to participate in this patient's care,  Sherron Monday, PharmD, BCCCP Clinical Pharmacist  Phone: [phone] 06/06/2023 7:37 AM  Please check AMION for all [facility] Pharmacy phone numbers After 10:00 PM, call Main Pharmacy [phone]

## 2023-06-06 NOTE — Progress Notes (Signed)
[facility]    Date of Admission:  05/10/2023   Total days of antibiotics 20/day 3 L-ampho   ID: [name] is a 62 y.o. male with hx of MSSA PVE in may now admited for possible AV dehiscence and cardiogenic shock underwent redo AV and TV repair and bentyl procedure for AAR replacement  now on ECMO, CRRT. S/p VA to VV on 9/24. Principal Problem:   Cardiogenic shock (HCC) Active Problems:   CAD (coronary artery disease)   Type 2 diabetes mellitus without complication, without long-term current use of insulin (HCC)   OSA on CPAP   Symptomatic anemia   Acute on chronic congestive heart failure (HCC)   Prosthetic aortic valve failure   Acute respiratory failure with hypoxia (HCC)   Anemia   Protein-calorie malnutrition, severe    Subjective: Afebrile, off sedation, but same vasopressor needs. RN reports he was briefly able to follow commands  Medications:   artificial tears   Both Eyes Q8H   bisacodyl  10 mg Rectal Daily   Chlorhexidine Gluconate Cloth  6 each Topical Daily   dextrose  10 mL Intravenous Q24H   dextrose  10 mL Intravenous Q24H   insulin aspart  0-20 Units Subcutaneous Q4H   insulin aspart  5 Units Subcutaneous Q4H   insulin detemir  20 Units Subcutaneous BID   mouth rinse  15 mL Mouth Rinse Q2H   pantoprazole (PROTONIX) IV  40 mg Intravenous QHS   sodium chloride  250 mL Intravenous Q24H   sodium chloride  250 mL Intravenous Q24H   sodium chloride flush  3 mL Intravenous Q12H    Objective: Vital signs in last 24 hours: Temp:  [96.6 F (35.9 C)-97.7 F (36.5 C)] 96.6 F (35.9 C) (09/30 1315) Pulse Rate:  [72-79] 77 (09/30 1315) Resp:  [21-38] 36 (09/30 1315) BP: (116)/(55-57) 116/57 (09/30 1156) SpO2:  [100 %] 100 % (09/30 1315) Arterial Line BP: (93-129)/(40-60) 120/59 (09/30 1315) FiO2 (%):  [40 %] 40 % (09/30 1200) Weight:  [76.9 kg] 76.9 kg (09/30 0500)  Physical Exam  Constitutional:  He appears well-developed and  well-nourished. No distress.  HENT: OETT Mouth/Throat: Oropharynx is clear and moist. No oropharyngeal exudate. Some secretions in ETT Cardiovascular: Normal rate, regular rhythm and normal heart sounds. Exam reveals no gallop and no friction rub.  No murmur heard.  Pulmonary/Chest: Effort normal and breath sounds normal. No respiratory distress. He has no wheezes.  Abdominal: Soft. Bowel sounds are normal. He exhibits no distension. There is no tenderness.  Neck: lines covered appropriately Skin: Skin is warm and dry. No rash noted. No erythema.   Lab Results Recent Labs    06/05/23 1621 06/05/23 1631 06/06/23 0415 06/06/23 0646 06/06/23 0903  WBC 42.7*  --  46.5*  --   --   HGB 8.3*   < > 8.2* 9.2* 8.5*  HCT 25.2*   < > 25.2* 27.0* 25.0*  NA 133*   < > 134* 137 136  K 4.1   < > 4.1 4.1 3.8  CL 98  --  99  --   --   CO2 22  --  22  --   --   BUN 36*  --  37*  --   --   CREATININE 1.11  --  1.23  --   --    < > = values in this interval not displayed.   Liver Panel Recent Labs    06/04/23  0413 06/04/23 1604 06/05/23 0346 06/05/23 1621 06/06/23 0415  PROT 7.5  --  7.7  --  8.4*  ALBUMIN 2.0*  2.0*   < > 2.0*  2.0* 2.0* 2.0*  AST 108*  --  116*  --  126*  ALT 16  --  17  --  17  ALKPHOS 90  --  92  --  122  BILITOT 4.7*  --  5.2*  --  4.3*  BILIDIR 2.8*  --  3.0*  --  2.7*  IBILI 1.9*  --  2.2*  --   --    < > = values in this interval not displayed.   Sedimentation Rate No results for input(s): "ESRSEDRATE" in the last 72 hours. C-Reactive Protein No results for input(s): "CRP" in the last 72 hours.  Microbiology: 9/20 mold on respiratory cx 9/20 BAL- c.albicans 9/25 resp cx mold (fungus)  9/13 tissue cx -- NGTD  Studies/Results: DG CHEST PORT 1 VIEW  Result Date: 06/06/2023 CLINICAL DATA:  ECMO. EXAM: PORTABLE CHEST 1 VIEW COMPARISON:  Radiograph yesterday FINDINGS: Stable support apparatus including endotracheal tube, enteric tube, left subclavian  central line, right internal jugular dialysis catheter and large bore ECMO catheter, bilateral chest tubes and mediastinal drain. Stable lung volumes. Heterogeneous bilateral lung opacities without significant interval change, worse on the right. Small bilateral pleural effusions. No visible pneumothorax. IMPRESSION: 1. Stable support apparatus. 2. Heterogeneous bilateral lung opacities without significant interval change, worse on the right. 3. Small bilateral pleural effusions. Electronically Signed   By: Narda Rutherford M.D.   On: 06/06/2023 10:39   DG CHEST PORT 1 VIEW  Result Date: 06/05/2023 CLINICAL DATA:  Postop aortic valve replacement and replacement of the ascending aortic root 2 weeks ago. ARDS on ECMO. EXAM: PORTABLE CHEST 1 VIEW COMPARISON:  Radiographs 06/04/2023 and 06/03/2023.  CT 06/03/2023. FINDINGS: 0556 hours. Two views submitted. Patient remains rotated to the left. Tip of the endotracheal tube is unchanged at the level of the mid trachea. Enteric tube is looped in the proximal stomach. Right internal jugular and left subclavian venous catheters, bilateral chest tubes and ECMO apparatus appear unchanged. The heart size and mediastinal contours are stable post median sternotomy. Right greater than left airspace opacities and small bilateral pleural effusions are unchanged. No evidence of pneumothorax. IMPRESSION: Stable examination. Unchanged position of the support system. No significant change in bilateral airspace opacities and small bilateral pleural effusions. No evidence of pneumothorax. Electronically Signed   By: Carey Bullocks M.D.   On: 06/05/2023 09:54     Assessment/Plan: Pulmonary infection - thus far c.albicans isolated +/- mold. Unclear if this is invasive fungal infection at this time. Currently on L-ampho until further investigation. Can stop micafungin as L-amphotericin would cover c.albicans. await other serologic markers for IFI  ESRD on CRRT = continue with  L-ampho dosing per renal protocol  Leukocytosis = more or less the sam as it was 5-7 days. In part, stress reaction. Continue to follow  Hx of MSSA PVE = continue cefazolin renally dosed  [facility] Pager: [phone]  06/06/2023, 1:55 PM

## 2023-06-06 NOTE — Progress Notes (Signed)
Patient ID: [name], male   DOB: [birth year], 62 y.o.   MRN: [id number]   S:  Seen and examined on CRRT; procedure supervised.  He had 5.5 liters UF over 9/29 with CRRT.  Goal UF is net negative 100 ml/hr with goal CVP 7 per their last note.  No UOP is charted.  He continues on ECMO.  He is on epi at 5 mcg/min.  He hasn't gotten any sedation in about three days per RN - grimaces to pain but not much more on his shift.   Review of systems:  Unable to obtain secondary to intubated and obtunded     O:BP (!) 116/55   Pulse 77   Temp 97.7 F (36.5 C)   Resp (!) 31   Ht 5\' 6"  (1.676 m)   Wt 76.9 kg   SpO2 100%   BMI 27.36 kg/m   Intake/Output Summary (Last 24 hours) at 06/06/2023 0645 Last data filed at 06/06/2023 0600 Gross per 24 hour  Intake 3281.48 ml  Output 6187.6 ml  Net -2906.12 ml   Intake/Output: I/O last 3 completed shifts: In: 5818.3 [I.V.:3407.5; Other:90; NG/GT:60; IV Piggyback:2260.7] Out: 8756.9 [Emesis/NG output:50; Other:50; Stool:420; Chest Tube:18]  Intake/Output this shift:  Total I/O In: 1484.4 [I.V.:1104.4; Other:30; IV Piggyback:350] Out: 2955.2 [Emesis/NG output:70; Other:100; Stool:58; Chest Tube:20] Weight change: -1.9 kg  General adult male in bed critically ill  HEENT normocephalic atraumatic  Neck supple trachea midline Lungs coarse mechanical breath sounds  Heart no rub. On ECMO  Abdomen soft nontender nondistended Extremities no pitting edema  Neuro - no continuous sedation GU no foley  Access right subclavian dialysis catheter in place    Recent Labs  Lab 05/27/2023 0418 05/25/2023 0747 06/01/23 0424 06/01/23 0433 06/02/23 0403 06/02/23 0433 06/03/23 0405 06/03/23 0410 06/03/23 1606 06/03/23 1618 06/04/23 0413 06/04/23 4098 06/04/23 1604 06/04/23 1620 06/05/23 0346 06/05/23 0347 06/05/23 0751 06/05/23 1621 06/05/23 1631 06/06/23 0016 06/06/23 0415  NA 135   < > 137   < > 136  134*   < > 133*   < > 136   < > 135  136   <  > 135   < > 135  135 135 134* 133* 136 136 134*  K 3.8   < > 4.2   < > 4.1  4.1   < > 4.6   < > 4.6   < > 4.4  4.3   < > 4.7   < > 4.2  4.2 4.2 4.4 4.1 4.3 4.0 4.1  CL 101   < > 102   < > 96*  96*   < > 97*  --  102  --  101  --  99  --  99  98  --   --  98  --   --  99  CO2 23   < > 19*   < > 27  32   < > 27  --  25  --  23  --  23  --  22  23  --   --  22  --   --  22  GLUCOSE 172*   < > 204*   < > 202*  202*   < > 230*  --  215*  --  196*  --  228*  --  173*  171*  --   --  206*  --   --  200*  BUN 14   < > 18   < >  23  23   < > 24*  --  29*  --  29*  --  31*  --  34*  34*  --   --  36*  --   --  37*  CREATININE 1.06   < > 1.19   < > 0.98  1.03   < > 0.93  --  1.05  --  1.06  --  1.06  --  1.13  1.11  --   --  1.11  --   --  1.23  ALBUMIN 1.9*  1.9*   < > 2.0*  2.0*   < > 1.8*  1.8*   < > 1.9*  --  1.9*  --  2.0*  2.0*  --  2.0*  --  2.0*  2.0*  --   --  2.0*  --   --  2.0*  CALCIUM 8.7*   < > 8.1*   < > 8.0*  7.8*   < > 8.1*  --  7.9*  --  8.2*  --  8.3*  --  8.6*  8.6*  --   --  8.5*  --   --  8.7*  PHOS 2.8   < > 3.9   < > 2.2*  2.2*   < > 3.2  --  3.2  --  3.4  --  3.9  --  3.1  --   --  3.5  --   --  3.3  AST 69*  --  88*  --  112*  --  111*  --   --   --  108*  --   --   --  116*  --   --   --   --   --  126*  ALT 9  --  12  --  10  --  13  --   --   --  16  --   --   --  17  --   --   --   --   --  17   < > = values in this interval not displayed.   Liver Function Tests: Recent Labs  Lab 06/04/23 0413 06/04/23 1604 06/05/23 0346 06/05/23 1621 06/06/23 0415  AST 108*  --  116*  --  126*  ALT 16  --  17  --  17  ALKPHOS 90  --  92  --  122  BILITOT 4.7*  --  5.2*  --  4.3*  PROT 7.5  --  7.7  --  8.4*  ALBUMIN 2.0*  2.0*   < > 2.0*  2.0* 2.0* 2.0*   < > = values in this interval not displayed.   No results for input(s): "LIPASE", "AMYLASE" in the last 168 hours. Recent Labs  Lab 06/03/23 0742 06/04/23 1229  AMMONIA 26 40*   CBC: Recent Labs  Lab  06/04/23 0413 06/04/23 0843 06/04/23 1604 06/04/23 1620 06/05/23 0346 06/05/23 0347 06/05/23 1621 06/05/23 1631 06/06/23 0016 06/06/23 0415  WBC 41.8*  --  41.9*  --  44.5*  --  42.7*  --   --  46.5*  HGB 8.6*  9.9*   < > 8.5*   < > 8.1*   < > 8.3* 9.5* 9.5* 8.2*  HCT 26.1*  29.0*   < > 26.6*   < > 25.0*   < > 25.2* 28.0* 28.0* 25.2*  MCV 90.0  --  93.7  --  90.6  --  92.0  --   --  92.0  PLT 25*  --  26*  --  25*  --  28*  --   --  27*   < > = values in this interval not displayed.   Cardiac Enzymes: No results for input(s): "CKTOTAL", "CKMB", "CKMBINDEX", "TROPONINI" in the last 168 hours. CBG: Recent Labs  Lab 06/05/23 0749 06/05/23 1228 06/05/23 1626 06/05/23 1953 06/06/23 0012  GLUCAP 204* 220* 203* 222* 155*    Iron Studies: No results for input(s): "IRON", "TIBC", "TRANSFERRIN", "FERRITIN" in the last 72 hours. Studies/Results: DG CHEST PORT 1 VIEW  Result Date: 06/05/2023 CLINICAL DATA:  Postop aortic valve replacement and replacement of the ascending aortic root 2 weeks ago. ARDS on ECMO. EXAM: PORTABLE CHEST 1 VIEW COMPARISON:  Radiographs 06/04/2023 and 06/03/2023.  CT 06/03/2023. FINDINGS: 0556 hours. Two views submitted. Patient remains rotated to the left. Tip of the endotracheal tube is unchanged at the level of the mid trachea. Enteric tube is looped in the proximal stomach. Right internal jugular and left subclavian venous catheters, bilateral chest tubes and ECMO apparatus appear unchanged. The heart size and mediastinal contours are stable post median sternotomy. Right greater than left airspace opacities and small bilateral pleural effusions are unchanged. No evidence of pneumothorax. IMPRESSION: Stable examination. Unchanged position of the support system. No significant change in bilateral airspace opacities and small bilateral pleural effusions. No evidence of pneumothorax. Electronically Signed   By: Carey Bullocks M.D.   On: 06/05/2023 09:54     artificial tears   Both Eyes Q8H   bisacodyl  10 mg Rectal Daily   Chlorhexidine Gluconate Cloth  6 each Topical Daily   dextrose  10 mL Intravenous Q24H   dextrose  10 mL Intravenous Q24H   insulin aspart  0-20 Units Subcutaneous Q4H   insulin aspart  5 Units Subcutaneous Q4H   insulin detemir  16 Units Subcutaneous BID   mouth rinse  15 mL Mouth Rinse Q2H   pantoprazole (PROTONIX) IV  40 mg Intravenous QHS   sodium chloride  250 mL Intravenous Q24H   sodium chloride  250 mL Intravenous Q24H   sodium chloride flush  3 mL Intravenous Q12H    BMET    Component Value Date/Time   NA 134 (L) 06/06/2023 0415   NA 143 11/02/2022 1454   K 4.1 06/06/2023 0415   CL 99 06/06/2023 0415   CO2 22 06/06/2023 0415   GLUCOSE 200 (H) 06/06/2023 0415   BUN 37 (H) 06/06/2023 0415   BUN 31 (H) 11/02/2022 1454   CREATININE 1.23 06/06/2023 0415   CREATININE 2.71 (H) 05/19/2023 0950   CREATININE 1.05 02/24/2015 1601   CALCIUM 8.7 (L) 06/06/2023 0415   GFRNONAA >60 06/06/2023 0415   GFRNONAA 26 (L) 05/29/2023 0950   GFRNONAA 80 02/24/2015 1601   GFRAA 62 10/09/2020 1520   GFRAA >89 02/24/2015 1601   CBC    Component Value Date/Time   WBC 46.5 (H) 06/06/2023 0415   RBC 2.74 (L) 06/06/2023 0415   HGB 8.2 (L) 06/06/2023 0415   HGB 6.4 (LL) 05/19/2023 0852   HGB 10.2 (L) 08/12/2022 0934   HCT 25.2 (L) 06/06/2023 0415   HCT 29.2 (L) 08/12/2022 0934   PLT 27 (LL) 06/06/2023 0415   PLT 109 (L) 06/01/2023 0852   PLT 252 08/12/2022 0934   MCV 92.0 06/06/2023 0415   MCV 94 08/12/2022 0934   MCH 29.9 06/06/2023 0415   MCHC 32.5 06/06/2023 0415   RDW 17.6 (H) 06/06/2023 0415  RDW 15.5 (H) 08/12/2022 0934   LYMPHSABS 5.8 (H) 05/29/2023 1722   LYMPHSABS 1.9 11/04/2021 1028   MONOABS 6.3 (H) 05/29/2023 1722   EOSABS 0.0 05/29/2023 1722   EOSABS 0.2 11/04/2021 1028   BASOSABS 0.0 05/29/2023 1722   BASOSABS 0.1 11/04/2021 1028    Assessment/Plan:   Anuric AKI/dialysis dependent AKI with  fluid overload: Started on RRT during previous admission in May 2024 until 05/10/23 (recovered). Patient is status post cardiac surgery on 9/13 and currently on ECMO. CRRT start 9/11. No signs of renal recovery.   UF as tolerated by BP and ECMO Goal at present is net negative 100 ml/hr as tolerated; please page nephrology for UF goal changes  On angiomax per team for anticoagulation.  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  Per CTS Shock: multifactorial including cardiogenic, on pressors and ecmo. Right lung consolidation. Profound leukocytosis, followed by ID.    Fungal infection - Concern for invasive mold such as aspergillis infection - antifungals per ID  Acute hypoxic respiratory failure: on vent; per primary service. Abx/antifungal per primary service and ID   Postcardiotomy vasoplegia/shock with failure to wean: Currently on ECMO per HF team.  On pressors per primary service.  Volume managing with CRRT. S/p washout 9/19 Anemia, thrombocytopenia: Transfuse as needed per primary team. Hypophosphatemia - repletion prn  Disposition - in ICU on CRRT, ECMO   Estanislado Emms, MD 7:08 AM 06/06/2023

## 2023-06-06 NOTE — Progress Notes (Signed)
NAME:  [name], MRN:  [id number], DOB:  [birth year], LOS: 19 ADMISSION DATE:  06/01/2023, CONSULTATION DATE:  9/11 REFERRING MD:  Dr. Izora Ribas, CHIEF COMPLAINT:  aortic valve dehiscence   History of Present Illness:  Patient is [name] w/ pertinent PMH CAD s/p CABG 2017 w/ AVR, HLD, HTN, prior CVA, T2DM, CKD4 was previously on HD presents to [facility] on 9/10 chest pain.  Patient recently admitted to [facility] on 5/25 w/ AKI and AMS. While in ED patient had PEA arrest w/ ROSC in about 8 minutes. Patient required dialysis on this admission. Also w/ MSSA bacteremia associated w/ tricuspid valve endocarditis. Patient transferred to [facility] on 5/25. Treated w/ 6 week course oxacillin and rifampin. This admission also suffered a R cerebellar CVA w/ MRI likely septic emboli and had cholangitis/pancreatitis requiring ERCP.  On 9/10 patient admitted to [facility] w/ chest pain, dyspnea, and BLE edema. BNP 2,097. CXR w/ pulm edema. Patient started on IV lasix infusion. Cards consulted. On 9/11 patient transferred to [facility]. Patient's echo showing possible aortic valve dehiscence. Cultures repeated and started on rocephin. Patient transferred to ICU for TEE and general anesthesia and to start CRRT. PCCM consulted.  Pertinent  Medical History   Past Medical History:  Diagnosis Date   Allergy    Anemia    Anxiety    Baker's cyst of knee    Blood transfusion without reported diagnosis    as baby    Coronary artery disease    quadruple bypass - March 2016   GERD (gastroesophageal reflux disease)    Gouty arthritis    "real bad" (01/17/2013)   Heart murmur    Hypercholesteremia    Hypertension    MSSA bacteremia 01/29/2023   Myocardial infarction (HCC) 2017   PEA (Pulseless electrical activity) (HCC) 01/29/2023   Stroke (HCC)    Type II diabetes mellitus (HCC)     Significant Hospital Events: Including procedures, antibiotic start and stop dates in addition to other pertinent events    9/10 admitted 9/11 echo showing aortic valve dehiscence, confirmed by TEE.  9/11 started (back) on CRRT for volume overload and known CKD 4 9/12 breathing better after CRRT 9/13 to OR for Redo of aortic valve, tricuspid valve repair and ascending aortic root replacement w/ re-implantation of SVG to RCA. Post op TEE EF 55% RV mid-mod HK, AVR/TV repair stable. Could not come off CPB. Worse pulmonary edema. High dose pressors. Cannulated and started on VA ECMO w/ central cannulation  9/16 MDT ECMO rounds this AM, plans to remove more volume, 1 U PRBCs  9/18 bronch, clot and mucus plugs removed  9/19 OR for washout  9/20 bronch 9/21 bronch 9/24 VA to VV, some bleeding and acidemia issues postop 9/25 ett exchange and bronch  Interim History / Subjective:  More responsive overnight. Sedation remains off.   Objective   Blood pressure (!) 116/55, pulse 77, temperature 97.7 F (36.5 C), resp. rate (!) 28, height 5\' 6"  (1.676 m), weight 76.9 kg, SpO2 100%. CVP:  [7 mmHg-15 mmHg] 9 mmHg  Vent Mode: PCV FiO2 (%):  [40 %] 40 % Set Rate:  [24 bmp] 24 bmp PEEP:  [10 cmH20] 10 cmH20 Plateau Pressure:  [26 cmH20-27 cmH20] 27 cmH20   Intake/Output Summary (Last 24 hours) at 06/06/2023 0704 Last data filed at 06/06/2023 0700 Gross per 24 hour  Intake 3288 ml  Output 6207 ml  Net -2919 ml   Filed Weights   06/04/23  0500 06/05/23 0500 06/06/23 0500  Weight: 80.5 kg 78.8 kg 76.9 kg    Examination: Critically ill appearing man lying in bed in NAD [place]/AT, scleral injection. ETT in place.  S1S2, RRR Rhales bilaterally, minimal ETT secretions More coughing today, stronger cough,  breathing over the vent. Minimally responsive during my exam.  Abd soft, hypoactive bowel sounds Skin warm, dry, no diffuse rashes.  No significant edema, muscle wasting. L femoral Aline.    7.41/38/178/24 BUN 37, CR 1.23 on CRRT AST 126 ALT 17 T bili 4.3 WBC 46.5 H/H 8.2/25.2 Platelets 27 Fibrinogen 627 INR  1.4 LDH 959, downtrending  Vent changed to PRVC: Pplat 30 on 26/250/10/40% ECMO sweep 7L   Cryptococcal antigen negative Blasto antigen pending 9/25 resp culture: few mold> speciation pending (send out to [facility]) 9/20 BAL fungal culture: fungal elements positive; Candida albicans.  CXR personally reviewed- R>L infiltrates, RIJ ECMO cannula and tunneled catheter. ETT in appropriate position.   Assessment & Plan:   Post cardiotomy vasoplegia & cardioplegia w/ failure to wean from CPB- improved; 9/24 converted to VV [facility] for refractory hypoxia. Acute on chronic combined HF, Hx MSSA endocarditis- S/p AVR/Bentall and reimplantation of coronaries and TV repair 9/13 CAD s/p CABG in 2017 w/ AVR Junctional rhythm-stable without pacing -while strict NPO holding aspirin, statin, zetia -tele monitoring -epi to maintain MAP>65, SBP >100  Acute respiratory failure w/ hypoxia  on MV & VV ECMO - multifactorial; initially had hypervolemia.  Bilateral pneumonia & ARDS- appears to be fungal pneumonia. Main risk factors are prolonged critical illness, previous sepsis in the past 6 months.  History of untreated OSA -Con't VV Ecmo, trying to be aggressive with MV, change to [facility] Keep Pplat goal 30. -needs tracheostomy this week; will discuss with son today -Con't VV Ecmo support -con't aggressive antifungal support; Cresemba should  be available today to switch off amphotericin. -[facility] sendout labs pending for fungal speciation -can consider adding steroids; with a fungal infection seems risky  AKI on CKD 4. Had recently been on HD- now on CRRT Circuit related hemolysis, thrombocytopenia consumptive- stable -CRRT per nephrology; con't pulling -50cc/h as long as no negative hemodynamic impacts -strict I/O -family understands he is committed to dialysis for life at this point; no expectation for renal recovery -renally dose meds  TF intolerance- ongoing issue now on TPN -con't NGT to  LIWS -con't TPN; his lungs cannot handle an additional insult if he were to have an aspiration event -holding reglan due to Qtc concerns   Acute encephalopathy, concern for metabolic cause from heavy sedation, but not waking up off sedation for several days.  No acute findings on head CT 9/27. Likely has sequestered versed into circuit and deposited on adipose tissue, prolonging clearance time.  Hx of CVA Mild hyperammoniemia-- does not fully explain degree of encephalopathy -unable to take lactulose currently; if ammonia rises could consider lactulose enemas -con't to hold sedation as able; has PRN sedation available  HTN HLD -holding all oral meds  DMT2; uncontrolled despite aggressive increases in insulin over the past few days -con't aspart 5 units q4h as TPN coverage with hold parameters -increased semglee 20 units BID -SSI PRN -goal BG 140-180 -need to know if insulin is added to TPN-- would stop or decrease the Q4h aspart coverage  Leukocytosis, multifactorial, persistent -monitor  Anemia due to critical illness, operative blood loss Thrombocytopenia -transfuse for Hb <7.5 or hemodynamically significant bleeding -con't bival, monitor for bleeding -will need platelet transfusions to get trach  Hyperbilirubinemia  (direct and indirect), slowly improving-- most likely liver dysfunction from acute illness. LFTs are stable but mildly elevated. Has a history of cholelithiasis requiring ERCP and stenting in 03/2023. -con't monitoring  -may need RUQ [place] if bilirubin continues to rise; he is at risk of acalculous cholecystitis   Best Practice (right click and "Reselect all SmartList Selections" daily)   Diet/type: TPN DVT prophylaxis: bival GI prophylaxis: PPI Lines: Central line, Dialysis Catheter, and Arterial Line Foley: yes Code Status:  full code Last date of multidisciplinary goals of care discussion [per TCTS and AHF]   This patient is critically ill with multiple  organ system failure which requires frequent high complexity decision making, assessment, support, evaluation, and titration of therapies. This was completed through the application of advanced monitoring technologies and extensive interpretation of multiple databases. During this encounter critical care time was devoted to patient care services described in this note for 40 minutes.  Steffanie Dunn, DO 06/06/23 8:33 AM [facility]  For contact information, see Amion. If no response to pager, please call PCCM consult pager. After hours, 7PM- 7AM, please call Elink.

## 2023-06-06 NOTE — Progress Notes (Signed)
Called to update son Adonis Housekeeper.  Discussed recommendation for tracheostomy. Discussed risks and benefits with him. He agreed with proceeding with this tomorrow. He agreed with platelet transfusion before the procedure.   Steffanie Dunn, DO 06/06/23 5:58 PM [facility]  For contact information, see Amion. If no response to pager, please call PCCM consult pager. After hours, 7PM- 7AM, please call Elink.

## 2023-06-06 NOTE — Progress Notes (Signed)
ANTICOAGULATION CONSULT NOTE - Follow up Consult  Pharmacy Consult for bivalirudin Indication:  VV ECMO  Allergies  Allergen Reactions   Other Anaphylaxis    Mushrooms  Not listed on MAR    Plavix [Clopidogrel Bisulfate] Other (See Comments)    TTP Not listed on the [facility]   Fleet Enema [Enema] Other (See Comments)    Unknown reaction   Lovenox [Enoxaparin] Other (See Comments)    Unknown reaction   Morphine Other (See Comments)    Unknown reaction   Nsaids Other (See Comments)    Unknown reaction   Hydrocodone-Acetaminophen Itching    Patient Measurements: Height: 5\' 6"  (167.6 cm) Weight: 76.9 kg (169 lb 8.5 oz) IBW/kg (Calculated) : 63.8 Heparin Dosing Weight: 81.3 kg  Vital Signs: Temp: 97.7 F (36.5 C) (09/30 1745) Temp Source: Esophageal (09/30 1600) BP: 114/55 (09/30 1543) Pulse Rate: 78 (09/30 1745)  Labs: Recent Labs    06/04/23 0413 06/04/23 0843 06/05/23 0346 06/05/23 0347 06/05/23 1621 06/05/23 1631 06/06/23 0415 06/06/23 0646 06/06/23 0903 06/06/23 1617  HGB 8.6*  9.9*   < > 8.1*   < > 8.3*   < > 8.2* 9.2* 8.5* 8.1*  HCT 26.1*  29.0*   < > 25.0*   < > 25.2*   < > 25.2* 27.0* 25.0* 24.2*  PLT 25*   < > 25*  --  28*  --  27*  --   --  29*  APTT 48*   < > 45*  --  46*  --  45*  --   --  43*  LABPROT 16.2*  --  17.6*  --   --   --  17.7*  --   --   --   INR 1.3*  --  1.4*  --   --   --  1.4*  --   --   --   CREATININE 1.06   < > 1.13  1.11  --  1.11  --  1.23  --   --  1.27*   < > = values in this interval not displayed.    Estimated Creatinine Clearance: 58.1 mL/min (A) (by C-G formula based on SCr of 1.27 mg/dL (H)).   Assessment: 62 yom underwent Bentall/AVR, TV repair, re-implantation of SVG-RCA complicated with vasoplegia and pulmonary edema requiring central VA ECMO. On 30-Jun-2023 patient underwent transition VA to VV ECMO.   Hgb stable at 8.2, plt 27. Some fibrin in circuit. LDH is 959. Fibrinogen trending up to 626. aPTT this morning is  subtherapeutic at 45 sec on bivalirudin 0.007 mg/kg/hour. No s/sx of bleeding or infusion issues.   PM: aPTT remains subtherapeutic at 43 - no new issues per d/w RN.   Goal of Therapy:  aPTT 50-70 seconds Monitor platelets by anticoagulation protocol: Yes   Plan:  Increase bivalirudin at 0.01 mg/kg/hr  Monitor q12 hr aPTT and CBC, LDH, fibrinogen and for s/sx of bleeding.  Thank you for allowing pharmacy to participate in this patient's care,  Loralee Pacas, PharmD, BCPS 06/06/2023 5:52 PM  Please check AMION for all [facility] Pharmacy phone numbers After 10:00 PM, call Main Pharmacy [phone]

## 2023-06-06 NOTE — Progress Notes (Signed)
Patient ID: Hassell Patras, male   DOB: May 19, 1961, 62 y.o.   MRN: [id number]   Extracorporeal support note   ECLS support day: 7 (V-V) Indication: Acute hypoxic respiratory failure  Configuration: Crescent VV ECMO  30FR Dual-lumen Crescent cannula in RIJ   Pump speed: 3.36 rpm  Pump flow: 3.4 L/min Pump used: Cardiohelp  Sweep gas: 8  ABG: 7.4/38/178 LDH: 959  Circuit check: Small clot at 12p Anticoagulant: On bival Anticoagulation targets:  PTT 50-60  Changes in support: none  Anticipated goals/duration of support: Wean VV ECMO as able; has significant pulmonary infiltrates.   Eliezer Lofts Ziquan Fidel DO  06/06/2023, 7:48 AM

## 2023-06-06 NOTE — Progress Notes (Signed)
6 Days Post-Op Procedure(s) (LRB): CONVERSION TO VV ECMO (EXTRACORPOREAL MEMBRANE OXYGENATION) (N/A) DECANNULATION FOR ECMO (N/A) TRANSESOPHAGEAL ECHOCARDIOGRAM (N/A) Subjective:  Hemodynamically stable on epi 6 mcg.  -2900 cc yesterday on CRRT. Wt down about 4 lbs.  Objective: Vital signs in last 24 hours: Temp:  [96.6 F (35.9 C)-97.7 F (36.5 C)] 97.3 F (36.3 C) (09/30 1530) Pulse Rate:  [72-79] 78 (09/30 1530) Cardiac Rhythm: Junctional rhythm;Bundle branch block (09/30 1200) Resp:  [21-38] 31 (09/30 1530) BP: (114-116)/(55-57) 114/55 (09/30 1543) SpO2:  [100 %] 100 % (09/30 1530) Arterial Line BP: (91-129)/(22-60) 108/52 (09/30 1530) FiO2 (%):  [40 %] 40 % (09/30 1543) Weight:  [76.9 kg] 76.9 kg (09/30 0500)  Hemodynamic parameters for last 24 hours: CVP:  [7 mmHg-15 mmHg] 7 mmHg  Intake/Output from previous day: 09/29 0701 - 09/30 0700 In: 3288 [I.V.:2367.4; NG/GT:60; IV Piggyback:800.6] Out: 6207 [Emesis/NG output:120; Stool:263; Chest Tube:20] Intake/Output this shift: Total I/O In: 849.9 [I.V.:750; IV Piggyback:99.9] Out: 1272.2 [Stool:35]  General appearance: intubated on vent.  Neurologic: reportedly followed command for nurse this am.  Heart: regular rate and rhythm, S1, S2 normal, no murmur Lungs: rales bilaterally Abdomen: soft, non-tender; no bowel sounds Extremities: warm, no edema Wound: chest incision looks ok per nurse who changed dressing today.  Lab Results: Recent Labs    06/05/23 1621 06/05/23 1631 06/06/23 0415 06/06/23 0646 06/06/23 0903  WBC 42.7*  --  46.5*  --   --   HGB 8.3*   < > 8.2* 9.2* 8.5*  HCT 25.2*   < > 25.2* 27.0* 25.0*  PLT 28*  --  27*  --   --    < > = values in this interval not displayed.   BMET:  Recent Labs    06/05/23 1621 06/05/23 1631 06/06/23 0415 06/06/23 0646 06/06/23 0903  NA 133*   < > 134* 137 136  K 4.1   < > 4.1 4.1 3.8  CL 98  --  99  --   --   CO2 22  --  22  --   --   GLUCOSE 206*  --   200*  --   --   BUN 36*  --  37*  --   --   CREATININE 1.11  --  1.23  --   --   CALCIUM 8.5*  --  8.7*  --   --    < > = values in this interval not displayed.    PT/INR:  Recent Labs    06/06/23 0415  LABPROT 17.7*  INR 1.4*   ABG    Component Value Date/Time   PHART 7.421 06/06/2023 0903   HCO3 22.4 06/06/2023 0903   TCO2 23 06/06/2023 0903   ACIDBASEDEF 2.0 06/06/2023 0903   O2SAT 100 06/06/2023 0903   CBG (last 3)  Recent Labs    06/06/23 0302 06/06/23 0812 06/06/23 1130  GLUCAP 170* 175* 175*   CXR: bilateral air space disease, right worse than left.  Assessment/Plan: S/P Procedure(s) (LRB): CONVERSION TO VV ECMO (EXTRACORPOREAL MEMBRANE OXYGENATION) (N/A) DECANNULATION FOR ECMO (N/A) TRANSESOPHAGEAL ECHOCARDIOGRAM (N/A)  Hemodynamically stable on epi.   Acute hypoxemic respiratory failure on VV ECMO and vent. Bilateral pneumonia, ? Fungal.  Will need trach. CCM and AHF team managing.  Amphotericin for c.albicans and mold in BAL per ID team. Ancef for MSSA PVE given TV endocarditis in May.  AKI On CKD stage 4-5 preop  on HD until paused a week preop. Continue CRRT.  Ileus with no BS: continue TNA.  Encephalopathy off sedatives for several days. Head CT negative for acute disease but old frontal stroke. ? If this is toxic/metabolic, slow clearance of drugs.   Minimal pleural tube drainage but keeping in for now.   LOS: 19 days    Alleen Borne 06/06/2023

## 2023-06-06 NOTE — Plan of Care (Signed)
  Problem: Activity: Goal: Ability to tolerate increased activity will improve Outcome: Not Progressing   Problem: Respiratory: Goal: Ability to maintain a clear airway and adequate ventilation will improve Outcome: Not Progressing   Problem: Role Relationship: Goal: Method of communication will improve Outcome: Not Progressing   Problem: Education: Goal: Ability to manage disease process will improve Outcome: Not Progressing   Problem: Cardiac: Goal: Ability to achieve and maintain adequate cardiopulmonary perfusion will improve Outcome: Progressing   Problem: Neurologic: Goal: Promote progressive neurologic recovery Outcome: Not Progressing   Problem: Skin Integrity: Goal: Risk for impaired skin integrity will be minimized. Outcome: Progressing   Problem: Fluid Volume: Goal: Ability to maintain a balanced intake and output will improve Outcome: Progressing   Problem: Metabolic: Goal: Ability to maintain appropriate glucose levels will improve Outcome: Not Progressing   Problem: Nutritional: Goal: Maintenance of adequate nutrition will improve Outcome: Progressing Goal: Progress toward achieving an optimal weight will improve Outcome: Progressing   Problem: Skin Integrity: Goal: Risk for impaired skin integrity will decrease Outcome: Not Progressing   Problem: Tissue Perfusion: Goal: Adequacy of tissue perfusion will improve Outcome: Progressing   Problem: Education: Goal: Ability to describe self-care measures that may prevent or decrease complications (Diabetes Survival Skills Education) will improve Outcome: Not Progressing   Problem: Coping: Goal: Ability to adjust to condition or change in health will improve Outcome: Not Progressing   Problem: Clinical Measurements: Goal: Ability to maintain clinical measurements within normal limits will improve Outcome: Progressing Goal: Will remain free from infection Outcome: Not Progressing Goal: Diagnostic  test results will improve Outcome: Not Progressing Goal: Respiratory complications will improve Outcome: Progressing Goal: Cardiovascular complication will be avoided Outcome: Progressing

## 2023-06-06 NOTE — Procedures (Signed)
Arterial Catheter Insertion Procedure Note  [name]  [id number]  1960-10-26  Date:06/06/23  Time:9:17 PM    Provider Performing: Benjamine Sprague BS, RRT-ACCS, RCP   Procedure: Insertion of Arterial Line (40981) without US guidance  Indication(s) Blood pressure monitoring and need for frequent ABGs  Consent Unable to obtain consent due to emergent nature of procedure.  Anesthesia None   Time Out Verified patient identification, verified procedure, site/side was marked, verified correct patient position, special equipment/implants available, medications/allergies/relevant history reviewed, required imaging and test results available.   Sterile Technique Maximal sterile technique including full sterile barrier drape, hand hygiene, sterile gown, sterile gloves, mask, hair covering, sterile ultrasound probe cover (if used).   Procedure Description Area of catheter insertion was cleaned with chlorhexidine and draped in sterile fashion. Without real-time ultrasound guidance, manual palpitation was used to locate the left radial artery. Once located, I then cannulated and placed an arterial catheter into the left radial artery.  Appropriate arterial tracings confirmed on monitor.  Line pulls back and flushes without complications.    Complications/Tolerance None; patient tolerated the procedure well.   [name]  BS, RRT-ACCS, RCP

## 2023-06-06 NOTE — Progress Notes (Signed)
Patient ID: [name], male   DOB: [birth year], 62 y.o.   MRN: [id number]     Advanced Heart Failure Rounding Note  PCP-Cardiologist: Kristeen Miss, MD   Subjective:    06-19-23: OR for Bentall/AVR, TV repair, re-implantation of SVG-RCA.  Post-op vasoplegia and pulmonary edema, required central [place] ECMO (RA drainage, ascending aorta return.  Post-op TEE EF 55%, mild-moderate RV hypokinesis.  9/14: Blender added to circuit due to high PaO2 9/16: TEE with EF 30%, moderate LVH, moderate RV dysfunction with mild enlargement, stable bioprosthetic aortic valve, repaired TV with mild TR, no endocarditis noted.  9/18: Bronchoscopy with mucus plugs and clots. 9/19: To OR for mediastinal washout.  By TEE, EF 50% with moderate LVH, mild RV enlargement and mild RV dysfunction, stable bioprosthetic aortic valve, repaired TV with mild TR.  He did not tolerate lowering of ECMO flows even with increasing of pressors due to vasoplegia.  9/21: Bronchoscopy  BAL: growing candida and dold 9/24: Decannulated from [facility] ECMO. Chest closed. Transitioned to VV ECMO  On VV ECMO. Circuit stable.   - Negative 3L over the past 24h with improvement in CVP to 6-8 - Increasingly responsive today; intermittently following verbal commands. - Epi 6-79mcg  Objective:   Weight Range: 76.9 kg Body mass index is 27.36 kg/m.   Vital Signs:   Temp:  [97 F (36.1 C)-97.7 F (36.5 C)] 97.7 F (36.5 C) (09/30 0730) Pulse Rate:  [70-79] 77 (09/30 0730) Resp:  [22-38] 34 (09/30 0730) BP: (116-134)/(55-68) 116/55 (09/29 1429) SpO2:  [100 %] 100 % (09/30 0730) Arterial Line BP: (93-129)/(40-64) 115/56 (09/30 0730) FiO2 (%):  [40 %] 40 % (09/30 0400) Weight:  [76.9 kg] 76.9 kg (09/30 0500) Last BM Date : 06/05/23  Weight change: Filed Weights   06/04/23 0500 06/05/23 0500 06/06/23 0500  Weight: 80.5 kg 78.8 kg 76.9 kg    Intake/Output:   Intake/Output Summary (Last 24 hours) at 06/06/2023 0753 Last data filed at 06/06/2023  0700 Gross per 24 hour  Intake 3288 ml  Output 6207 ml  Net -2919 ml      Physical Exam    General:  Critically-ill appearing. On vent.  HEENT: + ETT Neck:  +VV ECMO cannula + swan Cor: Chest dressing. + CTs   Lungs: minimal but improving lung sounds on right (minimal); rales/coarse on the left Abdomen: soft, nontender, nondistended. No hepatosplenomegaly. No bruits or masses. Good bowel sounds. Extremities: no cyanosis, clubbing, rash, no edema Neuro: intubated/sedated  Telemetry   Junctional rhythm + PVCs Personally reviewed  Labs    CBC Recent Labs    06/05/23 1621 06/05/23 1631 06/06/23 0415 06/06/23 0646  WBC 42.7*  --  46.5*  --   HGB 8.3*   < > 8.2* 9.2*  HCT 25.2*   < > 25.2* 27.0*  MCV 92.0  --  92.0  --   PLT 28*  --  27*  --    < > = values in this interval not displayed.   Basic Metabolic Panel Recent Labs    81/19/14 0346 06/05/23 0347 06/05/23 1621 06/05/23 1631 06/06/23 0415 06/06/23 0646  NA 135  135   < > 133*   < > 134* 137  K 4.2  4.2   < > 4.1   < > 4.1 4.1  CL 99  98  --  98  --  99  --   CO2 22  23  --  22  --  22  --  GLUCOSE 173*  171*  --  206*  --  200*  --   BUN 34*  34*  --  36*  --  37*  --   CREATININE 1.13  1.11  --  1.11  --  1.23  --   CALCIUM 8.6*  8.6*  --  8.5*  --  8.7*  --   MG 2.4  --   --   --  2.4  --   PHOS 3.1  --  3.5  --  3.3  --    < > = values in this interval not displayed.   Liver Function Tests Recent Labs    06/05/23 0346 06/05/23 1621 06/06/23 0415  AST 116*  --  126*  ALT 17  --  17  ALKPHOS 92  --  122  BILITOT 5.2*  --  4.3*  PROT 7.7  --  8.4*  ALBUMIN 2.0*  2.0* 2.0* 2.0*   BNP: BNP (last 3 results) Recent Labs    05/19/2023 1100  BNP 2,097.0*     Imaging    No results found.   Medications:     Scheduled Medications:  artificial tears   Both Eyes Q8H   bisacodyl  10 mg Rectal Daily   Chlorhexidine Gluconate Cloth  6 each Topical Daily   dextrose  10 mL  Intravenous Q24H   dextrose  10 mL Intravenous Q24H   insulin aspart  0-20 Units Subcutaneous Q4H   insulin aspart  5 Units Subcutaneous Q4H   insulin detemir  16 Units Subcutaneous BID   mouth rinse  15 mL Mouth Rinse Q2H   pantoprazole (PROTONIX) IV  40 mg Intravenous QHS   sodium chloride  250 mL Intravenous Q24H   sodium chloride  250 mL Intravenous Q24H   sodium chloride flush  3 mL Intravenous Q12H    Infusions:   prismasol BGK 4/2.5 400 mL/hr at 06/06/23 0105    prismasol BGK 4/2.5 400 mL/hr at 06/06/23 0255   sodium chloride Stopped (05/24/23 0032)   sodium chloride Stopped (06/06/23 0237)   albumin human Stopped (05/30/23 0819)   amphotericin B liposome (AMBISOME) 240 mg in dextrose 5 % 500 mL IVPB Stopped (06/05/23 1644)   anticoagulant sodium citrate     bivalirudin (ANGIOMAX) 250 mg in sodium chloride 0.9 % 500 mL (0.5 mg/mL) infusion 0.007 mg/kg/hr (06/06/23 0700)    ceFAZolin (ANCEF) IV Stopped (06/05/23 2151)   epinephrine 5 mcg/min (06/06/23 0700)   micafungin (MYCAMINE) 200 mg in sodium chloride 0.9 % 100 mL IVPB 110 mL/hr at 06/06/23 0700   norepinephrine (LEVOPHED) Adult infusion Stopped (06/01/23 1405)   prismasol BGK 4/2.5 1,500 mL/hr at 06/06/23 0517   TPN ADULT (ION) 75 mL/hr at 06/06/23 0700   vasopressin Stopped (06/04/23 0127)    PRN Medications: sodium chloride, sodium chloride, acetaminophen, acetaminophen, albumin human, anticoagulant sodium citrate, artificial tears, dextrose, diphenhydrAMINE **OR** diphenhydrAMINE, HYDROmorphone (DILAUDID) injection, meperidine (DEMEROL) injection, midazolam, mouth rinse, oxidized cellulose, sodium chloride flush    Assessment/Plan   1. Post-cardiotomy vasoplegia/shock with failure to wean from CPB: Now on central VA ECMO (2023/05/30) with RA drainage/ascending aorta return. Post-op echo with EF 55%, mild-moderate RV dysfunction. Unable to get images yesterday by TTE.  TEE on 9/16 showed EF 30%, moderate LVH, moderate  RV dysfunction with mild enlargement, stable bioprosthetic aortic valve, repaired TV with mild TR, no endocarditis noted.  To OR for mediastinal washout on 9/19.  By TEE 9/19, EF 50% with moderate LVH,  mild RV enlargement and mild RV dysfunction, stable bioprosthetic aortic valve, repaired TV with mild TR.  He did not tolerate lowering of ECMO flows even with increasing of pressors due to vasoplegia. CXR with clearing left lung, right lung with persistent airspace disease not clearing with CVVH. Switch from [place] ECMO to VV ECMO with chest closure on 05/09/2023 - Epinephrine ; negative 3L over the past 24h.  - Intermittently appears to be following verbal commands - Will continue to pull slightly net negative on CVVHD - LDH stable. Continue bival. Discussed dosing with PharmD personally. - Prognosis remains guarded.   2. Acute hypoxic respiratory failure, post-op: On vent, rest settings. Blender added to keep PaO2 down. Sweep remains 4.5. CXR with some clearing of left lung, right lung with ongoing diffuse airspace disease without change.  Suspect extensive PNA, think we have done what we can with CVVH for lung clearing at this point. Bronchoscopy yesterday with no change in CXR.  - Now on VV ECMO  - R lung consolidation is severe. BAL from 9/20 + candida and mold - Meropenem/vancomycin/micafungin/amphotericin for coverage of PNA - CT chest with extensive infiltrates. - Will need trach.  - D/w CCM  3. Severe prosthetic AI: Due to previous MSSA endocarditis with partial valve dehiscence s/p re-do AVR/Bentall with reimplantation of SVG-RCA and TV repair 05/30/2023.   - Continuing meropenem/vancomycin/micafungin/amphotericin.  4. CAD s/p CABG/AVR 2017:  s/p AVR/Bentall and re-implantation of SVG-RCA 06/06/2023. - Off ASA with platelets down.   5. ESRD: CVVH as above. Keep negative  6. DM2: management per CCM  7. Heart block: Post-op, now in stable junctional rhythm.    8. H/o CVA: Prior cerebellar  CVA.   9. Thrombocytopenia: monitoring. Suspect inflammatory and hemolysis.   10. Acute blood loss anemia: Post-op, transfuse hgb < 8.   11. ID: H/o MSSA bacteremia.  WBCs 25K.  - Meropenem + vancomycin - Micafungin for fungal coverage added 9/20 - Started amphotericin 06/04/23; slight improvement in lung sounds.   12. FEN: Receiving Tfs  13. Liver failure, post-op  CRITICAL CARE Performed by: Dorthula Nettles   Total critical care time: 43 minutes  Critical care time was exclusive of separately billable procedures and treating other patients.  Critical care was necessary to treat or prevent imminent or life-threatening deterioration.  Critical care was time spent personally by me on the following activities: development of treatment plan with patient and/or surrogate as well as nursing, discussions with consultants, evaluation of patient's response to treatment, examination of patient, obtaining history from patient or surrogate, ordering and performing treatments and interventions, ordering and review of laboratory studies, ordering and review of radiographic studies, pulse oximetry and re-evaluation of patient's condition.   Length of Stay: [address] DO  06/06/2023, 7:53 AM  Advanced Heart Failure Team Pager [phone] (M-F; 7a - 5p)  Please contact [facility] for night-coverage after hours (5p -7a ) and weekends on [email or web address]

## 2023-06-07 ENCOUNTER — Inpatient Hospital Stay (HOSPITAL_COMMUNITY): Payer: PPO

## 2023-06-07 DIAGNOSIS — J189 Pneumonia, unspecified organism: Secondary | ICD-10-CM | POA: Diagnosis not present

## 2023-06-07 DIAGNOSIS — J9601 Acute respiratory failure with hypoxia: Secondary | ICD-10-CM | POA: Diagnosis not present

## 2023-06-07 DIAGNOSIS — B371 Pulmonary candidiasis: Secondary | ICD-10-CM

## 2023-06-07 DIAGNOSIS — R570 Cardiogenic shock: Secondary | ICD-10-CM | POA: Diagnosis not present

## 2023-06-07 LAB — RENAL FUNCTION PANEL
Albumin: 2 g/dL — ABNORMAL LOW (ref 3.5–5.0)
Albumin: 2.1 g/dL — ABNORMAL LOW (ref 3.5–5.0)
Anion gap: 15 (ref 5–15)
Anion gap: 14 (ref 5–15)
BUN: 37 mg/dL — ABNORMAL HIGH (ref 8–23)
BUN: 40 mg/dL — ABNORMAL HIGH (ref 8–23)
CO2: 21 mmol/L — ABNORMAL LOW (ref 22–32)
CO2: 21 mmol/L — ABNORMAL LOW (ref 22–32)
Calcium: 8.5 mg/dL — ABNORMAL LOW (ref 8.9–10.3)
Calcium: 8.3 mg/dL — ABNORMAL LOW (ref 8.9–10.3)
Chloride: 98 mmol/L (ref 98–111)
Chloride: 98 mmol/L (ref 98–111)
Creatinine, Ser: 1.11 mg/dL (ref 0.61–1.24)
Creatinine, Ser: 1.18 mg/dL (ref 0.61–1.24)
GFR, Estimated: >60 mL/min (ref >60)
GFR, Estimated: >60 mL/min (ref >60)
Glucose, Bld: 176 mg/dL — ABNORMAL HIGH (ref 70–99)
Glucose, Bld: 296 mg/dL — ABNORMAL HIGH (ref 70–99)
Phosphorus: 3.5 mg/dL (ref 2.5–4.6)
Phosphorus: 4.6 mg/dL (ref 2.5–4.6)
Potassium: 4.1 mmol/L (ref 3.5–5.1)
Potassium: 4.6 mmol/L (ref 3.5–5.1)
Sodium: 134 mmol/L — ABNORMAL LOW (ref 135–145)
Sodium: 133 mmol/L — ABNORMAL LOW (ref 135–145)

## 2023-06-07 LAB — POCT I-STAT 7, (LYTES, BLD GAS, ICA,H+H)
Acid-base deficit: 1 mmol/L (ref 0.0–2.0)
Acid-base deficit: 1 mmol/L (ref 0.0–2.0)
Acid-base deficit: 1 mmol/L (ref 0.0–2.0)
Acid-base deficit: 2 mmol/L (ref 0.0–2.0)
Bicarbonate: 23.9 mmol/L (ref 20.0–28.0)
Bicarbonate: 22.7 mmol/L (ref 20.0–28.0)
Bicarbonate: 24.3 mmol/L (ref 20.0–28.0)
Bicarbonate: 22.7 mmol/L (ref 20.0–28.0)
Calcium, Ion: 1.17 mmol/L (ref 1.15–1.40)
Calcium, Ion: 1.15 mmol/L (ref 1.15–1.40)
Calcium, Ion: 1.1 mmol/L — ABNORMAL LOW (ref 1.15–1.40)
Calcium, Ion: 1.15 mmol/L (ref 1.15–1.40)
HCT: 26 % — ABNORMAL LOW (ref 39.0–52.0)
HCT: 25 % — ABNORMAL LOW (ref 39.0–52.0)
HCT: 25 % — ABNORMAL LOW (ref 39.0–52.0)
HCT: 26 % — ABNORMAL LOW (ref 39.0–52.0)
Hemoglobin: 8.8 g/dL — ABNORMAL LOW (ref 13.0–17.0)
Hemoglobin: 8.5 g/dL — ABNORMAL LOW (ref 13.0–17.0)
Hemoglobin: 8.5 g/dL — ABNORMAL LOW (ref 13.0–17.0)
Hemoglobin: 8.8 g/dL — ABNORMAL LOW (ref 13.0–17.0)
O2 Saturation: 100 %
O2 Saturation: 100 %
O2 Saturation: 99 %
O2 Saturation: 100 %
Patient temperature: 37
Patient temperature: 36.3
Patient temperature: 36.6
Patient temperature: 36.4
Potassium: 4.1 mmol/L (ref 3.5–5.1)
Potassium: 4.2 mmol/L (ref 3.5–5.1)
Potassium: 3.9 mmol/L (ref 3.5–5.1)
Potassium: 4.6 mmol/L (ref 3.5–5.1)
Sodium: 135 mmol/L (ref 135–145)
Sodium: 135 mmol/L (ref 135–145)
Sodium: 136 mmol/L (ref 135–145)
Sodium: 134 mmol/L — ABNORMAL LOW (ref 135–145)
TCO2: 25 mmol/L (ref 22–32)
TCO2: 24 mmol/L (ref 22–32)
TCO2: 26 mmol/L (ref 22–32)
TCO2: 24 mmol/L (ref 22–32)
pCO2 arterial: 39.3 mm[Hg] (ref 32–48)
pCO2 arterial: 33.6 mm[Hg] (ref 32–48)
pCO2 arterial: 43.4 mm[Hg] (ref 32–48)
pCO2 arterial: 38.3 mm[Hg] (ref 32–48)
pH, Arterial: 7.392 (ref 7.35–7.45)
pH, Arterial: 7.435 (ref 7.35–7.45)
pH, Arterial: 7.354 (ref 7.35–7.45)
pH, Arterial: 7.377 (ref 7.35–7.45)
pO2, Arterial: 170 mm[Hg] — ABNORMAL HIGH (ref 83–108)
pO2, Arterial: 186 mm[Hg] — ABNORMAL HIGH (ref 83–108)
pO2, Arterial: 148 mm[Hg] — ABNORMAL HIGH (ref 83–108)
pO2, Arterial: 207 mm[Hg] — ABNORMAL HIGH (ref 83–108)

## 2023-06-07 LAB — APTT
aPTT: 46 s — ABNORMAL HIGH (ref 24–36)
aPTT: 50 s — ABNORMAL HIGH (ref 24–36)

## 2023-06-07 LAB — CBC
HCT: 23.9 % — ABNORMAL LOW (ref 39.0–52.0)
HCT: 22.7 % — ABNORMAL LOW (ref 39.0–52.0)
Hemoglobin: 8 g/dL — ABNORMAL LOW (ref 13.0–17.0)
Hemoglobin: 7.9 g/dL — ABNORMAL LOW (ref 13.0–17.0)
MCH: 30.8 pg (ref 26.0–34.0)
MCH: 31.7 pg (ref 26.0–34.0)
MCHC: 33.5 g/dL (ref 30.0–36.0)
MCHC: 34.8 g/dL (ref 30.0–36.0)
MCV: 91.9 fL (ref 80.0–100.0)
MCV: 91.2 fL (ref 80.0–100.0)
Platelets: 30 10*3/uL — ABNORMAL LOW (ref 150–400)
Platelets: 86 10*3/uL — ABNORMAL LOW (ref 150–400)
RBC: 2.6 MIL/uL — ABNORMAL LOW (ref 4.22–5.81)
RBC: 2.49 MIL/uL — ABNORMAL LOW (ref 4.22–5.81)
RDW: 17.4 % — ABNORMAL HIGH (ref 11.5–15.5)
RDW: 17.4 % — ABNORMAL HIGH (ref 11.5–15.5)
WBC: 52.4 10*3/uL (ref 4.0–10.5)
WBC: 50 10*3/uL — ABNORMAL HIGH (ref 4.0–10.5)
nRBC: 1 % — ABNORMAL HIGH (ref 0.0–0.2)
nRBC: 0.7 % — ABNORMAL HIGH (ref 0.0–0.2)

## 2023-06-07 LAB — PROTIME-INR
INR: 1.5 — ABNORMAL HIGH (ref 0.8–1.2)
Prothrombin Time: 18.2 s — ABNORMAL HIGH (ref 11.4–15.2)

## 2023-06-07 LAB — FIBRINOGEN: Fibrinogen: 576 mg/dL — ABNORMAL HIGH (ref 210–475)

## 2023-06-07 LAB — CG4 I-STAT (LACTIC ACID): Lactic Acid, Venous: 1.7 mmol/L (ref 0.5–1.9)

## 2023-06-07 LAB — HEPATIC FUNCTION PANEL
ALT: 14 U/L (ref 0–44)
AST: 109 U/L — ABNORMAL HIGH (ref 15–41)
Albumin: 2 g/dL — ABNORMAL LOW (ref 3.5–5.0)
Alkaline Phosphatase: 131 U/L — ABNORMAL HIGH (ref 38–126)
Bilirubin, Direct: 2.4 mg/dL — ABNORMAL HIGH (ref 0.0–0.2)
Indirect Bilirubin: 1.6 mg/dL — ABNORMAL HIGH (ref 0.3–0.9)
Total Bilirubin: 4 mg/dL — ABNORMAL HIGH (ref 0.3–1.2)
Total Protein: 8 g/dL (ref 6.5–8.1)

## 2023-06-07 LAB — FUNGAL ORGANISM REFLEX

## 2023-06-07 LAB — FUNGUS CULTURE WITH STAIN

## 2023-06-07 LAB — TOXOPLASMA ANTIBODIES- IGG AND  IGM
Toxoplasma Antibody- IgM: <3 [AU]/mL (ref 0.0–7.9)
Toxoplasma IgG Ratio: <3 [IU]/mL (ref 0.0–7.1)

## 2023-06-07 LAB — MAGNESIUM: Magnesium: 2.4 mg/dL (ref 1.7–2.4)

## 2023-06-07 LAB — GLUCOSE, CAPILLARY
Glucose-Capillary: 169 mg/dL — ABNORMAL HIGH (ref 70–99)
Glucose-Capillary: 177 mg/dL — ABNORMAL HIGH (ref 70–99)
Glucose-Capillary: 198 mg/dL — ABNORMAL HIGH (ref 70–99)
Glucose-Capillary: 277 mg/dL — ABNORMAL HIGH (ref 70–99)
Glucose-Capillary: 234 mg/dL — ABNORMAL HIGH (ref 70–99)

## 2023-06-07 LAB — FUNGUS CULTURE RESULT

## 2023-06-07 LAB — SEDIMENTATION RATE: Sed Rate: >140 mm/h — ABNORMAL HIGH (ref 0–16)

## 2023-06-07 LAB — LACTATE DEHYDROGENASE: LDH: 908 U/L — ABNORMAL HIGH (ref 98–192)

## 2023-06-07 MED ORDER — TRACE MINERALS CU-MN-SE-ZN 300-55-60-3000 MCG/ML IV SOLN
INTRAVENOUS | Status: AC
  Filled 2023-06-07: qty 936

## 2023-06-07 MED ORDER — LIDOCAINE-EPINEPHRINE 1 %-1:100000 IJ SOLN
20.0000 mL | Freq: Once | INTRAMUSCULAR | Status: AC
  Administered 2023-06-07: 20 mL via INTRADERMAL
  Filled 2023-06-07: qty 1

## 2023-06-07 MED ORDER — SODIUM CHLORIDE 0.9 % IV SOLN
200.0000 mg | INTRAVENOUS | Status: DC
  Administered 2023-06-07 – 2023-06-11 (×5): 200 mg via INTRAVENOUS
  Filled 2023-06-07 (×6): qty 10

## 2023-06-07 MED ORDER — METHYLPREDNISOLONE SODIUM SUCC 125 MG IJ SOLR
120.0000 mg | INTRAMUSCULAR | Status: AC
  Administered 2023-06-07 – 2023-06-09 (×3): 120 mg via INTRAVENOUS
  Filled 2023-06-07 (×3): qty 2

## 2023-06-07 MED ORDER — ROCURONIUM BROMIDE 10 MG/ML (PF) SYRINGE
100.0000 mg | PREFILLED_SYRINGE | Freq: Once | INTRAVENOUS | Status: AC
  Administered 2023-06-07: 70 mg via INTRAVENOUS
  Filled 2023-06-07: qty 10

## 2023-06-07 MED ORDER — MIDAZOLAM HCL 2 MG/2ML IJ SOLN
5.0000 mg | Freq: Once | INTRAMUSCULAR | Status: AC
  Administered 2023-06-07: 5 mg via INTRAVENOUS
  Filled 2023-06-07: qty 6

## 2023-06-07 MED ORDER — INSULIN DETEMIR 100 UNIT/ML ~~LOC~~ SOLN
20.0000 [IU] | Freq: Every day | SUBCUTANEOUS | Status: DC
  Filled 2023-06-07: qty 0.2

## 2023-06-07 MED ORDER — SODIUM CHLORIDE 0.9% IV SOLUTION: Freq: Once | INTRAVENOUS | Status: DC

## 2023-06-07 MED ORDER — MIDAZOLAM HCL 2 MG/2ML IJ SOLN
4.0000 mg | Freq: Once | INTRAMUSCULAR | Status: AC
  Administered 2023-06-07: 4 mg via INTRAVENOUS
  Filled 2023-06-07: qty 4

## 2023-06-07 MED ORDER — ETOMIDATE 2 MG/ML IV SOLN
20.0000 mg | Freq: Once | INTRAVENOUS | Status: AC
  Administered 2023-06-07: 20 mg via INTRAVENOUS
  Filled 2023-06-07: qty 10

## 2023-06-07 MED ORDER — INSULIN DETEMIR 100 UNIT/ML ~~LOC~~ SOLN
25.0000 [IU] | Freq: Every day | SUBCUTANEOUS | Status: DC
  Filled 2023-06-07: qty 0.25

## 2023-06-07 MED ORDER — FENTANYL CITRATE PF 50 MCG/ML IJ SOSY
200.0000 ug | PREFILLED_SYRINGE | Freq: Once | INTRAMUSCULAR | Status: AC
  Administered 2023-06-07: 200 ug via INTRAVENOUS
  Filled 2023-06-07: qty 4

## 2023-06-07 NOTE — Progress Notes (Signed)
Patient ID: [name], male   DOB: 1960-10-04, 62 y.o.   MRN: [id number]     Advanced Heart Failure Rounding Note  PCP-Cardiologist: Kristeen Miss, MD   Subjective:    05/25/23: OR for Bentall/AVR, TV repair, re-implantation of SVG-RCA.  Post-op vasoplegia and pulmonary edema, required central VA ECMO (RA drainage, ascending aorta return.  Post-op TEE EF 55%, mild-moderate RV hypokinesis.  9/14: Blender added to circuit due to high PaO2 9/16: TEE with EF 30%, moderate LVH, moderate RV dysfunction with mild enlargement, stable bioprosthetic aortic valve, repaired TV with mild TR, no endocarditis noted.  9/18: Bronchoscopy with mucus plugs and clots. 9/19: To OR for mediastinal washout.  By TEE, EF 50% with moderate LVH, mild RV enlargement and mild RV dysfunction, stable bioprosthetic aortic valve, repaired TV with mild TR.  He did not tolerate lowering of ECMO flows even with increasing of pressors due to vasoplegia.  9/21: Bronchoscopy  BAL: growing candida and dold 9/24: Decannulated from [facility] ECMO. Chest closed. Transitioned to VV ECMO  On VV ECMO. Circuit stable.   - Negative 500cc over the past 24h with no significant change in pulmonary infiltrates.  - Rising pressor requirement; vasopressin added on overnight. WBC ct continues to rise. ] - Responsive now to verbal command  Objective:   Weight Range: 76.2 kg Body mass index is 27.11 kg/m.   Vital Signs:   Temp:  [96.6 F (35.9 C)-97.9 F (36.6 C)] 97.5 F (36.4 C) (10/01 0700) Pulse Rate:  [74-84] 84 (10/01 0700) Resp:  [0-37] 0 (10/01 0700) BP: (82-116)/(55-69) 104/61 (10/01 0301) SpO2:  [100 %] 100 % (10/01 0700) Arterial Line BP: (83-122)/(22-87) 99/61 (10/01 0700) FiO2 (%):  [40 %] 40 % (10/01 0301) Weight:  [76.2 kg] 76.2 kg (10/01 0500) Last BM Date : 06/05/23  Weight change: Filed Weights   06/05/23 0500 06/06/23 0500 06/07/23 0500  Weight: 78.8 kg 76.9 kg 76.2 kg    Intake/Output:   Intake/Output Summary  (Last 24 hours) at 06/07/2023 0754 Last data filed at 06/07/2023 0700 Gross per 24 hour  Intake 3653.34 ml  Output 4109.6 ml  Net -456.26 ml      Physical Exam    General:  Critically-ill appearing. On vent.  HEENT: + ETT Neck:  +VV ECMO cannula + swan Cor: Chest dressing. + CTs   Lungs: minimal but improving lung sounds on right (minimal); rales/coarse on the left Abdomen: soft; nontender.  Extremities: no cyanosis, clubbing, rash, no edema Neuro: intubated/sedated  Telemetry   Junctional rhythm + PVCs Personally reviewed  Labs    CBC Recent Labs    06/06/23 1617 06/06/23 1627 06/07/23 0351 06/07/23 0354  WBC 45.4*  --   --  52.4*  HGB 8.1*   < > 8.8* 8.0*  HCT 24.2*   < > 26.0* 23.9*  MCV 89.0  --   --  91.9  PLT 29*  --   --  30*   < > = values in this interval not displayed.   Basic Metabolic Panel Recent Labs    40/98/11 0415 06/06/23 0646 06/06/23 1617 06/06/23 1627 06/07/23 0351 06/07/23 0354  NA 134*   < > 133*   < > 135 134*  K 4.1   < > 4.1   < > 4.1 4.1  CL 99  --  101  --   --  98  CO2 22  --  20*  --   --  21*  GLUCOSE 200*  --  191*  --   --  176*  BUN 37*  --  42*  --   --  37*  CREATININE 1.23  --  1.27*  --   --  1.11  CALCIUM 8.7*  --  8.2*  --   --  8.5*  MG 2.4  --   --   --   --  2.4  PHOS 3.3  --  3.4  --   --  3.5   < > = values in this interval not displayed.   Liver Function Tests Recent Labs    06/06/23 0415 06/06/23 1617 06/07/23 0354  AST 126*  --  109*  ALT 17  --  14  ALKPHOS 122  --  131*  BILITOT 4.3*  --  4.0*  PROT 8.4*  --  8.0  ALBUMIN 2.0* 2.0* 2.0*  2.0*   BNP: BNP (last 3 results) Recent Labs    06/02/2023 1100  BNP 2,097.0*     Imaging    No results found.   Medications:     Scheduled Medications:  artificial tears   Both Eyes Q8H   bisacodyl  10 mg Rectal Daily   Chlorhexidine Gluconate Cloth  6 each Topical Daily   dextrose  10 mL Intravenous Q24H   dextrose  10 mL Intravenous Q24H    insulin aspart  0-20 Units Subcutaneous Q4H   insulin aspart  5 Units Subcutaneous Q4H   insulin detemir  20 Units Subcutaneous BID   methylPREDNISolone (SOLU-MEDROL) injection  40 mg Intravenous Q8H   mouth rinse  15 mL Mouth Rinse Q2H   pantoprazole (PROTONIX) IV  40 mg Intravenous QHS   sodium chloride  250 mL Intravenous Q24H   sodium chloride  250 mL Intravenous Q24H   sodium chloride flush  3 mL Intravenous Q12H    Infusions:   prismasol BGK 4/2.5 400 mL/hr at 06/07/23 0327    prismasol BGK 4/2.5 400 mL/hr at 06/07/23 0523   sodium chloride Stopped (05/24/23 0032)   sodium chloride Stopped (06/06/23 0237)   albumin human Stopped (05/30/23 0819)   amphotericin B liposome (AMBISOME) 240 mg in dextrose 5 % 500 mL IVPB Stopped (06/06/23 1839)   anticoagulant sodium citrate     bivalirudin (ANGIOMAX) 250 mg in sodium chloride 0.9 % 500 mL (0.5 mg/mL) infusion 0.01 mg/kg/hr (06/07/23 0700)    ceFAZolin (ANCEF) IV Stopped (06/06/23 2210)   epinephrine 8 mcg/min (06/07/23 0700)   norepinephrine (LEVOPHED) Adult infusion Stopped (06/01/23 1405)   prismasol BGK 4/2.5 1,500 mL/hr at 06/07/23 0617   TPN ADULT (ION) 75 mL/hr at 06/07/23 0700   vasopressin 0.05 Units/min (06/07/23 0700)    PRN Medications: sodium chloride, sodium chloride, acetaminophen, acetaminophen, albumin human, anticoagulant sodium citrate, artificial tears, dextrose, diphenhydrAMINE **OR** diphenhydrAMINE, HYDROmorphone (DILAUDID) injection, meperidine (DEMEROL) injection, midazolam, mouth rinse, oxidized cellulose, sodium chloride flush    Assessment/Plan   1. Post-cardiotomy vasoplegia/shock with failure to wean from CPB: Now on central VA ECMO (05/10/2023) with RA drainage/ascending aorta return. Post-op echo with EF 55%, mild-moderate RV dysfunction. Unable to get images yesterday by TTE.  TEE on 9/16 showed EF 30%, moderate LVH, moderate RV dysfunction with mild enlargement, stable bioprosthetic aortic valve,  repaired TV with mild TR, no endocarditis noted.  To OR for mediastinal washout on 9/19.  By TEE 9/19, EF 50% with moderate LVH, mild RV enlargement and mild RV dysfunction, stable bioprosthetic aortic valve, repaired TV with mild TR.  He did not tolerate  lowering of ECMO flows even with increasing of pressors due to vasoplegia. CXR with clearing left lung, right lung with persistent airspace disease not clearing with CVVH. Switch from [place] ECMO to VV ECMO with chest closure on 05/16/2023 - Epinephrine 7-51mcg; vasopressin added overnight.  negative 500cc over the past 24h.  - Improving neuro response; intermittently responsive now.  - Will continue to pull slightly net negative on CVVHD - LDH stable. Continue bival. Discussed dosing with PharmD personally. - Prognosis remains guarded.   2. Acute hypoxic respiratory failure, post-op: On vent, rest settings. Blender added to keep PaO2 down. Sweep remains 4.5. CXR with some clearing of left lung, right lung with ongoing diffuse airspace disease without change.  Suspect extensive PNA, think we have done what we can with CVVH for lung clearing at this point. Bronchoscopy yesterday with no change in CXR.  - Now on VV ECMO  - R lung consolidation is severe. BAL from 9/20 + candida and mold - Meropenem/vancomycin/micafungin/amphotericin for coverage of PNA - CT chest with extensive infiltrates. - Plan for trach today - D/w CCM  3. Severe prosthetic AI: Due to previous MSSA endocarditis with partial valve dehiscence s/p re-do AVR/Bentall with reimplantation of SVG-RCA and TV repair 05/13/2023.   - Continuing meropenem/vancomycin/micafungin/amphotericin.  4. CAD s/p CABG/AVR 2017:  s/p AVR/Bentall and re-implantation of SVG-RCA 06/01/2023. - Off ASA with platelets down.   5. ESRD: CVVH as above. Keep negative  6. DM2: management per CCM  7. Heart block: Post-op, now in stable junctional rhythm.    8. H/o CVA: Prior cerebellar CVA.   9. Thrombocytopenia:  monitoring. Suspect inflammatory and hemolysis.   10. Acute blood loss anemia: Post-op, transfuse hgb < 8.   11. ID: H/o MSSA bacteremia, respiratory cultures with mold.    - Meropenem + vancomycin - Micafungin for fungal coverage added 9/20 - Started amphotericin 06/04/23 - WBC ct continuing to rise.   12. FEN: Receiving Tfs  13. Liver failure, post-op  CRITICAL CARE Performed by: Dorthula Nettles   Total critical care time: 40 minutes  Critical care time was exclusive of separately billable procedures and treating other patients.  Critical care was necessary to treat or prevent imminent or life-threatening deterioration.  Critical care was time spent personally by me on the following activities: development of treatment plan with patient and/or surrogate as well as nursing, discussions with consultants, evaluation of patient's response to treatment, examination of patient, obtaining history from patient or surrogate, ordering and performing treatments and interventions, ordering and review of laboratory studies, ordering and review of radiographic studies, pulse oximetry and re-evaluation of patient's condition.   Length of Stay: 20  Elianys Conry, DO  06/07/2023, 7:54 AM  Advanced Heart Failure Team Pager [phone] (M-F; 7a - 5p)  Please contact [facility] for night-coverage after hours (5p -7a ) and weekends on [email or web address]

## 2023-06-07 NOTE — Progress Notes (Addendum)
[facility]    Date of Admission:  05/30/2023     ID: [name] is a 62 y.o. male with  Principal Problem:   Cardiogenic shock (HCC) Active Problems:   CAD (coronary artery disease)   Type 2 diabetes mellitus without complication, without long-term current use of insulin (HCC)   OSA on CPAP   Symptomatic anemia   Acute on chronic congestive heart failure (HCC)   Prosthetic aortic valve failure   Acute respiratory failure with hypoxia (HCC)   Anemia   Protein-calorie malnutrition, severe    Subjective: Afebrile, underwent tracheostomy. Underwent repeat bronch due to increased secretions   Medications:   sodium chloride   Intravenous Once   artificial tears   Both Eyes Q8H   bisacodyl  10 mg Rectal Daily   Chlorhexidine Gluconate Cloth  6 each Topical Daily   dextrose  10 mL Intravenous Q24H   dextrose  10 mL Intravenous Q24H   insulin aspart  0-20 Units Subcutaneous Q4H   insulin aspart  5 Units Subcutaneous Q4H   [START ON 06/08/2023] insulin detemir  20 Units Subcutaneous QHS   [START ON 06/08/2023] insulin detemir  25 Units Subcutaneous Daily   methylPREDNISolone (SOLU-MEDROL) injection  120 mg Intravenous Q24H   mouth rinse  15 mL Mouth Rinse Q2H   pantoprazole (PROTONIX) IV  40 mg Intravenous QHS   sodium chloride  250 mL Intravenous Q24H   sodium chloride  250 mL Intravenous Q24H   sodium chloride flush  3 mL Intravenous Q12H    Objective: Vital signs in last 24 hours: Temp:  [97.3 F (36.3 C)-98.2 F (36.8 C)] 98.2 F (36.8 C) (10/01 1200) Pulse Rate:  [73-94] 73 (10/01 1400) Resp:  [0-35] 0 (10/01 1400) BP: (82-114)/(55-69) 104/61 (10/01 0301) SpO2:  [100 %] 100 % (10/01 1400) Arterial Line BP: (81-119)/(46-87) 111/63 (10/01 1400) FiO2 (%):  [40 %-100 %] 60 % (10/01 1154) Weight:  [76.2 kg] 76.2 kg (10/01 0500)  Physical Exam  Constitutional: He is oriented to person, place, and time. He appears well-developed and  well-nourished. No distress.  HENT: tracheostomy + Mouth/Throat: Oropharynx is clear and moist. No oropharyngeal exudate.  Cardiovascular: Normal rate, regular rhythm and normal heart sounds. Exam reveals no gallop and no friction rub.  No murmur heard. Lines in place are c/d/i Pulmonary/Chest: Effort normal and breath sounds normal. No respiratory distress. He has no wheezes.  Abdominal: Soft. Bowel sounds are normal. He exhibits no distension. There is no tenderness.  Lymphadenopathy:  He has no cervical adenopathy.  Neurological: He is alert and oriented to person, place, and time.  Skin: Skin is warm and dry. No rash noted. No erythema.  Ext: dry gangrenous changes to toes bilaterally Psychiatric: He has a normal mood and affect. His behavior is normal.    Lab Results Recent Labs    06/06/23 1617 06/06/23 1627 06/07/23 0354 06/07/23 0727 06/07/23 1009  WBC 45.4*  --  52.4*  --   --   HGB 8.1*   < > 8.0* 8.5* 8.5*  HCT 24.2*   < > 23.9* 25.0* 25.0*  NA 133*   < > 134* 135 136  K 4.1   < > 4.1 4.2 3.9  CL 101  --  98  --   --   CO2 20*  --  21*  --   --   BUN 42*  --  37*  --   --   CREATININE 1.27*  --  1.11  --   --    < > = values in this interval not displayed.   Liver Panel Recent Labs    06/05/23 0346 06/05/23 1621 06/06/23 0415 06/06/23 1617 06/07/23 0354  PROT 7.7  --  8.4*  --  8.0  ALBUMIN 2.0*  2.0*   < > 2.0* 2.0* 2.0*  2.0*  AST 116*  --  126*  --  109*  ALT 17  --  17  --  14  ALKPHOS 92  --  122  --  131*  BILITOT 5.2*  --  4.3*  --  4.0*  BILIDIR 3.0*  --  2.7*  --  2.4*  IBILI 2.2*  --   --   --  1.6*   < > = values in this interval not displayed.   Sedimentation Rate Recent Labs    06/07/23 0800  ESRSEDRATE >140*    Microbiology: ------------------ Studies/Results: DG CHEST PORT 1 VIEW  Result Date: 06/07/2023 CLINICAL DATA:  ECMO. EXAM: PORTABLE CHEST 1 VIEW COMPARISON:  Radiograph yesterday FINDINGS: Stable support apparatus  including endotracheal tube, enteric tube, right-sided dialysis catheter, left central line, right-sided ECMO catheter, bilateral chest tubes. Heterogeneous bilateral lung opacities with equivocal improvement. Suspected small pleural effusions. No visible pneumothorax. IMPRESSION: 1. Stable support apparatus. 2. Heterogeneous bilateral lung opacities with equivocal improvement. Suspected small pleural effusions. Electronically Signed   By: Narda Rutherford M.D.   On: 06/07/2023 10:38   DG CHEST PORT 1 VIEW  Result Date: 06/06/2023 CLINICAL DATA:  ECMO. EXAM: PORTABLE CHEST 1 VIEW COMPARISON:  Radiograph yesterday FINDINGS: Stable support apparatus including endotracheal tube, enteric tube, left subclavian central line, right internal jugular dialysis catheter and large bore ECMO catheter, bilateral chest tubes and mediastinal drain. Stable lung volumes. Heterogeneous bilateral lung opacities without significant interval change, worse on the right. Small bilateral pleural effusions. No visible pneumothorax. IMPRESSION: 1. Stable support apparatus. 2. Heterogeneous bilateral lung opacities without significant interval change, worse on the right. 3. Small bilateral pleural effusions. Electronically Signed   By: Narda Rutherford M.D.   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 Awaiting micro isolation and identification.   Hx of mssa PVE = on cefazolin but also would cover cellulitis should ischemic changes become infected.  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 we will d/c ampho as this won't be useful. Micafungin would cover both aspergillus and candida  [facility] Pager: [phone]  06/07/2023, 3:31 PM

## 2023-06-07 NOTE — Progress Notes (Signed)
PHARMACY - TOTAL PARENTERAL NUTRITION CONSULT NOTE   Indication:  unable to tolerate tube feeding  Patient Measurements: Height: 5\' 6"  (167.6 cm) Weight: 76.2 kg (167 lb 15.9 oz) IBW/kg (Calculated) : 63.8 TPN AdjBW (KG): 69 Body mass index is 27.11 kg/m. Usual Weight: ~90kg  Assessment: 62 yo male with significant PMH for HLD, HTN, T2DM, CKD4 (prev on HD) and recent MSSA bacteremia + endocarditis (May 2024). Back in May 2024 admission, underwent ERCP for cholangitis/pancreatitis. Admitted on 9/11 w/ chest pain found to have aortic valve dehiscence. S/p aortic valve replacement redo on 9/13 - developed pulmonary edema post op and was placed on [place] ECMO, transitioned to VV ECMO 06/20/23. S/p mediastinal washout on 9/19 - chest still open. Unable to speak w/ patient due to intubation (utilizing Precedex for sedation). Pharmacy consulted to manage TPN for inability to tolerate tube feeding and malnutrition.   Glucose / Insulin: hx T2DM - A1c 6.1%, on Farxiga PTA. BG 162-224. Used 57 units rSSI used in 24 hours. Detemir increase to 20u BID   -Note patient is receiving Amphotericin B in dextrose 500 mL + D5 flushes before and after infusion (drug not compatible in other base fluid) -10/1 starting steroid course, team to adjust insulin outside TPN  Electrolytes: Na 134, K 4.1 - stable (none in TPN), CO2 21, Mg 2.4 - trending up (none in TPN), CoCa 10.3 (upper end of normal) Renal: Hx CKD4 on CRRT since 9/11(required HD in the past), 4K bags, systemic bivalirudin for anticoag - not using systemic sodium citrate infusion, only for catheter lock per d/w RN at bedside on 20-Jun-2023 Hepatic: Alk phos 131, AST 109- trending down, ALT wnl, Tbili down 4, albumin 2, TG 188  Intake / Output:  - other output - Stool 66 mL, receiving Reglan 5mg  IV q6h - Chest tube 10 mL - CRRT removed 3884 mL MIVF: NS @ KVO GI Imaging:  9/27 CT A/P : mild wall thickening in several small bowel loops suggesting enteritis,  cholelithiasis GI Surgeries / Procedures:  06-20-2023 transition to VV ECMO 9/20 Cortrak placed  9/22 NGT placed  Central access: 9/13 TPN start date: 9/23  Nutritional Goals: Goal concentrated TPN rate is 75 mL/hr (provides 140 g of protein and 2255 kcals per day)  RD Assessment: Estimated Needs Total Energy Estimated Needs: 2200-2400 Total Protein Estimated Needs: 130-150 grams Total Fluid Estimated Needs: 1L plus UOP  Current Nutrition:  NPO and TPN  Plan:  Continue concentrated TPN at 75 mL/hr at 1800 - meeting ~100% of needs Electrolytes in TPN: increase Na 125 mEq/L, K 0 mEq/L, Ca 0 mEq/L, Mg 0 mEq/L, and Phos 20 mmol/L. Change Cl:Acet to 1:2  Standard MVI and trace elements within TPN No chromium (on CRRT and currently on shortage) Continue Resistant q4h SSI + aspart 5 units q4h and insulin detemir 20 units SQ BID - Adjust per team given steroid course   Continue MIVF at [facility] Monitor TPN labs on Mon/Thurs, RFP BID while on CRRT Watch Mag and K closely while on ampho B  Alphia Moh, PharmD, BCPS, BCCP Clinical Pharmacist  Please check AMION for all [facility] Pharm[facility] phone numbers After 10:00 PM, call Main Pharmacy [phone]

## 2023-06-07 NOTE — Progress Notes (Signed)
NAME:  [name], MRN:  [id number], DOB:  1960/09/12, LOS: 20 ADMISSION DATE:  05/15/2023, CONSULTATION DATE:  9/11 REFERRING MD:  Dr. Izora Ribas, CHIEF COMPLAINT:  aortic valve dehiscence   History of Present Illness:  Patient is [name] w/ pertinent PMH CAD s/p CABG 2017 w/ AVR, HLD, HTN, prior CVA, T2DM, CKD4 was previously on HD presents to [facility] on 9/10 chest pain.  Patient recently admitted to [facility] on 5/25 w/ AKI and AMS. While in ED patient had PEA arrest w/ ROSC in about 8 minutes. Patient required dialysis on this admission. Also w/ MSSA bacteremia associated w/ tricuspid valve endocarditis. Patient transferred to [facility] on 5/25. Treated w/ 6 week course oxacillin and rifampin. This admission also suffered a R cerebellar CVA w/ MRI likely septic emboli and had cholangitis/pancreatitis requiring ERCP.  On 9/10 patient admitted to [facility] w/ chest pain, dyspnea, and BLE edema. BNP 2,097. CXR w/ pulm edema. Patient started on IV lasix infusion. Cards consulted. On 9/11 patient transferred to [facility] Patient's echo showing possible aortic valve dehiscence. Cultures repeated and started on rocephin. Patient transferred to ICU for TEE and general anesthesia and to start CRRT. PCCM consulted.  Pertinent  Medical History   Past Medical History:  Diagnosis Date   Allergy    Anemia    Anxiety    Baker's cyst of knee    Blood transfusion without reported diagnosis    as baby    Coronary artery disease    quadruple bypass - March 2016   GERD (gastroesophageal reflux disease)    Gouty arthritis    "real bad" (01/17/2013)   Heart murmur    Hypercholesteremia    Hypertension    MSSA bacteremia 01/29/2023   Myocardial infarction (HCC) 2017   PEA (Pulseless electrical activity) (HCC) 01/29/2023   Stroke (HCC)    Type II diabetes mellitus (HCC)     Significant Hospital Events: Including procedures, antibiotic start and stop dates in addition to other pertinent events    9/10 admitted 9/11 echo showing aortic valve dehiscence, confirmed by TEE.  9/11 started (back) on CRRT for volume overload and known CKD 4 9/12 breathing better after CRRT 9/13 to OR for Redo of aortic valve, tricuspid valve repair and ascending aortic root replacement w/ re-implantation of SVG to RCA. Post op TEE EF 55% RV mid-mod HK, AVR/TV repair stable. Could not come off CPB. Worse pulmonary edema. High dose pressors. Cannulated and started on [place] ECMO w/ central cannulation  9/16 MDT ECMO rounds this AM, plans to remove more volume, 1 U PRBCs  9/18 bronch, clot and mucus plugs removed  9/19 OR for washout  9/20 bronch 9/21 bronch 9/24 VA to VV, some bleeding and acidemia issues postop 9/25 ett exchange and bronch  Interim History / Subjective:  No events. Following commands per nursing.  Objective   Blood pressure 104/61, pulse 84, temperature (!) 97.5 F (36.4 C), resp. rate (!) 0, height 5\' 6"  (1.676 m), weight 76.2 kg, SpO2 100%. CVP:  [7 mmHg-61 mmHg] 13 mmHg  Vent Mode: PRVC FiO2 (%):  [40 %] 40 % Set Rate:  [16 bmp-26 bmp] 22 bmp Vt Set:  [250 mL] 250 mL PEEP:  [10 cmH20] 10 cmH20 Plateau Pressure:  [29 cmH20] 29 cmH20   Intake/Output Summary (Last 24 hours) at 06/07/2023 0751 Last data filed at 06/07/2023 0700 Gross per 24 hour  Intake 3653.34 ml  Output 4109.6 ml  Net -456.26 ml  Filed Weights   06/05/23 0500 06/06/23 0500 06/07/23 0500  Weight: 78.8 kg 76.9 kg 76.2 kg    Examination: No distress Pupils equal Ext warm Sternotomy site dressed without strikethrough Still no bowel sounds Lungs with rhonci worse on right DP 16, MV 7, 6LPM sweep Follows commands per nursing  WBC up BMP stable Bili improving CXR stable ARDS, maybe some improved RUL aeration  Assessment & Plan:  Post cardiotomy vasoplegia/cardioplegia w/ failure to wean from CPB- improved; 2023-06-18 converted to VV Acute on chronic combined HF, Hx MSSA endocarditis- S/p AVR/Bentall and  reimplantation of Coronaries and TV repair 9/13 Acute respiratory failure w/ hypoxia due to volume overload, with untreated OSA, and dense nonresolving R infiltrate CKD 4. Had recently been on HD- now on CRRT Circuit related hemolysis, thrombocytopenia consumptive- stable Hyperbilirubinemia TF intolerance- ongoing issue now on TPN CAD s/p CABG in 2017 w/ AVR HTN HLD Hx of CVA DMT2 Leukocytosis, multifactorial   - On ancef for presumed lingering IE - Amphotericin added for mold in BAL given risk/benefits - Add steroids in case this is some odd COP equivalent (plus already covered for antifungals at this point) - Tracheostomy today with 2 units plts - Lack of improvement in vent needs is concerning, if does not respond to steroids not sure what else we can offer - Bival for [facility] - Sweep for pH 7.35-7.45 - Vasopressin for MAP 65 - Continue TPN while we await return of bowel function - Sedation PRN - Discussed above with AHF+TCTS+ECMO specialists   Best Practice (right click and "Reselect all SmartList Selections" daily)   Diet/type: TPN DVT prophylaxis: bival GI prophylaxis: PPI Lines: Central line, Dialysis Catheter, and Arterial Line Foley: yes Code Status:  full code Last date of multidisciplinary goals of care discussion [per TCTS and AHF]   This patient is critically ill with multiple organ system failure which requires frequent high complexity decision making, assessment, support, evaluation, and titration of therapies. This was completed through the application of advanced monitoring technologies and extensive interpretation of multiple databases. During this encounter critical care time was devoted to patient care services described in this note for 38 minutes.  Lorin Glass, MD 06/07/23 7:51 AM [facility]  For contact information, see Amion. If no response to pager, please call PCCM consult pager. After hours, 7PM- 7AM, please call [facility].

## 2023-06-07 NOTE — Progress Notes (Signed)
Nutrition Follow-up  DOCUMENTATION CODES:   Severe malnutrition in context of chronic illness  INTERVENTION:   TPN to meet 100% nutritional needs  Hopefully will be able to trial trickle TF soon; Recommend considering replacing Cortrak and attempting for post pyloric position  NUTRITION DIAGNOSIS:   Severe Malnutrition related to chronic illness as evidenced by severe muscle depletion, moderate fat depletion, edema, severe fat depletion, moderate muscle depletion.  Being addressed via TPN  GOAL:   Patient will meet greater than or equal to 90% of their needs  Being addressed via TPN  MONITOR:   Vent status, Labs, Weight trends, TF tolerance, Skin, I & O's, TPN  REASON FOR ASSESSMENT:   Consult New TPN/TNA  ASSESSMENT:   62 year old male with PMHx of CAD s/p CABG 2017 with AVR, HLD, HTN, prior CVA, T2DM, CKD 4 was previously on HD. Recent admission on 5/25 with AKI and AMS, had PEA arrest with ROSC in 8 minutes, required dialysis, also with MSSA bacteremia associated with tricuspid valve endocarditis, suffered right cerebellar CVA and had cholangitis/pancreatitis requiring ERCP. 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  9/10: Admitted 9/11 Echo showing aortic valve dehiscence, confirmed by TEE; CRRT started for volume overload and known CKD 4 9/13 OR for redo of aortic valve, tricuspid valve repair and ascending aortic root replacement with re-implantation of SVG to RCA; could not come off CPB, cannulated and started on VA ECMO with central cannulation 9/16 TEE with EF 30%, moderate LVH, moderate RV dysfunction with mild enlargement, no endocarditis 9/18 Bronch this AM, TF held due to bilious output from mouth reported 2023/06/02 Mediastinal washout, OG decompression, TF remain on hold 9/20 Bronch: copious secretions in R (pic in chart), mild irritation; abdomen soft, +stool, Cortrak placed, trickle initiated 9/20 Pivot 1.5 at 20  restarted 9/21 Chest xray worse-dense consolidation RL, Bronch performed with ongoing heavy mucopurulent secretions on right,  Remains on trickles, no BM 9/22 TF at trickles with 1L stool but later held and OG to suction with 2L removed 9/23 TPN initiated 9/24 OR for transition from [place] to VV ECMO 10/01 Trach placed  Pt remains on vent support, trach placed today Remains on CRRT Vasopressin at 0.04, Levo started at 7 but off epinephrine  TPN at 75 ml/hr providing 2225 kcals and 140 g of protein; pt receiving standard MVI and trace elements except for chromium  Not ready for trials of trickle TF yet per MDs. Small amounts of stool via rectal tube, NG in place, Cortrak removed  Current wt 76.2 kg; admit wt closure to 90 kg. Net negative 12-13 L fluid.   Labs: reviewed Meds: dulcolax suppository, ss novolog, levemir, solumedrol  Diet Order:   Diet Order             Diet NPO time specified  Diet effective now                   EDUCATION NEEDS:   No education needs have been identified at this time  Skin:  Skin Assessment: Skin Integrity Issues: Skin Integrity Issues:: Other (Comment) Incisions: closed incision left leg Other: chest closed, multiple skin tears  Last BM:  9/25 small  Height:   Ht Readings from Last 1 Encounters:  05/30/23 5\' 6"  (1.676 m)    Weight:   Wt Readings from Last 1 Encounters:  06/07/23 76.2 kg    Ideal Body Weight:  64.5 kg  BMI:  Body mass  index is 27.11 kg/m.  Estimated Nutritional Needs:   Kcal:  2200-2400  Protein:  130-150 grams  Fluid:  1L plus UOP   Romelle Starcher MS, RDN, LDN, CNSC Registered Dietitian 3 Clinical Nutrition RD Pager and On-Call Pager Number Located in [place]

## 2023-06-07 NOTE — Progress Notes (Signed)
ANTICOAGULATION CONSULT NOTE - Follow up Consult  Pharmacy Consult for bivalirudin Indication:  VV ECMO  Allergies  Allergen Reactions   Other Anaphylaxis    Mushrooms  Not listed on MAR    Plavix [Clopidogrel Bisulfate] Other (See Comments)    TTP Not listed on the [facility]   Fleet Enema [Enema] Other (See Comments)    Unknown reaction   Lovenox [Enoxaparin] Other (See Comments)    Unknown reaction   Morphine Other (See Comments)    Unknown reaction   Nsaids Other (See Comments)    Unknown reaction   Hydrocodone-Acetaminophen Itching    Patient Measurements: Height: 5\' 6"  (167.6 cm) Weight: 76.2 kg (167 lb 15.9 oz) IBW/kg (Calculated) : 63.8 Heparin Dosing Weight: 81.3 kg  Vital Signs: Temp: 98.2 F (36.8 C) (10/01 1225) Temp Source: Core (10/01 1225) Pulse Rate: 70 (10/01 1900)  Labs: Recent Labs    06/05/23 0346 06/05/23 0347 06/06/23 0415 06/06/23 0646 06/06/23 1617 06/06/23 1627 06/07/23 0354 06/07/23 0727 06/07/23 1009 06/07/23 1635 06/07/23 1817 06/07/23 1824  HGB 8.1*   < > 8.2*   < > 8.1*   < > 8.0*   < > 8.5* 7.9*  --  8.8*  HCT 25.0*   < > 25.2*   < > 24.2*   < > 23.9*   < > 25.0* 22.7*  --  26.0*  PLT 25*   < > 27*  --  29*  --  30*  --   --  86*  --   --   APTT 45*   < > 45*  --  43*  --  46*  --   --   --  50*  --   LABPROT 17.6*  --  17.7*  --   --   --  18.2*  --   --   --   --   --   INR 1.4*  --  1.4*  --   --   --  1.5*  --   --   --   --   --   CREATININE 1.13  1.11   < > 1.23  --  1.27*  --  1.11  --   --  1.18  --   --    < > = values in this interval not displayed.    Estimated Creatinine Clearance: 57.8 mL/min (by C-G formula based on SCr of 1.18 mg/dL).   Assessment: 62 yom underwent Bentall/AVR, TV repair, re-implantation of SVG-RCA complicated with vasoplegia and pulmonary edema requiring central VA ECMO. On 06/01/23 patient underwent transition VA to VV ECMO.   Hgb stable at 8, plt 30. Some fibrin in circuit. LDH is 908.  Fibrinogen trending down to 576. aPTT this morning is subtherapeutic at 46 sec on bivalirudin 0.01 mg/kg/hour. No s/sx of bleeding or infusion issues.   PM: aPTT 50 on bival 0.012 mg/kg/hr. No bleeding/stranding or infusion issues reported.   Goal of Therapy:  aPTT 50-70 seconds Monitor platelets by anticoagulation protocol: Yes   Plan:  Continue bivalirudin at 0.012 mg/kg/hr  Monitor q12 hr aPTT and CBC, LDH, fibrinogen and for s/sx of bleeding.  Thank you for allowing pharmacy to participate in this patient's care,  Loralee Pacas, PharmD, BCPS 06/07/2023 8:16 PM  Please check AMION for all [facility] Pharmacy phone numbers After 10:00 PM, call Main Pharmacy [phone]

## 2023-06-07 NOTE — Procedures (Signed)
Percutaneous Tracheostomy Procedure Note   Giovoni Bunch  [id number]  1961/06/12  Date:06/07/23  Time:11:38 AM   Provider Performing:Lilliam Chamblee C Katrinka Blazing  Procedure: Percutaneous Tracheostomy with Bronchoscopic Guidance (46962)  Indication(s) Persistent resp failure  Consent Risks of the procedure as well as the alternatives and risks of each were explained to the patient and/or caregiver.  Consent for the procedure was obtained.  Anesthesia Etomidate, Versed, Fentanyl, Vecuronium   Time Out Verified patient identification, verified procedure, site/side was marked, verified correct patient position, special equipment/implants available, medications/allergies/relevant history reviewed, required imaging and test results available.   Sterile Technique Maximal sterile technique including sterile barrier drape, hand hygiene, sterile gown, sterile gloves, mask, hair covering.    Procedure Description Appropriate anatomy identified by palpation.  Patient's neck prepped and draped in sterile fashion.  1% lidocaine with epinephrine was used to anesthetize skin overlying neck.  1.5cm incision made and blunt dissection performed until tracheal rings could be easily palpated.   Then a size 6-0 Shiley tracheostomy was placed under bronchoscopic visualization using usual Seldinger technique and serial dilation.   Bronchoscope confirmed placement above the carina.  Tracheostomy was sutured in place with adhesive pad to protect skin under pressure.    Patient connected to ventilator.   Complications/Tolerance None; patient tolerated the procedure well. Chest X-ray is ordered to confirm no post-procedural complication.   EBL Minimal   Specimen(s) None

## 2023-06-07 NOTE — Progress Notes (Signed)
EVENING ROUNDS NOTE :     [address]       [place] [id number]             [phone]                 7 Days Post-Op Procedure(s) (LRB): CONVERSION TO VV ECMO (EXTRACORPOREAL MEMBRANE OXYGENATION) (N/A) DECANNULATION FOR ECMO (N/A) TRANSESOPHAGEAL ECHOCARDIOGRAM (N/A)   Total Length of Stay:  LOS: 20 days  Events:   No events    BP 104/61   Pulse 70   Temp 98.2 F (36.8 C) (Core)   Resp (!) 30   Ht 5\' 6"  (1.676 m)   Wt 76.2 kg   SpO2 100%   BMI 27.11 kg/m   CVP:  [6 mmHg-61 mmHg] 9 mmHg  Vent Mode: PRVC FiO2 (%):  [40 %-100 %] 50 % Set Rate:  [22 bmp-26 bmp] 26 bmp Vt Set:  [250 mL] 250 mL PEEP:  [10 cmH20] 10 cmH20 Plateau Pressure:  [20 cmH20-30 cmH20] 30 cmH20    prismasol BGK 4/2.5 400 mL/hr at 06/07/23 1615    prismasol BGK 4/2.5 400 mL/hr at 06/07/23 0523   sodium chloride Stopped (05/24/23 0032)   sodium chloride Stopped (06/06/23 0237)   albumin human Stopped (05/30/23 0819)   anticoagulant sodium citrate     bivalirudin (ANGIOMAX) 250 mg in sodium chloride 0.9 % 500 mL (0.5 mg/mL) infusion 0.012 mg/kg/hr (06/07/23 2100)    ceFAZolin (ANCEF) IV 2 g (06/07/23 2112)   epinephrine 1 mcg/min (06/07/23 2100)   micafungin (MYCAMINE) 200 mg in sodium chloride 0.9 % 100 mL IVPB 110 mL/hr at 06/07/23 2100   norepinephrine (LEVOPHED) Adult infusion 7 mcg/min (06/07/23 1405)   prismasol BGK 4/2.5 1,500 mL/hr at 06/07/23 1944   TPN ADULT (ION) 75 mL/hr at 06/07/23 2100   vasopressin 0.02 Units/min (06/07/23 2100)    I/O last 3 completed shifts: In: 5642 [I.V.:3702.7; Blood:558; Other:30; IV Piggyback:1351.3] Out: 6689.4 [Other:150; Stool:96; Chest Tube:10]      Latest Ref Rng & Units 06/07/2023    6:24 PM 06/07/2023    4:35 PM 06/07/2023   10:09 AM  CBC  WBC 4.0 - 10.5 K/uL  50.0    Hemoglobin 13.0 - 17.0 g/dL 8.8  7.9  8.5   Hematocrit 39.0 - 52.0 % 26.0  22.7  25.0   Platelets 150 - 400 K/uL  86         Latest Ref Rng & Units  06/07/2023    6:24 PM 06/07/2023    4:35 PM 06/07/2023   10:09 AM  BMP  Glucose 70 - 99 mg/dL  914    BUN 8 - 23 mg/dL  40    Creatinine 7.82 - 1.24 mg/dL  9.56    Sodium 213 - 086 mmol/L 134  133  136   Potassium 3.5 - 5.1 mmol/L 4.6  4.6  3.9   Chloride 98 - 111 mmol/L  98    CO2 22 - 32 mmol/L  21    Calcium 8.9 - 10.3 mg/dL  8.3      ABG    Component Value Date/Time   PHART 7.377 06/07/2023 1824   PCO2ART 38.3 06/07/2023 1824   PO2ART 207 (H) 06/07/2023 1824   HCO3 22.7 06/07/2023 1824   TCO2 24 06/07/2023 1824   ACIDBASEDEF 2.0 06/07/2023 1824   O2SAT 100 06/07/2023 1824       Brynda Greathouse, MD 06/07/2023 9:37 PM

## 2023-06-07 NOTE — Progress Notes (Signed)
Patient ID: [name], male   DOB: 10-04-1960, 62 y.o.   MRN: [id number]   S:  Seen and examined on CRRT; procedure supervised.  He had 3.4 liters UF over 9/30 with CRRT.  Goal UF is net negative 100 ml/hr as tolerated however this has been difficult to achieve due to vagal events below.  No UOP is charted.  He continues on ECMO.  Per charting he has had multiple vagal events/coughing events overnight with sustained hypotension.  Vasopressin was added back on and epi was titrated to a peak of 11 mcg/min overnight.  Now epi at 9 mcg/min  He also had difficulty with turning.  CVP has been 10-13.  He was interactive with staff for the first time per RN report. No hemoptysis  Review of systems:  Unable to obtain secondary to intubated and obtunded     O:BP 104/61   Pulse 80   Temp 97.7 F (36.5 C)   Resp (!) 26   Ht 5\' 6"  (1.676 m)   Wt 76.2 kg   SpO2 100%   BMI 27.11 kg/m   Intake/Output Summary (Last 24 hours) at 06/07/2023 0615 Last data filed at 06/07/2023 0600 Gross per 24 hour  Intake 3625.66 ml  Output 4133.6 ml  Net -507.94 ml   Intake/Output: I/O last 3 completed shifts: In: 5434 [I.V.:3517.8; Other:60; NG/GT:60; IV Piggyback:1796.2] Out: 8251.4 [Emesis/NG output:120; Other:100; Stool:298; Chest Tube:20]  Intake/Output this shift:  Total I/O In: 1388.2 [I.V.:1258.2; Other:30; IV Piggyback:100.1] Out: 1879.2 [Other:150; Stool:31; Chest Tube:10] Weight change: -0.7 kg  General adult male in bed critically ill   HEENT normocephalic atraumatic  Neck supple trachea midline Lungs coarse mechanical breath sounds  Heart  On ECMO; on epi and vaso  Abdomen soft nontender with limitation of obtunded; nondistended Extremities no pitting edema  Neuro - no continuous sedation GU no foley  Access right subclavian dialysis catheter in place    Recent Labs  Lab 06/01/23 0424 06/01/23 0433 06/02/23 0403 06/02/23 0433 06/03/23 0405 06/03/23 0410 06/04/23 0413 06/04/23 6962  06/04/23 1604 06/04/23 1620 06/05/23 0346 06/05/23 0347 06/05/23 1621 06/05/23 1631 06/06/23 0415 06/06/23 0646 06/06/23 0903 06/06/23 1617 06/06/23 1627 06/06/23 2150 06/07/23 0351 06/07/23 0354  NA 137   < > 136  134*   < > 133*   < > 135  136   < > 135   < > 135  135   < > 133*   < > 134* 137 136 133* 138 135 135 134*  K 4.2   < > 4.1  4.1   < > 4.6   < > 4.4  4.3   < > 4.7   < > 4.2  4.2   < > 4.1   < > 4.1 4.1 3.8 4.1 3.9 3.9 4.1 4.1  CL 102   < > 96*  96*   < > 97*   < > 101  --  99  --  99  98  --  98  --  99  --   --  101  --   --   --  98  CO2 19*   < > 27  32   < > 27   < > 23  --  23  --  22  23  --  22  --  22  --   --  20*  --   --   --  21*  GLUCOSE 204*   < > 202*  202*   < > 230*   < > 196*  --  228*  --  173*  171*  --  206*  --  200*  --   --  191*  --   --   --  176*  BUN 18   < > 23  23   < > 24*   < > 29*  --  31*  --  34*  34*  --  36*  --  37*  --   --  42*  --   --   --  37*  CREATININE 1.19   < > 0.98  1.03   < > 0.93   < > 1.06  --  1.06  --  1.13  1.11  --  1.11  --  1.23  --   --  1.27*  --   --   --  1.11  ALBUMIN 2.0*  2.0*   < > 1.8*  1.8*   < > 1.9*   < > 2.0*  2.0*  --  2.0*  --  2.0*  2.0*  --  2.0*  --  2.0*  --   --  2.0*  --   --   --  2.0*  2.0*  CALCIUM 8.1*   < > 8.0*  7.8*   < > 8.1*   < > 8.2*  --  8.3*  --  8.6*  8.6*  --  8.5*  --  8.7*  --   --  8.2*  --   --   --  8.5*  PHOS 3.9   < > 2.2*  2.2*   < > 3.2   < > 3.4  --  3.9  --  3.1  --  3.5  --  3.3  --   --  3.4  --   --   --  3.5  AST 88*  --  112*  --  111*  --  108*  --   --   --  116*  --   --   --  126*  --   --   --   --   --   --  109*  ALT 12  --  10  --  13  --  16  --   --   --  17  --   --   --  17  --   --   --   --   --   --  14   < > = values in this interval not displayed.   Liver Function Tests: Recent Labs  Lab 06/05/23 0346 06/05/23 1621 06/06/23 0415 06/06/23 1617 06/07/23 0354  AST 116*  --  126*  --  109*  ALT 17  --  17  --  14   ALKPHOS 92  --  122  --  131*  BILITOT 5.2*  --  4.3*  --  4.0*  PROT 7.7  --  8.4*  --  8.0  ALBUMIN 2.0*  2.0*   < > 2.0* 2.0* 2.0*  2.0*   < > = values in this interval not displayed.   No results for input(s): "LIPASE", "AMYLASE" in the last 168 hours. Recent Labs  Lab 06/03/23 0742 06/04/23 1229  AMMONIA 26 40*   CBC: Recent Labs  Lab 06/05/23 0346 06/05/23 0347 06/05/23 1621 06/05/23 1631 06/06/23 0415 06/06/23 0646 06/06/23 1617 06/06/23 1627 06/06/23 2150 06/07/23 0351 06/07/23 0354  WBC  44.5*  --  42.7*  --  46.5*  --  45.4*  --   --   --  52.4*  HGB 8.1*   < > 8.3*   < > 8.2*   < > 8.1*   < > 8.8* 8.8* 8.0*  HCT 25.0*   < > 25.2*   < > 25.2*   < > 24.2*   < > 26.0* 26.0* 23.9*  MCV 90.6  --  92.0  --  92.0  --  89.0  --   --   --  91.9  PLT 25*  --  28*  --  27*  --  29*  --   --   --  30*   < > = values in this interval not displayed.   Cardiac Enzymes: No results for input(s): "CKTOTAL", "CKMB", "CKMBINDEX", "TROPONINI" in the last 168 hours. CBG: Recent Labs  Lab 06/06/23 1130 06/06/23 1623 06/06/23 1935 06/06/23 2312 06/07/23 0348  GLUCAP 175* 162* 224* 186* 169*    Iron Studies: No results for input(s): "IRON", "TIBC", "TRANSFERRIN", "FERRITIN" in the last 72 hours. Studies/Results: DG CHEST PORT 1 VIEW  Result Date: 06/06/2023 CLINICAL DATA:  ECMO. EXAM: PORTABLE CHEST 1 VIEW COMPARISON:  Radiograph yesterday FINDINGS: Stable support apparatus including endotracheal tube, enteric tube, left subclavian central line, right internal jugular dialysis catheter and large bore ECMO catheter, bilateral chest tubes and mediastinal drain. Stable lung volumes. Heterogeneous bilateral lung opacities without significant interval change, worse on the right. Small bilateral pleural effusions. No visible pneumothorax. IMPRESSION: 1. Stable support apparatus. 2. Heterogeneous bilateral lung opacities without significant interval change, worse on the right. 3.  Small bilateral pleural effusions. Electronically Signed   By: Narda Rutherford M.D.   On: 06/06/2023 10:39    artificial tears   Both Eyes Q8H   bisacodyl  10 mg Rectal Daily   Chlorhexidine Gluconate Cloth  6 each Topical Daily   dextrose  10 mL Intravenous Q24H   dextrose  10 mL Intravenous Q24H   insulin aspart  0-20 Units Subcutaneous Q4H   insulin aspart  5 Units Subcutaneous Q4H   insulin detemir  20 Units Subcutaneous BID   mouth rinse  15 mL Mouth Rinse Q2H   pantoprazole (PROTONIX) IV  40 mg Intravenous QHS   sodium chloride  250 mL Intravenous Q24H   sodium chloride  250 mL Intravenous Q24H   sodium chloride flush  3 mL Intravenous Q12H    BMET    Component Value Date/Time   NA 134 (L) 06/07/2023 0354   NA 143 11/02/2022 1454   K 4.1 06/07/2023 0354   CL 98 06/07/2023 0354   CO2 21 (L) 06/07/2023 0354   GLUCOSE 176 (H) 06/07/2023 0354   BUN 37 (H) 06/07/2023 0354   BUN 31 (H) 11/02/2022 1454   CREATININE 1.11 06/07/2023 0354   CREATININE 2.71 (H) 06/03/2023 0950   CREATININE 1.05 02/24/2015 1601   CALCIUM 8.5 (L) 06/07/2023 0354   GFRNONAA >60 06/07/2023 0354   GFRNONAA 26 (L) 05/08/2023 0950   GFRNONAA 80 02/24/2015 1601   GFRAA 62 10/09/2020 1520   GFRAA >89 02/24/2015 1601   CBC    Component Value Date/Time   WBC 52.4 (HH) 06/07/2023 0354   RBC 2.60 (L) 06/07/2023 0354   HGB 8.0 (L) 06/07/2023 0354   HGB 6.4 (LL) 06/03/2023 0852   HGB 10.2 (L) 08/12/2022 0934   HCT 23.9 (L) 06/07/2023 0354   HCT 29.2 (L) 08/12/2022 9147  PLT 30 (L) 06/07/2023 0354   PLT 109 (L) 05/24/2023 0852   PLT 252 08/12/2022 0934   MCV 91.9 06/07/2023 0354   MCV 94 08/12/2022 0934   MCH 30.8 06/07/2023 0354   MCHC 33.5 06/07/2023 0354   RDW 17.4 (H) 06/07/2023 0354   RDW 15.5 (H) 08/12/2022 0934   LYMPHSABS 5.8 (H) 05/29/2023 1722   LYMPHSABS 1.9 11/04/2021 1028   MONOABS 6.3 (H) 05/29/2023 1722   EOSABS 0.0 05/29/2023 1722   EOSABS 0.2 11/04/2021 1028   BASOSABS 0.0  05/29/2023 1722   BASOSABS 0.1 11/04/2021 1028    Assessment/Plan:   Anuric AKI/dialysis dependent AKI with fluid overload: Started on RRT during previous admission in May 2024 until 05/10/23 (recovered). Patient is status post cardiac surgery on 9/13 and currently on ECMO. CRRT start 9/11. No signs of renal recovery.   UF as tolerated by BP and ECMO.  Goal at present is net negative 50 to 100 ml/hr as  tolerated; please page nephrology for UF goal changes  All 4K fluids  On angiomax per team for anticoagulation.  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  Per CTS Shock: multifactorial including cardiogenic and septic, on pressors and ECMO per primary team. Right lung consolidation. Profound leukocytosis, followed by ID, heart failure team   Fungal infection - Concern for invasive mold such as aspergillis infection - antifungals per ID  Acute hypoxic respiratory failure: on vent; per primary service. Abx/antifungal per primary service and ID   Anemia, thrombocytopenia: Transfuse as needed per primary team. Hypophosphatemia - repletion prn  Disposition - in ICU on CRRT, ECMO   Estanislado Emms, MD 6:38 AM 06/07/2023

## 2023-06-07 NOTE — Progress Notes (Addendum)
ANTICOAGULATION CONSULT NOTE - Follow up Consult  Pharmacy Consult for bivalirudin Indication:  VV ECMO  Allergies  Allergen Reactions   Other Anaphylaxis    Mushrooms  Not listed on MAR    Plavix [Clopidogrel Bisulfate] Other (See Comments)    TTP Not listed on the [facility]   Fleet Enema [Enema] Other (See Comments)    Unknown reaction   Lovenox [Enoxaparin] Other (See Comments)    Unknown reaction   Morphine Other (See Comments)    Unknown reaction   Nsaids Other (See Comments)    Unknown reaction   Hydrocodone-Acetaminophen Itching    Patient Measurements: Height: 5\' 6"  (167.6 cm) Weight: 76.2 kg (167 lb 15.9 oz) IBW/kg (Calculated) : 63.8 Heparin Dosing Weight: 81.3 kg  Vital Signs: Temp: 97.5 F (36.4 C) (10/01 0700) Temp Source: Esophageal (10/01 0400) BP: 104/61 (10/01 0301) Pulse Rate: 84 (10/01 0700)  Labs: Recent Labs    06/05/23 0346 06/05/23 0347 06/06/23 0415 06/06/23 0646 06/06/23 1617 06/06/23 1627 06/06/23 2150 06/07/23 0351 06/07/23 0354  HGB 8.1*   < > 8.2*   < > 8.1*   < > 8.8* 8.8* 8.0*  HCT 25.0*   < > 25.2*   < > 24.2*   < > 26.0* 26.0* 23.9*  PLT 25*   < > 27*  --  29*  --   --   --  30*  APTT 45*   < > 45*  --  43*  --   --   --  46*  LABPROT 17.6*  --  17.7*  --   --   --   --   --  18.2*  INR 1.4*  --  1.4*  --   --   --   --   --  1.5*  CREATININE 1.13  1.11   < > 1.23  --  1.27*  --   --   --  1.11   < > = values in this interval not displayed.    Estimated Creatinine Clearance: 61.5 mL/min (by C-G formula based on SCr of 1.11 mg/dL).   Assessment: 62 yom underwent Bent[place]/AVR, TV repair, re-implantation of SVG-RCA complicated with vasoplegia and pulmonary edema requiring central VA ECMO. On 9/24 patient underwent transition VA to VV ECMO.   Hgb stable at 8, plt 30. Some fibrin in circuit. LDH is 908. Fibrinogen trending down to 576. aPTT this morning is subtherapeutic at 46 sec on bivalirudin 0.01 mg/kg/hour. No s/sx of  bleeding or infusion issues.    Goal of Therapy:  aPTT 50-70 seconds Monitor platelets by anticoagulation protocol: Yes   Plan:  Increase bivalirudin at 0.012 mg/kg/hr - currently off for trach at 1100 so will increase when resume after  Monitor q12 hr aPTT and CBC, LDH, fibrinogen and for s/sx of bleeding.  Thank you for allowing pharmacy to participate in this patient's care,  Sherron Monday, PharmD, BCCCP Clinical Pharmacist  Phone: [phone] 06/07/2023 7:56 AM  Please check AMION for all [facility] Pharmacy phone numbers After 10:00 PM, call Main Pharmacy [phone]

## 2023-06-07 NOTE — Progress Notes (Signed)
Patient has had multiple vagal events overnight resulting in sustained hypotension. Each time, the patient has gone into a coughing fit and dropped his systolic pressure into the high 70s/low 80s. It takes about 20 minutes for the patient to recover his pressure after each vagal event. Vasopressin had to be added back on and epinephrine has been titrated from 6 at the start of my shift to 11 at the highest.  Patient also has not been tolerating turns tonight. Turns have exacerbated coughing fits, and at one point this caused the CRRT to alarm for extreme access pressures. The access pressure alarm did not stop until the patient was put back onto his back.  Turns have been deferred for most of this RN's shift

## 2023-06-07 NOTE — Procedures (Signed)
Bronchoscopy Procedure Note  [name]  [id number]  March 14, 1961  Date:06/07/23  Time:1:04 PM   Provider Performing:Lawayne Hartig   Procedure(s):  Flexible bronchoscopy with bronchial alveolar lavage (78295) and Subsequent Therapeutic Aspiration of Tracheobronchial Tree (62130)  Indication(s) Acute respiratory failure  Consent Risks of the procedure as well as the alternatives and risks of each were explained to the patient and/or caregiver.  Consent for the procedure was obtained and is signed in the bedside chart  Anesthesia Etomidate and Rocuronium   Time Out Verified patient identification, verified procedure, site/side was marked, verified correct patient position, special equipment/implants available, medications/allergies/relevant history reviewed, required imaging and test results available.   Sterile Technique Usual hand hygiene, masks, gowns, and gloves were used   Procedure Description Bronchoscope advanced through endotracheal tube and into airway.  Airways were examined down to subsegmental level with findings noted below.   Following diagnostic evaluation, BAL(s) performed in RLL with normal saline and return of Greenish fluid and Therapeutic aspiration performed in Throughout the airway tree  Findings: Copious amounts of tenacious secretions noted throughout the airway, suctioned and BAL performed    Complications/Tolerance None; patient tolerated the procedure well. Chest X-ray is needed post procedure.   EBL Minimal   Specimen(s) BAL

## 2023-06-07 NOTE — Progress Notes (Signed)
Patient ID: [name], male   DOB: 04/21/1961, 62 y.o.   MRN: [id number]   Extracorporeal support note   ECLS support day: 7 (V-V) (Day 19 of ECLS support) Indication: Acute hypoxic respiratory failure  Configuration: Crescent VV ECMO  30FR Dual-lumen Crescent cannula in RIJ   Pump speed: 3400rpm  Pump flow: 3.5 L/min Pump used: Cardiohelp  Sweep gas: 6.5  ABG: 7.4/39/170 LDH: 908  Circuit check: Small clot at 12p Anticoagulant: On bival Anticoagulation targets:  PTT 50-60  Changes in support: none  Anticipated goals/duration of support: Wean VV ECMO as able; starting steroids tomorrow.  Eliezer Lofts Nile Prisk DO  06/07/2023, 7:51 AM

## 2023-06-07 NOTE — Progress Notes (Signed)
Pharmacy Antibiotic Note  [name] is a 62 y.o. male admitted on 05/15/2023 with  escalation in fungal CNS/PNA coverage .Pharmacy has been consulted for amphotericin B dosing.  BAL cx continue to grow few fungus (mold) - BAL cx from 9/20 growing candida albicans. Was started on micafungin on 9/20. Discussion with HF/CCM/CTVS on rounds - would like escalation in anti-fungal coverage. Voriconazole avoid given ECMO circuit given variable PK effects in studies. Isavuconazole is currently unavailable/not stocked. Posaconazole avoided given baseline prolonged Qtc, variable CNS penetration, and slightly elevated LFTs. CT head with no acute finding although completed without contrast. CT chest showing extensive bilateral pulmonary opacities consistent with PNA.   WBC 52.4, low temps on CRRT and TPN. On 250 mL fluids running before and after AmphoB.  Watching K/Mg closely with amphoB however CRRT/TPN will aid to provide balance. K 4.1, Mg 2.4 from AM labs  Plan: Continue Amphotericin B liposome 240 mg (3 mg/kg) every 24 hours --F/U Aspergillus antibody galactomannan, blastomyces antigen Monitor electrolytes (K>4, Mag>2), signs of infusion reaction, cx results, clinical pic  Height: 5\' 6"  (167.6 cm) Weight: 76.2 kg (167 lb 15.9 oz) IBW/kg (Calculated) : 63.8  Temp (24hrs), Avg:97.6 F (36.4 C), Min:96.6 F (35.9 C), Max:98.2 F (36.8 C)  Recent Labs  Lab 06/03/23 0606 06/03/23 1606 06/04/23 0412 06/04/23 0413 06/05/23 0346 06/05/23 0759 06/05/23 1621 06/06/23 0415 06/06/23 0419 06/06/23 1617 06/07/23 0354 06/07/23 0411  WBC  --    < >  --    < > 44.5*  --  42.7* 46.5*  --  45.4* 52.4*  --   CREATININE  --    < >  --    < > 1.13  1.11  --  1.11 1.23  --  1.27* 1.11  --   LATICACIDVEN 1.7  --  1.8  --   --  1.9  --   --  1.5  --   --  1.7   < > = values in this interval not displayed.    Estimated Creatinine Clearance: 61.5 mL/min (by C-G formula based on SCr of 1.11 mg/dL).     Allergies  Allergen Reactions   Other Anaphylaxis    Mushrooms  Not listed on MAR    Plavix [Clopidogrel Bisulfate] Other (See Comments)    TTP Not listed on the [facility]   Fleet Enema [Enema] Other (See Comments)    Unknown reaction   Lovenox [Enoxaparin] Other (See Comments)    Unknown reaction   Morphine Other (See Comments)    Unknown reaction   Nsaids Other (See Comments)    Unknown reaction   Hydrocodone-Acetaminophen Itching    Antimicrobials this admission: Ceftriaxone 9/11>> 9/15 Meropenem 9/15 >>9/28 Vanc 9/11>>9/27 Micafungin 9/20 >> 9/30 AmphoB 9/28>>  Dose adjustments this admission: N/A  Microbiology results: Blastomyces antigen 9/27: sent  Aspergillus antigen 9/27: sent  BAL 9/28: no orgs seen to date, few fungi (probable contaminant/colonizer - micro to identify) BAL 9/20: candida albicans, fungus/mold isolated (probable contaminant/colonizer - micro to identify) Aortic valve 9/13: ngtd Bcx 9/11: ngtd  Thank you for allowing pharmacy to be a part of this patient's care.  Georgina Pillion, PharmD, BCPS, BCIDP Infectious Diseases Clinical Pharmacist 06/07/2023 1:26 PM   **Pharmacist phone directory can now be found on [email or web address] ([email or web address]).  Listed under [facility].

## 2023-06-07 DEATH — deceased

## 2023-06-08 ENCOUNTER — Inpatient Hospital Stay (HOSPITAL_COMMUNITY): Payer: PPO

## 2023-06-08 DIAGNOSIS — R570 Cardiogenic shock: Secondary | ICD-10-CM | POA: Diagnosis not present

## 2023-06-08 DIAGNOSIS — J189 Pneumonia, unspecified organism: Secondary | ICD-10-CM | POA: Diagnosis not present

## 2023-06-08 DIAGNOSIS — B371 Pulmonary candidiasis: Secondary | ICD-10-CM | POA: Diagnosis not present

## 2023-06-08 LAB — PREPARE PLATELET PHERESIS
Unit division: 0
Unit division: 0

## 2023-06-08 LAB — POCT I-STAT 7, (LYTES, BLD GAS, ICA,H+H)
Acid-base deficit: 1 mmol/L (ref 0.0–2.0)
Acid-base deficit: 1 mmol/L (ref 0.0–2.0)
Acid-base deficit: 2 mmol/L (ref 0.0–2.0)
Acid-base deficit: 2 mmol/L (ref 0.0–2.0)
Acid-base deficit: 2 mmol/L (ref 0.0–2.0)
Acid-base deficit: 4 mmol/L — ABNORMAL HIGH (ref 0.0–2.0)
Bicarbonate: 21.3 mmol/L (ref 20.0–28.0)
Bicarbonate: 22.7 mmol/L (ref 20.0–28.0)
Bicarbonate: 23.4 mmol/L (ref 20.0–28.0)
Bicarbonate: 23.5 mmol/L (ref 20.0–28.0)
Bicarbonate: 23.6 mmol/L (ref 20.0–28.0)
Bicarbonate: 24.6 mmol/L (ref 20.0–28.0)
Calcium, Ion: 1.11 mmol/L — ABNORMAL LOW (ref 1.15–1.40)
Calcium, Ion: 1.15 mmol/L (ref 1.15–1.40)
Calcium, Ion: 1.17 mmol/L (ref 1.15–1.40)
Calcium, Ion: 1.17 mmol/L (ref 1.15–1.40)
Calcium, Ion: 1.18 mmol/L (ref 1.15–1.40)
Calcium, Ion: 1.19 mmol/L (ref 1.15–1.40)
HCT: 26 % — ABNORMAL LOW (ref 39.0–52.0)
HCT: 26 % — ABNORMAL LOW (ref 39.0–52.0)
HCT: 27 % — ABNORMAL LOW (ref 39.0–52.0)
HCT: 27 % — ABNORMAL LOW (ref 39.0–52.0)
HCT: 27 % — ABNORMAL LOW (ref 39.0–52.0)
HCT: 31 % — ABNORMAL LOW (ref 39.0–52.0)
Hemoglobin: 10.5 g/dL — ABNORMAL LOW (ref 13.0–17.0)
Hemoglobin: 8.8 g/dL — ABNORMAL LOW (ref 13.0–17.0)
Hemoglobin: 8.8 g/dL — ABNORMAL LOW (ref 13.0–17.0)
Hemoglobin: 9.2 g/dL — ABNORMAL LOW (ref 13.0–17.0)
Hemoglobin: 9.2 g/dL — ABNORMAL LOW (ref 13.0–17.0)
Hemoglobin: 9.2 g/dL — ABNORMAL LOW (ref 13.0–17.0)
O2 Saturation: 100 %
O2 Saturation: 100 %
O2 Saturation: 100 %
O2 Saturation: 100 %
O2 Saturation: 100 %
O2 Saturation: 100 %
Patient temperature: 36.3
Patient temperature: 36.8
Patient temperature: 36.8
Patient temperature: 36.9
Patient temperature: 36.9
Patient temperature: 37
Potassium: 3.9 mmol/L (ref 3.5–5.1)
Potassium: 4.1 mmol/L (ref 3.5–5.1)
Potassium: 4.1 mmol/L (ref 3.5–5.1)
Potassium: 4.2 mmol/L (ref 3.5–5.1)
Potassium: 4.3 mmol/L (ref 3.5–5.1)
Potassium: 4.6 mmol/L (ref 3.5–5.1)
Sodium: 135 mmol/L (ref 135–145)
Sodium: 136 mmol/L (ref 135–145)
Sodium: 136 mmol/L (ref 135–145)
Sodium: 136 mmol/L (ref 135–145)
Sodium: 136 mmol/L (ref 135–145)
Sodium: 136 mmol/L (ref 135–145)
TCO2: 22 mmol/L (ref 22–32)
TCO2: 24 mmol/L (ref 22–32)
TCO2: 25 mmol/L (ref 22–32)
TCO2: 25 mmol/L (ref 22–32)
TCO2: 25 mmol/L (ref 22–32)
TCO2: 26 mmol/L (ref 22–32)
pCO2 arterial: 36.8 mm[Hg] (ref 32–48)
pCO2 arterial: 37.9 mm[Hg] (ref 32–48)
pCO2 arterial: 38.8 mm[Hg] (ref 32–48)
pCO2 arterial: 39.5 mm[Hg] (ref 32–48)
pCO2 arterial: 41 mm[Hg] (ref 32–48)
pCO2 arterial: 43.1 mm[Hg] (ref 32–48)
pH, Arterial: 7.346 — ABNORMAL LOW (ref 7.35–7.45)
pH, Arterial: 7.364 (ref 7.35–7.45)
pH, Arterial: 7.365 (ref 7.35–7.45)
pH, Arterial: 7.377 (ref 7.35–7.45)
pH, Arterial: 7.398 (ref 7.35–7.45)
pH, Arterial: 7.401 (ref 7.35–7.45)
pO2, Arterial: 213 mm[Hg] — ABNORMAL HIGH (ref 83–108)
pO2, Arterial: 216 mm[Hg] — ABNORMAL HIGH (ref 83–108)
pO2, Arterial: 224 mm[Hg] — ABNORMAL HIGH (ref 83–108)
pO2, Arterial: 227 mm[Hg] — ABNORMAL HIGH (ref 83–108)
pO2, Arterial: 229 mm[Hg] — ABNORMAL HIGH (ref 83–108)
pO2, Arterial: 231 mm[Hg] — ABNORMAL HIGH (ref 83–108)

## 2023-06-08 LAB — BPAM PLATELET PHERESIS
Blood Product Expiration Date: 202410032359
Blood Product Expiration Date: 202410032359
ISSUE DATE / TIME: 202410010840
ISSUE DATE / TIME: 202410011057
Unit Type and Rh: 5100
Unit Type and Rh: 5100

## 2023-06-08 LAB — TYPE AND SCREEN
ABO/RH(D): B POS
Antibody Screen: NEGATIVE
Unit division: 0
Unit division: 0
Unit division: 0
Unit division: 0

## 2023-06-08 LAB — CBC
HCT: 23.3 % — ABNORMAL LOW (ref 39.0–52.0)
HCT: 23.9 % — ABNORMAL LOW (ref 39.0–52.0)
HCT: 23.1 % — ABNORMAL LOW (ref 39.0–52.0)
Hemoglobin: 7.7 g/dL — ABNORMAL LOW (ref 13.0–17.0)
Hemoglobin: 8 g/dL — ABNORMAL LOW (ref 13.0–17.0)
Hemoglobin: 7.9 g/dL — ABNORMAL LOW (ref 13.0–17.0)
MCH: 29.7 pg (ref 26.0–34.0)
MCH: 30.9 pg (ref 26.0–34.0)
MCH: 31 pg (ref 26.0–34.0)
MCHC: 33 g/dL (ref 30.0–36.0)
MCHC: 33.5 g/dL (ref 30.0–36.0)
MCHC: 34.2 g/dL (ref 30.0–36.0)
MCV: 90 fL (ref 80.0–100.0)
MCV: 92.3 fL (ref 80.0–100.0)
MCV: 90.6 fL (ref 80.0–100.0)
Platelets: 66 10*3/uL — ABNORMAL LOW (ref 150–400)
Platelets: 56 10*3/uL — ABNORMAL LOW (ref 150–400)
Platelets: 47 K/uL — ABNORMAL LOW (ref 150–400)
RBC: 2.59 MIL/uL — ABNORMAL LOW (ref 4.22–5.81)
RBC: 2.59 MIL/uL — ABNORMAL LOW (ref 4.22–5.81)
RBC: 2.55 MIL/uL — ABNORMAL LOW (ref 4.22–5.81)
RDW: 17.5 % — ABNORMAL HIGH (ref 11.5–15.5)
RDW: 17.6 % — ABNORMAL HIGH (ref 11.5–15.5)
RDW: 17.4 % — ABNORMAL HIGH (ref 11.5–15.5)
WBC: 53 10*3/uL (ref 4.0–10.5)
WBC: 51.1 10*3/uL (ref 4.0–10.5)
WBC: 54.4 K/uL (ref 4.0–10.5)
nRBC: 0.8 % — ABNORMAL HIGH (ref 0.0–0.2)
nRBC: 1.1 % — ABNORMAL HIGH (ref 0.0–0.2)
nRBC: 1.2 % — ABNORMAL HIGH (ref 0.0–0.2)

## 2023-06-08 LAB — RENAL FUNCTION PANEL
Albumin: 2.2 g/dL — ABNORMAL LOW (ref 3.5–5.0)
Albumin: 2.2 g/dL — ABNORMAL LOW (ref 3.5–5.0)
Anion gap: 11 (ref 5–15)
Anion gap: 13 (ref 5–15)
BUN: 45 mg/dL — ABNORMAL HIGH (ref 8–23)
BUN: 48 mg/dL — ABNORMAL HIGH (ref 8–23)
CO2: 22 mmol/L (ref 22–32)
CO2: 21 mmol/L — ABNORMAL LOW (ref 22–32)
Calcium: 8.6 mg/dL — ABNORMAL LOW (ref 8.9–10.3)
Calcium: 8.6 mg/dL — ABNORMAL LOW (ref 8.9–10.3)
Chloride: 100 mmol/L (ref 98–111)
Chloride: 100 mmol/L (ref 98–111)
Creatinine, Ser: 1.16 mg/dL (ref 0.61–1.24)
Creatinine, Ser: 1.18 mg/dL (ref 0.61–1.24)
GFR, Estimated: >60 mL/min
GFR, Estimated: >60 mL/min
Glucose, Bld: 231 mg/dL — ABNORMAL HIGH (ref 70–99)
Glucose, Bld: 259 mg/dL — ABNORMAL HIGH (ref 70–99)
Phosphorus: 3.9 mg/dL (ref 2.5–4.6)
Phosphorus: 4 mg/dL (ref 2.5–4.6)
Potassium: 4.6 mmol/L (ref 3.5–5.1)
Potassium: 4 mmol/L (ref 3.5–5.1)
Sodium: 133 mmol/L — ABNORMAL LOW (ref 135–145)
Sodium: 134 mmol/L — ABNORMAL LOW (ref 135–145)

## 2023-06-08 LAB — HEPATIC FUNCTION PANEL
ALT: 10 U/L (ref 0–44)
AST: 98 U/L — ABNORMAL HIGH (ref 15–41)
Albumin: 2.3 g/dL — ABNORMAL LOW (ref 3.5–5.0)
Alkaline Phosphatase: 135 U/L — ABNORMAL HIGH (ref 38–126)
Bilirubin, Direct: 2.2 mg/dL — ABNORMAL HIGH (ref 0.0–0.2)
Indirect Bilirubin: 1.9 mg/dL — ABNORMAL HIGH (ref 0.3–0.9)
Total Bilirubin: 4.1 mg/dL — ABNORMAL HIGH (ref 0.3–1.2)
Total Protein: 8.5 g/dL — ABNORMAL HIGH (ref 6.5–8.1)

## 2023-06-08 LAB — GLUCOSE, CAPILLARY
Glucose-Capillary: 231 mg/dL — ABNORMAL HIGH (ref 70–99)
Glucose-Capillary: 242 mg/dL — ABNORMAL HIGH (ref 70–99)
Glucose-Capillary: 247 mg/dL — ABNORMAL HIGH (ref 70–99)
Glucose-Capillary: 249 mg/dL — ABNORMAL HIGH (ref 70–99)
Glucose-Capillary: 276 mg/dL — ABNORMAL HIGH (ref 70–99)
Glucose-Capillary: 288 mg/dL — ABNORMAL HIGH (ref 70–99)
Glucose-Capillary: 302 mg/dL — ABNORMAL HIGH (ref 70–99)

## 2023-06-08 LAB — MISC LABCORP TEST (SEND OUT)
LabCorp test name: 8482
Labcorp test code: 8474

## 2023-06-08 LAB — BPAM RBC
Blood Product Expiration Date: 202410272359
Blood Product Expiration Date: 202410292359
Blood Product Expiration Date: 202410292359
Blood Product Expiration Date: 202410292359
ISSUE DATE / TIME: 202409270842
ISSUE DATE / TIME: 202409270842
ISSUE DATE / TIME: 202409270842
ISSUE DATE / TIME: 202409270842
Unit Type and Rh: 7300
Unit Type and Rh: 7300
Unit Type and Rh: 7300
Unit Type and Rh: 7300

## 2023-06-08 LAB — ASPERGILLUS ANTIBODY BY IMMUNODIFF
Aspergillus flavus: NEGATIVE
Aspergillus fumigatus, IgG: NEGATIVE
Aspergillus niger: NEGATIVE

## 2023-06-08 LAB — CG4 I-STAT (LACTIC ACID)
Lactic Acid, Venous: 2.2 mmol/L (ref 0.5–1.9)
Lactic Acid, Venous: 4.5 mmol/L (ref 0.5–1.9)
Lactic Acid, Venous: 4.2 mmol/L (ref 0.5–1.9)
Lactic Acid, Venous: 5.9 mmol/L (ref 0.5–1.9)
Lactic Acid, Venous: 3.3 mmol/L (ref 0.5–1.9)

## 2023-06-08 LAB — APTT
aPTT: 47 s — ABNORMAL HIGH (ref 24–36)
aPTT: 50 s — ABNORMAL HIGH (ref 24–36)

## 2023-06-08 LAB — PROTIME-INR
INR: 1.5 — ABNORMAL HIGH (ref 0.8–1.2)
Prothrombin Time: 18.5 s — ABNORMAL HIGH (ref 11.4–15.2)

## 2023-06-08 LAB — LACTATE DEHYDROGENASE: LDH: 1012 U/L — ABNORMAL HIGH (ref 98–192)

## 2023-06-08 LAB — BLASTOMYCES ANTIGEN: Blastomyces Antigen: NOT DETECTED ng/mL

## 2023-06-08 LAB — FIBRINOGEN: Fibrinogen: 611 mg/dL — ABNORMAL HIGH (ref 210–475)

## 2023-06-08 LAB — PREPARE RBC (CROSSMATCH)

## 2023-06-08 MED ORDER — INSULIN DETEMIR 100 UNIT/ML ~~LOC~~ SOLN
10.0000 [IU] | Freq: Once | SUBCUTANEOUS | Status: AC
  Administered 2023-06-08: 10 [IU] via SUBCUTANEOUS
  Filled 2023-06-08: qty 0.1

## 2023-06-08 MED ORDER — TRACE MINERALS CU-MN-SE-ZN 300-55-60-3000 MCG/ML IV SOLN
INTRAVENOUS | Status: AC
  Filled 2023-06-08: qty 936

## 2023-06-08 MED ORDER — SODIUM CHLORIDE 0.9% IV SOLUTION: Freq: Once | INTRAVENOUS | Status: DC

## 2023-06-08 MED ORDER — DARBEPOETIN ALFA 40 MCG/0.4ML IJ SOSY
40.0000 ug | PREFILLED_SYRINGE | INTRAMUSCULAR | Status: DC
  Administered 2023-06-08: 40 ug via SUBCUTANEOUS
  Filled 2023-06-08: qty 0.4

## 2023-06-08 MED ORDER — INSULIN DETEMIR 100 UNIT/ML ~~LOC~~ SOLN
25.0000 [IU] | Freq: Every day | SUBCUTANEOUS | Status: AC
  Administered 2023-06-08: 25 [IU] via SUBCUTANEOUS

## 2023-06-08 MED ORDER — INSULIN DETEMIR 100 UNIT/ML ~~LOC~~ SOLN
25.0000 [IU] | Freq: Two times a day (BID) | SUBCUTANEOUS | Status: DC
  Administered 2023-06-08 – 2023-06-09 (×2): 25 [IU] via SUBCUTANEOUS
  Filled 2023-06-08 (×3): qty 0.25

## 2023-06-08 NOTE — Progress Notes (Signed)
ANTICOAGULATION CONSULT NOTE - Follow up Consult  Pharmacy Consult for bivalirudin Indication:  VV ECMO  Allergies  Allergen Reactions   Other Anaphylaxis    Mushrooms  Not listed on MAR    Plavix [Clopidogrel Bisulfate] Other (See Comments)    TTP Not listed on the [facility]   Fleet Enema [Enema] Other (See Comments)    Unknown reaction   Lovenox [Enoxaparin] Other (See Comments)    Unknown reaction   Morphine Other (See Comments)    Unknown reaction   Nsaids Other (See Comments)    Unknown reaction   Hydrocodone-Acetaminophen Itching    Patient Measurements: Height: 5\' 6"  (167.6 cm) Weight: 76.1 kg (167 lb 12.3 oz) IBW/kg (Calculated) : 63.8 Heparin Dosing Weight: 81.3 kg  Vital Signs: Temp: 98.2 F (36.8 C) (10/02 1655) Temp Source: Core (10/02 1655) BP: 123/66 (10/02 1608) Pulse Rate: 67 (10/02 1700)  Labs: Recent Labs    06/06/23 0415 06/06/23 0646 06/07/23 0354 06/07/23 0727 06/07/23 1635 06/07/23 1817 06/07/23 1824 06/08/23 0424 06/08/23 0432 06/08/23 0953 06/08/23 1137 06/08/23 1638  HGB 8.2*   < > 8.0*   < > 7.9*  --    < > 7.7*   < > 9.2* 8.0* 7.9*  9.2*  HCT 25.2*   < > 23.9*   < > 22.7*  --    < > 23.3*   < > 27.0* 23.9* 23.1*  27.0*  PLT 27*   < > 30*  --  86*  --   --  66*  --   --  56* 47*  APTT 45*   < > 46*  --   --  50*  --  47*  --   --   --  50*  LABPROT 17.7*  --  18.2*  --   --   --   --  18.5*  --   --   --   --   INR 1.4*  --  1.5*  --   --   --   --  1.5*  --   --   --   --   CREATININE 1.23   < > 1.11  --  1.18  --   --  1.16  --   --   --  1.18   < > = values in this interval not displayed.    Estimated Creatinine Clearance: 57.8 mL/min (by C-G formula based on SCr of 1.18 mg/dL).   Assessment: 62 yom underwent Bentall/AVR, TV repair, re-implantation of SVG-RCA complicated with vasoplegia and pulmonary edema requiring central VA ECMO. On 9/24 patient underwent transition VA to VV ECMO.   aPTT 50 on 0.0.14 mg/kg/hr bivalirudin.  Therapeutic.    Goal of Therapy:  aPTT 50-70 seconds Monitor platelets by anticoagulation protocol: Yes   Plan:  Continue bivalirudin at 0.014 mg/kg/hr  Monitor q12 hr aPTT and CBC, LDH, fibrinogen and for s/sx of bleeding.  Thank you for allowing pharmacy to participate in this patient's care,  Calton Dach, PharmD, BCCCP Clinical Pharmacist 06/08/2023 5:54 PM

## 2023-06-08 NOTE — Progress Notes (Signed)
[facility]    Date of Admission:  05/15/2023      ID: [name] is a 62 y.o. male with   Principal Problem:   Cardiogenic shock (HCC) Active Problems:   CAD (coronary artery disease)   Type 2 diabetes mellitus without complication, without long-term current use of insulin (HCC)   OSA on CPAP   Symptomatic anemia   Acute on chronic congestive heart failure (HCC)   Prosthetic aortic valve failure   Acute respiratory failure with hypoxia (HCC)   Anemia   Protein-calorie malnutrition, severe    Subjective: Afebrile but having marked increase in pressors needs to day, has increase LA in serial testing. No increased secretions, abdomen is soft  Medications:   sodium chloride   Intravenous Once   sodium chloride   Intravenous Once   artificial tears   Both Eyes Q8H   bisacodyl  10 mg Rectal Daily   Chlorhexidine Gluconate Cloth  6 each Topical Daily   darbepoetin (ARANESP) injection - DIALYSIS  40 mcg Subcutaneous Q Wed-1800   insulin aspart  0-20 Units Subcutaneous Q4H   insulin aspart  5 Units Subcutaneous Q4H   insulin detemir  25 Units Subcutaneous BID   methylPREDNISolone (SOLU-MEDROL) injection  120 mg Intravenous Q24H   mouth rinse  15 mL Mouth Rinse Q2H   pantoprazole (PROTONIX) IV  40 mg Intravenous QHS   sodium chloride flush  3 mL Intravenous Q12H    Objective: Vital signs in last 24 hours: Temp:  [98.1 F (36.7 C)-98.4 F (36.9 C)] 98.2 F (36.8 C) (10/02 1655) Pulse Rate:  [64-78] 67 (10/02 1700) Resp:  [0-34] 24 (10/02 1700) BP: (106-123)/(61-66) 123/66 (10/02 1608) SpO2:  [99 %-100 %] 100 % (10/02 1700) Arterial Line BP: (55-142)/(45-74) 100/53 (10/02 1700) FiO2 (%):  [50 %] 50 % (10/02 1608) Weight:  [76.1 kg] 76.1 kg (10/02 0500)  Physical Exam  Constitutional: He appears well-developed and well-nourished. No distress.  HENT: trach in place Mouth/Throat: Oropharynx is clear and moist. No oropharyngeal exudate.   Cardiovascular: Normal rate, regular rhythm and normal heart sounds. Exam reveals no gallop and no friction rub.  No murmur heard. Lines are c/d/i Pulmonary/Chest: Effort normal and breath sounds normal. No respiratory distress. He has no wheezes.mediastinal drain in place  Abdominal: Soft. Bowel sounds are normal. He exhibits no distension. There is no tenderness.  Neurological: He is alert and oriented to person, place, and time.  Skin: Skin is warm and dry. No rash noted. No erythema.  Ext : ischemic toes    Lab Results Recent Labs    06/07/23 1635 06/07/23 1824 06/08/23 0424 06/08/23 0432 06/08/23 0739 06/08/23 0953 06/08/23 1137  WBC 50.0*  --  53.0*  --   --   --  51.1*  HGB 7.9*   < > 7.7*   < > 8.8* 9.2* 8.0*  HCT 22.7*   < > 23.3*   < > 26.0* 27.0* 23.9*  NA 133*   < > 133*   < > 136 136  --   K 4.6   < > 4.6   < > 4.6 3.9  --   CL 98  --  100  --   --   --   --   CO2 21*  --  22  --   --   --   --   BUN 40*  --  45*  --   --   --   --  CREATININE 1.18  --  1.16  --   --   --   --    < > = values in this interval not displayed.   Liver Panel Recent Labs    06/07/23 0354 06/07/23 1635 06/08/23 0424  PROT 8.0  --  8.5*  ALBUMIN 2.0*  2.0* 2.1* 2.3*  2.2*  AST 109*  --  98*  ALT 14  --  10  ALKPHOS 131*  --  135*  BILITOT 4.0*  --  4.1*  BILIDIR 2.4*  --  2.2*  IBILI 1.6*  --  1.9*   Sedimentation Rate Recent Labs    06/07/23 0800  ESRSEDRATE >140*   C-Reactive Protein No results for input(s): "CRP" in the last 72 hours.  Microbiology: C.albicans A.terreus Studies/Results: DG CHEST PORT 1 VIEW  Result Date: 06/08/2023 CLINICAL DATA:  Follow-up ECMO EXAM: PORTABLE CHEST 1 VIEW COMPARISON:  06/07/2023 FINDINGS: Tracheostomy remains in place. Nasogastric tube enters the stomach. Bilateral chest tubes remain in place. Right internal jugular central line tip in the SVC above the right atrium. ECMO device appears the same as seen yesterday. Left  subclavian central line tip at the innominate SVC junction. Widespread bilateral pulmonary opacity persists, denser on the right than the left. No new finding. IMPRESSION: No change since yesterday. Widespread bilateral pulmonary opacity, denser on the right than the left. ECMO device and other lines and tubes appears the same as seen yesterday. Electronically Signed   By: Paulina Fusi M.D.   On: 06/08/2023 10:18   DG Abd 1 View  Result Date: 06/07/2023 CLINICAL DATA:  Nasogastric tube placement. EXAM: ABDOMEN - 1 VIEW COMPARISON:  May 31, 2023. FINDINGS: Nasogastric tube tip is seen in expected position of proximal stomach. IMPRESSION: Nasogastric tube tip seen in expected position of proximal stomach. Electronically Signed   By: Lupita Raider M.D.   On: 06/07/2023 15:57   DG Chest Port 1 View  Result Date: 06/07/2023 CLINICAL DATA:  Tracheostomy status. EXAM: PORTABLE CHEST 1 VIEW COMPARISON:  Same day. FINDINGS: Tracheostomy is in grossly good position. Bilateral chest tubes are noted without pneumothorax. Nasogastric tube is seen entering stomach. Stable cardiomediastinal silhouette. ECMO device is noted. Bilateral lung opacities are again noted, right greater than left. IMPRESSION: Tracheostomy tube in grossly good position. Otherwise stable findings. Electronically Signed   By: Lupita Raider M.D.   On: 06/07/2023 15:56   DG CHEST PORT 1 VIEW  Result Date: 06/07/2023 CLINICAL DATA:  ECMO. EXAM: PORTABLE CHEST 1 VIEW COMPARISON:  Radiograph yesterday FINDINGS: Stable support apparatus including endotracheal tube, enteric tube, right-sided dialysis catheter, left central line, right-sided ECMO catheter, bilateral chest tubes. Heterogeneous bilateral lung opacities with equivocal improvement. Suspected small pleural effusions. No visible pneumothorax. IMPRESSION: 1. Stable support apparatus. 2. Heterogeneous bilateral lung opacities with equivocal improvement. Suspected small pleural  effusions. Electronically Signed   By: Narda Rutherford M.D.   On: 06/07/2023 10:38     Assessment/Plan: 62yoM with AVR/TV repaired and bentall with post op need for ECMO. Has been treated broadly for pneumonia, as well candidal/mold pneumonia. Currently on cefazolin (for hx of MSSA endocarditis in may) and micafungin. And on steroids  Currently having worsening pressor and lactic acidosis. Remains critically ill and cxr showing extensive infiltrates. No new recommendations to offer. Defer to pccm. Has poor prognosis.   [facility] Pager: [phone]  06/08/2023, 5:09 PM

## 2023-06-08 NOTE — Progress Notes (Signed)
NAME:  [name], MRN:  [id number], DOB:  05-16-1961, LOS: 21 ADMISSION DATE:  05/12/2023, CONSULTATION DATE:  9/11 REFERRING MD:  Dr. Izora Ribas, CHIEF COMPLAINT:  aortic valve dehiscence   History of Present Illness:  Patient is [name] w/ pertinent PMH CAD s/p CABG 2017 w/ AVR, HLD, HTN, prior CVA, T2DM, CKD4 was previously on HD presents to [facility] on 9/10 chest pain.  Patient recently admitted to [facility] on 5/25 w/ AKI and AMS. While in ED patient had PEA arrest w/ ROSC in about 8 minutes. Patient required dialysis on this admission. Also w/ MSSA bacteremia associated w/ tricuspid valve endocarditis. Patient transferred to [facility] on 5/25. Treated w/ 6 week course oxacillin and rifampin. This admission also suffered a R cerebellar CVA w/ MRI likely septic emboli and had cholangitis/pancreatitis requiring ERCP.  On 9/10 patient admitted to [facility] w/ chest pain, dyspnea, and BLE edema. BNP 2,097. CXR w/ pulm edema. Patient started on IV lasix infusion. Cards consulted. On 9/11 patient transferred to [facility] Patient's echo showing possible aortic valve dehiscence. Cultures repeated and started on rocephin. Patient transferred to ICU for TEE and general anesthesia and to start CRRT. PCCM consulted.  Pertinent  Medical History   Past Medical History:  Diagnosis Date   Allergy    Anemia    Anxiety    Baker's cyst of knee    Blood transfusion without reported diagnosis    as baby    Coronary artery disease    quadruple bypass - March 2016   GERD (gastroesophageal reflux disease)    Gouty arthritis    "real bad" (01/17/2013)   Heart murmur    Hypercholesteremia    Hypertension    MSSA bacteremia 01/29/2023   Myocardial infarction (HCC) 2017   PEA (Pulseless electrical activity) (HCC) 01/29/2023   Stroke (HCC)    Type II diabetes mellitus (HCC)     Significant Hospital Events: Including procedures, antibiotic start and stop dates in addition to other pertinent events    9/10 admitted 9/11 echo showing aortic valve dehiscence, confirmed by TEE.  9/11 started (back) on CRRT for volume overload and known CKD 4 9/12 breathing better after CRRT 9/13 to OR for Redo of aortic valve, tricuspid valve repair and ascending aortic root replacement w/ re-implantation of SVG to RCA. Post op TEE EF 55% RV mid-mod HK, AVR/TV repair stable. Could not come off CPB. Worse pulmonary edema. High dose pressors. Cannulated and started on VA ECMO w/ central cannulation  9/16 MDT ECMO rounds this AM, plans to remove more volume, 1 U PRBCs  9/18 bronch, clot and mucus plugs removed  2023/05/29 OR for washout  9/20 bronch 9/21 bronch 9/24 VA to VV, some bleeding and acidemia issues postop 9/25 ett exchange and bronch 10/1 trach  Interim History / Subjective:  Bps fluctuating a bit overnight. Otherwise stable flow/sweep/vent mechanics Receiving PRN dilaudid for agitation.  Objective   Blood pressure 104/61, pulse 70, temperature 98.2 F (36.8 C), resp. rate (!) 9, height 5\' 6"  (1.676 m), weight 76.1 kg, SpO2 100%. CVP:  [6 mmHg-12 mmHg] 9 mmHg  Vent Mode: PRVC FiO2 (%):  [40 %-100 %] 50 % Set Rate:  [26 bmp] 26 bmp Vt Set:  [250 mL] 250 mL PEEP:  [10 cmH20] 10 cmH20 Plateau Pressure:  [20 cmH20-30 cmH20] 28 cmH20   Intake/Output Summary (Last 24 hours) at 06/08/2023 0731 Last data filed at 06/08/2023 0700 Gross per 24 hour  Intake 3333.87  ml  Output 4532.8 ml  Net -1198.93 ml   Filed Weights   06/06/23 0500 06/07/23 0500 06/08/23 0500  Weight: 76.9 kg 76.2 kg 76.1 kg    Examination: No distress Pupils equal Lungs with scattered rhonci Heart sounds regular, sternotomy has some bloody drainage from superior edge Abd soft hypoactive BS Ext warm Minimal anasarca  Sugars up a bit with steroids Sweep 6, gas looks okay H/H stable Plts responded to transfusion but dropping CXR maybe slightly improved aeration on R LDH has been trending in right direction  Assessment  & Plan:  Post cardiotomy vasoplegia/cardioplegia w/ failure to wean from CPB- improved; 9/24 converted to VV Acute on chronic combined HF, Hx MSSA endocarditis- S/p AVR/Bentall and reimplantation of Coronaries and TV repair 06-01-23 Acute respiratory failure w/ hypoxia due to volume overload, with untreated OSA, and dense nonresolving R infiltrate growing aspergillus terreus CKD 4. Had recently been on HD- now on CRRT Circuit related hemolysis, thrombocytopenia consumptive- stable Hyperbilirubinemia TF intolerance- ongoing issue now on TPN CAD s/p CABG in 2017 w/ AVR HTN HLD Hx of CVA DMT2 Leukocytosis, multifactorial   - On ancef for presumed lingering IE - Micafungin for a. Terreus, duration per ID - Continue trial of steroids; if no improvement by 10/4 will DC - Continue full vent support; attempted to push this w/ tidal volume increases but lung compliance markedly worsened - Bival for AC - Sweep for pH 7.35-7.45 - Vasopressin for MAP 65 - Continue TPN while we await return of bowel function - Sedation PRN - Discussed above with AHF+TCTS+ECMO specialists   Best Practice (right click and "Reselect all SmartList Selections" daily)   Diet/type: TPN DVT prophylaxis: bival GI prophylaxis: PPI Lines: Central line, Dialysis Catheter, and Arterial Line Foley: yes Code Status:  full code Last date of multidisciplinary goals of care discussion [per TCTS and AHF]   This patient is critically ill with multiple organ system failure which requires frequent high complexity decision making, assessment, support, evaluation, and titration of therapies. This was completed through the application of advanced monitoring technologies and extensive interpretation of multiple databases. During this encounter critical care time was devoted to patient care services described in this note for 33 minutes.  Lorin Glass, MD 06/08/23 7:31 AM [facility]  For contact information,  see Amion. If no response to pager, please call PCCM consult pager. After hours, 7PM- 7AM, please call Elink.

## 2023-06-08 NOTE — Progress Notes (Signed)
Patient ID: [name], male   DOB: 1961/05/11, 62 y.o.   MRN: [id number]   S:  Seen and examined on CRRT; procedure supervised.  He had 4.3 liters UF over 10/1 with CRRT.   Goal UF has been keep even as tolerated for the last part of the night as there was concern he was dry; BP was labile.  No UOP is charted.  He continues on ECMO.  He had a trach and bronch yesterday.  ID transitioned him back to micafungin from amphotericin.  Spoke with ECMO specialist and RN.  He has been on levo at 10 mcg/min and off of epi due to ectopy per ECMO specialist. He has been on vaso at 0.03 units/min.   Review of systems:  Unable to obtain secondary to intubated and obtunded     O:BP 104/61   Pulse 69   Temp 98.2 F (36.8 C)   Resp (!) 30   Ht 5\' 6"  (1.676 m)   Wt 76.1 kg   SpO2 100%   BMI 27.08 kg/m   Intake/Output Summary (Last 24 hours) at 06/08/2023 0601 Last data filed at 06/08/2023 0500 Gross per 24 hour  Intake 3266.74 ml  Output 4506.8 ml  Net -1240.06 ml   Intake/Output: I/O last 3 completed shifts: In: 5642 [I.V.:3702.7; Blood:558; Other:30; IV Piggyback:1351.3] Out: 6689.4 [Other:150; Stool:96; Chest Tube:10]  Intake/Output this shift:  Total I/O In: 1159 [I.V.:933.9; IV Piggyback:225.1] Out: 1741 [Chest Tube:2] Weight change: -0.1 kg  General adult male in bed critically ill     HEENT normocephalic atraumatic  Neck supple trachea midline Lungs coarse mechanical breath sounds  Heart  On ECMO; S1S2  Abdomen soft nontender with limitation of obtunded; nondistended Extremities no pitting edema  Neuro - no continuous sedation GU no foley  Access right subclavian tunneled dialysis catheter in place    Recent Labs  Lab 06/02/23 0403 06/02/23 0433 06/03/23 0405 06/03/23 0410 06/04/23 0413 06/04/23 4098 06/05/23 0346 06/05/23 0347 06/05/23 1621 06/05/23 1631 06/06/23 0415 06/06/23 1191 06/06/23 1617 06/06/23 1627 06/07/23 0354 06/07/23 0727 06/07/23 1009  06/07/23 1635 06/07/23 1824 06/08/23 0014 06/08/23 0424 06/08/23 0432  NA 136  134*   < > 133*   < > 135  136   < > 135  135   < > 133*   < > 134*   < > 133*   < > 134* 135 136 133* 134* 135 133* 136  K 4.1  4.1   < > 4.6   < > 4.4  4.3   < > 4.2  4.2   < > 4.1   < > 4.1   < > 4.1   < > 4.1 4.2 3.9 4.6 4.6 4.2 4.6 4.3  CL 96*  96*   < > 97*   < > 101   < > 99  98  --  98  --  99  --  101  --  98  --   --  98  --   --  100  --   CO2 27  32   < > 27   < > 23   < > 22  23  --  22  --  22  --  20*  --  21*  --   --  21*  --   --  22  --   GLUCOSE 202*  202*   < > 230*   < > 196*   < > 173*  171*  --  206*  --  200*  --  191*  --  176*  --   --  296*  --   --  231*  --   BUN 23  23   < > 24*   < > 29*   < > 34*  34*  --  36*  --  37*  --  42*  --  37*  --   --  40*  --   --  45*  --   CREATININE 0.98  1.03   < > 0.93   < > 1.06   < > 1.13  1.11  --  1.11  --  1.23  --  1.27*  --  1.11  --   --  1.18  --   --  1.16  --   ALBUMIN 1.8*  1.8*   < > 1.9*   < > 2.0*  2.0*   < > 2.0*  2.0*  --  2.0*  --  2.0*  --  2.0*  --  2.0*  2.0*  --   --  2.1*  --   --  2.2*  --   CALCIUM 8.0*  7.8*   < > 8.1*   < > 8.2*   < > 8.6*  8.6*  --  8.5*  --  8.7*  --  8.2*  --  8.5*  --   --  8.3*  --   --  8.6*  --   PHOS 2.2*  2.2*   < > 3.2   < > 3.4   < > 3.1  --  3.5  --  3.3  --  3.4  --  3.5  --   --  4.6  --   --  3.9  --   AST 112*  --  111*  --  108*  --  116*  --   --   --  126*  --   --   --  109*  --   --   --   --   --   --   --   ALT 10  --  13  --  16  --  17  --   --   --  17  --   --   --  14  --   --   --   --   --   --   --    < > = values in this interval not displayed.   Liver Function Tests: Recent Labs  Lab 06/05/23 0346 06/05/23 1621 06/06/23 0415 06/06/23 1617 06/07/23 0354 06/07/23 1635 06/08/23 0424  AST 116*  --  126*  --  109*  --   --   ALT 17  --  17  --  14  --   --   ALKPHOS 92  --  122  --  131*  --   --   BILITOT 5.2*  --  4.3*  --  4.0*  --   --   PROT  7.7  --  8.4*  --  8.0  --   --   ALBUMIN 2.0*  2.0*   < > 2.0*   < > 2.0*  2.0* 2.1* 2.2*   < > = values in this interval not displayed.   No results for input(s): "LIPASE", "AMYLASE" in the last 168 hours. Recent Labs  Lab 06/03/23 0742 06/04/23 1229  AMMONIA 26 40*  CBC: Recent Labs  Lab 06/06/23 0415 06/06/23 0646 06/06/23 1617 06/06/23 1627 06/07/23 0354 06/07/23 0727 06/07/23 1635 06/07/23 1824 06/08/23 0014 06/08/23 0424 06/08/23 0432  WBC 46.5*  --  45.4*  --  52.4*  --  50.0*  --   --  53.0*  --   HGB 8.2*   < > 8.1*   < > 8.0*   < > 7.9*   < > 9.2* 7.7* 8.8*  HCT 25.2*   < > 24.2*   < > 23.9*   < > 22.7*   < > 27.0* 23.3* 26.0*  MCV 92.0  --  89.0  --  91.9  --  91.2  --   --  90.0  --   PLT 27*  --  29*  --  30*  --  86*  --   --  66*  --    < > = values in this interval not displayed.   Cardiac Enzymes: No results for input(s): "CKTOTAL", "CKMB", "CKMBINDEX", "TROPONINI" in the last 168 hours. CBG: Recent Labs  Lab 06/07/23 1235 06/07/23 1651 06/07/23 2000 06/08/23 0012 06/08/23 0435  GLUCAP 198* 277* 234* 242* 231*    Iron Studies: No results for input(s): "IRON", "TIBC", "TRANSFERRIN", "FERRITIN" in the last 72 hours. Studies/Results: DG Abd 1 View  Result Date: 06/07/2023 CLINICAL DATA:  Nasogastric tube placement. EXAM: ABDOMEN - 1 VIEW COMPARISON:  06-04-2023. FINDINGS: Nasogastric tube tip is seen in expected position of proximal stomach. IMPRESSION: Nasogastric tube tip seen in expected position of proximal stomach. Electronically Signed   By: Lupita Raider M.D.   On: 06/07/2023 15:57   DG Chest Port 1 View  Result Date: 06/07/2023 CLINICAL DATA:  Tracheostomy status. EXAM: PORTABLE CHEST 1 VIEW COMPARISON:  Same day. FINDINGS: Tracheostomy is in grossly good position. Bilateral chest tubes are noted without pneumothorax. Nasogastric tube is seen entering stomach. Stable cardiomediastinal silhouette. ECMO device is noted. Bilateral  lung opacities are again noted, right greater than left. IMPRESSION: Tracheostomy tube in grossly good position. Otherwise stable findings. Electronically Signed   By: Lupita Raider M.D.   On: 06/07/2023 15:56   DG CHEST PORT 1 VIEW  Result Date: 06/07/2023 CLINICAL DATA:  ECMO. EXAM: PORTABLE CHEST 1 VIEW COMPARISON:  Radiograph yesterday FINDINGS: Stable support apparatus including endotracheal tube, enteric tube, right-sided dialysis catheter, left central line, right-sided ECMO catheter, bilateral chest tubes. Heterogeneous bilateral lung opacities with equivocal improvement. Suspected small pleural effusions. No visible pneumothorax. IMPRESSION: 1. Stable support apparatus. 2. Heterogeneous bilateral lung opacities with equivocal improvement. Suspected small pleural effusions. Electronically Signed   By: Narda Rutherford M.D.   On: 06/07/2023 10:38   DG CHEST PORT 1 VIEW  Result Date: 06/06/2023 CLINICAL DATA:  ECMO. EXAM: PORTABLE CHEST 1 VIEW COMPARISON:  Radiograph yesterday FINDINGS: Stable support apparatus including endotracheal tube, enteric tube, left subclavian central line, right internal jugular dialysis catheter and large bore ECMO catheter, bilateral chest tubes and mediastinal drain. Stable lung volumes. Heterogeneous bilateral lung opacities without significant interval change, worse on the right. Small bilateral pleural effusions. No visible pneumothorax. IMPRESSION: 1. Stable support apparatus. 2. Heterogeneous bilateral lung opacities without significant interval change, worse on the right. 3. Small bilateral pleural effusions. Electronically Signed   By: Narda Rutherford M.D.   On: 06/06/2023 10:39    sodium chloride   Intravenous Once   artificial tears   Both Eyes Q8H   bisacodyl  10 mg Rectal Daily  Chlorhexidine Gluconate Cloth  6 each Topical Daily   dextrose  10 mL Intravenous Q24H   dextrose  10 mL Intravenous Q24H   insulin aspart  0-20 Units Subcutaneous Q4H    insulin aspart  5 Units Subcutaneous Q4H   insulin detemir  20 Units Subcutaneous QHS   insulin detemir  25 Units Subcutaneous Daily   methylPREDNISolone (SOLU-MEDROL) injection  120 mg Intravenous Q24H   mouth rinse  15 mL Mouth Rinse Q2H   pantoprazole (PROTONIX) IV  40 mg Intravenous QHS   sodium chloride  250 mL Intravenous Q24H   sodium chloride  250 mL Intravenous Q24H   sodium chloride flush  3 mL Intravenous Q12H    BMET    Component Value Date/Time   NA 136 06/08/2023 0432   NA 143 11/02/2022 1454   K 4.3 06/08/2023 0432   CL 100 06/08/2023 0424   CO2 22 06/08/2023 0424   GLUCOSE 231 (H) 06/08/2023 0424   BUN 45 (H) 06/08/2023 0424   BUN 31 (H) 11/02/2022 1454   CREATININE 1.16 06/08/2023 0424   CREATININE 2.71 (H) 05/19/2023 0950   CREATININE 1.05 02/24/2015 1601   CALCIUM 8.6 (L) 06/08/2023 0424   GFRNONAA >60 06/08/2023 0424   GFRNONAA 26 (L) 05/27/2023 0950   GFRNONAA 80 02/24/2015 1601   GFRAA 62 10/09/2020 1520   GFRAA >89 02/24/2015 1601   CBC    Component Value Date/Time   WBC 53.0 (HH) 06/08/2023 0424   RBC 2.59 (L) 06/08/2023 0424   HGB 8.8 (L) 06/08/2023 0432   HGB 6.4 (LL) 05/11/2023 0852   HGB 10.2 (L) 08/12/2022 0934   HCT 26.0 (L) 06/08/2023 0432   HCT 29.2 (L) 08/12/2022 0934   PLT 66 (L) 06/08/2023 0424   PLT 109 (L) 05/27/2023 0852   PLT 252 08/12/2022 0934   MCV 90.0 06/08/2023 0424   MCV 94 08/12/2022 0934   MCH 29.7 06/08/2023 0424   MCHC 33.0 06/08/2023 0424   RDW 17.5 (H) 06/08/2023 0424   RDW 15.5 (H) 08/12/2022 0934   LYMPHSABS 5.8 (H) 05/29/2023 1722   LYMPHSABS 1.9 11/04/2021 1028   MONOABS 6.3 (H) 05/29/2023 1722   EOSABS 0.0 05/29/2023 1722   EOSABS 0.2 11/04/2021 1028   BASOSABS 0.0 05/29/2023 1722   BASOSABS 0.1 11/04/2021 1028    Assessment/Plan:   Anuric AKI/dialysis dependent AKI with fluid overload: Started on RRT during previous admission in May 2024 until 05/10/23 (recovered). Patient is status post cardiac  surgery on 01-Jun-2023 and currently on ECMO.  CRRT start 9/11. No signs of renal recovery.   UF as tolerated.  Goal at present is keep even as  tolerated; please page nephrology for UF goal changes   All 4K fluids  On angiomax per team for anticoagulation.  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  Per CTS Shock: multifactorial including cardiogenic and septic, on pressors and ECMO per primary team. Right lung consolidation. Profound leukocytosis, followed by ID, heart failure team   Candida albicans pneumonia - also with concern for invasive mold such as aspergillis infection - antifungals per ID  Acute hypoxic respiratory failure: on vent and s/p trach on 10/1; per primary service. ECMO. Fungal PNA. Abx/antifungal per primary service and ID   Anemia, thrombocytopenia: Transfuse as needed per primary team.  Start aranesp 40 mcg every Wednesday.  Hypophosphatemia - repletion prn  Disposition - in ICU on  CRRT, ECMO   Estanislado Emms, MD 6:27 AM 06/08/2023

## 2023-06-08 NOTE — Progress Notes (Signed)
Patient ID: [name], male   DOB: 03/06/1961, 62 y.o.   MRN: [id number]     Advanced Heart Failure Rounding Note  PCP-Cardiologist: Kristeen Miss, MD   Subjective:    9/13: OR for Bentall/AVR, TV repair, re-implantation of SVG-RCA.  Post-op vasoplegia and pulmonary edema, required central [facility] ECMO (RA drainage, ascending aorta return.  Post-op TEE EF 55%, mild-moderate RV hypokinesis.  9/14: Blender added to circuit due to high PaO2 9/16: TEE with EF 30%, moderate LVH, moderate RV dysfunction with mild enlargement, stable bioprosthetic aortic valve, repaired TV with mild TR, no endocarditis noted.  9/18: Bronchoscopy with mucus plugs and clots. 9/19: To OR for mediastinal washout.  By TEE, EF 50% with moderate LVH, mild RV enlargement and mild RV dysfunction, stable bioprosthetic aortic valve, repaired TV with mild TR.  He did not tolerate lowering of ECMO flows even with increasing of pressors due to vasoplegia.  9/21: Bronchoscopy  BAL: growing candida and dold 9/24: Decannulated from [facility] ECMO. Chest closed. Transitioned to VV ECMO  On VV ECMO. Circuit stable.   - Negative 1.1L over the past 24h; now running even.  - No events overnight.  - Sitting up today; increasingly responsive.   Objective:   Weight Range: 76.1 kg Body mass index is 27.08 kg/m.   Vital Signs:   Temp:  [97.3 F (36.3 C)-98.4 F (36.9 C)] 98.2 F (36.8 C) (10/02 0400) Pulse Rate:  [64-94] 70 (10/02 0715) Resp:  [0-34] 9 (10/02 0715) SpO2:  [100 %] 100 % (10/02 0715) Arterial Line BP: (75-142)/(48-74) 108/61 (10/02 0715) FiO2 (%):  [40 %-100 %] 50 % (10/02 0313) Weight:  [76.1 kg] 76.1 kg (10/02 0500) Last BM Date : 06/07/23  Weight change: Filed Weights   06/06/23 0500 06/07/23 0500 06/08/23 0500  Weight: 76.9 kg 76.2 kg 76.1 kg    Intake/Output:   Intake/Output Summary (Last 24 hours) at 06/08/2023 0757 Last data filed at 06/08/2023 0700 Gross per 24 hour  Intake 3333.87 ml  Output 4532.8 ml   Net -1198.93 ml      Physical Exam    General:  Critically-ill appearing. On vent.  HEENT: + ETT Neck:  +VV ECMO cannula + swan Cor: Chest dressing. + CTs   Lungs: mild improvement in lung sounds Abdomen: soft; nontender.  Extremities: no cyanosis, clubbing, rash, no edema Neuro: intubated/sedated  Telemetry   Junctional rhythm + PVCs Personally reviewed  Labs    CBC Recent Labs    06/07/23 1635 06/07/23 1824 06/08/23 0424 06/08/23 0432 06/08/23 0739  WBC 50.0*  --  53.0*  --   --   HGB 7.9*   < > 7.7* 8.8* 8.8*  HCT 22.7*   < > 23.3* 26.0* 26.0*  MCV 91.2  --  90.0  --   --   PLT 86*  --  66*  --   --    < > = values in this interval not displayed.   Basic Metabolic Panel Recent Labs    02/72/53 0415 06/06/23 0646 06/07/23 0354 06/07/23 0727 06/07/23 1635 06/07/23 1824 06/08/23 0424 06/08/23 0432 06/08/23 0739  NA 134*   < > 134*   < > 133*   < > 133* 136 136  K 4.1   < > 4.1   < > 4.6   < > 4.6 4.3 4.6  CL 99   < > 98  --  98  --  100  --   --   CO2 22   < >  21*  --  21*  --  22  --   --   GLUCOSE 200*   < > 176*  --  296*  --  231*  --   --   BUN 37*   < > 37*  --  40*  --  45*  --   --   CREATININE 1.23   < > 1.11  --  1.18  --  1.16  --   --   CALCIUM 8.7*   < > 8.5*  --  8.3*  --  8.6*  --   --   MG 2.4  --  2.4  --   --   --   --   --   --   PHOS 3.3   < > 3.5  --  4.6  --  3.9  --   --    < > = values in this interval not displayed.   Liver Function Tests Recent Labs    06/07/23 0354 06/07/23 1635 06/08/23 0424  AST 109*  --  98*  ALT 14  --  10  ALKPHOS 131*  --  135*  BILITOT 4.0*  --  4.1*  PROT 8.0  --  8.5*  ALBUMIN 2.0*  2.0* 2.1* 2.3*  2.2*   BNP: BNP (last 3 results) Recent Labs    06/05/2023 1100  BNP 2,097.0*     Imaging    DG Abd 1 View  Result Date: 06/07/2023 CLINICAL DATA:  Nasogastric tube placement. EXAM: ABDOMEN - 1 VIEW COMPARISON:  May 31, 2023. FINDINGS: Nasogastric tube tip is seen in expected  position of proximal stomach. IMPRESSION: Nasogastric tube tip seen in expected position of proximal stomach. Electronically Signed   By: Lupita Raider M.D.   On: 06/07/2023 15:57   DG Chest Port 1 View  Result Date: 06/07/2023 CLINICAL DATA:  Tracheostomy status. EXAM: PORTABLE CHEST 1 VIEW COMPARISON:  Same day. FINDINGS: Tracheostomy is in grossly good position. Bilateral chest tubes are noted without pneumothorax. Nasogastric tube is seen entering stomach. Stable cardiomediastinal silhouette. ECMO device is noted. Bilateral lung opacities are again noted, right greater than left. IMPRESSION: Tracheostomy tube in grossly good position. Otherwise stable findings. Electronically Signed   By: Lupita Raider M.D.   On: 06/07/2023 15:56     Medications:     Scheduled Medications:  sodium chloride   Intravenous Once   artificial tears   Both Eyes Q8H   bisacodyl  10 mg Rectal Daily   Chlorhexidine Gluconate Cloth  6 each Topical Daily   darbepoetin (ARANESP) injection - DIALYSIS  40 mcg Subcutaneous Q Wed-1800   insulin aspart  0-20 Units Subcutaneous Q4H   insulin aspart  5 Units Subcutaneous Q4H   insulin detemir  20 Units Subcutaneous QHS   insulin detemir  25 Units Subcutaneous Daily   methylPREDNISolone (SOLU-MEDROL) injection  120 mg Intravenous Q24H   mouth rinse  15 mL Mouth Rinse Q2H   pantoprazole (PROTONIX) IV  40 mg Intravenous QHS   sodium chloride  250 mL Intravenous Q24H   sodium chloride  250 mL Intravenous Q24H   sodium chloride flush  3 mL Intravenous Q12H    Infusions:   prismasol BGK 4/2.5 400 mL/hr at 06/08/23 0458    prismasol BGK 4/2.5 400 mL/hr at 06/08/23 0653   sodium chloride Stopped (05/24/23 0032)   sodium chloride Stopped (06/06/23 0237)   albumin human Stopped (05/30/23 0819)   anticoagulant sodium citrate  bivalirudin (ANGIOMAX) 250 mg in sodium chloride 0.9 % 500 mL (0.5 mg/mL) infusion 0.014 mg/kg/hr (06/08/23 0700)    ceFAZolin (ANCEF) IV  Stopped (06/07/23 2142)   epinephrine Stopped (06/07/23 2327)   micafungin (MYCAMINE) 200 mg in sodium chloride 0.9 % 100 mL IVPB Stopped (06/07/23 2102)   norepinephrine (LEVOPHED) Adult infusion 10 mcg/min (06/08/23 0700)   prismasol BGK 4/2.5 1,500 mL/hr at 06/08/23 0543   TPN ADULT (ION) 75 mL/hr at 06/08/23 0700   vasopressin 0.03 Units/min (06/08/23 0700)    PRN Medications: sodium chloride, sodium chloride, acetaminophen, acetaminophen, albumin human, anticoagulant sodium citrate, artificial tears, dextrose, diphenhydrAMINE **OR** diphenhydrAMINE, HYDROmorphone (DILAUDID) injection, meperidine (DEMEROL) injection, midazolam, mouth rinse, oxidized cellulose, sodium chloride flush    Assessment/Plan   1. Post-cardiotomy vasoplegia/shock with failure to wean from CPB: Now on central [facility] ECMO (05/15/2023) with RA drainage/ascending aorta return. Post-op echo with EF 55%, mild-moderate RV dysfunction. Unable to get images yesterday by TTE.  TEE on 9/16 showed EF 30%, moderate LVH, moderate RV dysfunction with mild enlargement, stable bioprosthetic aortic valve, repaired TV with mild TR, no endocarditis noted.  To OR for mediastinal washout on 9/19.  By TEE 9/19, EF 50% with moderate LVH, mild RV enlargement and mild RV dysfunction, stable bioprosthetic aortic valve, repaired TV with mild TR.  He did not tolerate lowering of ECMO flows even with increasing of pressors due to vasoplegia. CXR with clearing left lung, right lung with persistent airspace disease not clearing with CVVH. Switch from [place] ECMO to VV ECMO with chest closure on 06/06/2023 - Negative 1.1L over the past 24h; hypotensive overnight. Now on levophed 10 and vasopressin. Sitting up today. Responding to commands. Mild improvement in CXR infiltrates.  - Even on CRRT today - Mild increase in lactic acid; will repeat today.  - LDH mildly elevated. Continue bival. Discussed dosing with PharmD personally. - Prognosis remains guarded.   2.  Acute hypoxic respiratory failure, post-op: On vent, rest settings. Blender added to keep PaO2 down. Sweep remains 4.5. CXR with some clearing of left lung, right lung with ongoing diffuse airspace disease without change.  Suspect extensive PNA, think we have done what we can with CVVH for lung clearing at this point. Bronchoscopy yesterday with no change in CXR.  - Continue VV ECMO  - R lung consolidation is severe. BAL from 9/20 + candida and mold - Meropenem/vancomycin/micafungin/amphotericin for coverage of PNA - CT chest with extensive infiltrates. - Now s/p trach - D/w CCM  3. Severe prosthetic AI: Due to previous MSSA endocarditis with partial valve dehiscence s/p re-do AVR/Bentall with reimplantation of SVG-RCA and TV repair 05/30/2023.   - Continuing meropenem/vancomycin/micafungin/amphotericin.  4. CAD s/p CABG/AVR 2017:  s/p AVR/Bentall and re-implantation of SVG-RCA 06/04/2023. - Off ASA with platelets down.   5. ESRD: CVVH as above. Keep negative  6. DM2: management per CCM  7. Heart block: Post-op, now in stable junctional rhythm.    8. H/o CVA: Prior cerebellar CVA.   9. Thrombocytopenia: monitoring. Suspect inflammatory and hemolysis.   10. Acute blood loss anemia: Post-op, transfuse hgb < 8.   11. ID: H/o MSSA bacteremia, respiratory cultures with mold.    - Meropenem + vancomycin - Micafungin for fungal coverage added 9/20 - Started amphotericin 06/04/23 - IV solumedrol 120mg  today.   12. FEN: Receiving Tfs  13. Liver failure, post-op  CRITICAL CARE Performed by: Dorthula Nettles   Total critical care time: 35 minutes  Critical care time was exclusive of  separately billable procedures and treating other patients.  Critical care was necessary to treat or prevent imminent or life-threatening deterioration.  Critical care was time spent personally by me on the following activities: development of treatment plan with patient and/or surrogate as well as nursing,  discussions with consultants, evaluation of patient's response to treatment, examination of patient, obtaining history from patient or surrogate, ordering and performing treatments and interventions, ordering and review of laboratory studies, ordering and review of radiographic studies, pulse oximetry and re-evaluation of patient's condition.   Length of Stay: [address] DO  06/08/2023, 7:57 AM  Advanced Heart Failure Team Pager [phone] (M-F; 7a - 5p)  Please contact [facility] for night-coverage after hours (5p -7a ) and weekends on [email or web address]

## 2023-06-08 NOTE — Progress Notes (Signed)
06/08/2023 Rising lactate in context of rising pressor need.  H/H borderline. Discussed utility of repeat CT with TCTS and AHF: not operative candidate and risks of transport are not negligible: hold for now.  Give some blood and see how settles through night.  If continues to deteriorate may be at EOL.  Myrla Halsted MD PCCM

## 2023-06-08 NOTE — Progress Notes (Signed)
8 Days Post-Op Procedure(s) (LRB): CONVERSION TO VV ECMO (EXTRACORPOREAL MEMBRANE OXYGENATION) (N/A) DECANNULATION FOR ECMO (N/A) TRANSESOPHAGEAL ECHOCARDIOGRAM (N/A) Subjective:  Has required increase vasopressors today to support BP increased NE to 15 mcg and vaso to 0.04. Subsequent increase in lactic acid from 2.2 this am to 5.9 this afternoon and down to 3.3 later this afternoon.   Objective: Vital signs in last 24 hours: Temp:  [98.1 F (36.7 C)-98.4 F (36.9 C)] 98.2 F (36.8 C) (10/02 1827) Pulse Rate:  [63-78] 69 (10/02 1827) Cardiac Rhythm: Junctional rhythm;Bundle branch block (10/02 1600) Resp:  [0-34] 26 (10/02 1827) BP: (106-123)/(61-66) 123/66 (10/02 1608) SpO2:  [99 %-100 %] 100 % (10/02 1827) Arterial Line BP: (55-142)/(45-74) 99/55 (10/02 1827) FiO2 (%):  [50 %] 50 % (10/02 1608) Weight:  [76.1 kg] 76.1 kg (10/02 0500)  Hemodynamic parameters for last 24 hours: CVP:  [3 mmHg-7 mmHg] 4 mmHg  Intake/Output from previous day: 10/01 0701 - 10/02 0700 In: 3333.9 [I.V.:2295.2; Blood:558; IV Piggyback:480.7] Out: 4532.8 [Stool:30; Chest Tube:2] Intake/Output this shift: No intake/output data recorded.  General appearance: trached on vent.  Neurologic: more alert today by report Heart: regular rate and rhythm, S1, S2 normal, no murmur Lungs: coarse bilat Abdomen: soft, non-tender; no sounds normal Extremities: no edema Wound: incision ok. Some serosanguinous drainage inferiorly  Lab Results: Recent Labs    06/08/23 1137 06/08/23 1638  WBC 51.1* 54.4*  HGB 8.0* 7.9*  9.2*  HCT 23.9* 23.1*  27.0*  PLT 56* 47*   BMET:  Recent Labs    06/08/23 0424 06/08/23 0432 06/08/23 0953 06/08/23 1638  NA 133*   < > 136 134*  136  K 4.6   < > 3.9 4.0  4.1  CL 100  --   --  100  CO2 22  --   --  21*  GLUCOSE 231*  --   --  259*  BUN 45*  --   --  48*  CREATININE 1.16  --   --  1.18  CALCIUM 8.6*  --   --  8.6*   < > = values in this interval not  displayed.    PT/INR:  Recent Labs    06/08/23 0424  LABPROT 18.5*  INR 1.5*   ABG    Component Value Date/Time   PHART 7.398 06/08/2023 1638   HCO3 22.7 06/08/2023 1638   TCO2 24 06/08/2023 1638   ACIDBASEDEF 2.0 06/08/2023 1638   O2SAT 100 06/08/2023 1638   CBG (last 3)  Recent Labs    06/08/23 0737 06/08/23 1137 06/08/23 1648  GLUCAP 276* 247* 249*   CXR: stable bilateral opacities R>L  Assessment/Plan: S/P Procedure(s) (LRB): CONVERSION TO VV ECMO (EXTRACORPOREAL MEMBRANE OXYGENATION) (N/A) DECANNULATION FOR ECMO (N/A) TRANSESOPHAGEAL ECHOCARDIOGRAM (N/A)  Transfusing to volume expand and hopefully be able to wean down NE which may be exacerbating lactic acidosis. Continuing VV ECMO per CCM and AHF teams.  Continuing cefazolin for hx of MSSA endocarditis in May. Micafungin for yeast/mold in BAL cultures. ID following.  CRRT to keep even. I think we need to avoid volume contraction hypotension leading to increased pressor requirement. I think this may be leading to more lactic acidosis.  It is certainly possible that he could have some intestinal ischemia/necrosis responsible for increased lactic acid but his abdomen seem benign with no free air on CXR. I don't think he would be a candidate for any surgical treatment for that anyway so the best we can do is avoid  too much vasopressor and expand volume some.  I discussed status and critical condition with his son and father and they understand that he very well may not recover from this.   LOS: 21 days    Alleen Borne 06/08/2023

## 2023-06-08 NOTE — Progress Notes (Signed)
Date and time results received: 06/08/23 0958 Test: lactic acid Critical Value: 4.5 Name of Provider Notified: Katrinka Blazing MD and Gasper Lloyd MD  Orders Received? Or Actions Taken?: none

## 2023-06-08 NOTE — Progress Notes (Signed)
PHARMACY - TOTAL PARENTERAL NUTRITION CONSULT NOTE   Indication:  unable to tolerate tube feeding  Patient Measurements: Height: 5\' 6"  (167.6 cm) Weight: 76.1 kg (167 lb 12.3 oz) IBW/kg (Calculated) : 63.8 TPN AdjBW (KG): 69 Body mass index is 27.08 kg/m. Usual Weight: ~90kg  Assessment: 62 yo male with significant PMH for HLD, HTN, T2DM, CKD4 (prev on HD) and recent MSSA bacteremia + endocarditis (May 2024). Back in May 2024 admission, underwent ERCP for cholangitis/pancreatitis. Admitted on 9/11 w/ chest pain found to have aortic valve dehiscence. S/p aortic valve replacement redo on 9/13 - developed pulmonary edema post op and was placed on [place] ECMO, transitioned to VV ECMO 9/24. S/p mediastinal washout on 9/19 - chest still open. Unable to speak w/ patient due to intubation (utilizing Precedex for sedation). Pharmacy consulted to manage TPN for inability to tolerate tube feeding and malnutrition.   Glucose / Insulin: hx T2DM - A1c 6.1%, on Farxiga PTA. BG 177-276. Used 70 units rSSI used in 24 hours. Detemir increase to 25u QAM and 20u QPM but missed PM dose >> will give total of 35 units this morning  -10/1 started steroid course  Electrolytes: K 4.6 - (none in TPN), Mg 2.4 - trending up (none in TPN), CoCa 10/iCa 1.18, others wnl Renal: Hx CKD4 on CRRT since 9/11 (required HD in the past), 4K bags, systemic bivalirudin for anticoag - not using systemic sodium citrate infusion, only for catheter lock per d/w RN at bedside on 9/24; BUN 45 Hepatic: Alk phos 135, AST 98- trending down, ALT wnl, Tbili 4.1, albumin 2.2, TG 188  Intake / Output:  Stool 30 mL, receiving Reglan 5mg  IV q6h - Chest tube 2 mL - CRRT removed 4500 mL  GI Imaging:  9/27 CT A/P : mild wall thickening in several small bowel loops suggesting enteritis, cholelithiasis GI Surgeries / Procedures:  9/24 transition to VV ECMO 9/20 Cortrak placed  9/22 NGT placed  Central access: 9/13 TPN start date: 9/23  Nutritional  Goals: Goal concentrated TPN rate is 75 mL/hr (provides 140 g of protein and 2255 kcals per day)  RD Assessment: Estimated Needs Total Energy Estimated Needs: 2200-2400 Total Protein Estimated Needs: 130-150 grams Total Fluid Estimated Needs: 1L plus UOP  Current Nutrition:  NPO and TPN  Plan:  Continue concentrated TPN at 75 mL/hr at 1800 - meeting ~100% of needs Electrolytes in TPN: Na 125 mEq/L, K 0 mEq/L, Ca 0 mEq/L, Mg 0 mEq/L, and Phos 20 mmol/L. AO:ZHYQ to 1:2  Standard MVI and trace elements within TPN No chromium (on CRRT and currently on shortage) Continue Resistant q4h SSI + aspart 5 units q4h  Increase insulin detemir to 25 units BID, additional 10 units x1 10/2 AM  Continue M[facility]F at [facility] Monitor TPN labs on Mon/Thurs, RFP BID while on CRRT  F/u steroid plan and consider adding insulin to TPN after steroids off F/u TF restart   Alphia Moh, PharmD, BCPS, BCCP Clinical Pharmacist  Please check AMION for all [facility] Pharmacy phone numbers After 10:00 PM, call Main Pharmacy [phone]

## 2023-06-08 NOTE — Progress Notes (Signed)
ANTICOAGULATION CONSULT NOTE - Follow Up Consult  Pharmacy Consult for bivalirudin Indication:  ECMO  Labs: Recent Labs    06/06/23 0415 06/06/23 0646 06/07/23 0354 06/07/23 0727 06/07/23 1635 06/07/23 1817 06/07/23 1824 06/08/23 0014 06/08/23 0424 06/08/23 0432  HGB 8.2*   < > 8.0*   < > 7.9*  --    < > 9.2* 7.7* 8.8*  HCT 25.2*   < > 23.9*   < > 22.7*  --    < > 27.0* 23.3* 26.0*  PLT 27*   < > 30*  --  86*  --   --   --  66*  --   APTT 45*   < > 46*  --   --  50*  --   --  47*  --   LABPROT 17.7*  --  18.2*  --   --   --   --   --  18.5*  --   INR 1.4*  --  1.5*  --   --   --   --   --  1.5*  --   CREATININE 1.23   < > 1.11  --  1.18  --   --   --  1.16  --    < > = values in this interval not displayed.    Assessment: 63yo male now subtherapeutic on bivalirudin after one PTT at goal; no infusion issues or signs of bleeding per RN.  Goal of Therapy:  aPTT 50-70 seconds   Plan:  Increase bival infusion by 20% to 0.014 mg/kg/hr. Check PTT with next scheduled labs.   Vernard Gambles, PharmD, BCPS 06/08/2023 5:27 AM

## 2023-06-08 NOTE — Progress Notes (Signed)
Date and time results received: 06/08/23 1405 Test: lactic acid Critical Value: 5.9 Name of Provider Notified: Katrinka Blazing MD and Gasper Lloyd MD Orders Received? Or Actions Taken?: 2 pRBCs

## 2023-06-08 NOTE — Progress Notes (Signed)
Patient ID: [name], male   DOB: 03-26-1961, 62 y.o.   MRN: [id number]   Extracorporeal support note   ECLS support day: 8 (V-V) (Day 20 of ECLS support) Indication: Acute hypoxic respiratory failure  Configuration: [place] VV ECMO  30FR Dual-lumen Crescent cannula in RIJ   Pump speed: 3400rpm  Pump flow: 3.5 L/min Pump used: Cardiohelp  Sweep gas: 6.5  ABG: 7.37/39/1213 LDH: pending  Circuit check: Small fibrinous clot at 12p Anticoagulant: On bival Anticoagulation targets:  PTT 50-60  Changes in support: none  Anticipated goals/duration of support: Wean VV ECMO as able; improvement in CXR today. Sitting up and increasingly responsive.  Eliezer Lofts Ilsa Bonello DO  06/08/2023, 8:11 AM

## 2023-06-09 ENCOUNTER — Inpatient Hospital Stay (HOSPITAL_COMMUNITY): Payer: PPO

## 2023-06-09 DIAGNOSIS — R570 Cardiogenic shock: Secondary | ICD-10-CM | POA: Diagnosis not present

## 2023-06-09 LAB — POCT I-STAT 7, (LYTES, BLD GAS, ICA,H+H)
Acid-Base Excess: 0 mmol/L (ref 0.0–2.0)
Acid-Base Excess: 1 mmol/L (ref 0.0–2.0)
Acid-base deficit: 1 mmol/L (ref 0.0–2.0)
Acid-base deficit: 1 mmol/L (ref 0.0–2.0)
Acid-base deficit: 1 mmol/L (ref 0.0–2.0)
Bicarbonate: 24 mmol/L (ref 20.0–28.0)
Bicarbonate: 23.5 mmol/L (ref 20.0–28.0)
Bicarbonate: 24.6 mmol/L (ref 20.0–28.0)
Bicarbonate: 23.9 mmol/L (ref 20.0–28.0)
Bicarbonate: 23 mmol/L (ref 20.0–28.0)
Calcium, Ion: 1.18 mmol/L (ref 1.15–1.40)
Calcium, Ion: 1.15 mmol/L (ref 1.15–1.40)
Calcium, Ion: 1.18 mmol/L (ref 1.15–1.40)
Calcium, Ion: 1.17 mmol/L (ref 1.15–1.40)
Calcium, Ion: 1.16 mmol/L (ref 1.15–1.40)
HCT: 31 % — ABNORMAL LOW (ref 39.0–52.0)
HCT: 31 % — ABNORMAL LOW (ref 39.0–52.0)
HCT: 32 % — ABNORMAL LOW (ref 39.0–52.0)
HCT: 30 % — ABNORMAL LOW (ref 39.0–52.0)
HCT: 29 % — ABNORMAL LOW (ref 39.0–52.0)
Hemoglobin: 10.5 g/dL — ABNORMAL LOW (ref 13.0–17.0)
Hemoglobin: 10.5 g/dL — ABNORMAL LOW (ref 13.0–17.0)
Hemoglobin: 10.9 g/dL — ABNORMAL LOW (ref 13.0–17.0)
Hemoglobin: 10.2 g/dL — ABNORMAL LOW (ref 13.0–17.0)
Hemoglobin: 9.9 g/dL — ABNORMAL LOW (ref 13.0–17.0)
O2 Saturation: 100 %
O2 Saturation: 100 %
O2 Saturation: 100 %
O2 Saturation: 100 %
O2 Saturation: 100 %
Patient temperature: 36.9
Patient temperature: 36.8
Patient temperature: 36.9
Patient temperature: 36.9
Patient temperature: 36.9
Potassium: 3.5 mmol/L (ref 3.5–5.1)
Potassium: 3.3 mmol/L — ABNORMAL LOW (ref 3.5–5.1)
Potassium: 3.4 mmol/L — ABNORMAL LOW (ref 3.5–5.1)
Potassium: 3.8 mmol/L (ref 3.5–5.1)
Potassium: 3.6 mmol/L (ref 3.5–5.1)
Sodium: 137 mmol/L (ref 135–145)
Sodium: 137 mmol/L (ref 135–145)
Sodium: 137 mmol/L (ref 135–145)
Sodium: 137 mmol/L (ref 135–145)
Sodium: 138 mmol/L (ref 135–145)
TCO2: 25 mmol/L (ref 22–32)
TCO2: 24 mmol/L (ref 22–32)
TCO2: 26 mmol/L (ref 22–32)
TCO2: 25 mmol/L (ref 22–32)
TCO2: 24 mmol/L (ref 22–32)
pCO2 arterial: 36.6 mm[Hg] (ref 32–48)
pCO2 arterial: 30.6 mm[Hg] — ABNORMAL LOW (ref 32–48)
pCO2 arterial: 42.1 mm[Hg] (ref 32–48)
pCO2 arterial: 38.9 mm[Hg] (ref 32–48)
pCO2 arterial: 34.5 mm[Hg] (ref 32–48)
pH, Arterial: 7.424 (ref 7.35–7.45)
pH, Arterial: 7.493 — ABNORMAL HIGH (ref 7.35–7.45)
pH, Arterial: 7.374 (ref 7.35–7.45)
pH, Arterial: 7.397 (ref 7.35–7.45)
pH, Arterial: 7.431 (ref 7.35–7.45)
pO2, Arterial: 235 mm[Hg] — ABNORMAL HIGH (ref 83–108)
pO2, Arterial: 256 mm[Hg] — ABNORMAL HIGH (ref 83–108)
pO2, Arterial: 226 mm[Hg] — ABNORMAL HIGH (ref 83–108)
pO2, Arterial: 236 mm[Hg] — ABNORMAL HIGH (ref 83–108)
pO2, Arterial: 249 mm[Hg] — ABNORMAL HIGH (ref 83–108)

## 2023-06-09 LAB — GLUCOSE, CAPILLARY
Glucose-Capillary: 178 mg/dL — ABNORMAL HIGH (ref 70–99)
Glucose-Capillary: 229 mg/dL — ABNORMAL HIGH (ref 70–99)
Glucose-Capillary: 243 mg/dL — ABNORMAL HIGH (ref 70–99)
Glucose-Capillary: 244 mg/dL — ABNORMAL HIGH (ref 70–99)
Glucose-Capillary: 262 mg/dL — ABNORMAL HIGH (ref 70–99)
Glucose-Capillary: 275 mg/dL — ABNORMAL HIGH (ref 70–99)

## 2023-06-09 LAB — RENAL FUNCTION PANEL
Albumin: 2.4 g/dL — ABNORMAL LOW (ref 3.5–5.0)
Anion gap: 14 (ref 5–15)
BUN: 51 mg/dL — ABNORMAL HIGH (ref 8–23)
CO2: 22 mmol/L (ref 22–32)
Calcium: 8.4 mg/dL — ABNORMAL LOW (ref 8.9–10.3)
Chloride: 99 mmol/L (ref 98–111)
Creatinine, Ser: 1.12 mg/dL (ref 0.61–1.24)
GFR, Estimated: >60 mL/min (ref >60)
Glucose, Bld: 283 mg/dL — ABNORMAL HIGH (ref 70–99)
Phosphorus: 3.2 mg/dL (ref 2.5–4.6)
Potassium: 4.1 mmol/L (ref 3.5–5.1)
Sodium: 135 mmol/L (ref 135–145)

## 2023-06-09 LAB — COMPREHENSIVE METABOLIC PANEL
ALT: 12 U/L (ref 0–44)
AST: 100 U/L — ABNORMAL HIGH (ref 15–41)
Albumin: 2.2 g/dL — ABNORMAL LOW (ref 3.5–5.0)
Alkaline Phosphatase: 129 U/L — ABNORMAL HIGH (ref 38–126)
Anion gap: 16 — ABNORMAL HIGH (ref 5–15)
BUN: 51 mg/dL — ABNORMAL HIGH (ref 8–23)
CO2: 20 mmol/L — ABNORMAL LOW (ref 22–32)
Calcium: 8.5 mg/dL — ABNORMAL LOW (ref 8.9–10.3)
Chloride: 98 mmol/L (ref 98–111)
Creatinine, Ser: 1.15 mg/dL (ref 0.61–1.24)
GFR, Estimated: >60 mL/min (ref >60)
Glucose, Bld: 248 mg/dL — ABNORMAL HIGH (ref 70–99)
Potassium: 4.1 mmol/L (ref 3.5–5.1)
Sodium: 134 mmol/L — ABNORMAL LOW (ref 135–145)
Total Bilirubin: 4 mg/dL — ABNORMAL HIGH (ref 0.3–1.2)
Total Protein: 8 g/dL (ref 6.5–8.1)

## 2023-06-09 LAB — CBC
HCT: 28.6 % — ABNORMAL LOW (ref 39.0–52.0)
HCT: 28.3 % — ABNORMAL LOW (ref 39.0–52.0)
Hemoglobin: 10.1 g/dL — ABNORMAL LOW (ref 13.0–17.0)
Hemoglobin: 9.8 g/dL — ABNORMAL LOW (ref 13.0–17.0)
MCH: 30.3 pg (ref 26.0–34.0)
MCH: 30.9 pg (ref 26.0–34.0)
MCHC: 35.3 g/dL (ref 30.0–36.0)
MCHC: 34.6 g/dL (ref 30.0–36.0)
MCV: 85.9 fL (ref 80.0–100.0)
MCV: 89.3 fL (ref 80.0–100.0)
Platelets: 30 10*3/uL — ABNORMAL LOW (ref 150–400)
Platelets: 24 10*3/uL — CL (ref 150–400)
RBC: 3.33 MIL/uL — ABNORMAL LOW (ref 4.22–5.81)
RBC: 3.17 MIL/uL — ABNORMAL LOW (ref 4.22–5.81)
RDW: 16.9 % — ABNORMAL HIGH (ref 11.5–15.5)
RDW: 17.3 % — ABNORMAL HIGH (ref 11.5–15.5)
WBC: 43.3 10*3/uL — ABNORMAL HIGH (ref 4.0–10.5)
WBC: 43.3 10*3/uL — ABNORMAL HIGH (ref 4.0–10.5)
nRBC: 1.1 % — ABNORMAL HIGH (ref 0.0–0.2)
nRBC: 1.4 % — ABNORMAL HIGH (ref 0.0–0.2)

## 2023-06-09 LAB — CG4 I-STAT (LACTIC ACID): Lactic Acid, Venous: 2.3 mmol/L (ref 0.5–1.9)

## 2023-06-09 LAB — APTT
aPTT: 46 s — ABNORMAL HIGH (ref 24–36)
aPTT: 46 s — ABNORMAL HIGH (ref 24–36)

## 2023-06-09 LAB — PROTIME-INR
INR: 1.6 — ABNORMAL HIGH (ref 0.8–1.2)
Prothrombin Time: 18.9 s — ABNORMAL HIGH (ref 11.4–15.2)

## 2023-06-09 LAB — FIBRINOGEN: Fibrinogen: 650 mg/dL — ABNORMAL HIGH (ref 210–475)

## 2023-06-09 LAB — MAGNESIUM: Magnesium: 2.6 mg/dL — ABNORMAL HIGH (ref 1.7–2.4)

## 2023-06-09 LAB — LACTATE DEHYDROGENASE: LDH: 1106 U/L — ABNORMAL HIGH (ref 98–192)

## 2023-06-09 LAB — PHOSPHORUS: Phosphorus: 3.1 mg/dL (ref 2.5–4.6)

## 2023-06-09 MED ORDER — METHYLPREDNISOLONE SODIUM SUCC 40 MG IJ SOLR
40.0000 mg | INTRAMUSCULAR | Status: DC
  Administered 2023-06-10 – 2023-06-11 (×2): 40 mg via INTRAVENOUS
  Filled 2023-06-09 (×2): qty 1

## 2023-06-09 MED ORDER — POTASSIUM CHLORIDE 10 MEQ/50ML IV SOLN
10.0000 meq | INTRAVENOUS | Status: AC
  Administered 2023-06-09 (×2): 10 meq via INTRAVENOUS
  Filled 2023-06-09 (×2): qty 50

## 2023-06-09 MED ORDER — SODIUM CHLORIDE 0.9 % IV BOLUS: 250.0000 mL | Freq: Once | INTRAVENOUS | Status: DC

## 2023-06-09 MED ORDER — ALBUMIN HUMAN 5 % IV SOLN
12.5000 g | Freq: Once | INTRAVENOUS | Status: AC
  Administered 2023-06-09: 12.5 g via INTRAVENOUS
  Filled 2023-06-09: qty 250

## 2023-06-09 MED ORDER — TRACE MINERALS CU-MN-SE-ZN 300-55-60-3000 MCG/ML IV SOLN
INTRAVENOUS | Status: AC
  Filled 2023-06-09: qty 936

## 2023-06-09 MED ORDER — METHYLPREDNISOLONE SODIUM SUCC 40 MG IJ SOLR: 20.0000 mg | INTRAMUSCULAR | Status: DC

## 2023-06-09 NOTE — Progress Notes (Signed)
Patient ID: Trent Blonski, male   DOB: 03/28/1961, 62 y.o.   MRN: [id number]   S:  Seen and examined on CRRT; procedure supervised.  He had 2.9 liters UF over 10/2 with CRRT.   Goal UF has been keep even as tolerated.  He continues on ECMO.  Lactic acid had increased yesterday but trending back down on most recent check.  Per nursing he has consistently been more interactive.  Per nursing his CVP has been 4-6.  Levo is at 3 mcg/min and vaso is at 0.03 units/min.  Spoke with RN and ECMO specialist.  Anuric - nursing is bladder scanning him per protocol.   Review of systems:  Unable to obtain secondary to intubated and obtunded     O:BP 123/66   Pulse 79   Temp 98.4 F (36.9 C) (Core)   Resp 19   Ht 5\' 6"  (1.676 m)   Wt 73.4 kg   SpO2 100%   BMI 26.12 kg/m   Intake/Output Summary (Last 24 hours) at 06/09/2023 0626 Last data filed at 06/09/2023 0600 Gross per 24 hour  Intake 3094.77 ml  Output 3042.8 ml  Net 51.97 ml   Intake/Output: I/O last 3 completed shifts: In: 4884.8 [I.V.:3401.3; Blood:903; IV Piggyback:580.6] Out: 5804 [Stool:60; Chest Tube:2]  Intake/Output this shift:  Total I/O In: 1448.1 [I.V.:1051.8; Blood:186; IV Piggyback:210.2] Out: 1682.6  Weight change: -2.7 kg  General adult male in bed critically ill       HEENT normocephalic atraumatic  Neck supple trachea midline Lungs coarse mechanical breath sounds  Heart  On ECMO; S1S2  Abdomen soft nontender with limitation of obtunded; nondistended Extremities no pitting edema; decreased skin turgor of feet  Neuro - no continuous sedation; he gently moved his right thumb when examiner asked him to "show me your thumb" GU no foley  Access right subclavian tunneled dialysis catheter in place    Recent Labs  Lab 06/03/23 0405 06/03/23 0410 06/04/23 0413 06/04/23 0843 06/05/23 0346 06/05/23 0347 06/06/23 0415 06/06/23 4098 06/06/23 1617 06/06/23 1627 06/07/23 0354 06/07/23 0727 06/07/23 1635  06/07/23 1824 06/08/23 0424 06/08/23 0432 06/08/23 0739 06/08/23 0953 06/08/23 1638 06/08/23 1952 06/09/23 0355 06/09/23 0402  NA 133*   < > 135  136   < > 135  135   < > 134*   < > 133*   < > 134*   < > 133*   < > 133* 136 136 136 134*  136 136 134* 137  K 4.6   < > 4.4  4.3   < > 4.2  4.2   < > 4.1   < > 4.1   < > 4.1   < > 4.6   < > 4.6 4.3 4.6 3.9 4.0  4.1 4.1 4.1 3.5  CL 97*   < > 101   < > 99  98   < > 99  --  101  --  98  --  98  --  100  --   --   --  100  --  98  --   CO2 27   < > 23   < > 22  23   < > 22  --  20*  --  21*  --  21*  --  22  --   --   --  21*  --  20*  --   GLUCOSE 230*   < > 196*   < > 173*  171*   < >  200*  --  191*  --  176*  --  296*  --  231*  --   --   --  259*  --  248*  --   BUN 24*   < > 29*   < > 34*  34*   < > 37*  --  42*  --  37*  --  40*  --  45*  --   --   --  48*  --  51*  --   CREATININE 0.93   < > 1.06   < > 1.13  1.11   < > 1.23  --  1.27*  --  1.11  --  1.18  --  1.16  --   --   --  1.18  --  1.15  --   ALBUMIN 1.9*   < > 2.0*  2.0*   < > 2.0*  2.0*   < > 2.0*  --  2.0*  --  2.0*  2.0*  --  2.1*  --  2.3*  2.2*  --   --   --  2.2*  --  2.2*  --   CALCIUM 8.1*   < > 8.2*   < > 8.6*  8.6*   < > 8.7*  --  8.2*  --  8.5*  --  8.3*  --  8.6*  --   --   --  8.6*  --  8.5*  --   PHOS 3.2   < > 3.4   < > 3.1   < > 3.3  --  3.4  --  3.5  --  4.6  --  3.9  --   --   --  4.0  --  3.1  --   AST 111*  --  108*  --  116*  --  126*  --   --   --  109*  --   --   --  98*  --   --   --   --   --  100*  --   ALT 13  --  16  --  17  --  17  --   --   --  14  --   --   --  10  --   --   --   --   --  12  --    < > = values in this interval not displayed.   Liver Function Tests: Recent Labs  Lab 06/07/23 0354 06/07/23 1635 06/08/23 0424 06/08/23 1638 06/09/23 0355  AST 109*  --  98*  --  100*  ALT 14  --  10  --  12  ALKPHOS 131*  --  135*  --  129*  BILITOT 4.0*  --  4.1*  --  4.0*  PROT 8.0  --  8.5*  --  8.0  ALBUMIN 2.0*  2.0*   < >  2.3*  2.2* 2.2* 2.2*   < > = values in this interval not displayed.   No results for input(s): "LIPASE", "AMYLASE" in the last 168 hours. Recent Labs  Lab 06/03/23 0742 06/04/23 1229  AMMONIA 26 40*   CBC: Recent Labs  Lab 06/07/23 1635 06/07/23 1824 06/08/23 0424 06/08/23 0432 06/08/23 1137 06/08/23 1638 06/08/23 1952 06/09/23 0355 06/09/23 0402  WBC 50.0*  --  53.0*  --  51.1* 54.4*  --  43.3*  --   HGB 7.9*   < > 7.7*   < >  8.0* 7.9*  9.2* 10.5* 10.1* 10.5*  HCT 22.7*   < > 23.3*   < > 23.9* 23.1*  27.0* 31.0* 28.6* 31.0*  MCV 91.2  --  90.0  --  92.3 90.6  --  85.9  --   PLT 86*  --  66*  --  56* 47*  --  30*  --    < > = values in this interval not displayed.   Cardiac Enzymes: No results for input(s): "CKTOTAL", "CKMB", "CKMBINDEX", "TROPONINI" in the last 168 hours. CBG: Recent Labs  Lab 06/08/23 1137 06/08/23 1648 06/08/23 1950 06/08/23 2350 06/09/23 0400  GLUCAP 247* 249* 302* 288* 243*    Iron Studies: No results for input(s): "IRON", "TIBC", "TRANSFERRIN", "FERRITIN" in the last 72 hours. Studies/Results: DG CHEST PORT 1 VIEW  Result Date: 06/08/2023 CLINICAL DATA:  Follow-up ECMO EXAM: PORTABLE CHEST 1 VIEW COMPARISON:  06/07/2023 FINDINGS: Tracheostomy remains in place. Nasogastric tube enters the stomach. Bilateral chest tubes remain in place. Right internal jugular central line tip in the SVC above the right atrium. ECMO device appears the same as seen yesterday. Left subclavian central line tip at the innominate SVC junction. Widespread bilateral pulmonary opacity persists, denser on the right than the left. No new finding. IMPRESSION: No change since yesterday. Widespread bilateral pulmonary opacity, denser on the right than the left. ECMO device and other lines and tubes appears the same as seen yesterday. Electronically Signed   By: Paulina Fusi M.D.   On: 06/08/2023 10:18   DG Abd 1 View  Result Date: 06/07/2023 CLINICAL DATA:  Nasogastric tube  placement. EXAM: ABDOMEN - 1 VIEW COMPARISON:  May 31, 2023. FINDINGS: Nasogastric tube tip is seen in expected position of proximal stomach. IMPRESSION: Nasogastric tube tip seen in expected position of proximal stomach. Electronically Signed   By: Lupita Raider M.D.   On: 06/07/2023 15:57   DG Chest Port 1 View  Result Date: 06/07/2023 CLINICAL DATA:  Tracheostomy status. EXAM: PORTABLE CHEST 1 VIEW COMPARISON:  Same day. FINDINGS: Tracheostomy is in grossly good position. Bilateral chest tubes are noted without pneumothorax. Nasogastric tube is seen entering stomach. Stable cardiomediastinal silhouette. ECMO device is noted. Bilateral lung opacities are again noted, right greater than left. IMPRESSION: Tracheostomy tube in grossly good position. Otherwise stable findings. Electronically Signed   By: Lupita Raider M.D.   On: 06/07/2023 15:56   DG CHEST PORT 1 VIEW  Result Date: 06/07/2023 CLINICAL DATA:  ECMO. EXAM: PORTABLE CHEST 1 VIEW COMPARISON:  Radiograph yesterday FINDINGS: Stable support apparatus including endotracheal tube, enteric tube, right-sided dialysis catheter, left central line, right-sided ECMO catheter, bilateral chest tubes. Heterogeneous bilateral lung opacities with equivocal improvement. Suspected small pleural effusions. No visible pneumothorax. IMPRESSION: 1. Stable support apparatus. 2. Heterogeneous bilateral lung opacities with equivocal improvement. Suspected small pleural effusions. Electronically Signed   By: Narda Rutherford M.D.   On: 06/07/2023 10:38    sodium chloride   Intravenous Once   sodium chloride   Intravenous Once   artificial tears   Both Eyes Q8H   bisacodyl  10 mg Rectal Daily   Chlorhexidine Gluconate Cloth  6 each Topical Daily   darbepoetin (ARANESP) injection - DIALYSIS  40 mcg Subcutaneous Q Wed-1800   insulin aspart  0-20 Units Subcutaneous Q4H   insulin aspart  5 Units Subcutaneous Q4H   insulin detemir  25 Units Subcutaneous BID    methylPREDNISolone (SOLU-MEDROL) injection  120 mg Intravenous Q24H   mouth  rinse  15 mL Mouth Rinse Q2H   pantoprazole (PROTONIX) IV  40 mg Intravenous QHS   sodium chloride flush  3 mL Intravenous Q12H    BMET    Component Value Date/Time   NA 137 06/09/2023 0402   NA 143 11/02/2022 1454   K 3.5 06/09/2023 0402   CL 98 06/09/2023 0355   CO2 20 (L) 06/09/2023 0355   GLUCOSE 248 (H) 06/09/2023 0355   BUN 51 (H) 06/09/2023 0355   BUN 31 (H) 11/02/2022 1454   CREATININE 1.15 06/09/2023 0355   CREATININE 2.71 (H) 06/01/2023 0950   CREATININE 1.05 02/24/2015 1601   CALCIUM 8.5 (L) 06/09/2023 0355   GFRNONAA >60 06/09/2023 0355   GFRNONAA 26 (L) 05/22/2023 0950   GFRNONAA 80 02/24/2015 1601   GFRAA 62 10/09/2020 1520   GFRAA >89 02/24/2015 1601   CBC    Component Value Date/Time   WBC 43.3 (H) 06/09/2023 0355   RBC 3.33 (L) 06/09/2023 0355   HGB 10.5 (L) 06/09/2023 0402   HGB 6.4 (LL) 05/19/2023 0852   HGB 10.2 (L) 08/12/2022 0934   HCT 31.0 (L) 06/09/2023 0402   HCT 29.2 (L) 08/12/2022 0934   PLT 30 (L) 06/09/2023 0355   PLT 109 (L) 05/25/2023 0852   PLT 252 08/12/2022 0934   MCV 85.9 06/09/2023 0355   MCV 94 08/12/2022 0934   MCH 30.3 06/09/2023 0355   MCHC 35.3 06/09/2023 0355   RDW 16.9 (H) 06/09/2023 0355   RDW 15.5 (H) 08/12/2022 0934   LYMPHSABS 5.8 (H) 05/29/2023 1722   LYMPHSABS 1.9 11/04/2021 1028   MONOABS 6.3 (H) 05/29/2023 1722   EOSABS 0.0 05/29/2023 1722   EOSABS 0.2 11/04/2021 1028   BASOSABS 0.0 05/29/2023 1722   BASOSABS 0.1 11/04/2021 1028    Assessment/Plan:   Anuric AKI/dialysis dependent AKI with fluid overload: Started on RRT during previous admission in May 2024 until 05/10/23 (recovered). Patient is status post cardiac surgery on May 31, 2023 and currently on ECMO.  CRRT start 9/11. No signs of renal recovery.   Given his overall picture, will give NS 250 mL bolus once now and may need to repeat later today.  Do not remove bolus with CRRT -  discussed with RN and specified on order UF is keep even as tolerated.  Please page nephrology for UF goal changes    All 4K fluids  On angiomax per team for anticoagulation.  Severe prosthetic AI due to previous MSSA of endocarditis with partial valve dehiscence, status post redo AVR and TV repair on 05/27/2023.  Per CTS Shock: multifactorial including cardiogenic and septic, on pressors and ECMO per primary team. Right lung consolidation. Profound leukocytosis which is slightly improved; followed by ID, heart failure team   Candida albicans pneumonia - antifungals per ID  Acute hypoxic respiratory failure: on vent and s/p trach on 10/1; per primary service. ECMO. Fungal PNA. Abx/antifungal per primary service and ID   Anemia, thrombocytopenia: Transfuse as needed per primary team.  Started aranesp 40 mcg every Wednesday on 10/2 Hypophosphatemia - repletion prn  Disposition - in ICU on CRRT, ECMO.  Critically ill    Estanislado Emms, MD 6:46 AM 06/09/2023

## 2023-06-09 NOTE — Inpatient Diabetes Management (Signed)
Inpatient Diabetes Program Recommendations  AACE/ADA: New Consensus Statement on Inpatient Glycemic Control (2015)  Target Ranges:  Prepandial:   less than 140 mg/dL      Peak postprandial:   less than 180 mg/dL (1-2 hours)      Critically ill patients:  140 - 180 mg/dL   Lab Results  Component Value Date   GLUCAP 229 (H) 06/09/2023   HGBA1C 6.1 (H) 06/01/2023    Review of Glycemic Control  Latest Reference Range & Units 06/08/23 23:50 06/09/23 04:00 06/09/23 07:23  Glucose-Capillary 70 - 99 mg/dL 161 (H) 096 (H) 045 (H)  (H): Data is abnormally high Diabetes history: Type 2 DM Outpatient Diabetes medications: Farxiga 10 mg QD Current orders for Inpatient glycemic control: Levemir 25 units BID, Novolog 5 units Q4H, Novolog 0-20 units Q4H Solumedrol 120 mg x 1 tapered to  Solumedrol 40 mg every day TPN no insulin added  Inpatient Diabetes Program Recommendations:    Consider increasing tube feed coverage to Novolog 8 units Q4H (to be stopped or held in the event TPN is held).   Thanks, Lujean Rave, MSN, RNC-OB Diabetes Coordinator [phone] (8a-5p)

## 2023-06-09 NOTE — Progress Notes (Addendum)
PHARMACY - TOTAL PARENTERAL NUTRITION CONSULT NOTE   Indication:  unable to tolerate tube feeding  Patient Measurements: Height: 5\' 6"  (167.6 cm) Weight: 73.4 kg (161 lb 13.1 oz) IBW/kg (Calculated) : 63.8 TPN AdjBW (KG): 69 Body mass index is 26.12 kg/m. Usual Weight: ~90kg  Assessment: 62 yo male with significant PMH for HLD, HTN, T2DM, CKD4 (prev on HD) and recent MSSA bacteremia + endocarditis (May 2024). Back in May 2024 admission, underwent ERCP for cholangitis/pancreatitis. Admitted on 9/11 w/ chest pain found to have aortic valve dehiscence. S/p aortic valve replacement redo on 9/13 - developed pulmonary edema post op and was placed on [place] ECMO, transitioned to VV ECMO 9/24. S/p mediastinal washout on 9/19 - chest still open. Unable to speak w/ patient due to intubation (utilizing Precedex for sedation). Pharmacy consulted to manage TPN for inability to tolerate tube feeding and malnutrition.   Glucose / Insulin: hx T2DM - A1c 6.1%, on Farxiga PTA. BG [id number]. Used 88 units rSSI in 24 hours. Received 60 units Detemir > scheduled 25 units BID  -10/1 started steroid course, plan for 2 week taper starting Jun 15, 2023 Electrolytes: K 3.4 (none in TPN), Mg 2.6 trending up (none in TPN), CoCa 9.9/iCa 1.18, others wnl Renal: Hx CKD4 on CRRT since 9/11 (required HD in the past), 4K bags, systemic bivalirudin for anticoag - not using systemic sodium citrate infusion, only for catheter lock per d/w RN at bedside on 9/24; BUN 51 Hepatic: Alk phos 129, AST 100, ALT wnl, Tbili 4, albumin 2.2, TG 188  Intake / Output:  Stool 30 mL, Chest tube 0 mL, CRRT removed 3014 mL (keeping even) GI Imaging:  9/27 CT A/P : mild wall thickening in several small bowel loops suggesting enteritis, cholelithiasis GI Surgeries / Procedures:  9/24 transition to VV ECMO 9/20 Cortrak placed  9/22 NGT placed  Central access: 9/13 TPN start date: 9/23  Nutritional Goals: Goal concentrated TPN rate is 75 mL/hr (provides  140 g of protein and 2207 kcals per day)  RD Assessment: Estimated Needs Total Energy Estimated Needs: 2200-2400 Total Protein Estimated Needs: 130-150 grams Total Fluid Estimated Needs: 1L plus UOP  Current Nutrition:  NPO and TPN  Plan:  Continue concentrated TPN at 75 mL/hr at 1800 - meeting 100% of needs Electrolytes in TPN: Na 125 mEq/L, K 0 mEq/L, Ca 0 mEq/L, Mg 0 mEq/L, and Phos 20 mmol/L. Change Cl:Acet to max acet  Give Kcl 20 mEq IV  Standard MVI and trace elements within TPN No chromium (on CRRT and currently on shortage) Continue Resistant q4h SSI + aspart 5 units q4h  Stop insulin detemir and add 50 units insulin regular to TPN. Will adjust considering steroid taper  Continue M[facility]F at [facility] Monitor TPN labs on Mon/Thurs, RFP BID while on CRRT   F/u TF restart   Alphia Moh, PharmD, BCPS, [facility] Clinical Pharmacist  Please check AMION for all [facility] Pharmacy phone numbers After 10:00 PM, call Main Pharmacy [phone]

## 2023-06-09 NOTE — Progress Notes (Signed)
Vent changes made by Dr. Katrinka Blazing, CCM.

## 2023-06-09 NOTE — Progress Notes (Signed)
ANTICOAGULATION CONSULT NOTE - Follow up Consult  Pharmacy Consult for bivalirudin Indication:  VV ECMO  Allergies  Allergen Reactions   Other Anaphylaxis    Mushrooms  Not listed on MAR    Plavix [Clopidogrel Bisulfate] Other (See Comments)    TTP Not listed on the [facility]   Fleet Enema [Enema] Other (See Comments)    Unknown reaction   Lovenox [Enoxaparin] Other (See Comments)    Unknown reaction   Morphine Other (See Comments)    Unknown reaction   Nsaids Other (See Comments)    Unknown reaction   Hydrocodone-Acetaminophen Itching    Patient Measurements: Height: 5\' 6"  (167.6 cm) Weight: 73.4 kg (161 lb 13.1 oz) IBW/kg (Calculated) : 63.8 Heparin Dosing Weight: 81.3 kg  Vital Signs: Temp: 98.2 F (36.8 C) (10/03 1600) Temp Source: Core (10/03 1600) BP: 105/65 (10/03 1650) Pulse Rate: 79 (10/03 1650)  Labs: Recent Labs    06/07/23 0354 06/07/23 0727 06/08/23 0424 06/08/23 0432 06/08/23 1638 06/08/23 1952 06/09/23 0355 06/09/23 0402 06/09/23 0934 06/09/23 1536 06/09/23 1654  HGB 8.0*   < > 7.7*   < > 7.9*  9.2*   < > 10.1*   < > 10.9* 10.2* 9.8*  HCT 23.9*   < > 23.3*   < > 23.1*  27.0*   < > 28.6*   < > 32.0* 30.0* 28.3*  PLT 30*   < > 66*   < > 47*  --  30*  --   --   --  24*  APTT 46*   < > 47*  --  50*  --  46*  --   --   --  46*  LABPROT 18.2*  --  18.5*  --   --   --  18.9*  --   --   --   --   INR 1.5*  --  1.5*  --   --   --  1.6*  --   --   --   --   CREATININE 1.11   < > 1.16  --  1.18  --  1.15  --   --   --  1.12   < > = values in this interval not displayed.    Estimated Creatinine Clearance: 60.9 mL/min (by C-G formula based on SCr of 1.12 mg/dL).   Assessment: 62 yom underwent Bentall/AVR, TV repair, re-implantation of SVG-RCA complicated with vasoplegia and pulmonary edema requiring central VA ECMO. On 9/24 patient underwent transition VA to VV ECMO.   aPTT is slightly subtherapeutic at 46, on bivalirudin@0 .0.14 mg/kg/hr. Hgb 10.1, plt  30. LDH up slightly to 1106. Fibrinogen up slightly to 640. Small amount of fibrin - stable.  10/3 PM Update: aPTT 46 (subtherapeutic), Hgb 9.8, Plts 24k - per d/w Dr. Gasper Lloyd, do not increase bivalirudin rate at this time due to decreasing platelet count.    Goal of Therapy:  aPTT 50-70 seconds Monitor platelets by anticoagulation protocol: Yes   Plan:  Continue bivalirudin at 0.016 mg/kg/hr  Monitor q12 hr aPTT and CBC, LDH, fibrinogen and for s/sx of bleeding.  Thank you for allowing pharmacy to participate in this patient's care,  Loralee Pacas, PharmD, BCPS 06/09/2023 5:52 PM  Please check AMION for all [facility] Pharmacy phone numbers After 10:00 PM, call Main Pharmacy [phone]

## 2023-06-09 NOTE — Progress Notes (Signed)
[facility]    Date of Admission:  06/02/2023     ID: [name] is a 62 y.o. male with  Principal Problem:   Cardiogenic shock (HCC) Active Problems:   CAD (coronary artery disease)   Type 2 diabetes mellitus without complication, without long-term current use of insulin (HCC)   OSA on CPAP   Symptomatic anemia   Acute on chronic congestive heart failure (HCC)   Prosthetic aortic valve failure   Acute respiratory failure with hypoxia (HCC)   Anemia   Protein-calorie malnutrition, severe    Subjective: Afebrile, pressor requirement decreased today  Wbc trending downward on morning labs  Medications:   sodium chloride   Intravenous Once   sodium chloride   Intravenous Once   artificial tears   Both Eyes Q8H   bisacodyl  10 mg Rectal Daily   Chlorhexidine Gluconate Cloth  6 each Topical Daily   darbepoetin (ARANESP) injection - DIALYSIS  40 mcg Subcutaneous Q Wed-1800   insulin aspart  0-20 Units Subcutaneous Q4H   insulin aspart  5 Units Subcutaneous Q4H   [START ON 06/13/2023] methylPREDNISolone (SOLU-MEDROL) injection  40 mg Intravenous Q24H   Followed by   [name] ON 06/17/2023] methylPREDNISolone (SOLU-MEDROL) injection  20 mg Intravenous Q24H   mouth rinse  15 mL Mouth Rinse Q2H   pantoprazole (PROTONIX) IV  40 mg Intravenous QHS   sodium chloride flush  3 mL Intravenous Q12H    Objective: Vital signs in last 24 hours: Temp:  [98.2 F (36.8 C)-98.4 F (36.9 C)] 98.2 F (36.8 C) (10/03 1200) Pulse Rate:  [63-79] 73 (10/03 1200) Resp:  [0-33] 25 (10/03 1200) BP: (119-133)/(59-73) 133/73 (10/03 0857) SpO2:  [97 %-100 %] 100 % (10/03 1200) Arterial Line BP: (55-151)/(45-76) 149/72 (10/03 1200) FiO2 (%):  [50 %] 50 % (10/03 1118) Weight:  [73.4 kg] 73.4 kg (10/03 0458) Physical Exam  Constitutional: He is oriented to person, place He appears chronically ill. No distress.  HENT: trach in place Mouth/Throat: Oropharynx is clear and  moist. No oropharyngeal exudate.  Cardiovascular: Normal rate, regular rhythm and normal heart sounds. Exam reveals no gallop and no friction rub.  No murmur heard.  Pulmonary/Chest: Effort normal and breath sounds normal. No respiratory distress. He has no wheezes. Mediastinal tubes in place Abdominal: Soft. Bowel sounds are normal. He exhibits no distension. There is no tenderness. Ecmo cannulation site Ext: dry gangrenous changes to toes bilaterally Skin: Skin is warm and dry. No rash noted. No erythema.    Lab Results Recent Labs    06/08/23 1638 06/08/23 1952 06/09/23 0355 06/09/23 0402 06/09/23 0814 06/09/23 0934  WBC 54.4*  --  43.3*  --   --   --   HGB 7.9*  9.2*   < > 10.1*   < > 10.5* 10.9*  HCT 23.1*  27.0*   < > 28.6*   < > 31.0* 32.0*  NA 134*  136   < > 134*   < > 137 137  K 4.0  4.1   < > 4.1   < > 3.3* 3.4*  CL 100  --  98  --   --   --   CO2 21*  --  20*  --   --   --   BUN 48*  --  51*  --   --   --   CREATININE 1.18  --  1.15  --   --   --    < > =  values in this interval not displayed.   Liver Panel Recent Labs    06/07/23 0354 06/07/23 1635 06/08/23 0424 06/08/23 1638 06/09/23 0355  PROT 8.0  --  8.5*  --  8.0  ALBUMIN 2.0*  2.0*   < > 2.3*  2.2* 2.2* 2.2*  AST 109*  --  98*  --  100*  ALT 14  --  10  --  12  ALKPHOS 131*  --  135*  --  129*  BILITOT 4.0*  --  4.1*  --  4.0*  BILIDIR 2.4*  --  2.2*  --   --   IBILI 1.6*  --  1.9*  --   --    < > = values in this interval not displayed.   Sedimentation Rate Recent Labs    06/07/23 0800  ESRSEDRATE >140*    Microbiology: reviewed Studies/Results: DG CHEST PORT 1 VIEW  Result Date: 06/09/2023 CLINICAL DATA:  ECMO. EXAM: PORTABLE CHEST 1 VIEW COMPARISON:  June 08, 2023. FINDINGS: Tracheostomy tube is unchanged. Nasogastric tube is seen entering stomach. Right internal jugular catheter is unchanged. Bilateral chest tubes are noted without pneumothorax. ECMO device is noted. Stable  bilateral lung opacities are noted. IMPRESSION: Stable support apparatus.  Stable bilateral opacities. Electronically Signed   By: Lupita Raider M.D.   On: 06/09/2023 10:32   DG CHEST PORT 1 VIEW  Result Date: 06/08/2023 CLINICAL DATA:  Follow-up ECMO EXAM: PORTABLE CHEST 1 VIEW COMPARISON:  06/07/2023 FINDINGS: Tracheostomy remains in place. Nasogastric tube enters the stomach. Bilateral chest tubes remain in place. Right internal jugular central line tip in the SVC above the right atrium. ECMO device appears the same as seen yesterday. Left subclavian central line tip at the innominate SVC junction. Widespread bilateral pulmonary opacity persists, denser on the right than the left. No new finding. IMPRESSION: No change since yesterday. Widespread bilateral pulmonary opacity, denser on the right than the left. ECMO device and other lines and tubes appears the same as seen yesterday. Electronically Signed   By: Paulina Fusi M.D.   On: 06/08/2023 10:18   DG Abd 1 View  Result Date: 06/07/2023 CLINICAL DATA:  Nasogastric tube placement. EXAM: ABDOMEN - 1 VIEW COMPARISON:  May 31, 2023. FINDINGS: Nasogastric tube tip is seen in expected position of proximal stomach. IMPRESSION: Nasogastric tube tip seen in expected position of proximal stomach. Electronically Signed   By: Lupita Raider M.D.   On: 06/07/2023 15:57   DG Chest Port 1 View  Result Date: 06/07/2023 CLINICAL DATA:  Tracheostomy status. EXAM: PORTABLE CHEST 1 VIEW COMPARISON:  Same day. FINDINGS: Tracheostomy is in grossly good position. Bilateral chest tubes are noted without pneumothorax. Nasogastric tube is seen entering stomach. Stable cardiomediastinal silhouette. ECMO device is noted. Bilateral lung opacities are again noted, right greater than left. IMPRESSION: Tracheostomy tube in grossly good position. Otherwise stable findings. Electronically Signed   By: Lupita Raider M.D.   On: 06/07/2023 15:56     Assessment/Plan: Hx  of MSSA PVe now with AVR/bentall and TV repair =continue on cefazolin for the time being  Respiratory failure on trach/vent with pneumonia = thought to be candidal pneumonia +/- aspergillus terreus. Continue on micafungin. Contineus on trial of steroids  Ischemic/dry gangrene of feet bilaterally = continue to monitor to see if any transition that would be cellulitic,soft tissue infection. For now appears stable  Thromobcytopenia = continue to monitor for acute bleed should it decrease further.  Judyann Munson  [facility] Pager: [phone]  06/09/2023, 12:47 PM

## 2023-06-09 NOTE — Progress Notes (Signed)
Nutrition Follow-up  DOCUMENTATION CODES:   Severe malnutrition in context of chronic illness  INTERVENTION:   - TPN to meet 100% of nutritional needs  - When able to trial trickle tube feeds again, recommend considering replacing Cortrak and attempting for post-pyloric position. Recommend Pivot 1.5 @ 20 ml/hr when able to retry trickle tube feeds.  NUTRITION DIAGNOSIS:   Severe Malnutrition related to chronic illness as evidenced by severe muscle depletion, moderate fat depletion, edema, severe fat depletion, moderate muscle depletion.  Ongoing, being addressed via nutrition support  GOAL:   Patient will meet greater than or equal to 90% of their needs  Met via nutrition support  MONITOR:   Vent status, Labs, Weight trends, TF tolerance, Skin, I & O's  REASON FOR ASSESSMENT:   Consult New TPN/TNA  ASSESSMENT:   62 year old male with PMHx of CAD s/p CABG 2017 with AVR, HLD, HTN, prior CVA, T2DM, CKD 4 was previously on HD. Recent admission on 5/25 with AKI and AMS, had PEA arrest with ROSC in 8 minutes, required dialysis, also with MSSA bacteremia associated with tricuspid valve endocarditis, suffered right cerebellar CVA and had cholangitis/pancreatitis requiring ERCP. 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  09/10 - admitted 09/11 - echo showing aortic valve dehiscence, confirmed by TEE, CRRT started for volume overload and known CKD 4 09/13 - OR for redo of aortic valve, tricuspid valve repair and ascending aortic root replacement with re-implantation of SVG to RCA, could not come off CPB, cannulated and started on VA ECMO with central cannulation 09/16 - TEE with EF 30%, moderate LVH, moderate RV dysfunction with mild enlargement, no endocarditis 09/18 - bronch this AM, TF held due to bilious output from mouth reported 09/19 - mediastinal washout, OG decompression, TF remains on hold 09/20 - bronch with copious secretions in  R, mild irritation, abdomen soft, +stool, Cortrak placed, trickle TF initiated 09/21 - chest x-ray worse: dense consolidation RL, bronch performed with ongoing heavy mucopurulent secretions on right, remains on trickle TF, no BM 09/22 - TF at trickles with 1 L stool but later held and OG to suction with 2 L removed 09/23 - TPN initiated 09/24 - OR for transition from [place] to VV ECMO 10/01 - trach placed 10/02 - lactic acid and pressor needs increasing  Pt remains on vent support via trach, VV ECMO, CRRT. Vasopressin at 0.03, levophed at 2, off epinephrine.  TPN is being concentrated and infusing at goal rate of 75 ml/hr which provides 2207 kcal and 140 grams of protein; pt receiving standard MVI and trace elements except for chromium. NG tube remains in place to [facility] with brown/green output. Minimal output from NG tube in cannister in room at time of RD visit. No NG tube output documented in I/O's.  Per discussion with MD, no plans to resume trickle tube feeds today. Continuing TPN and waiting for return of bowel function. MD questions intermittent intestinal ischemia; however, no plans for further CT scans unless it is to help with GOC discussions.  Current weight: 73.4 kg Admit weight 89.5 kg  Patient remains on ventilator support via trach. Temp (24hrs), Avg:98.3 F (36.8 C), Min:98.2 F (36.8 C), Max:98.4 F (36.9 C)  Drips: Angiomax: 0.016 mg/kg/hr Levophed: 2 mcg/min Vasopressin: 0.03 units/min TPN: 75 ml/hr  Medications reviewed and include: aranesp weekly, SSI every 4 hours, novolog 5 units every 4 hours, levemir 25 units BID, IV solu-medrol, IV protonix, IV abx, IV micafungin,  IV KCl 10 mEq x 2  Labs reviewed: potassium 3.4, magnesium 2.6, lactic acid 2.3 (now trending down), WBC 43.3, platelets 30 CBG's: 229-302 x 24 hours  FMS: 30 ml x 24 hours CRRT UF: 3014 ml x 24 hours I/O's: -14.4 L since admit  Diet Order:   Diet Order             Diet NPO time specified  Diet  effective now                   EDUCATION NEEDS:   No education needs have been identified at this time  Skin:  Skin Assessment: Skin Integrity Issues: Diabetic Ulcer: L great toe Incisions: closed incision LLE, chest Other: multiple skin tears  Last BM:  06/08/23 small amounts of stool via FMS  Height:   Ht Readings from Last 1 Encounters:  05/30/23 5\' 6"  (1.676 m)    Weight:   Wt Readings from Last 1 Encounters:  06/09/23 73.4 kg    Ideal Body Weight:  64.5 kg  BMI:  Body mass index is 26.12 kg/m.  Estimated Nutritional Needs:   Kcal:  2200-2400  Protein:  130-150 grams  Fluid:  1L plus UOP    Mertie Clause, MS, RD, LDN Registered Dietitian II Please see AMiON for contact information.

## 2023-06-09 NOTE — Progress Notes (Signed)
Patient ID: Rasool Rommel, male   DOB: 06-12-1961, 62 y.o.   MRN: [id number]   Extracorporeal support note   ECLS support day: 9 (V-V) (Day 21 of ECLS support) Indication: Acute hypoxic respiratory failure  Configuration: Crescent VV ECMO  30FR Dual-lumen Crescent cannula in RIJ   Pump speed: 3400rpm  Pump flow: 3.6 L/min Pump used: Cardiohelp  Sweep gas: 6  ABG: 7.4/36/235 LDH: 1100  Circuit check: Small fibrinous clot at 12p Anticoagulant: On bival Anticoagulation targets:  PTT 50-60  Changes in support: none  Anticipated goals/duration of support: Wean VV ECMO as able; continued improvement in CXR.   Marland KitchenAditya Diego Delancey DO  06/09/2023, 8:05 AM

## 2023-06-09 NOTE — Progress Notes (Signed)
Patient ID: [name], male   DOB: February 13, 1961, 62 y.o.   MRN: [id number]     Advanced Heart Failure Rounding Note  PCP-Cardiologist: Kristeen Miss, MD   Subjective:    06-09-2023: OR for Bentall/AVR, TV repair, re-implantation of SVG-RCA.  Post-op vasoplegia and pulmonary edema, required central VA ECMO (RA drainage, ascending aorta return.  Post-op TEE EF 55%, mild-moderate RV hypokinesis.  9/14: Blender added to circuit due to high PaO2 9/16: TEE with EF 30%, moderate LVH, moderate RV dysfunction with mild enlargement, stable bioprosthetic aortic valve, repaired TV with mild TR, no endocarditis noted.  9/18: Bronchoscopy with mucus plugs and clots. 9/19: To OR for mediastinal washout.  By TEE, EF 50% with moderate LVH, mild RV enlargement and mild RV dysfunction, stable bioprosthetic aortic valve, repaired TV with mild TR.  He did not tolerate lowering of ECMO flows even with increasing of pressors due to vasoplegia.  9/21: Bronchoscopy  BAL: growing candida and mold 9/24: Decannulated from [facility] ECMO. Chest closed. Transitioned to VV ECMO 10/1: Trach  Lactic acid clearing.   Pressor requirement down. On 2 Epi + 0.03 Vaso.   Running even on CRRT. CVP 4-6.   Objective:   Weight Range: 73.4 kg Body mass index is 26.12 kg/m.   Vital Signs:   Temp:  [98.1 F (36.7 C)-98.4 F (36.9 C)] 98.4 F (36.9 C) (10/03 0800) Pulse Rate:  [63-79] 70 (10/03 0900) Resp:  [0-33] 22 (10/03 0900) BP: (106-133)/(59-73) 133/73 (10/03 0857) SpO2:  [97 %-100 %] 100 % (10/03 0900) Arterial Line BP: (55-151)/(45-76) 144/76 (10/03 0900) FiO2 (%):  [50 %] 50 % (10/03 0857) Weight:  [73.4 kg] 73.4 kg (10/03 0458) Last BM Date : 06/08/23  Weight change: Filed Weights   06/07/23 0500 06/08/23 0500 06/09/23 0458  Weight: 76.2 kg 76.1 kg 73.4 kg    Intake/Output:   Intake/Output Summary (Last 24 hours) at 06/09/2023 0959 Last data filed at 06/09/2023 0900 Gross per 24 hour  Intake 3077.23 ml  Output  3033.4 ml  Net 43.83 ml      Physical Exam    General:  Critically ill appearing HEENT: + trach Neck: + VV ECMO cannula, L internal jugular HD cath Cor: PMI nondisplaced. Regular rate & rhythm. No rubs, gallops or murmurs. Lungs: coarse Abdomen: soft, nontender, nondistended.  Extremities: no cyanosis, clubbing, rash, edema Neuro: Awake. Follows commands.   Telemetry   Junctional rhythm 70s w/ PVCs  Labs    CBC Recent Labs    06/08/23 1638 06/08/23 1952 06/09/23 0355 06/09/23 0402 06/09/23 0814 06/09/23 0934  WBC 54.4*  --  43.3*  --   --   --   HGB 7.9*  9.2*   < > 10.1*   < > 10.5* 10.9*  HCT 23.1*  27.0*   < > 28.6*   < > 31.0* 32.0*  MCV 90.6  --  85.9  --   --   --   PLT 47*  --  30*  --   --   --    < > = values in this interval not displayed.   Basic Metabolic Panel Recent Labs    75/64/33 0354 06/07/23 0727 06/08/23 1638 06/08/23 1952 06/09/23 0355 06/09/23 0402 06/09/23 0814 06/09/23 0934  NA 134*   < > 134*  136   < > 134*   < > 137 137  K 4.1   < > 4.0  4.1   < > 4.1   < > 3.3*  3.4*  CL 98   < > 100  --  98  --   --   --   CO2 21*   < > 21*  --  20*  --   --   --   GLUCOSE 176*   < > 259*  --  248*  --   --   --   BUN 37*   < > 48*  --  51*  --   --   --   CREATININE 1.11   < > 1.18  --  1.15  --   --   --   CALCIUM 8.5*   < > 8.6*  --  8.5*  --   --   --   MG 2.4  --   --   --  2.6*  --   --   --   PHOS 3.5   < > 4.0  --  3.1  --   --   --    < > = values in this interval not displayed.   Liver Function Tests Recent Labs    06/08/23 0424 06/08/23 1638 06/09/23 0355  AST 98*  --  100*  ALT 10  --  12  ALKPHOS 135*  --  129*  BILITOT 4.1*  --  4.0*  PROT 8.5*  --  8.0  ALBUMIN 2.3*  2.2* 2.2* 2.2*   BNP: BNP (last 3 results) Recent Labs    05/23/2023 1100  BNP 2,097.0*     Imaging    No results found.   Medications:     Scheduled Medications:  sodium chloride   Intravenous Once   sodium chloride   Intravenous  Once   artificial tears   Both Eyes Q8H   bisacodyl  10 mg Rectal Daily   Chlorhexidine Gluconate Cloth  6 each Topical Daily   darbepoetin (ARANESP) injection - DIALYSIS  40 mcg Subcutaneous Q Wed-1800   insulin aspart  0-20 Units Subcutaneous Q4H   insulin aspart  5 Units Subcutaneous Q4H   insulin detemir  25 Units Subcutaneous BID   [START ON 07/03/2023] methylPREDNISolone (SOLU-MEDROL) injection  40 mg Intravenous Q24H   Followed by   [name] ON 06/17/2023] methylPREDNISolone (SOLU-MEDROL) injection  20 mg Intravenous Q24H   mouth rinse  15 mL Mouth Rinse Q2H   pantoprazole (PROTONIX) IV  40 mg Intravenous QHS   sodium chloride flush  3 mL Intravenous Q12H    Infusions:   prismasol BGK 4/2.5 400 mL/hr at 06/09/23 0623    prismasol BGK 4/2.5 400 mL/hr at 06/09/23 1308   sodium chloride Stopped (05/24/23 0032)   sodium chloride Stopped (06/06/23 0237)   albumin human Stopped (05/30/23 0819)   anticoagulant sodium citrate     bivalirudin (ANGIOMAX) 250 mg in sodium chloride 0.9 % 500 mL (0.5 mg/mL) infusion 0.016 mg/kg/hr (06/09/23 0900)    ceFAZolin (ANCEF) IV Stopped (06/08/23 2213)   epinephrine Stopped (06/07/23 2327)   micafungin (MYCAMINE) 200 mg in sodium chloride 0.9 % 100 mL IVPB Stopped (06/08/23 2123)   norepinephrine (LEVOPHED) Adult infusion 2 mcg/min (06/09/23 0900)   prismasol BGK 4/2.5 1,500 mL/hr at 06/09/23 0850   TPN ADULT (ION) 75 mL/hr at 06/09/23 0900   vasopressin 0.03 Units/min (06/09/23 0900)    PRN Medications: sodium chloride, sodium chloride, acetaminophen, acetaminophen, albumin human, anticoagulant sodium citrate, artificial tears, dextrose, diphenhydrAMINE **OR** diphenhydrAMINE, HYDROmorphone (DILAUDID) injection, meperidine (DEMEROL) injection, midazolam, mouth rinse, oxidized cellulose, sodium chloride flush  Assessment/Plan   1. Post-cardiotomy vasoplegia/shock with failure to wean from CPB: Now on central VA ECMO (06/02/2023) with RA  drainage/ascending aorta return. Post-op echo with EF 55%, mild-moderate RV dysfunction. Unable to get images yesterday by TTE.  TEE on 9/16 showed EF 30%, moderate LVH, moderate RV dysfunction with mild enlargement, stable bioprosthetic aortic valve, repaired TV with mild TR, no endocarditis noted.  To OR for mediastinal washout on 06-05-23.  By TEE 06/05/2023, EF 50% with moderate LVH, mild RV enlargement and mild RV dysfunction, stable bioprosthetic aortic valve, repaired TV with mild TR.  He did not tolerate lowering of ECMO flows even with increasing of pressors due to vasoplegia. CXR with clearing left lung, right lung with persistent airspace disease not clearing with CVVH. Switch from [place] ECMO to VV ECMO with chest closure on 05/27/2023 - Lactic acid up to 6 yesterday, improved to 2.2 this am. Pressor requirement improved, currently on 2 NE + 0.03 Vaso - Running even on CRRT - LDH remains elevated but stable. Continue bival. Discussed dosing with PharmD personally. - Prognosis remains guarded.   2. Acute hypoxic respiratory failure, post-op: -Blender added to keep PaO2 down. Sweep 4. Suspect extensive PNA, on CVVH for lung clearing  - Continue VV ECMO  - BAL from 9/20 + candida and Aspergillus terreus - On micafungin. Now off amphotericin.  - CT chest with extensive infiltrates. - CXR with some improvement, right lung with ongoing diffuse airspace disease.  S - Now s/p trach - On trial of steroids  3. Severe prosthetic AI:  - Due to previous MSSA endocarditis with partial valve dehiscence s/p re-do AVR/Bentall with reimplantation of SVG-RCA and TV repair 05/12/2023.   - Continuing ancef   4. CAD s/p CABG/AVR 2017:   - s/p AVR/Bentall and re-implantation of SVG-RCA 05/24/2023. - Off ASA with platelets down.   5. ESRD:  - CVVH as above. Keep negative  6. DM2:  - management per CCM  7. Heart block:  - Post-op, now in stable junctional rhythm.    8. H/o CVA:  - Prior cerebellar CVA.   9.  Thrombocytopenia:  - monitoring. Suspect inflammatory and hemolysis.   10. Acute blood loss anemia:  - Post-op, transfuse hgb < 8.   12. FEN: Receiving TPN  13. Liver failure, post-op - AST mildly elevated w/ T bili 4, stable    Length of Stay: 22  Kristina Mcnorton N, PA-C  06/09/2023, 9:59 AM  Advanced Heart Failure Team Pager [phone] (M-F; 7a - 5p)  Please contact [facility] for night-coverage after hours (5p -7a ) and weekends on [email or web address]

## 2023-06-09 NOTE — Progress Notes (Signed)
NAME:  [name], MRN:  [id number], DOB:  [birth year], LOS: 22 ADMISSION DATE:  06/08/23, CONSULTATION DATE:  9/11 REFERRING MD:  Dr. Izora Ribas, CHIEF COMPLAINT:  aortic valve dehiscence   History of Present Illness:  Patient is [name] w/ pertinent PMH CAD s/p CABG 2017 w/ AVR, HLD, HTN, prior CVA, T2DM, CKD4 was previously on HD presents to [facility] on 2023-06-08 chest pain.  Patient recently admitted to [facility] on 5/25 w/ AKI and AMS. While in ED patient had PEA arrest w/ ROSC in about 8 minutes. Patient required dialysis on this admission. Also w/ MSSA bacteremia associated w/ tricuspid valve endocarditis. Patient transferred to [facility] on 5/25. Treated w/ 6 week course oxacillin and rifampin. This admission also suffered a R cerebellar CVA w/ MRI likely septic emboli and had cholangitis/pancreatitis requiring ERCP.  On 2023-06-08 patient admitted to [facility] w/ chest pain, dyspnea, and BLE edema. BNP 2,097. CXR w/ pulm edema. Patient started on IV lasix infusion. Cards consulted. On 9/11 patient transferred to [facility]. Patient's echo showing possible aortic valve dehiscence. Cultures repeated and started on rocephin. Patient transferred to ICU for TEE and general anesthesia and to start CRRT. PCCM consulted.  Pertinent  Medical History   Past Medical History:  Diagnosis Date   Allergy    Anemia    Anxiety    Baker's cyst of knee    Blood transfusion without reported diagnosis    as baby    Coronary artery disease    quadruple bypass - March 2016   GERD (gastroesophageal reflux disease)    Gouty arthritis    "real bad" (01/17/2013)   Heart murmur    Hypercholesteremia    Hypertension    MSSA bacteremia 01/29/2023   Myocardial infarction (HCC) 2017   PEA (Pulseless electrical activity) (HCC) 01/29/2023   Stroke (HCC)    Type II diabetes mellitus (HCC)     Significant Hospital Events: Including procedures, antibiotic start and stop dates in addition to other pertinent events    2023/06/08 admitted 9/11 echo showing aortic valve dehiscence, confirmed by TEE.  9/11 started (back) on CRRT for volume overload and known CKD 4 9/12 breathing better after CRRT 9/13 to OR for Redo of aortic valve, tricuspid valve repair and ascending aortic root replacement w/ re-implantation of SVG to RCA. Post op TEE EF 55% RV mid-mod HK, AVR/TV repair stable. Could not come off CPB. Worse pulmonary edema. High dose pressors. Cannulated and started on VA ECMO w/ central cannulation  9/16 MDT ECMO rounds this AM, plans to remove more volume, 1 U PRBCs  9/18 bronch, clot and mucus plugs removed  9/19 OR for washout  9/20 bronch 9/21 bronch 9/24 VA to VV, some bleeding and acidemia issues postop 9/25 ett exchange and bronch 10/1 trach  Interim History / Subjective:  Vent mechanics improved this am. Lactate cleared concurrently with blood transfusion. CVP remains low. Minimal mediastinal drain output.  Objective   Blood pressure (!) 119/59, pulse 74, temperature 98.4 F (36.9 C), temperature source Core, resp. rate 19, height 5\' 6"  (1.676 m), weight 73.4 kg, SpO2 100%. CVP:  [0 mmHg-16 mmHg] 5 mmHg  Vent Mode: PRVC FiO2 (%):  [50 %] 50 % Set Rate:  [24 bmp-28 bmp] 28 bmp Vt Set:  [300 mL-400 mL] 400 mL PEEP:  [5 cmH20-10 cmH20] 5 cmH20 Plateau Pressure:  [20 cmH20-30 cmH20] 30 cmH20   Intake/Output Summary (Last 24 hours) at 06/09/2023 4098 Last data filed at  06/09/2023 0700 Gross per 24 hour  Intake 2991.92 ml  Output 2923.3 ml  Net 68.62 ml   Filed Weights   06/07/23 0500 06/08/23 0500 06/09/23 0458  Weight: 76.2 kg 76.1 kg 73.4 kg    Examination: Wakes up but in pain so given dilaudid Profoundly weak ECMO circuit looks okay Lungs sounds pretty good Mediastinal drain minimal output Trach CDI Plats ~28, PEEP 5 (changed around settings to push VV ECMO liberation) Ext no edema ECMO circuit looks okay  CBC/BMP/hemolysis/CXR all reviewed during ecmo rounds  Assessment &  Plan:  Post cardiotomy vasoplegia/cardioplegia w/ failure to wean from CPB- improved; 9/24 converted to VV Acute on chronic combined HF, Hx MSSA endocarditis- S/p AVR/Bentall and reimplantation of Coronaries and TV repair 06-Jun-2023 Acute respiratory failure w/ hypoxia due to volume overload, with untreated OSA, and dense nonresolving R infiltrate growing aspergillus terreus CKD 4. Had recently been on HD- now on CRRT Circuit related hemolysis, thrombocytopenia consumptive- stable Hyperbilirubinemia TF intolerance- ongoing issue now on TPN CAD s/p CABG in 2017 w/ AVR HTN HLD Hx of CVA DMT2 Leukocytosis, multifactorial   - On ancef for presumed lingering IE - Micafungin for a. Terreus, duration per ID - Continue trial of steroids; given some improvement, will do a 1-2 week course - Push vent support to facilitate sweep wean today - Bival for AC - Sweep for pH 7.35-7.45 - Vasopressin, levo for MAP 65; low threshold for transfusion if levo needs go up - Continue TPN while we await return of bowel function; still not really doing great in this regard, question intermittent interstinal ischemia - Sedation PRN - No role for further CT scans unless it is to help GOC discussions - Guarded prognosis - Discussed above with AHF+TCTS+ECMO specialists   Best Practice (right click and "Reselect all SmartList Selections" daily)   Diet/type: TPN DVT prophylaxis: bival GI prophylaxis: PPI Lines: Central line, Dialysis Catheter, and Arterial Line Foley: yes Code Status:  full code Last date of multidisciplinary goals of care discussion [per TCTS and AHF]   This patient is critically ill with multiple organ system failure which requires frequent high complexity decision making, assessment, support, evaluation, and titration of therapies. This was completed through the application of advanced monitoring technologies and extensive interpretation of multiple databases. During this encounter critical care  time was devoted to patient care services described in this note for 34 minutes.  Lorin Glass, MD 06/09/23 8:08 AM [facility]  For contact information, see Amion. If no response to pager, please call PCCM consult pager. After hours, 7PM- 7AM, please call Elink.

## 2023-06-09 NOTE — Progress Notes (Signed)
      [address]       [place] [address]             [phone]      Stable day   BP 105/65   Pulse 75   Temp 98.2 F (36.8 C) (Core)   Resp (!) 29   Ht 5\' 6"  (1.676 m)   Wt 73.4 kg   SpO2 100%   BMI 26.12 kg/m  Norepi @ 2. Vasopressin 0.03 Remains on ECMO  Intake/Output Summary (Last 24 hours) at 06/09/2023 1813 Last data filed at 06/09/2023 1800 Gross per 24 hour  Intake 2956.37 ml  Output 3406.1 ml  Net -449.73 ml   K 4.1 Hgb 9.8 WBC 43K PLT 24K down from 30K  Tejasvi Brissett C. Dorris Fetch, MD [facility] [phone]

## 2023-06-09 NOTE — Progress Notes (Signed)
ANTICOAGULATION CONSULT NOTE - Follow up Consult  Pharmacy Consult for bivalirudin Indication:  VV ECMO  Allergies  Allergen Reactions   Other Anaphylaxis    Mushrooms  Not listed on MAR    Plavix [Clopidogrel Bisulfate] Other (See Comments)    TTP Not listed on the [facility]   Fleet Enema [Enema] Other (See Comments)    Unknown reaction   Lovenox [Enoxaparin] Other (See Comments)    Unknown reaction   Morphine Other (See Comments)    Unknown reaction   Nsaids Other (See Comments)    Unknown reaction   Hydrocodone-Acetaminophen Itching    Patient Measurements: Height: 5\' 6"  (167.6 cm) Weight: 73.4 kg (161 lb 13.1 oz) IBW/kg (Calculated) : 63.8 Heparin Dosing Weight: 81.3 kg  Vital Signs: Temp: 98.4 F (36.9 C) (10/03 0000) Temp Source: Core (10/03 0000) Pulse Rate: 76 (10/03 0700)  Labs: Recent Labs    06/07/23 0354 06/07/23 0727 06/08/23 0424 06/08/23 0432 06/08/23 1137 06/08/23 1638 06/08/23 1952 06/09/23 0355 06/09/23 0402  HGB 8.0*   < > 7.7*   < > 8.0* 7.9*  9.2* 10.5* 10.1* 10.5*  HCT 23.9*   < > 23.3*   < > 23.9* 23.1*  27.0* 31.0* 28.6* 31.0*  PLT 30*   < > 66*  --  56* 47*  --  30*  --   APTT 46*   < > 47*  --   --  50*  --  46*  --   LABPROT 18.2*  --  18.5*  --   --   --   --  18.9*  --   INR 1.5*  --  1.5*  --   --   --   --  1.6*  --   CREATININE 1.11   < > 1.16  --   --  1.18  --  1.15  --    < > = values in this interval not displayed.    Estimated Creatinine Clearance: 59.3 mL/min (by C-G formula based on SCr of 1.15 mg/dL).   Assessment: 62 yom underwent Bentall/AVR, TV repair, re-implantation of SVG-RCA complicated with vasoplegia and pulmonary edema requiring central VA ECMO. On 9/24 patient underwent transition VA to VV ECMO.   aPTT is slightly subtherapeutic at 46, on bivalirudin@0 .0.14 mg/kg/hr. Hgb 10.1, plt 30. LDH up slightly to 1106. Fibrinogen up slightly to 640. Small amount of fibrin - stable.   Goal of Therapy:  aPTT 50-70  seconds Monitor platelets by anticoagulation protocol: Yes   Plan:  Increase bivalirudin to 0.016 mg/kg/hr  Monitor q12 hr aPTT and CBC, LDH, fibrinogen and for s/sx of bleeding.  Thank you for allowing pharmacy to participate in this patient's care,  Calton Dach, PharmD, BCCCP Clinical Pharmacist 06/09/2023 7:26 AM

## 2023-06-10 ENCOUNTER — Encounter (HOSPITAL_COMMUNITY): Admission: EM | Disposition: E | Payer: Self-pay | Source: Home / Self Care | Attending: Surgery

## 2023-06-10 ENCOUNTER — Inpatient Hospital Stay (HOSPITAL_COMMUNITY): Payer: PPO

## 2023-06-10 DIAGNOSIS — R570 Cardiogenic shock: Secondary | ICD-10-CM | POA: Diagnosis not present

## 2023-06-10 HISTORY — PX: FOREIGN BODY REMOVAL: SHX962

## 2023-06-10 HISTORY — PX: VIDEO BRONCHOSCOPY: SHX5072

## 2023-06-10 LAB — CBC
HCT: 27.4 % — ABNORMAL LOW (ref 39.0–52.0)
HCT: 26.5 % — ABNORMAL LOW (ref 39.0–52.0)
HCT: 26.1 % — ABNORMAL LOW (ref 39.0–52.0)
Hemoglobin: 9.2 g/dL — ABNORMAL LOW (ref 13.0–17.0)
Hemoglobin: 8.9 g/dL — ABNORMAL LOW (ref 13.0–17.0)
Hemoglobin: 8.7 g/dL — ABNORMAL LOW (ref 13.0–17.0)
MCH: 29.8 pg (ref 26.0–34.0)
MCH: 30.1 pg (ref 26.0–34.0)
MCH: 30.2 pg (ref 26.0–34.0)
MCHC: 33.6 g/dL (ref 30.0–36.0)
MCHC: 33.6 g/dL (ref 30.0–36.0)
MCHC: 33.3 g/dL (ref 30.0–36.0)
MCV: 88.7 fL (ref 80.0–100.0)
MCV: 89.5 fL (ref 80.0–100.0)
MCV: 90.6 fL (ref 80.0–100.0)
Platelets: 23 10*3/uL — CL (ref 150–400)
Platelets: 63 10*3/uL — ABNORMAL LOW (ref 150–400)
Platelets: 49 10*3/uL — ABNORMAL LOW (ref 150–400)
RBC: 3.09 MIL/uL — ABNORMAL LOW (ref 4.22–5.81)
RBC: 2.96 MIL/uL — ABNORMAL LOW (ref 4.22–5.81)
RBC: 2.88 MIL/uL — ABNORMAL LOW (ref 4.22–5.81)
RDW: 17.3 % — ABNORMAL HIGH (ref 11.5–15.5)
RDW: 17.3 % — ABNORMAL HIGH (ref 11.5–15.5)
RDW: 17.5 % — ABNORMAL HIGH (ref 11.5–15.5)
WBC: 46.3 10*3/uL — ABNORMAL HIGH (ref 4.0–10.5)
WBC: 67.4 10*3/uL (ref 4.0–10.5)
WBC: 62.9 10*3/uL (ref 4.0–10.5)
nRBC: 1.9 % — ABNORMAL HIGH (ref 0.0–0.2)
nRBC: 1.9 % — ABNORMAL HIGH (ref 0.0–0.2)
nRBC: 2.8 % — ABNORMAL HIGH (ref 0.0–0.2)

## 2023-06-10 LAB — RENAL FUNCTION PANEL
Albumin: 2.4 g/dL — ABNORMAL LOW (ref 3.5–5.0)
Albumin: 2.5 g/dL — ABNORMAL LOW (ref 3.5–5.0)
Anion gap: 13 (ref 5–15)
Anion gap: 14 (ref 5–15)
BUN: 50 mg/dL — ABNORMAL HIGH (ref 8–23)
BUN: 46 mg/dL — ABNORMAL HIGH (ref 8–23)
CO2: 23 mmol/L (ref 22–32)
CO2: 23 mmol/L (ref 22–32)
Calcium: 8.3 mg/dL — ABNORMAL LOW (ref 8.9–10.3)
Calcium: 8.5 mg/dL — ABNORMAL LOW (ref 8.9–10.3)
Chloride: 98 mmol/L (ref 98–111)
Chloride: 96 mmol/L — ABNORMAL LOW (ref 98–111)
Creatinine, Ser: 1.06 mg/dL (ref 0.61–1.24)
Creatinine, Ser: 1.09 mg/dL (ref 0.61–1.24)
GFR, Estimated: >60 mL/min (ref >60)
GFR, Estimated: >60 mL/min (ref >60)
Glucose, Bld: 170 mg/dL — ABNORMAL HIGH (ref 70–99)
Glucose, Bld: 263 mg/dL — ABNORMAL HIGH (ref 70–99)
Phosphorus: 3.1 mg/dL (ref 2.5–4.6)
Phosphorus: 4.5 mg/dL (ref 2.5–4.6)
Potassium: 3.7 mmol/L (ref 3.5–5.1)
Potassium: 4.5 mmol/L (ref 3.5–5.1)
Sodium: 134 mmol/L — ABNORMAL LOW (ref 135–145)
Sodium: 133 mmol/L — ABNORMAL LOW (ref 135–145)

## 2023-06-10 LAB — CULTURE, BAL-QUANTITATIVE W GRAM STAIN: Culture: 3000 — AB

## 2023-06-10 LAB — POCT I-STAT 7, (LYTES, BLD GAS, ICA,H+H)
Acid-Base Excess: 0 mmol/L (ref 0.0–2.0)
Acid-Base Excess: 1 mmol/L (ref 0.0–2.0)
Acid-Base Excess: 0 mmol/L (ref 0.0–2.0)
Acid-Base Excess: 0 mmol/L (ref 0.0–2.0)
Acid-Base Excess: 0 mmol/L (ref 0.0–2.0)
Acid-base deficit: 2 mmol/L (ref 0.0–2.0)
Bicarbonate: 25.5 mmol/L (ref 20.0–28.0)
Bicarbonate: 24.7 mmol/L (ref 20.0–28.0)
Bicarbonate: 25.1 mmol/L (ref 20.0–28.0)
Bicarbonate: 25.7 mmol/L (ref 20.0–28.0)
Bicarbonate: 24.5 mmol/L (ref 20.0–28.0)
Bicarbonate: 23.9 mmol/L (ref 20.0–28.0)
Calcium, Ion: 1.19 mmol/L (ref 1.15–1.40)
Calcium, Ion: 1.12 mmol/L — ABNORMAL LOW (ref 1.15–1.40)
Calcium, Ion: 1.15 mmol/L (ref 1.15–1.40)
Calcium, Ion: 1.15 mmol/L (ref 1.15–1.40)
Calcium, Ion: 1.16 mmol/L (ref 1.15–1.40)
Calcium, Ion: 1.13 mmol/L — ABNORMAL LOW (ref 1.15–1.40)
HCT: 29 % — ABNORMAL LOW (ref 39.0–52.0)
HCT: 30 % — ABNORMAL LOW (ref 39.0–52.0)
HCT: 29 % — ABNORMAL LOW (ref 39.0–52.0)
HCT: 29 % — ABNORMAL LOW (ref 39.0–52.0)
HCT: 28 % — ABNORMAL LOW (ref 39.0–52.0)
HCT: 31 % — ABNORMAL LOW (ref 39.0–52.0)
Hemoglobin: 9.9 g/dL — ABNORMAL LOW (ref 13.0–17.0)
Hemoglobin: 10.2 g/dL — ABNORMAL LOW (ref 13.0–17.0)
Hemoglobin: 9.9 g/dL — ABNORMAL LOW (ref 13.0–17.0)
Hemoglobin: 9.9 g/dL — ABNORMAL LOW (ref 13.0–17.0)
Hemoglobin: 9.5 g/dL — ABNORMAL LOW (ref 13.0–17.0)
Hemoglobin: 10.5 g/dL — ABNORMAL LOW (ref 13.0–17.0)
O2 Saturation: 99 %
O2 Saturation: 99 %
O2 Saturation: 99 %
O2 Saturation: 97 %
O2 Saturation: 96 %
O2 Saturation: 94 %
Patient temperature: 37
Patient temperature: 36.8
Patient temperature: 36.9
Patient temperature: 36.5
Patient temperature: 36.8
Patient temperature: 36.9
Potassium: 3.6 mmol/L (ref 3.5–5.1)
Potassium: 3.5 mmol/L (ref 3.5–5.1)
Potassium: 3.5 mmol/L (ref 3.5–5.1)
Potassium: 3.6 mmol/L (ref 3.5–5.1)
Potassium: 4.3 mmol/L (ref 3.5–5.1)
Potassium: 4.5 mmol/L (ref 3.5–5.1)
Sodium: 137 mmol/L (ref 135–145)
Sodium: 138 mmol/L (ref 135–145)
Sodium: 139 mmol/L (ref 135–145)
Sodium: 138 mmol/L (ref 135–145)
Sodium: 137 mmol/L (ref 135–145)
Sodium: 136 mmol/L (ref 135–145)
TCO2: 27 mmol/L (ref 22–32)
TCO2: 26 mmol/L (ref 22–32)
TCO2: 26 mmol/L (ref 22–32)
TCO2: 27 mmol/L (ref 22–32)
TCO2: 26 mmol/L (ref 22–32)
TCO2: 25 mmol/L (ref 22–32)
pCO2 arterial: 44.2 mm[Hg] (ref 32–48)
pCO2 arterial: 36.8 mm[Hg] (ref 32–48)
pCO2 arterial: 41.5 mm[Hg] (ref 32–48)
pCO2 arterial: 46.7 mm[Hg] (ref 32–48)
pCO2 arterial: 48 mm[Hg] (ref 32–48)
pCO2 arterial: 36.6 mm[Hg] (ref 32–48)
pH, Arterial: 7.369 (ref 7.35–7.45)
pH, Arterial: 7.434 (ref 7.35–7.45)
pH, Arterial: 7.39 (ref 7.35–7.45)
pH, Arterial: 7.346 — ABNORMAL LOW (ref 7.35–7.45)
pH, Arterial: 7.315 — ABNORMAL LOW (ref 7.35–7.45)
pH, Arterial: 7.421 (ref 7.35–7.45)
pO2, Arterial: 168 mm[Hg] — ABNORMAL HIGH (ref 83–108)
pO2, Arterial: 146 mm[Hg] — ABNORMAL HIGH (ref 83–108)
pO2, Arterial: 148 mm[Hg] — ABNORMAL HIGH (ref 83–108)
pO2, Arterial: 93 mm[Hg] (ref 83–108)
pO2, Arterial: 86 mm[Hg] (ref 83–108)
pO2, Arterial: 70 mm[Hg] — ABNORMAL LOW (ref 83–108)

## 2023-06-10 LAB — PROTIME-INR
INR: 1.5 — ABNORMAL HIGH (ref 0.8–1.2)
Prothrombin Time: 18.3 s — ABNORMAL HIGH (ref 11.4–15.2)

## 2023-06-10 LAB — HEPATIC FUNCTION PANEL
ALT: 15 U/L (ref 0–44)
AST: 97 U/L — ABNORMAL HIGH (ref 15–41)
Albumin: 2.4 g/dL — ABNORMAL LOW (ref 3.5–5.0)
Alkaline Phosphatase: 136 U/L — ABNORMAL HIGH (ref 38–126)
Bilirubin, Direct: 1.9 mg/dL — ABNORMAL HIGH (ref 0.0–0.2)
Indirect Bilirubin: 1.5 mg/dL — ABNORMAL HIGH (ref 0.3–0.9)
Total Bilirubin: 3.4 mg/dL — ABNORMAL HIGH (ref 0.3–1.2)
Total Protein: 7.6 g/dL (ref 6.5–8.1)

## 2023-06-10 LAB — GLUCOSE, CAPILLARY
Glucose-Capillary: 175 mg/dL — ABNORMAL HIGH (ref 70–99)
Glucose-Capillary: 152 mg/dL — ABNORMAL HIGH (ref 70–99)
Glucose-Capillary: 159 mg/dL — ABNORMAL HIGH (ref 70–99)
Glucose-Capillary: 209 mg/dL — ABNORMAL HIGH (ref 70–99)
Glucose-Capillary: 223 mg/dL — ABNORMAL HIGH (ref 70–99)

## 2023-06-10 LAB — GLOBAL TEG PANEL
CFF Max Amplitude: 41.6 mm — ABNORMAL HIGH (ref 15–32)
CK with Heparinase (R): 7.3 min (ref 4.3–8.3)
Citrated Functional Fibrinogen: 759.1 mg/dL — ABNORMAL HIGH (ref 278–581)
Citrated Kaolin (K): 0.9 min (ref 0.8–2.1)
Citrated Kaolin (MA): 65.8 mm (ref 52–69)
Citrated Kaolin (R): 7.5 min (ref 4.6–9.1)
Citrated Kaolin Angle: 76.8 deg (ref 63–78)
Citrated Rapid TEG (MA): 65.8 mm (ref 52–70)

## 2023-06-10 LAB — APTT
aPTT: 45 s — ABNORMAL HIGH (ref 24–36)
aPTT: 32 s (ref 24–36)

## 2023-06-10 LAB — LACTATE DEHYDROGENASE: LDH: 942 U/L — ABNORMAL HIGH (ref 98–192)

## 2023-06-10 LAB — FIBRINOGEN: Fibrinogen: 460 mg/dL (ref 210–475)

## 2023-06-10 LAB — PREPARE RBC (CROSSMATCH)

## 2023-06-10 LAB — CG4 I-STAT (LACTIC ACID): Lactic Acid, Venous: 2 mmol/L (ref 0.5–1.9)

## 2023-06-10 SURGERY — VIDEO BRONCHOSCOPY WITHOUT FLUORO
Anesthesia: Moderate Sedation | Laterality: Left

## 2023-06-10 MED ORDER — VASOPRESSIN 20 UNITS/100 ML INFUSION FOR SHOCK
0.0400 [IU]/min | INTRAVENOUS | Status: DC
  Administered 2023-06-10 – 2023-06-12 (×6): 0.04 [IU]/min via INTRAVENOUS
  Filled 2023-06-10 (×5): qty 100

## 2023-06-10 MED ORDER — HYDROMORPHONE HCL-NACL 50-0.9 MG/50ML-% IV SOLN
0.5000 mg/h | INTRAVENOUS | Status: DC
  Administered 2023-06-10: 1.5 mg/h via INTRAVENOUS
  Administered 2023-06-10: 0.5 mg/h via INTRAVENOUS
  Administered 2023-06-11 (×2): 4 mg/h via INTRAVENOUS
  Administered 2023-06-12: 4.5 mg/h via INTRAVENOUS
  Filled 2023-06-10 (×5): qty 50

## 2023-06-10 MED ORDER — INSULIN ASPART 100 UNIT/ML IJ SOLN
0.0000 [IU] | Freq: Three times a day (TID) | INTRAMUSCULAR | Status: DC
  Administered 2023-06-10: 5 [IU] via SUBCUTANEOUS
  Administered 2023-06-11 (×2): 11 [IU] via SUBCUTANEOUS

## 2023-06-10 MED ORDER — ROCURONIUM BROMIDE 10 MG/ML (PF) SYRINGE
PREFILLED_SYRINGE | INTRAVENOUS | Status: AC
  Administered 2023-06-10: 50 mg
  Filled 2023-06-10: qty 10

## 2023-06-10 MED ORDER — HYDROMORPHONE BOLUS VIA INFUSION
0.2500 mg | INTRAVENOUS | Status: DC | PRN
  Administered 2023-06-11 (×2): 0.5 mg via INTRAVENOUS
  Administered 2023-06-12: 1 mg via INTRAVENOUS

## 2023-06-10 MED ORDER — TRANEXAMIC ACID-NACL 1000-0.7 MG/100ML-% IV SOLN
1000.0000 mg | Freq: Once | INTRAVENOUS | Status: AC
  Administered 2023-06-10: 1000 mg via INTRAVENOUS
  Filled 2023-06-10: qty 100

## 2023-06-10 MED ORDER — SODIUM CHLORIDE 0.9% IV SOLUTION: Freq: Once | INTRAVENOUS | Status: AC

## 2023-06-10 MED ORDER — INSULIN ASPART 100 UNIT/ML IJ SOLN: 0.0000 [IU] | INTRAMUSCULAR | Status: DC

## 2023-06-10 MED ORDER — CALCIUM GLUCONATE-NACL 2-0.675 GM/100ML-% IV SOLN
2.0000 g | Freq: Once | INTRAVENOUS | Status: AC
  Administered 2023-06-10: 2000 mg via INTRAVENOUS
  Filled 2023-06-10: qty 100

## 2023-06-10 MED ORDER — ETOMIDATE 2 MG/ML IV SOLN
INTRAVENOUS | Status: AC
  Administered 2023-06-10: 20 mg
  Filled 2023-06-10: qty 20

## 2023-06-10 MED ORDER — TRANEXAMIC ACID FOR INHALATION
500.0000 mg | Freq: Three times a day (TID) | RESPIRATORY_TRACT | Status: DC
  Administered 2023-06-10 – 2023-06-11 (×4): 500 mg via RESPIRATORY_TRACT
  Administered 2023-06-11: 1000 mg via RESPIRATORY_TRACT
  Administered 2023-06-11 – 2023-06-12 (×3): 500 mg via RESPIRATORY_TRACT
  Filled 2023-06-10 (×9): qty 10

## 2023-06-10 MED ORDER — HYDROMORPHONE HCL 1 MG/ML IJ SOLN: 0.5000 mg | Freq: Once | INTRAMUSCULAR | Status: DC

## 2023-06-10 MED ORDER — TRACE MINERALS CU-MN-SE-ZN 300-55-60-3000 MCG/ML IV SOLN
INTRAVENOUS | Status: AC
  Filled 2023-06-10: qty 936

## 2023-06-10 NOTE — Progress Notes (Signed)
[facility]    Date of Admission:  05/28/2023   Total days of antibiotics 14 micafungin/cefazolin   ID: [name] is a 62 y.o. male with   Principal Problem:   Cardiogenic shock (HCC) Active Problems:   CAD (coronary artery disease)   Type 2 diabetes mellitus without complication, without long-term current use of insulin (HCC)   OSA on CPAP   Symptomatic anemia   Acute on chronic congestive heart failure (HCC)   Prosthetic aortic valve failure   Acute respiratory failure with hypoxia (HCC)   Anemia   Protein-calorie malnutrition, severe    Subjective: Afebrile however did have acute event of pulmonary hemorrhage this morning, had bronch that cleared clot.  Concern that it is high risk for recurrence.  Medications:   sodium chloride   Intravenous Once   sodium chloride   Intravenous Once   artificial tears   Both Eyes Q8H   bisacodyl  10 mg Rectal Daily   Chlorhexidine Gluconate Cloth  6 each Topical Daily   darbepoetin (ARANESP) injection - DIALYSIS  40 mcg Subcutaneous Q Wed-1800    HYDROmorphone (DILAUDID) injection  0.5 mg Intravenous Once   insulin aspart  0-15 Units Subcutaneous Q8H   methylPREDNISolone (SOLU-MEDROL) injection  40 mg Intravenous Q24H   Followed by   [name] ON 06/17/2023] methylPREDNISolone (SOLU-MEDROL) injection  20 mg Intravenous Q24H   mouth rinse  15 mL Mouth Rinse Q2H   pantoprazole (PROTONIX) IV  40 mg Intravenous QHS   sodium chloride flush  3 mL Intravenous Q12H   tranexamic acid  500 mg Nebulization Q8H    Objective: Vital signs in last 24 hours: Temp:  [98.2 F (36.8 C)-98.8 F (37.1 C)] 98.4 F (36.9 C) 07/09/2023 1057) Pulse Rate:  [67-114] 88 09-Jul-2023 1200) Resp:  [0-32] 14 2023-07-09 1200) BP: (105-145)/(58-74) 108/62 07/09/2023 1131) SpO2:  [96 %-100 %] 98 % Jul 09, 2023 1200) Arterial Line BP: (53-141)/(27-73) 102/59 07/09/23 1200) FiO2 (%):  [50 %-100 %] 50 % Jul 09, 2023 1131) Weight:  [72.6 kg] 72.6 kg 07-09-2023  0500)  Physical Exam  Constitutional: He appears well-developed and well-nourished. No distress.  HENT: trach Cardiovascular: Normal rate, regular rhythm and normal heart sounds. Exam reveals no gallop and no friction rub.  No murmur heard.  Pulmonary/Chest: Effort normal and breath sounds normal. No respiratory distress. He has no wheezes.  Abdominal: Soft. Bowel sounds are decreased, He exhibits no distension. There is no tenderness.  Ext; ischemic changes/dry gangrene to bilateral toes  Lab Results Recent Labs    06/09/23 1654 06/09/23 2002 07/09/23 0348 07/09/23 0402 07/09/23 0937 07-09-23 1040  WBC 43.3*  --  46.3*  --   --  67.4*  HGB 9.8*   < > 9.2* 9.9* 10.2* 8.9*  HCT 28.3*   < > 27.4* 29.0* 30.0* 26.5*  NA 135   < > 134* 137 138  --   K 4.1   < > 3.7 3.6 3.5  --   CL 99  --  98  --   --   --   CO2 22  --  23  --   --   --   BUN 51*  --  50*  --   --   --   CREATININE 1.12  --  1.06  --   --   --    < > = values in this interval not displayed.   Liver Panel Recent Labs    06/08/23 0424 06/08/23  1638 06/09/23 0355 06/09/23 1654 06/21/2023 0348  PROT 8.5*  --  8.0  --  7.6  ALBUMIN 2.3*  2.2*   < > 2.2* 2.4* 2.4*  2.4*  AST 98*  --  100*  --  97*  ALT 10  --  12  --  15  ALKPHOS 135*  --  129*  --  136*  BILITOT 4.1*  --  4.0*  --  3.4*  BILIDIR 2.2*  --   --   --  1.9*  IBILI 1.9*  --   --   --  1.5*   < > = values in this interval not displayed.    Microbiology: --------------reviewed------------------- Studies/Results: DG CHEST PORT 1 VIEW  Result Date: 06/21/2023 CLINICAL DATA:  [id number] Patient receiving extracorporeal membrane oxygenation (ECMO) [id number]. EXAM: PORTABLE CHEST 1 VIEW COMPARISON:  06/09/2023. FINDINGS: Redemonstration of bilateral heterogeneous opacities, right more than left, without significant interval change. There is probable layering left pleural effusion, new/more pronounced since the prior. No significant right pleural effusion.  Stable cardio-mediastinal silhouette. No acute osseous abnormalities. The soft tissues are within normal limits. Tracheostomy tube, right IJ hemodialysis catheter, enteric tube, bilateral pleural drainage catheters and ECMO device are essentially unchanged in position. IMPRESSION: 1. Stable bilateral heterogeneous opacities, right more than left. 2. Probable layering left pleural effusion, new/more pronounced since the prior. 3. Stable support apparatus. Electronically Signed   By: Jules Schick M.D.   On: 2023-06-21 08:16   DG CHEST PORT 1 VIEW  Result Date: 06/09/2023 CLINICAL DATA:  ECMO. EXAM: PORTABLE CHEST 1 VIEW COMPARISON:  June 08, 2023. FINDINGS: Tracheostomy tube is unchanged. Nasogastric tube is seen entering stomach. Right internal jugular catheter is unchanged. Bilateral chest tubes are noted without pneumothorax. ECMO device is noted. Stable bilateral lung opacities are noted. IMPRESSION: Stable support apparatus.  Stable bilateral opacities. Electronically Signed   By: Lupita Raider M.D.   On: 06/09/2023 10:32     Assessment/Plan: Candidal pneumonia/invasive fungal infection = already on day 14 of proper coverage without significant improvement difficulty to tell with pulm hemorrhage is due to invasive infection. Continue on micafungin for the time being  Hx of mssa PVE = continue on cefazolin renally dosed  Pulmonary hemorrhage = pccm to use TXA to prevent clot from dissolving to help stabilize.  Overall condition very guarded. Agree goc/eol discussion would be warranted if recurrent bleeding in the setting of all other insults he has suffered.  Will follow patient from afar   [facility] Pager: [phone]  06-21-23, 12:22 PM

## 2023-06-10 NOTE — Progress Notes (Signed)
Day of Surgery Procedure(s) (LRB): VIDEO BRONCHOSCOPY WITHOUT FLUORO (Left) FOREIGN BODY REMOVAL Subjective: Patient examined, today's chest x-ray image personally reviewed and patient discussed with ECMO team for coordination of care.  Patient had significant hemoptysis of bright red blood to the ventilator circuit.  He had a percutaneous tracheostomy placed earlier in the week.  He has had Aspergillus growing from BAL washings.  Today's chest x-ray shows continued improvement in the dense airspace disease of the right lung.  Bronchoscopy today showed the bleeding originating in the left lower lobe.  He received cryo and platelets and topical antifibrinolytic agent.  Follow-up hemoglobin remains fairly stable.  The VV ECMO settings were adjusted but remained fairly stable. The bivalirudin infusion was placed on hold. Objective: Vital signs in last 24 hours: Temp:  [98.2 F (36.8 C)-98.8 F (37.1 C)] 98.4 F (36.9 C) (10/04 1057) Pulse Rate:  [67-114] 87 (10/04 1315) Cardiac Rhythm: Bundle branch block;Junctional rhythm (10/04 1200) Resp:  [0-32] 12 (10/04 1315) BP: (105-145)/(58-74) 108/62 (10/04 1131) SpO2:  [96 %-100 %] 97 % (10/04 1315) Arterial Line BP: (53-141)/(27-73) 93/56 (10/04 1315) FiO2 (%):  [50 %-100 %] 50 % (10/04 1200) Weight:  [72.6 kg] 72.6 kg (10/04 0500)  Hemodynamic parameters for last 24 hours: CVP:  [0 mmHg-54 mmHg] 7 mmHg  Intake/Output from previous day: 10/03 0701 - 10/04 0700 In: 2730.2 [I.V.:2103.5; IV Piggyback:566.8] Out: 2793.5 [Stool:170] Intake/Output this shift: Total I/O In: 1218.2 [I.V.:671.2; Blood:300; IV Piggyback:247.1] Out: 673.6 [Emesis/NG output:100; Stool:50; Chest Tube:4] Exam Patient sedated on ventilator through trach Breath sounds diminished on the left side No cardiac murmur, paced rhythm Abdomen nondistended Lab Results: Recent Labs    06/18/2023 0348 07/06/2023 0402 06/28/2023 1040 06/13/2023 1042 06/14/2023 1214  WBC 46.3*  --   67.4*  --   --   HGB 9.2*   < > 8.9* 9.9* 9.9*  HCT 27.4*   < > 26.5* 29.0* 29.0*  PLT 23*  --  63*  --   --    < > = values in this interval not displayed.   BMET:  Recent Labs    06/09/23 1654 06/09/23 2002 07/07/2023 0348 07/07/2023 0402 07/03/2023 1042 06/28/2023 1214  NA 135   < > 134*   < > 139 138  K 4.1   < > 3.7   < > 3.5 3.6  CL 99  --  98  --   --   --   CO2 22  --  23  --   --   --   GLUCOSE 283*  --  170*  --   --   --   BUN 51*  --  50*  --   --   --   CREATININE 1.12  --  1.06  --   --   --   CALCIUM 8.4*  --  8.3*  --   --   --    < > = values in this interval not displayed.    PT/INR:  Recent Labs    06/07/2023 0348  LABPROT 18.3*  INR 1.5*   ABG    Component Value Date/Time   PHART 7.346 (L) 06/20/2023 1214   HCO3 25.7 06/09/2023 1214   TCO2 27 07/07/2023 1214   ACIDBASEDEF 1.0 06/09/2023 2002   O2SAT 97 06/14/2023 1214   CBG (last 3)  Recent Labs    07/04/2023 0400 06/23/2023 0806 06/15/2023 1103  GLUCAP 175* 152* 159*    Assessment/Plan: S/P Procedure(s) (LRB): VIDEO BRONCHOSCOPY  WITHOUT FLUORO (Left) FOREIGN BODY REMOVAL Patient mains critically ill on VV ECMO support, CRRT, with moderate to high pressor support with norepinephrine and vasopressin.  Severe airway bleeding today associated with fungal pulmonary infection and ongoing requirement for anticoagulation. Prognosis is poor with high risk of recurrent airway bleeding.  LOS: 23 days    Lovett Sox 07/07/2023

## 2023-06-10 NOTE — Plan of Care (Signed)
Problem: Activity: Goal: Ability to tolerate increased activity will improve Outcome: Not Progressing   Problem: Respiratory: Goal: Ability to maintain a clear airway and adequate ventilation will improve Outcome: Not Progressing   Problem: Role Relationship: Goal: Method of communication will improve Outcome: Not Progressing   Problem: Education: Goal: Ability to manage disease process will improve Outcome: Not Progressing   Problem: Cardiac: Goal: Ability to achieve and maintain adequate cardiopulmonary perfusion will improve Outcome: Not Progressing   Problem: Neurologic: Goal: Promote progressive neurologic recovery Outcome: Not Progressing   Problem: Skin Integrity: Goal: Risk for impaired skin integrity will be minimized. Outcome: Not Progressing   Problem: Fluid Volume: Goal: Ability to maintain a balanced intake and output will improve Outcome: Not Progressing   Problem: Metabolic: Goal: Ability to maintain appropriate glucose levels will improve Outcome: Not Progressing   Problem: Nutritional: Goal: Maintenance of adequate nutrition will improve Outcome: Not Progressing Goal: Progress toward achieving an optimal weight will improve Outcome: Not Progressing   Problem: Skin Integrity: Goal: Risk for impaired skin integrity will decrease Outcome: Not Progressing   Problem: Tissue Perfusion: Goal: Adequacy of tissue perfusion will improve Outcome: Not Progressing   Problem: Clinical Measurements: Goal: Ability to maintain clinical measurements within normal limits will improve Outcome: Not Progressing Goal: Will remain free from infection Outcome: Not Progressing Goal: Diagnostic test results will improve Outcome: Not Progressing Goal: Respiratory complications will improve Outcome: Not Progressing Goal: Cardiovascular complication will be avoided Outcome: Not Progressing   Problem: Activity: Goal: Risk for activity intolerance will  decrease Outcome: Not Progressing   Problem: Nutrition: Goal: Adequate nutrition will be maintained Outcome: Not Progressing   Problem: Coping: Goal: Level of anxiety will decrease Outcome: Not Progressing   Problem: Elimination: Goal: Will not experience complications related to bowel motility Outcome: Not Progressing Goal: Will not experience complications related to urinary retention Outcome: Not Progressing   Problem: Pain Managment: Goal: General experience of comfort will improve Outcome: Not Progressing   Problem: Safety: Goal: Ability to remain free from injury will improve Outcome: Not Progressing   Problem: Skin Integrity: Goal: Risk for impaired skin integrity will decrease Outcome: Not Progressing   Problem: Education: Goal: Ability to describe self-care measures that may prevent or decrease complications (Diabetes Survival Skills Education) will improve Outcome: Not Progressing   Problem: Coping: Goal: Ability to adjust to condition or change in health will improve Outcome: Not Progressing   Problem: Fluid Volume: Goal: Ability to maintain a balanced intake and output will improve Outcome: Not Progressing   Problem: Health Behavior/Discharge Planning: Goal: Ability to identify and utilize available resources and services will improve Outcome: Not Progressing Goal: Ability to manage health-related needs will improve Outcome: Not Progressing   Problem: Metabolic: Goal: Ability to maintain appropriate glucose levels will improve Outcome: Not Progressing   Problem: Nutritional: Goal: Maintenance of adequate nutrition will improve Outcome: Not Progressing Goal: Progress toward achieving an optimal weight will improve Outcome: Not Progressing   Problem: Skin Integrity: Goal: Risk for impaired skin integrity will decrease Outcome: Not Progressing   Problem: Tissue Perfusion: Goal: Adequacy of tissue perfusion will improve Outcome: Not Progressing    Problem: Education: Goal: Will demonstrate proper wound care and an understanding of methods to prevent future damage Outcome: Not Progressing Goal: Knowledge of disease or condition will improve Outcome: Not Progressing Goal: Knowledge of the prescribed therapeutic regimen will improve Outcome: Not Progressing Goal: Individualized Educational Video(s) Outcome: Not Progressing   Problem: Activity: Goal: Risk for activity intolerance  will decrease Outcome: Not Progressing   Problem: Cardiac: Goal: Will achieve and/or maintain hemodynamic stability Outcome: Not Progressing   Problem: Clinical Measurements: Goal: Postoperative complications will be avoided or minimized Outcome: Not Progressing

## 2023-06-10 NOTE — Progress Notes (Signed)
Patient ID: [name], male   DOB: 04/26/1961, 62 y.o.   MRN: [id number]     Advanced Heart Failure Rounding Note  PCP-Cardiologist: Kristeen Miss, MD   Subjective:    9/13: OR for Bentall/AVR, TV repair, re-implantation of SVG-RCA.  Post-op vasoplegia and pulmonary edema, required central VA ECMO (RA drainage, ascending aorta return.  Post-op TEE EF 55%, mild-moderate RV hypokinesis.  9/14: Blender added to circuit due to high PaO2 9/16: TEE with EF 30%, moderate LVH, moderate RV dysfunction with mild enlargement, stable bioprosthetic aortic valve, repaired TV with mild TR, no endocarditis noted.  9/18: Bronchoscopy with mucus plugs and clots. 2023/06/20: To OR for mediastinal washout.  By TEE, EF 50% with moderate LVH, mild RV enlargement and mild RV dysfunction, stable bioprosthetic aortic valve, repaired TV with mild TR.  He did not tolerate lowering of ECMO flows even with increasing of pressors due to vasoplegia.  9/21: Bronchoscopy  BAL: growing candida and mold 9/24: Decannulated from [facility] ECMO. Chest closed. Transitioned to VV ECMO 10/1: Trach  - Lactic acid continues to improve slowly.  - Hemoptysis overnight / this morning.  - Rising pressor requirement. - Negative 100cc over the past 24h.    Objective:   Weight Range: 72.6 kg Body mass index is 25.83 kg/m.   Vital Signs:   Temp:  [98.2 F (36.8 C)-98.8 F (37.1 C)] 98.8 F (37.1 C) (10/04 0400) Pulse Rate:  [67-114] 114 (10/04 0745) Resp:  [0-32] 20 (10/04 0745) BP: (105-133)/(58-73) 122/58 (10/04 0701) SpO2:  [96 %-100 %] 100 % (10/04 0748) Arterial Line BP: (53-149)/(27-76) 139/72 (10/04 0745) FiO2 (%):  [50 %-100 %] 100 % (10/04 0748) Weight:  [72.6 kg] 72.6 kg (10/04 0500) Last BM Date : 06/09/23  Weight change: Filed Weights   06/08/23 0500 06/09/23 0458 06/15/2023 0500  Weight: 76.1 kg 73.4 kg 72.6 kg    Intake/Output:   Intake/Output Summary (Last 24 hours) at 06/07/2023 0754 Last data filed at 06/18/2023  0700 Gross per 24 hour  Intake 2730.22 ml  Output 2793.5 ml  Net -63.28 ml      Physical Exam    General:  critically ill; on VV ECMO.  HEENT: + trach Neck: + VV ECMO cannula; no hematoma or bleeding, L internal jugular HD cath Cor: PMI nondisplaced. Tachycardic this AM. No rubs, gallops or murmurs. Lungs: coarse; decreased bilaterally.  Abdomen: soft, nontender, nondistended.  Extremities: no cyanosis, clubbing, rash, edema Neuro: Lightly sedated.    Telemetry   Junctional rhythm 70s w/ PVCsSinus tachcyardia Labs    CBC Recent Labs    06/09/23 1654 06/09/23 2002 06/16/2023 0348 06/29/2023 0402  WBC 43.3*  --  46.3*  --   HGB 9.8*   < > 9.2* 9.9*  HCT 28.3*   < > 27.4* 29.0*  MCV 89.3  --  88.7  --   PLT 24*  --  23*  --    < > = values in this interval not displayed.   Basic Metabolic Panel Recent Labs    46/96/29 0355 06/09/23 0402 06/09/23 1654 06/09/23 2002 06/26/2023 0348 06/18/2023 0402  NA 134*   < > 135   < > 134* 137  K 4.1   < > 4.1   < > 3.7 3.6  CL 98  --  99  --  98  --   CO2 20*  --  22  --  23  --   GLUCOSE 248*  --  283*  --  170*  --   BUN 51*  --  51*  --  50*  --   CREATININE 1.15  --  1.12  --  1.06  --   CALCIUM 8.5*  --  8.4*  --  8.3*  --   MG 2.6*  --   --   --   --   --   PHOS 3.1  --  3.2  --  3.1  --    < > = values in this interval not displayed.   Liver Function Tests Recent Labs    06/09/23 0355 06/09/23 1654 06/15/2023 0348  AST 100*  --  97*  ALT 12  --  15  ALKPHOS 129*  --  136*  BILITOT 4.0*  --  3.4*  PROT 8.0  --  7.6  ALBUMIN 2.2* 2.4* 2.4*  2.4*   BNP: BNP (last 3 results) Recent Labs    23-May-2023 1100  BNP 2,097.0*     Imaging    No results found.   Medications:     Scheduled Medications:  sodium chloride   Intravenous Once   sodium chloride   Intravenous Once   sodium chloride   Intravenous Once   artificial tears   Both Eyes Q8H   bisacodyl  10 mg Rectal Daily   Chlorhexidine Gluconate  Cloth  6 each Topical Daily   darbepoetin (ARANESP) injection - DIALYSIS  40 mcg Subcutaneous Q Wed-1800   insulin aspart  0-20 Units Subcutaneous Q4H   insulin aspart  5 Units Subcutaneous Q4H   methylPREDNISolone (SOLU-MEDROL) injection  40 mg Intravenous Q24H   Followed by   [name] ON 06/17/2023] methylPREDNISolone (SOLU-MEDROL) injection  20 mg Intravenous Q24H   mouth rinse  15 mL Mouth Rinse Q2H   pantoprazole (PROTONIX) IV  40 mg Intravenous QHS   sodium chloride flush  3 mL Intravenous Q12H   tranexamic acid  500 mg Nebulization Q8H    Infusions:   prismasol BGK 4/2.5 400 mL/hr at 06/09/23 1934    prismasol BGK 4/2.5 400 mL/hr at 06/09/23 2137   sodium chloride Stopped (05/24/23 0032)   sodium chloride Stopped (06/06/23 0237)   albumin human Stopped (05/30/23 0819)   anticoagulant sodium citrate     bivalirudin (ANGIOMAX) 250 mg in sodium chloride 0.9 % 500 mL (0.5 mg/mL) infusion 0.016 mg/kg/hr (06/27/2023 0700)    ceFAZolin (ANCEF) IV Stopped (06/09/23 2241)   epinephrine Stopped (06/07/23 2327)   micafungin (MYCAMINE) 200 mg in sodium chloride 0.9 % 100 mL IVPB Stopped (06/09/23 1853)   norepinephrine (LEVOPHED) Adult infusion 6 mcg/min (06/22/2023 0700)   prismasol BGK 4/2.5 1,500 mL/hr at 06/26/2023 0528   TPN ADULT (ION) 75 mL/hr at 06/28/2023 0700   vasopressin 0.03 Units/min (06/24/2023 0701)    PRN Medications: sodium chloride, sodium chloride, acetaminophen, acetaminophen, albumin human, anticoagulant sodium citrate, artificial tears, dextrose, diphenhydrAMINE **OR** diphenhydrAMINE, HYDROmorphone (DILAUDID) injection, meperidine (DEMEROL) injection, midazolam, mouth rinse, oxidized cellulose, sodium chloride flush    Assessment/Plan   1. Post-cardiotomy vasoplegia/shock with failure to wean from CPB: Now on central VA ECMO (05/08/2023) with RA drainage/ascending aorta return. Post-op echo with EF 55%, mild-moderate RV dysfunction. Unable to get images yesterday by TTE.  TEE  on 9/16 showed EF 30%, moderate LVH, moderate RV dysfunction with mild enlargement, stable bioprosthetic aortic valve, repaired TV with mild TR, no endocarditis noted.  To OR for mediastinal washout on 9/19.  By TEE 9/19, EF 50% with moderate LVH, mild RV enlargement and  mild RV dysfunction, stable bioprosthetic aortic valve, repaired TV with mild TR.  He did not tolerate lowering of ECMO flows even with increasing of pressors due to vasoplegia. CXR with clearing left lung, right lung with persistent airspace disease not clearing with CVVH. Switch from [place] ECMO to VV ECMO with chest closure on 06/05/2023 - Overnight patient with hemoptysis likely secondary to thrombocytopenia and severe underlying lung injury. Will plan on cryoablation this AM. Holding all anticoagulation.  - Back on sedation; levophed at while sedated. Will attempt to wean off after cryo.  - Running even on CRRT - Prognosis remains guarded.   2. Acute hypoxic respiratory failure, post-op: -Blender added to keep PaO2 down. Sweep 4. Suspect extensive PNA, on CVVH for lung clearing  - Continue VV ECMO  - BAL from 9/20 + candida and Aspergillus terreus - On micafungin. Now off amphotericin.  - CT chest with extensive infiltrates. - CXR stable.  - Now s/p trach - On trial of steroids  3. Severe prosthetic AI:  - Due to previous MSSA endocarditis with partial valve dehiscence s/p re-do AVR/Bentall with reimplantation of SVG-RCA and TV repair 05/19/2023.    4. CAD s/p CABG/AVR 2017:   - s/p AVR/Bentall and re-implantation of SVG-RCA 06/03/2023. - Off ASA with platelets down.   5. ESRD:  - CVVH as above. Keep negative  6. DM2:  - management per CCM  7. Heart block:  - Post-op, now in stable junctional rhythm.    8. H/o CVA:  - Prior cerebellar CVA.   9. Thrombocytopenia:  - monitoring. Suspect inflammatory and hemolysis.  - transfusing platelets today.   10. Acute blood loss anemia:  - Post-op, transfuse hgb < 8.   12.  FEN: Receiving TPN  13. Liver failure, post-op - stable    Length of Stay: 23  Vic Esco, DO  06/23/2023, 7:54 AM  Advanced Heart Failure Team Pager [phone] (M-F; 7a - 5p)  Please contact [facility] for night-coverage after hours (5p -7a ) and weekends on [email or web address]   CRITICAL CARE Performed by: Dorthula Nettles   Total critical care time: 35 minutes  Critical care time was exclusive of separately billable procedures and treating other patients.  Critical care was necessary to treat or prevent imminent or life-threatening deterioration.  Critical care was time spent personally by me on the following activities: development of treatment plan with patient and/or surrogate as well as nursing, discussions with consultants, evaluation of patient's response to treatment, examination of patient, obtaining history from patient or surrogate, ordering and performing treatments and interventions, ordering and review of laboratory studies, ordering and review of radiographic studies, pulse oximetry and re-evaluation of patient's condition.

## 2023-06-10 NOTE — Progress Notes (Signed)
ANTICOAGULATION CONSULT NOTE - Follow up Consult  Pharmacy Consult for bivalirudin Indication:  VV ECMO  Allergies  Allergen Reactions   Other Anaphylaxis    Mushrooms  Not listed on MAR    Plavix [Clopidogrel Bisulfate] Other (See Comments)    TTP Not listed on the [facility]   Fleet Enema [Enema] Other (See Comments)    Unknown reaction   Lovenox [Enoxaparin] Other (See Comments)    Unknown reaction   Morphine Other (See Comments)    Unknown reaction   Nsaids Other (See Comments)    Unknown reaction   Hydrocodone-Acetaminophen Itching    Patient Measurements: Height: 5\' 6"  (167.6 cm) Weight: 72.6 kg (160 lb 0.9 oz) IBW/kg (Calculated) : 63.8 Heparin Dosing Weight: 81.3 kg  Vital Signs: Temp: 98.4 F (36.9 C) (10/04 1057) Temp Source: Oral (10/04 0800) BP: 84/52 (10/04 1413) Pulse Rate: 81 (10/04 1600)  Labs: Recent Labs    06/08/23 0424 06/08/23 0432 06/09/23 0355 06/09/23 0402 06/09/23 1654 06/09/23 2002 06/22/2023 0348 07/02/2023 0402 06/08/2023 1040 06/24/2023 1042 06/30/2023 1214  HGB 7.7*   < > 10.1*   < > 9.8*   < > 9.2*   < > 8.9* 9.9* 9.9*  HCT 23.3*   < > 28.6*   < > 28.3*   < > 27.4*   < > 26.5* 29.0* 29.0*  PLT 66*   < > 30*  --  24*  --  23*  --  63*  --   --   APTT 47*   < > 46*  --  46*  --  45*  --   --   --   --   LABPROT 18.5*  --  18.9*  --   --   --  18.3*  --   --   --   --   INR 1.5*  --  1.6*  --   --   --  1.5*  --   --   --   --   CREATININE 1.16   < > 1.15  --  1.12  --  1.06  --   --   --   --    < > = values in this interval not displayed.    Estimated Creatinine Clearance: 64.4 mL/min (by C-G formula based on SCr of 1.06 mg/dL).   Assessment: 62 yom underwent Bentall/AVR, TV repair, re-implantation of SVG-RCA complicated with vasoplegia and pulmonary edema requiring central VA ECMO. On 9/24 patient underwent transition VA to VV ECMO.   aPTT is slightly subtherapeutic at 45 on bivalirudin@0 .0.16mg /kg/hr. Hgb 9s stable plt remains low 23  LDH lower 900s Fibrinogen trend down 400s. Small amount of fibrin - stable. Bivailurdin turned off this am with blood tinged respiratory secretions and blood and blood clots removed from BAL, treated with TXA Continue to hold bivalirudin     Goal of Therapy:  aPTT 50-70 seconds Monitor platelets by anticoagulation protocol: Yes   Plan:  Hold bivalirudin for now Reassess on rounds 10/5   Leota Sauers Pharm.D. CPP, BCPS Clinical Pharmacist [phone] 06/17/2023 4:09 PM   Please check AMION for all [facility] Pharm[facility] phone numbers After 10:00 PM, call Main Pharmacy [phone]

## 2023-06-10 NOTE — Progress Notes (Signed)
NAME:  [name], MRN:  [id number], DOB:  03-05-1961, LOS: 23 ADMISSION DATE:  06/08/2023, CONSULTATION DATE:  9/11 REFERRING MD:  Dr. Izora Ribas, CHIEF COMPLAINT:  aortic valve dehiscence   History of Present Illness:  Patient is [name] w/ pertinent PMH CAD s/p CABG 2017 w/ AVR, HLD, HTN, prior CVA, T2DM, CKD4 was previously on HD presents to [facility] on June 11, 2023 chest pain.  Patient recently admitted to [facility] on 5/25 w/ AKI and AMS. While in ED patient had PEA arrest w/ ROSC in about 8 minutes. Patient required dialysis on this admission. Also w/ MSSA bacteremia associated w/ tricuspid valve endocarditis. Patient transferred to [facility] on 5/25. Treated w/ 6 week course oxacillin and rifampin. This admission also suffered a R cerebellar CVA w/ MRI likely septic emboli and had cholangitis/pancreatitis requiring ERCP.  On 06/22/2023 patient admitted to [facility] w/ chest pain, dyspnea, and BLE edema. BNP 2,097. CXR w/ pulm edema. Patient started on IV lasix infusion. Cards consulted. On 9/11 patient transferred to [facility]. Patient's echo showing possible aortic valve dehiscence. Cultures repeated and started on rocephin. Patient transferred to ICU for TEE and general anesthesia and to start CRRT. PCCM consulted.  Pertinent  Medical History   Past Medical History:  Diagnosis Date   Allergy    Anemia    Anxiety    Baker's cyst of knee    Blood transfusion without reported diagnosis    as baby    Coronary artery disease    quadruple bypass - March 2016   GERD (gastroesophageal reflux disease)    Gouty arthritis    "real bad" (01/17/2013)   Heart murmur    Hypercholesteremia    Hypertension    MSSA bacteremia 01/29/2023   Myocardial infarction (HCC) 2017   PEA (Pulseless electrical activity) (HCC) 01/29/2023   Stroke (HCC)    Type II diabetes mellitus (HCC)     Significant Hospital Events: Including procedures, antibiotic start and stop dates in addition to other pertinent events    06/27/2023 admitted 9/11 echo showing aortic valve dehiscence, confirmed by TEE.  9/11 started (back) on CRRT for volume overload and known CKD 4 9/12 breathing better after CRRT 9/13 to OR for Redo of aortic valve, tricuspid valve repair and ascending aortic root replacement w/ re-implantation of SVG to RCA. Post op TEE EF 55% RV mid-mod HK, AVR/TV repair stable. Could not come off CPB. Worse pulmonary edema. High dose pressors. Cannulated and started on VA ECMO w/ central cannulation  9/16 MDT ECMO rounds this AM, plans to remove more volume, 1 U PRBCs  9/18 bronch, clot and mucus plugs removed  9/19 OR for washout  9/20 bronch 9/21 bronch 9/24 VA to VV, some bleeding and acidemia issues postop 9/25 ett exchange and bronch 10/1 trach  Interim History / Subjective:  Coughing fit this am followed by hemoptysis and worsening airway pressures.  Bronch showing left mainstem occlusion from blood clot.  Objective   Blood pressure (!) 122/58, pulse 94, temperature 98.8 F (37.1 C), temperature source Core, resp. rate (!) 32, height 5\' 6"  (1.676 m), weight 72.6 kg, SpO2 100%. CVP:  [0 mmHg-54 mmHg] 8 mmHg  Vent Mode: SIMV;PRVC;PSV FiO2 (%):  [50 %-100 %] 100 % Set Rate:  [20 bmp] 20 bmp Vt Set:  [370 mL] 370 mL PEEP:  [5 cmH20] 5 cmH20 Pressure Support:  [30 cmH20] 30 cmH20 Plateau Pressure:  [22 cmH20-36 cmH20] 36 cmH20   Intake/Output Summary (Last 24  hours) at 06/17/2023 0751 Last data filed at 06/28/2023 0700 Gross per 24 hour  Intake 2730.22 ml  Output 2793.5 ml  Net -63.28 ml   Filed Weights   06/08/23 0500 06/09/23 0458 06/21/2023 0500  Weight: 76.1 kg 73.4 kg 72.6 kg    Examination: Nods head to command Profoundly weak Sternotomy looks okay Trach with bright bloody secretions Abd soft hypoactive BS ECMO circuit looks okay Lungs near absent breath sounds left R sounds okay Plats 41 (from 28 yesterday)  ABG ok pre hemoptysis on 4LPM sweep Plts stable low H/H ok Sugars a  litte better CXR worsened LLL aeration Stable leukocytosis in 40s  Assessment & Plan:  Post cardiotomy vasoplegia/cardioplegia w/ failure to wean from CPB- improved; 9/24 converted to VV Acute on chronic combined HF, Hx MSSA endocarditis- S/p AVR/Bentall and reimplantation of Coronaries and TV repair 9/13 Acute respiratory failure w/ hypoxia due to volume overload, with untreated OSA, and dense nonresolving R infiltrate growing aspergillus terreus CKD 4. Had recently been on HD- now on CRRT Circuit related hemolysis, thrombocytopenia consumptive- stable Hyperbilirubinemia TF intolerance- ongoing issue now on TPN CAD s/p CABG in 2017 w/ AVR HTN HLD Hx of CVA DMT2 Leukocytosis, multifactorial   - On ancef for presumed lingering IE - Micafungin for a. Terreus, duration per ID - Prednisone 40 x 1 week then 20 x 1 week - New hemoptysis- left mainstem occluded; will use cryo/forceps and clear out then get fresh culture data; TXA nebs and 1 x plts - Hold bival for now - Sweep for pH 7.35-7.45 - Vasopressin, levo for MAP 65; low threshold for transfusion if levo needs go up - Continue TPN while we await return of bowel function; still not really doing great in this regard, question intermittent interstinal ischemia - Sedation PRN - No role for further CT scans unless it is to help GOC discussions - Guarded prognosis - Discussed above with AHF+TCTS+ECMO specialists   Best Practice (right click and "Reselect all SmartList Selections" daily)   Diet/type: TPN DVT prophylaxis: bival GI prophylaxis: PPI Lines: Central line, Dialysis Catheter, and Arterial Line Foley: yes Code Status:  full code Last date of multidisciplinary goals of care discussion [per TCTS and AHF]   This patient is critically ill with multiple organ system failure which requires frequent high complexity decision making, assessment, support, evaluation, and titration of therapies. This was completed through the  application of advanced monitoring technologies and extensive interpretation of multiple databases. During this encounter critical care time was devoted to patient care services described in this note for 32 minutes.  Lorin Glass, MD 07/05/2023 7:51 AM [facility]  For contact information, see Amion. If no response to pager, please call PCCM consult pager. After hours, 7PM- 7AM, please call [facility].

## 2023-06-10 NOTE — Procedures (Signed)
Bronchoscopy Procedure Note  [name]  [id number]  1961/02/20  Date:07/03/2023  Time:9:02 AM   Provider Performing:Yahshua Thibault C Katrinka Blazing   Procedure(s):  Subsequent Therapeutic Aspiration of Tracheobronchial Tree [phone]); foreign body removal ([id number])  Indication(s) Occluded left mainstem  Consent Part of ECMO consent  Anesthesia In place for mechanical ventilation   Time Out Verified patient identification, verified procedure, site/side was marked, verified correct patient position, special equipment/implants available, medications/allergies/relevant history reviewed, required imaging and test results available.   Sterile Technique Usual hand hygiene, masks, gowns, and gloves were used   Procedure Description Bronchoscope advanced through tracheostomy tube and into airway. Tenacious inspissated clot occluding left mainstem removed using combination forceps and cryoprobe.  Able to open up all segments of LUL/lingula.  LLL able to open 4/5. Inspissated clot could not be removed from final segment.   Complications/Tolerance None; patient tolerated the procedure well. Chest X-ray is not needed post procedure.   EBL Minimal   Specimen(s) None

## 2023-06-10 NOTE — Progress Notes (Signed)
02-Jul-2023 PEEP/Plat looking better after extensive clot retrieval from left mainstem. Then slowly worsened even with heavier sedation. Repeat look w/ bronch: left filled back up with blood. Discussed with Virgia Land, Bartle: let rest tonight; IV TXA x 1, check TEG, repeat therapeutic bronch tomorrow to clear out left. If fills back up with blood we are at end of life given everything else going on (nonfunctional gut, nonresolving pneumonia, profound muscular deconditioning, persistent shock). Discussed this with son at length in conference room. He is in agreement. Family meeting 9am Sunday if cannot clear out left lung tomorrow. Vent changed to PC.  Additional 70 mins cc time Myrla Halsted MD PCCM

## 2023-06-10 NOTE — Progress Notes (Signed)
PHARMACY - TOTAL PARENTERAL NUTRITION CONSULT NOTE   Indication:  unable to tolerate tube feeding  Patient Measurements: Height: 5\' 6"  (167.6 cm) Weight: 72.6 kg (160 lb 0.9 oz) IBW/kg (Calculated) : 63.8 TPN AdjBW (KG): 69 Body mass index is 25.83 kg/m. Usual Weight: ~90kg  Assessment: 62 yo male with significant PMH for HLD, HTN, T2DM, CKD4 (prev on HD) and recent MSSA bacteremia + endocarditis (May 2024). Back in May 2024 admission, underwent ERCP for cholangitis/pancreatitis. Admitted on 9/11 w/ chest pain found to have aortic valve dehiscence. S/p aortic valve replacement redo on 9/13 - developed pulmonary edema post op and was placed on [place] ECMO, transitioned to VV ECMO 06-01-2023. S/p mediastinal washout on 9/19 - chest still open. Unable to speak w/ patient due to intubation (utilizing Precedex for sedation). Pharmacy consulted to manage TPN for inability to tolerate tube feeding and malnutrition.   Glucose / Insulin: hx T2DM - A1c 6.1%, on Farxiga PTA. BG 275-175 (trending down). Used 74 units rSSI in 24 hours. Received 25 units Detemir + 50 units insulin regular in bag -10/1 started steroid course, plan for 2 week taper starting 10/4 Electrolytes: K 3.5 (received 20 mEq 10/3), Mg 2.6 trending up (none in TPN), CoCa 9.58/iCa 1.12, others wnl Renal: Hx CKD4 on CRRT since 9/11 (required HD in the past), 4K bags, systemic bivalirudin for anticoag - not using systemic sodium citrate infusion, only for catheter lock per d/w RN at bedside on 01-Jun-2023; BUN 50 Hepatic: Alk phos 136, AST 97, ALT wnl, Tbili 4, albumin 2.2, TG 188  Intake / Output:  Stool 170 mL, Chest tube 0 mL, CRRT removed 2623.5 mL (keeping even) GI Imaging:  9/27 CT A/P : mild wall thickening in several small bowel loops suggesting enteritis, cholelithiasis GI Surgeries / Procedures:  06/01/2023 transition to VV ECMO 9/20 Cortrak placed  9/22 NGT placed  Central access: 9/13 TPN start date: 9/23  Nutritional Goals: Goal  concentrated TPN rate is 75 mL/hr (provides 140 g of protein and 2201 kcals per day)  RD Assessment: Estimated Needs Total Energy Estimated Needs: 2200-2400 Total Protein Estimated Needs: 130-150 grams Total Fluid Estimated Needs: 1L plus UOP  Current Nutrition:  NPO and TPN  Plan:  Continue concentrated TPN at 75 mL/hr at 1800 - meeting 100% of needs Electrolytes in TPN: Na 125 mEq/L, increase K 10 mEq/L, Ca 0 mEq/L, Mg 0 mEq/L, and Phos 20 mmol/L, ZO:XWRU to max acet  Standard MVI and trace elements within TPN No chromium (on CRRT and currently on shortage) Decrease to moderate q8h SSI, Stop aspart 5 units q4h  Decrease to 45 units insulin regular in TPN. Will adjust considering steroid taper  Continue M[facility]F at [facility] Monitor TPN labs on Mon/Thurs, RFP BID while on CRRT   F/u cortrak placement and TF restart   Laqueta Jean PharmD Candidate 07/03/2023 12:00 PM  Please check AMION for all [facility] Pharmacy phone numbers After 10:00 PM, call Main Pharmacy [phone]

## 2023-06-10 NOTE — Procedures (Signed)
Bronchoscopy Procedure Note  Hawk Mones  [id number]  1961/06/14  Date:06/14/2023  Time:7:49 AM   Provider Performing:Taiten Brawn C Katrinka Blazing   Procedure(s):  Subsequent Therapeutic Aspiration of Tracheobronchial Tree [phone])  Indication(s) Hemoptysis, worsening plateau pressures  Consent Unable to obtain consent due to emergent nature of procedure.  Anesthesia Roc/etomidate   Time Out Verified patient identification, verified procedure, site/side was marked, verified correct patient position, special equipment/implants available, medications/allergies/relevant history reviewed, required imaging and test results available.   Sterile Technique Usual hand hygiene, masks, gowns, and gloves were used   Procedure Description Bronchoscope advanced through tracheostomy tube and into airway.  Airways were examined down to subsegmental level with findings noted below.     Findings:  - Small thick mucoid secretions partiallly occlusive on R - Thick inspissated clot occluding L mainstem, partially cleared, further removal will require therapeutic bronch, endo team notified   Complications/Tolerance None; patient tolerated the procedure well. Chest X-ray is not needed post procedure.   EBL Minimal   Specimen(s) None

## 2023-06-10 NOTE — Progress Notes (Signed)
  Thrombi paid remains in place under trach flange & on the top right. The thrombi pad on the top left off during bath. There is not any new bleeding around trach site.

## 2023-06-10 NOTE — Procedures (Signed)
Bedside Bronchoscopy Procedure Note [name] [id number] 29-Dec-1960  Procedure: Bronchoscopy Indications: Diagnostic evaluation of the airways, Obtain specimens for culture and/or other diagnostic studies, and Remove secretions  Procedure Details: ET Tube Size: ET Tube secured at lip (cm): Bite block in place: Yes In preparation for procedure, Patient hyper-oxygenated with 100 % FiO2 Airway entered and the following bronchi were examined: RUL, RML, RLL, LUL, LLL, and Bronchi.   Bronchoscope removed.    Evaluation BP 108/62   Pulse 88   Temp 98.4 F (36.9 C)   Resp 14   Ht 5\' 6"  (1.676 m)   Wt 72.6 kg   SpO2 98%   BMI 25.83 kg/m  Breath Sounds:Diminished and Rhonch O2 sats: stable throughout Patient's Current Condition: stable Specimens:  Copious amounts of blood with blood clots. Endo called by Dr. Katrinka Blazing for bronch to be brought to the bedside.  Complications: No apparent complications Patient did tolerate procedure well.   [name], Margaretmary Dys 06/24/2023, 12:21 PM

## 2023-06-10 NOTE — Progress Notes (Addendum)
Patient ID: [name], male   DOB: 15-Nov-1960, 62 y.o.   MRN: [id number]   Extracorporeal support note   ECLS support day: 10 (V-V) (Day 22 of ECLS support) Indication: Acute hypoxic respiratory failure  Configuration: Crescent VV ECMO  30FR Dual-lumen Crescent cannula in RIJ   Pump speed: 3650rpm  Pump flow: 3.75 L/min Pump used: Cardiohelp  Sweep gas: 7  ABG: 7.36/44/168 LDH: 1100  Circuit check: small clot in oxy Anticoagulant: Holding anticoagulation Anticoagulation targets:  N/A  Changes in support: none  Anticipated goals/duration of support:  - Hemoptysis today; holding anticoagulation. Plan for cryoablation/TXA.    Pura Picinich DO  06/14/2023, 7:50 AM

## 2023-06-10 NOTE — Progress Notes (Signed)
Daily Progress Note   Patient Name: [name]       Date: 06-21-23 DOB: Oct 23, 1960  Age: 62 y.o. MRN#: [id number] Attending Physician: Alleen Borne, MD Primary Care Physician: Marcine Matar, MD Admit Date: 05/25/2023  Reason for Consultation/Follow-up: Patient is on ECMO  Length of Stay: 23  Current Medications: Scheduled Meds:   sodium chloride   Intravenous Once   sodium chloride   Intravenous Once   artificial tears   Both Eyes Q8H   bisacodyl  10 mg Rectal Daily   Chlorhexidine Gluconate Cloth  6 each Topical Daily   darbepoetin (ARANESP) injection - DIALYSIS  40 mcg Subcutaneous Q Wed-1800    HYDROmorphone (DILAUDID) injection  0.5 mg Intravenous Once   insulin aspart  0-15 Units Subcutaneous Q8H   methylPREDNISolone (SOLU-MEDROL) injection  40 mg Intravenous Q24H   Followed by   [name] ON 06/17/2023] methylPREDNISolone (SOLU-MEDROL) injection  20 mg Intravenous Q24H   mouth rinse  15 mL Mouth Rinse Q2H   pantoprazole (PROTONIX) IV  40 mg Intravenous QHS   sodium chloride flush  3 mL Intravenous Q12H   tranexamic acid  500 mg Nebulization Q8H    Continuous Infusions:   prismasol BGK 4/2.5 400 mL/hr at June 21, 2023 0758    prismasol BGK 4/2.5 400 mL/hr at 06/21/23 1006   sodium chloride Stopped (05/24/23 0032)   sodium chloride Stopped (06/06/23 0237)   albumin human Stopped (05/30/23 0819)   anticoagulant sodium citrate      ceFAZolin (ANCEF) IV Stopped (21-Jun-2023 1130)   epinephrine Stopped (06/07/23 2327)   HYDROmorphone 1.5 mg/hr (06/21/23 1500)   micafungin (MYCAMINE) 200 mg in sodium chloride 0.9 % 100 mL IVPB Stopped (06/09/23 1853)   norepinephrine (LEVOPHED) Adult infusion 23 mcg/min (June 21, 2023 1518)   prismasol BGK 4/2.5 1,500 mL/hr at 06/21/23 1513   TPN  ADULT (ION) 75 mL/hr at 06/21/2023 1500   TPN ADULT (ION)     vasopressin 0.03 Units/min (06-21-23 1500)    PRN Meds: sodium chloride, sodium chloride, acetaminophen, acetaminophen, albumin human, anticoagulant sodium citrate, artificial tears, dextrose, diphenhydrAMINE **OR** diphenhydrAMINE, HYDROmorphone, HYDROmorphone (DILAUDID) injection, meperidine (DEMEROL) injection, midazolam, mouth rinse, oxidized cellulose, sodium chloride flush  Physical Exam Constitutional:      Appearance: He is ill-appearing.     Interventions: He is intubated.     Comments:  ECMO  Pulmonary:     Effort: He is intubated.             Vital Signs: BP (!) 84/52   Pulse 88   Temp 98.4 F (36.9 C)   Resp 11   Ht 5\' 6"  (1.676 m)   Wt 72.6 kg   SpO2 97%   BMI 25.83 kg/m  SpO2: SpO2: 97 % O2 Device: O2 Device: Ventilator O2 Flow Rate: O2 Flow Rate (L/min): 4 L/min   Patient Active Problem List   Diagnosis Date Noted   Cardiogenic shock (HCC) 06/01/2023   Protein-calorie malnutrition, severe 05/28/2023   Cardiogenic postoperative shock (HCC) 05/30/2023   Acute on chronic combined systolic and diastolic CHF (congestive heart failure) (HCC) 05/18/2023   Prosthetic aortic valve failure 05/18/2023   Acute respiratory failure with hypoxia (HCC) 05/18/2023   Anemia 05/18/2023   Volume overload 05-27-23   Acute on chronic congestive heart failure (HCC) 2023/05/27   ESRD (end stage renal disease) on dialysis (HCC) 04/11/2023   Endocarditis of tricuspid valve 03/30/2023   Medication management 03/30/2023   Symptomatic anemia 02/26/2023   Allergy, unspecified, initial encounter 02/25/2023   Anaphylactic shock, unspecified, initial encounter 02/25/2023   Dependence on renal dialysis (HCC) 02/15/2023   Chronic kidney disease, stage 3 unspecified (HCC) 02/15/2023   Hypertensive chronic kidney disease with stage 1 through stage 4 chronic kidney disease, or unspecified chronic kidney disease 02/15/2023    Personal history of transient ischemic attack (TIA), and cerebral infarction without residual deficits 02/15/2023   Sepsis (HCC) 01/29/2023   TTP (thrombotic thrombocytopenic purpura) (HCC) 01/29/2023   MSSA bacteremia 01/29/2023   PEA (Pulseless electrical activity) (HCC) 01/29/2023   Severe sepsis with septic shock (HCC) 01/29/2023   Prosthetic valve endocarditis (HCC) 01/29/2023   Influenza vaccine refused 10/09/2020   OSA on CPAP 02/06/2019   Macroalbuminuric diabetic nephropathy (HCC) 02/06/2019   TIA (transient ischemic attack) 04/10/2018   Colon cancer screening 05/04/2017   Cerebral infarction (HCC) 12/11/2015   Acute ischemic VBA thalamic stroke (HCC)    Atherosclerosis of native coronary artery of native heart without angina pectoris    H/O heart bypass surgery    Benign essential HTN    Type 2 diabetes mellitus without complication, without long-term current use of insulin (HCC)    S/P AVR 11/25/2015   CAD (coronary artery disease)    Chronic periodontitis 11/21/2015   Bicuspid aortic valve    Controlled type 2 diabetes mellitus with diabetic nephropathy, without long-term current use of insulin (HCC)    Essential hypertension 02/24/2015   Dyslipidemia 02/24/2015   GERD (gastroesophageal reflux disease) 05/02/2013   Primary gout 05/02/2013   Allergic rhinitis due to allergen 07/09/2010   Class 1 obesity 07/08/2008    Palliative Care Assessment & Plan   Patient Profile: 62 y.o. male  with past medical history of CAD s/p CABG 2017 w/ AVR (magna ease for bicuspid valve disease), HLD, HTN, prior CVA, T2DM, CKD4 was previously on HD admitted to [facility] on 2023/05/27 for evaluation of chest pain.    He was found to have an aortic valve dehiscence confirmed by TEE on 05/18/23. Started CRRT for volume overload that day. 05/24/2023 he went to OR for redo of aortic valve, tricuspid valve repair and ascending aortic root replacement with re-implantation of SVG to RCA; could not come off  CPB, cannulated and started on VA ECMO with central cannulation   Recent admission on 5/25 with AKI and AMS, had PEA arrest with  ROSC in 8 minutes, required dialysis, also with MSSA bacteremia associated with tricuspid valve endocarditis, suffered right cerebellar CVA and had cholangitis/pancreatitis requiring ERCP.   Significant Hospital events/per CCM   9/10 admitted 9/11 echo showing aortic valve dehiscence, confirmed by TEE.  9/11 started (back) on CRRT for volume overload and known CKD 4 9/12 breathing better after CRRT 9/13 to OR for Redo of aortic valve, tricuspid valve repair and ascending aortic root replacement w/ re-implantation of SVG to RCA. Post op TEE EF 55% RV mid-mod HK, AVR/TV repair stable. Could not come off CPB. Worse pulmonary edema. High dose pressors. Cannulated and started on VA ECMO w/ central cannulation  9/16 MDT ECMO rounds this AM, plans to remove more volume, 1 U PRBCs  9/18 bronch, clot and mucus plugs removed  9/19 OR for washout 9/20 bronch 9/21 bronch June 01, 2023 VA to VV, some bleeding and acidemia issues postop 9/25 ett exchange and bronch 10/1 trach 10/4 bronch  Subjective: Patient remains critically ill with multiorgan system failure. Family just left. Received update from nursing staff. Team had conversation with son regarding his worsening status. There is a family meeting planned for Sunday.  1550 Reached out to son [name]. We discuss how difficult the news today was for the family-- it was news [name] did not want to hear. Emotional support offered. We discussed that the patient and family has been through a lot during this hospitalization hoping for a meaningful recovery. Emotional support offered.   He is busy right now and requests to call PMT back at another time.  Recommendations/Plan: Full code Full scope Time for outcomes Encouraged continued discussion re: goals of care PMT support- as needed   Code Status:    Code Status Orders  (From  admission, onward)           Start     Ordered   05/21/23 0806  Full code  Continuous       Comments: Full code with ACLS Protocol WITH NO CHEST COMPRESSIONS  Question:  By:  Answer:  Consent: discussion documented in EHR   05/21/23 0806        Extensive chart review has been completed  including labs, vital signs, imaging, progress/consult notes, orders, medications and available advance directive documents.  Discussed with nursing staff at bedside   Care plan was discussed with bedside RN  Time spent: 25 minutes  Thank you for allowing the Palliative Medicine Team to assist in the care of this patient.   Sherryll Burger, NP  Please contact Palliative Medicine Team phone at [phone] for questions and concerns.

## 2023-06-10 NOTE — Progress Notes (Signed)
Patient ID: [name], male   DOB: 01/12/1961, 62 y.o.   MRN: [id number]   S:  Seen and examined on CRRT; procedure supervised.  He had 2.4 liters UF over 10/3 with CRRT charted thus far.  Goal UF has been keep even as tolerated.  He continues on ECMO.  Team has been monitoring for the need for platelet transfusion per nursing.  He has continued to consistently be more interactive than earlier this week.    Review of systems:  Unable to obtain secondary to intubated and obtunded     O:BP 105/65   Pulse 73   Temp 98.8 F (37.1 C) (Core)   Resp 20   Ht 5\' 6"  (1.676 m)   Wt 72.6 kg   SpO2 100%   BMI 25.83 kg/m   Intake/Output Summary (Last 24 hours) at 07/04/2023 0557 Last data filed at 06/29/2023 0500 Gross per 24 hour  Intake 2723.52 ml  Output 2795.9 ml  Net -72.38 ml   Intake/Output: I/O last 3 completed shifts: In: 4661.6 [I.V.:3293.7; Blood:531; Other:60; IV Piggyback:776.9] Out: 4662.3 [Stool:200]  Intake/Output this shift:  Total I/O In: 972.2 [I.V.:872.2; IV Piggyback:100] Out: 997.3  Weight change: -0.8 kg  General adult male in bed critically ill       HEENT normocephalic atraumatic  Neck supple trachea midline Lungs coarse mechanical breath sounds  Heart  On ECMO; S1S2  Abdomen soft nontender with limitation of obtunded; nondistended Extremities no pitting edema; decreased skin turgor of feet  Neuro - no continuous sedation GU no foley  Access right subclavian tunneled dialysis catheter in place    Recent Labs  Lab 06/04/23 0413 06/04/23 0843 06/05/23 0346 06/05/23 0347 06/06/23 0415 06/06/23 0646 06/07/23 0354 06/07/23 0727 06/07/23 1635 06/07/23 1824 06/08/23 0424 06/08/23 0432 06/08/23 1638 06/08/23 1952 06/09/23 0355 06/09/23 0402 06/09/23 0814 06/09/23 0934 06/09/23 1536 06/09/23 1654 06/09/23 2002 07/04/2023 0348 07/02/2023 0402  NA 135  136   < > 135  135   < > 134*   < > 134*   < > 133*   < > 133*   < > 134*  136   < > 134*   <  > 137 137 137 135 138 134* 137  K 4.4  4.3   < > 4.2  4.2   < > 4.1   < > 4.1   < > 4.6   < > 4.6   < > 4.0  4.1   < > 4.1   < > 3.3* 3.4* 3.8 4.1 3.6 3.7 3.6  CL 101   < > 99  98   < > 99   < > 98  --  98  --  100  --  100  --  98  --   --   --   --  99  --  98  --   CO2 23   < > 22  23   < > 22   < > 21*  --  21*  --  22  --  21*  --  20*  --   --   --   --  22  --  23  --   GLUCOSE 196*   < > 173*  171*   < > 200*   < > 176*  --  296*  --  231*  --  259*  --  248*  --   --   --   --  283*  --  170*  --   BUN 29*   < > 34*  34*   < > 37*   < > 37*  --  40*  --  45*  --  48*  --  51*  --   --   --   --  51*  --  50*  --   CREATININE 1.06   < > 1.13  1.11   < > 1.23   < > 1.11  --  1.18  --  1.16  --  1.18  --  1.15  --   --   --   --  1.12  --  1.06  --   ALBUMIN 2.0*  2.0*   < > 2.0*  2.0*   < > 2.0*   < > 2.0*  2.0*  --  2.1*  --  2.3*  2.2*  --  2.2*  --  2.2*  --   --   --   --  2.4*  --  2.4*  --   CALCIUM 8.2*   < > 8.6*  8.6*   < > 8.7*   < > 8.5*  --  8.3*  --  8.6*  --  8.6*  --  8.5*  --   --   --   --  8.4*  --  8.3*  --   PHOS 3.4   < > 3.1   < > 3.3   < > 3.5  --  4.6  --  3.9  --  4.0  --  3.1  --   --   --   --  3.2  --  3.1  --   AST 108*  --  116*  --  126*  --  109*  --   --   --  98*  --   --   --  100*  --   --   --   --   --   --   --   --   ALT 16  --  17  --  17  --  14  --   --   --  10  --   --   --  12  --   --   --   --   --   --   --   --    < > = values in this interval not displayed.   Liver Function Tests: Recent Labs  Lab 06/07/23 0354 06/07/23 1635 06/08/23 0424 06/08/23 1638 06/09/23 0355 06/09/23 1654 06/18/2023 0348  AST 109*  --  98*  --  100*  --   --   ALT 14  --  10  --  12  --   --   ALKPHOS 131*  --  135*  --  129*  --   --   BILITOT 4.0*  --  4.1*  --  4.0*  --   --   PROT 8.0  --  8.5*  --  8.0  --   --   ALBUMIN 2.0*  2.0*   < > 2.3*  2.2*   < > 2.2* 2.4* 2.4*   < > = values in this interval not displayed.   No results for  input(s): "LIPASE", "AMYLASE" in the last 168 hours. Recent Labs  Lab 06/03/23 0742 06/04/23 1229  AMMONIA 26 40*   CBC: Recent Labs  Lab 06/08/23 1137 06/08/23 1638 06/08/23 1952 06/09/23 0355 06/09/23 0402 06/09/23 1654  06/09/23 2002 06/15/2023 0348 06/24/2023 0402  WBC 51.1* 54.4*  --  43.3*  --  43.3*  --  46.3*  --   HGB 8.0* 7.9*  9.2*   < > 10.1*   < > 9.8* 9.9* 9.2* 9.9*  HCT 23.9* 23.1*  27.0*   < > 28.6*   < > 28.3* 29.0* 27.4* 29.0*  MCV 92.3 90.6  --  85.9  --  89.3  --  88.7  --   PLT 56* 47*  --  30*  --  24*  --  23*  --    < > = values in this interval not displayed.   Cardiac Enzymes: No results for input(s): "CKTOTAL", "CKMB", "CKMBINDEX", "TROPONINI" in the last 168 hours. CBG: Recent Labs  Lab 06/09/23 1201 06/09/23 1659 06/09/23 2000 06/09/23 2356 07/07/2023 0400  GLUCAP 262* 275* 244* 178* 175*    Iron Studies: No results for input(s): "IRON", "TIBC", "TRANSFERRIN", "FERRITIN" in the last 72 hours. Studies/Results: DG CHEST PORT 1 VIEW  Result Date: 06/09/2023 CLINICAL DATA:  ECMO. EXAM: PORTABLE CHEST 1 VIEW COMPARISON:  June 08, 2023. FINDINGS: Tracheostomy tube is unchanged. Nasogastric tube is seen entering stomach. Right internal jugular catheter is unchanged. Bilateral chest tubes are noted without pneumothorax. ECMO device is noted. Stable bilateral lung opacities are noted. IMPRESSION: Stable support apparatus.  Stable bilateral opacities. Electronically Signed   By: Lupita Raider M.D.   On: 06/09/2023 10:32   DG CHEST PORT 1 VIEW  Result Date: 06/08/2023 CLINICAL DATA:  Follow-up ECMO EXAM: PORTABLE CHEST 1 VIEW COMPARISON:  06/07/2023 FINDINGS: Tracheostomy remains in place. Nasogastric tube enters the stomach. Bilateral chest tubes remain in place. Right internal jugular central line tip in the SVC above the right atrium. ECMO device appears the same as seen yesterday. Left subclavian central line tip at the innominate SVC junction.  Widespread bilateral pulmonary opacity persists, denser on the right than the left. No new finding. IMPRESSION: No change since yesterday. Widespread bilateral pulmonary opacity, denser on the right than the left. ECMO device and other lines and tubes appears the same as seen yesterday. Electronically Signed   By: Paulina Fusi M.D.   On: 06/08/2023 10:18    sodium chloride   Intravenous Once   sodium chloride   Intravenous Once   artificial tears   Both Eyes Q8H   bisacodyl  10 mg Rectal Daily   Chlorhexidine Gluconate Cloth  6 each Topical Daily   darbepoetin (ARANESP) injection - DIALYSIS  40 mcg Subcutaneous Q Wed-1800   insulin aspart  0-20 Units Subcutaneous Q4H   insulin aspart  5 Units Subcutaneous Q4H   methylPREDNISolone (SOLU-MEDROL) injection  40 mg Intravenous Q24H   Followed by   [name] ON 06/17/2023] methylPREDNISolone (SOLU-MEDROL) injection  20 mg Intravenous Q24H   mouth rinse  15 mL Mouth Rinse Q2H   pantoprazole (PROTONIX) IV  40 mg Intravenous QHS   sodium chloride flush  3 mL Intravenous Q12H    BMET    Component Value Date/Time   NA 137 06/15/2023 0402   NA 143 11/02/2022 1454   K 3.6 06/21/2023 0402   CL 98 06/14/2023 0348   CO2 23 06/09/2023 0348   GLUCOSE 170 (H) 06/19/2023 0348   BUN 50 (H) 06/07/2023 0348   BUN 31 (H) 11/02/2022 1454   CREATININE 1.06 06/22/2023 0348   CREATININE 2.71 (H) 05-25-23 0950   CREATININE 1.05 02/24/2015 1601   CALCIUM 8.3 (L) 06/15/2023 0348  GFRNONAA >60 06/24/2023 0348   GFRNONAA 26 (L) 05/11/2023 0950   GFRNONAA 80 02/24/2015 1601   GFRAA 62 10/09/2020 1520   GFRAA >89 02/24/2015 1601   CBC    Component Value Date/Time   WBC 46.3 (H) 06/19/2023 0348   RBC 3.09 (L) 06/26/2023 0348   HGB 9.9 (L) 06/23/2023 0402   HGB 6.4 (LL) 05/27/2023 0852   HGB 10.2 (L) 08/12/2022 0934   HCT 29.0 (L) 07/03/2023 0402   HCT 29.2 (L) 08/12/2022 0934   PLT 23 (LL) 06/28/2023 0348   PLT 109 (L) 05/14/2023 0852   PLT 252  08/12/2022 0934   MCV 88.7 07/07/2023 0348   MCV 94 08/12/2022 0934   MCH 29.8 07/05/2023 0348   MCHC 33.6 06/30/2023 0348   RDW 17.3 (H) 06/15/2023 0348   RDW 15.5 (H) 08/12/2022 0934   LYMPHSABS 5.8 (H) 05/29/2023 1722   LYMPHSABS 1.9 11/04/2021 1028   MONOABS 6.3 (H) 05/29/2023 1722   EOSABS 0.0 05/29/2023 1722   EOSABS 0.2 11/04/2021 1028   BASOSABS 0.0 05/29/2023 1722   BASOSABS 0.1 11/04/2021 1028    Assessment/Plan:   Anuric AKI/dialysis dependent AKI with fluid overload: Started on RRT during previous admission in May 2024 until 05/10/23 (recovered). Patient is status post cardiac surgery on May 26, 2023 and currently on ECMO.  CRRT start 9/11. No signs of renal recovery.   UF is keep even as tolerated.  Please page nephrology for UF goal changes All 4K fluids On angiomax per team for anticoagulation.  Severe prosthetic AI due to previous MSSA of endocarditis with partial valve dehiscence, status post redo AVR and TV repair on 05/15/2023.  Per CTS Shock: multifactorial including cardiogenic and septic, on pressors and ECMO per primary team. Right lung consolidation. Profound leukocytosis; followed by ID, heart failure team   Candida albicans pneumonia - antifungals per ID  Acute hypoxic respiratory failure: on vent and s/p trach on 10/1; per primary service. ECMO. Fungal PNA. Abx/antifungal per primary service and ID   Anemia, thrombocytopenia: Transfuse as needed per primary team.  Started aranesp 40 mcg every Wednesday on 10/2 Hypophosphatemia - repletion prn  Disposition - in ICU on CRRT, ECMO.  Critically ill    Estanislado Emms, MD 6:10 AM 07/03/2023

## 2023-06-11 ENCOUNTER — Inpatient Hospital Stay (HOSPITAL_COMMUNITY): Payer: PPO | Admitting: Anesthesiology

## 2023-06-11 ENCOUNTER — Encounter (HOSPITAL_COMMUNITY): Admission: EM | Disposition: E | Payer: Self-pay | Source: Home / Self Care | Attending: Surgery

## 2023-06-11 ENCOUNTER — Inpatient Hospital Stay (HOSPITAL_COMMUNITY): Payer: PPO

## 2023-06-11 DIAGNOSIS — R570 Cardiogenic shock: Secondary | ICD-10-CM | POA: Diagnosis not present

## 2023-06-11 HISTORY — PX: HEMOSTASIS CONTROL: SHX6838

## 2023-06-11 HISTORY — PX: CRYOTHERAPY: SHX6894

## 2023-06-11 HISTORY — PX: VIDEO BRONCHOSCOPY: SHX5072

## 2023-06-11 LAB — RENAL FUNCTION PANEL
Albumin: 2.4 g/dL — ABNORMAL LOW (ref 3.5–5.0)
Albumin: 2.4 g/dL — ABNORMAL LOW (ref 3.5–5.0)
Anion gap: 14 (ref 5–15)
Anion gap: 13 (ref 5–15)
BUN: 54 mg/dL — ABNORMAL HIGH (ref 8–23)
BUN: 51 mg/dL — ABNORMAL HIGH (ref 8–23)
CO2: 24 mmol/L (ref 22–32)
CO2: 24 mmol/L (ref 22–32)
Calcium: 8.4 mg/dL — ABNORMAL LOW (ref 8.9–10.3)
Calcium: 8.1 mg/dL — ABNORMAL LOW (ref 8.9–10.3)
Chloride: 95 mmol/L — ABNORMAL LOW (ref 98–111)
Chloride: 95 mmol/L — ABNORMAL LOW (ref 98–111)
Creatinine, Ser: 1.09 mg/dL (ref 0.61–1.24)
Creatinine, Ser: 1.01 mg/dL (ref 0.61–1.24)
GFR, Estimated: >60 mL/min (ref >60)
GFR, Estimated: >60 mL/min (ref >60)
Glucose, Bld: 316 mg/dL — ABNORMAL HIGH (ref 70–99)
Glucose, Bld: 324 mg/dL — ABNORMAL HIGH (ref 70–99)
Phosphorus: 4.1 mg/dL (ref 2.5–4.6)
Phosphorus: 4.5 mg/dL (ref 2.5–4.6)
Potassium: 4.8 mmol/L (ref 3.5–5.1)
Potassium: 5.5 mmol/L — ABNORMAL HIGH (ref 3.5–5.1)
Sodium: 133 mmol/L — ABNORMAL LOW (ref 135–145)
Sodium: 132 mmol/L — ABNORMAL LOW (ref 135–145)

## 2023-06-11 LAB — CBC
HCT: 29.6 % — ABNORMAL LOW (ref 39.0–52.0)
HCT: 28.1 % — ABNORMAL LOW (ref 39.0–52.0)
Hemoglobin: 10.2 g/dL — ABNORMAL LOW (ref 13.0–17.0)
Hemoglobin: 9.7 g/dL — ABNORMAL LOW (ref 13.0–17.0)
MCH: 30.3 pg (ref 26.0–34.0)
MCH: 30.7 pg (ref 26.0–34.0)
MCHC: 34.5 g/dL (ref 30.0–36.0)
MCHC: 34.5 g/dL (ref 30.0–36.0)
MCV: 87.8 fL (ref 80.0–100.0)
MCV: 88.9 fL (ref 80.0–100.0)
Platelets: 30 10*3/uL — ABNORMAL LOW (ref 150–400)
Platelets: 39 10*3/uL — ABNORMAL LOW (ref 150–400)
RBC: 3.37 MIL/uL — ABNORMAL LOW (ref 4.22–5.81)
RBC: 3.16 MIL/uL — ABNORMAL LOW (ref 4.22–5.81)
RDW: 16.6 % — ABNORMAL HIGH (ref 11.5–15.5)
RDW: 16.9 % — ABNORMAL HIGH (ref 11.5–15.5)
WBC: 52.9 10*3/uL (ref 4.0–10.5)
WBC: 63.4 10*3/uL (ref 4.0–10.5)
nRBC: 2.6 % — ABNORMAL HIGH (ref 0.0–0.2)
nRBC: 3.4 % — ABNORMAL HIGH (ref 0.0–0.2)

## 2023-06-11 LAB — TYPE AND SCREEN
ABO/RH(D): B POS
Antibody Screen: NEGATIVE
Unit division: 0
Unit division: 0
Unit division: 0
Unit division: 0
Unit division: 0
Unit division: 0
Unit division: 0
Unit division: 0

## 2023-06-11 LAB — PREPARE PLATELET PHERESIS: Unit division: 0

## 2023-06-11 LAB — POCT I-STAT 7, (LYTES, BLD GAS, ICA,H+H)
Acid-Base Excess: 1 mmol/L (ref 0.0–2.0)
Acid-Base Excess: 1 mmol/L (ref 0.0–2.0)
Bicarbonate: 25.6 mmol/L (ref 20.0–28.0)
Bicarbonate: 26.6 mmol/L (ref 20.0–28.0)
Calcium, Ion: 1.13 mmol/L — ABNORMAL LOW (ref 1.15–1.40)
Calcium, Ion: 1.12 mmol/L — ABNORMAL LOW (ref 1.15–1.40)
HCT: 32 % — ABNORMAL LOW (ref 39.0–52.0)
HCT: 33 % — ABNORMAL LOW (ref 39.0–52.0)
Hemoglobin: 10.9 g/dL — ABNORMAL LOW (ref 13.0–17.0)
Hemoglobin: 11.2 g/dL — ABNORMAL LOW (ref 13.0–17.0)
O2 Saturation: 99 %
O2 Saturation: 98 %
Patient temperature: 36.8
Patient temperature: 36.8
Potassium: 4.4 mmol/L (ref 3.5–5.1)
Potassium: 4.2 mmol/L (ref 3.5–5.1)
Sodium: 135 mmol/L (ref 135–145)
Sodium: 135 mmol/L (ref 135–145)
TCO2: 27 mmol/L (ref 22–32)
TCO2: 28 mmol/L (ref 22–32)
pCO2 arterial: 40.1 mm[Hg] (ref 32–48)
pCO2 arterial: 45.2 mm[Hg] (ref 32–48)
pH, Arterial: 7.412 (ref 7.35–7.45)
pH, Arterial: 7.377 (ref 7.35–7.45)
pO2, Arterial: 113 mm[Hg] — ABNORMAL HIGH (ref 83–108)
pO2, Arterial: 105 mm[Hg] (ref 83–108)

## 2023-06-11 LAB — BPAM RBC
Blood Product Expiration Date: 202410272359
Blood Product Expiration Date: 202410292359
Blood Product Expiration Date: 202410292359
Blood Product Expiration Date: 202410292359
Blood Product Expiration Date: 202411022359
Blood Product Expiration Date: 202411022359
Blood Product Expiration Date: 202411032359
Blood Product Expiration Date: 202411032359
ISSUE DATE / TIME: 202409270842
ISSUE DATE / TIME: 202410021636
ISSUE DATE / TIME: 202410021636
ISSUE DATE / TIME: 202410041716
Unit Type and Rh: 7300
Unit Type and Rh: 7300
Unit Type and Rh: 7300
Unit Type and Rh: 7300
Unit Type and Rh: 7300
Unit Type and Rh: 7300
Unit Type and Rh: 7300
Unit Type and Rh: 7300

## 2023-06-11 LAB — LACTATE DEHYDROGENASE: LDH: 1065 U/L — ABNORMAL HIGH (ref 98–192)

## 2023-06-11 LAB — HEPATIC FUNCTION PANEL
ALT: 14 U/L (ref 0–44)
AST: 79 U/L — ABNORMAL HIGH (ref 15–41)
Albumin: 2.4 g/dL — ABNORMAL LOW (ref 3.5–5.0)
Alkaline Phosphatase: 137 U/L — ABNORMAL HIGH (ref 38–126)
Bilirubin, Direct: 2 mg/dL — ABNORMAL HIGH (ref 0.0–0.2)
Indirect Bilirubin: 1.6 mg/dL — ABNORMAL HIGH (ref 0.3–0.9)
Total Bilirubin: 3.6 mg/dL — ABNORMAL HIGH (ref 0.3–1.2)
Total Protein: 7.9 g/dL (ref 6.5–8.1)

## 2023-06-11 LAB — CG4 I-STAT (LACTIC ACID): Lactic Acid, Venous: 2.7 mmol/L (ref 0.5–1.9)

## 2023-06-11 LAB — BPAM PLATELET PHERESIS
Blood Product Expiration Date: 202410052359
ISSUE DATE / TIME: 202410040831
Unit Type and Rh: 6200

## 2023-06-11 LAB — GLUCOSE, CAPILLARY
Glucose-Capillary: 334 mg/dL — ABNORMAL HIGH (ref 70–99)
Glucose-Capillary: 329 mg/dL — ABNORMAL HIGH (ref 70–99)
Glucose-Capillary: 321 mg/dL — ABNORMAL HIGH (ref 70–99)

## 2023-06-11 LAB — FIBRINOGEN: Fibrinogen: 443 mg/dL (ref 210–475)

## 2023-06-11 LAB — POTASSIUM: Potassium: 5.6 mmol/L — ABNORMAL HIGH (ref 3.5–5.1)

## 2023-06-11 LAB — PROTIME-INR
INR: 1.5 — ABNORMAL HIGH (ref 0.8–1.2)
Prothrombin Time: 18 s — ABNORMAL HIGH (ref 11.4–15.2)

## 2023-06-11 LAB — APTT: aPTT: 31 s (ref 24–36)

## 2023-06-11 SURGERY — VIDEO BRONCHOSCOPY WITHOUT FLUORO
Anesthesia: Moderate Sedation | Laterality: Left

## 2023-06-11 MED ORDER — ORAL CARE MOUTH RINSE
15.0000 mL | OROMUCOSAL | Status: DC
  Administered 2023-06-12 (×5): 15 mL via OROMUCOSAL

## 2023-06-11 MED ORDER — EPINEPHRINE PF 1 MG/ML IJ SOLN
INTRAMUSCULAR | Status: DC | PRN
  Administered 2023-06-11 (×3): 2 mL via ENDOTRACHEOPULMONARY

## 2023-06-11 MED ORDER — SODIUM CHLORIDE 0.9 % IV SOLN
20.0000 ug | Freq: Once | INTRAVENOUS | Status: AC
  Administered 2023-06-11: 20 ug via INTRAVENOUS
  Filled 2023-06-11: qty 5

## 2023-06-11 MED ORDER — SODIUM CHLORIDE 0.9% IV SOLUTION: Freq: Once | INTRAVENOUS | Status: AC

## 2023-06-11 MED ORDER — TRACE MINERALS CU-MN-SE-ZN 300-55-60-3000 MCG/ML IV SOLN
INTRAVENOUS | Status: DC
  Filled 2023-06-11: qty 936

## 2023-06-11 MED ORDER — TRANEXAMIC ACID-NACL 1000-0.7 MG/100ML-% IV SOLN
1000.0000 mg | Freq: Once | INTRAVENOUS | Status: AC
  Administered 2023-06-11: 1000 mg via INTRAVENOUS
  Filled 2023-06-11: qty 100

## 2023-06-11 MED ORDER — PRISMASOL BGK 0/2.5 32-2.5 MEQ/L EC SOLN
Status: DC
  Filled 2023-06-11 (×8): qty 5000

## 2023-06-11 MED ORDER — INSULIN ASPART 100 UNIT/ML IJ SOLN
0.0000 [IU] | Freq: Four times a day (QID) | INTRAMUSCULAR | Status: DC
  Administered 2023-06-11 (×2): 11 [IU] via SUBCUTANEOUS
  Administered 2023-06-12: 15 [IU] via SUBCUTANEOUS
  Administered 2023-06-12: 11 [IU] via SUBCUTANEOUS

## 2023-06-11 MED ORDER — ORAL CARE MOUTH RINSE: 15.0000 mL | OROMUCOSAL | Status: DC | PRN

## 2023-06-11 NOTE — Progress Notes (Signed)
NAME:  [name], MRN:  [id number], DOB:  1961-04-04, LOS: 24 ADMISSION DATE:  05/30/23, CONSULTATION DATE:  9/11 REFERRING MD:  Dr. Izora Ribas, CHIEF COMPLAINT:  aortic valve dehiscence   History of Present Illness:  Patient is [name] w/ pertinent PMH CAD s/p CABG 2017 w/ AVR, HLD, HTN, prior CVA, T2DM, CKD4 was previously on HD presents to [facility] on 2023/05/30 chest pain.  Patient recently admitted to [facility] on 5/25 w/ AKI and AMS. While in ED patient had PEA arrest w/ ROSC in about 8 minutes. Patient required dialysis on this admission. Also w/ MSSA bacteremia associated w/ tricuspid valve endocarditis. Patient transferred to [facility] on 5/25. Treated w/ 6 week course oxacillin and rifampin. This admission also suffered a R cerebellar CVA w/ MRI likely septic emboli and had cholangitis/pancreatitis requiring ERCP.  On 2023/05/30 patient admitted to [facility] w/ chest pain, dyspnea, and BLE edema. BNP 2,097. CXR w/ pulm edema. Patient started on IV lasix infusion. Cards consulted. On 9/11 patient transferred to [facility]. Patient's echo showing possible aortic valve dehiscence. Cultures repeated and started on rocephin. Patient transferred to ICU for TEE and general anesthesia and to start CRRT. PCCM consulted.  Pertinent  Medical History   Past Medical History:  Diagnosis Date   Allergy    Anemia    Anxiety    Baker's cyst of knee    Blood transfusion without reported diagnosis    as baby    Coronary artery disease    quadruple bypass - March 2016   GERD (gastroesophageal reflux disease)    Gouty arthritis    "real bad" (01/17/2013)   Heart murmur    Hypercholesteremia    Hypertension    MSSA bacteremia 01/29/2023   Myocardial infarction (HCC) 2017   PEA (Pulseless electrical activity) (HCC) 01/29/2023   Stroke (HCC)    Type II diabetes mellitus (HCC)     Significant Hospital Events: Including procedures, antibiotic start and stop dates in addition to other pertinent events    May 30, 2023 admitted 9/11 echo showing aortic valve dehiscence, confirmed by TEE.  9/11 started (back) on CRRT for volume overload and known CKD 4 9/12 breathing better after CRRT 9/13 to OR for Redo of aortic valve, tricuspid valve repair and ascending aortic root replacement w/ re-implantation of SVG to RCA. Post op TEE EF 55% RV mid-mod HK, AVR/TV repair stable. Could not come off CPB. Worse pulmonary edema. High dose pressors. Cannulated and started on VA ECMO w/ central cannulation  9/16 MDT ECMO rounds this AM, plans to remove more volume, 1 U PRBCs  9/18 bronch, clot and mucus plugs removed  9/19 OR for washout  9/20 bronch 9/21 bronch 9/24 VA to VV, some bleeding and acidemia issues postop 9/25 ett exchange and bronch 10/1 trach 10/4 cough>>airway bleeding from left, bronch, recurrent airway bleeding  Interim History / Subjective:  See bronch note. Otherwise stable night  Objective   Blood pressure (!) 87/52, pulse 100, temperature 98.4 F (36.9 C), resp. rate (!) 0, height 5\' 6"  (1.676 m), weight 71.3 kg, SpO2 99%. CVP:  [5 mmHg-30 mmHg] 10 mmHg  Vent Mode: PCV FiO2 (%):  [50 %] 50 % Set Rate:  [20 bmp] 20 bmp Vt Set:  [250 mL] 250 mL PEEP:  [5 cmH20-10 cmH20] 10 cmH20 Plateau Pressure:  [26 cmH20-28 cmH20] 28 cmH20   Intake/Output Summary (Last 24 hours) at 06/08/2023 0934 Last data filed at 06/23/2023 0900 Gross per 24 hour  Intake 3880.22 ml  Output 3512.2 ml  Net 368.02 ml   Filed Weights   06/09/23 0458 07/07/2023 0500 06/20/2023 0600  Weight: 73.4 kg 72.6 kg 71.3 kg    Examination: Weak Moves to command but weakly Near absent breath sounds on left, R with rhonci Triggers vent intermittently ECMO circuit looks okay Heart sounds regular Sternotomy/trach sites look okay  CBC/BMP/CXR reviewed  Assessment & Plan:  Post cardiotomy vasoplegia/cardioplegia w/ failure to wean from CPB- improved; 9/24 converted to VV Acute on chronic combined HF, Hx MSSA endocarditis-  S/p AVR/Bentall and reimplantation of Coronaries and TV repair 9/13 Acute respiratory failure w/ hypoxia due to volume overload, with untreated OSA, and dense nonresolving R infiltrate growing aspergillus terreus Airway bleeding- LUL, started 10/4, refractory.  Not good candidate for bronchial artery embolization. CKD 4. Had recently been on HD- now on CRRT Circuit related hemolysis, thrombocytopenia consumptive- stable Hyperbilirubinemia TF intolerance- ongoing issue now on TPN CAD s/p CABG in 2017 w/ AVR HTN HLD Hx of CVA DMT2 Leukocytosis, multifactorial   - On ancef for presumed lingering IE - Micafungin for a. Terreus, duration per ID - Prednisone 40 x 1 week then 20 x 1 week - Hold bival for now - Sweep for pH 7.35-7.45 - Vasopressin, levo for MAP 6 - Continue TPN while we await return of bowel function - Sedation PRN - Plts, TXA, DDAVP ordered - Repeat diagnostic bronch this afternoon, if left side plugs back up with blood we will plan on GOC discussion tomorrow am - Discussed above with AHF+TCTS+ECMO specialists   Best Practice (right click and "Reselect all SmartList Selections" daily)   Diet/type: TPN DVT prophylaxis: bival GI prophylaxis: PPI Lines: Central line, Dialysis Catheter, and Arterial Line Foley: yes Code Status:  full code Last date of multidisciplinary goals of care discussion [per TCTS and AHF]   This patient is critically ill with multiple organ system failure which requires frequent high complexity decision making, assessment, support, evaluation, and titration of therapies. This was completed through the application of advanced monitoring technologies and extensive interpretation of multiple databases. During this encounter critical care time was devoted to patient care services described in this note for 45 minutes.  Lorin Glass, MD 06/14/2023 9:34 AM [facility]  For contact information, see Amion. If no response to pager,  please call PCCM consult pager. After hours, 7PM- 7AM, please call Elink.

## 2023-06-11 NOTE — Progress Notes (Signed)
[address]       [place] [address]             [phone]                 Day of Surgery Procedure(s) (LRB): VIDEO BRONCHOSCOPY WITHOUT FLUORO (Left) HEMOSTASIS CONTROL CRYOTHERAPY   Events: Hemoptysis  _______________________________________________________________ Vitals: BP (!) 87/52   Pulse 100   Temp 98.2 F (36.8 C)   Resp (!) 0   Ht 5\' 6"  (1.676 m)   Wt 71.3 kg   SpO2 99%   BMI 25.37 kg/m  Filed Weights   06/09/23 0458 06/23/2023 0500 06/14/2023 0600  Weight: 73.4 kg 72.6 kg 71.3 kg     - Neuro: sedated  - Cardiovascular: afib  Drips: levo 20, vaso 0.04.   CVP:  [5 mmHg-30 mmHg] 10 mmHg ECMO:  - Pulm:  Vent Mode: PCV FiO2 (%):  [50 %] 50 % Set Rate:  [20 bmp] 20 bmp Vt Set:  [250 mL] 250 mL PEEP:  [5 cmH20-10 cmH20] 10 cmH20 Plateau Pressure:  [26 cmH20-28 cmH20] 28 cmH20  ABG    Component Value Date/Time   PHART 7.412 06/15/2023 0409   PCO2ART 40.1 07/07/2023 0409   PO2ART 113 (H) 06/19/2023 0409   HCO3 25.6 06/14/2023 0409   TCO2 27 07/07/2023 0409   ACIDBASEDEF 2.0 06/29/2023 1621   O2SAT 99 06/28/2023 0409    - Abd: ND - Extremity: cool  .Intake/Output      10/04 0701 10/05 0700 10/05 0701 10/06 0700   I.V. (mL/kg) 2698.9 (37.9) 214.5 (3)   Blood 615    Other 30    NG/GT 20 20   IV Piggyback 509.9 0.2   Total Intake(mL/kg) 3873.8 (54.3) 234.7 (3.3)   Emesis/NG output 200 0   Stool 160 55   Chest Tube 4 0   CRRT 3096.5 200.1   Total Output 3460.5 255.1   Net +413.3 -20.4           _______________________________________________________________ Labs:    Latest Ref Rng & Units 07/04/2023    4:09 AM 06/20/2023    4:06 AM 07/03/2023    7:46 PM  CBC  WBC 4.0 - 10.5 K/uL  52.9    Hemoglobin 13.0 - 17.0 g/dL 91.4  78.2  95.6   Hematocrit 39.0 - 52.0 % 32.0  29.6  31.0   Platelets 150 - 400 K/uL  30        Latest Ref Rng & Units 07/05/2023    4:09 AM 07/02/2023    4:06 AM 07/02/2023    7:46 PM   CMP  Glucose 70 - 99 mg/dL  213    BUN 8 - 23 mg/dL  54    Creatinine 0.86 - 1.24 mg/dL  5.78    Sodium 469 - 629 mmol/L 135  133  136   Potassium 3.5 - 5.1 mmol/L 4.4  4.8  4.5   Chloride 98 - 111 mmol/L  95    CO2 22 - 32 mmol/L  24    Calcium 8.9 - 10.3 mg/dL  8.4    Total Protein 6.5 - 8.1 g/dL  7.9    Total Bilirubin 0.3 - 1.2 mg/dL  3.6    Alkaline Phos 38 - 126 U/L  137    AST 15 - 41 U/L  79    ALT 0 - 44 U/L  14      CXR: -  _______________________________________________________________  Assessment and Plan: 9/13: OR for Bentall/AVR, TV repair, re-implantation of SVG-RCA  9/24: Decannulated from [facility] ECMO. Chest closed. Transitioned to VV ECMO 10/1: [facility] meeting tomorrow.   [name] 06/15/2023 10:16 AM

## 2023-06-11 NOTE — Procedures (Addendum)
Bronchoscopy Procedure Note  Creg Gilmer  [id number]  April 19, 1961  Date:06-23-23  Time:8:36 AM   Provider Performing:Camren Henthorn C Katrinka Blazing   Procedure(s):   Subsequent Therapeutic Aspiration of Tracheobronchial Tree [phone]); foreign body removal (62130)   Indication(s) Tracheal occlusion  Consent Part of ECMO consent  Anesthesia Dilaudid, versed   Time Out Verified patient identification, verified procedure, site/side was marked, verified correct patient position, special equipment/implants available, medications/allergies/relevant history reviewed, required imaging and test results available.   Sterile Technique Usual hand hygiene, masks, gowns, and gloves were used   Procedure Description Bronchoscope advanced through tracheostomy tube.  Trachea was completely occluded with clot.  With over 1 hour of alternating washing, cryo, forceps, suction, able to dissect out clot down to compeltely open R side and most of left side.  Active oozing seen from LUL (photo).  Despite topical TXA, epi, cold saline, continued to ooze.  Will give DDAVP, plts, systemic TXA.  Will do relook this afternoon, if left lung fills back up with blood we are at EOL.  Tentative family meeting tomorrow 9am.    Complications/Tolerance None; patient tolerated the procedure well. Chest X-ray is not needed post procedure.   EBL Minimal   Specimen(s) None    LUL (left), Lingula (R), active refractory oozing LUL

## 2023-06-11 NOTE — Progress Notes (Signed)
Patient ID: [name], male   DOB: March 27, 1961, 62 y.o.   MRN: [id number]   Extracorporeal support note   ECLS support day: 11 (V-V) (Day 23 of ECLS support) Indication: Acute hypoxic respiratory failure  Configuration: Crescent VV ECMO  30FR Dual-lumen Crescent cannula in RIJ   Pump speed: 3650rpm  Pump flow: 3.8 L/min Pump used: Cardiohelp  Sweep gas: 7  ABG: 7.4/40/113 LDH: 1065  Circuit check: small clot in oxy Anticoagulant: Holding anticoagulation Anticoagulation targets:  N/A  Changes in support: none  Anticipated goals/duration of support:  - Bronch this morning. Plan for repeat bronch this afternoon.  - Plan to withdraw care within the next 24h.    Dwayne Bomberger DO  06/16/2023, 9:09 AM

## 2023-06-11 NOTE — Progress Notes (Signed)
Pharmacy Antibiotic Note  [name] is a 62 y.o. male admitted on 05/27/2023 with  escalation in fungal CNS/PNA coverage  Remains on micafungin for aspergillus terreus complex.   WBC 52.9, low temps on CRRT and TPN.     Plan: Continue micafungin 200 mg q 24 hrs.   Height: 5\' 6"  (167.6 cm) Weight: 71.3 kg (157 lb 3 oz) IBW/kg (Calculated) : 63.8  Temp (24hrs), Avg:98.3 F (36.8 C), Min:98.2 F (36.8 C), Max:98.4 F (36.9 C)  Recent Labs  Lab 06/08/23 1405 06/08/23 1638 06/08/23 1642 06/09/23 0355 06/09/23 0401 06/09/23 1654 06/14/2023 0348 07/05/2023 0403 06/18/2023 1040 06/17/2023 1613 06/18/2023 0406 06/17/2023 0407  WBC  --    < >  --  43.3*  --  43.3* 46.3*  --  67.4* 62.9* 52.9*  --   CREATININE  --    < >  --  1.15  --  1.12 1.06  --   --  1.09 1.09  --   LATICACIDVEN 5.9*  --  3.3*  --  2.3*  --   --  2.0*  --   --   --  2.7*   < > = values in this interval not displayed.    Estimated Creatinine Clearance: 62.6 mL/min (by C-G formula based on SCr of 1.09 mg/dL).    Allergies  Allergen Reactions   Other Anaphylaxis    Mushrooms  Not listed on MAR    Plavix [Clopidogrel Bisulfate] Other (See Comments)    TTP Not listed on the [facility]   Fleet Enema [Enema] Other (See Comments)    Unknown reaction   Lovenox [Enoxaparin] Other (See Comments)    Unknown reaction   Morphine Other (See Comments)    Unknown reaction   Nsaids Other (See Comments)    Unknown reaction   Hydrocodone-Acetaminophen Itching    Antimicrobials this admission: Cefazolin 9/28 >> Ceftriaxone 9/11>> 9/15 Meropenem 9/15 >>9/28 Vanc 9/11>>9/27  Micafungin 9/20 >>9/30, 10/2 >>  AmphoB 9/28 >> 10/1  Dose adjustments this admission: VT 17 (therapeutic) on 9/16 and 9/23  Microbiology results: 10/1 BAL: 3K fungal colonies 9/28 BAL: NGTD, few fungi (likely contaminant) 9/20 BAL: candida albicans, aspergillus terreus  Aortic valve 9/13: neg Bcx 9/11: neg  Thank you for allowing pharmacy to  be a part of this patient's care.  Reece Leader, Loura Back, BCPS, BCCP Clinical Pharmacist  07/03/2023 1:32 PM   [facility] pharmacy phone numbers are listed on [email or web address]

## 2023-06-11 NOTE — Progress Notes (Signed)
Patient ID: [name], male   DOB: 03/23/1961, 62 y.o.   MRN: [id number]   S:  Seen and examined on CRRT; procedure supervised.  He had 3.0 liters UF over 10/4 with CRRT charted thus far.  Goal UF has been keep even as tolerated.  He continues on ECMO.  He is on levo at 20 mcg/min.  Vasopressin is back on at 0.04 mcg/min.  note per charting that he had significant hemoptysis of bright red blood to the ventilator circuit.  Had bronch on Jul 06, 2023 with extensive clot retrieval from left mainstem.  Bivalirudin infusion was placed on hold per CTS note.  Teams have been communicating with his family and a meeting is scheduled for Sunday per charting.  Note that he unfortunately had another episode of hemoptysis in the interim and another bronch is planned.  Spoke with ECMO specialist and nursing. CVP is 8.  In and out of afib - rate-controlled; lots of ectopy.  Review of systems:  Unable to obtain secondary to intubated and obtunded     O:BP (!) 97/55   Pulse 93   Temp 98.4 F (36.9 C) (Core)   Resp 20   Ht 5\' 6"  (1.676 m)   Wt 72.6 kg   SpO2 95%   BMI 25.83 kg/m   Intake/Output Summary (Last 24 hours) at 06/29/2023 0608 Last data filed at 06/20/2023 0600 Gross per 24 hour  Intake 3859.1 ml  Output 3449.6 ml  Net 409.5 ml   Intake/Output: I/O last 3 completed shifts: In: 5149.8 [I.V.:3525.4; Blood:615; Other:90; IV Piggyback:919.4] Out: 4149 [Emesis/NG output:100; Stool:250; Chest Tube:4]  Intake/Output this shift:  Total I/O In: 1347.1 [I.V.:1169.8; NG/GT:20; IV Piggyback:157.3] Out: 2004 [Emesis/NG output:100; Stool:80] Weight change:   General adult male in bed critically ill        HEENT normocephalic atraumatic  Neck supple trachea midline Lungs coarse mechanical breath sounds  Heart  On ECMO; S1S2  Abdomen soft nontender with limitation of obtunded; nondistended Extremities no pitting edema; decreased skin turgor of feet  Neuro - no continuous sedation GU no foley  Access  right subclavian tunneled dialysis catheter in place    Recent Labs  Lab 06/05/23 0346 06/05/23 0347 06/06/23 0415 06/06/23 0646 06/07/23 0354 06/07/23 0727 06/08/23 0424 06/08/23 0432 06/08/23 1638 06/08/23 1952 06/09/23 0355 06/09/23 0402 06/09/23 1654 06/09/23 2002 06-Jul-2023 0348 2023/07/06 0402 06-Jul-2023 1042 Jul 06, 2023 1214 06-Jul-2023 1613 July 06, 2023 1621 2023/07/06 1946 06/07/2023 0406 06/15/2023 0409  NA 135  135   < > 134*   < > 134*   < > 133*   < > 134*  136   < > 134*   < > 135   < > 134*   < > 139 138 133* 137 136 133* 135  K 4.2  4.2   < > 4.1   < > 4.1   < > 4.6   < > 4.0  4.1   < > 4.1   < > 4.1   < > 3.7   < > 3.5 3.6 4.5 4.3 4.5 4.8 4.4  CL 99  98   < > 99   < > 98   < > 100  --  100  --  98  --  99  --  98  --   --   --  96*  --   --  95*  --   CO2 22  23   < > 22   < > 21*   < >  22  --  21*  --  20*  --  22  --  23  --   --   --  23  --   --  24  --   GLUCOSE 173*  171*   < > 200*   < > 176*   < > 231*  --  259*  --  248*  --  283*  --  170*  --   --   --  263*  --   --  316*  --   BUN 34*  34*   < > 37*   < > 37*   < > 45*  --  48*  --  51*  --  51*  --  50*  --   --   --  46*  --   --  54*  --   CREATININE 1.13  1.11   < > 1.23   < > 1.11   < > 1.16  --  1.18  --  1.15  --  1.12  --  1.06  --   --   --  1.09  --   --  1.09  --   ALBUMIN 2.0*  2.0*   < > 2.0*   < > 2.0*  2.0*   < > 2.3*  2.2*  --  2.2*  --  2.2*  --  2.4*  --  2.4*  2.4*  --   --   --  2.5*  --   --  2.4*  --   CALCIUM 8.6*  8.6*   < > 8.7*   < > 8.5*   < > 8.6*  --  8.6*  --  8.5*  --  8.4*  --  8.3*  --   --   --  8.5*  --   --  8.4*  --   PHOS 3.1   < > 3.3   < > 3.5   < > 3.9  --  4.0  --  3.1  --  3.2  --  3.1  --   --   --  4.5  --   --  4.1  --   AST 116*  --  126*  --  109*  --  98*  --   --   --  100*  --   --   --  97*  --   --   --   --   --   --   --   --   ALT 17  --  17  --  14  --  10  --   --   --  12  --   --   --  15  --   --   --   --   --   --   --   --    < > = values in  this interval not displayed.   Liver Function Tests: Recent Labs  Lab 06/08/23 0424 06/08/23 1638 06/09/23 0355 06/09/23 1654 07/05/2023 0348 06/21/2023 1613 06/13/23 0406  AST 98*  --  100*  --  97*  --   --   ALT 10  --  12  --  15  --   --   ALKPHOS 135*  --  129*  --  136*  --   --   BILITOT 4.1*  --  4.0*  --  3.4*  --   --   PROT  8.5*  --  8.0  --  7.6  --   --   ALBUMIN 2.3*  2.2*   < > 2.2*   < > 2.4*  2.4* 2.5* 2.4*   < > = values in this interval not displayed.   No results for input(s): "LIPASE", "AMYLASE" in the last 168 hours. Recent Labs  Lab 06/04/23 1229  AMMONIA 40*   CBC: Recent Labs  Lab 06/09/23 1654 06/09/23 2002 06/07/2023 0348 06/28/2023 0402 06/08/2023 1040 06/08/2023 1042 06/15/2023 1613 06/15/2023 1621 06/19/2023 1946 06/21/2023 0406 06/26/2023 0409  WBC 43.3*  --  46.3*  --  67.4*  --  62.9*  --   --  52.9*  --   HGB 9.8*   < > 9.2*   < > 8.9*   < > 8.7*   < > 10.5* 10.2* 10.9*  HCT 28.3*   < > 27.4*   < > 26.5*   < > 26.1*   < > 31.0* 29.6* 32.0*  MCV 89.3  --  88.7  --  89.5  --  90.6  --   --  87.8  --   PLT 24*  --  23*  --  63*  --  49*  --   --  30*  --    < > = values in this interval not displayed.   Cardiac Enzymes: No results for input(s): "CKTOTAL", "CKMB", "CKMBINDEX", "TROPONINI" in the last 168 hours. CBG: Recent Labs  Lab 06/17/2023 0400 07/04/2023 0806 07/07/2023 1103 06/23/2023 1214 06/25/2023 1618  GLUCAP 175* 152* 159* 209* 223*    Iron Studies: No results for input(s): "IRON", "TIBC", "TRANSFERRIN", "FERRITIN" in the last 72 hours. Studies/Results: DG CHEST PORT 1 VIEW  Result Date: 06/26/2023 CLINICAL DATA:  ECMO. EXAM: PORTABLE CHEST 1 VIEW COMPARISON:  One-view chest x-ray 06/09/2023 FINDINGS: A right IJ catheter terminates in the IVC. Bilateral chest tubes are in place. A second right IJ catheter terminates at the cavoatrial junction. Central gastric tube is in the stomach. The heart size is normal. Diffuse interstitial and airspace  opacities are similar the prior exam. Bilateral pleural effusions, left greater than right are also similar. IMPRESSION: 1. Stable support apparatus. 2. Stable diffuse interstitial and airspace opacities. 3. Stable bilateral pleural effusions, left greater than right. Electronically Signed   By: Marin Roberts M.D.   On: 06/07/2023 12:20   DG CHEST PORT 1 VIEW  Result Date: 06/27/2023 CLINICAL DATA:  [id number] Patient receiving extracorporeal membrane oxygenation (ECMO) [id number]. EXAM: PORTABLE CHEST 1 VIEW COMPARISON:  06/09/2023. FINDINGS: Redemonstration of bilateral heterogeneous opacities, right more than left, without significant interval change. There is probable layering left pleural effusion, new/more pronounced since the prior. No significant right pleural effusion. Stable cardio-mediastinal silhouette. No acute osseous abnormalities. The soft tissues are within normal limits. Tracheostomy tube, right IJ hemodialysis catheter, enteric tube, bilateral pleural drainage catheters and ECMO device are essentially unchanged in position. IMPRESSION: 1. Stable bilateral heterogeneous opacities, right more than left. 2. Probable layering left pleural effusion, new/more pronounced since the prior. 3. Stable support apparatus. Electronically Signed   By: Jules Schick M.D.   On: 06/22/2023 08:16   DG CHEST PORT 1 VIEW  Result Date: 06/09/2023 CLINICAL DATA:  ECMO. EXAM: PORTABLE CHEST 1 VIEW COMPARISON:  June 08, 2023. FINDINGS: Tracheostomy tube is unchanged. Nasogastric tube is seen entering stomach. Right internal jugular catheter is unchanged. Bilateral chest tubes are noted without pneumothorax. ECMO device is noted. Stable bilateral lung opacities are noted.  IMPRESSION: Stable support apparatus.  Stable bilateral opacities. Electronically Signed   By: Lupita Raider M.D.   On: 06/09/2023 10:32    sodium chloride   Intravenous Once   sodium chloride   Intravenous Once   artificial tears   Both  Eyes Q8H   bisacodyl  10 mg Rectal Daily   Chlorhexidine Gluconate Cloth  6 each Topical Daily   darbepoetin (ARANESP) injection - DIALYSIS  40 mcg Subcutaneous Q Wed-1800    HYDROmorphone (DILAUDID) injection  0.5 mg Intravenous Once   insulin aspart  0-15 Units Subcutaneous Q8H   methylPREDNISolone (SOLU-MEDROL) injection  40 mg Intravenous Q24H   Followed by   [name] ON 06/17/2023] methylPREDNISolone (SOLU-MEDROL) injection  20 mg Intravenous Q24H   mouth rinse  15 mL Mouth Rinse Q2H   pantoprazole (PROTONIX) IV  40 mg Intravenous QHS   sodium chloride flush  3 mL Intravenous Q12H   tranexamic acid  500 mg Nebulization Q8H    BMET    Component Value Date/Time   NA 135 06/15/2023 0409   NA 143 11/02/2022 1454   K 4.4 06/18/2023 0409   CL 95 (L) 07/02/2023 0406   CO2 24 07/06/2023 0406   GLUCOSE 316 (H) 06/28/2023 0406   BUN 54 (H) 07/06/2023 0406   BUN 31 (H) 11/02/2022 1454   CREATININE 1.09 06/22/2023 0406   CREATININE 2.71 (H) 05/30/2023 0950   CREATININE 1.05 02/24/2015 1601   CALCIUM 8.4 (L) 06/28/2023 0406   GFRNONAA >60 07/01/2023 0406   GFRNONAA 26 (L) 05/10/2023 0950   GFRNONAA 80 02/24/2015 1601   GFRAA 62 10/09/2020 1520   GFRAA >89 02/24/2015 1601   CBC    Component Value Date/Time   WBC 52.9 (HH) 06/20/2023 0406   RBC 3.37 (L) 07/07/2023 0406   HGB 10.9 (L) 06/09/2023 0409   HGB 6.4 (LL) 05/29/2023 0852   HGB 10.2 (L) 08/12/2022 0934   HCT 32.0 (L) 07/02/2023 0409   HCT 29.2 (L) 08/12/2022 0934   PLT 30 (L) 07/03/2023 0406   PLT 109 (L) 06/06/2023 0852   PLT 252 08/12/2022 0934   MCV 87.8 06/30/2023 0406   MCV 94 08/12/2022 0934   MCH 30.3 06/23/2023 0406   MCHC 34.5 06/26/2023 0406   RDW 16.6 (H) 06/08/2023 0406   RDW 15.5 (H) 08/12/2022 0934   LYMPHSABS 5.8 (H) 05/29/2023 1722   LYMPHSABS 1.9 11/04/2021 1028   MONOABS 6.3 (H) 05/29/2023 1722   EOSABS 0.0 05/29/2023 1722   EOSABS 0.2 11/04/2021 1028   BASOSABS 0.0 05/29/2023 1722   BASOSABS  0.1 11/04/2021 1028    Assessment/Plan:   Anuric AKI/dialysis dependent AKI with fluid overload: Started on RRT during previous admission in May 2024 until 05/10/23 (recovered). Patient is status post cardiac surgery on 9/13 and currently on ECMO.  CRRT start 9/11. No signs of renal recovery.   Continue CRRT - he is on ECMO.  UF is keep even as tolerated.  Please page nephrology for UF goal changes All 4K fluids Now off of angiomax per primary team Severe prosthetic AI due to previous MSSA of endocarditis with partial valve dehiscence, status post redo AVR and TV repair on 9/13.  Per CTS Hemoptysis - s/p bronch; invasive fungal infection; felt to be at high risk for recurrence.  Happened again and another bronch is planned as well as family meeting on 10/6  Shock: multifactorial including cardiogenic and septic, on pressors and ECMO per primary team. Right lung consolidation. 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 ECMO. Fungal PNA. Abx/antifungal per primary service and ID   Anemia, thrombocytopenia: Transfuse PRBC's and/or platelets as needed per primary team.  Started aranesp 40 mcg every Wednesday on 10/2 Hypophosphatemia - repletion prn  Disposition - in ICU on CRRT, ECMO.  Critically ill    Estanislado Emms, MD 6:33 AM 07/03/2023

## 2023-06-11 NOTE — Progress Notes (Signed)
PHARMACY - TOTAL PARENTERAL NUTRITION CONSULT NOTE   Indication:  unable to tolerate tube feeding  Patient Measurements: Height: 5\' 6"  (167.6 cm) Weight: 71.3 kg (157 lb 3 oz) IBW/kg (Calculated) : 63.8 TPN AdjBW (KG): 69 Body mass index is 25.37 kg/m. Usual Weight: ~90kg  Assessment:  62 yo male with significant PMH for HLD, HTN, T2DM, CKD4 (prev on HD) and recent MSSA bacteremia + endocarditis (May 2024). Back in May 2024 admission, underwent ERCP for cholangitis/pancreatitis. Admitted on 9/11 w/ chest pain found to have aortic valve dehiscence. S/p aortic valve replacement redo on 9/13 - developed pulmonary edema post op and was placed on [place] ECMO, transitioned to VV ECMO 9/24. S/p mediastinal washout on 9/19 - chest still open. Unable to speak w/ patient due to intubation (utilizing Precedex for sedation). Pharmacy consulted to manage TPN for inability to tolerate tube feeding and malnutrition.   Glucose / Insulin: hx T2DM - A1c 6.1%, on Farxiga PTA. BG 316-329 up (trending down). Used 16 units SSI in 24 hours. 45 units insulin regular in bag -10/1 started steroid course, plan for 2 week taper starting 2023-06-27 Electrolytes: K 4.4, Mg 2.6 trending up (none in TPN), CoCa ~9.6/iCa 1.13, others wnl Renal: Hx CKD4 on CRRT since 9/11 (required HD in the past), 4K bags, systemic bivalirudin for anticoag - not using systemic sodium citrate infusion, only for catheter lock per d/w RN at bedside on 9/24; BUN 50 Hepatic: Alk phos 136, AST 97, ALT wnl, Tbili 4, albumin 2.2, TG 188  Intake / Output:  Stool 160 mL, NG 200 mL, Chest tube 4 mL, CRRT removed 3097 mL (keeping even)  GI Imaging:  9/27 CT A/P : mild wall thickening in several small bowel loops suggesting enteritis, cholelithiasis GI Surgeries / Procedures:  9/24 transition to VV ECMO 9/20 Cortrak placed  9/22 NGT placed  Central access: 9/13 TPN start date: 9/23  Nutritional Goals: Goal concentrated TPN rate is 75 mL/hr (provides 140 g  of protein and 2201 kcals per day)  RD Assessment: Estimated Needs Total Energy Estimated Needs: 2200-2400 Total Protein Estimated Needs: 130-150 grams Total Fluid Estimated Needs: 1L plus UOP  Current Nutrition:  NPO and TPN  Plan:  Continue concentrated TPN at 75 mL/hr at 1800 - meeting 100% of needs Electrolytes in TPN: Na 125 mEq/L, K to 10 mEq/L, Ca 0 mEq/L, Mg 0 mEq/L, and Phos 20 mmol/L, ZO:XWRU to max acet  Standard MVI and trace elements within TPN No chromium (on CRRT and currently on shortage) Adjust to moderate q6h SSI Increase to 50 units insulin regular in TPN. adjust as needed considering steroid taper  [facility]F at [facility] Monitor TPN labs on Mon/Thurs, RFP BID while on CRRT   F/u cortrak placement and TF restart  F/u GOC   Thank you for allowing pharmacy to be a part of this patient's care.  Thelma Barge, PharmD Clinical Pharmacist

## 2023-06-11 NOTE — Plan of Care (Signed)
Problem: Activity: Goal: Ability to tolerate increased activity will improve Outcome: Not Progressing   Problem: Respiratory: Goal: Ability to maintain a clear airway and adequate ventilation will improve Outcome: Not Progressing   Problem: Role Relationship: Goal: Method of communication will improve Outcome: Not Progressing   Problem: Education: Goal: Ability to manage disease process will improve Outcome: Not Progressing   Problem: Cardiac: Goal: Ability to achieve and maintain adequate cardiopulmonary perfusion will improve Outcome: Not Progressing   Problem: Neurologic: Goal: Promote progressive neurologic recovery Outcome: Not Progressing   Problem: Skin Integrity: Goal: Risk for impaired skin integrity will be minimized. Outcome: Not Progressing   Problem: Fluid Volume: Goal: Ability to maintain a balanced intake and output will improve Outcome: Not Progressing   Problem: Metabolic: Goal: Ability to maintain appropriate glucose levels will improve Outcome: Not Progressing   Problem: Nutritional: Goal: Maintenance of adequate nutrition will improve Outcome: Not Progressing Goal: Progress toward achieving an optimal weight will improve Outcome: Not Progressing   Problem: Skin Integrity: Goal: Risk for impaired skin integrity will decrease Outcome: Not Progressing   Problem: Tissue Perfusion: Goal: Adequacy of tissue perfusion will improve Outcome: Not Progressing   Problem: Clinical Measurements: Goal: Ability to maintain clinical measurements within normal limits will improve Outcome: Not Progressing Goal: Will remain free from infection Outcome: Not Progressing Goal: Diagnostic test results will improve Outcome: Not Progressing Goal: Respiratory complications will improve Outcome: Not Progressing Goal: Cardiovascular complication will be avoided Outcome: Not Progressing   Problem: Activity: Goal: Risk for activity intolerance will  decrease Outcome: Not Progressing   Problem: Nutrition: Goal: Adequate nutrition will be maintained Outcome: Not Progressing   Problem: Coping: Goal: Level of anxiety will decrease Outcome: Not Progressing   Problem: Elimination: Goal: Will not experience complications related to bowel motility Outcome: Not Progressing Goal: Will not experience complications related to urinary retention Outcome: Not Progressing   Problem: Pain Managment: Goal: General experience of comfort will improve Outcome: Not Progressing   Problem: Safety: Goal: Ability to remain free from injury will improve Outcome: Not Progressing   Problem: Skin Integrity: Goal: Risk for impaired skin integrity will decrease Outcome: Not Progressing   Problem: Education: Goal: Ability to describe self-care measures that may prevent or decrease complications (Diabetes Survival Skills Education) will improve Outcome: Not Progressing   Problem: Coping: Goal: Ability to adjust to condition or change in health will improve Outcome: Not Progressing   Problem: Fluid Volume: Goal: Ability to maintain a balanced intake and output will improve Outcome: Not Progressing   Problem: Health Behavior/Discharge Planning: Goal: Ability to identify and utilize available resources and services will improve Outcome: Not Progressing Goal: Ability to manage health-related needs will improve Outcome: Not Progressing   Problem: Metabolic: Goal: Ability to maintain appropriate glucose levels will improve Outcome: Not Progressing   Problem: Nutritional: Goal: Maintenance of adequate nutrition will improve Outcome: Not Progressing Goal: Progress toward achieving an optimal weight will improve Outcome: Not Progressing   Problem: Skin Integrity: Goal: Risk for impaired skin integrity will decrease Outcome: Not Progressing   Problem: Tissue Perfusion: Goal: Adequacy of tissue perfusion will improve Outcome: Not Progressing    Problem: Education: Goal: Will demonstrate proper wound care and an understanding of methods to prevent future damage Outcome: Not Progressing Goal: Knowledge of disease or condition will improve Outcome: Not Progressing Goal: Knowledge of the prescribed therapeutic regimen will improve Outcome: Not Progressing Goal: Individualized Educational Video(s) Outcome: Not Progressing   Problem: Activity: Goal: Risk for activity intolerance  will decrease Outcome: Not Progressing   Problem: Cardiac: Goal: Will achieve and/or maintain hemodynamic stability Outcome: Not Progressing   Problem: Clinical Measurements: Goal: Postoperative complications will be avoided or minimized Outcome: Not Progressing

## 2023-06-11 NOTE — Progress Notes (Signed)
06/23/2023 Repeat bronch shows blood has now filled back up left mainstem into trachea, now starting to encroach on R mainstem. Nothing further can be done. I called son, tomorrow 9AM family meeting to discuss allowing natural death.  Myrla Halsted MD PCCM

## 2023-06-11 NOTE — Progress Notes (Signed)
ANTICOAGULATION CONSULT NOTE - Follow up Consult  Pharmacy Consult for bivalirudin Indication:  VV ECMO  Allergies  Allergen Reactions   Other Anaphylaxis    Mushrooms  Not listed on MAR    Plavix [Clopidogrel Bisulfate] Other (See Comments)    TTP Not listed on the [facility]   Fleet Enema [Enema] Other (See Comments)    Unknown reaction   Lovenox [Enoxaparin] Other (See Comments)    Unknown reaction   Morphine Other (See Comments)    Unknown reaction   Nsaids Other (See Comments)    Unknown reaction   Hydrocodone-Acetaminophen Itching    Patient Measurements: Height: 5\' 6"  (167.6 cm) Weight: 71.3 kg (157 lb 3 oz) IBW/kg (Calculated) : 63.8 Heparin Dosing Weight: 81.3 kg  Vital Signs: Temp: 98.2 F (36.8 C) (10/05 1048) Temp Source: Core (10/05 1048) BP: 87/52 (10/05 0908) Pulse Rate: 94 (10/05 1230)  Labs: Recent Labs    06/09/23 0355 06/09/23 0402 06/07/2023 0348 06/28/2023 0402 06/29/2023 1040 06/28/2023 1042 07/05/2023 1613 06/27/2023 1621 06/14/2023 1946 06/15/2023 2008 06/25/2023 0406 06/20/2023 0409  HGB 10.1*   < > 9.2*   < > 8.9*   < > 8.7*   < > 10.5*  --  10.2* 10.9*  HCT 28.6*   < > 27.4*   < > 26.5*   < > 26.1*   < > 31.0*  --  29.6* 32.0*  PLT 30*   < > 23*  --  63*  --  49*  --   --   --  30*  --   APTT 46*   < > 45*  --   --   --   --   --   --  32 31  --   LABPROT 18.9*  --  18.3*  --   --   --   --   --   --   --  18.0*  --   INR 1.6*  --  1.5*  --   --   --   --   --   --   --  1.5*  --   CREATININE 1.15   < > 1.06  --   --   --  1.09  --   --   --  1.09  --    < > = values in this interval not displayed.    Estimated Creatinine Clearance: 62.6 mL/min (by C-G formula based on SCr of 1.09 mg/dL).   Assessment: 62 yom underwent [place]/AVR, TV repair, re-implantation of SVG-RCA complicated with vasoplegia and pulmonary edema requiring central [place] ECMO. On Jun 17, 2023 patient underwent transition VA to VV ECMO.   aPTT is slightly subtherapeutic at 45 on  bivalirudin@0 .0.16mg /kg/hr. Hgb 9s stable plt remains low 23 LDH lower 900s Fibrinogen trend down 400s. Small amount of fibrin - stable. Bivailurdin turned off this am with blood tinged respiratory secretions and blood and blood clots removed from BAL, treated with TXA Continue to hold bivalirudin     Goal of Therapy:  aPTT 50-70 seconds Monitor platelets by anticoagulation protocol: Yes   Plan:  Continue to hold bivalirudin for now Reassess daily.  Reece Leader, Colon Flattery, BCCP Clinical Pharmacist  06/25/2023 1:29 PM   [facility] pharmacy phone numbers are listed on [email or web address]

## 2023-06-11 NOTE — Progress Notes (Signed)
Patient ID: [name], male   DOB: [birth year], 62 y.o.   MRN: [id number]     Advanced Heart Failure Rounding Note  PCP-Cardiologist: Kristeen Miss, MD   Subjective:    9/13: OR for Bentall/AVR, TV repair, re-implantation of SVG-RCA.  Post-op vasoplegia and pulmonary edema, required central [facility] ECMO (RA drainage, ascending aorta return.  Post-op TEE EF 55%, mild-moderate RV hypokinesis.  9/14: Blender added to circuit due to high PaO2 9/16: TEE with EF 30%, moderate LVH, moderate RV dysfunction with mild enlargement, stable bioprosthetic aortic valve, repaired TV with mild TR, no endocarditis noted.  9/18: Bronchoscopy with mucus plugs and clots. 06/20/2023: To OR for mediastinal washout.  By TEE, EF 50% with moderate LVH, mild RV enlargement and mild RV dysfunction, stable bioprosthetic aortic valve, repaired TV with mild TR.  He did not tolerate lowering of ECMO flows even with increasing of pressors due to vasoplegia.  9/21: Bronchoscopy  BAL: growing candida and mold 9/24: Decannulated from [facility] ECMO. Chest closed. Transitioned to VV ECMO 10/1: Janina Mayo  - Continued hemoptysis throughout yesterday; bronchoscopy with occlusive clot in the trachea; slow bleeding in the LUL now s/p TXA, epinephrine, cryo.  - CXR with complete loss of lung fields secondary to bleeding and occlusive clots in the airways.  - Rising pressor requirement over the past 24-48h.    Objective:   Weight Range: 71.3 kg Body mass index is 25.37 kg/m.   Vital Signs:   Temp:  [98.2 F (36.8 C)-98.4 F (36.9 C)] 98.4 F (36.9 C) (10/05 0908) Pulse Rate:  [75-111] 97 (10/05 0908) Resp:  [0-21] 20 (10/05 0908) BP: (84-108)/(52-62) 87/52 (10/05 0908) SpO2:  [93 %-100 %] 100 % (10/05 0840) Arterial Line BP: (74-147)/(46-76) 115/68 (10/05 0835) FiO2 (%):  [50 %] 50 % (10/05 0840) Weight:  [71.3 kg] 71.3 kg (10/05 0600) Last BM Date : 06/22/2023  Weight change: Filed Weights   06/09/23 0458 07/04/2023 0500 07/07/2023 0600   Weight: 73.4 kg 72.6 kg 71.3 kg    Intake/Output:   Intake/Output Summary (Last 24 hours) at 07/03/2023 0911 Last data filed at 06/22/2023 0800 Gross per 24 hour  Intake 3772.75 ml  Output 3357.2 ml  Net 415.55 ml      Physical Exam    General:  critically ill; on VV ECMO.  HEENT: + trach Neck: + VV ECMO cannula; no hematoma or bleeding, L internal jugular HD cath Cor: PMI nondisplaced. Tachycardic this AM. No rubs, gallops or murmurs. Lungs: minimal lung sounds bilaterally Abdomen: soft, nontender, nondistended.  Extremities: no cyanosis, clubbing, rash, edema Neuro: sedated   Telemetry   Rates 90s-100s Labs    CBC Recent Labs    07/01/2023 1613 06/23/2023 1621 07/01/2023 0406 06/23/2023 0409  WBC 62.9*  --  52.9*  --   HGB 8.7*   < > 10.2* 10.9*  HCT 26.1*   < > 29.6* 32.0*  MCV 90.6  --  87.8  --   PLT 49*  --  30*  --    < > = values in this interval not displayed.   Basic Metabolic Panel Recent Labs    36/64/40 0355 06/09/23 0402 06/27/2023 1613 07/05/2023 1621 07/05/2023 0406 07/03/2023 0409  NA 134*   < > 133*   < > 133* 135  K 4.1   < > 4.5   < > 4.8 4.4  CL 98   < > 96*  --  95*  --   CO2 20*   < >  23  --  24  --   GLUCOSE 248*   < > 263*  --  316*  --   BUN 51*   < > 46*  --  54*  --   CREATININE 1.15   < > 1.09  --  1.09  --   CALCIUM 8.5*   < > 8.5*  --  8.4*  --   MG 2.6*  --   --   --   --   --   PHOS 3.1   < > 4.5  --  4.1  --    < > = values in this interval not displayed.   Liver Function Tests Recent Labs    06/13/2023 0348 06/09/2023 1613 06/16/2023 0406  AST 97*  --  79*  ALT 15  --  14  ALKPHOS 136*  --  137*  BILITOT 3.4*  --  3.6*  PROT 7.6  --  7.9  ALBUMIN 2.4*  2.4* 2.5* 2.4*  2.4*   BNP: BNP (last 3 results) Recent Labs    05/08/2023 1100  BNP 2,097.0*     Imaging    DG CHEST PORT 1 VIEW  Result Date: 07/02/2023 CLINICAL DATA:  ECMO. EXAM: PORTABLE CHEST 1 VIEW COMPARISON:  One-view chest x-ray 06/09/2023 FINDINGS: A  right IJ catheter terminates in the IVC. Bilateral chest tubes are in place. A second right IJ catheter terminates at the cavoatrial junction. Central gastric tube is in the stomach. The heart size is normal. Diffuse interstitial and airspace opacities are similar the prior exam. Bilateral pleural effusions, left greater than right are also similar. IMPRESSION: 1. Stable support apparatus. 2. Stable diffuse interstitial and airspace opacities. 3. Stable bilateral pleural effusions, left greater than right. Electronically Signed   By: Marin Roberts M.D.   On: 06/23/2023 12:20     Medications:     Scheduled Medications:  sodium chloride   Intravenous Once   sodium chloride   Intravenous Once   sodium chloride   Intravenous Once   artificial tears   Both Eyes Q8H   bisacodyl  10 mg Rectal Daily   Chlorhexidine Gluconate Cloth  6 each Topical Daily   darbepoetin (ARANESP) injection - DIALYSIS  40 mcg Subcutaneous Q Wed-1800    HYDROmorphone (DILAUDID) injection  0.5 mg Intravenous Once   insulin aspart  0-15 Units Subcutaneous Q8H   methylPREDNISolone (SOLU-MEDROL) injection  40 mg Intravenous Q24H   Followed by   [name] ON 06/17/2023] methylPREDNISolone (SOLU-MEDROL) injection  20 mg Intravenous Q24H   mouth rinse  15 mL Mouth Rinse Q2H   pantoprazole (PROTONIX) IV  40 mg Intravenous QHS   sodium chloride flush  3 mL Intravenous Q12H   tranexamic acid  500 mg Nebulization Q8H    Infusions:   prismasol BGK 4/2.5 400 mL/hr at 06/19/2023 0838    prismasol BGK 4/2.5 400 mL/hr at 06/09/2023 2248   sodium chloride Stopped (05/24/23 0032)   sodium chloride Stopped (06/06/23 0237)   albumin human Stopped (05/30/23 0819)   anticoagulant sodium citrate      ceFAZolin (ANCEF) IV Stopped (06/26/2023 2203)   desmopressin (DDAVP) 20 mcg in sodium chloride 0.9 % 50 mL IVPB 20 mcg (06/29/2023 0854)   epinephrine Stopped (06/07/23 2327)   HYDROmorphone 1.5 mg/hr (07/01/2023 0800)   micafungin (MYCAMINE)  200 mg in sodium chloride 0.9 % 100 mL IVPB Stopped (06/22/2023 1931)   norepinephrine (LEVOPHED) Adult infusion 20 mcg/min (07/06/2023 0800)   prismasol BGK 4/2.5  1,500 mL/hr at 06/18/2023 0718   TPN ADULT (ION) 75 mL/hr at 07/05/2023 0800   tranexamic acid     vasopressin 0.04 Units/min (06/14/2023 0900)    PRN Medications: sodium chloride, sodium chloride, acetaminophen, acetaminophen, albumin human, anticoagulant sodium citrate, artificial tears, dextrose, diphenhydrAMINE **OR** diphenhydrAMINE, HYDROmorphone, HYDROmorphone (DILAUDID) injection, meperidine (DEMEROL) injection, midazolam, mouth rinse, oxidized cellulose, sodium chloride flush    Assessment/Plan   1. Post-cardiotomy vasoplegia/shock with failure to wean from CPB: Now on central [facility] ECMO (Jun 17, 2023) with RA drainage/ascending aorta return. Post-op echo with EF 55%, mild-moderate RV dysfunction. Unable to get images yesterday by TTE.  TEE on 9/16 showed EF 30%, moderate LVH, moderate RV dysfunction with mild enlargement, stable bioprosthetic aortic valve, repaired TV with mild TR, no endocarditis noted.  To OR for mediastinal washout on 9/19.  By TEE 9/19, EF 50% with moderate LVH, mild RV enlargement and mild RV dysfunction, stable bioprosthetic aortic valve, repaired TV with mild TR.  He did not tolerate lowering of ECMO flows even with increasing of pressors due to vasoplegia. CXR with clearing left lung, right lung with persistent airspace disease not clearing with CVVH. Switch from [place] ECMO to VV ECMO with chest closure on 06/03/2023 - Continues to have significant hemoptysis now s/p bronch x 2 notable for occlusive clots in the airways and slow bleeding that has not resolved despite multiple efforts from CCM. His CXR has worsened significantly.  - At this time recommend comfort care measures with termination of V-V ECMO support within the next 24h. He has multiorgan failure now with refractory bleeding in the lungs in addition to refractory  thrombocytopenia. Family would like to meet tomorrow AM.   2. Acute hypoxic respiratory failure, post-op: -Blender added to keep PaO2 down. Sweep 4. Suspect extensive PNA, on CVVH for lung clearing  - Continue VV ECMO  - BAL from 9/20 + candida and Aspergillus terreus - On micafungin. Now off amphotericin.  - CT chest with extensive infiltrates. - See above. Refractory hypoxic/hypercapnic respiratory failure with bleeding & clots in the airways.   3. Severe prosthetic AI:  - Due to previous MSSA endocarditis with partial valve dehiscence s/p re-do AVR/Bentall with reimplantation of SVG-RCA and TV repair June 17, 2023.    4. CAD s/p CABG/AVR 2017:   - s/p AVR/Bentall and re-implantation of SVG-RCA 06-17-23. - Off ASA due to thrombocytopenia.  5. ESRD:  - CVVH as above. Keep negative  6. DM2:  - management per CCM  7. Heart block:  - junctional rhythm; tachycardic today to 90s.     8. H/o CVA:  - Prior cerebellar CVA.   9. Thrombocytopenia:  - monitoring. Suspect inflammatory and hemolysis.  - Plt transfusion today.   10. Acute blood loss anemia:  - Post-op, transfuse hgb < 8.   12. FEN: Receiving TPN  13. Liver failure, post-op - stable    Length of Stay: 24  Ezequias Lard, DO  06/19/2023, 9:11 AM  Advanced Heart Failure Team Pager [phone] (M-F; 7a - 5p)  Please contact [facility] for night-coverage after hours (5p -7a ) and weekends on [email or web address]   CRITICAL CARE Performed by: Dorthula Nettles   Total critical care time: 40 minutes  Critical care time was exclusive of separately billable procedures and treating other patients.  Critical care was necessary to treat or prevent imminent or life-threatening deterioration.  Critical care was time spent personally by me on the following activities: development of treatment plan with patient and/or surrogate as well as  nursing, discussions with consultants, evaluation of patient's response to treatment,  examination of patient, obtaining history from patient or surrogate, ordering and performing treatments and interventions, ordering and review of laboratory studies, ordering and review of radiographic studies, pulse oximetry and re-evaluation of patient's condition.

## 2023-06-12 ENCOUNTER — Inpatient Hospital Stay (HOSPITAL_COMMUNITY): Payer: PPO

## 2023-06-12 DIAGNOSIS — R570 Cardiogenic shock: Secondary | ICD-10-CM | POA: Diagnosis not present

## 2023-06-12 DIAGNOSIS — Z7189 Other specified counseling: Secondary | ICD-10-CM | POA: Diagnosis not present

## 2023-06-12 LAB — CBC
HCT: 27.3 % — ABNORMAL LOW (ref 39.0–52.0)
Hemoglobin: 9.6 g/dL — ABNORMAL LOW (ref 13.0–17.0)
MCH: 32.1 pg (ref 26.0–34.0)
MCHC: 35.2 g/dL (ref 30.0–36.0)
MCV: 91.3 fL (ref 80.0–100.0)
Platelets: 29 10*3/uL — CL (ref 150–400)
RBC: 2.99 MIL/uL — ABNORMAL LOW (ref 4.22–5.81)
RDW: 16.9 % — ABNORMAL HIGH (ref 11.5–15.5)
WBC: 62.9 10*3/uL (ref 4.0–10.5)
nRBC: 3.1 % — ABNORMAL HIGH (ref 0.0–0.2)

## 2023-06-12 LAB — BPAM PLATELET PHERESIS
Blood Product Expiration Date: 202410052359
ISSUE DATE / TIME: 202410050857
Unit Type and Rh: 6200

## 2023-06-12 LAB — CG4 I-STAT (LACTIC ACID): Lactic Acid, Venous: 3.2 mmol/L (ref 0.5–1.9)

## 2023-06-12 LAB — GLUCOSE, CAPILLARY
Glucose-Capillary: 295 mg/dL — ABNORMAL HIGH (ref 70–99)
Glucose-Capillary: 305 mg/dL — ABNORMAL HIGH (ref 70–99)
Glucose-Capillary: 334 mg/dL — ABNORMAL HIGH (ref 70–99)
Glucose-Capillary: 348 mg/dL — ABNORMAL HIGH (ref 70–99)
Glucose-Capillary: 362 mg/dL — ABNORMAL HIGH (ref 70–99)

## 2023-06-12 LAB — RENAL FUNCTION PANEL
Albumin: 2.3 g/dL — ABNORMAL LOW (ref 3.5–5.0)
Anion gap: 15 (ref 5–15)
BUN: 54 mg/dL — ABNORMAL HIGH (ref 8–23)
CO2: 23 mmol/L (ref 22–32)
Calcium: 8.2 mg/dL — ABNORMAL LOW (ref 8.9–10.3)
Chloride: 95 mmol/L — ABNORMAL LOW (ref 98–111)
Creatinine, Ser: 1.07 mg/dL (ref 0.61–1.24)
GFR, Estimated: >60 mL/min (ref >60)
Glucose, Bld: 340 mg/dL — ABNORMAL HIGH (ref 70–99)
Phosphorus: 4.9 mg/dL — ABNORMAL HIGH (ref 2.5–4.6)
Potassium: 5.1 mmol/L (ref 3.5–5.1)
Sodium: 133 mmol/L — ABNORMAL LOW (ref 135–145)

## 2023-06-12 LAB — POCT I-STAT 7, (LYTES, BLD GAS, ICA,H+H)
Acid-Base Excess: 1 mmol/L (ref 0.0–2.0)
Bicarbonate: 25.8 mmol/L (ref 20.0–28.0)
Calcium, Ion: 1.13 mmol/L — ABNORMAL LOW (ref 1.15–1.40)
HCT: 30 % — ABNORMAL LOW (ref 39.0–52.0)
Hemoglobin: 10.2 g/dL — ABNORMAL LOW (ref 13.0–17.0)
O2 Saturation: 99 %
Patient temperature: 36.8
Potassium: 4.6 mmol/L (ref 3.5–5.1)
Sodium: 134 mmol/L — ABNORMAL LOW (ref 135–145)
TCO2: 27 mmol/L (ref 22–32)
pCO2 arterial: 42.3 mm[Hg] (ref 32–48)
pH, Arterial: 7.393 (ref 7.35–7.45)
pO2, Arterial: 132 mm[Hg] — ABNORMAL HIGH (ref 83–108)

## 2023-06-12 LAB — PREPARE PLATELET PHERESIS: Unit division: 0

## 2023-06-12 LAB — APTT: aPTT: 31 s (ref 24–36)

## 2023-06-12 MED ORDER — SODIUM CHLORIDE 0.9 % IV SOLN: INTRAVENOUS | Status: DC

## 2023-06-12 MED ORDER — GLYCOPYRROLATE 1 MG PO TABS: 1.0000 mg | ORAL_TABLET | ORAL | Status: DC | PRN

## 2023-06-12 MED ORDER — MIDAZOLAM-SODIUM CHLORIDE 100-0.9 MG/100ML-% IV SOLN
0.5000 mg/h | INTRAVENOUS | Status: DC
  Administered 2023-06-12: 0.5 mg/h via INTRAVENOUS
  Filled 2023-06-12: qty 100

## 2023-06-12 MED ORDER — INSULIN ASPART 100 UNIT/ML IJ SOLN
4.0000 [IU] | INTRAMUSCULAR | Status: DC
  Administered 2023-06-12: 4 [IU] via SUBCUTANEOUS

## 2023-06-12 MED ORDER — ACETAMINOPHEN 650 MG RE SUPP: 650.0000 mg | Freq: Four times a day (QID) | RECTAL | Status: DC | PRN

## 2023-06-12 MED ORDER — POLYVINYL ALCOHOL 1.4 % OP SOLN: 1.0000 [drp] | Freq: Four times a day (QID) | OPHTHALMIC | Status: DC | PRN

## 2023-06-12 MED ORDER — HEPARIN SODIUM (PORCINE) 1000 UNIT/ML IJ SOLN
INTRAMUSCULAR | Status: AC
  Filled 2023-06-12: qty 1

## 2023-06-12 MED ORDER — GLYCOPYRROLATE 0.2 MG/ML IJ SOLN: 0.2000 mg | INTRAMUSCULAR | Status: DC | PRN

## 2023-06-12 MED ORDER — ACETAMINOPHEN 325 MG PO TABS: 650.0000 mg | ORAL_TABLET | Freq: Four times a day (QID) | ORAL | Status: DC | PRN

## 2023-06-12 MED ORDER — MIDAZOLAM HCL 2 MG/2ML IJ SOLN
2.0000 mg | INTRAMUSCULAR | Status: DC | PRN
  Administered 2023-06-12: 2 mg via INTRAVENOUS

## 2023-06-12 MED ORDER — INSULIN ASPART 100 UNIT/ML IJ SOLN
0.0000 [IU] | INTRAMUSCULAR | Status: DC
  Administered 2023-06-12: 11 [IU] via SUBCUTANEOUS

## 2023-06-12 MED ORDER — GLYCOPYRROLATE 0.2 MG/ML IJ SOLN
0.2000 mg | INTRAMUSCULAR | Status: DC | PRN
  Administered 2023-06-12: 0.2 mg via INTRAVENOUS
  Filled 2023-06-12: qty 1

## 2023-06-13 ENCOUNTER — Encounter (HOSPITAL_COMMUNITY): Payer: Self-pay | Admitting: Internal Medicine

## 2023-06-13 LAB — TYPE AND SCREEN
ABO/RH(D): B POS
Antibody Screen: NEGATIVE
Unit division: 0
Unit division: 0
Unit division: 0
Unit division: 0

## 2023-06-13 LAB — BPAM RBC
Blood Product Expiration Date: 202410292359
Blood Product Expiration Date: 202411022359
Blood Product Expiration Date: 202411022359
Blood Product Expiration Date: 202411032359
ISSUE DATE / TIME: 202409270842
Unit Type and Rh: 7300
Unit Type and Rh: 7300
Unit Type and Rh: 7300
Unit Type and Rh: 7300

## 2023-06-14 ENCOUNTER — Encounter (HOSPITAL_COMMUNITY): Payer: Self-pay | Admitting: Internal Medicine

## 2023-06-20 LAB — CULTURE, FUNGUS WITHOUT SMEAR

## 2023-07-08 NOTE — Progress Notes (Signed)
Patient ID: [name], male   DOB: 1961/03/11, 62 y.o.   MRN: [id number]     Advanced Heart Failure Rounding Note  PCP-Cardiologist: Kristeen Miss, MD   Subjective:    9/13: OR for Bentall/AVR, TV repair, re-implantation of SVG-RCA.  Post-op vasoplegia and pulmonary edema, required central [facility] ECMO (RA drainage, ascending aorta return.  Post-op TEE EF 55%, mild-moderate RV hypokinesis.  9/14: Blender added to circuit due to high PaO2 9/16: TEE with EF 30%, moderate LVH, moderate RV dysfunction with mild enlargement, stable bioprosthetic aortic valve, repaired TV with mild TR, no endocarditis noted.  9/18: Bronchoscopy with mucus plugs and clots. May 27, 2023: To OR for mediastinal washout.  By TEE, EF 50% with moderate LVH, mild RV enlargement and mild RV dysfunction, stable bioprosthetic aortic valve, repaired TV with mild TR.  He did not tolerate lowering of ECMO flows even with increasing of pressors due to vasoplegia.  9/21: Bronchoscopy  BAL: growing candida and mold 9/24: Decannulated from [facility] ECMO. Chest closed. Transitioned to VV ECMO 10/1: Trach 10/5: Bronchoscopy with blood filling left mainstem bronchus to trachea.   - CXR with less dense infiltrates on left compared to yesterday. Vt higher at 130 cc.  - He remains on NE 31, vasopressin 0.04 with stable MAP.  Lactate up to 3.2.  - He remains off bivalirudin due to bleeding.  - cefazolin and micafungin ongoing. WBCs up to 63K  CVVH running to keep patient even.   VV ECMO 3600 Flow 3.88 L/min pVen -74 DeltaP 36 7.39/42/132 Lactate 3.2 Plts 29  Objective:   Weight Range: 71.9 kg Body mass index is 25.58 kg/m.   Vital Signs:   Temp:  [98.2 F (36.8 C)-98.4 F (36.9 C)] 98.4 F (36.9 C) (10/06 0400) Pulse Rate:  [84-111] 106 (10/06 0700) Resp:  [0-21] 20 (10/06 0700) BP: (87)/(52) 87/52 (10/05 0908) SpO2:  [93 %-100 %] 100 % (10/06 0700) Arterial Line BP: (82-135)/(50-75) 98/61 (10/06 0700) FiO2 (%):  [50 %] 50 %  (10/06 0603) Weight:  [71.9 kg] 71.9 kg (10/06 0600) Last BM Date : 06/27/2023  Weight change: Filed Weights   07/06/2023 0500 06/30/2023 0600 07/02/2023 0600  Weight: 72.6 kg 71.3 kg 71.9 kg    Intake/Output:   Intake/Output Summary (Last 24 hours) at 07/04/2023 0728 Last data filed at 07/01/2023 0700 Gross per 24 hour  Intake 3707.76 ml  Output 2794 ml  Net 913.76 ml      Physical Exam    General: sedated on vent Neck: Crescent cannula right IJ, no thyromegaly or thyroid nodule.  Lungs: Clear to auscultation bilaterally with normal respiratory effort. CV: Nondisplaced PMI.  Heart regular S1/S2, no S3/S4, no murmur.  No peripheral edema.  No carotid bruit.  Normal pedal pulses.  Abdomen: Soft, nontender, no hepatosplenomegaly, no distention.  Skin: Intact without lesions or rashes.  Neurologic: Sedated on vent Extremities: No clubbing or cyanosis.  HEENT: Normal.    Telemetry   Rates 90s-100s, atrial fibrillation  Labs    CBC Recent Labs    06/26/2023 1621 06/18/2023 0008 06/28/2023 0329  WBC 63.4*  --  62.9*  HGB 9.7* 10.2* 9.6*  HCT 28.1* 30.0* 27.3*  MCV 88.9  --  91.3  PLT 39*  --  29*   Basic Metabolic Panel Recent Labs    59/56/38 1621 06/20/2023 1849 06/22/2023 0008 07/06/2023 0329  NA 132*  --  134* 133*  K 5.5*   < > 4.6 5.1  CL 95*  --   --  95*  CO2 24  --   --  23  GLUCOSE 324*  --   --  340*  BUN 51*  --   --  54*  CREATININE 1.01  --   --  1.07  CALCIUM 8.1*  --   --  8.2*  PHOS 4.5  --   --  4.9*   < > = values in this interval not displayed.   Liver Function Tests Recent Labs    06/07/2023 0348 06/25/2023 1613 07/06/2023 0406 07/06/2023 1621 06/28/2023 0329  AST 97*  --  79*  --   --   ALT 15  --  14  --   --   ALKPHOS 136*  --  137*  --   --   BILITOT 3.4*  --  3.6*  --   --   PROT 7.6  --  7.9  --   --   ALBUMIN 2.4*  2.4*   < > 2.4*  2.4* 2.4* 2.3*   < > = values in this interval not displayed.   BNP: BNP (last 3 results) Recent Labs     06/06/2023 1100  BNP 2,097.0*     Imaging    DG Chest Port 1 View  Result Date: 06/17/2023 CLINICAL DATA:  62 year old male with history of ARDS. EXAM: PORTABLE CHEST 1 VIEW COMPARISON:  Chest x-ray 06/19/2023. FINDINGS: A tracheostomy tube is in place with tip 5.1 cm above the carina. ECMO cannula projecting slightly to the right of the spine extending into the inferior vena cava. Right internal jugular PermCath with tip terminating near the superior cavoatrial junction. A nasogastric tube is seen extending into the stomach, however, the tip of the nasogastric tube extends below the lower margin of the image. Bilateral chest tubes stable in position. Status post median sternotomy for CABG. Near complete opacification of the left hemithorax redemonstrated. Slight improved aeration in the right lung, although there is continued diffuse interstitial prominence and patchy airspace disease throughout the right lung. Pulmonary vasculature and cardiomediastinal contours are completely obscured. IMPRESSION: 1. Postoperative changes and support apparatus, as above. 2. Improved aeration in the right lung, otherwise, unchanged. Electronically Signed   By: Trudie Reed M.D.   On: 06/21/2023 10:28     Medications:     Scheduled Medications:  sodium chloride   Intravenous Once   sodium chloride   Intravenous Once   artificial tears   Both Eyes Q8H   bisacodyl  10 mg Rectal Daily   Chlorhexidine Gluconate Cloth  6 each Topical Daily   darbepoetin (ARANESP) injection - DIALYSIS  40 mcg Subcutaneous Q Wed-1800    HYDROmorphone (DILAUDID) injection  0.5 mg Intravenous Once   insulin aspart  0-15 Units Subcutaneous Q6H   methylPREDNISolone (SOLU-MEDROL) injection  40 mg Intravenous Q24H   Followed by   [name] ON 06/17/2023] methylPREDNISolone (SOLU-MEDROL) injection  20 mg Intravenous Q24H   mouth rinse  15 mL Mouth Rinse Q2H   pantoprazole (PROTONIX) IV  40 mg Intravenous QHS   sodium chloride flush   3 mL Intravenous Q12H   tranexamic acid  500 mg Nebulization Q8H    Infusions:   prismasol BGK 4/2.5 400 mL/hr at 07/02/2023 2111    prismasol BGK 4/2.5 400 mL/hr at 07/02/2023 0001   sodium chloride Stopped (05/24/23 0032)   sodium chloride Stopped (06/06/23 0237)   albumin human Stopped (05/30/23 0819)   anticoagulant sodium citrate      ceFAZolin (ANCEF) IV Stopped (06/17/2023 2143)  epinephrine Stopped (06/07/23 2327)   HYDROmorphone 4 mg/hr (06/18/2023 0700)   micafungin (MYCAMINE) 200 mg in sodium chloride 0.9 % 100 mL IVPB Stopped (07/01/2023 1838)   norepinephrine (LEVOPHED) Adult infusion 31 mcg/min (06/17/2023 0700)   prismasol BGK 2/2.5 dialysis solution 1,500 mL/hr at 06/26/2023 0510   TPN ADULT (ION) 75 mL/hr at 06/27/2023 0700   vasopressin 0.04 Units/min (07/05/2023 0700)    PRN Medications: sodium chloride, sodium chloride, acetaminophen, acetaminophen, albumin human, anticoagulant sodium citrate, artificial tears, dextrose, diphenhydrAMINE **OR** diphenhydrAMINE, HYDROmorphone, HYDROmorphone (DILAUDID) injection, meperidine (DEMEROL) injection, midazolam, mouth rinse, oxidized cellulose, sodium chloride flush    Assessment/Plan   1. Post-cardiotomy vasoplegia/shock with failure to wean from CPB: Now on central [facility] ECMO (05/21/2023) with RA drainage/ascending aorta return. Post-op echo with EF 55%, mild-moderate RV dysfunction. Unable to get images yesterday by TTE.  TEE on 9/16 showed EF 30%, moderate LVH, moderate RV dysfunction with mild enlargement, stable bioprosthetic aortic valve, repaired TV with mild TR, no endocarditis noted.  To OR for mediastinal washout on 06/18/2023.  By TEE 07/05/2023, EF 50% with moderate LVH, mild RV enlargement and mild RV dysfunction, stable bioprosthetic aortic valve, repaired TV with mild TR.  He did not tolerate lowering of ECMO flows even with increasing of pressors due to vasoplegia. CXR with clearing left lung, right lung with persistent airspace disease not  clearing with CVVH. Switch from [place] ECMO to VV ECMO with chest closure on 06/06/2023.  Repeat bronch 10/5 with bleeding in left mainstem bronchus back to trachea.  CXR with whited out left lung yesterday, some improvement today but still low Vt.   Increased pressor requirement, now on NE 31 and vasopressin 0.04 with lactate up to 3.2.  - Continue current pressor support, wean as able.  - Continue to run even via CVVH - Bivalirudin off due to persistent bronchial bleeding.  - At this time recommend comfort care measures. He has multiorgan failure now with refractory bleeding in the lungs in addition to refractory thrombocytopenia and not making progress. Family meeting today.  2. Acute hypoxic respiratory failure, post-op:  Blender added to keep PaO2 down. Sweep 4. Suspect extensive PNA, on CVVH for lung clearing. Developed refractory bronchial bleeding.   - Continue VV ECMO  - BAL from 9/20 + candida and Aspergillus terreus.  On micafungin. Now off amphotericin.  - See above. Refractory hypoxic/hypercapnic respiratory failure with bleeding & clots in the airways.  3. Severe prosthetic AI:  Due to previous MSSA endocarditis with partial valve dehiscence s/p re-do AVR/Bentall with reimplantation of SVG-RCA and TV repair 05/12/2023.   - On cefazolin for MSSA.  4. CAD s/p CABG/AVR 2017:  s/p AVR/Bentall and re-implantation of SVG-RCA 05/23/2023. - Off ASA due to thrombocytopenia. 5. ESRD: CVVH as above. Keep negative 6. DM2:  - management per CCM 7. Junctional rhythm: Noted at times.  8. Atrial fibrillation: Today, appears to be in AF.   9. H/o CVA: Prior cerebellar CVA.  10. Thrombocytopenia: Suspect inflammatory and hemolysis. Refractory, 29K today.  11. Acute blood loss anemia:  - Post-op, transfuse hgb < 8.  12. FEN: Receiving TPN 13. Liver failure, post-op - stable   Length of Stay: 25  [name], MD  06/15/2023, 7:28 AM  Advanced Heart Failure Team Pager [phone] (M-F; 7a - 5p)  Please  contact [facility] for night-coverage after hours (5p -7a ) and weekends on [email or web address]   CRITICAL CARE Performed by: [name]   Total critical care time: 40 minutes  Critical care time was exclusive of separately billable procedures and treating other patients.  Critical care was necessary to treat or prevent imminent or life-threatening deterioration.  Critical care was time spent personally by me on the following activities: development of treatment plan with patient and/or surrogate as well as nursing, discussions with consultants, evaluation of patient's response to treatment, examination of patient, obtaining history from patient or surrogate, ordering and performing treatments and interventions, ordering and review of laboratory studies, ordering and review of radiographic studies, pulse oximetry and re-evaluation of patient's condition.

## 2023-07-08 NOTE — Progress Notes (Signed)
     [address]       [place] [address]             [phone]       Family at the bedside. Transitioning to comfort care  Aideliz Garmany Keane Scrape

## 2023-07-08 NOTE — Death Summary Note (Signed)
DEATH SUMMARY   Patient Details  Name: [name] MRN: [id number] DOB: 1960/09/16  Admission/Discharge Information   Admit Date:  06/13/23  Date of Death: Date of Death: 2023-07-09  Time of Death: Time of Death: 1200  Length of Stay: 2023-07-28  Referring Physician: Marcine Matar, MD   Reason(s) for Hospitalization  Chest pain  Diagnoses  Preliminary cause of death:  Secondary Diagnoses (including complications and co-morbidities):  Principal Problem:   Cardiogenic shock (HCC) Active Problems:   CAD (coronary artery disease)   Type 2 diabetes mellitus without complication, without long-term current use of insulin (HCC)   OSA on CPAP   Symptomatic anemia   Acute on chronic congestive heart failure (HCC)   Prosthetic aortic valve failure   Acute respiratory failure with hypoxia (HCC)   Anemia   Protein-calorie malnutrition, severe   Brief Hospital Course (including significant findings, care, treatment, and services provided and events leading to death)  [name] is a 62 y.o. year old male with history of hypertension, hypercholesterolemia, type 2 diabetes, end-stage renal disease on hemodialysis until a couple weeks ago. He has a known heart history undergoing coronary artery disease and bicuspid aortic valve with severe aortic insufficiency status post CABG x 4 and aortic valve replacement using a 23 mm Edwards magna-ease pericardial valve by Dr. Laneta Simmers on 11/25/2015. 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 He completed antibiotic treatment for this. He presented to [facility] on Jun 13, 2023 with lower extremity edema and inability to lay flat. Workup showed the patient to be anemic with a hemoglobin level of 6.0. CXR was obtained and showed moderate pulmonary vascular congestion. Echocardiogram was obtained and showed a preserved EF of 50-55% however there was concern for a partial dehiscence of the aortic valve annulus with a  significant perivalvular leak which was new since his last Echo performed in May. Due to this he was transferred to [facility] for further care and evaluation. TEE was obtained and confirmed the presence of a partial dehiscence of the aortic valve prosthesis with 2 perivalvular leaks adjacent to the left and right cusps. There was moderate to severe aortic insufficiency. There was also an echodensity in the subvalvular area of the tricuspid valve annulus with severe tricuspid regurgitation. Cardiothoracic surgery consultation was requested and he was evaluated by Dr. Laneta Simmers who felt the patient would require surgical intervention, however this would be extremely high risk. The risks and benefits of the procedure were explained to the patient and he was agreeable to proceed.   Hospital Course:  The patient was treated with CRRT to optimize volume status prior to going to the operating room.  He was started on broad spectrum antibiotics.  He was taken to the operating room on 06/04/2023.  He underwent Redo Sternotomy, Biologic Bentall using a 28 mm Homograft with Re-implantation of right and left coronary artery, Tricuspid Valve Repair, and Endoscopic harvest of greater saphenous vein from his left leg. He was placed on [place] ECMO prior to leaving the operating room. He tolerated the procedure and was taken to the SICU stable condition. He was started on CRRT to remove volume as tolerated. He had expected postoperative blood loss anemia and thrombocytopenia, he was transfused with 1U of PRBCs and 1U of platelets. He was placed on Vancomycin and Ceftriaxone due to previous MSSA endocarditis. Aortic valve leaflet gram stain showed no WBC and cultures showed NGTD. Hemodynamics were stable on Epi 5, NE 1 and Vaso  29.6* 32.0* 33.0* 28.1* 30.0* 27.3*  MCV 89.5  --  90.6  --  87.8  --   --  88.9  --  91.3  PLT 63*  --  49*  --  30*  --   --  39*  --  29*   < > = values in this interval not displayed.   Cardiac Enzymes: No results for input(s): "CKTOTAL", "CKMB", "CKMBINDEX", "TROPONINI" in the last 168 hours. Sepsis Labs: Recent Labs  Lab 06/09/23 0401 06/09/23 1654 06/09/2023 0403 06/21/2023 1040 06/30/2023 1613 06/26/2023 0406 06/15/2023 0407 07/03/2023 1621 07/05/2023 0329 06/16/2023 0332  WBC  --    < >  --    < > 62.9* 52.9*  --  63.4* 62.9*  --   LATICACIDVEN 2.3*  --  2.0*  --   --   --  2.7*  --   --  3.2*   < > = values in this interval not displayed.    Procedures/Operations  Redo median Sternotomy Extracorporeal circulation 3.   Aortic root replacement using a 25mm Homograft valve conduit with reimplantation of coronary arteries. 4.  Extension of RCA vein graft with a segment of greater saphenous vein. 5.   Tricuspid valve annuloplasty by Dr. Laneta Simmers on 05/30/2023.   6.Mediastinal washout 7.  Insertion of left femoral arterial line for BP monitoring by Dr. Laneta Simmers on 05-27-2023  8.Conversion to veno-venous ECMO via right internal jugular Crescent cannula. 9. Central decannulation and closure of chest by Dr. Laneta Simmers on 06/05/2023   Ardelle Balls PA-C 06/13/2023, 8:52 AM  29.6* 32.0* 33.0* 28.1* 30.0* 27.3*  MCV 89.5  --  90.6  --  87.8  --   --  88.9  --  91.3  PLT 63*  --  49*  --  30*  --   --  39*  --  29*   < > = values in this interval not displayed.   Cardiac Enzymes: No results for input(s): "CKTOTAL", "CKMB", "CKMBINDEX", "TROPONINI" in the last 168 hours. Sepsis Labs: Recent Labs  Lab 06/09/23 0401 06/09/23 1654 06/09/2023 0403 06/21/2023 1040 06/30/2023 1613 06/26/2023 0406 06/15/2023 0407 07/03/2023 1621 07/05/2023 0329 06/16/2023 0332  WBC  --    < >  --    < > 62.9* 52.9*  --  63.4* 62.9*  --   LATICACIDVEN 2.3*  --  2.0*  --   --   --  2.7*  --   --  3.2*   < > = values in this interval not displayed.    Procedures/Operations  Redo median Sternotomy Extracorporeal circulation 3.   Aortic root replacement using a 25mm Homograft valve conduit with reimplantation of coronary arteries. 4.  Extension of RCA vein graft with a segment of greater saphenous vein. 5.   Tricuspid valve annuloplasty by Dr. Laneta Simmers on 05/30/2023.   6.Mediastinal washout 7.  Insertion of left femoral arterial line for BP monitoring by Dr. Laneta Simmers on 05-27-2023  8.Conversion to veno-venous ECMO via right internal jugular Crescent cannula. 9. Central decannulation and closure of chest by Dr. Laneta Simmers on 06/05/2023   Ardelle Balls PA-C 06/13/2023, 8:52 AM  89 LV SV Index:   45 LVOT Area:     4.52 cm  LEFT ATRIUM           Index LA Vol (A4C): 54.1 ml 27.21 ml/m  AORTIC VALVE AV Area (Vmax): 1.39 cm AV Vmax:        239.00 cm/s AV Peak Grad:   22.8 mmHg LVOT Vmax:      73.30 cm/s LVOT Vmean:     59.800 cm/s LVOT VTI:       0.196 m  AORTA Ao Root diam: 4.70 cm TRICUSPID VALVE TR Peak grad:   47.1 mmHg TR Vmax:        343.00 cm/s  SHUNTS Systemic VTI:  0.20 m Systemic Diam: 2.40 cm Carolan Clines Electronically signed by Carolan Clines Signature Date/Time: 05/18/2023/3:37:07 PM    Final    DG Chest Port 1 View  Result Date: 05/13/2023 CLINICAL DATA:  SHORTNESS OF BREATH. CHEST PAIN. LOWER  EXTREMITY SWELLING. EXAM: PORTABLE CHEST 1 VIEW COMPARISON:  05/16/2023 FINDINGS: There is a right chest wall dialysis catheter with tips in the distal SVC. Postoperative changes from prior median sternotomy. Heart size appears stable. There is moderate diffuse pulmonary vascular congestion. No frank edema. No pleural fluid or consolidative change. IMPRESSION: Moderate pulmonary vascular congestion. Electronically Signed   By: Signa Kell M.D.   On: 05/21/2023 13:15    Microbiology Recent Results (from the past 240 hour(s))  Blastomyces Antigen     Status: None   Collection Time: 06/03/23  5:47 PM   Specimen: Blood  Result Value Ref Range Status   Blastomyces Antigen None Detected None Detected ng/mL Final    Comment: (NOTE) Reference Interval: None Detected Reportable Range: 0.31 ng/mL - 20.00 ng/mL Results above 20.00 ng/mL are reported as 'Positive, Above the Limit of Quantification' This test was developed and its performance characteristics determined by [employer]. It has not been cleared or approved by the FDA; however, FDA clearance or approval is not currently required for clinical use. The results are not intended to be used as the sole means for clinical diagnosis or patient decisions.    Specimen Type SERUM  Final    Comment: (NOTE) Performed At: [facility] [address] [place], [place] [id number] Roxanne Gates MD WU:[id number]   Culture, BAL-quantitative w Gram Stain     Status: Abnormal   Collection Time: 06/07/23 12:15 PM   Specimen: Bronchoalveolar Lavage; Respiratory  Result Value Ref Range Status   Specimen Description BRONCHIAL ALVEOLAR LAVAGE  Final   Special Requests NONE  Final   Gram Stain   Final    MODERATE WBC PRESENT, PREDOMINANTLY PMN NO ORGANISMS SEEN    Culture (A)  Final    3,000 COLONIES/mL FUNGUS ISOLATED CALL TO SEND OUT FOR IDENTIFICATION [phone] Performed at [facility], [address]  [place], [place] [address]    Report Status 06/09/2023 FINAL  Final    Lab Basic Metabolic Panel: Recent Labs  Lab 06/07/23 0354 06/07/23 0727 06/09/23 0355 06/09/23 0402 06/20/2023 0348 06/28/2023 0402 06/09/2023 1613 06/14/2023 1621 06-21-2023 0406 06-21-23 0409 06-21-23 1152 06/21/2023 1621 Jun 21, 2023 1849 06/24/2023 0008 06/20/2023 0329  NA 134*   < > 134*   < > 134*   < > 133*   < > 133* 135 135 132*  --  134* 133*  K 4.1   < > 4.1   < > 3.7   < > 4.5   < > 4.8 4.4  normal in size. Pericardium: Trivial pericardial effusion is present. Mitral Valve: The mitral valve is normal in structure. Trivial mitral valve regurgitation. No evidence of mitral valve stenosis. Tricuspid Valve: The tricuspid valve is has been repaired/replaced. Tricuspid valve regurgitation is mild. Aortic Valve: The aortic valve has been repaired/replaced. Aortic valve regurgitation is trivial. No aortic stenosis is present. There is a 25 mm Cryo AV conduit/AA replacement valve present in the aortic position. Procedure Date: 05/13/2023. Pulmonic Valve: The pulmonic valve was normal in structure. Pulmonic valve regurgitation is not visualized. Aorta: The aortic root/ascending aorta has been repaired/replaced. IAS/Shunts: No PFO/ASD by color doppler. Additional Comments: Spectral Doppler performed. TRICUSPID VALVE TR Peak grad:   26.4 mmHg TR Vmax:        257.00 cm/s Dalton McleanMD Electronically signed by Wilfred Lacy Signature Date/Time: 05/23/2023/4:07:34 PM    Final    DG Abd Portable 1V  Result Date: 05/23/2023 CLINICAL DATA:  Feeding tube placement. EXAM: PORTABLE ABDOMEN - 1 VIEW COMPARISON:  None Available. FINDINGS: Tip of the weighted enteric tube in the right upper quadrant in the region of the distal stomach or proximal duodenum. Portions of additional support apparatus are partially included in the field of view. Relative paucity bowel  gas in the included abdomen. IMPRESSION: Tip of the weighted enteric tube in the right upper quadrant in the region of the distal stomach or proximal duodenum. Electronically Signed   By: Narda Rutherford M.D.   On: 05/23/2023 15:05   DG CHEST PORT 1 VIEW  Result Date: 05/23/2023 CLINICAL DATA:  Patient on ECMO EXAM: PORTABLE CHEST 1 VIEW COMPARISON:  Prior chest x-ray 05/22/2023 FINDINGS: The patient is intubated. The tip of the endotracheal tube is 2.2 cm above the carina. ECMO apparatus are in place with 2 large bore cannula overlie the thoracic inlet in unchanged position. There are currently clamped with hemostats. Right IJ approach tunneled hemodialysis catheter with the tip at the cavoatrial junction. Left IJ approach vascular sheath conveys a Swan-Ganz catheter into the heart. The tip of the Swan overlies the main pulmonary outflow tract. Additional ECMO tubing overlies the right atrium. Gastric tube in the stomach. Bilateral chest tubes are in place. Improving pulmonary edema with asymmetric improvement in the left lung versus the right. Persistent near confluent airspace opacity throughout the right lung. No pneumothorax. Stable cardiomegaly. IMPRESSION: 1. Interval change in position of the Swan-Ganz catheter which now overlies the main pulmonary outflow tract. 2. Other support apparatus in stable and satisfactory position. 3. Slightly improved aeration in the left lung with persistent near confluent airspace opacity throughout the right lung. 4. No pneumothorax. Electronically Signed   By: Malachy Moan M.D.   On: 05/23/2023 08:28   DG CHEST PORT 1 VIEW  Result Date: 05/22/2023 CLINICAL DATA:  ECMO EXAM: PORTABLE CHEST - 1 VIEW COMPARISON:  the previous day's study FINDINGS: Endotracheal tube tip less than 2 cm above carina. Gastric tube to the decompressed stomach. Stable right IJ hemodialysis catheter, bilateral chest tubes, right atrial and Left subclavian cannulae. The left IJ Swan-Ganz  is retracted to the right atrium. Diffuse of the earlier opacities throughout the lungs as before. No pneumothorax or significant effusion. Heart size and mediastinal contours are within normal limits. Visualized bones unremarkable. IMPRESSION: 1. Endotracheal tube tip less than 2 cm above carina. 2. Retraction of Swan-Ganz catheter to the right atrium. 3. Diffuse bilateral pulmonary opacities as before. Electronically Signed   By: Corlis Leak M.D.   On: 05/22/2023 08:51  29.6* 32.0* 33.0* 28.1* 30.0* 27.3*  MCV 89.5  --  90.6  --  87.8  --   --  88.9  --  91.3  PLT 63*  --  49*  --  30*  --   --  39*  --  29*   < > = values in this interval not displayed.   Cardiac Enzymes: No results for input(s): "CKTOTAL", "CKMB", "CKMBINDEX", "TROPONINI" in the last 168 hours. Sepsis Labs: Recent Labs  Lab 06/09/23 0401 06/09/23 1654 06/09/2023 0403 06/21/2023 1040 06/30/2023 1613 06/26/2023 0406 06/15/2023 0407 07/03/2023 1621 07/05/2023 0329 06/16/2023 0332  WBC  --    < >  --    < > 62.9* 52.9*  --  63.4* 62.9*  --   LATICACIDVEN 2.3*  --  2.0*  --   --   --  2.7*  --   --  3.2*   < > = values in this interval not displayed.    Procedures/Operations  Redo median Sternotomy Extracorporeal circulation 3.   Aortic root replacement using a 25mm Homograft valve conduit with reimplantation of coronary arteries. 4.  Extension of RCA vein graft with a segment of greater saphenous vein. 5.   Tricuspid valve annuloplasty by Dr. Laneta Simmers on 05/30/2023.   6.Mediastinal washout 7.  Insertion of left femoral arterial line for BP monitoring by Dr. Laneta Simmers on 05-27-2023  8.Conversion to veno-venous ECMO via right internal jugular Crescent cannula. 9. Central decannulation and closure of chest by Dr. Laneta Simmers on 06/05/2023   Ardelle Balls PA-C 06/13/2023, 8:52 AM  normal in size. Pericardium: Trivial pericardial effusion is present. Mitral Valve: The mitral valve is normal in structure. Trivial mitral valve regurgitation. No evidence of mitral valve stenosis. Tricuspid Valve: The tricuspid valve is has been repaired/replaced. Tricuspid valve regurgitation is mild. Aortic Valve: The aortic valve has been repaired/replaced. Aortic valve regurgitation is trivial. No aortic stenosis is present. There is a 25 mm Cryo AV conduit/AA replacement valve present in the aortic position. Procedure Date: 05/13/2023. Pulmonic Valve: The pulmonic valve was normal in structure. Pulmonic valve regurgitation is not visualized. Aorta: The aortic root/ascending aorta has been repaired/replaced. IAS/Shunts: No PFO/ASD by color doppler. Additional Comments: Spectral Doppler performed. TRICUSPID VALVE TR Peak grad:   26.4 mmHg TR Vmax:        257.00 cm/s Dalton McleanMD Electronically signed by Wilfred Lacy Signature Date/Time: 05/23/2023/4:07:34 PM    Final    DG Abd Portable 1V  Result Date: 05/23/2023 CLINICAL DATA:  Feeding tube placement. EXAM: PORTABLE ABDOMEN - 1 VIEW COMPARISON:  None Available. FINDINGS: Tip of the weighted enteric tube in the right upper quadrant in the region of the distal stomach or proximal duodenum. Portions of additional support apparatus are partially included in the field of view. Relative paucity bowel  gas in the included abdomen. IMPRESSION: Tip of the weighted enteric tube in the right upper quadrant in the region of the distal stomach or proximal duodenum. Electronically Signed   By: Narda Rutherford M.D.   On: 05/23/2023 15:05   DG CHEST PORT 1 VIEW  Result Date: 05/23/2023 CLINICAL DATA:  Patient on ECMO EXAM: PORTABLE CHEST 1 VIEW COMPARISON:  Prior chest x-ray 05/22/2023 FINDINGS: The patient is intubated. The tip of the endotracheal tube is 2.2 cm above the carina. ECMO apparatus are in place with 2 large bore cannula overlie the thoracic inlet in unchanged position. There are currently clamped with hemostats. Right IJ approach tunneled hemodialysis catheter with the tip at the cavoatrial junction. Left IJ approach vascular sheath conveys a Swan-Ganz catheter into the heart. The tip of the Swan overlies the main pulmonary outflow tract. Additional ECMO tubing overlies the right atrium. Gastric tube in the stomach. Bilateral chest tubes are in place. Improving pulmonary edema with asymmetric improvement in the left lung versus the right. Persistent near confluent airspace opacity throughout the right lung. No pneumothorax. Stable cardiomegaly. IMPRESSION: 1. Interval change in position of the Swan-Ganz catheter which now overlies the main pulmonary outflow tract. 2. Other support apparatus in stable and satisfactory position. 3. Slightly improved aeration in the left lung with persistent near confluent airspace opacity throughout the right lung. 4. No pneumothorax. Electronically Signed   By: Malachy Moan M.D.   On: 05/23/2023 08:28   DG CHEST PORT 1 VIEW  Result Date: 05/22/2023 CLINICAL DATA:  ECMO EXAM: PORTABLE CHEST - 1 VIEW COMPARISON:  the previous day's study FINDINGS: Endotracheal tube tip less than 2 cm above carina. Gastric tube to the decompressed stomach. Stable right IJ hemodialysis catheter, bilateral chest tubes, right atrial and Left subclavian cannulae. The left IJ Swan-Ganz  is retracted to the right atrium. Diffuse of the earlier opacities throughout the lungs as before. No pneumothorax or significant effusion. Heart size and mediastinal contours are within normal limits. Visualized bones unremarkable. IMPRESSION: 1. Endotracheal tube tip less than 2 cm above carina. 2. Retraction of Swan-Ganz catheter to the right atrium. 3. Diffuse bilateral pulmonary opacities as before. Electronically Signed   By: Corlis Leak M.D.   On: 05/22/2023 08:51  89 LV SV Index:   45 LVOT Area:     4.52 cm  LEFT ATRIUM           Index LA Vol (A4C): 54.1 ml 27.21 ml/m  AORTIC VALVE AV Area (Vmax): 1.39 cm AV Vmax:        239.00 cm/s AV Peak Grad:   22.8 mmHg LVOT Vmax:      73.30 cm/s LVOT Vmean:     59.800 cm/s LVOT VTI:       0.196 m  AORTA Ao Root diam: 4.70 cm TRICUSPID VALVE TR Peak grad:   47.1 mmHg TR Vmax:        343.00 cm/s  SHUNTS Systemic VTI:  0.20 m Systemic Diam: 2.40 cm Carolan Clines Electronically signed by Carolan Clines Signature Date/Time: 05/18/2023/3:37:07 PM    Final    DG Chest Port 1 View  Result Date: 05/13/2023 CLINICAL DATA:  SHORTNESS OF BREATH. CHEST PAIN. LOWER  EXTREMITY SWELLING. EXAM: PORTABLE CHEST 1 VIEW COMPARISON:  05/16/2023 FINDINGS: There is a right chest wall dialysis catheter with tips in the distal SVC. Postoperative changes from prior median sternotomy. Heart size appears stable. There is moderate diffuse pulmonary vascular congestion. No frank edema. No pleural fluid or consolidative change. IMPRESSION: Moderate pulmonary vascular congestion. Electronically Signed   By: Signa Kell M.D.   On: 05/21/2023 13:15    Microbiology Recent Results (from the past 240 hour(s))  Blastomyces Antigen     Status: None   Collection Time: 06/03/23  5:47 PM   Specimen: Blood  Result Value Ref Range Status   Blastomyces Antigen None Detected None Detected ng/mL Final    Comment: (NOTE) Reference Interval: None Detected Reportable Range: 0.31 ng/mL - 20.00 ng/mL Results above 20.00 ng/mL are reported as 'Positive, Above the Limit of Quantification' This test was developed and its performance characteristics determined by [employer]. It has not been cleared or approved by the FDA; however, FDA clearance or approval is not currently required for clinical use. The results are not intended to be used as the sole means for clinical diagnosis or patient decisions.    Specimen Type SERUM  Final    Comment: (NOTE) Performed At: [facility] [address] [place], [place] [id number] Roxanne Gates MD WU:[id number]   Culture, BAL-quantitative w Gram Stain     Status: Abnormal   Collection Time: 06/07/23 12:15 PM   Specimen: Bronchoalveolar Lavage; Respiratory  Result Value Ref Range Status   Specimen Description BRONCHIAL ALVEOLAR LAVAGE  Final   Special Requests NONE  Final   Gram Stain   Final    MODERATE WBC PRESENT, PREDOMINANTLY PMN NO ORGANISMS SEEN    Culture (A)  Final    3,000 COLONIES/mL FUNGUS ISOLATED CALL TO SEND OUT FOR IDENTIFICATION [phone] Performed at [facility], [address]  [place], [place] [address]    Report Status 06/09/2023 FINAL  Final    Lab Basic Metabolic Panel: Recent Labs  Lab 06/07/23 0354 06/07/23 0727 06/09/23 0355 06/09/23 0402 06/20/2023 0348 06/28/2023 0402 06/09/2023 1613 06/14/2023 1621 06-21-2023 0406 06-21-23 0409 06-21-23 1152 06/21/2023 1621 Jun 21, 2023 1849 06/24/2023 0008 06/20/2023 0329  NA 134*   < > 134*   < > 134*   < > 133*   < > 133* 135 135 132*  --  134* 133*  K 4.1   < > 4.1   < > 3.7   < > 4.5   < > 4.8 4.4  catheter, right dialysis catheter, ECMO, bilateral chest tubes, NG tube remain in place, unchanged. Prior CABG. Heart normal size. Bilateral airspace disease, most confluent in the right lung unchanged. No effusion or pneumothorax. IMPRESSION: Support devices stable. Stable bilateral airspace disease, right greater than left. Electronically Signed   By: Charlett Nose M.D.   On: 06/01/2023 10:47   DG CHEST PORT 1 VIEW  Result Date: 05/15/2023 CLINICAL DATA:  Chest pain, history of ECMO. EXAM: PORTABLE CHEST 1 VIEW COMPARISON:  Same day. FINDINGS: The heart size and mediastinal contours are within normal limits. Right internal jugular dialysis catheter is again noted. Endotracheal tube is in good position. Left internal jugular catheter is unchanged. Bilateral chest tubes are noted without pneumothorax. Status post coronary bypass graft. ECMO device is noted. Bilateral lung opacities are again noted, right greater than left. The visualized skeletal structures are unremarkable. IMPRESSION: Stable support apparatus and bilateral lung opacities as noted above. Electronically Signed   By: Lupita Raider M.D.   On: 06/06/2023 19:51   DG Abd 1 View  Result Date: 05/27/2023 CLINICAL DATA:  Orogastric tube placement. EXAM: ABDOMEN - 1 VIEW COMPARISON:  May 29, 2023. FINDINGS: Distal tip of nasogastric tube is seen in expected position of proximal stomach. IMPRESSION: Distal tip of nasogastric tube is seen in expected position of proximal stomach. Electronically Signed   By: Lupita Raider M.D.   On: 05/21/2023 19:49   ECHO INTRAOPERATIVE TEE  Result Date: 05/28/2023  *INTRAOPERATIVE TRANSESOPHAGEAL REPORT *  Patient Name:   [name]  Date of Exam: 06/01/2023 Medical Rec #:  [id number]      Height:       66.0  in Accession #:    [id number]     Weight:       186.7 lb Date of Birth:  1961/07/14       BSA:          1.94 m Patient Age:    62 years       BP:           138/80 mmHg Patient Gender: M              HR:           68 bpm. Exam Location:  Anesthesiology Transesophogeal exam was perform intraoperatively during surgical procedure. Patient was closely monitored under general anesthesia during the entirety of examination. Indications:     VA ECMO conversion to VV ECMO Performing Phys: [id number] Alleen Borne Diagnosing Phys: Jairo Ben MD Complications: No known complications during this procedure. POST-OP IMPRESSIONS limited post exchange to VV ECMO _ Left Ventricle: The left ventricular function is essentially unchanged from pre-op exam. Overall EF 50-55%, visually with mild global hypokinesis. _ Right Ventricle: The right ventricular function remains mildly reduced, unchanged from pre-op exam. _ Aortic Valve: The aortic valve homograft appears unchanged from pre-op images. There is trivial regurgitation. _ Mitral Valve: The mitral valve function appears unchanged from pre-op images. _ Tricuspid Valve: There is mild tricuspid regurgitation.  -Right atrium: The old VA cannula has been removed, A large cannula is well positioned in the RA, with turbulent flow evident. PRE-OP FINDINGS  Left Ventricle: The left ventricle has low normal systolic function, with an ejection fraction of 50-55% on VA ECMO. Measured EF 54%. The cavity size was normal. Left ventrical mild global hypokinesis without regional wall motion abnormalities. There is  no left ventricular hypertrophy. Left ventricular diastolic function was not evaluated. Right Ventricle: The  normal in size. Pericardium: Trivial pericardial effusion is present. Mitral Valve: The mitral valve is normal in structure. Trivial mitral valve regurgitation. No evidence of mitral valve stenosis. Tricuspid Valve: The tricuspid valve is has been repaired/replaced. Tricuspid valve regurgitation is mild. Aortic Valve: The aortic valve has been repaired/replaced. Aortic valve regurgitation is trivial. No aortic stenosis is present. There is a 25 mm Cryo AV conduit/AA replacement valve present in the aortic position. Procedure Date: 05/13/2023. Pulmonic Valve: The pulmonic valve was normal in structure. Pulmonic valve regurgitation is not visualized. Aorta: The aortic root/ascending aorta has been repaired/replaced. IAS/Shunts: No PFO/ASD by color doppler. Additional Comments: Spectral Doppler performed. TRICUSPID VALVE TR Peak grad:   26.4 mmHg TR Vmax:        257.00 cm/s Dalton McleanMD Electronically signed by Wilfred Lacy Signature Date/Time: 05/23/2023/4:07:34 PM    Final    DG Abd Portable 1V  Result Date: 05/23/2023 CLINICAL DATA:  Feeding tube placement. EXAM: PORTABLE ABDOMEN - 1 VIEW COMPARISON:  None Available. FINDINGS: Tip of the weighted enteric tube in the right upper quadrant in the region of the distal stomach or proximal duodenum. Portions of additional support apparatus are partially included in the field of view. Relative paucity bowel  gas in the included abdomen. IMPRESSION: Tip of the weighted enteric tube in the right upper quadrant in the region of the distal stomach or proximal duodenum. Electronically Signed   By: Narda Rutherford M.D.   On: 05/23/2023 15:05   DG CHEST PORT 1 VIEW  Result Date: 05/23/2023 CLINICAL DATA:  Patient on ECMO EXAM: PORTABLE CHEST 1 VIEW COMPARISON:  Prior chest x-ray 05/22/2023 FINDINGS: The patient is intubated. The tip of the endotracheal tube is 2.2 cm above the carina. ECMO apparatus are in place with 2 large bore cannula overlie the thoracic inlet in unchanged position. There are currently clamped with hemostats. Right IJ approach tunneled hemodialysis catheter with the tip at the cavoatrial junction. Left IJ approach vascular sheath conveys a Swan-Ganz catheter into the heart. The tip of the Swan overlies the main pulmonary outflow tract. Additional ECMO tubing overlies the right atrium. Gastric tube in the stomach. Bilateral chest tubes are in place. Improving pulmonary edema with asymmetric improvement in the left lung versus the right. Persistent near confluent airspace opacity throughout the right lung. No pneumothorax. Stable cardiomegaly. IMPRESSION: 1. Interval change in position of the Swan-Ganz catheter which now overlies the main pulmonary outflow tract. 2. Other support apparatus in stable and satisfactory position. 3. Slightly improved aeration in the left lung with persistent near confluent airspace opacity throughout the right lung. 4. No pneumothorax. Electronically Signed   By: Malachy Moan M.D.   On: 05/23/2023 08:28   DG CHEST PORT 1 VIEW  Result Date: 05/22/2023 CLINICAL DATA:  ECMO EXAM: PORTABLE CHEST - 1 VIEW COMPARISON:  the previous day's study FINDINGS: Endotracheal tube tip less than 2 cm above carina. Gastric tube to the decompressed stomach. Stable right IJ hemodialysis catheter, bilateral chest tubes, right atrial and Left subclavian cannulae. The left IJ Swan-Ganz  is retracted to the right atrium. Diffuse of the earlier opacities throughout the lungs as before. No pneumothorax or significant effusion. Heart size and mediastinal contours are within normal limits. Visualized bones unremarkable. IMPRESSION: 1. Endotracheal tube tip less than 2 cm above carina. 2. Retraction of Swan-Ganz catheter to the right atrium. 3. Diffuse bilateral pulmonary opacities as before. Electronically Signed   By: Corlis Leak M.D.   On: 05/22/2023 08:51  normal in size. Pericardium: Trivial pericardial effusion is present. Mitral Valve: The mitral valve is normal in structure. Trivial mitral valve regurgitation. No evidence of mitral valve stenosis. Tricuspid Valve: The tricuspid valve is has been repaired/replaced. Tricuspid valve regurgitation is mild. Aortic Valve: The aortic valve has been repaired/replaced. Aortic valve regurgitation is trivial. No aortic stenosis is present. There is a 25 mm Cryo AV conduit/AA replacement valve present in the aortic position. Procedure Date: 05/13/2023. Pulmonic Valve: The pulmonic valve was normal in structure. Pulmonic valve regurgitation is not visualized. Aorta: The aortic root/ascending aorta has been repaired/replaced. IAS/Shunts: No PFO/ASD by color doppler. Additional Comments: Spectral Doppler performed. TRICUSPID VALVE TR Peak grad:   26.4 mmHg TR Vmax:        257.00 cm/s Dalton McleanMD Electronically signed by Wilfred Lacy Signature Date/Time: 05/23/2023/4:07:34 PM    Final    DG Abd Portable 1V  Result Date: 05/23/2023 CLINICAL DATA:  Feeding tube placement. EXAM: PORTABLE ABDOMEN - 1 VIEW COMPARISON:  None Available. FINDINGS: Tip of the weighted enteric tube in the right upper quadrant in the region of the distal stomach or proximal duodenum. Portions of additional support apparatus are partially included in the field of view. Relative paucity bowel  gas in the included abdomen. IMPRESSION: Tip of the weighted enteric tube in the right upper quadrant in the region of the distal stomach or proximal duodenum. Electronically Signed   By: Narda Rutherford M.D.   On: 05/23/2023 15:05   DG CHEST PORT 1 VIEW  Result Date: 05/23/2023 CLINICAL DATA:  Patient on ECMO EXAM: PORTABLE CHEST 1 VIEW COMPARISON:  Prior chest x-ray 05/22/2023 FINDINGS: The patient is intubated. The tip of the endotracheal tube is 2.2 cm above the carina. ECMO apparatus are in place with 2 large bore cannula overlie the thoracic inlet in unchanged position. There are currently clamped with hemostats. Right IJ approach tunneled hemodialysis catheter with the tip at the cavoatrial junction. Left IJ approach vascular sheath conveys a Swan-Ganz catheter into the heart. The tip of the Swan overlies the main pulmonary outflow tract. Additional ECMO tubing overlies the right atrium. Gastric tube in the stomach. Bilateral chest tubes are in place. Improving pulmonary edema with asymmetric improvement in the left lung versus the right. Persistent near confluent airspace opacity throughout the right lung. No pneumothorax. Stable cardiomegaly. IMPRESSION: 1. Interval change in position of the Swan-Ganz catheter which now overlies the main pulmonary outflow tract. 2. Other support apparatus in stable and satisfactory position. 3. Slightly improved aeration in the left lung with persistent near confluent airspace opacity throughout the right lung. 4. No pneumothorax. Electronically Signed   By: Malachy Moan M.D.   On: 05/23/2023 08:28   DG CHEST PORT 1 VIEW  Result Date: 05/22/2023 CLINICAL DATA:  ECMO EXAM: PORTABLE CHEST - 1 VIEW COMPARISON:  the previous day's study FINDINGS: Endotracheal tube tip less than 2 cm above carina. Gastric tube to the decompressed stomach. Stable right IJ hemodialysis catheter, bilateral chest tubes, right atrial and Left subclavian cannulae. The left IJ Swan-Ganz  is retracted to the right atrium. Diffuse of the earlier opacities throughout the lungs as before. No pneumothorax or significant effusion. Heart size and mediastinal contours are within normal limits. Visualized bones unremarkable. IMPRESSION: 1. Endotracheal tube tip less than 2 cm above carina. 2. Retraction of Swan-Ganz catheter to the right atrium. 3. Diffuse bilateral pulmonary opacities as before. Electronically Signed   By: Corlis Leak M.D.   On: 05/22/2023 08:51  29.6* 32.0* 33.0* 28.1* 30.0* 27.3*  MCV 89.5  --  90.6  --  87.8  --   --  88.9  --  91.3  PLT 63*  --  49*  --  30*  --   --  39*  --  29*   < > = values in this interval not displayed.   Cardiac Enzymes: No results for input(s): "CKTOTAL", "CKMB", "CKMBINDEX", "TROPONINI" in the last 168 hours. Sepsis Labs: Recent Labs  Lab 06/09/23 0401 06/09/23 1654 06/09/2023 0403 06/21/2023 1040 06/30/2023 1613 06/26/2023 0406 06/15/2023 0407 07/03/2023 1621 07/05/2023 0329 06/16/2023 0332  WBC  --    < >  --    < > 62.9* 52.9*  --  63.4* 62.9*  --   LATICACIDVEN 2.3*  --  2.0*  --   --   --  2.7*  --   --  3.2*   < > = values in this interval not displayed.    Procedures/Operations  Redo median Sternotomy Extracorporeal circulation 3.   Aortic root replacement using a 25mm Homograft valve conduit with reimplantation of coronary arteries. 4.  Extension of RCA vein graft with a segment of greater saphenous vein. 5.   Tricuspid valve annuloplasty by Dr. Laneta Simmers on 05/30/2023.   6.Mediastinal washout 7.  Insertion of left femoral arterial line for BP monitoring by Dr. Laneta Simmers on 05-27-2023  8.Conversion to veno-venous ECMO via right internal jugular Crescent cannula. 9. Central decannulation and closure of chest by Dr. Laneta Simmers on 06/05/2023   Ardelle Balls PA-C 06/13/2023, 8:52 AM  29.6* 32.0* 33.0* 28.1* 30.0* 27.3*  MCV 89.5  --  90.6  --  87.8  --   --  88.9  --  91.3  PLT 63*  --  49*  --  30*  --   --  39*  --  29*   < > = values in this interval not displayed.   Cardiac Enzymes: No results for input(s): "CKTOTAL", "CKMB", "CKMBINDEX", "TROPONINI" in the last 168 hours. Sepsis Labs: Recent Labs  Lab 06/09/23 0401 06/09/23 1654 06/09/2023 0403 06/21/2023 1040 06/30/2023 1613 06/26/2023 0406 06/15/2023 0407 07/03/2023 1621 07/05/2023 0329 06/16/2023 0332  WBC  --    < >  --    < > 62.9* 52.9*  --  63.4* 62.9*  --   LATICACIDVEN 2.3*  --  2.0*  --   --   --  2.7*  --   --  3.2*   < > = values in this interval not displayed.    Procedures/Operations  Redo median Sternotomy Extracorporeal circulation 3.   Aortic root replacement using a 25mm Homograft valve conduit with reimplantation of coronary arteries. 4.  Extension of RCA vein graft with a segment of greater saphenous vein. 5.   Tricuspid valve annuloplasty by Dr. Laneta Simmers on 05/30/2023.   6.Mediastinal washout 7.  Insertion of left femoral arterial line for BP monitoring by Dr. Laneta Simmers on 05-27-2023  8.Conversion to veno-venous ECMO via right internal jugular Crescent cannula. 9. Central decannulation and closure of chest by Dr. Laneta Simmers on 06/05/2023   Ardelle Balls PA-C 06/13/2023, 8:52 AM  89 LV SV Index:   45 LVOT Area:     4.52 cm  LEFT ATRIUM           Index LA Vol (A4C): 54.1 ml 27.21 ml/m  AORTIC VALVE AV Area (Vmax): 1.39 cm AV Vmax:        239.00 cm/s AV Peak Grad:   22.8 mmHg LVOT Vmax:      73.30 cm/s LVOT Vmean:     59.800 cm/s LVOT VTI:       0.196 m  AORTA Ao Root diam: 4.70 cm TRICUSPID VALVE TR Peak grad:   47.1 mmHg TR Vmax:        343.00 cm/s  SHUNTS Systemic VTI:  0.20 m Systemic Diam: 2.40 cm Carolan Clines Electronically signed by Carolan Clines Signature Date/Time: 05/18/2023/3:37:07 PM    Final    DG Chest Port 1 View  Result Date: 05/13/2023 CLINICAL DATA:  SHORTNESS OF BREATH. CHEST PAIN. LOWER  EXTREMITY SWELLING. EXAM: PORTABLE CHEST 1 VIEW COMPARISON:  05/16/2023 FINDINGS: There is a right chest wall dialysis catheter with tips in the distal SVC. Postoperative changes from prior median sternotomy. Heart size appears stable. There is moderate diffuse pulmonary vascular congestion. No frank edema. No pleural fluid or consolidative change. IMPRESSION: Moderate pulmonary vascular congestion. Electronically Signed   By: Signa Kell M.D.   On: 05/21/2023 13:15    Microbiology Recent Results (from the past 240 hour(s))  Blastomyces Antigen     Status: None   Collection Time: 06/03/23  5:47 PM   Specimen: Blood  Result Value Ref Range Status   Blastomyces Antigen None Detected None Detected ng/mL Final    Comment: (NOTE) Reference Interval: None Detected Reportable Range: 0.31 ng/mL - 20.00 ng/mL Results above 20.00 ng/mL are reported as 'Positive, Above the Limit of Quantification' This test was developed and its performance characteristics determined by [employer]. It has not been cleared or approved by the FDA; however, FDA clearance or approval is not currently required for clinical use. The results are not intended to be used as the sole means for clinical diagnosis or patient decisions.    Specimen Type SERUM  Final    Comment: (NOTE) Performed At: [facility] [address] [place], [place] [id number] Roxanne Gates MD WU:[id number]   Culture, BAL-quantitative w Gram Stain     Status: Abnormal   Collection Time: 06/07/23 12:15 PM   Specimen: Bronchoalveolar Lavage; Respiratory  Result Value Ref Range Status   Specimen Description BRONCHIAL ALVEOLAR LAVAGE  Final   Special Requests NONE  Final   Gram Stain   Final    MODERATE WBC PRESENT, PREDOMINANTLY PMN NO ORGANISMS SEEN    Culture (A)  Final    3,000 COLONIES/mL FUNGUS ISOLATED CALL TO SEND OUT FOR IDENTIFICATION [phone] Performed at [facility], [address]  [place], [place] [address]    Report Status 06/09/2023 FINAL  Final    Lab Basic Metabolic Panel: Recent Labs  Lab 06/07/23 0354 06/07/23 0727 06/09/23 0355 06/09/23 0402 06/20/2023 0348 06/28/2023 0402 06/09/2023 1613 06/14/2023 1621 06-21-2023 0406 06-21-23 0409 06-21-23 1152 06/21/2023 1621 Jun 21, 2023 1849 06/24/2023 0008 06/20/2023 0329  NA 134*   < > 134*   < > 134*   < > 133*   < > 133* 135 135 132*  --  134* 133*  K 4.1   < > 4.1   < > 3.7   < > 4.5   < > 4.8 4.4  normal in size. Pericardium: Trivial pericardial effusion is present. Mitral Valve: The mitral valve is normal in structure. Trivial mitral valve regurgitation. No evidence of mitral valve stenosis. Tricuspid Valve: The tricuspid valve is has been repaired/replaced. Tricuspid valve regurgitation is mild. Aortic Valve: The aortic valve has been repaired/replaced. Aortic valve regurgitation is trivial. No aortic stenosis is present. There is a 25 mm Cryo AV conduit/AA replacement valve present in the aortic position. Procedure Date: 05/13/2023. Pulmonic Valve: The pulmonic valve was normal in structure. Pulmonic valve regurgitation is not visualized. Aorta: The aortic root/ascending aorta has been repaired/replaced. IAS/Shunts: No PFO/ASD by color doppler. Additional Comments: Spectral Doppler performed. TRICUSPID VALVE TR Peak grad:   26.4 mmHg TR Vmax:        257.00 cm/s Dalton McleanMD Electronically signed by Wilfred Lacy Signature Date/Time: 05/23/2023/4:07:34 PM    Final    DG Abd Portable 1V  Result Date: 05/23/2023 CLINICAL DATA:  Feeding tube placement. EXAM: PORTABLE ABDOMEN - 1 VIEW COMPARISON:  None Available. FINDINGS: Tip of the weighted enteric tube in the right upper quadrant in the region of the distal stomach or proximal duodenum. Portions of additional support apparatus are partially included in the field of view. Relative paucity bowel  gas in the included abdomen. IMPRESSION: Tip of the weighted enteric tube in the right upper quadrant in the region of the distal stomach or proximal duodenum. Electronically Signed   By: Narda Rutherford M.D.   On: 05/23/2023 15:05   DG CHEST PORT 1 VIEW  Result Date: 05/23/2023 CLINICAL DATA:  Patient on ECMO EXAM: PORTABLE CHEST 1 VIEW COMPARISON:  Prior chest x-ray 05/22/2023 FINDINGS: The patient is intubated. The tip of the endotracheal tube is 2.2 cm above the carina. ECMO apparatus are in place with 2 large bore cannula overlie the thoracic inlet in unchanged position. There are currently clamped with hemostats. Right IJ approach tunneled hemodialysis catheter with the tip at the cavoatrial junction. Left IJ approach vascular sheath conveys a Swan-Ganz catheter into the heart. The tip of the Swan overlies the main pulmonary outflow tract. Additional ECMO tubing overlies the right atrium. Gastric tube in the stomach. Bilateral chest tubes are in place. Improving pulmonary edema with asymmetric improvement in the left lung versus the right. Persistent near confluent airspace opacity throughout the right lung. No pneumothorax. Stable cardiomegaly. IMPRESSION: 1. Interval change in position of the Swan-Ganz catheter which now overlies the main pulmonary outflow tract. 2. Other support apparatus in stable and satisfactory position. 3. Slightly improved aeration in the left lung with persistent near confluent airspace opacity throughout the right lung. 4. No pneumothorax. Electronically Signed   By: Malachy Moan M.D.   On: 05/23/2023 08:28   DG CHEST PORT 1 VIEW  Result Date: 05/22/2023 CLINICAL DATA:  ECMO EXAM: PORTABLE CHEST - 1 VIEW COMPARISON:  the previous day's study FINDINGS: Endotracheal tube tip less than 2 cm above carina. Gastric tube to the decompressed stomach. Stable right IJ hemodialysis catheter, bilateral chest tubes, right atrial and Left subclavian cannulae. The left IJ Swan-Ganz  is retracted to the right atrium. Diffuse of the earlier opacities throughout the lungs as before. No pneumothorax or significant effusion. Heart size and mediastinal contours are within normal limits. Visualized bones unremarkable. IMPRESSION: 1. Endotracheal tube tip less than 2 cm above carina. 2. Retraction of Swan-Ganz catheter to the right atrium. 3. Diffuse bilateral pulmonary opacities as before. Electronically Signed   By: Corlis Leak M.D.   On: 05/22/2023 08:51  DEATH SUMMARY   Patient Details  Name: [name] MRN: [id number] DOB: 1960/09/16  Admission/Discharge Information   Admit Date:  06/13/23  Date of Death: Date of Death: 2023-07-09  Time of Death: Time of Death: 1200  Length of Stay: 2023-07-28  Referring Physician: Marcine Matar, MD   Reason(s) for Hospitalization  Chest pain  Diagnoses  Preliminary cause of death:  Secondary Diagnoses (including complications and co-morbidities):  Principal Problem:   Cardiogenic shock (HCC) Active Problems:   CAD (coronary artery disease)   Type 2 diabetes mellitus without complication, without long-term current use of insulin (HCC)   OSA on CPAP   Symptomatic anemia   Acute on chronic congestive heart failure (HCC)   Prosthetic aortic valve failure   Acute respiratory failure with hypoxia (HCC)   Anemia   Protein-calorie malnutrition, severe   Brief Hospital Course (including significant findings, care, treatment, and services provided and events leading to death)  [name] is a 62 y.o. year old male with history of hypertension, hypercholesterolemia, type 2 diabetes, end-stage renal disease on hemodialysis until a couple weeks ago. He has a known heart history undergoing coronary artery disease and bicuspid aortic valve with severe aortic insufficiency status post CABG x 4 and aortic valve replacement using a 23 mm Edwards magna-ease pericardial valve by Dr. Laneta Simmers on 11/25/2015. 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 He completed antibiotic treatment for this. [facility] on [facility] 13, 2023 with lower extremity edema and inability to lay flat. Workup showed the patient to be anemic with a hemoglobin level of 6.0. CXR was obtained and showed moderate pulmonary vascular congestion. Echocardiogram was obtained and showed a preserved EF of 50-55% however there was concern for a partial dehiscence of the aortic valve annulus with a  significant perivalvular leak which was new since his last Echo performed in May. Due to this he was transferred to [facility] for further care and evaluation. TEE was obtained and confirmed the presence of a partial dehiscence of the aortic valve prosthesis with 2 perivalvular leaks adjacent to the left and right cusps. There was moderate to severe aortic insufficiency. There was also an echodensity in the subvalvular area of the tricuspid valve annulus with severe tricuspid regurgitation. Cardiothoracic surgery consultation was requested and he was evaluated by Dr. Laneta Simmers who felt the patient would require surgical intervention, however this would be extremely high risk. The risks and benefits of the procedure were explained to the patient and he was agreeable to proceed.   Hospital Course:  The patient was treated with CRRT to optimize volume status prior to going to the operating room.  He was started on broad spectrum antibiotics.  He was taken to the operating room on 06/04/2023.  He underwent Redo Sternotomy, Biologic Bentall using a 28 mm Homograft with Re-implantation of right and left coronary artery, Tricuspid Valve Repair, and Endoscopic harvest of greater saphenous vein from his left leg. He was placed on [place] ECMO prior to leaving the operating room. He tolerated the procedure and was taken to the SICU stable condition. He was started on CRRT to remove volume as tolerated. He had expected postoperative blood loss anemia and thrombocytopenia, he was transfused with 1U of PRBCs and 1U of platelets. He was placed on Vancomycin and Ceftriaxone due to previous MSSA endocarditis. Aortic valve leaflet gram stain showed no WBC and cultures showed NGTD. Hemodynamics were stable on Epi 5, NE 1 and Vaso  normal in size. Pericardium: Trivial pericardial effusion is present. Mitral Valve: The mitral valve is normal in structure. Trivial mitral valve regurgitation. No evidence of mitral valve stenosis. Tricuspid Valve: The tricuspid valve is has been repaired/replaced. Tricuspid valve regurgitation is mild. Aortic Valve: The aortic valve has been repaired/replaced. Aortic valve regurgitation is trivial. No aortic stenosis is present. There is a 25 mm Cryo AV conduit/AA replacement valve present in the aortic position. Procedure Date: 05/13/2023. Pulmonic Valve: The pulmonic valve was normal in structure. Pulmonic valve regurgitation is not visualized. Aorta: The aortic root/ascending aorta has been repaired/replaced. IAS/Shunts: No PFO/ASD by color doppler. Additional Comments: Spectral Doppler performed. TRICUSPID VALVE TR Peak grad:   26.4 mmHg TR Vmax:        257.00 cm/s Dalton McleanMD Electronically signed by Wilfred Lacy Signature Date/Time: 05/23/2023/4:07:34 PM    Final    DG Abd Portable 1V  Result Date: 05/23/2023 CLINICAL DATA:  Feeding tube placement. EXAM: PORTABLE ABDOMEN - 1 VIEW COMPARISON:  None Available. FINDINGS: Tip of the weighted enteric tube in the right upper quadrant in the region of the distal stomach or proximal duodenum. Portions of additional support apparatus are partially included in the field of view. Relative paucity bowel  gas in the included abdomen. IMPRESSION: Tip of the weighted enteric tube in the right upper quadrant in the region of the distal stomach or proximal duodenum. Electronically Signed   By: Narda Rutherford M.D.   On: 05/23/2023 15:05   DG CHEST PORT 1 VIEW  Result Date: 05/23/2023 CLINICAL DATA:  Patient on ECMO EXAM: PORTABLE CHEST 1 VIEW COMPARISON:  Prior chest x-ray 05/22/2023 FINDINGS: The patient is intubated. The tip of the endotracheal tube is 2.2 cm above the carina. ECMO apparatus are in place with 2 large bore cannula overlie the thoracic inlet in unchanged position. There are currently clamped with hemostats. Right IJ approach tunneled hemodialysis catheter with the tip at the cavoatrial junction. Left IJ approach vascular sheath conveys a Swan-Ganz catheter into the heart. The tip of the Swan overlies the main pulmonary outflow tract. Additional ECMO tubing overlies the right atrium. Gastric tube in the stomach. Bilateral chest tubes are in place. Improving pulmonary edema with asymmetric improvement in the left lung versus the right. Persistent near confluent airspace opacity throughout the right lung. No pneumothorax. Stable cardiomegaly. IMPRESSION: 1. Interval change in position of the Swan-Ganz catheter which now overlies the main pulmonary outflow tract. 2. Other support apparatus in stable and satisfactory position. 3. Slightly improved aeration in the left lung with persistent near confluent airspace opacity throughout the right lung. 4. No pneumothorax. Electronically Signed   By: Malachy Moan M.D.   On: 05/23/2023 08:28   DG CHEST PORT 1 VIEW  Result Date: 05/22/2023 CLINICAL DATA:  ECMO EXAM: PORTABLE CHEST - 1 VIEW COMPARISON:  the previous day's study FINDINGS: Endotracheal tube tip less than 2 cm above carina. Gastric tube to the decompressed stomach. Stable right IJ hemodialysis catheter, bilateral chest tubes, right atrial and Left subclavian cannulae. The left IJ Swan-Ganz  is retracted to the right atrium. Diffuse of the earlier opacities throughout the lungs as before. No pneumothorax or significant effusion. Heart size and mediastinal contours are within normal limits. Visualized bones unremarkable. IMPRESSION: 1. Endotracheal tube tip less than 2 cm above carina. 2. Retraction of Swan-Ganz catheter to the right atrium. 3. Diffuse bilateral pulmonary opacities as before. Electronically Signed   By: Corlis Leak M.D.   On: 05/22/2023 08:51  29.6* 32.0* 33.0* 28.1* 30.0* 27.3*  MCV 89.5  --  90.6  --  87.8  --   --  88.9  --  91.3  PLT 63*  --  49*  --  30*  --   --  39*  --  29*   < > = values in this interval not displayed.   Cardiac Enzymes: No results for input(s): "CKTOTAL", "CKMB", "CKMBINDEX", "TROPONINI" in the last 168 hours. Sepsis Labs: Recent Labs  Lab 06/09/23 0401 06/09/23 1654 06/09/2023 0403 06/21/2023 1040 06/30/2023 1613 06/26/2023 0406 06/15/2023 0407 07/03/2023 1621 07/05/2023 0329 06/16/2023 0332  WBC  --    < >  --    < > 62.9* 52.9*  --  63.4* 62.9*  --   LATICACIDVEN 2.3*  --  2.0*  --   --   --  2.7*  --   --  3.2*   < > = values in this interval not displayed.    Procedures/Operations  Redo median Sternotomy Extracorporeal circulation 3.   Aortic root replacement using a 25mm Homograft valve conduit with reimplantation of coronary arteries. 4.  Extension of RCA vein graft with a segment of greater saphenous vein. 5.   Tricuspid valve annuloplasty by Dr. Laneta Simmers on 05/30/2023.   6.Mediastinal washout 7.  Insertion of left femoral arterial line for BP monitoring by Dr. Laneta Simmers on 05-27-2023  8.Conversion to veno-venous ECMO via right internal jugular Crescent cannula. 9. Central decannulation and closure of chest by Dr. Laneta Simmers on 06/05/2023   Ardelle Balls PA-C 06/13/2023, 8:52 AM  89 LV SV Index:   45 LVOT Area:     4.52 cm  LEFT ATRIUM           Index LA Vol (A4C): 54.1 ml 27.21 ml/m  AORTIC VALVE AV Area (Vmax): 1.39 cm AV Vmax:        239.00 cm/s AV Peak Grad:   22.8 mmHg LVOT Vmax:      73.30 cm/s LVOT Vmean:     59.800 cm/s LVOT VTI:       0.196 m  AORTA Ao Root diam: 4.70 cm TRICUSPID VALVE TR Peak grad:   47.1 mmHg TR Vmax:        343.00 cm/s  SHUNTS Systemic VTI:  0.20 m Systemic Diam: 2.40 cm Carolan Clines Electronically signed by Carolan Clines Signature Date/Time: 05/18/2023/3:37:07 PM    Final    DG Chest Port 1 View  Result Date: 05/13/2023 CLINICAL DATA:  SHORTNESS OF BREATH. CHEST PAIN. LOWER  EXTREMITY SWELLING. EXAM: PORTABLE CHEST 1 VIEW COMPARISON:  05/16/2023 FINDINGS: There is a right chest wall dialysis catheter with tips in the distal SVC. Postoperative changes from prior median sternotomy. Heart size appears stable. There is moderate diffuse pulmonary vascular congestion. No frank edema. No pleural fluid or consolidative change. IMPRESSION: Moderate pulmonary vascular congestion. Electronically Signed   By: Signa Kell M.D.   On: 05/21/2023 13:15    Microbiology Recent Results (from the past 240 hour(s))  Blastomyces Antigen     Status: None   Collection Time: 06/03/23  5:47 PM   Specimen: Blood  Result Value Ref Range Status   Blastomyces Antigen None Detected None Detected ng/mL Final    Comment: (NOTE) Reference Interval: None Detected Reportable Range: 0.31 ng/mL - 20.00 ng/mL Results above 20.00 ng/mL are reported as 'Positive, Above the Limit of Quantification' This test was developed and its performance characteristics determined by [employer]. It has not been cleared or approved by the FDA; however, FDA clearance or approval is not currently required for clinical use. The results are not intended to be used as the sole means for clinical diagnosis or patient decisions.    Specimen Type SERUM  Final    Comment: (NOTE) Performed At: [facility] [address] [place], [place] [id number] Roxanne Gates MD WU:[id number]   Culture, BAL-quantitative w Gram Stain     Status: Abnormal   Collection Time: 06/07/23 12:15 PM   Specimen: Bronchoalveolar Lavage; Respiratory  Result Value Ref Range Status   Specimen Description BRONCHIAL ALVEOLAR LAVAGE  Final   Special Requests NONE  Final   Gram Stain   Final    MODERATE WBC PRESENT, PREDOMINANTLY PMN NO ORGANISMS SEEN    Culture (A)  Final    3,000 COLONIES/mL FUNGUS ISOLATED CALL TO SEND OUT FOR IDENTIFICATION [phone] Performed at [facility], [address]  [place], [place] [address]    Report Status 06/09/2023 FINAL  Final    Lab Basic Metabolic Panel: Recent Labs  Lab 06/07/23 0354 06/07/23 0727 06/09/23 0355 06/09/23 0402 06/20/2023 0348 06/28/2023 0402 06/09/2023 1613 06/14/2023 1621 06-21-2023 0406 06-21-23 0409 06-21-23 1152 06/21/2023 1621 Jun 21, 2023 1849 06/24/2023 0008 06/20/2023 0329  NA 134*   < > 134*   < > 134*   < > 133*   < > 133* 135 135 132*  --  134* 133*  K 4.1   < > 4.1   < > 3.7   < > 4.5   < > 4.8 4.4  catheter, right dialysis catheter, ECMO, bilateral chest tubes, NG tube remain in place, unchanged. Prior CABG. Heart normal size. Bilateral airspace disease, most confluent in the right lung unchanged. No effusion or pneumothorax. IMPRESSION: Support devices stable. Stable bilateral airspace disease, right greater than left. Electronically Signed   By: Charlett Nose M.D.   On: 06/01/2023 10:47   DG CHEST PORT 1 VIEW  Result Date: 05/15/2023 CLINICAL DATA:  Chest pain, history of ECMO. EXAM: PORTABLE CHEST 1 VIEW COMPARISON:  Same day. FINDINGS: The heart size and mediastinal contours are within normal limits. Right internal jugular dialysis catheter is again noted. Endotracheal tube is in good position. Left internal jugular catheter is unchanged. Bilateral chest tubes are noted without pneumothorax. Status post coronary bypass graft. ECMO device is noted. Bilateral lung opacities are again noted, right greater than left. The visualized skeletal structures are unremarkable. IMPRESSION: Stable support apparatus and bilateral lung opacities as noted above. Electronically Signed   By: Lupita Raider M.D.   On: 06/06/2023 19:51   DG Abd 1 View  Result Date: 05/27/2023 CLINICAL DATA:  Orogastric tube placement. EXAM: ABDOMEN - 1 VIEW COMPARISON:  May 29, 2023. FINDINGS: Distal tip of nasogastric tube is seen in expected position of proximal stomach. IMPRESSION: Distal tip of nasogastric tube is seen in expected position of proximal stomach. Electronically Signed   By: Lupita Raider M.D.   On: 05/21/2023 19:49   ECHO INTRAOPERATIVE TEE  Result Date: 05/28/2023  *INTRAOPERATIVE TRANSESOPHAGEAL REPORT *  Patient Name:   [name]  Date of Exam: 06/01/2023 Medical Rec #:  [id number]      Height:       66.0  in Accession #:    [id number]     Weight:       186.7 lb Date of Birth:  1961/07/14       BSA:          1.94 m Patient Age:    62 years       BP:           138/80 mmHg Patient Gender: M              HR:           68 bpm. Exam Location:  Anesthesiology Transesophogeal exam was perform intraoperatively during surgical procedure. Patient was closely monitored under general anesthesia during the entirety of examination. Indications:     VA ECMO conversion to VV ECMO Performing Phys: [id number] Alleen Borne Diagnosing Phys: Jairo Ben MD Complications: No known complications during this procedure. POST-OP IMPRESSIONS limited post exchange to VV ECMO _ Left Ventricle: The left ventricular function is essentially unchanged from pre-op exam. Overall EF 50-55%, visually with mild global hypokinesis. _ Right Ventricle: The right ventricular function remains mildly reduced, unchanged from pre-op exam. _ Aortic Valve: The aortic valve homograft appears unchanged from pre-op images. There is trivial regurgitation. _ Mitral Valve: The mitral valve function appears unchanged from pre-op images. _ Tricuspid Valve: There is mild tricuspid regurgitation.  -Right atrium: The old VA cannula has been removed, A large cannula is well positioned in the RA, with turbulent flow evident. PRE-OP FINDINGS  Left Ventricle: The left ventricle has low normal systolic function, with an ejection fraction of 50-55% on VA ECMO. Measured EF 54%. The cavity size was normal. Left ventrical mild global hypokinesis without regional wall motion abnormalities. There is  no left ventricular hypertrophy. Left ventricular diastolic function was not evaluated. Right Ventricle: The  DEATH SUMMARY   Patient Details  Name: [name] MRN: [id number] DOB: 1960/09/16  Admission/Discharge Information   Admit Date:  06/13/23  Date of Death: Date of Death: 2023-07-09  Time of Death: Time of Death: 1200  Length of Stay: 2023-07-28  Referring Physician: Marcine Matar, MD   Reason(s) for Hospitalization  Chest pain  Diagnoses  Preliminary cause of death:  Secondary Diagnoses (including complications and co-morbidities):  Principal Problem:   Cardiogenic shock (HCC) Active Problems:   CAD (coronary artery disease)   Type 2 diabetes mellitus without complication, without long-term current use of insulin (HCC)   OSA on CPAP   Symptomatic anemia   Acute on chronic congestive heart failure (HCC)   Prosthetic aortic valve failure   Acute respiratory failure with hypoxia (HCC)   Anemia   Protein-calorie malnutrition, severe   Brief Hospital Course (including significant findings, care, treatment, and services provided and events leading to death)  [name] is a 62 y.o. year old male with history of hypertension, hypercholesterolemia, type 2 diabetes, end-stage renal disease on hemodialysis until a couple weeks ago. He has a known heart history undergoing coronary artery disease and bicuspid aortic valve with severe aortic insufficiency status post CABG x 4 and aortic valve replacement using a 23 mm Edwards magna-ease pericardial valve by Dr. Laneta Simmers on 11/25/2015. 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 He completed antibiotic treatment for this. He presented to [facility] on Jun 13, 2023 with lower extremity edema and inability to lay flat. Workup showed the patient to be anemic with a hemoglobin level of 6.0. CXR was obtained and showed moderate pulmonary vascular congestion. Echocardiogram was obtained and showed a preserved EF of 50-55% however there was concern for a partial dehiscence of the aortic valve annulus with a  significant perivalvular leak which was new since his last Echo performed in May. Due to this he was transferred to [facility] for further care and evaluation. TEE was obtained and confirmed the presence of a partial dehiscence of the aortic valve prosthesis with 2 perivalvular leaks adjacent to the left and right cusps. There was moderate to severe aortic insufficiency. There was also an echodensity in the subvalvular area of the tricuspid valve annulus with severe tricuspid regurgitation. Cardiothoracic surgery consultation was requested and he was evaluated by Dr. Laneta Simmers who felt the patient would require surgical intervention, however this would be extremely high risk. The risks and benefits of the procedure were explained to the patient and he was agreeable to proceed.   Hospital Course:  The patient was treated with CRRT to optimize volume status prior to going to the operating room.  He was started on broad spectrum antibiotics.  He was taken to the operating room on 06/04/2023.  He underwent Redo Sternotomy, Biologic Bentall using a 28 mm Homograft with Re-implantation of right and left coronary artery, Tricuspid Valve Repair, and Endoscopic harvest of greater saphenous vein from his left leg. He was placed on [place] ECMO prior to leaving the operating room. He tolerated the procedure and was taken to the SICU stable condition. He was started on CRRT to remove volume as tolerated. He had expected postoperative blood loss anemia and thrombocytopenia, he was transfused with 1U of PRBCs and 1U of platelets. He was placed on Vancomycin and Ceftriaxone due to previous MSSA endocarditis. Aortic valve leaflet gram stain showed no WBC and cultures showed NGTD. Hemodynamics were stable on Epi 5, NE 1 and Vaso  catheter, right dialysis catheter, ECMO, bilateral chest tubes, NG tube remain in place, unchanged. Prior CABG. Heart normal size. Bilateral airspace disease, most confluent in the right lung unchanged. No effusion or pneumothorax. IMPRESSION: Support devices stable. Stable bilateral airspace disease, right greater than left. Electronically Signed   By: Charlett Nose M.D.   On: 06/01/2023 10:47   DG CHEST PORT 1 VIEW  Result Date: 05/15/2023 CLINICAL DATA:  Chest pain, history of ECMO. EXAM: PORTABLE CHEST 1 VIEW COMPARISON:  Same day. FINDINGS: The heart size and mediastinal contours are within normal limits. Right internal jugular dialysis catheter is again noted. Endotracheal tube is in good position. Left internal jugular catheter is unchanged. Bilateral chest tubes are noted without pneumothorax. Status post coronary bypass graft. ECMO device is noted. Bilateral lung opacities are again noted, right greater than left. The visualized skeletal structures are unremarkable. IMPRESSION: Stable support apparatus and bilateral lung opacities as noted above. Electronically Signed   By: Lupita Raider M.D.   On: 06/06/2023 19:51   DG Abd 1 View  Result Date: 05/27/2023 CLINICAL DATA:  Orogastric tube placement. EXAM: ABDOMEN - 1 VIEW COMPARISON:  May 29, 2023. FINDINGS: Distal tip of nasogastric tube is seen in expected position of proximal stomach. IMPRESSION: Distal tip of nasogastric tube is seen in expected position of proximal stomach. Electronically Signed   By: Lupita Raider M.D.   On: 05/21/2023 19:49   ECHO INTRAOPERATIVE TEE  Result Date: 05/28/2023  *INTRAOPERATIVE TRANSESOPHAGEAL REPORT *  Patient Name:   [name]  Date of Exam: 06/01/2023 Medical Rec #:  [id number]      Height:       66.0  in Accession #:    [id number]     Weight:       186.7 lb Date of Birth:  1961/07/14       BSA:          1.94 m Patient Age:    62 years       BP:           138/80 mmHg Patient Gender: M              HR:           68 bpm. Exam Location:  Anesthesiology Transesophogeal exam was perform intraoperatively during surgical procedure. Patient was closely monitored under general anesthesia during the entirety of examination. Indications:     VA ECMO conversion to VV ECMO Performing Phys: [id number] Alleen Borne Diagnosing Phys: Jairo Ben MD Complications: No known complications during this procedure. POST-OP IMPRESSIONS limited post exchange to VV ECMO _ Left Ventricle: The left ventricular function is essentially unchanged from pre-op exam. Overall EF 50-55%, visually with mild global hypokinesis. _ Right Ventricle: The right ventricular function remains mildly reduced, unchanged from pre-op exam. _ Aortic Valve: The aortic valve homograft appears unchanged from pre-op images. There is trivial regurgitation. _ Mitral Valve: The mitral valve function appears unchanged from pre-op images. _ Tricuspid Valve: There is mild tricuspid regurgitation.  -Right atrium: The old VA cannula has been removed, A large cannula is well positioned in the RA, with turbulent flow evident. PRE-OP FINDINGS  Left Ventricle: The left ventricle has low normal systolic function, with an ejection fraction of 50-55% on VA ECMO. Measured EF 54%. The cavity size was normal. Left ventrical mild global hypokinesis without regional wall motion abnormalities. There is  no left ventricular hypertrophy. Left ventricular diastolic function was not evaluated. Right Ventricle: The  catheter, right dialysis catheter, ECMO, bilateral chest tubes, NG tube remain in place, unchanged. Prior CABG. Heart normal size. Bilateral airspace disease, most confluent in the right lung unchanged. No effusion or pneumothorax. IMPRESSION: Support devices stable. Stable bilateral airspace disease, right greater than left. Electronically Signed   By: Charlett Nose M.D.   On: 06/01/2023 10:47   DG CHEST PORT 1 VIEW  Result Date: 05/15/2023 CLINICAL DATA:  Chest pain, history of ECMO. EXAM: PORTABLE CHEST 1 VIEW COMPARISON:  Same day. FINDINGS: The heart size and mediastinal contours are within normal limits. Right internal jugular dialysis catheter is again noted. Endotracheal tube is in good position. Left internal jugular catheter is unchanged. Bilateral chest tubes are noted without pneumothorax. Status post coronary bypass graft. ECMO device is noted. Bilateral lung opacities are again noted, right greater than left. The visualized skeletal structures are unremarkable. IMPRESSION: Stable support apparatus and bilateral lung opacities as noted above. Electronically Signed   By: Lupita Raider M.D.   On: 06/06/2023 19:51   DG Abd 1 View  Result Date: 05/27/2023 CLINICAL DATA:  Orogastric tube placement. EXAM: ABDOMEN - 1 VIEW COMPARISON:  May 29, 2023. FINDINGS: Distal tip of nasogastric tube is seen in expected position of proximal stomach. IMPRESSION: Distal tip of nasogastric tube is seen in expected position of proximal stomach. Electronically Signed   By: Lupita Raider M.D.   On: 05/21/2023 19:49   ECHO INTRAOPERATIVE TEE  Result Date: 05/28/2023  *INTRAOPERATIVE TRANSESOPHAGEAL REPORT *  Patient Name:   [name]  Date of Exam: 06/01/2023 Medical Rec #:  [id number]      Height:       66.0  in Accession #:    [id number]     Weight:       186.7 lb Date of Birth:  1961/07/14       BSA:          1.94 m Patient Age:    62 years       BP:           138/80 mmHg Patient Gender: M              HR:           68 bpm. Exam Location:  Anesthesiology Transesophogeal exam was perform intraoperatively during surgical procedure. Patient was closely monitored under general anesthesia during the entirety of examination. Indications:     VA ECMO conversion to VV ECMO Performing Phys: [id number] Alleen Borne Diagnosing Phys: Jairo Ben MD Complications: No known complications during this procedure. POST-OP IMPRESSIONS limited post exchange to VV ECMO _ Left Ventricle: The left ventricular function is essentially unchanged from pre-op exam. Overall EF 50-55%, visually with mild global hypokinesis. _ Right Ventricle: The right ventricular function remains mildly reduced, unchanged from pre-op exam. _ Aortic Valve: The aortic valve homograft appears unchanged from pre-op images. There is trivial regurgitation. _ Mitral Valve: The mitral valve function appears unchanged from pre-op images. _ Tricuspid Valve: There is mild tricuspid regurgitation.  -Right atrium: The old VA cannula has been removed, A large cannula is well positioned in the RA, with turbulent flow evident. PRE-OP FINDINGS  Left Ventricle: The left ventricle has low normal systolic function, with an ejection fraction of 50-55% on VA ECMO. Measured EF 54%. The cavity size was normal. Left ventrical mild global hypokinesis without regional wall motion abnormalities. There is  no left ventricular hypertrophy. Left ventricular diastolic function was not evaluated. Right Ventricle: The  catheter, right dialysis catheter, ECMO, bilateral chest tubes, NG tube remain in place, unchanged. Prior CABG. Heart normal size. Bilateral airspace disease, most confluent in the right lung unchanged. No effusion or pneumothorax. IMPRESSION: Support devices stable. Stable bilateral airspace disease, right greater than left. Electronically Signed   By: Charlett Nose M.D.   On: 06/01/2023 10:47   DG CHEST PORT 1 VIEW  Result Date: 05/15/2023 CLINICAL DATA:  Chest pain, history of ECMO. EXAM: PORTABLE CHEST 1 VIEW COMPARISON:  Same day. FINDINGS: The heart size and mediastinal contours are within normal limits. Right internal jugular dialysis catheter is again noted. Endotracheal tube is in good position. Left internal jugular catheter is unchanged. Bilateral chest tubes are noted without pneumothorax. Status post coronary bypass graft. ECMO device is noted. Bilateral lung opacities are again noted, right greater than left. The visualized skeletal structures are unremarkable. IMPRESSION: Stable support apparatus and bilateral lung opacities as noted above. Electronically Signed   By: Lupita Raider M.D.   On: 06/06/2023 19:51   DG Abd 1 View  Result Date: 05/27/2023 CLINICAL DATA:  Orogastric tube placement. EXAM: ABDOMEN - 1 VIEW COMPARISON:  May 29, 2023. FINDINGS: Distal tip of nasogastric tube is seen in expected position of proximal stomach. IMPRESSION: Distal tip of nasogastric tube is seen in expected position of proximal stomach. Electronically Signed   By: Lupita Raider M.D.   On: 05/21/2023 19:49   ECHO INTRAOPERATIVE TEE  Result Date: 05/28/2023  *INTRAOPERATIVE TRANSESOPHAGEAL REPORT *  Patient Name:   [name]  Date of Exam: 06/01/2023 Medical Rec #:  [id number]      Height:       66.0  in Accession #:    [id number]     Weight:       186.7 lb Date of Birth:  1961/07/14       BSA:          1.94 m Patient Age:    62 years       BP:           138/80 mmHg Patient Gender: M              HR:           68 bpm. Exam Location:  Anesthesiology Transesophogeal exam was perform intraoperatively during surgical procedure. Patient was closely monitored under general anesthesia during the entirety of examination. Indications:     VA ECMO conversion to VV ECMO Performing Phys: [id number] Alleen Borne Diagnosing Phys: Jairo Ben MD Complications: No known complications during this procedure. POST-OP IMPRESSIONS limited post exchange to VV ECMO _ Left Ventricle: The left ventricular function is essentially unchanged from pre-op exam. Overall EF 50-55%, visually with mild global hypokinesis. _ Right Ventricle: The right ventricular function remains mildly reduced, unchanged from pre-op exam. _ Aortic Valve: The aortic valve homograft appears unchanged from pre-op images. There is trivial regurgitation. _ Mitral Valve: The mitral valve function appears unchanged from pre-op images. _ Tricuspid Valve: There is mild tricuspid regurgitation.  -Right atrium: The old VA cannula has been removed, A large cannula is well positioned in the RA, with turbulent flow evident. PRE-OP FINDINGS  Left Ventricle: The left ventricle has low normal systolic function, with an ejection fraction of 50-55% on VA ECMO. Measured EF 54%. The cavity size was normal. Left ventrical mild global hypokinesis without regional wall motion abnormalities. There is  no left ventricular hypertrophy. Left ventricular diastolic function was not evaluated. Right Ventricle: The  DEATH SUMMARY   Patient Details  Name: [name] MRN: [id number] DOB: 1960/09/16  Admission/Discharge Information   Admit Date:  06/13/23  Date of Death: Date of Death: 2023-07-09  Time of Death: Time of Death: 1200  Length of Stay: 2023-07-28  Referring Physician: Marcine Matar, MD   Reason(s) for Hospitalization  Chest pain  Diagnoses  Preliminary cause of death:  Secondary Diagnoses (including complications and co-morbidities):  Principal Problem:   Cardiogenic shock (HCC) Active Problems:   CAD (coronary artery disease)   Type 2 diabetes mellitus without complication, without long-term current use of insulin (HCC)   OSA on CPAP   Symptomatic anemia   Acute on chronic congestive heart failure (HCC)   Prosthetic aortic valve failure   Acute respiratory failure with hypoxia (HCC)   Anemia   Protein-calorie malnutrition, severe   Brief Hospital Course (including significant findings, care, treatment, and services provided and events leading to death)  [name] is a 62 y.o. year old male with history of hypertension, hypercholesterolemia, type 2 diabetes, end-stage renal disease on hemodialysis until a couple weeks ago. He has a known heart history undergoing coronary artery disease and bicuspid aortic valve with severe aortic insufficiency status post CABG x 4 and aortic valve replacement using a 23 mm Edwards magna-ease pericardial valve by Dr. Laneta Simmers on 11/25/2015. 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 He completed antibiotic treatment for this. He presented to [facility] on Jun 13, 2023 with lower extremity edema and inability to lay flat. Workup showed the patient to be anemic with a hemoglobin level of 6.0. CXR was obtained and showed moderate pulmonary vascular congestion. Echocardiogram was obtained and showed a preserved EF of 50-55% however there was concern for a partial dehiscence of the aortic valve annulus with a  significant perivalvular leak which was new since his last Echo performed in May. Due to this he was transferred to [facility] for further care and evaluation. TEE was obtained and confirmed the presence of a partial dehiscence of the aortic valve prosthesis with 2 perivalvular leaks adjacent to the left and right cusps. There was moderate to severe aortic insufficiency. There was also an echodensity in the subvalvular area of the tricuspid valve annulus with severe tricuspid regurgitation. Cardiothoracic surgery consultation was requested and he was evaluated by Dr. Laneta Simmers who felt the patient would require surgical intervention, however this would be extremely high risk. The risks and benefits of the procedure were explained to the patient and he was agreeable to proceed.   Hospital Course:  The patient was treated with CRRT to optimize volume status prior to going to the operating room.  He was started on broad spectrum antibiotics.  He was taken to the operating room on 06/04/2023.  He underwent Redo Sternotomy, Biologic Bentall using a 28 mm Homograft with Re-implantation of right and left coronary artery, Tricuspid Valve Repair, and Endoscopic harvest of greater saphenous vein from his left leg. He was placed on [place] ECMO prior to leaving the operating room. He tolerated the procedure and was taken to the SICU stable condition. He was started on CRRT to remove volume as tolerated. He had expected postoperative blood loss anemia and thrombocytopenia, he was transfused with 1U of PRBCs and 1U of platelets. He was placed on Vancomycin and Ceftriaxone due to previous MSSA endocarditis. Aortic valve leaflet gram stain showed no WBC and cultures showed NGTD. Hemodynamics were stable on Epi 5, NE 1 and Vaso

## 2023-07-08 NOTE — Progress Notes (Signed)
Patient ID: [name], male   DOB: 1960-12-09, 62 y.o.   MRN: [id number]   S:  Seen and examined on CRRT; procedure supervised.  He had 2.4 liters UF over 10/5 with CRRT charted thus far.  Goal UF has been keep even as tolerated.  He was transitioned to 2K for dialysate.  He continues on ECMO.  He had a repeat bronch on 10/5 which demonstrated blood had filled back up left mainstem into the trachea.  Per critical care charting there is a family meeting later today to discuss their recommendations to allow a natural death.  He has been on levo at 31 mcg/min and vaso at 0.04 units/min.  Per RN has been responding to her on the sedation.  Review of systems:   Unable to obtain secondary to intubated and obtunded     O:BP (!) 87/52   Pulse 98   Temp 98.4 F (36.9 C) (Core)   Resp 20   Ht 5\' 6"  (1.676 m)   Wt 71.3 kg   SpO2 100%   BMI 25.37 kg/m   Intake/Output Summary (Last 24 hours) at 06/18/2023 0620 Last data filed at 06/22/2023 0500 Gross per 24 hour  Intake 3574.69 ml  Output 2635 ml  Net 939.69 ml   Intake/Output: I/O last 3 completed shifts: In: 6043.8 [I.V.:4168.7; Blood:915; Other:60; NG/GT:80; IV Piggyback:820.2] Out: 4800.5 [Emesis/NG output:200; Stool:300; Chest Tube:4]  Intake/Output this shift:  Total I/O In: 1297.5 [I.V.:1197.5; IV Piggyback:100] Out: 1194 [Stool:30] Weight change:   General adult male in bed critically ill         HEENT normocephalic atraumatic  Neck supple trachea midline Lungs very decreased or near absent breath sounds  Heart  On ECMO; S1S2  Abdomen soft nontender with limitation of obtunded; nondistended Extremities no pitting edema; decreased skin turgor of feet  Neuro - he is on continuous sedation GU no foley  Access right subclavian tunneled dialysis catheter in place    Recent Labs  Lab 06/06/23 0415 06/06/23 0646 06/07/23 0354 06/07/23 0727 06/08/23 0424 06/08/23 0432 06/09/23 0355 06/09/23 0402 06/09/23 1654  06/09/23 2002 06/24/2023 0348 06/08/2023 0402 06/08/2023 1613 07/05/2023 1621 06/24/2023 1946 06/25/2023 0406 06/18/2023 0409 07/02/2023 1152 06/22/2023 1621 07/05/2023 1849 06/14/2023 0008 06/29/2023 0329  NA 134*   < > 134*   < > 133*   < > 134*   < > 135   < > 134*   < > 133*   < > 136 133* 135 135 132*  --  134* 133*  K 4.1   < > 4.1   < > 4.6   < > 4.1   < > 4.1   < > 3.7   < > 4.5   < > 4.5 4.8 4.4 4.2 5.5* 5.6* 4.6 5.1  CL 99   < > 98   < > 100   < > 98  --  99  --  98  --  96*  --   --  95*  --   --  95*  --   --  95*  CO2 22   < > 21*   < > 22   < > 20*  --  22  --  23  --  23  --   --  24  --   --  24  --   --  23  GLUCOSE 200*   < > 176*   < > 231*   < > 248*  --  283*  --  170*  --  263*  --   --  316*  --   --  324*  --   --  340*  BUN 37*   < > 37*   < > 45*   < > 51*  --  51*  --  50*  --  46*  --   --  54*  --   --  51*  --   --  54*  CREATININE 1.23   < > 1.11   < > 1.16   < > 1.15  --  1.12  --  1.06  --  1.09  --   --  1.09  --   --  1.01  --   --  1.07  ALBUMIN 2.0*   < > 2.0*  2.0*   < > 2.3*  2.2*   < > 2.2*  --  2.4*  --  2.4*  2.4*  --  2.5*  --   --  2.4*  2.4*  --   --  2.4*  --   --  2.3*  CALCIUM 8.7*   < > 8.5*   < > 8.6*   < > 8.5*  --  8.4*  --  8.3*  --  8.5*  --   --  8.4*  --   --  8.1*  --   --  8.2*  PHOS 3.3   < > 3.5   < > 3.9   < > 3.1  --  3.2  --  3.1  --  4.5  --   --  4.1  --   --  4.5  --   --  4.9*  AST 126*  --  109*  --  98*  --  100*  --   --   --  97*  --   --   --   --  79*  --   --   --   --   --   --   ALT 17  --  14  --  10  --  12  --   --   --  15  --   --   --   --  14  --   --   --   --   --   --    < > = values in this interval not displayed.   Liver Function Tests: Recent Labs  Lab 06/09/23 0355 06/09/23 1654 06/16/2023 0348 06/30/2023 1613 06/17/2023 0406 06/22/2023 1621 06/22/2023 0329  AST 100*  --  97*  --  79*  --   --   ALT 12  --  15  --  14  --   --   ALKPHOS 129*  --  136*  --  137*  --   --   BILITOT 4.0*  --  3.4*  --  3.6*  --   --    PROT 8.0  --  7.6  --  7.9  --   --   ALBUMIN 2.2*   < > 2.4*  2.4*   < > 2.4*  2.4* 2.4* 2.3*   < > = values in this interval not displayed.   No results for input(s): "LIPASE", "AMYLASE" in the last 168 hours. No results for input(s): "AMMONIA" in the last 168 hours.  CBC: Recent Labs  Lab 07/03/2023 1040 06/07/2023 1042 07/04/2023 1613 07/03/2023 1621 07/05/2023 0406 06/15/2023 0409 06/14/2023 1621 06/23/2023 0008  06/15/2023 0329  WBC 67.4*  --  62.9*  --  52.9*  --  63.4*  --  62.9*  HGB 8.9*   < > 8.7*   < > 10.2*   < > 9.7* 10.2* 9.6*  HCT 26.5*   < > 26.1*   < > 29.6*   < > 28.1* 30.0* 27.3*  MCV 89.5  --  90.6  --  87.8  --  88.9  --  91.3  PLT 63*  --  49*  --  30*  --  39*  --  29*   < > = values in this interval not displayed.   Cardiac Enzymes: No results for input(s): "CKTOTAL", "CKMB", "CKMBINDEX", "TROPONINI" in the last 168 hours. CBG: Recent Labs  Lab 06/27/2023 0919 06/21/2023 1150 06/18/2023 1650 06/18/2023 0005 06/26/2023 0603  GLUCAP 334* 329* 321* 362* 348*    Iron Studies: No results for input(s): "IRON", "TIBC", "TRANSFERRIN", "FERRITIN" in the last 72 hours. Studies/Results: DG Chest Port 1 View  Result Date: 06/28/2023 CLINICAL DATA:  62 year old male with history of ARDS. EXAM: PORTABLE CHEST 1 VIEW COMPARISON:  Chest x-ray 06/26/2023. FINDINGS: A tracheostomy tube is in place with tip 5.1 cm above the carina. ECMO cannula projecting slightly to the right of the spine extending into the inferior vena cava. Right internal jugular PermCath with tip terminating near the superior cavoatrial junction. A nasogastric tube is seen extending into the stomach, however, the tip of the nasogastric tube extends below the lower margin of the image. Bilateral chest tubes stable in position. Status post median sternotomy for CABG. Near complete opacification of the left hemithorax redemonstrated. Slight improved aeration in the right lung, although there is continued diffuse  interstitial prominence and patchy airspace disease throughout the right lung. Pulmonary vasculature and cardiomediastinal contours are completely obscured. IMPRESSION: 1. Postoperative changes and support apparatus, as above. 2. Improved aeration in the right lung, otherwise, unchanged. Electronically Signed   By: Trudie Reed M.D.   On: 06/17/2023 10:28   DG CHEST PORT 1 VIEW  Result Date: 06/27/2023 CLINICAL DATA:  62 year old male on ECMO. EXAM: PORTABLE CHEST 1 VIEW COMPARISON:  Chest x-ray 06/08/2023. FINDINGS: Tracheostomy tube in position with tip terminating 4.6 cm above the carina. A nasogastric tube is seen extending into the stomach, however, the tip of the nasogastric tube extends below the lower margin of the image. Right internal jugular PermCath with tip terminating in the right atrium. ECMO cannula extending into the inferior vena cava. Bilateral chest tubes stable in position. Lungs are nearly non aerated, markedly worsened compared to the prior study. Pulmonary vasculature and cardiomediastinal structures are now completely obscured. Median sternotomy wires and postoperative changes of prior CABG are noted. IMPRESSION: 1. Postoperative changes and support apparatus, as above. 2. Markedly worsened aeration throughout the lungs bilaterally which now appear nearly completely non aerated. Electronically Signed   By: Trudie Reed M.D.   On: 06/25/2023 10:20   DG CHEST PORT 1 VIEW  Result Date: 07/03/2023 CLINICAL DATA:  ECMO. EXAM: PORTABLE CHEST 1 VIEW COMPARISON:  One-view chest x-ray 06/09/2023 FINDINGS: A right IJ catheter terminates in the IVC. Bilateral chest tubes are in place. A second right IJ catheter terminates at the cavoatrial junction. Central gastric tube is in the stomach. The heart size is normal. Diffuse interstitial and airspace opacities are similar the prior exam. Bilateral pleural effusions, left greater than right are also similar. IMPRESSION: 1. Stable support  apparatus. 2. Stable diffuse interstitial and airspace  opacities. 3. Stable bilateral pleural effusions, left greater than right. Electronically Signed   By: Marin Roberts M.D.   On: 07/06/23 12:20    sodium chloride   Intravenous Once   sodium chloride   Intravenous Once   artificial tears   Both Eyes Q8H   bisacodyl  10 mg Rectal Daily   Chlorhexidine Gluconate Cloth  6 each Topical Daily   darbepoetin (ARANESP) injection - DIALYSIS  40 mcg Subcutaneous Q Wed-1800    HYDROmorphone (DILAUDID) injection  0.5 mg Intravenous Once   insulin aspart  0-15 Units Subcutaneous Q6H   methylPREDNISolone (SOLU-MEDROL) injection  40 mg Intravenous Q24H   Followed by   [name] ON 06/17/2023] methylPREDNISolone (SOLU-MEDROL) injection  20 mg Intravenous Q24H   mouth rinse  15 mL Mouth Rinse Q2H   pantoprazole (PROTONIX) IV  40 mg Intravenous QHS   sodium chloride flush  3 mL Intravenous Q12H   tranexamic acid  500 mg Nebulization Q8H    BMET    Component Value Date/Time   NA 133 (L) 07/01/2023 0329   NA 143 11/02/2022 1454   K 5.1 07/07/2023 0329   CL 95 (L) 06/14/2023 0329   CO2 23 06/20/2023 0329   GLUCOSE 340 (H) 06/19/2023 0329   BUN 54 (H) 07/02/2023 0329   BUN 31 (H) 11/02/2022 1454   CREATININE 1.07 06/29/2023 0329   CREATININE 2.71 (H) 05/14/2023 0950   CREATININE 1.05 02/24/2015 1601   CALCIUM 8.2 (L) 06/08/2023 0329   GFRNONAA >60 06/23/2023 0329   GFRNONAA 26 (L) 05/21/2023 0950   GFRNONAA 80 02/24/2015 1601   GFRAA 62 10/09/2020 1520   GFRAA >89 02/24/2015 1601   CBC    Component Value Date/Time   WBC 62.9 (HH) 06/20/2023 0329   RBC 2.99 (L) 06/29/2023 0329   HGB 9.6 (L) 06/23/2023 0329   HGB 6.4 (LL) 05/25/2023 0852   HGB 10.2 (L) 08/12/2022 0934   HCT 27.3 (L) 07/07/2023 0329   HCT 29.2 (L) 08/12/2022 0934   PLT 29 (LL) 06/28/2023 0329   PLT 109 (L) 05/08/2023 0852   PLT 252 08/12/2022 0934   MCV 91.3 06/26/2023 0329   MCV 94 08/12/2022 0934   MCH 32.1  06/08/2023 0329   MCHC 35.2 06/27/2023 0329   RDW 16.9 (H) 07/01/2023 0329   RDW 15.5 (H) 08/12/2022 0934   LYMPHSABS 5.8 (H) 05/29/2023 1722   LYMPHSABS 1.9 11/04/2021 1028   MONOABS 6.3 (H) 05/29/2023 1722   EOSABS 0.0 05/29/2023 1722   EOSABS 0.2 11/04/2021 1028   BASOSABS 0.0 05/29/2023 1722   BASOSABS 0.1 11/04/2021 1028    Assessment/Plan:   Anuric AKI/dialysis dependent AKI with fluid overload: Started on RRT during previous admission in May 2024 until 05/10/23 (recovered). Patient is status post cardiac surgery on 9/13 and currently on ECMO.  CRRT start 9/11. No signs of renal recovery.   Continue CRRT - he is on ECMO.  UF is keep even as tolerated.  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  Per CTS Hemoptysis - s/p bronch; invasive fungal infection; felt to be at high risk for recurrence.  Happened again and had another bronch. Family meeting on 10/6 to discuss goals of care Shock: multifactorial including cardiogenic  and septic, on pressors and ECMO per primary team. Right lung consolidation. 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 ECMO. Fungal PNA. Abx/antifungal per primary service and ID   Anemia, thrombocytopenia: Transfuse PRBC's and/or platelets as needed per primary team.  Started aranesp 40 mcg every Wednesday on 10/2 Hypophosphatemia - repletion prn  Disposition - in ICU on CRRT, ECMO.  Critically ill and note plans for family meeting today.  Appreciate all teams supporting the patient and his family at this time   Estanislado Emms, MD 6:34 AM 06/20/2023

## 2023-07-08 NOTE — Progress Notes (Signed)
PHARMACY - TOTAL PARENTERAL NUTRITION CONSULT NOTE   Indication:  unable to tolerate tube feeding  Patient Measurements: Height: 5\' 6"  (167.6 cm) Weight: 71.9 kg (158 lb 8.2 oz) IBW/kg (Calculated) : 63.8 TPN AdjBW (KG): 69 Body mass index is 25.58 kg/m. Usual Weight: ~90 kg  Assessment:  62 yo male with significant PMH for HLD, HTN, T2DM, CKD4 (prev on HD) and recent MSSA bacteremia + endocarditis (May 2024). Back in May 2024 admission, underwent ERCP for cholangitis/pancreatitis. Admitted on 9/11 w/ chest pain found to have aortic valve dehiscence. S/p aortic valve replacement redo on 9/13 - developed pulmonary edema post op and was placed on [place] ECMO, transitioned to VV ECMO 9/24. S/p mediastinal washout on 9/19 - chest still open. Unable to speak w/ patient due to intubation (utilizing Precedex for sedation). Pharmacy consulted to manage TPN for inability to tolerate tube feeding and malnutrition.   Glucose / Insulin: hx T2DM - A1c 6.1%, on Farxiga PTA. BG 316-329 up (trending down). Used 16 units SSI in 24 hours. 45 units insulin regular in bag -10/1 started steroid course, plan for 2 week taper starting 10/4 Electrolytes: K 5.1 (trending down), Mg 2.6 trending up (none in TPN), CoCa ~9.6/iCa 1.13, others wnl Renal: Hx CKD4 on CRRT since 9/11 (required HD in the past), 4K bags, systemic bivalirudin for anticoag - not using systemic sodium citrate infusion, only for catheter lock per d/w RN at bedside on 9/24; BUN 50 Hepatic: Alk phos 136, AST 97, ALT wnl, Tbili 4, albumin 2.2, TG 188  Intake / Output:  Stool 160 mL, NG 200 mL, Chest tube 4 mL, CRRT removed 3097 mL (keeping even)  GI Imaging:  9/27 CT A/P : mild wall thickening in several small bowel loops suggesting enteritis, cholelithiasis GI Surgeries / Procedures:  9/24 transition to VV ECMO 9/20 Cortrak placed  9/22 NGT placed  Central access: 9/13 TPN start date: 9/23  Nutritional Goals: Goal concentrated TPN rate is 75  mL/hr (provides 140 g of protein and 2201 kcals per day)  RD Assessment: Estimated Needs Total Energy Estimated Needs: 2200-2400 Total Protein Estimated Needs: 130-150 grams Total Fluid Estimated Needs: 1L plus UOP  Current Nutrition:  NPO and TPN  Plan:  Transitioned to comfort care this morning, TPN discontinued.  Thank you for allowing pharmacy to be a part of this patient's care.  Thelma Barge, PharmD Clinical Pharmacist

## 2023-07-08 NOTE — Progress Notes (Signed)
NAME:  [name], MRN:  [id number], DOB:  Jun 01, 1961, LOS: 25 ADMISSION DATE:  25-May-2023, CONSULTATION DATE:  9/11 REFERRING MD:  Dr. Izora Ribas, CHIEF COMPLAINT:  aortic valve dehiscence   History of Present Illness:  Patient is [name] w/ pertinent PMH CAD s/p CABG 2017 w/ AVR, HLD, HTN, prior CVA, T2DM, CKD4 was previously on HD presents to [facility] on 2023-05-25 chest pain.  Patient recently admitted to [facility] on 5/25 w/ AKI and AMS. While in ED patient had PEA arrest w/ ROSC in about 8 minutes. Patient required dialysis on this admission. Also w/ MSSA bacteremia associated w/ tricuspid valve endocarditis. Patient transferred to [facility] on 5/25. Treated w/ 6 week course oxacillin and rifampin. This admission also suffered a R cerebellar CVA w/ MRI likely septic emboli and had cholangitis/pancreatitis requiring ERCP.  On 2023/05/25 patient admitted to [facility] w/ chest pain, dyspnea, and BLE edema. BNP 2,097. CXR w/ pulm edema. Patient started on IV lasix infusion. Cards consulted. On 9/11 patient transferred to [facility]. Patient's echo showing possible aortic valve dehiscence. Cultures repeated and started on rocephin. Patient transferred to ICU for TEE and general anesthesia and to start CRRT. PCCM consulted.  Pertinent  Medical History   Past Medical History:  Diagnosis Date   Allergy    Anemia    Anxiety    Baker's cyst of knee    Blood transfusion without reported diagnosis    as baby    Coronary artery disease    quadruple bypass - March 2016   GERD (gastroesophageal reflux disease)    Gouty arthritis    "real bad" (01/17/2013)   Heart murmur    Hypercholesteremia    Hypertension    MSSA bacteremia 01/29/2023   Myocardial infarction (HCC) 2017   PEA (Pulseless electrical activity) (HCC) 01/29/2023   Stroke (HCC)    Type II diabetes mellitus (HCC)     Significant Hospital Events: Including procedures, antibiotic start and stop dates in addition to other pertinent events    25-May-2023 admitted 9/11 echo showing aortic valve dehiscence, confirmed by TEE.  9/11 started (back) on CRRT for volume overload and known CKD 4 9/12 breathing better after CRRT 9/13 to OR for Redo of aortic valve, tricuspid valve repair and ascending aortic root replacement w/ re-implantation of SVG to RCA. Post op TEE EF 55% RV mid-mod HK, AVR/TV repair stable. Could not come off CPB. Worse pulmonary edema. High dose pressors. Cannulated and started on [place] ECMO w/ central cannulation  9/16 MDT ECMO rounds this AM, plans to remove more volume, 1 U PRBCs  9/18 bronch, clot and mucus plugs removed  9/19 OR for washout  9/20 bronch 9/21 bronch 9/24 VA to VV, some bleeding and acidemia issues postop 9/25 ett exchange and bronch 10/1 trach 10/4 cough>>airway bleeding from left, bronch, recurrent airway bleeding 10/5 repeat airway bleed bronch; recurrence noted in afternoon 10/6 family meeting planned  Interim History / Subjective:  No events. Gtt dilaudid 4, levo 31, vaso 0.04, TPN  Objective   Blood pressure (!) 87/52, pulse (!) 106, temperature 98.4 F (36.9 C), temperature source Core, resp. rate 20, height 5\' 6"  (1.676 m), weight 71.9 kg, SpO2 100%. CVP:  [2 mmHg-12 mmHg] 9 mmHg  Vent Mode: PCV FiO2 (%):  [50 %] 50 % Set Rate:  [20 bmp] 20 bmp PEEP:  [10 cmH20] 10 cmH20 Plateau Pressure:  [28 cmH20-30 cmH20] 30 cmH20   Intake/Output Summary (Last 24 hours) at 07/07/2023  1610 Last data filed at 06/15/2023 0700 Gross per 24 hour  Intake 3707.76 ml  Output 2794 ml  Net 913.76 ml   Filed Weights   06/09/2023 0500 06/25/2023 0600 06/28/2023 0600  Weight: 72.6 kg 71.3 kg 71.9 kg    Examination: Sedated, comfortable appearing Sternotomy site dressed without strikethrough Diminished breath sounds bilaterally Heart sounds regular ECMO cannula/circuit look fine Abd soft, absent bowel sounds Ext warm  CBC/BMP/CXR reviewed Sugars are up Plts down a bit WBC remains up Lactate creeping  up  Assessment & Plan:  Post cardiotomy vasoplegia/cardioplegia w/ failure to wean from CPB- improved; 06-18-2023 converted to VV Acute on chronic combined HF, Hx MSSA endocarditis- S/p AVR/Bentall and reimplantation of Coronaries and TV repair 9/13 Acute respiratory failure w/ hypoxia due to volume overload, with untreated OSA, and dense nonresolving R infiltrate growing aspergillus terreus; now complicated by refractory airway bleeding CKD 4. Had recently been on HD- now on CRRT Circuit related hemolysis, thrombocytopenia consumptive- stable Hyperbilirubinemia TF intolerance- ongoing issue now on TPN x 2 weeks CAD s/p CABG in 2017 w/ AVR HTN HLD Hx of CVA DMT2 Leukocytosis, multifactorial   He has had no gut function for 3 weeks, no improvement in pneumonitis for similar time period, and is getting weaker each day.  Unfortunately despite aggressive care, his body continues to languish and I think we are now at point where further interventions will only prolong his suffering.  Family is coming in at 9am to discuss but I think it's time to consider allow a peaceful passing for Mr. [name].  Continue current care plan in interim.  Best Practice (right click and "Reselect all SmartList Selections" daily)   Diet/type: TPN DVT prophylaxis: off for airway bleeding GI prophylaxis: PPI Lines: Central line, Dialysis Catheter, and Arterial Line Foley: yes Code Status:  full code Last date of multidisciplinary goals of care discussion [per TCTS and AHF]   This patient is critically ill with multiple organ system failure which requires frequent high complexity decision making, assessment, support, evaluation, and titration of therapies. This was completed through the application of advanced monitoring technologies and extensive interpretation of multiple databases. During this encounter critical care time was devoted to patient care services described in this note for 34 minutes.  Lorin Glass, MD  06/09/2023 7:12 AM [facility]  For contact information, see Amion. If no response to pager, please call PCCM consult pager. After hours, 7PM- 7AM, please call Elink.

## 2023-07-08 NOTE — Progress Notes (Addendum)
Daily Progress Note   Patient Name: [name]       Date: 07/05/2023 DOB: 1960/11/28  Age: 62 y.o. MRN#: [id number] Attending Physician: Alleen Borne, MD Primary Care Physician: Marcine Matar, MD Admit Date: 06/01/2023  Reason for Consultation/Follow-up: Patient is on ECMO  Length of Stay: 25  Current Medications: Scheduled Meds:   sodium chloride   Intravenous Once   sodium chloride   Intravenous Once   artificial tears   Both Eyes Q8H   bisacodyl  10 mg Rectal Daily   Chlorhexidine Gluconate Cloth  6 each Topical Daily   darbepoetin (ARANESP) injection - DIALYSIS  40 mcg Subcutaneous Q Wed-1800    HYDROmorphone (DILAUDID) injection  0.5 mg Intravenous Once   insulin aspart  0-15 Units Subcutaneous Q4H   insulin aspart  4 Units Subcutaneous Q4H   methylPREDNISolone (SOLU-MEDROL) injection  40 mg Intravenous Q24H   Followed by   [name] ON 06/17/2023] methylPREDNISolone (SOLU-MEDROL) injection  20 mg Intravenous Q24H   mouth rinse  15 mL Mouth Rinse Q2H   pantoprazole (PROTONIX) IV  40 mg Intravenous QHS   sodium chloride flush  3 mL Intravenous Q12H   tranexamic acid  500 mg Nebulization Q8H    Continuous Infusions:   prismasol BGK 4/2.5 400 mL/hr at 06/24/2023 2111    prismasol BGK 4/2.5 400 mL/hr at 07/03/2023 0001   sodium chloride Stopped (05/24/23 0032)   sodium chloride Stopped (06/06/23 0237)   albumin human Stopped (05/30/23 0819)   anticoagulant sodium citrate      ceFAZolin (ANCEF) IV Stopped (06/24/2023 2143)   epinephrine Stopped (06/07/23 2327)   HYDROmorphone 4 mg/hr (06/22/2023 0900)   micafungin (MYCAMINE) 200 mg in sodium chloride 0.9 % 100 mL IVPB Stopped (06/23/2023 1838)   norepinephrine (LEVOPHED) Adult infusion 31 mcg/min (06/16/2023 0900)   prismasol BGK  2/2.5 dialysis solution 1,500 mL/hr at 06/09/2023 0824   TPN ADULT (ION) 75 mL/hr at 06/25/2023 0900   vasopressin 0.04 Units/min (06/26/2023 0900)    PRN Meds: sodium chloride, sodium chloride, acetaminophen, acetaminophen, albumin human, anticoagulant sodium citrate, artificial tears, dextrose, diphenhydrAMINE **OR** diphenhydrAMINE, HYDROmorphone, HYDROmorphone (DILAUDID) injection, meperidine (DEMEROL) injection, midazolam, mouth rinse, oxidized cellulose, sodium chloride flush  Physical Exam Constitutional:      Appearance: He is ill-appearing.     Interventions: He is intubated.  Comments: ECMO  Pulmonary:     Effort: He is intubated.             Vital Signs: BP (!) 87/52   Pulse (!) 103   Temp 98.4 F (36.9 C) (Core)   Resp (!) 0   Ht 5\' 6"  (1.676 m)   Wt 71.9 kg   SpO2 100%   BMI 25.58 kg/m  SpO2: SpO2: 100 % O2 Device: O2 Device: Ventilator O2 Flow Rate: O2 Flow Rate (L/min): 4 L/min   Patient Active Problem List   Diagnosis Date Noted   Cardiogenic shock (HCC) 06/01/2023   Protein-calorie malnutrition, severe 05/28/2023   Cardiogenic postoperative shock (HCC) 05/09/2023   Acute on chronic combined systolic and diastolic CHF (congestive heart failure) (HCC) 05/18/2023   Prosthetic aortic valve failure 05/18/2023   Acute respiratory failure with hypoxia (HCC) 05/18/2023   Anemia 05/18/2023   Volume overload 05/11/2023   Acute on chronic congestive heart failure (HCC) 05/28/2023   ESRD (end stage renal disease) on dialysis (HCC) 04/11/2023   Endocarditis of tricuspid valve 03/30/2023   Medication management 03/30/2023   Symptomatic anemia 02/26/2023   Allergy, unspecified, initial encounter 02/25/2023   Anaphylactic shock, unspecified, initial encounter 02/25/2023   Dependence on renal dialysis (HCC) 02/15/2023   Chronic kidney disease, stage 3 unspecified (HCC) 02/15/2023   Hypertensive chronic kidney disease with stage 1 through stage 4 chronic kidney  disease, or unspecified chronic kidney disease 02/15/2023   Personal history of transient ischemic attack (TIA), and cerebral infarction without residual deficits 02/15/2023   Sepsis (HCC) 01/29/2023   TTP (thrombotic thrombocytopenic purpura) (HCC) 01/29/2023   MSSA bacteremia 01/29/2023   PEA (Pulseless electrical activity) (HCC) 01/29/2023   Severe sepsis with septic shock (HCC) 01/29/2023   Prosthetic valve endocarditis (HCC) 01/29/2023   Influenza vaccine refused 10/09/2020   OSA on CPAP 02/06/2019   Macroalbuminuric diabetic nephropathy (HCC) 02/06/2019   TIA (transient ischemic attack) 04/10/2018   Colon cancer screening 05/04/2017   Cerebral infarction (HCC) 12/11/2015   Acute ischemic VBA thalamic stroke (HCC)    Atherosclerosis of native coronary artery of native heart without angina pectoris    H/O heart bypass surgery    Benign essential HTN    Type 2 diabetes mellitus without complication, without long-term current use of insulin (HCC)    S/P AVR 11/25/2015   CAD (coronary artery disease)    Chronic periodontitis 11/21/2015   Bicuspid aortic valve    Controlled type 2 diabetes mellitus with diabetic nephropathy, without long-term current use of insulin (HCC)    Essential hypertension 02/24/2015   Dyslipidemia 02/24/2015   GERD (gastroesophageal reflux disease) 05/02/2013   Primary gout 05/02/2013   Allergic rhinitis due to allergen 07/09/2010   Class 1 obesity 07/08/2008    Palliative Care Assessment & Plan   Patient Profile: 62 y.o. male  with past medical history of CAD s/p CABG 2017 w/ AVR (magna ease for bicuspid valve disease), HLD, HTN, prior CVA, T2DM, CKD4 was previously on HD admitted to [facility] on 06/02/2023 for evaluation of chest pain.    He was found to have an aortic valve dehiscence confirmed by TEE on 05/18/23. Started CRRT for volume overload that day. 05/30/2023 he went to OR for redo of aortic valve, tricuspid valve repair and ascending aortic root  replacement with re-implantation of SVG to RCA; could not come off CPB, cannulated and started on VA ECMO with central cannulation   Recent admission on 5/25 with AKI and AMS,  had PEA arrest with ROSC in 8 minutes, required dialysis, also with MSSA bacteremia associated with tricuspid valve endocarditis, suffered right cerebellar CVA and had cholangitis/pancreatitis requiring ERCP.   Significant Hospital events/per CCM   9/10 admitted 9/11 echo showing aortic valve dehiscence, confirmed by TEE.  9/11 started (back) on CRRT for volume overload and known CKD 4 9/12 breathing better after CRRT 9/13 to OR for Redo of aortic valve, tricuspid valve repair and ascending aortic root replacement w/ re-implantation of SVG to RCA. Post op TEE EF 55% RV mid-mod HK, AVR/TV repair stable. Could not come off CPB. Worse pulmonary edema. High dose pressors. Cannulated and started on VA ECMO w/ central cannulation  9/16 MDT ECMO rounds this AM, plans to remove more volume, 1 U PRBCs  9/18 bronch, clot and mucus plugs removed  11-Jun-2023 OR for washout 9/20 bronch 9/21 bronch 9/24 VA to VV, some bleeding and acidemia issues postop 9/25 ett exchange and bronch 10/1 trach 10/4 bronch  Subjective: Patient remains critically ill with multiorgan system failure. Family meeting today with Dr. Myrla Halsted. Dr. Katrinka Blazing shared that despite the ongoing medical interventions the patient remains critically ill with little meaningful improvement. The patient's son [name] states the patient would not want to suffer and he plans to allow a peaceful passing for his father. Other family members were in agreement. Emotional support offered. Chaplain service offered- family will let staff know if this is wanted.  They will let medical team know when they are ready to transition the patient to comfort.  Recommendations/Plan: Plan to transition patient to comfort when family is ready. PMT support- as needed   Code Status:    Code  Status Orders  (From admission, onward)           Start     Ordered   05/21/23 0806  Full code  Continuous       Comments: Full code with ACLS Protocol WITH NO CHEST COMPRESSIONS  Question:  By:  Answer:  Consent: discussion documented in EHR   05/21/23 0806        Extensive chart review has been completed  including labs, vital signs, imaging, progress/consult notes, orders, medications and available advance directive documents.  Discussed with nursing staff at bedside   Care plan was discussed with bedside RN and Dr. Katrinka Blazing  Time spent: 50 minutes  Thank you for allowing the Palliative Medicine Team to assist in the care of this patient.   Sherryll Burger, NP  Please contact Palliative Medicine Team phone at [phone] for questions and concerns.

## 2023-07-08 NOTE — Progress Notes (Signed)
Patient ID: [name], male   DOB: 1960/11/06, 62 y.o.   MRN: [id number] Extracorporeal support note   ECLS support day: 12 (V-V) (Day 24 of ECLS support) Indication: Acute hypoxic respiratory failure  Configuration: Crescent VV ECMO  30FR Dual-lumen Crescent cannula in RIJ   Pump speed: 3600 rpm  Pump flow: 3.8 L/min Pump used: Cardiohelp  Sweep gas: 7  ABG: 7.39/42/132  Circuit check: small clot noted Anticoagulant: Holding anticoagulation Anticoagulation targets:  N/A  Changes in support: none  Anticipated goals/duration of support:  - Family meeting, likely to withdraw care today.    [name]   07/02/2023, 7:25 AM

## 2023-07-08 NOTE — Progress Notes (Signed)
RT NOTE: PT taken off of ventilator per order as patient is transitioning to comfort care. Family at bedside with patient at this time.

## 2023-07-08 DEATH — deceased

## 2023-08-02 ENCOUNTER — Ambulatory Visit: Payer: PPO | Admitting: Cardiovascular Disease
